# Patient Record
Sex: Female | Born: 1950 | Race: Black or African American | Hispanic: No | Marital: Married | Smoking: Former smoker
Health system: Southern US, Community
[De-identification: ages and names within clinical notes are randomized; demographics above are authoritative.]

## PROBLEM LIST (undated history)

## (undated) DIAGNOSIS — I10 Essential (primary) hypertension: Secondary | ICD-10-CM

## (undated) DIAGNOSIS — N189 Chronic kidney disease, unspecified: Secondary | ICD-10-CM

## (undated) DIAGNOSIS — E039 Hypothyroidism, unspecified: Secondary | ICD-10-CM

## (undated) DIAGNOSIS — E119 Type 2 diabetes mellitus without complications: Secondary | ICD-10-CM

## (undated) DIAGNOSIS — G43009 Migraine without aura, not intractable, without status migrainosus: Secondary | ICD-10-CM

## (undated) DIAGNOSIS — D759 Disease of blood and blood-forming organs, unspecified: Secondary | ICD-10-CM

## (undated) DIAGNOSIS — F519 Sleep disorder not due to a substance or known physiological condition, unspecified: Secondary | ICD-10-CM

## (undated) DIAGNOSIS — Z973 Presence of spectacles and contact lenses: Secondary | ICD-10-CM

## (undated) DIAGNOSIS — N3090 Cystitis, unspecified without hematuria: Secondary | ICD-10-CM

## (undated) DIAGNOSIS — F3289 Other specified depressive episodes: Secondary | ICD-10-CM

## (undated) DIAGNOSIS — F329 Major depressive disorder, single episode, unspecified: Secondary | ICD-10-CM

## (undated) DIAGNOSIS — R059 Cough, unspecified: Secondary | ICD-10-CM

## (undated) DIAGNOSIS — R05 Cough: Secondary | ICD-10-CM

## (undated) DIAGNOSIS — M549 Dorsalgia, unspecified: Secondary | ICD-10-CM

## (undated) DIAGNOSIS — F419 Anxiety disorder, unspecified: Secondary | ICD-10-CM

## (undated) DIAGNOSIS — I499 Cardiac arrhythmia, unspecified: Secondary | ICD-10-CM

## (undated) DIAGNOSIS — K219 Gastro-esophageal reflux disease without esophagitis: Secondary | ICD-10-CM

## (undated) DIAGNOSIS — G8929 Other chronic pain: Secondary | ICD-10-CM

## (undated) DIAGNOSIS — K3184 Gastroparesis: Secondary | ICD-10-CM

## (undated) DIAGNOSIS — K5730 Diverticulosis of large intestine without perforation or abscess without bleeding: Secondary | ICD-10-CM

## (undated) DIAGNOSIS — D509 Iron deficiency anemia, unspecified: Secondary | ICD-10-CM

## (undated) DIAGNOSIS — E785 Hyperlipidemia, unspecified: Secondary | ICD-10-CM

## (undated) DIAGNOSIS — K449 Diaphragmatic hernia without obstruction or gangrene: Secondary | ICD-10-CM

## (undated) DIAGNOSIS — I2510 Atherosclerotic heart disease of native coronary artery without angina pectoris: Secondary | ICD-10-CM

## (undated) DIAGNOSIS — M1990 Unspecified osteoarthritis, unspecified site: Secondary | ICD-10-CM

## (undated) DIAGNOSIS — M5412 Radiculopathy, cervical region: Secondary | ICD-10-CM

## (undated) HISTORY — DX: Diaphragmatic hernia without obstruction or gangrene: K44.9

## (undated) HISTORY — DX: Radiculopathy, cervical region: M54.12

## (undated) HISTORY — DX: Unspecified osteoarthritis, unspecified site: M19.90

## (undated) HISTORY — DX: Anxiety disorder, unspecified: F41.9

## (undated) HISTORY — PX: BREAST EXCISIONAL BIOPSY: SUR124

## (undated) HISTORY — DX: Major depressive disorder, single episode, unspecified: F32.9

## (undated) HISTORY — PX: ABDOMINAL HYSTERECTOMY: SHX81

## (undated) HISTORY — DX: Sleep disorder not due to a substance or known physiological condition, unspecified: F51.9

## (undated) HISTORY — DX: Hyperlipidemia, unspecified: E78.5

## (undated) HISTORY — DX: Iron deficiency anemia, unspecified: D50.9

## (undated) HISTORY — DX: Type 2 diabetes mellitus without complications: E11.9

## (undated) HISTORY — DX: Gastro-esophageal reflux disease without esophagitis: K21.9

## (undated) HISTORY — DX: Hypothyroidism, unspecified: E03.9

## (undated) HISTORY — DX: Gastroparesis: K31.84

## (undated) HISTORY — DX: Other specified depressive episodes: F32.89

## (undated) HISTORY — DX: Migraine without aura, not intractable, without status migrainosus: G43.009

## (undated) HISTORY — PX: ESOPHAGOGASTRODUODENOSCOPY: SHX1529

## (undated) HISTORY — PX: COLONOSCOPY: SHX174

## (undated) HISTORY — PX: EYE SURGERY: SHX253

## (undated) HISTORY — PX: JOINT REPLACEMENT: SHX530

## (undated) HISTORY — DX: Atherosclerotic heart disease of native coronary artery without angina pectoris: I25.10

## (undated) HISTORY — DX: Diverticulosis of large intestine without perforation or abscess without bleeding: K57.30

## (undated) HISTORY — DX: Cystitis, unspecified without hematuria: N30.90

## (undated) HISTORY — PX: OTHER SURGICAL HISTORY: SHX169

## (undated) HISTORY — DX: Essential (primary) hypertension: I10

---

## 1998-02-14 ENCOUNTER — Observation Stay (HOSPITAL_COMMUNITY): Admission: EM | Admit: 1998-02-14 | Discharge: 1998-02-15 | Payer: Self-pay | Admitting: *Deleted

## 1999-02-12 ENCOUNTER — Ambulatory Visit: Admission: RE | Admit: 1999-02-12 | Discharge: 1999-02-12 | Payer: Self-pay | Admitting: Endocrinology

## 1999-10-03 ENCOUNTER — Ambulatory Visit (HOSPITAL_COMMUNITY): Admission: RE | Admit: 1999-10-03 | Discharge: 1999-10-03 | Payer: Self-pay | Admitting: Endocrinology

## 1999-10-03 ENCOUNTER — Encounter: Payer: Self-pay | Admitting: Endocrinology

## 1999-12-31 ENCOUNTER — Encounter: Payer: Self-pay | Admitting: Family Medicine

## 1999-12-31 ENCOUNTER — Inpatient Hospital Stay (HOSPITAL_COMMUNITY): Admission: AD | Admit: 1999-12-31 | Discharge: 2000-01-02 | Payer: Self-pay | Admitting: Family Medicine

## 2000-01-01 ENCOUNTER — Encounter: Payer: Self-pay | Admitting: Family Medicine

## 2000-07-23 ENCOUNTER — Encounter: Payer: Self-pay | Admitting: Orthopedic Surgery

## 2000-07-23 ENCOUNTER — Encounter: Admission: RE | Admit: 2000-07-23 | Discharge: 2000-07-23 | Payer: Self-pay | Admitting: Orthopedic Surgery

## 2001-02-20 ENCOUNTER — Observation Stay (HOSPITAL_COMMUNITY): Admission: EM | Admit: 2001-02-20 | Discharge: 2001-02-21 | Payer: Self-pay | Admitting: Internal Medicine

## 2001-02-20 ENCOUNTER — Encounter: Payer: Self-pay | Admitting: Internal Medicine

## 2002-01-21 ENCOUNTER — Ambulatory Visit: Admission: RE | Admit: 2002-01-21 | Discharge: 2002-01-21 | Payer: Self-pay | Admitting: Orthopedic Surgery

## 2002-02-18 ENCOUNTER — Encounter: Payer: Self-pay | Admitting: Orthopedic Surgery

## 2002-02-23 ENCOUNTER — Inpatient Hospital Stay (HOSPITAL_COMMUNITY): Admission: RE | Admit: 2002-02-23 | Discharge: 2002-03-03 | Payer: Self-pay | Admitting: Orthopedic Surgery

## 2002-02-27 ENCOUNTER — Encounter: Payer: Self-pay | Admitting: Orthopedic Surgery

## 2004-04-13 ENCOUNTER — Encounter: Admission: RE | Admit: 2004-04-13 | Discharge: 2004-04-13 | Payer: Self-pay | Admitting: Orthopedic Surgery

## 2004-05-01 ENCOUNTER — Encounter: Admission: RE | Admit: 2004-05-01 | Discharge: 2004-05-01 | Payer: Self-pay | Admitting: Orthopedic Surgery

## 2004-06-20 ENCOUNTER — Encounter: Admission: RE | Admit: 2004-06-20 | Discharge: 2004-06-20 | Payer: Self-pay | Admitting: Orthopedic Surgery

## 2004-12-04 ENCOUNTER — Ambulatory Visit: Payer: Self-pay | Admitting: *Deleted

## 2004-12-18 ENCOUNTER — Ambulatory Visit: Payer: Self-pay

## 2004-12-25 ENCOUNTER — Ambulatory Visit: Payer: Self-pay

## 2005-01-16 ENCOUNTER — Encounter: Admission: RE | Admit: 2005-01-16 | Discharge: 2005-01-16 | Payer: Self-pay | Admitting: Orthopedic Surgery

## 2005-01-31 ENCOUNTER — Encounter: Admission: RE | Admit: 2005-01-31 | Discharge: 2005-01-31 | Payer: Self-pay | Admitting: Orthopedic Surgery

## 2005-02-27 ENCOUNTER — Encounter: Admission: RE | Admit: 2005-02-27 | Discharge: 2005-02-27 | Payer: Self-pay | Admitting: Orthopedic Surgery

## 2005-10-11 ENCOUNTER — Ambulatory Visit: Payer: Self-pay | Admitting: Internal Medicine

## 2005-11-18 ENCOUNTER — Ambulatory Visit: Payer: Self-pay | Admitting: Internal Medicine

## 2005-12-04 ENCOUNTER — Ambulatory Visit: Payer: Self-pay | Admitting: Endocrinology

## 2005-12-25 ENCOUNTER — Ambulatory Visit: Payer: Self-pay | Admitting: Endocrinology

## 2006-01-02 ENCOUNTER — Ambulatory Visit (HOSPITAL_BASED_OUTPATIENT_CLINIC_OR_DEPARTMENT_OTHER): Admission: RE | Admit: 2006-01-02 | Discharge: 2006-01-02 | Payer: Self-pay | Admitting: Orthopedic Surgery

## 2006-01-16 ENCOUNTER — Ambulatory Visit: Payer: Self-pay | Admitting: Internal Medicine

## 2006-01-24 ENCOUNTER — Ambulatory Visit (HOSPITAL_COMMUNITY): Admission: RE | Admit: 2006-01-24 | Discharge: 2006-01-24 | Payer: Self-pay | Admitting: Internal Medicine

## 2006-04-23 ENCOUNTER — Ambulatory Visit: Payer: Self-pay | Admitting: Internal Medicine

## 2006-07-01 ENCOUNTER — Ambulatory Visit: Payer: Self-pay | Admitting: Internal Medicine

## 2006-07-21 ENCOUNTER — Ambulatory Visit: Payer: Self-pay | Admitting: Internal Medicine

## 2006-10-30 ENCOUNTER — Ambulatory Visit: Payer: Self-pay | Admitting: Internal Medicine

## 2006-11-04 ENCOUNTER — Ambulatory Visit: Payer: Self-pay | Admitting: Family Medicine

## 2006-11-21 ENCOUNTER — Ambulatory Visit: Payer: Self-pay | Admitting: Vascular Surgery

## 2006-12-15 ENCOUNTER — Ambulatory Visit: Payer: Self-pay | Admitting: Internal Medicine

## 2006-12-17 ENCOUNTER — Encounter: Payer: Self-pay | Admitting: Internal Medicine

## 2006-12-17 LAB — CONVERTED CEMR LAB
Metaneph Total, Ur: 223 ug/24hr (ref 95–475)
Metanephrines, Ur: 52 (ref 19–140)
Normetanephrine, 24H Ur: 171 (ref 52–310)
Protein, Ur: 75 mg/24hr (ref 50–100)

## 2007-02-02 ENCOUNTER — Ambulatory Visit (HOSPITAL_COMMUNITY): Admission: RE | Admit: 2007-02-02 | Discharge: 2007-02-02 | Payer: Self-pay | Admitting: Obstetrics

## 2007-02-04 ENCOUNTER — Encounter: Admission: RE | Admit: 2007-02-04 | Discharge: 2007-02-04 | Payer: Self-pay | Admitting: Obstetrics

## 2007-02-24 ENCOUNTER — Inpatient Hospital Stay (HOSPITAL_BASED_OUTPATIENT_CLINIC_OR_DEPARTMENT_OTHER): Admission: RE | Admit: 2007-02-24 | Discharge: 2007-02-24 | Payer: Self-pay | Admitting: Cardiology

## 2007-03-25 ENCOUNTER — Ambulatory Visit: Payer: Self-pay | Admitting: Internal Medicine

## 2007-04-15 ENCOUNTER — Inpatient Hospital Stay (HOSPITAL_COMMUNITY): Admission: RE | Admit: 2007-04-15 | Discharge: 2007-04-20 | Payer: Self-pay | Admitting: Orthopedic Surgery

## 2007-05-06 DIAGNOSIS — I10 Essential (primary) hypertension: Secondary | ICD-10-CM

## 2007-05-06 DIAGNOSIS — E119 Type 2 diabetes mellitus without complications: Secondary | ICD-10-CM | POA: Insufficient documentation

## 2007-05-06 DIAGNOSIS — F411 Generalized anxiety disorder: Secondary | ICD-10-CM | POA: Insufficient documentation

## 2007-05-06 DIAGNOSIS — I152 Hypertension secondary to endocrine disorders: Secondary | ICD-10-CM | POA: Insufficient documentation

## 2007-05-06 DIAGNOSIS — E785 Hyperlipidemia, unspecified: Secondary | ICD-10-CM | POA: Insufficient documentation

## 2007-05-06 DIAGNOSIS — E1159 Type 2 diabetes mellitus with other circulatory complications: Secondary | ICD-10-CM | POA: Insufficient documentation

## 2007-05-06 DIAGNOSIS — I2510 Atherosclerotic heart disease of native coronary artery without angina pectoris: Secondary | ICD-10-CM | POA: Insufficient documentation

## 2007-08-13 ENCOUNTER — Encounter: Payer: Self-pay | Admitting: Internal Medicine

## 2007-08-13 DIAGNOSIS — G43009 Migraine without aura, not intractable, without status migrainosus: Secondary | ICD-10-CM | POA: Insufficient documentation

## 2007-08-13 DIAGNOSIS — D649 Anemia, unspecified: Secondary | ICD-10-CM | POA: Insufficient documentation

## 2007-08-13 DIAGNOSIS — Z96659 Presence of unspecified artificial knee joint: Secondary | ICD-10-CM | POA: Insufficient documentation

## 2007-08-13 DIAGNOSIS — D539 Nutritional anemia, unspecified: Secondary | ICD-10-CM | POA: Insufficient documentation

## 2007-08-13 DIAGNOSIS — N318 Other neuromuscular dysfunction of bladder: Secondary | ICD-10-CM | POA: Insufficient documentation

## 2007-09-01 ENCOUNTER — Ambulatory Visit: Payer: Self-pay | Admitting: Internal Medicine

## 2007-09-01 DIAGNOSIS — R1032 Left lower quadrant pain: Secondary | ICD-10-CM | POA: Insufficient documentation

## 2007-09-01 DIAGNOSIS — F519 Sleep disorder not due to a substance or known physiological condition, unspecified: Secondary | ICD-10-CM | POA: Insufficient documentation

## 2007-09-01 DIAGNOSIS — F329 Major depressive disorder, single episode, unspecified: Secondary | ICD-10-CM | POA: Insufficient documentation

## 2007-09-01 DIAGNOSIS — F32A Depression, unspecified: Secondary | ICD-10-CM | POA: Insufficient documentation

## 2007-09-04 LAB — CONVERTED CEMR LAB
ALT: 31 units/L (ref 0–35)
AST: 19 units/L (ref 0–37)
Albumin: 3.3 g/dL — ABNORMAL LOW (ref 3.5–5.2)
Alkaline Phosphatase: 121 units/L — ABNORMAL HIGH (ref 39–117)
BUN: 23 mg/dL (ref 6–23)
Basophils Absolute: 0 10*3/uL (ref 0.0–0.1)
Basophils Relative: 0 % (ref 0.0–1.0)
Bilirubin Urine: NEGATIVE
Bilirubin, Direct: 0.1 mg/dL (ref 0.0–0.3)
CO2: 25 meq/L (ref 19–32)
Calcium: 9.4 mg/dL (ref 8.4–10.5)
Chloride: 105 meq/L (ref 96–112)
Cholesterol: 223 mg/dL (ref 0–200)
Creatinine, Ser: 1.1 mg/dL (ref 0.4–1.2)
Crystals: NEGATIVE
Direct LDL: 153 mg/dL
Eosinophils Absolute: 0.4 10*3/uL (ref 0.0–0.6)
Eosinophils Relative: 3.1 % (ref 0.0–5.0)
GFR calc Af Amer: 66 mL/min
GFR calc non Af Amer: 54 mL/min
Glucose, Bld: 147 mg/dL — ABNORMAL HIGH (ref 70–99)
HCT: 26.8 % — ABNORMAL LOW (ref 36.0–46.0)
HDL: 49.1 mg/dL (ref >39.0)
Hemoglobin, Urine: NEGATIVE
Hemoglobin: 9.1 g/dL — ABNORMAL LOW (ref 12.0–15.0)
Hgb A1c MFr Bld: 6.3 % — ABNORMAL HIGH (ref 4.6–6.0)
Ketones, ur: NEGATIVE mg/dL
Leukocytes, UA: NEGATIVE
Lipase: 33 units/L (ref 11.0–59.0)
Lymphocytes Relative: 22.4 % (ref 12.0–46.0)
MCHC: 34.1 g/dL (ref 30.0–36.0)
MCV: 91.6 fL (ref 78.0–100.0)
Monocytes Absolute: 0.3 10*3/uL (ref 0.2–0.7)
Monocytes Relative: 2.5 % — ABNORMAL LOW (ref 3.0–11.0)
Mucus, UA: NEGATIVE
Neutro Abs: 9.1 10*3/uL — ABNORMAL HIGH (ref 1.4–7.7)
Neutrophils Relative %: 72 % (ref 43.0–77.0)
Nitrite: NEGATIVE
Platelets: 367 10*3/uL (ref 150–400)
Potassium: 4.4 meq/L (ref 3.5–5.1)
RBC / HPF: NONE SEEN
RBC: 2.93 M/uL — ABNORMAL LOW (ref 3.87–5.11)
RDW: 12.6 % (ref 11.5–14.6)
Sodium: 139 meq/L (ref 135–145)
Specific Gravity, Urine: 1.02 (ref 1.000–1.03)
Total Bilirubin: 0.7 mg/dL (ref 0.3–1.2)
Total CHOL/HDL Ratio: 4.5
Total Protein, Urine: NEGATIVE mg/dL
Total Protein: 6.7 g/dL (ref 6.0–8.3)
Triglycerides: 146 mg/dL (ref 0–149)
Urine Glucose: NEGATIVE mg/dL
Urobilinogen, UA: 0.2 (ref 0.0–1.0)
VLDL: 29 mg/dL (ref 0–40)
WBC: 12.6 10*3/uL — ABNORMAL HIGH (ref 4.5–10.5)
pH: 6 (ref 5.0–8.0)

## 2007-09-07 ENCOUNTER — Ambulatory Visit: Payer: Self-pay | Admitting: Internal Medicine

## 2007-09-08 ENCOUNTER — Encounter (HOSPITAL_COMMUNITY): Admission: RE | Admit: 2007-09-08 | Discharge: 2007-10-16 | Payer: Self-pay | Admitting: Internal Medicine

## 2007-09-08 LAB — CONVERTED CEMR LAB
Basophils Absolute: 0.1 10*3/uL (ref 0.0–0.1)
Basophils Relative: 0.7 % (ref 0.0–1.0)
Eosinophils Absolute: 0.5 10*3/uL (ref 0.0–0.6)
Eosinophils Relative: 4.4 % (ref 0.0–5.0)
HCT: 23.2 % — CL (ref 36.0–46.0)
Hemoglobin: 7.8 g/dL — CL (ref 12.0–15.0)
Iron: 33 ug/dL — ABNORMAL LOW (ref 42–145)
Lymphocytes Relative: 23.3 % (ref 12.0–46.0)
MCHC: 33.5 g/dL (ref 30.0–36.0)
MCV: 92.6 fL (ref 78.0–100.0)
Monocytes Absolute: 0.3 10*3/uL (ref 0.2–0.7)
Monocytes Relative: 2.7 % — ABNORMAL LOW (ref 3.0–11.0)
Neutro Abs: 8 10*3/uL — ABNORMAL HIGH (ref 1.4–7.7)
Neutrophils Relative %: 68.9 % (ref 43.0–77.0)
Platelets: 417 10*3/uL — ABNORMAL HIGH (ref 150–400)
RBC: 2.51 M/uL — ABNORMAL LOW (ref 3.87–5.11)
RDW: 13.1 % (ref 11.5–14.6)
Transferrin: 286.3 mg/dL (ref >=212.0)
Vitamin B-12: 521 pg/mL (ref 211–911)
WBC: 11.6 10*3/uL — ABNORMAL HIGH (ref 4.5–10.5)

## 2007-09-14 ENCOUNTER — Telehealth (INDEPENDENT_AMBULATORY_CARE_PROVIDER_SITE_OTHER): Payer: Self-pay | Admitting: *Deleted

## 2007-09-14 ENCOUNTER — Encounter: Payer: Self-pay | Admitting: Internal Medicine

## 2007-09-14 ENCOUNTER — Ambulatory Visit: Payer: Self-pay | Admitting: Gastroenterology

## 2007-09-15 ENCOUNTER — Ambulatory Visit: Payer: Self-pay | Admitting: Cardiology

## 2007-09-17 ENCOUNTER — Encounter: Payer: Self-pay | Admitting: Internal Medicine

## 2007-09-17 ENCOUNTER — Ambulatory Visit: Payer: Self-pay | Admitting: Gastroenterology

## 2007-09-23 ENCOUNTER — Ambulatory Visit (HOSPITAL_COMMUNITY): Admission: RE | Admit: 2007-09-23 | Discharge: 2007-09-23 | Payer: Self-pay | Admitting: Gastroenterology

## 2007-09-23 ENCOUNTER — Encounter: Payer: Self-pay | Admitting: Gastroenterology

## 2007-09-23 ENCOUNTER — Encounter: Payer: Self-pay | Admitting: Internal Medicine

## 2007-09-29 ENCOUNTER — Ambulatory Visit: Payer: Self-pay | Admitting: Gastroenterology

## 2007-10-02 ENCOUNTER — Encounter: Admission: RE | Admit: 2007-10-02 | Discharge: 2007-10-02 | Payer: Self-pay | Admitting: Surgery

## 2007-10-07 ENCOUNTER — Encounter: Payer: Self-pay | Admitting: Internal Medicine

## 2007-10-09 ENCOUNTER — Emergency Department (HOSPITAL_COMMUNITY): Admission: EM | Admit: 2007-10-09 | Discharge: 2007-10-10 | Payer: Self-pay | Admitting: Emergency Medicine

## 2007-10-12 ENCOUNTER — Ambulatory Visit: Payer: Self-pay | Admitting: Internal Medicine

## 2007-10-12 ENCOUNTER — Encounter (HOSPITAL_COMMUNITY): Admission: RE | Admit: 2007-10-12 | Discharge: 2007-10-16 | Payer: Self-pay | Admitting: Cardiology

## 2007-10-12 DIAGNOSIS — T148XXA Other injury of unspecified body region, initial encounter: Secondary | ICD-10-CM | POA: Insufficient documentation

## 2007-10-12 DIAGNOSIS — T07XXXA Unspecified multiple injuries, initial encounter: Secondary | ICD-10-CM | POA: Insufficient documentation

## 2007-10-12 DIAGNOSIS — M25519 Pain in unspecified shoulder: Secondary | ICD-10-CM | POA: Insufficient documentation

## 2007-10-12 DIAGNOSIS — M25469 Effusion, unspecified knee: Secondary | ICD-10-CM | POA: Insufficient documentation

## 2007-10-28 ENCOUNTER — Encounter: Payer: Self-pay | Admitting: Internal Medicine

## 2007-11-08 HISTORY — PX: OTHER SURGICAL HISTORY: SHX169

## 2007-11-27 ENCOUNTER — Ambulatory Visit: Payer: Self-pay | Admitting: Internal Medicine

## 2007-11-27 ENCOUNTER — Encounter (INDEPENDENT_AMBULATORY_CARE_PROVIDER_SITE_OTHER): Payer: Self-pay | Admitting: Surgery

## 2007-11-27 ENCOUNTER — Inpatient Hospital Stay (HOSPITAL_COMMUNITY): Admission: RE | Admit: 2007-11-27 | Discharge: 2007-12-11 | Payer: Self-pay | Admitting: Surgery

## 2007-12-07 ENCOUNTER — Ambulatory Visit: Payer: Self-pay | Admitting: Gastroenterology

## 2007-12-21 ENCOUNTER — Ambulatory Visit: Payer: Self-pay | Admitting: Internal Medicine

## 2007-12-21 DIAGNOSIS — R1084 Generalized abdominal pain: Secondary | ICD-10-CM | POA: Insufficient documentation

## 2007-12-21 LAB — CONVERTED CEMR LAB
ALT: 18 units/L (ref 0–35)
AST: 18 units/L (ref 0–37)
Albumin: 3.6 g/dL (ref 3.5–5.2)
Alkaline Phosphatase: 115 units/L (ref 39–117)
BUN: 7 mg/dL (ref 6–23)
Basophils Absolute: 0.1 10*3/uL (ref 0.0–0.1)
Basophils Relative: 0.8 % (ref 0.0–1.0)
Bilirubin Urine: NEGATIVE
Bilirubin, Direct: 0.2 mg/dL (ref 0.0–0.3)
CO2: 29 meq/L (ref 19–32)
Calcium: 9.7 mg/dL (ref 8.4–10.5)
Chloride: 105 meq/L (ref 96–112)
Creatinine, Ser: 1 mg/dL (ref 0.4–1.2)
Eosinophils Absolute: 0.3 10*3/uL (ref 0.0–0.6)
Eosinophils Relative: 3.8 % (ref 0.0–5.0)
GFR calc Af Amer: 73 mL/min
GFR calc non Af Amer: 61 mL/min
Glucose, Bld: 180 mg/dL — ABNORMAL HIGH (ref 70–99)
HCT: 30.9 % — ABNORMAL LOW (ref 36.0–46.0)
Hemoglobin, Urine: NEGATIVE
Hemoglobin: 10 g/dL — ABNORMAL LOW (ref 12.0–15.0)
Ketones, ur: NEGATIVE mg/dL
Leukocytes, UA: NEGATIVE
Lymphocytes Relative: 27.8 % (ref 12.0–46.0)
MCHC: 32.5 g/dL (ref 30.0–36.0)
MCV: 93.2 fL (ref 78.0–100.0)
Monocytes Absolute: 0.3 10*3/uL (ref 0.2–0.7)
Monocytes Relative: 4.1 % (ref 3.0–11.0)
Neutro Abs: 5.1 10*3/uL (ref 1.4–7.7)
Neutrophils Relative %: 63.5 % (ref 43.0–77.0)
Nitrite: NEGATIVE
Platelets: 424 10*3/uL — ABNORMAL HIGH (ref 150–400)
Potassium: 4 meq/L (ref 3.5–5.1)
RBC: 3.31 M/uL — ABNORMAL LOW (ref 3.87–5.11)
RDW: 15.5 % — ABNORMAL HIGH (ref 11.5–14.6)
Sodium: 141 meq/L (ref 135–145)
Specific Gravity, Urine: 1.015 (ref 1.000–1.03)
Total Bilirubin: 1 mg/dL (ref 0.3–1.2)
Total Protein, Urine: NEGATIVE mg/dL
Total Protein: 7.7 g/dL (ref 6.0–8.3)
Urine Glucose: NEGATIVE mg/dL
Urobilinogen, UA: 0.2 (ref 0.0–1.0)
WBC: 8 10*3/uL (ref 4.5–10.5)
pH: 7 (ref 5.0–8.0)

## 2007-12-29 ENCOUNTER — Encounter: Payer: Self-pay | Admitting: Internal Medicine

## 2007-12-30 ENCOUNTER — Encounter: Payer: Self-pay | Admitting: Internal Medicine

## 2008-01-05 ENCOUNTER — Ambulatory Visit: Payer: Self-pay | Admitting: Gastroenterology

## 2008-01-28 ENCOUNTER — Encounter: Admission: RE | Admit: 2008-01-28 | Discharge: 2008-01-28 | Payer: Self-pay | Admitting: Orthopedic Surgery

## 2008-02-01 ENCOUNTER — Encounter: Admission: RE | Admit: 2008-02-01 | Discharge: 2008-02-01 | Payer: Self-pay | Admitting: Surgery

## 2008-03-07 ENCOUNTER — Encounter: Payer: Self-pay | Admitting: Internal Medicine

## 2008-03-10 ENCOUNTER — Encounter: Payer: Self-pay | Admitting: Gastroenterology

## 2008-04-06 HISTORY — PX: OTHER SURGICAL HISTORY: SHX169

## 2008-04-13 ENCOUNTER — Telehealth: Payer: Self-pay | Admitting: Gastroenterology

## 2008-04-15 ENCOUNTER — Ambulatory Visit (HOSPITAL_COMMUNITY): Admission: RE | Admit: 2008-04-15 | Discharge: 2008-04-15 | Payer: Self-pay | Admitting: Gastroenterology

## 2008-04-18 ENCOUNTER — Encounter: Payer: Self-pay | Admitting: Gastroenterology

## 2008-04-21 ENCOUNTER — Encounter: Payer: Self-pay | Admitting: Internal Medicine

## 2008-04-21 ENCOUNTER — Encounter: Payer: Self-pay | Admitting: Gastroenterology

## 2008-04-25 ENCOUNTER — Inpatient Hospital Stay (HOSPITAL_COMMUNITY): Admission: RE | Admit: 2008-04-25 | Discharge: 2008-04-29 | Payer: Self-pay | Admitting: Orthopedic Surgery

## 2008-05-10 ENCOUNTER — Telehealth (INDEPENDENT_AMBULATORY_CARE_PROVIDER_SITE_OTHER): Payer: Self-pay | Admitting: *Deleted

## 2008-06-17 ENCOUNTER — Telehealth (INDEPENDENT_AMBULATORY_CARE_PROVIDER_SITE_OTHER): Payer: Self-pay | Admitting: *Deleted

## 2008-06-29 ENCOUNTER — Ambulatory Visit: Payer: Self-pay | Admitting: Internal Medicine

## 2008-06-29 DIAGNOSIS — N959 Unspecified menopausal and perimenopausal disorder: Secondary | ICD-10-CM | POA: Insufficient documentation

## 2008-07-11 ENCOUNTER — Encounter: Payer: Self-pay | Admitting: Internal Medicine

## 2008-07-14 ENCOUNTER — Ambulatory Visit: Payer: Self-pay | Admitting: Internal Medicine

## 2008-07-15 ENCOUNTER — Encounter: Payer: Self-pay | Admitting: Internal Medicine

## 2008-08-17 ENCOUNTER — Encounter: Admission: RE | Admit: 2008-08-17 | Discharge: 2008-08-17 | Payer: Self-pay | Admitting: Surgery

## 2008-09-23 ENCOUNTER — Encounter: Payer: Self-pay | Admitting: Internal Medicine

## 2008-10-28 ENCOUNTER — Ambulatory Visit: Payer: Self-pay | Admitting: Internal Medicine

## 2008-10-28 DIAGNOSIS — M5412 Radiculopathy, cervical region: Secondary | ICD-10-CM | POA: Insufficient documentation

## 2008-11-21 ENCOUNTER — Encounter: Payer: Self-pay | Admitting: Internal Medicine

## 2008-12-05 ENCOUNTER — Encounter (INDEPENDENT_AMBULATORY_CARE_PROVIDER_SITE_OTHER): Payer: Self-pay | Admitting: *Deleted

## 2008-12-12 ENCOUNTER — Encounter: Admission: RE | Admit: 2008-12-12 | Discharge: 2008-12-12 | Payer: Self-pay | Admitting: Orthopedic Surgery

## 2008-12-12 ENCOUNTER — Encounter: Admission: RE | Admit: 2008-12-12 | Discharge: 2008-12-12 | Payer: Self-pay | Admitting: Internal Medicine

## 2008-12-12 ENCOUNTER — Encounter: Payer: Self-pay | Admitting: Internal Medicine

## 2008-12-15 ENCOUNTER — Encounter: Payer: Self-pay | Admitting: Internal Medicine

## 2009-01-02 ENCOUNTER — Encounter: Payer: Self-pay | Admitting: Internal Medicine

## 2009-01-05 HISTORY — PX: ROTATOR CUFF REPAIR: SHX139

## 2009-02-13 ENCOUNTER — Ambulatory Visit: Payer: Self-pay | Admitting: Internal Medicine

## 2009-02-14 LAB — CONVERTED CEMR LAB
ALT: 29 units/L (ref 0–35)
AST: 33 units/L (ref 0–37)
Albumin: 3.4 g/dL — ABNORMAL LOW (ref 3.5–5.2)
Alkaline Phosphatase: 90 units/L (ref 39–117)
BUN: 33 mg/dL — ABNORMAL HIGH (ref 6–23)
Basophils Absolute: 0 10*3/uL (ref 0.0–0.1)
Basophils Relative: 0.2 % (ref 0.0–3.0)
Bilirubin Urine: NEGATIVE
Bilirubin, Direct: 0.7 mg/dL — ABNORMAL HIGH (ref 0.0–0.3)
CO2: 24 meq/L (ref 19–32)
Calcium: 9.2 mg/dL (ref 8.4–10.5)
Chloride: 111 meq/L (ref 96–112)
Cholesterol: 204 mg/dL — ABNORMAL HIGH (ref 0–200)
Creatinine, Ser: 1.2 mg/dL (ref 0.4–1.2)
Creatinine,U: 149.1 mg/dL
Direct LDL: 139.2 mg/dL
Eosinophils Absolute: 0.3 10*3/uL (ref 0.0–0.7)
Eosinophils Relative: 3 % (ref 0.0–5.0)
GFR calc non Af Amer: 59.14 mL/min (ref >60)
Glucose, Bld: 177 mg/dL — ABNORMAL HIGH (ref 70–99)
HCT: 31.1 % — ABNORMAL LOW (ref 36.0–46.0)
HDL: 48.3 mg/dL (ref >39.00)
Hemoglobin, Urine: NEGATIVE
Hemoglobin: 10.3 g/dL — ABNORMAL LOW (ref 12.0–15.0)
Hgb A1c MFr Bld: 6.8 % — ABNORMAL HIGH (ref 4.6–6.5)
Ketones, ur: NEGATIVE mg/dL
Leukocytes, UA: NEGATIVE
Lymphocytes Relative: 26 % (ref 12.0–46.0)
Lymphs Abs: 2.2 10*3/uL (ref 0.7–4.0)
MCHC: 33.1 g/dL (ref 30.0–36.0)
MCV: 93.8 fL (ref 78.0–100.0)
Microalb Creat Ratio: 2 mg/g (ref 0.0–30.0)
Microalb, Ur: 0.3 mg/dL (ref 0.0–1.9)
Monocytes Absolute: 0.3 10*3/uL (ref 0.1–1.0)
Monocytes Relative: 3.1 % (ref 3.0–12.0)
Neutro Abs: 5.8 10*3/uL (ref 1.4–7.7)
Neutrophils Relative %: 67.7 % (ref 43.0–77.0)
Nitrite: NEGATIVE
Platelets: 219 10*3/uL (ref 150.0–400.0)
Potassium: 4.9 meq/L (ref 3.5–5.1)
RBC: 3.31 M/uL — ABNORMAL LOW (ref 3.87–5.11)
RDW: 12.5 % (ref 11.5–14.6)
Sodium: 146 meq/L — ABNORMAL HIGH (ref 135–145)
Specific Gravity, Urine: 1.025 (ref 1.000–1.030)
TSH: 0.77 microintl units/mL (ref 0.35–5.50)
Total Bilirubin: 1.4 mg/dL — ABNORMAL HIGH (ref 0.3–1.2)
Total CHOL/HDL Ratio: 4
Total Protein, Urine: NEGATIVE mg/dL
Total Protein: 6.9 g/dL (ref 6.0–8.3)
Triglycerides: 94 mg/dL (ref 0.0–149.0)
Urine Glucose: NEGATIVE mg/dL
Urobilinogen, UA: 0.2 (ref 0.0–1.0)
VLDL: 18.8 mg/dL (ref 0.0–40.0)
WBC: 8.6 10*3/uL (ref 4.5–10.5)
pH: 6 (ref 5.0–8.0)

## 2009-02-23 ENCOUNTER — Encounter: Payer: Self-pay | Admitting: Internal Medicine

## 2009-03-10 ENCOUNTER — Telehealth: Payer: Self-pay | Admitting: Gastroenterology

## 2009-03-15 ENCOUNTER — Ambulatory Visit: Payer: Self-pay | Admitting: Internal Medicine

## 2009-03-15 DIAGNOSIS — R12 Heartburn: Secondary | ICD-10-CM | POA: Insufficient documentation

## 2009-03-15 DIAGNOSIS — R110 Nausea: Secondary | ICD-10-CM | POA: Insufficient documentation

## 2009-03-15 DIAGNOSIS — R1013 Epigastric pain: Secondary | ICD-10-CM | POA: Insufficient documentation

## 2009-03-15 DIAGNOSIS — K259 Gastric ulcer, unspecified as acute or chronic, without hemorrhage or perforation: Secondary | ICD-10-CM | POA: Insufficient documentation

## 2009-03-15 DIAGNOSIS — R112 Nausea with vomiting, unspecified: Secondary | ICD-10-CM | POA: Insufficient documentation

## 2009-03-15 DIAGNOSIS — K5730 Diverticulosis of large intestine without perforation or abscess without bleeding: Secondary | ICD-10-CM | POA: Insufficient documentation

## 2009-03-15 LAB — CONVERTED CEMR LAB
BUN: 18 mg/dL (ref 6–23)
Basophils Absolute: 0 10*3/uL (ref 0.0–0.1)
Basophils Relative: 0.2 % (ref 0.0–3.0)
CO2: 24 meq/L (ref 19–32)
Calcium: 8.8 mg/dL (ref 8.4–10.5)
Chloride: 114 meq/L — ABNORMAL HIGH (ref 96–112)
Creatinine, Ser: 0.9 mg/dL (ref 0.4–1.2)
Eosinophils Absolute: 0.2 10*3/uL (ref 0.0–0.7)
Eosinophils Relative: 3.4 % (ref 0.0–5.0)
GFR calc non Af Amer: 82.4 mL/min (ref >60)
Glucose, Bld: 191 mg/dL — ABNORMAL HIGH (ref 70–99)
HCT: 31.5 % — ABNORMAL LOW (ref 36.0–46.0)
Hemoglobin: 10.7 g/dL — ABNORMAL LOW (ref 12.0–15.0)
Lymphocytes Relative: 26.4 % (ref 12.0–46.0)
Lymphs Abs: 1.8 10*3/uL (ref 0.7–4.0)
MCHC: 34.1 g/dL (ref 30.0–36.0)
MCV: 95.4 fL (ref 78.0–100.0)
Monocytes Absolute: 0.3 10*3/uL (ref 0.1–1.0)
Monocytes Relative: 4.1 % (ref 3.0–12.0)
Neutro Abs: 4.7 10*3/uL (ref 1.4–7.7)
Neutrophils Relative %: 65.9 % (ref 43.0–77.0)
Platelets: 280 10*3/uL (ref 150.0–400.0)
Potassium: 4 meq/L (ref 3.5–5.1)
RBC: 3.31 M/uL — ABNORMAL LOW (ref 3.87–5.11)
RDW: 12.9 % (ref 11.5–14.6)
Sodium: 147 meq/L — ABNORMAL HIGH (ref 135–145)
WBC: 7 10*3/uL (ref 4.5–10.5)

## 2009-03-16 ENCOUNTER — Ambulatory Visit: Payer: Self-pay | Admitting: Internal Medicine

## 2009-03-17 LAB — CONVERTED CEMR LAB
Ferritin: 21.5 ng/mL (ref 10.0–291.0)
Folate: 9.7 ng/mL
Iron: 65 ug/dL (ref 42–145)
Saturation Ratios: 18.8 % — ABNORMAL LOW (ref 20.0–50.0)
Transferrin: 246.8 mg/dL (ref 212.0–360.0)
Vitamin B-12: 556 pg/mL (ref 211–911)

## 2009-04-26 ENCOUNTER — Ambulatory Visit: Payer: Self-pay | Admitting: Gastroenterology

## 2009-04-26 DIAGNOSIS — K3184 Gastroparesis: Secondary | ICD-10-CM | POA: Insufficient documentation

## 2009-04-26 DIAGNOSIS — K219 Gastro-esophageal reflux disease without esophagitis: Secondary | ICD-10-CM | POA: Insufficient documentation

## 2009-05-02 ENCOUNTER — Telehealth: Payer: Self-pay | Admitting: Gastroenterology

## 2009-05-02 ENCOUNTER — Ambulatory Visit: Payer: Self-pay | Admitting: Gastroenterology

## 2009-05-03 LAB — CONVERTED CEMR LAB
BUN: 25 mg/dL — ABNORMAL HIGH (ref 6–23)
Creatinine, Ser: 1 mg/dL (ref 0.4–1.2)
Ferritin: 25.3 ng/mL (ref 10.0–291.0)
Folate: 11.5 ng/mL
Iron: 79 ug/dL (ref 42–145)
Saturation Ratios: 24.5 % (ref 20.0–50.0)
TSH: 0.74 microintl units/mL (ref 0.35–5.50)
Transferrin: 230.1 mg/dL (ref 212.0–360.0)
Vitamin B-12: 598 pg/mL (ref 211–911)

## 2009-05-04 ENCOUNTER — Ambulatory Visit: Payer: Self-pay | Admitting: Cardiovascular Disease

## 2009-05-04 DIAGNOSIS — K8020 Calculus of gallbladder without cholecystitis without obstruction: Secondary | ICD-10-CM | POA: Insufficient documentation

## 2009-05-10 ENCOUNTER — Ambulatory Visit (HOSPITAL_COMMUNITY): Admission: RE | Admit: 2009-05-10 | Discharge: 2009-05-10 | Payer: Self-pay | Admitting: Gastroenterology

## 2009-05-11 ENCOUNTER — Telehealth: Payer: Self-pay | Admitting: Gastroenterology

## 2009-06-07 ENCOUNTER — Encounter: Payer: Self-pay | Admitting: Internal Medicine

## 2009-06-07 HISTORY — PX: CHOLECYSTECTOMY: SHX55

## 2009-06-14 ENCOUNTER — Encounter (INDEPENDENT_AMBULATORY_CARE_PROVIDER_SITE_OTHER): Payer: Self-pay | Admitting: Surgery

## 2009-06-14 ENCOUNTER — Ambulatory Visit (HOSPITAL_COMMUNITY): Admission: RE | Admit: 2009-06-14 | Discharge: 2009-06-15 | Payer: Self-pay | Admitting: Surgery

## 2009-06-14 ENCOUNTER — Encounter (INDEPENDENT_AMBULATORY_CARE_PROVIDER_SITE_OTHER): Payer: Self-pay | Admitting: *Deleted

## 2009-08-03 ENCOUNTER — Encounter: Payer: Self-pay | Admitting: Gastroenterology

## 2009-08-04 ENCOUNTER — Telehealth: Payer: Self-pay | Admitting: Gastroenterology

## 2009-09-06 HISTORY — PX: OTHER SURGICAL HISTORY: SHX169

## 2009-09-08 ENCOUNTER — Encounter (INDEPENDENT_AMBULATORY_CARE_PROVIDER_SITE_OTHER): Payer: Self-pay | Admitting: *Deleted

## 2009-09-08 ENCOUNTER — Ambulatory Visit (HOSPITAL_COMMUNITY): Admission: RE | Admit: 2009-09-08 | Discharge: 2009-09-08 | Payer: Self-pay | Admitting: Surgery

## 2009-09-11 ENCOUNTER — Telehealth: Payer: Self-pay | Admitting: Internal Medicine

## 2009-09-11 ENCOUNTER — Emergency Department (HOSPITAL_COMMUNITY): Admission: EM | Admit: 2009-09-11 | Discharge: 2009-09-12 | Payer: Self-pay | Admitting: Emergency Medicine

## 2009-09-11 ENCOUNTER — Telehealth: Payer: Self-pay | Admitting: Gastroenterology

## 2009-09-13 ENCOUNTER — Ambulatory Visit: Payer: Self-pay | Admitting: Gastroenterology

## 2009-09-13 DIAGNOSIS — R109 Unspecified abdominal pain: Secondary | ICD-10-CM | POA: Insufficient documentation

## 2009-09-13 DIAGNOSIS — R198 Other specified symptoms and signs involving the digestive system and abdomen: Secondary | ICD-10-CM | POA: Insufficient documentation

## 2009-09-18 ENCOUNTER — Ambulatory Visit (HOSPITAL_COMMUNITY): Admission: RE | Admit: 2009-09-18 | Discharge: 2009-09-18 | Payer: Self-pay | Admitting: Gastroenterology

## 2009-10-07 LAB — HM MAMMOGRAPHY: HM Mammogram: NORMAL

## 2009-10-18 ENCOUNTER — Ambulatory Visit: Payer: Self-pay | Admitting: Gastroenterology

## 2009-10-19 DIAGNOSIS — R300 Dysuria: Secondary | ICD-10-CM | POA: Insufficient documentation

## 2009-10-20 ENCOUNTER — Encounter: Payer: Self-pay | Admitting: Gastroenterology

## 2009-10-24 ENCOUNTER — Ambulatory Visit (HOSPITAL_COMMUNITY): Admission: RE | Admit: 2009-10-24 | Discharge: 2009-10-24 | Payer: Self-pay | Admitting: Cardiology

## 2009-11-03 ENCOUNTER — Ambulatory Visit (HOSPITAL_BASED_OUTPATIENT_CLINIC_OR_DEPARTMENT_OTHER): Admission: RE | Admit: 2009-11-03 | Discharge: 2009-11-03 | Payer: Self-pay | Admitting: Urology

## 2009-11-06 ENCOUNTER — Telehealth: Payer: Self-pay | Admitting: Internal Medicine

## 2010-02-13 ENCOUNTER — Encounter: Payer: Self-pay | Admitting: Gastroenterology

## 2010-02-28 ENCOUNTER — Telehealth: Payer: Self-pay | Admitting: Internal Medicine

## 2010-03-23 ENCOUNTER — Encounter: Payer: Self-pay | Admitting: Gastroenterology

## 2010-05-01 ENCOUNTER — Ambulatory Visit: Payer: Self-pay | Admitting: Internal Medicine

## 2010-05-01 ENCOUNTER — Telehealth: Payer: Self-pay | Admitting: Internal Medicine

## 2010-05-04 ENCOUNTER — Encounter: Payer: Self-pay | Admitting: Gastroenterology

## 2010-05-04 ENCOUNTER — Telehealth: Payer: Self-pay | Admitting: Internal Medicine

## 2010-05-09 ENCOUNTER — Encounter: Payer: Self-pay | Admitting: Internal Medicine

## 2010-06-07 ENCOUNTER — Encounter: Payer: Self-pay | Admitting: Gastroenterology

## 2010-06-28 ENCOUNTER — Ambulatory Visit: Payer: Self-pay | Admitting: Gastroenterology

## 2010-07-12 ENCOUNTER — Encounter: Payer: Self-pay | Admitting: Gastroenterology

## 2010-08-25 ENCOUNTER — Emergency Department (HOSPITAL_COMMUNITY)
Admission: EM | Admit: 2010-08-25 | Discharge: 2010-08-25 | Payer: Self-pay | Source: Home / Self Care | Admitting: Family Medicine

## 2010-10-28 ENCOUNTER — Encounter: Payer: Self-pay | Admitting: Gastroenterology

## 2010-10-28 ENCOUNTER — Encounter: Payer: Self-pay | Admitting: Internal Medicine

## 2010-10-29 ENCOUNTER — Encounter: Payer: Self-pay | Admitting: Orthopedic Surgery

## 2010-11-06 NOTE — Assessment & Plan Note (Signed)
Summary: follow up per [facility]hospital   Vital Signs:  Patient Profile:   60 Years Old Female Weight:      261 pounds Temp:     98.8 degrees F Pulse rate:   86 / minute BP sitting:   183 / 89  (right arm) Cuff size:   regular  Pt. in pain?   no  Vitals Entered By: Elveria Royals (December 21, 2007 2:05 PM)                  Chief Complaint:  HFU.  History of Present Illness: with g-tube while hospd; to f/u dr Lois Huxley in 2 wks; discharged mar 6 and overall still on liquids and current meds; had GI bleed while there ; still with dark stools and weakness; still with abd pains more in the mid and upper abd; did vomit several times with sour stomach, and does not seem to digest well; is taking the protonix; overall has lost some wt; sugars seem high - out of insulin for sliding scale; has to crush all pills; d/c hgb 7.2    Updated Prior Medication List: FLUOXETINE HCL 40 MG  CAPS (FLUOXETINE HCL) 1 by mouth qd PREMARIN 1.25 MG TABS (ESTROGENS CONJUGATED) 1 by mouth qd METFORMIN HCL 500 MG TB24 (METFORMIN HCL) 1 by mouth bid LIPITOR 80 MG  TABS (ATORVASTATIN CALCIUM) 1 by mouth qd CLIMARA 0.05 MG/24HR  PTWK (ESTRADIOL) 1 patch q wk VESICARE 5 MG  TABS (SOLIFENACIN SUCCINATE) 1 by mouth qd ISOSORBIDE DINITRATE 10 MG  TABS (ISOSORBIDE DINITRATE) 1 by mouth bid METOPROLOL TARTRATE 100 MG  TABS (METOPROLOL TARTRATE) 1 by mouth bid CLONIDINE HCL 0.1 MG  TABS (CLONIDINE HCL) 1 by mouth bid AZOR 10-40 MG  TABS (AMLODIPINE-OLMESARTAN) 1 by mouth qd JANUVIA 100 MG  TABS (SITAGLIPTIN PHOSPHATE) 1 by mouth qd VICODIN HP 10-660 MG  TABS (HYDROCODONE-ACETAMINOPHEN) 1 by mouth qid prn ECOTRIN LOW STRENGTH 81 MG  TBEC (ASPIRIN) 1po qd FIORICET 50-325-40 MG  TABS (BUTALBITAL-APAP-CAFFEINE) 1 by mouth qd ZOLPIDEM TARTRATE 10 MG  TABS (ZOLPIDEM TARTRATE) 1 by mouth at bedtime prn FLEXERIL 5 MG  TABS (CYCLOBENZAPRINE HCL) 1po three times a day prn CELEBREX 200 MG  CAPS (CELECOXIB) 1 by  mouth qd ZOLOFT 50 MG  TABS (SERTRALINE HCL) 1 by mouth qd NOVOLOG FLEXPEN 100 UNIT/ML  SOLN (INSULIN ASPART) use asd qid sliding scale * NOVOLOG FLEXPEN NEEDLE use asd qid with sliding scale PROMETHAZINE HCL 25 MG  TABS (PROMETHAZINE HCL) 1 by mouth q 6 hrs prn  Current Allergies (reviewed today): ! PCN  Past Medical History:    Reviewed history from 09/01/2007 and no changes required:       Anxiety       Coronary artery disease       Diabetes mellitus, type II       Hyperlipidemia       Hypertension       morbid obesity       migraine       OAB       Anemia-NOS       hx of pos PPD       Depression  Past Surgical History:    Reviewed history from 09/01/2007 and no changes required:       s/p left knee replacement       s/p right knee replacement   Social History:    Reviewed history from 08/13/2007 and no changes required:  Former Smoker       Alcohol use-no       Married     Physical Exam  General:     appears fatigued - ? how much psych related Head:     Normocephalic and atraumatic without obvious abnormalities. No apparent alopecia or balding. Eyes:     No corneal or conjunctival inflammation noted. EOMI. Perrla. Ears:     External ear exam shows no significant lesions or deformities.  Otoscopic examination reveals clear canals, tympanic membranes are intact bilaterally without bulging, retraction, inflammation or discharge. Hearing is grossly normal bilaterally. Nose:     External nasal examination shows no deformity or inflammation. Nasal mucosa are pink and moist without lesions or exudates. Mouth:     Oral mucosa and oropharynx without lesions or exudates.  Teeth in good repair. Neck:     No deformities, masses, or tenderness noted. Lungs:     Normal respiratory effort, chest expands symmetrically. Lungs are clear to auscultation, no crackles or wheezes. Heart:     Normal rate and regular rhythm. S1 and S2 normal without gallop, murmur, click,  rub or other extra sounds. Abdomen:     mild epigastric tender Extremities:     no edema, no ulcers     Impression & Recommendations:  Problem # 1:  ANEMIA-NOS (ICD-285.9) check f/u hgb - may need outpt transfusion if less than 7.2 as on d/c - no obvious bleeding since d/c  Problem # 2:  DIABETES MELLITUS, TYPE II (ICD-250.00)  Her updated medication list for this problem includes:    Metformin Hcl 500 Mg Tb24 (Metformin hcl) .Marland Kitchen... 1 by mouth bid    Januvia 100 Mg Tabs (Sitagliptin phosphate) .Marland Kitchen... 1 by mouth qd    Ecotrin Low Strength 81 Mg Tbec (Aspirin) .Marland Kitchen... 1po qd    Novolog Flexpen 100 Unit/ml Soln (Insulin aspart) ..... Use asd qid sliding scale gave new novolog flexpen to use SSI once daily and at bedtime at home, sugars much higher it seems at this time due to feeds  Labs Reviewed: HgBA1c: 6.3 (09/01/2007)   Creat: 1.1 (09/01/2007)      Problem # 3:  ABDOMINAL PAIN, GENERALIZED (ICD-789.07) refer back to GI - dr stark, gave rx for phenergan as needed  Orders: Gastroenterology Referral (GI) TLB-BMP (Basic Metabolic Panel-BMET) (99991111) TLB-CBC Platelet - w/Differential (85025-CBCD) TLB-Hepatic/Liver Function Pnl (80076-HEPATIC) TLB-Udip ONLY (81003-UDIP)   Complete Medication List: 1)  Fluoxetine Hcl 40 Mg Caps (Fluoxetine hcl) .Marland Kitchen.. 1 by mouth qd 2)  Premarin 1.25 Mg Tabs (Estrogens conjugated) .Marland Kitchen.. 1 by mouth qd 3)  Metformin Hcl 500 Mg Tb24 (Metformin hcl) .Marland Kitchen.. 1 by mouth bid 4)  Lipitor 80 Mg Tabs (Atorvastatin calcium) .Marland Kitchen.. 1 by mouth qd 5)  Climara 0.05 Mg/24hr Ptwk (Estradiol) .Marland Kitchen.. 1 patch q wk 6)  Vesicare 5 Mg Tabs (Solifenacin succinate) .Marland Kitchen.. 1 by mouth qd 7)  Isosorbide Dinitrate 10 Mg Tabs (Isosorbide dinitrate) .Marland Kitchen.. 1 by mouth bid 8)  Metoprolol Tartrate 100 Mg Tabs (Metoprolol tartrate) .Marland Kitchen.. 1 by mouth bid 9)  Clonidine Hcl 0.1 Mg Tabs (Clonidine hcl) .Marland Kitchen.. 1 by mouth bid 10)  Azor 10-40 Mg Tabs (Amlodipine-olmesartan) .Marland Kitchen.. 1 by mouth qd 11)   Januvia 100 Mg Tabs (Sitagliptin phosphate) .Marland Kitchen.. 1 by mouth qd 12)  Vicodin Hp 10-660 Mg Tabs (Hydrocodone-acetaminophen) .Marland Kitchen.. 1 by mouth qid prn 13)  Ecotrin Low Strength 81 Mg Tbec (Aspirin) .Marland Kitchen.. 1po qd 14)  Fioricet 50-325-40 Mg Tabs (Butalbital-apap-caffeine) .Marland Kitchen.. 1 by mouth  qd 15)  Zolpidem Tartrate 10 Mg Tabs (Zolpidem tartrate) .Marland Kitchen.. 1 by mouth at bedtime prn 16)  Flexeril 5 Mg Tabs (Cyclobenzaprine hcl) .Marland Kitchen.. 1po three times a day prn 17)  Celebrex 200 Mg Caps (Celecoxib) .Marland Kitchen.. 1 by mouth qd 18)  Zoloft 50 Mg Tabs (Sertraline hcl) .Marland Kitchen.. 1 by mouth qd 19)  Novolog Flexpen 100 Unit/ml Soln (Insulin aspart) .... Use asd qid sliding scale 20)  Novolog Flexpen Needle  .... Use asd qid with sliding scale 21)  Promethazine Hcl 25 Mg Tabs (Promethazine hcl) .Marland Kitchen.. 1 by mouth q 6 hrs prn   Patient Instructions: 1)  you will have labs tests today 2)  you may need an outpatient blood transfusion 3)  you will be contacted about the referral to dr stark 4)  you are given the prescription for the nausea medication 5)  please use the novolog for the sliding scale as needed 6)  Please schedule a follow-up appointment in 1 month.    Prescriptions: PROMETHAZINE HCL 25 MG  TABS (PROMETHAZINE HCL) 1 by mouth q 6 hrs prn  #60 x 1   Entered and Authorized by:   Biagio Borg MD   Signed by:   Biagio Borg MD on 12/21/2007   Method used:   Print then Give to Patient   RxID:   [id number] [place] NEEDLE use asd qid with sliding scale  #5 pens x 11   Entered and Authorized by:   Biagio Borg MD   Signed by:   Biagio Borg MD on 12/21/2007   Method used:   Print then Give to Patient   RxID:   [id number] [place] NEEDLE use asd qid with sliding scale  #120 x 11   Entered and Authorized by:   Biagio Borg MD   Signed by:   Biagio Borg MD on 12/21/2007   Method used:   Print then Give to Patient   RxID:   [id number] NOVOLOG FLEXPEN 100 UNIT/ML  SOLN (INSULIN ASPART) use  asd qid sliding scale  #5 pens x 11   Entered and Authorized by:   Biagio Borg MD   Signed by:   Biagio Borg MD on 12/21/2007   Method used:   Electronically sent to ...       [place], [place]  [address]       Ph: [phone]       Fax: [phone]   RxID:   [id number]  ]

## 2010-11-06 NOTE — Assessment & Plan Note (Signed)
Summary: HAVING A PROBLEM WITH HARMON MEDS/NWS  $50   Vital Signs:  Patient Profile:   60 Years Old Female Weight:      261.8 pounds Temp:     97.3 degrees F oral Pulse rate:   101 / minute BP sitting:   126 / 80  (left arm) Cuff size:   regular  Vitals Entered By: Sherlean Foot CMA (June 29, 2008 11:16 AM)                 Chief Complaint:  dicuss meds.  History of Present Illness: having more hot flashes lately - hormone patch not working as well as it did previously, no new complaints o/w except the azor is too expensive although works very well; sees dr gegick/endo for DM- last a1c quite good last month at less than 6.0; sleeping well on the zolpidem for now, denies significant depressive symptoms or suicidal ideation    Updated Prior Medication List: FLUOXETINE HCL 40 MG  CAPS (FLUOXETINE HCL) 1 by mouth qd METFORMIN HCL 500 MG TB24 (METFORMIN HCL) 1 by mouth bid LIPITOR 80 MG  TABS (ATORVASTATIN CALCIUM) 1 by mouth qd VESICARE 5 MG  TABS (SOLIFENACIN SUCCINATE) 1 by mouth qd ISOSORBIDE DINITRATE 10 MG  TABS (ISOSORBIDE DINITRATE) 1 by mouth bid METOPROLOL TARTRATE 100 MG  TABS (METOPROLOL TARTRATE) 1 by mouth bid CLONIDINE HCL 0.1 MG  TABS (CLONIDINE HCL) 1 by mouth bid EXFORGE 10-320 MG TABS (AMLODIPINE BESYLATE-VALSARTAN) 1 by mouth once daily JANUVIA 100 MG  TABS (SITAGLIPTIN PHOSPHATE) 1 by mouth qd VICODIN HP 10-660 MG  TABS (HYDROCODONE-ACETAMINOPHEN) 1 by mouth qid prn ECOTRIN LOW STRENGTH 81 MG  TBEC (ASPIRIN) 1po qd FIORICET 50-325-40 MG  TABS (BUTALBITAL-APAP-CAFFEINE) 1 by mouth qd ZOLPIDEM TARTRATE 10 MG  TABS (ZOLPIDEM TARTRATE) 1 by mouth at bedtime prn FLEXERIL 5 MG  TABS (CYCLOBENZAPRINE HCL) 1po three times a day prn CELEBREX 200 MG  CAPS (CELECOXIB) 1 by mouth qd ZOLOFT 50 MG  TABS (SERTRALINE HCL) 1 by mouth qd NOVOLOG FLEXPEN 100 UNIT/ML  SOLN (INSULIN ASPART) use asd qid sliding scale * NOVOLOG FLEXPEN NEEDLE use asd qid with sliding  scale PROMETHAZINE HCL 25 MG  TABS (PROMETHAZINE HCL) 1 by mouth q 6 hrs prn ESTRADIOL 0.1 MG/24HR PTWK (ESTRADIOL) use asd 1 patch q wk  Current Allergies (reviewed today): ! PCN  Past Medical History:    Reviewed history from 05/06/2008 and no changes required:       Anxiety       Coronary artery disease       Diabetes mellitus, type II - per dr Electa Sniff       Hyperlipidemia       Hypertension       morbid obesity       migraine       OAB       Anemia-NOS       hx of pos PPD       Depression       Hiatal Hernia       Gastric ulcer       Esophagitis       Diverticulitis       Hemorrhoids       Diverticulosis  Past Surgical History:    Reviewed history from 05/06/2008 and no changes required:       s/p left knee replacement       s/p right knee replacement with revision 7/09 - dr graves  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   Head:     Normocephalic and atraumatic without obvious abnormalities. No apparent alopecia or balding. Eyes:     No corneal or conjunctival inflammation noted. EOMI. Perrla. Ears:     External ear exam shows no significant lesions or deformities.  Otoscopic examination reveals clear canals, tympanic membranes are intact bilaterally without bulging, retraction, inflammation or discharge. Hearing is grossly normal bilaterally. Nose:     External nasal examination shows no deformity or inflammation. Nasal mucosa are pink and moist without lesions or exudates. Mouth:     Oral mucosa and oropharynx without lesions or exudates.  Teeth in good repair. Neck:     No deformities, masses, or tenderness noted. Lungs:     Normal respiratory effort,  chest expands symmetrically. Lungs are clear to auscultation, no crackles or wheezes. Heart:     Normal rate and regular rhythm. S1 and S2 normal without gallop, murmur, click, rub or other extra sounds. Msk:     normal ROM, no joint tenderness, and no joint swelling.   Extremities:     no edema, no ulcers  Neurologic:     alert & oriented X3 and cranial nerves II-XII intact.      Impression & Recommendations:  Problem # 1:  MENOPAUSAL DISORDER (ICD-627.9)  The following medications were removed from the medication list:    Premarin 1.25 Mg Tabs (Estrogens conjugated) .Marland Kitchen... 1 by mouth qd    Climara 0.05 Mg/24hr Ptwk (Estradiol) .Marland Kitchen... 1 patch q wk  Her updated medication list for this problem includes:    Estradiol 0.1 Mg/24hr Ptwk (Estradiol) ..... Use asd 1 patch q wk meds adjsuted as above  Problem # 2:  HYPERTENSION (ICD-401.9)  Her updated medication list for this problem includes:    Metoprolol Tartrate 100 Mg Tabs (Metoprolol tartrate) .Marland Kitchen... 1 by mouth bid    Clonidine Hcl 0.1 Mg Tabs (Clonidine hcl) .Marland Kitchen... 1 by mouth bid    Exforge 10-320 Mg Tabs (Amlodipine besylate-valsartan) .Marland Kitchen... 1 by mouth once daily  BP today: 126/80 Prior BP: 183/89 (12/21/2007)  Labs Reviewed: Creat: 1.0 (12/21/2007) Chol: 223 (09/01/2007)   HDL: 49.1 (09/01/2007)   LDL: DEL (09/01/2007)   TG: 146 (09/01/2007) stable overall by hx and exam, ok to continue meds/tx as is   Problem # 3:  HYPERLIPIDEMIA (ICD-272.4)  Her updated medication list for this problem includes:    Lipitor 80 Mg Tabs (Atorvastatin calcium) .Marland Kitchen... 1 by mouth qd  Labs Reviewed: Chol: [id number] (09/01/2007)   HDL: 49.1 (09/01/2007)   LDL: DEL (09/01/2007)   TG: 146 (09/01/2007) SGOT: 18 (12/21/2007)   SGPT: 18 (12/21/2007) stable overall by hx and exam, ok to continue meds/tx as is , to check lipids next visit per pt  Problem # 4:  DEPRESSION (ICD-311)  The following medications were removed from the medication list:     Fluoxetine Hcl 40 Mg Caps (Fluoxetine hcl) .Marland Kitchen... 1 by mouth qd  Her updated medication list for this problem includes:    Zoloft 50 Mg Tabs (Sertraline hcl) .Marland KitchenMarland KitchenMarland KitchenMarland Kitchen  1 by mouth qd stable overall by hx and exam, ok to continue meds/tx as is   Complete Medication List: 1)  Metformin Hcl 500 Mg Tb24 (Metformin hcl) .Marland Kitchen.. 1 by mouth bid 2)  Lipitor 80 Mg Tabs (Atorvastatin calcium) .Marland Kitchen.. 1 by mouth qd 3)  Vesicare 5 Mg Tabs (Solifenacin succinate) .Marland Kitchen.. 1 by mouth qd 4)  Isosorbide Dinitrate 10 Mg Tabs (Isosorbide dinitrate) .Marland Kitchen.. 1 by mouth bid 5)  Metoprolol Tartrate 100 Mg Tabs (Metoprolol tartrate) .Marland Kitchen.. 1 by mouth bid 6)  Clonidine Hcl 0.1 Mg Tabs (Clonidine hcl) .Marland Kitchen.. 1 by mouth bid 7)  Exforge 10-320 Mg Tabs (Amlodipine besylate-valsartan) .Marland Kitchen.. 1 by mouth once daily 8)  Januvia 100 Mg Tabs (Sitagliptin phosphate) .Marland Kitchen.. 1 by mouth qd 9)  Vicodin Hp 10-660 Mg Tabs (Hydrocodone-acetaminophen) .Marland Kitchen.. 1 by mouth qid prn 10)  Ecotrin Low Strength 81 Mg Tbec (Aspirin) .Marland Kitchen.. 1po qd 11)  Fioricet 50-325-40 Mg Tabs (Butalbital-apap-caffeine) .Marland Kitchen.. 1 by mouth qd 12)  Zolpidem Tartrate 10 Mg Tabs (Zolpidem tartrate) .Marland Kitchen.. 1 by mouth at bedtime prn 13)  Flexeril 5 Mg Tabs (Cyclobenzaprine hcl) .Marland Kitchen.. 1po three times a day prn 14)  Celebrex 200 Mg Caps (Celecoxib) .Marland Kitchen.. 1 by mouth qd 15)  Zoloft 50 Mg Tabs (Sertraline hcl) .Marland Kitchen.. 1 by mouth qd 16)  Novolog Flexpen 100 Unit/ml Soln (Insulin aspart) .... Use asd qid sliding scale 17)  Novolog Flexpen Needle  .... Use asd qid with sliding scale 18)  Promethazine Hcl 25 Mg Tabs (Promethazine hcl) .Marland Kitchen.. 1 by mouth q 6 hrs prn 19)  Estradiol 0.1 Mg/24hr Ptwk (Estradiol) .... Use asd 1 patch q wk   Patient Instructions: 1)  stop the azor 2)  start the samples on the exforge 10/320 - 1 per day - instead of the azor 3)  please check with the local pharmacy and the mail-in pharmacy to see if this is better paid for 4)  Continue all medications that you may have been taking  previously, except the hormone patch was increased today (and sent to your pharmacy on the computer) 5)  Please schedule a follow-up appointment in 6 months.   Prescriptions: EXFORGE 10-320 MG TABS (AMLODIPINE BESYLATE-VALSARTAN) 1 by mouth once daily  #90 x 3   Entered and Authorized by:   Biagio Borg MD   Signed by:   Biagio Borg MD on 06/29/2008   Method used:   Print then Give to Patient   RxID:   [id number] EXFORGE 10-320 MG TABS (AMLODIPINE BESYLATE-VALSARTAN) 1 by mouth once daily  #30 x 11   Entered and Authorized by:   Biagio Borg MD   Signed by:   Biagio Borg MD on 06/29/2008   Method used:   [facility] [address] [phone]* (retail)       [address]       [place], [place]  [address]       Ph: [phone] or [phone]       Fax: [phone]   RxID:   [id number] ESTRADIOL 0.1 MG/24HR PTWK (ESTRADIOL) use asd 1 patch q wk  #4 x 11   Entered and Authorized by:   Biagio Borg MD   Signed by:   Biagio Borg MD on 06/29/2008   Method used:   [facility] to        [facility] [address] [phone]* (retail)  [address]       [place], [place]  [address]       Ph: [phone] or [phone]       Fax: [phone]   RxID:   [id number]  ]

## 2010-11-06 NOTE — Progress Notes (Signed)
Summary: Abd pain  Phone Note Call from Patient Call back at Home Phone [phone]   Caller: Patient Summary of Call: pt called complaining of abd pain and tarry stools. please advise Initial call taken by: Crissie Sickles, CMA,  September 11, 2009 2:51 PM  Follow-up for Phone Call        go to ER now, as the tarry stools can mean GI bleeding Follow-up by: Biagio Borg MD,  September 11, 2009 2:53 PM  Additional Follow-up for Phone Call Additional follow up Details #1::        pt advised. will go to the ER Additional Follow-up by: Crissie Sickles, CMA,  September 11, 2009 3:34 PM

## 2010-11-06 NOTE — Procedures (Signed)
Summary: EGD   EGD  Procedure date:  03/16/2009  Findings:      Location: [place]    ENDOSCOPY PROCEDURE REPORT  PATIENT:  [name], [name]  MR#:  [id number] BIRTHDATE:   November 30, 1950, 89 yrs. old   GENDER:   female  ENDOSCOPIST:   Gatha Mayer, MD, [facility]    PROCEDURE DATE:  03/16/2009 PROCEDURE:  EGD, diagnostic ASA CLASS:   Class III INDICATIONS: epigastric pain, nausea and vomiting, heartburn   MEDICATIONS:    Fentanyl 50 mcg IV, Versed 7 mg IV TOPICAL ANESTHETIC:   Exactacain Spray  DESCRIPTION OF PROCEDURE:   After the risks benefits and alternatives of the procedure were thoroughly explained, informed consent was obtained.  The  endoscope was introduced through the mouth and advanced to the second portion of the duodenum, limited by Retained food in the stomach.   The instrument was slowly withdrawn as the mucosa was fully examined. <<PROCEDUREIMAGES>>            <<OLD IMAGES>>  Retained food was present in the body of the stomach.  Post-operative change was noted in the antrum. There was a blind pouch in anterior antral area, with a retained staple.  An erosion was found at the gastroesophageal junction.  Otherwise the examination was normal.    Retroflexed views revealed Retroflexion exam demonstrated findings as previously described.    The scope was then withdrawn from the patient and the procedure completed.  COMPLICATIONS:   None  ENDOSCOPIC IMPRESSION:  1) Food, retained in the body of the stomach  2) Post-operative change in the antrum  3) Erosion at the gastroesophageal junction  4) Otherwise normal examination   Gastroparesis and/or post-operative gastric dysfunction seem likely.  RECOMMENDATIONS:  1) Go to office lab for anemia panel today (has a chronic anemia).  2) Stop Carafate  3) See Dr. Fuller Plan in 4-6 weeks  4) Do not take metaclopramide (Reglan) longer than 2 months without ok from doctor  5) Keep blood sugars  controlled  6) Stay on a step 1 or step 2 gastroparesis diet at this time (literature provided)  REPEAT EXAM:   Not now    Gatha Mayer, MD, [facility]    CC: Lucio Edward, MD The Patient

## 2010-11-06 NOTE — Consult Note (Signed)
Summary: Consultation Report/rt knee pa/[facility]  Consultation Report/rt knee pa/[facility]   Imported By: Bubba Hales 05/05/2008 10:19:18  _____________________________________________________________________  External Attachment:    Type:   Image     Comment:   External Document

## 2010-11-06 NOTE — Progress Notes (Signed)
Summary: TRIAGE OBSTRUCTION/PAIN   Phone Note From Other Clinic Call back at [phone]   Caller: [place] Call For: [name] Summary of Call: PT HAS A GASTRIC OUTLET OBSTRUCTION AND SEVERE ABD PAIN  .Marland KitchenMarland KitchenDR Para Skeans LIKE HER SEEN SOONER THAN 1ST AVAIL 8-3 Initial call taken by: Ronalee Red,  April 13, 2008 2:22 PM  Follow-up for Phone Call        Returned call to Susie she says Dr Hassell Done and Dr Fuller Plan have spoke will await instructions from Dr Fuller Plan. Follow-up by: Barb Merino RN,  April 13, 2008 2:52 PM  Additional Follow-up for Phone Call Additional follow up Details #1::        per Dr Fuller Plan pt needs direct endo for dark tarry stools, and abdominal pain.  Spoke with [name] she is scheduled for endo at [facility] 04-15-08 8:30 , pt instructed to 1/2 insulin dose on 04-14-08 in the pm and no oral diabetes meds or insulin the morning of the procedure, NPO after midnight, and to bring a driver to drive her home and remain while at the hospital.. Additional Follow-up by: Barb Merino RN,  April 13, 2008 3:13 PM

## 2010-11-06 NOTE — Letter (Signed)
Summary: [facility]   Imported By: Bubba Hales 01/12/2009 08:48:33  _____________________________________________________________________  External Attachment:    Type:   Image     Comment:   External Document

## 2010-11-06 NOTE — Progress Notes (Signed)
Summary: Med. change  Phone Note From Pharmacy   Caller: [facility] Summary of Call: Pharmacy requesting to change medication as patient is on Simvastatin 80mg  and Exforge 10/320 please advise. Initial call taken by: Robin Ewing CMA Deborra Medina),  May 04, 2010 9:45 AM  Follow-up for Phone Call        ok to change to simvastatin 20 mg   - done per emr Follow-up by: Biagio Borg MD,  May 04, 2010 12:54 PM    New/Updated Medications: SIMVASTATIN 20 MG TABS (SIMVASTATIN) 1po once daily Prescriptions: SIMVASTATIN 20 MG TABS (SIMVASTATIN) 1po once daily  #90 x 3   Entered by:   Sharon Seller CMA ([place])   Authorized by:   Biagio Borg MD   Signed by:   Sharon Seller CMA ([place]) on 05/04/2010   Method used:   Faxed to ...       [facility] (mail-order)             , [place]         Ph: [phone]       Fax: [phone]   [id number] SIMVASTATIN 20 MG TABS (SIMVASTATIN) 1po once daily  #90 x 3   Entered and Authorized by:   Biagio Borg MD   Signed by:   Biagio Borg MD on 05/04/2010   Method used:   [facility]        [facility] [address] [phone]* (retail)       [place] [address]       [place], [place]  [address]       Ph: [phone] or [phone]       Fax: [phone]   RxID:   [id number]

## 2010-11-06 NOTE — Assessment & Plan Note (Signed)
Summary: CPX/ BCBS AND MEDICARE/[place] $50   Vital Signs:  Patient profile:   60 year old female Height:      66 inches Weight:      263.50 pounds BMI:     42.68 O2 Sat:      98 % Temp:     97.0 degrees F oral Pulse rate:   75 / minute BP sitting:   132 / 64  (left arm) Cuff size:   large  Vitals Entered By: Hector Brunswick (Feb 13, 2009 10:47 AM) CC: cpx   CC:  cpx.  History of Present Illness: not taking the clonidine or metoprolol ? why; undergoing PT for the right shoulder for planned 3 mo PT, denies CP, sob, doe, wheezing, orthopnea, pnd or LE edema  Problems Prior to Update: 1)  Preventive Health Care  (ICD-V70.0) 2)  Shoulder Pain, Left  (ICD-719.41) 3)  Cervical Radiculopathy, Left  (ICD-723.4) 4)  Menopausal Disorder  (ICD-627.9) 5)  Abdominal Pain, Generalized  (ICD-789.07) 6)  Laceration  (ICD-879.8) 7)  Contusions, Multiple  (ICD-924.8) 8)  Joint Effusion, Left Knee  (ICD-719.06) 9)  Shoulder Pain, Right  (ICD-719.41) 10)  Insomnia-sleep Disorder-unspec  (ICD-307.40) 11)  Depression  (ICD-311) 12)  Abdominal Pain, Left Lower Quadrant  (ICD-789.04) 13)  Family History Diabetes 1st Degree Relative  (ICD-V18.0) 14)  Family History of Cad Female 1st Degree Relative <50  (ICD-V17.3) 15)  Positive Ppd  (ICD-795.5) 16)  Anemia-nos  (ICD-285.9) 17)  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 1 By Mouth Bid 2)  Lipitor 80 Mg  Tabs (Atorvastatin Calcium) .Marland Kitchen.. 1 By Mouth Qd 3)  Vesicare 5 Mg  Tabs (Solifenacin Succinate) .Marland Kitchen.. 1 By Mouth Qd 4)  Isosorbide Dinitrate 10 Mg  Tabs (Isosorbide Dinitrate) .Marland Kitchen.. 1 By Mouth Bid 5)  Metoprolol  Tartrate 100 Mg  Tabs (Metoprolol Tartrate) .Marland Kitchen.. 1 By Mouth Bid 6)  Clonidine Hcl 0.1 Mg  Tabs (Clonidine Hcl) .Marland Kitchen.. 1 By Mouth Bid 7)  Exforge 10-320 Mg Tabs (Amlodipine Besylate-Valsartan) .Marland Kitchen.. 1 By Mouth Once Daily 8)  Januvia 100 Mg  Tabs (Sitagliptin Phosphate) .Marland Kitchen.. 1 By Mouth Qd 9)  Ecotrin Low Strength 81 Mg  Tbec (Aspirin) .Marland Kitchen.. 1po Qd 10)  Fioricet 50-325-40 Mg  Tabs (Butalbital-Apap-Caffeine) .Marland Kitchen.. 1 By Mouth Once Daily As Needed 11)  Zolpidem Tartrate 10 Mg  Tabs (Zolpidem Tartrate) .Marland Kitchen.. 1 By Mouth At Bedtime Prn 12)  Celebrex 200 Mg  Caps (Celecoxib) .Marland Kitchen.. 1 By Mouth Qd 13)  Zoloft 50 Mg  Tabs (Sertraline Hcl) .Marland Kitchen.. 1 By Mouth Qd 14)  Novolog Flexpen 100 Unit/ml  Soln (Insulin Aspart) .... Use Asd Qid Sliding Scale 15)  Novolog Flexpen Needle .... Use Asd Qid With Sliding Scale 16)  Estradiol 0.1 Mg/24hr Ptwk (Estradiol) .... Use Asd 1 Patch Q Wk 17)  Hydrocodone-Acetaminophen 5-325 Mg Tabs (Hydrocodone-Acetaminophen) .Marland Kitchen.. 1po Q 6 Hrs As Needed Pain 60  Current Medications (verified): 1)  Metformin Hcl 500 Mg Tb24 (Metformin Hcl) .Marland Kitchen.. 1 By Mouth Two Times A Day 2)  Lipitor 80 Mg  Tabs (Atorvastatin Calcium) .Marland Kitchen.. 1 By Mouth Once Daily 3)  Vesicare 5 Mg  Tabs (Solifenacin  Succinate) .Marland Kitchen.. 1 By Mouth Qd 4)  Isosorbide Dinitrate 10 Mg  Tabs (Isosorbide Dinitrate) .Marland Kitchen.. 1 By Mouth Two Times A Day 5)  Metoprolol Tartrate 100 Mg  Tabs (Metoprolol Tartrate) .... 1/2  By Mouth Two Times A Day 6)  Exforge 10-320 Mg Tabs (Amlodipine Besylate-Valsartan) .Marland Kitchen.. 1 By Mouth Once Daily 7)  Januvia 100 Mg  Tabs (Sitagliptin Phosphate) .Marland Kitchen.. 1 By Mouth Once Daily 8)  Ecotrin Low Strength 81 Mg  Tbec (Aspirin) .Marland Kitchen.. 1po Qd 9)  Fioricet 50-325-40 Mg  Tabs (Butalbital-Apap-Caffeine) .Marland Kitchen.. 1 By Mouth Once Daily As Needed 10)  Zolpidem Tartrate 10 Mg  Tabs (Zolpidem Tartrate) .Marland Kitchen.. 1 By Mouth At Bedtime As Needed 11)  Zoloft 50 Mg  Tabs (Sertraline Hcl) .Marland Kitchen.. 1 By Mouth Once Daily - Generic Ok 12)  Estradiol 1 Mg  Tabs (Estradiol) .Marland Kitchen.. 1 By Mouth Once Daily 13)  Hydrocodone-Acetaminophen 5-325 Mg Tabs (Hydrocodone-Acetaminophen) .Marland Kitchen.. 1po Q 6 Hrs As Needed Pain 60  Allergies (verified): 1)  ! Pcn  Past History:  Family History:    Family History of CAD Female 1st degree relative    Family History Diabetes 1st degree relative    Family History Hypertension     (08/13/2007)  Social History:    Former Smoker    Alcohol use-no    Married     (12/21/2007)  Risk Factors:    Alcohol Use: N/A    >5 drinks/d w/in last 3 months: N/A    Caffeine Use: N/A    Diet: N/A    Exercise: N/A  Risk Factors:    Smoking Status: quit (08/13/2007)    Packs/Day: N/A    Cigars/wk: N/A    Pipe Use/wk: N/A    Cans of tobacco/wk: N/A    Passive Smoke Exposure: N/A  Past Medical History:    Anxiety    Coronary artery disease    Diabetes mellitus, type II - per dr Electa Sniff, currently off novolog    Hyperlipidemia    Hypertension    morbid obesity    migraine    OAB    Anemia-NOS    hx of pos PPD    Depression    Hiatal Hernia    Gastric ulcer 04/2008    Esophagitis    Diverticulitis    Hemorrhoids    Diverticulosis  Past Surgical History:    s/p left knee replacement    s/p right knee replacement with revision 7/09 - dr graves    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 with laparotomy and gastrogastrostomy    s/p irght rotater cuff repair - 01/2009  Family History:    Reviewed history from 08/13/2007 and no changes required:       Family History of CAD Female 1st degree relative       Family History Diabetes 1st degree relative       Family History Hypertension  Social History:    Reviewed history from 12/21/2007 and no changes required:       Former Smoker       Alcohol use-no       Married  Review of Systems  The patient denies anorexia, fever, weight loss, weight gain, vision loss, decreased hearing, hoarseness, chest pain, syncope, dyspnea on exertion, peripheral edema, prolonged  cough, headaches, hemoptysis, abdominal pain, melena, hematochezia, severe indigestion/heartburn, hematuria, incontinence, muscle weakness, suspicious skin lesions, transient blindness, difficulty walking, depression, unusual weight change, abnormal bleeding, enlarged lymph nodes, angioedema, and breast masses.  all otherwise negative except for increased depressive symptoms without suicidal ideation  Physical Exam  General:  alert and overweight-appearing.   Head:  normocephalic and atraumatic.   Eyes:  vision grossly intact, pupils equal, and pupils round.   Ears:  R ear normal and L ear normal.   Nose:  no external deformity and no nasal discharge.   Mouth:  good dentition and pharynx pink and moist.   Neck:  supple and no masses.   Lungs:  normal respiratory effort and normal breath sounds.   Heart:  normal rate and regular rhythm.   Abdomen:  soft, non-tender, and normal bowel sounds.   Msk:  no joint tenderness and no joint swelling.   Extremities:  no edema, no ulcers  Neurologic:  cranial nerves II-XII intact and strength normal in all extremities.     Impression & Recommendations:  Problem # 1:  Preventive Health Care (ICD-V70.0)  Overall doing well, up to date, counseled on routine health concerns for screening and prevention, immunizations up to date or declined, labs ordered  Problem # 2:  HYPERTENSION (ICD-401.9)  The following medications were removed from the medication list:    Clonidine Hcl 0.1 Mg Tabs (Clonidine hcl) .Marland Kitchen... 1 by mouth bid Her updated medication list for this problem includes:    Metoprolol Tartrate 100 Mg Tabs (Metoprolol tartrate) .Marland Kitchen... 1/2  by mouth two times a day    Exforge 10-320 Mg Tabs (Amlodipine besylate-valsartan) .Marland Kitchen... 1 by mouth once daily meds adjusted today  Problem # 3:  DEPRESSION (ICD-311)  Her updated medication list for this problem includes:    Zoloft 50 Mg Tabs (Sertraline hcl) .Marland Kitchen... 1 by mouth once daily - generic  ok to re-start today, denies suicidal ideation  Complete Medication List: 1)  Metformin Hcl 500 Mg Tb24 (Metformin hcl) .Marland Kitchen.. 1 by mouth two times a day 2)  Lipitor 80 Mg Tabs (Atorvastatin calcium) .Marland Kitchen.. 1 by mouth once daily 3)  Vesicare 5 Mg Tabs (Solifenacin succinate) .Marland Kitchen.. 1 by mouth qd 4)  Isosorbide Dinitrate 10 Mg Tabs (Isosorbide dinitrate) .Marland Kitchen.. 1 by mouth two times a day 5)  Metoprolol Tartrate 100 Mg Tabs (Metoprolol tartrate) .... 1/2  by mouth two times a day 6)  Exforge 10-320 Mg Tabs (Amlodipine besylate-valsartan) .Marland Kitchen.. 1 by mouth once daily 7)  Januvia 100 Mg Tabs (Sitagliptin phosphate) .Marland Kitchen.. 1 by mouth once daily 8)  Ecotrin Low Strength 81 Mg Tbec (Aspirin) .Marland Kitchen.. 1po qd 9)  Fioricet 50-325-40 Mg Tabs (Butalbital-apap-caffeine) .Marland Kitchen.. 1 by mouth once daily as needed 10)  Zolpidem Tartrate 10 Mg Tabs (Zolpidem tartrate) .Marland Kitchen.. 1 by mouth at bedtime as needed 11)  Zoloft 50 Mg Tabs (Sertraline hcl) .Marland Kitchen.. 1 by mouth once daily - generic ok 12)  Estradiol 1 Mg Tabs (Estradiol) .Marland Kitchen.. 1 by mouth once daily 13)  Hydrocodone-acetaminophen 5-325 Mg Tabs (Hydrocodone-acetaminophen) .Marland Kitchen.. 1po q 6 hrs as needed pain 60  Patient Instructions: 1)  re-start the metoprolol at 50 mg twice per day, and the zoloft 50 mg per day 2)  we will leave off the clonidine for now  3)  you are given the lipitor samples today 4)  please re-start the zoloft generic 5)  Please go to the Lab in the basement for your blood tests today 6)  the form was filled out today 7)  Continue all medications that you may have been taking previously  8)  Please schedule a follow-up appointment in 1 year or sooner if needed Prescriptions:  ESTRADIOL 1 MG TABS (ESTRADIOL) 1 by mouth once daily  #90 x 3   Entered and Authorized by:   Biagio Borg MD   Signed by:   Biagio Borg MD on 02/13/2009   Method used:   Electronically to        [facility] [phone]* (retail)       [address]       [place], [place]   [address]       Ph: [phone]       Fax: [phone]   [id number] ESTRADIOL 1 MG TABS (ESTRADIOL) 1 by mouth once daily  #90 x 3   Entered and Authorized by:   Biagio Borg MD   Signed by:   Biagio Borg MD on 02/13/2009   Method used:   Print then Give to Patient   [id number] ESTRADIOL 0.1 MG/24HR PTWK (ESTRADIOL) use asd 1 patch q wk  #12 x 3   Entered and Authorized by:   Biagio Borg MD   Signed by:   Biagio Borg MD on 02/13/2009   Method used:   Print then Give to Patient   [id number] ZOLOFT 50 MG  TABS (SERTRALINE HCL) 1 by mouth once daily - generic ok  #90 x 3   Entered and Authorized by:   Biagio Borg MD   Signed by:   Biagio Borg MD on 02/13/2009   Method used:   Electronically to        [facility] [phone]* (retail)       [place], [place]  [address]       Ph: [phone]       Fax: [phone]   [id number] ZOLPIDEM TARTRATE 10 MG  TABS (ZOLPIDEM TARTRATE) 1 by mouth at bedtime as needed  #30 x 5   Entered and Authorized by:   Biagio Borg MD   Signed by:   Biagio Borg MD on 02/13/2009   Method used:   Print then Give to Patient   [id number] JANUVIA 100 MG  TABS (SITAGLIPTIN PHOSPHATE) 1 by mouth once daily  #90 x 3   Entered and Authorized by:   Biagio Borg MD   Signed by:   Biagio Borg MD on 02/13/2009   Method used:   Print then Give to Patient   RxID:   [id number] EXFORGE 10-320 MG TABS (AMLODIPINE BESYLATE-VALSARTAN) 1 by mouth once daily  #90 x 3   Entered and Authorized by:   Biagio Borg MD   Signed by:   Biagio Borg MD on 02/13/2009   Method used:   Print then Give to Patient   RxID:   [id number] ISOSORBIDE DINITRATE 10 MG  TABS (ISOSORBIDE DINITRATE) 1 by mouth two times a day  #60 x 11   Entered and Authorized by:   Biagio Borg MD   Signed by:   Biagio Borg MD on 02/13/2009   Method used:   Electronically to        [facility]  [phone]* (retail)       [address]       [place], [place]  [address]       Ph: [phone]       Fax: [phone]   RxID:   [id number] LIPITOR 80 MG  TABS (ATORVASTATIN CALCIUM) 1 by mouth once daily  #90 x 3   Entered and Authorized by:   Biagio Borg MD   Signed by:   Biagio Borg MD on 02/13/2009   Method used:   Print then Give to Patient   RxID:   [id number] LIPITOR 80 MG  TABS (ATORVASTATIN CALCIUM) 1 by mouth once daily  #30 x 11   Entered and Authorized by:   Biagio Borg MD   Signed by:   Biagio Borg MD on 02/13/2009   Method used:   Electronically to        [facility] [phone]* (retail)       [place], [place]  [address]       Ph: [phone]       Fax: [phone]   RxID:   [id number] METFORMIN HCL 500 MG TB24 (METFORMIN HCL) 1 by mouth two times a day  #180 x 3   Entered and Authorized by:   Biagio Borg MD   Signed by:   Biagio Borg MD on 02/13/2009   Method used:   Electronically to        [facility] [phone]* (retail)       [place], [place]  [address]       Ph: [phone]       Fax: [phone]   [id number] METOPROLOL TARTRATE 100 MG  TABS (METOPROLOL TARTRATE) 1/2  by mouth two times a day  #30 x 11   Entered and Authorized by:   Biagio Borg MD   Signed by:   Biagio Borg MD on 02/13/2009   Method used:   Electronically to        [facility] [phone]* (retail)       [address]       [place],   [address]       Ph: [phone]       Fax: [phone]   RxID:   [id number]

## 2010-11-06 NOTE — Letter (Signed)
Summary: [facility]   Imported By: Phillis Knack 11/06/2009 09:42:45  _____________________________________________________________________  External Attachment:    Type:   Image     Comment:   External Document

## 2010-11-06 NOTE — Letter (Signed)
Summary: [facility]  [facility]   Imported By: Phillis Knack 02/21/2010 14:51:02  _____________________________________________________________________  External Attachment:    Type:   Image     Comment:   External Document

## 2010-11-06 NOTE — Letter (Signed)
Summary: [facility]  [facility]   Imported By: Rise Patience 06/14/2010 10:48:19  _____________________________________________________________________  External Attachment:    Type:   Image     Comment:   External Document

## 2010-11-06 NOTE — Progress Notes (Signed)
Summary: ultrasound   Phone Note Call from Patient Call back at Home Phone [phone]   Caller: Patient Call For: Dr. Fuller Plan Reason for Call: Lab or Test Results Summary of Call: pt would like results from ultrasound Initial call taken by: Lucien Mons,  May 11, 2009 2:27 PM  Follow-up for Phone Call        Patient notified that Dr Fuller Plan will have to review [place] report but there are no acute findings. Follow-up by: Barb Merino RN, [place],  May 11, 2009 2:41 PM

## 2010-11-06 NOTE — Assessment & Plan Note (Signed)
Summary: follow up EGD/sheri   History of Present Illness Visit Type: Follow-up Visit Primary GI MD: Joylene Igo MD [facility] Primary Provider: Cathlean Cower, MD Chief Complaint: Patient here for f/u after EGD. Patient states that she is not much better. She states that she still has a "sour stomach" and her food will not digest. She also c/o Left sided abdominal pain. History of Present Illness:   [name] returns for followup today she underwent upper endoscopy by Dr. Carlean Purl showing retained food in the stomach and an esophageal erosion. She was also noted to have a normocytic anemia with a hemoglobin of 10.7. Iron was started. She has had some improvement in her upper abdominal pain, belching, heartburn and nausea since taking Reglan and Carafate. Her symptoms are not under control.   GI Review of Systems    Reports abdominal pain, belching, heartburn, and  nausea.     Location of  Abdominal pain: left side.    Denies acid reflux, bloating, chest pain, dysphagia with liquids, dysphagia with solids, loss of appetite, vomiting, vomiting blood, weight loss, and  weight gain.      Reports hemorrhoids.     Denies anal fissure, black tarry stools, change in bowel habit, constipation, diarrhea, diverticulosis, fecal incontinence, heme positive stool, irritable bowel syndrome, jaundice, light color stool, liver problems, rectal bleeding, and  rectal pain.   Current Medications (verified): 1)  Metformin Hcl 500 Mg Tb24 (Metformin Hcl) .Marland Kitchen.. 1 By Mouth Two Times A Day 2)  Lipitor 80 Mg  Tabs (Atorvastatin Calcium) .Marland Kitchen.. 1 By Mouth Once Daily 3)  Vesicare 5 Mg  Tabs (Solifenacin Succinate) .Marland Kitchen.. 1 By Mouth Qd 4)  Isosorbide Dinitrate 10 Mg  Tabs (Isosorbide Dinitrate) .Marland Kitchen.. 1 By Mouth Two Times A Day 5)  Metoprolol Tartrate 100 Mg  Tabs (Metoprolol Tartrate) .... 1/2  By Mouth Two Times A Day 6)  Exforge 10-320 Mg Tabs (Amlodipine Besylate-Valsartan) .Marland Kitchen.. 1 By Mouth Once Daily 7)  Januvia 100 Mg   Tabs (Sitagliptin Phosphate) .Marland Kitchen.. 1 By Mouth Once Daily 8)  Ecotrin Low Strength 81 Mg  Tbec (Aspirin) .Marland Kitchen.. 1po Qd 9)  Fioricet 50-325-40 Mg  Tabs (Butalbital-Apap-Caffeine) .Marland Kitchen.. 1 By Mouth Once Daily As Needed 10)  Zolpidem Tartrate 10 Mg  Tabs (Zolpidem Tartrate) .Marland Kitchen.. 1 By Mouth At Bedtime As Needed 11)  Zoloft 50 Mg  Tabs (Sertraline Hcl) .Marland Kitchen.. 1 By Mouth Once Daily - Generic Ok 12)  Estradiol 1 Mg Tabs (Estradiol) .Marland Kitchen.. 1 By Mouth Once Daily 13)  Reglan 10 Mg Tabs (Metoclopramide Hcl) .... Take 1 Tab 30 Min Prior To Meals 14)  Promethazine Hcl 25 Mg Tabs (Promethazine Hcl) .... Take 1 Tab Ebery 6 Hours As Needed For Nausea 15)  Darvocet-N 100 100-650 Mg Tabs (Propoxyphene N-Apap) .... Take 1 Tab Every 4 Hours As Needed For Pain 16)  Carafate 1 Gm/56ml Susp (Sucralfate) .... Take 1 Gram Between Meals 17)  Iron 325 (65 Fe) Mg Tabs (Ferrous Sulfate) .Marland Kitchen.. 1 By Mouth Qd  Allergies (verified): 1)  ! Pcn 2)  ! 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 Family History of CAD Female 1st degree relative Family History Diabetes 1st degree relative: Brother x3, Sister x 3 Family History Hypertension No FH of Colon Cancer: Family History of Heart Disease: Brother x 2, Sister x 2  Social History: Reviewed history from 03/15/2009 and no changes required. Former Smoker-stopped 30  years ago Alcohol use-no Married Occupation: Disabled Illicit Drug Use - no Patient gets regular exercise.  Review of Systems       The pertinent positives and negatives are noted as above and in the HPI. All other ROS were reviewed and were negative.   Vital Signs:  Patient profile:   60 year old female Height:      66 inches Weight:      271.25 pounds BMI:     43.94 BSA:     2.28 Pulse rate:   76 / minute Pulse rhythm:   regular BP sitting:   126 / 76  (left arm) Cuff size:   large  Vitals Entered By: Awilda Bill CMA [name]) (April 26, 2009 10:26 AM)  Physical Exam  General:  Well developed, well nourished, no acute distress. obese.   Head:  Normocephalic and atraumatic. Eyes:  PERRLA, no icterus. Mouth:  No deformity or lesions, dentition normal. Lungs:  Clear throughout to auscultation. Heart:  Regular rate and rhythm; no murmurs, rubs,  or bruits. Abdomen:  Soft, nontender and nondistended. No masses, hepatosplenomegaly or hernias noted. Normal bowel sounds. Psych:  Alert and cooperative. Normal mood and affect.   Impression & Recommendations:  Problem # 1:  GERD (ICD-530.81) Begin omeprazole 40 mg daily, all standard antireflux measures and with four-inch bed blocks. She might be having side effects of iron therapy as well. Considered discontinuing Carafate if omeprazole improves her symptoms.  Problem # 2:  GASTROPARESIS (ICD-536.3) Delayed gastric emptying noted on upper endoscopy. Begin standard dietary and lifestyle measures for gastroparesis. Continue Reglan for now. The long-term risks and benefits to Reglan were discussed with the patient including the risk of tardive dyskinesia and our goal is to discontinue this medication within a few months or use it on an infrequent p.r.n. basis.  Problem # 3:  ANEMIA-NOS (ICD-285.9) Further laboratory evaluation of anemia. Orders: TLB-IBC Pnl (Iron/FE;Transferrin) (83550-IBC) TLB-Iron, (Fe) Total  ([id number]) TLB-Folic Acid (Folate) (XX123456) TLB-Ferritin (82728-FER) TLB-B12, Serum-Total ONLY [id number]) TLB-TSH (Thyroid Stimulating Hormone) [id number])  Patient Instructions: 1)  Get your labs drawn today in the basement.  2)  Pick up your prescription at your pharmacy.  3)  Avoid foods high in acid content ( tomatoes, citrus juices, spicy foods) . Avoid eating within 3 to 4 hours of lying down or before exercising. Do not over eat; try smaller more frequent meals. Elevate head of bed four inches when sleeping.  4)  Liquids and foods should be eaten in small, frequent meals. Refer to brochure for further instruction.  5)  Copy sent to : Cathlean Cower, MD 6)  The medication list was reviewed and reconciled.  All changed / newly prescribed medications were explained.  A complete medication list was provided to the patient / caregiver. 7)  Please schedule a follow-up appointment in 2 months.   Prescriptions: PRILOSEC 40 MG CPDR (OMEPRAZOLE) one tablet by mouth once daily  #30 x 11   Entered by:   Marlon Pel CMA ([place])   Authorized by:   Pricilla Riffle  Fuller Plan MD [facility]   Signed by:   Ladene Artist MD [facility] on 04/26/2009   Method used:   Electronically to        [facility] [phone]* (retail)       [address]       [place], [place]  [address]       Ph: [phone] or [phone]       Fax: [phone]   RxID:   [id number]

## 2010-11-06 NOTE — Consult Note (Signed)
Summary: [facility]   Imported By: Edmonia  01/11/2008 13:29:50  _____________________________________________________________________  External Attachment:    Type:   Image     Comment:   External Document

## 2010-11-06 NOTE — Letter (Signed)
[facility]   Imported By: Phillis Knack 03/30/2010 11:47:35  _____________________________________________________________________  External Attachment:    Type:   Image     Comment:   External Document

## 2010-11-06 NOTE — Consult Note (Signed)
Summary: Imported By: Jerrye Noble D'jimraou 10/20/2007 11:17:42   Imported By: Jerrye Noble D'jimraou 10/20/2007 11:17:42  _____________________________________________________________________  External Attachment:    Type:   Image     Comment:   External Document

## 2010-11-06 NOTE — Assessment & Plan Note (Signed)
Summary: FU-STC   Vital Signs:  Patient Profile:   60 Years Old Female Weight:      277 pounds Temp:     98 degrees F oral Pulse rate:   99 / minute BP sitting:   156 / 86  (left arm) Cuff size:   large  Pt. in pain?   no  Vitals Entered By: Elveria Royals (September 01, 2007 3:43 PM)                  Chief Complaint:  f/u.  History of Present Illness: c/o  left abd pain as well as black dark stool - no fever, chills.  Has ongoing headaches to bilat occipital areas- pounding, severe  when it comes on  - no blurred vision, + photophobia and sleep makes it better, but overall having sleep problems too.  Current Allergies (reviewed today): ! PCN Updated/Current Medications (including changes made in today's visit):  FLUOXETINE HCL 40 MG  CAPS (FLUOXETINE HCL) 1 by mouth qd FUROSEMIDE 80 MG  TABS (FUROSEMIDE) 1 by mouth qd PREMARIN 1.25 MG TABS (ESTROGENS CONJUGATED) 1 by mouth qd METFORMIN HCL 500 MG TB24 (METFORMIN HCL) 1 by mouth bid LIPITOR 80 MG  TABS (ATORVASTATIN CALCIUM) 1 by mouth qd CLIMARA 0.05 MG/24HR  PTWK (ESTRADIOL) 1 patch q wk VESICARE 5 MG  TABS (SOLIFENACIN SUCCINATE) 1 by mouth qd IMDUR 30 MG  TB24 (ISOSORBIDE MONONITRATE) 1 by mouth qd METOPROLOL TARTRATE 100 MG  TABS (METOPROLOL TARTRATE) 1 by mouth bid CLONIDINE HCL 0.1 MG  TABS (CLONIDINE HCL) 1 by mouth bid AZOR 10-40 MG  TABS (AMLODIPINE-OLMESARTAN) 1 by mouth qd JANUVIA 100 MG  TABS (SITAGLIPTIN PHOSPHATE) 1 by mouth qd VICODIN HP 10-660 MG  TABS (HYDROCODONE-ACETAMINOPHEN) 1 by mouth qid prn ECOTRIN LOW STRENGTH 81 MG  TBEC (ASPIRIN) 1po qd FIORICET 50-325-40 MG  TABS (BUTALBITAL-APAP-CAFFEINE) 1 by mouth qd ZOLPIDEM TARTRATE 10 MG  TABS (ZOLPIDEM TARTRATE)  CIPROFLOXACIN HCL 500 MG  TABS (CIPROFLOXACIN HCL) 1 by mouth bid   Past Medical History:    Reviewed history from 08/13/2007 and no changes required:       Anxiety       Coronary artery disease       Diabetes mellitus, type II       Hyperlipidemia       Hypertension       morbid obesity       migraine       OAB       Anemia-NOS       hx of pos PPD       Depression  Past Surgical History:    Reviewed history from 08/13/2007 and no changes required:       s/p left knee replacement       s/p right knee replacement   Family History:    Reviewed history from 08/13/2007 and no changes required:       Family History of CAD Female 1st degree relative       Family History Diabetes 1st degree relative       Family History Hypertension  Social History:    Reviewed history from 08/13/2007 and no changes required:       Former Smoker       Alcohol use-no    Review of Systems  The patient denies anorexia, fever, weight loss, vision loss, decreased hearing, hoarseness, chest pain, syncope, dyspnea on exhertion, peripheral edema, prolonged cough, hemoptysis, abdominal pain, melena, hematochezia, severe indigestion/heartburn,  hematuria, incontinence, genital sores, muscle weakness, suspicious skin lesions, transient blindness, difficulty walking, depression, unusual weight change, abnormal bleeding, enlarged lymph nodes, angioedema, and breast masses.     Physical Exam  General:     Well-developed,well-nourished,in no acute distress; alert,appropriate and cooperative throughout examination Head:     Normocephalic and atraumatic without obvious abnormalities. No apparent alopecia or balding. Eyes:     No corneal or conjunctival inflammation noted. EOMI. Perrla. Ears:     External ear exam shows no significant lesions or deformities.  Otoscopic examination reveals clear canals, tympanic membranes are intact bilaterally without bulging, retraction, inflammation or discharge. Hearing is grossly normal bilaterally. Nose:     External nasal examination shows no deformity or inflammation. Nasal mucosa are pink and moist without lesions or exudates. Mouth:     Oral mucosa and oropharynx without lesions or exudates.   Teeth in good repair. Neck:     No deformities, masses, or tenderness noted. Lungs:     Normal respiratory effort, chest expands symmetrically. Lungs are clear to auscultation, no crackles or wheezes. Heart:     Normal rate and regular rhythm. S1 and S2 normal without gallop, murmur, click, rub or other extra sounds. Abdomen:     mild to mod LLQ tender, o/w benign Extremities:     No clubbing, cyanosis, edema, or deformity noted with normal full range of motion of all joints.   Psych:     depressed affect, flat affect, and subdued.      Impression & Recommendations:  Problem # 1:  ABDOMINAL PAIN, LEFT LOWER QUADRANT (ICD-789.04) ? diverticulitis vs other - decline ct - tx empiric with cipro course, check cbc and ua and cx, refer GI - needs screening colonscopy anyway Orders: T-Culture, Urine [id number]) TLB-Udip w/ Micro (81001-URINE) Gastroenterology Referral (GI) TLB-CBC Platelet - w/Differential (85025-CBCD) TLB-Hepatic/Liver Function Pnl (80076-HEPATIC) TLB-BMP (Basic Metabolic Panel-BMET) (99991111) TLB-Lipase (83690-LIPASE)   Problem # 2:  DIABETES MELLITUS, TYPE II (ICD-250.00)  The following medications were removed from the medication list:    Lantus 100 Unit/ml Soln (Insulin glargine)  Her updated medication list for this problem includes:    Metformin Hcl 500 Mg Tb24 (Metformin hcl) .Marland Kitchen... 1 by mouth bid    Januvia 100 Mg Tabs (Sitagliptin phosphate) .Marland Kitchen... 1 by mouth qd    Ecotrin Low Strength 81 Mg Tbec (Aspirin) .Marland Kitchen... 1po once daily  check labs  The following medications were removed from the medication list:    Lantus 100 Unit/ml Soln (Insulin glargine)  Her updated medication list for this problem includes:    Metformin Hcl 500 Mg Tb24 (Metformin hcl) .Marland Kitchen... 1 by mouth bid    Januvia 100 Mg Tabs (Sitagliptin phosphate) .Marland Kitchen... 1 by mouth qd    Ecotrin Low Strength 81 Mg Tbec (Aspirin) .Marland Kitchen... 1po qd  Orders: TLB-A1C / Hgb A1C (Glycohemoglobin)  (83036-A1C) TLB-Lipid Panel (80061-LIPID)   Problem # 3:  DEPRESSION (ICD-311)  Her updated medication list for this problem includes:    Fluoxetine Hcl 40 Mg Caps (Fluoxetine hcl) .Marland Kitchen... 1 by mouth qd re-start tx as above, seems to have limited insight, and is hindering her acceptance of medical recomendations  Problem # 4:  COMMON MIGRAINE (ICD-346.10)  Her updated medication list for this problem includes:    Metoprolol Tartrate 100 Mg Tabs (Metoprolol tartrate) .Marland Kitchen... 1 by mouth bid    Vicodin Hp 10-660 Mg Tabs (Hydrocodone-acetaminophen) .Marland Kitchen... 1 by mouth qid prn    Ecotrin Low Strength 81 Mg Tbec (Aspirin) .Marland KitchenMarland KitchenMarland KitchenMarland Kitchen  1po qd    Fioricet 50-325-40 Mg Tabs (Butalbital-apap-caffeine) .Marland Kitchen... 1 by mouth qd  axert stopped - to use fioricet sparingly - up to 2 times per week only  Problem # 5:  INSOMNIA-SLEEP DISORDER-UNSPEC (ICD-307.40) tx with zolpidem at bedtime prn  Complete Medication List: 1)  Fluoxetine Hcl 40 Mg Caps (Fluoxetine hcl) .Marland Kitchen.. 1 by mouth qd 2)  Furosemide 80 Mg Tabs (Furosemide) .Marland Kitchen.. 1 by mouth qd 3)  Premarin 1.25 Mg Tabs (Estrogens conjugated) .Marland Kitchen.. 1 by mouth qd 4)  Metformin Hcl 500 Mg Tb24 (Metformin hcl) .Marland Kitchen.. 1 by mouth bid 5)  Lipitor 80 Mg Tabs (Atorvastatin calcium) .Marland Kitchen.. 1 by mouth qd 6)  Climara 0.05 Mg/24hr Ptwk (Estradiol) .Marland Kitchen.. 1 patch q wk 7)  Vesicare 5 Mg Tabs (Solifenacin succinate) .Marland Kitchen.. 1 by mouth qd 8)  Imdur 30 Mg Tb24 (Isosorbide mononitrate) .Marland Kitchen.. 1 by mouth qd 9)  Metoprolol Tartrate 100 Mg Tabs (Metoprolol tartrate) .Marland Kitchen.. 1 by mouth bid 10)  Clonidine Hcl 0.1 Mg Tabs (Clonidine hcl) .Marland Kitchen.. 1 by mouth bid 11)  Azor 10-40 Mg Tabs (Amlodipine-olmesartan) .Marland Kitchen.. 1 by mouth qd 12)  Januvia 100 Mg Tabs (Sitagliptin phosphate) .Marland Kitchen.. 1 by mouth qd 13)  Vicodin Hp 10-660 Mg Tabs (Hydrocodone-acetaminophen) .Marland Kitchen.. 1 by mouth qid prn 14)  Ecotrin Low Strength 81 Mg Tbec (Aspirin) .Marland Kitchen.. 1po qd 15)  Fioricet 50-325-40 Mg Tabs (Butalbital-apap-caffeine) .Marland Kitchen.. 1 by mouth  qd 16)  Zolpidem Tartrate 10 Mg Tabs (Zolpidem tartrate) 17)  Ciprofloxacin Hcl 500 Mg Tabs (Ciprofloxacin hcl) .Marland Kitchen.. 1 by mouth bid  Other Orders: Flu Vaccine 30yrs + [id number]) Admin 1st Vaccine [id number])   Patient Instructions: 1)  Take prozac for mood 2)  Tak zolpidem for sleep as needed 3)  take fioricet as needed for migraine 4)  take cipro for the abd pain 5)  To get blood and urine tests today 6)  You will be contacted regarding the GI referral 7)  To get flu shot today 8)  Please schedule a follow-up appointment in 2 months for CPX with labs prior (and HGBA1c 250.02)    Prescriptions: CIPROFLOXACIN HCL 500 MG  TABS (CIPROFLOXACIN HCL) 1 by mouth bid  #20 x 0   Entered and Authorized by:   Biagio Borg MD   Signed by:   Biagio Borg MD on 09/01/2007   Method used:   Electronically sent to ...       [facility], [place]  [address]       Ph: [phone]       Fax: [phone]   RxID:   [id number] FLUOXETINE HCL 40 MG  CAPS (FLUOXETINE HCL) 1 by mouth qd  #90 x 3   Entered and Authorized by:   Biagio Borg MD   Signed by:   Biagio Borg MD on 09/01/2007   Method used:   Electronically sent to ...       [facility], [place]  [address]       Ph: [phone]       Fax: [phone]   RxID:   [id number] ZOLPIDEM TARTRATE 10 MG  TABS (ZOLPIDEM TARTRATE)   #30 x 5   Entered and Authorized by:   Biagio Borg MD   Signed by:   Biagio Borg MD on 09/01/2007  Method used:   Print then Give to Patient   RxID:   [id number] FIORICET 50-325-40 MG  TABS [facility]) 1 by mouth qd  #10 x 0   Entered and Authorized by:   Biagio Borg MD   Signed by:   Biagio Borg MD on 09/01/2007   Method used:   Print then Give to Patient   [id number]  ]  Influenza Vaccine    Vaccine Type: Fluvax 3+    Site: left deltoid    Mfr: [place]    Dose: 0.5 ml    Route:  IM    Given by: Elveria Royals    Exp. Date: 04/05/2008    Lot #: [id number]  Flu Vaccine Consent Questions    Do you have a history of severe allergic reactions to this vaccine? no    Any prior history of allergic reactions to egg and/or gelatin? no    Do you have a sensitivity to the preservative Thimersol? no    Do you have a past history of Guillan-Barre Syndrome? no    Do you currently have an acute febrile illness? no    Have you ever had a severe reaction to latex? no    Vaccine information given and explained to patient? yes    Are you currently pregnant? no

## 2010-11-06 NOTE — Miscellaneous (Signed)
  Clinical Lists Changes  Medications: Added new medication of CELEBREX 200 MG  CAPS (CELECOXIB) 1 by mouth qd - Signed Added new medication of ZOLOFT 50 MG  TABS (SERTRALINE HCL) 1 by mouth qd - Signed Rx of CELEBREX 200 MG  CAPS (CELECOXIB) 1 by mouth qd;  #90 x 4;  Signed;  Entered by: Elveria Royals;  Authorized by: Biagio Borg MD;  Method used: Print then Give to Patient Rx of ZOLOFT 50 MG  TABS (SERTRALINE HCL) 1 by mouth qd;  #90 x 4;  Signed;  Entered by: Elveria Royals;  Authorized by: Biagio Borg MD;  Method used: Print then Give to Patient Rx of CLONIDINE HCL 0.1 MG  TABS (CLONIDINE HCL) 1 by mouth bid;  #120 x 4;  Signed;  Entered by: Elveria Royals;  Authorized by: Biagio Borg MD;  Method used: Print then Give to Patient    Prescriptions: CLONIDINE HCL 0.1 MG  TABS (CLONIDINE HCL) 1 by mouth bid  #120 x 4   Entered by:   Elveria Royals   Authorized by:   Biagio Borg MD   Signed by:   Elveria Royals on 10/28/2007   Method used:   Print then Give to Patient   RxID:   [id number] ZOLOFT 50 MG  TABS (SERTRALINE HCL) 1 by mouth qd  #90 x 4   Entered by:   Elveria Royals   Authorized by:   Biagio Borg MD   Signed by:   Elveria Royals on 10/28/2007   Method used:   Print then Give to Patient   RxID:   [id number] CELEBREX 200 MG  CAPS (CELECOXIB) 1 by mouth qd  #90 x 4   Entered by:   Elveria Royals   Authorized by:   Biagio Borg MD   Signed by:   Elveria Royals on 10/28/2007   Method used:   Print then Give to Patient   RxID:   [id number]   Appended Document:  [facility] called to verfiy the strenght  of the celebrex gave the directionsdd

## 2010-11-06 NOTE — Progress Notes (Signed)
Summary: Med refill   Phone Note From Pharmacy   Caller: [facility] [phone] 9-5PM [id number] Call For: DR Fuller Plan  Summary of Call: Pt requesting a renewal on isosorbide 10mg . Initial call taken by: Irwin Brakeman [facility],  August 04, 2009 9:13 AM  Follow-up for Phone Call        Called [facility] to inform them we are not the prescriber for this medication. I told them to contact Dr. Cathlean Cower office to get this medication filled.  Follow-up by: Marlon Pel CMA Deborra Medina),  August 04, 2009 10:33 AM

## 2010-11-06 NOTE — Progress Notes (Signed)
Summary: No Labs and  Req for Darvocet   Phone Note Outgoing Call   Call placed by: Bernita Buffy CMA Deborra Medina),  May 02, 2009 10:03 AM Summary of Call: called the patient to ask if she planned on getting her labs done, she states she still has the order but she was tired the day she came and she plans on coming back sometime to get them drawn. She states that she is still having alot of LLQ pain which is no better than when she saw Nicoletta Ba, PA. Amy gave her darvocet #40 on 03-15-2009, patient states that they helped and she was wanting to get a refill of Darvocet.  I advised her I will ask Dr. Fuller Plan and call her back. Initial call taken by: Bernita Buffy CMA ([place]),  May 02, 2009 10:08 AM  Follow-up for Phone Call        Schedule Abd/Pelvic CT for LLQ pain Blood work needs to be done Refill Darvocet X 1 Follow-up by: Ladene Artist MD Marval Regal,  May 02, 2009 11:12 AM  Additional Follow-up for Phone Call Additional follow up Details #1::        patient aware of ct and she must have labs, rx faxed to pharm.  Additional Follow-up by: Bernita Buffy CMA ([facility]),  May 02, 2009 11:25 AM    Prescriptions: DARVOCET-N 100 100-650 MG TABS (PROPOXYPHENE N-APAP) Take 1 tab every 4 hours as needed for pain  #40 x 1   Entered by:   Bernita Buffy CMA ([place])   Authorized by:   Ladene Artist MD [facility]   Signed by:   Bernita Buffy CMA ([place]) on 05/02/2009   Method used:   Printed then faxed to ...       [facility] [address] [phone]* (retail)       [address]       [place], [place]  [address]       Ph: [phone] or [phone]       Fax: [phone]   RxID:   [id number]
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
## 2010-11-06 NOTE — Letter (Signed)
Summary: [facility]   Imported By: Phillis Knack 05/14/2010 11:36:46  _____________________________________________________________________  External Attachment:    Type:   Image     Comment:   External Document

## 2010-11-06 NOTE — Procedures (Signed)
Summary: EGD Report  EGD Report   Imported By: Jerrye Noble D'jimraou 09/28/2007 16:02:54  _____________________________________________________________________  External Attachment:    Type:   Image     Comment:   External Document

## 2010-11-06 NOTE — Assessment & Plan Note (Signed)
Summary: FU---$50---STC   Vital Signs:  Patient Profile:   60 Years Old Female Weight:      255 pounds O2 Sat:      98 % O2 treatment:    Room Air Temp:     98.0 degrees F oral Pulse rate:   99 / minute BP sitting:   130 / 76  (left arm) Cuff size:   large  Vitals Entered By: Sherlean Foot CMA (October 28, 2008 10:58 AM)                 Chief Complaint:  f/u.  History of Present Illness: here with pain to the left neck with radiation towards the distal arm without signifc swelling or erythema, no fall or trauma; may have some weakness per pt,overall mod to severe for 4days; has seen dr graves in the past for the right shoulder; also has some pain to abduction of the left arm as well ongoing even before the neck and radicular pain; denies polys or low sugars, normally sees dr Electa Sniff for DM; denies CP, sob, doe, orthopnea, pnd  or LE edema    Prior Medications Reviewed Using: Patient Recall  Updated Prior Medication List: METFORMIN HCL 500 MG TB24 (METFORMIN HCL) 1 by mouth bid LIPITOR 80 MG  TABS (ATORVASTATIN CALCIUM) 1 by mouth qd VESICARE 5 MG  TABS (SOLIFENACIN SUCCINATE) 1 by mouth qd ISOSORBIDE DINITRATE 10 MG  TABS (ISOSORBIDE DINITRATE) 1 by mouth bid METOPROLOL TARTRATE 100 MG  TABS (METOPROLOL TARTRATE) 1 by mouth bid CLONIDINE HCL 0.1 MG  TABS (CLONIDINE HCL) 1 by mouth bid EXFORGE 10-320 MG TABS (AMLODIPINE BESYLATE-VALSARTAN) 1 by mouth once daily JANUVIA 100 MG  TABS (SITAGLIPTIN PHOSPHATE) 1 by mouth qd ECOTRIN LOW STRENGTH 81 MG  TBEC (ASPIRIN) 1po qd FIORICET 50-325-40 MG  TABS (BUTALBITAL-APAP-CAFFEINE) 1 by mouth once daily as needed ZOLPIDEM TARTRATE 10 MG  TABS (ZOLPIDEM TARTRATE) 1 by mouth at bedtime prn CELEBREX 200 MG  CAPS (CELECOXIB) 1 by mouth qd ZOLOFT 50 MG  TABS (SERTRALINE HCL) 1 by mouth qd NOVOLOG FLEXPEN 100 UNIT/ML  SOLN (INSULIN ASPART) use asd qid sliding scale * NOVOLOG FLEXPEN NEEDLE use asd qid with sliding scale ESTRADIOL 0.1  MG/24HR PTWK (ESTRADIOL) use asd 1 patch q wk HYDROCODONE-ACETAMINOPHEN 5-325 MG TABS (HYDROCODONE-ACETAMINOPHEN) 1po q 6 hrs as needed pain 60  Current Allergies (reviewed today): ! PCN  Past Medical History:    Reviewed history from 06/29/2008 and no changes required:       Anxiety       Coronary artery disease       Diabetes mellitus, type II - per dr Electa Sniff       Hyperlipidemia       Hypertension       morbid obesity       migraine       OAB       Anemia-NOS       hx of pos PPD       Depression       Hiatal Hernia       Gastric ulcer       Esophagitis       Diverticulitis       Hemorrhoids       Diverticulosis  Past Surgical History:    Reviewed history from 06/29/2008 and no changes required:       s/p left knee replacement       s/p right knee replacement with revision 7/09 - dr graves  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   Head:     Normocephalic and atraumatic without obvious abnormalities. No apparent alopecia or balding. Eyes:     No corneal or conjunctival inflammation noted. EOMI. Perrla.  Ears:     External ear exam shows no significant lesions or deformities.  Otoscopic examination reveals clear canals, tympanic membranes are intact bilaterally without bulging, retraction, inflammation or discharge. Hearing is grossly normal bilaterally. Nose:     External nasal examination shows no deformity or inflammation. Nasal mucosa are pink and moist without lesions or exudates. Mouth:     Oral mucosa and oropharynx without lesions or exudates.  Teeth in good repair. Neck:     No deformities, masses, or tenderness noted. Lungs:     Normal respiratory  effort, chest expands symmetrically. Lungs are clear to auscultation, no crackles or wheezes. Heart:     regular rhythm and no murmur.   Abdomen:     Bowel sounds positive,abdomen soft and non-tender without masses, organomegaly or hernias noted. Msk:     tender left subacromial bursa as well as the rotater cuff and left trapezius;  Extremities:     no edema, no ulcers  Neurologic:     cranial nerves II-XII intact and strength normal in all extremities except trace weakness to distal LUEcranial nerves II-XII intact and strength normal in all extremities.      Impression & Recommendations:  Problem # 1:  CERVICAL RADICULOPATHY, LEFT (ICD-723.4) for pain med, c-spine MRI, refer dr Delfin Gant ortho,  Orders: Radiology Referral (Radiology) Orthopedic Surgeon Referral (Ortho Surgeon)   Problem # 2:  SHOULDER PAIN, LEFT (ICD-719.41)  The following medications were removed from the medication list:    Vicodin Hp 10-660 Mg Tabs (Hydrocodone-acetaminophen) .Marland Kitchen... 1 by mouth qid prn    Flexeril 5 Mg Tabs (Cyclobenzaprine hcl) .Marland Kitchen... 1po three times a day prn  Her updated medication list for this problem includes:    Ecotrin Low Strength 81 Mg Tbec (Aspirin) .Marland Kitchen... 1po qd    Fioricet 50-325-40 Mg Tabs (Butalbital-apap-caffeine) .Marland Kitchen... 1 by mouth once daily as needed    Celebrex 200 Mg Caps (Celecoxib) .Marland Kitchen... 1 by mouth qd    Hydrocodone-acetaminophen 5-325 Mg Tabs (Hydrocodone-acetaminophen) .Marland Kitchen... 1po q 6 hrs as needed pain 60 also suspect some bursitis/rotater cuff - mild to mod, tx as above, fuu ortho Orders: EKG w/ Interpretation (93000)   Problem # 3:  HYPERTENSION (ICD-401.9)  Her updated medication list for this problem includes:    Metoprolol Tartrate 100 Mg Tabs (Metoprolol tartrate) .Marland Kitchen... 1 by mouth bid    Clonidine Hcl 0.1 Mg Tabs (Clonidine hcl) .Marland Kitchen... 1 by mouth bid    Exforge 10-320 Mg Tabs (Amlodipine besylate-valsartan) .Marland Kitchen... 1 by mouth once daily  BP today: 130/76 Prior  BP: 126/80 (06/29/2008)  Labs Reviewed: Creat: 1.0 (12/21/2007) Chol: 223 (09/01/2007)   HDL: 49.1 (09/01/2007)   LDL: 153.0 (09/01/2007)   TG: 146 (09/01/2007) stable overall by hx and exam, ok to continue meds/tx as is   Problem # 4:  DIABETES MELLITUS, TYPE II (  ICD-250.00)  Her updated medication list for this problem includes:    Metformin Hcl 500 Mg Tb24 (Metformin hcl) .Marland Kitchen... 1 by mouth bid    Januvia 100 Mg Tabs (Sitagliptin phosphate) .Marland Kitchen... 1 by mouth qd    Ecotrin Low Strength 81 Mg Tbec (Aspirin) .Marland Kitchen... 1po qd    Novolog Flexpen 100 Unit/ml Soln (Insulin aspart) ..... Use asd qid sliding scale has regular f/u with dr gegick/endo soon per pt  Complete Medication List: 1)  Metformin Hcl 500 Mg Tb24 (Metformin hcl) .Marland Kitchen.. 1 by mouth bid 2)  Lipitor 80 Mg Tabs (Atorvastatin calcium) .Marland Kitchen.. 1 by mouth qd 3)  Vesicare 5 Mg Tabs (Solifenacin succinate) .Marland Kitchen.. 1 by mouth qd 4)  Isosorbide Dinitrate 10 Mg Tabs (Isosorbide dinitrate) .Marland Kitchen.. 1 by mouth bid 5)  Metoprolol Tartrate 100 Mg Tabs (Metoprolol tartrate) .Marland Kitchen.. 1 by mouth bid 6)  Clonidine Hcl 0.1 Mg Tabs (Clonidine hcl) .Marland Kitchen.. 1 by mouth bid 7)  Exforge 10-320 Mg Tabs (Amlodipine besylate-valsartan) .Marland Kitchen.. 1 by mouth once daily 8)  Januvia 100 Mg Tabs (Sitagliptin phosphate) .Marland Kitchen.. 1 by mouth qd 9)  Ecotrin Low Strength 81 Mg Tbec (Aspirin) .Marland Kitchen.. 1po qd 10)  Fioricet 50-325-40 Mg Tabs (Butalbital-apap-caffeine) .Marland Kitchen.. 1 by mouth once daily as needed 11)  Zolpidem Tartrate 10 Mg Tabs (Zolpidem tartrate) .Marland Kitchen.. 1 by mouth at bedtime prn 12)  Celebrex 200 Mg Caps (Celecoxib) .Marland Kitchen.. 1 by mouth qd 13)  Zoloft 50 Mg Tabs (Sertraline hcl) .Marland Kitchen.. 1 by mouth qd 14)  Novolog Flexpen 100 Unit/ml Soln (Insulin aspart) .... Use asd qid sliding scale 15)  Novolog Flexpen Needle  .... Use asd qid with sliding scale 16)  Estradiol 0.1 Mg/24hr Ptwk (Estradiol) .... Use asd 1 patch q wk 17)  Hydrocodone-acetaminophen 5-325 Mg Tabs (Hydrocodone-acetaminophen) .Marland Kitchen.. 1po q  6 hrs as needed pain 60   Patient Instructions: 1)  Please take all new medications as prescribed 2)  Continue all medications that you may have been taking previously 3)  You will be contacted about the referral(s) to: MRI for the neck, and the referral to Dr Berenice Primas 4)  Your EKG was good today 5)  Continue all medications that you may have been taking previously 6)  Please Keep your regular Appointments with Dr Electa Sniff as you do 7)  Please schedule a follow-up appointment as needed.   Prescriptions: FIORICET 50-325-40 MG  TABS (BUTALBITAL-APAP-CAFFEINE) 1 by mouth once daily as needed  #30 x 0   Entered and Authorized by:   Biagio Borg MD   Signed by:   Biagio Borg MD on 10/28/2008   Method used:   Print then Give to Patient   RxID:   [id number] HYDROCODONE-ACETAMINOPHEN 5-325 MG TABS (HYDROCODONE-ACETAMINOPHEN) 1po q 6 hrs as needed pain 60  #60 x 0   Entered and Authorized by:   Biagio Borg MD   Signed by:   Biagio Borg MD on 10/28/2008   Method used:   Print then Give to Patient   RxID:   [id number]

## 2010-11-06 NOTE — Letter (Signed)
Summary: [facility]   Imported By: Phillis Knack 03/03/2009 09:31:14  _____________________________________________________________________  External Attachment:    Type:   Image     Comment:   External Document

## 2010-11-06 NOTE — Letter (Signed)
Summary: [facility]   Imported By: Marilynne Drivers 05/31/2010 10:52:27  _____________________________________________________________________  External Attachment:    Type:   Image     Comment:   External Document

## 2010-11-06 NOTE — Letter (Signed)
Summary: Diabetic Eye Exam/[facility]  Diabetic Eye Exam/[facility]   Imported By: Jerrye Noble D'jimraou 10/06/2008 15:36:21  _____________________________________________________________________  External Attachment:    Type:   Image     Comment:   External Document

## 2010-11-06 NOTE — Letter (Signed)
Summary: [facility]   Imported By: Bubba Hales 12/23/2008 09:05:11  _____________________________________________________________________  External Attachment:    Type:   Image     Comment:   External Document

## 2010-11-06 NOTE — Progress Notes (Signed)
  Phone Note Refill Request  on November 06, 2009 11:50 AM  Refills Requested: Medication #1:  VESICARE 5 MG  TABS 1 by mouth qd   Dosage confirmed as above?Dosage Confirmed   Notes: [facility] Initial call taken by: Sharon Seller,  November 06, 2009 11:50 AM    Prescriptions: VESICARE 5 MG  TABS (SOLIFENACIN SUCCINATE) 1 by mouth qd  #90 x 0   Entered by:   Sharon Seller   Authorized by:   Biagio Borg MD   Signed by:   Sharon Seller on 11/06/2009   Method used:   Faxed to ...       [facility] (mail-order)             ,          Ph: [phone]       Fax: [phone]   RxID:   [id number]

## 2010-11-06 NOTE — Letter (Signed)
Summary: [name]   Imported By: Edmonia  12/07/2008 09:36:54  _____________________________________________________________________  External Attachment:    Type:   Image     Comment:   External Document

## 2010-11-06 NOTE — Progress Notes (Signed)
Summary: triage   Phone Note Call from Patient Call back at Home Phone [phone]   Caller: Patient Call For: Lio Wehrly Reason for Call: Talk to Nurse Summary of Call: Patient would like to be seen sooner than first available 7-19 due to constant stomach pain and worst after meals. Initial call taken by: Ronalee Red,  March 10, 2009 3:32 PM  Follow-up for Phone Call        Spoke with pt. She states she is having LLQ abd pain for several weeks which is eased  some with Darvocet-N. No diarrhea, no constipation, no fever.She  has been nauseated which is worst past eating and comes and goes.She has  a constant burning pain in sternum area, with belching and a sour taste in her mouth ,no vomiting, no blood She is taking Reglan 30 minutes before each meal and Nexium bid  and Peptol Bismal  as needed. Will work pt in on Wednesday, 03-15-09 at 8:30am with Amy-PA . Pt was advised to continue taking her meds, stay on liquida  and call back if symptoms get worse  Cornelia Copa RN  March 10, 2009 3:51 PM

## 2010-11-06 NOTE — Letter (Signed)
Summary: [facility]   Imported By: Phillis Knack 08/17/2009 10:54:52  _____________________________________________________________________  External Attachment:    Type:   Image     Comment:   External Document

## 2010-11-06 NOTE — Consult Note (Signed)
Summary: [facility] By: Jerrye Noble D'jimraou 07/25/2008 13:34:06  _____________________________________________________________________  External Attachment:    Type:   Image     Comment:   External Document

## 2010-11-06 NOTE — Letter (Signed)
Summary: [facility]   Imported By: Phillis Knack 07/25/2009 14:01:27  _____________________________________________________________________  External Attachment:    Type:   Image     Comment:   External Document

## 2010-11-06 NOTE — Progress Notes (Signed)
Summary: right pt /bp problems  Phone Note Call from Patient Call back at Home Phone [phone]   Caller: Patient/[phone] Call For: dr Jenny Reichmann Summary of Call: per [facility] nurse pt bp is  170/96  pain level 10 took all meds at 11:00  pulse is 100 req getting higher, does dr Jenny Reichmann want to see pt Initial call taken by: Nonah Mattes,  May 10, 2008 11:21 AM   Initial call taken by: Nonah Mattes,  May 10, 2008 1:33 PM  Follow-up for Phone Call        Follow-up for Phone Call         very sorry, but I have never seen this patient according to my EMR and the hospital visit record - I do not believe i am the primary MD; please see primary MD Follow-up by: Biagio Borg MD,  May 10, 2008 1:16 PM        Additional Follow-up for Phone Call Additional follow up Details #1::        May 10, 2008 1:32 PM this is the correct pt , had the wrong pt before Additional Follow-up by: Nonah Mattes,  May 10, 2008 1:32 PM    Additional Follow-up for Phone Call Additional follow up Details #2::    looks like will need ov Follow-up by: Biagio Borg MD,  May 10, 2008 2:54 PM  Additional Follow-up for Phone Call Additional follow up Details #3:: Details for Additional Follow-up Action Taken: May 10, 2008 3:24 PM  called pt  pt states she would call back to make ov Additional Follow-up by: Nonah Mattes,  May 10, 2008 3:24 PM

## 2010-11-06 NOTE — Medication Information (Signed)
Summary: [facility]  [facility]   Imported By: Bubba Hales 07/26/2008 11:09:40  _____________________________________________________________________  External Attachment:    Type:   Image     Comment:   External Document

## 2010-11-06 NOTE — Miscellaneous (Signed)
Summary: Orders Update   Clinical Lists Changes  Orders: Added new Referral order of Radiology Referral (Radiology) - Signed 

## 2010-11-06 NOTE — Letter (Signed)
Summary: [facility] Consult Scheduled Letter  [facility]   [place], [place] [address]   Phone: [phone]  Fax: [phone]      12/05/2008 MRN: [id number]  [facility] [address] [place], [place]  [address]    Dear Ms. Schaafsma,      We have scheduled an appointment for you.  At the recommendation of Dr.John, we have scheduled you a consult with [facility] on 12/12/08 at 3:15pm.  Their phone number is [phone].  If this appointment day and time is not convenient for you, please feel free to call the office of the doctor you are being referred to at the number listed above and reschedule the appointment.     [facility] [place],  [address]    Thank you,  Patient Care Coordinator [facility]

## 2010-11-06 NOTE — Assessment & Plan Note (Signed)
Summary: 6 WEEK F/U..AM.   History of Present Illness Visit Type: follow up  Primary GI MD: Joylene Igo MD [facility] Primary Provider: Cathlean Cower, MD Requesting Provider: n/a Chief Complaint: Six week f/u for black tarry stools and abd pain. Pt states that her stool is better but she is still having lower abd pain  History of Present Illness:   Mrs. Hillen returns with persistent problems with lower abdominal pain that waxes and wanes. It has not been associated with any digestive function. A small bowel series was unremarkable. Last upper endoscopy in June 2010 with retained gastric solids. Last colonoscopy December 2008 with diverticulosis and hemorrhoids. She states her lower abdominal pain will sometimes improve with voiding. She was seen in the emergency room on 12/7 and gave a history of taking Pepto-Bismol. Stool was Hemoccult negative. CBC was unremarkable except for a hemoglobin of 11.4. Basic metabolic panel and urinalysis were unremarkable. A CT scan of the abdomen and pelvis did not show any acute abnormalities in the abdomen or pelvis.    GI Review of Systems    Reports abdominal pain.     Location of  Abdominal pain: lower abdomen.    Denies acid reflux, belching, bloating, chest pain, dysphagia with liquids, dysphagia with solids, heartburn, loss of appetite, nausea, vomiting, vomiting blood, weight loss, and  weight gain.        Denies anal fissure, black tarry stools, change in bowel habit, constipation, diarrhea, diverticulosis, fecal incontinence, heme positive stool, hemorrhoids, irritable bowel syndrome, jaundice, light color stool, liver problems, rectal bleeding, and  rectal pain.   Current Medications (verified): 1)  Metformin Hcl 500 Mg Tb24 (Metformin Hcl) .Marland Kitchen.. 1 By Mouth Two Times A Day 2)  Lipitor 80 Mg  Tabs (Atorvastatin Calcium) .Marland Kitchen.. 1 By Mouth Once Daily 3)  Vesicare 5 Mg  Tabs (Solifenacin Succinate) .Marland Kitchen.. 1 By Mouth Qd 4)  Isosorbide Dinitrate 10 Mg  Tabs  (Isosorbide Dinitrate) .Marland Kitchen.. 1 By Mouth Two Times A Day 5)  Metoprolol Tartrate 100 Mg  Tabs (Metoprolol Tartrate) .... 1/2  By Mouth Two Times A Day 6)  Exforge 10-320 Mg Tabs (Amlodipine Besylate-Valsartan) .Marland Kitchen.. 1 By Mouth Once Daily 7)  Januvia 100 Mg  Tabs (Sitagliptin Phosphate) .Marland Kitchen.. 1 By Mouth Once Daily 8)  Ecotrin Low Strength 81 Mg  Tbec (Aspirin) .Marland Kitchen.. 1po Qd 9)  Zolpidem Tartrate 10 Mg  Tabs (Zolpidem Tartrate) .Marland Kitchen.. 1 By Mouth At Bedtime As Needed 10)  Zoloft 50 Mg  Tabs (Sertraline Hcl) .Marland Kitchen.. 1 By Mouth Once Daily - Generic Ok 11)  Estradiol 1 Mg Tabs (Estradiol) .Marland Kitchen.. 1 By Mouth Once Daily 12)  Reglan 10 Mg Tabs (Metoclopramide Hcl) .... Take 1 Tab 30 Min Prior To Meals 13)  Promethazine Hcl 25 Mg Tabs (Promethazine Hcl) .... Take 1 Tab Ebery 6 Hours As Needed For Nausea 14)  Iron 325 (65 Fe) Mg Tabs (Ferrous Sulfate) .Marland Kitchen.. 1 By Mouth Qd 15)  Prilosec 40 Mg Cpdr (Omeprazole) .... One Tablet By Mouth Once Daily 16)  Oxycodone-Acetaminophen 5-325 Mg Tabs (Oxycodone-Acetaminophen) .... Take 1-2 Tablets Every 6 Hours As Needede 17)  Carafate 1 Gm/39ml Susp (Sucralfate) .... One Gram Three Times A Day Before Meals 18)  Hyoscyamine Sulfate 0.125 Mg Subl (Hyoscyamine Sulfate) .Marland Kitchen.. 1-2 Tablets By Mouth Under Tongue Every 4 Hours As Needed  Allergies (verified): 1)  ! Pcn 2)  ! Hydrocodone  Past History:  Past Medical History: Reviewed history from 09/13/2009 and no changes required. 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 Left knee replacement Right knee replacement with revision 04/2008 - dr graves Gastric wedge resection of a  gastric lipoma x 2 with laparotomy and gastrostomy 11/2007 right rotater cuff repair - 01/2009 Cholecystectomy 06/2009  Family History: Reviewed history from 03/15/2009 and no changes required. Family History of CAD Female 1st degree relative Family History Diabetes 1st degree relative: Brother x3, Sister x 3 Family History Hypertension No FH of Colon Cancer: Family History of Heart Disease: Brother x 2, Sister x 2  Social History: Reviewed history from 03/15/2009 and no changes required. Former Smoker-stopped 30 years ago Alcohol use-no Married Occupation: Disabled Illicit Drug Use - no Patient gets regular exercise.  Review of Systems       The pertinent positives and negatives are noted as above and in the HPI. All other ROS were reviewed and were negative.   Vital Signs:  Patient profile:   60 year old female Height:      66 inches Weight:      269 pounds BMI:     43.57 BSA:     2.27 Pulse rate:   88 / minute Pulse rhythm:   regular BP sitting:   144 / 82  (left arm) Cuff size:   large  Vitals Entered By: Hope Pigeon CMA (October 18, 2009 4:06 PM)  Physical Exam  General:  Well developed, well nourished, no acute distress. obese.   Head:  Normocephalic and atraumatic. Eyes:  PERRLA, no icterus. Mouth:  No deformity or lesions, dentition normal. Lungs:  Clear throughout to auscultation. Heart:  Regular rate and rhythm; no murmurs, rubs,  or bruits. Abdomen:  Soft and nondistended. No masses, hepatosplenomegaly or hernias noted. Normal bowel sounds. Mild to moderate lower abdominal tenderness without rebound or guarding. Psych:  Alert and cooperative. Normal mood and affect.  Impression & Recommendations:  Problem # 1:  ABDOMINAL PAIN OTHER SPECIFIED SITE (ICD-789.09) Lower abdominal pain, without any clear gastrointestinal cause. Rule out gynecologic or genitourinary sources of symptoms.  Patient Instructions: 1)  We will contact you with an appt date and time  to see [facility].  2)  Please contact Dr. Marcheta Grammes office for an appt for ongoing lower abdominal pain.  3)  Please continue current medications.  4)  Copy sent to : Cathlean Cower, MD 5)                          Gracy Racer, MD 6)                          [facility] 7)  The medication list was reviewed and reconciled.  All changed / newly prescribed medications were explained.  A complete medication list was provided to the patient / caregiver.  Appended Document: Orders Update    Clinical Lists Changes  Problems: Added new problem of DYSURIA (ICD-788.1) Orders: Added new Test order of [facility] - Signed

## 2010-11-06 NOTE — Assessment & Plan Note (Signed)
Summary: FU ON MEDS/NWS  #   Vital Signs:  Patient profile:   60 year old female Height:      66 inches Weight:      272.75 pounds BMI:     44.18 O2 Sat:      98 % on Room air Temp:     98.5 degrees F oral Pulse rate:   89 / minute BP sitting:   120 / 72  (left arm) Cuff size:   large  Vitals Entered By: Shirlean Mylar Ewing CMA Deborra Medina) (May 01, 2010 10:21 AM)  O2 Flow:  Room air  Preventive Care Screening  Mammogram:    Date:  10/07/2009    Results:  normal   Last Pneumovax:    Date:  10/07/2008    Results:  given      declines colonoscopy  CC: followup on medications/RE   Primary Care Provider:  Cathlean Cower, MD  CC:  followup on medications/RE.  History of Present Illness: here to f/u; lipitor changed to simvasttin due to cost per Dr Kelly Splinter;  also not on vesicare since bladder surgury dec 2010;  back on levemir for hte past month due to a1c over 8 per pt about one month ago;  Pt denies CP, sob, doe, wheezing, orthopnea, pnd, worsening LE edema, palps, dizziness or syncope  Pt denies new neuro symptoms such as headache, facial or extremity weakness  No OSA symtpoms.  No fever, wt loss, night sweats, loss of appetite or other constitutional symptoms  Ha some ongoing fatigue.  Pt does not want labs todya - to have labs tomorrow per endo.  Pt denies polydipsia, polyuria, or low sugar symptoms such as shakiness improved with eating.  Overall good compliance with meds, trying to follow low chol, DM diet, wt stable, little excercise however   Problems Prior to Update: 1)  Dysuria  (ICD-788.1) 2)  Change in Bowels  (ICD-787.99) 3)  Abdominal Pain Other Specified Site  (ICD-789.09) 4)  Cholelithiasis  (ICD-574.20) 5)  Gastroparesis  (ICD-536.3) 6)  Gerd  (ICD-530.81) 7)  Ulcer-gastric  (ICD-531.9) 8)  Diverticulosis-colon  (ICD-562.10) 9)  Nausea and Vomiting  (ICD-787.01) 10)  Abdominal Pain-epigastric  (ICD-789.06) 11)  Heartburn  (ICD-787.1) 12)  Nausea  (ICD-787.02) 13)   Epigastric Pain  (ICD-789.06) 14)  Preventive Health Care  (ICD-V70.0) 15)  Shoulder Pain, Left  (ICD-719.41) 16)  Cervical Radiculopathy, Left  (ICD-723.4) 17)  Menopausal Disorder  (ICD-627.9) 18)  Abdominal Pain, Generalized  (ICD-789.07) 19)  Laceration  (ICD-879.8) 20)  Contusions, Multiple  (ICD-924.8) 21)  Joint Effusion, Left Knee  (ICD-719.06) 22)  Shoulder Pain, Right  (ICD-719.41) 23)  Insomnia-sleep Disorder-unspec  (ICD-307.40) 24)  Depression  (ICD-311) 25)  Abdominal Pain, Left Lower Quadrant  (ICD-789.04) 26)  Family History Diabetes 1st Degree Relative  (ICD-V18.0) 33)  Family History of Cad Female 1st Degree Relative <50  (ICD-V17.3) 28)  Positive Ppd  (ICD-795.5) 29)  Anemia-nos  (ICD-285.9) 30)  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 1 By Mouth Two Times A Day 2)  Lipitor 80 Mg  Tabs (Atorvastatin Calcium) .Marland Kitchen.. 1 By Mouth Once Daily 3)  Vesicare 5 Mg  Tabs (Solifenacin Succinate) .Marland Kitchen.. 1 By Mouth Qd 4)  Isosorbide Dinitrate 10 Mg  Tabs (Isosorbide Dinitrate) .Marland Kitchen.. 1 By Mouth Two Times A Day 5)  Metoprolol Tartrate 100 Mg  Tabs (Metoprolol Tartrate) .... 1/2  By Mouth Two Times A Day 6)  Exforge 10-320 Mg Tabs (Amlodipine Besylate-Valsartan) .Marland Kitchen.. 1 By Mouth Once Daily 7)  Januvia 100 Mg  Tabs (Sitagliptin Phosphate) .Marland Kitchen.. 1 By Mouth Once Daily 8)  Ecotrin Low Strength 81 Mg  Tbec (Aspirin) .Marland Kitchen.. 1po Qd 9)  Zolpidem Tartrate 10 Mg  Tabs (Zolpidem Tartrate) .Marland Kitchen.. 1 By Mouth At Bedtime As Needed 10)  Zoloft 50 Mg  Tabs (Sertraline Hcl) .Marland Kitchen.. 1 By Mouth Once Daily - Generic Ok 11)  Estradiol 1 Mg Tabs (Estradiol) .Marland Kitchen.. 1 By Mouth Once  Daily 12)  Reglan 10 Mg Tabs (Metoclopramide Hcl) .... Take 1 Tab 30 Min Prior To Meals 13)  Promethazine Hcl 25 Mg Tabs (Promethazine Hcl) .... Take 1 Tab Ebery 6 Hours As Needed For Nausea 14)  Iron 325 (65 Fe) Mg Tabs (Ferrous Sulfate) .Marland Kitchen.. 1 By Mouth Qd 15)  Prilosec 40 Mg Cpdr (Omeprazole) .... One Tablet By Mouth Once Daily 16)  Oxycodone-Acetaminophen 5-325 Mg Tabs (Oxycodone-Acetaminophen) .... Take 1-2 Tablets Every 6 Hours As Needede 17)  Carafate 1 Gm/59ml Susp (Sucralfate) .... One Gram Three Times A Day Before Meals 18)  Hyoscyamine Sulfate 0.125 Mg Subl (Hyoscyamine Sulfate) .Marland Kitchen.. 1-2 Tablets By Mouth Under Tongue Every 4 Hours As Needed  Current Medications (verified): 1)  Metformin Hcl 500 Mg Tb24 (Metformin Hcl) .Marland Kitchen.. 1 By Mouth Two Times A Day 2)  Simvastatin 80 Mg Tabs (Simvastatin) .Marland Kitchen.. 1 By Mouth Once Daily 3)  Isosorbide Dinitrate 10 Mg  Tabs (Isosorbide Dinitrate) .Marland Kitchen.. 1 By Mouth Two Times A Day 4)  Metoprolol Tartrate 50 Mg Tabs (Metoprolol Tartrate) .Marland Kitchen.. 1po Two Times A Day 5)  Exforge 10-320 Mg Tabs (Amlodipine Besylate-Valsartan) .Marland Kitchen.. 1 By Mouth Once Daily 6)  Januvia 100 Mg  Tabs (Sitagliptin Phosphate) .Marland Kitchen.. 1 By Mouth Once Daily 7)  Ecotrin Low Strength 81 Mg  Tbec (Aspirin) .Marland Kitchen.. 1po Qd 8)  Zolpidem Tartrate 10 Mg  Tabs (Zolpidem Tartrate) .Marland Kitchen.. 1 By Mouth At Bedtime As Needed 9)  Zoloft 50 Mg  Tabs (Sertraline Hcl) .Marland Kitchen.. 1 By Mouth Once Daily - Generic Ok 10)  Estradiol 1 Mg Tabs (Estradiol) .Marland Kitchen.. 1 By Mouth Once Daily 11)  Reglan 10 Mg Tabs (Metoclopramide Hcl) .... Take 1 Tab 30 Min Prior To Meals 12)  Promethazine Hcl 25 Mg Tabs (Promethazine Hcl) .... Take 1 Tab Ebery 6 Hours As Needed For Nausea 13)  Iron 325 (65 Fe) Mg Tabs (Ferrous Sulfate) .Marland Kitchen.. 1 By Mouth Qd 14)  Prilosec 40 Mg Cpdr (Omeprazole) .... One Tablet By Mouth Once Daily 15)  Oxycodone-Acetaminophen 5-325 Mg Tabs (Oxycodone-Acetaminophen) .... Take 1-2 Tablets Every 6 Hours As Needede 16)  Carafate  1 Gm/34ml Susp (Sucralfate) .... One Gram Three Times A Day Before Meals 17)  Hyoscyamine Sulfate 0.125 Mg Subl (Hyoscyamine Sulfate) .Marland Kitchen.. 1-2 Tablets By Mouth Under Tongue Every 4 Hours As Needed 18)  Levemir Flexpen 100 Unit/ml Soln (Insulin Detemir) .Marland Kitchen.. 10 Units Subcutaneously Once Daily  Allergies (verified): 1)  ! Pcn 2)  ! 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  Risk Factors: Exercise: yes (03/15/2009)  Risk Factors: Smoking Status: quit (08/13/2007)  Past Surgical History: Left knee replacement Right knee replacement with revision 04/2008 - dr graves Gastric wedge resection of a gastric lipoma x 2 with laparotomy and gastrostomy 11/2007 right rotater cuff repair - 01/2009 Cholecystectomy 06/2009 s/p  bladder surgury dec 2010 - Dr Terance Hart  Review of Systems  The patient denies anorexia, fever, weight loss, vision loss, decreased hearing, hoarseness, chest pain, syncope, dyspnea on exertion, peripheral edema, prolonged cough, headaches, hemoptysis, abdominal pain, melena, hematochezia, severe indigestion/heartburn, hematuria, muscle weakness, suspicious skin lesions, transient  blindness, difficulty walking, depression, abnormal bleeding, enlarged lymph nodes, and angioedema.         all otherwise negative per pt -    Physical Exam  General:  alert and overweight-appearing.   Head:  normocephalic and atraumatic.   Eyes:  vision grossly intact, pupils equal, and pupils round.   Ears:  R ear normal and L ear normal.   Nose:  no external deformity and no nasal discharge.   Mouth:  no gingival abnormalities and pharynx pink and moist.   Neck:  supple and no masses.   Lungs:  normal respiratory effort and normal breath sounds.   Heart:  normal rate and regular rhythm.   Abdomen:  soft, non-tender, and normal bowel sounds.   Msk:  no joint tenderness and no joint swelling.   Extremities:  trace LE edema bilat Neurologic:  cranial nerves II-XII intact and strength normal in all extremities.   Skin:  color normal and no rashes.   Psych:  not anxious appearing and not depressed appearing.     Impression & Recommendations:  Problem # 1:  Preventive Health Care (ICD-V70.0) Overall doing well, age appropriate education and counseling updated and referral for appropriate preventive services done unless declined, immunizations up to date or declined, diet counseling done if overweight, urged to quit smoking if smokes , most recent labs reviewed and current ordered if appropriate, ecg reviewed or declined (interpretation per ECG scanned in the EMR if done); information regarding Medicare Prevention requirements given if appropriate; speciality referrals updated as appropriate   Problem # 2:  GASTROPARESIS (ICD-536.3)  ok for one refill, pt requests referral back to dr stark for f/u  Orders: Gastroenterology Referral (GI)  Complete Medication List: 1)  Metformin Hcl 500 Mg Tb24 (Metformin hcl) .Marland Kitchen.. 1 by mouth two times a day 2)  Simvastatin 80 Mg Tabs (Simvastatin) .Marland Kitchen.. 1 by mouth once daily 3)  Isosorbide Dinitrate 10 Mg Tabs (Isosorbide dinitrate) .Marland Kitchen.. 1 by mouth  two times a day 4)  Metoprolol Tartrate 50 Mg Tabs (Metoprolol tartrate) .Marland Kitchen.. 1po two times a day 5)  Exforge 10-320 Mg Tabs (Amlodipine besylate-valsartan) .Marland Kitchen.. 1 by mouth once daily 6)  Januvia 100 Mg Tabs (Sitagliptin phosphate) .Marland Kitchen.. 1 by mouth once daily 7)  Ecotrin Low Strength 81 Mg Tbec (Aspirin) .Marland Kitchen.. 1po qd 8)  Zolpidem Tartrate 10 Mg Tabs (Zolpidem tartrate) .Marland Kitchen.. 1 by mouth at bedtime as needed 9)  Zoloft 50 Mg Tabs (Sertraline hcl) .Marland Kitchen.. 1 by mouth once daily - generic ok 10)  Estradiol 1 Mg Tabs (Estradiol) .Marland Kitchen.. 1 by mouth once daily 11)  Reglan 10 Mg Tabs (Metoclopramide hcl) .... Take 1 tab 30 min prior to meals 12)  Promethazine Hcl 25 Mg Tabs (Promethazine hcl) .... Take 1 tab ebery 6 hours as needed for nausea 13)  Iron 325 (65 Fe) Mg Tabs (Ferrous sulfate) .Marland Kitchen.. 1 by mouth qd 14)  Prilosec 40 Mg Cpdr (Omeprazole) .... One tablet by mouth once daily 15)  Oxycodone-acetaminophen 5-325 Mg Tabs (Oxycodone-acetaminophen) .... Take 1-2 tablets every 6 hours as needede 16)  Carafate 1 Gm/100ml Susp (Sucralfate) .... One gram three times a day before meals 17)  Hyoscyamine Sulfate 0.125 Mg Subl (Hyoscyamine sulfate) .Marland Kitchen.. 1-2 tablets by mouth under tongue every 4 hours as needed 18)  Levemir Flexpen 100 Unit/ml Soln (Insulin detemir) .Marland Kitchen.. 10 units subcutaneously once daily  Patient Instructions: 1)  You will be contacted about the referral(s) to: Dr Fuller Plan 2)  Please ask Dr Electa Sniff about the 80 mg simvastatin  - ? continue at that strength 3)  Continue all previous medications as before this visit  4)  Please schedule a follow-up appointment in 1 year or sooner if needed Prescriptions: PRILOSEC 40 MG CPDR (OMEPRAZOLE) one tablet by mouth once daily  #90 x 3   Entered and Authorized by:   Biagio Borg MD   Signed by:   Biagio Borg MD on 05/01/2010   Method used:   [facility]        [facility] [address] [phone]* (retail)       [address]       [place], [place]  [address]       Ph: [phone] or [phone]       Fax: [phone]   RxID:   [id number] ESTRADIOL 1 MG TABS (ESTRADIOL) 1 by mouth once daily  #90 x 3   Entered and Authorized by:   Biagio Borg MD   Signed by:   Biagio Borg MD on 05/01/2010   Method used:   [facility] to        [facility] [address] [phone]* (retail)       [place] [address]       [place], [place]  [address]       Ph: [phone] or [phone]       Fax: [phone]   RxID:   [id number] ZOLOFT 50 MG  TABS (SERTRALINE HCL) 1 by mouth once daily - generic ok  #90 x 3   Entered and Authorized by:   Biagio Borg MD   Signed by:   Biagio Borg MD on 05/01/2010   Method used:   [facility]ronically to        [facility] [address] [phone]* (retail)       [place] [address]       [place], [place]  [address]       Ph: [phone] or [phone]       Fax: [phone]   RxID:   [id number] ZOLPIDEM TARTRATE 10 MG  TABS (ZOLPIDEM TARTRATE) 1 by mouth at bedtime as needed  #30 x 5   Entered and Authorized by:   Biagio Borg MD   Signed by:   Hunt Oris  Sharona Rovner MD on 05/01/2010   Method used:   Print then Give to Patient   RxID:   [id number] JANUVIA 100 MG  TABS (SITAGLIPTIN PHOSPHATE) 1 by mouth once daily  #90 x 3   Entered and Authorized by:   Biagio Borg MD   Signed by:   Biagio Borg MD on 05/01/2010   Method used:   Electronically to        [facility] [address] [phone]* (retail)       [place] [address]       [place], [place]  [address]       Ph: [phone] or [phone]       Fax: [phone]   RxID:   [id number] EXFORGE 10-320 MG TABS (AMLODIPINE BESYLATE-VALSARTAN) 1 by mouth once daily  #90 x 3   Entered and Authorized by:   Biagio Borg MD   Signed by:   Biagio Borg MD on 05/01/2010   Method used:   Electronically to        [facility] [address] [phone]* (retail)       [place] [address]       [place], [place]  [address]       Ph: [phone] or [phone]       Fax: [phone]   RxID:   [id number] METOPROLOL TARTRATE 50 MG TABS (METOPROLOL TARTRATE) 1po two times a day  #180 x 3   Entered and Authorized by:   Biagio Borg MD   Signed by:   Biagio Borg MD on 05/01/2010   Method used:   Electronically to        [facility] [address] [phone]* (retail)       [place] [address]       [place], [place]  [address]       Ph: [phone] or [phone]       Fax: [phone]   RxID:   [id number] METOPROLOL TARTRATE 100 MG  TABS (METOPROLOL TARTRATE) 1/2  by mouth two times a day  #90 x 3   Entered and Authorized by:   Biagio Borg MD   Signed by:   Biagio Borg MD on 05/01/2010   Method used:   Electronically to        [facility] [address] [phone]* (retail)       [place] [address]       [place], [place]  [address]       Ph: [phone] or [phone]       Fax: [phone]   RxID:   [id number] ISOSORBIDE DINITRATE 10 MG  TABS (ISOSORBIDE DINITRATE) 1 by mouth two times a day  #180 x 3   Entered and Authorized by:   Biagio Borg MD   Signed by:   Biagio Borg MD on 05/01/2010   Method used:   Electronically to        [facility] [address] [phone]* (retail)       [place] [address]       [place], [place]  [address]       Ph: [phone] or [phone]       Fax: [phone]   RxID:   [id number] METFORMIN HCL 500 MG TB24 (METFORMIN HCL)  1 by mouth two times a day  #180 x 3   Entered and Authorized by:   Biagio Borg MD   Signed by:   Biagio Borg MD on 05/01/2010   Method used:   Electronically to        [facility] [address] [phone]* (retail)       [address]       [place], [place]  [address]       Ph: [phone] or [phone]       Fax: [phone]   RxID:   [id number] REGLAN 10 MG TABS (METOCLOPRAMIDE HCL) Take 1 tab 30 min prior to meals  #90 x 0   Entered and  Authorized by:   Biagio Borg MD   Signed by:   Biagio Borg MD on 05/01/2010   Method used:   Electronically to        [facility] [address] [phone]* (retail)       [place] [address]       [place], [place]  [address]       Ph: [phone] or [phone]       Fax: [phone]   RxID:   [id number]

## 2010-11-06 NOTE — Assessment & Plan Note (Signed)
Summary: flu shot/Wahid Holley/8:45 appt/denae/cd   Nurse Visit   Vital Signs:  Patient Profile:   60 Years Old Female Temp:     97.3 degrees F oral  Vitals Entered By: Hector Brunswick (July 14, 2008 8:52 AM)                 Prior Medications: METFORMIN HCL 500 MG TB24 (METFORMIN HCL) 1 by mouth bid LIPITOR 80 MG  TABS (ATORVASTATIN CALCIUM) 1 by mouth qd VESICARE 5 MG  TABS (SOLIFENACIN SUCCINATE) 1 by mouth qd ISOSORBIDE DINITRATE 10 MG  TABS (ISOSORBIDE DINITRATE) 1 by mouth bid METOPROLOL TARTRATE 100 MG  TABS (METOPROLOL TARTRATE) 1 by mouth bid CLONIDINE HCL 0.1 MG  TABS (CLONIDINE HCL) 1 by mouth bid EXFORGE 10-320 MG TABS (AMLODIPINE BESYLATE-VALSARTAN) 1 by mouth once daily JANUVIA 100 MG  TABS (SITAGLIPTIN PHOSPHATE) 1 by mouth qd VICODIN HP 10-660 MG  TABS (HYDROCODONE-ACETAMINOPHEN) 1 by mouth qid prn ECOTRIN LOW STRENGTH 81 MG  TBEC (ASPIRIN) 1po qd FIORICET 50-325-40 MG  TABS (BUTALBITAL-APAP-CAFFEINE) 1 by mouth qd ZOLPIDEM TARTRATE 10 MG  TABS (ZOLPIDEM TARTRATE) 1 by mouth at bedtime prn FLEXERIL 5 MG  TABS (CYCLOBENZAPRINE HCL) 1po three times a day prn CELEBREX 200 MG  CAPS (CELECOXIB) 1 by mouth qd ZOLOFT 50 MG  TABS (SERTRALINE HCL) 1 by mouth qd NOVOLOG FLEXPEN 100 UNIT/ML  SOLN (INSULIN ASPART) use asd qid sliding scale NOVOLOG FLEXPEN NEEDLE () use asd qid with sliding scale PROMETHAZINE HCL 25 MG  TABS (PROMETHAZINE HCL) 1 by mouth q 6 hrs prn ESTRADIOL 0.1 MG/24HR PTWK (ESTRADIOL) use asd 1 patch q wk Current Allergies: ! PCN    Orders Added: 1)  Admin 1st Vaccine [id number] 2)  Flu Vaccine 93yrs + [id number]   Flu Vaccine Consent Questions     Do you have a history of severe allergic reactions to this vaccine? no    Any prior history of allergic reactions to egg and/or gelatin? no    Do you have a sensitivity to the preservative Thimersol? no    Do you have a past history of Guillan-Barre Syndrome? no    Do you currently have an acute febrile illness?  no    Have you ever had a severe reaction to latex? no    Vaccine information given and explained to patient? yes    Are you currently pregnant? no    Lot Number:[id number]   Site Given  Left Deltoid IM] .opcflu

## 2010-11-06 NOTE — Consult Note (Signed)
Summary: Consultation Note/[facility]  Consultation Note/[facility]   Imported By: Adonis Huguenin 04/26/2008 15:16:22  _____________________________________________________________________  External Attachment:    Type:   Image     Comment:   External Document

## 2010-11-06 NOTE — Assessment & Plan Note (Signed)
Summary: f/u on labs from last wk./eas   Vital Signs:  Patient Profile:   60 Years Old Female Weight:      276 pounds Temp:     97.6 degrees F oral BP sitting:   152 / 86  (left arm)                 Chief Complaint:  f/u.  History of Present Illness: no further dark stools, but has ongoing pain to left and mid abdomen despite tx for prob UTI with cipro; recent labs with Hgb aprox 9, not taking the lipitor lately, never got med for sleep last visit  Current Allergies (reviewed today): ! PCN Updated/Current Medications (including changes made in today's visit):  FLUOXETINE HCL 40 MG  CAPS (FLUOXETINE HCL) 1 by mouth qd FUROSEMIDE 80 MG  TABS (FUROSEMIDE) 1 by mouth qd PREMARIN 1.25 MG TABS (ESTROGENS CONJUGATED) 1 by mouth qd METFORMIN HCL 500 MG TB24 (METFORMIN HCL) 1 by mouth bid LIPITOR 80 MG  TABS (ATORVASTATIN CALCIUM) 1 by mouth qd CLIMARA 0.05 MG/24HR  PTWK (ESTRADIOL) 1 patch q wk VESICARE 5 MG  TABS (SOLIFENACIN SUCCINATE) 1 by mouth qd IMDUR 30 MG  TB24 (ISOSORBIDE MONONITRATE) 1 by mouth qd METOPROLOL TARTRATE 100 MG  TABS (METOPROLOL TARTRATE) 1 by mouth bid CLONIDINE HCL 0.1 MG  TABS (CLONIDINE HCL) 1 by mouth bid AZOR 10-40 MG  TABS (AMLODIPINE-OLMESARTAN) 1 by mouth qd JANUVIA 100 MG  TABS (SITAGLIPTIN PHOSPHATE) 1 by mouth qd VICODIN HP 10-660 MG  TABS (HYDROCODONE-ACETAMINOPHEN) 1 by mouth qid prn ECOTRIN LOW STRENGTH 81 MG  TBEC (ASPIRIN) 1po qd FIORICET 50-325-40 MG  TABS (BUTALBITAL-APAP-CAFFEINE) 1 by mouth qd ZOLPIDEM TARTRATE 10 MG  TABS (ZOLPIDEM TARTRATE) 1 by mouth at bedtime prn CIPROFLOXACIN HCL 500 MG  TABS (CIPROFLOXACIN HCL) 1 by mouth bid   Past Medical History:    Reviewed history from 09/01/2007 and no changes required:       Anxiety       Coronary artery disease       Diabetes mellitus, type II       Hyperlipidemia       Hypertension       morbid obesity       migraine       OAB       Anemia-NOS       hx of pos PPD  Depression  Past Surgical History:    Reviewed history from 09/01/2007 and no changes required:       s/p left knee replacement       s/p right knee replacement   Family History:    Reviewed history from 08/13/2007 and no changes required:       Family History of CAD Female 1st degree relative       Family History Diabetes 1st degree relative       Family History Hypertension  Social History:    Reviewed history from 08/13/2007 and no changes required:       Former Smoker       Alcohol use-no     Physical Exam  General:     mild ill appearing Head:     Normocephalic and atraumatic without obvious abnormalities. No apparent alopecia or balding. Eyes:     No corneal or conjunctival inflammation noted. EOMI. Perrla. Ears:     External ear exam shows no significant lesions or deformities.  Otoscopic examination reveals clear canals, tympanic membranes are intact bilaterally without bulging, retraction, inflammation  or discharge. Hearing is grossly normal bilaterally. Nose:     External nasal examination shows no deformity or inflammation. Nasal mucosa are pink and moist without lesions or exudates. Mouth:     Oral mucosa and oropharynx without lesions or exudates.  Teeth in good repair. Neck:     No deformities, masses, or tenderness noted. Lungs:     Normal respiratory effort, chest expands symmetrically. Lungs are clear to auscultation, no crackles or wheezes. Heart:     Normal rate and regular rhythm. S1 and S2 normal without gallop, murmur, click, rub or other extra sounds. Abdomen:     mild mid to left abd tender Extremities:     no edema    Impression & Recommendations:  Problem # 1:  ANEMIA-NOS (ICD-285.9) check f/u labs  Orders: TLB-CBC Platelet - w/Differential (85025-CBCD) TLB-B12, Serum-Total ONLY (82607-B12) TLB-Iron, (Fe) Total (83540-FE) TLB-Transferrin (84466-TRNSF)   Problem # 2:  ABDOMINAL PAIN, LEFT LOWER QUADRANT (ICD-789.04) no real  improvement on cipro - tx w/ pain meds, check ct to r/o renal stone, etc  Orders: Radiology Referral (Radiology)   Problem # 3:  INSOMNIA-SLEEP DISORDER-UNSPEC (ICD-307.40) did not get her zolpidem rx last visit - will do today  Complete Medication List: 1)  Fluoxetine Hcl 40 Mg Caps (Fluoxetine hcl) .Marland Kitchen.. 1 by mouth qd 2)  Furosemide 80 Mg Tabs (Furosemide) .Marland Kitchen.. 1 by mouth qd 3)  Premarin 1.25 Mg Tabs (Estrogens conjugated) .Marland Kitchen.. 1 by mouth qd 4)  Metformin Hcl 500 Mg Tb24 (Metformin hcl) .Marland Kitchen.. 1 by mouth bid 5)  Lipitor 80 Mg Tabs (Atorvastatin calcium) .Marland Kitchen.. 1 by mouth qd 6)  Climara 0.05 Mg/24hr Ptwk (Estradiol) .Marland Kitchen.. 1 patch q wk 7)  Vesicare 5 Mg Tabs (Solifenacin succinate) .Marland Kitchen.. 1 by mouth qd 8)  Imdur 30 Mg Tb24 (Isosorbide mononitrate) .Marland Kitchen.. 1 by mouth qd 9)  Metoprolol Tartrate 100 Mg Tabs (Metoprolol tartrate) .Marland Kitchen.. 1 by mouth bid 10)  Clonidine Hcl 0.1 Mg Tabs (Clonidine hcl) .Marland Kitchen.. 1 by mouth bid 11)  Azor 10-40 Mg Tabs (Amlodipine-olmesartan) .Marland Kitchen.. 1 by mouth qd 12)  Januvia 100 Mg Tabs (Sitagliptin phosphate) .Marland Kitchen.. 1 by mouth qd 13)  Vicodin Hp 10-660 Mg Tabs (Hydrocodone-acetaminophen) .Marland Kitchen.. 1 by mouth qid prn 14)  Ecotrin Low Strength 81 Mg Tbec (Aspirin) .Marland Kitchen.. 1po qd 15)  Fioricet 50-325-40 Mg Tabs (Butalbital-apap-caffeine) .Marland Kitchen.. 1 by mouth qd 16)  Zolpidem Tartrate 10 Mg Tabs (Zolpidem tartrate) .Marland Kitchen.. 1 by mouth at bedtime prn 17)  Ciprofloxacin Hcl 500 Mg Tabs (Ciprofloxacin hcl) .Marland Kitchen.. 1 by mouth bid   Patient Instructions: 1)  you will have blood work today 2)  You will be scheduled for CT scan today 3)  you are given pain medication and sleeping medication today 4)  Please schedule a follow-up appointment as needed.    Prescriptions: LIPITOR 80 MG  TABS (ATORVASTATIN CALCIUM) 1 by mouth qd  #90 x 3   Entered and Authorized by:   Biagio Borg MD   Signed by:   Biagio Borg MD on 09/07/2007   Method used:   Print then Give to Patient   RxID:   [id number] LIPITOR  80 MG  TABS (ATORVASTATIN CALCIUM) 1 by mouth qd  #30 x 11   Entered and Authorized by:   Biagio Borg MD   Signed by:   Biagio Borg MD on 09/07/2007   Method used:   Electronically sent to ...       [facility]  [place], [place]  [address]       Ph: [phone]       Fax: [phone]   RxID:   [id number] VICODIN HP 10-660 MG  TABS (HYDROCODONE-ACETAMINOPHEN) 1 by mouth qid prn  #120 x 2   Entered and Authorized by:   Biagio Borg MD   Signed by:   Biagio Borg MD on 09/07/2007   Method used:   Print then Give to Patient   RxID:   [id number] ZOLPIDEM TARTRATE 10 MG  TABS (ZOLPIDEM TARTRATE) 1 by mouth at bedtime prn  #90 x 1   Entered and Authorized by:   Biagio Borg MD   Signed by:   Biagio Borg MD on 09/07/2007   Method used:   Print then Give to Patient   [id number]  ]

## 2010-11-06 NOTE — Procedures (Signed)
Summary: Endoscopy   EGD  Procedure date:  04/15/2008  Findings:      Location: [facility]    Patient Name: Sherry, Dyer MRN:  Procedure Procedures: Panendoscopy (EGD) CPT: A5739879.  Personnel: Endoscopist: Pricilla Riffle. Fuller Plan, MD, Marval Regal.  Exam Location: Exam performed in Endoscopy Suite. Outpatient  Patient Consent: Procedure, Alternatives, Risks and Benefits discussed, consent obtained, from patient. Consent was obtained by the RN.  Indications Symptoms: Early satiety. Abdominal pain, location: LUQ. Reflux symptoms Melena. Last bleeding episode >48 hours ago, documentation: patient report.  History  Current Medications: Patient is not currently taking Coumadin.  Pre-Exam Physical: Performed Apr 15, 2008  Cardio-pulmonary exam, HEENT exam, Abdominal exam, Mental status exam WNL.  Comments: Pt. history reviewed/updated, physical exam performed prior to initiation of sedation?Yes Exam Exam Info: Maximum depth of insertion Duodenum, intended Duodenum. Vocal cords not visualized. Gastric retroflexion performed. Images taken. ASA Classification: II. Tolerance: excellent.  Sedation Meds: Patient assessed and found to be appropriate for moderate (conscious) sedation. Fentanyl 75 mcg. given IV. Versed 6 mg. given IV. Cetacaine Spray 2 sprays given aerosolized.  Monitoring: BP and pulse monitoring done. Oximetry used. Supplemental O2 given  Findings Normal: Proximal Esophagus to Mid Esophagus.  - FOREIGN BODY / RETAINED FOOD: Retained food, found in Body. Not Removed. Comments: Large volume of retained solids.  ESOPHAGEAL INFLAMMATION: Severity is severe, ulcerations present.  [place] Classification: Grade B. ICD9: Esophagitis, Reflux: 530. 11.  HIATAL HERNIA: Regular, 3 cms. in length. ICD9: Hernia, Hiatal: 553.3. ULCER: in Pyloric Sphincter Maximum size: 5 mm. Not bleeding, clear ulcer base. The ulcer was washed with water. An image was taken. ICD9:  Ulcer, Gastric, Acute without Hemorrhage: 531.30.  OTHER FINDING: deformity in Antrum. Comments: post op deformity and narrowing but not obstructing.  ULCER: in Antrum Maximum size: 10 mm. Not bleeding, clear ulcer base. The ulcer was washed with water. ICD9: Ulcer, Gastric, Acute without Hemorrhage: 531.30. Comment: superficial ulceration at surgical site.  Normal: Duodenal Bulb to Duodenal 2nd Portion.   Assessment  Diagnoses: 530.11: Esophagitis, Reflux.  553.3: Hernia, Hiatal.  531.30: Ulcer, Gastric, Acute without Hemorrhage.   Comments: Retained gastric solids Antral deformity at surgical site-nonobstructing Events  Unplanned Intervention: No unplanned interventions were required.  Unplanned Events: There were no complications. Plans Medication(s): PPI: BID, for indefinitely.  Promotility: Metaclopramide 10 mg ac, for 6 wks.   Patient Education: Patient given standard instructions for: Ulcer. Hiatal Hernia. Reflux. LOW RESIDUE, LOW FAT DIET.  Comments: AVOID ALL ASA/NSAIDS.  Disposition: After procedure patient sent to recovery. After recovery patient sent home.  Scheduling: Office Visit, to [employer]. Fuller Plan, MD, [facility], around May 23, 2008.  Blood Tests, H. pylori Ab Apr 15, 2008.      cc: Isabel Caprice. Hassell Done, MD   This report was created from the original endoscopy report, which was reviewed and signed by the above listed endoscopist.

## 2010-11-06 NOTE — Progress Notes (Signed)
  Phone Note Refill Request  on Feb 28, 2010 9:07 AM  Refills Requested: Medication #1:  ISOSORBIDE DINITRATE 10 MG  TABS 1 by mouth two times a day   Dosage confirmed as above?Dosage Confirmed   Last Refilled: 02/2009   Notes: [facility] Initial call taken by: Sharon Seller,  Feb 28, 2010 9:07 AM    Prescriptions: ISOSORBIDE DINITRATE 10 MG  TABS (ISOSORBIDE DINITRATE) 1 by mouth two times a day  #60 x 0   Entered by:   Sharon Seller   Authorized by:   Biagio Borg MD   Signed by:   Sharon Seller on 02/28/2010   Method used:   [facility]ed to ...       [facility] [address] [phone]* (retail)       [address]       [place], [place]  [address]       Ph: [phone] or [phone]       Fax: [phone]   RxID:   [id number]

## 2010-11-06 NOTE — Consult Note (Signed)
Summary: [facility]   Imported By: Adonis Huguenin 03/23/2008 10:29:43  _____________________________________________________________________  External Attachment:    Type:   Image     Comment:   External Document

## 2010-11-06 NOTE — Procedures (Signed)
Summary: Colonoscopy Report/LEB  Colonoscopy Report/LEB   Imported By: Jerrye Noble D'jimraou 09/28/2007 12:11:01  _____________________________________________________________________  External Attachment:    Type:   Image     Comment:   External Document

## 2010-11-06 NOTE — Assessment & Plan Note (Signed)
Summary: black tarry stools/sheri   History of Present Illness Visit Type: Follow-up Visit Primary GI MD: Joylene Igo MD [facility] Primary Provider: Cathlean Cower, MD Chief Complaint: Patient c/o of dark black stools since last Thursday along with lower abdominal pain. She states that she is somewhat nauseated but has not had any vomiting.  History of Present Illness:   [name] returns today complaining of lower abdominal and periumbilical abdominal pain for the past week. She underwent cholecystectomy by Dr. Johnathan Hausen in September. She seemed to have a very good recovery until approximately one week ago when she also noted dark black stools, nausea, anorexia, GERD symptoms and lower abdominal pain. She was seen in the emergency room on 12/7 and gave a history of taking Pepto-Bismol. Stool was Hemoccult negative. CBC was unremarkable except for a hemoglobin of 11.4. Basic metabolic panel and urinalysis were unremarkable. A CT scan of the abdomen and pelvis did not show any acute abnormalities to the abdomen or pelvis. She was treated with Cipro for possible urinary tract infection. She continues to have lower abdominal pain, which he is exacerbated by meals and associated with nausea. She states she has a bowel movement about every other day, which is her normal bowel habit. Last upper endoscopy in June 2010 with retained gastric solids. Last colonoscopy December 2008 with diverticulosis and hemorrhoids.    GI Review of Systems    Reports abdominal pain, acid reflux, belching, loss of appetite, and  nausea.     Location of  Abdominal pain: lower abdomen.    Denies bloating, chest pain, dysphagia with liquids, dysphagia with solids, heartburn, vomiting, vomiting blood, weight loss, and  weight gain.      Reports black tarry stools.     Denies anal fissure, change in bowel habit, constipation, diarrhea, diverticulosis, fecal incontinence, heme positive stool, hemorrhoids, irritable bowel  syndrome, jaundice, light color stool, liver problems, rectal bleeding, and  rectal pain.   Current Medications (verified): 1)  Metformin Hcl 500 Mg Tb24 (Metformin Hcl) .Marland Kitchen.. 1 By Mouth Two Times A Day 2)  Lipitor 80 Mg  Tabs (Atorvastatin Calcium) .Marland Kitchen.. 1 By Mouth Once Daily 3)  Vesicare 5 Mg  Tabs (Solifenacin Succinate) .Marland Kitchen.. 1 By Mouth Qd 4)  Isosorbide Dinitrate 10 Mg  Tabs (Isosorbide Dinitrate) .Marland Kitchen.. 1 By Mouth Two Times A Day 5)  Metoprolol Tartrate 100 Mg  Tabs (Metoprolol Tartrate) .... 1/2  By Mouth Two Times A Day 6)  Exforge 10-320 Mg Tabs (Amlodipine Besylate-Valsartan) .Marland Kitchen.. 1 By Mouth Once Daily 7)  Januvia 100 Mg  Tabs (Sitagliptin Phosphate) .Marland Kitchen.. 1 By Mouth Once Daily 8)  Ecotrin Low Strength 81 Mg  Tbec (Aspirin) .Marland Kitchen.. 1po Qd 9)  Fioricet 50-325-40 Mg  Tabs (Butalbital-Apap-Caffeine) .Marland Kitchen.. 1 By Mouth Once Daily As Needed 10)  Zolpidem Tartrate 10 Mg  Tabs (Zolpidem Tartrate) .Marland Kitchen.. 1 By Mouth At Bedtime As Needed 11)  Zoloft 50 Mg  Tabs (Sertraline Hcl) .Marland Kitchen.. 1 By Mouth Once Daily - Generic Ok 12)  Estradiol 1 Mg Tabs (Estradiol) .Marland Kitchen.. 1 By Mouth Once Daily 13)  Reglan 10 Mg Tabs (Metoclopramide Hcl) .... Take 1 Tab 30 Min Prior To Meals 14)  Promethazine Hcl 25 Mg Tabs (Promethazine Hcl) .... Take 1 Tab Ebery 6 Hours As Needed For Nausea 15)  Darvocet-N 100 100-650 Mg Tabs (Propoxyphene N-Apap) .... Take 1 Tab Every 4 Hours As Needed For Pain 16)  Iron 325 (65 Fe) Mg Tabs (Ferrous Sulfate) .Marland Kitchen.. 1 By Mouth  Qd 17)  Prilosec 40 Mg Cpdr (Omeprazole) .... One Tablet By Mouth Once Daily 18)  Oxycodone-Acetaminophen 5-325 Mg Tabs (Oxycodone-Acetaminophen) .... Take 1-2 Tablets Every 6 Hours As Needede 19)  Cipro 500 Mg Tabs (Ciprofloxacin Hcl) .... Take 1 Tablet By Mouth Two Times A Day  Allergies (verified): 1)  ! Pcn 2)  ! 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 Family History of CAD Female 1st degree relative Family History Diabetes 1st degree relative: Brother x3, Sister x 3 Family History Hypertension No FH of Colon Cancer: Family History of Heart Disease: Brother x 2, Sister x 2  Social History: Reviewed history from 03/15/2009 and no changes required. Former Smoker-stopped 30 years ago Alcohol use-no Married Occupation: Disabled Illicit Drug Use - no Patient gets regular exercise.  Review of Systems       The patient complains of arthritis/joint pain, back pain, and fatigue.         The pertinent positives and negatives are noted as above and in the HPI. All other ROS were reviewed and were negative.   Vital Signs:  Patient profile:   60 year old female Height:      66 inches Weight:      266.38 pounds BMI:     43.15 BSA:     2.26 Pulse rate:   88 / minute Pulse rhythm:   regular BP sitting:   154 / 90  (left arm) Cuff size:   large  Vitals Entered By: Awilda Bill CMA [name]) (September 13, 2009 3:21 PM)  Physical Exam  General:  Well developed, well nourished, no acute distress. obese.   Head:  Normocephalic and atraumatic. Eyes:  PERRLA, no icterus. Ears:  Normal auditory acuity. Mouth:  No deformity or lesions, dentition normal. Neck:  Supple; no masses or thyromegaly. Lungs:  Clear throughout to auscultation. Heart:  Regular rate and rhythm; no murmurs, rubs,  or  bruits. Abdomen:  Soft and nondistended. No masses, hepatosplenomegaly or hernias noted. Normal bowel sounds. Mild lower abdominal tenderness without rebound or guarding. Rectal:  Normal exam with Hemoccult-negative stool in the emergency room yesterday. Not repeated. Pulses:  Normal pulses noted. Extremities:  No clubbing, cyanosis, edema or deformities noted. Neurologic:  Alert and  oriented x4;  grossly normal neurologically. Cervical Nodes:  No significant cervical adenopathy. Inguinal Nodes:  No significant inguinal adenopathy. Psych:  Alert and cooperative. Depressed affect.     Impression & Recommendations:  Problem # 1:  ABDOMINAL PAIN OTHER SPECIFIED SITE (ICD-789.09) Lower abdominal pain without clear etiology on CT scan. Rule out small bowel disease or early partial small bowel obstruction. Trial of Carafate t.i.d., p.r.n., and trial of Levsin sublingually q.4 hours p.r.n. If no etiology of her abdominal complaints as found in her symptoms do not respond, consider referral to a tertiary center for further evaluation. Avoid Pepto-Bismol. Orders: Small Bowel Follow Through (Sm Bowel FT)  Problem # 2:  GERD (ICD-530.81) Continue Prilosec 40 mg  daily.  Problem # 3:  GASTROPARESIS (ICD-536.3) Continue Reglan 10 mg a.c. for now.  Patient Instructions: 1)  You have been scheduled for a Small bowel follow through on 09/18/09 at 8:00 arriving at 7:45am at [facility]. Nothing by mouth after midnight.  If you need to reschedule please call [phone]. 2)  Pick up your prescriptions at your pharmacy.  3)  Please schedule a follow-up appointment in 4 to 6 weeks.  4)  Copy sent to : Cathlean Cower, MD 5)                       Johnathan Hausen, MD S. 6)  The medication list was reviewed and reconciled.  All changed / newly prescribed medications were explained.  A complete medication list was provided to the patient / caregiver.  Prescriptions: HYOSCYAMINE SULFATE 0.125 MG SUBL  (HYOSCYAMINE SULFATE) 1-2 tablets by mouth under tongue every 4 hours as needed  #100 x 11   Entered by:   Marlon Pel CMA ([place])   Authorized by:   Ladene Artist MD [facility]   Signed by:   Marlon Pel CMA ([place]) on 09/13/2009   Method used:   Electronically to        [facility] [address] [phone]* (retail)       [address]       [place], [place]  [address]       Ph: [phone] or [phone]       Fax: [phone]   RxID:   [id number] CARAFATE 1 GM/10ML SUSP (SUCRALFATE) one gram three times a day before meals  #90 x 5   Entered by:   Marlon Pel CMA ([place])   Authorized by:   Ladene Artist MD [facility]   Signed by:   Marlon Pel CMA ([place]) on 09/13/2009   Method used:   Electronically to        [facility] [address] [phone]* (retail)       [address]       [place],   [address]       Ph: [phone] or [phone]       Fax: [phone]   RxID:   [id number]

## 2010-11-06 NOTE — Miscellaneous (Signed)
Summary: Orders Update  Clinical Lists Changes  Orders: Added new Test order of TLB-B12, Serum-Total ONLY [id number]) - Signed Added new Test order of TLB-Folic Acid (Folate) ([id number]) - Signed Added new Test order of TLB-Ferritin (82728-FER) - Signed Added new Test order of TLB-IBC Pnl (Iron/FE;Transferrin) (83550-IBC) - Signed

## 2010-11-06 NOTE — Op Note (Signed)
Summary: Laparoscopic Cholecystectomy  NAME:  [name], [name]               ACCOUNT NO.:  [id number]      MEDICAL RECORD NO.:  [id number]          PATIENT TYPE:  OIB      LOCATION:  5123                         FACILITY:  [facility]      PHYSICIAN:  Isabel Caprice. Hassell Done, MD  DATE OF BIRTH:  March 23, 1951      DATE OF PROCEDURE:  06/14/2009   DATE OF DISCHARGE:                                  OPERATIVE REPORT      PREOPERATIVE DIAGNOSES:  Gallstones and diabetes mellitus.      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      PROCEDURE:  Laparoscopic cholecystectomy with intraoperative   cholangiogram.      SURGEON:  Isabel Caprice. Hassell Done, MD      ASSISTANT:  Ascencion Dike, PA-C      ANESTHESIA:  General endotracheal.      DESCRIPTION OF PROCEDURE:  The patient was taken to room 17 on   Wednesday, June 14, 2009, given general anesthesia.  The abdomen was   prepped with Techni-Care equivalent and draped sterilely.  Access to the   abdomen was achieved through the right upper quadrant using a 0-degree 5   mm Optiview technique without difficulty.  The abdomen was insufflated   and a 5-mm placed just below the umbilicus.  A harmonic scalpel was used   to take down some of the adhesions to her midline incision and cleared   this away up to the level of her falciform in the liver.  A 2 trials   were placed, one on the right and second one down below, and then a   10:11 was placed in the upper midline and very obliquely across the scar   and through the falciform to the right upper quadrant.  Gallbladder was   adherent to surrounding omentum, which I took down with a harmonic   scalpel freeing up the fundus allowing me to elevate it.  The Calot's   triangle was exposed by downward outward traction on the infundibulum to   give me the exposure of Calot's triangle.  I incised the peritoneal   reflection overlying this, and was  then able to dissect out the cystic   duct and cystic artery, which were room on top of each other.   Ultimately, I double clipped the cystic artery, which was small and used   the Harmonic on that.  I incised the cystic duct after placing a clip   upon the gallbladder, I inserted a Reddick catheter.  A dynamic   cholangiogram revealed a modest amount of cystic duct remnant and free   flow into the common bile duct with retrograde filling of the   intrahepatic radicals and distally there was some tapering in the   pancreas where the common duct ran through the pancreas.  Free flow in   the duodenum was noted.  No stones were seen.  The cystic duct was then   triple clipped and then divided.  The gallbladder was then removed  and   the gallbladder bed was hooked with electrocautery, staying on the   gallbladder and without entering it.  The gallbladder bed was bovied at   the end of the case to prevent bleeding, and no oozing or bile leaks   were noted.  The gallbladder was then placed in a bag and brought out   through the upper port.  It contained many black anthracotic pigment   stones, which were small caviar looking like stones.  Reinspecting the   gallbladder bed, everything appeared to be in order.  I looked around at   the stomach side.  To the left side of the falciform, she did   have a moderate amount of adhesions, which might that more of a   challenge, although the left upper quadrant seemed to the open.  Wounds   were injected with Marcaine and closed 4-0 Vicryl and Benzoin Steri-   Strips.  The patient seemed to tolerate the procedure well and was taken   to recovery room in satisfactory condition.               Isabel Caprice Hassell Done, MD   Electronically Signed              SP-Surgical Pathology - STATUS: Final  .                                         Perform Date: 8 Sep10 00:01  Ordered By: Alejandro Mulling        Ordered Date: 8 Sep10 15:46  Facility:  [facility]                              Department: [facility]  Service Report Text  [address]   [place], [place] [address]   [phone]    REPORT OF SURGICAL PATHOLOGY    Case #: [id number]   Patient Name: [name], [name].   Office Chart Number: N/A    MRN: [id number]   Pathologist: Chrystie Nose. Saralyn Pilar, MD   DOB/Age [birth year] (Age: 60) Gender: F   Date Taken: 06/14/2009   Date Received: 06/14/2009    FINAL DIAGNOSIS    ***MICROSCOPIC EXAMINATION AND DIAGNOSIS***    GALLBLADDER: CHOLELITHIASIS    kv   Date Reported: 06/15/2009 Chrystie Nose. Saralyn Pilar, MD   *** Electronically Signed Out By JDP ***    Clinical information   Gallstones (las)    specimen(s) obtained   Gallbladder    Gross Description   Size/?Intact: Partially opened, 6 x 5 cm in length x 2.5 cm in   width, received in formalin   Serosal surface: Wrinkled and green   Mucosa/Wall: Mucosa is slightly granular and tan green. Wall: up   to 0.1 cm thick   Contents: Green, sludge-like material as well as several black   to green irregular choleliths, up to 0.3 cm in greatest   dimension.   Cystic duct: Patent, 0.2 cm in diameter lumen   Block Summary: Two representative sections are submitted in one   block. (SP;jes 06/14/09)    jes/    GB-Gallbladder   Size/?Intact: Partially opened, 6 x 5 cm in length x 2.5 cm in   width, received in formalin   Serosal surface: Wrinkled and green   Mucosa/Wall: Mucosa is slightly granular and tan green.  Wall: up   to 0.1 cm thick   Contents: Green, sludge-like material as well as several black   to green irregular choleliths, up to 0.3 cm in greatest   dimension.   Cystic duct: Patent, 0.2 cm in diameter lumen   Block Summary: Two representative sections are submitted in one   block. (SP;jes 06/14/09)   GALLBLADDER: CHOLELITHIASIS  Additional Information  HL7 RESULT STATUS : F  External IF Update Timestamp : 2009-06-14:15:46:00.000000            MBM/MEDQ  D:  06/14/2009   T:  06/15/2009  Job:  [id number]

## 2010-11-06 NOTE — Medication Information (Signed)
Summary: RX Folder  RX Folder   Imported By: Laural Benes 08/13/2007 14:12:56  _____________________________________________________________________  External Attachment:    Type:   Image     Comment:   External Document

## 2010-11-06 NOTE — Letter (Signed)
Summary: [facility]   Imported By: Phillis Knack 07/18/2010 10:50:59  _____________________________________________________________________  External Attachment:    Type:   Image     Comment:   External Document

## 2010-11-06 NOTE — Progress Notes (Signed)
Summary: triage   Phone Note Call from Patient Call back at Home Phone [phone]   Caller: Patient Call For: Dr. Fuller Plan Reason for Call: Talk to Nurse Summary of Call: pt reporting black tarry stools and would like to be seen asap Initial call taken by: Lucien Mons,  September 11, 2009 3:47 PM  Follow-up for Phone Call        Left message for patient to call back [place], [facility]  September 11, 2009 3:53 PM  Patient is scheduled for Dr Fuller Plan 09-13-09 3:00.  She did go to the ER after speaking to Dr Gwynn Burly office.  She was seen for black tarry stools.   Follow-up by: Barb Merino RN, [place],  September 12, 2009 9:55 AM

## 2010-11-06 NOTE — Letter (Signed)
Summary: [facility]   Imported By: Edmonia  12/26/2008 13:13:43  _____________________________________________________________________  External Attachment:    Type:   Image     Comment:   External Document

## 2010-11-06 NOTE — Assessment & Plan Note (Signed)
Summary: Work-in for epigastric pain,LLQ abd pain,belching    History of Present Illness Visit Type: Follow-up Visit Primary GI MD: Joylene Igo MD [facility] Primary Provider: Cathlean Cower, MD Chief Complaint: Patient c/o 2 weeks intermittent epigastric and LLQ pain which is sharp and occurs after eating.  Patient also complains of excessive belching, heartburn and "sour taste" in mouth.  She states that she has also been having some nausea, vomiting and loss of appetite. Patient did have several episodes where she noted dark black stools, but this has since subsided. History of Present Illness:   60 YO FEMALE KNOWN TO DR Fuller Plan WITH HX OF A GASTRIC LIPMA S/P WEDGE RESECTION 2/09.SHE DEVELOPED ABDOMINAL PAIN ABD MELENA 7/09 AND UNDERWENT EGD  WHICH SHOWED SEVER ULCERATIV E ESOPHAGITIS,RETAINED FOOD IN [facility] AND AN ANTRAL ULCER AS WELL AS A PYLORIC ULCER,ALSO NOTED SOM E DEFORMITY OF STOMACH BUT NO OBSTRUCTION..SHE WAS TREATED WITH REGLAN,BID NEXIUM AND DID IMPROVE NOW WITH RECURRENT SXS OVER THE PAST MONTH OR SO,WORSE PAST 2 WEEKS WITH CONSTANT STOMACH PAIN,NAUSEA,OCCASIONAL VOMITING. PAIN EXACERBATED BY EATING AND HAS NOT BEEN ABLE TO EAT MUCH. WT DOWN FEW POUNDS. SHE HAS SEEN SOME INTERMITTENT DARK STOOLS.SHE WENT BACK ON NEXIUM TWICE DAILY AND HAS RESTARTED THE REGLAN  AS WELL . ON ONE BABY ASA once daily,NO NSAIDS.   GI Review of Systems    Reports abdominal pain, acid reflux, belching, heartburn, loss of appetite, nausea, vomiting, and  weight loss.      Denies bloating, chest pain, dysphagia with liquids, dysphagia with solids, and  vomiting blood.      Reports black tarry stools.     Denies anal fissure, constipation, diarrhea, diverticulosis, fecal incontinence, heme positive stool, hemorrhoids, irritable bowel syndrome, jaundice, light color stool, liver problems, rectal bleeding, and  rectal pain. Preventive Screening-Counseling & Management  Caffeine-Diet-Exercise     Does Patient  Exercise: yes      Drug Use:  no.      Current Medications (verified): 1)  Metformin Hcl 500 Mg Tb24 (Metformin Hcl) .Marland Kitchen.. 1 By Mouth Two Times A Day 2)  Lipitor 80 Mg  Tabs (Atorvastatin Calcium) .Marland Kitchen.. 1 By Mouth Once Daily 3)  Vesicare 5 Mg  Tabs (Solifenacin Succinate) .Marland Kitchen.. 1 By Mouth Qd 4)  Isosorbide Dinitrate 10 Mg  Tabs (Isosorbide Dinitrate) .Marland Kitchen.. 1 By Mouth Two Times A Day 5)  Metoprolol Tartrate 100 Mg  Tabs (Metoprolol Tartrate) .... 1/2  By Mouth Two Times A Day 6)  Exforge 10-320 Mg Tabs (Amlodipine Besylate-Valsartan) .Marland Kitchen.. 1 By Mouth Once Daily 7)  Januvia 100 Mg  Tabs (Sitagliptin Phosphate) .Marland Kitchen.. 1 By Mouth Once Daily 8)  Ecotrin Low Strength 81 Mg  Tbec (Aspirin) .Marland Kitchen.. 1po Qd 9)  Fioricet 50-325-40 Mg  Tabs (Butalbital-Apap-Caffeine) .Marland Kitchen.. 1 By Mouth Once Daily As Needed 10)  Zolpidem Tartrate 10 Mg  Tabs (Zolpidem Tartrate) .Marland Kitchen.. 1 By Mouth At Bedtime As Needed 11)  Zoloft 50 Mg  Tabs (Sertraline Hcl) .Marland Kitchen.. 1 By Mouth Once Daily - Generic Ok 12)  Estradiol 1 Mg Tabs (Estradiol) .Marland Kitchen.. 1 By Mouth Once Daily  Allergies: 1)  ! Pcn 2)  ! 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 Esophagitis  Hemorrhoids Diverticulosis  Past Surgical History: s/p left knee  replacement s/p right knee replacement with revision 7/09 - dr graves 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 with laparotomy and gastrostomy s/p right rotater cuff repair - 01/2009  Family History: Family History of CAD Female 1st degree relative Family History Diabetes 1st degree relative: Brother x3, Sister x 3 Family History Hypertension No FH of Colon Cancer: Family History of Heart Disease: Brother x 2, Sister x  2  Social History: Former Smoker-stopped 30 years ago Alcohol use-no Married Occupation: Disabled Illicit Drug Use - no Patient gets regular exercise. Drug Use:  no Does Patient Exercise:  yes  Review of Systems       The patient complains of fatigue and urination - excessive.  The patient denies allergy/sinus, anemia, anxiety-new, arthritis/joint pain, back pain, blood in urine, breast changes/lumps, change in vision, confusion, cough, coughing up blood, depression-new, fainting, fever, headaches-new, hearing problems, heart murmur, muscle pains/cramps, night sweats, nosebleeds, pregnancy symptoms, shortness of breath, skin rash, sore throat, swelling of feet/legs, swollen lymph glands, and thirst - excessive.    Vital Signs:  Patient profile:   60 year old female Height:      66 inches Weight:      264 pounds BMI:     42.76 BSA:     2.25 Pulse rate:   76 / minute Pulse rhythm:   regular BP sitting:   132 / 84  (left arm) Cuff size:   large  Vitals Entered By: Awilda Bill CMA (March 15, 2009 8:40 AM)  Physical Exam  General:  Well developed, well nourished, no acute distress.,OBESE Head:  Normocephalic and atraumatic. Eyes:  PERRLA, no icterus. Neck:  Supple; no masses or thyromegaly. Lungs:  Clear throughout to auscultation. Heart:  Regular rate and rhythm; no murmurs, rubs,  or bruits. Abdomen:  LARGE,SOFT,TENDER EPIGASTRIUM, NO PALP MASS OR HSM,BS+,NO SPLASH Rectal:  BROWN STOOL ,HEME POSITIVE Msk:  Symmetrical with no gross deformities. Normal posture. Neurologic:  Alert and  oriented x4;  grossly normal neurologically. Psych:  Alert and cooperative. Normal mood and affect.   Impression & Recommendations:  Problem # 1:  ABDOMINAL PAIN-EPIGASTRIC (ICD-789.06) Assessment Deteriorated 60 YO WITH HX GASTRIC WEDGE RESECTION FOR LIPOME 2/09 WITH  ANASTAMOTIC ULCER 7/09 AND SMALL PYLORIC ULCER, ULCERATIVE ESOPHAGITIS WITH RECURRENT ABDOMINAL PAIN,NAUSEA,INTERMITTENT  VOMITING,HEME POSITIVE  STOOL.   SUSPECT RECURRENT ULCER,R/O PARTIAL OUTLET OBSTRUCTION  SCHEDULE EGD WITH DR GESSNER 6/10 CBC,BMET CONTINUE two times a day NEXIUM  REFILL REGLAN 10 MG 3 TIMES DAILY BEFORE MEALS PHENERGAN 25 MG Q 6 HOURS AS NEEDED DARVOCET N100 Q 4-6 HOURS AS NEEDED CARAFATE 1 GM AS SLURRY 3 TIMES DAILY BETWEEN MEALS FULL LIQUID DIET,ADD ENSURE OR BOOST TWICE DAILY HOLD ASA .  Problem # 2:  DIVERTICULOSIS-COLON (ICD-562.10) Assessment: Comment Only  Problem # 3:  HYPERTENSION (ICD-401.9) Assessment: Comment Only  Problem # 4:  DIABETES MELLITUS, TYPE II (ICD-250.00) Assessment: Comment Only  Other Orders: EGD (EGD) TLB-CBC Platelet - w/Differential (85025-CBCD) TLB-BMP (Basic Metabolic Panel-BMET) (99991111)  Patient Instructions: 1)  Please go to the lab. 2)  We scheduled the Endoscopy with Dr. Carlean Purl for tomorrow. Directions provided. 3)  Continue the nexium, take twice daily. 4)  We have given you a written prescription for Darvocet to take to the pharmacy. The other 3 prescriptions we sent electrronically. 5)  Full liquid diet brochure provided. 6)  Copy sent to : Cathlean Cower, MD 7)  The medication list was reviewed and reconciled.  All changed / newly prescribed  medications were explained.  A complete medication list was provided to the patient / caregiver. Prescriptions: CARAFATE 1 GM/10ML SUSP (SUCRALFATE) Take 1 gram between meals  #90 gram x 1   Entered by:   Marisue Humble CMA   Authorized by:   Alfredia Ferguson PA-c   Signed by:   Marisue Humble CMA on 03/15/2009   Method used:   [facility]        [facility] [address] [phone]* (retail)       [address]       [place], [place]  [address]       Ph: [phone] or [phone]       Fax: [phone]   RxID:   [id number] DARVOCET-N 100 100-650 MG TABS (PROPOXYPHENE N-APAP) Take 1 tab every 4 hours as needed for pain  #40 x 0   Entered by:   Marisue Humble  CMA   Authorized by:   Alfredia Ferguson PA-c   Signed by:   Marisue Humble CMA on 03/15/2009   Method used:   Printed then faxed to ...       [facility] [address] [phone]* (retail)       [address]       [place], [place]  [address]       Ph: [phone] or [phone]       Fax: [phone]   RxID:   [id number] PROMETHAZINE HCL 25 MG TABS (PROMETHAZINE HCL) Take 1 tab ebery 6 hours as needed for nausea  #60 x 1   Entered by:   Marisue Humble CMA   Authorized by:   Alfredia Ferguson PA-c   Signed by:   Marisue Humble CMA on 03/15/2009   Method used:   Electronically to        [facility] [address] [phone]* (retail)       [address]       [place], [place]  [address]       Ph: [phone] or [phone]       Fax: [phone]   RxID:   [id number] REGLAN 10 MG TABS (METOCLOPRAMIDE HCL) Take 1 tab 30 min prior to meals  #90 x 1   Entered by:   Marisue Humble CMA   Authorized by:   Alfredia Ferguson PA-c   Signed by:   Marisue Humble CMA on 03/15/2009   Method used:   Electronically to        [facility] [address] [phone]* (retail)       [address]       [place], [place]  [address]       Ph: [phone] or [phone]       Fax: [phone]   RxID:   [id number]

## 2010-11-06 NOTE — Consult Note (Signed)
Summary: [facility] By: Jerrye Noble D'jimraou 03/16/2008 15:24:25  _____________________________________________________________________  External Attachment:    Type:   Image     Comment:   External Document

## 2010-11-06 NOTE — Progress Notes (Signed)
Phone Note Refill Request Message from:  Patient on May 01, 2010 11:03 AM  Refills Requested: Medication #1:  METFORMIN HCL 500 MG TB24 1 by mouth two times a day   Dosage confirmed as above?Dosage Confirmed   Notes: [facility]  Medication #2:  ISOSORBIDE DINITRATE 10 MG  TABS 1 by mouth two times a day   Dosage confirmed as above?Dosage Confirmed   Notes: [facility]  Medication #3:  METOPROLOL TARTRATE 50 MG TABS 1po two times a day   Dosage confirmed as above?Dosage Confirmed   Notes: [facility]  Medication #4:  EXFORGE 10-320 MG TABS 1 by mouth once daily   Dosage confirmed as above?Dosage Confirmed   Notes: [facility] Initial call taken by: Robin Ewing CMA ([place]),  May 01, 2010 11:04 AM    Prescriptions: PRILOSEC 40 MG CPDR (OMEPRAZOLE) one tablet by mouth once daily  #90 x 3   Entered by:   Shirlean Mylar Ewing CMA ([place])   Authorized by:   Biagio Borg MD   Signed by:   Sharon Seller CMA ([place]) on 05/01/2010   Method used:   Faxed to ...       [facility] (mail-order)             , [place]         Ph: [phone]       Fax: [phone]   RxID:   [id number] OXYCODONE-ACETAMINOPHEN 5-325 MG TABS (OXYCODONE-ACETAMINOPHEN) Take 1-2 tablets every 6 hours as needede  #0 x 0   Entered by:   Shirlean Mylar Ewing CMA ([place])   Authorized by:   Biagio Borg MD   Signed by:   Sharon Seller CMA ([place]) on 05/01/2010   Method used:   Faxed to ...       [facility] (mail-order)             , [place]         Ph: [phone]       Fax: [phone]   RxID:   [id number] REGLAN 10 MG TABS (METOCLOPRAMIDE HCL) Take 1 tab 30 min prior to meals  #90 x 0   Entered by:   Shirlean Mylar Ewing CMA ([place])   Authorized by:   Biagio Borg MD   Signed by:   Sharon Seller CMA ([place]) on 05/01/2010   Method used:   Faxed to ...       [facility] (mail-order)             , [place]         Ph: [phone]       Fax: [phone]   RxID:   [id number] ESTRADIOL 1 MG TABS (ESTRADIOL) 1 by mouth once daily  #90 x 3   Entered by:   Shirlean Mylar Ewing CMA  ([place])   Authorized by:   Biagio Borg MD   Signed by:   Sharon Seller CMA ([place]) on 05/01/2010   Method used:   Faxed to ...       [place] (mail-order)             , [place]         Ph: [phone]       Fax: [phone]   RxID:   [id number] ZOLOFT 50 MG  TABS (SERTRALINE HCL) 1 by mouth once daily - generic ok  #90 x 3   Entered by:   Shirlean Mylar Ewing CMA ([place])   Authorized by:   Biagio Borg MD   Signed by:  Robin Ewing CMA ([place]) on 05/01/2010   Method used:   Faxed to ...       [place] (mail-order)             , [place]         Ph: [phone]       Fax: [phone]   RxID:   [id number] JANUVIA 100 MG  TABS (SITAGLIPTIN PHOSPHATE) 1 by mouth once daily  #90 x 3   Entered by:   Shirlean Mylar Ewing CMA ([place])   Authorized by:   Biagio Borg MD   Signed by:   Sharon Seller CMA ([place]) on 05/01/2010   Method used:   Faxed to ...       [place] (mail-order)             , [place]         Ph: [phone]       Fax: [phone]   RxID:   [id number] EXFORGE 10-320 MG TABS (AMLODIPINE BESYLATE-VALSARTAN) 1 by mouth once daily  #90 x 3   Entered by:   Shirlean Mylar Ewing CMA ([place])   Authorized by:   Biagio Borg MD   Signed by:   Sharon Seller CMA ([place]) on 05/01/2010   Method used:   Faxed to ...       [facility] (mail-order)             , [place]         Ph: [phone]       Fax: [phone]   RxIDXG:[id number] METOPROLOL TARTRATE 50 MG TABS (METOPROLOL TARTRATE) 1po two times a day  #180 x 3   Entered by:   Shirlean Mylar Ewing CMA ([place])   Authorized by:   Biagio Borg MD   Signed by:   Sharon Seller CMA ([place]) on 05/01/2010   Method used:   Faxed to ...       [place] (mail-order)             , [place]         Ph: [phone]       Fax: [phone]   RxIDIM:[id number] ISOSORBIDE DINITRATE 10 MG  TABS (ISOSORBIDE DINITRATE) 1 by mouth two times a day  #180 x 3   Entered by:   Shirlean Mylar Ewing CMA ([place])   Authorized by:   Biagio Borg MD   Signed by:   Sharon Seller CMA ([place]) on 05/01/2010   Method used:    Faxed to ...       [place] (mail-order)             , [place]         Ph: [phone]       Fax: [phone]   RxID:   [id number] METFORMIN HCL 500 MG TB24 (METFORMIN HCL) 1 by mouth two times a day  #180 x 3   Entered by:   Shirlean Mylar Ewing CMA ([place])   Authorized by:   Biagio Borg MD   Signed by:   Sharon Seller CMA ([place]) on 05/01/2010   Method used:   Faxed to ...       [place] (mail-order)             , [place]         Ph: [phone]       Fax: [phone]   RxID:   [id number]

## 2010-11-06 NOTE — Consult Note (Signed)
Summary: [facility]   Imported By: Edmonia  01/11/2008 13:51:37  _____________________________________________________________________  External Attachment:    Type:   Image     Comment:   External Document

## 2010-11-06 NOTE — Progress Notes (Signed)
Summary: need another appt for scan  Phone Note Call from Patient Call back at Home Phone [phone]   Caller: Patient Call For: dr Jenny Reichmann Summary of Call: need another app to have the ct scan done  missed the last appt she was scheduled for per pt need an appt still having pain Patient's chart has been requested.  Initial call taken by: Nonah Mattes,  September 14, 2007 1:27 PM  Follow-up for Phone Call        ok to schedule ct abd/pelvis - I will order Follow-up by: Biagio Borg MD,  September 14, 2007 2:26 PM  Additional Follow-up for Phone Call Additional follow up Details #1::        Phone Call Completed, Provider Notified  left msg on machine stated pcc will contact her in a couple of days to have this scheduled  Additional Follow-up by: Nonah Mattes,  September 14, 2007 3:24 PM

## 2010-11-06 NOTE — Progress Notes (Signed)
  Phone Note Refill Request Message from:  Fax from Pharmacy on June 17, 2008 9:09 AM  Refills Requested: Medication #1:  ZOLPIDEM TARTRATE 10 MG  TABS 1 by mouth at bedtime prn   Last Refilled: 02/27/2008  Method Requested: Electronic Initial call taken by: Sherlean Foot CMA,  June 17, 2008 9:10 AM  Follow-up for Phone Call        done hardcopy to debra Follow-up by: Biagio Borg MD,  June 17, 2008 10:29 AM  Additional Follow-up for Phone Call Additional follow up Details #1::        [facility] [address] [phone]* (retail) [address] [place], [place]  [address] Ph: [phone] or [phone] Fax: [phone]  informed pt that rx was faxed Additional Follow-up by: Nonah Mattes,  June 17, 2008 10:47 AM    New/Updated Medications: ZOLPIDEM TARTRATE 10 MG  TABS (ZOLPIDEM TARTRATE) 1 by mouth at bedtime prn   Prescriptions: ZOLPIDEM TARTRATE 10 MG  TABS (ZOLPIDEM TARTRATE) 1 by mouth at bedtime prn  #30 x 5   Entered and Authorized by:   Biagio Borg MD   Signed by:   Biagio Borg MD on 06/17/2008   Method used:   Print then Give to Patient   RxID:   [id number]

## 2010-11-09 ENCOUNTER — Encounter: Payer: Self-pay | Admitting: Gastroenterology

## 2010-11-09 ENCOUNTER — Ambulatory Visit (INDEPENDENT_AMBULATORY_CARE_PROVIDER_SITE_OTHER): Payer: BC Managed Care – PPO | Admitting: Gastroenterology

## 2010-11-09 DIAGNOSIS — K219 Gastro-esophageal reflux disease without esophagitis: Secondary | ICD-10-CM

## 2010-11-09 DIAGNOSIS — K5900 Constipation, unspecified: Secondary | ICD-10-CM

## 2010-11-14 NOTE — Assessment & Plan Note (Signed)
Summary: refill of meds   History of Present Illness Visit Type: Follow-up Visit Primary GI MD: Joylene Igo MD [facility] Primary Provider: Cathlean Cower, MD Requesting Provider: n/a Chief Complaint: refill of Carafate c/o belching and problems moving bowels unless pt. massages stomach  History of Present Illness:   [name] returns for followup today, complaining of mild constipation. She has frequent belching, but generally feels GI symptoms have been under good control for the past year.   GI Review of Systems    Reports belching and  bloating.      Denies abdominal pain, acid reflux, chest pain, dysphagia with liquids, dysphagia with solids, heartburn, loss of appetite, nausea, vomiting, vomiting blood, weight loss, and  weight gain.      Reports constipation.     Denies anal fissure, black tarry stools, change in bowel habit, diarrhea, diverticulosis, fecal incontinence, heme positive stool, hemorrhoids, irritable bowel syndrome, jaundice, light color stool, liver problems, rectal bleeding, and  rectal pain.   Current Medications (verified): 1)  Metformin Hcl 500 Mg Tb24 (Metformin Hcl) .Marland Kitchen.. 1 By Mouth Two Times A Day 2)  Simvastatin 20 Mg Tabs (Simvastatin) .Marland Kitchen.. 1po Once Daily 3)  Isosorbide Dinitrate 10 Mg  Tabs (Isosorbide Dinitrate) .Marland Kitchen.. 1 By Mouth Two Times A Day 4)  Metoprolol Tartrate 50 Mg Tabs (Metoprolol Tartrate) .Marland Kitchen.. 1po Two Times A Day 5)  Exforge 10-320 Mg Tabs (Amlodipine Besylate-Valsartan) .Marland Kitchen.. 1 By Mouth Once Daily 6)  Januvia 100 Mg  Tabs (Sitagliptin Phosphate) .Marland Kitchen.. 1 By Mouth Once Daily 7)  Ecotrin Low Strength 81 Mg  Tbec (Aspirin) .Marland Kitchen.. 1po Qd 8)  Zoloft 50 Mg  Tabs (Sertraline Hcl) .Marland Kitchen.. 1 By Mouth Once Daily - Generic Ok 9)  Estradiol 1 Mg Tabs (Estradiol) .Marland Kitchen.. 1 By Mouth Once Daily 10)  Promethazine Hcl 25 Mg Tabs (Promethazine Hcl) .... Take 1 Tab Ebery 6 Hours As Needed For Nausea 11)  Prilosec 40 Mg Cpdr (Omeprazole) .... One Tablet By Mouth Once  Daily 12)  Oxycodone-Acetaminophen 5-325 Mg Tabs (Oxycodone-Acetaminophen) .... Take 1-2 Tablets Every 6 Hours As Needede 13)  Carafate 1 Gm/63ml Susp (Sucralfate) .... One Gram Three Times A Day Before Meals 14)  Levemir Flexpen 100 Unit/ml Soln (Insulin Detemir) .Marland Kitchen.. 10 Units Subcutaneously Once Daily  Allergies (verified): 1)  ! Pcn 2)  ! Hydrocodone  Past History:  Past Medical History: Reviewed history from 09/13/2009 and no changes required. 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 Left knee replacement Right knee replacement with revision 04/2008 - dr graves Gastric wedge resection of a gastric lipoma x 2 with laparotomy and gastrostomy 11/2007 right rotater cuff repair - 01/2009 Cholecystectomy 06/2009 s/p  bladder surgury dec 2010 - Dr Terance Hart  Family History: Reviewed history from 03/15/2009 and no changes required. Family History of CAD Female 1st degree relative Family History Diabetes 1st degree relative: Brother x3, Sister x 3 Family History Hypertension No FH of Colon Cancer: Family History of Heart Disease: Brother x 2, Sister x 2  Social History: Reviewed history from 03/15/2009 and no changes required. Former Smoker-stopped 30 years ago Alcohol use-no Married Occupation: Disabled Illicit Drug Use - no Patient gets regular exercise.  Review of Systems  The patient complains of urination - excessive and urination changes/pain.         The pertinent positives and negatives are noted as above and in the HPI. All other ROS were reviewed and were negative.   Vital Signs:  Patient profile:   60 year  old female Height:      66 inches Weight:      285 pounds BMI:     46.17 Pulse rate:   88 / minute Pulse rhythm:   regular BP sitting:   122 / 74  (right arm)  Vitals Entered By: Randye Lobo NCMA (November 09, 2010 3:49 PM)  Physical Exam  General:  Well developed, well nourished, no acute distress. obese.   Head:  Normocephalic and atraumatic. Mouth:  No deformity or lesions, dentition normal. Lungs:  Clear throughout to auscultation. Heart:  Regular rate and rhythm; no murmurs, rubs,  or bruits. Abdomen:  Soft, nontender and nondistended. No masses, hepatosplenomegaly or hernias noted. Normal bowel sounds. Psych:  Alert and cooperative. Normal mood and affect.  Impression & Recommendations:  Problem # 1:  GERD (ICD-530.81) Continue omeprazole 40 mg daily and Carafate 1 g t.i.d.  Problem # 2:  ULCER-GASTRIC (ICD-531.9) Plans as above.  Patient Instructions: 1)  Refill of Carafate has been sent to your pharmacy.  2)  Please continue current medications.  3)  Constipation and Hemorrhoids brochure given.  4)  Please schedule a follow-up appointment as needed.  5)  Copy sent to : Cathlean Cower, MD 6)  The medication list was reviewed and reconciled.  All changed / newly prescribed medications were explained.  A complete medication list was provided to the patient / caregiver.  Prescriptions: CARAFATE 1 GM/10ML SUSP (SUCRALFATE) one gram three times a day before meals  #90 x 11   Entered by:   Marlon Pel CMA ([place])   Authorized by:   Ladene Artist MD [facility]   Signed by:   Marlon Pel CMA ([place]) on 11/09/2010   Method used:   Electronically to        [facility] [address] [phone]* (retail)       [address]       [place],   [address]       Ph: [phone] or [phone]       Fax: [phone]   RxID:   [id number]

## 2010-12-07 ENCOUNTER — Telehealth: Payer: Self-pay | Admitting: Gastroenterology

## 2010-12-08 ENCOUNTER — Inpatient Hospital Stay (HOSPITAL_COMMUNITY)
Admission: EM | Admit: 2010-12-08 | Discharge: 2010-12-11 | DRG: 813 | Disposition: A | Discharge status: Home / Self Care | Payer: BC Managed Care – PPO | Attending: Internal Medicine | Admitting: Internal Medicine

## 2010-12-08 ENCOUNTER — Emergency Department (HOSPITAL_COMMUNITY): Payer: BC Managed Care – PPO

## 2010-12-08 ENCOUNTER — Encounter: Payer: Self-pay | Admitting: Family Medicine

## 2010-12-08 ENCOUNTER — Ambulatory Visit (INDEPENDENT_AMBULATORY_CARE_PROVIDER_SITE_OTHER): Payer: Medicare Other | Admitting: Family Medicine

## 2010-12-08 ENCOUNTER — Encounter: Payer: Self-pay | Admitting: Physician Assistant

## 2010-12-08 DIAGNOSIS — R1032 Left lower quadrant pain: Secondary | ICD-10-CM

## 2010-12-08 DIAGNOSIS — E669 Obesity, unspecified: Secondary | ICD-10-CM | POA: Diagnosis present

## 2010-12-08 DIAGNOSIS — K219 Gastro-esophageal reflux disease without esophagitis: Secondary | ICD-10-CM | POA: Diagnosis present

## 2010-12-08 DIAGNOSIS — I10 Essential (primary) hypertension: Secondary | ICD-10-CM | POA: Diagnosis present

## 2010-12-08 DIAGNOSIS — E785 Hyperlipidemia, unspecified: Secondary | ICD-10-CM | POA: Diagnosis present

## 2010-12-08 DIAGNOSIS — D62 Acute posthemorrhagic anemia: Secondary | ICD-10-CM | POA: Diagnosis present

## 2010-12-08 DIAGNOSIS — E119 Type 2 diabetes mellitus without complications: Secondary | ICD-10-CM | POA: Diagnosis present

## 2010-12-08 DIAGNOSIS — A09 Infectious gastroenteritis and colitis, unspecified: Principal | ICD-10-CM | POA: Diagnosis present

## 2010-12-08 LAB — URINALYSIS, ROUTINE W REFLEX MICROSCOPIC
Bilirubin Urine: NEGATIVE
Glucose, UA: NEGATIVE mg/dL
Hgb urine dipstick: NEGATIVE
Ketones, ur: NEGATIVE mg/dL
Nitrite: NEGATIVE
Protein, ur: NEGATIVE mg/dL
Specific Gravity, Urine: 1.024 (ref 1.005–1.030)
Urobilinogen, UA: 0.2 mg/dL (ref 0.0–1.0)
pH: 6 (ref 5.0–8.0)

## 2010-12-08 LAB — DIFFERENTIAL
Basophils Absolute: 0 10*3/uL (ref 0.0–0.1)
Basophils Relative: 0 % (ref 0–1)
Eosinophils Absolute: 0.3 10*3/uL (ref 0.0–0.7)
Eosinophils Relative: 3 % (ref 0–5)
Lymphocytes Relative: 25 % (ref 12–46)
Lymphs Abs: 2.2 10*3/uL (ref 0.7–4.0)
Monocytes Absolute: 0.4 10*3/uL (ref 0.1–1.0)
Monocytes Relative: 4 % (ref 3–12)
Neutro Abs: 5.8 10*3/uL (ref 1.7–7.7)
Neutrophils Relative %: 67 % (ref 43–77)

## 2010-12-08 LAB — CBC
HCT: 33.8 % — ABNORMAL LOW (ref 36.0–46.0)
Hemoglobin: 10.6 g/dL — ABNORMAL LOW (ref 12.0–15.0)
MCH: 29.9 pg (ref 26.0–34.0)
MCHC: 31.4 g/dL (ref 30.0–36.0)
MCV: 95.5 fL (ref 78.0–100.0)
Platelets: 321 10*3/uL (ref 150–400)
RBC: 3.54 MIL/uL — ABNORMAL LOW (ref 3.87–5.11)
RDW: 13.4 % (ref 11.5–15.5)
WBC: 8.7 10*3/uL (ref 4.0–10.5)

## 2010-12-08 LAB — APTT: aPTT: 37 seconds (ref 24–37)

## 2010-12-08 LAB — PROTIME-INR
INR: 1.25 (ref 0.00–1.49)
Prothrombin Time: 15.9 seconds — ABNORMAL HIGH (ref 11.6–15.2)

## 2010-12-08 LAB — COMPREHENSIVE METABOLIC PANEL
ALT: 23 U/L (ref 0–35)
AST: 26 U/L (ref 0–37)
Albumin: 3.4 g/dL — ABNORMAL LOW (ref 3.5–5.2)
Alkaline Phosphatase: 109 U/L (ref 39–117)
BUN: 10 mg/dL (ref 6–23)
CO2: 25 mEq/L (ref 19–32)
Calcium: 9.2 mg/dL (ref 8.4–10.5)
Chloride: 103 mEq/L (ref 96–112)
Creatinine, Ser: 1.03 mg/dL (ref 0.4–1.2)
GFR calc Af Amer: >60 mL/min (ref >60)
GFR calc non Af Amer: 55 mL/min — ABNORMAL LOW (ref >60)
Glucose, Bld: 153 mg/dL — ABNORMAL HIGH (ref 70–99)
Potassium: 3.6 mEq/L (ref 3.5–5.1)
Sodium: 138 mEq/L (ref 135–145)
Total Bilirubin: 0.7 mg/dL (ref 0.3–1.2)
Total Protein: 7.8 g/dL (ref 6.0–8.3)

## 2010-12-08 LAB — LIPASE, BLOOD: Lipase: 31 U/L (ref 11–59)

## 2010-12-08 LAB — HEMOGLOBIN AND HEMATOCRIT, BLOOD
HCT: 28.6 % — ABNORMAL LOW (ref 36.0–46.0)
Hemoglobin: 9.1 g/dL — ABNORMAL LOW (ref 12.0–15.0)

## 2010-12-08 LAB — OCCULT BLOOD, POC DEVICE: Fecal Occult Bld: POSITIVE

## 2010-12-08 MED ORDER — IOHEXOL 300 MG/ML  SOLN
125.0000 mL | Freq: Once | INTRAMUSCULAR | Status: AC | PRN
  Administered 2010-12-08: 125 mL via INTRAVENOUS

## 2010-12-09 LAB — HEMOGLOBIN AND HEMATOCRIT, BLOOD
HCT: 29.1 % — ABNORMAL LOW (ref 36.0–46.0)
HCT: 27.1 % — ABNORMAL LOW (ref 36.0–46.0)
HCT: 30.5 % — ABNORMAL LOW (ref 36.0–46.0)
Hemoglobin: 9.1 g/dL — ABNORMAL LOW (ref 12.0–15.0)
Hemoglobin: 8.5 g/dL — ABNORMAL LOW (ref 12.0–15.0)
Hemoglobin: 9.4 g/dL — ABNORMAL LOW (ref 12.0–15.0)

## 2010-12-09 LAB — BASIC METABOLIC PANEL
BUN: 6 mg/dL (ref 6–23)
CO2: 24 mEq/L (ref 19–32)
Calcium: 8.5 mg/dL (ref 8.4–10.5)
Chloride: 110 mEq/L (ref 96–112)
Creatinine, Ser: 0.86 mg/dL (ref 0.4–1.2)
GFR calc Af Amer: >60 mL/min (ref >60)
GFR calc non Af Amer: >60 mL/min (ref >60)
Glucose, Bld: 119 mg/dL — ABNORMAL HIGH (ref 70–99)
Potassium: 3.6 mEq/L (ref 3.5–5.1)
Sodium: 141 mEq/L (ref 135–145)

## 2010-12-09 LAB — GLUCOSE, CAPILLARY
Glucose-Capillary: 126 mg/dL — ABNORMAL HIGH (ref 70–99)
Glucose-Capillary: 137 mg/dL — ABNORMAL HIGH (ref 70–99)
Glucose-Capillary: 158 mg/dL — ABNORMAL HIGH (ref 70–99)
Glucose-Capillary: 106 mg/dL — ABNORMAL HIGH (ref 70–99)
Glucose-Capillary: 117 mg/dL — ABNORMAL HIGH (ref 70–99)
Glucose-Capillary: 165 mg/dL — ABNORMAL HIGH (ref 70–99)

## 2010-12-09 NOTE — H&P (Signed)
[name], [name]               ACCOUNT NO.:  [id number]  MEDICAL RECORD NO.:  [id number]           PATIENT TYPE:  I  LOCATION:  1430                         FACILITY:  [facility]  PHYSICIAN:  Derrill Kay, MD       DATE OF BIRTH:  Sep 01, 1951  DATE OF ADMISSION:  12/08/2010 DATE OF DISCHARGE:                             HISTORY & PHYSICAL   CHIEF COMPLAINT:  Abdominal pain, left lower quadrant.  HISTORY OF PRESENT ILLNESS:  [name] is a 60 year old African American female who started having left lower quadrant abdominal pain yesterday suddenly with some bloody diarrhea.  She has had about 4 episodes of bloody stools, the last one being this morning.  She denies any vomiting.  She has been having persistent left lower quadrant pain. Denies any fevers.  Denies any rashes on her body.  Denies any dysuria. CT of her abdomen and pelvis is revealing for colitis.  REVIEW OF SYSTEMS:  Otherwise negative.  PAST MEDICAL HISTORY: 1. Non-insulin-dependent diabetes. 2. Hypertension. 3. Hyperlipidemia. 4. Status post complete hysterectomy. 5. Bilateral knee replacements. 6. Chronic back pain. 7. Obesity. 8. GERD.  ALLERGIES:  PENICILLIN CAUSES ITCH AND RASH.  MEDICATIONS: 1. Zoloft 50 mg a day. 2. Metoprolol 50 mg twice a day. 3. Metformin 500 mg twice a day. 4. Amlodipine/valsartan 10/320 mg daily. 5. Januvia 100 mg daily. 6. Estrace 1 mg daily. 7. Simvastatin 20 mg daily. 8. Levemir 10 units daily. 9. Imdur 10 mg twice a day. 10.Oxycodone/acetaminophen 5/325 one to two every 6 hours as needed     for pain. 11.Aspirin 81 mg a day. 12.Promethazine 25 mg every 6 hours. 13.Carafate suspension t.i.d. a.c. 14.Prilosec 40 mg daily.  PHYSICAL EXAMINATION:  VITALS:  She is afebrile.  Blood pressure 145/60, pulse in the 70s, respiratory rate 18. GENERAL:  Alert and oriented x4, no apparent distress, cooperative and friendly. HEART:  Regular rate and rhythm without murmurs, rubs,  or gallops. CHEST:  Clear to auscultation bilaterally.  No wheezes, rales, or rhonchi. ABDOMEN:  Soft.  She is mildly tender to palpation in the left lower quadrant, nondistended.  Positive bowel sounds.  No rebound, no guarding.  Nonacute abdomen. EXTREMITIES:  No clubbing, cyanosis, or edema. PSYCH:  Normal affect. NEURO:  No focal neurologic deficit. SKIN:  No rashes.  LABORATORY DATA:  Again, CT of the abdomen and pelvis is consistent with colitis.  CBC, white count is 8.7, hemoglobin is 10.6.  BUN and creatinine are normal.  Electrolytes normal.  Glucose 153.  Coags normal.  Lipase normal.  Urinalysis normal.  ASSESSMENT/PLAN:  This is a 60 year old female with acute colitis. 1. Acute colitis, placed on ciprofloxacin and Flagyl.  Clear liquid     diet.  Abdominal exam is benign at this time. 2. Report of bright red blood per rectum, likely secondary to the     colitis.  We will serial her hemoglobin every 8 hours and have     staff call if her hemoglobin drops below 9.  She has not had any     bloody stools since earlier this morning.  We will monitor  this     closely.  We will Hemoccult her stools. 3. Hypertension.  Continue her home medications. 4. Diabetes, hold her metformin and continue her other oral     medications. 5. The patient is full code.  Further recommendations depending on her     hospital course.          ______________________________ Derrill Kay, MD     RD/MEDQ  D:  12/08/2010  T:  12/09/2010  Job:  [id number]  Electronically Signed by Steward Ros MD on 12/09/2010 02:48:54 PM

## 2010-12-10 LAB — CBC
HCT: 30.8 % — ABNORMAL LOW (ref 36.0–46.0)
Hemoglobin: 9.4 g/dL — ABNORMAL LOW (ref 12.0–15.0)
MCH: 29.6 pg (ref 26.0–34.0)
MCHC: 30.5 g/dL (ref 30.0–36.0)
MCV: 96.9 fL (ref 78.0–100.0)
Platelets: 267 10*3/uL (ref 150–400)
RBC: 3.18 MIL/uL — ABNORMAL LOW (ref 3.87–5.11)
RDW: 13.6 % (ref 11.5–15.5)
WBC: 7.8 10*3/uL (ref 4.0–10.5)

## 2010-12-10 LAB — GLUCOSE, CAPILLARY
Glucose-Capillary: 164 mg/dL — ABNORMAL HIGH (ref 70–99)
Glucose-Capillary: 163 mg/dL — ABNORMAL HIGH (ref 70–99)
Glucose-Capillary: 175 mg/dL — ABNORMAL HIGH (ref 70–99)
Glucose-Capillary: 200 mg/dL — ABNORMAL HIGH (ref 70–99)
Glucose-Capillary: 139 mg/dL — ABNORMAL HIGH (ref 70–99)

## 2010-12-10 LAB — COMPREHENSIVE METABOLIC PANEL
ALT: 22 U/L (ref 0–35)
AST: 24 U/L (ref 0–37)
Albumin: 2.8 g/dL — ABNORMAL LOW (ref 3.5–5.2)
Alkaline Phosphatase: 85 U/L (ref 39–117)
BUN: 6 mg/dL (ref 6–23)
CO2: 26 mEq/L (ref 19–32)
Calcium: 8.6 mg/dL (ref 8.4–10.5)
Chloride: 111 mEq/L (ref 96–112)
Creatinine, Ser: 0.85 mg/dL (ref 0.4–1.2)
GFR calc Af Amer: >60 mL/min (ref >60)
GFR calc non Af Amer: >60 mL/min (ref >60)
Glucose, Bld: 149 mg/dL — ABNORMAL HIGH (ref 70–99)
Potassium: 4.2 mEq/L (ref 3.5–5.1)
Sodium: 143 mEq/L (ref 135–145)
Total Bilirubin: 0.4 mg/dL (ref 0.3–1.2)
Total Protein: 6.3 g/dL (ref 6.0–8.3)

## 2010-12-10 LAB — URINE CULTURE: Culture  Setup Time: 201203031904

## 2010-12-10 LAB — HEMOGLOBIN AND HEMATOCRIT, BLOOD
HCT: 29.2 % — ABNORMAL LOW (ref 36.0–46.0)
HCT: 29.5 % — ABNORMAL LOW (ref 36.0–46.0)
Hemoglobin: 8.9 g/dL — ABNORMAL LOW (ref 12.0–15.0)
Hemoglobin: 9.1 g/dL — ABNORMAL LOW (ref 12.0–15.0)

## 2010-12-11 DIAGNOSIS — R933 Abnormal findings on diagnostic imaging of other parts of digestive tract: Secondary | ICD-10-CM

## 2010-12-11 DIAGNOSIS — K921 Melena: Secondary | ICD-10-CM

## 2010-12-11 LAB — DIFFERENTIAL
Basophils Absolute: 0 10*3/uL (ref 0.0–0.1)
Basophils Relative: 0 % (ref 0–1)
Eosinophils Absolute: 0.4 10*3/uL (ref 0.0–0.7)
Eosinophils Relative: 5 % (ref 0–5)
Lymphocytes Relative: 34 % (ref 12–46)
Lymphs Abs: 2.6 10*3/uL (ref 0.7–4.0)
Monocytes Absolute: 0.3 10*3/uL (ref 0.1–1.0)
Monocytes Relative: 4 % (ref 3–12)
Neutro Abs: 4.3 10*3/uL (ref 1.7–7.7)
Neutrophils Relative %: 56 % (ref 43–77)

## 2010-12-11 LAB — GLUCOSE, CAPILLARY
Glucose-Capillary: 115 mg/dL — ABNORMAL HIGH (ref 70–99)
Glucose-Capillary: 131 mg/dL — ABNORMAL HIGH (ref 70–99)
Glucose-Capillary: 144 mg/dL — ABNORMAL HIGH (ref 70–99)
Glucose-Capillary: 166 mg/dL — ABNORMAL HIGH (ref 70–99)

## 2010-12-11 LAB — CBC
HCT: 28.4 % — ABNORMAL LOW (ref 36.0–46.0)
Hemoglobin: 8.6 g/dL — ABNORMAL LOW (ref 12.0–15.0)
MCH: 29.4 pg (ref 26.0–34.0)
MCHC: 30.3 g/dL (ref 30.0–36.0)
MCV: 96.9 fL (ref 78.0–100.0)
Platelets: 271 10*3/uL (ref 150–400)
RBC: 2.93 MIL/uL — ABNORMAL LOW (ref 3.87–5.11)
RDW: 13.5 % (ref 11.5–15.5)
WBC: 7.7 10*3/uL (ref 4.0–10.5)

## 2010-12-11 LAB — HEMOGLOBIN AND HEMATOCRIT, BLOOD
HCT: 28.3 % — ABNORMAL LOW (ref 36.0–46.0)
Hemoglobin: 8.8 g/dL — ABNORMAL LOW (ref 12.0–15.0)

## 2010-12-13 NOTE — Progress Notes (Signed)
Summary: triage   Phone Note Call from Patient Call back at Home Phone [phone]   Caller: Patient Call For: Dr Fuller Plan Summary of Call: Severe nausea and vomitting for about an hour now wants to know what to do. Initial call taken by: Ronalee Red,  December 07, 2010 3:49 PM  Follow-up for Phone Call        Patient c/o 1 hour of uncontrolled vomiting and had a BM wiht a larg amount of bright red blood.  These are new symptoms for her.  I is c/o abdominal discomfort.  Patient is advised to go to the ER for eval and to not drive herself.  Patient verbalized understanding.   Dr Carlean Purl is on call for the weekend. I have paged him to notify him of the above. Follow-up by: Barb Merino RN, CGRN,  December 07, 2010 4:19 PM  Additional Follow-up for Phone Call Additional follow up Details #1::        Dr Carlean Purl notified of the above.   Additional Follow-up by: Barb Merino RN, CGRN,  December 07, 2010 4:25 PM

## 2010-12-13 NOTE — Assessment & Plan Note (Signed)
Summary: blood in stool,LLQ pain   Vital Signs:  Patient profile:   60 year old female Height:      66 inches Weight:      282 pounds BMI:     45.68 O2 Sat:      98 % on Room air Temp:     98.1 degrees F oral Pulse rate:   111 / minute BP sitting:   130 / 80  (left arm) Cuff size:   large  Vitals Entered By: Sherlean Foot CMA (December 08, 2010 10:50 AM)  O2 Flow:  Room air CC: LLQ pain radiates to L back. Also noticed blood in stool last night   Primary Care Provider:  Cathlean Cower, MD  CC:  LLQ pain radiates to L back. Also noticed blood in stool last night.  History of Present Illness: LLQ pain radiates to L back. Also noticed blood in stool last night.  Never had happened before.  threw up several times yesterday.  Then last night thought had to have a bowel movement. had a bloody BM, bright red.  has seen some more blood wiht BMs since then.  No hx of colitis.  no other sick contacts.  Feels a little lightheaded. no fever.  Pain is intermitaant and is sharp.  No hx of crohns, etc. No prior colonosocpy on file.   Allergies: 1)  ! Pcn 2)  ! 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 GERD Hemorrhoids Diverticulosis Gastroparesis  Past Surgical History: Last updated: 05/01/2010 Left knee replacement Right knee replacement with revision 04/2008 - dr graves Gastric wedge resection of a gastric lipoma x 2 with laparotomy and gastrostomy 11/2007 right rotater cuff repair - 01/2009 Cholecystectomy 06/2009 s/p  bladder surgury dec 2010 - Dr Terance Hart  Family History: Last updated: 03/15/2009 Family History of CAD Female 1st degree relative Family History Diabetes 1st degree  relative: Brother x3, Sister x 3 Family History Hypertension No FH of Colon Cancer: Family History of Heart Disease: Brother x 2, Sister x 2  Social History: Last updated: 03/15/2009 Former Smoker-stopped 53 years ago Alcohol use-no Married Occupation: Disabled Illicit Drug Use - no Patient gets regular exercise.  Physical Exam  General:  Well-developed,well-nourished,in no acute distress; alert,appropriate and cooperative throughout examination Head:  Normocephalic and atraumatic without obvious abnormalities. No apparent alopecia or balding. Eyes:  No corneal or conjunctival inflammation noted. EOMI. Perrla. Ears:  External ear exam shows no significant lesions or deformities.  Otoscopic examination reveals clear canals, tympanic membranes are intact bilaterally without bulging, retraction, inflammation or discharge. Hearing is grossly normal bilaterally. Nose:  External nasal examination shows no deformity or inflammation.  Mouth:  Oral mucosa and oropharynx without lesions or exudates.  Teeth in good repair. Neck:  No deformities, masses, or tenderness noted. Lungs:  Normal respiratory effort, chest expands symmetrically. Lungs are clear to auscultation, no crackles or wheezes. Heart:  Normal rate and regular rhythm. S1 and S2 normal without gallop, murmur, click, rub or other extra sounds. Abdomen:  Dec BS.  Very tender in the LLQ and mildy tender in the LUQ.  soft, no hepatomegaly, and no splenomegaly.   Skin:  no rashes.   Cervical Nodes:  No lymphadenopathy noted Psych:  Cognition and judgment appear intact.  Alert and cooperative with normal attention span and concentration. No apparent delusions, illusions, hallucinations   Impression & Recommendations:  Problem # 1:  ABDOMINAL PAIN, LEFT LOWER QUADRANT (ICD-789.04) Dicussed could be colitis vs diverticulitis, etc.  She needs emergent care.  Likley needs CBC and abd/pelvic CT.   told ot go [facility]. Husband is with her and  he will drive. She agrees to go.  Complete Medication List: 1)  Metformin Hcl 500 Mg Tb24 (Metformin hcl) .Marland Kitchen.. 1 by mouth two times a day 2)  Simvastatin 20 Mg Tabs (Simvastatin) .Marland Kitchen.. 1po once daily 3)  Isosorbide Dinitrate 10 Mg Tabs (Isosorbide dinitrate) .Marland Kitchen.. 1 by mouth two times a day 4)  Metoprolol Tartrate 50 Mg Tabs (Metoprolol tartrate) .Marland Kitchen.. 1po two times a day 5)  Exforge 10-320 Mg Tabs (Amlodipine besylate-valsartan) .Marland Kitchen.. 1 by mouth once daily 6)  Januvia 100 Mg Tabs (Sitagliptin phosphate) .Marland Kitchen.. 1 by mouth once daily 7)  Ecotrin Low Strength 81 Mg Tbec (Aspirin) .Marland Kitchen.. 1po qd 8)  Zoloft 50 Mg Tabs (Sertraline hcl) .Marland Kitchen.. 1 by mouth once daily - generic ok 9)  Estradiol 1 Mg Tabs (Estradiol) .Marland Kitchen.. 1 by mouth once daily 10)  Promethazine Hcl 25 Mg Tabs (Promethazine hcl) .... Take 1 tab ebery 6 hours as needed for nausea 11)  Prilosec 40 Mg Cpdr (Omeprazole) .... One tablet by mouth once daily 12)  Oxycodone-acetaminophen 5-325 Mg Tabs (Oxycodone-acetaminophen) .... Take 1-2 tablets every 6 hours as needede 13)  Carafate 1 Gm/68ml Susp (Sucralfate) .... One gram three times a day before meals 14)  Levemir Flexpen 100 Unit/ml Soln (Insulin detemir) .Marland Kitchen.. 10 units subcutaneously once daily  Patient Instructions: 1)  She is having sharp intermittant LLQ pain wiht bloody stools and vomiting.  No fever.  please evaluate in the ED. Will need CBC and possibly Abd/Pelvic CT.    Orders Added: 1)  Est. Patient Level IV [id number]

## 2010-12-19 NOTE — Discharge Summary (Signed)
[name], [name]               ACCOUNT NO.:  [id number]  MEDICAL RECORD NO.:  [id number]           Dyer TYPE:  I  LOCATION:  1430                         FACILITY:  [facility]  PHYSICIAN:  Jacquelynn Cree, M.D.   DATE OF BIRTH:  Feb 14, 1951  DATE OF ADMISSION:  12/08/2010 DATE OF DISCHARGE:  12/11/2010                              DISCHARGE SUMMARY   PRIMARY CARE PHYSICIAN:  Biagio Borg, MD  DISCHARGE DIAGNOSES: 1. Infectious colitis. 2. Acute on chronic anemia with recent gastrointestinal bleeding     contributory. 3. Hypertension. 4. Type 2 diabetes. 5. Obesity. 6. History of dyslipidemia. 7. Chronic back pain. 8. Gastroesophageal reflux disease.  DISCHARGE MEDICATIONS: 1. Cipro 500 mg p.o. b.i.d. x4 days. 2. Bentyl 10 mg p.o. t.i.d. p.r.n. abdominal cramps. 3. Flagyl 500 mg p.o. q.8 h p.o. x4 days. 4. Ondansetron 4 mg ODT tablets 1 sublingual q.6 hours p.r.n. nausea. 5. Aspirin  81 mg p.o. daily. 6. Carafate 1 g p.o. t.i.d. q.a.c. 7. Estrace 1 mg p.o. daily. 8. Exforge 10/320 one tablet p.o. daily. 9. Isosorbide dinitrate 10 mg p.o. b.i.d. 10.Januvia 100 mg p.o. daily. 11.Levemir 10 units subcutaneously daily. 12.Metformin XR 500 mg p.o. b.i.d. 13.Metoprolol tartrate 50 mg p.o. b.i.d. 14.Percocet 5/325 one to two tablets p.o. q.6 h p.r.n. pain. 15.Prilosec 40 mg p.o. daily. 16.Promethazine 25 mg p.o. q.6 h p.r.n. nausea. 17.Simvastatin 20 mg p.o. daily. 18.Zoloft 50 mg p.o. daily.  CONSULTATIONS:  Dr. Carlean Purl of Gastroenterology.  BRIEF ADMISSION HPI:  [name] is a 60 year old female who presented to [facility] hospital with left lower quadrant abdominal pain accompanied by bloody diarrhea, approximately 4 episodes prior to presentation.  Upon initial evaluation in [facility] emergency department, a CT scan of Sherry abdomen and pelvis was obtained which confirmed colitis.  [name] subsequently was referred to [facility] hospitalist service for further inpatient evaluation  and treatment.  For Sherry full details, please see Sherry dictated report done by Dr. Shanon Brow.  PROCEDURES AND DIAGNOSTIC STUDIES: 1. CT scan of Sherry abdomen on December 08, 2010, showed mucosal edema     throughout Sherry colon, especially in Sherry right colon suggestive of     colitis, likely infectious in nature.  No free fluid or abscess.  DISCHARGE LABORATORY VALUES:  White blood cell count was 7.7, hemoglobin 8.6, hematocrit 28.4, platelets 271.  Urine cultures were negative. Sodium was 143, potassium 4.2, chloride 111, bicarb 26, BUN 6, creatinine 0.85, glucose 149.  Liver function studies were within normal limits with Sherry exception of a low albumin at 2.8.  Lipase was 31. Fecal occult blood testing was positive.  HOSPITAL COURSE BY PROBLEM: 1. Infectious colitis:  [name] was admitted and put on IV Cipro     and Flagyl empirically.  She has now completed 3 days of treatment     out of a planned course of 7 days of therapy as recommended by Dr.     Carlean Purl of Gastroenterology.  She will be discharged on oral Cipro     and Flagyl for an additional 4 days of therapy. 2. Acute on chronic anemia with recent GI  bleeding contributory:  Sherry     Dyer has active colitis and her rectal bleeding was felt to be     due to this.  Her hemoglobin and hematocrit have remained stable.     She is encouraged to take an iron supplement at discharge. 3. Hypertension:  [name]'s blood pressure is currently well     controlled on her Exforge. 4. Type 2 diabetes:  [name] had good glycemic control while in     [facility] hospital. 5. Obesity:  [name] was maintained on a carbohydrate modified     diet. 6. History of dyslipidemia:  [name] was maintained on statin     therapy.  DISPOSITION:  [name] is medically stable and requesting discharge.  CONDITION ON DISCHARGE:  Improved.     Jacquelynn Cree, M.D.     CR/MEDQ  D:  12/11/2010  T:  12/11/2010  Job:  [id number]  cc:   Biagio Borg,  MD [address] [address]  Electronically Signed by Jacquelynn Cree M.D. on 12/19/2010 04:56:16 PM

## 2010-12-24 LAB — GLUCOSE, CAPILLARY: Glucose-Capillary: 167 mg/dL — ABNORMAL HIGH (ref 70–99)

## 2010-12-24 LAB — POCT I-STAT 4, (NA,K, GLUC, HGB,HCT)
Glucose, Bld: 153 mg/dL — ABNORMAL HIGH (ref 70–99)
HCT: 35 % — ABNORMAL LOW (ref 36.0–46.0)
Hemoglobin: 11.9 g/dL — ABNORMAL LOW (ref 12.0–15.0)
Potassium: 3.8 mEq/L (ref 3.5–5.1)
Sodium: 140 mEq/L (ref 135–145)

## 2011-01-07 ENCOUNTER — Ambulatory Visit (INDEPENDENT_AMBULATORY_CARE_PROVIDER_SITE_OTHER): Payer: Medicare Other | Admitting: Internal Medicine

## 2011-01-07 ENCOUNTER — Encounter: Payer: Self-pay | Admitting: Internal Medicine

## 2011-01-07 ENCOUNTER — Other Ambulatory Visit (INDEPENDENT_AMBULATORY_CARE_PROVIDER_SITE_OTHER): Payer: BC Managed Care – PPO

## 2011-01-07 ENCOUNTER — Other Ambulatory Visit (INDEPENDENT_AMBULATORY_CARE_PROVIDER_SITE_OTHER): Payer: BC Managed Care – PPO | Admitting: Internal Medicine

## 2011-01-07 DIAGNOSIS — D649 Anemia, unspecified: Secondary | ICD-10-CM

## 2011-01-07 DIAGNOSIS — K921 Melena: Secondary | ICD-10-CM

## 2011-01-07 DIAGNOSIS — E785 Hyperlipidemia, unspecified: Secondary | ICD-10-CM

## 2011-01-07 DIAGNOSIS — E119 Type 2 diabetes mellitus without complications: Secondary | ICD-10-CM

## 2011-01-07 DIAGNOSIS — M549 Dorsalgia, unspecified: Secondary | ICD-10-CM

## 2011-01-07 DIAGNOSIS — Z0000 Encounter for general adult medical examination without abnormal findings: Secondary | ICD-10-CM

## 2011-01-07 DIAGNOSIS — R109 Unspecified abdominal pain: Secondary | ICD-10-CM

## 2011-01-07 LAB — HEPATIC FUNCTION PANEL
ALT: 35 U/L (ref 0–35)
AST: 33 U/L (ref 0–37)
Albumin: 3.3 g/dL — ABNORMAL LOW (ref 3.5–5.2)
Alkaline Phosphatase: 118 U/L — ABNORMAL HIGH (ref 39–117)
Bilirubin, Direct: 0.1 mg/dL (ref 0.0–0.3)
Total Bilirubin: 0.5 mg/dL (ref 0.3–1.2)
Total Protein: 6.7 g/dL (ref 6.0–8.3)

## 2011-01-07 LAB — URINALYSIS, ROUTINE W REFLEX MICROSCOPIC
Bilirubin Urine: NEGATIVE
Hgb urine dipstick: NEGATIVE
Ketones, ur: NEGATIVE
Leukocytes, UA: NEGATIVE
Nitrite: NEGATIVE
Specific Gravity, Urine: 1.025 (ref 1.000–1.030)
Total Protein, Urine: NEGATIVE
Urine Glucose: NEGATIVE
Urobilinogen, UA: 0.2 (ref 0.0–1.0)
pH: 5.5 (ref 5.0–8.0)

## 2011-01-07 LAB — LIPID PANEL
Cholesterol: 262 mg/dL — ABNORMAL HIGH (ref 0–200)
HDL: 79.3 mg/dL (ref >39.00)
Total CHOL/HDL Ratio: 3
Triglycerides: 173 mg/dL — ABNORMAL HIGH (ref 0.0–149.0)
VLDL: 34.6 mg/dL (ref 0.0–40.0)

## 2011-01-07 LAB — BASIC METABOLIC PANEL
BUN: 19 mg/dL (ref 6–23)
CO2: 24 mEq/L (ref 19–32)
Calcium: 9.4 mg/dL (ref 8.4–10.5)
Chloride: 104 mEq/L (ref 96–112)
Creatinine, Ser: 1 mg/dL (ref 0.4–1.2)
GFR: 76.94 mL/min (ref >60.00)
Glucose, Bld: 116 mg/dL — ABNORMAL HIGH (ref 70–99)
Potassium: 4.5 mEq/L (ref 3.5–5.1)
Sodium: 135 mEq/L (ref 135–145)

## 2011-01-07 LAB — CBC WITH DIFFERENTIAL/PLATELET
Basophils Absolute: 0 10*3/uL (ref 0.0–0.1)
Basophils Relative: 0.2 % (ref 0.0–3.0)
Eosinophils Absolute: 0.4 10*3/uL (ref 0.0–0.7)
Eosinophils Relative: 5 % (ref 0.0–5.0)
HCT: 30.3 % — ABNORMAL LOW (ref 36.0–46.0)
Hemoglobin: 10.3 g/dL — ABNORMAL LOW (ref 12.0–15.0)
Lymphocytes Relative: 25.6 % (ref 12.0–46.0)
Lymphs Abs: 2.2 10*3/uL (ref 0.7–4.0)
MCHC: 33.9 g/dL (ref 30.0–36.0)
MCV: 92.6 fl (ref 78.0–100.0)
Monocytes Absolute: 0.4 10*3/uL (ref 0.1–1.0)
Monocytes Relative: 4.7 % (ref 3.0–12.0)
Neutro Abs: 5.4 10*3/uL (ref 1.4–7.7)
Neutrophils Relative %: 64.5 % (ref 43.0–77.0)
Platelets: 299 10*3/uL (ref 150.0–400.0)
RBC: 3.28 Mil/uL — ABNORMAL LOW (ref 3.87–5.11)
RDW: 14.5 % (ref 11.5–14.6)
WBC: 8.4 10*3/uL (ref 4.5–10.5)

## 2011-01-07 LAB — LDL CHOLESTEROL, DIRECT: Direct LDL: 156.6 mg/dL

## 2011-01-07 LAB — HEMOGLOBIN A1C: Hgb A1c MFr Bld: 6.4 % (ref 4.6–6.5)

## 2011-01-07 MED ORDER — OXYCODONE-ACETAMINOPHEN 5-325 MG PO TABS: 1.0000 | ORAL_TABLET | Freq: Four times a day (QID) | ORAL | Status: DC | PRN

## 2011-01-07 NOTE — Assessment & Plan Note (Signed)
Most recent hgb 8.8 per echart;  For f/u today  Lab Results  Component Value Date   HGB 8.8* 12/11/2010

## 2011-01-07 NOTE — Progress Notes (Signed)
Subjective:    Patient ID: [name], female    DOB: 21-Jan-1951, 60 y.o.   MRN: [id number]  HPI   Here to f/u;  Was just hospd apr 3-6 with infectious colitis, and GI bleed, with d/c hgb 8.8, s/p CT abd and seen per GI - Dr Deatra Ina while hospd. Last colonoscopy dec 2008 with diverticulosis, hemorrhoids.  Still with intermittent small volume BRBPR but no fever, rectal pain, n/v, other abd pain except for what she describes as Left lower back pain that radiaties to the left side and lower quad.  .  Also sees urology, getting PTNS every 3 wks, helps some but very little for difficult to treat  Urinary incontinence.  Otherwise doing ok,  Except for also left lower quad pain which sems to radaiate to the left flank area , off and on for over a yr for which she has also seem urology.  Pt denies chest pain, increased sob or doe, wheezing, orthopnea, PND, increased LE swelling, palpitations, dizziness or syncope.  Pt denies new neurological symptoms such as new headache, or facial or extremity weakness or numbness    Pt denies polydipsia, polyuria, or low sugar symptoms such as weakness or confusion improved with po intake.  Pt states overall good compliance with meds, trying to follow lower cholesterol, diabetic diet, wt overall stable but little exercise however.  CBG somewhat variable, sometimes up to high 20, usually in the 100 range.  Pt continues to have recurring LBP without change in severity, bowel or bladder change, fever, wt loss,  worsening LE pain/numbness/weakness, gait change or falls, with some radiation to her it seems to the left lower quadrant.  No other overt bleeding or bruising.  Past Medical History  Diagnosis Date  . DIABETES MELLITUS, TYPE II 05/06/2007  . HYPERLIPIDEMIA 05/06/2007  . ANEMIA-NOS 08/13/2007  . ANXIETY 05/06/2007  . INSOMNIA-SLEEP DISORDER-UNSPEC 09/01/2007  . DEPRESSION 09/01/2007  . COMMON MIGRAINE 08/13/2007  . HYPERTENSION 05/06/2007  . CORONARY ARTERY DISEASE  05/06/2007  . GERD 04/26/2009  . ULCER-GASTRIC 03/15/2009  . Gastroparesis 04/26/2009  . DIVERTICULOSIS-COLON 03/15/2009  . CHOLELITHIASIS 05/04/2009  . 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 MENOPAUSAL DISORDER 06/29/2008  . JOINT EFFUSION, LEFT KNEE 10/12/2007  . Pain in joint, shoulder region 10/12/2007  . CERVICAL RADICULOPATHY, LEFT 10/28/2008  . NAUSEA AND VOMITING 03/15/2009  . NAUSEA 03/15/2009  . Heartburn 03/15/2009  . CHANGE IN BOWELS 09/13/2009  . Dysuria 10/19/2009  . Abdominal pain, left lower quadrant 09/01/2007  . Abdominal pain, epigastric 03/15/2009  . Abdominal pain, generalized 12/21/2007  . ABDOMINAL PAIN OTHER SPECIFIED SITE 09/13/2009  . POSITIVE PPD 08/13/2007  . LACERATION 10/12/2007  . CONTUSIONS, MULTIPLE 10/12/2007  . KNEE REPLACEMENT, LEFT, HX OF 08/13/2007   Past Surgical History  Procedure Date  . Left knee replacement   . 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 Berenice Primas  . 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 Rotator cuff repair 01/2009  . Cholecystectomy 06/2009  . S/p bladder surgury 09/2009    Dr. Terance Hart    reports that she has quit smoking. She does not have any smokeless tobacco history on file. She reports that she does not drink alcohol or use illicit drugs. family history includes Coronary artery disease in her other; Diabetes in her brother and sister; Heart disease in her brother and sister; and Hypertension in her other. Allergies  Allergen Reactions  . Hydrocodone  REACTION: tachycardia  . Penicillins     Current Outpatient Prescriptions on File Prior to Visit  Medication Sig Dispense Refill  . amLODipine-valsartan (EXFORGE) 10-320 MG per tablet Take 1 tablet by mouth daily.        Marland Kitchen aspirin 81 MG EC tablet Take 81 mg by mouth daily.        Marland Kitchen estradiol (ESTRACE) 1 MG tablet Take 1 mg by mouth daily.        . insulin detemir (LEVEMIR FLEXPEN) 100 UNIT/ML injection Inject 10 Units into the skin daily.         . isosorbide dinitrate (ISORDIL) 10 MG tablet Take 10 mg by mouth 2 (two) times daily.        . metFORMIN (GLUMETZA) 500 MG (MOD) 24 hr tablet Take 500 mg by mouth 2 (two) times daily.        . metoprolol (LOPRESSOR) 50 MG tablet Take 50 mg by mouth 2 (two) times daily.        Marland Kitchen omeprazole (PRILOSEC) 40 MG capsule Take 40 mg by mouth daily.        Marland Kitchen oxyCODONE-acetaminophen (PERCOCET) 5-325 MG per tablet Take 1 tablet by mouth. Take 1-2 tablets every 6 hours as needed       . promethazine (PHENERGAN) 25 MG tablet Take 25 mg by mouth every 6 (six) hours as needed. For nausea       . sertraline (ZOLOFT) 50 MG tablet Take 50 mg by mouth daily.        . simvastatin (ZOCOR) 20 MG tablet Take 20 mg by mouth daily.        . sitaGLIPtan (JANUVIA) 100 MG tablet Take 100 mg by mouth daily.        . sucralfate (CARAFATE) 1 GM/10ML suspension Take 1 g by mouth. 1 gram three times a day before meals          Review of Systems Review of Systems  Constitutional: Negative for diaphoresis, activity change, appetite change and unexpected weight change.  HENT: Negative for hearing loss, ear pain, facial swelling, mouth sores and neck stiffness.   Eyes: Negative for pain, redness and visual disturbance.  Respiratory: Negative for shortness of breath and wheezing.   Cardiovascular: Negative for chest pain and palpitations.  Gastrointestinal: Negative for diarrhea, blood in stool, abdominal distention and rectal pain.  Genitourinary: Negative for hematuria, flank pain and decreased urine volume.  Musculoskeletal: Negative for myalgias and joint swelling.  Skin: Negative for color change and wound.  Neurological: Negative for syncope and numbness.  Hematological: Negative for adenopathy.  Psychiatric/Behavioral: Negative for hallucinations, self-injury, decreased concentration and agitation.      Objective:   Physical Exam Physical Exam  VS noted Constitutional: Pt appears well-developed and  well-nourished.  HENT: Head: Normocephalic.  Right Ear: External ear normal.  Left Ear: External ear normal.  Eyes: Conjunctivae and EOM are normal. Pupils are equal, round, and reactive to light.  Neck: Normal range of motion. Neck supple.  Cardiovascular: Normal rate and regular rhythm.   Pulmonary/Chest: Effort normal and breath sounds normal.  Abd:  Soft, NT, non-distended, + BS except for mild LLQ tender without guarding or rebound.   Neurological: Pt is alert. No cranial nerve deficit.  Motor - 5/5 throughout Skin: Skin is warm. No erythema.  Psychiatric: Pt behavior is normal. Thought content normal.  Spine nontender, but has left lumbar paravertebral tender without swelling or rash,         Assessment &  Plan:

## 2011-01-07 NOTE — Assessment & Plan Note (Signed)
LLQ, mild, with recent presumed infectious colitiss, currently afeb, to check WBC as above, refer GI , also bmp, UA, lft's

## 2011-01-07 NOTE — Assessment & Plan Note (Signed)
stable overall by hx and exam, most recent lab reviewed with pt, and pt to continue medical treatment as before . For labs today  Lab Results  Component Value Date   HGBA1C 6.8* 02/13/2009

## 2011-01-07 NOTE — Patient Instructions (Signed)
Continue all other medications as before Please go to LAB in the Basement for the blood and/or urine tests to be done today Please call the number on the [facility] (the [place]) for results of testing in 2-3 days You will be contacted regarding the referral for: GI - Dr Fuller Plan Please return in July 2012 with Lab testing done 3-5 days before

## 2011-01-07 NOTE — Assessment & Plan Note (Signed)
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

## 2011-01-07 NOTE — Assessment & Plan Note (Signed)
Persistent small volume, to check cbc today, refer to Dr Fuller Plan (her primary GI) - ? Need f/u colonoscopy

## 2011-01-08 ENCOUNTER — Other Ambulatory Visit: Payer: Self-pay

## 2011-01-08 LAB — DIFFERENTIAL
Basophils Absolute: 0.1 10*3/uL (ref 0.0–0.1)
Basophils Relative: 1 % (ref 0–1)
Eosinophils Absolute: 0.3 10*3/uL (ref 0.0–0.7)
Eosinophils Relative: 4 % (ref 0–5)
Lymphocytes Relative: 36 % (ref 12–46)
Lymphs Abs: 2.7 10*3/uL (ref 0.7–4.0)
Monocytes Absolute: 0.5 10*3/uL (ref 0.1–1.0)
Monocytes Relative: 7 % (ref 3–12)
Neutro Abs: 3.9 10*3/uL (ref 1.7–7.7)
Neutrophils Relative %: 53 % (ref 43–77)

## 2011-01-08 LAB — BASIC METABOLIC PANEL
BUN: 15 mg/dL (ref 6–23)
CO2: 23 mEq/L (ref 19–32)
Calcium: 10.1 mg/dL (ref 8.4–10.5)
Chloride: 107 mEq/L (ref 96–112)
Creatinine, Ser: 0.88 mg/dL (ref 0.4–1.2)
GFR calc Af Amer: >60 mL/min (ref >60)
GFR calc non Af Amer: >60 mL/min (ref >60)
Glucose, Bld: 150 mg/dL — ABNORMAL HIGH (ref 70–99)
Potassium: 3.8 mEq/L (ref 3.5–5.1)
Sodium: 139 mEq/L (ref 135–145)

## 2011-01-08 LAB — URINALYSIS, ROUTINE W REFLEX MICROSCOPIC
Glucose, UA: NEGATIVE mg/dL
Hgb urine dipstick: NEGATIVE
Ketones, ur: NEGATIVE mg/dL
Nitrite: NEGATIVE
Protein, ur: NEGATIVE mg/dL
Specific Gravity, Urine: 1.029 (ref 1.005–1.030)
Urobilinogen, UA: 0.2 mg/dL (ref 0.0–1.0)
pH: 6 (ref 5.0–8.0)

## 2011-01-08 LAB — CBC
HCT: 33.7 % — ABNORMAL LOW (ref 36.0–46.0)
Hemoglobin: 11.4 g/dL — ABNORMAL LOW (ref 12.0–15.0)
MCHC: 33.9 g/dL (ref 30.0–36.0)
MCV: 95.2 fL (ref 78.0–100.0)
Platelets: 248 10*3/uL (ref 150–400)
RBC: 3.53 MIL/uL — ABNORMAL LOW (ref 3.87–5.11)
RDW: 13.4 % (ref 11.5–15.5)
WBC: 7.4 10*3/uL (ref 4.0–10.5)

## 2011-01-08 MED ORDER — ATORVASTATIN CALCIUM 20 MG PO TABS: 20.0000 mg | ORAL_TABLET | Freq: Every day | ORAL | Status: DC

## 2011-01-09 LAB — URINE CULTURE: Colony Count: 50000

## 2011-01-11 LAB — GLUCOSE, CAPILLARY
Glucose-Capillary: 127 mg/dL — ABNORMAL HIGH (ref 70–99)
Glucose-Capillary: 139 mg/dL — ABNORMAL HIGH (ref 70–99)
Glucose-Capillary: 153 mg/dL — ABNORMAL HIGH (ref 70–99)
Glucose-Capillary: 164 mg/dL — ABNORMAL HIGH (ref 70–99)
Glucose-Capillary: 224 mg/dL — ABNORMAL HIGH (ref 70–99)

## 2011-01-11 LAB — BASIC METABOLIC PANEL
BUN: 17 mg/dL (ref 6–23)
CO2: 22 mEq/L (ref 19–32)
Calcium: 9.4 mg/dL (ref 8.4–10.5)
Chloride: 105 mEq/L (ref 96–112)
Creatinine, Ser: 1.26 mg/dL — ABNORMAL HIGH (ref 0.4–1.2)
GFR calc Af Amer: 53 mL/min — ABNORMAL LOW (ref >60)
GFR calc non Af Amer: 43 mL/min — ABNORMAL LOW (ref >60)
Glucose, Bld: 162 mg/dL — ABNORMAL HIGH (ref 70–99)
Potassium: 4.8 mEq/L (ref 3.5–5.1)
Sodium: 137 mEq/L (ref 135–145)

## 2011-01-11 LAB — URINALYSIS, ROUTINE W REFLEX MICROSCOPIC
Bilirubin Urine: NEGATIVE
Glucose, UA: NEGATIVE mg/dL
Hgb urine dipstick: NEGATIVE
Ketones, ur: NEGATIVE mg/dL
Nitrite: NEGATIVE
Protein, ur: NEGATIVE mg/dL
Specific Gravity, Urine: 1.006 (ref 1.005–1.030)
Urobilinogen, UA: 0.2 mg/dL (ref 0.0–1.0)
pH: 7 (ref 5.0–8.0)

## 2011-01-11 LAB — CBC
HCT: 34.5 % — ABNORMAL LOW (ref 36.0–46.0)
Hemoglobin: 11.6 g/dL — ABNORMAL LOW (ref 12.0–15.0)
MCHC: 33.7 g/dL (ref 30.0–36.0)
MCV: 95.3 fL (ref 78.0–100.0)
Platelets: 258 10*3/uL (ref 150–400)
RBC: 3.62 MIL/uL — ABNORMAL LOW (ref 3.87–5.11)
RDW: 12.8 % (ref 11.5–15.5)
WBC: 8.9 10*3/uL (ref 4.0–10.5)

## 2011-01-14 LAB — GLUCOSE, CAPILLARY
Glucose-Capillary: 174 mg/dL — ABNORMAL HIGH (ref 70–99)
Glucose-Capillary: 184 mg/dL — ABNORMAL HIGH (ref 70–99)

## 2011-01-16 ENCOUNTER — Other Ambulatory Visit: Payer: Self-pay | Admitting: Internal Medicine

## 2011-01-23 ENCOUNTER — Encounter: Payer: Self-pay | Admitting: Gastroenterology

## 2011-01-23 ENCOUNTER — Ambulatory Visit (INDEPENDENT_AMBULATORY_CARE_PROVIDER_SITE_OTHER): Payer: BC Managed Care – PPO | Admitting: Gastroenterology

## 2011-01-23 VITALS — BP 134/78 | HR 62 | Ht 65.0 in | Wt 283.0 lb

## 2011-01-23 DIAGNOSIS — R197 Diarrhea, unspecified: Secondary | ICD-10-CM

## 2011-01-23 DIAGNOSIS — R933 Abnormal findings on diagnostic imaging of other parts of digestive tract: Secondary | ICD-10-CM

## 2011-01-23 MED ORDER — ALIGN PO CAPS: 1.0000 | ORAL_CAPSULE | Freq: Every day | ORAL | Status: DC

## 2011-01-23 MED ORDER — OMEPRAZOLE 40 MG PO CPDR: 40.0000 mg | DELAYED_RELEASE_CAPSULE | Freq: Every day | ORAL | Status: DC

## 2011-01-23 NOTE — Patient Instructions (Signed)
Please go to the basement for your labs.  Start Align one tablet by mouth daily x 1 month.  Follow instructions on Hemoccult cards and mail back to [place] when finished.  Your prescription has been sent to your pharmacy.  Cc: Cathlean Cower, MD

## 2011-01-23 NOTE — Progress Notes (Signed)
History of Present Illness: This is a 60 year old female here today with her husband. She was hospitalized from March 3 to March 6 for lower abdominal pain and bloody diarrhea and felt to have an infectious colitis. She was treated with a seven-day course of Cipro and Flagyl. CT scan of the abdomen and pelvis on March 3 showed mucosal edema throughout the colon but particularly in the right colon suggestive of inflammatory process. She also had acute on chronic anemia anemia with recent GI bleeding contributing. She states that her diarrhea and abdominal pain have improved substantially but not abated. She still has intermittent episodes of loose dark stools without bright red blood. She notes mild left lower bowel cramping. The symptoms occur intermittently and on other days she has no gastrointestinal complaints. She underwent colonoscopy in December 2008 showing diverticulosis and hemorrhoids.  Current Medications, Allergies, Past Medical History, Past Surgical History, Family History and Social History were reviewed in [employer] record.  Physical Exam: General: Well developed , well nourished, no acute distress. Obese. Head: Normocephalic and atraumatic Eyes:  sclerae anicteric, EOMI Ears: Normal auditory acuity Mouth: No deformity or lesions Lungs: Clear throughout to auscultation Heart: Regular rate and rhythm; no murmurs, rubs or bruits Abdomen: Soft, minimal left lower quadrant  tenderness, without rebound or guarding, and non distended. No masses, hepatosplenomegaly or hernias noted. Normal Bowel sounds Musculoskeletal: Symmetrical with no gross deformities  Pulses:  Normal pulses noted Extremities: No clubbing, cyanosis, edema or deformities noted Neurological: Alert oriented x 4, grossly nonfocal Psychological:  Alert and cooperative. Normal mood and affect  Assessment and Recommendations:  1. Presumed recent episode of infectious colitis. Cannot exclude  ischemic colitis. Inflammatory bowel disease is less likely. She may have a post infectious diarrhea. Obtain stool studies and stool Hemoccults. Begin a daily probiotic for one month. Minimize lactose and caffeine products. Return office visit in one month.

## 2011-01-28 ENCOUNTER — Other Ambulatory Visit: Payer: Self-pay | Admitting: Gastroenterology

## 2011-01-28 ENCOUNTER — Other Ambulatory Visit: Payer: BC Managed Care – PPO

## 2011-01-28 DIAGNOSIS — R197 Diarrhea, unspecified: Secondary | ICD-10-CM

## 2011-01-29 LAB — GIARDIA/CRYPTOSPORIDIUM (EIA)
Cryptosporidium Screen (EIA): NEGATIVE
Giardia Screen (EIA): NEGATIVE

## 2011-01-29 LAB — CLOSTRIDIUM DIFFICILE BY PCR: Toxigenic C. Difficile by PCR: NOT DETECTED

## 2011-01-29 LAB — FECAL FAT QUALITATIVE
Free Fatty Acids: INCREASED
NEUTRAL FAT: INCREASED

## 2011-01-29 LAB — FECAL LACTOFERRIN, QUANT: Lactoferrin: POSITIVE

## 2011-01-30 ENCOUNTER — Telehealth: Payer: Self-pay | Admitting: Gastroenterology

## 2011-01-30 NOTE — Telephone Encounter (Signed)
She should see her PCP regarding her back pain

## 2011-01-30 NOTE — Telephone Encounter (Signed)
Patient is c/o very low back pain and this is radiating into her abdomen. Her pain is a intermittent very sharp pain that requires her to have to sit to relieve it.  She states it is not a cramping pain.  She can't correlate any activity to the pain.   She reports her stools are normal no further diarrhea.  She denies any rectal bleeding, fever, diarrhea or constipation, and no vomiting, she does report some nausea.

## 2011-01-30 NOTE — Telephone Encounter (Signed)
Patient advised of Dr. Stark's recommendations. 

## 2011-02-01 ENCOUNTER — Encounter: Payer: Self-pay | Admitting: Internal Medicine

## 2011-02-01 ENCOUNTER — Ambulatory Visit (INDEPENDENT_AMBULATORY_CARE_PROVIDER_SITE_OTHER): Payer: BC Managed Care – PPO | Admitting: Internal Medicine

## 2011-02-01 VITALS — BP 134/74 | HR 80 | Temp 98.7°F | Ht 66.0 in | Wt 285.5 lb

## 2011-02-01 DIAGNOSIS — M549 Dorsalgia, unspecified: Secondary | ICD-10-CM

## 2011-02-01 DIAGNOSIS — R1032 Left lower quadrant pain: Secondary | ICD-10-CM

## 2011-02-01 DIAGNOSIS — I10 Essential (primary) hypertension: Secondary | ICD-10-CM

## 2011-02-01 DIAGNOSIS — F329 Major depressive disorder, single episode, unspecified: Secondary | ICD-10-CM

## 2011-02-01 LAB — STOOL CULTURE

## 2011-02-01 MED ORDER — KETOROLAC TROMETHAMINE 30 MG/ML IJ SOLN
30.0000 mg | Freq: Once | INTRAMUSCULAR | Status: AC
  Administered 2011-02-01: 30 mg via INTRAMUSCULAR

## 2011-02-01 MED ORDER — METRONIDAZOLE 500 MG PO TABS: 500.0000 mg | ORAL_TABLET | Freq: Three times a day (TID) | ORAL | Status: AC

## 2011-02-01 MED ORDER — CYCLOBENZAPRINE HCL 5 MG PO TABS: 5.0000 mg | ORAL_TABLET | Freq: Three times a day (TID) | ORAL | Status: DC | PRN

## 2011-02-01 MED ORDER — OXYCODONE-ACETAMINOPHEN 5-325 MG PO TABS: 1.0000 | ORAL_TABLET | Freq: Four times a day (QID) | ORAL | Status: DC | PRN

## 2011-02-01 NOTE — Patient Instructions (Signed)
You had the toradol shot today for pain Take all new medications as prescribed - the antibiotic, and muscle relaxer (sent to the pharmacy), and pain medication (given hardcopy) Please keep your appointments with your specialists as you have planned

## 2011-02-01 NOTE — Assessment & Plan Note (Addendum)
?   Recurrent mild colitis - for repeat flagyl course, consider refer back to Dr Fuller Plan  - ? Flex sig with biopsy,  to f/u any worsening symptoms or concerns

## 2011-02-01 NOTE — Progress Notes (Signed)
Subjective:    Patient ID: [name], female    DOB: Feb 07, 1951, 60 y.o.   MRN: [id number]  HPI  Here to f/u; has complicated hx of lower abd pain for over a yr, being now tx with PTINS bladder therapy per urology, and also recent episode of colitis (c diff neg) with hospn early march, seemed to respond to cipro and flagyl for some time, now with 1-2 wks gradually increased LLQ pain without change in urinary freq,urgency (in fact saw Dr McDermott/renal earlier this wk, neg for UTI per pt), but has had some increase in loose stool, though not watery, no high fever/chills, other abd pain or n/v, wt loss, dysphagia.  No recent antibx after cipro/flagyl course.  Has seen Dr Fuller Plan for the previous colitis. Also this time c/o left lower back pain, unclear if related to the LLQ pain or bowel change but seems to be worse again in the past 1-2 wks;  But without other bowel or bladder change, fever, wt loss,  worsening LE pain/numbness/weakness, gait change or falls.  Worse to get up from chair, better to lie down, OTC med and hydrocodone not working.  Pt denies chest pain, increased sob or doe, wheezing, orthopnea, PND, increased LE swelling, palpitations, dizziness or syncope. Pt denies new neurological symptoms such as new headache, or facial or extremity weakness or numbness   Pt denies polydipsia, polyuria.  Denies worsening depressive symptoms, suicidal ideation, or panic, though has ongoing anxiety   Past Medical History  Diagnosis Date  . DIABETES MELLITUS, TYPE II 05/06/2007  . HYPERLIPIDEMIA 05/06/2007  . ANEMIA-NOS 08/13/2007  . ANXIETY 05/06/2007  . INSOMNIA-SLEEP DISORDER-UNSPEC 09/01/2007  . DEPRESSION 09/01/2007  . COMMON MIGRAINE 08/13/2007  . HYPERTENSION 05/06/2007  . CORONARY ARTERY DISEASE 05/06/2007  . GERD 04/26/2009  . ULCER-GASTRIC 03/15/2009  . Gastroparesis 04/26/2009  . DIVERTICULOSIS-COLON 03/15/2009  . CHOLELITHIASIS 05/04/2009  . 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 MENOPAUSAL  DISORDER 06/29/2008  . JOINT EFFUSION, LEFT KNEE 10/12/2007  . Pain in joint, shoulder region 10/12/2007  . CERVICAL RADICULOPATHY, LEFT 10/28/2008  . NAUSEA AND VOMITING 03/15/2009  . NAUSEA 03/15/2009  . Heartburn 03/15/2009  . CHANGE IN BOWELS 09/13/2009  . Dysuria 10/19/2009  . Abdominal pain, left lower quadrant 09/01/2007  . Abdominal pain, epigastric 03/15/2009  . Abdominal pain, generalized 12/21/2007  . ABDOMINAL PAIN OTHER SPECIFIED SITE 09/13/2009  . POSITIVE PPD 08/13/2007  . LACERATION 10/12/2007  . CONTUSIONS, MULTIPLE 10/12/2007  . KNEE REPLACEMENT, LEFT, HX OF 08/13/2007  . Infectious colitis   . Gastroparesis   . Gastric ulcer 04/2008  . Hemorrhoids   . Diverticulosis   . Hypertension   . Hyperlipidemia   . Diabetes mellitus, type II   . GERD (gastroesophageal reflux disease)   . Iron deficiency anemia   . Hiatal hernia   . CAD (coronary artery disease)   . Anxiety   . GI (gastrointestinal bleed)     Hx of    Past Surgical History  Procedure Date  . Left knee replacement   . 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 Berenice Primas  . 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 Rotator cuff repair 01/2009  . Cholecystectomy 06/2009  . S/p bladder surgury 09/2009    Dr. Terance Hart  . Abdominal hysterectomy     reports that she has quit smoking. She has never used smokeless tobacco. She reports that she does not drink alcohol  or use illicit drugs. family history includes Coronary artery disease in her other; Diabetes in her brother and sister; Heart disease in her brother and sister; and Hypertension in her other.  There is no history of Colon cancer. Allergies  Allergen Reactions  . Hydrocodone     REACTION: tachycardia  . Penicillins    Current Outpatient Prescriptions on File Prior to Visit  Medication Sig Dispense Refill  . amLODipine-valsartan (EXFORGE) 10-320 MG per tablet Take 1 tablet by mouth daily.        Marland Kitchen aspirin 81 MG EC  tablet Take 81 mg by mouth daily.        Marland Kitchen atorvastatin (LIPITOR) 20 MG tablet Take 1 tablet (20 mg total) by mouth daily.  90 tablet  3  . bifidobacterium infantis (ALIGN) capsule Take 1 capsule by mouth daily.  30 capsule  11  . estradiol (ESTRACE) 1 MG tablet Take 1 mg by mouth daily.        . furosemide (LASIX) 40 MG tablet Take 40 mg by mouth daily.        . insulin detemir (LEVEMIR FLEXPEN) 100 UNIT/ML injection Inject 10 Units into the skin daily.        . isosorbide dinitrate (ISORDIL) 10 MG tablet Take 10 mg by mouth 2 (two) times daily.        . metFORMIN (GLUMETZA) 500 MG (MOD) 24 hr tablet Take 500 mg by mouth 2 (two) times daily.        . metoprolol (LOPRESSOR) 50 MG tablet Take 50 mg by mouth 2 (two) times daily.        Marland Kitchen omeprazole (PRILOSEC) 40 MG capsule Take 1 capsule (40 mg total) by mouth daily.  30 capsule  11  . promethazine (PHENERGAN) 25 MG tablet Take 25 mg by mouth every 6 (six) hours as needed. For nausea       . sertraline (ZOLOFT) 50 MG tablet Take 50 mg by mouth daily.        . simvastatin (ZOCOR) 20 MG tablet Take 20 mg by mouth daily.        . sitaGLIPtan (JANUVIA) 100 MG tablet Take 100 mg by mouth daily.        . sucralfate (CARAFATE) 1 GM/10ML suspension Take 1 g by mouth. 1 gram three times a day before meals       . metFORMIN (GLUCOPHAGE-XR) 500 MG 24 hr tablet TAKE ONE TABLET BY MOUTH TWICE DAILY WITH MEALS  180 tablet  0   No current facility-administered medications on file prior to visit.   Review of Systems Review of Systems  Constitutional: Negative for diaphoresis and unexpected weight change.  HENT: Negative for drooling and tinnitus.   Eyes: Negative for photophobia and visual disturbance.  Respiratory: Negative for choking and stridor.   Gastrointestinal: Negative for vomiting and blood in stool.  Genitourinary: Negative for hematuria and decreased urine volume.  Musculoskeletal: Negative for gait problem.  Skin: Negative for color change and  wound.  Neurological: Negative for tremors and numbness.  Psychiatric/Behavioral: Negative for decreased concentration. The patient is not hyperactive.       Objective:   Physical Exam BP 134/74  Pulse 80  Temp(Src) 98.7 F (37.1 C) (Oral)  Ht 5\' 6"  (1.676 m)  Wt 285 lb 8 oz (129.502 kg)  BMI 46.08 kg/m2  SpO2 92% Physical Exam  VS noted Constitutional: Pt appears well-developed and well-nourished.  HENT: Head: Normocephalic.  Right Ear: External ear normal.  Left  Ear: External ear normal.  Eyes: Conjunctivae and EOM are normal. Pupils are equal, round, and reactive to light.  Neck: Normal range of motion. Neck supple.  Cardiovascular: Normal rate and regular rhythm.   Pulmonary/Chest: Effort normal and breath sounds normal.  Abd:  Soft, NT, non-distended, + BS except for mild to mod LLQ tender with deeper palpation, no guarding or rebound Neurological: Pt is alert. No cranial nerve deficit.  Motor/sens/dtr neg, slr neg bilat Skin: Skin is warm. No erythema.  Psychiatric: Pt behavior is normal. Thought content normal.  Dysphoric affect, 1+ nervous Left paravertebral and flank area with marked spasm and tender, none on the right       Assessment & Plan:

## 2011-02-01 NOTE — Assessment & Plan Note (Addendum)
Today with marked spasm and tender left lumbar paravertebral area  - c/w msk strain, for toradol IM now, then pain control prn, and flexeril prn,  to f/u any worsening symptoms or concerns

## 2011-02-02 ENCOUNTER — Encounter: Payer: Self-pay | Admitting: Internal Medicine

## 2011-02-02 MED ORDER — PROMETHAZINE HCL 25 MG PO TABS: 25.0000 mg | ORAL_TABLET | Freq: Four times a day (QID) | ORAL | Status: DC | PRN

## 2011-02-02 NOTE — Assessment & Plan Note (Signed)
stable overall by hx and exam, most recent lab reviewed with pt, and pt to continue medical treatment as before

## 2011-02-02 NOTE — Assessment & Plan Note (Signed)
stable overall by hx and exam, most recent lab reviewed with pt, and pt to continue medical treatment as before  BP Readings from Last 3 Encounters:  02/01/11 134/74  01/23/11 134/78  01/07/11 130/68

## 2011-02-04 ENCOUNTER — Encounter: Payer: Self-pay | Admitting: Internal Medicine

## 2011-02-04 ENCOUNTER — Ambulatory Visit (INDEPENDENT_AMBULATORY_CARE_PROVIDER_SITE_OTHER): Payer: BC Managed Care – PPO | Admitting: Internal Medicine

## 2011-02-04 VITALS — BP 120/80 | HR 71 | Temp 98.5°F | Ht 66.0 in | Wt 290.5 lb

## 2011-02-04 DIAGNOSIS — R42 Dizziness and giddiness: Secondary | ICD-10-CM | POA: Insufficient documentation

## 2011-02-04 DIAGNOSIS — H6690 Otitis media, unspecified, unspecified ear: Secondary | ICD-10-CM

## 2011-02-04 DIAGNOSIS — M549 Dorsalgia, unspecified: Secondary | ICD-10-CM

## 2011-02-04 DIAGNOSIS — R1032 Left lower quadrant pain: Secondary | ICD-10-CM

## 2011-02-04 DIAGNOSIS — H6692 Otitis media, unspecified, left ear: Secondary | ICD-10-CM

## 2011-02-04 MED ORDER — METHOCARBAMOL 500 MG PO TABS: 500.0000 mg | ORAL_TABLET | Freq: Four times a day (QID) | ORAL | Status: AC

## 2011-02-04 MED ORDER — OXYCODONE-ACETAMINOPHEN 5-325 MG PO TABS: 1.0000 | ORAL_TABLET | Freq: Four times a day (QID) | ORAL | Status: DC | PRN

## 2011-02-04 MED ORDER — MECLIZINE HCL 12.5 MG PO TABS: 12.5000 mg | ORAL_TABLET | Freq: Three times a day (TID) | ORAL | Status: DC | PRN

## 2011-02-04 MED ORDER — AZITHROMYCIN 250 MG PO TABS: ORAL_TABLET | ORAL | Status: AC

## 2011-02-04 NOTE — Assessment & Plan Note (Signed)
Left lumbar improved with pain and spasm, but now left trapezoid with marked spasm , not improved with the flexeril - to change to robaxin bid prn, refill the percocet prn, f/u any worsening symptoms

## 2011-02-04 NOTE — Assessment & Plan Note (Addendum)
Improved, to cont the flagyl as prescribed,  to f/u any worsening symptoms or concerns

## 2011-02-04 NOTE — Progress Notes (Signed)
Subjective:    Patient ID: [name], female    DOB: 15-Apr-1951, 60 y.o.   MRN: [id number]  HPI  Here to f/u, with LLQ pain/tender and left lumbar area pain improved without further loose stools, fever, abd pain, n/v.  Unfortunately over the weekend has developed new onset 2-3 days left trapezoid tender and spasm, such that current meds not effective, including the new flexeril.  Has some ongoing left shoudler stiffness and decreased ROM as well, has already had right shoulder surgury and is not anxious for left shoulder evaluation.  Also with new onset worsening left ear pain, feverish, and vertigo with head turning.  No HA, ST, cough and Pt denies chest pain, increased sob or doe, wheezing, orthopnea, PND, increased LE swelling, palpitations, dizziness or syncope.  Pt denies new neurological symptoms such as new headache, or facial or extremity weakness or numbness   Pt denies polydipsia, polyuria.  Past Medical History  Diagnosis Date  . DIABETES MELLITUS, TYPE II 05/06/2007  . HYPERLIPIDEMIA 05/06/2007  . ANEMIA-NOS 08/13/2007  . ANXIETY 05/06/2007  . INSOMNIA-SLEEP DISORDER-UNSPEC 09/01/2007  . DEPRESSION 09/01/2007  . COMMON MIGRAINE 08/13/2007  . HYPERTENSION 05/06/2007  . CORONARY ARTERY DISEASE 05/06/2007  . GERD 04/26/2009  . ULCER-GASTRIC 03/15/2009  . Gastroparesis 04/26/2009  . DIVERTICULOSIS-COLON 03/15/2009  . CHOLELITHIASIS 05/04/2009  . 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 MENOPAUSAL DISORDER 06/29/2008  . JOINT EFFUSION, LEFT KNEE 10/12/2007  . Pain in joint, shoulder region 10/12/2007  . CERVICAL RADICULOPATHY, LEFT 10/28/2008  . NAUSEA AND VOMITING 03/15/2009  . NAUSEA 03/15/2009  . Heartburn 03/15/2009  . CHANGE IN BOWELS 09/13/2009  . Dysuria 10/19/2009  . Abdominal pain, left lower quadrant 09/01/2007  . Abdominal pain, epigastric 03/15/2009  . Abdominal pain, generalized 12/21/2007  . ABDOMINAL PAIN OTHER SPECIFIED SITE 09/13/2009  . POSITIVE PPD 08/13/2007  . LACERATION 10/12/2007    . CONTUSIONS, MULTIPLE 10/12/2007  . KNEE REPLACEMENT, LEFT, HX OF 08/13/2007  . Infectious colitis   . Gastroparesis   . Gastric ulcer 04/2008  . Hemorrhoids   . Diverticulosis   . Hypertension   . Hyperlipidemia   . Diabetes mellitus, type II   . GERD (gastroesophageal reflux disease)   . Iron deficiency anemia   . Hiatal hernia   . CAD (coronary artery disease)   . Anxiety   . GI (gastrointestinal bleed)     Hx of    Past Surgical History  Procedure Date  . Left knee replacement   . 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 Berenice Primas  . 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 Rotator cuff repair 01/2009  . Cholecystectomy 06/2009  . S/p bladder surgury 09/2009    Dr. Terance Hart  . Abdominal hysterectomy     reports that she has quit smoking. She has never used smokeless tobacco. She reports that she does not drink alcohol or use illicit drugs. family history includes Coronary artery disease in her other; Diabetes in her brother and sister; Heart disease in her brother and sister; and Hypertension in her other.  There is no history of Colon cancer. Allergies  Allergen Reactions  . Hydrocodone     REACTION: tachycardia  . Penicillins    Current Outpatient Prescriptions on File Prior to Visit  Medication Sig Dispense Refill  . amLODipine-valsartan (EXFORGE) 10-320 MG per tablet Take 1 tablet by mouth daily.        Marland Kitchen aspirin 81  MG EC tablet Take 81 mg by mouth daily.        Marland Kitchen atorvastatin (LIPITOR) 20 MG tablet Take 1 tablet (20 mg total) by mouth daily.  90 tablet  3  . bifidobacterium infantis (ALIGN) capsule Take 1 capsule by mouth daily.  30 capsule  11  . estradiol (ESTRACE) 1 MG tablet Take 1 mg by mouth daily.        . furosemide (LASIX) 40 MG tablet Take 40 mg by mouth daily.        . insulin detemir (LEVEMIR FLEXPEN) 100 UNIT/ML injection Inject 10 Units into the skin daily.        . isosorbide dinitrate (ISORDIL) 10 MG  tablet Take 10 mg by mouth 2 (two) times daily.        . metFORMIN (GLUCOPHAGE-XR) 500 MG 24 hr tablet TAKE ONE TABLET BY MOUTH TWICE DAILY WITH MEALS  180 tablet  0  . metoprolol (LOPRESSOR) 50 MG tablet Take 50 mg by mouth 2 (two) times daily.        . metroNIDAZOLE (FLAGYL) 500 MG tablet Take 1 tablet (500 mg total) by mouth 3 (three) times daily.  30 tablet  0  . omeprazole (PRILOSEC) 40 MG capsule Take 1 capsule (40 mg total) by mouth daily.  30 capsule  11  . promethazine (PHENERGAN) 25 MG tablet Take 1 tablet (25 mg total) by mouth every 6 (six) hours as needed. For nausea  30 tablet  2  . sertraline (ZOLOFT) 50 MG tablet Take 50 mg by mouth daily.        . simvastatin (ZOCOR) 20 MG tablet Take 20 mg by mouth daily.        . sitaGLIPtan (JANUVIA) 100 MG tablet Take 100 mg by mouth daily.        . sucralfate (CARAFATE) 1 GM/10ML suspension Take 1 g by mouth. 1 gram three times a day before meals       . DISCONTD: cyclobenzaprine (FLEXERIL) 5 MG tablet Take 1 tablet (5 mg total) by mouth 3 (three) times daily as needed for muscle spasms.  60 tablet  1  . DISCONTD: oxyCODONE-acetaminophen (PERCOCET) 5-325 MG per tablet Take 1 tablet by mouth every 6 (six) hours as needed for pain.  60 tablet  0  . metFORMIN (GLUMETZA) 500 MG (MOD) 24 hr tablet Take 500 mg by mouth 2 (two) times daily.         Review of Systems All otherwise neg per pt     Objective:   Physical Exam BP 120/80  Pulse 71  Temp(Src) 98.5 F (36.9 C) (Oral)  Ht 5\' 6"  (1.676 m)  Wt 290 lb 8 oz (131.77 kg)  BMI 46.89 kg/m2  SpO2 99% Physical Exam  VS noted Constitutional: Pt appears well-developed and well-nourished.  HENT: Head: Normocephalic.  Right Ear: External ear normal. Left TM severe erythema, mild bulging Left Ear: External ear normal.  RIght TM normal appearance, nonbulging Eyes: Conjunctivae and EOM are normal. Pupils are equal, round, and reactive to light.  Neck: Normal range of motion. Neck supple.    Cardiovascular: Normal rate and regular rhythm.   Pulmonary/Chest: Effort normal and breath sounds normal.  Abd:  Soft, NT, non-distended, + BS Neurological: Pt is alert. No cranial nerve deficit. motor intact throughout Skin: Skin is warm. No erythema.  Psychiatric: Pt behavior is normal. Thought content normal. 1+ nervous Left trapezoid moderate tender, spasm Left shoulder mild diffuse tender and decreased ROM by 20  degrees to abduction       Assessment & Plan:

## 2011-02-04 NOTE — Patient Instructions (Addendum)
Stop the flexeril (cyclobenzaprine) Start the robaxin generic twice per day as needed for spasms Take all new medications as prescribed - the percocet refill, and the antibiotic for the left ear, and the meclizine as needed for the dizziness Continue all other medications as before

## 2011-02-04 NOTE — Assessment & Plan Note (Signed)
Mild, likely due to left inner ear today, for meclizine prn

## 2011-02-04 NOTE — Assessment & Plan Note (Signed)
Mild, for antibx course, and  to f/u any worsening symptoms or concerns

## 2011-02-19 NOTE — Discharge Summary (Signed)
[name], [name]               ACCOUNT NO.:  [id number]   MEDICAL RECORD NO.:  [id number]          PATIENT TYPE:  INP   LOCATION:  5029                         FACILITY:  [facility]   PHYSICIAN:  Alta Corning, M.D.   DATE OF BIRTH:  02-21-1951   DATE OF ADMISSION:  04/25/2008  DATE OF DISCHARGE:  04/29/2008                               DISCHARGE SUMMARY   ADMITTING DIAGNOSES:  1. Painful right knee, status post right total knee arthroplasty done      9 years ago, rule out prosthetic loosening.  2. Morbid obesity.  3. Chronic pain.  4. Type 2 diabetes.  5. Hypertension.  6. Gastroesophageal reflux disease.  7. Coronary artery disease.   DISCHARGE DIAGNOSES:  1. Painful right knee, status post right total knee arthroplasty done      9 years ago, rule out prosthetic loosening.  2. Morbid obesity.  3. Chronic pain.  4. Type 2 diabetes.  5. Hypertension.  6. Gastroesophageal reflux disease.  7. Coronary artery disease.  8. Acute blood loss anemia requiring transfusion.   PROCEDURES IN THE HOSPITAL:  Revision arthroplasty, right knee with  extensive synovectomy and replacement of polyethylene tibial component,  Sherry Leitz, MD, April 25, 2008.   BRIEF HISTORY:  [name] is a 60 year old patient who is well known  to [place].  She has a history of right total knee arthroplasty done in the  year 2000 by another orthopedist in town.  She has had pain in her right  knee for well over a year.  X-rays have looked good.  Bone scan was  negative and aspirate of the right knee was negative.  She had  persistent pain in her right knee despite modification in activity and  use of medication and attempts at weight loss.  She complained of pain  in the right knee with weightbearing and at rest.  We have done a left  total knee replacement on her a couple of years ago.  She was very well  and she was extremely concerned about the persistent pain in her right  knee and she was admitted to  the hospital on this visit for right total  knee revision and exploration of the right knee to rule out prosthetic  loosening or other reasons for her pain.  Exhaustive course of over 1  year treatment was done nonoperatively prior to this admission.  Anyway,  because of persistent knee pain, she was admitted for right knee  surgery.   PERTINENT LABORATORY STUDIES:  EKG on April 25, 2008, showed normal sinus  rhythm, cannot rule out anterior infarct, age undetermined.  Hemoglobin  on admission was 11.1.  On postop day #1, her hemoglobin was 7.6; postop  day #2, 9.6; postop #3, 9.5.  Protime on admission was 14.9 seconds with  an INR of 1.1, and PTT of 33.  On the day of discharge, her INR was 3.0  on Coumadin per pharmacy protocol.  Her CMET on admission showed no  abnormalities other than slightly elevated glucose of 132.  BMET was  followed 3 subsequent days and it  showed a potassium to be in good range  ranging from 4.0 to 4.5 and only finding was a decreased sodium of 133  on postop day #1.  Urinalysis on admission showed no abnormalities.  Intraoperative Gram stain of specimen from the right knee and cultures  were done which showed no growth x3 days and no anaerobes isolated.  On  the Gram stain, rare wbc's present with no organism seen.  Two units of  packed RBCs were transfused during this hospitalization.   HOSPITAL COURSE:  The patient underwent right knee revision with  revision of polyethylene tibial component extensive synovectomy on April 25, 2008, as well as described on Dr. Berenice Dyer' operative note.  She was  not given IV antibiotics preoperatively, but once the cultures were  taken, these were given.  Postoperatively, she was put on Coumadin for  DVT prophylaxis and was put on the PCA morphine pump for pain control  with IV fluids.  She got out of bed with physical therapy ambulating and  weightbearing as tolerated on the right with a knee immobilizer.  She  was also using  a walker.  On postop day #1, she had no complaints other  than feeling lightheaded.  Her hemoglobin was 7.6, her INR was 1.3, and  BMET was within normal limits other than slightly decreased sodium of  133.  Her abdomen was nontender and she had positive bowel sounds.  Her  right knee dressing was clean and dry and he was intact distally.  Two  units of packed RBCs were transfused.  On postop day #2, she stated her  knee was sore.  She was taking fluids without difficulty.  Her Foley  catheter was in place at the time of surgery and was intact.  Vital  signs were stable.  She is afebrile.  Hemoglobin was 9.6.  Her BMET was  within normal limits.  She had a sliding scale insulin.  Her INR was  1.5.  Her Hemovac drain was pulled and her right knee dressing was  changed.  Her Foley catheter discontinued.  She was continued with  physical therapy.  On postop day #3, she stated she was better.  She was  taking fluids and voiding without difficulty.  Work with physical  therapy.  Hemoglobin was 9.5.  BMET within normal limits.  Dressing was  benign.  On postop day #4, the patient is doing well.  She had good pain  management orally.  Her INR was 3.0.  Her right knee dressing was clean  and dry.  The wound was benign.  Calf is soft and intact distally.  The  patient was discharged home in improved condition.   Medication at discharge included Percocet 5 mg one to two q.6 h. p.r.n.  pain, Coumadin per pharmacy protocol x1 month postop DVT prophylaxis,  metoprolol 50 mg one daily, isosorbide 5 mg b.i.d., Azor 10/40 mg one  daily, Lipitor 80 mg nightly, estradiol patch one weekly, changed on  Tuesdays, Actos 45 mg q.a.m., Januvia 100 mg nightly, metformin 1000 mg  b.i.d., alprazolam 0.5 mg nightly, Axert 12.5 mg as needed, NitroQuick  0.4 mg as needed, metoclopramide 10 mg with meals and at bedtime  dressing, VESIcare 5 mg at bedtime, and Nexium 40 mg b.i.d.  Activity,  she has to be weightbearing  as tolerated on the right with a walker.  She will need home health physical therapy and home health Coumadin  management x1 month postop.  She will follow  up in our office in 10-14  days.      Gary Fleet, P.A.      Alta Corning, M.D.  Electronically Signed    JB/MEDQ  D:  07/06/2008  T:  07/07/2008  Job:  [id number]   cc:   Biagio Borg, MD  Pricilla Riffle. Fuller Plan, MD, [facility]

## 2011-02-19 NOTE — Cardiovascular Report (Signed)
NAMEIDALENE, ZIMA               ACCOUNT NO.:  [id number]   MEDICAL RECORD NO.:  [id number]          PATIENT TYPE:  OIB   LOCATION:  NA                           FACILITY:  [facility]   PHYSICIAN:  Mohan N. Terrence Dupont, M.D. DATE OF BIRTH:  1951/04/22   DATE OF PROCEDURE:  02/24/2007  DATE OF DISCHARGE:                            CARDIAC CATHETERIZATION   __________   PROCEDURE DONE:  Cardiac cath and selective left and right coronary  angiogram __________  in the right groin using Judkins technique.   INDICATIONS FOR PROCEDURE:  Ms. Ronchetti is a 60 year old black female  with a past medical history significant for coronary artery disease,  status post PCI in the past; hypertension; insulin-requiring diabetes  mellitus, morbid obesity, positive family history of coronary artery  disease.  Complains of retrosternal chest pain described as __________  associated with shortness of breath, lasting 30 minutes, relieved with  rest.  Also complains of dyspnea on minimal exertion associated with  feeling weak and tired.  She  denies any nausea, vomiting, diaphoresis,  but complains of occasional palpitations.  No lightheadedness or  syncope.  Patient had EKG done at Dr. __________ office which showed  normal sinus rhythm with poor R wave progression in V1 through V3  __________   PAST MEDICAL HISTORY:  As above.   PAST SURGICAL HISTORY:  She had right total __________  replacement in  the past and __________ in the past, had tonsillectomy __________   ALLERGIES:  SHE IS ALLERGIC TO HYDROCODONE.   MEDICATIONS AT HOME:  She is on Januvia 100 mg p.o. daily, Actos 45 mg  p.o. daily, metformin 1000 mg twice daily, Humulin insulin 20 units  twice daily, Lantus insulin 40 units every morning, Azor 10/40 p.o.  daily, clonidine 0.2 mg p.o. daily, metoprolol 50 mg twice daily, Imdur  30 mg p.o. daily, Nitrostat 0.4 mg sublingual p.r.n., enteric-coated  aspirin 81 mg p.o. daily.   SOCIAL HISTORY:   She is married and has 5 children, on disability.  Worked as a Psychologist, clinical.   FAMILY HISTORY:  Positive for coronary artery disease, hypertension and  diabetes.   EXAMINATION:  GENERAL:  She was alert, awake, oriented x3, in no acute  distress.  VITAL SIGNS:  Blood pressure 150/90, pulse was 82 regular.  HEENT:  Conjunctivae were pink.  NECK:  Supple.  No JVD, no bruit.  LUNGS:  Clear to auscultation without rhonchi or rales.  CARDIAC:  Heart sounds S1, S2 were normal.  There was a soft systolic  murmur.  ABDOMEN:  Soft.  Bowel sounds were present, nontender.  EXTREMITIES:  There was no clubbing, cyanosis or edema.   IMPRESSION:  New-onset angina, coronary artery disease, history of  percutaneous coronary intervention in the past.  Hypertension, insulin-  requiring diabetes mellitus, morbid obesity, coronary artery disease,  and positive family history of coronary artery disease.   Discussed with the patient regarding various options of treatment, i.e.  noninvasive stress testing versus __________ Risks and benefits, i.e.  death, MI, stroke, need for emergency CABG, risk of restenosis, local  vascular complications, etcetera,  and consented for the procedure.   PROCEDURE:  After obtaining the informed consent, patient was brought to  the cath lab and was placed on fluoroscopy table.  Right groin was  prepped and draped in usual fashion.  2% Xylocaine was used for local  anesthesia in the right groin.  With the help of thin-wall needle, a 4-  French arterial sheath was placed.  The sheath was aspirated and  flushed.  A 4-French left Judkins catheter was advanced over the wire  under fluoroscopic guidance up to the ascending aorta.  Wire was pulled  out, the catheter was aspirated and connected to the manifold.  Catheter  was further advanced and engaged into left coronary ostium.  Multiple  views of the left system were taken.  Catheter was disengaged and was  pulled out  over the wire and was replaced with a 4-French right Judkins  catheter which was advanced over the wire under fluoroscopic guidance up  to the ascending aorta.  Wire was pulled out and the catheter was  aspirated and connected to the manifold.  Catheter was further advanced  and engaged into right coronary ostium.  Multiple views of the right  system were taken.  The catheter was disengaged and was pulled out over  the wire and was replaced with a 4-French pigtail catheter which was  advanced over the wire under fluoroscopic guidance into the ascending  aorta.  Wire was pulled out, the catheter was aspirated and connected to  the manifold.  Catheter was further advanced across aortic valve into  the LV.  LV pressures were recorded.  LV gram was done in 30 degrees RAO  position.  Postangiographic pressures were recorded from LV and then  pullback pressures were recorded from the aorta.  There was no gradient  across the aortic valve.  The pigtail catheter was pulled out over the  wire.  Sheaths were aspirated and flushed.   FINDINGS:  LV showed good LV systolic function, LVH EF of 60-65%.  Left  main was long which was patent.  LAD has 10-15% mid stenosis.  Diagonal  one is less than 1.5 mm, has 60-70% sequential mid stenosis.  Diagonal 2  is very very small which is patent.  Ramus is patent proximally and then  bifurcates into superior and inferior branch.  Superior branch is small  which has 70-80% focal stenosis.  Inferior branch is a larger branch  which is patent.  Left circumflex is patent.  OM1 is very small which is  patent.  OM2 is small which is patent.  RCA is patent.  The patient  tolerated the procedure well.  There were no complications.  Patient was  transferred to recovery room in stable condition.   Plan is to maximize antianginal medications.  If she continues to have  recurrent chest pain, then we will consider PCI to ramus branch.           ______________________________  Allegra Lai. Terrence Dupont, M.D.     MNH/MEDQ  D:  02/24/2007  T:  02/24/2007  Job:  [id number]   cc:   Cath Lab  Allegra Lai. Terrence Dupont, M.D.

## 2011-02-19 NOTE — Assessment & Plan Note (Signed)
[facility]                         GASTROENTEROLOGY OFFICE NOTE   NAME:Hartlage, LUCEY CONSER                      MRN:          [id number]  DATE:01/05/2008                            DOB:          Mar 05, 1951    Ms. [name] has had abdominal pain with postprandial nausea, vomiting,  early satiety, and frequent belching since discharged from the hospital.  I have reviewed the hospital records included on the chart with a  discharge summary from dates November 27, 2007 through December 11, 2007,  operative report dated November 27, 2007, and pathology report showing a  submucosal lipoma, and mild chronic gastritis.  She had a significant  postoperative stenosis at the site of her gastric wedge resection.  She  underwent upper endoscopy by Dr. Erskine Emery with an attempt to place  a stent, which was unsuccessful due to technical difficulties.  A  duodenal feeding tube was placed in the 3rd portion of the duodenum.  She has seen Dr. Hassell Done in followup since discharge.   CURRENT MEDICATIONS:  Listed on the chart, updated and reviewed.   MEDICATION ALLERGIES:  PENICILLIN.   PHYSICAL EXAMINATION:  An obese African American female who appears  fatigued, but in no acute distress.  Weight 260.4 pounds.  Blood pressure was 112/68.  Pulse 76 and regular.  CHEST:  Clear to auscultation bilaterally.  CARDIAC:  Regular rate and rhythm without murmurs appreciated.  ABDOMEN:  Soft and non-distended with upper abdominal tenderness to deep  palpation.  No rebound or guarding.  No palpable organomegaly, masses,  or hernias.  Normoactive bowel sounds.  She has a nicely-healed midline  incision.   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 She is to maintain a full liquid diet with  occasional soft foods as tolerated.  She may use Glucerna nutritional  supplements between meals.  Begin omeprazole 40 mg p.o. q.a.m. taken 30  minutes before breakfast.  Schedule an upper GI series to reassess  postoperative stenosis.  Consider endoscopic balloon dilation of the  stricture if it fails to resolve on its own, and this is agreeable to  Dr. Hassell Done and the patient.  Return office visit with me in 4 to 6  weeks.  In the interim, she will have return office visit with Dr.  Johnathan Hausen.     Pricilla Riffle. Fuller Plan, MD, [facility]  Electronically Signed    MTS/MedQ  DD: 01/07/2008  DT: 01/07/2008  Job #: [id number]   cc:   Isabel Caprice Hassell Done, MD

## 2011-02-19 NOTE — Discharge Summary (Signed)
NAMEFLORENTINE, [name]               ACCOUNT NO.:  [id number]   MEDICAL RECORD NO.:  [id number]          PATIENT TYPE:  INP   LOCATION:  [place]                         FACILITY:  [facility]   PHYSICIAN:  Isabel Caprice. Hassell Done, MD  DATE OF BIRTH:  06-Feb-1951   DATE OF ADMISSION:  11/27/2007  DATE OF DISCHARGE:  12/11/2007                               DISCHARGE SUMMARY   ADMITTING DIAGNOSIS:  Gastric mass suspicious for carcinoma.   DISCHARGE DIAGNOSIS:  Submucosal lipoma mass, benign.   PROCEDURE:  November 27, 2007 laparoscopy with inconcomitant endoscopy  and wedge excision x2 with laparotomy and then gastrogastrostomy.   COMPLICATIONS:  Apparent hemorrhage into gastrogastrostomy with external  compression producing acute blood loss anemia in the hospital and with  obstruction.  The patient required procedure per Dr. Deatra Ina which was  endoscopy and passage of a nasal feeding tube.   HOSPITAL COURSE:  [name] underwent the above-mentioned operation on  Friday, November 27, 2007.  On the Monday following she had an upper GI  that did not empty well and this was repeated in 2 days.  For that  reason, Dr. Deatra Ina was consulted and on the Friday, one week out from  surgery, she underwent endoscopy with passage through the  gastrogastrostomy with placement of a nasal feeding tube into the  duodenum.  This was used for a few days for feedings, but unfortunately  it became occluded.  The patient in the meantime was started on clear  liquids and tolerated that without any nausea or vomiting and seemed to  be going through fine.  She tolerated this well and was eager to move  forward with discharge.  Dr. Gwendolyn Grant saw the patient regarding  trying to get all of her medications into a crushed or liquid form.  This was done and the patient was made ready for discharge on Friday,  December 11, 2007. Arrangements were made for followup in Dr. Earlie Server  office next week.  Condition stable.   DISCHARGE MEDICATIONS:  1. Isosorbide dinitrate 10 mg b.i.d. which was given in place of the      Imdur.  2. Metformin 500 mg 2 tablets q.a.m., 2 tablets p.m. crush meds and      take with liquid or applesauce.  3. Lipitor 80 mg daily.  4. Vesicare 5 mg daily.  5. Lasix 80 mg as needed for swelling.  6. Zoloft 50 mg daily.  7. Prozac 40 mg daily.  8. Metoprolol 100 mg twice a day.  9. Clonidine 0.1 mg b.i.d.  10.Aspirin 81 mg to be held until follow-up.  11  Azor 10 mg one daily.  1. Actos 45 mg daily.  2. Climara 0.05 mg patch to take weekly.   The patient was instructed to stop her Imdur. In addition, I gave her a  prescription for Protonix suspension to take 40 mg a day.  The patient  was not having any pain. The patient can take Tylenol for pain if  needed.  The incision was healing nicely, staples out. The patient did  have a hemoglobin of 7.2 at the  time of discharge which has been stable  over several days.  Her white count has been hovering between 10 and 14  and there is no obvious source for this white count elevation.  She has  been afebrile with stable vital signs and is not complaining of any pain  or evidence of wound infection.  Follow-up in 1 week.  Condition stable.      Isabel Caprice Hassell Done, MD  Electronically Signed     MBM/MEDQ  D:  12/11/2007  T:  12/12/2007  Job:  [id number]

## 2011-02-19 NOTE — Op Note (Signed)
[name], [name]               ACCOUNT NO.:  [id number]   MEDICAL RECORD NO.:  [id number]          PATIENT TYPE:  INP   LOCATION:  5029                         FACILITY:  [facility]   PHYSICIAN:  Alta Corning, M.D.   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   POSTOPERATIVE DIAGNOSIS:  Painful scar tissue with  tight lateral bands  and anterior band.   PRINCIPAL PROCEDURE:  Right total knee revision by way of polyethelene  swap and thorough multiple compartment synovectomy.   SURGEON:  Alta Corning, MD   ASSISTANT:  Gary Fleet, PA   ANESTHESIA:  General.   BRIEF HISTORY:  Ms. Bower is a 60 year old female with a long history  of having had significant pain in the right knee.  She has underwent a  knee replacement about 5 years ago and had unfortunately a postoperative  fall where the knee got a burst back open.  She was taken to the  operating room for a closure and ultimately has had pain sort of  off  and on throughout that whole experience.  We did her left knee  replacement, she did well with that.  She wanted [place] to take a look and  see if we could revise her right knee.  Preoperative bone scanning was  normal.  Imaging was normal.  Fluid analysis was normal.  We ultimately  felt that she had some sort of either scar or synovectomy  problem, and  she was ultimately taken to the operating room for revision of the right  knee.   PROCEDURE:  The patient was taken to the operating room.  After adequate  anesthesia obtained with general anesthetic the patient was placed  supine on the operating table.  The right leg prepped and draped in  usual sterile fashion.  Following this, the leg was exsanguinated, blood  pressure tourniquet was inflated to 400 mmHg.  During this, she had some  blood pressure  elevations and essentially we just let the tourniquet  down and did the case without tourniquet control.  Ultimately once we  exposed this, we did a thorough synovectomy and probably took out a cup  and half of synovial proliferative tissue.  She had a sort of a strange  band phenomenon that was sort of laterally, which was kind of keeping  the lateral side from manipulating or moving as well as we would like  and so we took this band down laterally.  She had a sort of like an  anterior plica  almost where a band of tissue was going from the notch  to  the lateral side, which we took out.  She had a big medial synovial  proliferative process.  So, we took all of that out.  Ultimately,  exposed the tibia, and took out the polyethelene, cleared out all of the  back and ultimately trialed her with the 12, which seemed not bad  although she was a little loose in flexion and extension seems  reasonable.  Trialed her with the 15, certainly felt better in flexion  and she did not really lose much in the way of extension.  So,  ultimately we went from the 12 to a 15 in terms of poly.  At this point,  the wound was thoroughly lavaged with a pulsatile lavage irrigation  before the patellar poly was put in and the patient had a medium Hemovac  drain placed and the wound was closed with the combination of  interrupted and running nylon sutures.  Sterile  compression dressing was applied after the skin was closed with 0 and 2-  0 Vicryl and the skin staples, the patient was taken to the recovery  room, where she was noted to be in a satisfactory condition.  Estimated  blood loss for this procedure was __________.      Alta Corning, M.D.  Electronically Signed     JLG/MEDQ  D:  04/25/2008  T:  04/26/2008  Job:  [id number]

## 2011-02-19 NOTE — Assessment & Plan Note (Signed)
[facility] OFFICE NOTE   NAME:[name], [name]                      MRN:          [id number]  DATE:09/14/2007                            DOB:          [birth year]    REFERRING PHYSICIAN:  Biagio Borg, MD   REASON FOR CONSULTATION:  Left lower quadrant pain, hematochezia, and  anemia.   HISTORY OF PRESENT ILLNESS:  This is a 60 year old African-American  female who relates a 3-week history of moderate to severe left lower  quadrant pain.  Her symptoms started suddenly, and the pain does not  radiate.  Her symptoms are worsened by solid food, and she has been  eating a light diet and liquid diet for the past week or two.  She notes  nausea with occasional episodes of vomiting, no abdominal distention,  constipation, or diarrhea.  She did note bright red blood per rectum  last evening and has noted intermittent black stools for two weeks.  She  was evaluated by Dr. Jenny Reichmann on September 11, 2007.  I have reviewed the  [facility] record of that evaluation.  Blood work showed a white blood  cell count of 12.6, hemoglobin of 9.1, a low albumin at 3.3 and modestly  elevated alkaline phosphatase at 121.  Lipase was normal at 33.  Blood  work on December 1 had revealed a white blood cell count of 11.6 with a  hemoglobin of 7.8.  Iron was low at 33, transferrin normal at 286.3, and  vitamin B normal at 512.  She notes she has lost about 5 pounds over the  past week.  There is no family history of colon cancer,  colon polyps,  or inflammatory bowel disease.  She has not previously had colonoscopy  or sigmoidoscopy. Cipro was started several days ago for possible  diveritculitis.   PAST MEDICAL HISTORY:  1. Hypertension.  2. Coronary artery disease.  3. Diabetes mellitus, type 2.  4. Hyperlipidemia.  5. Morbid obesity.  6. Migraine headaches.  7. Anxiety.  8. History of a positive PPD.  9. Depression.   CURRENT  MEDICATIONS:  Listed on the chart: updated and reviewed.   MEDICATION ALLERGIES:  PENICILLIN.   SOCIAL HISTORY AND REVIEW OF SYSTEMS:  Per handwritten report.   PHYSICAL EXAMINATION:  GENERAL:  Obese, in mild to moderate discomfort.  VITAL SIGNS:  Height 5 feet 6-1/2 inches.  Weight 272.4 pounds.  Blood  pressure 150/90, pulse 72 with occasional ectopic beat.  HEENT:  Anicteric sclerae.  Oropharynx clear.  CHEST:  Clear to auscultation bilaterally.  CARDIAC:  Regular rate and rhythm without murmurs.  ABDOMEN:  Reveals mild left lower quadrant tenderness to deep palpation.  No rebound or guarding, no palpable organomegaly, masses, or hernias.  Normoactive bowel sounds.  RECTAL:  Exam deferred to time of colonoscopy.  EXTREMITIES:  Without clubbing, cyanosis, or edema.  NEUROLOGIC:  Alert and oriented x3, grossly nonfocal.   ASSESSMENT AND PLAN:  Persistent left lower quadrant pain. Hematochezia.  Iron-deficiency anemia. She has been treated with Cipro for several days  and does not appear to be  responding.  Rule out diverticulitis and/or a  diverticular abscess. Rule out a colorectal neoplasm.  She is to  maintain a liquid diet and continue Cipro 500 mg b.i.d.  Schedule a CT  scan of the abdomen and pelvis within the next 2 days.  Risks, benefits,  and alternatives discussed with the patient, and she consents to  proceed.  Schedule colonoscopy.  Risks, benefits, and alternatives of  colonoscopy with possible biopsy, and possible polypectomy discussed  with the patient, and she consents to proceed.  Timing of the  colonoscopy will depend on her clinical response to antibiotics and  findings on CT scan.  She will need iron replacement therapy in the near  future.     Pricilla Riffle. Fuller Plan, MD, [facility]  Electronically Signed    MTS/MedQ  DD: 09/15/2007  DT: 09/15/2007  Job #: [id number]   cc:   Biagio Borg, MD

## 2011-02-19 NOTE — Op Note (Signed)
NAMEGENIVA, LOOTENS               ACCOUNT NO.:  [id number]   MEDICAL RECORD NO.:  [id number]          PATIENT TYPE:  INP   LOCATION:  [place]                         FACILITY:  [facility]   PHYSICIAN:  Isabel Caprice. Hassell Done, MD  DATE OF BIRTH:  23-May-1951   DATE OF PROCEDURE:  11/27/2007  DATE OF DISCHARGE:                               OPERATIVE REPORT   PREOPERATIVE INDICATIONS:  This is a 60 year old African American female  who was evaluated for anemia.  She was found on endoscopy to have an  area along the antrum that appeared to have been a source of bleeding.  On biopsy, this was a hyperplasia.  On upper GI, there was some worry  that it might be a small gastric carcinoma.  Informed consent was  obtained regarding all levels the resection.  The patient underwent  preoperative cardiac clearance by Dr. Terrence Dupont and is brought to the OR  at this time.   PREOPERATIVE DIAGNOSIS:  Gastric mass in the antrum.   POSTOPERATIVE DIAGNOSIS:  Submucosal benign lipoma.   PROCEDURE:  Laparoscopy with endoscopy and wedge excision x2, with  laparotomy and gastrogastrostomy.   SURGEON:  Isabel Caprice. Hassell Done, M.D.   ASSISTANT:  Haywood Lasso, M.D.  Odis Hollingshead, M.D.   ANESTHESIA:  General.   FINDINGS:  Non-cancerous mass.   COMPLICATIONS:  The initial anastomosis I felt was a little bit on the  snug side so I created an adjacent gastrogastrostomy using the GIA.  It  did some submucosal flap raising during that which I tacked to the  staple line to complete the anastomosis.   DESCRIPTION OF PROCEDURE:  Tucker Bartlett was taken to OR 1 on Friday,  November 27, 2007, and given general anesthesia.  The abdomen was  prepped with TechniCare and draped sterilely.  I entered the abdomen  through the left upper quadrant using the OptiVu 0 degrees without  difficulty, the 5 mm variety.  The abdomen was insufflated and then I  could see the stomach, I placed some lower 5 mm and was able to  palpate  the stomach but could not obviously see the lesion in the antrum.  I  then passed an endoscope and Dr. Margot Chimes and I then identified the area  and that looked like flaming lips.  We localized that and I put a  stitch in it and then I went up and endoscoped it again to validate that  finding and then came back and attempted wedge excision.  This lesion  happened to be more posterior so we missed it with the first wedge  excision and I saw it back on endoscopy as I kept going back and forth  between the endoscope and the laparoscope.  So, I then made an upper  midline incision and then palpated the area and then excised it.  I then  closed this common defect in two layers of 4-0 PDS and 3-0 silk.  I then  put the endoscope down.  I could get the scope into the distal stomach  but I felt like it was a fairly narrow anastomosis.  Dr. Zella Richer came  in to help me at that point, Dr. Margot Chimes had to leave and do another  case.  I then created more on the posterior wall openings and introduced  the GIA and fired it.  I did elevate a submucosal flap on the inferior  side which was subsequently opened and then tacked it to the staple line  and this created a dual anastomosis.  The common defect was closed in  two layers of 4-0 PDS and 3-0 silk.  We changed our gloves and  irrigated.  The wound was closed with #1 Novofil interrupted and  irrigated and the skin was closed with staples.  The patient tolerated  the procedure well and was taken to the recovery room in satisfactory  condition.   FINAL DIAGNOSIS:  Non-cancerous mass of interest status post partial  gastrectomy.      Isabel Caprice Hassell Done, MD  Electronically Signed     MBM/MEDQ  D:  11/27/2007  T:  11/28/2007  Job:  [id number]   cc:   Biagio Borg, MD  [address] [address]   Malcolm T. Fuller Plan, MD, FACG  [address] [address]   Charles G. Electa Sniff, M.D.  Fax: [phone]

## 2011-02-22 NOTE — H&P (Signed)
[facility]  Patient:    [name], [name]                     MRN: [id number] Adm. Date:  02/20/01 Attending:  Jairo Ben, M.D.                         History and Physical  HISTORY OF PRESENT ILLNESS:  This is a 60 year old female patient who is a known diabetic and has a history of hypertension.  She is a patient of Dr. Renato Shin of the [facility].  She is being seen in the clinic with complaints of fast heart beat, shortness of breath, and chest pain x 1 week. The patient comes in initially saying that she is having panic attacks, but on further questioning, she has had some heart rates that have been going up fast and pounding, some shortness of breath associated with this, and also some chest pain that lasts anywhere from three to four minutes.  Actually, these episodes are happening both at rest and when she is moving around.  She sometimes may break out in a sweat.  There has been no nausea or vomiting with these symptoms.  The shortness of breath has also been going on over the past week.  She feels as if she cannot catch her breath when she lays down.  There has been no change in her orthopnea of two pillows.  She denies worsening of her lower extremity edema, although she has had episodes of swelling in the right leg after her knee surgery.  In addition, she describes the chest pain as sharp in nature and it may last a minute, up to two minutes, both at exertion and at rest.  Her last episode was this morning.  She denies this chest pain as being heaviness or a pressure sensation.  Her sugars have been consistently running high and that is not new.  CARDIAC RISK FACTORS:  She is a diabetic and has a history of hypertension, hyperlipidemia, and there is a strong family history of myocardial infarction with two brothers with MI at the age of 70 and 44.  They both died at those ages.  She had a sister who died at age 17 from  myocardial infarction also. She is a nonsmoker.  She is obese.  ALLERGIES:  No known drug allergies.  CURRENT MEDICATIONS:  1. Insulin 70/30.  She ran out of her insulin over the past three days.  Her     dosing is supposed to be 40 units b.i.d.  She also uses a sliding scale at     bed time, depending on what her sugars are.  2. Actos 25 mg p.o. q.d.  3. Glucophage 1 gram p.o. b.i.d.  4. Prozac 20 mg p.o. q.d.  She says this was started to curb her appetite.  5. Xenical 120 mg p.o. t.i.d.  6. Diovan 360 mg p.o. q.d.  7. Lipitor 10 mg p.o. q.d.  8. Amaryl 2 mg p.o. q. h.s.  9. Ambien 10 mg p.o. q. h.s. p.r.n. 10. Vioxx.  SOCIAL HISTORY:  She is married and has three kids.  FAMILY HISTORY:  Positive for the myocardial infarctions in two of her brothers and one of her sisters, diabetes mellitus in eight of her siblings and her grandson.  REVIEW OF SYSTEMS:  As per HPI.  She otherwise denies heart burn, nausea, vomiting, abdominal pain, melena, or hematochezia.  She denies any urinary symptoms also.  PHYSICAL EXAMINATION:  VITAL SIGNS:  Blood pressure 155/86, pulse 88, weight 270 pounds, temperature 99.8.  GENERAL:  She appears slightly anxious.  HEENT:  Fairly unremarkable.  Her right TM is slightly bulging out, but it is not red and light reflex is present bilaterally.  There is no sinus tenderness.  NECK:  No thyromegaly.  No lymphadenopathy.  Unable to detect JVD second to her size.  LUNGS:  Clear to auscultation bilaterally without any crackles or wheezes.  HEART:  Regular rate and rhythm, without any murmurs.  She does have about 1+ pretibial edema bilaterally.  No ankle edema.  ABDOMEN:  Protuberant.  Bowel sounds are normal.  Soft and nontender.  No organomegaly.  EKG:  An EKG was done which showed normal sinus rhythm.  There are no ST-T wave changes currently.  ASSESSMENT AND PLAN: #1 - UNSTABLE ANGINA:  She has enough risk factors that we will go ahead  and admit her to rule out myocardial infarction.  We will go ahead and start her on beta-blockers.  We will obtain CPK with troponin-I q.6h. x 2.  She will be started on standard heparin protocol with pharmacy to follow the dosing. Also, coated-aspirin p.o. q.d.  We will also obtain some other baseline laboratories.  #2 - DIABETES MELLITUS:  We will hold on the Glucophage until myocardial infarction is ruled out.  We will continue her other medications, with some sliding scale coverage.  #3 - HYPERTENSION:  She is to continue on her current medications.  #4 - HISTORY OF HYPERLIPIDEMIA:  She will continue on the Lipitor.  We will get a fasting cholesterol panel on her tomorrow.  #5 - POSSIBLE ANXIETY:  This may be what all of this is, but because she had enough risk factors, we needed to insure that cardiac wise she was stable.  We will go up on her Prozac to 40 mg p.o. q.d.  Dr. Renato Shin will be taking care of the patient at the hospital. DD:  02/20/01 TD:  02/21/01 Job: [id number]

## 2011-02-22 NOTE — Op Note (Signed)
[place]  Patient:    [name], [name] Visit Number: [id number] MRN: [id number]          Service Type: SUR Location: [id number] Attending Physician:  Clydie Braun Dictated by:   Audree Camel Noemi Chapel, M.D. Proc. Date: 02/27/02 Admit Date:  02/23/2002                             Operative Report  PREOPERATIVE DIAGNOSIS:  Right total knee wound dehiscence secondary to fall.  POSTOPERATIVE DIAGNOSIS:  Right total knee wound dehiscence secondary to fall.  PROCEDURES:  Right total knee irrigation, debridement, and wound closure.  SURGEON:  Audree Camel. Noemi Chapel, M.D.  ASSISTANT:  Matthew Saras, P.A.  ANESTHESIA:  General.  OPERATIVE TIME:  40 minutes.  COMPLICATIONS:  None.  INDICATIONS FOR PROCEDURE:  [name] is a 60 year old woman who had previously undergone a right total knee replacement on Feb 23, 2002.  She fell while in her hospital room earlier today, landing on her right total knee, dehiscing the surgical wound.  X-rays have revealed no evidence of any bony injury or injury to her total joint, but she is now to undergo irrigation, debridement, and wound closure.  DESCRIPTION OF PROCEDURE:  [name] was brought to the operating room on Feb 27, 2002, placed under general anesthesia in her own bed, and then carefully transferred to the operating table.  The area of the wound dehiscence was approximately five inches on the inferior half of her total knee incision.  The right leg was prepped using sterile Betadine and draped using sterile technique.  She received Ancef 2 g IV preoperatively for prophylaxis.  Initially the other staples were removed from the wound and then after being prepped and draped sterile, the sterile knee incision was opened. She was found to have partial rupture of the deep sutures of her median arthrotomy.  There was found to be no evidence of bony injury.  The superficial aspect of the wound was  first thoroughly irrigated with 3 L of saline solution with a pulse lavage and then the Ethibond arthrotomy sutures were removed an the joint was entered.  She was found to have a very well fixed prosthesis, both the femoral component, the tibial component, and the patellar component.  The knee could be taken through a range of motion from 0-120 degrees with no instability of the prosthesis.  The joint itself was then further irrigated with 3 L of saline solution with the jet lavage system followed by 500 cc of antibiotic solution and hematoma from the initial total knee surgery was thoroughly evaluated from the knee joint.  After this was done, it was felt that there had been further irrigation and satisfactory irrigation and debridement of the surgical wound and of the arthrotomy.  Then the arthrotomy was closed with multiple #1 Ethibond mattress sutures.  After this was done and a very tight closure was obtained, the knee was taken through a range of motion, 0-120 degrees, with excellent stability and normal patellar tracking.  At this point then the superficial aspect of the wound was further irrigated with saline and then the rest of the incision and wound was closed with 0 and 2-0 Vicryl and skin staples.  Sterile dressings were applied.  The patient was then awaken and taken to the recovery room in stable condition.  The needle and sponge counts were correct x 2 at the end  of the case. Dictated by:   Audree Camel Noemi Chapel, M.D. Attending Physician:  Clydie Braun DD:  02/27/02 TD:  02/28/02 Job: [id number]

## 2011-02-22 NOTE — H&P (Signed)
[name], Dyer               ACCOUNT NO.:  [id number]   MEDICAL RECORD NO.:  [id number]          PATIENT TYPE:  INP   LOCATION:  [place]                         FACILITY:  [facility]   PHYSICIAN:  Isabel Caprice. Hassell Done, MD  DATE OF BIRTH:  1951/07/03   DATE OF ADMISSION:  11/27/2007  DATE OF DISCHARGE:  12/11/2007                              HISTORY & PHYSICAL   ADMITTING DIAGNOSES/CHIEF COMPLAINT:  Polypoid ulcerated lesion in the  stomach, on endo.   HISTORY:  [name] was having some anemia issues and underwent an  endoscopy which found this polypoid ulcerated lesion in the stomach on  endo.  Upper GI showed this to be about 3-cm sessile polyp.  She was  seen by me in the office on September 29, 2007 for this, and we discussed  removal.   She was aware of the risk of having to possibly have a total  gastrectomy.  Was scheduled to come in for laparoscopy with upper  endoscopy to find this place and try to resect it.   PAST MEDICAL HISTORY:  ALLERGIES:  Penicillin.   MEDICATIONS:  Metformin 500 b.i.d., Actos 45 mg, Januvia 100 mg, Axert,  clonidine, Prozac, sertraline 50 mg.   PAST SURGERIES:  Includes carpal tunnel syndrome, complete hysterectomy,  right knee replacement, left knee replacement.   PHYSICAL EXAM:  GENERAL:  Well-developed, well-nourished, African-  American female.  HEENT:  Unremarkable.  NECK:  Supple without adenopathy.  No carotid bruits.  CHEST:  Clear.  HEART:  Sinus rhythm without murmurs or gallops.  ABDOMEN:  Obese and no masses are noted.  EXTREMITIES:  Large legs, large hips.  No clubbing or edema.  NEURO:  Alert and oriented x3.  Motor and sensory function are grossly  intact.   IMPRESSION:  Mass seen on endoscopy and upper gastrointestinal.   PLAN:  Laparoscopically-assisted partial gastrectomy or possible total  gastrectomy.      Isabel Caprice Hassell Done, MD  Electronically Signed     MBM/MEDQ  D:  01/06/2008  T:  01/06/2008  Job:  [id number]

## 2011-02-22 NOTE — Cardiovascular Report (Signed)
[place]. [facility]  Patient:    [name], [name]                      MRN: [id number] Proc. Date: 01/01/00 Adm. Date:  [id number] Attending:  Wellington Hampshire CC:         Gloris Ham, M.D. [facility]             Minus Breeding, M.D. [facility]             Catheterization Laboratory                        Cardiac Catheterization  PROCEDURE PERFORMED:  Left heart catheterization with coronary angiography and eft ventriculography.  INDICATION:  [name] is a 60 year old woman with diabetes and hyperlipidemia. She has been having progressive chest pain with exertion and at rest.  DESCRIPTION OF PROCEDURE:  A 6 French sheath was placed in the right femoral artery.  Standard Judkins 6 French catheters were utilized.  Contrast was Omnipaque.  There were no complications.  RESULTS:  Hemodynamics:  Left ventricular pressure 140/10, aortic pressure 146/90.  There is no aortic valve gradient.  Left ventriculogram:  There is mild hypokinesis of the inferior apical wall. Otherwise, wall motion is normal and ejection fraction is calculated at 68%. No mitral regurgitation.  CORONARY ARTERIOGRAPHY (RIGHT DOMINANT):  Left main:  Normal.  LAD:  Has a diffuse 20% stenosis in the mid vessel followed by a 30% stenosis in the distal portion of the mid vessel.  The LAD gives rise to two small diagonal  branches.  Left circumflex:  Gives rise to a normal bifurcating OM-1, small OM-2, small OM-3, and a small OM-4.  The left circumflex is free of angiographic disease.  Right coronary artery:  Has a 20% stenosis in the proximal vessel with a component of catheter-induced spasm which was relieved with nitroglycerin.  It gives rise to a normal-sized posterior descending artery and two small posterolateral branches.  IMPRESSIONS: 1. Left ventricular systolic function characterized by mild inferior apical    hypokinesis but overall normal ejection fraction. 2.  Nonobstructive coronary artery disease. DD:  01/01/00 TD:  01/02/00 Job: [id number]

## 2011-02-22 NOTE — Discharge Summary (Signed)
[facility]  Patient:    [name], [name]                      MRN: [id number] Adm. Date:  [id number] Disc. Date: [id number] Attending:  Tera Helper                           Discharge Summary  DISCHARGE DIAGNOSES: 1. Chest pain, unclear etiology, probable musculoskeletal. 2. Diabetes mellitus. 3. Hypertension. 4. Hypercholesterolemia. 5. Anxiety. 6. Morbid obesity.  PROCEDURES:  None.  CONSULTATIONS:  None.  HISTORY OF PRESENT ILLNESS:  Please see that dictated per Dr. Boyce Medici on day of admission.  HOSPITAL COURSE:  [name] is a 60 year old, African-American female with multiple cardiac risk factors who presented to Dr. Boyce Medici on the date of admission with complaints prior to that of vague chest discomfort, shortness of breath, pain mostly sternal and minor, lasting three to four minutes with one episode only.  This was questionably pleuritic and sometimes with shortness of breath and cool sweats, but no associated with the chest discomfort.  This was clearly nonexertional.  On the date of discharge, she had had no further discomfort and in fact, none for over 48 hours prior to discharge including 24 hours prior to admission.  She remained afebrile without new complaints.  Serial enzymes were negative.  It was felt that she had gained maximum benefit from this hospitalization and is to be discharged to home.  DISPOSITION:  Discharged home in good condition.  DISCHARGE MEDICATIONS:  As per admission.  FOLLOWUP:  Follow up with Dr. Lissa Merlin in one to two weeks.  Consider outpatient stress Cardiolite with her multiple risk factors. DD:  02/21/01 TD:  02/23/01 Job: [id number]

## 2011-02-22 NOTE — Discharge Summary (Signed)
NAMESHARLI, DILLAHUNT               ACCOUNT NO.:  [id number]   MEDICAL RECORD NO.:  [id number]          PATIENT TYPE:  INP   LOCATION:  5008                         FACILITY:  [place]   PHYSICIAN:  Alta Corning, M.D.   DATE OF BIRTH:  07-09-1951   DATE OF ADMISSION:  04/15/2007  DATE OF DISCHARGE:  04/20/2007                               DISCHARGE SUMMARY   ADMITTING DIAGNOSES:  1. End-stage degenerative joint disease, left knee.  2. Obesity.  3. Insulin-dependent diabetes mellitus.  4. Hypertension.  5. Hyperlipidemia.  6. Generalized osteoarthritis.   DISCHARGE DIAGNOSES:  1. End-stage degenerative joint disease, left knee.  2. Obesity.  3. Insulin-dependent diabetes mellitus.  4. Hypertension.  5. Hyperlipidemia.  6. Generalized osteoarthritis.  7. Blood loss anemia.   CONSULTATIONS:  Consultations in the hospital were none.   LABORATORY DATA:  Pertinent laboratory studies:  Her EKG on admission  showed normal sinus rhythm and possible anterior infarct with no  significant change compared to her previous EKG on January 01, 2006.  Hemoglobin on admission was 11.6, hematocrit 35.6 and indices were  within normal limits.  On postop day number one her hemoglobin was 9.7  and on day 2 it was 8.2.  After 2 units of packed RBCs were transfused  her hemoglobin was up to 10.4.  Pro time on admission was 15.1 seconds,  INR 1.2 and PTT was 30.  On the day of discharge her INR was 2.2 with a  pro time of 25.3.  BMET on admission showed an elevated  glucose of 158.  The BMET was followed during this hospitalization and it continued to be  in  acceptable levels, but the blood sugar went to 278 and ranged down  to 146.  Urinalysis showed no abnormalities on admission.  Two units of  packed RBCs were transfused on April 17, 2007.   HOSPITAL COURSE:  The patient was admitted to the hospital and was given  Ancef 1 gram IV preoperatively along with  gentamicin 80 mg IV.  Postoperatively she  was given Ancef 1 gram IV every 8 hours times 24  hours.  A  PCA morphine pump was ordered for pain control.  Physical  therapy was ordered for walker ambulation and weightbearing as tolerated  on the left, and a CPM machine was used for range of motion.  Intravenous fluids were discontinued and she was put on sliding scale  insulin.  Her usual home medications were continued.   On postoperative day number one she had moderate knee pain and she had  had some spasms in her leg.  Her laboratory data was stable with a  hemoglobin of 9.7.  The patient was alert and oriented.  She was  gotten  out of bed by physical therapy.   On postoperative day number two she had some sensation of dizziness.  She had complaints of left knee pain and was taking fluids without  difficulty.  She had good urine output with her Foley catheter, which  was in place from the time of surgery.  She had no chest pain or  shortness of breath.  Her vital signs were stable.  Hemoglobin was 8.2.  Her INR was 1.7.  She did have a little tachycardia, which was felt to  be secondary to her low hemoglobin.  We held her blood pressure  medication to maintain her blood pressure since she was anemic.  Her  dressing was changed, her Hemovac drain was pulled and her Foley  catheter was continued for in and out monitoring.   On postoperative day number three she had moderate knee pain, but she  felt better.  Her hemoglobin was 10.4 and her INR was 2.3.  Incentive  spirometry was encouraged since she had spiked a fever up to 101.  We  continued to mobilize her with physical therapy, but she did seem to be  a fall risk.   On postoperative day number four she was making good progress.  She had  an INR of 2.4.   On postoperative day number five she had an INR 2.2, she was doing well,  she was still complaining of moderate pain, she was afebrile, and her  vitals signs were stable.  Her left knee wound was clean and dry.    DISCHARGE CONDITION:  The patient was discharged home in an improved  condition.   DIET:  The patient is on a diabetic diet.   The patient's dressing was changed.   DISCHARGE MEDICATIONS:  The patient was given Rx with:  1. Tylox as needed for pain.  2. Robaxin 700 mg as needed for spasms.  3. Coumadin per pharmacy protocol for DVT prophylaxis times one month      postoperatively.   DISCHARGE INSTRUCTIONS:  The patient will have Home Health physical  therapy and a Home Health R.N. for pro times.  She is to  ambulate  weightbearing as tolerated with total knee precautions.   FOLLOW UP:  The patient will follow up in the office in 7-10 days.  She  will follow up with her medical doctor for her previously scheduled  appointment or if any problems occur.      Gary Fleet, P.A.      Alta Corning, M.D.  Electronically Signed    JB/MEDQ  D:  06/10/2007  T:  06/11/2007  Job:  [id number]   cc:   Biagio Borg, MD  Parke Poisson Electa Sniff, M.D.

## 2011-02-22 NOTE — Op Note (Signed)
NAMESANTASHA, KARPMAN               ACCOUNT NO.:  [id number]   MEDICAL RECORD NO.:  [id number]          PATIENT TYPE:  INP   LOCATION:  5008                         FACILITY:  [place]   PHYSICIAN:  Alta Corning, M.D.   DATE OF BIRTH:  [birth year]   DATE OF PROCEDURE:  DATE OF DISCHARGE:  04/20/2007                               OPERATIVE REPORT   She is a patient on the orthopedic surgery service.  She is 60.   PREOPERATIVE DIAGNOSIS:  End-stage degenerative joint disease of the  left knee.   POSTOPERATIVE DIAGNOSIS:  End-stage degenerative joint disease of the  left knee.   PRINCIPAL PROCEDURE:  1. Left total knee replacement with sigma system size 3 femur, size      2.5 tibia, 12.5 mm bridging bearing, 35 mm all polyethelene      patella.  2. Computer-assisted total knee replacement, left.   SURGEON:  Dorna Leitz, M.D.   ASSISTANT:  Gary Fleet, P.A.   ANESTHESIA:  General.   BRIEF HISTORY:  Ms. Essington is a 60 year old female with a long history  of having had significant bilateral knee pain.  We treated  conservatively for a long period of time with activity modification,  anti-inflammatory medication and use of a cane.  In the setting of all  conservative care, she was taken to the operating room for left total  knee replacement.  Because of her large size and young age, it was felt  that computer-assisted knee replacement be the most appropriate course  of action.  This was decided to be used preoperatively.   PROCEDURE:  The patient was brought to the operating room and after  adequate anesthesia was obtained with general anesthetic, the patient  was placed supine on the operating table.  The left leg was then prepped  and draped in the usual sterile fashion.  Following this, a midline  incision was made through the subcutaneous tissues down to the level of  the extensor mechanism.  A medial parapatellar arthrotomy was undertaken  and following this, the anterior  and posterior cruciate ligaments were  excised as well as the medial and lateral meniscus and the retropatellar  fat pad.  Once this was completed, attention was turned towards computer-  assistance module.  Two pins were placed outside the wound in the tibia.  Two pins were placed inside the wound on the femoral side and the rays  were attached to these pins. Once this was done, the registration was  undertaken.  Registration and use of the computer throughout the case  adds about 20 minutes to the surgical procedure.  Once the registration  was undertaken along the line that was assessed, greater medial release  was undertaken.  Following this, attention was turned towards the tibia,  where under computer assistance, the tibia was cut perpendicular to its  long axis.  We then gauged the gaps in extension and flexion and then  cut the distal femur perpendicular to the anatomic long axis.  Once this  was completed, the spacer blocks were used and 12.5 seemed to be the  appropriate size.  The femur was then sized to a 3 and three cutting  block was used.  Anterior and posterior cuts were made as well as the  __________  cuts.  Once this was completed, the spacer  blocks were  again used and it felt that 12.5 was going to be the appropriate course  of action, both flexion and extension.  At this point, the attention was  turned to the patella which was sized to a 35 and cut down to a level of  14 and the lugs 35, patella were drilled.  Prior to doing the patella,  the tibia was exposed and it was sized to a 2.5.  Pins were placed and  it was punched and the derotational peel was used as well.  Once the  patella had been cut as outlined above, all trial components were placed  in, put through a range of motion.  Excellent stability and flexion and  extension and no tendency towards instability.  Long alignment was  neutral, 0 degrees according to the computer assistance.  At this point,  all  trial components were removed and the final components were cemented  into place and the outward cement was removed with cement tools.  Once  this was completed, the knee was put through a range of motion.  Perfect  neutral along alignment and the extension and gap balances were within a  millimeter in flexion and extension.  At this point, the knee was  copiously and thoroughly irrigated.  A tourniquet was let down.  All  bleeding was controlled with electrocautery.  At this point, the medial  parapatellar arthrotomy was closed with 1-Vicryl running suture.  The  skin with 0 and 2-0 Vicryl and skin staples.  A gentle compression  dressing was applied as well as a knee immobilizer.  The patient was  taken to the recovery room and noted to be in a satisfactory condition.  Estimated blood loss for the procedure was none.  Of note, Gaspar Skeeters  assisted throughout the case.      Alta Corning, M.D.  Electronically Signed     JLG/MEDQ  D:  06/24/2007  T:  06/25/2007  Job:  [id number]

## 2011-02-22 NOTE — Op Note (Signed)
[place]. [facility]  Patient:    [name], [name] Visit Number: [id number] MRN: [id number]          Service Type: SUR Location: [id number] Attending Physician:  Clydie Braun Dictated by:   Audree Camel Noemi Chapel, M.D. Proc. Date: 02/23/02 Admit Date:  02/23/2002                             Operative Report  PREOPERATIVE DIAGNOSIS: Right knee degenerative joint disease.  POSTOPERATIVE DIAGNOSIS: Right knee degenerative joint disease.  OPERATION/PROCEDURE:  1. Right total knee replacement using [employer] Scorpio total knee system     with #7 cemented femoral component, #7 cemented tibial component, with 12     mm polyethylene tibial spacer and 26 mm polyethylene cemented patella.  2. Right knee lateral retinacular release.  SURGEON: Audree Camel. Noemi Chapel, M.D.  ASSISTANT: Matthew Saras, P.A.  ANESTHESIA: General.  OPERATIVE TIME: One hour and 45 minutes.  COMPLICATIONS: None.  DESCRIPTION OF PROCEDURE: Ms. Holderbaum was brought to the operating room on Feb 23, 2002 and placed on the operative table in the supine position.  After an adequate level of general anesthesia was obtained she had a Foley catheter placed under sterile conditions and received Ancef 1 g IV preoperatively for prophylaxis.  Her right knee was examined under anesthesia.  Range of motion from 0-120 degrees, moderate varus deformity, knee stable ligamentous examination with normal patella tracking.  The right leg was prepped using sterile DuraPrep and draped using sterile technique.  The leg was exsanguinated and a thigh tourniquet elevated to 400 mm Hg.  Initially through a 20 cm longitudinal incision based over the patella initial exposure was made.  The underlying subcutaneous tissues were incised along the skin incision.  A median arthrotomy was performed revealing an excessive amount of normal-appearing joint fluid.  The articular surfaces were examined.  She had grade IV  changes medially, grade II changes laterally, and in the patellofemoral joint grade III and IV chondromalacia changes noted.  The medial and lateral meniscal remnants were resected as well as the anterior cruciate ligament.  Osteophytes were removed off the femoral condyles and tibial plateau.  An intramedullary drill was then drilled up the femoral canal for placement of the distal femoral cutting jig, which was placed in the appropriate amount of rotation and then a distal 10 mm cut was made.  The distal femur was then sized.  A #7 was found to be the appropriate size and the #7 cutting jig was placed and then these cuts were made.  After this was done then the proximal tibia was exposed.  The tibial spines were removed with an oscillating saw.  A tibial drill was placed down the proximal tibial canal for placement of the proximal tibial cutting jig, which was then placed in the appropriate amount of rotation and then a proximal 8 mm cut was made.  After this was done then the Scorpio PCL cutter was placed back on the distal femur and these cuts were made.  After this was done then the #7 femoral trial was hammered onto the distal femur with an excellent fit.  The #7 tibial base plate with the 12 mm polyethylene spacer was placed.  This was found to give excellent position, excellent restoration of normal alignment with range of motion from 0-115 degrees, with no lift-off on the tray and excellent stability.  The tibial base plate was then  marked for rotation and then the Keel cut was made.  After this was done the patella was sized.  A 26 mm was found to be the appropriate size and a recessed 10 mm x 26 mm cut was made and three locking holes placed.  After this was done it was felt that all the trial components were of excellent size, fit, and stability.  The knee was then irrigated with 3 liters of saline solution.  The proximal tibia was then exposed.  The #7 tibial component with  cement backing was hammered into position with an excellent fit, with excess cement being removed from around the edges.  The #7 femoral component with cement backing was hammered into position also with an excellent fit, with excess cement being removed from around the edges.  The 12 mm polyethylene spacer was locked on the tibial base plate.  The knee was taken through range of motion and found to be stable with restoration of normal alignment.  The patella, 26 mm size, with cement backing was placed into its recessed hole and held there with a clamp.  After the cement hardened patellofemoral tracking was evaluated.  There was still found to be excessive tightness noted on the patellofemoral tracking and thus a lateral retinacular release was carried out, which helped decompress the patellofemoral joint and helped patella tracking to be normal.  At this point it was felt that all components were of excellent size, fit, and stability. The knee was further irrigated with antibiotic solution.  The median arthrotomy was then closed with #1 Ethibond suture over two medium Hemovac drains.  The subcutaneous tissues were closed with 0 and 2-0 Vicryl and the skin closed with skin staples.  Sterile dressings were applied.  The Hemovac was injected with 0.25% Marcaine with epinephrine and clamped.  The tourniquet was released.  She then had a femoral nerve block placed by anesthesia for postoperative pain control.  She was then awakened and taken to the recovery room in stable condition.  Neurovascular status and pulses were 2+ and symmetric postoperatively.  Needle and sponge counts were correct x2 at the end of the case. Dictated by:   Audree Camel Noemi Chapel, M.D. Attending Physician:  Clydie Braun DD:  02/23/02 TD:  02/24/02 Job: [id number]

## 2011-02-22 NOTE — Discharge Summary (Signed)
[name]. [facility]  Patient:    Sherry, Dyer Visit Number: [id number] MRN: [id number]          Service Type: SUR Location: [place] Attending Physician:  Clydie Braun Dictated by:   Matthew Saras, P.A. Admit Date:  02/23/2002 Discharge Date: 03/03/2002                             Discharge Summary  ADMISSION DIAGNOSES: 1. 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 2. Insulin dependent diabetes. 3. Hypertension. 4. High cholesterol. 5. Obesity.  DISCHARGE DIAGNOSES: 1. End stage degenerative joint disease right knee, status post right total    knee replacement. 2. Insulin dependent diabetes. 3. Hypertension. 4. High cholesterol. 5. Obesity. 6. Disruption of external wound secondary to fall.  HISTORY OF PRESENT ILLNESS: The patient is a 60 year old black female who had bilateral knee end stage degenerative joint disease, right more painful than left. She tried anti-inflammatories, Cortisone injections, and PT without success. At this point in time, she understands the risks, benefits, and possible complications of a total knee replacement.  PROCEDURES: On Feb 23, 2002 the patient underwent a right total knee replacement by Dr. Noemi Chapel. Second surgical procedure was done on Feb 27, 2002. The patient was getting up from the toilet. Stood and fell forward on her knee. Opened her knee wound approximately four inches.  HOSPITAL COURSE: The patient was taken to the OR for irrigation, debridement, and closure of her right knee wound. The patient was admitted on Feb 23, 2002 for postoperative pain control, deep vein thrombosis prophylaxis, and PT. Internal Medicine was consulted to see her for diabetes. Her CBG in PACU was 299. She was placed on a sliding scale. The patient had episode of hypotension postoperatively. Blood pressure was 90/50. She had a 500 cc bolus. Urine output was only  25 cc after bolus. Blood pressure was 85/50. An hemoglobin and hematocrit was ordered. Hemoglobin came back 10.3. Hydration continued. The patients hemoglobin on postoperative day one at 5:00 a.m. was 8.9. Renal function was decreased with a creatinine of 1.8. She received two units of packed red blood cells and her hemoglobin that night at 10:00 p.m. was 10.9. On postoperative day two patient was much improved. Renal function improved with creatinine of 1.4. Hemoglobin was 11 post transfusion. On postoperative day three the patient was doing very well except for pain. She was encouraged to take pain medicine regularly. Range of motion was 0 to 45. Surgical wound was well approximated. She was neurovascularly intact. Hemoglobin was 10.7. INR was 2.0. She was metabolically stable except for a high glucose of 181. Rehab consult was ordered on postoperative day four in the morning. The patient was doing well. She had a small amount of bloody drainage from her distal wound. Hemoglobin was 10. Renal function continued to improve with creatinine of 1.2. INR was 2.4. She was metabolically stable. Blood pressure was 188/72. Her Diovan was restarted. At 5:15 in the afternoon, I was called to the floor. The patient had taken a fall. She had opened up the bottom four inches of her wound. Sterile 4x4s and ABD were applied. X-rays were taken and the patient was taken to the OR immediately. She underwent irrigation with debridement of her wound and secondary closure. She tolerated the procedure well. On postoperative day five/postop day one, the patients hemoglobin was 9.1. She was  metabolically stable with good renal function. Postoperative day six/postop day two, blood pressure was 164/72. The patients hemoglobin was 8.8. INR was 2.3 and she was metabolically stable. Surgical wound was well approximated and healing. On postoperative day three/postop day seven, the patient has had some difficulty with  hypoglycemia. No complaints of shortness of breath or chest pain. Hemoglobin was 8.2. INR was 2.3. Oxycontin 10 mg b.i.d. was added for pain. Postoperative day eight/postop day four, surgical wound was well approximated. The patient was doing well except for hypoglycemia. [place]ondon saw her on postoperative day seven and felt that it was best managed on an outpatient basis. Surgical wound was well approximated and healing.  DISPOSITION: The patient was discharged to home in stable condition.  DISCHARGE MEDICATIONS: 1. Percocet 5/325 one to two q.4-6h. p.r.n. pain. 2. Oxycontin 10 mg one p.o. b.i.d. 3. Coumadin 3 mg one tab q.h.s. 4. Senokot S two tabs q.h.s. 5. Colace one tab two times a day. 6. Insulin 75/25 80 units in the morning and 20 units in the evening. 7. Glucophage XR 500 units two tabs b.i.d.  DIET: She was encouraged to maintain a diabetic diet.  WOUND CARE: She was encouraged to keep her wound clean and dry.  FOLLOW-UP: She will follow-up in the office on March 08, 2002. Dictated by:   Matthew Saras, P.A. Attending Physician:  Clydie Braun DD:  04/19/02 TD:  04/20/02 Job: [id number]

## 2011-02-22 NOTE — Discharge Summary (Signed)
[place]. [facility]  Patient:    [name], [name]                      MRN: [id number] Adm. Date:  [id number] Disc. Date: 01/02/00 Attending:  Wellington Hampshire. Dictator:   Helayne Seminole, P.A. CC:         Gloris Ham, M.D. [facility]             Minus Breeding, M.D. [facility]                           Discharge Summary  DISCHARGE DIAGNOSES:  1. Chest pain.  2. Gastroesophageal reflux disease.  3. Hypercholesterolemia.  4. Hyperglycemia.  5. Fatigue.  HISTORY OF PRESENT ILLNESS: [name] is a 60 year old African-American female with multiple cardiac risk factors, who had left-sided sharp chest pain intermittently for five days.  She also experienced diaphoresis and shortness of breath.  This pain awakens her from sleep, and seems to resolve with rest. She denies any nausea or vomiting.  She complains of feeling weak and tired. Blood sugars had been in the 400s.  PAST MEDICAL HISTORY:  1. Insulin-dependent diabetes mellitus.  2. Hyperlipidemia.  3. Hypertension.  4. Obesity.  5. Status post TAH/BSO.  HOSPITAL COURSE: #1 - CHEST PAIN: The patient was admitted to rule out myocardial infarction. Cardiology was asked to see the patient.  Given the patients multiple cardiac risk factors they were worried that this was true ischemia and recommended proceeding with cardiac catheterization.  The patient was started on low molecular weight heparin as well as a beta-blocker and an ARB.  The patients Glucophage was held pending catheterization.  The patient underwent cardiac catheterization on January 01, 2000 and that revealed nonobstructive coronary artery disease.  The patients chest pain was not felt to be discharged in nature.  She did also rule out for myocardial infarction.  The patient still has multiple cardiac risk factors that could be modified.  Specifically, the patient does have hypercholesterolemia.  Her fasting lipid profile was obtained and her total  cholesterol was 293, triglycerides 167, and LDL was 188.  The patients Lipitor was increased.  Also, the patient has poorly controlled diabetes mellitus.  Her hemoglobin A1C was 10.6%.  Her CBGs were persistently greater than 200 during this hospitalization.  #2 - GASTROESOPHAGEAL REFLUX DISEASE:  The patient did rule out for MI and her chest pain was not felt to be cardiac in nature and we did look at other possible etiology of her pain.  The patient does admit that she has been taking more BC Powders lately.  She states that she takes these [facility] Powders to alleviate the chest pain she is having.  The patient denies any melena or hematemesis.  She also denies any heartburn or indigestion.  She denies history of gastroesophageal reflux disease or peptic ulcer disease, and has never had an endoscopy.  In reviewing the patients medications she is also on Celebrex as-needed.  The patient is not on an H2 blocker or a Proton pump inhibitor.  Therefore, will start the patient on Protonix.  If the patients symptoms do not improve we recommend an outpatient GI evaluation.  The patient will also be instructed not to take [facility] or Goody Powders in addition to aspirin, nonsteroidal anti-inflammatories, and her Celebrex.  She will be instructed to use Tylenol or Darvocet as-needed for this pain.  #3 - FATIGUE: The  patient describes increased fatigue.  She perceives this to be secondary to her lower than normal blood glucose.  She states that she is used to blood glucose level greater than 300.  She states that she notes a decrease in her energy and increase in fatigue when her blood sugars are closer to normal - that is to say between 140 and 200.  Additionally, the patient has been receiving some nortriptyline at night during this hospitalization and this may also be contributing to her day time fatigue. The patient was advised of the need for stricter blood sugar control and her risk for cardiac disease  in the future.  We will defer further management to her primary physician.  LABORATORY DATA PRIOR TO DISCHARGE: Sodium 134.  Hemoglobin A1C 10.9%.  Total cholesterol 293, triglycerides 167, HDL 72, LDL 188.  TSH 0.852.  DISCHARGE MEDICATIONS:  1. Lotensin 40 mg b.i.d.  2. Amaryl 4 mg 2 tablets at bedtime.  3. Lasix 80 mg q.d.  4. Premarin 0.3 mg q.d.  5. Glucophage 1000 mg b.i.d.  6. Actos 45 mg q.d.  7. Humalog 25 units subQ in the morning.  8. Zinacal 120 mg t.i.d.  9. Lipitor 20 mg q.d. 10. Prozac 20 mg q.d. 11. Protonix 40 mg q.d. for a one month trial. 12. Darvocet or Tylenol as-needed for pain.  The patient has been instructed not to take nortriptyline.  She has been instructed not to take Celebrex, BC or Goody Powders, aspirin, or nonsteroidal anti-inflammatories for at least one month.  FOLLOW-UP: She is to follow up with Dr. Loanne Drilling in seven to ten days.  She has been instructed to check her blood sugars four times daily until she sees Dr. Loanne Drilling and to take these recorded blood sugars with her to her appointment. D:  01/02/00 TD:  01/02/00 Job: [id number]

## 2011-02-22 NOTE — Op Note (Signed)
[name], [name]               ACCOUNT NO.:  [id number]   MEDICAL RECORD NO.:  [id number]          PATIENT TYPE:  AMB   LOCATION:  [place]                          FACILITY:  [place]   PHYSICIAN:  Daryll Brod, M.D.       DATE OF BIRTH:  12/20/1950   DATE OF PROCEDURE:  01/02/2006  DATE OF DISCHARGE:                                 OPERATIVE REPORT   PREOPERATIVE DIAGNOSIS:  Carpal tunnel syndrome, right hand.   POSTOPERATIVE DIAGNOSIS:  Carpal tunnel syndrome, right hand.   OPERATION:  Decompression, right median nerve.   SURGEON:  Daryll Brod, M.D.   ANESTHESIA:  General.   HISTORY:  The patient is a 60 year old female with a history of carpal  tunnel syndrome.  EMG nerve conduction was positive; this has not responded  to conservative treatment.  The risks and complications were discussed with  the patient prior to the surgery.  She has agreed to undergo carpal tunnel  release.  The area was marked by both the patient and surgeon in the  preoperative area.   PROCEDURE:  The patient was brought to the operating room, where a general  anesthetic was carried out without difficulty.  She was prepped using  DuraPrep in the supine position with the right arm free.  A longitudinal  incision was made in the palm and carried down through subcutaneous tissue.  Bleeders were electrocauterized.  The palmar fascia was split.  The  superficial palmar arch identified.  The flexor tendon to the ring and  little finger identified to the ulnar side of the median nerve.  The carpal  retinaculum was incised with sharp dissection.  A right angle and Sewall  retractor were placed between skin and forearm fascia.  The fascia was  released for approximately 1.5 cm proximal to the wrist crease under direct  vision.  The canal was explored.  No further lesions were identified.  An  area of compression to the nerve was apparent.  Tenosynovial tissue was  moderately thickened.  The wound was irrigated.   Skin was closed with  interrupted 5-0 nylon sutures.  A sterile compressive dressing and splint  was applied.  The patient tolerated the procedure well and was taken to the  recovery room for observation in satisfactory condition.  She was discharged  home, to return to the [facility] in 1 week on Vicodin.           ______________________________  Daryll Brod, M.D.     GK/MEDQ  D:  01/02/2006  T:  01/03/2006  Job:  [id number]

## 2011-04-04 ENCOUNTER — Other Ambulatory Visit: Payer: Self-pay | Admitting: Internal Medicine

## 2011-04-04 ENCOUNTER — Ambulatory Visit (INDEPENDENT_AMBULATORY_CARE_PROVIDER_SITE_OTHER)
Admission: RE | Admit: 2011-04-04 | Discharge: 2011-04-04 | Disposition: A | Discharge status: Home / Self Care | Payer: Medicare Other | Source: Ambulatory Visit | Attending: Internal Medicine | Admitting: Internal Medicine

## 2011-04-04 ENCOUNTER — Ambulatory Visit (INDEPENDENT_AMBULATORY_CARE_PROVIDER_SITE_OTHER): Payer: Medicare Other | Admitting: Internal Medicine

## 2011-04-04 ENCOUNTER — Encounter: Payer: Self-pay | Admitting: Internal Medicine

## 2011-04-04 ENCOUNTER — Other Ambulatory Visit (INDEPENDENT_AMBULATORY_CARE_PROVIDER_SITE_OTHER): Payer: Medicare Other

## 2011-04-04 VITALS — BP 108/80 | HR 99 | Temp 98.6°F | Ht 66.0 in | Wt 274.1 lb

## 2011-04-04 DIAGNOSIS — R29898 Other symptoms and signs involving the musculoskeletal system: Secondary | ICD-10-CM

## 2011-04-04 DIAGNOSIS — E119 Type 2 diabetes mellitus without complications: Secondary | ICD-10-CM

## 2011-04-04 DIAGNOSIS — E0590 Thyrotoxicosis, unspecified without thyrotoxic crisis or storm: Secondary | ICD-10-CM | POA: Insufficient documentation

## 2011-04-04 DIAGNOSIS — I6789 Other cerebrovascular disease: Secondary | ICD-10-CM

## 2011-04-04 DIAGNOSIS — M549 Dorsalgia, unspecified: Secondary | ICD-10-CM

## 2011-04-04 DIAGNOSIS — M6281 Muscle weakness (generalized): Secondary | ICD-10-CM

## 2011-04-04 DIAGNOSIS — R109 Unspecified abdominal pain: Secondary | ICD-10-CM

## 2011-04-04 DIAGNOSIS — Z0000 Encounter for general adult medical examination without abnormal findings: Secondary | ICD-10-CM

## 2011-04-04 DIAGNOSIS — Z79899 Other long term (current) drug therapy: Secondary | ICD-10-CM

## 2011-04-04 LAB — HEPATIC FUNCTION PANEL
ALT: 20 U/L (ref 0–35)
AST: 20 U/L (ref 0–37)
Albumin: 3.3 g/dL — ABNORMAL LOW (ref 3.5–5.2)
Alkaline Phosphatase: 76 U/L (ref 39–117)
Bilirubin, Direct: 0.1 mg/dL (ref 0.0–0.3)
Total Bilirubin: 0.6 mg/dL (ref 0.3–1.2)
Total Protein: 6.9 g/dL (ref 6.0–8.3)

## 2011-04-04 LAB — LIPID PANEL
Cholesterol: 157 mg/dL (ref 0–200)
HDL: 60.9 mg/dL (ref >39.00)
LDL Cholesterol: 74 mg/dL (ref 0–99)
Total CHOL/HDL Ratio: 3
Triglycerides: 113 mg/dL (ref 0.0–149.0)
VLDL: 22.6 mg/dL (ref 0.0–40.0)

## 2011-04-04 LAB — CBC WITH DIFFERENTIAL/PLATELET
Basophils Absolute: 0 K/uL (ref 0.0–0.1)
Basophils Relative: 0.3 % (ref 0.0–3.0)
Eosinophils Absolute: 0.4 K/uL (ref 0.0–0.7)
Eosinophils Relative: 5.1 % — ABNORMAL HIGH (ref 0.0–5.0)
HCT: 29 % — ABNORMAL LOW (ref 36.0–46.0)
Hemoglobin: 9.7 g/dL — ABNORMAL LOW (ref 12.0–15.0)
Lymphocytes Relative: 24.3 % (ref 12.0–46.0)
Lymphs Abs: 2.1 K/uL (ref 0.7–4.0)
MCHC: 33.4 g/dL (ref 30.0–36.0)
MCV: 94.7 fl (ref 78.0–100.0)
Monocytes Absolute: 0.5 K/uL (ref 0.1–1.0)
Monocytes Relative: 5.8 % (ref 3.0–12.0)
Neutro Abs: 5.5 K/uL (ref 1.4–7.7)
Neutrophils Relative %: 64.5 % (ref 43.0–77.0)
Platelets: 283 K/uL (ref 150.0–400.0)
RBC: 3.07 Mil/uL — ABNORMAL LOW (ref 3.87–5.11)
RDW: 14.3 % (ref 11.5–14.6)
WBC: 8.6 K/uL (ref 4.5–10.5)

## 2011-04-04 LAB — URINALYSIS, ROUTINE W REFLEX MICROSCOPIC
Hgb urine dipstick: NEGATIVE
Leukocytes, UA: NEGATIVE
Nitrite: NEGATIVE
Specific Gravity, Urine: 1.025 (ref 1.000–1.030)
Urine Glucose: NEGATIVE
Urobilinogen, UA: 1 (ref 0.0–1.0)
pH: 5.5 (ref 5.0–8.0)

## 2011-04-04 LAB — BASIC METABOLIC PANEL
BUN: 19 mg/dL (ref 6–23)
CO2: 27 mEq/L (ref 19–32)
Calcium: 9.2 mg/dL (ref 8.4–10.5)
Chloride: 104 mEq/L (ref 96–112)
Creatinine, Ser: 1 mg/dL (ref 0.4–1.2)
GFR: 70.83 mL/min (ref >60.00)
Glucose, Bld: 119 mg/dL — ABNORMAL HIGH (ref 70–99)
Potassium: 4 mEq/L (ref 3.5–5.1)
Sodium: 139 mEq/L (ref 135–145)

## 2011-04-04 LAB — MICROALBUMIN / CREATININE URINE RATIO
Creatinine,U: 389.5 mg/dL
Microalb Creat Ratio: 0.2 mg/g (ref 0.0–30.0)
Microalb, Ur: 0.6 mg/dL (ref 0.0–1.9)

## 2011-04-04 LAB — TSH: TSH: 0.06 u[IU]/mL — ABNORMAL LOW (ref 0.35–5.50)

## 2011-04-04 LAB — HEMOGLOBIN A1C: Hgb A1c MFr Bld: 6 % (ref 4.6–6.5)

## 2011-04-04 MED ORDER — OXYCODONE-ACETAMINOPHEN 5-325 MG PO TABS: 1.0000 | ORAL_TABLET | Freq: Four times a day (QID) | ORAL | Status: DC | PRN

## 2011-04-04 MED ORDER — LIRAGLUTIDE 18 MG/3ML ~~LOC~~ SOLN: 1.0000 mg | Freq: Every day | SUBCUTANEOUS | Status: DC

## 2011-04-04 MED ORDER — PREDNISONE 10 MG PO TABS: 10.0000 mg | ORAL_TABLET | Freq: Every day | ORAL | Status: AC

## 2011-04-04 MED ORDER — SIMVASTATIN 20 MG PO TABS: 20.0000 mg | ORAL_TABLET | Freq: Every day | ORAL | Status: DC

## 2011-04-04 NOTE — Assessment & Plan Note (Signed)
Ongoing, not clearly radicular pain involved, but with new RLE weakness/numbness and ongoing LBP will need lumbar MRI and referral back to ortho, Dr Berenice Primas has seen for her knees in the past

## 2011-04-04 NOTE — Progress Notes (Signed)
Subjective:    Patient ID: [name], female    DOB: 11/25/1950, 60 y.o.   MRN: [id number]  HPI  Here for wellness and f/u;  Overall doing ok;  Pt denies CP, worsening SOB, DOE, wheezing, orthopnea, PND, worsening LE edema, palpitations, dizziness or syncope.  Pt denies neurological change such as new Headache, facial or extremity weakness.  Pt denies polydipsia, polyuria, or low sugar symptoms. Pt states overall good compliance with treatment and medications, good tolerability, and trying to follow lower cholesterol diet.  Pt denies worsening depressive symptoms, suicidal ideation or panic. No fever, wt loss, night sweats, loss of appetite, or other constitutional symptoms.  Pt states good ability with ADL's, low fall risk, home safety reviewed and adequate, no significant changes in hearing or vision, and occasionally active with exercise.  Also with new onset RLE weakness and numbness for 4 days, with gait problem but no falls.  Pt denies new neurological symptoms such as new headache, or facial or extremity weakness or numbness except for the new issue.  Pt continues to have recurring LBP without change in severity, bowel or bladder change, fever, wt loss;  Pain not clearly radicular per pt.    Also despite good compliacne with mult meds, still have mid and upper abd burning without clear reflux  And Denies worsening reflux, dysphagia, vomiting, bowel change or blood.   Past Medical History  Diagnosis Date  . DIABETES MELLITUS, TYPE II 05/06/2007  . HYPERLIPIDEMIA 05/06/2007  . ANEMIA-NOS 08/13/2007  . ANXIETY 05/06/2007  . INSOMNIA-SLEEP DISORDER-UNSPEC 09/01/2007  . DEPRESSION 09/01/2007  . COMMON MIGRAINE 08/13/2007  . HYPERTENSION 05/06/2007  . CORONARY ARTERY DISEASE 05/06/2007  . GERD 04/26/2009  . ULCER-GASTRIC 03/15/2009  . Gastroparesis 04/26/2009  . DIVERTICULOSIS-COLON 03/15/2009  . CHOLELITHIASIS 05/04/2009  . 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 MENOPAUSAL DISORDER 06/29/2008  .  JOINT EFFUSION, LEFT KNEE 10/12/2007  . Pain in joint, shoulder region 10/12/2007  . CERVICAL RADICULOPATHY, LEFT 10/28/2008  . NAUSEA AND VOMITING 03/15/2009  . NAUSEA 03/15/2009  . Heartburn 03/15/2009  . CHANGE IN BOWELS 09/13/2009  . Dysuria 10/19/2009  . Abdominal pain, left lower quadrant 09/01/2007  . Abdominal pain, epigastric 03/15/2009  . Abdominal pain, generalized 12/21/2007  . ABDOMINAL PAIN OTHER SPECIFIED SITE 09/13/2009  . POSITIVE PPD 08/13/2007  . LACERATION 10/12/2007  . CONTUSIONS, MULTIPLE 10/12/2007  . KNEE REPLACEMENT, LEFT, HX OF 08/13/2007  . Infectious colitis   . Gastroparesis   . Gastric ulcer 04/2008  . Hemorrhoids   . Diverticulosis   . Hypertension   . Hyperlipidemia   . Diabetes mellitus, type II   . GERD (gastroesophageal reflux disease)   . Iron deficiency anemia   . Hiatal hernia   . CAD (coronary artery disease)   . Anxiety   . GI (gastrointestinal bleed)     Hx of    Past Surgical History  Procedure Date  . Left knee replacement   . 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 Berenice Primas  . 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 Rotator cuff repair 01/2009  . Cholecystectomy 06/2009  . S/p bladder surgury 09/2009    Dr. Terance Hart  . Abdominal hysterectomy     reports that she has quit smoking. She has never used smokeless tobacco. She reports that she does not drink alcohol or use illicit drugs. family history includes Coronary artery disease in her other; Diabetes in her brother  and sister; Heart disease in her brother and sister; and Hypertension in her other.  There is no history of Colon cancer. Allergies  Allergen Reactions  . Hydrocodone     REACTION: tachycardia  . Penicillins    Current Outpatient Prescriptions on File Prior to Visit  Medication Sig Dispense Refill  . amLODipine-valsartan (EXFORGE) 10-320 MG per tablet Take 1 tablet by mouth daily.        Marland Kitchen aspirin 81 MG EC tablet Take 81 mg by mouth  daily.        . bifidobacterium infantis (ALIGN) capsule Take 1 capsule by mouth daily.  30 capsule  11  . estradiol (ESTRACE) 1 MG tablet Take 1 mg by mouth daily.        . furosemide (LASIX) 40 MG tablet Take 40 mg by mouth daily.        . isosorbide dinitrate (ISORDIL) 10 MG tablet Take 10 mg by mouth 2 (two) times daily.        . meclizine (ANTIVERT) 12.5 MG tablet Take 1 tablet (12.5 mg total) by mouth 3 (three) times daily as needed.  30 tablet  1  . metFORMIN (GLUCOPHAGE-XR) 500 MG 24 hr tablet TAKE ONE TABLET BY MOUTH TWICE DAILY WITH MEALS  180 tablet  0  . metoprolol (LOPRESSOR) 50 MG tablet Take 50 mg by mouth 2 (two) times daily.        Marland Kitchen omeprazole (PRILOSEC) 40 MG capsule Take 1 capsule (40 mg total) by mouth daily.  30 capsule  11  . promethazine (PHENERGAN) 25 MG tablet Take 1 tablet (25 mg total) by mouth every 6 (six) hours as needed. For nausea  30 tablet  2  . sertraline (ZOLOFT) 50 MG tablet Take 50 mg by mouth daily.        . sitaGLIPtan (JANUVIA) 100 MG tablet Take 100 mg by mouth daily.        . sucralfate (CARAFATE) 1 GM/10ML suspension Take 1 g by mouth. 1 gram three times a day before meals       . DISCONTD: atorvastatin (LIPITOR) 20 MG tablet Take 1 tablet (20 mg total) by mouth daily.  90 tablet  3  . DISCONTD: metFORMIN (GLUMETZA) 500 MG (MOD) 24 hr tablet Take 500 mg by mouth 2 (two) times daily.        Marland Kitchen DISCONTD: oxyCODONE-acetaminophen (PERCOCET) 5-325 MG per tablet Take 1 tablet by mouth every 6 (six) hours as needed for pain.  60 tablet  0  . DISCONTD: simvastatin (ZOCOR) 20 MG tablet Take 20 mg by mouth daily.        Marland Kitchen DISCONTD: insulin detemir (LEVEMIR FLEXPEN) 100 UNIT/ML injection Inject 10 Units into the skin daily.         Review of Systems Review of Systems  Constitutional: Negative for diaphoresis, activity change, appetite change and unexpected weight change.  HENT: Negative for hearing loss, ear pain, facial swelling, mouth sores and neck stiffness.    Eyes: Negative for pain, redness and visual disturbance.  Respiratory: Negative for shortness of breath and wheezing.   Cardiovascular: Negative for chest pain and palpitations.  Gastrointestinal: Negative for diarrhea, blood in stool, abdominal distention and rectal pain.  Genitourinary: Negative for hematuria, flank pain and decreased urine volume.  Musculoskeletal: Negative for myalgias and joint swelling.  Skin: Negative for color change and wound.  Neurological: Negative for syncope. Hematological: Negative for adenopathy.  Psychiatric/Behavioral: Negative for hallucinations, self-injury, decreased concentration and agitation.  Objective:   Physical Exam BP 108/80  Pulse 99  Temp(Src) 98.6 F (37 C) (Oral)  Ht 5\' 6"  (1.676 m)  Wt 274 lb 2 oz (124.342 kg)  BMI 44.24 kg/m2  SpO2 97% Physical Exam  VS noted Constitutional: Pt is oriented to person, place, and time. Appears well-developed and well-nourished.  HENT:  Head: Normocephalic and atraumatic.  Right Ear: External ear normal.  Left Ear: External ear normal.  Nose: Nose normal.  Mouth/Throat: Oropharynx is clear and moist.  Eyes: Conjunctivae and EOM are normal. Pupils are equal, round, and reactive to light.  Neck: Normal range of motion. Neck supple. No JVD present. No tracheal deviation present.  Cardiovascular: Normal rate, regular rhythm, normal heart sounds and intact distal pulses.   Pulmonary/Chest: Effort normal and breath sounds normal.  Abdominal: Soft. Bowel sounds are normal. There is no tenderness.  Musculoskeletal: Normal range of motion. Exhibits no edema. No spine tender except for mid lumbar, same as chronic per pt, no local swelling, erythema, rash Lymphadenopathy:  Has no cervical adenopathy.  Neurological: Pt is alert and oriented to person, place, and time. Pt has normal reflexes. No cranial nerve deficit. Motor 4/5 prox and distal RLE, sens decreased to LT prox and distal RLE, neg SLR bilat,   No reflex right patellar s/p TKA but S1 seems mild diminished compared to left Skin: Skin is warm and dry. No rash noted.  Psychiatric:  Has  normal mood and affect. Behavior is normal.         Assessment & Plan:

## 2011-04-04 NOTE — Assessment & Plan Note (Signed)
With numbness, and not clearly radicular pain from the back;  Will check head CT - r/o CVA as symptoms are approx 57-73 days old

## 2011-04-04 NOTE — Assessment & Plan Note (Signed)
Overall doing well, age appropriate education and counseling updated, referrals for preventative services and immunizations addressed, dietary and smoking counseling addressed, most recent labs and ECG reviewed.  I have personally reviewed and have noted: 1) the patient's medical and social history 2) The pt's use of alcohol, tobacco, and illicit drugs 3) The patient's current medications and supplements 4) Functional ability including ADL's, fall risk, home safety risk, hearing and visual impairment 5) Diet and physical activities 6) Evidence for depression or mood disorder 7) The patient's height, weight, and BMI have been recorded in the chart I have made referrals, and provided counseling and education based on review of the above Declines immunizations today

## 2011-04-04 NOTE — Patient Instructions (Addendum)
Your pain medication was refilled Continue all other medications as before You will be contacted regarding the referral for: CT head - to see PCC's now before leaving to have scheduled You will be contacted regarding the referral for: MRI Lower back, and [facility] (Dr Berenice Primas if possible), and Dr Justin Mend Please go to LAB in the Basement for the blood and/or urine tests to be done today Please call the phone number [phone] (the [place]) for results of testing in 2-3 days;  When calling, simply dial the number, and when prompted enter the MRN number above (the Medical Record Number) and the # key, then the message should start. Please keep your appointments with your specialists as you have planned - Dr Cammy Copa Please return in 6 months, or sooner if needed, with labs done 3-5 days before

## 2011-04-04 NOTE — Assessment & Plan Note (Addendum)
Despite mult meds -for referral to f/u GI as she states has persistent burning to the abdomen

## 2011-04-04 NOTE — Assessment & Plan Note (Signed)
Now sees Dr Dwyane Dee (Dr Electa Sniff retired) with med changes as per EMR;  Check labs and f/u Dr Dwyane Dee as planned

## 2011-04-08 ENCOUNTER — Telehealth: Payer: Self-pay

## 2011-04-08 DIAGNOSIS — M25569 Pain in unspecified knee: Secondary | ICD-10-CM

## 2011-04-08 DIAGNOSIS — M549 Dorsalgia, unspecified: Secondary | ICD-10-CM

## 2011-04-08 NOTE — Telephone Encounter (Signed)
Done per emr 

## 2011-04-08 NOTE — Telephone Encounter (Signed)
Pt called requesting referral to Rheumatology Dr Elpidio Galea at [place] for  Leg and knee pain. Pt cannot see Ortho due to outstanding balance.

## 2011-04-09 ENCOUNTER — Ambulatory Visit: Payer: BC Managed Care – PPO | Admitting: Internal Medicine

## 2011-04-18 ENCOUNTER — Telehealth: Payer: Self-pay

## 2011-04-18 NOTE — Telephone Encounter (Signed)
Please ask pharmacy for closest substitute recommendation, if any

## 2011-04-18 NOTE — Telephone Encounter (Signed)
[facility] called to inform MD that Isosorbide is on manufacture back order for all generic dosages and brand-name. Pt requested a refill of medication but pharmacy requires substitute, please advise.

## 2011-04-18 NOTE — Telephone Encounter (Addendum)
[facility] pharmacist stated that there is no pharmacy recommended substitute. Substitute can be any medication MD advises

## 2011-04-22 MED ORDER — ISOSORBIDE MONONITRATE ER 30 MG PO TB24: 30.0000 mg | ORAL_TABLET | Freq: Every day | ORAL | Status: DC

## 2011-04-22 NOTE — Telephone Encounter (Signed)
Ok to change to isosorbide mononitrate 30 mg - sent to local pharmacy

## 2011-04-29 ENCOUNTER — Encounter: Payer: Self-pay | Admitting: Internal Medicine

## 2011-04-29 ENCOUNTER — Ambulatory Visit (INDEPENDENT_AMBULATORY_CARE_PROVIDER_SITE_OTHER): Payer: Medicare Other | Admitting: Internal Medicine

## 2011-04-29 VITALS — BP 150/82 | HR 104 | Temp 98.6°F | Ht 66.0 in

## 2011-04-29 DIAGNOSIS — H9209 Otalgia, unspecified ear: Secondary | ICD-10-CM

## 2011-04-29 DIAGNOSIS — H9202 Otalgia, left ear: Secondary | ICD-10-CM

## 2011-04-29 MED ORDER — GABAPENTIN 300 MG PO CAPS: 300.0000 mg | ORAL_CAPSULE | Freq: Every day | ORAL | Status: DC

## 2011-04-29 MED ORDER — CIPROFLOXACIN-HYDROCORTISONE 0.2-1 % OT SUSP: 4.0000 [drp] | Freq: Two times a day (BID) | OTIC | Status: AC

## 2011-04-29 NOTE — Patient Instructions (Signed)
It was good to see you today. Start ear drops and gabapentin (in addition to Percocet) for left ear pain -  Your prescription(s) have been submitted to your pharmacy. Please take as directed and contact our office if you believe you are having problem(s) with the medication(s). If continued pain despite these medications, call for referral to ENT specialist as needed

## 2011-04-29 NOTE — Progress Notes (Signed)
  Subjective:    Patient ID: [name], female    DOB: Apr 13, 1951, 60 y.o.   MRN: [id number]  HPI  complains of left ear pain Onset 4-6 weeks ago - Describes pain as severe 9-10/10, constant - "i'm ready to cut it off" Denies precipitating trauma - no rash, fever, drainage or change in hearing ?mild swelling - tender to touch ear lobe or touch behind left ear No hx same - Recent head ct 04/04/11 unremarkable (report reviewed)  Past Medical History  Diagnosis Date  . COMMON MIGRAINE   . DIVERTICULOSIS-COLON   . CORONARY ARTERY DISEASE   . Gastroparesis   . INSOMNIA-SLEEP DISORDER-UNSPEC   . Iron deficiency anemia   . Anxiety   . DEPRESSION   . Diabetes mellitus, type II   . GERD (gastroesophageal reflux disease)   . Hyperlipidemia   . Hypertension   . Gastric ulcer 04/2008  . CERVICAL RADICULOPATHY, LEFT   . Hiatal hernia     Review of Systems  Constitutional: Negative for unexpected weight change.  HENT: Negative for congestion, sore throat, mouth sores, trouble swallowing, dental problem, sinus pressure and tinnitus.   Eyes: Negative for pain and visual disturbance.       Objective:   Physical Exam BP 150/82  Pulse 104  Temp(Src) 98.6 F (37 C) (Oral)  Ht 5\' 6"  (1.676 m)  SpO2 96%  Constitutional: She is oriented to person, place, and time. She appears well-developed and well-nourished. Uncomfortable holding L ear - spouse at side HENT: Head: Normocephalic and atraumatic. Ears; B TMs ok, no erythema or effusion; L canal with mild erythema - no exudate or fungal growth Mouth/Throat: Upper dentures in place, intact - no erythema or ulceration; Oropharynx is clear and moist. No oropharyngeal exudate.  Eyes: Conjunctivae and EOM are normal. Pupils are equal, round, and reactive to light. No scleral icterus.  Neck: Normal range of motion, nontender to palp along left neck. Neck supple. No JVD present. No thyromegaly present. no carotid bruit Cardiovascular: Normal  rate, regular rhythm and normal heart sounds.  No murmur heard. chronic 1+ BLE edema. Pulmonary/Chest: Effort normal and breath sounds normal. No respiratory distress. She has no wheezes.  Neurological: She is alert and oriented to person, place, and time. No cranial nerve deficit. Coordination normal.  Psychiatric: She has a normal mood and affect. Her behavior is normal. Judgment and thought content normal.   Lab Results  Component Value Date   WBC 8.6 04/04/2011   HGB 9.7* 04/04/2011   HCT 29.0* 04/04/2011   PLT 283.0 04/04/2011   CHOL 157 04/04/2011   TRIG 113.0 04/04/2011   HDL 60.90 04/04/2011   LDLDIRECT 156.6 01/07/2011   ALT 20 04/04/2011   AST 20 04/04/2011   NA 139 04/04/2011   K 4.0 04/04/2011   CL 104 04/04/2011   CREATININE 1.0 04/04/2011   BUN 19 04/04/2011   CO2 27 04/04/2011   TSH 0.06* 04/04/2011   INR 1.25 12/08/2010   HGBA1C 6.0 04/04/2011   MICROALBUR 0.6 04/04/2011        Assessment & Plan:  L ear pain - no appreciable otitis externa or media on exam, no evidence for dental problems - ?trigeminal neuropathy - recent head ct 76/28/12 reviewed - empiric antibiotics drops as pt reports improvement on same (remotely) and start gabapentin for neuropathic pain - erx done - continue percocet as needed and consider refer to ENT if unimproved - pt agrees to let us know

## 2011-05-07 ENCOUNTER — Telehealth: Payer: Self-pay

## 2011-05-07 DIAGNOSIS — H9202 Otalgia, left ear: Secondary | ICD-10-CM

## 2011-05-07 NOTE — Telephone Encounter (Signed)
Pt called requesting referral to ENT per last OV with VAL

## 2011-05-07 NOTE — Telephone Encounter (Signed)
Referral done per emr 

## 2011-05-08 NOTE — Telephone Encounter (Signed)
Pt advised.

## 2011-05-09 ENCOUNTER — Encounter: Payer: Self-pay | Admitting: Gastroenterology

## 2011-05-09 ENCOUNTER — Ambulatory Visit (INDEPENDENT_AMBULATORY_CARE_PROVIDER_SITE_OTHER): Payer: Medicare Other | Admitting: Gastroenterology

## 2011-05-09 VITALS — BP 122/74 | HR 104 | Ht 66.0 in | Wt 275.0 lb

## 2011-05-09 DIAGNOSIS — K3184 Gastroparesis: Secondary | ICD-10-CM

## 2011-05-09 DIAGNOSIS — K5900 Constipation, unspecified: Secondary | ICD-10-CM

## 2011-05-09 NOTE — Progress Notes (Signed)
History of Present Illness: This is a 60 year old female who gastroparesis and relates problems with frequent nausea, intermittent vomiting, bloating, mid abdominal discomfort and constipation. She states she 4-5 days between bowel movements. She takes MiraLax and Ex-Lax infrequently. Colonoscopy in December 2008 showed mild diverticulosis and hemorrhoids.  Current Medications, Allergies, Past Medical History, Past Surgical History, Family History and Social History were reviewed in [facility] record.  Physical Exam: General: Well developed , well nourished, no acute distress Head: Normocephalic and atraumatic Eyes:  sclerae anicteric, EOMI Ears: Normal auditory acuity Mouth: No deformity or lesions Lungs: Clear throughout to auscultation Heart: Regular rate and rhythm; no murmurs, rubs or bruits Abdomen: Soft, non tender and non distended. No masses, hepatosplenomegaly or hernias noted. Normal Bowel sounds Musculoskeletal: Symmetrical with no gross deformities  Pulses:  Normal pulses noted Extremities: No clubbing, cyanosis, edema or deformities noted Neurological: Alert oriented x 4, grossly nonfocal Psychological:  Alert and cooperative. Normal mood and affect  Assessment and Recommendations:  1. Gastroparesis. 5 small meals daily avoiding high fat and high fiber foods. Treat constipation more effectively with regular laxative usage. Consider a promotility agent if these measures are not effective.  2. Chronic constipation. Begin MiraLax 1-3 times daily on a regular basis titrated for a bowel movements every day or every other day.  3. Colorectal cancer screening. Screening colonoscopy recommended December 2018.

## 2011-05-09 NOTE — Patient Instructions (Signed)
Gastroparesis diet given. Please have 5 small meals a day. Miralax 1-3 times daily you may adjust this dose if your bowels are not regular. Follow up as needed.

## 2011-05-17 ENCOUNTER — Other Ambulatory Visit: Payer: Self-pay | Admitting: Internal Medicine

## 2011-05-22 ENCOUNTER — Other Ambulatory Visit: Payer: Self-pay

## 2011-05-22 MED ORDER — ESTRADIOL 1 MG PO TABS: 1.0000 mg | ORAL_TABLET | Freq: Every day | ORAL | Status: DC

## 2011-05-28 ENCOUNTER — Other Ambulatory Visit: Payer: Self-pay | Admitting: Otolaryngology

## 2011-05-28 DIAGNOSIS — M279 Disease of jaws, unspecified: Secondary | ICD-10-CM

## 2011-05-28 DIAGNOSIS — H9209 Otalgia, unspecified ear: Secondary | ICD-10-CM

## 2011-06-05 ENCOUNTER — Ambulatory Visit
Admission: RE | Admit: 2011-06-05 | Discharge: 2011-06-05 | Disposition: A | Discharge status: Home / Self Care | Payer: Medicare Other | Source: Ambulatory Visit | Attending: Otolaryngology | Admitting: Otolaryngology

## 2011-06-05 DIAGNOSIS — M279 Disease of jaws, unspecified: Secondary | ICD-10-CM

## 2011-06-05 DIAGNOSIS — H9209 Otalgia, unspecified ear: Secondary | ICD-10-CM

## 2011-06-07 ENCOUNTER — Telehealth: Payer: Self-pay | Admitting: Gastroenterology

## 2011-06-07 MED ORDER — METOCLOPRAMIDE HCL 10 MG PO TABS: ORAL_TABLET | ORAL | Status: DC

## 2011-06-07 MED ORDER — PROMETHAZINE HCL 25 MG PO TABS: ORAL_TABLET | ORAL | Status: DC

## 2011-06-07 NOTE — Telephone Encounter (Signed)
Pt states she started having diarrhea and nausea/vomiting yesterday. She has taken some Pepto bismol for the diarrhea and phenergan for the nausea but it has not helped. She is also c/o abdominal pain all across her lower abdomen below her belly button. Pt states she cannot really keep any food down. Pt is requesting something for her nausea and abdominal pain. Please advise.

## 2011-06-07 NOTE — Telephone Encounter (Signed)
Phenergan 25 mg tablets, 1/2 to 1 po q6h prn N/V, #24, 1 refill Reglan 10 mg ac and hs, #120, no refills Clear liquids for 24 hours then advance as tolerated

## 2011-06-07 NOTE — Telephone Encounter (Signed)
Spoke with patient and gave her Dr. Lynne Leader recommendations. Rx sent to [facility] on [address].

## 2011-06-17 ENCOUNTER — Encounter: Payer: Self-pay | Admitting: Internal Medicine

## 2011-06-17 ENCOUNTER — Other Ambulatory Visit (INDEPENDENT_AMBULATORY_CARE_PROVIDER_SITE_OTHER): Payer: BC Managed Care – PPO

## 2011-06-17 ENCOUNTER — Ambulatory Visit (INDEPENDENT_AMBULATORY_CARE_PROVIDER_SITE_OTHER): Payer: BC Managed Care – PPO | Admitting: Internal Medicine

## 2011-06-17 DIAGNOSIS — E0590 Thyrotoxicosis, unspecified without thyrotoxic crisis or storm: Secondary | ICD-10-CM

## 2011-06-17 DIAGNOSIS — I10 Essential (primary) hypertension: Secondary | ICD-10-CM

## 2011-06-17 DIAGNOSIS — D649 Anemia, unspecified: Secondary | ICD-10-CM

## 2011-06-17 LAB — CBC WITH DIFFERENTIAL/PLATELET
Basophils Absolute: 0.1 10*3/uL (ref 0.0–0.1)
Basophils Relative: 0.8 % (ref 0.0–3.0)
Eosinophils Absolute: 0.1 10*3/uL (ref 0.0–0.7)
Eosinophils Relative: 0.9 % (ref 0.0–5.0)
HCT: 30.7 % — ABNORMAL LOW (ref 36.0–46.0)
Hemoglobin: 10.1 g/dL — ABNORMAL LOW (ref 12.0–15.0)
Lymphocytes Relative: 15.4 % (ref 12.0–46.0)
Lymphs Abs: 1.7 10*3/uL (ref 0.7–4.0)
MCHC: 32.8 g/dL (ref 30.0–36.0)
MCV: 91.4 fl (ref 78.0–100.0)
Monocytes Absolute: 0.3 10*3/uL (ref 0.1–1.0)
Monocytes Relative: 3.1 % (ref 3.0–12.0)
Neutro Abs: 8.8 10*3/uL — ABNORMAL HIGH (ref 1.4–7.7)
Neutrophils Relative %: 79.8 % — ABNORMAL HIGH (ref 43.0–77.0)
Platelets: 299 10*3/uL (ref 150.0–400.0)
RBC: 3.36 Mil/uL — ABNORMAL LOW (ref 3.87–5.11)
RDW: 13.8 % (ref 11.5–14.6)
WBC: 11 10*3/uL — ABNORMAL HIGH (ref 4.5–10.5)

## 2011-06-17 MED ORDER — METOPROLOL TARTRATE 50 MG PO TABS: 75.0000 mg | ORAL_TABLET | Freq: Two times a day (BID) | ORAL | Status: DC

## 2011-06-17 NOTE — Assessment & Plan Note (Signed)
Elevated blood pressure with tachycardia - ?thyroid - see next Increase beta-blocker and recheck labs follow up 2 weeks  BP Readings from Last 3 Encounters:  06/17/11 152/82  05/09/11 122/74  04/29/11 150/82

## 2011-06-17 NOTE — Patient Instructions (Signed)
It was good to see you today. Test(s) ordered today. Your results will be called to you after review (48-72hours after test completion). If any changes need to be made, you will be notified at that time. Increase metoprolol to 1 extra pill daily (take 1.5 pills 2x/day for 75mg  2x/day dose) Your prescription(s) have been submitted to your pharmacy. Please take as directed and contact our office if you believe you are having problem(s) with the medication(s). Please schedule followup in 1-2 weeks, call sooner if problems.

## 2011-06-17 NOTE — Progress Notes (Signed)
  Subjective:    Patient ID: [name], female    DOB: Nov 29, 1950, 60 y.o.   MRN: [id number]  HPI  complains of elevation of blood pressure  Onset 1 week ago Denies change in meds Not associated with chest pain, edema  Past Medical History  Diagnosis Date  . COMMON MIGRAINE   . DIVERTICULOSIS-COLON   . CORONARY ARTERY DISEASE   . Gastroparesis   . INSOMNIA-SLEEP DISORDER-UNSPEC   . Iron deficiency anemia   . Anxiety   . DEPRESSION   . Diabetes mellitus, type II   . GERD (gastroesophageal reflux disease)   . Hyperlipidemia   . Hypertension   . Gastric ulcer 04/2008  . CERVICAL RADICULOPATHY, LEFT   . Hiatal hernia     Review of Systems  Constitutional: Negative for fever.  Respiratory: Negative for shortness of breath.   Cardiovascular: Positive for palpitations. Negative for leg swelling.  Neurological: Negative for headaches.       Objective:   Physical Exam BP 152/82  Pulse 106  Temp(Src) 98.7 F (37.1 C) (Oral)  Ht 5\' 6"  (1.676 m)  SpO2 98% Constitutional:  She appears well-developed and well-nourished. No distress. spouse at side Eyes: Conjunctivae and EOM are normal. Pupils are equal, round, and reactive to light. No scleral icterus.  Neck: Normal range of motion. Neck supple. No JVD present. No thyromegaly present.  Cardiovascular: slightly tachy rate, regular rhythm and normal heart sounds.  No murmur heard. No BLE edema. Pulmonary/Chest: Effort normal and breath sounds normal. No respiratory distress. She has no wheezes.  Neurological: She is alert and oriented to person, place, and time. No cranial nerve deficit. Coordination normal.  Psychiatric: She has a normal mood and affect. Her behavior is normal. Judgment and thought content normal.  Lab Results  Component Value Date   WBC 8.6 04/04/2011   HGB 9.7* 04/04/2011   HCT 29.0* 04/04/2011   PLT 283.0 04/04/2011   CHOL 157 04/04/2011   TRIG 113.0 04/04/2011   HDL 60.90 04/04/2011   LDLDIRECT 156.6  01/07/2011   ALT 20 04/04/2011   AST 20 04/04/2011   NA 139 04/04/2011   K 4.0 04/04/2011   CL 104 04/04/2011   CREATININE 1.0 04/04/2011   BUN 19 04/04/2011   CO2 27 04/04/2011   TSH 0.06* 04/04/2011   INR 1.25 12/08/2010   HGBA1C 6.0 04/04/2011   MICROALBUR 0.6 04/04/2011       Assessment & Plan:  See problem list. Medications and labs reviewed today.

## 2011-06-17 NOTE — Assessment & Plan Note (Signed)
Lab Results  Component Value Date   TSH 0.06* 04/04/2011  manifest with palpitations Recheck now with FT4 - refer for eval tx if dx confirmed

## 2011-06-17 NOTE — Assessment & Plan Note (Signed)
Working with GI - no hx to suggest active blood loss but recheck today  Lab Results  Component Value Date   HGB 9.7* 04/04/2011

## 2011-06-18 ENCOUNTER — Other Ambulatory Visit: Payer: Self-pay | Admitting: Internal Medicine

## 2011-06-18 ENCOUNTER — Telehealth: Payer: Self-pay | Admitting: *Deleted

## 2011-06-18 DIAGNOSIS — E0590 Thyrotoxicosis, unspecified without thyrotoxic crisis or storm: Secondary | ICD-10-CM

## 2011-06-18 LAB — T4, FREE: Free T4: 2.19 ng/dL — ABNORMAL HIGH (ref 0.60–1.60)

## 2011-06-18 LAB — TSH: TSH: 0.04 u[IU]/mL — ABNORMAL LOW (ref 0.35–5.50)

## 2011-06-18 NOTE — Telephone Encounter (Signed)
i cannot recall how to do this but i know sara or jilda can show [place], please ask one of them to help with this - thanks- (refer order was entered under "order only" today 9/11)

## 2011-06-18 NOTE — Telephone Encounter (Signed)
Called pt concerning lab results. Pt states she see Dr. Dwyane Dee will call him for f/u appt. Need refeeral removed.Marland KitchenMarland KitchenMarland Kitchen9/11/12@4 :14pm/LMB

## 2011-06-19 NOTE — Telephone Encounter (Signed)
Cancel endo referral..Marland Kitchen9/12/12@8 :27am/LMB

## 2011-06-24 ENCOUNTER — Other Ambulatory Visit (HOSPITAL_COMMUNITY): Payer: Self-pay | Admitting: Endocrinology

## 2011-06-24 DIAGNOSIS — E0590 Thyrotoxicosis, unspecified without thyrotoxic crisis or storm: Secondary | ICD-10-CM

## 2011-06-27 LAB — HEPATIC FUNCTION PANEL
ALT: 14
AST: 16
Albumin: 3.4 — ABNORMAL LOW
Alkaline Phosphatase: 105
Bilirubin, Direct: 0.2
Indirect Bilirubin: 0.4
Total Bilirubin: 0.6
Total Protein: 7.1

## 2011-06-27 LAB — BASIC METABOLIC PANEL
BUN: 19
CO2: 25
Calcium: 9.2
Chloride: 102
Creatinine, Ser: 1.08
GFR calc Af Amer: >60
GFR calc non Af Amer: 52 — ABNORMAL LOW
Glucose, Bld: 118 — ABNORMAL HIGH
Potassium: 4.3
Sodium: 136

## 2011-06-27 LAB — TSH: TSH: 1.377

## 2011-06-27 LAB — LIPID PANEL
Cholesterol: 168
HDL: 44
LDL Cholesterol: 104 — ABNORMAL HIGH
Total CHOL/HDL Ratio: 3.8
Triglycerides: 101
VLDL: 20

## 2011-06-27 LAB — HEMOGLOBIN A1C
Hgb A1c MFr Bld: 6.7 — ABNORMAL HIGH
Mean Plasma Glucose: 161

## 2011-07-01 ENCOUNTER — Ambulatory Visit: Payer: BC Managed Care – PPO | Admitting: Internal Medicine

## 2011-07-01 ENCOUNTER — Encounter: Payer: Self-pay | Admitting: Internal Medicine

## 2011-07-01 ENCOUNTER — Ambulatory Visit (INDEPENDENT_AMBULATORY_CARE_PROVIDER_SITE_OTHER): Payer: BC Managed Care – PPO | Admitting: Internal Medicine

## 2011-07-01 VITALS — BP 130/62 | HR 100 | Temp 98.2°F | Ht 66.0 in | Wt 263.1 lb

## 2011-07-01 DIAGNOSIS — Z23 Encounter for immunization: Secondary | ICD-10-CM

## 2011-07-01 DIAGNOSIS — E119 Type 2 diabetes mellitus without complications: Secondary | ICD-10-CM

## 2011-07-01 DIAGNOSIS — F3289 Other specified depressive episodes: Secondary | ICD-10-CM

## 2011-07-01 DIAGNOSIS — F329 Major depressive disorder, single episode, unspecified: Secondary | ICD-10-CM

## 2011-07-01 DIAGNOSIS — I10 Essential (primary) hypertension: Secondary | ICD-10-CM

## 2011-07-01 DIAGNOSIS — E785 Hyperlipidemia, unspecified: Secondary | ICD-10-CM

## 2011-07-01 LAB — URINE MICROSCOPIC-ADD ON

## 2011-07-01 LAB — DIFFERENTIAL
Basophils Absolute: 0
Basophils Absolute: 0
Basophils Absolute: 0.1
Basophils Relative: 0
Basophils Relative: 0
Basophils Relative: 1
Eosinophils Absolute: 0.4
Eosinophils Absolute: 0.5
Eosinophils Absolute: 0.4
Eosinophils Relative: 3
Eosinophils Relative: 5
Eosinophils Relative: 3
Lymphocytes Relative: 11 — ABNORMAL LOW
Lymphocytes Relative: 27
Lymphocytes Relative: 18
Lymphs Abs: 1.4
Lymphs Abs: 2.8
Lymphs Abs: 2.3
Monocytes Absolute: 0.5
Monocytes Absolute: 0.6
Monocytes Absolute: 0.7
Monocytes Relative: 4
Monocytes Relative: 6
Monocytes Relative: 6
Neutro Abs: 6.4
Neutro Abs: 9.7 — ABNORMAL HIGH
Neutro Abs: 8.9 — ABNORMAL HIGH
Neutrophils Relative %: 62
Neutrophils Relative %: 81 — ABNORMAL HIGH
Neutrophils Relative %: 72

## 2011-07-01 LAB — COMPREHENSIVE METABOLIC PANEL
ALT: 22
ALT: 29
ALT: 41 — ABNORMAL HIGH
ALT: 46 — ABNORMAL HIGH
AST: 17
AST: 26
AST: 32
AST: 44 — ABNORMAL HIGH
Albumin: 2.6 — ABNORMAL LOW
Albumin: 2.9 — ABNORMAL LOW
Albumin: 2.5 — ABNORMAL LOW
Albumin: 2.6 — ABNORMAL LOW
Alkaline Phosphatase: 70
Alkaline Phosphatase: 86
Alkaline Phosphatase: 111
Alkaline Phosphatase: 113
BUN: 6
BUN: 7
BUN: 14
BUN: 9
CO2: 21
CO2: 23
CO2: 24
CO2: 24
Calcium: 9.1
Calcium: 9.4
Calcium: 8.6
Calcium: 9.4
Chloride: 105
Chloride: 105
Chloride: 106
Chloride: 110
Creatinine, Ser: 0.92
Creatinine, Ser: 1.02
Creatinine, Ser: 0.79
Creatinine, Ser: 0.85
GFR calc Af Amer: >60
GFR calc Af Amer: >60
GFR calc Af Amer: >60
GFR calc Af Amer: >60
GFR calc non Af Amer: 56 — ABNORMAL LOW
GFR calc non Af Amer: >60
GFR calc non Af Amer: >60
GFR calc non Af Amer: >60
Glucose, Bld: 208 — ABNORMAL HIGH
Glucose, Bld: 88
Glucose, Bld: 107 — ABNORMAL HIGH
Glucose, Bld: 88
Potassium: 4.1
Potassium: 4.5
Potassium: 3.9
Potassium: 4.3
Sodium: 135
Sodium: 136
Sodium: 140
Sodium: 141
Total Bilirubin: 1.2
Total Bilirubin: 1.4 — ABNORMAL HIGH
Total Bilirubin: 1.1
Total Bilirubin: 1.4 — ABNORMAL HIGH
Total Protein: 6.2
Total Protein: 7.2
Total Protein: 6.2
Total Protein: 6.5

## 2011-07-01 LAB — URINALYSIS, ROUTINE W REFLEX MICROSCOPIC
Bilirubin Urine: NEGATIVE
Glucose, UA: NEGATIVE
Hgb urine dipstick: NEGATIVE
Ketones, ur: NEGATIVE
Leukocytes, UA: NEGATIVE
Nitrite: NEGATIVE
Protein, ur: 30 — AB
Specific Gravity, Urine: 1.025
Urobilinogen, UA: 0.2
pH: 8.5 — ABNORMAL HIGH

## 2011-07-01 LAB — BASIC METABOLIC PANEL
BUN: 13
BUN: 22
BUN: 23
BUN: 23
BUN: 10
BUN: 22
CO2: 23
CO2: 24
CO2: 25
CO2: 26
CO2: 25
CO2: 26
Calcium: 8.7
Calcium: 9.1
Calcium: 9.4
Calcium: 9.5
Calcium: 9
Calcium: 9.1
Chloride: 105
Chloride: 106
Chloride: 106
Chloride: 109
Chloride: 108
Chloride: 109
Creatinine, Ser: 0.86
Creatinine, Ser: 0.9
Creatinine, Ser: 0.93
Creatinine, Ser: 1.22 — ABNORMAL HIGH
Creatinine, Ser: 0.83
Creatinine, Ser: 0.86
GFR calc Af Amer: 55 — ABNORMAL LOW
GFR calc Af Amer: >60
GFR calc Af Amer: >60
GFR calc Af Amer: >60
GFR calc Af Amer: >60
GFR calc Af Amer: >60
GFR calc non Af Amer: 45 — ABNORMAL LOW
GFR calc non Af Amer: >60
GFR calc non Af Amer: >60
GFR calc non Af Amer: >60
GFR calc non Af Amer: >60
GFR calc non Af Amer: >60
Glucose, Bld: 142 — ABNORMAL HIGH
Glucose, Bld: 183 — ABNORMAL HIGH
Glucose, Bld: 205 — ABNORMAL HIGH
Glucose, Bld: 90
Glucose, Bld: 159 — ABNORMAL HIGH
Glucose, Bld: 55 — ABNORMAL LOW
Potassium: 4.1
Potassium: 4.1
Potassium: 4.1
Potassium: 4.1
Potassium: 4.1
Potassium: 4.4
Sodium: 134 — ABNORMAL LOW
Sodium: 138
Sodium: 138
Sodium: 141
Sodium: 139
Sodium: 141

## 2011-07-01 LAB — CBC
HCT: 21.8 — ABNORMAL LOW
HCT: 26.3 — ABNORMAL LOW
HCT: 27.7 — ABNORMAL LOW
HCT: 21.1 — ABNORMAL LOW
HCT: 21.5 — ABNORMAL LOW
HCT: 22 — ABNORMAL LOW
HCT: 22.3 — ABNORMAL LOW
Hemoglobin: 7.4 — CL
Hemoglobin: 8.9 — ABNORMAL LOW
Hemoglobin: 9.4 — ABNORMAL LOW
Hemoglobin: 7.2 — CL
Hemoglobin: 7.2 — CL
Hemoglobin: 7.4 — CL
Hemoglobin: 7.6 — CL
MCHC: 33.9
MCHC: 33.9
MCHC: 34.1
MCHC: 33.4
MCHC: 33.5
MCHC: 34
MCHC: 34.1
MCV: 88.6
MCV: 88.7
MCV: 89.8
MCV: 88.9
MCV: 89.5
MCV: 91
MCV: 91.4
Platelets: 248
Platelets: 303
Platelets: 305
Platelets: 345
Platelets: 380
Platelets: 413 — ABNORMAL HIGH
Platelets: 414 — ABNORMAL HIGH
RBC: 2.42 — ABNORMAL LOW
RBC: 2.97 — ABNORMAL LOW
RBC: 3.13 — ABNORMAL LOW
RBC: 2.35 — ABNORMAL LOW
RBC: 2.35 — ABNORMAL LOW
RBC: 2.45 — ABNORMAL LOW
RBC: 2.48 — ABNORMAL LOW
RDW: 14.4
RDW: 14.5
RDW: 14.8
RDW: 14.7
RDW: 14.8
RDW: 15.3
RDW: 15.4
WBC: 10.3
WBC: 11.8 — ABNORMAL HIGH
WBC: 12 — ABNORMAL HIGH
WBC: 10.4
WBC: 10.8 — ABNORMAL HIGH
WBC: 12.5 — ABNORMAL HIGH
WBC: 14.9 — ABNORMAL HIGH

## 2011-07-01 LAB — MAGNESIUM
Magnesium: 1.6
Magnesium: 1.8
Magnesium: 2
Magnesium: 2.1

## 2011-07-01 LAB — CHOLESTEROL, TOTAL
Cholesterol: 154
Cholesterol: 136

## 2011-07-01 LAB — TRIGLYCERIDES
Triglycerides: 86
Triglycerides: 112

## 2011-07-01 LAB — HEMOGLOBIN A1C
Hgb A1c MFr Bld: 6.3 — ABNORMAL HIGH
Mean Plasma Glucose: 147

## 2011-07-01 LAB — HEMOGLOBIN AND HEMATOCRIT, BLOOD
HCT: 22.3 — ABNORMAL LOW
HCT: 34.2 — ABNORMAL LOW
Hemoglobin: 11.6 — ABNORMAL LOW
Hemoglobin: 7.6 — CL

## 2011-07-01 LAB — TYPE AND SCREEN
ABO/RH(D): B NEG
Antibody Screen: NEGATIVE

## 2011-07-01 LAB — PHOSPHORUS
Phosphorus: 3.5
Phosphorus: 3.8
Phosphorus: 3.7
Phosphorus: 3.8

## 2011-07-01 LAB — PREALBUMIN
Prealbumin: 8.6 — ABNORMAL LOW
Prealbumin: 14 — ABNORMAL LOW

## 2011-07-01 LAB — ABO/RH: ABO/RH(D): B NEG

## 2011-07-01 NOTE — Patient Instructions (Signed)
We will fax most recent labs to DR [place] today Continue all other medications as before

## 2011-07-04 ENCOUNTER — Ambulatory Visit (HOSPITAL_COMMUNITY): Payer: BC Managed Care – PPO

## 2011-07-05 ENCOUNTER — Ambulatory Visit (INDEPENDENT_AMBULATORY_CARE_PROVIDER_SITE_OTHER)
Admission: RE | Admit: 2011-07-05 | Discharge: 2011-07-05 | Disposition: A | Discharge status: Home / Self Care | Payer: BC Managed Care – PPO | Source: Ambulatory Visit | Attending: Internal Medicine | Admitting: Internal Medicine

## 2011-07-05 ENCOUNTER — Other Ambulatory Visit: Payer: Self-pay | Admitting: Internal Medicine

## 2011-07-05 ENCOUNTER — Other Ambulatory Visit (HOSPITAL_COMMUNITY): Payer: BC Managed Care – PPO

## 2011-07-05 DIAGNOSIS — M79609 Pain in unspecified limb: Secondary | ICD-10-CM

## 2011-07-05 DIAGNOSIS — M79674 Pain in right toe(s): Secondary | ICD-10-CM

## 2011-07-05 DIAGNOSIS — S92911A Unspecified fracture of right toe(s), initial encounter for closed fracture: Secondary | ICD-10-CM

## 2011-07-05 LAB — BASIC METABOLIC PANEL
BUN: 11
BUN: 12
BUN: 18
CO2: 23
CO2: 23
CO2: 26
Calcium: 8.5
Calcium: 8.9
Calcium: 9.1
Chloride: 102
Chloride: 102
Chloride: 105
Creatinine, Ser: 1.03
Creatinine, Ser: 1.04
Creatinine, Ser: 1.07
GFR calc Af Amer: >60
GFR calc Af Amer: >60
GFR calc Af Amer: >60
GFR calc non Af Amer: 53 — ABNORMAL LOW
GFR calc non Af Amer: 54 — ABNORMAL LOW
GFR calc non Af Amer: 55 — ABNORMAL LOW
Glucose, Bld: 163 — ABNORMAL HIGH
Glucose, Bld: 181 — ABNORMAL HIGH
Glucose, Bld: 210 — ABNORMAL HIGH
Potassium: 4
Potassium: 4.3
Potassium: 4.5
Sodium: 133 — ABNORMAL LOW
Sodium: 137
Sodium: 141

## 2011-07-05 LAB — DIFFERENTIAL
Basophils Absolute: 0.1
Basophils Relative: 1
Eosinophils Absolute: 0.2
Eosinophils Relative: 2
Lymphocytes Relative: 30
Lymphs Abs: 3.1
Monocytes Absolute: 0.4
Monocytes Relative: 4
Neutro Abs: 6.6
Neutrophils Relative %: 64

## 2011-07-05 LAB — URINALYSIS, ROUTINE W REFLEX MICROSCOPIC
Bilirubin Urine: NEGATIVE
Glucose, UA: NEGATIVE
Hgb urine dipstick: NEGATIVE
Ketones, ur: NEGATIVE
Nitrite: NEGATIVE
Protein, ur: NEGATIVE
Specific Gravity, Urine: 1.026
Urobilinogen, UA: 1
pH: 6

## 2011-07-05 LAB — COMPREHENSIVE METABOLIC PANEL
ALT: 20
AST: 20
Albumin: 3.9
Alkaline Phosphatase: 106
BUN: 17
CO2: 23
Calcium: 10
Chloride: 106
Creatinine, Ser: 0.97
GFR calc Af Amer: >60
GFR calc non Af Amer: 59 — ABNORMAL LOW
Glucose, Bld: 132 — ABNORMAL HIGH
Potassium: 4.3
Sodium: 140
Total Bilirubin: 0.9
Total Protein: 7.5

## 2011-07-05 LAB — CBC
HCT: 22.5 — ABNORMAL LOW
HCT: 28.4 — ABNORMAL LOW
HCT: 28.9 — ABNORMAL LOW
HCT: 33.8 — ABNORMAL LOW
Hemoglobin: 11.1 — ABNORMAL LOW
Hemoglobin: 7.6 — CL
Hemoglobin: 9.5 — ABNORMAL LOW
Hemoglobin: 9.6 — ABNORMAL LOW
MCHC: 32.9
MCHC: 33.2
MCHC: 33.5
MCHC: 33.6
MCV: 93
MCV: 93
MCV: 93.4
MCV: 93.9
Platelets: 193
Platelets: 205
Platelets: 233
Platelets: 302
RBC: 2.41 — ABNORMAL LOW
RBC: 3.05 — ABNORMAL LOW
RBC: 3.11 — ABNORMAL LOW
RBC: 3.6 — ABNORMAL LOW
RDW: 13.7
RDW: 14.2
RDW: 14.2
RDW: 14.3
WBC: 10.4
WBC: 11.6 — ABNORMAL HIGH
WBC: 11.9 — ABNORMAL HIGH
WBC: 9.6

## 2011-07-05 LAB — TYPE AND SCREEN
ABO/RH(D): B NEG
Antibody Screen: NEGATIVE

## 2011-07-05 LAB — PROTIME-INR
INR: 1.1
INR: 1.3
INR: 1.5
INR: 2.1 — ABNORMAL HIGH
INR: 3 — ABNORMAL HIGH
Prothrombin Time: 14.9
Prothrombin Time: 16.6 — ABNORMAL HIGH
Prothrombin Time: 18.8 — ABNORMAL HIGH
Prothrombin Time: 24.9 — ABNORMAL HIGH
Prothrombin Time: 33 — ABNORMAL HIGH

## 2011-07-05 LAB — ANAEROBIC CULTURE

## 2011-07-05 LAB — PREPARE RBC (CROSSMATCH)

## 2011-07-05 LAB — BODY FLUID CULTURE: Culture: NO GROWTH

## 2011-07-05 LAB — GRAM STAIN

## 2011-07-05 LAB — APTT: aPTT: 33

## 2011-07-05 NOTE — Progress Notes (Signed)
  Subjective:    Patient ID: [name], female    DOB: 17-Oct-1950, 60 y.o.   MRN: [id number]  HPI    Review of Systems     Objective:   Physical Exam        Assessment & Plan:

## 2011-07-07 ENCOUNTER — Encounter: Payer: Self-pay | Admitting: Internal Medicine

## 2011-07-07 NOTE — Assessment & Plan Note (Signed)
stable overall by hx and exam, most recent data reviewed with pt, and pt to continue medical treatment as before  Lab Results  Component Value Date   WBC 11.0* 06/17/2011   HGB 10.1* 06/17/2011   HCT 30.7* 06/17/2011   PLT 299.0 06/17/2011   CHOL 157 04/04/2011   TRIG 113.0 04/04/2011   HDL 60.90 04/04/2011   LDLDIRECT 156.6 01/07/2011   ALT 20 04/04/2011   AST 20 04/04/2011   NA 139 04/04/2011   K 4.0 04/04/2011   CL 104 04/04/2011   CREATININE 1.0 04/04/2011   BUN 19 04/04/2011   CO2 27 04/04/2011   TSH 0.04* 06/17/2011   INR 1.25 12/08/2010   HGBA1C 6.0 04/04/2011   MICROALBUR 0.6 04/04/2011

## 2011-07-07 NOTE — Assessment & Plan Note (Signed)
stable overall by hx and exam, most recent data reviewed with pt, and pt to continue medical treatment as before  BP Readings from Last 3 Encounters:  07/01/11 130/62  06/17/11 152/82  05/09/11 122/74

## 2011-07-07 NOTE — Assessment & Plan Note (Signed)
stable overall by hx and exam, most recent data reviewed with pt, and pt to continue medical treatment as before  Lab Results  Component Value Date   HGBA1C 6.0 04/04/2011

## 2011-07-07 NOTE — Assessment & Plan Note (Signed)
stable overall by hx and exam, most recent data reviewed with pt, and pt to continue medical treatment as before  Lab Results  Component Value Date   LDLCALC 74 04/04/2011

## 2011-07-07 NOTE — Progress Notes (Signed)
Subjective:    Patient ID: [name], female    DOB: 11/21/1950, 60 y.o.   MRN: [id number]  HPI  Here to f/u; overall doing ok,  Pt denies chest pain, increased sob or doe, wheezing, orthopnea, PND, increased LE swelling, palpitations, dizziness or syncope.  Pt denies new neurological symptoms such as new headache, or facial or extremity weakness or numbness   Pt denies polydipsia, polyuria, or low sugar symptoms such as weakness or confusion improved with po intake.  Pt states overall good compliance with meds, trying to follow lower cholesterol, diabetic diet, wt overall stable but little exercise however.  Still with chronic dizziness, BP has been somewhat more labile recently. Overall good compliance with treatment, and good medicine tolerability. Pt denies fever, wt loss, night sweats, loss of appetite, or other constitutional symptoms  Denies worsening depressive symptoms, suicidal ideation, or panic, though has ongoing anxiety, not increased recently.  Past Medical History  Diagnosis Date  . COMMON MIGRAINE   . DIVERTICULOSIS-COLON   . CORONARY ARTERY DISEASE   . Gastroparesis   . INSOMNIA-SLEEP DISORDER-UNSPEC   . Iron deficiency anemia   . Anxiety   . DEPRESSION   . Diabetes mellitus, type II   . GERD (gastroesophageal reflux disease)   . Hyperlipidemia   . Hypertension   . Gastric ulcer 04/2008  . CERVICAL RADICULOPATHY, LEFT   . Hiatal hernia    Past Surgical History  Procedure Date  . Left knee replacement   . 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 Berenice Primas  . 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 Rotator cuff repair 01/2009  . Cholecystectomy 06/2009  . S/p bladder surgury 09/2009    Dr. Terance Hart  . Abdominal hysterectomy     reports that she has quit smoking. She has never used smokeless tobacco. She reports that she does not drink alcohol or use illicit drugs. family history includes Coronary artery disease in  her other; Diabetes in her brother and sister; Heart disease in her brother and sister; and Hypertension in her other.  There is no history of Colon cancer. Allergies  Allergen Reactions  . Hydrocodone     REACTION: tachycardia  . Penicillins    Current Outpatient Prescriptions on File Prior to Visit  Medication Sig Dispense Refill  . amLODipine-valsartan (EXFORGE) 10-320 MG per tablet Take 1 tablet by mouth daily.        Marland Kitchen aspirin 81 MG EC tablet Take 81 mg by mouth daily.        . bifidobacterium infantis (ALIGN) capsule Take 1 capsule by mouth daily.  30 capsule  11  . estradiol (ESTRACE) 1 MG tablet Take 1 tablet (1 mg total) by mouth daily.  90 tablet  3  . furosemide (LASIX) 40 MG tablet Take 40 mg by mouth daily.        Marland Kitchen gabapentin (NEURONTIN) 300 MG capsule Take 1 capsule (300 mg total) by mouth at bedtime.  30 capsule  2  . isosorbide dinitrate (ISORDIL) 10 MG tablet Take 10 mg by mouth daily.       . isosorbide mononitrate (IMDUR) 30 MG 24 hr tablet Take 1 tablet (30 mg total) by mouth daily.  30 tablet  11  . Liraglutide (VICTOZA) 18 MG/3ML SOLN Inject 0.17 mLs (1.02 mg total) into the skin daily.  6 mL  1  . meclizine (ANTIVERT) 12.5 MG tablet Take 1 tablet (12.5 mg total)  by mouth 3 (three) times daily as needed.  30 tablet  1  . metFORMIN (GLUCOPHAGE-XR) 500 MG 24 hr tablet TAKE ONE TABLET BY MOUTH TWICE DAILY WITH MEALS  180 tablet  0  . metoCLOPramide (REGLAN) 10 MG tablet Take 10 mg ac and hs  120 tablet  0  . metoprolol (LOPRESSOR) 50 MG tablet Take 1.5 tablets (75 mg total) by mouth 2 (two) times daily.  90 tablet  1  . omeprazole (PRILOSEC) 40 MG capsule Take 1 capsule (40 mg total) by mouth daily.  30 capsule  11  . oxyCODONE-acetaminophen (PERCOCET) 5-325 MG per tablet Take 1 tablet by mouth every 6 (six) hours as needed for pain.  120 tablet  0  . polyethylene glycol powder (MIRALAX) powder Take 17 g by mouth daily. 1-3 times daily as needed to keep bowels regular  255  g  0  . promethazine (PHENERGAN) 25 MG tablet Take 1/2 to 1 tablet po every 6 hours prn nausea or vomiting.  24 tablet  1  . sertraline (ZOLOFT) 50 MG tablet TAKE 1 TABLET DAILY  90 tablet  1  . simvastatin (ZOCOR) 20 MG tablet Take 1 tablet (20 mg total) by mouth daily.  90 tablet  3  . sitaGLIPtan (JANUVIA) 100 MG tablet Take 100 mg by mouth daily.        . sucralfate (CARAFATE) 1 GM/10ML suspension Take 1 g by mouth. 1 gram three times a day before meals        Review of Systems Review of Systems  Constitutional: Negative for diaphoresis and unexpected weight change.  HENT: Negative for drooling and tinnitus.   Eyes: Negative for photophobia and visual disturbance.  Respiratory: Negative for choking and stridor.   Gastrointestinal: Negative for vomiting and blood in stool.  Genitourinary: Negative for hematuria and decreased urine volume.     Objective:   Physical Exam BP 130/62  Pulse 100  Temp(Src) 98.2 F (36.8 C) (Oral)  Ht 5\' 6"  (1.676 m)  Wt 263 lb 2 oz (119.353 kg)  BMI 42.47 kg/m2  SpO2 97% Physical Exam  VS noted Constitutional: Pt appears well-developed and well-nourished.  HENT: Head: Normocephalic.  Right Ear: External ear normal.  Left Ear: External ear normal.  Eyes: Conjunctivae and EOM are normal. Pupils are equal, round, and reactive to light.  Neck: Normal range of motion. Neck supple.  Cardiovascular: Normal rate and regular rhythm.   Pulmonary/Chest: Effort normal and breath sounds normal.  Abd:  Soft, NT, non-distended, + BS Neurological: Pt is alert. No cranial nerve deficit.  Skin: Skin is warm. No erythema.  Psychiatric: Pt behavior is normal. Thought content normal.     Assessment & Plan:

## 2011-07-12 LAB — CLOTEST (H. PYLORI), BIOPSY: Helicobacter screen: NEGATIVE

## 2011-07-15 LAB — CROSSMATCH
ABO/RH(D): B NEG
Antibody Screen: NEGATIVE

## 2011-07-23 LAB — COMPREHENSIVE METABOLIC PANEL
ALT: 24
AST: 20
Albumin: 3.8
Alkaline Phosphatase: 90
BUN: 14
CO2: 25
Calcium: 9.4
Chloride: 105
Creatinine, Ser: 0.86
GFR calc Af Amer: >60
GFR calc non Af Amer: >60
Glucose, Bld: 158 — ABNORMAL HIGH
Potassium: 4
Sodium: 137
Total Bilirubin: 0.8
Total Protein: 7.4

## 2011-07-23 LAB — PROTIME-INR
INR: 1.2
INR: 1.3
INR: 1.4
INR: 1.7 — ABNORMAL HIGH
INR: 2.2 — ABNORMAL HIGH
INR: 2.3 — ABNORMAL HIGH
INR: 2.4 — ABNORMAL HIGH
Prothrombin Time: 15.1
Prothrombin Time: 16.4 — ABNORMAL HIGH
Prothrombin Time: 17.1 — ABNORMAL HIGH
Prothrombin Time: 20.3 — ABNORMAL HIGH
Prothrombin Time: 25.3 — ABNORMAL HIGH
Prothrombin Time: 26.2 — ABNORMAL HIGH
Prothrombin Time: 27.6 — ABNORMAL HIGH

## 2011-07-23 LAB — CBC
HCT: 24 — ABNORMAL LOW
HCT: 29.4 — ABNORMAL LOW
HCT: 30.5 — ABNORMAL LOW
HCT: 35.6 — ABNORMAL LOW
Hemoglobin: 10.4 — ABNORMAL LOW
Hemoglobin: 11.6 — ABNORMAL LOW
Hemoglobin: 8.2 — ABNORMAL LOW
Hemoglobin: 9.7 — ABNORMAL LOW
MCHC: 32.6
MCHC: 33
MCHC: 34
MCHC: 34.1
MCV: 90.9
MCV: 93.9
MCV: 94.9
MCV: 94.9
Platelets: 220
Platelets: 230
Platelets: 272
Platelets: 302
RBC: 2.56 — ABNORMAL LOW
RBC: 3.1 — ABNORMAL LOW
RBC: 3.36 — ABNORMAL LOW
RBC: 3.75 — ABNORMAL LOW
RDW: 13.7
RDW: 14
RDW: 14.2 — ABNORMAL HIGH
RDW: 15.3 — ABNORMAL HIGH
WBC: 10.1
WBC: 10.4
WBC: 10.5
WBC: 9.4

## 2011-07-23 LAB — DIFFERENTIAL
Basophils Absolute: 0.1
Basophils Relative: 1
Eosinophils Absolute: 0.3
Eosinophils Relative: 3
Lymphocytes Relative: 35
Lymphs Abs: 3.3
Monocytes Absolute: 0.3
Monocytes Relative: 3
Neutro Abs: 5.4
Neutrophils Relative %: 57

## 2011-07-23 LAB — CROSSMATCH
ABO/RH(D): B NEG
Antibody Screen: NEGATIVE

## 2011-07-23 LAB — URINALYSIS, ROUTINE W REFLEX MICROSCOPIC
Bilirubin Urine: NEGATIVE
Glucose, UA: NEGATIVE
Hgb urine dipstick: NEGATIVE
Ketones, ur: NEGATIVE
Nitrite: NEGATIVE
Protein, ur: NEGATIVE
Specific Gravity, Urine: 1.028
Urobilinogen, UA: 1
pH: 6

## 2011-07-23 LAB — BASIC METABOLIC PANEL
BUN: 16
BUN: 17
BUN: 8
CO2: 23
CO2: 24
CO2: 26
Calcium: 8.1 — ABNORMAL LOW
Calcium: 8.6
Calcium: 8.7
Chloride: 102
Chloride: 103
Chloride: 107
Creatinine, Ser: 0.96
Creatinine, Ser: 1.11
Creatinine, Ser: 1.23 — ABNORMAL HIGH
GFR calc Af Amer: 54 — ABNORMAL LOW
GFR calc Af Amer: >60
GFR calc Af Amer: >60
GFR calc non Af Amer: 45 — ABNORMAL LOW
GFR calc non Af Amer: 51 — ABNORMAL LOW
GFR calc non Af Amer: 60 — ABNORMAL LOW
Glucose, Bld: 146 — ABNORMAL HIGH
Glucose, Bld: 212 — ABNORMAL HIGH
Glucose, Bld: 278 — ABNORMAL HIGH
Potassium: 3.8
Potassium: 4.2
Potassium: 4.8
Sodium: 134 — ABNORMAL LOW
Sodium: 136
Sodium: 137

## 2011-07-23 LAB — TYPE AND SCREEN
ABO/RH(D): B NEG
Antibody Screen: NEGATIVE

## 2011-07-23 LAB — APTT: aPTT: 30

## 2011-07-23 LAB — ABO/RH: ABO/RH(D): B NEG

## 2011-07-29 ENCOUNTER — Encounter (HOSPITAL_COMMUNITY)
Admission: RE | Admit: 2011-07-29 | Discharge: 2011-07-29 | Disposition: A | Discharge status: Home / Self Care | Payer: Medicare Other | Source: Ambulatory Visit | Attending: Endocrinology | Admitting: Endocrinology

## 2011-07-29 DIAGNOSIS — E0590 Thyrotoxicosis, unspecified without thyrotoxic crisis or storm: Secondary | ICD-10-CM | POA: Insufficient documentation

## 2011-07-30 ENCOUNTER — Ambulatory Visit (HOSPITAL_COMMUNITY)
Admission: RE | Admit: 2011-07-30 | Discharge: 2011-07-30 | Disposition: A | Discharge status: Home / Self Care | Payer: BC Managed Care – PPO | Source: Ambulatory Visit | Attending: Endocrinology | Admitting: Endocrinology

## 2011-07-30 DIAGNOSIS — E0590 Thyrotoxicosis, unspecified without thyrotoxic crisis or storm: Secondary | ICD-10-CM | POA: Insufficient documentation

## 2011-07-30 DIAGNOSIS — R946 Abnormal results of thyroid function studies: Secondary | ICD-10-CM | POA: Insufficient documentation

## 2011-07-30 MED ORDER — SODIUM IODIDE I 131 CAPSULE
8.6000 | Freq: Once | INTRAVENOUS | Status: AC | PRN
  Administered 2011-07-29: 8.6 via ORAL

## 2011-07-30 MED ORDER — SODIUM PERTECHNETATE TC 99M INJECTION
10.0000 | Freq: Once | INTRAVENOUS | Status: AC | PRN
  Administered 2011-07-30: 10 via INTRAVENOUS

## 2011-08-07 ENCOUNTER — Other Ambulatory Visit: Payer: Self-pay | Admitting: Gastroenterology

## 2011-08-19 ENCOUNTER — Other Ambulatory Visit: Payer: Self-pay | Admitting: Endocrinology

## 2011-08-19 DIAGNOSIS — E890 Postprocedural hypothyroidism: Secondary | ICD-10-CM | POA: Insufficient documentation

## 2011-08-19 DIAGNOSIS — E0590 Thyrotoxicosis, unspecified without thyrotoxic crisis or storm: Secondary | ICD-10-CM

## 2011-08-19 DIAGNOSIS — E039 Hypothyroidism, unspecified: Secondary | ICD-10-CM | POA: Insufficient documentation

## 2011-08-20 ENCOUNTER — Other Ambulatory Visit (HOSPITAL_COMMUNITY): Payer: Self-pay | Admitting: Endocrinology

## 2011-08-20 DIAGNOSIS — E0590 Thyrotoxicosis, unspecified without thyrotoxic crisis or storm: Secondary | ICD-10-CM

## 2011-08-21 ENCOUNTER — Encounter (HOSPITAL_COMMUNITY)
Admission: RE | Admit: 2011-08-21 | Discharge: 2011-08-21 | Disposition: A | Discharge status: Home / Self Care | Payer: Medicare Other | Source: Ambulatory Visit | Attending: Endocrinology | Admitting: Endocrinology

## 2011-08-21 DIAGNOSIS — E0590 Thyrotoxicosis, unspecified without thyrotoxic crisis or storm: Secondary | ICD-10-CM

## 2011-08-21 MED ORDER — SODIUM IODIDE I 131 CAPSULE
21.2000 | Freq: Once | INTRAVENOUS | Status: AC | PRN
  Administered 2011-08-21: 21.2 via ORAL

## 2011-08-26 ENCOUNTER — Other Ambulatory Visit: Payer: Self-pay | Admitting: Gastroenterology

## 2011-08-26 ENCOUNTER — Other Ambulatory Visit: Payer: Self-pay | Admitting: Internal Medicine

## 2011-09-17 ENCOUNTER — Encounter: Payer: Self-pay | Admitting: Gastroenterology

## 2011-09-17 ENCOUNTER — Ambulatory Visit (INDEPENDENT_AMBULATORY_CARE_PROVIDER_SITE_OTHER): Payer: BC Managed Care – PPO | Admitting: Gastroenterology

## 2011-09-17 VITALS — BP 112/68 | HR 80 | Ht 66.0 in | Wt 259.6 lb

## 2011-09-17 DIAGNOSIS — K3184 Gastroparesis: Secondary | ICD-10-CM

## 2011-09-17 DIAGNOSIS — K219 Gastro-esophageal reflux disease without esophagitis: Secondary | ICD-10-CM

## 2011-09-17 MED ORDER — OMEPRAZOLE 40 MG PO CPDR: 40.0000 mg | DELAYED_RELEASE_CAPSULE | Freq: Two times a day (BID) | ORAL | Status: DC

## 2011-09-17 MED ORDER — METOCLOPRAMIDE HCL 10 MG PO TABS: ORAL_TABLET | ORAL | Status: DC

## 2011-09-17 NOTE — Patient Instructions (Signed)
Increase your omeprazole 40 mg one tablet by mouth twice daily. A prescription has been sent to your pharmacy. Patient advised to avoid spicy, acidic, citrus, chocolate, mints, fruit and fruit juices.  Limit the intake of caffeine, alcohol and Soda.  Don't exercise too soon after eating.  Don't lie down within 3-4 hours of eating.  Elevate the head of your bed. A prescription for Reglan has been sent to your pharmacy for you to take before meals and at bedtime. Gastroparesis diet given. Cc: Cathlean Cower, MD

## 2011-09-17 NOTE — Progress Notes (Signed)
History of Present Illness: This is a 60 year old female here today with her husband with complaints of recurrent nausea and occasional vomiting. She has frequent reflux symptoms. Her nausea and reflux symptoms tend to be much worse in the morning and frequently are exacerbated by meals. She has been taking metoclopramide once or twice a day at the most. Her last upper endoscopy performed in 2010 revealed retained solids and no obstruction. She takes Percocet twice daily to control pain. Denies weight loss, abdominal pain, constipation, diarrhea, change in stool caliber, melena, hematochezia, dysphagia, chest pain.  Current Medications, Allergies, Past Medical History, Past Surgical History, Family History and Social History were reviewed in [employer] [facility] record.  Physical Exam: General: Well developed , well nourished, obese, no acute distress Head: Normocephalic and atraumatic Eyes:  sclerae anicteric, EOMI Ears: Normal auditory acuity Mouth: No deformity or lesions Lungs: Clear throughout to auscultation Heart: Regular rate and rhythm; no murmurs, rubs or bruits Abdomen: Soft, non tender and non distended. No masses, hepatosplenomegaly or hernias noted. Normal Bowel sounds Musculoskeletal: Symmetrical with no gross deformities  Pulses:  Normal pulses noted Extremities: No clubbing, cyanosis, edema or deformities noted Neurological: Alert oriented x 4, grossly nonfocal Psychological:  Alert and cooperative. Normal mood and affect  Assessment and Recommendations:  1. Gastroparesis and GERD. Intensify all standard dietary measures for gastroparesis and GERD. Advised her that Percocet and other narcotic pain medications can decrease gastrointestinal motility and exacerbate her GI symptoms. Increase omeprazole to 40 mg twice daily. Increase metoclopramide to 10 mg a.c. and hs. She may decrease her metoclopramide once her symptoms are under better control. Return office visit  in 3 months

## 2011-09-19 ENCOUNTER — Ambulatory Visit: Payer: BC Managed Care – PPO | Admitting: Internal Medicine

## 2011-09-19 ENCOUNTER — Ambulatory Visit: Payer: Medicare Other | Admitting: Internal Medicine

## 2011-09-21 ENCOUNTER — Encounter: Payer: Self-pay | Admitting: Family Medicine

## 2011-09-21 ENCOUNTER — Ambulatory Visit (INDEPENDENT_AMBULATORY_CARE_PROVIDER_SITE_OTHER): Payer: BC Managed Care – PPO | Admitting: Family Medicine

## 2011-09-21 VITALS — BP 138/78 | HR 90 | Temp 98.6°F | Ht 66.0 in | Wt 257.0 lb

## 2011-09-21 DIAGNOSIS — J069 Acute upper respiratory infection, unspecified: Secondary | ICD-10-CM

## 2011-09-21 DIAGNOSIS — J029 Acute pharyngitis, unspecified: Secondary | ICD-10-CM

## 2011-09-21 LAB — POCT RAPID STREP A (OFFICE): Rapid Strep A Screen: NEGATIVE

## 2011-09-21 MED ORDER — OSELTAMIVIR PHOSPHATE 75 MG PO CAPS: 75.0000 mg | ORAL_CAPSULE | Freq: Two times a day (BID) | ORAL | Status: AC

## 2011-09-21 NOTE — Assessment & Plan Note (Signed)
No obvious infxn on PE.  Due to likely recent contact w/ the flu will start Tamiflu.  Reviewed supportive care and red flags that should prompt return.  Pt expressed understanding and is in agreement w/ plan.

## 2011-09-21 NOTE — Progress Notes (Signed)
  Subjective:    Patient ID: [name], female    DOB: 1950/10/20, 60 y.o.   MRN: [id number]  HPI Sore throat- 'i think i'm getting a nasty cold'.  + sick contacts.  sxs started yesterday w/ nasal congestion, sore throat, ear pain bilaterally.  No fevers.  Minimal cough.  + HA.  Denies facial pain/pressure.   Review of Systems For ROS see HPI     Objective:   Physical Exam  Vitals reviewed. Constitutional: She appears well-developed and well-nourished. No distress.  HENT:  Head: Normocephalic and atraumatic.  Nose: Nose normal.  Mouth/Throat: Oropharynx is clear and moist. No oropharyngeal exudate.       TMs normal bilaterally No TTP over sinuses  Neck: Normal range of motion. Neck supple.  Pulmonary/Chest: Effort normal and breath sounds normal. No respiratory distress. She has no wheezes. She has no rales.       No cough  Lymphadenopathy:    She has cervical adenopathy.  Neurological: She is alert.  Skin: Skin is warm and dry.          Assessment & Plan:

## 2011-09-21 NOTE — Patient Instructions (Signed)
This is a viral illness In case this is early flu, start the Tamiflu twice a day Drink plenty of fluids Rest! Call if your symptoms change or worsen Call with any questions or concerns Hang in there! Happy Holidays!

## 2011-10-11 ENCOUNTER — Other Ambulatory Visit: Payer: Self-pay | Admitting: Internal Medicine

## 2011-10-14 ENCOUNTER — Other Ambulatory Visit: Payer: Self-pay | Admitting: Gastroenterology

## 2011-10-14 ENCOUNTER — Other Ambulatory Visit: Payer: Self-pay | Admitting: *Deleted

## 2011-10-14 DIAGNOSIS — I10 Essential (primary) hypertension: Secondary | ICD-10-CM

## 2011-10-14 MED ORDER — METOPROLOL TARTRATE 50 MG PO TABS: 75.0000 mg | ORAL_TABLET | Freq: Two times a day (BID) | ORAL | Status: DC

## 2011-10-22 ENCOUNTER — Telehealth: Payer: Self-pay | Admitting: Internal Medicine

## 2011-10-22 NOTE — Telephone Encounter (Signed)
Ok with me 

## 2011-10-22 NOTE — Telephone Encounter (Signed)
The pt called and is requesting a apt today or tomorrow for back pain.  Can she be worked in? Please advise, thanks!

## 2011-10-24 ENCOUNTER — Telehealth: Payer: Self-pay

## 2011-10-24 ENCOUNTER — Other Ambulatory Visit (INDEPENDENT_AMBULATORY_CARE_PROVIDER_SITE_OTHER): Payer: BC Managed Care – PPO

## 2011-10-24 ENCOUNTER — Ambulatory Visit (INDEPENDENT_AMBULATORY_CARE_PROVIDER_SITE_OTHER): Payer: BC Managed Care – PPO | Admitting: Internal Medicine

## 2011-10-24 ENCOUNTER — Encounter: Payer: Self-pay | Admitting: Internal Medicine

## 2011-10-24 VITALS — BP 130/72 | HR 85 | Temp 98.7°F | Ht 66.0 in | Wt 259.2 lb

## 2011-10-24 DIAGNOSIS — I10 Essential (primary) hypertension: Secondary | ICD-10-CM

## 2011-10-24 DIAGNOSIS — R1032 Left lower quadrant pain: Secondary | ICD-10-CM

## 2011-10-24 DIAGNOSIS — M5416 Radiculopathy, lumbar region: Secondary | ICD-10-CM

## 2011-10-24 DIAGNOSIS — M549 Dorsalgia, unspecified: Secondary | ICD-10-CM

## 2011-10-24 DIAGNOSIS — IMO0002 Reserved for concepts with insufficient information to code with codable children: Secondary | ICD-10-CM

## 2011-10-24 LAB — URINALYSIS, ROUTINE W REFLEX MICROSCOPIC
Bilirubin Urine: NEGATIVE
Hgb urine dipstick: NEGATIVE
Ketones, ur: NEGATIVE
Leukocytes, UA: NEGATIVE
Nitrite: NEGATIVE
Specific Gravity, Urine: 1.02 (ref 1.000–1.030)
Total Protein, Urine: NEGATIVE
Urine Glucose: NEGATIVE
Urobilinogen, UA: 0.2 (ref 0.0–1.0)
pH: 6 (ref 5.0–8.0)

## 2011-10-24 MED ORDER — LEVOFLOXACIN 250 MG PO TABS: 250.0000 mg | ORAL_TABLET | Freq: Every day | ORAL | Status: DC

## 2011-10-24 MED ORDER — LEVOFLOXACIN 250 MG PO TABS: 250.0000 mg | ORAL_TABLET | Freq: Every day | ORAL | Status: AC

## 2011-10-24 MED ORDER — METRONIDAZOLE 250 MG PO TABS: 250.0000 mg | ORAL_TABLET | Freq: Three times a day (TID) | ORAL | Status: AC

## 2011-10-24 MED ORDER — OXYCODONE-ACETAMINOPHEN 5-325 MG PO TABS: 1.0000 | ORAL_TABLET | Freq: Four times a day (QID) | ORAL | Status: DC | PRN

## 2011-10-24 NOTE — Patient Instructions (Addendum)
Take all new medications as prescribed Continue all other medications as before Please go to LAB in the Basement for the blood and/or urine tests to be done today Please call the phone number [phone] (the [facility]) for results of testing in 2-3 days;  When calling, simply dial the number, and when prompted enter the MRN number above (the Medical Record Number) and the # key, then the message should start. If the urine test is negative, you may need a second antibiotic Please return if the pain or other symptoms become worse Please consider MRI for the lower back

## 2011-10-24 NOTE — Assessment & Plan Note (Signed)
With significant tenderness, without guarding or rebound, and no obvious GI or GU symtpom change or fever per pt, but diff should include early UTI or diverticulitis - for now to check urine studies, empiric levaquin; but for empiric flagyl addition if UA neg

## 2011-10-24 NOTE — Progress Notes (Signed)
Addended by: Biagio Borg on: 10/24/2011 02:31 PM   Modules accepted: Orders

## 2011-10-24 NOTE — Progress Notes (Signed)
Subjective:    Patient ID: [name], female    DOB: 07-Aug-1951, 61 y.o.   MRN: [id number]  HPI  Here with 3-4 days acute onset LLQ abd pain with radiation straight to the back, mod to severe, worst was yesterday, somewhat less today but still persists; completley denies any change in GU or GI symptoms -  Denies urinary symptoms such as dysuria, frequency, urgency,or hematuria., or constipation, diarrhea, n/v, blood in stool.  Does have chronic bladder pain so admits she might not know if having UTI.  Does have hx of diverticulitis as well.  Last colonscopy dec 2008 per pt report.  No fever, chills, and Pt denies chest pain, increased sob or doe, wheezing, orthopnea, PND, increased LE swelling, palpitations, dizziness or syncope.  Does also have ongoing back pain -  Pt denies recent awareness of any change in severity, bowel or bladder change, fever, wt loss,  worsening LE pain/numbness/weakness, gait change or falls, but getting up from sitting makes worse, better to sit or lie down. Past Medical History  Diagnosis Date  . COMMON MIGRAINE   . DIVERTICULOSIS-COLON   . CORONARY ARTERY DISEASE   . Gastroparesis   . INSOMNIA-SLEEP DISORDER-UNSPEC   . Iron deficiency anemia   . Anxiety   . DEPRESSION   . Diabetes mellitus, type II   . GERD (gastroesophageal reflux disease)   . Hyperlipidemia   . Hypertension   . Gastric ulcer 04/2008  . CERVICAL RADICULOPATHY, LEFT   . Hiatal hernia    Past Surgical History  Procedure Date  . Left knee replacement   . 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 Berenice Primas  . 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 Rotator cuff repair 01/2009  . Cholecystectomy 06/2009  . S/p bladder surgury 09/2009    Dr. Terance Hart  . Abdominal hysterectomy     reports that she has quit smoking. She has never used smokeless tobacco. She reports that she does not drink alcohol or use illicit drugs. family history includes  Coronary artery disease in her other; Diabetes in her brother and sister; Heart disease in her brother and sister; and Hypertension in her other.  There is no history of Colon cancer. Allergies  Allergen Reactions  . Hydrocodone     REACTION: tachycardia  . Penicillins    Current Outpatient Prescriptions on File Prior to Visit  Medication Sig Dispense Refill  . aspirin 81 MG EC tablet Take 81 mg by mouth daily.        . bifidobacterium infantis (ALIGN) capsule Take 1 capsule by mouth daily.  30 capsule  11  . CARAFATE 1 GM/10ML suspension TAKE 2 TEASPOONFULS BY MOUTH THREE TIMES A DAY BEFORE MEALS  900 mL  0  . estradiol (ESTRACE) 1 MG tablet Take 1 tablet (1 mg total) by mouth daily.  90 tablet  3  . EXFORGE 10-320 MG per tablet TAKE 1 TABLET DAILY  90 tablet  2  . furosemide (LASIX) 40 MG tablet Take 40 mg by mouth daily.        Marland Kitchen gabapentin (NEURONTIN) 300 MG capsule Take 1 capsule (300 mg total) by mouth at bedtime.  30 capsule  2  . isosorbide dinitrate (ISORDIL) 10 MG tablet Take 10 mg by mouth daily.       . isosorbide mononitrate (IMDUR) 30 MG 24 hr tablet Take 1 tablet (30 mg total) by mouth daily.  30 tablet  11  . JANUVIA 100 MG tablet TAKE 1 TABLET DAILY  90 tablet  3  . Levothyroxine Sodium (SYNTHROID PO) Take 1 tablet by mouth daily.        . Liraglutide (VICTOZA) 18 MG/3ML SOLN Inject 0.17 mLs (1.02 mg total) into the skin daily.  6 mL  1  . meclizine (ANTIVERT) 12.5 MG tablet Take 1 tablet (12.5 mg total) by mouth 3 (three) times daily as needed.  30 tablet  1  . metFORMIN (GLUCOPHAGE-XR) 500 MG 24 hr tablet TAKE 1 TABLET TWICE A DAY  180 tablet  2  . metoCLOPramide (REGLAN) 10 MG tablet TAKE 1 TABLET 30 MINUTES PRIOR TO MEALS  90 tablet  0  . metoprolol (LOPRESSOR) 50 MG tablet Take 1.5 tablets (75 mg total) by mouth 2 (two) times daily.  90 tablet  1  . omeprazole (PRILOSEC) 40 MG capsule Take 1 capsule (40 mg total) by mouth 2 (two) times daily.  60 capsule  11  .  polyethylene glycol powder (MIRALAX) powder Take 17 g by mouth daily. 1-3 times daily as needed to keep bowels regular  255 g  0  . promethazine (PHENERGAN) 25 MG tablet TAKE ONE-HALF TO ONE TABLET BY MOUTH EVERY 6 HOURS AS NEEDED FOR NAUSEA AND VOMITING  30 tablet  0  . sertraline (ZOLOFT) 50 MG tablet TAKE 1 TABLET DAILY  90 tablet  1  . simvastatin (ZOCOR) 20 MG tablet TAKE 1 TABLET DAILY  90 tablet  2  . DISCONTD: oxyCODONE-acetaminophen (PERCOCET) 5-325 MG per tablet Take 1 tablet by mouth every 6 (six) hours as needed for pain.  120 tablet  0   Review of Systems Review of Systems  Constitutional: Negative for diaphoresis and unexpected weight change.  HENT: Negative for drooling and tinnitus.   Eyes: Negative for photophobia and visual disturbance.  Respiratory: Negative for choking and stridor.   Gastrointestinal: Negative for vomiting and blood in stool.  Genitourinary: Negative for hematuria and decreased urine volume.     Objective:   Physical Exam BP 130/72  Pulse 85  Temp(Src) 98.7 F (37.1 C) (Oral)  Ht 5\' 6"  (1.676 m)  Wt 259 lb 4 oz (117.595 kg)  BMI 41.84 kg/m2  SpO2 98% Physical Exam  VS noted, fatigued, c/o but not "ill" appearing Constitutional: Pt appears well-developed and well-nourished.  HENT: Head: Normocephalic.  Right Ear: External ear normal.  Left Ear: External ear normal.  Eyes: Conjunctivae and EOM are normal. Pupils are equal, round, and reactive to light.  Neck: Normal range of motion. Neck supple.  Cardiovascular: Normal rate and regular rhythm.   Pulmonary/Chest: Effort normal and breath sounds normal.  Abd:  Soft, non-distended, + BS but with moderate tender to somewhat deeper palpation low mid abd/LLQ, without guarding/rebound Spine: diffuse mid low midline lumbar tender and somewhat left paravertebral tender, without rash, swelling, erythema, approx L3-L5 area Neurological: Pt is alert. No cranial nerve deficit.  but has significant LLE  weakness  - motor4-4+/5 diffuse, with o/w LE sens/dtr/gait intact, no overt foot drop Skin: Skin is warm. No erythema.  Psychiatric: Pt behavior is normal. Thought content normal. somewhat dysphoric?    Assessment & Plan:

## 2011-10-24 NOTE — Assessment & Plan Note (Addendum)
"  surprise" finding on exam today in the setting of ongoing recurrent LBP, Pt adamant she does not want MRI or surgical eval, has known persons with complicated postop courses and will not consider at this time

## 2011-10-24 NOTE — Assessment & Plan Note (Signed)
stable overall by hx and exam, most recent data reviewed with pt, and pt to continue medical treatment as before  BP Readings from Last 3 Encounters:  10/24/11 130/72  09/21/11 138/78  09/17/11 112/68

## 2011-10-24 NOTE — Telephone Encounter (Signed)
Antibiotic sent in today was to be sent to [facility]

## 2011-10-26 LAB — URINE CULTURE: Colony Count: 3000

## 2011-11-27 ENCOUNTER — Other Ambulatory Visit: Payer: Self-pay | Admitting: Gastroenterology

## 2011-12-31 ENCOUNTER — Other Ambulatory Visit: Payer: Self-pay | Admitting: Endocrinology

## 2012-01-17 ENCOUNTER — Telehealth: Payer: Self-pay | Admitting: Gastroenterology

## 2012-01-17 MED ORDER — PROMETHAZINE HCL 25 MG PO TABS: ORAL_TABLET | ORAL | Status: DC

## 2012-01-17 NOTE — Telephone Encounter (Signed)
Patient was seen by Dr Dwyane Dee her endocrinologist yesterday and she was instructed to schedule an office visit with Dr Fuller Plan.  She reports constant nausea and vomiting about 1 hour after meals or liquids.  I have called her in a refill of phenergan asked her to try taking this along with her reglan ac meals and HS for a day or two.  Patient reports she is unable to tolerate a diet or liquids at all today.  She has been holding her diabetes meds because she can't keep liquids down.  I have advised her that if she is not able to tolerate liquids by tomorrow with the reglan or phenergan she will need to go to the ER for eval.  She is scheduled to see Nicoletta Ba PA on 01/22/12 at 1:30.  She verbalized understanding to go the ER for continued vomiting.

## 2012-01-22 ENCOUNTER — Ambulatory Visit: Payer: Medicare Other | Admitting: Physician Assistant

## 2012-01-23 ENCOUNTER — Ambulatory Visit (INDEPENDENT_AMBULATORY_CARE_PROVIDER_SITE_OTHER): Payer: Medicare Other | Admitting: Physician Assistant

## 2012-01-23 ENCOUNTER — Encounter: Payer: Self-pay | Admitting: Physician Assistant

## 2012-01-23 VITALS — BP 122/72 | HR 88 | Ht 66.0 in | Wt 258.2 lb

## 2012-01-23 DIAGNOSIS — R1013 Epigastric pain: Secondary | ICD-10-CM

## 2012-01-23 DIAGNOSIS — K279 Peptic ulcer, site unspecified, unspecified as acute or chronic, without hemorrhage or perforation: Secondary | ICD-10-CM

## 2012-01-23 DIAGNOSIS — R112 Nausea with vomiting, unspecified: Secondary | ICD-10-CM

## 2012-01-23 DIAGNOSIS — K311 Adult hypertrophic pyloric stenosis: Secondary | ICD-10-CM

## 2012-01-23 MED ORDER — ONDANSETRON 4 MG PO TBDP: ORAL_TABLET | ORAL | Status: DC

## 2012-01-23 NOTE — Progress Notes (Addendum)
Subjective:    Patient ID: [name], female    DOB: [birth year], 61 y.o.   MRN: [id number]  HPI [name] is a pleasant 61 year old female known to Dr. Fuller Plan.. 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 She underwent upper endoscopy in 2009 and was found to have a gastric ulcer at the pylorus and had endoscopy in 2010 for  complaints of upper abdominal pain, nausea and vomiting and at that time was found to have retained food in the stomach and erosions at the GE junction and postoperative changes but no stricture or ulcer found at that time.  She does have history of coronary artery disease and diabetes and may have a component of gastroparesis. She is already maintained on Prilosec 20 mg by mouth twice daily Carafate 1 g 4 times daily Reglan 10 mg before meals and has been taking Phenergan as needed for nausea.  She comes in today with complaints of one-week history of nausea and vomiting associated with upper abdominal fullness bloating and early satiety. She has been having some heartburn though not constant. She says she has a terrible older with her belching and soreness across her upper abdomen. He has been having bowel movements takes chronic laxatives and has not noted any melena or hematochezia. She has not changed any of her medications but does take a baby aspirin daily but is not on any NSAIDs. She says she feels miserable and his if everything she eats is sitting in her stomach    Review of Systems  Constitutional: Negative.   HENT: Negative.   Eyes: Negative.   Respiratory: Negative.   Cardiovascular: Negative.   Gastrointestinal: Positive for nausea, vomiting, abdominal pain and abdominal distention.  Genitourinary: Negative.   Musculoskeletal: Negative.   Neurological: Negative.   Hematological: Negative.   Psychiatric/Behavioral: Negative.    Outpatient Prescriptions Prior to Visit  Medication Sig Dispense Refill   . aspirin 81 MG EC tablet Take 81 mg by mouth daily.        . bifidobacterium infantis (ALIGN) capsule Take 1 capsule by mouth daily.  30 capsule  11  . CARAFATE 1 GM/10ML suspension TAKE 2 TEASPOONFULS BY MOUTH THREE TIMES A DAY BEFORE MEALS  900 mL  1  . estradiol (ESTRACE) 1 MG tablet Take 1 tablet (1 mg total) by mouth daily.  90 tablet  3  . EXFORGE 10-320 MG per tablet TAKE 1 TABLET DAILY  90 tablet  2  . furosemide (LASIX) 40 MG tablet Take 40 mg by mouth daily.        Marland Kitchen gabapentin (NEURONTIN) 300 MG capsule Take 1 capsule (300 mg total) by mouth at bedtime.  30 capsule  2  . isosorbide dinitrate (ISORDIL) 10 MG tablet Take 10 mg by mouth daily.       . isosorbide mononitrate (IMDUR) 30 MG 24 hr tablet Take 1 tablet (30 mg total) by mouth daily.  30 tablet  11  . JANUVIA 100 MG tablet TAKE 1 TABLET DAILY  90 tablet  3  . Levothyroxine Sodium (SYNTHROID PO) Take 1 tablet by mouth daily.        . Liraglutide (VICTOZA) 18 MG/3ML SOLN Inject 0.17 mLs (1.02 mg total) into the skin daily.  6 mL  1  . meclizine (ANTIVERT) 12.5 MG tablet Take 1 tablet (12.5 mg total) by mouth 3 (three) times daily as needed.  30 tablet  1  .  metFORMIN (GLUCOPHAGE-XR) 500 MG 24 hr tablet TAKE 1 TABLET TWICE A DAY  180 tablet  2  . metoCLOPramide (REGLAN) 10 MG tablet TAKE 1 TABLET 30 MINUTES PRIOR TO MEALS  90 tablet  0  . metoprolol (LOPRESSOR) 50 MG tablet Take 1.5 tablets (75 mg total) by mouth 2 (two) times daily.  90 tablet  1  . omeprazole (PRILOSEC) 40 MG capsule Take 1 capsule (40 mg total) by mouth 2 (two) times daily.  60 capsule  11  . oxyCODONE-acetaminophen (PERCOCET) 5-325 MG per tablet Take 1 tablet by mouth every 6 (six) hours as needed for pain.  60 tablet  0  . polyethylene glycol powder (MIRALAX) powder Take 17 g by mouth daily. 1-3 times daily as needed to keep bowels regular  255 g  0  . promethazine (PHENERGAN) 25 MG tablet Take 1/2 to one tablet by mouth every 6 hours as needed for nausea  and vomiting  30 tablet  0  . sertraline (ZOLOFT) 50 MG tablet TAKE 1 TABLET DAILY  90 tablet  1  . simvastatin (ZOCOR) 20 MG tablet TAKE 1 TABLET DAILY  90 tablet  2   Allergies  Allergen Reactions  . Hydrocodone     REACTION: tachycardia  . Penicillins    Patient Active Problem List  Diagnoses  . DIABETES MELLITUS, TYPE II  . HYPERLIPIDEMIA  . ANEMIA-NOS  . ANXIETY  . INSOMNIA-SLEEP DISORDER-UNSPEC  . DEPRESSION  . COMMON MIGRAINE  . HYPERTENSION  . CORONARY ARTERY DISEASE  . GERD  . ULCER-GASTRIC  . Gastroparesis  . DIVERTICULOSIS-COLON  . CHOLELITHIASIS  . Hypertonicity of bladder  . MENOPAUSAL DISORDER  . Pain in joint, shoulder region  . CERVICAL RADICULOPATHY, LEFT  . Hematochezia  . Preventative health care  . Back pain  . Right leg weakness  . Hyperthyroidism  . Primary hyperthyroidism  . Abdominal pain, LLQ  . Lumbar radiculopathy       Objective:   Physical Exam well-developed African American female in no acute distress, accompanied by her husband. Blood pressure 122/72 pulse 88, height 5 foot 6 weight 258. 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 Will need to rule out recurrent peptic ulcer disease rule out partial gastric outlet obstruction due to anastomotic stricture, or exacerbation of gastroparesis.  Plan; we'll schedule for upper endoscopy with Dr. Fuller Plan as soon as possible, procedure was discussed in detail with the patient and her husband and they are agreeable to proceed. In the interim will ask her to back off to a full liquid diet  Trial of Zofran 4 mg ODT every 6 hours as needed for nausea  Continue Prilosec 20  mg by mouth twice daily, and Carafate 1 g 4 times daily as well as Reglan 10 mg 3 times daily a.c. as she has been doing. It appears that she does have gastroparesis she may benefit from a trial of erythromycin or a change to domperidone.  Addendum: Reviewed and agree with management. Lajuan Lines. Pyrtle, M.D.  01/28/2012

## 2012-01-23 NOTE — Patient Instructions (Signed)
We scheduled the Endoscopy with Dr. Fuller Plan for Mon 01-27-2012. We sent a prescription for Zofran for nausea to [facility].   We have given you a full liquid diet brochure to follow.

## 2012-01-27 ENCOUNTER — Telehealth: Payer: Self-pay

## 2012-01-27 ENCOUNTER — Ambulatory Visit (AMBULATORY_SURGERY_CENTER): Payer: Medicare Other | Admitting: Gastroenterology

## 2012-01-27 ENCOUNTER — Encounter: Payer: Self-pay | Admitting: Gastroenterology

## 2012-01-27 ENCOUNTER — Telehealth: Payer: Self-pay | Admitting: *Deleted

## 2012-01-27 VITALS — BP 156/84 | HR 88 | Temp 98.9°F | Resp 20 | Ht 66.0 in | Wt 258.0 lb

## 2012-01-27 DIAGNOSIS — K279 Peptic ulcer, site unspecified, unspecified as acute or chronic, without hemorrhage or perforation: Secondary | ICD-10-CM

## 2012-01-27 DIAGNOSIS — T182XXA Foreign body in stomach, initial encounter: Secondary | ICD-10-CM

## 2012-01-27 DIAGNOSIS — K311 Adult hypertrophic pyloric stenosis: Secondary | ICD-10-CM

## 2012-01-27 DIAGNOSIS — R112 Nausea with vomiting, unspecified: Secondary | ICD-10-CM

## 2012-01-27 DIAGNOSIS — R6881 Early satiety: Secondary | ICD-10-CM

## 2012-01-27 DIAGNOSIS — K219 Gastro-esophageal reflux disease without esophagitis: Secondary | ICD-10-CM

## 2012-01-27 LAB — GLUCOSE, CAPILLARY
Glucose-Capillary: 85 mg/dL (ref 70–99)
Glucose-Capillary: 94 mg/dL (ref 70–99)
Glucose-Capillary: 136 mg/dL — ABNORMAL HIGH (ref 70–99)

## 2012-01-27 MED ORDER — DEXTROSE 5 % IV SOLN: INTRAVENOUS | Status: DC

## 2012-01-27 NOTE — Telephone Encounter (Signed)
Called patients home and mobile phone to find out if she is on her way.  She was supposed to arrive at [facility] at 12:30 for a 13:30 appointment.  No answer at either number.

## 2012-01-27 NOTE — Progress Notes (Signed)
Patient did not experience any of the following events: a burn prior to discharge; a fall within the facility; wrong site/side/patient/procedure/implant event; or a hospital transfer or hospital admission upon discharge from the facility. (G8907) Patient did not have preoperative order for IV antibiotic SSI prophylaxis. (G8918)  

## 2012-01-27 NOTE — Patient Instructions (Signed)
YOU HAD AN ENDOSCOPIC PROCEDURE TODAY AT THE [facility]: Refer to the procedure report that was given to you for any specific questions about what was found during the examination.  If the procedure report does not answer your questions, please call your gastroenterologist to clarify.  If you requested that your care partner not be given the details of your procedure findings, then the procedure report has been included in a sealed envelope for you to review at your convenience later.  YOU SHOULD EXPECT: Some feelings of bloating in the abdomen. Passage of more gas than usual.  Walking can help get rid of the air that was put into your GI tract during the procedure and reduce the bloating. If you had a lower endoscopy (such as a colonoscopy or flexible sigmoidoscopy) you may notice spotting of blood in your stool or on the toilet paper. If you underwent a bowel prep for your procedure, then you may not have a normal bowel movement for a few days.  DIET: Your first meal following the procedure should be a light meal and then it is ok to progress to your normal diet.  A half-sandwich or bowl of soup is an example of a good first meal.  Heavy or fried foods are harder to digest and may make you feel nauseous or bloated.  Likewise meals heavy in dairy and vegetables can cause extra gas to form and this can also increase the bloating.  Drink plenty of fluids but you should avoid alcoholic beverages for 24 hours.  ACTIVITY: Your care partner should take you home directly after the procedure.  You should plan to take it easy, moving slowly for the rest of the day.  You can resume normal activity the day after the procedure however you should NOT DRIVE or use heavy machinery for 24 hours (because of the sedation medicines used during the test).    SYMPTOMS TO REPORT IMMEDIATELY: A gastroenterologist can be reached at any hour.  During normal business hours, 8:30 AM to 5:00 PM Monday through Friday,  call [phone].  After hours and on weekends, please call the GI answering service at [phone] who will take a message and have the physician on call contact you.    Following upper endoscopy (EGD)  Vomiting of blood or coffee ground material  New chest pain or pain under the shoulder blades  Painful or persistently difficult swallowing  New shortness of breath  Fever of 100F or higher  Black, tarry-looking stools  FOLLOW UP: If any biopsies were taken you will be contacted by phone or by letter within the next 1-3 weeks.  Call your gastroenterologist if you have not heard about the biopsies in 3 weeks.  Our staff will call the home number listed on your records the next business day following your procedure to check on you and address any questions or concerns that you may have at that time regarding the information given to you following your procedure. This is a courtesy call and so if there is no answer at the home number and we have not heard from you through the emergency physician on call, we will assume that you have returned to your regular daily activities without incident.  SIGNATURES/CONFIDENTIALITY: You and/or your care partner have signed paperwork which will be entered into your electronic medical record.  These signatures attest to the fact that that the information above on your After Visit Summary has been reviewed and is understood.  Full   responsibility of the confidentiality of this discharge information lies with you and/or your care-partner.   Resume medications. Information on gastroparesis and full liquid diet given with D/C Instructions.

## 2012-01-27 NOTE — Telephone Encounter (Signed)
Patient is scheduled for an UGI series at [facility] for 03/01/12 9:30.  I will continue to try and reach the patient

## 2012-01-27 NOTE — Op Note (Signed)
[facility] [address] [place], [place]  [id number]  ENDOSCOPY PROCEDURE REPORT PATIENT:  [name], [name]  MR#:  [id number] BIRTHDATE:  06-Feb-1951, 60 yrs. old  GENDER:  female ENDOSCOPIST:  Norberto Sorenson T. Fuller Plan, MD, [facility]  PROCEDURE DATE:  01/27/2012 PROCEDURE:  EGD with foreign body removal ASA CLASS:  Class III INDICATIONS:  nausea and vomiting, early satiety MEDICATIONS:   These medications were titrated to patient response per physician's verbal order, Fentanyl 62.5 mcg IV, Versed 6 mg IV TOPICAL ANESTHETIC:  Cetacaine Spray DESCRIPTION OF PROCEDURE:   After the risks benefits and alternatives of the procedure were thoroughly explained, informed consent was obtained.  The LB [id number] endoscope was introduced through the mouth and advanced to the second portion of the duodenum, limited by Retained food in the stomach.   The instrument was slowly withdrawn as the mucosa was fully examined. <<PROCEDUREIMAGES>> Retained food was present in the body and the fundus of the stomach. Post-operative change were noted in the antrum. Otherwise normal stomach.  The esophagus and gastroesophageal junction were completely normal in appearance. The duodenal bulb was normal in appearance, as was the postbulbar duodenum. 2 sutures associated with the post operative changes were noted. With standard forceps, the 2 sutures were removed and sent to pathology.  Retroflexed views revealed no abnormalities.  The scope was then withdrawn from the patient and the procedure completed.  COMPLICATIONS:  None  ENDOSCOPIC IMPRESSION: 1) Food, retained in the stomach 2) Post-operative change in the antrum 3) Sutures removed  RECOMMENDATIONS: 1) Anti-reflux regimen and gastroparesis diet-first with only full liquids until symptoms controlled 2) Await pathology results 3) Continue current medications 4) UGI series to define post op anatomy  Canton Yearby T. Fuller Plan, MD, [facility]  CC:  Johnathan Hausen, MD  n. Lorrin MaisPricilla Riffle. Dave Mannes at 01/27/2012 02:17 PM  [name], [id number]

## 2012-01-28 ENCOUNTER — Telehealth: Payer: Self-pay

## 2012-01-28 NOTE — Telephone Encounter (Signed)
  Follow up Call-  Call back number 01/27/2012  Post procedure Call Back phone  # [phone]  Permission to leave phone message Yes     Patient questions:  Do you have a fever, pain , or abdominal swelling? no Pain Score  0 *  Have you tolerated food without any problems? yes  Have you been able to return to your normal activities? yes  Do you have any questions about your discharge instructions: Diet   no Medications  no Follow up visit  no  Do you have questions or concerns about your Care? no  Actions: * If pain score is 4 or above: No action needed, pain <4.

## 2012-01-29 NOTE — Telephone Encounter (Signed)
I have left a detailed message for the patient to call back to discuss UGI scheduled for 03/01/12

## 2012-01-29 NOTE — Telephone Encounter (Signed)
Patient advised of appt date and time

## 2012-01-30 ENCOUNTER — Telehealth: Payer: Self-pay

## 2012-01-30 NOTE — Telephone Encounter (Signed)
I last saw pt in jan 2013, on januvia only  Please stay with [name] only

## 2012-01-30 NOTE — Telephone Encounter (Signed)
Pharmacy requesting clarification on Januvia and Victoza.  Stating patient is either on both medications or going back and forth please advise

## 2012-01-30 NOTE — Telephone Encounter (Signed)
Pharmacy informed.

## 2012-01-31 ENCOUNTER — Ambulatory Visit (HOSPITAL_COMMUNITY)
Admission: RE | Admit: 2012-01-31 | Discharge: 2012-01-31 | Disposition: A | Discharge status: Home / Self Care | Payer: Medicare Other | Source: Ambulatory Visit | Attending: Gastroenterology | Admitting: Gastroenterology

## 2012-01-31 ENCOUNTER — Telehealth: Payer: Self-pay | Admitting: Gastroenterology

## 2012-01-31 DIAGNOSIS — IMO0002 Reserved for concepts with insufficient information to code with codable children: Secondary | ICD-10-CM | POA: Insufficient documentation

## 2012-01-31 DIAGNOSIS — R6881 Early satiety: Secondary | ICD-10-CM

## 2012-01-31 DIAGNOSIS — T182XXA Foreign body in stomach, initial encounter: Secondary | ICD-10-CM | POA: Insufficient documentation

## 2012-01-31 DIAGNOSIS — Z8711 Personal history of peptic ulcer disease: Secondary | ICD-10-CM | POA: Insufficient documentation

## 2012-01-31 DIAGNOSIS — E119 Type 2 diabetes mellitus without complications: Secondary | ICD-10-CM | POA: Insufficient documentation

## 2012-01-31 NOTE — Telephone Encounter (Signed)
Patient advised that I will call with the results once Dr Fuller Plan can review

## 2012-02-03 ENCOUNTER — Other Ambulatory Visit: Payer: Self-pay | Admitting: Gastroenterology

## 2012-02-03 ENCOUNTER — Other Ambulatory Visit: Payer: Self-pay

## 2012-02-03 ENCOUNTER — Encounter: Payer: Self-pay | Admitting: Gastroenterology

## 2012-02-03 NOTE — Telephone Encounter (Signed)
A user error has taken place: encounter opened in error, closed for administrative reasons.

## 2012-02-27 ENCOUNTER — Ambulatory Visit (INDEPENDENT_AMBULATORY_CARE_PROVIDER_SITE_OTHER): Payer: Medicare Other | Admitting: Endocrinology

## 2012-02-27 ENCOUNTER — Encounter: Payer: Self-pay | Admitting: Endocrinology

## 2012-02-27 VITALS — BP 122/72 | HR 75 | Temp 98.1°F | Wt 251.0 lb

## 2012-02-27 DIAGNOSIS — S8000XA Contusion of unspecified knee, initial encounter: Secondary | ICD-10-CM

## 2012-02-27 DIAGNOSIS — M549 Dorsalgia, unspecified: Secondary | ICD-10-CM

## 2012-02-27 MED ORDER — OXYCODONE-ACETAMINOPHEN 5-325 MG PO TABS: 1.0000 | ORAL_TABLET | Freq: Four times a day (QID) | ORAL | Status: DC | PRN
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
## 2012-02-27 NOTE — Patient Instructions (Signed)
Here is a refill of your pain medication. Rest I hope you feel better soon.  If you don't feel better by next week, please call back.

## 2012-03-03 ENCOUNTER — Other Ambulatory Visit: Payer: Self-pay | Admitting: Internal Medicine

## 2012-03-07 ENCOUNTER — Encounter: Payer: Self-pay | Admitting: Family Medicine

## 2012-03-07 ENCOUNTER — Ambulatory Visit (INDEPENDENT_AMBULATORY_CARE_PROVIDER_SITE_OTHER): Payer: Medicare Other | Admitting: Family Medicine

## 2012-03-07 VITALS — BP 140/80 | HR 96 | Temp 99.3°F | Ht 66.0 in | Wt 249.0 lb

## 2012-03-07 DIAGNOSIS — I10 Essential (primary) hypertension: Secondary | ICD-10-CM

## 2012-03-07 DIAGNOSIS — R51 Headache: Secondary | ICD-10-CM

## 2012-03-07 NOTE — Patient Instructions (Signed)
Increase lopressor to 1 tablet twice daily ...  50 mg twice daily. Push fluids. If headache or BP not improved in next few days.. Follow up with primary MD.

## 2012-03-07 NOTE — Progress Notes (Signed)
  Subjective:    Patient ID: [name], female    DOB: 13-Feb-1951, 60 y.o.   MRN: [id number]  HPI   61 year old female with history of migraine, DM, HTN, depression, CAD presents with  3 days of body aches, headache, yesterday shortness of breath.  SOB resolved today. No cough, no congestion. No fever. No ST. No wheeze.  Headache feels similar to migraine, she has tried [facility], percocet for pain, tylenol. No nausea, some phonophobia, no photophobia.  BP high today, high at home 150/90 DM is well controlled..post prandial last night 127.  Only taking 1 tab of lopressor a day.. Not 1 and 1./2 BID as supposed.Marland Kitchen She was confused about dose isse.  HR elevated today and at home.     Review of Systems  Constitutional: Negative for fever and fatigue.  HENT: Negative for ear pain.   Eyes: Negative for pain.  Respiratory: Positive for shortness of breath. Negative for chest tightness.   Cardiovascular: Negative for chest pain, palpitations and leg swelling.  Gastrointestinal: Negative for abdominal pain.  Genitourinary: Negative for dysuria.  Neurological: Positive for weakness and headaches. Negative for syncope, light-headedness and numbness.       Objective:   Physical Exam  Constitutional: Vital signs are normal. She appears well-developed and well-nourished. She is cooperative.  Non-toxic appearance. She does not appear ill. No distress.  HENT:  Head: Normocephalic.  Right Ear: Hearing, tympanic membrane, external ear and ear canal normal. Tympanic membrane is not erythematous, not retracted and not bulging.  Left Ear: Hearing, tympanic membrane, external ear and ear canal normal. Tympanic membrane is not erythematous, not retracted and not bulging.  Nose: No mucosal edema or rhinorrhea. Right sinus exhibits no maxillary sinus tenderness and no frontal sinus tenderness. Left sinus exhibits no maxillary sinus tenderness and no frontal sinus tenderness.  Mouth/Throat: Uvula is  midline, oropharynx is clear and moist and mucous membranes are normal.  Eyes: Conjunctivae, EOM and lids are normal. Pupils are equal, round, and reactive to light. No foreign bodies found.  Neck: Trachea normal and normal range of motion. Neck supple. Carotid bruit is not present. No mass and no thyromegaly present.  Cardiovascular: Normal rate, regular rhythm, S1 normal, S2 normal, normal heart sounds, intact distal pulses and normal pulses.  Exam reveals no gallop and no friction rub.   No murmur heard. Pulmonary/Chest: Effort normal and breath sounds normal. Not tachypneic. No respiratory distress. She has no decreased breath sounds. She has no wheezes. She has no rhonchi. She has no rales.  Abdominal: Soft. Normal appearance and bowel sounds are normal. There is no tenderness.  Neurological: She is alert. She has normal strength. No cranial nerve deficit or sensory deficit. She displays a negative Romberg sign. Gait normal.  Skin: Skin is warm, dry and intact. No rash noted.  Psychiatric: Her speech is normal and behavior is normal. Judgment and thought content normal. Her mood appears not anxious. Cognition and memory are normal. She does not exhibit a depressed mood.          Assessment & Plan:

## 2012-03-10 ENCOUNTER — Encounter: Payer: Self-pay | Admitting: Internal Medicine

## 2012-03-10 ENCOUNTER — Ambulatory Visit (INDEPENDENT_AMBULATORY_CARE_PROVIDER_SITE_OTHER): Payer: Medicare Other | Admitting: Internal Medicine

## 2012-03-10 VITALS — BP 122/72 | HR 80 | Temp 97.2°F | Ht 66.0 in | Wt 251.4 lb

## 2012-03-10 DIAGNOSIS — M542 Cervicalgia: Secondary | ICD-10-CM

## 2012-03-10 DIAGNOSIS — J069 Acute upper respiratory infection, unspecified: Secondary | ICD-10-CM

## 2012-03-10 DIAGNOSIS — R519 Headache, unspecified: Secondary | ICD-10-CM | POA: Insufficient documentation

## 2012-03-10 DIAGNOSIS — R51 Headache: Secondary | ICD-10-CM | POA: Insufficient documentation

## 2012-03-10 DIAGNOSIS — R42 Dizziness and giddiness: Secondary | ICD-10-CM | POA: Insufficient documentation

## 2012-03-10 MED ORDER — MECLIZINE HCL 12.5 MG PO TABS: 12.5000 mg | ORAL_TABLET | Freq: Three times a day (TID) | ORAL | Status: DC | PRN

## 2012-03-10 MED ORDER — LEVOFLOXACIN 250 MG PO TABS: 250.0000 mg | ORAL_TABLET | Freq: Every day | ORAL | Status: DC

## 2012-03-10 NOTE — Assessment & Plan Note (Signed)
May be due to viral illness, but BP elevated also. Will increase BP medication. If headache and symptoms not improving in 3-4 days. Follow up with primary MD.

## 2012-03-10 NOTE — Patient Instructions (Addendum)
Take all new medications as prescribed - the antibiotic Continue all other medications as before, including the meclizine refilled today, and the pain medication  You can also take Mucinex (or it's generic off brand) for congestion Please continue to monitor your neck and right shoulder/arm pain/weakness/numbness - if this becomes worse or does not go away, we might need to consider MRI of the neck

## 2012-03-10 NOTE — Assessment & Plan Note (Signed)
Poor control

## 2012-03-11 ENCOUNTER — Other Ambulatory Visit: Payer: Self-pay

## 2012-03-11 MED ORDER — MECLIZINE HCL 12.5 MG PO TABS: 12.5000 mg | ORAL_TABLET | Freq: Three times a day (TID) | ORAL | Status: DC | PRN

## 2012-03-11 MED ORDER — LEVOFLOXACIN 250 MG PO TABS: 250.0000 mg | ORAL_TABLET | Freq: Every day | ORAL | Status: AC

## 2012-03-11 NOTE — Telephone Encounter (Signed)
Pt called stating Rx from [place] 03/10/2012 where sent to the wrong pharmacy. Rxs resent to correct pharmacy per pt.

## 2012-03-13 ENCOUNTER — Other Ambulatory Visit: Payer: Self-pay | Admitting: *Deleted

## 2012-03-13 MED ORDER — SUCRALFATE 1 GM/10ML PO SUSP: ORAL | Status: DC

## 2012-03-15 ENCOUNTER — Encounter: Payer: Self-pay | Admitting: Internal Medicine
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
## 2012-03-15 NOTE — Assessment & Plan Note (Signed)
Mild to mod, for meclizine prn - most likely related to right middle ear,  to f/u any worsening symptoms or concerns

## 2012-03-15 NOTE — Assessment & Plan Note (Signed)
C/w right cervical radiculitis/radiculopathy, pt declines MRI , for pain med and f/u for persistence or worsening

## 2012-03-15 NOTE — Assessment & Plan Note (Signed)
Mild to mod, for antibx course,  to f/u any worsening symptoms or concerns, also for mucinex otc prn

## 2012-03-25 ENCOUNTER — Ambulatory Visit (INDEPENDENT_AMBULATORY_CARE_PROVIDER_SITE_OTHER): Payer: Medicare Other | Admitting: Internal Medicine

## 2012-03-25 ENCOUNTER — Encounter: Payer: Self-pay | Admitting: Internal Medicine

## 2012-03-25 VITALS — BP 130/70 | HR 80 | Temp 97.5°F | Ht 65.0 in | Wt 252.0 lb

## 2012-03-25 DIAGNOSIS — M545 Low back pain, unspecified: Secondary | ICD-10-CM

## 2012-03-25 DIAGNOSIS — R42 Dizziness and giddiness: Secondary | ICD-10-CM

## 2012-03-25 DIAGNOSIS — I10 Essential (primary) hypertension: Secondary | ICD-10-CM

## 2012-03-25 DIAGNOSIS — M542 Cervicalgia: Secondary | ICD-10-CM

## 2012-03-25 MED ORDER — OXYCODONE HCL 5 MG PO TABS: 5.0000 mg | ORAL_TABLET | Freq: Four times a day (QID) | ORAL | Status: AC | PRN

## 2012-03-25 MED ORDER — CYCLOBENZAPRINE HCL 5 MG PO TABS: 5.0000 mg | ORAL_TABLET | Freq: Three times a day (TID) | ORAL | Status: AC | PRN

## 2012-03-25 MED ORDER — PREDNISONE 10 MG PO TABS: ORAL_TABLET | ORAL | Status: DC

## 2012-03-25 MED ORDER — CYCLOBENZAPRINE HCL 5 MG PO TABS: 5.0000 mg | ORAL_TABLET | Freq: Three times a day (TID) | ORAL | Status: DC | PRN

## 2012-03-25 NOTE — Patient Instructions (Addendum)
Take all new medications as prescribed Continue all other medications as before  

## 2012-03-25 NOTE — Assessment & Plan Note (Signed)
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

## 2012-03-29 DIAGNOSIS — R42 Dizziness and giddiness: Secondary | ICD-10-CM | POA: Insufficient documentation
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
## 2012-03-29 NOTE — Assessment & Plan Note (Signed)
stable overall by hx and exam, most recent data reviewed with pt, and pt to continue medical treatment as before BP Readings from Last 3 Encounters:  03/25/12 130/70  03/10/12 122/72  03/07/12 140/80
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
## 2012-03-29 NOTE — Assessment & Plan Note (Signed)
Exam benign, ?BPV - for meclizine prn,  to f/u any worsening symptoms or concerns

## 2012-05-11 ENCOUNTER — Ambulatory Visit (INDEPENDENT_AMBULATORY_CARE_PROVIDER_SITE_OTHER): Payer: BC Managed Care – PPO | Admitting: Internal Medicine

## 2012-05-11 ENCOUNTER — Encounter: Payer: Self-pay | Admitting: Internal Medicine

## 2012-05-11 VITALS — BP 114/82 | HR 78 | Temp 97.9°F | Ht 66.0 in | Wt 256.5 lb

## 2012-05-11 DIAGNOSIS — G609 Hereditary and idiopathic neuropathy, unspecified: Secondary | ICD-10-CM

## 2012-05-11 DIAGNOSIS — M549 Dorsalgia, unspecified: Secondary | ICD-10-CM

## 2012-05-11 DIAGNOSIS — G629 Polyneuropathy, unspecified: Secondary | ICD-10-CM | POA: Insufficient documentation

## 2012-05-11 DIAGNOSIS — R109 Unspecified abdominal pain: Secondary | ICD-10-CM | POA: Insufficient documentation

## 2012-05-11 DIAGNOSIS — Z0000 Encounter for general adult medical examination without abnormal findings: Secondary | ICD-10-CM

## 2012-05-11 DIAGNOSIS — E785 Hyperlipidemia, unspecified: Secondary | ICD-10-CM

## 2012-05-11 DIAGNOSIS — F329 Major depressive disorder, single episode, unspecified: Secondary | ICD-10-CM

## 2012-05-11 DIAGNOSIS — E119 Type 2 diabetes mellitus without complications: Secondary | ICD-10-CM

## 2012-05-11 DIAGNOSIS — IMO0002 Reserved for concepts with insufficient information to code with codable children: Secondary | ICD-10-CM

## 2012-05-11 DIAGNOSIS — F3289 Other specified depressive episodes: Secondary | ICD-10-CM

## 2012-05-11 DIAGNOSIS — M5416 Radiculopathy, lumbar region: Secondary | ICD-10-CM

## 2012-05-11 DIAGNOSIS — I10 Essential (primary) hypertension: Secondary | ICD-10-CM

## 2012-05-11 MED ORDER — PROMETHAZINE HCL 25 MG PO TABS: ORAL_TABLET | ORAL | Status: DC

## 2012-05-11 MED ORDER — OXYCODONE HCL 5 MG PO TABS: ORAL_TABLET | ORAL | Status: DC

## 2012-05-11 MED ORDER — KETOROLAC TROMETHAMINE 30 MG/ML IJ SOLN: 30.0000 mg | Freq: Once | INTRAMUSCULAR | Status: AC

## 2012-05-11 NOTE — Progress Notes (Signed)
Subjective:    Patient ID: [name], female    DOB: [birth year], 61 y.o.   MRN: [id number]  HPI  Here to f/u;  Was seen early June here with lower back pain (she calls it "tailbone pain") and relatively benign neuro exam, with pain felt likely due more to lumbar DDD/DJD without plan for further eval felt needed but tx symptomatically;  Is right handed, and here with increased now more constant, mod to severe pain, worse to lie down on back or side, sitting, better to stand but readily gets tired with more prolonged standing after 10 min or so, and marked worsening of the pain with walking less than 50 ft, has to stop the rest to allow pain to decrease, and now pain assoc with RLE radiation to the knee, no numbness and not sure about weakness.  No other gait change or fall.  Pt denies chest pain, increased sob or doe, wheezing, orthopnea, PND, increased LE swelling, palpitations, dizziness or syncope.  Pt denies new neurological symptoms such as new headache, or facial or extremity weakness or numbness other than the above.   Pt denies polydipsia, polyuria.  Some nausea as well but Denies worsening reflux, dysphagia, abd pain, n/v, bowel change or blood.   Pt denies bowel or bladder change, fever, wt loss, or falls. Denies worsening depressive symptoms, suicidal ideation, or panic, though has ongoing anxiety  Past Medical History  Diagnosis Date  . COMMON MIGRAINE   . DIVERTICULOSIS-COLON   . CORONARY ARTERY DISEASE   . Gastroparesis   . INSOMNIA-SLEEP DISORDER-UNSPEC   . Iron deficiency anemia   . Anxiety   . DEPRESSION   . Diabetes mellitus, type II   . GERD (gastroesophageal reflux disease)   . Hyperlipidemia   . Hypertension   . Gastric ulcer 04/2008  . CERVICAL RADICULOPATHY, LEFT   . Hiatal hernia   . Hypothyroidism    Past Surgical History  Procedure Date  . Left knee replacement   . 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 Berenice Primas  . Gastric wedge resection lipoma  11/2007    x2 with laparotomy and gastrostomy  . Rotator cuff repair 01/2009  . Cholecystectomy 06/2009  . S/p bladder surgury 09/2009    Dr. Terance Hart  . Abdominal hysterectomy     reports that she has quit smoking. She has never used smokeless tobacco. She reports that she does not drink alcohol or use illicit drugs. family history includes Coronary artery disease in her other; Diabetes in her brother and sister; Heart disease in her brother and sister; and Hypertension in her other.  There is no history of Colon cancer. Allergies  Allergen Reactions  . Hydrocodone     REACTION: tachycardia  . Penicillins     Hives   Current Outpatient Prescriptions on File Prior to Visit  Medication Sig Dispense Refill  . aspirin 81 MG EC tablet Take 81 mg by mouth daily.        . bifidobacterium infantis (ALIGN) capsule Take 1 capsule by mouth daily.      Marland Kitchen estradiol (ESTRACE) 1 MG tablet Take 1 tablet (1 mg total) by mouth daily.  90 tablet  3  . EXFORGE 10-320 MG per tablet TAKE 1 TABLET DAILY  90 tablet  2  . furosemide (LASIX) 40 MG tablet Take 40 mg by mouth daily.        . isosorbide dinitrate (ISORDIL) 10 MG tablet Take 10 mg by mouth daily.       Marland Kitchen  JANUVIA 100 MG tablet TAKE 1 TABLET DAILY  90 tablet  3  . Levothyroxine Sodium (SYNTHROID PO) Take 1 tablet by mouth daily.        . Liraglutide (VICTOZA) 18 MG/3ML SOLN Inject 0.17 mLs (1.02 mg total) into the skin daily.  6 mL  1  . meclizine (ANTIVERT) 12.5 MG tablet Take 1 tablet (12.5 mg total) by mouth 3 (three) times daily as needed.  90 tablet  1  . metFORMIN (GLUCOPHAGE-XR) 500 MG 24 hr tablet TAKE 1 TABLET TWICE A DAY  180 tablet  2  . metoCLOPramide (REGLAN) 10 MG tablet TAKE 1 TABLET 30 MINUTES PRIOR TO MEALS  90 tablet  0  . metoprolol (LOPRESSOR) 50 MG tablet TAKE ONE AND ONE-HALF TABLETS (75 MG) TWICE A DAY  90 tablet  3  . omeprazole (PRILOSEC) 40 MG capsule Take 1 capsule (40 mg total) by mouth 2 (two) times daily.  60 capsule  11    . ondansetron (ZOFRAN ODT) 4 MG disintegrating tablet Take 1 tab every 6 hours as needed for nausea. Place tab on the tongue.  30 tablet  1  . polyethylene glycol powder (MIRALAX) powder Take 17 g by mouth daily. 1-3 times daily as needed to keep bowels regular  255 g  0  . predniSONE (DELTASONE) 10 MG tablet 3 tabs by mouth per day for 3 days,2tabs per day for 3 days,1tab per day for 3 days  18 tablet  0  . sertraline (ZOLOFT) 50 MG tablet TAKE 1 TABLET DAILY  90 tablet  1  . simvastatin (ZOCOR) 20 MG tablet TAKE 1 TABLET DAILY  90 tablet  2  . sucralfate (CARAFATE) 1 GM/10ML suspension Take 2 teaspoons three times daily before meals.  2700 mL  0  . DISCONTD: promethazine (PHENERGAN) 25 MG tablet Take 1/2 to one tablet by mouth every 6 hours as needed for nausea and vomiting  30 tablet  0  . gabapentin (NEURONTIN) 300 MG capsule Take 1 capsule (300 mg total) by mouth at bedtime.  30 capsule  2  . isosorbide mononitrate (IMDUR) 30 MG 24 hr tablet Take 1 tablet (30 mg total) by mouth daily.  30 tablet  11   No current facility-administered medications on file prior to visit.   Review of Systems Review of Systems  Constitutional: Negative for diaphoresis and unexpected weight change.  HENT: Negative for drooling and tinnitus.   Eyes: Negative for photophobia and visual disturbance.  Respiratory: Negative for choking and stridor.   Gastrointestinal: Negative for vomiting and blood in stool.  Genitourinary: Negative for hematuria and decreased urine volume.  Musculoskeletal: Negative for gait problem.  Skin: Negative for color change and wound.  Neurological: Negative for tremors and numbness.  Psychiatric/Behavioral: Negative for decreased concentration. The patient is not hyperactive.      Objective:   Physical Exam BP 114/82  Pulse 78  Temp 97.9 F (36.6 C) (Oral)  Ht 5\' 6"  (1.676 m)  Wt 256 lb 8 oz (116.348 kg)  BMI 41.40 kg/m2  SpO2 96% Physical Exam  VS noted, not ill  appearing Constitutional: Pt appears well-developed and well-nourished.  HENT: Head: Normocephalic.  Right Ear: External ear normal.  Left Ear: External ear normal.  Eyes: Conjunctivae and EOM are normal. Pupils are equal, round, and reactive to light.  Neck: Normal range of motion. Neck supple.  Cardiovascular: Normal rate and regular rhythm.   Pulmonary/Chest: Effort normal and breath sounds normal.  Abd:  Soft,  NT, non-distended, + BS Neurological: Pt is alert. No cranial nerve deficit. Motor 4+/5 distal RLE weakness, with diminshed right patellar reflex (also s/p right knee replacement however), trace bilat achilles reflexes, sens intact, gait not tested Spine;  No sacral tender, but has low lumbar tender and right paravertebral tender without swelling, redness, rash  Skin: Skin is warm. No erythema. No rash Psychiatric: Pt behavior is normal. Thought content normal. but remains dysphoric appearing    Assessment & Plan:

## 2012-05-11 NOTE — Assessment & Plan Note (Signed)
stable overall by hx and exam, most recent data reviewed with pt, and pt to continue medical treatment as before Lab Results  Component Value Date   LDLCALC 74 04/04/2011

## 2012-05-11 NOTE — Assessment & Plan Note (Signed)
afeb but has rather marked pain to ruq and is s/p ccx, for labs and u/s asap, vicodin prn

## 2012-05-11 NOTE — Assessment & Plan Note (Signed)
stable overall by hx and exam, most recent data reviewed with pt, and pt to continue medical treatment as before Lab Results  Component Value Date   HGBA1C 6.0 04/04/2011   For lab today

## 2012-05-11 NOTE — Patient Instructions (Addendum)
Take all new medications as prescribed Continue all other medications as before Your phenergan was also refilled You will be contacted regarding the referral for: MRI for the lower back Depending on the results, you may need to see some type of surgeon for the back, possibly orthopedic or neurosurgury Please return in 3 mo with Lab testing done 3-5 days before

## 2012-05-11 NOTE — Assessment & Plan Note (Addendum)
I think should increase the zoloft, but declines at this time

## 2012-05-11 NOTE — Assessment & Plan Note (Signed)
Worsening neuro exam, d/w pt who seems unaware of the RLE weakness, for MRI LS Spine, pain control, but declines surgical referral at this time - wants one step at a time, though I think she should be referred to ortho or neurosurgury

## 2012-05-11 NOTE — Assessment & Plan Note (Signed)
stable overall by hx and exam, most recent data reviewed with pt, and pt to continue medical treatment as before BP Readings from Last 3 Encounters:  05/11/12 114/82  03/25/12 130/70  03/10/12 122/72

## 2012-05-11 NOTE — Assessment & Plan Note (Signed)
?   msk vs urinary tract related vs other - for urine studies, empiric cipro, gave toradol 30 mg/phenergan IM today per pt request

## 2012-05-11 NOTE — Assessment & Plan Note (Signed)
With pain, per podiatry, for cymbalta 30 for 1 wk, then 60 after, gave 3 wks samples and free month coupon as well, should also hopefully help for DJD and FMS and anxiety/depression for her as well

## 2012-05-16 ENCOUNTER — Ambulatory Visit
Admission: RE | Admit: 2012-05-16 | Discharge: 2012-05-16 | Disposition: A | Discharge status: Home / Self Care | Payer: BC Managed Care – PPO | Source: Ambulatory Visit | Attending: Internal Medicine | Admitting: Internal Medicine

## 2012-05-16 DIAGNOSIS — M5416 Radiculopathy, lumbar region: Secondary | ICD-10-CM

## 2012-05-17 ENCOUNTER — Encounter: Payer: Self-pay | Admitting: Internal Medicine

## 2012-05-19 ENCOUNTER — Telehealth: Payer: Self-pay | Admitting: Internal Medicine

## 2012-05-19 DIAGNOSIS — M5416 Radiculopathy, lumbar region: Secondary | ICD-10-CM

## 2012-05-19 NOTE — Telephone Encounter (Signed)
Message copied by Biagio Borg on Tue May 19, 2012 10:24 AM ------      Message from: Jamesetta Orleans      Created: Tue May 19, 2012  9:51 AM       Called the patient informed of results.  The patient would like a referral to Dr. Berenice Primas

## 2012-05-19 NOTE — Telephone Encounter (Signed)
Done per emr 

## 2012-06-11 ENCOUNTER — Other Ambulatory Visit: Payer: Self-pay | Admitting: Internal Medicine

## 2012-06-29 ENCOUNTER — Telehealth: Payer: Self-pay | Admitting: Gastroenterology

## 2012-06-29 NOTE — Telephone Encounter (Signed)
Patient has continued abdominal pain.  She wants to come in and see Dr. Fuller Plan.  I have scheduled an appt for 07/20/12.  I have also placed her on the cancellation list

## 2012-07-20 ENCOUNTER — Ambulatory Visit (INDEPENDENT_AMBULATORY_CARE_PROVIDER_SITE_OTHER): Payer: Medicare Other | Admitting: Gastroenterology

## 2012-07-20 ENCOUNTER — Encounter: Payer: Self-pay | Admitting: Gastroenterology

## 2012-07-20 VITALS — BP 132/78 | HR 76 | Ht 65.0 in | Wt 252.8 lb

## 2012-07-20 DIAGNOSIS — K3184 Gastroparesis: Secondary | ICD-10-CM

## 2012-07-20 DIAGNOSIS — R112 Nausea with vomiting, unspecified: Secondary | ICD-10-CM

## 2012-07-20 MED ORDER — AMBULATORY NON FORMULARY MEDICATION: Status: DC

## 2012-07-20 NOTE — Progress Notes (Signed)
History of Present Illness: This is a 61 year old female accompanied by her husband. Gastroparesis. She underwent upper endoscopy and upper GI series in April 2013 Upper GI series as outlined below. She tolerates liquid foods fairly well with solid foods lead to nausea and frequent vomiting despite remaining on metoclopramide, promethazine and ondansetron.  1. Large amount of retained food contents in the stomach. The  patient reports her last meal consisting of chicken noodle soup  yesterday. Findings may be related to prior vagotomy and/or  diabetes mellitus.  2. No fixed stricture identified status post partial gastrectomy.  The duodenum appears normal without evidence of recurrent peptic  ulcer disease.  Current Medications, Allergies, Past Medical History, Past Surgical History, Family History and Social History were reviewed in [employer] record.  Physical Exam: General: Well developed , well nourished, no acute distress Head: Normocephalic and atraumatic Eyes:  sclerae anicteric, EOMI Ears: Normal auditory acuity Mouth: No deformity or lesions Lungs: Clear throughout to auscultation Heart: Regular rate and rhythm; no murmurs, rubs or bruits Abdomen: Soft, non tender and non distended. No masses, hepatosplenomegaly or hernias noted. Normal Bowel sounds Musculoskeletal: Symmetrical with no gross deformities  Pulses:  Normal pulses noted Extremities: No clubbing, cyanosis, edema or deformities noted Neurological: Alert oriented x 4, grossly nonfocal Psychological:  Alert and cooperative. Normal mood and affect  Assessment and Recommendations:  1. Gastroparesis with refractory symptoms. Frequent nausea and occasional vomiting. I encouraged her to have a liquids and soft foods and to avoid solid foods. Trial of domperidone 10 mg a.c. and at bedtime. Discontinue metoclopramide. Continue promethazine when necessary and ondansetron when necessary. Symptoms do not  improve with the above regimen consider referral to Dr. Derrill Kay at [facility].

## 2012-07-20 NOTE — Patient Instructions (Addendum)
[facility] in [place] will be contacting you regarding your prescription for Domperidone. They will send you your prescription in the mail after they have contacted you.  When you start taking Domperidone, please stop taking your Reglan/metoclopramide.

## 2012-07-28 ENCOUNTER — Other Ambulatory Visit: Payer: Self-pay | Admitting: Internal Medicine

## 2012-08-07 ENCOUNTER — Other Ambulatory Visit: Payer: Self-pay | Admitting: Internal Medicine

## 2012-08-14 ENCOUNTER — Other Ambulatory Visit: Payer: Self-pay | Admitting: Physician Assistant

## 2012-08-17 NOTE — Telephone Encounter (Signed)
Refill x one is fine

## 2012-08-19 ENCOUNTER — Other Ambulatory Visit: Payer: Self-pay | Admitting: *Deleted

## 2012-08-19 NOTE — Telephone Encounter (Signed)
I called the patient to advise I spoke to the pharmacist.  [facility]  The pharmacist said that her ins wouldn't cover the Zofran SL ODT.  I asked about the generic reg tablet and he said with the [facility] card it would be 9.98.  I told him to fill that prescription and I called the pt to advise. She was good with that.

## 2012-08-20 ENCOUNTER — Ambulatory Visit: Payer: Medicare Other | Admitting: Internal Medicine

## 2012-08-20 DIAGNOSIS — Z0289 Encounter for other administrative examinations: Secondary | ICD-10-CM

## 2012-08-31 ENCOUNTER — Ambulatory Visit (INDEPENDENT_AMBULATORY_CARE_PROVIDER_SITE_OTHER): Payer: Medicare Other | Admitting: Gastroenterology

## 2012-08-31 ENCOUNTER — Encounter: Payer: Self-pay | Admitting: Gastroenterology

## 2012-08-31 VITALS — BP 116/78 | HR 80 | Ht 66.0 in | Wt 251.2 lb

## 2012-08-31 DIAGNOSIS — K3184 Gastroparesis: Secondary | ICD-10-CM

## 2012-08-31 MED ORDER — AMBULATORY NON FORMULARY MEDICATION: Status: DC

## 2012-08-31 NOTE — Progress Notes (Signed)
History of Present Illness: This is a 61 year old female accompanied by her husband. She reports urgent, loose stools since beginning domperidone. She decreased her dosing from 4 times a day to 3 times a day and her symptoms improved however they persist. She states her nausea and vomiting has not improved.  Current Medications, Allergies, Past Medical History, Past Surgical History, Family History and Social History were reviewed in [employer] record.  Physical Exam: General: Well developed , well nourished, obese, no acute distress Head: Normocephalic and atraumatic Eyes:  sclerae anicteric, EOMI Ears: Normal auditory acuity Mouth: No deformity or lesions Lungs: Clear throughout to auscultation Heart: Regular rate and rhythm; no murmurs, rubs or bruits Abdomen: Soft, non tender and non distended. No masses, hepatosplenomegaly or hernias noted. Normal Bowel sounds Musculoskeletal: Symmetrical with no gross deformities  Pulses:  Normal pulses noted Extremities: No clubbing, cyanosis, edema or deformities noted Neurological: Alert oriented x 4, grossly nonfocal Psychological:  Alert and cooperative. Depressed affect  Assessment and Recommendations:  1. Gastroparesis. Domperidone for the past several weeks has lead to urgent loose stools which improved and decreasing the dosage. Her nausea and vomiting has not changed. Decreased domperidone to twice a day taken before her 2 largest meals during the day. Attempt to avoid narcotic pain medications and anticholinergics. Referral to [place].

## 2012-08-31 NOTE — Patient Instructions (Addendum)
Decrease your Domperidone to 10 mg one tablet by mouth twice a day before meals.  We will contact you with your referral appointment to [facility] with Dr. Derrill Kay.

## 2012-09-01 ENCOUNTER — Telehealth: Payer: Self-pay

## 2012-09-01 NOTE — Telephone Encounter (Signed)
Spoke with patient and informed her of her appointment with Dr. Derrill Kay on 12/30/12 at 10:00am. Pt verbalized understanding. All records faxed to [facility].

## 2012-09-02 ENCOUNTER — Telehealth: Payer: Self-pay | Admitting: Gastroenterology

## 2012-09-02 NOTE — Telephone Encounter (Signed)
Per office note of Dr. Fuller Plan on 08/31/12 avoid all narcotics and anticholinergics.  Patient reports she is having a "gripping " abdominal pain.  She has been referred to [facility] for her nausea and vomiting but appt is not until March.  She is advised that Dr. Fuller Plan recommended avoiding all narcotics and anticholinergics.   She is advised that the office is closed for the holiday and that I will see what non- narcotic pain medication he would recommend..  She understands she will not get a return call until Monday 09/07/22, and if the pain is worse or symptoms worsen to see her primary care or ER.

## 2012-09-04 NOTE — Telephone Encounter (Signed)
She needs to check with her PCP or prescribing MD for pain management. She can use her current pain meds until then. Any way she can get on a cancellation list or any other way to get a sooner appt with Dr. Derrill Kay?

## 2012-09-07 NOTE — Telephone Encounter (Signed)
Left message for patient to call back I have contacted [facility] and they have added her to the wait list for an earlier appt.

## 2012-09-09 ENCOUNTER — Other Ambulatory Visit: Payer: Self-pay

## 2012-09-09 MED ORDER — AMLODIPINE BESYLATE-VALSARTAN 10-320 MG PO TABS: 1.0000 | ORAL_TABLET | Freq: Every day | ORAL | Status: DC

## 2012-09-09 MED ORDER — ISOSORBIDE DINITRATE 10 MG PO TABS: 10.0000 mg | ORAL_TABLET | Freq: Every day | ORAL | Status: DC

## 2012-09-09 NOTE — Telephone Encounter (Signed)
Patient advised.

## 2012-09-21 ENCOUNTER — Telehealth: Payer: Self-pay | Admitting: Internal Medicine

## 2012-09-21 NOTE — Telephone Encounter (Signed)
[name] is calling because she says her pharmacy has tried to contact office regarding an insurance change that is preventing her to get refills of her Exforge but they are telling her that they are not getting a call back.  Pharmacy is [facility] [employer].

## 2012-09-22 NOTE — Telephone Encounter (Signed)
Called the pharmacy this am and they are going to fax PA form to our office today 09/22/12.

## 2012-09-23 MED ORDER — IRBESARTAN 300 MG PO TABS: 300.0000 mg | ORAL_TABLET | Freq: Every day | ORAL | Status: DC

## 2012-09-23 MED ORDER — AMLODIPINE BESYLATE 10 MG PO TABS: 10.0000 mg | ORAL_TABLET | Freq: Every day | ORAL | Status: DC

## 2012-09-23 MED ORDER — VALSARTAN 320 MG PO TABS: 320.0000 mg | ORAL_TABLET | Freq: Every day | ORAL | Status: DC

## 2012-09-23 NOTE — Telephone Encounter (Signed)
Please advise on PA.  Tried to get approval but unable.  Called the pharmacy and pharmacist suggested changing medication to Diovan since is now generic.  Please advise

## 2012-09-23 NOTE — Telephone Encounter (Signed)
Instead of brand name diovan, we can change this to generic avapro 300 qd

## 2012-09-23 NOTE — Telephone Encounter (Signed)
Pharmacist called to inform the patients insurance will not cover Diovan.  BUT he informed the Diovan with HCTZ if the only one that is generic and her insurance will pay for that, plain Diovan is not generic.

## 2012-09-23 NOTE — Telephone Encounter (Signed)
Ok to change the exforge to:  amlod 10 qd, plus diovan 320 qd - done erx

## 2012-09-29 ENCOUNTER — Telehealth: Payer: Self-pay | Admitting: Gastroenterology

## 2012-09-29 NOTE — Telephone Encounter (Signed)
Please call to let them know I request an earlier appt. Thx.

## 2012-09-29 NOTE — Telephone Encounter (Signed)
Patient's husband calling because he is concerned that patient is getting weaker and not any better. He states he called Dr. Alyson Ingles office to see if she could get an earlier appointment and was told that if Dr. Fuller Plan called over and requested an earlier appointment, she could be moved up. Please, advise.

## 2012-10-01 NOTE — Telephone Encounter (Signed)
Patient advised that the appt has been moved up with Dr. Roney Mans at 2:00 on 12/14/12.  They have placed her on all cancellation lists and will contact her with an earlier appt if possible.

## 2012-10-04 ENCOUNTER — Other Ambulatory Visit: Payer: Self-pay | Admitting: Internal Medicine

## 2012-10-12 ENCOUNTER — Ambulatory Visit (INDEPENDENT_AMBULATORY_CARE_PROVIDER_SITE_OTHER): Payer: Medicare Other | Admitting: Internal Medicine

## 2012-10-12 ENCOUNTER — Other Ambulatory Visit: Payer: Self-pay | Admitting: Internal Medicine

## 2012-10-12 ENCOUNTER — Encounter: Payer: Self-pay | Admitting: Internal Medicine

## 2012-10-12 ENCOUNTER — Other Ambulatory Visit (INDEPENDENT_AMBULATORY_CARE_PROVIDER_SITE_OTHER): Payer: Medicare Other

## 2012-10-12 VITALS — BP 120/72 | HR 62 | Temp 97.6°F | Ht 65.0 in | Wt 253.0 lb

## 2012-10-12 DIAGNOSIS — F329 Major depressive disorder, single episode, unspecified: Secondary | ICD-10-CM

## 2012-10-12 DIAGNOSIS — Z0000 Encounter for general adult medical examination without abnormal findings: Secondary | ICD-10-CM

## 2012-10-12 DIAGNOSIS — E119 Type 2 diabetes mellitus without complications: Secondary | ICD-10-CM

## 2012-10-12 DIAGNOSIS — R109 Unspecified abdominal pain: Secondary | ICD-10-CM

## 2012-10-12 DIAGNOSIS — F3289 Other specified depressive episodes: Secondary | ICD-10-CM

## 2012-10-12 DIAGNOSIS — I10 Essential (primary) hypertension: Secondary | ICD-10-CM

## 2012-10-12 LAB — BASIC METABOLIC PANEL
BUN: 10 mg/dL (ref 6–23)
CO2: 26 mEq/L (ref 19–32)
Calcium: 9.3 mg/dL (ref 8.4–10.5)
Chloride: 109 mEq/L (ref 96–112)
Creatinine, Ser: 1.1 mg/dL (ref 0.4–1.2)
GFR: 62.62 mL/min (ref >60.00)
Glucose, Bld: 160 mg/dL — ABNORMAL HIGH (ref 70–99)
Potassium: 4.8 mEq/L (ref 3.5–5.1)
Sodium: 142 mEq/L (ref 135–145)

## 2012-10-12 LAB — LIPID PANEL
Cholesterol: 160 mg/dL (ref 0–200)
HDL: 68.8 mg/dL (ref >39.00)
LDL Cholesterol: 72 mg/dL (ref 0–99)
Total CHOL/HDL Ratio: 2
Triglycerides: 94 mg/dL (ref 0.0–149.0)
VLDL: 18.8 mg/dL (ref 0.0–40.0)

## 2012-10-12 LAB — HEPATIC FUNCTION PANEL
ALT: 8 U/L (ref 0–35)
AST: 14 U/L (ref 0–37)
Albumin: 3.3 g/dL — ABNORMAL LOW (ref 3.5–5.2)
Alkaline Phosphatase: 55 U/L (ref 39–117)
Bilirubin, Direct: 0.2 mg/dL (ref 0.0–0.3)
Total Bilirubin: 1.1 mg/dL (ref 0.3–1.2)
Total Protein: 7.2 g/dL (ref 6.0–8.3)

## 2012-10-12 LAB — URINALYSIS, ROUTINE W REFLEX MICROSCOPIC
Hgb urine dipstick: NEGATIVE
Ketones, ur: NEGATIVE
Nitrite: POSITIVE
Specific Gravity, Urine: >=1.03 (ref 1.000–1.030)
Total Protein, Urine: NEGATIVE
Urine Glucose: NEGATIVE
Urobilinogen, UA: 0.2 (ref 0.0–1.0)
pH: 6 (ref 5.0–8.0)

## 2012-10-12 LAB — MICROALBUMIN / CREATININE URINE RATIO
Creatinine,U: 451.9 mg/dL
Microalb Creat Ratio: 0.4 mg/g (ref 0.0–30.0)
Microalb, Ur: 1.8 mg/dL (ref 0.0–1.9)

## 2012-10-12 LAB — HEMOGLOBIN A1C: Hgb A1c MFr Bld: 6.3 % (ref 4.6–6.5)

## 2012-10-12 LAB — TSH: TSH: 1.18 u[IU]/mL (ref 0.35–5.50)

## 2012-10-12 LAB — CBC WITH DIFFERENTIAL/PLATELET
Basophils Absolute: 0 10*3/uL (ref 0.0–0.1)
Basophils Relative: 0.5 % (ref 0.0–3.0)
Eosinophils Absolute: 0.3 10*3/uL (ref 0.0–0.7)
Eosinophils Relative: 3.8 % (ref 0.0–5.0)
HCT: 31.7 % — ABNORMAL LOW (ref 36.0–46.0)
Hemoglobin: 10.4 g/dL — ABNORMAL LOW (ref 12.0–15.0)
Lymphocytes Relative: 28.8 % (ref 12.0–46.0)
Lymphs Abs: 2 10*3/uL (ref 0.7–4.0)
MCHC: 33 g/dL (ref 30.0–36.0)
MCV: 94.3 fl (ref 78.0–100.0)
Monocytes Absolute: 0.3 10*3/uL (ref 0.1–1.0)
Monocytes Relative: 4.2 % (ref 3.0–12.0)
Neutro Abs: 4.3 10*3/uL (ref 1.4–7.7)
Neutrophils Relative %: 62.7 % (ref 43.0–77.0)
Platelets: 210 10*3/uL (ref 150.0–400.0)
RBC: 3.36 Mil/uL — ABNORMAL LOW (ref 3.87–5.11)
RDW: 13.5 % (ref 11.5–14.6)
WBC: 6.9 10*3/uL (ref 4.5–10.5)

## 2012-10-12 LAB — LIPASE: Lipase: 34 U/L (ref 11.0–59.0)

## 2012-10-12 MED ORDER — FLUOXETINE HCL 20 MG PO CAPS: 20.0000 mg | ORAL_CAPSULE | Freq: Every day | ORAL | Status: DC

## 2012-10-12 MED ORDER — CIPROFLOXACIN HCL 500 MG PO TABS: 500.0000 mg | ORAL_TABLET | Freq: Two times a day (BID) | ORAL | Status: DC

## 2012-10-12 NOTE — Assessment & Plan Note (Signed)
stable overall by hx and exam, most recent data reviewed with pt, and pt to continue medical treatment as before Lab Results  Component Value Date   HGBA1C 6.3 10/12/2012

## 2012-10-12 NOTE — Assessment & Plan Note (Signed)
Ok for change zoloft to prozac as may help with energy, also more tearful lately, denies suicidal ideation, delcines cousneling

## 2012-10-12 NOTE — Progress Notes (Signed)
Subjective:    Patient ID: [name], female    DOB: 1951/03/31, 62 y.o.   MRN: [id number]  HPI  Here for wellness and f/u;  Overall doing ok;  Pt denies CP, worsening SOB, DOE, wheezing, orthopnea, PND, worsening LE edema, palpitations, dizziness or syncope.  Pt denies neurological change such as new Headache, facial or extremity weakness.  Pt denies polydipsia, polyuria, or low sugar symptoms. Pt states overall good compliance with treatment and medications, good tolerability, and trying to follow lower cholesterol diet.  Pt denies worsening depressive symptoms, suicidal ideation or panic. No fever, wt loss, night sweats, loss of appetite, or other constitutional symptoms.  Pt states good ability with ADL's, low fall risk, home safety reviewed and adequate, no significant changes in hearing or vision, and occasionally active with exercise.  Has had ongoing abd pain with occas vomiting and diarrhea o/w unexplained, has fu appt with GI at [facility] in March. Does have sense of ongoing fatigue, but denies signficant hypersomnolence.  Would like to try change of zoloft to prozac as it is more activating  Past Medical History  Diagnosis Date  . COMMON MIGRAINE   . DIVERTICULOSIS-COLON   . CORONARY ARTERY DISEASE   . Gastroparesis   . INSOMNIA-SLEEP DISORDER-UNSPEC   . Iron deficiency anemia   . Anxiety   . DEPRESSION   . Diabetes mellitus, type II   . GERD (gastroesophageal reflux disease)   . Hyperlipidemia   . Hypertension   . Gastric ulcer 04/2008  . CERVICAL RADICULOPATHY, LEFT   . Hiatal hernia   . Hypothyroidism    Past Surgical History  Procedure Date  . Left knee replacement   . 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 Berenice Primas  . 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 Rotator cuff repair 01/2009  . Cholecystectomy 06/2009  . S/p bladder surgury 09/2009    Dr. Terance Hart  . Abdominal hysterectomy     reports that she has quit  smoking. She has never used smokeless tobacco. She reports that she does not drink alcohol or use illicit drugs. family history includes Coronary artery disease in her other; Diabetes in her brother and sister; Heart disease in her brother and sister; and Hypertension in her other.  There is no history of Colon cancer. Allergies  Allergen Reactions  . Hydrocodone     REACTION: tachycardia  . Penicillins     Hives    Current Outpatient Prescriptions on File Prior to Visit  Medication Sig Dispense Refill  . AMBULATORY NON FORMULARY MEDICATION Domperidone 10 mg    Take one tablet by mouth twice a day before meals  120 tablet  3  . amLODipine (NORVASC) 10 MG tablet Take 1 tablet (10 mg total) by mouth daily.  90 tablet  2  . aspirin 81 MG EC tablet Take 81 mg by mouth daily.        Marland Kitchen estradiol (ESTRACE) 1 MG tablet TAKE 1 TABLET DAILY  90 tablet  1  . furosemide (LASIX) 40 MG tablet Take 40 mg by mouth daily.        . irbesartan (AVAPRO) 300 MG tablet Take 1 tablet (300 mg total) by mouth daily.  90 tablet  2  . isosorbide dinitrate (ISORDIL) 10 MG tablet Take 1 tablet (10 mg total) by mouth daily.  90 tablet  3  . JANUVIA 100 MG tablet TAKE 1 TABLET DAILY  90 tablet  1  . Levothyroxine Sodium (SYNTHROID PO) Take 1 tablet by mouth daily.        . Liraglutide (VICTOZA) 18 MG/3ML SOLN Inject 0.17 mLs (1.02 mg total) into the skin daily.  6 mL  1  . metoprolol (LOPRESSOR) 50 MG tablet TAKE ONE AND ONE-HALF TABLETS (75 MG) TWICE A DAY  90 tablet  3  . ondansetron (ZOFRAN-ODT) 4 MG disintegrating tablet Take 1 tab every 6 hours as needed for nausea.  30 tablet  0  . oxyCODONE (OXY IR/ROXICODONE) 5 MG immediate release tablet 1-2 tabs by mouth every 4-6 hrs for pain, with maximum 6 tabs per day  100 tablet  0  . predniSONE (DELTASONE) 10 MG tablet 3 tabs by mouth per day for 3 days,2tabs per day for 3 days,1tab per day for 3 days  18 tablet  0  . promethazine (PHENERGAN) 25 MG tablet Take 1/2 to one  tablet by mouth every 6 hours as needed for nausea and vomiting  30 tablet  2  . simvastatin (ZOCOR) 20 MG tablet TAKE 1 TABLET BY MOUTH DAILY  90 tablet  1  . sucralfate (CARAFATE) 1 GM/10ML suspension Take 2 teaspoons three times daily before meals.  2700 mL  0  . FLUoxetine (PROZAC) 20 MG capsule Take 1 capsule (20 mg total) by mouth daily.  90 capsule  3  . isosorbide mononitrate (IMDUR) 30 MG 24 hr tablet Take 1 tablet (30 mg total) by mouth daily.  30 tablet  11  . meclizine (ANTIVERT) 12.5 MG tablet Take 1 tablet (12.5 mg total) by mouth 3 (three) times daily as needed.  90 tablet  1  . metFORMIN (GLUCOPHAGE-XR) 500 MG 24 hr tablet TAKE 1 TABLET TWICE A DAY  180 tablet  2   Review of Systems  Review of Systems  Constitutional: Negative for diaphoresis, activity change, appetite change and unexpected weight change.  HENT: Negative for hearing loss, ear pain, facial swelling, mouth sores and neck stiffness.   Eyes: Negative for pain, redness and visual disturbance.  Respiratory: Negative for shortness of breath and wheezing.   Cardiovascular: Negative for chest pain and palpitations.  Gastrointestinal: Negative for diarrhea, blood in stool, abdominal distention and rectal pain.  Genitourinary: Negative for hematuria, flank pain and decreased urine volume.  Musculoskeletal: Negative for myalgias and joint swelling.  Skin: Negative for color change and wound.  Neurological: Negative for syncope and numbness.  Hematological: Negative for adenopathy.  Psychiatric/Behavioral: Negative for hallucinations, self-injury, decreased concentration and agitation.      Objective:   Physical Exam BP 120/72  Pulse 62  Temp 97.6 F (36.4 C) (Oral)  Ht 5\' 5"  (1.651 m)  Wt 253 lb (114.76 kg)  BMI 42.10 kg/m2  SpO2 97% Physical Exam  VS noted Constitutional: Pt is oriented to person, place, and time. Appears well-developed and well-nourished.  HENT:  Head: Normocephalic and atraumatic.  Right  Ear: External ear normal.  Left Ear: External ear normal.  Nose: Nose normal.  Mouth/Throat: Oropharynx is clear and moist.  Eyes: Conjunctivae and EOM are normal. Pupils are equal, round, and reactive to light.  Neck: Normal range of motion. Neck supple. No JVD present. No tracheal deviation present.  Cardiovascular: Normal rate, regular rhythm, normal heart sounds and intact distal pulses.   Pulmonary/Chest: Effort normal and breath sounds normal.  Abdominal: Soft. Bowel sounds are normal. There is no tenderness.  Musculoskeletal: Normal range of motion. Exhibits no edema.  Lymphadenopathy:  Has no cervical  adenopathy.  Neurological: Pt is alert and oriented to person, place, and time. Pt has normal reflexes. No cranial nerve deficit.  Skin: Skin is warm and dry. No rash noted.  Psychiatric:  Has  normal mood and affect. Behavior is normal.     Assessment & Plan:

## 2012-10-12 NOTE — Assessment & Plan Note (Signed)
Unclear etiology, also for lipase and UA

## 2012-10-12 NOTE — Assessment & Plan Note (Signed)

## 2012-10-12 NOTE — Assessment & Plan Note (Signed)
stable overall by hx and exam, most recent data reviewed with pt, and pt to continue medical treatment as before BP Readings from Last 3 Encounters:  10/12/12 120/72  08/31/12 116/78  07/20/12 132/78

## 2012-10-12 NOTE — Patient Instructions (Addendum)
OK to stop the zoloft Take all new medications as prescribed - the generic prozac 20 mg per day Continue all other medications as before Please go to LAB in the Basement for the blood and/or urine tests to be done today, including the lipase You will be contacted by phone if any changes need to be made immediately.  Otherwise, you will receive a letter about your results with an explanation, but please check with MyChart first. Please remember to sign up for My Chart at your earliest convenience, as this will be important to you in the future with finding out test results. Please keep your appointments with your specialists as you have planned - Dr Derrill Kay at [facility] in March 2014 Please return in 6 mo with Lab testing done 3-5 days before

## 2012-10-13 ENCOUNTER — Telehealth: Payer: Self-pay

## 2012-10-13 NOTE — Telephone Encounter (Signed)
Patient informed. 

## 2012-10-13 NOTE — Telephone Encounter (Signed)
Patient informed of MD's response.  She stated she can't see Dr. Fuller Plan until March and what should she do until then as pain has increased

## 2012-10-13 NOTE — Telephone Encounter (Signed)
Called left message to call back 

## 2012-10-13 NOTE — Telephone Encounter (Signed)
I dont have any answer except to take the antibx as this could help for that type of pain

## 2012-10-13 NOTE — Telephone Encounter (Signed)
Message copied by Jamesetta Orleans on Tue Oct 13, 2012  8:49 AM ------      Message from: Biagio Borg      Created: Mon Oct 12, 2012  4:57 PM       unfortuanately I have nothing else to offer at this time, needs to see the GI as planned, or ask Dr Fuller Plan      ----- Message -----         From: Donell Sievert Zurri Rudden         Sent: 10/12/2012   3:21 PM           To: Biagio Borg, MD            The patient would like something sent in to [facility] for her stomach pain

## 2012-10-21 ENCOUNTER — Other Ambulatory Visit: Payer: Self-pay | Admitting: Physician Assistant

## 2012-11-18 ENCOUNTER — Telehealth: Payer: Self-pay | Admitting: Gastroenterology

## 2012-11-18 ENCOUNTER — Telehealth: Payer: Self-pay | Admitting: Internal Medicine

## 2012-11-18 NOTE — Telephone Encounter (Signed)
Patient Information:  Caller Name: [name]  Phone: [phone]  Patient: [name], [name]  Gender: Female  DOB: November 21, 1950  Age: 62 Years  PCP: Cathlean Cower (Adults only)  Office Follow Up:  Does the office need to follow up with this patient?: Yes  Instructions For The Office: Please review, call patient with Lab results, directions for procedure.  Can be reached at [phone]  RN Note:  Office closed for weather, urged to go to ER. Says is inside and comfortable, will consider this if Sx worse.  Symptoms  Reason For Call & Symptoms: Feeling cold and weak for the last 2 weeks.  Saw endocrinologist last week and had labs done - that office called today 2/12 and informed that she is anemic and needing iron transfusion.  Told to follow up with PMD and make arrangements for this.  Lab reports are being sent to office.  Has continued to feel cold and fired, at times can get winded if moving around.  Does not drink much fluids at any time.  Reviewed Health History In EMR: Yes  Reviewed Medications In EMR: Yes  Reviewed Allergies In EMR: Yes  Reviewed Surgeries / Procedures: Yes  Date of Onset of Symptoms: 11/04/2012  Treatments Tried: Self started Multivitamin with Iron 18mg  QD on Mon 2/10  Treatments Tried Worked: No  Guideline(s) Used:  Weakness (Generalized) and Fatigue  Disposition Per Guideline:   Go to ED Now (or to Office with PCP Approval)  Reason For Disposition Reached:   Moderate weakness from poor fluid intake with no improvement after 2 hours of rest and fluids  Advice Given:  N/A  Patient Refused Recommendation:  Patient Will Follow Up With Office Later  Declines to go to ER now

## 2012-11-23 MED ORDER — SUCRALFATE 1 GM/10ML PO SUSP: ORAL | Status: DC

## 2012-11-23 NOTE — Telephone Encounter (Signed)
Prescription printed and faxed to new mail order pharmacy.

## 2012-11-23 NOTE — Telephone Encounter (Signed)
Called left message to call back 

## 2012-11-23 NOTE — Telephone Encounter (Signed)
Please obtain records, as we dont have any to act on at this time.  OK to let pt know iron transfusion is often not necessary, as oral iron such as iron sulfate 325 mg twice per day is ok to start now since she can take pills and should be able to absorb it  (also may need to take colace 100 mg twice per day prn as well to avoid constipation though)

## 2012-11-23 NOTE — Telephone Encounter (Signed)
Patient informed of MD instructions. 

## 2012-11-23 NOTE — Telephone Encounter (Signed)
Called the patient left message to call back 

## 2012-12-01 ENCOUNTER — Telehealth: Payer: Self-pay | Admitting: Internal Medicine

## 2012-12-01 NOTE — Telephone Encounter (Signed)
Dr. Jenny Reichmann told the patient to take 2 iron pills.  The instructions on the bottle told her to take 1 pill.  Please clarify.

## 2012-12-01 NOTE — Telephone Encounter (Signed)
2 per day is ok, (3 is actually the maximum)

## 2012-12-01 NOTE — Telephone Encounter (Signed)
Called left message to call back 

## 2012-12-02 NOTE — Telephone Encounter (Signed)
Called the patient informed of MD instructions. 

## 2012-12-03 ENCOUNTER — Telehealth: Payer: Self-pay

## 2012-12-03 MED ORDER — BENZONATATE 100 MG PO CAPS: ORAL_CAPSULE | ORAL | Status: DC

## 2012-12-03 NOTE — Telephone Encounter (Signed)
Since she does not take hydrocodone, a rx for tessalon perle is sent

## 2012-12-03 NOTE — Telephone Encounter (Signed)
Patient informed. 

## 2012-12-03 NOTE — Telephone Encounter (Signed)
The patient has a cough and would like something sent in.  She has no fever, sob or wheezing.  Please send to [facility]  If ok.

## 2012-12-24 ENCOUNTER — Other Ambulatory Visit: Payer: Self-pay | Admitting: Physician Assistant

## 2012-12-29 ENCOUNTER — Telehealth: Payer: Self-pay

## 2012-12-29 MED ORDER — ESTRADIOL 1 MG PO TABS: 1.0000 mg | ORAL_TABLET | Freq: Every day | ORAL | Status: DC

## 2012-12-29 NOTE — Telephone Encounter (Signed)
Refilled estrace 

## 2013-02-07 ENCOUNTER — Other Ambulatory Visit: Payer: Self-pay | Admitting: Internal Medicine

## 2013-02-09 ENCOUNTER — Other Ambulatory Visit: Payer: Self-pay | Admitting: Physician Assistant

## 2013-03-05 ENCOUNTER — Other Ambulatory Visit: Payer: Self-pay | Admitting: Physician Assistant

## 2013-04-19 ENCOUNTER — Ambulatory Visit: Payer: Medicare Other | Admitting: Internal Medicine

## 2013-04-19 DIAGNOSIS — Z0289 Encounter for other administrative examinations: Secondary | ICD-10-CM

## 2013-04-30 ENCOUNTER — Other Ambulatory Visit: Payer: Self-pay | Admitting: Physician Assistant

## 2013-04-30 NOTE — Telephone Encounter (Signed)
OK x 2

## 2013-05-10 ENCOUNTER — Other Ambulatory Visit: Payer: Self-pay

## 2013-05-10 MED ORDER — AMLODIPINE BESYLATE 10 MG PO TABS: 10.0000 mg | ORAL_TABLET | Freq: Every day | ORAL | Status: DC

## 2013-05-16 ENCOUNTER — Other Ambulatory Visit: Payer: Self-pay | Admitting: Endocrinology

## 2013-05-31 ENCOUNTER — Telehealth: Payer: Self-pay | Admitting: Internal Medicine

## 2013-05-31 MED ORDER — METOPROLOL TARTRATE 50 MG PO TABS: ORAL_TABLET | ORAL | Status: DC

## 2013-05-31 NOTE — Telephone Encounter (Signed)
Pt request refill for metoprolol 50 mg. Please advise.

## 2013-06-15 ENCOUNTER — Other Ambulatory Visit: Payer: Self-pay | Admitting: Internal Medicine

## 2013-07-05 ENCOUNTER — Other Ambulatory Visit: Payer: Self-pay | Admitting: *Deleted

## 2013-07-05 ENCOUNTER — Other Ambulatory Visit: Payer: Self-pay | Admitting: Endocrinology

## 2013-07-05 DIAGNOSIS — E785 Hyperlipidemia, unspecified: Secondary | ICD-10-CM

## 2013-07-05 DIAGNOSIS — E890 Postprocedural hypothyroidism: Secondary | ICD-10-CM

## 2013-07-05 DIAGNOSIS — E119 Type 2 diabetes mellitus without complications: Secondary | ICD-10-CM

## 2013-07-06 ENCOUNTER — Other Ambulatory Visit (INDEPENDENT_AMBULATORY_CARE_PROVIDER_SITE_OTHER): Payer: Self-pay

## 2013-07-06 ENCOUNTER — Other Ambulatory Visit: Payer: Medicare Other

## 2013-07-06 DIAGNOSIS — E785 Hyperlipidemia, unspecified: Secondary | ICD-10-CM

## 2013-07-06 DIAGNOSIS — E119 Type 2 diabetes mellitus without complications: Secondary | ICD-10-CM

## 2013-07-06 DIAGNOSIS — E890 Postprocedural hypothyroidism: Secondary | ICD-10-CM

## 2013-07-06 LAB — LIPID PANEL
Cholesterol: 161 mg/dL (ref 0–200)
HDL: 68.8 mg/dL
LDL Cholesterol: 79 mg/dL (ref 0–99)
Total CHOL/HDL Ratio: 2
Triglycerides: 67 mg/dL (ref 0.0–149.0)
VLDL: 13.4 mg/dL (ref 0.0–40.0)

## 2013-07-06 LAB — COMPREHENSIVE METABOLIC PANEL WITH GFR
ALT: 12 U/L (ref 0–35)
AST: 16 U/L (ref 0–37)
Albumin: 3.5 g/dL (ref 3.5–5.2)
Alkaline Phosphatase: 53 U/L (ref 39–117)
BUN: 21 mg/dL (ref 6–23)
CO2: 23 meq/L (ref 19–32)
Calcium: 9.5 mg/dL (ref 8.4–10.5)
Chloride: 107 meq/L (ref 96–112)
Creatinine, Ser: 1.2 mg/dL (ref 0.4–1.2)
GFR: 59.43 mL/min — ABNORMAL LOW
Glucose, Bld: 72 mg/dL (ref 70–99)
Potassium: 4.4 meq/L (ref 3.5–5.1)
Sodium: 137 meq/L (ref 135–145)
Total Bilirubin: 0.8 mg/dL (ref 0.3–1.2)
Total Protein: 7.5 g/dL (ref 6.0–8.3)

## 2013-07-06 LAB — MICROALBUMIN / CREATININE URINE RATIO
Creatinine,U: 162.2 mg/dL
Microalb Creat Ratio: 0.9 mg/g (ref 0.0–30.0)
Microalb, Ur: 1.5 mg/dL (ref 0.0–1.9)

## 2013-07-06 LAB — URINALYSIS, ROUTINE W REFLEX MICROSCOPIC
Bilirubin Urine: NEGATIVE
Hgb urine dipstick: NEGATIVE
Ketones, ur: NEGATIVE
Leukocytes, UA: NEGATIVE
Nitrite: POSITIVE
Specific Gravity, Urine: 1.02 (ref 1.000–1.030)
Total Protein, Urine: NEGATIVE
Urine Glucose: NEGATIVE
Urobilinogen, UA: 0.2 (ref 0.0–1.0)
pH: 6 (ref 5.0–8.0)

## 2013-07-06 LAB — T4, FREE: Free T4: 0.78 ng/dL (ref 0.60–1.60)

## 2013-07-06 LAB — HEMOGLOBIN A1C: Hgb A1c MFr Bld: 5.5 % (ref 4.6–6.5)

## 2013-07-06 LAB — TSH: TSH: 0.59 u[IU]/mL (ref 0.35–5.50)

## 2013-07-09 ENCOUNTER — Ambulatory Visit (INDEPENDENT_AMBULATORY_CARE_PROVIDER_SITE_OTHER): Payer: Medicare Other | Admitting: Endocrinology

## 2013-07-09 ENCOUNTER — Encounter: Payer: Self-pay | Admitting: Endocrinology

## 2013-07-09 VITALS — BP 130/62 | HR 96 | Temp 98.5°F | Resp 14 | Ht 66.0 in | Wt 248.9 lb

## 2013-07-09 DIAGNOSIS — E890 Postprocedural hypothyroidism: Secondary | ICD-10-CM

## 2013-07-09 DIAGNOSIS — E119 Type 2 diabetes mellitus without complications: Secondary | ICD-10-CM

## 2013-07-09 DIAGNOSIS — I10 Essential (primary) hypertension: Secondary | ICD-10-CM

## 2013-07-09 NOTE — Patient Instructions (Signed)
Reduce Metformin to 1 in am  Please check blood sugars at least half the time about 2 hours after any meal and as directed on waking up. Please bring blood sugar monitor to each visit  Start VICTOZA injection with the sample pen once daily at the same time of the day.  Dial the dose to 0.6 mg for the first week.  You may  experience nausea in the first few days which usually gets better the After 1 week increase the dose to 1.2mg  daily if no nausea.  You may inject in the stomach, thigh or arm.   You will feel fullness of the stomach with starting the medication and should try to keep portions of food small.

## 2013-07-09 NOTE — Progress Notes (Signed)
[name] is an 63 y.o. female.   Reason for Appointment: Diabetes follow-up   History of Present Illness   Diagnosis: Type 2 DIABETES MELITUS, date of diagnosis: 2000  Previous history: She had initially been treated with metformin and Amaryl and subsequently given Victoza which helped overall control and weight loss. In 9/13 because of significant hyperglycemia she was started on basal bolus insulin also. Had required relatively small doses of insulin. Her blood sugar control has been excellent with A1c between 5.3-6.0 although this may be relatively higher than expected. Tends to have relatively higher readings after supper  Recent history:  She was last seen in 6/14 and was continued on her same regimen with Lantus in the morning, 12 units and NovoLog at meals 4 units a.c. Twice a day. She was also taking metformin, Amaryl in the evening and Victoza.  Because of cost she has not taken any Victoza, Lantus and NovoLog for about a month when she ran out. Surprisingly her blood sugars are not much higher even though she is starting to gain weight and eating more snacks     Oral hypoglycemic drugs: Amaryl and metformin Side effects from medications: None Insulin regimen: None currently           Proper timing of medications in relation to meals: Yes.          Monitors blood glucose: Once a day.    Glucometer:  Accu-Chek compact.          Blood Glucose readings from meter download: readings before breakfast: 105-143, 7 PM 57 and 11 PM 160. Has readings only since 9/26  Hypoglycemia frequency:  only once before supper, not sure why        Meals: 3 meals per day.          Physical activity: exercise: Limited, has had fatigue, back and leg pain.           Dietician visit: Most recent: [id number]           Complications: are: None The last HbgA1c was reported as 5.3 in 6/14  Wt Readings from Last 3 Encounters:  07/09/13 248 lb 14.4 oz (112.9 kg)  10/12/12 253 lb (114.76 kg)  08/31/12 251  lb 3.2 oz (113.944 kg)    LABS:  Appointment on 07/06/2013  Component Date Value Range Status  . Hemoglobin A1C 07/06/2013 5.5  4.6 - 6.5 % Final   Glycemic Control Guidelines for People with Diabetes:Non Diabetic:  <6%Goal of Therapy: <7%Additional Action Suggested:  >8%   . Sodium 07/06/2013 137  135 - 145 mEq/L Final  . Potassium 07/06/2013 4.4  3.5 - 5.1 mEq/L Final  . Chloride 07/06/2013 107  96 - 112 mEq/L Final  . CO2 07/06/2013 23  19 - 32 mEq/L Final  . Glucose, Bld 07/06/2013 72  70 - 99 mg/dL Final  . BUN 07/06/2013 21  6 - 23 mg/dL Final  . Creatinine, Ser 07/06/2013 1.2  0.4 - 1.2 mg/dL Final  . Total Bilirubin 07/06/2013 0.8  0.3 - 1.2 mg/dL Final  . Alkaline Phosphatase 07/06/2013 53  39 - 117 U/L Final  . AST 07/06/2013 16  0 - 37 U/L Final  . ALT 07/06/2013 12  0 - 35 U/L Final  . Total Protein 07/06/2013 7.5  6.0 - 8.3 g/dL Final  . Albumin 07/06/2013 3.5  3.5 - 5.2 g/dL Final  . Calcium 07/06/2013 9.5  8.4 - 10.5 mg/dL Final  . GFR 07/06/2013 59.43* >  60.00 mL/min Final  . Microalb, Ur 07/06/2013 1.5  0.0 - 1.9 mg/dL Final  . Creatinine,U 07/06/2013 162.2   Final  . Microalb Creat Ratio 07/06/2013 0.9  0.0 - 30.0 mg/g Final  . Cholesterol 07/06/2013 161  0 - 200 mg/dL Final   ATP III Classification       Desirable:  < 200 mg/dL               Borderline High:  200 - 239 mg/dL          High:  > = 240 mg/dL  . Triglycerides 07/06/2013 67.0  0.0 - 149.0 mg/dL Final   Normal:  <150 mg/dLBorderline High:  150 - 199 mg/dL  . HDL 07/06/2013 68.80  >39.00 mg/dL Final  . VLDL 07/06/2013 13.4  0.0 - 40.0 mg/dL Final  . LDL Cholesterol 07/06/2013 79  0 - 99 mg/dL Final  . Total CHOL/HDL Ratio 07/06/2013 2   Final                  Men          Women1/2 Average Risk     3.4          3.3Average Risk          5.0          4.42X Average Risk          9.6          7.13X Average Risk          15.0          11.0                      . Free T4 07/06/2013 0.78  0.60 - 1.60 ng/dL Final   . TSH 07/06/2013 0.59  0.35 - 5.50 uIU/mL Final  . Color, Urine 07/06/2013 LT. YELLOW  Yellow;Lt. Yellow Final  . APPearance 07/06/2013 CLEAR  Clear Final  . Specific Gravity, Urine 07/06/2013 1.020  1.000-1.030 Final  . pH 07/06/2013 6.0  5.0 - 8.0 Final  . Total Protein, Urine 07/06/2013 NEGATIVE  Negative Final  . Urine Glucose 07/06/2013 NEGATIVE  Negative Final  . Ketones, ur 07/06/2013 NEGATIVE  Negative Final  . Bilirubin Urine 07/06/2013 NEGATIVE  Negative Final  . Hgb urine dipstick 07/06/2013 NEGATIVE  Negative Final  . Urobilinogen, UA 07/06/2013 0.2  0.0 - 1.0 Final  . Leukocytes, UA 07/06/2013 NEGATIVE  Negative Final  . Nitrite 07/06/2013 POSITIVE  Negative Final  . WBC, UA 07/06/2013 3-6/hpf  0-2/hpf Final  . RBC / HPF 07/06/2013 3-6/hpf  0-2/hpf Final  . Squamous Epithelial / LPF 07/06/2013 Few(5-10/hpf)  Rare(0-4/hpf) Final  . Bacteria, UA 07/06/2013 Many(>50/hpf)  None Final      Medication List       This list is accurate as of: 07/09/13 11:47 AM.  Always use your most recent med list.               AMBULATORY NON FORMULARY MEDICATION  Domperidone 10 mg    Take one tablet by mouth twice a day before meals     amLODipine 10 MG tablet  Commonly known as:  NORVASC  Take 1 tablet (10 mg total) by mouth daily.     aspirin 81 MG EC tablet  Take 81 mg by mouth daily.     benzonatate 100 MG capsule  Commonly known as:  TESSALON PERLES  1-2 tabs by mouth every 8 hrs for  cough as needed     ciprofloxacin 500 MG tablet  Commonly known as:  CIPRO  Take 1 tablet (500 mg total) by mouth 2 (two) times daily.     estradiol 1 MG tablet  Commonly known as:  ESTRACE  Take 1 tablet (1 mg total) by mouth daily.     FLUoxetine 20 MG capsule  Commonly known as:  PROZAC  Take 1 capsule (20 mg total) by mouth daily.     furosemide 40 MG tablet  Commonly known as:  LASIX  Take 40 mg by mouth daily.     glimepiride 1 MG tablet  Commonly known as:  AMARYL  TAKE 1  TABLET WITH SUPPER ONCE A DAY BY MOUTH PER DR. Dwyane Dee     irbesartan 300 MG tablet  Commonly known as:  AVAPRO  TAKE 1 TABLET (300 MG TOTAL) BY MOUTH DAILY.     isosorbide dinitrate 10 MG tablet  Commonly known as:  ISORDIL  Take 1 tablet (10 mg total) by mouth daily.     isosorbide mononitrate 30 MG 24 hr tablet  Commonly known as:  IMDUR  Take 1 tablet (30 mg total) by mouth daily.     JANUVIA 100 MG tablet  Generic drug:  sitaGLIPtin  TAKE 1 TABLET DAILY     Liraglutide 18 MG/3ML Soln injection  Commonly known as:  VICTOZA  Inject 0.17 mLs (1.02 mg total) into the skin daily.     meclizine 12.5 MG tablet  Commonly known as:  ANTIVERT  Take 1 tablet (12.5 mg total) by mouth 3 (three) times daily as needed.     metFORMIN 500 MG 24 hr tablet  Commonly known as:  GLUCOPHAGE-XR  TAKE 1 TABLET TWICE A DAY     metoprolol 50 MG tablet  Commonly known as:  LOPRESSOR  Take one and one-half tablets twice a day     ondansetron 4 MG tablet  Commonly known as:  ZOFRAN  Take 1 tab every 6 hours as needed for nausea.     oxyCODONE 5 MG immediate release tablet  Commonly known as:  Oxy IR/ROXICODONE  1-2 tabs by mouth every 4-6 hrs for pain, with maximum 6 tabs per day     predniSONE 10 MG tablet  Commonly known as:  DELTASONE  3 tabs by mouth per day for 3 days,2tabs per day for 3 days,1tab per day for 3 days     promethazine 25 MG tablet  Commonly known as:  PHENERGAN  Take 1/2 to one tablet by mouth every 6 hours as needed for nausea and vomiting     simvastatin 20 MG tablet  Commonly known as:  ZOCOR  TAKE 1 TABLET BY MOUTH DAILY     sucralfate 1 GM/10ML suspension  Commonly known as:  CARAFATE  Take 2 teaspoons three times daily before meals.     SYNTHROID PO  Take 1 tablet by mouth daily.        Allergies:  Allergies  Allergen Reactions  . Hydrocodone     REACTION: tachycardia  . Penicillins     Hives    Past Medical History  Diagnosis Date  . COMMON  MIGRAINE   . DIVERTICULOSIS-COLON   . CORONARY ARTERY DISEASE   . Gastroparesis   . INSOMNIA-SLEEP DISORDER-UNSPEC   . Iron deficiency anemia   . Anxiety   . DEPRESSION   . Diabetes mellitus, type II   . GERD (gastroesophageal reflux disease)   . Hyperlipidemia   .  Hypertension   . Gastric ulcer 04/2008  . CERVICAL RADICULOPATHY, LEFT   . Hiatal hernia   . Hypothyroidism     Past Surgical History  Procedure Laterality Date  . Left knee replacement    . Rigth knee replacement with revision  04/2008    Dr. Berenice Primas  . Gastric wedge resection lipoma  11/2007    x2 with laparotomy and gastrostomy  . Rotator cuff repair  01/2009  . Cholecystectomy  06/2009  . S/p bladder surgury  09/2009    Dr. Terance Hart  . Abdominal hysterectomy      Family History  Problem Relation Age of Onset  . Diabetes Sister     x 3  . Heart disease Sister     x2  . Diabetes Brother     x3  . Heart disease Brother     x2  . Coronary artery disease Other     female 1st degree  . Hypertension Other   . Colon cancer Neg Hx     Social History:  reports that she has quit smoking. She has never used smokeless tobacco. She reports that she does not drink alcohol or use illicit drugs.  Review of Systems:  Hypertension: Her blood pressure is treated with metoprolol, Avapro and amlodipine. She tends to have relatively fast heart rate of unclear etiology  Lipids: Usually well controlled with simvastatin, LDL is now 3  Post ablative hypothyroidism: She has been on thyroid supplements since she had I-131 treatment for Graves' disease and TSH has been relatively stable.      Examination:   BP 130/62  Pulse 96  Temp(Src) 98.5 F (36.9 C)  Resp 14  Ht 5\' 6"  (1.676 m)  Wt 248 lb 14.4 oz (112.9 kg)  BMI 40.19 kg/m2  SpO2 97%  Body mass index is 40.19 kg/(m^2).    ASSESSMENT/ PLAN::   Diabetes type 2   Blood glucose control appears fairly good despite her not using insulin or Victoza recently She  has applied for an assistance program and should be able to start back on at least Victoza. Discussed dosage titration with  restarting Victoza  Hypothyroidism: TSH normal at 0.59  She will continue her medications for hypertension and hypothyroidism unchanged  Hanan Moen 07/09/2013, 11:47 AM

## 2013-07-31 ENCOUNTER — Other Ambulatory Visit: Payer: Self-pay | Admitting: Endocrinology

## 2013-08-03 ENCOUNTER — Ambulatory Visit (INDEPENDENT_AMBULATORY_CARE_PROVIDER_SITE_OTHER): Payer: Medicare Other

## 2013-08-03 DIAGNOSIS — Z23 Encounter for immunization: Secondary | ICD-10-CM

## 2013-08-14 ENCOUNTER — Other Ambulatory Visit: Payer: Self-pay | Admitting: Internal Medicine

## 2013-10-12 ENCOUNTER — Other Ambulatory Visit (INDEPENDENT_AMBULATORY_CARE_PROVIDER_SITE_OTHER): Payer: Medicare Other

## 2013-10-12 DIAGNOSIS — E119 Type 2 diabetes mellitus without complications: Secondary | ICD-10-CM

## 2013-10-12 DIAGNOSIS — E890 Postprocedural hypothyroidism: Secondary | ICD-10-CM

## 2013-10-12 LAB — BASIC METABOLIC PANEL
BUN: 17 mg/dL (ref 6–23)
CO2: 23 mEq/L (ref 19–32)
Calcium: 9 mg/dL (ref 8.4–10.5)
Chloride: 106 mEq/L (ref 96–112)
Creatinine, Ser: 1.3 mg/dL — ABNORMAL HIGH (ref 0.4–1.2)
GFR: 54.55 mL/min — ABNORMAL LOW (ref >60.00)
Glucose, Bld: 133 mg/dL — ABNORMAL HIGH (ref 70–99)
Potassium: 3.6 mEq/L (ref 3.5–5.1)
Sodium: 137 mEq/L (ref 135–145)

## 2013-10-12 LAB — TSH: TSH: 0.44 u[IU]/mL (ref 0.35–5.50)

## 2013-10-12 LAB — HEMOGLOBIN A1C: Hgb A1c MFr Bld: 5.4 % (ref 4.6–6.5)

## 2013-10-12 LAB — T4, FREE: Free T4: 0.94 ng/dL (ref 0.60–1.60)

## 2013-10-15 ENCOUNTER — Encounter: Payer: Self-pay | Admitting: Endocrinology

## 2013-10-15 ENCOUNTER — Ambulatory Visit (INDEPENDENT_AMBULATORY_CARE_PROVIDER_SITE_OTHER): Payer: Medicare Other | Admitting: Endocrinology

## 2013-10-15 VITALS — BP 118/62 | HR 100 | Temp 98.2°F | Resp 14 | Ht 66.0 in | Wt 249.7 lb

## 2013-10-15 DIAGNOSIS — E1165 Type 2 diabetes mellitus with hyperglycemia: Principal | ICD-10-CM

## 2013-10-15 DIAGNOSIS — E890 Postprocedural hypothyroidism: Secondary | ICD-10-CM

## 2013-10-15 DIAGNOSIS — N289 Disorder of kidney and ureter, unspecified: Secondary | ICD-10-CM

## 2013-10-15 DIAGNOSIS — I10 Essential (primary) hypertension: Secondary | ICD-10-CM

## 2013-10-15 DIAGNOSIS — IMO0001 Reserved for inherently not codable concepts without codable children: Secondary | ICD-10-CM

## 2013-10-15 DIAGNOSIS — R110 Nausea: Secondary | ICD-10-CM

## 2013-10-15 NOTE — Progress Notes (Signed)
[name] is an 63 y.o. female.   Reason for Appointment: Diabetes follow-up   History of Present Illness   Diagnosis: Type 2 DIABETES MELITUS, date of diagnosis: 2000  Previous history: She had initially been treated with metformin and Amaryl and subsequently given Victoza which helped overall control and weight loss. In 9/13 because of significant hyperglycemia she was started on basal bolus insulin also. Had required relatively small doses of insulin. Her blood sugar control has been excellent with A1c between 5.3-6.0 although this may be relatively higher than expected. Tends to have relatively higher readings after supper  Recent history:  She was last seen in 10/14 and at that time she had run out of her Lantus and Victoza because of cost. Also had been on NovoLog before meals Since her blood sugars were  Relatively these agents she was told to start back only on Victoza because of her obesity and relatively high postprandial readings She has been able to get Victoza without difficulties from the patient assistance program However she now says that she tends to get nausea with 1.2 mg which she had not experienced before She was told to stop this by gastroenterologist for possible gastroparesis However her blood sugars are significantly high when she does not take it She does well with Victoza 1.2 mg for 3-4 days and then she will get nausea. She is taking this somewhat intermittently causing variable blood sugars She also thinks that she feels more energetic when she takes the Victoza Her A1c is again low normal but historically has been falsely low     Oral hypoglycemic drugs: Amaryl  1 mg and metformin 1 g   Side effects from medications: Nausea from 1.2 Victoza       Monitors blood glucose: Once a day.    Glucometer:  Accu-Chek compact.          Blood Glucose readings from meter download:   PREMEAL Breakfast Lunch Dinner Bedtime Overall  Glucose range:  85-202   99   131    86-202    Mean/median:  123     165   132     Hypoglycemia: None, lowest reading 85      Meals: 3 meals per day.          Physical activity: exercise: Limited, has had fatigue, back and leg pain.           Dietician visit: Most recent: [id number]           Complications: are: None The last HbgA1c was reported as 5.3 in 6/14  Wt Readings from Last 3 Encounters:  10/15/13 249 lb 11.2 oz (113.263 kg)  07/09/13 248 lb 14.4 oz (112.9 kg)  10/12/12 253 lb (114.76 kg)    LABS:  Lab Results  Component Value Date   HGBA1C 5.4 10/12/2013   HGBA1C 5.5 07/06/2013   HGBA1C 6.3 10/12/2012   Lab Results  Component Value Date   MICROALBUR 1.5 07/06/2013   LDLCALC 79 07/06/2013   CREATININE 1.3* 10/12/2013    Appointment on 10/12/2013  Component Date Value Range Status  . Free T4 10/12/2013 0.94  0.60 - 1.60 ng/dL Final  . TSH 10/12/2013 0.44  0.35 - 5.50 uIU/mL Final  . Hemoglobin A1C 10/12/2013 5.4  4.6 - 6.5 % Final   Glycemic Control Guidelines for People with Diabetes:Non Diabetic:  <6%Goal of Therapy: <7%Additional Action Suggested:  >8%   . Sodium 10/12/2013 137  135 - 145 mEq/L Final  .  Potassium 10/12/2013 3.6  3.5 - 5.1 mEq/L Final  . Chloride 10/12/2013 106  96 - 112 mEq/L Final  . CO2 10/12/2013 23  19 - 32 mEq/L Final  . Glucose, Bld 10/12/2013 133* 70 - 99 mg/dL Final  . BUN 10/12/2013 17  6 - 23 mg/dL Final  . Creatinine, Ser 10/12/2013 1.3* 0.4 - 1.2 mg/dL Final  . Calcium 10/12/2013 9.0  8.4 - 10.5 mg/dL Final  . GFR 10/12/2013 54.55* >60.00 mL/min Final      Medication List       This list is accurate as of: 10/15/13 11:58 AM.  Always use your most recent med list.               AMBULATORY NON FORMULARY MEDICATION  Domperidone 10 mg    Take one tablet by mouth twice a day before meals     amitriptyline 25 MG tablet  Commonly known as:  ELAVIL     amLODipine 10 MG tablet  Commonly known as:  NORVASC  Take 1 tablet (10 mg total) by mouth daily.     aspirin 81 MG  EC tablet  Take 81 mg by mouth daily.     ELMIRON 100 MG capsule  Generic drug:  pentosan polysulfate     estradiol 1 MG tablet  Commonly known as:  ESTRACE  Take 1 tablet (1 mg total) by mouth daily.     FLUoxetine 20 MG capsule  Commonly known as:  PROZAC  Take 1 capsule (20 mg total) by mouth daily.     gabapentin 300 MG capsule  Commonly known as:  NEURONTIN     glimepiride 1 MG tablet  Commonly known as:  AMARYL  TAKE 1 TABLET WITH SUPPER ONCE A DAY BY MOUTH PER DR. Dwyane Dee     irbesartan 300 MG tablet  Commonly known as:  AVAPRO  TAKE 1 TABLET (300 MG TOTAL) BY MOUTH DAILY.     isosorbide dinitrate 10 MG tablet  Commonly known as:  ISORDIL  Take 1 tablet (10 mg total) by mouth daily.     isosorbide mononitrate 30 MG 24 hr tablet  Commonly known as:  IMDUR  Take 1 tablet (30 mg total) by mouth daily.     levothyroxine 100 MCG tablet  Commonly known as:  SYNTHROID, LEVOTHROID  TAKE 1 TABLET BY MOUTH EVERY MORNING ON AN EMPTY STOMACH ONCE A DAY     LINZESS 290 MCG Caps capsule  Generic drug:  Linaclotide     Liraglutide 18 MG/3ML Soln injection  Commonly known as:  VICTOZA  Inject 0.17 mLs (1.02 mg total) into the skin daily.     meloxicam 7.5 MG tablet  Commonly known as:  MOBIC     metFORMIN 500 MG 24 hr tablet  Commonly known as:  GLUCOPHAGE-XR  TAKE 1 TABLET TWICE A DAY     metoprolol 50 MG tablet  Commonly known as:  LOPRESSOR  Take one and one-half tablets twice a day     ondansetron 4 MG tablet  Commonly known as:  ZOFRAN  Take 1 tab every 6 hours as needed for nausea.     oxyCODONE 5 MG immediate release tablet  Commonly known as:  Oxy IR/ROXICODONE  1-2 tabs by mouth every 4-6 hrs for pain, with maximum 6 tabs per day     simvastatin 20 MG tablet  Commonly known as:  ZOCOR  TAKE 1 TABLET BY MOUTH DAILY     sucralfate 1 GM/10ML suspension  Commonly known as:  CARAFATE  Take 2 teaspoons three times daily before meals.        Allergies:   Allergies  Allergen Reactions  . Hydrocodone     REACTION: tachycardia  . Penicillins     Hives    Past Medical History  Diagnosis Date  . COMMON MIGRAINE   . DIVERTICULOSIS-COLON   . CORONARY ARTERY DISEASE   . Gastroparesis   . INSOMNIA-SLEEP DISORDER-UNSPEC   . Iron deficiency anemia   . Anxiety   . DEPRESSION   . Diabetes mellitus, type II   . GERD (gastroesophageal reflux disease)   . Hyperlipidemia   . Hypertension   . Gastric ulcer 04/2008  . CERVICAL RADICULOPATHY, LEFT   . Hiatal hernia   . Hypothyroidism     Past Surgical History  Procedure Laterality Date  . Left knee replacement    . Rigth knee replacement with revision  04/2008    Dr. Berenice Primas  . Gastric wedge resection lipoma  11/2007    x2 with laparotomy and gastrostomy  . Rotator cuff repair  01/2009  . Cholecystectomy  06/2009  . S/p bladder surgury  09/2009    Dr. Terance Hart  . Abdominal hysterectomy      Family History  Problem Relation Age of Onset  . Diabetes Sister     x 3  . Heart disease Sister     x2  . Diabetes Brother     x3  . Heart disease Brother     x2  . Coronary artery disease Other     female 1st degree  . Hypertension Other   . Colon cancer Neg Hx     Social History:  reports that she has quit smoking. She has never used smokeless tobacco. She reports that she does not drink alcohol or use illicit drugs.  Review of Systems:  Hypertension: Her blood pressure is treated with metoprolol, Avapro 300 mg and amlodipine. 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 Gastritis   Examination:   BP 118/62  Pulse 100  Temp(Src) 98.2 F (36.8 C)  Resp 14  Ht 5\' 6"  (1.676 m)  Wt 249 lb 11.2 oz (113.263 kg)  BMI 40.32 kg/m2  SpO2 99%  Body mass index is 40.32 kg/(m^2).   No pedal edema present Biceps reflexes normal  ASSESSMENT/ PLAN::   Diabetes type 2:   Blood glucose control is somewhat variable because of her using Victoza inconsistently Her A1c is falsely low since overall readings are higher than expected  Most of her high readings are after meals but is not taking these consistently Although she has been told to have gastroparesis this was not confirmed on the gastric emptying scan of 4/14. She would like to continue Victoza since it has helped her glucose control, weight management and she feels  more energetic with this.   Also her nausea is intermittent only with taking it for 3-4 days in a row  Have shown the patient how to use the equivalent of 0.9 mg on the Victoza pen and she can try this. If she has consistent nausea may need to go back to 0.6 mg daily Discussed needing to check blood sugars more frequently especially after meals  Hypothyroidism: TSH normal with current regimen. Consider reducing the dose of low on the next visit  HYPERTENSION: Blood pressure is low normal today. Also her creatinine is gradually increasing, now 1.3 Will reduce her Avapro to half a tablet for now and have her followup with PCP  Renal insufficiency: Avapro will be reduced as above, has not had microalbuminuria   Counseling time over 50% of today's 25 minute visit  Siara Gorder 10/15/2013, 11:58 AM

## 2013-10-15 NOTE — Patient Instructions (Addendum)
Victoza 5 clicks beyond 0.6 mg daily Please check blood sugars at least half the time about 2 hours after any meal and 3x weekly on waking up. Please bring blood sugar monitor to each visit  Take 1/2 of Avapro daily, extra fluids daily

## 2013-11-01 ENCOUNTER — Other Ambulatory Visit: Payer: Self-pay | Admitting: Internal Medicine

## 2013-11-09 ENCOUNTER — Telehealth: Payer: Self-pay | Admitting: *Deleted

## 2013-11-09 NOTE — Telephone Encounter (Signed)
Patients husband called about the referral to a kidney specialist that you were going to make for her, I contacted [facility] and she said no referral has been made yet?

## 2013-11-10 ENCOUNTER — Telehealth: Payer: Self-pay | Admitting: *Deleted

## 2013-11-10 NOTE — Telephone Encounter (Signed)
Labs faxed to Dr. Winn Jock office

## 2013-11-10 NOTE — Telephone Encounter (Signed)
Pt called requesting lab results be sent to Dr Awane's office (unsure of spelling). Be advised.

## 2013-11-19 ENCOUNTER — Ambulatory Visit (INDEPENDENT_AMBULATORY_CARE_PROVIDER_SITE_OTHER): Payer: Medicare Other | Admitting: Internal Medicine

## 2013-11-19 ENCOUNTER — Encounter: Payer: Self-pay | Admitting: Internal Medicine

## 2013-11-19 VITALS — BP 108/62 | HR 109 | Temp 98.3°F | Wt 251.5 lb

## 2013-11-19 DIAGNOSIS — M542 Cervicalgia: Secondary | ICD-10-CM | POA: Insufficient documentation

## 2013-11-19 DIAGNOSIS — I10 Essential (primary) hypertension: Secondary | ICD-10-CM

## 2013-11-19 DIAGNOSIS — F329 Major depressive disorder, single episode, unspecified: Secondary | ICD-10-CM

## 2013-11-19 DIAGNOSIS — F3289 Other specified depressive episodes: Secondary | ICD-10-CM

## 2013-11-19 MED ORDER — TRAMADOL HCL 50 MG PO TABS: 50.0000 mg | ORAL_TABLET | Freq: Four times a day (QID) | ORAL | Status: DC | PRN

## 2013-11-19 MED ORDER — TRAMADOL HCL 50 MG PO TABS: 50.0000 mg | ORAL_TABLET | Freq: Three times a day (TID) | ORAL | Status: DC | PRN

## 2013-11-19 MED ORDER — CYCLOBENZAPRINE HCL 5 MG PO TABS: 5.0000 mg | ORAL_TABLET | Freq: Three times a day (TID) | ORAL | Status: DC | PRN

## 2013-11-19 NOTE — Assessment & Plan Note (Signed)
Declines further tx for now, verified no SI,  to f/u any worsening symptoms or concerns
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
## 2013-11-19 NOTE — Progress Notes (Signed)
Pre-visit discussion using our clinic review tool. No additional management support is needed unless otherwise documented below in the visit note.  

## 2013-11-19 NOTE — Patient Instructions (Signed)
Please take all new medication as prescribed Please continue all other medications as before, and refills have been done if requested.  You have an appt with Dr Tamala Julian at [facility] on Monday Feb 16.  Please keep your appointments with your specialists as you have planned - endocrinology  Please return in 6 months, or sooner if needed

## 2013-11-19 NOTE — Assessment & Plan Note (Signed)
C/w chronic strain, no neuro changes, for tramadol prn, muscle relaxer, refer sports med for further eval

## 2013-11-19 NOTE — Assessment & Plan Note (Signed)
stable overall by history and exam, recent data reviewed with pt, and pt to continue medical treatment as before,  to f/u any worsening symptoms or concerns BP Readings from Last 3 Encounters:  11/19/13 108/62  10/15/13 118/62  07/09/13 130/62

## 2013-11-22 ENCOUNTER — Ambulatory Visit (INDEPENDENT_AMBULATORY_CARE_PROVIDER_SITE_OTHER): Payer: Medicare Other | Admitting: Family Medicine

## 2013-11-22 ENCOUNTER — Encounter: Payer: Self-pay | Admitting: Family Medicine

## 2013-11-22 ENCOUNTER — Ambulatory Visit (INDEPENDENT_AMBULATORY_CARE_PROVIDER_SITE_OTHER)
Admission: RE | Admit: 2013-11-22 | Discharge: 2013-11-22 | Disposition: A | Discharge status: Home / Self Care | Payer: Medicare Other | Source: Ambulatory Visit | Attending: Family Medicine | Admitting: Family Medicine

## 2013-11-22 ENCOUNTER — Ambulatory Visit: Payer: Medicare Other | Admitting: Family Medicine

## 2013-11-22 VITALS — BP 136/70 | HR 97 | Temp 97.5°F | Resp 18 | Wt 254.1 lb

## 2013-11-22 DIAGNOSIS — M542 Cervicalgia: Secondary | ICD-10-CM

## 2013-11-22 DIAGNOSIS — M5412 Radiculopathy, cervical region: Secondary | ICD-10-CM

## 2013-11-22 MED ORDER — PREDNISONE 50 MG PO TABS: 50.0000 mg | ORAL_TABLET | Freq: Every day | ORAL | Status: DC
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
## 2013-11-22 NOTE — Progress Notes (Signed)
Pre-visit discussion using our clinic review tool. No additional management support is needed unless otherwise documented below in the visit note.  

## 2013-11-22 NOTE — Patient Instructions (Addendum)
Very nice to meet you Try exercises 3 times a week.  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  Prednisone daily for 5 days.  Take tylenol 650 mg three times a day is the best evidence based medicine we have for arthritis.  Glucosamine sulfate 750mg  twice a day is a supplement that has been shown to help moderate to severe arthritis. Vitamin D 2000 IU daily Fish oil 2 grams daily.  Tumeric 500mg  twice daily.  Capsaicin topically up to four times a day may also help with pain. Cortisone injections are an option if these interventions do not seem to make a difference or need more relief.  Physical therapy will be calling you.  Come back and see me in 10 days to make sure you are responding to the treatment.

## 2013-11-22 NOTE — Assessment & Plan Note (Signed)
Patient has not responded well to any of the medications that we've given her so far. We are going to try a dose of prednisone to see if patient gets any possible improvement with significant anti-inflammatories. Patient given home exercise program that she will do on a near daily basis. We discussed icing protocol as well they can be beneficial. Patient will also be sent to formal physical therapy and we'll get x-rays. Patient will come back in 3-4 weeks for further evaluation. His lungs patient is tunnel that we'll consider further imaging if she does not make any improvement.

## 2013-11-29 ENCOUNTER — Other Ambulatory Visit: Payer: Self-pay | Admitting: Internal Medicine

## 2013-12-02 ENCOUNTER — Ambulatory Visit: Payer: Medicare Other | Admitting: Family Medicine

## 2013-12-07 ENCOUNTER — Ambulatory Visit: Payer: Medicare Other | Admitting: Family Medicine

## 2013-12-10 ENCOUNTER — Other Ambulatory Visit: Payer: Self-pay | Admitting: *Deleted

## 2013-12-10 MED ORDER — GLUCOSE BLOOD VI STRP: ORAL_STRIP | Status: DC

## 2013-12-20 ENCOUNTER — Encounter: Payer: Self-pay | Admitting: Family Medicine

## 2013-12-20 ENCOUNTER — Ambulatory Visit (INDEPENDENT_AMBULATORY_CARE_PROVIDER_SITE_OTHER): Payer: Medicare Other | Admitting: Family Medicine

## 2013-12-20 VITALS — BP 128/80 | HR 74

## 2013-12-20 DIAGNOSIS — M5412 Radiculopathy, cervical region: Secondary | ICD-10-CM

## 2013-12-20 MED ORDER — CYCLOBENZAPRINE HCL 5 MG PO TABS: 5.0000 mg | ORAL_TABLET | Freq: Three times a day (TID) | ORAL | Status: DC | PRN
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
## 2013-12-20 NOTE — Patient Instructions (Signed)
Good to see you I am glad you are a little better Continue the exercises 3 times a week.  Heat before exercise and ice afterward.  If you have trouble doing exercises on your own I will send you to physical therapy  Vitamin D 2000 IU daily.  Turmeric 500mg  twice daily.  Continue flexeril as needed Continue the gabapentin.  Come back again in 6 weeks.

## 2013-12-20 NOTE — Assessment & Plan Note (Signed)
Spent greater than 25 minutes with patient face-to-face and had greater than 50% of counseling including as described above in assessment and plan.  Discussed with patient at great length. Patient is having radiculopathy going on the left side and I think she likely has some mild nerve root impingement that does correspond well with x-ray imaging of C7 nerve root compression. Patient does admission would not want surgical intervention and has had epidural injections in her back which she would like to avoid. I would like patient to try some over-the-counter medications into the homework that I have given her more religiously for her rehabilitation. Recommended formal physical therapy can which patient declined. We discussed heat before exercise and ice afterwards. Patient will try these interventions and come back again in 6 weeks.

## 2013-12-24 ENCOUNTER — Other Ambulatory Visit: Payer: Self-pay | Admitting: *Deleted

## 2013-12-24 MED ORDER — GLUCOSE BLOOD VI STRP: ORAL_STRIP | Status: DC

## 2014-01-03 ENCOUNTER — Encounter: Payer: Self-pay | Admitting: *Deleted

## 2014-01-05 ENCOUNTER — Other Ambulatory Visit: Payer: Self-pay | Admitting: *Deleted

## 2014-01-05 MED ORDER — ACCU-CHEK COMPACT PLUS CARE KIT: PACK | Status: DC

## 2014-01-07 ENCOUNTER — Other Ambulatory Visit: Payer: Medicare Other

## 2014-01-09 ENCOUNTER — Other Ambulatory Visit: Payer: Self-pay | Admitting: Internal Medicine

## 2014-01-09 ENCOUNTER — Other Ambulatory Visit: Payer: Self-pay | Admitting: Endocrinology

## 2014-01-10 ENCOUNTER — Other Ambulatory Visit: Payer: Medicare Other

## 2014-01-13 ENCOUNTER — Ambulatory Visit: Payer: Medicare Other | Admitting: Endocrinology

## 2014-01-14 ENCOUNTER — Other Ambulatory Visit: Payer: Self-pay | Admitting: Internal Medicine

## 2014-01-19 ENCOUNTER — Other Ambulatory Visit: Payer: Self-pay | Admitting: *Deleted

## 2014-01-19 MED ORDER — METFORMIN HCL ER 500 MG PO TB24: ORAL_TABLET | ORAL | Status: DC

## 2014-01-31 ENCOUNTER — Encounter: Payer: Self-pay | Admitting: Family Medicine

## 2014-01-31 ENCOUNTER — Ambulatory Visit (INDEPENDENT_AMBULATORY_CARE_PROVIDER_SITE_OTHER): Payer: Medicare Other | Admitting: Family Medicine

## 2014-01-31 VITALS — BP 142/80 | HR 76 | Wt 263.0 lb

## 2014-01-31 DIAGNOSIS — M7710 Lateral epicondylitis, unspecified elbow: Secondary | ICD-10-CM

## 2014-01-31 DIAGNOSIS — M7711 Lateral epicondylitis, right elbow: Secondary | ICD-10-CM

## 2014-01-31 DIAGNOSIS — M5412 Radiculopathy, cervical region: Secondary | ICD-10-CM

## 2014-01-31 MED ORDER — DICLOFENAC SODIUM 2 % TD SOLN: 2.0000 "application " | Freq: Two times a day (BID) | TRANSDERMAL | Status: DC

## 2014-01-31 NOTE — Patient Instructions (Signed)
Good to see you Continue the neck exercises.  exercises for your elbow 3 times a week.  Wear brace day and night for 2 weeks then nightly for another 2 weeks.  Ice 20 minutes after activity and after exercises Pennsaid twice daily.  See me again in 4 weeks.
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
## 2014-01-31 NOTE — Assessment & Plan Note (Addendum)
Lateral Epicondylitis: Elbow anatomy was reviewed, and tendinopathy was explained.  Pt. given a formal rehab program. Series of concentric and eccentric exercises should be done starting with no weight, work up to 1 lb, hammer, etc.  Use of wrist brace was fitted by me today.  Formal PT would be beneficial. Emphasized stretching an cross-friction massage Emphasized proper palms up lifting biomechanics to unload ECRB Spent greater than 25 minutes with patient face-to-face and had greater than 50% of counseling including as described above in assessment and plan.

## 2014-01-31 NOTE — Assessment & Plan Note (Signed)
Patient does have cervical radiculopathy and is on significant amount medications already. We discussed over-the-counter medicines that could be helpful and encourage her to continue the exercises area patient once again was encouraged to go to formal physical therapy which she declined. Patient has had epidural steroid injections in her back and does not want to do any for her neck. Patient is making some improvement based on physical exam today so to continue with the conservative management. The patient continues to improve we will continue to act conservative. Patient will follow up again in 3-4 weeks to

## 2014-02-02 ENCOUNTER — Other Ambulatory Visit: Payer: Self-pay | Admitting: Endocrinology

## 2014-02-21 ENCOUNTER — Ambulatory Visit: Payer: Medicare Other | Admitting: Family Medicine

## 2014-02-22 ENCOUNTER — Encounter: Payer: Self-pay | Admitting: Family Medicine

## 2014-02-22 ENCOUNTER — Ambulatory Visit (INDEPENDENT_AMBULATORY_CARE_PROVIDER_SITE_OTHER): Payer: Medicare Other | Admitting: Family Medicine

## 2014-02-22 VITALS — BP 122/72 | HR 68 | Ht 66.0 in | Wt 261.0 lb

## 2014-02-22 DIAGNOSIS — M7710 Lateral epicondylitis, unspecified elbow: Secondary | ICD-10-CM

## 2014-02-22 DIAGNOSIS — M7711 Lateral epicondylitis, right elbow: Secondary | ICD-10-CM

## 2014-02-22 NOTE — Patient Instructions (Signed)
Good to see you as always.  Physical therapy 1 time Continue the pennsaid twice daily Icing 2 times daily will help a lot.  Try to start the exercises as well.  Try the new brace as well.  Come back in 3 weeks. If still in pain we will do injection.

## 2014-02-22 NOTE — Progress Notes (Signed)
  [name] [address] [place], [place] [address] Phone: [phone] Subjective:    CC:  Right elbow pain follow up.   [id number] [name] is a 63 y.o. female coming in for follow up of right elbow pain. Patient does have a past medical history significant for cervical radiculopathy. Patient was diagnosed with lateral epicondylitis of the right shoulder. Patient was given home exercises, and topical anti-inflammatories, icing protocol as well as a wrist brace. Patient is not do any thing that was recommended she states. Patient is now wearing the wrist brace to that is uncomfortable, does not like icing, states that the topical medication does seem to help but does not when using a regular basis and has lost the home exercises. Patient states that she continues to have the same problem and states that it is gotten somewhat worse. More of a dull aching sensation at all time. Patient states it can be bad enough that it stops her from doing regular activities. Denies any nighttime awakening.    Past medical history, social, surgical and family history all reviewed in electronic medical record.   Review of Systems: No headache, visual changes, nausea, vomiting, diarrhea, constipation, dizziness, abdominal pain, skin rash, fevers, chills, night sweats, weight loss, swollen lymph nodes, body aches, joint swelling, muscle aches, chest pain, shortness of breath, mood changes.   Objective Blood pressure 122/72, pulse 68, height 5\' 6"  (1.676 m), weight 261 lb (118.389 kg), SpO2 98.00%.  General: No apparent distress alert and oriented x3 mood and affect normal, dressed appropriately.  HEENT: Pupils equal, extraocular movements intact  Respiratory: Patient's speak in full sentences and does not appear short of breath  Cardiovascular: No lower extremity edema, non tender, no erythema  Skin: Warm dry intact with no signs of infection or rash on extremities or on  axial skeleton.  Abdomen: Soft nontender  Neuro: Cranial nerves II through XII are intact, neurovascularly intact in all extremities with 2+ DTRs and 2+ pulses.  Lymph: No lymphadenopathy of posterior or anterior cervical chain or axillae bilaterally.  Gait normal with good balance and coordination.  MSK:  Non tender with full range of motion and good stability and symmetric strength and tone of   wrist, hip, knee and ankles bilaterally.    Elbow: Right Unremarkable to inspection. Range of motion full pronation, supination, flexion, extension. Strength is full to all of the above directions but does cause pain with testing mostly with wrist extension. Stable to varus, valgus stress. Negative moving valgus stress test. Tender to palpation over the lateral epicondylar region as well as pain with resisted extension of the middle finger. Ulnar nerve does not sublux. Negative cubital tunnel Tinel's. Contralateral elbow unremarkable    Impression and Recommendations:     This case required medical decision making of moderate complexity.

## 2014-02-22 NOTE — Assessment & Plan Note (Signed)
Patient had difficulty doing home exercises on her own and I think formal physical therapy will be helpful. order was put in. We discussed icing protocol I will be beneficial as well. Patient was given a counterforce bracing which showed proper adjustments and wearing properly. Patient was given home exercises again. Patient will try these interventions and come back in 3 weeks. Patient is not doing much better she is still conservative therapy we may need to consider injection. If patient does not get improvement with injection and we need to consider radicular symptoms.  Spent greater than 25 minutes with patient face-to-face and had greater than 50% of counseling including as described above in assessment and plan.

## 2014-02-23 ENCOUNTER — Encounter: Payer: Self-pay | Admitting: Internal Medicine

## 2014-02-23 ENCOUNTER — Other Ambulatory Visit (INDEPENDENT_AMBULATORY_CARE_PROVIDER_SITE_OTHER): Payer: Medicare Other

## 2014-02-23 ENCOUNTER — Ambulatory Visit (INDEPENDENT_AMBULATORY_CARE_PROVIDER_SITE_OTHER): Payer: Medicare Other | Admitting: Internal Medicine

## 2014-02-23 VITALS — BP 110/64 | HR 77 | Temp 97.2°F | Wt 261.2 lb

## 2014-02-23 DIAGNOSIS — E1165 Type 2 diabetes mellitus with hyperglycemia: Secondary | ICD-10-CM

## 2014-02-23 DIAGNOSIS — Z0000 Encounter for general adult medical examination without abnormal findings: Secondary | ICD-10-CM

## 2014-02-23 DIAGNOSIS — R609 Edema, unspecified: Secondary | ICD-10-CM

## 2014-02-23 DIAGNOSIS — IMO0001 Reserved for inherently not codable concepts without codable children: Secondary | ICD-10-CM

## 2014-02-23 DIAGNOSIS — E119 Type 2 diabetes mellitus without complications: Secondary | ICD-10-CM

## 2014-02-23 LAB — URINALYSIS, ROUTINE W REFLEX MICROSCOPIC
Bilirubin Urine: NEGATIVE
Hgb urine dipstick: NEGATIVE
Ketones, ur: NEGATIVE
Nitrite: POSITIVE — AB
Specific Gravity, Urine: 1.025 (ref 1.000–1.030)
Urine Glucose: NEGATIVE
Urobilinogen, UA: 0.2 (ref 0.0–1.0)
pH: 6 (ref 5.0–8.0)

## 2014-02-23 LAB — CBC WITH DIFFERENTIAL/PLATELET
Basophils Absolute: 0 10*3/uL (ref 0.0–0.1)
Basophils Relative: 0.7 % (ref 0.0–3.0)
Eosinophils Absolute: 0.3 10*3/uL (ref 0.0–0.7)
Eosinophils Relative: 5.3 % — ABNORMAL HIGH (ref 0.0–5.0)
HCT: 31.1 % — ABNORMAL LOW (ref 36.0–46.0)
Hemoglobin: 10.2 g/dL — ABNORMAL LOW (ref 12.0–15.0)
Lymphocytes Relative: 28.6 % (ref 12.0–46.0)
Lymphs Abs: 1.8 10*3/uL (ref 0.7–4.0)
MCHC: 32.7 g/dL (ref 30.0–36.0)
MCV: 96.8 fl (ref 78.0–100.0)
Monocytes Absolute: 0.3 10*3/uL (ref 0.1–1.0)
Monocytes Relative: 4.7 % (ref 3.0–12.0)
Neutro Abs: 3.8 10*3/uL (ref 1.4–7.7)
Neutrophils Relative %: 60.7 % (ref 43.0–77.0)
Platelets: 237 10*3/uL (ref 150.0–400.0)
RBC: 3.21 Mil/uL — ABNORMAL LOW (ref 3.87–5.11)
RDW: 13.7 % (ref 11.5–15.5)
WBC: 6.3 10*3/uL (ref 4.0–10.5)

## 2014-02-23 LAB — HEPATIC FUNCTION PANEL
ALT: 15 U/L (ref 0–35)
AST: 13 U/L (ref 0–37)
Albumin: 3.4 g/dL — ABNORMAL LOW (ref 3.5–5.2)
Alkaline Phosphatase: 51 U/L (ref 39–117)
Bilirubin, Direct: 0.1 mg/dL (ref 0.0–0.3)
Total Bilirubin: 0.5 mg/dL (ref 0.2–1.2)
Total Protein: 6.7 g/dL (ref 6.0–8.3)

## 2014-02-23 LAB — LIPID PANEL
Cholesterol: 152 mg/dL (ref 0–200)
HDL: 56.5 mg/dL (ref >39.00)
LDL Cholesterol: 75 mg/dL (ref 0–99)
Total CHOL/HDL Ratio: 3
Triglycerides: 105 mg/dL (ref 0.0–149.0)
VLDL: 21 mg/dL (ref 0.0–40.0)

## 2014-02-23 LAB — BASIC METABOLIC PANEL
BUN: 16 mg/dL (ref 6–23)
CO2: 25 mEq/L (ref 19–32)
Calcium: 9.3 mg/dL (ref 8.4–10.5)
Chloride: 110 mEq/L (ref 96–112)
Creatinine, Ser: 1.3 mg/dL — ABNORMAL HIGH (ref 0.4–1.2)
GFR: 52.11 mL/min — ABNORMAL LOW (ref >60.00)
Glucose, Bld: 78 mg/dL (ref 70–99)
Potassium: 4.6 mEq/L (ref 3.5–5.1)
Sodium: 141 mEq/L (ref 135–145)

## 2014-02-23 LAB — MICROALBUMIN / CREATININE URINE RATIO
Creatinine,U: 263.1 mg/dL
Microalb Creat Ratio: 0.2 mg/g (ref 0.0–30.0)
Microalb, Ur: 0.4 mg/dL (ref 0.0–1.9)

## 2014-02-23 LAB — HEMOGLOBIN A1C: Hgb A1c MFr Bld: 5.9 % (ref 4.6–6.5)

## 2014-02-23 LAB — TSH: TSH: 0.09 u[IU]/mL — ABNORMAL LOW (ref 0.35–4.50)

## 2014-02-23 MED ORDER — FUROSEMIDE 40 MG PO TABS: 40.0000 mg | ORAL_TABLET | Freq: Two times a day (BID) | ORAL | Status: DC

## 2014-02-23 MED ORDER — AMLODIPINE BESYLATE 5 MG PO TABS: 5.0000 mg | ORAL_TABLET | Freq: Every day | ORAL | Status: DC

## 2014-02-23 NOTE — Progress Notes (Signed)
Pre visit review using our clinic review tool, if applicable. No additional management support is needed unless otherwise documented below in the visit note. 

## 2014-02-23 NOTE — Patient Instructions (Signed)
Please take all new medication as prescribed - the fluid pill twice per day  OK to decrease the amlodipine to 5 mg per day  Please continue all other medications as before, and refills have been done if requested. Please have the pharmacy call with any other refills you may need.  Please continue your efforts at being more active, low cholesterol diet, and weight control.  You are otherwise up to date with prevention measures today.  Please keep your appointments with your specialists as you have planned  Please go to the LAB in the Basement (turn left off the elevator) for the tests to be done today  You will be contacted by phone if any changes need to be made immediately.  Otherwise, you will receive a letter about your results with an explanation, but please check with MyChart first.  Please return in 1 wk, or sooner if needed

## 2014-02-24 ENCOUNTER — Ambulatory Visit: Payer: Medicare Other

## 2014-02-24 ENCOUNTER — Encounter: Payer: Self-pay | Admitting: Internal Medicine

## 2014-02-24 DIAGNOSIS — E038 Other specified hypothyroidism: Secondary | ICD-10-CM

## 2014-02-24 LAB — T4, FREE: Free T4: 0.96 ng/dL (ref 0.60–1.60)

## 2014-02-28 NOTE — Assessment & Plan Note (Signed)
stable overall by history and exam, recent data reviewed with pt, and pt to continue medical treatment as before,  to f/u any worsening symptoms or concerns Lab Results  Component Value Date   HGBA1C 5.9 02/23/2014

## 2014-02-28 NOTE — Progress Notes (Signed)
Subjective:    Patient ID: [name], female    DOB: 11-03-1950, 63 y.o.   MRN: [id number]  HPI  Here for wellness and f/u;  Overall doing ok;  Pt denies CP, worsening SOB, DOE, wheezing, orthopnea, PND, worsening LE edema, palpitations, dizziness or syncope.  Pt denies neurological change such as new headache, facial or extremity weakness.  Pt denies polydipsia, polyuria, or low sugar symptoms. Pt states overall good compliance with treatment and medications, good tolerability, and has been trying to follow lower cholesterol diet.  Pt denies worsening depressive symptoms, suicidal ideation or panic. No fever, night sweats, wt loss, loss of appetite, or other constitutional symptoms.  Pt states good ability with ADL's, has low fall risk, home safety reviewed and adequate, no other significant changes in hearing or vision, and only occasionally active with exercise.  Also with worsening LE edema that seems to persist more at night than before.  Past Medical History  Diagnosis Date  . COMMON MIGRAINE   . DIVERTICULOSIS-COLON   . CORONARY ARTERY DISEASE   . Gastroparesis   . INSOMNIA-SLEEP DISORDER-UNSPEC   . Iron deficiency anemia   . Anxiety   . DEPRESSION   . Diabetes mellitus, type II   . GERD (gastroesophageal reflux disease)   . Hyperlipidemia   . Hypertension   . Gastric ulcer 04/2008  . CERVICAL RADICULOPATHY, LEFT   . Hiatal hernia   . Hypothyroidism    Past Surgical History  Procedure Laterality Date  . Left knee replacement    . Rigth knee replacement with revision  04/2008    Dr. Berenice Primas  . Gastric wedge resection lipoma  11/2007    x2 with laparotomy and gastrostomy  . Rotator cuff repair  01/2009  . Cholecystectomy  06/2009  . S/p bladder surgury  09/2009    Dr. Terance Hart  . Abdominal hysterectomy      reports that she has quit smoking. She has never used smokeless tobacco. She reports that she does not drink alcohol or use illicit drugs. family history includes  Coronary artery disease in her other; Diabetes in her brother and sister; Heart disease in her brother and sister; Hypertension in her other. There is no history of Colon cancer. Allergies  Allergen Reactions  . Hydrocodone     REACTION: tachycardia  . Penicillins     Hives   Current Outpatient Prescriptions on File Prior to Visit  Medication Sig Dispense Refill  . AMBULATORY NON FORMULARY MEDICATION Domperidone 10 mg    Take one tablet by mouth twice a day before meals  120 tablet  3  . amitriptyline (ELAVIL) 25 MG tablet       . aspirin 81 MG EC tablet Take 81 mg by mouth daily.        . Blood Glucose Monitoring Suppl (ACCU-CHEK COMPACT CARE KIT) KIT Use as directed to check blood sugar 2 times per day dx code 250.00  1 each  0  . cyclobenzaprine (FLEXERIL) 5 MG tablet Take 1 tablet (5 mg total) by mouth 3 (three) times daily as needed for muscle spasms.  180 tablet  1  . Diclofenac Sodium (PENNSAID) 2 % SOLN Place 2 application onto the skin 2 (two) times daily.  112 g  3  . ELMIRON 100 MG capsule       . estradiol (ESTRACE) 1 MG tablet TAKE 1 TABLET (1 MG TOTAL) BY MOUTH DAILY.  90 tablet  2  . FLUoxetine (PROZAC) 20 MG capsule  TAKE 1 CAPSULE (20 MG TOTAL) BY MOUTH DAILY.  90 capsule  1  . gabapentin (NEURONTIN) 300 MG capsule       . glimepiride (AMARYL) 1 MG tablet TAKE 1 TABLET WITH SUPPER ONCE A DAY BY MOUTH PER DR. Dwyane Dee  30 tablet  4  . glucose blood (ACCU-CHEK COMPACT PLUS) test strip Use as instructed to check blood sugars once a day dx code 250.00  100 each  5  . irbesartan (AVAPRO) 300 MG tablet TAKE 1 TABLET (300 MG TOTAL) BY MOUTH DAILY.  30 tablet  6  . isosorbide dinitrate (ISORDIL) 10 MG tablet TAKE 1 TABLET (10 MG TOTAL) BY MOUTH DAILY.  90 tablet  3  . levothyroxine (SYNTHROID, LEVOTHROID) 100 MCG tablet TAKE 1 TABLET BY MOUTH EVERY MORNING ON AN EMPTY STOMACH ONCE A DAY  30 tablet  5  . LINZESS 290 MCG CAPS capsule       . Liraglutide (VICTOZA) 18 MG/3ML SOLN Inject  0.17 mLs (1.02 mg total) into the skin daily.  6 mL  1  . meloxicam (MOBIC) 7.5 MG tablet       . metFORMIN (GLUCOPHAGE-XR) 500 MG 24 hr tablet TAKE 1 TABLET TWICE A DAY  180 tablet  0  . metoprolol (LOPRESSOR) 50 MG tablet Take one and one-half tablets twice a day  90 tablet  11  . ondansetron (ZOFRAN) 4 MG tablet Take 1 tab every 6 hours as needed for nausea.  30 tablet  2  . oxyCODONE (OXY IR/ROXICODONE) 5 MG immediate release tablet 1-2 tabs by mouth every 4-6 hrs for pain, with maximum 6 tabs per day  100 tablet  0  . predniSONE (DELTASONE) 50 MG tablet Take 1 tablet (50 mg total) by mouth daily.  5 tablet  0  . simvastatin (ZOCOR) 20 MG tablet TAKE 1 TABLET BY MOUTH DAILY  90 tablet  1  . sucralfate (CARAFATE) 1 GM/10ML suspension Take 2 teaspoons three times daily before meals.  5400 mL  1  . traMADol (ULTRAM) 50 MG tablet Take 1 tablet (50 mg total) by mouth every 6 (six) hours as needed.  50 tablet  0  . isosorbide mononitrate (IMDUR) 30 MG 24 hr tablet Take 1 tablet (30 mg total) by mouth daily.  30 tablet  11   No current facility-administered medications on file prior to visit.   Review of Systems Constitutional: Negative for increased diaphoresis, other activity, appetite or other siginficant weight change  HENT: Negative for worsening hearing loss, ear pain, facial swelling, mouth sores and neck stiffness.   Eyes: Negative for other worsening pain, redness or visual disturbance.  Respiratory: Negative for shortness of breath and wheezing.   Cardiovascular: Negative for chest pain and palpitations.  Gastrointestinal: Negative for diarrhea, blood in stool, abdominal distention or other pain Genitourinary: Negative for hematuria, flank pain or change in urine volume.  Musculoskeletal: Negative for myalgias or other joint complaints.  Skin: Negative for color change and wound.  Neurological: Negative for syncope and numbness. other than noted Hematological: Negative for adenopathy.  or other swelling Psychiatric/Behavioral: Negative for hallucinations, self-injury, decreased concentration or other worsening agitation.      Objective:   Physical Exam BP 110/64  Pulse 77  Temp(Src) 97.2 F (36.2 C) (Oral)  Wt 261 lb 4 oz (118.502 kg)  SpO2 99% VS noted,  Constitutional: Pt is oriented to person, place, and time. Appears well-developed and well-nourished.  Head: Normocephalic and atraumatic.  Right Ear: External ear  normal.  Left Ear: External ear normal.  Nose: Nose normal.  Mouth/Throat: Oropharynx is clear and moist.  Eyes: Conjunctivae and EOM are normal. Pupils are equal, round, and reactive to light.  Neck: Normal range of motion. Neck supple. No JVD present. No tracheal deviation present.  Cardiovascular: Normal rate, regular rhythm, normal heart sounds and intact distal pulses.   Pulmonary/Chest: Effort normal and breath sounds without rales or wheezing  Abdominal: Soft. Bowel sounds are normal. NT. No HSM  Musculoskeletal: Normal range of motion. Exhibits 1-2+ edema to above knees bilat.  Lymphadenopathy:  Has no cervical adenopathy.  Neurological: Pt is alert and oriented to person, place, and time. Pt has normal reflexes. No cranial nerve deficit. Motor grossly intact Skin: Skin is warm and dry. No rash noted.  Psychiatric:  Has normal mood and affect. Behavior is normal.      Assessment & Plan:

## 2014-02-28 NOTE — Assessment & Plan Note (Signed)

## 2014-02-28 NOTE — Assessment & Plan Note (Signed)
[place] for increased diuretic to bid, reduce amlod from 10 to 5 qd, f/u 1 wk

## 2014-03-03 ENCOUNTER — Ambulatory Visit (INDEPENDENT_AMBULATORY_CARE_PROVIDER_SITE_OTHER): Payer: Medicare Other | Admitting: Internal Medicine

## 2014-03-03 ENCOUNTER — Encounter: Payer: Self-pay | Admitting: Internal Medicine

## 2014-03-03 VITALS — BP 142/82 | HR 85 | Temp 98.2°F | Wt 262.1 lb

## 2014-03-03 DIAGNOSIS — L600 Ingrowing nail: Secondary | ICD-10-CM

## 2014-03-03 DIAGNOSIS — M545 Low back pain, unspecified: Secondary | ICD-10-CM

## 2014-03-03 DIAGNOSIS — N183 Chronic kidney disease, stage 3 unspecified: Secondary | ICD-10-CM | POA: Insufficient documentation

## 2014-03-03 DIAGNOSIS — N184 Chronic kidney disease, stage 4 (severe): Secondary | ICD-10-CM | POA: Insufficient documentation

## 2014-03-03 DIAGNOSIS — I10 Essential (primary) hypertension: Secondary | ICD-10-CM

## 2014-03-03 DIAGNOSIS — N189 Chronic kidney disease, unspecified: Secondary | ICD-10-CM

## 2014-03-03 MED ORDER — TRAMADOL HCL 50 MG PO TABS: 50.0000 mg | ORAL_TABLET | Freq: Four times a day (QID) | ORAL | Status: DC | PRN

## 2014-03-03 MED ORDER — FUROSEMIDE 40 MG PO TABS: ORAL_TABLET | ORAL | Status: DC

## 2014-03-03 NOTE — Assessment & Plan Note (Addendum)
With gradual worsening last 3 yrs, to d/c nsaid, incr lasix 60 bid, compression stockings for edema, refer renal, f/u bmet 1 wk  Note:  Total time for pt hx, exam, review of record with pt in the room, determination of diagnoses and plan for further eval and tx is > 40 min, with over 50% spent in coordination and counseling of patient

## 2014-03-03 NOTE — Assessment & Plan Note (Signed)
Increased now s/p lower amldo 10 to 5 mg; follow for now on increased diuretic BP Readings from Last 3 Encounters:  03/03/14 142/82  02/23/14 110/64  02/22/14 122/72

## 2014-03-03 NOTE — Progress Notes (Signed)
Subjective:    Patient ID: [name], female    DOB: May 14, 1951, 63 y.o.   MRN: [id number]  HPI  Here to f/u, with good compliance with meds including the lasix 40 bid, and reduced amlod to 5 mg;  Has somewhat less edema to legs, but no wt change overall, despite low salt and leg elevation as well.  mreloxicam incr to 15 mg yestserday per ortho for chronic lbp- Pt continues to have recurring LBP without change in severity, bowel or bladder change, fever, wt loss,  worsening LE pain/numbness/weakness, gait change or falls. Pt denies chest pain, increased sob or doe, wheezing, orthopnea, PND, increased LE swelling, palpitations, dizziness or syncope. Past Medical History  Diagnosis Date  . COMMON MIGRAINE   . DIVERTICULOSIS-COLON   . CORONARY ARTERY DISEASE   . Gastroparesis   . INSOMNIA-SLEEP DISORDER-UNSPEC   . Iron deficiency anemia   . Anxiety   . DEPRESSION   . Diabetes mellitus, type II   . GERD (gastroesophageal reflux disease)   . Hyperlipidemia   . Hypertension   . Gastric ulcer 04/2008  . CERVICAL RADICULOPATHY, LEFT   . Hiatal hernia   . Hypothyroidism    Past Surgical History  Procedure Laterality Date  . Left knee replacement    . Rigth knee replacement with revision  04/2008    Dr. Berenice Primas  . Gastric wedge resection lipoma  11/2007    x2 with laparotomy and gastrostomy  . Rotator cuff repair  01/2009  . Cholecystectomy  06/2009  . S/p bladder surgury  09/2009    Dr. Terance Hart  . Abdominal hysterectomy      reports that she has quit smoking. She has never used smokeless tobacco. She reports that she does not drink alcohol or use illicit drugs. family history includes Coronary artery disease in her other; Diabetes in her brother and sister; Heart disease in her brother and sister; Hypertension in her other. There is no history of Colon cancer. Allergies  Allergen Reactions  . Hydrocodone     REACTION: tachycardia  . Penicillins     Hives   Current Outpatient  Prescriptions on File Prior to Visit  Medication Sig Dispense Refill  . AMBULATORY NON FORMULARY MEDICATION Domperidone 10 mg    Take one tablet by mouth twice a day before meals  120 tablet  3  . amitriptyline (ELAVIL) 25 MG tablet       . amLODipine (NORVASC) 5 MG tablet Take 1 tablet (5 mg total) by mouth daily.  90 tablet  3  . aspirin 81 MG EC tablet Take 81 mg by mouth daily.        . Blood Glucose Monitoring Suppl (ACCU-CHEK COMPACT CARE KIT) KIT Use as directed to check blood sugar 2 times per day dx code 250.00  1 each  0  . cyclobenzaprine (FLEXERIL) 5 MG tablet Take 1 tablet (5 mg total) by mouth 3 (three) times daily as needed for muscle spasms.  180 tablet  1  . Diclofenac Sodium (PENNSAID) 2 % SOLN Place 2 application onto the skin 2 (two) times daily.  112 g  3  . ELMIRON 100 MG capsule       . estradiol (ESTRACE) 1 MG tablet TAKE 1 TABLET (1 MG TOTAL) BY MOUTH DAILY.  90 tablet  2  . FLUoxetine (PROZAC) 20 MG capsule TAKE 1 CAPSULE (20 MG TOTAL) BY MOUTH DAILY.  90 capsule  1  . furosemide (LASIX) 40 MG tablet Take  1 tablet (40 mg total) by mouth 2 (two) times daily.  60 tablet  3  . gabapentin (NEURONTIN) 300 MG capsule       . glimepiride (AMARYL) 1 MG tablet TAKE 1 TABLET WITH SUPPER ONCE A DAY BY MOUTH PER DR. Dwyane Dee  30 tablet  4  . glucose blood (ACCU-CHEK COMPACT PLUS) test strip Use as instructed to check blood sugars once a day dx code 250.00  100 each  5  . irbesartan (AVAPRO) 300 MG tablet TAKE 1 TABLET (300 MG TOTAL) BY MOUTH DAILY.  30 tablet  6  . isosorbide dinitrate (ISORDIL) 10 MG tablet TAKE 1 TABLET (10 MG TOTAL) BY MOUTH DAILY.  90 tablet  3  . levothyroxine (SYNTHROID, LEVOTHROID) 100 MCG tablet TAKE 1 TABLET BY MOUTH EVERY MORNING ON AN EMPTY STOMACH ONCE A DAY  30 tablet  5  . LINZESS 290 MCG CAPS capsule       . Liraglutide (VICTOZA) 18 MG/3ML SOLN Inject 0.17 mLs (1.02 mg total) into the skin daily.  6 mL  1  . meloxicam (MOBIC) 7.5 MG tablet       .  metFORMIN (GLUCOPHAGE-XR) 500 MG 24 hr tablet TAKE 1 TABLET TWICE A DAY  180 tablet  0  . metoprolol (LOPRESSOR) 50 MG tablet Take one and one-half tablets twice a day  90 tablet  11  . ondansetron (ZOFRAN) 4 MG tablet Take 1 tab every 6 hours as needed for nausea.  30 tablet  2  . oxyCODONE (OXY IR/ROXICODONE) 5 MG immediate release tablet 1-2 tabs by mouth every 4-6 hrs for pain, with maximum 6 tabs per day  100 tablet  0  . predniSONE (DELTASONE) 50 MG tablet Take 1 tablet (50 mg total) by mouth daily.  5 tablet  0  . simvastatin (ZOCOR) 20 MG tablet TAKE 1 TABLET BY MOUTH DAILY  90 tablet  1  . sucralfate (CARAFATE) 1 GM/10ML suspension Take 2 teaspoons three times daily before meals.  5400 mL  1  . traMADol (ULTRAM) 50 MG tablet Take 1 tablet (50 mg total) by mouth every 6 (six) hours as needed.  50 tablet  0  . isosorbide mononitrate (IMDUR) 30 MG 24 hr tablet Take 1 tablet (30 mg total) by mouth daily.  30 tablet  11   No current facility-administered medications on file prior to visit.   Review of Systems  Constitutional: Negative for unusual diaphoresis or other sweats  HENT: Negative for ringing in ear Eyes: Negative for double vision or worsening visual disturbance.  Respiratory: Negative for choking and stridor.   Gastrointestinal: Negative for vomiting or other signifcant bowel change Genitourinary: Negative for hematuria or decreased urine volume.  Musculoskeletal: Negative for other MSK pain or swelling Skin: Negative for color change and worsening wound.  Neurological: Negative for tremors and numbness other than noted  Psychiatric/Behavioral: Negative for decreased concentration or agitation other than above       Objective:   Physical Exam BP 142/82  Pulse 85  Temp(Src) 98.2 F (36.8 C) (Oral)  Wt 262 lb 2 oz (118.899 kg)  SpO2 98% VS noted,  Constitutional: Pt appears well-developed, well-nourished.  HENT: Head: NCAT.  Right Ear: External ear normal.  Left Ear:  External ear normal.  Eyes: . Pupils are equal, round, and reactive to light. Conjunctivae and EOM are normal Neck: Normal range of motion. Neck supple.  Cardiovascular: Normal rate and regular rhythm.   Pulmonary/Chest: Effort normal and breath sounds  normal.  Abd:  Soft, NT, ND, + BS Neurological: Pt is alert. Not confused , motor grossly intact Skin: 1-2+ edema bilat le to knees Psychiatric: Pt behavior is normal. No agitation.     Assessment & Plan:

## 2014-03-03 NOTE — Patient Instructions (Addendum)
OK to stop the meloxicam  Please take all new medication as prescribed  - the tramadol as needed for pain (or acetaminophen OTC)  You are given the prescription for the compression stocking and the information on how to obtain them  OK to increase the lasix to 60 mg twice per day (one and 1/2 tabs twice per day)  Please continue all other medications as before, and refills have been done if requested. Please have the pharmacy call with any other refills you may need.  You will be contacted regarding the referral for: renal (Dr Joelyn Oms), and podiatry  Please keep your appointments with your specialists as you have planned  Please return to LAB ONLY in 1 wk to check the kidney function with the new medication changes  Please return in 6 months, or sooner if needed, with Lab testing done 3-5 days before

## 2014-03-03 NOTE — Assessment & Plan Note (Signed)
Also for podiatry referral 

## 2014-03-03 NOTE — Assessment & Plan Note (Addendum)
To d/c nsaid, for tramadol prn,  to f/u any worsening symptoms or concerns, for ortho f.u as planned

## 2014-03-03 NOTE — Progress Notes (Signed)
Pre visit review using our clinic review tool, if applicable. No additional management support is needed unless otherwise documented below in the visit note. 

## 2014-03-10 ENCOUNTER — Ambulatory Visit (INDEPENDENT_AMBULATORY_CARE_PROVIDER_SITE_OTHER): Payer: Medicare Other | Admitting: Endocrinology

## 2014-03-10 ENCOUNTER — Encounter: Payer: Self-pay | Admitting: Endocrinology

## 2014-03-10 VITALS — BP 132/80 | HR 82 | Temp 98.3°F | Resp 16 | Ht 66.0 in | Wt 263.4 lb

## 2014-03-10 DIAGNOSIS — E890 Postprocedural hypothyroidism: Secondary | ICD-10-CM

## 2014-03-10 DIAGNOSIS — IMO0001 Reserved for inherently not codable concepts without codable children: Secondary | ICD-10-CM

## 2014-03-10 DIAGNOSIS — N189 Chronic kidney disease, unspecified: Secondary | ICD-10-CM

## 2014-03-10 DIAGNOSIS — E1165 Type 2 diabetes mellitus with hyperglycemia: Principal | ICD-10-CM

## 2014-03-10 DIAGNOSIS — I10 Essential (primary) hypertension: Secondary | ICD-10-CM

## 2014-03-10 MED ORDER — LEVOTHYROXINE SODIUM 75 MCG PO TABS: 75.0000 ug | ORAL_TABLET | Freq: Every day | ORAL | Status: DC

## 2014-03-10 MED ORDER — VICTOZA 18 MG/3ML ~~LOC~~ SOPN: 1.2000 mg | PEN_INJECTOR | Freq: Every day | SUBCUTANEOUS | Status: DC

## 2014-03-10 NOTE — Patient Instructions (Addendum)
Start VICTOZA injection  once daily at the same time of the day.  Dial the dose to 0.6 mg for the first week.    After 1 week increase the dose by 5 clicks if no nausea.  You may inject in the stomach, thigh or arm.   You will feel fullness of the stomach with starting the medication and should try to keep portions of food small.    Stop all insulin except Novolog at supper  Please check blood sugars at least half the time about 2 hours after any meal and as directed on waking up. Please bring blood sugar monitor to each visit

## 2014-03-10 NOTE — Progress Notes (Signed)
[name] is an 63 y.o. female.   Reason for Appointment: Diabetes follow-up   History of Present Illness   Diagnosis: Type 2 DIABETES MELITUS, date of diagnosis: 2000  Previous history: She had initially been treated with metformin and Amaryl and subsequently given Victoza which helped overall control and weight loss. In 9/13 because of significant hyperglycemia she was started on basal bolus insulin also. Had required relatively small doses of insulin. Her blood sugar control has been excellent with A1c between 5.3-6.0 although this may be relatively higher than expected. Tends to have relatively higher readings after supper  Recent history:  She was last seen in 1/15 and at that time she was told to resume her Victoza and not her Lantus since it had helped her with overall control and also with weight loss. Also had subjectively felt better and was able to leave off insulin For quite some time she has stopped taking the Victoza and she was having some episodes of left abdominal pain. However had taken Victoza previously for a couple of years without side effects. She does have some nausea with the 1.2 mg but was able to tolerate the 0.9 mg dosage as indicated to her previously She was also given information on the assistance program for Victoza initially She has been using Lantus and NovoLog empirically on her own Not clear why she is taking NovoLog at bedtime also She does not eat any proper meals and is snacking throughout the day Is concerned about her weight gain Her A1c is again  normal but historically has been falsely low     Oral hypoglycemic drugs: Amaryl  1 mg and metformin 1 g   Side effects from medications: Nausea from 1.2 Victoza      Lantus 10 Novolog 5 units tid  Monitors blood glucose: Once a day.    Glucometer:  Accu-Chek compact.          Blood Glucose readings from recall: Am 90-112 hs upto 280?  Hypoglycemia: None    Meals: 1-3 meals per day. Supper 5 pm           Physical activity: exercise: Limited, has had fatigue, back and leg pain.           Dietician visit: Most recent: 10/8839           Complications: are: None  Wt Readings from Last 3 Encounters:  03/10/14 263 lb 6.4 oz (119.477 kg)  03/03/14 262 lb 2 oz (118.899 kg)  02/23/14 261 lb 4 oz (118.502 kg)    LABS:  Lab Results  Component Value Date   HGBA1C 5.9 02/23/2014   HGBA1C 5.4 10/12/2013   HGBA1C 5.5 07/06/2013   Lab Results  Component Value Date   MICROALBUR 0.4 02/23/2014   LDLCALC 75 02/23/2014   CREATININE 1.3* 02/23/2014    No visits with results within 1 Week(s) from this visit. Latest known visit with results is:  Clinical Support on 02/24/2014  Component Date Value Ref Range Status  . Free T4 02/24/2014 0.96  0.60 - 1.60 ng/dL Final      Medication List       This list is accurate as of: 03/10/14 11:55 AM.  Always use your most recent med list.               ACCU-CHEK COMPACT CARE KIT Kit  Use as directed to check blood sugar 2 times per day dx code 250.00     AMBULATORY NON FORMULARY MEDICATION  Domperidone 10 mg    Take one tablet by mouth twice a day before meals     amitriptyline 25 MG tablet  Commonly known as:  ELAVIL     amLODipine 5 MG tablet  Commonly known as:  NORVASC  Take 1 tablet (5 mg total) by mouth daily.     aspirin 81 MG EC tablet  Take 81 mg by mouth daily.     cyclobenzaprine 5 MG tablet  Commonly known as:  FLEXERIL  Take 1 tablet (5 mg total) by mouth 3 (three) times daily as needed for muscle spasms.     Diclofenac Sodium 2 % Soln  Commonly known as:  PENNSAID  Place 2 application onto the skin 2 (two) times daily.     ELMIRON 100 MG capsule  Generic drug:  pentosan polysulfate     estradiol 1 MG tablet  Commonly known as:  ESTRACE  TAKE 1 TABLET (1 MG TOTAL) BY MOUTH DAILY.     FLUoxetine 20 MG capsule  Commonly known as:  PROZAC  TAKE 1 CAPSULE (20 MG TOTAL) BY MOUTH DAILY.     furosemide 40 MG tablet   Commonly known as:  LASIX  1 and 1/2 tab by mouth twice per day     gabapentin 300 MG capsule  Commonly known as:  NEURONTIN     glimepiride 1 MG tablet  Commonly known as:  AMARYL  TAKE 1 TABLET WITH SUPPER ONCE A DAY BY MOUTH PER DR. Dwyane Dee     glucose blood test strip  Commonly known as:  ACCU-CHEK COMPACT PLUS  Use as instructed to check blood sugars once a day dx code 250.00     irbesartan 300 MG tablet  Commonly known as:  AVAPRO  TAKE 1 TABLET (300 MG TOTAL) BY MOUTH DAILY.     isosorbide dinitrate 10 MG tablet  Commonly known as:  ISORDIL  TAKE 1 TABLET (10 MG TOTAL) BY MOUTH DAILY.     levothyroxine 100 MCG tablet  Commonly known as:  SYNTHROID, LEVOTHROID  TAKE 1 TABLET BY MOUTH EVERY MORNING ON AN EMPTY STOMACH ONCE A DAY     LINZESS 290 MCG Caps capsule  Generic drug:  Linaclotide     Liraglutide 18 MG/3ML Soln injection  Commonly known as:  VICTOZA  Inject 0.17 mLs (1.02 mg total) into the skin daily.     meloxicam 15 MG tablet  Commonly known as:  MOBIC     metFORMIN 500 MG 24 hr tablet  Commonly known as:  GLUCOPHAGE-XR  TAKE 1 TABLET TWICE A DAY     metoprolol 50 MG tablet  Commonly known as:  LOPRESSOR  Take one and one-half tablets twice a day     ondansetron 4 MG tablet  Commonly known as:  ZOFRAN  Take 1 tab every 6 hours as needed for nausea.     oxyCODONE 5 MG immediate release tablet  Commonly known as:  Oxy IR/ROXICODONE  1-2 tabs by mouth every 4-6 hrs for pain, with maximum 6 tabs per day     predniSONE 50 MG tablet  Commonly known as:  DELTASONE  Take 1 tablet (50 mg total) by mouth daily.     simvastatin 20 MG tablet  Commonly known as:  ZOCOR  TAKE 1 TABLET BY MOUTH DAILY     sucralfate 1 GM/10ML suspension  Commonly known as:  CARAFATE  Take 2 teaspoons three times daily before meals.     traMADol 50 MG tablet  Commonly known as:  ULTRAM  Take 1 tablet (50 mg total) by mouth every 6 (six) hours as needed.         Allergies:  Allergies  Allergen Reactions  . Hydrocodone     REACTION: tachycardia  . Penicillins     Hives    Past Medical History  Diagnosis Date  . COMMON MIGRAINE   . DIVERTICULOSIS-COLON   . CORONARY ARTERY DISEASE   . Gastroparesis   . INSOMNIA-SLEEP DISORDER-UNSPEC   . Iron deficiency anemia   . Anxiety   . DEPRESSION   . Diabetes mellitus, type II   . GERD (gastroesophageal reflux disease)   . Hyperlipidemia   . Hypertension   . Gastric ulcer 04/2008  . CERVICAL RADICULOPATHY, LEFT   . Hiatal hernia   . Hypothyroidism     Past Surgical History  Procedure Laterality Date  . Left knee replacement    . Rigth knee replacement with revision  04/2008    Dr. Berenice Primas  . Gastric wedge resection lipoma  11/2007    x2 with laparotomy and gastrostomy  . Rotator cuff repair  01/2009  . Cholecystectomy  06/2009  . S/p bladder surgury  09/2009    Dr. Terance Hart  . Abdominal hysterectomy      Family History  Problem Relation Age of Onset  . Diabetes Sister     x 3  . Heart disease Sister     x2  . Diabetes Brother     x3  . Heart disease Brother     x2  . Coronary artery disease Other     female 1st degree  . Hypertension Other   . Colon cancer Neg Hx     Social History:  reports that she has quit smoking. She has never used smokeless tobacco. She reports that she does not drink alcohol or use illicit drugs.  Review of Systems:  She has several questions today: 1. She is asking about periodic palpitations. She feels her heart racing at times and then she will take a nitroglycerin. However does not get any chest pain. 2. She is concerned about her ankles swelling. She is already taking 60 mg of Lasix. No history of CHF for shortness of breath 3. She is concerned about her kidney function since she has low urine volume. However her urine protein and serum creatinine are normal  Hypertension: Her blood pressure is treated with metoprolol, Avapro 300 mg and  amlodipine. Not monitoring blood pressure at home recently She tends to have relatively fast heart rate of unclear etiology, possibly autonomic neuropathy  Lipids: Usually well controlled with simvastatin  Post ablative hypothyroidism: She has been on thyroid supplements since she had I-131 treatment on 08/20/11 for Graves' disease  Has been compliant with her thyroid supplement in the morning  Her TSH has been low normal but now it is unusually low No shakiness or sweating but has palpitations  Lab Results  Component Value Date   TSH 0.09* 02/23/2014     Nausea: Less now.  She has been told at [facility] that she has gastroparesis but gastric emptying scan in 4/14 did not confirm this, she does have relatively emptying of 25% at 2 hours    Examination:   BP 132/80  Pulse 82  Temp(Src) 98.3 F (36.8 C)  Resp 16  Ht $R'5\' 6"'yE$  (1.676 m)  Wt 263 lb 6.4 oz (119.477 kg)  BMI 42.53 kg/m2  SpO2 99%  Body mass index is 42.53 kg/(m^2).  No mass in the thyroid area No lymphadenopathy in the neck No pedal edema present, minimal right ankle edema seen Biceps reflexes normal  ASSESSMENT/ PLAN:   Diabetes type 2:   Blood glucose control is difficult to assess as she did not bring her monitor She has gained significant amount of weight by going back on Lantus and NovoLog on her own Previously had done very well with Victoza Do not think that she had any pancreatitis when she had some vague left-sided pains without any nausea She is also taking NovoLog somewhat  appropriately at bedtime Her only high blood sugars are him times after supper based on what she is eating Most likely will benefit from Victoza with overall control and weight loss Have shown the patient how to use the equivalent of 0.9 mg on the Victoza pen and she can try this. If she has consistent nausea may need to go back to 0.6 mg daily Discussed needing to check blood sugars more frequently especially after meals and  bring her monitor for download  Hypothyroidism: TSH low with current regimen. She will change the dose to 75 and followup in one month  HYPERTENSION: Blood pressure is good  Palpitations: Likely to be from high dose of thyroid  Renal insufficiency: Creatinine down to 1.3, consider reducing Avapro to 150 mg, has not had microalbuminuria  She needs to discuss with PCP about her urine volume, not stiff she has a bladder emptying issue  Counseling time over 50% of today's 25 minute visit  Elayne Snare 03/10/2014, 11:55 AM

## 2014-03-11 ENCOUNTER — Other Ambulatory Visit: Payer: Self-pay | Admitting: Internal Medicine

## 2014-03-14 ENCOUNTER — Encounter: Payer: Self-pay | Admitting: Family Medicine

## 2014-03-14 ENCOUNTER — Encounter: Payer: Self-pay | Admitting: Podiatry

## 2014-03-14 ENCOUNTER — Ambulatory Visit (INDEPENDENT_AMBULATORY_CARE_PROVIDER_SITE_OTHER): Payer: Medicare Other | Admitting: Podiatry

## 2014-03-14 ENCOUNTER — Ambulatory Visit (INDEPENDENT_AMBULATORY_CARE_PROVIDER_SITE_OTHER): Payer: Medicare Other | Admitting: Family Medicine

## 2014-03-14 ENCOUNTER — Ambulatory Visit (INDEPENDENT_AMBULATORY_CARE_PROVIDER_SITE_OTHER): Payer: Medicare Other

## 2014-03-14 VITALS — BP 140/80 | HR 83 | Ht 66.0 in | Wt 262.0 lb

## 2014-03-14 VITALS — BP 159/86 | HR 69 | Resp 12

## 2014-03-14 DIAGNOSIS — M7710 Lateral epicondylitis, unspecified elbow: Secondary | ICD-10-CM

## 2014-03-14 DIAGNOSIS — M79601 Pain in right arm: Secondary | ICD-10-CM

## 2014-03-14 DIAGNOSIS — M79609 Pain in unspecified limb: Secondary | ICD-10-CM

## 2014-03-14 DIAGNOSIS — L600 Ingrowing nail: Secondary | ICD-10-CM

## 2014-03-14 DIAGNOSIS — M654 Radial styloid tenosynovitis [de Quervain]: Secondary | ICD-10-CM

## 2014-03-14 DIAGNOSIS — M7711 Lateral epicondylitis, right elbow: Secondary | ICD-10-CM

## 2014-03-14 NOTE — Patient Instructions (Signed)
Good to see you Ice 20 minutes in 6 hours to elbow.  Ice to hand 20 minutes 2 times a day Exercises for the thumb Continue the gabapentin.  Wear brace for thumb day and night for 2 weeks then only at night for 2 weeks Come back in 4 weeks or sooner if you want an injection in your thumb.

## 2014-03-14 NOTE — Assessment & Plan Note (Addendum)
Patient's pain is likely multifactorial. Patient was given an injection today patient tolerated the procedure very well. Patient will also go to formal physical therapy we did check on the patient's progress on having this +. Patient will continue the other activities as well as home exercises, icing, and topical anti-inflammatories. Patient return in 4 weeks for further evaluation.  Spent greater than 25 minutes with patient face-to-face and had greater than 50% of counseling including as described above in assessment and plan.

## 2014-03-14 NOTE — Patient Instructions (Signed)

## 2014-03-14 NOTE — Progress Notes (Signed)
[name] [place], [place] [address] Phone: [phone] Subjective:    CC:  Right elbow pain follow up.   [id number] [name] is a 63 y.o. female coming in for follow up of right elbow pain. Patient does have a past medical history significant for cervical radiculopathy. Patient was diagnosed with lateral epicondylitis of the right elbow. Patient continues to have pain over the right elbow. Patient is never received a call to physical therapy. Patient has continued to try to do the bracing as well as  icing without any significant improvement. Patient denies any worsening pain.      Patient also complains of a new problem of the left thumb. Patient states her to do more activity. Patient has been using his arm more frequently. Patient denies any radiation but has a severe pain with certain movements. Denies any fevers or chills or any abnormal weight loss. Patient has been trying a taping of her wrist which is somewhat beneficial. Patient's tried a topical anti-inflammatory which has been helpful as well.  Past medical history, social, surgical and family history all reviewed in electronic medical record.   Review of Systems: No headache, visual changes, nausea, vomiting, diarrhea, constipation, dizziness, abdominal pain, skin rash, fevers, chills, night sweats, weight loss, swollen lymph nodes, body aches, joint swelling, muscle aches, chest pain, shortness of breath, mood changes.   Objective Blood pressure 140/80, pulse 83, height 5\' 6"  (1.676 m), weight 262 lb (118.842 kg), SpO2 97.00%.  General: No apparent distress alert and oriented x3 mood and affect normal, dressed appropriately.  HEENT: Pupils equal, extraocular movements intact  Respiratory: Patient's speak in full sentences and does not appear short of breath  Cardiovascular: No lower extremity edema, non tender, no erythema  Skin: Warm dry intact with no signs of infection  or rash on extremities or on axial skeleton.  Abdomen: Soft nontender  Neuro: Cranial nerves II through XII are intact, neurovascularly intact in all extremities with 2+ DTRs and 2+ pulses.  Lymph: No lymphadenopathy of posterior or anterior cervical chain or axillae bilaterally.  Gait normal with good balance and coordination.  MSK:  Non tender with full range of motion and good stability and symmetric strength and tone of   hip, knee and ankles bilaterally.  Wrist: Left Inspection normal with no visible erythema or swelling. ROM smooth and normal with good flexion and extension and ulnar/radial deviation that is symmetrical with opposite wrist. Palpation is normal over metacarpals, navicular, lunate, and TFCC; tendons without tenderness/ swelling No snuffbox tenderness. No tenderness over Canal of Guyon. Strength 5/5 in all directions without pain. Positive Finkelstein, negative tinel's and phalens. Negative Watson's test. Tried as stated below  Elbow: Right Unremarkable to inspection. Range of motion full pronation, supination, flexion, extension. Strength is full to all of the above directions but does cause pain with testing mostly with wrist extension. Stable to varus, valgus stress. Negative moving valgus stress test. Tender to palpation over the lateral epicondylar region as well as pain with resisted extension of the middle finger. Ulnar nerve does not sublux. Negative cubital tunnel Tinel's. Contralateral elbow unremarkable  Musculoskeletal ultrasound was performed and interpreted by Charlann Boxer D.O.   Elbow: Right Lateral epicondyle and common extensor tendon origin visualized.  Patient does have some tendinopathy with increasing Doppler flow within the tendon even though no true tear appreciated.  Radial head unremarkable and located in annular ligament Medial epicondyle and common flexor tendon origin  visualized.  No edema, effusions, or avulsions seen. Ulnar nerve in  cubital tunnel unremarkable. Olecranon and triceps insertion visualized and unremarkable without edema, effusion, or avulsion.  No signs olecranon bursitis. Power doppler signal normal.  IMPRESSION:  Chronic lateral epicondylitis  After verbal consent patient was prepped with alcohol swabs and under ultrasound guidance patient was injected with 27-gauge 1-1/2 inch needle into the lateral epicondylar region at the area of increasing Doppler flow. 2 cc of 0.5% Marcaine and 1 cc of Kenalog 40 mg/dL was injected. Patient tolerated the procedure well and post injection instructions given.     Impression and Recommendations:     This case required medical decision making of moderate complexity.

## 2014-03-14 NOTE — Assessment & Plan Note (Signed)
Patient does have more with the cortisone as tendinitis. Patient was given a brace was fitted by me today. We discussed icing protocol that will be helpful as well as topical anti-inflammatories and exercises. Patient was given a handout and showed proper formation. Patient will follow up again with me in 4 weeks for further evaluation. If she continues to have pain we'll consider a injection.

## 2014-03-14 NOTE — Progress Notes (Signed)
   Subjective:    Patient ID: [name], female    DOB: May 23, 1951, 63 y.o.   MRN: [id number]  HPI PT STATED LT FOOT GREAT TOENAIL IS BEEN HURTING FOR 3 MONTHS. THE TOENAIL IS GETTING WORSE. THE TOE GET AGGRAVATED BY PRESSURE AND TRIED NO TREATMENT.    Review of Systems  All other systems reviewed and are negative.      Objective:   Physical Exam        Assessment & Plan:

## 2014-03-15 NOTE — Progress Notes (Signed)
Subjective:     Patient ID: [name], female   DOB: 07-02-1951, 63 y.o.   MRN: [id number]  HPI patient presents stating my big toenail my left foot has been bothering me and making it difficult to wear shoe gear comfortably. I tried to trim it and I tried to soak   Review of Systems  All other systems reviewed and are negative.      Objective:   Physical Exam  Nursing note and vitals reviewed. Cardiovascular: Intact distal pulses.   Musculoskeletal: Normal range of motion.  Neurological: She is alert.  Skin: Skin is warm.   neurovascular status intact with muscle strength adequate and in subtalar midtarsal joint within normal limits. Patient's left hallux medial border is incurvated and sore when pressed and it is thickened along this border with slight redness noted.    Assessment:     Ingrown toenail deformity left hallux medial border    Plan:     H&P reviewed with patient and at this time recommended correction of the deformity. I explained risk and today I infiltrated 60 mg Xylocaine Marcaine mixture remove the medial border exposed matrix and applied 3 applications of phenol 30 seconds followed by alcohol lavaged and sterile dressing. Instructed on soaks and reappoint

## 2014-03-18 ENCOUNTER — Other Ambulatory Visit: Payer: Self-pay | Admitting: Endocrinology

## 2014-04-01 ENCOUNTER — Encounter: Payer: Self-pay | Admitting: Internal Medicine

## 2014-04-01 ENCOUNTER — Ambulatory Visit (INDEPENDENT_AMBULATORY_CARE_PROVIDER_SITE_OTHER): Payer: Medicare Other | Admitting: Internal Medicine

## 2014-04-01 VITALS — BP 114/78 | HR 98 | Temp 98.4°F | Wt 251.6 lb

## 2014-04-01 DIAGNOSIS — R51 Headache: Secondary | ICD-10-CM

## 2014-04-01 NOTE — Progress Notes (Signed)
Pre visit review using our clinic review tool, if applicable. No additional management support is needed unless otherwise documented below in the visit note. 

## 2014-04-01 NOTE — Patient Instructions (Signed)
Chronic daily headaches represent  a conversion of migraines into a headache which recurs repeatedly every day .These are almost always  due to the use of excess nonsteroidals such as ibuprofen or naproxen and or caffeine. The nonsteroidals and caffeine should be weaned as quickly as possible; but they should not be "cold [place]" going from a high dose to none.  Please keep a diary of your headaches . Document  each occurrence on the calendar with notation of : #1 any prodrome ( any non headache symptom such as marked fatigue,visual changes, ,etc ) which precedes actual headache ; #2) severity on 1-10 scale; #3) any triggers ( food/ drink,enviromenntal or weather changes ,physical or emotional stress) in 8-12 hour period prior to the headache; & #4) response to any medications or other intervention. Please review "Headache" @ [facility] for additional information.
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
## 2014-04-06 ENCOUNTER — Other Ambulatory Visit: Payer: Self-pay | Admitting: *Deleted

## 2014-04-06 ENCOUNTER — Other Ambulatory Visit (INDEPENDENT_AMBULATORY_CARE_PROVIDER_SITE_OTHER): Payer: Medicare Other

## 2014-04-06 ENCOUNTER — Telehealth: Payer: Self-pay | Admitting: *Deleted

## 2014-04-06 ENCOUNTER — Other Ambulatory Visit: Payer: Medicare Other

## 2014-04-06 DIAGNOSIS — IMO0001 Reserved for inherently not codable concepts without codable children: Secondary | ICD-10-CM

## 2014-04-06 DIAGNOSIS — E890 Postprocedural hypothyroidism: Secondary | ICD-10-CM

## 2014-04-06 DIAGNOSIS — E1165 Type 2 diabetes mellitus with hyperglycemia: Principal | ICD-10-CM

## 2014-04-06 LAB — COMPREHENSIVE METABOLIC PANEL
ALT: 14 U/L (ref 0–35)
AST: 15 U/L (ref 0–37)
Albumin: 3.4 g/dL — ABNORMAL LOW (ref 3.5–5.2)
Alkaline Phosphatase: 54 U/L (ref 39–117)
BUN: 20 mg/dL (ref 6–23)
CO2: 25 mEq/L (ref 19–32)
Calcium: 9.5 mg/dL (ref 8.4–10.5)
Chloride: 110 mEq/L (ref 96–112)
Creatinine, Ser: 1.4 mg/dL — ABNORMAL HIGH (ref 0.4–1.2)
GFR: 49.48 mL/min — ABNORMAL LOW (ref >60.00)
Glucose, Bld: 103 mg/dL — ABNORMAL HIGH (ref 70–99)
Potassium: 4.7 mEq/L (ref 3.5–5.1)
Sodium: 140 mEq/L (ref 135–145)
Total Bilirubin: 0.8 mg/dL (ref 0.2–1.2)
Total Protein: 7 g/dL (ref 6.0–8.3)

## 2014-04-06 LAB — TSH: TSH: 2.09 u[IU]/mL (ref 0.35–4.50)

## 2014-04-06 LAB — T4, FREE: Free T4: 0.64 ng/dL (ref 0.60–1.60)

## 2014-04-06 LAB — HEMOGLOBIN A1C: Hgb A1c MFr Bld: 5.7 % (ref 4.6–6.5)

## 2014-04-06 MED ORDER — AMLODIPINE BESYLATE-VALSARTAN 10-320 MG PO TABS: 1.0000 | ORAL_TABLET | Freq: Every day | ORAL | Status: DC

## 2014-04-06 NOTE — Telephone Encounter (Signed)
This is not normally necessary, as the insurance will know when the medication is obtained at the pharmacy

## 2014-04-06 NOTE — Telephone Encounter (Signed)
Called pt no answer LMOM md response. So we will send rx for the exforge to her pharmacy once they run the insurance pharmacy will fax over a for to start the prior authorization...Johny Chess

## 2014-04-06 NOTE — Telephone Encounter (Signed)
A user error has taken place: open by mistake.../lmb  

## 2014-04-06 NOTE — Telephone Encounter (Signed)
Left msg on triage stating since pt been off of the exforge her blood pressure in contantly increasing. Been having headaches had to see another md yesterday because her BP was elevated & headaches. Wanting md to contact insurance she is needing to go back on the exforge...[name]

## 2014-04-08 LAB — FRUCTOSAMINE: Fructosamine: 250 umol/L (ref 190–270)

## 2014-04-11 ENCOUNTER — Ambulatory Visit (INDEPENDENT_AMBULATORY_CARE_PROVIDER_SITE_OTHER): Payer: Medicare Other | Admitting: Family Medicine

## 2014-04-11 ENCOUNTER — Ambulatory Visit (INDEPENDENT_AMBULATORY_CARE_PROVIDER_SITE_OTHER): Payer: Medicare Other | Admitting: Endocrinology

## 2014-04-11 ENCOUNTER — Encounter: Payer: Self-pay | Admitting: Family Medicine

## 2014-04-11 ENCOUNTER — Encounter: Payer: Self-pay | Admitting: Endocrinology

## 2014-04-11 ENCOUNTER — Ambulatory Visit (INDEPENDENT_AMBULATORY_CARE_PROVIDER_SITE_OTHER): Payer: Medicare Other

## 2014-04-11 VITALS — BP 108/64 | HR 89 | Ht 66.0 in | Wt 252.0 lb

## 2014-04-11 VITALS — BP 116/66 | HR 84 | Temp 98.5°F | Ht 66.0 in | Wt 256.0 lb

## 2014-04-11 DIAGNOSIS — E1165 Type 2 diabetes mellitus with hyperglycemia: Principal | ICD-10-CM

## 2014-04-11 DIAGNOSIS — M7711 Lateral epicondylitis, right elbow: Secondary | ICD-10-CM

## 2014-04-11 DIAGNOSIS — M79609 Pain in unspecified limb: Secondary | ICD-10-CM

## 2014-04-11 DIAGNOSIS — N289 Disorder of kidney and ureter, unspecified: Secondary | ICD-10-CM

## 2014-04-11 DIAGNOSIS — E890 Postprocedural hypothyroidism: Secondary | ICD-10-CM

## 2014-04-11 DIAGNOSIS — M79645 Pain in left finger(s): Secondary | ICD-10-CM

## 2014-04-11 DIAGNOSIS — M1812 Unilateral primary osteoarthritis of first carpometacarpal joint, left hand: Secondary | ICD-10-CM

## 2014-04-11 DIAGNOSIS — M7710 Lateral epicondylitis, unspecified elbow: Secondary | ICD-10-CM

## 2014-04-11 DIAGNOSIS — M189 Osteoarthritis of first carpometacarpal joint, unspecified: Secondary | ICD-10-CM | POA: Insufficient documentation

## 2014-04-11 DIAGNOSIS — R031 Nonspecific low blood-pressure reading: Secondary | ICD-10-CM

## 2014-04-11 DIAGNOSIS — IMO0001 Reserved for inherently not codable concepts without codable children: Secondary | ICD-10-CM

## 2014-04-11 DIAGNOSIS — M19049 Primary osteoarthritis, unspecified hand: Secondary | ICD-10-CM

## 2014-04-11 NOTE — Progress Notes (Signed)
[name] is an 63 y.o. female.    Reason for Appointment: Diabetes follow-up   History of Present Illness   Diagnosis: Type 2 DIABETES MELITUS, date of diagnosis: 2000  Previous history: She had initially been treated with metformin and Amaryl and subsequently given Victoza which helped overall control and weight loss. In 9/13 because of significant hyperglycemia she was started on basal bolus insulin also. Had required relatively small doses of insulin. Her blood sugar control has been excellent with A1c between 5.3-6.0 although this may be relatively higher than expected. Tends to have relatively higher readings after supper  Recent history:  In 6/15 she was told to resume her Victoza and stop her Lantus since she had been gaining weight with Lantus and NovoLog regimen. Had previously been off insulin completely also She has  tolerated 1.2 mg Victoza. She thinks she has occasional nausea related to other causes However she is concerned about the cost of Victoza again She has done well with good fasting readings off the Lantus insulin However has not done any readings after meals and is taking NovoLog 10 units before supper even though she was advised 5 units She has also lost weight and her A1c is excellent although historically has been falsely low    Oral hypoglycemic drugs: Amaryl  1 mg and metformin 1 g   Side effects from medications:  none INSULIN:  10 Novolog  acs Monitors blood glucose: Once a day.    Glucometer:  Accu-Chek compact.          Blood Glucose readings from recall: Am 80-109, not doing any readings after    Hypoglycemia: None    Meals: 1-3 meals per day. Supper 5 pm          Physical activity: exercise: Limited, has had fatigue, back and leg pain.           Dietician visit: Most recent: 03/3845           Complications: are: None  Wt Readings from Last 3 Encounters:  04/11/14 256 lb (116.121 kg)  04/11/14 252 lb (114.306 kg)  04/01/14 251 lb 9.6 oz  (114.125 kg)    LABS:  Lab Results  Component Value Date   HGBA1C 5.7 04/06/2014   HGBA1C 5.9 02/23/2014   HGBA1C 5.4 10/12/2013   Lab Results  Component Value Date   MICROALBUR 0.4 02/23/2014   LDLCALC 75 02/23/2014   CREATININE 1.4* 04/06/2014    Appointment on 04/06/2014  Component Date Value Ref Range Status  . Hemoglobin A1C 04/06/2014 5.7  4.6 - 6.5 % Final   Glycemic Control Guidelines for People with Diabetes:Non Diabetic:  <6%Goal of Therapy: <7%Additional Action Suggested:  >8%   . Sodium 04/06/2014 140  135 - 145 mEq/L Final  . Potassium 04/06/2014 4.7  3.5 - 5.1 mEq/L Final  . Chloride 04/06/2014 110  96 - 112 mEq/L Final  . CO2 04/06/2014 25  19 - 32 mEq/L Final  . Glucose, Bld 04/06/2014 103* 70 - 99 mg/dL Final  . BUN 04/06/2014 20  6 - 23 mg/dL Final  . Creatinine, Ser 04/06/2014 1.4* 0.4 - 1.2 mg/dL Final  . Total Bilirubin 04/06/2014 0.8  0.2 - 1.2 mg/dL Final  . Alkaline Phosphatase 04/06/2014 54  39 - 117 U/L Final  . AST 04/06/2014 15  0 - 37 U/L Final  . ALT 04/06/2014 14  0 - 35 U/L Final  . Total Protein 04/06/2014 7.0  6.0 - 8.3 g/dL Final  .  Albumin 04/06/2014 3.4* 3.5 - 5.2 g/dL Final  . Calcium 04/06/2014 9.5  8.4 - 10.5 mg/dL Final  . GFR 04/06/2014 49.48* >60.00 mL/min Final  . TSH 04/06/2014 2.09  0.35 - 4.50 uIU/mL Final  . Free T4 04/06/2014 0.64  0.60 - 1.60 ng/dL Final  . Fructosamine 04/06/2014 250  190 - 270 umol/L Final      Medication List       This list is accurate as of: 04/11/14  1:36 PM.  Always use your most recent med list.               ACCU-CHEK COMPACT CARE KIT Kit  Use as directed to check blood sugar 2 times per day dx code 250.00     AMBULATORY NON FORMULARY MEDICATION  Domperidone 10 mg    Take one tablet by mouth twice a day before meals     amitriptyline 25 MG tablet  Commonly known as:  ELAVIL     amLODipine 5 MG tablet  Commonly known as:  NORVASC  Take 1 tablet (5 mg total) by mouth daily.      amLODipine-valsartan 10-320 MG per tablet  Commonly known as:  EXFORGE  Take 1 tablet by mouth daily.     aspirin 81 MG EC tablet  Take 81 mg by mouth daily.     cyclobenzaprine 5 MG tablet  Commonly known as:  FLEXERIL  Take 1 tablet (5 mg total) by mouth 3 (three) times daily as needed for muscle spasms.     Diclofenac Sodium 2 % Soln  Commonly known as:  PENNSAID  Place 2 application onto the skin 2 (two) times daily.     ELMIRON 100 MG capsule  Generic drug:  pentosan polysulfate     estradiol 1 MG tablet  Commonly known as:  ESTRACE  TAKE 1 TABLET (1 MG TOTAL) BY MOUTH DAILY.     FLUoxetine 20 MG capsule  Commonly known as:  PROZAC  TAKE 1 CAPSULE (20 MG TOTAL) BY MOUTH DAILY.     furosemide 40 MG tablet  Commonly known as:  LASIX  1 and 1/2 tab by mouth twice per day     gabapentin 300 MG capsule  Commonly known as:  NEURONTIN     glimepiride 1 MG tablet  Commonly known as:  AMARYL  TAKE 1 TABLET WITH SUPPER ONCE A DAY BY MOUTH PER DR. Dwyane Dee     glucose blood test strip  Commonly known as:  ACCU-CHEK COMPACT PLUS  Use as instructed to check blood sugars once a day dx code 250.00     irbesartan 300 MG tablet  Commonly known as:  AVAPRO  TAKE 1 TABLET (300 MG TOTAL) BY MOUTH DAILY.     isosorbide dinitrate 10 MG tablet  Commonly known as:  ISORDIL  TAKE 1 TABLET (10 MG TOTAL) BY MOUTH DAILY.     levothyroxine 75 MCG tablet  Commonly known as:  SYNTHROID, LEVOTHROID  Take 1 tablet (75 mcg total) by mouth daily before breakfast.     LINZESS 290 MCG Caps capsule  Generic drug:  Linaclotide     meloxicam 15 MG tablet  Commonly known as:  MOBIC     metFORMIN 500 MG 24 hr tablet  Commonly known as:  GLUCOPHAGE-XR  TAKE 1 TABLET TWICE A DAY     metoprolol 50 MG tablet  Commonly known as:  LOPRESSOR  Take one and one-half tablets twice a day     ondansetron 4  MG tablet  Commonly known as:  ZOFRAN  Take 1 tab every 6 hours as needed for nausea.      oxyCODONE 5 MG immediate release tablet  Commonly known as:  Oxy IR/ROXICODONE  1-2 tabs by mouth every 4-6 hrs for pain, with maximum 6 tabs per day     simvastatin 20 MG tablet  Commonly known as:  ZOCOR  TAKE 1 TABLET BY MOUTH DAILY     sucralfate 1 GM/10ML suspension  Commonly known as:  CARAFATE  Take 2 teaspoons three times daily before meals.     traMADol 50 MG tablet  Commonly known as:  ULTRAM  Take 1 tablet (50 mg total) by mouth every 6 (six) hours as needed.     VICTOZA 18 MG/3ML Sopn  Generic drug:  Liraglutide  Inject 1.2 mg into the skin daily. Inject once daily at the same time        Allergies:  Allergies  Allergen Reactions  . Hydrocodone     REACTION: tachycardia  . Penicillins     Hives    Past Medical History  Diagnosis Date  . COMMON MIGRAINE   . DIVERTICULOSIS-COLON   . CORONARY ARTERY DISEASE   . Gastroparesis   . INSOMNIA-SLEEP DISORDER-UNSPEC   . Iron deficiency anemia   . Anxiety   . DEPRESSION   . Diabetes mellitus, type II   . GERD (gastroesophageal reflux disease)   . Hyperlipidemia   . Hypertension   . Gastric ulcer 04/2008  . CERVICAL RADICULOPATHY, LEFT   . Hiatal hernia   . Hypothyroidism     Past Surgical History  Procedure Laterality Date  . Left knee replacement    . Rigth knee replacement with revision  04/2008    Dr. Berenice Primas  . Gastric wedge resection lipoma  11/2007    x2 with laparotomy and gastrostomy  . Rotator cuff repair  01/2009  . Cholecystectomy  06/2009  . S/p bladder surgury  09/2009    Dr. Terance Hart  . Abdominal hysterectomy      Family History  Problem Relation Age of Onset  . Diabetes Sister     x 3  . Heart disease Sister     x2  . Diabetes Brother     x3  . Heart disease Brother     x2  . Coronary artery disease Other     female 1st degree  . Hypertension Other   . Colon cancer Neg Hx     Social History:  reports that she has quit smoking. She has never used smokeless tobacco. She reports  that she does not drink alcohol or use illicit drugs.  Review of Systems:  Hypertension: Her blood pressure is treated with metoprolol, Avapro 300 mg and also Exforge Not monitoring blood pressure at home Blood pressure is relatively low today She tends to have relatively fast heart rate of unclear etiology, possibly autonomic neuropathy  Mild CKD: Her creatinine is relatively high. Not clear why she is taking 2 ARB drugs. 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 thyroid supplements since she had I-131 treatment on 08/20/11 for Graves' disease  Has been compliant with her thyroid  supplement in the morning  Her TSH has been low normal but it was low in 5/15 and with reducing the dose to 75 mcg last month she has no more palpitations   Lab Results  Component Value Date   TSH 2.09 04/06/2014     Nausea: She has been told at [facility] that she has gastroparesis but gastric emptying scan in 4/14 did not confirm this, she does have relatively emptying of 25% at 2 hours    Examination:   BP 116/66  Pulse 84  Temp(Src) 98.5 F (36.9 C) (Oral)  Ht 5' 6" (1.676 m)  Wt 256 lb (116.121 kg)  BMI 41.34 kg/m2  SpO2 97%  Body mass index is 41.34 kg/(m^2).   ASSESSMENT/ PLAN:   Diabetes type 2:   Blood glucose control is difficult to assess as she did not bring her monitor again As discussed in history of present illness she has done fairly well Victoza and has lost weight Not clear what her dose of NovoLog should be at suppertime since she is not monitoring after meals Although she is not reporting hypoglycemia since she is taking 1.2 mg of Victoza she can try reducing her NovoLog dose at suppertime Also  discussed timing of Victoza and she can take it at the same time when her husband is taking are better compliance She has tried a little more walking and encouraged her to continue to lose weight Also discussed if she has nausea certainly she can reduce her Victoza dose by 5 clicks Discussed needing to check blood sugars more frequently especially after meals and bring her monitor for download  Hypothyroidism: TSH normal again with current regimen of 75ug  HYPERTENSION: Blood pressure is low normal. She has not understood her instructions from her PCP and is taking Avapro and valsartan and currently  Renal insufficiency: Creatinine is slightly higher at 1.4 and she was told to stop her Avapro and reduce her meloxicam as much as tolerated She needs to discuss with PCP about her urine volume which is again concerned about, discussed that she has a bladder emptying issue and it is not related to renal issues especially since her last urinalysis showed normal specific gravity  Counseling time over 50% of today's 25 minute visit  [name] 04/11/2014, 1:36 PM

## 2014-04-11 NOTE — Assessment & Plan Note (Signed)
Patient has improved and is one month out from injection. We are hoping that she'll continue to do well. Encourage her to continue the exercises. Once again we discussed formal physical therapy which patient declined. Patient to followup for this as needed.

## 2014-04-11 NOTE — Patient Instructions (Addendum)
Please check blood sugars at least half the time about 2 hours after any meal and 2 times per week on waking up. Please bring blood sugar monitor to each visit  Novolog 6-8 units to keep 2 hour reading 100-150  Victoza daily at the same time  Avapro: stop Meloxicam 1/2 tab

## 2014-04-11 NOTE — Progress Notes (Signed)
[name] [address] [place], [place] [address] Phone: [phone] Subjective:    CC:  Right elbow pain follow up.   [id number] Roda D Melby is a 63 y.o. female coming in for follow up of right elbow pain. Patient does have a past medical history significant for cervical radiculopathy. Patient was diagnosed with lateral epicondylitis of the right elbow. Patient did have an injection as well as was sent to formal physical therapy. Patient was to continue home exercise and, bracing, as well as icing protocol. Patient states she did have to decrease her anti-inflammatories and switched over to tramadol secondary to headache she is having. Patient overall is feeling better with the elbow. Patient states she's been able to do more activity and is fairly happy with the results. Patient still does have chronic pain but the patient said and other medication she is taking seems to be beneficial.  Patient does state that the left thumb area and wrist still is very painful. Patient was also complaining of left thumb pain at last visit. Patient did have what appeared to be more of a de Quervain's tenosynovitis. Patient was given home exercise, we discussed bracing, as well as icing. Patient states that she has been wearing a brace fairly regularly and has not noticed significant improvement. Patient states she's about 20% better denies any new symptoms. Seems to be fairly localized at the base of the thumb she states. No swelling noted no numbness noted.  Past medical history, social, surgical and family history all reviewed in electronic medical record.   Review of Systems: No headache, visual changes, nausea, vomiting, diarrhea, constipation, dizziness, abdominal pain, skin rash, fevers, chills, night sweats, weight loss, swollen lymph nodes, body aches, joint swelling, muscle aches, chest pain, shortness of breath, mood changes.   Objective Blood pressure 108/64,  pulse 89, height 5\' 6"  (1.676 m), weight 252 lb (114.306 kg), SpO2 97.00%.  General: No apparent distress alert and oriented x3 mood and affect normal, dressed appropriately.  HEENT: Pupils equal, extraocular movements intact  Respiratory: Patient's speak in full sentences and does not appear short of breath  Cardiovascular: No lower extremity edema, non tender, no erythema  Skin: Warm dry intact with no signs of infection or rash on extremities or on axial skeleton.  Abdomen: Soft nontender  Neuro: Cranial nerves II through XII are intact, neurovascularly intact in all extremities with 2+ DTRs and 2+ pulses.  Lymph: No lymphadenopathy of posterior or anterior cervical chain or axillae bilaterally.  Gait normal with good balance and coordination.  MSK:  Non tender with full range of motion and good stability and symmetric strength and tone of   hip, knee and ankles bilaterally.  Wrist: Left Inspection normal with no visible erythema or swelling. ROM smooth and normal with good flexion and extension and ulnar/radial deviation that is symmetrical with opposite wrist. Palpation is normal over metacarpals, navicular, lunate, and TFCC; tendons without tenderness/ swelling No snuffbox tenderness. No tenderness over Canal of [place]. Strength 5/5 in all directions without pain. Positive Finkelstein, negative tinel's and phalens. Negative Watson's test. Patient actually is tender to palpation over the MP joint.  Elbow: Right Unremarkable to inspection. Range of motion full pronation, supination, flexion, extension. Strength is full to all of the above directions with significant decreased pain from previous exam Stable to varus, valgus stress. Negative moving valgus stress test. Tender to palpation over the lateral epicondylar region as well as pain with resisted extension of  the middle finger. Ulnar nerve does not sublux. Negative cubital tunnel Tinel's. Contralateral elbow  unremarkable  Musculoskeletal ultrasound was performed and interpreted by Charlann Boxer D.O.   Left wrist Limited ultrasound shows that patient's first [facility] joint on the left hand does have significant arthritis. There is a significant bone spur within the joint itself. Hypoechoic changes are noted. Patient actually has no swelling or hypoechoic changes of the tendon sheath around the abductor pollicis longus.  IMPRESSION:  Osteoarthritis of the left thumb  Procedure After verbal consent patient was molded with a splints in a thumb spica that would be short on the left hand withE XOS material.  Total time 15 minutes for cast application..    Impression and Recommendations:     This case required medical decision making of moderate complexity.

## 2014-04-11 NOTE — Patient Instructions (Signed)
Good to see you.  Ice is still your best friend for your elbow.  For your thumb.  Try the new brace, I think you will like it more.  Wearing a glove at night ca help.  Tylenol 325mg  3 times daily is safe and effective.  Ice the thumb though right after a lot of activity.   You no longer need to do the other exercises for the thumb.  We can always do an injection and try to break up the bone spur if needed.  Come back again in 4-6 weeks.

## 2014-04-11 NOTE — Assessment & Plan Note (Signed)
Patient's ultrasound did not show any hypoechoic changes around the tenosynovitis that she was diagnosed with previously. Patient though does have moderate to severe osteophytic changes of the [facility] joint. Patient was fitted with a brace by me today. This was customized to patient. Continue icing, we'll try a topical anti-inflammatories and come back again in 3 weeks. Patient continues to have pain and will need to consider intra-articular injection under ultrasound guidance.  Spent greater than 25 minutes with patient face-to-face and had greater than 50% of counseling including as described above in assessment and plan.

## 2014-05-03 ENCOUNTER — Other Ambulatory Visit: Payer: Self-pay | Admitting: Endocrinology

## 2014-05-15 ENCOUNTER — Emergency Department (HOSPITAL_COMMUNITY): Payer: Medicare Other

## 2014-05-15 ENCOUNTER — Encounter (HOSPITAL_COMMUNITY): Payer: Self-pay | Admitting: Emergency Medicine

## 2014-05-15 ENCOUNTER — Emergency Department (HOSPITAL_COMMUNITY)
Admission: EM | Admit: 2014-05-15 | Discharge: 2014-05-15 | Disposition: A | Discharge status: Home / Self Care | Payer: Medicare Other | Attending: Emergency Medicine | Admitting: Emergency Medicine

## 2014-05-15 DIAGNOSIS — K219 Gastro-esophageal reflux disease without esophagitis: Secondary | ICD-10-CM | POA: Diagnosis not present

## 2014-05-15 DIAGNOSIS — Z880 Allergy status to penicillin: Secondary | ICD-10-CM | POA: Diagnosis not present

## 2014-05-15 DIAGNOSIS — R1110 Vomiting, unspecified: Secondary | ICD-10-CM | POA: Diagnosis not present

## 2014-05-15 DIAGNOSIS — Z862 Personal history of diseases of the blood and blood-forming organs and certain disorders involving the immune mechanism: Secondary | ICD-10-CM | POA: Insufficient documentation

## 2014-05-15 DIAGNOSIS — Z87891 Personal history of nicotine dependence: Secondary | ICD-10-CM | POA: Diagnosis not present

## 2014-05-15 DIAGNOSIS — Z7982 Long term (current) use of aspirin: Secondary | ICD-10-CM | POA: Diagnosis not present

## 2014-05-15 DIAGNOSIS — F3289 Other specified depressive episodes: Secondary | ICD-10-CM | POA: Diagnosis not present

## 2014-05-15 DIAGNOSIS — E039 Hypothyroidism, unspecified: Secondary | ICD-10-CM | POA: Insufficient documentation

## 2014-05-15 DIAGNOSIS — E785 Hyperlipidemia, unspecified: Secondary | ICD-10-CM | POA: Insufficient documentation

## 2014-05-15 DIAGNOSIS — G43009 Migraine without aura, not intractable, without status migrainosus: Secondary | ICD-10-CM | POA: Insufficient documentation

## 2014-05-15 DIAGNOSIS — Z90710 Acquired absence of both cervix and uterus: Secondary | ICD-10-CM | POA: Diagnosis not present

## 2014-05-15 DIAGNOSIS — Z791 Long term (current) use of non-steroidal anti-inflammatories (NSAID): Secondary | ICD-10-CM | POA: Insufficient documentation

## 2014-05-15 DIAGNOSIS — R1032 Left lower quadrant pain: Secondary | ICD-10-CM | POA: Diagnosis not present

## 2014-05-15 DIAGNOSIS — Z79899 Other long term (current) drug therapy: Secondary | ICD-10-CM | POA: Diagnosis not present

## 2014-05-15 DIAGNOSIS — F329 Major depressive disorder, single episode, unspecified: Secondary | ICD-10-CM | POA: Insufficient documentation

## 2014-05-15 DIAGNOSIS — Z9889 Other specified postprocedural states: Secondary | ICD-10-CM | POA: Diagnosis not present

## 2014-05-15 DIAGNOSIS — Z794 Long term (current) use of insulin: Secondary | ICD-10-CM | POA: Diagnosis not present

## 2014-05-15 DIAGNOSIS — I10 Essential (primary) hypertension: Secondary | ICD-10-CM | POA: Diagnosis not present

## 2014-05-15 DIAGNOSIS — R1030 Lower abdominal pain, unspecified: Secondary | ICD-10-CM

## 2014-05-15 DIAGNOSIS — F411 Generalized anxiety disorder: Secondary | ICD-10-CM | POA: Diagnosis not present

## 2014-05-15 DIAGNOSIS — I2510 Atherosclerotic heart disease of native coronary artery without angina pectoris: Secondary | ICD-10-CM | POA: Insufficient documentation

## 2014-05-15 DIAGNOSIS — E119 Type 2 diabetes mellitus without complications: Secondary | ICD-10-CM | POA: Insufficient documentation

## 2014-05-15 DIAGNOSIS — Z9089 Acquired absence of other organs: Secondary | ICD-10-CM | POA: Diagnosis not present

## 2014-05-15 LAB — COMPREHENSIVE METABOLIC PANEL
ALT: 12 U/L (ref 0–35)
AST: 32 U/L (ref 0–37)
Albumin: 3.7 g/dL (ref 3.5–5.2)
Alkaline Phosphatase: 51 U/L (ref 39–117)
Anion gap: 13 (ref 5–15)
BUN: 26 mg/dL — ABNORMAL HIGH (ref 6–23)
CO2: 19 mEq/L (ref 19–32)
Calcium: 9.4 mg/dL (ref 8.4–10.5)
Chloride: 105 mEq/L (ref 96–112)
Creatinine, Ser: 1.3 mg/dL — ABNORMAL HIGH (ref 0.50–1.10)
GFR calc Af Amer: 49 mL/min — ABNORMAL LOW (ref >90)
GFR calc non Af Amer: 42 mL/min — ABNORMAL LOW (ref >90)
Glucose, Bld: 73 mg/dL (ref 70–99)
Potassium: 6.6 mEq/L (ref 3.7–5.3)
Sodium: 137 mEq/L (ref 137–147)
Total Bilirubin: 0.9 mg/dL (ref 0.3–1.2)
Total Protein: 7.8 g/dL (ref 6.0–8.3)

## 2014-05-15 LAB — CBC WITH DIFFERENTIAL/PLATELET
Basophils Absolute: 0 10*3/uL (ref 0.0–0.1)
Basophils Relative: 0 % (ref 0–1)
Eosinophils Absolute: 0.1 10*3/uL (ref 0.0–0.7)
Eosinophils Relative: 2 % (ref 0–5)
HCT: 34.5 % — ABNORMAL LOW (ref 36.0–46.0)
Hemoglobin: 11.2 g/dL — ABNORMAL LOW (ref 12.0–15.0)
Lymphocytes Relative: 35 % (ref 12–46)
Lymphs Abs: 2.3 10*3/uL (ref 0.7–4.0)
MCH: 32.2 pg (ref 26.0–34.0)
MCHC: 32.5 g/dL (ref 30.0–36.0)
MCV: 99.1 fL (ref 78.0–100.0)
Monocytes Absolute: 0.3 10*3/uL (ref 0.1–1.0)
Monocytes Relative: 5 % (ref 3–12)
Neutro Abs: 3.9 10*3/uL (ref 1.7–7.7)
Neutrophils Relative %: 58 % (ref 43–77)
Platelets: 254 10*3/uL (ref 150–400)
RBC: 3.48 MIL/uL — ABNORMAL LOW (ref 3.87–5.11)
RDW: 13.3 % (ref 11.5–15.5)
WBC: 6.7 10*3/uL (ref 4.0–10.5)

## 2014-05-15 LAB — URINALYSIS, ROUTINE W REFLEX MICROSCOPIC
Bilirubin Urine: NEGATIVE
Glucose, UA: NEGATIVE mg/dL
Hgb urine dipstick: NEGATIVE
Ketones, ur: NEGATIVE mg/dL
Leukocytes, UA: NEGATIVE
Nitrite: NEGATIVE
Protein, ur: NEGATIVE mg/dL
Specific Gravity, Urine: 1.012 (ref 1.005–1.030)
Urobilinogen, UA: 1 mg/dL (ref 0.0–1.0)
pH: 5.5 (ref 5.0–8.0)

## 2014-05-15 LAB — LIPASE, BLOOD: Lipase: 68 U/L — ABNORMAL HIGH (ref 11–59)

## 2014-05-15 LAB — POTASSIUM: Potassium: 5.1 mEq/L (ref 3.7–5.3)

## 2014-05-15 MED ORDER — IOHEXOL 300 MG/ML  SOLN
25.0000 mL | Freq: Once | INTRAMUSCULAR | Status: AC | PRN
  Administered 2014-05-15: 25 mL via ORAL

## 2014-05-15 MED ORDER — HYDROMORPHONE HCL PF 1 MG/ML IJ SOLN
1.0000 mg | Freq: Once | INTRAMUSCULAR | Status: AC
  Administered 2014-05-15: 1 mg via INTRAVENOUS
  Filled 2014-05-15: qty 1

## 2014-05-15 MED ORDER — IOHEXOL 300 MG/ML  SOLN
100.0000 mL | Freq: Once | INTRAMUSCULAR | Status: AC | PRN
  Administered 2014-05-15: 100 mL via INTRAVENOUS

## 2014-05-15 MED ORDER — OXYCODONE-ACETAMINOPHEN 5-325 MG PO TABS
2.0000 | ORAL_TABLET | ORAL | Status: DC | PRN
Start: 2014-05-15 — End: 2014-11-05

## 2014-05-15 MED ORDER — FENTANYL CITRATE 0.05 MG/ML IJ SOLN
50.0000 ug | Freq: Once | INTRAMUSCULAR | Status: AC
  Administered 2014-05-15: 50 ug via NASAL
  Filled 2014-05-15: qty 2

## 2014-05-15 MED ORDER — ONDANSETRON HCL 4 MG/2ML IJ SOLN
4.0000 mg | Freq: Once | INTRAMUSCULAR | Status: AC
  Administered 2014-05-15: 4 mg via INTRAVENOUS
  Filled 2014-05-15: qty 2

## 2014-05-15 NOTE — ED Notes (Signed)
Pt back from CT

## 2014-05-15 NOTE — ED Notes (Signed)
CT tech called nurse to CT to start another IV.  Attempted IV by nurse and unsuccessful.  CT tech and nurse unable to find another IV site.  IV team paged.  Nurse took patient back to room.  Awaiting IV team.

## 2014-05-15 NOTE — ED Notes (Signed)
Pt aware to hold Metformin x 48 hours post contrast.

## 2014-05-15 NOTE — Discharge Instructions (Signed)

## 2014-05-15 NOTE — ED Provider Notes (Signed)
CSN: [id number]     Arrival date & time 05/15/14  1333 History   First MD Initiated Contact with Patient 05/15/14 1542     Chief Complaint  Patient presents with  . Abdominal Pain     (Consider location/radiation/quality/duration/timing/severity/associated sxs/prior Treatment) Patient is a 63 y.o. female presenting with abdominal pain. The history is provided by the patient. No language interpreter was used.  Abdominal Pain Pain location:  LLQ Pain quality: aching, sharp and shooting   Pain radiates to:  Back Pain severity:  Moderate Onset quality:  Unable to specify Duration:  5 days Timing:  Constant Chronicity:  New Context: not alcohol use, not medication withdrawal and not recent illness   Relieved by:  Nothing Worsened by:  Nothing tried Ineffective treatments:  None tried Associated symptoms: vomiting   Associated symptoms: no dysuria and no hematuria   Risk factors: being elderly and multiple surgeries     Past Medical History  Diagnosis Date  . COMMON MIGRAINE   . DIVERTICULOSIS-COLON   . CORONARY ARTERY DISEASE   . Gastroparesis   . INSOMNIA-SLEEP DISORDER-UNSPEC   . Iron deficiency anemia   . Anxiety   . DEPRESSION   . Diabetes mellitus, type II   . GERD (gastroesophageal reflux disease)   . Hyperlipidemia   . Hypertension   . Gastric ulcer 04/2008  . CERVICAL RADICULOPATHY, LEFT   . Hiatal hernia   . Hypothyroidism    Past Surgical History  Procedure Laterality Date  . Left knee replacement    . Rigth knee replacement with revision  04/2008    Dr. Berenice Primas  . Gastric wedge resection lipoma  11/2007    x2 with laparotomy and gastrostomy  . Rotator cuff repair  01/2009  . Cholecystectomy  06/2009  . S/p bladder surgury  09/2009    Dr. Terance Hart  . Abdominal hysterectomy     Family History  Problem Relation Age of Onset  . Diabetes Sister     x 3  . Heart disease Sister     x2  . Diabetes Brother     x3  . Heart disease Brother     x2  . Coronary  artery disease Other     female 1st degree  . Hypertension Other   . Colon cancer Neg Hx    History  Substance Use Topics  . Smoking status: Former Research scientist (life sciences)  . Smokeless tobacco: Never Used     Comment: quit 30 years ago  . Alcohol Use: No   OB History   Grav Para Term Preterm Abortions TAB SAB Ect Mult Living                 Review of Systems  Gastrointestinal: Positive for vomiting and abdominal pain.  Genitourinary: Negative for dysuria and hematuria.  All other systems reviewed and are negative.     Allergies  Hydrocodone and Penicillins  Home Medications   Prior to Admission medications   Medication Sig Start Date End Date Taking? Authorizing Provider  AMBULATORY NON FORMULARY MEDICATION Domperidone 10 mg    Take one tablet by mouth twice a day before meals 08/31/12  Yes Ladene Artist, MD  amLODipine-valsartan (EXFORGE) 10-320 MG per tablet Take 1 tablet by mouth daily. 04/06/14  Yes Biagio Borg, MD  aspirin 81 MG EC tablet Take 81 mg by mouth daily.     Yes Historical Provider, MD  cyclobenzaprine (FLEXERIL) 5 MG tablet Take 1 tablet (5 mg total) by mouth 3 (  three) times daily as needed for muscle spasms. 12/20/13  Yes Lyndal Pulley, DO  Diclofenac Sodium (PENNSAID) 2 % SOLN Place 2 application onto the skin 2 (two) times daily. 01/31/14  Yes Lyndal Pulley, DO  Diclofenac Sodium (PENNSAID) 2 % SOLN Place 2 application onto the skin 2 (two) times daily as needed (pain).   Yes Historical Provider, MD  ELMIRON 100 MG capsule Take 100 mg by mouth 2 (two) times daily.  07/20/13  Yes Historical Provider, MD  estradiol (ESTRACE) 1 MG tablet Take 1 mg by mouth daily.   Yes Historical Provider, MD  FLUoxetine (PROZAC) 20 MG capsule Take 20 mg by mouth daily.   Yes Historical Provider, MD  furosemide (LASIX) 40 MG tablet Take 60 mg by mouth daily as needed for fluid.   Yes Historical Provider, MD  gabapentin (NEURONTIN) 300 MG capsule Take 600 mg by mouth at bedtime.  09/16/13   Yes Historical Provider, MD  glimepiride (AMARYL) 1 MG tablet Take 1 mg by mouth daily with breakfast.   Yes Historical Provider, MD  insulin aspart (NOVOLOG FLEXPEN) 100 UNIT/ML FlexPen Inject 10 Units into the skin 3 (three) times daily with meals.   Yes Historical Provider, MD  isosorbide dinitrate (ISORDIL) 10 MG tablet Take 10 mg by mouth daily.   Yes Historical Provider, MD  levothyroxine (SYNTHROID, LEVOTHROID) 75 MCG tablet Take 1 tablet (75 mcg total) by mouth daily before breakfast. 03/10/14  Yes Elayne Snare, MD  LINZESS 290 MCG CAPS capsule Take 290 mcg by mouth every other day.  10/06/13  Yes Historical Provider, MD  metFORMIN (GLUCOPHAGE-XR) 500 MG 24 hr tablet Take 500 mg by mouth 2 (two) times daily.   Yes Historical Provider, MD  metoprolol (LOPRESSOR) 50 MG tablet Take one and one-half tablets twice a day 05/31/13  Yes Biagio Borg, MD  ondansetron (ZOFRAN) 4 MG tablet Take 1 tab every 6 hours as needed for nausea. 05/03/13  Yes Amy S Esterwood, PA-C  oxyCODONE (OXY IR/ROXICODONE) 5 MG immediate release tablet 1-2 tabs by mouth every 4-6 hrs for pain, with maximum 6 tabs per day 05/11/12  Yes Biagio Borg, MD  simvastatin (ZOCOR) 20 MG tablet Take 20 mg by mouth daily.   Yes Historical Provider, MD  sucralfate (CARAFATE) 1 GM/10ML suspension Take 2 teaspoons three times daily before meals. 11/23/12  Yes Ladene Artist, MD  traMADol (ULTRAM) 50 MG tablet Take 50 mg by mouth every 6 (six) hours as needed for moderate pain.   Yes Historical Provider, MD  VICTOZA 18 MG/3ML SOPN Inject 1.2 mg into the skin daily. Inject once daily at the same time 03/10/14  Yes Elayne Snare, MD   BP 157/83  Pulse 60  Temp(Src) 98 F (36.7 C) (Oral)  Resp 18  SpO2 100% Physical Exam  Nursing note and vitals reviewed. Constitutional: She appears well-developed and well-nourished.  HENT:  Head: Normocephalic.  Right Ear: External ear normal.  Left Ear: External ear normal.  Eyes: EOM are normal. Pupils are  equal, round, and reactive to light.  Neck: Normal range of motion. Neck supple.  Cardiovascular: Normal rate, regular rhythm and normal heart sounds.   Pulmonary/Chest: Effort normal and breath sounds normal.  Abdominal: Soft. There is tenderness.  Musculoskeletal: Normal range of motion.  Neurological: She is alert.  Skin: Skin is warm.  Psychiatric: She has a normal mood and affect.    ED Course  Procedures (including critical care time) Labs Review Labs  Reviewed  CBC WITH DIFFERENTIAL - Abnormal; Notable for the following:    RBC 3.48 (*)    Hemoglobin 11.2 (*)    HCT 34.5 (*)    All other components within normal limits  COMPREHENSIVE METABOLIC PANEL - Abnormal; Notable for the following:    Potassium 6.6 (*)    BUN 26 (*)    Creatinine, Ser 1.30 (*)    GFR calc non Af Amer 42 (*)    GFR calc Af Amer 49 (*)    All other components within normal limits  LIPASE, BLOOD - Abnormal; Notable for the following:    Lipase 68 (*)    All other components within normal limits  URINALYSIS, ROUTINE W REFLEX MICROSCOPIC  POTASSIUM    Imaging Review No results found.   EKG Interpretation None      MDM   Final diagnoses:  None    Pt reports feeling better with medication.   Labs returned  Slight elevation of bun and creatinine.   Lipase slightly elevated.   Ct scan no acute abnormality.  Dr. Venora Maples in to see and examine pt.   Pt advised to follow up with her Md for recheck in 24 hours if pain returns.Rx for percocet.   Pt has nausea medication at home    Fransico Meadow, PA-C 05/15/14 [place], [place] 05/15/14 1940

## 2014-05-15 NOTE — ED Notes (Signed)
Pt c/o LLQ pain x 9 days.  Pt has not urinated since 5 am.  Hx of abdominal surgery.

## 2014-05-15 NOTE — ED Notes (Signed)
IV team at room.

## 2014-05-15 NOTE — ED Provider Notes (Signed)
Medical screening examination/treatment/procedure(s) were conducted as a shared visit with non-physician practitioner(s) and myself.  I personally evaluated the patient during the encounter.   EKG Interpretation None      Suspect recently passed left ureteral stone given colicky nature of pain from left flank radiating towards left groin.  Improvement in symptoms in the emergency department.  CT scan without pathology.  Patient feels better.  Close PCP followup.  Home with pain meds.  She states she has nausea medicine at home.  She understands to return to the ER for new or worsening symptoms  Hoy Morn, MD 05/15/14 [phone]

## 2014-05-15 NOTE — ED Notes (Signed)
Lab called with critical Potassium 6.6, slightly hemolyzed.  PA aware and orders to redraw.

## 2014-05-15 NOTE — ED Notes (Signed)
Called CT to advise IV in patient now.

## 2014-05-15 NOTE — ED Notes (Signed)
Called CT to advise pt done drinking contrast.

## 2014-05-17 ENCOUNTER — Ambulatory Visit (INDEPENDENT_AMBULATORY_CARE_PROVIDER_SITE_OTHER): Payer: Medicare Other | Admitting: Internal Medicine

## 2014-05-17 ENCOUNTER — Encounter: Payer: Self-pay | Admitting: Internal Medicine

## 2014-05-17 VITALS — BP 134/72 | HR 88 | Temp 98.7°F | Wt 247.0 lb

## 2014-05-17 DIAGNOSIS — R3989 Other symptoms and signs involving the genitourinary system: Secondary | ICD-10-CM

## 2014-05-17 DIAGNOSIS — R109 Unspecified abdominal pain: Secondary | ICD-10-CM

## 2014-05-17 DIAGNOSIS — M545 Low back pain, unspecified: Secondary | ICD-10-CM

## 2014-05-17 DIAGNOSIS — E119 Type 2 diabetes mellitus without complications: Secondary | ICD-10-CM

## 2014-05-17 MED ORDER — TRAMADOL HCL 50 MG PO TABS: 50.0000 mg | ORAL_TABLET | Freq: Four times a day (QID) | ORAL | Status: DC | PRN

## 2014-05-17 MED ORDER — CYCLOBENZAPRINE HCL 5 MG PO TABS: 5.0000 mg | ORAL_TABLET | Freq: Three times a day (TID) | ORAL | Status: DC | PRN

## 2014-05-17 MED ORDER — PHENAZOPYRIDINE HCL 95 MG PO TABS: 95.0000 mg | ORAL_TABLET | Freq: Three times a day (TID) | ORAL | Status: DC | PRN

## 2014-05-17 NOTE — Progress Notes (Signed)
Subjective:    Patient ID: [name], female    DOB: [birth year], 63 y.o.   MRN: [id number]  HPI  Here to f/u, c/o abd pain/lower back pain worse x 2-3 days, no fever, and Denies urinary symptoms such as dysuria, frequency, urgency, flank pain, hematuria or n/v, fever, chills.  Has some urinary odor, but no other overt symptoms.  Has taken pain pill but Oxycodone makes her loopy. No longer taking, lower mid abd pain approx 8/10, crampy,Denies worsening reflux, dysphagia, n/v, bowel change or blood.  Pt denies chest pain, increased sob or doe, wheezing, orthopnea, PND, increased LE swelling, palpitations, dizziness or syncope.   Pt denies polydipsia, polyuria, Past Medical History  Diagnosis Date  . COMMON MIGRAINE   . DIVERTICULOSIS-COLON   . CORONARY ARTERY DISEASE   . Gastroparesis   . INSOMNIA-SLEEP DISORDER-UNSPEC   . Iron deficiency anemia   . Anxiety   . DEPRESSION   . Diabetes mellitus, type II   . GERD (gastroesophageal reflux disease)   . Hyperlipidemia   . Hypertension   . Gastric ulcer 04/2008  . CERVICAL RADICULOPATHY, LEFT   . Hiatal hernia   . Hypothyroidism    Past Surgical History  Procedure Laterality Date  . Left knee replacement    . Rigth knee replacement with revision  04/2008    Dr. Berenice Primas  . Gastric wedge resection lipoma  11/2007    x2 with laparotomy and gastrostomy  . Rotator cuff repair  01/2009  . Cholecystectomy  06/2009  . S/p bladder surgury  09/2009    Dr. Terance Hart  . Abdominal hysterectomy      reports that she has quit smoking. She has never used smokeless tobacco. She reports that she does not drink alcohol or use illicit drugs. family history includes Coronary artery disease in her other; Diabetes in her brother and sister; Heart disease in her brother and sister; Hypertension in her other. There is no history of Colon cancer. Allergies  Allergen Reactions  . Ciprofloxacin     dizzy  . Hydrocodone     REACTION: tachycardia  .  Penicillins     Hives    Review of Systems  Constitutional: Negative for unusual diaphoresis or other sweats  HENT: Negative for ringing in ear Eyes: Negative for double vision or worsening visual disturbance.  Respiratory: Negative for choking and stridor.   Gastrointestinal: Negative for vomiting or other signifcant bowel change Genitourinary: Negative for hematuria or decreased urine volume.  Musculoskeletal: Negative for other MSK pain or swelling Skin: Negative for color change and worsening wound.  Neurological: Negative for tremors and numbness other than noted  Psychiatric/Behavioral: Negative for decreased concentration or agitation other than above       Objective:   Physical Exam BP 134/72  Pulse 88  Temp(Src) 98.7 F (37.1 C) (Oral)  Wt 247 lb (112.038 kg)  SpO2 97% VS noted, in pain, non toxic Constitutional: Pt appears well-developed, well-nourished.  HENT: Head: NCAT.  Right Ear: External ear normal.  Left Ear: External ear normal.  Eyes: . Pupils are equal, round, and reactive to light. Conjunctivae and EOM are normal Neck: Normal range of motion. Neck supple.  Cardiovascular: Normal rate and regular rhythm.   Pulmonary/Chest: Effort normal and breath sounds normal.  Abd:  Soft, ND, + BS, tender low mid abd, no guarding or rebound Neurological: Pt is alert. Not confused , motor grossly intact Skin: Skin is warm. No rash Spine nontender, no significant paravertebral tender,  swelling, rash Psychiatric: Pt behavior is normal. No agitation.     Assessment & Plan:

## 2014-05-17 NOTE — Progress Notes (Signed)
Pre visit review using our clinic review tool, if applicable. No additional management support is needed unless otherwise documented below in the visit note. 

## 2014-05-17 NOTE — Patient Instructions (Signed)
Please take all new medication as prescribed - the pyridium for bladder pain, and the flexeril for back spasms  Please continue all other medications as before, and refills have been done if requested. - the tramadol  Please have the pharmacy call with any other refills you may need.  You will be contacted regarding the referral for: urology - Dr McDermott/urology  Please keep your appointments with your specialists as you may have planned  Please go to the LAB in the Basement (turn left off the elevator) for the tests to be done tomorrow - just the urine testing

## 2014-05-18 ENCOUNTER — Other Ambulatory Visit (INDEPENDENT_AMBULATORY_CARE_PROVIDER_SITE_OTHER): Payer: Medicare Other

## 2014-05-18 ENCOUNTER — Telehealth: Payer: Self-pay | Admitting: Internal Medicine

## 2014-05-18 ENCOUNTER — Other Ambulatory Visit: Payer: Self-pay | Admitting: Internal Medicine

## 2014-05-18 DIAGNOSIS — R3989 Other symptoms and signs involving the genitourinary system: Secondary | ICD-10-CM

## 2014-05-18 LAB — URINALYSIS, ROUTINE W REFLEX MICROSCOPIC
Bilirubin Urine: NEGATIVE
Hgb urine dipstick: NEGATIVE
Ketones, ur: 15 — AB
Nitrite: POSITIVE — AB
Specific Gravity, Urine: 1.02 (ref 1.000–1.030)
Total Protein, Urine: >=300 — AB
Urine Glucose: 250 — AB
Urobilinogen, UA: >=8 — AB (ref 0.0–1.0)
pH: 5 (ref 5.0–8.0)

## 2014-05-18 MED ORDER — CIPROFLOXACIN HCL 500 MG PO TABS: 500.0000 mg | ORAL_TABLET | Freq: Two times a day (BID) | ORAL | Status: DC

## 2014-05-18 NOTE — Telephone Encounter (Signed)
Called the patient informed her husband [name] of results and medication at the pharmacy.

## 2014-05-18 NOTE — Telephone Encounter (Signed)
Ok for robin to let pt know; UA does seem c/w UTI -   For cipro course - done erx

## 2014-05-19 ENCOUNTER — Telehealth: Payer: Self-pay | Admitting: *Deleted

## 2014-05-19 ENCOUNTER — Telehealth: Payer: Self-pay | Admitting: Internal Medicine

## 2014-05-19 MED ORDER — NITROFURANTOIN MONOHYD MACRO 100 MG PO CAPS: 100.0000 mg | ORAL_CAPSULE | Freq: Two times a day (BID) | ORAL | Status: DC

## 2014-05-19 NOTE — Telephone Encounter (Signed)
Ok to change to macrobid bid - done erx

## 2014-05-19 NOTE — Telephone Encounter (Signed)
[facility] Triage Call Report Triage Record Num: [id number] Operator: Evorn Gong Patient Name: [name] Call Date & Time: 05/15/2014 11:39:32AM Patient Phone: [phone] PCP: Cathlean Cower Patient Gender: Female PCP Fax : [phone] Patient DOB: August 15, 1951 Practice Name: [facility] Reason for Call: Caller: [name]/Patient; PCP: Cathlean Cower (Adults only); CB#: [phone]; Call regarding Urinary Pain/Bleeding; Pt states that she is ubale to urinate. Has urinated once at 0530 05/15/2014 and that is last time since yesterday afternoon. Having pain in left lower abdominal area. Feels like stabbing pain, states that it goes through to back. Afebrile. Nausea present. States that she has been taking BC powders. Urine is dark and concentrated. Triaged per Flank Pain guideline. Dispositioned to see ED immediately per "Unbearable abdominal/pelvic pain". Advised pt to procced to ED now and she agrees with plan Protocol(s) Used: Flank Pain Protocol(s) Used: Urinary Symptoms - Female Recommended Outcome per Protocol: See ED Immediately Reason for Outcome: Flank pain Unbearable abdominal/pelvic pain Care Advice: ~ Do not push on abdomen. ~ IMMEDIATE ACTION 08/

## 2014-05-19 NOTE — Telephone Encounter (Signed)
Patient's husband is calling in regards to the patient's side effects from taking Cipro. He states that she complains that the medication is making her feel dizzy. They want to know if something else can be prescribed instead. Please advise.

## 2014-05-20 ENCOUNTER — Encounter: Payer: Self-pay | Admitting: Internal Medicine

## 2014-05-21 LAB — URINE CULTURE: Colony Count: >=100000

## 2014-05-23 ENCOUNTER — Ambulatory Visit (INDEPENDENT_AMBULATORY_CARE_PROVIDER_SITE_OTHER): Payer: Medicare Other | Admitting: Family Medicine

## 2014-05-23 ENCOUNTER — Encounter: Payer: Self-pay | Admitting: Family Medicine

## 2014-05-23 ENCOUNTER — Other Ambulatory Visit (INDEPENDENT_AMBULATORY_CARE_PROVIDER_SITE_OTHER): Payer: Medicare Other

## 2014-05-23 VITALS — BP 94/60 | HR 106 | Ht 66.0 in | Wt 255.0 lb

## 2014-05-23 DIAGNOSIS — M79645 Pain in left finger(s): Secondary | ICD-10-CM

## 2014-05-23 DIAGNOSIS — M79609 Pain in unspecified limb: Secondary | ICD-10-CM

## 2014-05-23 DIAGNOSIS — M1812 Unilateral primary osteoarthritis of first carpometacarpal joint, left hand: Secondary | ICD-10-CM

## 2014-05-23 DIAGNOSIS — M19049 Primary osteoarthritis, unspecified hand: Secondary | ICD-10-CM

## 2014-05-23 DIAGNOSIS — N3010 Interstitial cystitis (chronic) without hematuria: Secondary | ICD-10-CM | POA: Insufficient documentation

## 2014-05-23 NOTE — Assessment & Plan Note (Signed)
Exam benign, suspect referred pain,  to f/u any worsening symptoms or concerns

## 2014-05-23 NOTE — Assessment & Plan Note (Signed)
Patient was given a corticosteroid injection today. We discussed continuing the bracing as well as the icing. Patient was given another prescription for the topical anti-inflammatories. We discussed the patient does have moderate to severe amount of arthritis in this area and would respond well hopefully to this injection. She will followup in 3-4 weeks. We also discussed the possibility of formal physical therapy which patient declined.  Spent greater than 25 minutes with patient face-to-face and had greater than 50% of counseling including as described above in assessment and plan.

## 2014-05-23 NOTE — Assessment & Plan Note (Signed)
Lab Results  Component Value Date   HGBA1C 5.7 04/06/2014   Pt to f/u onset polys or cbg > 200 with current illness

## 2014-05-23 NOTE — Patient Instructions (Signed)
It is good to see you.  Ice is your friend We did an injection today that I think will be really helpful Conitnue the pennsaid and would brace at least at night Come back in 3-4 weeks.

## 2014-05-23 NOTE — Progress Notes (Signed)
  [name] [address] [place], [place] [address] Phone: [phone] Subjective:    CC:  Left thumb pain  [id number] [name] is a 63 y.o. female coming in for follow up of left thumb pain. Patient states that the brace is now making any significant improvement but she also notices that she has not been wearing it religiously. Patient has not been doing the exercises and continues to use a topical anti-inflammatory as needed. Patient states that tramadol seems to help somewhat. Patient states that any activity where she uses her thumb a significant amount of time give her some pain. Patient is still able to do daily activities but states that she is sore all the time. Patient is becoming frustrated. Denies any nighttime awakening. Denies any numbness or any significant tingling.    No swelling noted no numbness noted.  Past medical history, social, surgical and family history all reviewed in electronic medical record.   Review of Systems: No headache, visual changes, nausea, vomiting, diarrhea, constipation, dizziness, abdominal pain, skin rash, fevers, chills, night sweats, weight loss, swollen lymph nodes, body aches, joint swelling, muscle aches, chest pain, shortness of breath, mood changes.   Objective Blood pressure 94/60, pulse 106, height 5\' 6"  (1.676 m), weight 255 lb (115.667 kg), SpO2 98.00%.  General: No apparent distress alert and oriented x3 mood and affect normal, dressed appropriately.  HEENT: Pupils equal, extraocular movements intact  Respiratory: Patient's speak in full sentences and does not appear short of breath  Cardiovascular: No lower extremity edema, non tender, no erythema  Skin: Warm dry intact with no signs of infection or rash on extremities or on axial skeleton.  Abdomen: Soft nontender  Neuro: Cranial nerves II through XII are intact, neurovascularly intact in all extremities with 2+ DTRs and 2+ pulses.  Lymph: No  lymphadenopathy of posterior or anterior cervical chain or axillae bilaterally.  Gait normal with good balance and coordination.  MSK:  Non tender with full range of motion and good stability and symmetric strength and tone of   hip, knee and ankles bilaterally.  Wrist: Left Inspection normal with no visible erythema or swelling. ROM smooth and normal with good flexion and extension and ulnar/radial deviation that is symmetrical with opposite wrist. Palpation is normal over metacarpals, navicular, lunate, and TFCC; tendons without tenderness/ swelling patient who is severely tender over the [facility] joint No snuffbox tenderness. No tenderness over Canal of Gu[place]. Strength 5/5 in all directions without pain. Positive Finkelstein, negative tinel's and phalens. Positive grind test Negative Watson's test. Patient actually is tender to palpation over the MP joint.   Musculoskeletal ultrasound was performed and interpreted by Charlann Boxer D.O.   Left wrist Limited ultrasound shows that patient's first [facility] joint on the left hand does have significant arthritis. There is a significant bone spur within the joint itself. Hypoechoic changes are noted. Patient actually has no swelling or hypoechoic changes of the tendon sheath around the abductor pollicis longus.  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   Procedure After verbal consent patient was prepped with alcohol swabs and under ultrasound guidance was injected with a 25-gauge 1 inch needle into the [facility] joint with 0.5 cc of 0.5% Marcaine and 0.5 cc of Kenalog 40 mg/dL. Patient tolerated the procedure well with good resolution of pain.     Impression and Recommendations:     This case required medical decision making of moderate complexity.

## 2014-05-23 NOTE — Assessment & Plan Note (Signed)
Atypical for UTI, but still suspect UTI vs Intersticial cystitis, for tramadol prnm, pyridium prn, UA, and referral to Dr McDermott/urology

## 2014-05-24 ENCOUNTER — Ambulatory Visit: Payer: Medicare Other | Admitting: Internal Medicine

## 2014-05-30 ENCOUNTER — Other Ambulatory Visit: Payer: Self-pay | Admitting: Internal Medicine

## 2014-05-30 ENCOUNTER — Other Ambulatory Visit: Payer: Self-pay | Admitting: Endocrinology

## 2014-06-11 ENCOUNTER — Other Ambulatory Visit: Payer: Self-pay | Admitting: Internal Medicine

## 2014-06-14 NOTE — Telephone Encounter (Signed)
Faxed hardcopy for Tramadol to [facility]. [place]

## 2014-06-14 NOTE — Telephone Encounter (Signed)
Done hardcopy to robin  

## 2014-06-26 ENCOUNTER — Other Ambulatory Visit: Payer: Self-pay | Admitting: Endocrinology

## 2014-06-27 ENCOUNTER — Ambulatory Visit (INDEPENDENT_AMBULATORY_CARE_PROVIDER_SITE_OTHER): Payer: Medicare Other | Admitting: Family Medicine

## 2014-06-27 ENCOUNTER — Encounter: Payer: Self-pay | Admitting: Family Medicine

## 2014-06-27 VITALS — BP 168/72 | HR 86 | Ht 66.0 in | Wt 252.0 lb

## 2014-06-27 DIAGNOSIS — M5412 Radiculopathy, cervical region: Secondary | ICD-10-CM

## 2014-06-27 MED ORDER — VENLAFAXINE HCL 37.5 MG PO TABS: 75.0000 mg | ORAL_TABLET | Freq: Two times a day (BID) | ORAL | Status: DC

## 2014-06-27 NOTE — Progress Notes (Signed)
[name] [address] [place], [place] [address] Phone: [phone] Subjective:    CC:  Left thumb pain, right arm pain  [id number] [name] is a 63 y.o. female coming in for follow up of left thumb pain. Patient was seen previously for [facility] arthritis and states that this is better to a certain extent after injection. Patient has not worn the brace regularly and does not do any exercises. Patient states that it does hurt at the end of a long day.    Patient states that her worst pain seems to be her right arm. Patient states that she is having a dull throbbing aching pain that feels like a toothache that is going from her shoulder all the way down to her elbow and sometimes to her hand. Patient states that this is worsening. 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 Patient continues on her pain medication and has increased her gabapentin 900 mg at night. Patient denies any weakness but states that the pain can stop her from activities. States that she has tried changing pillows with no significant improvement.    Past medical history, social, surgical and family history all reviewed in electronic medical record.   Review of Systems: No headache, visual changes, nausea, vomiting, diarrhea, constipation, dizziness, abdominal pain, skin rash, fevers, chills, night sweats, weight loss, swollen lymph nodes, body aches, joint swelling, muscle aches, chest pain, shortness of breath, mood changes.   Objective Blood pressure 168/72, pulse 86, height 5\' 6"  (1.676 m), weight 252 lb (114.306 kg), SpO2 96.00%.  General: No apparent distress alert and oriented x3 mood and affect normal, dressed appropriately.  HEENT: Pupils equal, extraocular movements intact  Respiratory: Patient's speak in full sentences and does not appear  short of breath  Cardiovascular: No lower extremity edema, non tender, no erythema  Skin: Warm dry intact with no signs of infection or rash on extremities or on axial skeleton.  Abdomen: Soft nontender  Neuro: Cranial nerves II through XII are intact, neurovascularly intact in all extremities with 2+ DTRs and 2+ pulses.  Lymph: No lymphadenopathy of posterior or anterior cervical chain or axillae bilaterally.  Gait normal with good balance and coordination.  MSK:  Non tender with full range of motion and good stability and symmetric strength and tone of   hip, knee and ankles bilaterally.  Wrist: Left Inspection normal with no visible erythema or swelling. ROM smooth and normal with good flexion and extension and ulnar/radial deviation that is symmetrical with opposite wrist. Palpation is normal over metacarpals, navicular, lunate, and TFCC; tendons without tenderness/ swelling  No snuffbox tenderness. No tenderness over Canal of [place]. Strength 5/5 in all directions without pain. Positive Finkelstein, negative tinel's and phalens. Positive grind test Negative Watson's test. Patient actually is tender to palpation over the [facility] joint but improved from previous exam Neck: Inspection mild decrease in lordosis with increasing kyphosis at T1 vertebrae. No palpable stepoffs. Positive Spurling's maneuver with radicular symptoms going down the right arm. Mild limitation with side bending and rotation bilaterally. Grip strength and sensation normal in bilateral hands Strength good C4 to T1 distribution No sensory change to C4 to T1 Negative Hoffman sign bilaterally Reflexes normal Shoulder exam shows some mild impingement-type syndromes on right side but no weakness.       Impression and Recommendations:  This case required medical decision making of moderate complexity.

## 2014-06-27 NOTE — Assessment & Plan Note (Signed)
The patient's pain and think is secondary to radiculopathy. Patient has had an different axial skeletal problem previously. Patient's x-rays that were previously done in February would be consistent with the findings she is having. I think that this is likely an exacerbation. I do not feel that 900 mg gabapentin at night is beneficial and could actually cause harm so instead we'll titrate down and spread out over the course of 3 times a day dosing. Please see patient instructions. I do feel that patient may also have some underlying chronic pain type syndrome. Patient is going to be started on Effexor and we'll titrate up slowly and discontinue patient's Prozac over the course of the next several weeks. Patient will try to do these changes and come back and see me in 3-4 weeks. Continuing to have difficulty we need to consider further imaging including MRI. In the interim patient will do conservative therapy including home exercises, icing, and the topical medications as well as the over-the-counter medications we've discussed with patient previously.  Spent greater than 25 minutes with patient face-to-face and had greater than 50% of counseling including as described above in assessment and plan.

## 2014-06-27 NOTE — Patient Instructions (Signed)
Good to see you as always.  We are going to try effexor.  Take 1 pill 2 times daily for first week with the prozac Second week increase the effexor to 2 pills 2 times a day and stop the effexor.  Change gabapentin to 300mg  in AM, 300mg  in PM and 600mg  at night.  Exercises for your neck.  Call me in 1-2 weeks and tell me how you are doing if better I would like to see you in 1 month. If not better we will get MRI of your neck and I would like to see you 1-2 days after MRI.

## 2014-07-06 ENCOUNTER — Telehealth: Payer: Self-pay | Admitting: Family Medicine

## 2014-07-06 DIAGNOSIS — M5412 Radiculopathy, cervical region: Secondary | ICD-10-CM

## 2014-07-06 NOTE — Telephone Encounter (Signed)
Patient is requesting to be worked in with Dr. Tamala Julian today b.c of joint swelling

## 2014-07-07 ENCOUNTER — Other Ambulatory Visit: Payer: Medicare Other

## 2014-07-08 ENCOUNTER — Encounter: Payer: Self-pay | Admitting: Family Medicine

## 2014-07-08 ENCOUNTER — Ambulatory Visit (INDEPENDENT_AMBULATORY_CARE_PROVIDER_SITE_OTHER): Payer: Medicare Other | Admitting: Family Medicine

## 2014-07-08 ENCOUNTER — Ambulatory Visit (INDEPENDENT_AMBULATORY_CARE_PROVIDER_SITE_OTHER)
Admission: RE | Admit: 2014-07-08 | Discharge: 2014-07-08 | Disposition: A | Discharge status: Home / Self Care | Payer: Medicare Other | Source: Ambulatory Visit | Attending: Family Medicine | Admitting: Family Medicine

## 2014-07-08 VITALS — BP 120/78 | HR 90

## 2014-07-08 DIAGNOSIS — M79641 Pain in right hand: Secondary | ICD-10-CM

## 2014-07-08 DIAGNOSIS — S62306A Unspecified fracture of fifth metacarpal bone, right hand, initial encounter for closed fracture: Secondary | ICD-10-CM | POA: Insufficient documentation

## 2014-07-08 DIAGNOSIS — M5412 Radiculopathy, cervical region: Secondary | ICD-10-CM

## 2014-07-08 NOTE — Patient Instructions (Signed)
Continue the effexor  We will get an MRi of your neck.  I will call you with the results and see if there is an area where we can do an injection.  Xray of hand downstairs today to look at that fracture.  Tramadol for pain relief Continue the other medicines.  Come back for the hand in 3 weeks.

## 2014-07-08 NOTE — Assessment & Plan Note (Signed)
Patient does have severe radicular symptoms I think is becoming significantly worse. Patient's MRI in 2000 tendon showed some moderate stenosis at that time. I do think that further imaging is necessary in an MRI was ordered. We discussed an icing regimen as well as continuing to gabapentin any other medications a regular basis. Patient will try ibuprofen 800 mg with Tylenol as well as tramadol for breakthrough pain. Patient will avoid any excessive activities at this time. Patient will try these interventions as well as once patient has the MRI we'll discuss further intervention. I will call patient and will discuss either epidural steroid injections which referral for neurosurgery due to the chronicity of this problem.  Spent greater than 25 minutes with patient face-to-face and had greater than 50% of counseling including as described above in assessment and plan.

## 2014-07-08 NOTE — Progress Notes (Signed)
  [name] [address] [place], [place] [address] Phone: [phone] Subjective:    CC:   right arm pain  [id number] [name] is a 63 y.o. female coming in for follow up of lr worst pain seems to be her right arm. Patient states that she is having a dull throbbing aching pain that feels like a toothache that is going from her shoulder all the way down to her elbow and sometimes to her hand. Patient states that this is worsening. 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 Patient started on Effexor and states that she has not noticed any significant improvement. Patient also states that she has some swelling in her hand. Patient states that is severely tender in for the last 4 days has had difficulty moving the hand. Patient denies any recent changes in medication or food. Patient does not have her any true injury. Patient states that the pain is getting so severe that she cannot do regular tolerable activities. Pain is waking her up at night. Continues to take tramadol on a regular basis.   Past medical history, social, surgical and family history all reviewed in electronic medical record.   Review of Systems: No headache, visual changes, nausea, vomiting, diarrhea, constipation, dizziness, abdominal pain, skin rash, fevers, chills, night sweats, weight loss, swollen lymph nodes, body aches, joint swelling, muscle aches, chest pain, shortness of breath, mood changes.   Objective Blood pressure 120/78, pulse 90, SpO2 97.00%.  General: No apparent distress alert and oriented x3 mood and affect normal, dressed appropriately.  HEENT: Pupils equal, extraocular movements intact  Respiratory: Patient's speak in full sentences and does not appear short of breath  Cardiovascular: No lower extremity edema, non tender, no erythema   Skin: Warm dry intact with no signs of infection or rash on extremities or on axial skeleton.  Abdomen: Soft nontender  Neuro: Cranial nerves II through XII are intact, neurovascularly intact in all extremities with 2+ DTRs and 2+ pulses.  Lymph: No lymphadenopathy of posterior or anterior cervical chain or axillae bilaterally.  Gait normal with good balance and coordination.  MSK:  Non tender with full range of motion and good stability and symmetric strength and tone of   hip, knee and ankles bilaterally.  HEENT exam on the right side so some significant dorsal swelling of the hand over the fourth and fifth metacarpals. Patient is severely tender over the fifth metacarpal. Neck: Inspection mild decrease in lordosis with increasing kyphosis at T1 vertebrae. No palpable stepoffs. Positive Spurling's maneuver with radicular symptoms going down the right arm. Mild limitation with side bending and rotation bilaterally. Grip strength and sensation normal in bilateral hands Patient does have some weakness in the C7-T1 distribution. No sensory change to C4 to T1 Negative Hoffman sign bilaterally Reflexes normal Shoulder exam shows some mild impingement-type syndromes on right side but no weakness.       Impression and Recommendations:     This case required medical decision making of moderate complexity.

## 2014-07-08 NOTE — Assessment & Plan Note (Signed)
Patient does have a fracture of the fifth metacarpal today. We're getting x-rays to confirm this prolonged patient's presentation it seems to be diagnostic. Patient also does not have any significant angulation. Patient is put in a ulnar gutter splint today. This was done by myself. We discussed an icing regimen. We discussed medications to treat have done in the past. Patient does not remember any injuries to remaining to consider further workup we do not see any healing. Patient will come back in 3 weeks for further evaluation and treatment.

## 2014-07-12 ENCOUNTER — Other Ambulatory Visit: Payer: Self-pay | Admitting: Internal Medicine

## 2014-07-12 ENCOUNTER — Ambulatory Visit: Payer: Medicare Other | Admitting: Endocrinology

## 2014-07-13 ENCOUNTER — Encounter: Payer: Self-pay | Admitting: *Deleted

## 2014-07-13 ENCOUNTER — Ambulatory Visit: Payer: Medicare Other | Admitting: Endocrinology

## 2014-07-13 DIAGNOSIS — Z0289 Encounter for other administrative examinations: Secondary | ICD-10-CM

## 2014-07-15 ENCOUNTER — Ambulatory Visit
Admission: RE | Admit: 2014-07-15 | Discharge: 2014-07-15 | Disposition: A | Discharge status: Home / Self Care | Payer: Medicare Other | Source: Ambulatory Visit | Attending: Family Medicine | Admitting: Family Medicine

## 2014-07-15 DIAGNOSIS — M5412 Radiculopathy, cervical region: Secondary | ICD-10-CM

## 2014-07-18 NOTE — Telephone Encounter (Signed)
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 She will maybe need surgery to help it.       Please call and tell patient plan.       ----- Message -----         From: Rad Results In Interface         Sent: 07/15/2014   2:43 PM           To: Lyndal Pulley, DO                   ------

## 2014-07-18 NOTE — Telephone Encounter (Signed)
Referral entered  

## 2014-07-25 ENCOUNTER — Other Ambulatory Visit: Payer: Self-pay | Admitting: Endocrinology

## 2014-07-25 ENCOUNTER — Other Ambulatory Visit: Payer: Self-pay | Admitting: Family Medicine

## 2014-07-25 NOTE — Telephone Encounter (Signed)
Refill done.  

## 2014-07-26 DIAGNOSIS — M75100 Unspecified rotator cuff tear or rupture of unspecified shoulder, not specified as traumatic: Secondary | ICD-10-CM | POA: Insufficient documentation

## 2014-07-29 ENCOUNTER — Other Ambulatory Visit: Payer: Self-pay | Admitting: Internal Medicine

## 2014-08-01 ENCOUNTER — Encounter: Payer: Self-pay | Admitting: Family Medicine

## 2014-08-01 ENCOUNTER — Ambulatory Visit (INDEPENDENT_AMBULATORY_CARE_PROVIDER_SITE_OTHER): Payer: Medicare Other | Admitting: Family Medicine

## 2014-08-01 ENCOUNTER — Ambulatory Visit: Payer: Medicare Other | Admitting: Family Medicine

## 2014-08-01 ENCOUNTER — Ambulatory Visit (INDEPENDENT_AMBULATORY_CARE_PROVIDER_SITE_OTHER)
Admission: RE | Admit: 2014-08-01 | Discharge: 2014-08-01 | Disposition: A | Discharge status: Home / Self Care | Payer: Medicare Other | Source: Ambulatory Visit | Attending: Family Medicine | Admitting: Family Medicine

## 2014-08-01 VITALS — BP 118/82 | HR 87 | Wt 252.0 lb

## 2014-08-01 DIAGNOSIS — S62306D Unspecified fracture of fifth metacarpal bone, right hand, subsequent encounter for fracture with routine healing: Secondary | ICD-10-CM

## 2014-08-01 DIAGNOSIS — M5412 Radiculopathy, cervical region: Secondary | ICD-10-CM

## 2014-08-01 NOTE — Progress Notes (Signed)
[name] [address] [place], [place] [address] Phone: [phone] Subjective:    CC:   right arm pain follow up hand fracture.   [id number] [name] is a 63 y.o. female coming in for follow up of lr worst pain seems to be her right arm. Patient states that she is having a dull throbbing aching pain that feels like a toothache that is going from her shoulder all the way down to her elbow and sometimes to her hand. Patient had this pain chronically and was sent for an MRI that showed multiple areas of nerve root impingement secondary to degenerative disc disease. Patient has been referred to neurosurgery. Reviewing neurosurgery notes they feel there has not been any extensive changes from her previous MRI in 2010 in only comment on the degenerative disc protrusions. When I review this MRI I do see significant progression. Patient was told that he thinks it is tendinitis secondary to her shoulder. We have done ultrasound on her shoulder previously with no significant findings in only postsurgical changes. Patient states that the pain continues to be so severe that it is stopping her from activities of daily living and is affecting her sleep. Patient states that she is also noticing weakness in the arm. Patient is on gabapentin as well as Effexor with minimal improvement she states.   Patient is also returning for a hand fracture. Patient did have a displaced angulated fracture of the fifth metatarsal. Patient was put in a cast and is here for follow-up. Patient states she has not been wearing the brace religiously. Patient states that she still has a dull aching pain. Patient has noticed that she is starting to get a bump on her hand. States that this bump is painful to palpation. Denies any numbness. Has some mild weakness when using her pinky finger she states.   Past medical history, social, surgical and family history all reviewed in electronic medical  record.   Review of Systems: No headache, visual changes, nausea, vomiting, diarrhea, constipation, dizziness, abdominal pain, skin rash, fevers, chills, night sweats, weight loss, swollen lymph nodes, body aches, joint swelling, muscle aches, chest pain, shortness of breath, mood changes.   Objective Blood pressure 118/82, pulse 87, weight 252 lb (114.306 kg), SpO2 98.00%.  General: No apparent distress alert and oriented x3 mood and affect normal, dressed appropriately.  HEENT: Pupils equal, extraocular movements intact  Respiratory: Patient's speak in full sentences and does not appear short of breath  Cardiovascular: No lower extremity edema, non tender, no erythema  Skin: Warm dry intact with no signs of infection or rash on extremities or on axial skeleton.  Abdomen: Soft nontender  Neuro: Cranial nerves II through XII are intact, neurovascularly intact in all extremities with 2+ DTRs and 2+ pulses.  Lymph: No lymphadenopathy of posterior or anterior cervical chain or axillae bilaterally.  Gait normal with good balance and coordination.  MSK:  Non tender with full range of motion and good stability and symmetric strength and tone of   hip, knee and ankles bilaterally.  HEENT exam on the right side so some significant dorsal swelling of the hand over  fifth metacarpals. Patient continues to be tender over the fifth metatarsal. Patient does have about 15 of angulation still of the fifth metacarpal. Neurovascularly intact distally with trace weakness compared to the contralateral side. Neck: Inspection mild decrease in lordosis with increasing kyphosis at T1 vertebrae. No palpable stepoffs. Positive Spurling's maneuver with radicular  symptoms going down the right arm. Mild limitation with side bending and rotation bilaterally. Grip strength and sensation normal in bilateral hands Patient does have some weakness in the C7-T1 distribution. No sensory change to C4 to T1 Negative Hoffman  sign bilaterally Reflexes normal Shoulder exam shows some mild impingement-type syndromes on right side but no weakness.         Impression and Recommendations:     This case required medical decision making of moderate complexity.

## 2014-08-01 NOTE — Patient Instructions (Signed)
Good to see you Get xray of your hand today Wear the brace on the hand as much as possible!!!! Continue the vitamin D  We will get an epidural of your neck and see if we get you feeling better.  Come back in 3 weeks to make sure you are doing better.

## 2014-08-01 NOTE — Assessment & Plan Note (Signed)
Patient encouraged to wear the brace on a more regular basis. We discussed the icing protocol and patient can use topical anti-inflammatories for pain relief. Discussed with patient the importance of vitamin D for healing. Patient will come back again in 3-4 weeks and we will reevaluate. Patient likely will need x-rays again at that time to further evaluate for healing

## 2014-08-01 NOTE — Assessment & Plan Note (Signed)
Still on exam today I am more concerned than patient is having radicular symptoms more than her shoulder itself. Discussed with patient about different treatment options and she has elected to try an epidural steroid injection in the cervical spine for diagnostic as well as hopefully therapeutic purposes. Discussed continuing the same medication regimen at this time. In the future we may consider increasing patient's Effexor. In addition of this we discussed the possibility of patient coming back in 2-3 weeks. Continuing to have pain we will try another intra-articular injection into her shoulder. Otherwise patient does get response to an epidural we will either consider referring back to neurosurgery for further intervention or for potential second opinion.  Spent greater than 25 minutes with patient face-to-face and had greater than 50% of counseling including as described above in assessment and plan.

## 2014-08-24 ENCOUNTER — Ambulatory Visit: Payer: Medicare Other | Admitting: Family Medicine

## 2014-08-29 ENCOUNTER — Encounter: Payer: Self-pay | Admitting: Family Medicine

## 2014-08-29 ENCOUNTER — Ambulatory Visit (INDEPENDENT_AMBULATORY_CARE_PROVIDER_SITE_OTHER): Payer: Medicare Other | Admitting: Family Medicine

## 2014-08-29 ENCOUNTER — Ambulatory Visit (INDEPENDENT_AMBULATORY_CARE_PROVIDER_SITE_OTHER)
Admission: RE | Admit: 2014-08-29 | Discharge: 2014-08-29 | Disposition: A | Discharge status: Home / Self Care | Payer: Medicare Other | Source: Ambulatory Visit | Attending: Family Medicine | Admitting: Family Medicine

## 2014-08-29 VITALS — BP 116/64 | HR 77 | Ht 66.0 in | Wt 260.0 lb

## 2014-08-29 DIAGNOSIS — S62306D Unspecified fracture of fifth metacarpal bone, right hand, subsequent encounter for fracture with routine healing: Secondary | ICD-10-CM

## 2014-08-29 DIAGNOSIS — M5412 Radiculopathy, cervical region: Secondary | ICD-10-CM

## 2014-08-29 MED ORDER — HYDROCODONE-ACETAMINOPHEN 7.5-325 MG PO TABS: 1.0000 | ORAL_TABLET | Freq: Three times a day (TID) | ORAL | Status: DC | PRN

## 2014-08-29 NOTE — Assessment & Plan Note (Signed)
We'll get repeat x-rays today. I am hoping for some healing. Patient does have good grip strength today so I think she will do relatively well. Patient does have significant different degenerative changes of multiple joints and has had difficulty with healing previously. Patient did have remolding of her cast done today. Discussed continuing to wear this for at least another 2 weeks. Patient and will come back and see me again in 3 weeks for further evaluation.

## 2014-08-29 NOTE — Assessment & Plan Note (Addendum)
I still believe with patient having radicular symptoms from her cervical neck. There is multiple levels of nerve root compression and patient is having mild weakness of the upper extremity. I do feel that further treatment is necessary at this time. I do want an epidural for further evaluation and will hopefully be diagnostic as well as therapeutic. Patient does get some improvement and it is only temporary and I'm going to send her to another surgeon for second opinion. Patient has failed all other conservative therapy at this time. Patient was given a prescription for hydrocodone to take at night. Discussed icing regimen and to continue the home exercises. Patient once again declined another round of formal physical therapy.

## 2014-08-29 NOTE — Progress Notes (Signed)
[name] [address] [place], [place] [address] Phone: [phone] Subjective:    CC:   right arm pain follow up hand fracture.   [id number] [name] is a 63 y.o. female coming in for follow up of lr worst pain seems to be her left arm. Patient states that she is having a dull throbbing aching pain that feels like a toothache that is going from her shoulder all the way down to her elbow and sometimes to her hand. Patient had this pain chronically and was sent for an MRI that showed multiple areas of nerve root impingement secondary to degenerative disc disease. Patient has been referred to neurosurgery. Reviewing neurosurgery notes they feel there has not been any extensive changes from her previous MRI in 2010 in only comment on the degenerative disc protrusions. When I review this MRI I do see significant progression. Patient was sent to neurosurgeon and was told that surgical intervention may not be possible.   Patient is also returning for a hand fracture. Patient did have a displaced angulated fracture of the fifth metatarsal.  Patient has had difficulty with the brace and has not been wearing it on a regular basis. Denies any numbness but notices some mild weakness. Patient has noticed increasing range of motion. Still has significant discomfort when she hits it against certain things. Denies any significant new symptoms. Patient has been fairly noncompliant on the home exercises and patient did not want to go to formal physical therapy.   Past medical history, social, surgical and family history all reviewed in electronic medical record.   Review of Systems: No headache, visual changes, nausea, vomiting, diarrhea, constipation, dizziness, abdominal pain, skin rash, fevers, chills, night sweats, weight loss, swollen lymph nodes, body aches, joint swelling, muscle aches, chest pain, shortness of breath, mood changes.   Objective Blood pressure  116/64, pulse 77, height 5\' 6"  (1.676 m), weight 260 lb (117.935 kg), SpO2 97 %.  General: No apparent distress alert and oriented x3 mood and affect normal, dressed appropriately.  HEENT: Pupils equal, extraocular movements intact  Respiratory: Patient's speak in full sentences and does not appear short of breath  Cardiovascular: No lower extremity edema, non tender, no erythema  Skin: Warm dry intact with no signs of infection or rash on extremities or on axial skeleton.  Abdomen: Soft nontender  Neuro: Cranial nerves II through XII are intact, neurovascularly intact in all extremities with 2+ DTRs and 2+ pulses.  Lymph: No lymphadenopathy of posterior or anterior cervical chain or axillae bilaterally.  Gait normal with good balance and coordination.  MSK:  Non tender with full range of motion and good stability and symmetric strength and tone of   hip, knee and ankles bilaterally.  HEENT exam on the right side so some significant dorsal swelling of the hand over  fifth metacarpals. Patient continues to be tender over the fifth metatarsal but slowly improving. Patient does have about 15 of angulation still of the fifth metacarpal. Callus formation noted Neurovascularly intact distally with trace weakness compared to the contralateral side. Neck: Inspection mild decrease in lordosis with increasing kyphosis at T1 vertebrae. No palpable stepoffs. Positive Spurling's maneuver with radicular symptoms going down the right arm. No change from previous exam Mild limitation with side bending and rotation bilaterally. Grip strength and sensation normal in bilateral hands Patient does have some weakness in the C7-T1 distribution. No sensory change to C4 to T1 Negative Hoffman sign bilaterally Reflexes normal  Shoulder exam shows some mild impingement-type syndromes on right side but no weakness.         Impression and Recommendations:     This case required medical decision making of  moderate complexity.

## 2014-08-29 NOTE — Patient Instructions (Addendum)
Good to see you We need to get another xray of the hand to see if there is any healing.  Call [facility] and we will get injection.  [phone] Continue the exercises we have done.  Try to wear the brace whenever you can for another 2 weeks.  See me again for the hand in 3 weeks. For the neck you can call me after epidural and if better we will get you in the hands of someone who will do something.

## 2014-09-05 ENCOUNTER — Other Ambulatory Visit: Payer: Self-pay | Admitting: Endocrinology

## 2014-09-06 ENCOUNTER — Ambulatory Visit: Payer: Medicare Other | Admitting: Internal Medicine

## 2014-09-06 ENCOUNTER — Ambulatory Visit
Admission: RE | Admit: 2014-09-06 | Discharge: 2014-09-06 | Disposition: A | Discharge status: Home / Self Care | Payer: Medicare Other | Source: Ambulatory Visit | Attending: Family Medicine | Admitting: Family Medicine

## 2014-09-06 DIAGNOSIS — M5412 Radiculopathy, cervical region: Secondary | ICD-10-CM

## 2014-09-06 MED ORDER — IOHEXOL 300 MG/ML  SOLN
1.0000 mL | Freq: Once | INTRAMUSCULAR | Status: AC | PRN
  Administered 2014-09-06: 1 mL via EPIDURAL

## 2014-09-06 MED ORDER — TRIAMCINOLONE ACETONIDE 40 MG/ML IJ SUSP (RADIOLOGY)
60.0000 mg | Freq: Once | INTRAMUSCULAR | Status: AC
  Administered 2014-09-06: 60 mg via EPIDURAL

## 2014-09-06 NOTE — Discharge Instructions (Signed)

## 2014-09-13 ENCOUNTER — Other Ambulatory Visit: Payer: Self-pay | Admitting: Gastroenterology

## 2014-09-15 ENCOUNTER — Other Ambulatory Visit: Payer: Self-pay | Admitting: Gastroenterology

## 2014-09-15 MED ORDER — SUCRALFATE 1 GM/10ML PO SUSP: ORAL | Status: DC

## 2014-09-15 NOTE — Telephone Encounter (Signed)
Left message to call back to confirm pharmacy to send Carafate too.  Spoke to patient on her mobile # , confirmed to send carafate to [facility] at [facility].

## 2014-09-16 ENCOUNTER — Telehealth: Payer: Self-pay | Admitting: Gastroenterology

## 2014-09-16 NOTE — Telephone Encounter (Signed)
I have okayed tablet form as of earlier this morning. Left message advising patient.

## 2014-09-26 ENCOUNTER — Encounter: Payer: Self-pay | Admitting: Family Medicine

## 2014-09-26 ENCOUNTER — Other Ambulatory Visit (INDEPENDENT_AMBULATORY_CARE_PROVIDER_SITE_OTHER): Payer: Medicare Other

## 2014-09-26 ENCOUNTER — Ambulatory Visit (INDEPENDENT_AMBULATORY_CARE_PROVIDER_SITE_OTHER): Payer: Medicare Other | Admitting: Family Medicine

## 2014-09-26 VITALS — BP 122/72 | HR 84 | Ht 66.0 in | Wt 250.0 lb

## 2014-09-26 DIAGNOSIS — M5412 Radiculopathy, cervical region: Secondary | ICD-10-CM

## 2014-09-26 DIAGNOSIS — E890 Postprocedural hypothyroidism: Secondary | ICD-10-CM

## 2014-09-26 DIAGNOSIS — E1165 Type 2 diabetes mellitus with hyperglycemia: Secondary | ICD-10-CM

## 2014-09-26 DIAGNOSIS — S62306D Unspecified fracture of fifth metacarpal bone, right hand, subsequent encounter for fracture with routine healing: Secondary | ICD-10-CM

## 2014-09-26 DIAGNOSIS — IMO0002 Reserved for concepts with insufficient information to code with codable children: Secondary | ICD-10-CM

## 2014-09-26 LAB — BASIC METABOLIC PANEL
BUN: 22 mg/dL (ref 6–23)
CO2: 24 mEq/L (ref 19–32)
Calcium: 9.4 mg/dL (ref 8.4–10.5)
Chloride: 106 mEq/L (ref 96–112)
Creatinine, Ser: 1.2 mg/dL (ref 0.4–1.2)
GFR: 56.96 mL/min — ABNORMAL LOW (ref >60.00)
Glucose, Bld: 95 mg/dL (ref 70–99)
Potassium: 4.3 mEq/L (ref 3.5–5.1)
Sodium: 137 mEq/L (ref 135–145)

## 2014-09-26 LAB — HEMOGLOBIN A1C: Hgb A1c MFr Bld: 6.4 % (ref 4.6–6.5)

## 2014-09-26 NOTE — Progress Notes (Signed)
[name] [address] [place], [place] [address] Phone: [phone] Subjective:    CC:   right arm pain follow up hand fracture.   [id number] [name] is a 63 y.o. female coming in for follow up of lr worst pain seems to be her left arm. Patient states that she is having a dull throbbing aching pain that feels like a toothache that is going from her shoulder all the way down to her elbow and sometimes to her hand. Patient had this pain chronically and was sent for an MRI that showed multiple areas of nerve root impingement secondary to degenerative disc disease. Patient has been referred to neurosurgery. Reviewing neurosurgery notes they feel there has not been any extensive changes from her previous MRI in 2010 and would not make changes. . They decided no surgical intervention is possible. Patient was sent for an epidural steroid injection and states the patient is approximately 65-75% better. Patient states that the dull throbbing ache that is constant has decreased. Patient states though that continues to have some pain with certain range of motion. Not going down the arm as much.    Patient is also returning for a hand fracture. Patient did have a displaced angulated fracture of the fifth metatarsal.  Patient has not been wearing the brace. Patient elected to try conservative therapy and we did not see significant healing. Patient states though that it is improving. Patient has noticed some mild increase in strength and denies any new findings. Patient states that it is starting to look better visually as well. No new symptoms.   Past medical history, social, surgical and family history all reviewed in electronic medical record.   Review of Systems: No headache, visual changes, nausea, vomiting, diarrhea, constipation, dizziness, abdominal pain, skin rash, fevers, chills, night sweats, weight loss, swollen lymph nodes, body aches, joint swelling,  muscle aches, chest pain, shortness of breath, mood changes.   Objective Blood pressure 122/72, pulse 84, height 5\' 6"  (1.676 m), weight 250 lb (113.399 kg), SpO2 99 %.  General: No apparent distress alert and oriented x3 mood and affect normal, dressed appropriately.  HEENT: Pupils equal, extraocular movements intact  Respiratory: Patient's speak in full sentences and does not appear short of breath  Cardiovascular: No lower extremity edema, non tender, no erythema  Skin: Warm dry intact with no signs of infection or rash on extremities or on axial skeleton.  Abdomen: Soft nontender  Neuro: Cranial nerves II through XII are intact, neurovascularly intact in all extremities with 2+ DTRs and 2+ pulses.  Lymph: No lymphadenopathy of posterior or anterior cervical chain or axillae bilaterally.  Gait normal with good balance and coordination.  MSK:  Non tender with full range of motion and good stability and symmetric strength and tone of   hip, knee and ankles bilaterally.  Hand exam on the right side so some significant dorsal swelling of the hand over  fifth metacarpals. Patient continues to be tender over the fifth metatarsal but slowly improving. Patient does have about 12 of angulation still of the fifth metacarpal. Good grip strength. All improved from previous exam.  Neck: Inspection mild decrease in lordosis with increasing kyphosis at T1 vertebrae. No palpable stepoffs. Still painful with Spurling's but significant decrease in radicular symptoms.  Mild limitation with side bending and rotation bilaterally. Grip strength and sensation normal in bilateral hands Patient does have some weakness in the C7-T1 distribution. No sensory change to C4 to  T1 Negative Hoffman sign bilaterally Reflexes normal         Impression and Recommendations:     This case required medical decision making of moderate complexity.

## 2014-09-26 NOTE — Assessment & Plan Note (Signed)
Patient is improving slowly. Patient did respond well to an epidural steroid injection and we discussed that she can have this again if needed. Patient though would like to avoid because it was painful. Patient will continue with all her other medications and clearing some of the pain medications. Patient denies any radiation into the arm today. This is making me very optimistic. Patient will follow-up again in 6 weeks for further evaluation.

## 2014-09-26 NOTE — Assessment & Plan Note (Signed)
Patient is healing. Patient will likely have some angulation at baseline. Callus formation is noted. I am hoping the patient will continue to heal. We will hold on repeat x-rays for now. Back again in 4 weeks for further evaluation. Patient has been fairly noncompliant with the bracing.

## 2014-09-26 NOTE — Patient Instructions (Signed)
Good to see you! Ice is your best friend I am glad your neck is doing better The hand is looking good!  Will take another month or so.  If the neck acts up we can try another epidural but hopefully that will not be needed.  See me again in 4-6 weeks.

## 2014-09-27 LAB — TSH: TSH: 6.94 u[IU]/mL — ABNORMAL HIGH (ref 0.35–4.50)

## 2014-09-28 ENCOUNTER — Other Ambulatory Visit: Payer: Self-pay | Admitting: *Deleted

## 2014-09-28 ENCOUNTER — Ambulatory Visit (INDEPENDENT_AMBULATORY_CARE_PROVIDER_SITE_OTHER): Payer: Medicare Other | Admitting: Endocrinology

## 2014-09-28 ENCOUNTER — Encounter: Payer: Self-pay | Admitting: Endocrinology

## 2014-09-28 VITALS — BP 125/79 | HR 97 | Temp 98.2°F | Resp 16 | Ht 66.0 in | Wt 248.4 lb

## 2014-09-28 DIAGNOSIS — N289 Disorder of kidney and ureter, unspecified: Secondary | ICD-10-CM

## 2014-09-28 DIAGNOSIS — E890 Postprocedural hypothyroidism: Secondary | ICD-10-CM

## 2014-09-28 DIAGNOSIS — IMO0002 Reserved for concepts with insufficient information to code with codable children: Secondary | ICD-10-CM

## 2014-09-28 DIAGNOSIS — E1165 Type 2 diabetes mellitus with hyperglycemia: Secondary | ICD-10-CM

## 2014-09-28 DIAGNOSIS — E038 Other specified hypothyroidism: Secondary | ICD-10-CM

## 2014-09-28 LAB — FRUCTOSAMINE: Fructosamine: 308 umol/L — ABNORMAL HIGH (ref 190–270)

## 2014-09-28 MED ORDER — GLUCOSE BLOOD VI STRP: ORAL_STRIP | Status: DC

## 2014-09-28 MED ORDER — LEVOTHYROXINE SODIUM 75 MCG PO TABS: 75.0000 ug | ORAL_TABLET | Freq: Every day | ORAL | Status: DC

## 2014-09-28 NOTE — Progress Notes (Signed)
[name] is an 63 y.o. female.    Reason for Appointment: Diabetes follow-up   History of Present Illness   Diagnosis: Type 2 DIABETES MELITUS, date of diagnosis: 2000  Previous history: She had initially been treated with metformin and Amaryl and subsequently given Victoza which helped overall control and weight loss. In 9/13 because of significant hyperglycemia she was started on basal bolus insulin also. Had required relatively small doses of insulin. Her blood sugar control has been excellent with A1c between 5.3-6.0 although this may be relatively higher than expected. Tends to have relatively higher readings after supper In 6/15 she was told to resume her Victoza and stop her Lantus since she had been gaining weight with Lantus and NovoLog regimen.   Recent history:  She has not been seen in follow-up for several months now. She continues to take Victoza that was started along with NovoLog insulin However her blood sugars have been quite erratic and she thinks they had been higher 2-3 weeks ago because of getting epidural steroid in her neck She is checking her blood sugar somewhat erratically and her mealtimes are also quite erratic Her blood sugars have been relatively high until today when it was 144 fasting She thinks she is still eating 1 meal a day in the early evening and no other meals or large snacks even though her husband eats 2 meals a day She previously had tolerated 1.2 mg Victoza but not she said that she is having persistent nausea of unclear etiology and occasional vomiting However has not done any readings after meals and is taking NovoLog 8 units before supper even though she was advised at least 10 unitsand probably does not change the dose much urine with high readings A1c is slightly higher than usual but fructosamine high, A1c is usually not accurate in her case    Oral hypoglycemic drugs: Amaryl  1 mg and metformin 1 g   Side effects from  medications:  none INSULIN:  8 Novolog acs Monitors blood glucose: Once a day.    Glucometer:  Accu-Chek compact.          Blood Glucose readings from download  PRE-MEAL 11 AM-2 PM 10-11 PM overnight  Overall  Glucose range: 123-255 163-286 128-337    Mean/median: 166    176    Hypoglycemia: None    Meals: 1-3 meals per day. Supper 5 pm          Physical activity: exercise: Limited, has had fatigue, back and leg pain.           Dietician visit: Most recent: [id number]           Complications: are: None  Wt Readings from Last 3 Encounters:  09/28/14 248 lb 6.4 oz (112.674 kg)  09/26/14 250 lb (113.399 kg)  08/29/14 260 lb (117.935 kg)    LABS:  Lab Results  Component Value Date   HGBA1C 6.4 09/26/2014   HGBA1C 5.7 04/06/2014   HGBA1C 5.9 02/23/2014   Lab Results  Component Value Date   MICROALBUR 0.4 02/23/2014   LDLCALC 75 02/23/2014   CREATININE 1.2 09/26/2014    Lab on 09/26/2014  Component Date Value Ref Range Status  . TSH 09/26/2014 6.94* 0.35 - 4.50 uIU/mL Final  . Hgb A1c MFr Bld 09/26/2014 6.4  4.6 - 6.5 % Final   Glycemic Control Guidelines for People with Diabetes:Non Diabetic:  <6%Goal of Therapy: <7%Additional Action Suggested:  >8%   . Fructosamine 09/26/2014  308* 190 - 270 umol/L Final  . Sodium 09/26/2014 137  135 - 145 mEq/L Final  . Potassium 09/26/2014 4.3  3.5 - 5.1 mEq/L Final  . Chloride 09/26/2014 106  96 - 112 mEq/L Final  . CO2 09/26/2014 24  19 - 32 mEq/L Final  . Glucose, Bld 09/26/2014 95  70 - 99 mg/dL Final  . BUN 09/26/2014 22  6 - 23 mg/dL Final  . Creatinine, Ser 09/26/2014 1.2  0.4 - 1.2 mg/dL Final  . Calcium 09/26/2014 9.4  8.4 - 10.5 mg/dL Final  . GFR 09/26/2014 56.96* >60.00 mL/min Final      Medication List       This list is accurate as of: 09/28/14  1:29 PM.  Always use your most recent med list.               AMBULATORY NON FORMULARY MEDICATION  Domperidone 10 mg    Take one tablet by mouth twice a day before meals      amLODipine-valsartan 10-320 MG per tablet  Commonly known as:  EXFORGE  Take 1 tablet by mouth daily.     aspirin 81 MG EC tablet  Take 81 mg by mouth daily.     ciprofloxacin 500 MG tablet  Commonly known as:  CIPRO  Take 1 tablet (500 mg total) by mouth 2 (two) times daily.     cyclobenzaprine 5 MG tablet  Commonly known as:  FLEXERIL  Take 1 tablet (5 mg total) by mouth 3 (three) times daily as needed for muscle spasms.     Diclofenac Sodium 2 % Soln  Commonly known as:  PENNSAID  Place 2 application onto the skin 2 (two) times daily.     ELMIRON 100 MG capsule  Generic drug:  pentosan polysulfate  Take 100 mg by mouth 2 (two) times daily.     estradiol 1 MG tablet  Commonly known as:  ESTRACE  TAKE 1 TABLET (1 MG TOTAL) BY MOUTH DAILY.     furosemide 40 MG tablet  Commonly known as:  LASIX  TAKE 1 TABLET (40 MG TOTAL) BY MOUTH 2 (TWO) TIMES DAILY.     gabapentin 300 MG capsule  Commonly known as:  NEURONTIN  Take 300 mg by mouth at bedtime. Takes 3 tablets at night     gabapentin 600 MG tablet  Commonly known as:  NEURONTIN     glimepiride 1 MG tablet  Commonly known as:  AMARYL  TAKE 1 TABLET WITH SUPPER ONCE A DAY BY MOUTH PER DR. Dwyane Dee     HYDROcodone-acetaminophen 7.5-325 MG per tablet  Commonly known as:  NORCO  Take 1 tablet by mouth every 8 (eight) hours as needed for moderate pain (cough).     isosorbide dinitrate 10 MG tablet  Commonly known as:  ISORDIL  TAKE ONE TABLET BY MOUTH DAILY     levothyroxine 75 MCG tablet  Commonly known as:  SYNTHROID, LEVOTHROID  Take 1 tablet (75 mcg total) by mouth daily before breakfast.     LINZESS 290 MCG Caps capsule  Generic drug:  Linaclotide  Take 290 mcg by mouth every other day.     metFORMIN 500 MG 24 hr tablet  Commonly known as:  GLUCOPHAGE-XR  TAKE 1 TABLET BY MOUTH TWICE A DAY     metoprolol 50 MG tablet  Commonly known as:  LOPRESSOR  Take one and one-half tablets twice a day      NOVOLOG FLEXPEN 100 UNIT/ML FlexPen  Generic drug:  insulin aspart  Inject 10 Units into the skin 3 (three) times daily with meals.     ondansetron 4 MG tablet  Commonly known as:  ZOFRAN  Take 1 tab every 6 hours as needed for nausea.     oxyCODONE 5 MG immediate release tablet  Commonly known as:  Oxy IR/ROXICODONE  1-2 tabs by mouth every 4-6 hrs for pain, with maximum 6 tabs per day     oxyCODONE-acetaminophen 5-325 MG per tablet  Commonly known as:  PERCOCET/ROXICET  Take 2 tablets by mouth every 4 (four) hours as needed for severe pain.     phenazopyridine 95 MG tablet  Commonly known as:  PYRIDIUM  Take 1 tablet (95 mg total) by mouth 3 (three) times daily as needed for pain.     simvastatin 20 MG tablet  Commonly known as:  ZOCOR  TAKE 1 TABLET BY MOUTH DAILY     simvastatin 20 MG tablet  Commonly known as:  ZOCOR  TAKE 1 TABLET BY MOUTH DAILY     sucralfate 1 GM/10ML suspension  Commonly known as:  CARAFATE  Take 2 teaspoons three times daily before meals.     traMADol 50 MG tablet  Commonly known as:  ULTRAM  TAKE 1 TABLET BY MOUTH EVERY 6 HOURS AS NEEDED FOR MODERATE PAIN     trimethoprim 100 MG tablet  Commonly known as:  TRIMPEX     venlafaxine 37.5 MG tablet  Commonly known as:  EFFEXOR  TAKE 2 TABLETS (75 MG TOTAL) BY MOUTH 2 (TWO) TIMES DAILY WITH A MEAL.     VICTOZA 18 MG/3ML Sopn  Generic drug:  Liraglutide  Inject 1.2 mg into the skin daily. Inject once daily at the same time        Allergies:  Allergies  Allergen Reactions  . Hydrocodone Other (See Comments)    tachycardia  . Ciprofloxacin Other (See Comments)    dizzy  . Penicillins Hives    Past Medical History  Diagnosis Date  . COMMON MIGRAINE   . DIVERTICULOSIS-COLON   . CORONARY ARTERY DISEASE   . Gastroparesis   . INSOMNIA-SLEEP DISORDER-UNSPEC   . Iron deficiency anemia   . Anxiety   . DEPRESSION   . Diabetes mellitus, type II   . GERD (gastroesophageal reflux disease)    . Hyperlipidemia   . Hypertension   . Gastric ulcer 04/2008  . CERVICAL RADICULOPATHY, LEFT   . Hiatal hernia   . Hypothyroidism     Past Surgical History  Procedure Laterality Date  . Left knee replacement    . Rigth knee replacement with revision  04/2008    Dr. Berenice Primas  . Gastric wedge resection lipoma  11/2007    x2 with laparotomy and gastrostomy  . Rotator cuff repair  01/2009  . Cholecystectomy  06/2009  . S/p bladder surgury  09/2009    Dr. Terance Hart  . Abdominal hysterectomy      Family History  Problem Relation Age of Onset  . Diabetes Sister     x 3  . Heart disease Sister     x2  . Diabetes Brother     x3  . Heart disease Brother     x2  . Coronary artery disease Other     female 1st degree  . Hypertension Other   . Colon cancer Neg Hx     Social History:  reports that she has quit smoking. She has never used smokeless tobacco. She  reports that she does not drink alcohol or use illicit drugs.  Review of Systems:  Hypertension: Her blood pressure is treated with metoprolol and Exforge Not monitoring blood pressure at home She tends to have relatively fast heart rate of unclear etiology, possibly autonomic neuropathy  Mild CKD: Her creatinine is improved, not taking meloxicam now   Lab Results  Component Value Date   CREATININE 1.2 09/26/2014   BUN 22 09/26/2014   NA 137 09/26/2014   K 4.3 09/26/2014   CL 106 09/26/2014   CO2 24 09/26/2014     Lipids: Usually well controlled with simvastatin  Lab Results  Component Value Date   CHOL 152 02/23/2014   HDL 56.50 02/23/2014   LDLCALC 75 02/23/2014   LDLDIRECT 156.6 01/07/2011   TRIG 105.0 02/23/2014   CHOLHDL 3 02/23/2014     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   No pedal edema present  ASSESSMENT/ PLAN:   Diabetes type 2:  She is back in follow-up after several months Blood glucose control is overall worse recently and this is reflected in her high fructosamine level of 308, previously 250 She has very erratic blood sugar monitoring and mealtimes Blood sugars are higher because of getting steroid injections Also she is having difficulty with persistent nausea and not clear of the relationship with Victoza which she has taken previously without side effects She is still not very active and has difficulty losing weight  For now will try to get her off Victoza temporarily to see if nausea improves and start back on basal insulin with Lantus Discussed how Lantus works and titration based on fasting blood sugar trend over 3 days She will need at least 10 units of NovoLog at suppertime and more if she continues to have postprandial hyperglycemia.  Discussed blood sugar targets at various times She will try to have balanced meals Will need short-term follow-up  Hypothyroidism: TSH higher because of recently being out of her Synthroid, since the level is only slightly higher will continue the same dose for now and recheck in 2 months  HYPERTENSION: Blood pressure is controlled  Renal insufficiency: Creatinine is better at 1.2, previously 1.4.  This may be from stopping meloxicam  Counseling time over 50% of today's 25 minute visit  Patient Instructions  Stop Victoza for a week to see if nausea better  Please check blood sugars at least half the time about 2 hours after any meal and  3-4 times per week on waking up. Please bring blood sugar monitor to each visit  If am sugar > 150 then start 6 lantus then go up to 8-10 if am sugar still over 130  Novolog 10-12 before meal      [name] 09/28/2014, 1:29 PM

## 2014-09-28 NOTE — Patient Instructions (Addendum)
Stop Victoza for a week to see if nausea better  Please check blood sugars at least half the time about 2 hours after any meal and 3-4 times per week on waking up. Please bring blood sugar monitor to each visit  If am sugar > 150 then start 6 lantus then go up to 8-10 if am sugar still over 130  Novolog 10-12 before meal

## 2014-10-14 ENCOUNTER — Other Ambulatory Visit: Payer: Self-pay | Admitting: Endocrinology

## 2014-10-19 ENCOUNTER — Encounter: Payer: Self-pay | Admitting: Gastroenterology

## 2014-10-19 ENCOUNTER — Ambulatory Visit (INDEPENDENT_AMBULATORY_CARE_PROVIDER_SITE_OTHER): Payer: Medicare Other | Admitting: Gastroenterology

## 2014-10-19 VITALS — BP 110/62 | HR 100 | Ht 65.25 in | Wt 259.0 lb

## 2014-10-19 DIAGNOSIS — K5909 Other constipation: Secondary | ICD-10-CM

## 2014-10-19 DIAGNOSIS — K219 Gastro-esophageal reflux disease without esophagitis: Secondary | ICD-10-CM

## 2014-10-19 MED ORDER — SUCRALFATE 1 GM/10ML PO SUSP: ORAL | Status: DC

## 2014-10-19 MED ORDER — OMEPRAZOLE 20 MG PO CPDR: 20.0000 mg | DELAYED_RELEASE_CAPSULE | Freq: Every day | ORAL | Status: DC

## 2014-10-19 MED ORDER — LINACLOTIDE 145 MCG PO CAPS: 145.0000 ug | ORAL_CAPSULE | ORAL | Status: DC

## 2014-10-19 NOTE — Patient Instructions (Addendum)
We have sent the following medications to your pharmacy for you to pick up at your convenience: omeprazole, Linzess, and Carafate suspension. You can take your Linzess one second to third day if needed.  Thank you for choosing me and [facility].  Pricilla Riffle. Dagoberto Ligas., MD., Marval Regal

## 2014-10-19 NOTE — Progress Notes (Signed)
    History of Present Illness: This is a 64 year old female accompanied by her husband. She has been cared for at [facility] by Dr. Virgia Land since I last saw her in 2013. Repeat GES at [facility] was apparently normal. She has been maintained on Linzess 290 mcg every 3 days. Her bowels move with this regimen but they are loose and uncontrolled. She has intermittent GERD symptoms.   Current Medications, Allergies, Past Medical History, Past Surgical History, Family History and Social History were reviewed in [employer] record.  Physical Exam: General: Well developed , well nourished, obese, no acute distress Head: Normocephalic and atraumatic Eyes:  sclerae anicteric, EOMI Ears: Normal auditory acuity Mouth: No deformity or lesions Lungs: Clear throughout to auscultation Heart: Regular rate and rhythm; no murmurs, rubs or bruits Abdomen: Soft, non tender and non distended. No masses, hepatosplenomegaly or hernias noted. Normal Bowel sounds Musculoskeletal: Symmetrical with no gross deformities  Pulses:  Normal pulses noted Extremities: No clubbing, cyanosis, edema or deformities noted Neurological: Alert oriented x 4, grossly nonfocal Psychological:  Alert and cooperative. Normal mood and affect  Assessment and Recommendations:  1. Chronic constipation. Change Linzess to 145 mcg qod or every 3rd day. Call office if this regimen is not effective. REV in 1 year.  2. GERD. Omeprazole 20 mg daily and Carafate suspension tid as needed.

## 2014-11-01 ENCOUNTER — Other Ambulatory Visit: Payer: Self-pay | Admitting: *Deleted

## 2014-11-01 MED ORDER — METFORMIN HCL ER 500 MG PO TB24: 500.0000 mg | ORAL_TABLET | Freq: Two times a day (BID) | ORAL | Status: DC

## 2014-11-05 ENCOUNTER — Encounter: Payer: Self-pay | Admitting: Family Medicine

## 2014-11-05 ENCOUNTER — Ambulatory Visit (INDEPENDENT_AMBULATORY_CARE_PROVIDER_SITE_OTHER): Payer: Medicare Other | Admitting: Family Medicine

## 2014-11-05 VITALS — BP 140/80 | HR 80 | Temp 98.3°F | Resp 15 | Wt 243.0 lb

## 2014-11-05 DIAGNOSIS — R5383 Other fatigue: Secondary | ICD-10-CM

## 2014-11-05 DIAGNOSIS — E860 Dehydration: Secondary | ICD-10-CM

## 2014-11-05 DIAGNOSIS — R5381 Other malaise: Secondary | ICD-10-CM | POA: Insufficient documentation

## 2014-11-05 DIAGNOSIS — N179 Acute kidney failure, unspecified: Secondary | ICD-10-CM

## 2014-11-05 LAB — CBC WITH DIFFERENTIAL/PLATELET
Basophils Absolute: 0 10*3/uL (ref 0.0–0.1)
Basophils Relative: 0 % (ref 0–1)
Eosinophils Absolute: 0.1 10*3/uL (ref 0.0–0.7)
Eosinophils Relative: 1 % (ref 0–5)
HCT: 37 % (ref 36.0–46.0)
Hemoglobin: 11.6 g/dL — ABNORMAL LOW (ref 12.0–15.0)
Lymphocytes Relative: 24 % (ref 12–46)
Lymphs Abs: 2.3 10*3/uL (ref 0.7–4.0)
MCH: 31.4 pg (ref 26.0–34.0)
MCHC: 31.4 g/dL (ref 30.0–36.0)
MCV: 100.3 fL — ABNORMAL HIGH (ref 78.0–100.0)
Monocytes Absolute: 0.3 10*3/uL (ref 0.1–1.0)
Monocytes Relative: 3 % (ref 3–12)
Neutro Abs: 7 10*3/uL (ref 1.7–7.7)
Neutrophils Relative %: 72 % (ref 43–77)
Platelets: 260 10*3/uL (ref 150–400)
RBC: 3.69 MIL/uL — ABNORMAL LOW (ref 3.87–5.11)
RDW: 12.5 % (ref 11.5–15.5)
WBC: 9.7 10*3/uL (ref 4.0–10.5)

## 2014-11-05 LAB — POCT URINALYSIS DIPSTICK
Bilirubin, UA: NEGATIVE
Blood, UA: NEGATIVE
Glucose, UA: NEGATIVE
Ketones, UA: POSITIVE
Leukocytes, UA: NEGATIVE
Nitrite, UA: NEGATIVE
Spec Grav, UA: >=1.03
Urobilinogen, UA: NEGATIVE
pH, UA: 5

## 2014-11-05 LAB — COMPREHENSIVE METABOLIC PANEL
ALT: 12 U/L (ref 0–35)
AST: 15 U/L (ref 0–37)
Albumin: 4.1 g/dL (ref 3.5–5.2)
Alkaline Phosphatase: 60 U/L (ref 39–117)
BUN: 25 mg/dL — ABNORMAL HIGH (ref 6–23)
CO2: 23 mEq/L (ref 19–32)
Calcium: 9.9 mg/dL (ref 8.4–10.5)
Chloride: 107 mEq/L (ref 96–112)
Creat: 1.67 mg/dL — ABNORMAL HIGH (ref 0.50–1.10)
Glucose, Bld: 163 mg/dL — ABNORMAL HIGH (ref 70–99)
Potassium: 4.4 mEq/L (ref 3.5–5.3)
Sodium: 138 mEq/L (ref 135–145)
Total Bilirubin: 0.8 mg/dL (ref 0.3–1.2)
Total Protein: 7.5 g/dL (ref 6.0–8.3)

## 2014-11-05 LAB — LIPASE: Lipase: 47 U/L (ref 11–59)

## 2014-11-05 LAB — POCT INFLUENZA A/B
Influenza A, POC: NEGATIVE
Influenza B, POC: NEGATIVE

## 2014-11-05 NOTE — Progress Notes (Signed)
BP 140/80 mmHg  Pulse 80  Temp(Src) 98.3 F (36.8 C)  Resp 15  Wt 243 lb (110.224 kg)   CC: malaise  Subjective:    Patient ID: [name], female    DOB: 1951/04/09, 64 y.o.   MRN: [id number]  HPI: [name] is a 64 y.o. female presenting on 11/05/2014 for weak and body aches  1 wk h/o generalized weakness and malaise after IC flare this week evaluated by Dr Matilde Sprang - no urinary infection by urinary culture done this week. Endorses body aches, chills initially feeling feverish but did not document fever. + ST and mild rhinorrhea, abd pain with urinary sxs (attributed to IC). Persistent myalgias/body aches. Currently with throbbing dull frontal headache. Does not feel like typical migraines.   No cough, congestion, PNRdainage, ear or tooth pain, diarrhea or constipation. No weight changes. Decreased appetite over last week. Last BM was 6d ago. Decreased amt voiding noted as well.   This week started receiving instillations into bladder wth a numbing medicine. Since treatment not voiding very well.   Did receive flu shot this year.  No sick contacts at home.  No smokers at home.   Tried tylenol which may have helped some with aches.   Lab Results  Component Value Date   HGBA1C 6.4 09/26/2014   Lab Results  Component Value Date   CREATININE 1.2 09/26/2014   Relevant past medical, surgical, family and social history reviewed and updated as indicated. Interim medical history since our last visit reviewed. Allergies and medications reviewed and updated. Current Outpatient Prescriptions on File Prior to Visit  Medication Sig  . AMBULATORY NON FORMULARY MEDICATION Domperidone 10 mg    Take one tablet by mouth twice a day before meals  . aspirin 81 MG EC tablet Take 81 mg by mouth daily.    . Diclofenac Sodium (PENNSAID) 2 % SOLN Place 2 application onto the skin 2 (two) times daily.  Marland Kitchen ELMIRON 100 MG capsule Take 100 mg by mouth 2 (two) times daily.   Marland Kitchen estradiol  (ESTRACE) 1 MG tablet TAKE 1 TABLET (1 MG TOTAL) BY MOUTH DAILY.  Marland Kitchen gabapentin (NEURONTIN) 300 MG capsule Take 300 mg by mouth at bedtime. Take one with a 600 mg at night  . gabapentin (NEURONTIN) 600 MG tablet Take 600 mg by mouth 2 (two) times daily.   Marland Kitchen glimepiride (AMARYL) 1 MG tablet TAKE 1 TABLET BY MOUTH WITH SUPPER ONCE A DAY PER DR. Dwyane Dee  . glucose blood (ACCU-CHEK COMPACT PLUS) test strip Use as instructed to check blood sugar 2 times per day dx code E11.9  . HYDROcodone-acetaminophen (NORCO) 7.5-325 MG per tablet Take 1 tablet by mouth every 8 (eight) hours as needed for moderate pain. (cough)  . insulin aspart (NOVOLOG FLEXPEN) 100 UNIT/ML FlexPen Inject 10 Units into the skin 3 (three) times daily with meals.  . isosorbide dinitrate (ISORDIL) 10 MG tablet TAKE ONE TABLET BY MOUTH DAILY  . levothyroxine (SYNTHROID, LEVOTHROID) 75 MCG tablet Take 1 tablet (75 mcg total) by mouth daily before breakfast.  . Linaclotide (LINZESS) 145 MCG CAPS capsule Take 1 capsule (145 mcg total) by mouth every other day.  . metFORMIN (GLUCOPHAGE-XR) 500 MG 24 hr tablet Take 1 tablet (500 mg total) by mouth 2 (two) times daily.  . metoprolol (LOPRESSOR) 50 MG tablet Take one and one-half tablets twice a day  . omeprazole (PRILOSEC) 20 MG capsule Take 1 capsule (20 mg total) by mouth daily.  Marland Kitchen  ondansetron (ZOFRAN) 4 MG tablet Take 1 tab every 6 hours as needed for nausea.  . phenazopyridine (PYRIDIUM) 95 MG tablet Take 1 tablet (95 mg total) by mouth 3 (three) times daily as needed for pain.  . simvastatin (ZOCOR) 20 MG tablet TAKE 1 TABLET BY MOUTH DAILY  . sucralfate (CARAFATE) 1 GM/10ML suspension Take 2 teaspoons three times daily before meals.  . traMADol (ULTRAM) 50 MG tablet TAKE 1 TABLET BY MOUTH EVERY 6 HOURS AS NEEDED FOR MODERATE PAIN  . trimethoprim (TRIMPEX) 100 MG tablet Take 100 mg by mouth daily.   Marland Kitchen venlafaxine (EFFEXOR) 37.5 MG tablet TAKE 2 TABLETS (75 MG TOTAL) BY MOUTH 2 (TWO) TIMES  DAILY WITH A MEAL.  Marland Kitchen VICTOZA 18 MG/3ML SOPN Inject 1.2 mg into the skin daily. Inject once daily at the same time   No current facility-administered medications on file prior to visit.    Review of Systems Per HPI unless specifically indicated above     Objective:    BP 140/80 mmHg  Pulse 80  Temp(Src) 98.3 F (36.8 C)  Resp 15  Wt 243 lb (110.224 kg)  Wt Readings from Last 3 Encounters:  11/05/14 243 lb (110.224 kg)  10/19/14 259 lb (117.482 kg)  09/28/14 248 lb 6.4 oz (112.674 kg)    Physical Exam  Constitutional: She appears well-developed and well-nourished. No distress.  Obese Tired but nontoxic  HENT:  Head: Normocephalic and atraumatic.  Right Ear: Hearing, tympanic membrane, external ear and ear canal normal.  Left Ear: Hearing, tympanic membrane, external ear and ear canal normal.  Nose: Mucosal edema (with pallor) present. No rhinorrhea. Right sinus exhibits frontal sinus tenderness. Right sinus exhibits no maxillary sinus tenderness. Left sinus exhibits frontal sinus tenderness. Left sinus exhibits no maxillary sinus tenderness.  Mouth/Throat: Uvula is midline, oropharynx is clear and moist and mucous membranes are normal. No oropharyngeal exudate, posterior oropharyngeal edema, posterior oropharyngeal erythema or tonsillar abscesses.  Eyes: Conjunctivae and EOM are normal. Pupils are equal, round, and reactive to light. No scleral icterus.  Neck: Normal range of motion. Neck supple. No thyromegaly present.  Cardiovascular: Normal rate, regular rhythm, normal heart sounds and intact distal pulses.   No murmur heard. Pulmonary/Chest: Effort normal and breath sounds normal. No respiratory distress. She has no wheezes. She has no rales.  Lymphadenopathy:    She has no cervical adenopathy.  Skin: Skin is warm and dry. No rash noted.  Nursing note and vitals reviewed.     Assessment & Plan:   Problem List Items Addressed This Visit    Malaise and fatigue -  Primary    Unclear etiology but anticipate dehydration contributing - UA >1.030 with ketones but no glucosuria. ?related to recent bladder procedures - advised may need f/u with urology. Advised push fluids and rest over next several days. Endorsed some constipation - rec start miralax as well. Not consistent with obstruction or diverticulitis. Able to provide urine sample today without concerns. Check stat labs today in diabetic with dehydration (CMP, CBC, lipase) to r/o other causes - labs returned with ARK and Cr 1.6. Called patient and advised in addition to pushing fluids over weekend to cut exforge in half and decrease lasix to 40mg  once daily. rec /u with PCP this week for recheck, and if worsening malaise to seek ER care for IV fluids. Pt/husband agree with plan      Relevant Orders   POC Influenza A/B (Completed)   POC Urinalysis Dipstick (Completed)  Comprehensive metabolic panel   CBC with Differential/Platelet   Lipase   Acute renal failure    Based on labwork , anticipate pre renal from poor PO intake in the last week See above for plan - including decreased exforge and lasix dose and recheck this week with PCP.       Other Visit Diagnoses    Dehydration            Follow up plan: Return if symptoms worsen or fail to improve.

## 2014-11-05 NOTE — Assessment & Plan Note (Addendum)
Unclear etiology but anticipate dehydration contributing - UA >1.030 with ketones but no glucosuria. ?related to recent bladder procedures - advised may need f/u with urology. Advised push fluids and rest over next several days. Endorsed some constipation - rec start miralax as well. Not consistent with obstruction or diverticulitis. Able to provide urine sample today without concerns. Check stat labs today in diabetic with dehydration (CMP, CBC, lipase) to r/o other causes - labs returned with ARK and Cr 1.6. Called patient and advised in addition to pushing fluids over weekend to cut exforge in half and decrease lasix to 40mg  once daily. rec /u with PCP this week for recheck, and if worsening malaise to seek ER care for IV fluids. Pt/husband agree with plan

## 2014-11-05 NOTE — Assessment & Plan Note (Deleted)
sxs consistent with influenza like illness - check flu swab today. I don't see obvious bacterial source of infection today. Supportive care recommended as per patient instructions. Update if not improving over next several days. Pt/husband agree with plan.

## 2014-11-05 NOTE — Patient Instructions (Addendum)
You need to increase water - small sips throughout the day.  For possible constipation - start miralax 1/2 capful in [place] water daily. If any abdominal pain or fever or not improving, seek urgent care over the weekend. Otherwise follow up with Dr Jenny Reichmann this week. Pass by lab in hospital across the street for [place].

## 2014-11-05 NOTE — Assessment & Plan Note (Signed)
Based on labwork , anticipate pre renal from poor PO intake in the last week See above for plan - including decreased exforge and lasix dose and recheck this week with PCP.

## 2014-11-07 ENCOUNTER — Other Ambulatory Visit (INDEPENDENT_AMBULATORY_CARE_PROVIDER_SITE_OTHER): Payer: Medicare Other

## 2014-11-07 ENCOUNTER — Encounter: Payer: Self-pay | Admitting: Family Medicine

## 2014-11-07 ENCOUNTER — Ambulatory Visit (INDEPENDENT_AMBULATORY_CARE_PROVIDER_SITE_OTHER): Payer: Medicare Other | Admitting: Family Medicine

## 2014-11-07 ENCOUNTER — Telehealth: Payer: Self-pay | Admitting: *Deleted

## 2014-11-07 ENCOUNTER — Other Ambulatory Visit: Payer: Medicare Other

## 2014-11-07 VITALS — BP 110/64 | HR 90 | Ht 65.25 in | Wt 248.0 lb

## 2014-11-07 DIAGNOSIS — E038 Other specified hypothyroidism: Secondary | ICD-10-CM

## 2014-11-07 DIAGNOSIS — IMO0002 Reserved for concepts with insufficient information to code with codable children: Secondary | ICD-10-CM

## 2014-11-07 DIAGNOSIS — E890 Postprocedural hypothyroidism: Secondary | ICD-10-CM

## 2014-11-07 DIAGNOSIS — M542 Cervicalgia: Secondary | ICD-10-CM

## 2014-11-07 DIAGNOSIS — E1165 Type 2 diabetes mellitus with hyperglycemia: Secondary | ICD-10-CM

## 2014-11-07 LAB — BASIC METABOLIC PANEL
BUN: 21 mg/dL (ref 6–23)
CO2: 22 mEq/L (ref 19–32)
Calcium: 9.1 mg/dL (ref 8.4–10.5)
Chloride: 108 mEq/L (ref 96–112)
Creatinine, Ser: 1.84 mg/dL — ABNORMAL HIGH (ref 0.40–1.20)
GFR: 35.44 mL/min — ABNORMAL LOW (ref >60.00)
Glucose, Bld: 148 mg/dL — ABNORMAL HIGH (ref 70–99)
Potassium: 4 mEq/L (ref 3.5–5.1)
Sodium: 137 mEq/L (ref 135–145)

## 2014-11-07 LAB — TSH: TSH: 9.13 u[IU]/mL — ABNORMAL HIGH (ref 0.35–4.50)

## 2014-11-07 NOTE — Patient Instructions (Addendum)
Good to see you Your hand is doing well. You are good to go Your neck seems to be doing well. Continue the exercises 3 times a week.  Gabapentin every other day for 2 weeks.  If pain increases go back to daily If pain is still good then can discontinue See me when you need me.

## 2014-11-07 NOTE — Progress Notes (Signed)
Pre visit review using our clinic review tool, if applicable. No additional management support is needed unless otherwise documented below in the visit note. 

## 2014-11-07 NOTE — Assessment & Plan Note (Signed)
Patient is doing significantly better at this time. Patient is having very minimal radicular symptoms. I think patient is coming to well and encourage her to continue to do the exercises. Patient has any worsening difficulty she'll come back for further evaluation. Otherwise patient in follow-up on an as-needed basis.

## 2014-11-07 NOTE — Progress Notes (Signed)
  [name] [place], [place] [address] Phone: [phone] Subjective:    CC:   right arm pain follow up hand fracture.   [id number] [name] is a 64 y.o. female coming in for follow up of lr worst pain seems to be her left arm. Patient states that she is having a dull throbbing aching pain that feels like a toothache that is going from her shoulder all the way down to her elbow and sometimes to her hand. Patient had this pain chronically and was sent for an MRI that showed multiple areas of nerve root impingement secondary to degenerative disc disease. Patient has been referred to neurosurgery. Reviewing neurosurgery notes they feel there has not been any extensive changes from her previous MRI in 2010 and would not make changes. .patient continues to improve at this time. Patient states that the radicular symptoms in the arm are almost nonexistent at this time. Patient states that she is very happy with the results. Would like to avoid another epidural.    Patient is also returning for a hand fracture. Patient did have a displaced angulated fracture of the fifth metatarsal.  Patient states she is no longer wearing the brace. Patient is happy with the results. Some mild discomfort if she lays the hand in certain positions or tries to hold something too heavy. Denies any swelling. States that the bump that she has seen on the hand previously has lessened as well.  Past medical history, social, surgical and family history all reviewed in electronic medical record.   Review of Systems: No headache, visual changes, nausea, vomiting, diarrhea, constipation, dizziness, abdominal pain, skin rash, fevers, chills, night sweats, weight loss, swollen lymph nodes, body aches, joint swelling, muscle aches, chest pain, shortness of breath, mood changes.   Objective Blood pressure 110/64, pulse 90, height 5' 5.25" (1.657 m), weight 248 lb (112.492 kg), SpO2  97 %.  General: No apparent distress alert and oriented x3 mood and affect normal, dressed appropriately.  HEENT: Pupils equal, extraocular movements intact  Respiratory: Patient's speak in full sentences and does not appear short of breath  Cardiovascular: No lower extremity edema, non tender, no erythema  Skin: Warm dry intact with no signs of infection or rash on extremities or on axial skeleton.  Abdomen: Soft nontender  Neuro: Cranial nerves II through XII are intact, neurovascularly intact in all extremities with 2+ DTRs and 2+ pulses.  Lymph: No lymphadenopathy of posterior or anterior cervical chain or axillae bilaterally.  Gait normal with good balance and coordination.  MSK:  Non tender with full range of motion and good stability and symmetric strength and tone of   hip, knee and ankles bilaterally.  Hand exam on the right side no swelling. Patient continues to be tender over the fifth metatarsal but very minimal. Patient does have about 7 of angulation still of the fifth metacarpal. Good grip strength. All improved from previous exam.  Neck: Inspection mild decrease in lordosis with increasing kyphosis at T1 vertebrae. No palpable stepoffs. Decreased radicular symptoms with Spurling's Mild limitation with side bending and rotation bilaterally. Grip strength and sensation normal in bilateral hands Patient does have some weakness in the C7-T1 distribution. No sensory change to C4 to T1 Negative Hoffman sign bilaterally Reflexes normal         Impression and Recommendations:     This case required medical decision making of moderate complexity.

## 2014-11-07 NOTE — Telephone Encounter (Signed)
[facility] - Client [facility] Patient Name: [name] Gender: Female DOB: 08/26/1951 Age: 64 Y 107 M 9 D Return Phone Number: [phone] (Primary) Address: [address] City/State/Zip: [place] [address] Client [facility] Night - Client Client Site [facility] - Night Physician [place], [place] Type Call Call Type Triage / Clinical Relationship To Patient Self Return Phone Number [phone] (Primary) Chief Complaint BREATHING - shortness of breath or sounds breathless Initial Comment Caller states, she is achy, feels weak, shortness of breath -- wants an appt PreDisposition Did not know what to do Nurse Assessment Nurse: Leilani Merl, RN, Heather Date/Time (Eastern Time): 11/05/2014 9:27:34 AM Confirm and document reason for call. If symptomatic, describe symptoms. ---Caller states, she is achy, feels weak, shortness of breath, it started about a week ago and is not getting better. Has the patient traveled out of the country within the last 30 days? ---Not Applicable Does the patient require triage? ---Yes Related visit to physician within the last 2 weeks? ---No Does the PT have any chronic conditions? (i.e. diabetes, asthma, etc.) ---Yes List chronic conditions. ---DM, thyroid problems, HTN Guidelines Guideline Title Affirmed Question Affirmed Notes Nurse Date/Time (Eastern Time) Breathing Difficulty [1] MILD difficulty breathing (e.g., minimal/ no SOB at rest, SOB with walking, pulse <100) AND [2] NEW-onset or WORSE than normal Standifer, RN, Heather 11/05/2014 9:28:31 AM Disp. Time Eilene Ghazi Time) Disposition Final User 11/05/2014 9:25:37 AM Send to Urgent Queue Eather Colas 11/05/2014 9:32:01 AM Call Completed Standifer, RN, Nira Conn 11/05/2014 9:29:50 AM See Physician within 4 Hours (or PCP triage) Yes Standifer, RN, Nira Conn PLEASE NOTE: All timestamps contained  within this report are represented as [place] Standard Time. CONFIDENTIALTY NOTICE: This fax transmission is intended only for the addressee. It contains information that is legally privileged, confidential or otherwise protected from use or disclosure. If you are not the intended recipient, you are strictly prohibited from reviewing, disclosing, copying using or disseminating any of this information or taking any action in reliance on or regarding this information. If you have received this fax in error, please notify us immediately by telephone so that we can arrange for its return to [place]. Phone: [phone], Toll-Free: [phone], Fax: [phone] Page: 2 of 2 Call Id: [id number] [place] Understands: Yes Disagree/Comply: Comply Care Advice Given Per Guideline SEE PHYSICIAN WITHIN 4 HOURS (or PCP triage): CALL BACK IF: * You become worse. CARE ADVICE given per Breathing Difficulty (Adult) guideline. After Care Instructions Given Call Event Type User Date / Time Description Referrals REFERRED TO PCP OFFICE

## 2014-11-09 LAB — FRUCTOSAMINE: Fructosamine: 278 umol/L — ABNORMAL HIGH (ref 190–270)

## 2014-11-10 ENCOUNTER — Encounter: Payer: Self-pay | Admitting: Endocrinology

## 2014-11-10 ENCOUNTER — Ambulatory Visit (INDEPENDENT_AMBULATORY_CARE_PROVIDER_SITE_OTHER): Payer: Medicare Other | Admitting: Endocrinology

## 2014-11-10 VITALS — BP 146/95 | HR 96 | Temp 98.2°F | Resp 16 | Ht 65.25 in | Wt 246.0 lb

## 2014-11-10 DIAGNOSIS — E890 Postprocedural hypothyroidism: Secondary | ICD-10-CM

## 2014-11-10 DIAGNOSIS — IMO0002 Reserved for concepts with insufficient information to code with codable children: Secondary | ICD-10-CM

## 2014-11-10 DIAGNOSIS — E038 Other specified hypothyroidism: Secondary | ICD-10-CM

## 2014-11-10 DIAGNOSIS — E1165 Type 2 diabetes mellitus with hyperglycemia: Secondary | ICD-10-CM

## 2014-11-10 DIAGNOSIS — N289 Disorder of kidney and ureter, unspecified: Secondary | ICD-10-CM

## 2014-11-10 DIAGNOSIS — I10 Essential (primary) hypertension: Secondary | ICD-10-CM

## 2014-11-10 MED ORDER — LEVOTHYROXINE SODIUM 100 MCG PO TABS: 100.0000 ug | ORAL_TABLET | Freq: Every day | ORAL | Status: DC

## 2014-11-10 NOTE — Patient Instructions (Addendum)
Novolog 12 units at supper and may adjust +/- 2 units for meals size  75 dose 1 daily plus 1 extra 2x per week  Stop metformin

## 2014-11-10 NOTE — Progress Notes (Signed)
[name] is an 64 y.o. female.    Reason for Appointment: Diabetes follow-up   History of Present Illness   Diagnosis: Type 2 DIABETES MELITUS, date of diagnosis: 2000  Previous history: She had initially been treated with metformin and Amaryl and subsequently given Victoza which helped overall control and weight loss. In 9/13 because of significant hyperglycemia she was started on basal bolus insulin also. Had required relatively small doses of insulin. Her blood sugar control has been excellent with A1c between 5.3-6.0 although this may be relatively higher than expected. Tends to have relatively higher readings after supper In 6/15 she was told to resume her Victoza and stop her Lantus since she had been gaining weight with Lantus and NovoLog regimen.   Recent history:  She continues to take Victoza 1.2  even though on her last visit she was told to switch to Lantus because of persistent nausea. However she says that her nausea had improved and she is not having this problem now Also her NovoLog was increased to twice a day to help with postprandial hyperglycemia Current blood sugar patterns and management:  Fasting blood sugars are mostly near normal although has sporadic high readings, usually checking these between 10 AM and 12 noon  Usually not checking her blood sugar after lunch, has only one reading which was good.  Blood sugars after evening meal.recently much higher although previously was checking blood sugars several hours after evening meal  She is usually taking her NovoLog right before eating but does not adjusted based on meal size  She does not think she is going off her diet  He still has difficulty losing weight which is fluctuating  Fructosamine indicates improved control  A1c is usualower than expectedase, was 6.4 previously    Oral hypoglycemic drugs: Amaryl  1 mg and metformin 1 g   Side effects from medications:  none INSULIN:  10  Novolog ac bid Monitors blood glucose: Once a day.    Glucometer:  Accu-Chek compact.          Blood Glucose readings from download  PRE-MEAL Breakfast Lunch Dinner Bedtime Overall  Glucose range:  96-192     89-326    Mean/median:  130     190   155    Hypoglycemia: None    Meals: 1-3 meals per day. Supper at 5-7 PM pm          Physical activity: exercise: Limited, has had fatigue, back and leg pain.           Dietician visit: Most recent: 12/8754           Complications: are: None  Wt Readings from Last 3 Encounters:  11/10/14 246 lb (111.585 kg)  11/07/14 248 lb (112.492 kg)  11/05/14 243 lb (110.224 kg)    LABS:  Lab Results  Component Value Date   HGBA1C 6.4 09/26/2014   HGBA1C 5.7 04/06/2014   HGBA1C 5.9 02/23/2014   Lab Results  Component Value Date   MICROALBUR 0.4 02/23/2014   LDLCALC 75 02/23/2014   CREATININE 1.84* 11/07/2014    Appointment on 11/07/2014  Component Date Value Ref Range Status  . Sodium 11/07/2014 137  135 - 145 mEq/L Final  . Potassium 11/07/2014 4.0  3.5 - 5.1 mEq/L Final  . Chloride 11/07/2014 108  96 - 112 mEq/L Final  . CO2 11/07/2014 22  19 - 32 mEq/L Final  . Glucose, Bld 11/07/2014 148* 70 - 99 mg/dL Final  .  BUN 11/07/2014 21  6 - 23 mg/dL Final  . Creatinine, Ser 11/07/2014 1.84* 0.40 - 1.20 mg/dL Final  . Calcium 11/07/2014 9.1  8.4 - 10.5 mg/dL Final  . GFR 11/07/2014 35.44* >60.00 mL/min Final  . Fructosamine 11/07/2014 278* 190 - 270 umol/L Final  . TSH 11/07/2014 9.13* 0.35 - 4.50 uIU/mL Final  Office Visit on 11/05/2014  Component Date Value Ref Range Status  . Influenza A, POC 11/05/2014 Negative   Final  . Influenza B, POC 11/05/2014 Negative   Final  . Color, UA 11/05/2014 yellow   Final  . Clarity, UA 11/05/2014 clear   Final  . Glucose, UA 11/05/2014 neg   Final  . Bilirubin, UA 11/05/2014 neg   Final  . Ketones, UA 11/05/2014 positive   Final  . Spec Grav, UA 11/05/2014 >=1.030   Final  . Blood, UA 11/05/2014  neg   Final  . pH, UA 11/05/2014 5.0   Final  . Protein, UA 11/05/2014 trace   Final  . Urobilinogen, UA 11/05/2014 negative   Final  . Nitrite, UA 11/05/2014 neg   Final  . Leukocytes, UA 11/05/2014 Negative   Final  . Sodium 11/05/2014 138  135 - 145 mEq/L Final  . Potassium 11/05/2014 4.4  3.5 - 5.3 mEq/L Final  . Chloride 11/05/2014 107  96 - 112 mEq/L Final  . CO2 11/05/2014 23  19 - 32 mEq/L Final  . Glucose, Bld 11/05/2014 163* 70 - 99 mg/dL Final  . BUN 11/05/2014 25* 6 - 23 mg/dL Final  . Creat 11/05/2014 1.67* 0.50 - 1.10 mg/dL Final  . Total Bilirubin 11/05/2014 0.8  0.3 - 1.2 mg/dL Final  . Alkaline Phosphatase 11/05/2014 60  39 - 117 U/L Final  . AST 11/05/2014 15  0 - 37 U/L Final  . ALT 11/05/2014 12  0 - 35 U/L Final  . Total Protein 11/05/2014 7.5  6.0 - 8.3 g/dL Final  . Albumin 11/05/2014 4.1  3.5 - 5.2 g/dL Final  . Calcium 11/05/2014 9.9  8.4 - 10.5 mg/dL Final  . WBC 11/05/2014 9.7  4.0 - 10.5 K/uL Final  . RBC 11/05/2014 3.69* 3.87 - 5.11 MIL/uL Final  . Hemoglobin 11/05/2014 11.6* 12.0 - 15.0 g/dL Final  . HCT 11/05/2014 37.0  36.0 - 46.0 % Final  . MCV 11/05/2014 100.3* 78.0 - 100.0 fL Final  . MCH 11/05/2014 31.4  26.0 - 34.0 pg Final  . MCHC 11/05/2014 31.4  30.0 - 36.0 g/dL Final  . RDW 11/05/2014 12.5  11.5 - 15.5 % Final  . Platelets 11/05/2014 260  150 - 400 K/uL Final  . MPV 11/05/2014 Not Performed  8.6 - 12.4 fL Final   ** Please note change in reference range(s). **  . Neutrophils Relative % 11/05/2014 72  43 - 77 % Final  . Neutro Abs 11/05/2014 7.0  1.7 - 7.7 K/uL Final  . Lymphocytes Relative 11/05/2014 24  12 - 46 % Final  . Lymphs Abs 11/05/2014 2.3  0.7 - 4.0 K/uL Final  . Monocytes Relative 11/05/2014 3  3 - 12 % Final  . Monocytes Absolute 11/05/2014 0.3  0.1 - 1.0 K/uL Final  . Eosinophils Relative 11/05/2014 1  0 - 5 % Final  . Eosinophils Absolute 11/05/2014 0.1  0.0 - 0.7 K/uL Final  . Basophils Relative 11/05/2014 0  0 - 1 % Final   . Basophils Absolute 11/05/2014 0.0  0.0 - 0.1 K/uL Final  . Smear Review  11/05/2014 Criteria for review not met   Final  . Lipase 11/05/2014 47  11 - 59 U/L Final      Medication List       This list is accurate as of: 11/10/14 11:59 PM.  Always use your most recent med list.               AMBULATORY NON FORMULARY MEDICATION  Domperidone 10 mg    Take one tablet by mouth twice a day before meals     amLODipine-valsartan 10-320 MG per tablet  Commonly known as:  EXFORGE  Take 0.5 tablets by mouth daily.     aspirin 81 MG EC tablet  Take 81 mg by mouth daily.     Diclofenac Sodium 2 % Soln  Commonly known as:  PENNSAID  Place 2 application onto the skin 2 (two) times daily.     ELMIRON 100 MG capsule  Generic drug:  pentosan polysulfate  Take 100 mg by mouth 2 (two) times daily.     estradiol 1 MG tablet  Commonly known as:  ESTRACE  TAKE 1 TABLET (1 MG TOTAL) BY MOUTH DAILY.     FLUoxetine 20 MG capsule  Commonly known as:  PROZAC     furosemide 40 MG tablet  Commonly known as:  LASIX  Take 1 tablet (40 mg total) by mouth daily.     gabapentin 300 MG capsule  Commonly known as:  NEURONTIN  Take 300 mg by mouth at bedtime. Take one with a 600 mg at night     gabapentin 600 MG tablet  Commonly known as:  NEURONTIN  Take 600 mg by mouth 2 (two) times daily.     glimepiride 1 MG tablet  Commonly known as:  AMARYL  TAKE 1 TABLET BY MOUTH WITH SUPPER ONCE A DAY PER DR. Dwyane Dee     glucose blood test strip  Commonly known as:  ACCU-CHEK COMPACT PLUS  Use as instructed to check blood sugar 2 times per day dx code E11.9     HYDROcodone-acetaminophen 7.5-325 MG per tablet  Commonly known as:  NORCO  Take 1 tablet by mouth every 8 (eight) hours as needed for moderate pain. (cough)     isosorbide dinitrate 10 MG tablet  Commonly known as:  ISORDIL  TAKE ONE TABLET BY MOUTH DAILY     levothyroxine 100 MCG tablet  Commonly known as:  SYNTHROID, LEVOTHROID  Take 1  tablet (100 mcg total) by mouth daily.     Linaclotide 145 MCG Caps capsule  Commonly known as:  LINZESS  Take 1 capsule (145 mcg total) by mouth every other day.     metFORMIN 500 MG 24 hr tablet  Commonly known as:  GLUCOPHAGE-XR  Take 1 tablet (500 mg total) by mouth 2 (two) times daily.     metoprolol 50 MG tablet  Commonly known as:  LOPRESSOR  Take one and one-half tablets twice a day     NOVOLOG FLEXPEN 100 UNIT/ML FlexPen  Generic drug:  insulin aspart  Inject 10 Units into the skin 3 (three) times daily with meals.     omeprazole 20 MG capsule  Commonly known as:  PRILOSEC  Take 1 capsule (20 mg total) by mouth daily.     ondansetron 4 MG tablet  Commonly known as:  ZOFRAN  Take 1 tab every 6 hours as needed for nausea.     phenazopyridine 95 MG tablet  Commonly known as:  PYRIDIUM  Take 1  tablet (95 mg total) by mouth 3 (three) times daily as needed for pain.     simvastatin 20 MG tablet  Commonly known as:  ZOCOR  TAKE 1 TABLET BY MOUTH DAILY     sucralfate 1 GM/10ML suspension  Commonly known as:  CARAFATE  Take 2 teaspoons three times daily before meals.     traMADol 50 MG tablet  Commonly known as:  ULTRAM  TAKE 1 TABLET BY MOUTH EVERY 6 HOURS AS NEEDED FOR MODERATE PAIN     trimethoprim 100 MG tablet  Commonly known as:  TRIMPEX  Take 100 mg by mouth daily.     venlafaxine 37.5 MG tablet  Commonly known as:  EFFEXOR  TAKE 2 TABLETS (75 MG TOTAL) BY MOUTH 2 (TWO) TIMES DAILY WITH A MEAL.     VICTOZA 18 MG/3ML Sopn  Generic drug:  Liraglutide  Inject 1.2 mg into the skin daily. Inject once daily at the same time        Allergies:  Allergies  Allergen Reactions  . Hydrocodone Other (See Comments)    tachycardia  . Ciprofloxacin Other (See Comments)    dizzy  . Penicillins Hives    Past Medical History  Diagnosis Date  . COMMON MIGRAINE   . DIVERTICULOSIS-COLON   . CORONARY ARTERY DISEASE   . Gastroparesis   . INSOMNIA-SLEEP  DISORDER-UNSPEC   . Iron deficiency anemia   . Anxiety   . DEPRESSION   . Diabetes mellitus, type II   . GERD (gastroesophageal reflux disease)   . Hyperlipidemia   . Hypertension   . Gastric ulcer 04/2008  . CERVICAL RADICULOPATHY, LEFT   . Hiatal hernia   . Hypothyroidism   . Arthritis     Past Surgical History  Procedure Laterality Date  . Left knee replacement    . Rigth knee replacement with revision  04/2008    Dr. Berenice Primas  . Gastric wedge resection lipoma  11/2007    x2 with laparotomy and gastrostomy  . Rotator cuff repair  01/2009  . Cholecystectomy  06/2009  . S/p bladder surgury  09/2009    Dr. Terance Hart  . Abdominal hysterectomy      Family History  Problem Relation Age of Onset  . Diabetes Sister     x 3  . Heart disease Sister     x2  . Diabetes Brother     x3  . Heart disease Brother     x2  . Coronary artery disease Other     female 1st degree  . Hypertension Other   . Colon cancer Neg Hx     Social History:  reports that she has quit smoking. She has never used smokeless tobacco. She reports that she does not drink alcohol or use illicit drugs.  Review of Systems:  Hypertension: Her blood pressure is treated with metoprolol and Exforge Not monitoring blood pressure at home She was told by her PCP to reduce her Exforge to half tablet because of higher creatinine and possible dehydration, blood pressure was low normal also She tends to have relatively fast heart rate of unclear etiology, possibly autonomic neuropathy  Mild CKD: Her creatinine is recently higher and she thinks this is from dehydration from acute illness.  Not on any nonsteroidal anti-inflammatory drugs.   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 She does feel tired although this is not necessarily new   Lab Results  Component Value Date   TSH 9.13* 11/07/2014   TSH 6.94* 09/26/2014   TSH 2.09 04/06/2014   FREET4 0.64 04/06/2014   FREET4 0.96 02/24/2014   FREET4 0.94 10/12/2013      Nausea: Her gastric emptying scan in 4/14 showed relatively good emptying of 25% at 2 hours, on Zofran as needed with good control of symptoms.  No diarrhea     Examination:   BP 146/95 mmHg  Pulse 96  Temp(Src) 98.2 F (36.8 C)  Resp 16  Ht 5' 5.25" (1.657 m)  Wt 246 lb (111.585 kg)  BMI 40.64 kg/m2  SpO2 97%  Body mass index is 40.64 kg/(m^2).   No pedal edema present  ASSESSMENT/ PLAN:   Diabetes type 2, uncontrolled :   Blood glucose control is overall  slightly better with her being consistent with her Victoza and pre-meal NovoLog. Fructosamine indicates overall improved readings and her level is just above normal However her blood sugars after evening meal are significantly high at times, once 300+ She is not adjusting her NovoLog based on meal size as discussed in history of present illness and this will cause variability in blood sugars.  Occasionally may have high readings overnight also Generally fasting blood sugars are controlled with her Victoza and metformin  Recommendations made:  Since her renal function is worse will hold off on metformin for now  Continue Victoza unchanged  Increase suppertime NovoLog by at least 2 units, discussed dosage adjustment  More blood sugars after lunch  Start walking for exercise as  tolerated  Hypothyroidism: TSH higher on the 75 g dose even with her taking the medication consistently Will increase her dose to 100 g but meanwhile she can finish of the 75 g with 9 tablets a week  HYPERTENSION: Blood pressure is not controlled and higher with reducing her Exforge recently.  He can go back to taking the full tablet for now and follow-up with PCP   Renal insufficiency: Creatinine is  significantly high at 1.8 but she was apparently having relatively low blood pressure readings and possible dehydration. This will need to be followed up.  Does not have history of nephropathy   Counseling time over 50% of today's 25 minute visit  Patient Instructions  Novolog 12 units at supper and may adjust +/- 2 units for meals size  75 dose 1 daily plus 1 extra 2x per week  Stop metformin     Zolton Dowson 11/11/2014, 9:54 AM

## 2014-11-14 ENCOUNTER — Ambulatory Visit (INDEPENDENT_AMBULATORY_CARE_PROVIDER_SITE_OTHER): Payer: Medicare Other | Admitting: Internal Medicine

## 2014-11-14 ENCOUNTER — Encounter: Payer: Self-pay | Admitting: Internal Medicine

## 2014-11-14 VITALS — BP 134/80 | HR 76 | Temp 98.5°F | Resp 16 | Wt 252.0 lb

## 2014-11-14 DIAGNOSIS — M545 Low back pain: Secondary | ICD-10-CM

## 2014-11-14 MED ORDER — HYDROCODONE-ACETAMINOPHEN 7.5-325 MG PO TABS: 1.0000 | ORAL_TABLET | Freq: Four times a day (QID) | ORAL | Status: DC | PRN

## 2014-11-14 NOTE — Progress Notes (Signed)
Pre visit review using our clinic review tool, if applicable. No additional management support is needed unless otherwise documented below in the visit note. 

## 2014-11-14 NOTE — Patient Instructions (Signed)
Back Pain, Adult Low back pain is very common. About 1 in 5 people have back pain.The cause of low back pain is rarely dangerous. The pain often gets better over time.About half of people with a sudden onset of back pain feel better in just 2 weeks. About 8 in 10 people feel better by 6 weeks.  CAUSES Some common causes of back pain include:  Strain of the muscles or ligaments supporting the spine.  Wear and tear (degeneration) of the spinal discs.  Arthritis.  Direct injury to the back. DIAGNOSIS Most of the time, the direct cause of low back pain is not known.However, back pain can be treated effectively even when the exact cause of the pain is unknown.Answering your caregiver's questions about your overall health and symptoms is one of the most accurate ways to make sure the cause of your pain is not dangerous. If your caregiver needs more information, he or she may order lab work or imaging tests (X-rays or MRIs).However, even if imaging tests show changes in your back, this usually does not require surgery. HOME CARE INSTRUCTIONS For many people, back pain returns.Since low back pain is rarely dangerous, it is often a condition that people can learn to manageon their own.   Remain active. It is stressful on the back to sit or stand in one place. Do not sit, drive, or stand in one place for more than 30 minutes at a time. Take short walks on level surfaces as soon as pain allows.Try to increase the length of time you walk each day.  Do not stay in bed.Resting more than 1 or 2 days can delay your recovery.  Do not avoid exercise or work.Your body is made to move.It is not dangerous to be active, even though your back may hurt.Your back will likely heal faster if you return to being active before your pain is gone.  Pay attention to your body when you bend and lift. Many people have less discomfortwhen lifting if they bend their knees, keep the load close to their bodies,and  avoid twisting. Often, the most comfortable positions are those that put less stress on your recovering back.  Find a comfortable position to sleep. Use a firm mattress and lie on your side with your knees slightly bent. If you lie on your back, put a pillow under your knees.  Only take over-the-counter or prescription medicines as directed by your caregiver. Over-the-counter medicines to reduce pain and inflammation are often the most helpful.Your caregiver may prescribe muscle relaxant drugs.These medicines help dull your pain so you can more quickly return to your normal activities and healthy exercise.  Put ice on the injured area.  Put ice in a plastic bag.  Place a towel between your skin and the bag.  Leave the ice on for 15-20 minutes, 03-04 times a day for the first 2 to 3 days. After that, ice and heat may be alternated to reduce pain and spasms.  Ask your caregiver about trying back exercises and gentle massage. This may be of some benefit.  Avoid feeling anxious or stressed.Stress increases muscle tension and can worsen back pain.It is important to recognize when you are anxious or stressed and learn ways to manage it.Exercise is a great option. SEEK MEDICAL CARE IF:  You have pain that is not relieved with rest or medicine.  You have pain that does not improve in 1 week.  You have new symptoms.  You are generally not feeling well. SEEK   IMMEDIATE MEDICAL CARE IF:   You have pain that radiates from your back into your legs.  You develop new bowel or bladder control problems.  You have unusual weakness or numbness in your arms or legs.  You develop nausea or vomiting.  You develop abdominal pain.  You feel faint. Document Released: 09/23/2005 Document Revised: 03/24/2012 Document Reviewed: 01/25/2014 Exit[employer] Patient Information 2015 [employer]. This information is not intended to replace advice given to you by your health care provider. Make sure you  discuss any questions you have with your health care provider.  

## 2014-11-14 NOTE — Progress Notes (Signed)
Subjective:    Patient ID: [name], female    DOB: 29-Dec-1950, 64 y.o.   MRN: [id number]  Back Pain This is a recurrent problem. The current episode started more than 1 month ago. The problem occurs intermittently. The problem is unchanged. The pain is present in the lumbar spine. The quality of the pain is described as stabbing. The pain does not radiate. The pain is at a severity of 4/10. The pain is moderate. The pain is worse during the day. The symptoms are aggravated by bending, position and standing. Pertinent negatives include no abdominal pain, bladder incontinence, bowel incontinence, chest pain, dysuria, fever, headaches, leg pain, numbness, paresis, paresthesias, pelvic pain, perianal numbness, tingling, weakness or weight loss. Risk factors include obesity and lack of exercise. She has tried NSAIDs for the symptoms. The treatment provided moderate relief.      Review of Systems  Constitutional: Negative.  Negative for fever, chills, weight loss, diaphoresis, appetite change and fatigue.  HENT: Negative.   Eyes: Negative.   Respiratory: Negative.  Negative for cough, choking, chest tightness, shortness of breath and stridor.   Cardiovascular: Negative.  Negative for chest pain, palpitations and leg swelling.  Gastrointestinal: Negative.  Negative for nausea, vomiting, abdominal pain, diarrhea, constipation, blood in stool and bowel incontinence.  Endocrine: Negative.   Genitourinary: Negative.  Negative for bladder incontinence, dysuria and pelvic pain.  Musculoskeletal: Positive for back pain. Negative for myalgias, joint swelling, arthralgias and gait problem.  Skin: Negative.   Allergic/Immunologic: Negative.   Neurological: Negative.  Negative for tingling, weakness, numbness, headaches and paresthesias.  Hematological: Negative.  Negative for adenopathy. Does not bruise/bleed easily.  Psychiatric/Behavioral: Negative.        Objective:   Physical Exam    Constitutional: She is oriented to person, place, and time. She appears well-developed and well-nourished. No distress.  HENT:  Head: Normocephalic and atraumatic.  Mouth/Throat: Oropharynx is clear and moist. No oropharyngeal exudate.  Eyes: Conjunctivae are normal. Right eye exhibits no discharge. Left eye exhibits no discharge. No scleral icterus.  Neck: Normal range of motion. Neck supple. No JVD present. No tracheal deviation present. No thyromegaly present.  Cardiovascular: Normal rate, regular rhythm, normal heart sounds and intact distal pulses.  Exam reveals no gallop and no friction rub.   No murmur heard. Pulmonary/Chest: Effort normal and breath sounds normal. No stridor. No respiratory distress. She has no wheezes. She has no rales. She exhibits no tenderness.  Abdominal: Soft. Bowel sounds are normal. She exhibits no distension and no mass. There is no tenderness. There is no rebound and no guarding.  Musculoskeletal: Normal range of motion. She exhibits no edema or tenderness.       Lumbar back: Normal. She exhibits normal range of motion, no tenderness, no bony tenderness, no swelling, no edema, no deformity, no laceration, no pain, no spasm and normal pulse.  Lymphadenopathy:    She has no cervical adenopathy.  Neurological: She is oriented to person, place, and time. She displays normal reflexes.  Reflex Scores:      Tricep reflexes are 1+ on the right side and 1+ on the left side.      Bicep reflexes are 1+ on the right side and 1+ on the left side.      Brachioradialis reflexes are 1+ on the right side and 1+ on the left side.      Patellar reflexes are 1+ on the right side and 1+ on the left side.  Achilles reflexes are 1+ on the right side and 1+ on the left side. Neg SLR in BLE  Skin: Skin is warm and dry. No rash noted. She is not diaphoretic. No erythema. No pallor.  Vitals reviewed.    Lab Results  Component Value Date   WBC 9.7 11/05/2014   HGB 11.6*  11/05/2014   HCT 37.0 11/05/2014   PLT 260 11/05/2014   GLUCOSE 148* 11/07/2014   CHOL 152 02/23/2014   TRIG 105.0 02/23/2014   HDL 56.50 02/23/2014   LDLDIRECT 156.6 01/07/2011   LDLCALC 75 02/23/2014   ALT 12 11/05/2014   AST 15 11/05/2014   NA 137 11/07/2014   K 4.0 11/07/2014   CL 108 11/07/2014   CREATININE 1.84* 11/07/2014   BUN 21 11/07/2014   CO2 22 11/07/2014   TSH 9.13* 11/07/2014   INR 1.25 12/08/2010   HGBA1C 6.4 09/26/2014   MICROALBUR 0.4 02/23/2014       Assessment & Plan:

## 2014-11-16 ENCOUNTER — Encounter: Payer: Self-pay | Admitting: Internal Medicine

## 2014-11-16 NOTE — Assessment & Plan Note (Addendum)
Tramadol has not controlled her pain very well, will try norco She has been seeing ?pain specilaist and I have asked her to cont f/up with then Will recheck her plain xrays today to see if anything new or different is present There are no alarm features on her ROS or exam today

## 2014-11-24 ENCOUNTER — Other Ambulatory Visit: Payer: Medicare Other

## 2014-11-24 ENCOUNTER — Other Ambulatory Visit (INDEPENDENT_AMBULATORY_CARE_PROVIDER_SITE_OTHER): Payer: Medicare Other

## 2014-11-24 DIAGNOSIS — N289 Disorder of kidney and ureter, unspecified: Secondary | ICD-10-CM

## 2014-11-24 LAB — BASIC METABOLIC PANEL
BUN: 23 mg/dL (ref 6–23)
CO2: 22 mEq/L (ref 19–32)
Calcium: 9.5 mg/dL (ref 8.4–10.5)
Chloride: 109 mEq/L (ref 96–112)
Creatinine, Ser: 1.55 mg/dL — ABNORMAL HIGH (ref 0.40–1.20)
GFR: 43.19 mL/min — ABNORMAL LOW (ref >60.00)
Glucose, Bld: 128 mg/dL — ABNORMAL HIGH (ref 70–99)
Potassium: 4.3 mEq/L (ref 3.5–5.1)
Sodium: 137 mEq/L (ref 135–145)

## 2014-11-28 NOTE — Progress Notes (Signed)
Quick Note:  Please let patient know that the kidney function result is better and no further action needed ______

## 2015-01-05 ENCOUNTER — Other Ambulatory Visit: Payer: Medicare Other

## 2015-01-05 ENCOUNTER — Other Ambulatory Visit: Payer: Self-pay | Admitting: *Deleted

## 2015-01-05 DIAGNOSIS — E119 Type 2 diabetes mellitus without complications: Secondary | ICD-10-CM

## 2015-01-09 ENCOUNTER — Ambulatory Visit: Payer: Medicare Other | Admitting: Endocrinology

## 2015-01-13 ENCOUNTER — Other Ambulatory Visit: Payer: Self-pay | Admitting: *Deleted

## 2015-01-13 ENCOUNTER — Other Ambulatory Visit: Payer: Self-pay

## 2015-01-13 MED ORDER — AMLODIPINE BESYLATE-VALSARTAN 10-320 MG PO TABS: 1.0000 | ORAL_TABLET | Freq: Every day | ORAL | Status: DC

## 2015-01-13 MED ORDER — AMLODIPINE BESYLATE-VALSARTAN 10-320 MG PO TABS: 0.5000 | ORAL_TABLET | Freq: Every day | ORAL | Status: DC

## 2015-01-13 NOTE — Telephone Encounter (Signed)
Pt is needing refill on her amlodipine-valsartan...Johny Chess

## 2015-01-25 ENCOUNTER — Other Ambulatory Visit: Payer: Self-pay | Admitting: Endocrinology

## 2015-01-30 ENCOUNTER — Other Ambulatory Visit: Payer: Self-pay | Admitting: Endocrinology

## 2015-02-02 ENCOUNTER — Other Ambulatory Visit: Payer: Self-pay | Admitting: Internal Medicine

## 2015-02-06 DIAGNOSIS — D631 Anemia in chronic kidney disease: Secondary | ICD-10-CM | POA: Diagnosis not present

## 2015-02-10 DIAGNOSIS — M25551 Pain in right hip: Secondary | ICD-10-CM | POA: Diagnosis not present

## 2015-02-21 DIAGNOSIS — M25551 Pain in right hip: Secondary | ICD-10-CM | POA: Diagnosis not present

## 2015-02-27 ENCOUNTER — Other Ambulatory Visit: Payer: Self-pay | Admitting: Endocrinology

## 2015-02-27 DIAGNOSIS — N3010 Interstitial cystitis (chronic) without hematuria: Secondary | ICD-10-CM | POA: Diagnosis not present

## 2015-02-27 DIAGNOSIS — R350 Frequency of micturition: Secondary | ICD-10-CM | POA: Diagnosis not present

## 2015-02-28 DIAGNOSIS — N183 Chronic kidney disease, stage 3 (moderate): Secondary | ICD-10-CM | POA: Diagnosis not present

## 2015-02-28 DIAGNOSIS — E785 Hyperlipidemia, unspecified: Secondary | ICD-10-CM | POA: Diagnosis not present

## 2015-02-28 DIAGNOSIS — R002 Palpitations: Secondary | ICD-10-CM | POA: Diagnosis not present

## 2015-02-28 DIAGNOSIS — E039 Hypothyroidism, unspecified: Secondary | ICD-10-CM | POA: Diagnosis not present

## 2015-02-28 DIAGNOSIS — E119 Type 2 diabetes mellitus without complications: Secondary | ICD-10-CM | POA: Diagnosis not present

## 2015-02-28 DIAGNOSIS — E669 Obesity, unspecified: Secondary | ICD-10-CM | POA: Diagnosis not present

## 2015-02-28 DIAGNOSIS — I2510 Atherosclerotic heart disease of native coronary artery without angina pectoris: Secondary | ICD-10-CM | POA: Diagnosis not present

## 2015-02-28 DIAGNOSIS — R300 Dysuria: Secondary | ICD-10-CM | POA: Diagnosis not present

## 2015-02-28 DIAGNOSIS — I10 Essential (primary) hypertension: Secondary | ICD-10-CM | POA: Diagnosis not present

## 2015-03-01 ENCOUNTER — Other Ambulatory Visit: Payer: Self-pay | Admitting: *Deleted

## 2015-03-01 ENCOUNTER — Other Ambulatory Visit: Payer: Self-pay | Admitting: Internal Medicine

## 2015-03-01 ENCOUNTER — Ambulatory Visit (INDEPENDENT_AMBULATORY_CARE_PROVIDER_SITE_OTHER): Payer: Medicare Other | Admitting: Endocrinology

## 2015-03-01 ENCOUNTER — Other Ambulatory Visit (INDEPENDENT_AMBULATORY_CARE_PROVIDER_SITE_OTHER): Payer: Medicare Other

## 2015-03-01 ENCOUNTER — Encounter: Payer: Self-pay | Admitting: Endocrinology

## 2015-03-01 VITALS — BP 130/78 | HR 87 | Temp 98.2°F | Resp 16 | Ht 65.5 in | Wt 245.8 lb

## 2015-03-01 DIAGNOSIS — E890 Postprocedural hypothyroidism: Secondary | ICD-10-CM

## 2015-03-01 DIAGNOSIS — N3010 Interstitial cystitis (chronic) without hematuria: Secondary | ICD-10-CM | POA: Diagnosis not present

## 2015-03-01 DIAGNOSIS — E119 Type 2 diabetes mellitus without complications: Secondary | ICD-10-CM

## 2015-03-01 DIAGNOSIS — E1165 Type 2 diabetes mellitus with hyperglycemia: Secondary | ICD-10-CM

## 2015-03-01 DIAGNOSIS — E038 Other specified hypothyroidism: Secondary | ICD-10-CM | POA: Diagnosis not present

## 2015-03-01 DIAGNOSIS — IMO0002 Reserved for concepts with insufficient information to code with codable children: Secondary | ICD-10-CM

## 2015-03-01 DIAGNOSIS — N182 Chronic kidney disease, stage 2 (mild): Secondary | ICD-10-CM | POA: Diagnosis not present

## 2015-03-01 DIAGNOSIS — R109 Unspecified abdominal pain: Secondary | ICD-10-CM | POA: Diagnosis not present

## 2015-03-01 LAB — TSH: TSH: 0.19 u[IU]/mL — ABNORMAL LOW (ref 0.35–4.50)

## 2015-03-01 LAB — COMPREHENSIVE METABOLIC PANEL
ALT: 9 U/L (ref 0–35)
AST: 10 U/L (ref 0–37)
Albumin: 3.9 g/dL (ref 3.5–5.2)
Alkaline Phosphatase: 69 U/L (ref 39–117)
BUN: 17 mg/dL (ref 6–23)
CO2: 21 mEq/L (ref 19–32)
Calcium: 9.5 mg/dL (ref 8.4–10.5)
Chloride: 104 mEq/L (ref 96–112)
Creatinine, Ser: 1.3 mg/dL — ABNORMAL HIGH (ref 0.40–1.20)
GFR: 52.87 mL/min — ABNORMAL LOW (ref >60.00)
Glucose, Bld: 175 mg/dL — ABNORMAL HIGH (ref 70–99)
Potassium: 4.3 mEq/L (ref 3.5–5.1)
Sodium: 134 mEq/L — ABNORMAL LOW (ref 135–145)
Total Bilirubin: 1 mg/dL (ref 0.2–1.2)
Total Protein: 7.3 g/dL (ref 6.0–8.3)

## 2015-03-01 LAB — LIPID PANEL
Cholesterol: 171 mg/dL (ref 0–200)
HDL: 61.1 mg/dL (ref >39.00)
LDL Cholesterol: 93 mg/dL (ref 0–99)
NonHDL: 109.9
Total CHOL/HDL Ratio: 3
Triglycerides: 86 mg/dL (ref 0.0–149.0)
VLDL: 17.2 mg/dL (ref 0.0–40.0)

## 2015-03-01 LAB — HEMOGLOBIN A1C: Hgb A1c MFr Bld: 5.7 % (ref 4.6–6.5)

## 2015-03-01 LAB — T4, FREE: Free T4: 1.06 ng/dL (ref 0.60–1.60)

## 2015-03-01 NOTE — Progress Notes (Signed)
Quick Note:  Please let patient know that the A1c is good but thyroid level is slightly high. She needs to take the thyroid medication 6 days a week and a half tablet on Sundays  ______

## 2015-03-01 NOTE — Patient Instructions (Signed)
Novolog 10 units at lunch and 12 at supper  Take 2 Glimeperide until am sugar < 120  Check blood sugars on waking up ..  .. times a week Also check blood sugars about 2 hours after a meal and do this after different meals by rotation  Recommended blood sugar levels on waking up is 90-130 and about 2 hours after meal is 140-180 Please bring blood sugar monitor to each visit.

## 2015-03-01 NOTE — Progress Notes (Signed)
[name] is a 64 y.o. female.    Reason for Appointment: Diabetes follow-up   History of Present Illness   Diagnosis: Type 2 DIABETES MELITUS, date of diagnosis: 2000  Previous history: She had initially been treated with metformin and Amaryl and subsequently given Victoza which helped overall control and weight loss. In 9/13 because of significant hyperglycemia she was started on basal bolus insulin also. Had required relatively small doses of insulin. Her blood sugar control has been excellent with A1c between 5.3-6.0 although this may be relatively higher than expected. Tends to have relatively higher readings after supper In 6/15 she was told to resume her Victoza and stop her Lantus since she had been gaining weight with Lantus and NovoLog regimen.   Recent history:  upto 350 She appears to have had much higher blood sugars over the last week because of getting a steroid injection However she did not bring her blood sugar meter for download She continues to take Victoza 1.2 along with insulin and Amaryl However she says that because of not feeling well she stopped taking her Novolog in the last week.  This is despite her blood sugars being high Current blood sugar patterns and management:  Fasting blood sugars are mostly near normal although she thinks that since she had a steroid injection they have been in the 190 range  She is rarely checking blood sugars after meals despite instructions  She was also told to increase her suppertime Novolog if postprandial readings were higher; however since she is not doing any readings after supper she has not adjusted her dose  She has not had an A1c this year  Recently having difficulty with sciatica and not active at all.  Glucose in the office today still about 170 fasting    Oral hypoglycemic drugs: Amaryl  1 mg    Side effects from medications:  none  INSULIN:  0 Novolog ac bid recently Monitors blood glucose:  Once a day.    Glucometer:  Accu-Chek compact.          Blood Glucose readings from recall:  PRE-MEAL Breakfast Lunch Dinner Bedtime Overall  Glucose range: 90- 190   191   Mean/median:        Hypoglycemia: None    Meals: 1-3 meals per day. Supper at 5-7 PM pm          Physical activity: exercise: Limited, has had fatigue, back and leg pain.           Dietician visit: Most recent: [id number]           Complications: are: None  Wt Readings from Last 3 Encounters:  03/01/15 245 lb 12.8 oz (111.494 kg)  11/14/14 252 lb (114.306 kg)  11/10/14 246 lb (111.585 kg)    LABS:  Lab Results  Component Value Date   HGBA1C 5.7 03/01/2015   HGBA1C 6.4 09/26/2014   HGBA1C 5.7 04/06/2014   Lab Results  Component Value Date   MICROALBUR 0.4 02/23/2014   LDLCALC 93 03/01/2015   CREATININE 1.30* 03/01/2015    Appointment on 03/01/2015  Component Date Value Ref Range Status  . Hgb A1c MFr Bld 03/01/2015 5.7  4.6 - 6.5 % Final   Glycemic Control Guidelines for People with Diabetes:Non Diabetic:  <6%Goal of Therapy: <7%Additional Action Suggested:  >8%   . Sodium 03/01/2015 134* 135 - 145 mEq/L Final  . Potassium 03/01/2015 4.3  3.5 - 5.1 mEq/L Final  . Chloride 03/01/2015 104  96 - 112 mEq/L Final  . CO2 03/01/2015 21  19 - 32 mEq/L Final  . Glucose, Bld 03/01/2015 175* 70 - 99 mg/dL Final  . BUN 03/01/2015 17  6 - 23 mg/dL Final  . Creatinine, Ser 03/01/2015 1.30* 0.40 - 1.20 mg/dL Final  . Total Bilirubin 03/01/2015 1.0  0.2 - 1.2 mg/dL Final  . Alkaline Phosphatase 03/01/2015 69  39 - 117 U/L Final  . AST 03/01/2015 10  0 - 37 U/L Final  . ALT 03/01/2015 9  0 - 35 U/L Final  . Total Protein 03/01/2015 7.3  6.0 - 8.3 g/dL Final  . Albumin 03/01/2015 3.9  3.5 - 5.2 g/dL Final  . Calcium 03/01/2015 9.5  8.4 - 10.5 mg/dL Final  . GFR 03/01/2015 52.87* >60.00 mL/min Final  . Cholesterol 03/01/2015 171  0 - 200 mg/dL Final   ATP III Classification       Desirable:  < 200 mg/dL                Borderline High:  200 - 239 mg/dL          High:  > = 240 mg/dL  . Triglycerides 03/01/2015 86.0  0.0 - 149.0 mg/dL Final   Normal:  <150 mg/dLBorderline High:  150 - 199 mg/dL  . HDL 03/01/2015 61.10  >39.00 mg/dL Final  . VLDL 03/01/2015 17.2  0.0 - 40.0 mg/dL Final  . LDL Cholesterol 03/01/2015 93  0 - 99 mg/dL Final  . Total CHOL/HDL Ratio 03/01/2015 3   Final                  Men          Women1/2 Average Risk     3.4          3.3Average Risk          5.0          4.42X Average Risk          9.6          7.13X Average Risk          15.0          11.0                      . NonHDL 03/01/2015 109.90   Final   NOTE:  Non-HDL goal should be 30 mg/dL higher than patient's LDL goal (i.e. LDL goal of < 70 mg/dL, would have non-HDL goal of < 100 mg/dL)      Medication List       This list is accurate as of: 03/01/15  5:14 PM.  Always use your most recent med list.               AMBULATORY NON FORMULARY MEDICATION  Domperidone 10 mg    Take one tablet by mouth twice a day before meals     amLODipine-valsartan 10-320 MG per tablet  Commonly known as:  EXFORGE  Take 1 tablet by mouth daily.     aspirin 81 MG EC tablet  Take 81 mg by mouth daily.     Diclofenac Sodium 2 % Soln  Commonly known as:  PENNSAID  Place 2 application onto the skin 2 (two) times daily.     ELMIRON 100 MG capsule  Generic drug:  pentosan polysulfate  Take 100 mg by mouth 2 (two) times daily.     estradiol 1 MG tablet  Commonly known as:  ESTRACE  TAKE 1 TABLET (1 MG TOTAL) BY MOUTH DAILY.     FLUoxetine 20 MG capsule  Commonly known as:  PROZAC     FLUoxetine 20 MG capsule  Commonly known as:  PROZAC  TAKE 1 CAPSULE (20 MG TOTAL) BY MOUTH DAILY.     furosemide 40 MG tablet  Commonly known as:  LASIX  Take 1 tablet (40 mg total) by mouth daily.     gabapentin 600 MG tablet  Commonly known as:  NEURONTIN  Take 600 mg by mouth 2 (two) times daily.     glimepiride 1 MG tablet  Commonly known  as:  AMARYL  TAKE 1 TABLET BY MOUTH WITH SUPPER ONCE A DAY PER DR. Dwyane Dee     glucose blood test strip  Commonly known as:  ACCU-CHEK COMPACT PLUS  Use as instructed to check blood sugar 2 times per day dx code E11.9     HYDROcodone-acetaminophen 7.5-325 MG per tablet  Commonly known as:  NORCO  Take 1 tablet by mouth every 6 (six) hours as needed for moderate pain. (cough)     isosorbide dinitrate 10 MG tablet  Commonly known as:  ISORDIL  TAKE ONE TABLET BY MOUTH DAILY     levothyroxine 100 MCG tablet  Commonly known as:  SYNTHROID, LEVOTHROID  TAKE 1 TABLET (100 MCG TOTAL) BY MOUTH DAILY.     Linaclotide 145 MCG Caps capsule  Commonly known as:  LINZESS  Take 1 capsule (145 mcg total) by mouth every other day.     metoprolol 50 MG tablet  Commonly known as:  LOPRESSOR  Take one and one-half tablets twice a day     nitrofurantoin 100 MG capsule  Commonly known as:  MACRODANTIN     NOVOLOG FLEXPEN 100 UNIT/ML FlexPen  Generic drug:  insulin aspart  Inject 10 Units into the skin 3 (three) times daily with meals.     omeprazole 20 MG capsule  Commonly known as:  PRILOSEC  Take 1 capsule (20 mg total) by mouth daily.     ondansetron 4 MG tablet  Commonly known as:  ZOFRAN  Take 1 tab every 6 hours as needed for nausea.     phenazopyridine 95 MG tablet  Commonly known as:  PYRIDIUM  Take 1 tablet (95 mg total) by mouth 3 (three) times daily as needed for pain.     simvastatin 20 MG tablet  Commonly known as:  ZOCOR  TAKE 1 TABLET BY MOUTH DAILY     simvastatin 20 MG tablet  Commonly known as:  ZOCOR  TAKE 1 TABLET BY MOUTH DAILY     sucralfate 1 GM/10ML suspension  Commonly known as:  CARAFATE  Take 2 teaspoons three times daily before meals.     trimethoprim 100 MG tablet  Commonly known as:  TRIMPEX  Take 100 mg by mouth daily.     venlafaxine 37.5 MG tablet  Commonly known as:  EFFEXOR  TAKE 2 TABLETS (75 MG TOTAL) BY MOUTH 2 (TWO) TIMES DAILY WITH A  MEAL.     VICTOZA 18 MG/3ML Sopn  Generic drug:  Liraglutide  Inject 1.2 mg into the skin daily. Inject once daily at the same time        Allergies:  Allergies  Allergen Reactions  . Hydrocodone Other (See Comments)    tachycardia  . Ciprofloxacin Other (See Comments)    dizzy  . Penicillins Hives    Past Medical History  Diagnosis Date  .  COMMON MIGRAINE   . DIVERTICULOSIS-COLON   . CORONARY ARTERY DISEASE   . Gastroparesis   . INSOMNIA-SLEEP DISORDER-UNSPEC   . Iron deficiency anemia   . Anxiety   . DEPRESSION   . Diabetes mellitus, type II   . GERD (gastroesophageal reflux disease)   . Hyperlipidemia   . Hypertension   . Gastric ulcer 04/2008  . CERVICAL RADICULOPATHY, LEFT   . Hiatal hernia   . Hypothyroidism   . Arthritis     Past Surgical History  Procedure Laterality Date  . Left knee replacement    . Rigth knee replacement with revision  04/2008    Dr. Berenice Primas  . Gastric wedge resection lipoma  11/2007    x2 with laparotomy and gastrostomy  . Rotator cuff repair  01/2009  . Cholecystectomy  06/2009  . S/p bladder surgury  09/2009    Dr. Terance Hart  . Abdominal hysterectomy      Family History  Problem Relation Age of Onset  . Diabetes Sister     x 3  . Heart disease Sister     x2  . Diabetes Brother     x3  . Heart disease Brother     x2  . Coronary artery disease Other     female 1st degree  . Hypertension Other   . Colon cancer Neg Hx     Social History:  reports that she has quit smoking. She has never used smokeless tobacco. She reports that she does not drink alcohol or use illicit drugs.  Review of Systems:  Hypertension: Her blood pressure is treated with metoprolol and Exforge Not monitoring blood pressure at home Blood pressure is better today  She tends to have relatively fast heart rate of unclear etiology, possibly autonomic neuropathy  Mild CKD: Her creatinine is recently persistently higher and she apparently has seen a  nephrologist Not clear if no specific diagnosis has been made   Lab Results  Component Value Date   CREATININE 1.30* 03/01/2015   BUN 17 03/01/2015   NA 134* 03/01/2015   K 4.3 03/01/2015   CL 104 03/01/2015   CO2 21 03/01/2015    Lipids: Usually well controlled with simvastatin but needs follow-up levels  Lab Results  Component Value Date   CHOL 171 03/01/2015   HDL 61.10 03/01/2015   LDLCALC 93 03/01/2015   LDLDIRECT 156.6 01/07/2011   TRIG 86.0 03/01/2015   CHOLHDL 3 03/01/2015     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      Examination:   BP 130/78 mmHg  Pulse 87  Temp(Src) 98.2 F (36.8 C)  Resp 16  Ht 5' 5.5" (1.664 m)  Wt 245 lb 12.8 oz (111.494 kg)  BMI 40.27 kg/m2  SpO2 95%  Body mass index is 40.27 kg/(m^2).   Repeat blood pressure with large cuff standing 130/78  No leg edema present  Biceps reflexes difficult to elicit  ASSESSMENT/ PLAN:   Diabetes type 2, uncontrolled :   Blood glucose control is difficult to assess as she has not had an A1c recently Also does not check readings after meals and mostly in the morning Despite repeated instructions on how to manage her diabetes from day to day she is usually not focusing on this Generally fasting blood sugars are controlled with her Victoza and low dose Amaryl but tends to have higher postprandial readings Again discussed with  patient need for monitoring at various times, taking Novolog consistently with meals and adjusting the dose based on what she is eating and how high the postprandial readings are; discussed sick day rules for increasing Novolog when her blood sugars are high including recently when she had a steroid injection  Recommendations made:  Monitor blood sugars as discussed above  Temporarily increase Amaryl to twice the dose at bedtime  Restart Novolog and adjust the dose as above  Hypothyroidism: TSH needs to be rechecked with her new dose of 100 g that she has been taking for 3 months Difficult to assess subjectively as she has fatigue from other causes  HYPERTENSION: Blood pressure is  controlled with current regimen  Renal insufficiency: Creatinine to be rechecked today.  Will review reports from nephrologist when available  Counseling time on subjects discussed above is over 50% of today's 25 minute visit  Patient Instructions  Novolog 10 units at lunch and 12 at supper  Take 2 Glimeperide until am sugar < 120  Check blood sugars on waking up ..  .. times a week Also check blood sugars about 2 hours after a meal and do this after different meals by rotation  Recommended blood sugar levels on waking up is 90-130 and about 2 hours after meal is 140-180 Please bring blood sugar monitor to each visit.      Cortlin Marano 03/01/2015, 5:14 PM

## 2015-03-14 DIAGNOSIS — M7061 Trochanteric bursitis, right hip: Secondary | ICD-10-CM | POA: Diagnosis not present

## 2015-03-14 DIAGNOSIS — M545 Low back pain: Secondary | ICD-10-CM | POA: Diagnosis not present

## 2015-03-24 ENCOUNTER — Ambulatory Visit (INDEPENDENT_AMBULATORY_CARE_PROVIDER_SITE_OTHER): Payer: Medicare Other | Admitting: Internal Medicine

## 2015-03-24 ENCOUNTER — Other Ambulatory Visit (INDEPENDENT_AMBULATORY_CARE_PROVIDER_SITE_OTHER): Payer: Medicare Other

## 2015-03-24 ENCOUNTER — Encounter: Payer: Self-pay | Admitting: Internal Medicine

## 2015-03-24 VITALS — BP 136/70 | HR 91 | Temp 98.1°F | Resp 20 | Wt 254.0 lb

## 2015-03-24 DIAGNOSIS — R252 Cramp and spasm: Secondary | ICD-10-CM

## 2015-03-24 LAB — CK: Total CK: 64 U/L (ref 7–177)

## 2015-03-24 LAB — SEDIMENTATION RATE: Sed Rate: 35 mm/hr — ABNORMAL HIGH (ref 0–22)

## 2015-03-24 LAB — BASIC METABOLIC PANEL
BUN: 27 mg/dL — ABNORMAL HIGH (ref 6–23)
CO2: 22 mEq/L (ref 19–32)
Calcium: 9 mg/dL (ref 8.4–10.5)
Chloride: 109 mEq/L (ref 96–112)
Creatinine, Ser: 1.63 mg/dL — ABNORMAL HIGH (ref 0.40–1.20)
GFR: 40.71 mL/min — ABNORMAL LOW (ref >60.00)
Glucose, Bld: 60 mg/dL — ABNORMAL LOW (ref 70–99)
Potassium: 4 mEq/L (ref 3.5–5.1)
Sodium: 138 mEq/L (ref 135–145)

## 2015-03-24 LAB — MAGNESIUM: Magnesium: 2 mg/dL (ref 1.5–2.5)

## 2015-03-24 NOTE — Progress Notes (Signed)
Pre visit review using our clinic review tool, if applicable. No additional management support is needed unless otherwise documented below in the visit note. 

## 2015-03-24 NOTE — Patient Instructions (Signed)
  Your next office appointment will be determined based upon review of your pending labs.  Those written interpretation of the lab results and instructions will be transmitted to you by mail for your records.  Critical results will be called.   Followup as needed for any active or acute issue. Please report any significant change in your symptoms.  Please perform isometric exercises before going to bed. Sit on side of the bed and raise up on toes to a count of 5. Then put pressure on the heels to a count of 5. Repeat this process 10 times. This will improve blood flow to the calves & help prevent cramps. Magcal  (or CalMag)may help the muscle symptoms as we discussed.

## 2015-03-24 NOTE — Progress Notes (Signed)
   Subjective:    Patient ID: [name], female    DOB: Mar 01, 1951, 64 y.o.   MRN: [id number]  HPI For 2 weeks she describes cramping in her hands, calves, and toes without specific pedisposition such as repetitive motion. The symptoms can last up to 20 minutes. Massage by her husband has helped. She's also used ice and heat.  Significant past history includes tenosynovitis of the left thumb. She's also had knee replacement and rotator cuff repair.  She is not having joint redness or swelling.  She is on a statin as well as diarrhetic therapy with furosemide.   Review of Systems Fever, chills, sweats, or unexplained weight loss not present. There is chronic unrelated RLE numbness.No tingling or weakness in extremities.   No loss of control of bladder or bowels. Radicular type pain absent.       Objective:   Physical Exam  Pertinent or positive findings include: BMI: 41.61 She has a grade 1 systolic murmur. 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 Degenerative reflexes are 0+ in the right knee and 1/2+ on the left.  Posterior tibial pulses are decreased. Pes planus present. Decreased sensation to light touch over the heels of the soles.  General appearance :adequately nourished; in no distress.  Eyes: No conjunctival inflammation or scleral icterus is present.  Oral exam:  Lips and gums are healthy appearing.There is no oropharyngeal erythema or exudate noted. Dental hygiene is good.  Heart:  Normal rate and regular rhythm. S1 and S2 normal without gallop,click, rub or other extra sounds    Lungs:Chest clear to auscultation; no wheezes, rhonchi,rales ,or rubs present.No increased work of breathing.   Abdomen: bowel sounds normal, soft and non-tender without masses, organomegaly or hernias noted.  No guarding or rebound. No flank tenderness to percussion.  Vascular : all pulses equal ; no bruits present.  Skin:Warm & dry.  Intact without suspicious  lesions or rashes ; no tenting   Lymphatic: No lymphadenopathy is noted about the head, neck, axilla   Neuro: Strength, tone normal.        Assessment & Plan:  #1 cramping pains in her feet in the context of statin and diuretic therapy  Plan: See orders and recommendations

## 2015-03-27 ENCOUNTER — Other Ambulatory Visit: Payer: Self-pay | Admitting: Internal Medicine

## 2015-03-27 DIAGNOSIS — N182 Chronic kidney disease, stage 2 (mild): Secondary | ICD-10-CM

## 2015-03-30 DIAGNOSIS — H2513 Age-related nuclear cataract, bilateral: Secondary | ICD-10-CM | POA: Diagnosis not present

## 2015-03-30 DIAGNOSIS — E11331 Type 2 diabetes mellitus with moderate nonproliferative diabetic retinopathy with macular edema: Secondary | ICD-10-CM | POA: Diagnosis not present

## 2015-03-30 DIAGNOSIS — H1045 Other chronic allergic conjunctivitis: Secondary | ICD-10-CM | POA: Diagnosis not present

## 2015-03-30 DIAGNOSIS — H5203 Hypermetropia, bilateral: Secondary | ICD-10-CM | POA: Diagnosis not present

## 2015-03-30 DIAGNOSIS — H524 Presbyopia: Secondary | ICD-10-CM | POA: Diagnosis not present

## 2015-03-30 LAB — HM DIABETES EYE EXAM

## 2015-04-03 DIAGNOSIS — N183 Chronic kidney disease, stage 3 (moderate): Secondary | ICD-10-CM | POA: Diagnosis not present

## 2015-04-03 DIAGNOSIS — I10 Essential (primary) hypertension: Secondary | ICD-10-CM | POA: Diagnosis not present

## 2015-04-06 ENCOUNTER — Encounter: Payer: Self-pay | Admitting: Internal Medicine

## 2015-04-21 DIAGNOSIS — M25551 Pain in right hip: Secondary | ICD-10-CM | POA: Diagnosis not present

## 2015-04-27 DIAGNOSIS — M25551 Pain in right hip: Secondary | ICD-10-CM | POA: Diagnosis not present

## 2015-05-03 ENCOUNTER — Other Ambulatory Visit: Payer: Self-pay | Admitting: Endocrinology

## 2015-05-03 DIAGNOSIS — M25551 Pain in right hip: Secondary | ICD-10-CM | POA: Diagnosis not present

## 2015-05-09 ENCOUNTER — Other Ambulatory Visit: Payer: Self-pay | Admitting: Endocrinology

## 2015-05-25 ENCOUNTER — Ambulatory Visit (INDEPENDENT_AMBULATORY_CARE_PROVIDER_SITE_OTHER): Payer: Medicare Other | Admitting: Internal Medicine

## 2015-05-25 ENCOUNTER — Telehealth: Payer: Self-pay | Admitting: Endocrinology

## 2015-05-25 ENCOUNTER — Other Ambulatory Visit: Payer: Self-pay | Admitting: *Deleted

## 2015-05-25 ENCOUNTER — Encounter: Payer: Self-pay | Admitting: Internal Medicine

## 2015-05-25 VITALS — BP 118/70 | HR 83 | Temp 98.2°F | Ht 65.0 in | Wt 253.0 lb

## 2015-05-25 DIAGNOSIS — I10 Essential (primary) hypertension: Secondary | ICD-10-CM

## 2015-05-25 DIAGNOSIS — E119 Type 2 diabetes mellitus without complications: Secondary | ICD-10-CM | POA: Diagnosis not present

## 2015-05-25 DIAGNOSIS — M545 Low back pain: Secondary | ICD-10-CM | POA: Diagnosis not present

## 2015-05-25 DIAGNOSIS — M5416 Radiculopathy, lumbar region: Secondary | ICD-10-CM

## 2015-05-25 MED ORDER — HYDROCODONE-ACETAMINOPHEN 7.5-325 MG PO TABS: 1.0000 | ORAL_TABLET | Freq: Four times a day (QID) | ORAL | Status: DC | PRN

## 2015-05-25 MED ORDER — PREDNISONE 10 MG PO TABS: ORAL_TABLET | ORAL | Status: DC

## 2015-05-25 MED ORDER — GLUCOSE BLOOD VI STRP: ORAL_STRIP | Status: DC

## 2015-05-25 NOTE — Progress Notes (Addendum)
Subjective:    Patient ID: [name], female    DOB: 28-Feb-1951, 64 y.o.   MRN: [id number]  HPI  Here to f/um has seen Dr Berenice Primas and Dr Jacelyn Grip recently with MRI and what sounds like persistent pain and RLE weakness due to right lumbar radiculopathy; is s/p ESI, patient here for further discussion to see if any other medical options and she has put off disc surgury to date; pain often 7-9 10, worse in the AM to first get out of bed, has stumbled several times, no recent falls.Pt denies chest pain, increased sob or doe, wheezing, orthopnea, PND, increased LE swelling, palpitations, dizziness or syncope.  Pt denies polydipsia, polyuria  Past Medical History  Diagnosis Date  . COMMON MIGRAINE   . DIVERTICULOSIS-COLON   . CORONARY ARTERY DISEASE   . Gastroparesis   . INSOMNIA-SLEEP DISORDER-UNSPEC   . Iron deficiency anemia   . Anxiety   . DEPRESSION   . Diabetes mellitus, type II   . GERD (gastroesophageal reflux disease)   . Hyperlipidemia   . Hypertension   . Gastric ulcer 04/2008  . CERVICAL RADICULOPATHY, LEFT   . Hiatal hernia   . Hypothyroidism   . Arthritis    Past Surgical History  Procedure Laterality Date  . Left knee replacement    . Rigth knee replacement with revision  04/2008    Dr. Berenice Primas  . Gastric wedge resection lipoma  11/2007    x2 with laparotomy and gastrostomy  . Rotator cuff repair  01/2009  . Cholecystectomy  06/2009  . S/p bladder surgury  09/2009    Dr. Terance Hart  . Abdominal hysterectomy      reports that she has quit smoking. She has never used smokeless tobacco. She reports that she does not drink alcohol or use illicit drugs. family history includes Coronary artery disease in her other; Diabetes in her brother and sister; Heart disease in her brother and sister; Hypertension in her other. There is no history of Colon cancer. Allergies  Allergen Reactions  . Hydrocodone Other (See Comments)    tachycardia  . Ciprofloxacin Other (See Comments)   dizzy  . Penicillins Hives   Current Outpatient Prescriptions on File Prior to Visit  Medication Sig Dispense Refill  . ACCU-CHEK COMPACT PLUS test strip USE AS INSTRUCTED TO CHECK BLOOD SUGARS ONCE A DAY 100 each 1  . AMBULATORY NON FORMULARY MEDICATION Domperidone 10 mg    Take one tablet by mouth twice a day before meals 120 tablet 3  . amLODipine-valsartan (EXFORGE) 10-320 MG per tablet Take 1 tablet by mouth daily. 30 tablet 5  . aspirin 81 MG EC tablet Take 81 mg by mouth daily.      Marland Kitchen ELMIRON 100 MG capsule Take 100 mg by mouth 2 (two) times daily.     Marland Kitchen estradiol (ESTRACE) 1 MG tablet TAKE 1 TABLET (1 MG TOTAL) BY MOUTH DAILY. 90 tablet 3  . FLUoxetine (PROZAC) 20 MG capsule TAKE 1 CAPSULE (20 MG TOTAL) BY MOUTH DAILY. 90 capsule 1  . furosemide (LASIX) 40 MG tablet Take 1 tablet (40 mg total) by mouth daily.    Marland Kitchen gabapentin (NEURONTIN) 600 MG tablet Take 600 mg by mouth 2 (two) times daily.   1  . glimepiride (AMARYL) 1 MG tablet TAKE 1 TABLET BY MOUTH WITH SUPPER ONCE A DAY PER DR. Dwyane Dee 30 tablet 3  . HYDROcodone-acetaminophen (NORCO) 7.5-325 MG per tablet Take 1 tablet by mouth every 6 (six) hours  as needed for moderate pain. (cough) 60 tablet 0  . insulin aspart (NOVOLOG FLEXPEN) 100 UNIT/ML FlexPen Inject 10 Units into the skin 3 (three) times daily with meals.    . isosorbide dinitrate (ISORDIL) 10 MG tablet TAKE ONE TABLET BY MOUTH DAILY 90 tablet 1  . levothyroxine (SYNTHROID, LEVOTHROID) 100 MCG tablet TAKE 1 TABLET (100 MCG TOTAL) BY MOUTH DAILY. 30 tablet 3  . Linaclotide (LINZESS) 145 MCG CAPS capsule Take 1 capsule (145 mcg total) by mouth every other day. 15 capsule 5  . metoprolol (LOPRESSOR) 50 MG tablet Take one and one-half tablets twice a day 90 tablet 11  . nitrofurantoin (MACRODANTIN) 100 MG capsule     . omeprazole (PRILOSEC) 20 MG capsule Take 1 capsule (20 mg total) by mouth daily. 30 capsule 11  . ondansetron (ZOFRAN) 4 MG tablet Take 1 tab every 6 hours as  needed for nausea. 30 tablet 2  . simvastatin (ZOCOR) 20 MG tablet TAKE 1 TABLET BY MOUTH DAILY 90 tablet 1  . sucralfate (CARAFATE) 1 GM/10ML suspension Take 2 teaspoons three times daily before meals. 5400 mL 5  . trimethoprim (TRIMPEX) 100 MG tablet Take 100 mg by mouth daily.   1  . VICTOZA 18 MG/3ML SOPN Inject 1.2 mg into the skin daily. Inject once daily at the same time 2 pen 3   No current facility-administered medications on file prior to visit.   Review of Systems  Constitutional: Negative for unusual diaphoresis or night sweats HENT: Negative for ringing in ear or discharge Eyes: Negative for double vision or worsening visual disturbance.  Respiratory: Negative for choking and stridor.   Gastrointestinal: Negative for vomiting or other signifcant bowel change Genitourinary: Negative for hematuria or change in urine volume.  Musculoskeletal: Negative for other MSK pain or swelling Skin: Negative for color change and worsening wound.  Neurological: Negative for tremors and numbness other than noted  Psychiatric/Behavioral: Negative for decreased concentration or agitation other than above       Objective:   Physical Exam .BP 118/70 mmHg  Pulse 83  Temp(Src) 98.2 F (36.8 C) (Oral)  Ht 5\' 5"  (1.651 m)  Wt 253 lb (114.76 kg)  BMI 42.10 kg/m2  SpO2 97% VS noted,  Constitutional: Pt appears in no significant distress HENT: Head: NCAT.  Right Ear: External ear normal.  Left Ear: External ear normal.  Eyes: . Pupils are equal, round, and reactive to light. Conjunctivae and EOM are normal Neck: Normal range of motion. Neck supple.  Cardiovascular: Normal rate and regular rhythm.   Pulmonary/Chest: Effort normal and breath sounds without rales or wheezing.  Abd:  Soft, NT, ND, + BS Neurological: Pt is alert. Not confused , motor grossly intact except 4/5 RLE Spine tender midline and right paravertebralk Skin: Skin is warm. No rash, no LE edema Psychiatric: Pt behavior is  normal. No agitation.       Assessment & Plan:

## 2015-05-25 NOTE — Progress Notes (Signed)
Pre visit review using our clinic review tool, if applicable. No additional management support is needed unless otherwise documented below in the visit note. 

## 2015-05-25 NOTE — Telephone Encounter (Signed)
Patient called stating that her pharmacy needs a ICD-10 code for her RX  Rx: Accu Check compact strips   Pharmacy: [facility] [address]   Thank you

## 2015-05-25 NOTE — Patient Instructions (Signed)
.  Please continue all other medications as before, and refills have been done if requested. - the pain medication, and prednisone  Please have the pharmacy call with any other refills you may need.  Please continue your efforts at being more active, low cholesterol diet, and weight control.  Please keep your appointments with your specialists as you may have planned

## 2015-05-25 NOTE — Assessment & Plan Note (Signed)
stable overall by history and exam, recent data reviewed with pt, and pt to continue medical treatment as before,  to f/u any worsening symptoms or concern BP Readings from Last 3 Encounters:  05/25/15 118/70  03/24/15 136/70  03/01/15 130/78

## 2015-05-25 NOTE — Assessment & Plan Note (Signed)
stable overall by history and exam, recent data reviewed with pt, and pt to continue medical treatment as before,  to f/u any worsening symptoms or concerns BP Readings from Last 3 Encounters:  05/25/15 118/70  03/24/15 136/70  03/01/15 130/78

## 2015-05-25 NOTE — Assessment & Plan Note (Signed)
[place] for pain med refill, and predpac asd, but d/w pt that this would be only temp help, and surgury is likely her best option for more long term tx

## 2015-05-29 DIAGNOSIS — I2510 Atherosclerotic heart disease of native coronary artery without angina pectoris: Secondary | ICD-10-CM | POA: Diagnosis not present

## 2015-05-29 DIAGNOSIS — E785 Hyperlipidemia, unspecified: Secondary | ICD-10-CM | POA: Diagnosis not present

## 2015-05-29 DIAGNOSIS — E669 Obesity, unspecified: Secondary | ICD-10-CM | POA: Diagnosis not present

## 2015-05-29 DIAGNOSIS — I10 Essential (primary) hypertension: Secondary | ICD-10-CM | POA: Diagnosis not present

## 2015-05-29 DIAGNOSIS — E119 Type 2 diabetes mellitus without complications: Secondary | ICD-10-CM | POA: Diagnosis not present

## 2015-05-29 DIAGNOSIS — R002 Palpitations: Secondary | ICD-10-CM | POA: Diagnosis not present

## 2015-05-29 DIAGNOSIS — E039 Hypothyroidism, unspecified: Secondary | ICD-10-CM | POA: Diagnosis not present

## 2015-06-01 ENCOUNTER — Ambulatory Visit: Payer: Medicare Other | Admitting: Endocrinology

## 2015-06-06 DIAGNOSIS — M5441 Lumbago with sciatica, right side: Secondary | ICD-10-CM | POA: Diagnosis not present

## 2015-06-06 DIAGNOSIS — M545 Low back pain: Secondary | ICD-10-CM | POA: Diagnosis not present

## 2015-06-14 DIAGNOSIS — M545 Low back pain: Secondary | ICD-10-CM | POA: Diagnosis not present

## 2015-06-20 DIAGNOSIS — M545 Low back pain: Secondary | ICD-10-CM | POA: Diagnosis not present

## 2015-06-20 DIAGNOSIS — M4806 Spinal stenosis, lumbar region: Secondary | ICD-10-CM | POA: Diagnosis not present

## 2015-06-24 ENCOUNTER — Other Ambulatory Visit: Payer: Self-pay | Admitting: Internal Medicine

## 2015-06-26 ENCOUNTER — Telehealth: Payer: Self-pay | Admitting: Internal Medicine

## 2015-06-26 DIAGNOSIS — M4807 Spinal stenosis, lumbosacral region: Secondary | ICD-10-CM | POA: Diagnosis not present

## 2015-06-26 DIAGNOSIS — N959 Unspecified menopausal and perimenopausal disorder: Secondary | ICD-10-CM

## 2015-06-26 NOTE — Telephone Encounter (Signed)
Pt husband called in and said that pt is burning up and wants her estrogen increased    Best number [place] finally

## 2015-06-27 ENCOUNTER — Other Ambulatory Visit: Payer: Self-pay

## 2015-06-27 ENCOUNTER — Encounter: Payer: Self-pay | Admitting: Endocrinology

## 2015-06-27 ENCOUNTER — Ambulatory Visit (INDEPENDENT_AMBULATORY_CARE_PROVIDER_SITE_OTHER): Payer: Medicare Other | Admitting: Endocrinology

## 2015-06-27 VITALS — BP 132/74 | HR 75 | Temp 98.7°F | Ht 65.0 in | Wt 255.0 lb

## 2015-06-27 DIAGNOSIS — N183 Chronic kidney disease, stage 3 unspecified: Secondary | ICD-10-CM

## 2015-06-27 DIAGNOSIS — M5416 Radiculopathy, lumbar region: Secondary | ICD-10-CM | POA: Diagnosis not present

## 2015-06-27 DIAGNOSIS — E038 Other specified hypothyroidism: Secondary | ICD-10-CM | POA: Diagnosis not present

## 2015-06-27 DIAGNOSIS — E1165 Type 2 diabetes mellitus with hyperglycemia: Secondary | ICD-10-CM | POA: Diagnosis not present

## 2015-06-27 DIAGNOSIS — IMO0002 Reserved for concepts with insufficient information to code with codable children: Secondary | ICD-10-CM

## 2015-06-27 DIAGNOSIS — E890 Postprocedural hypothyroidism: Secondary | ICD-10-CM

## 2015-06-27 LAB — COMPREHENSIVE METABOLIC PANEL
ALT: 9 U/L (ref 0–35)
AST: 14 U/L (ref 0–37)
Albumin: 3.7 g/dL (ref 3.5–5.2)
Alkaline Phosphatase: 58 U/L (ref 39–117)
BUN: 30 mg/dL — ABNORMAL HIGH (ref 6–23)
CO2: 24 mEq/L (ref 19–32)
Calcium: 9.3 mg/dL (ref 8.4–10.5)
Chloride: 107 mEq/L (ref 96–112)
Creatinine, Ser: 1.93 mg/dL — ABNORMAL HIGH (ref 0.40–1.20)
GFR: 33.47 mL/min — ABNORMAL LOW (ref >60.00)
Glucose, Bld: 104 mg/dL — ABNORMAL HIGH (ref 70–99)
Potassium: 4.9 mEq/L (ref 3.5–5.1)
Sodium: 137 mEq/L (ref 135–145)
Total Bilirubin: 0.9 mg/dL (ref 0.2–1.2)
Total Protein: 7.1 g/dL (ref 6.0–8.3)

## 2015-06-27 LAB — MICROALBUMIN / CREATININE URINE RATIO
Creatinine,U: 234.1 mg/dL
Microalb Creat Ratio: 0.3 mg/g (ref 0.0–30.0)
Microalb, Ur: <0.7 mg/dL (ref 0.0–1.9)

## 2015-06-27 LAB — T4, FREE: Free T4: 0.8 ng/dL (ref 0.60–1.60)

## 2015-06-27 LAB — HEMOGLOBIN A1C: Hgb A1c MFr Bld: 6.7 % — ABNORMAL HIGH (ref 4.6–6.5)

## 2015-06-27 LAB — TSH: TSH: 0.19 u[IU]/mL — ABNORMAL LOW (ref 0.35–4.50)

## 2015-06-27 MED ORDER — CLONIDINE HCL 0.1 MG/24HR TD PTWK: 0.1000 mg | MEDICATED_PATCH | TRANSDERMAL | Status: DC

## 2015-06-27 NOTE — Telephone Encounter (Signed)
Pt called stating that hot flashes are getting worse. Pt is requesting an increase in Estrogen dosage, please advise

## 2015-06-27 NOTE — Progress Notes (Signed)
[name] is a 64 y.o. female.    Reason for Appointment: Diabetes follow-up   History of Present Illness   Diagnosis: Type 2 DIABETES MELITUS, date of diagnosis: 2000  Previous history: She had initially been treated with metformin and Amaryl and subsequently given Victoza which helped overall control and weight loss. In 9/13 because of significant hyperglycemia she was started on basal bolus insulin also. Had required relatively small doses of insulin. Her blood sugar control has been excellent with A1c between 5.3-6.0 although this may be relatively higher than expected. Tends to have relatively higher readings after supper In 6/15 she was told to resume her Victoza and stop her Lantus since she had been gaining weight with Lantus and NovoLog regimen.   Recent history:     She has not been seen in follow-up since 5/16 She has stopped taking her Victoza sometime ago because of lack of insurance coverage and cost Although she was told to stop metformin when her renal function was worse she is still taking 500 mg twice a day Also has not had a recent A1c Her blood sugars are significantly out of control but in the last few days has taken some Victoza from her husband and blood sugars are coming down Also previously had been given Novolog for mealtime coverage  Current blood sugar patterns and management:  Fasting blood sugars are significantly high at times and probably higher last month from getting a 6 day course of prednisone.  Blood sugars are again higher in the evenings fairly consistently but the last few days has had a couple of good readings  She is usually not checking her sugars after breakfast or lunch and these are slightly better  She is not able to do any exercise    Oral hypoglycemic drugs: Amaryl  1 mg , metformin 500 mg twice a day   Side effects from medications:  none  Monitors blood glucose: Once a day or less on average .    Glucometer:   Accu-Chek compact.          Blood Glucose readings from monitor download:  PRE-MEAL Breakfast Lunch Dinner Bedtime Overall  Glucose range:  112-328    229   139-291    Mean/median:  187     234   186    Hypoglycemia: None    Meals: 1-3 meals per day. Supper at 5-7 PM pm          Physical activity: exercise: Limited, has had fatigue, back and leg pain.           Dietician visit: Most recent: 04/2012           Complications: are: None  Wt Readings from Last 3 Encounters:  06/27/15 255 lb (115.667 kg)  05/25/15 253 lb (114.76 kg)  03/24/15 254 lb (115.214 kg)     PROBLEM 2: She is complaining about hot flashes and sweating episodes during the day and night.  This is despite taking estradiol tablets   LABS:  Lab Results  Component Value Date   HGBA1C 5.7 03/01/2015   HGBA1C 6.4 09/26/2014   HGBA1C 5.7 04/06/2014   Lab Results  Component Value Date   MICROALBUR 0.4 02/23/2014   LDLCALC 93 03/01/2015   CREATININE 1.63* 03/24/2015    No visits with results within 1 Week(s) from this visit. Latest known visit with results is:  Documentation on 04/06/2015  Component Date Value Ref Range Status  . HM Diabetic Eye  Exam 03/30/2015 Retinopathy* No Retinopathy Final   Moderate non-proliferative Dbtc Ret.w/Dbtic Macular Edema OU      Medication List       This list is accurate as of: 06/27/15  4:32 PM.  Always use your most recent med list.               AMBULATORY NON FORMULARY MEDICATION  Domperidone 10 mg    Take one tablet by mouth twice a day before meals     amLODipine-valsartan 10-320 MG per tablet  Commonly known as:  EXFORGE  Take 1 tablet by mouth daily.     amLODipine-valsartan 10-320 MG per tablet  Commonly known as:  EXFORGE  TAKE 1 TABLET BY MOUTH DAILY.     aspirin 81 MG EC tablet  Take 81 mg by mouth daily.     cloNIDine 0.1 mg/24hr patch  Commonly known as:  CATAPRES - Dosed in mg/24 hr  Place 1 patch (0.1 mg total) onto the skin once a week.       ELMIRON 100 MG capsule  Generic drug:  pentosan polysulfate  Take 100 mg by mouth 2 (two) times daily.     estradiol 1 MG tablet  Commonly known as:  ESTRACE  TAKE 1 TABLET (1 MG TOTAL) BY MOUTH DAILY.     FLUoxetine 20 MG capsule  Commonly known as:  PROZAC  TAKE 1 CAPSULE (20 MG TOTAL) BY MOUTH DAILY.     furosemide 40 MG tablet  Commonly known as:  LASIX  Take 1 tablet (40 mg total) by mouth daily.     gabapentin 600 MG tablet  Commonly known as:  NEURONTIN  Take 600 mg by mouth 2 (two) times daily.     glimepiride 1 MG tablet  Commonly known as:  AMARYL  TAKE 1 TABLET BY MOUTH WITH SUPPER ONCE A DAY PER DR. Dwyane Dee     glucose blood test strip  Commonly known as:  ACCU-CHEK COMPACT PLUS  USE AS INSTRUCTED TO CHECK BLOOD SUGARS ONCE A DAY DX CODE E11.9     HYDROcodone-acetaminophen 7.5-325 MG per tablet  Commonly known as:  NORCO  Take 1 tablet by mouth every 6 (six) hours as needed for moderate pain. (cough)     isosorbide dinitrate 10 MG tablet  Commonly known as:  ISORDIL  TAKE ONE TABLET BY MOUTH DAILY     levothyroxine 100 MCG tablet  Commonly known as:  SYNTHROID, LEVOTHROID  TAKE 1 TABLET (100 MCG TOTAL) BY MOUTH DAILY.     Linaclotide 145 MCG Caps capsule  Commonly known as:  LINZESS  Take 1 capsule (145 mcg total) by mouth every other day.     metoprolol 50 MG tablet  Commonly known as:  LOPRESSOR  Take one and one-half tablets twice a day     nitrofurantoin 100 MG capsule  Commonly known as:  MACRODANTIN     NOVOLOG FLEXPEN 100 UNIT/ML FlexPen  Generic drug:  insulin aspart  Inject 10 Units into the skin 3 (three) times daily with meals.     omeprazole 20 MG capsule  Commonly known as:  PRILOSEC  Take 1 capsule (20 mg total) by mouth daily.     ondansetron 4 MG tablet  Commonly known as:  ZOFRAN  Take 1 tab every 6 hours as needed for nausea.     simvastatin 20 MG tablet  Commonly known as:  ZOCOR  TAKE 1 TABLET BY MOUTH DAILY      sucralfate 1  GM/10ML suspension  Commonly known as:  CARAFATE  Take 2 teaspoons three times daily before meals.     trimethoprim 100 MG tablet  Commonly known as:  TRIMPEX  Take 100 mg by mouth daily.     VICTOZA 18 MG/3ML Sopn  Generic drug:  Liraglutide  Inject 1.2 mg into the skin daily. Inject once daily at the same time        Allergies:  Allergies  Allergen Reactions  . Hydrocodone Other (See Comments)    tachycardia  . Ciprofloxacin Other (See Comments)    dizzy  . Penicillins Hives    Past Medical History  Diagnosis Date  . COMMON MIGRAINE   . DIVERTICULOSIS-COLON   . CORONARY ARTERY DISEASE   . Gastroparesis   . INSOMNIA-SLEEP DISORDER-UNSPEC   . Iron deficiency anemia   . Anxiety   . DEPRESSION   . Diabetes mellitus, type II   . GERD (gastroesophageal reflux disease)   . Hyperlipidemia   . Hypertension   . Gastric ulcer 04/2008  . CERVICAL RADICULOPATHY, LEFT   . Hiatal hernia   . Hypothyroidism   . Arthritis     Past Surgical History  Procedure Laterality Date  . Left knee replacement    . Rigth knee replacement with revision  04/2008    Dr. Luiz Blare  . Gastric wedge resection lipoma  11/2007    x2 with laparotomy and gastrostomy  . Rotator cuff repair  01/2009  . Cholecystectomy  06/2009  . S/p bladder surgury  09/2009    Dr. Vonita Moss  . Abdominal hysterectomy      Family History  Problem Relation Age of Onset  . Diabetes Sister     x 3  . Heart disease Sister     x2  . Diabetes Brother     x3  . Heart disease Brother     x2  . Coronary artery disease Other     female 1st degree  . Hypertension Other   . Colon cancer Neg Hx     Social History:  reports that she has quit smoking. She has never used smokeless tobacco. She reports that she does not drink alcohol or use illicit drugs.  Review of Systems:  Hypertension: Her blood pressure is treated with metoprolol and Exforge Not monitoring blood pressure at home Blood pressure is being  controlled recently  She tends to have relatively fast heart rate of unclear etiology, possibly autonomic neuropathy  Mild CKD: Her creatinine is  persistently higher and she apparently has had no specific diagnosis made by the nephrologist No recent labs available   Lab Results  Component Value Date   CREATININE 1.63* 03/24/2015   BUN 27* 03/24/2015   NA 138 03/24/2015   K 4.0 03/24/2015   CL 109 03/24/2015   CO2 22 03/24/2015    Lipids: Usually well controlled with simvastatin but needs follow-up levels  Lab Results  Component Value Date   CHOL 171 03/01/2015   HDL 61.10 03/01/2015   LDLCALC 93 03/01/2015   LDLDIRECT 156.6 01/07/2011   TRIG 86.0 03/01/2015   CHOLHDL 3 03/01/2015     Post ablative hypothyroidism: She has been on 100 ug  thyroid supplement since she had I-131 treatment on 08/20/11 for Graves' disease  She does feel tired and this appears to be chronic also, at times has difficulty sleeping Her TSH was high in 2/16 when the dose was increased Repeat level pending  Lab Results  Component Value Date  TSH 0.19* 03/01/2015   TSH 9.13* 11/07/2014   TSH 6.94* 09/26/2014   FREET4 1.06 03/01/2015   FREET4 0.64 04/06/2014   FREET4 0.96 02/24/2014    She has had periodic epidural injections for her back but none since about June    Examination:   BP 132/74 mmHg  Pulse 75  Temp(Src) 98.7 F (37.1 C) (Oral)  Ht $R'5\' 5"'Uo$  (1.651 m)  Wt 255 lb (115.667 kg)  BMI 42.43 kg/m2  SpO2 97%  Body mass index is 42.43 kg/(m^2).   No leg edema present  Biceps reflexes difficult to elicit  ASSESSMENT/ PLAN: 9/16  Diabetes type 2, uncontrolled :   Blood glucose control is appearing to be worse with her blood sugars going up with stopping Victoza Also not clear if she can take metformin currently with her history of renal dysfunction She is not able to do much exercise or lose weight Blood sugar control is usually worse when she is getting some form of steroid  also Most recently her sugars appear to be improving with going back on Victoza  She does need to get back on her Victoza and she will need to call the company to Get assistance for this  Recommendations made:  Monitor blood sugars more consistently including 2 hours after meals  Temporarily increase her Amaryl to at least twice a day if she gets steroids  Consider using insulin if her blood sugars are consistently high with steroids  Go back to 1.2 mg Victoza as above  Consistent diet  Hypothyroidism: TSH needs to be rechecked  Difficult to assess subjectively as she has fatigue from other causes  HOT flashes: These are likely to be menopausal and she will be empirically tried on clonidine patch, meanwhile will reduce her Exforge in half.  Discussed how this works and benefits and possible side effects May need reduced dose of valsartan anyway because of her tendency to higher creatinine level  HYPERTENSION: Blood pressure is appearing controlled with current regimen and she has no recent edema  Renal insufficiency: Creatinine to be rechecked today.  Etiology unclear   Counseling time on subjects discussed above is over 50% of today's 25 minute visit  Patient Instructions  Use 1/2 exforge with clonidine patch      [name] 06/27/2015, 4:32 PM

## 2015-06-27 NOTE — Patient Instructions (Addendum)
Use 1/2 exforge with clonidine patch

## 2015-06-28 NOTE — Telephone Encounter (Signed)
Very sorry, but she is already on the highest dose estrogen I would normally prescribe at 1 mg per day; please cont same dose

## 2015-06-29 ENCOUNTER — Telehealth: Payer: Self-pay | Admitting: Endocrinology

## 2015-06-29 ENCOUNTER — Other Ambulatory Visit: Payer: Self-pay | Admitting: *Deleted

## 2015-06-29 MED ORDER — LEVOTHYROXINE SODIUM 88 MCG PO TABS: 88.0000 ug | ORAL_TABLET | Freq: Every day | ORAL | Status: DC

## 2015-06-29 NOTE — Telephone Encounter (Signed)
Done hardcopy to Dahlia  

## 2015-06-29 NOTE — Progress Notes (Signed)
Quick Note:  Please let patient know that the kidney test is worse, needs to stop metformin and follow-up with kidney Dr. Burna Forts Thyroid level is high, change Synthroid to 88 g instead of 100  ______

## 2015-06-29 NOTE — Telephone Encounter (Signed)
Pt inform of MD message below.   Pt is requesting a referral for gynecology.

## 2015-06-29 NOTE — Telephone Encounter (Signed)
Patient is returning your call.  

## 2015-06-30 ENCOUNTER — Telehealth: Payer: Self-pay | Admitting: Internal Medicine

## 2015-07-03 ENCOUNTER — Telehealth: Payer: Self-pay | Admitting: Internal Medicine

## 2015-07-03 NOTE — Telephone Encounter (Signed)
Rec'd from [place] forward 3 pages to Dr. Jenny Reichmann

## 2015-07-04 ENCOUNTER — Other Ambulatory Visit: Payer: Self-pay | Admitting: Internal Medicine

## 2015-07-07 DIAGNOSIS — N3010 Interstitial cystitis (chronic) without hematuria: Secondary | ICD-10-CM | POA: Diagnosis not present

## 2015-07-10 ENCOUNTER — Other Ambulatory Visit: Payer: Self-pay | Admitting: *Deleted

## 2015-07-10 MED ORDER — VICTOZA 18 MG/3ML ~~LOC~~ SOPN: 1.2000 mg | PEN_INJECTOR | Freq: Every day | SUBCUTANEOUS | Status: DC

## 2015-07-17 ENCOUNTER — Ambulatory Visit (INDEPENDENT_AMBULATORY_CARE_PROVIDER_SITE_OTHER): Payer: Medicare Other | Admitting: Gynecology

## 2015-07-17 ENCOUNTER — Encounter: Payer: Self-pay | Admitting: Gynecology

## 2015-07-17 VITALS — BP 132/88 | Ht 65.0 in | Wt 251.0 lb

## 2015-07-17 DIAGNOSIS — M6289 Other specified disorders of muscle: Secondary | ICD-10-CM

## 2015-07-17 DIAGNOSIS — Z23 Encounter for immunization: Secondary | ICD-10-CM

## 2015-07-17 DIAGNOSIS — N951 Menopausal and female climacteric states: Secondary | ICD-10-CM | POA: Diagnosis not present

## 2015-07-17 MED ORDER — ESTRADIOL 2 MG PO TABS: 2.0000 mg | ORAL_TABLET | Freq: Every day | ORAL | Status: DC

## 2015-07-17 NOTE — Progress Notes (Signed)
   Patient is a 64 year old who was referred to our practice by her PCP Dr. Cathlean Cower is a result of patient's vasomotor symptoms consisting of severe hot flashes and night sweats and insomnia. Patient also complaining of tiredness and muscle fatigue. Patient with past history of total abdominal hysterectomy for benign diagnosis. Patient is currently on Estrace 1 mg daily. Patient reports last colonoscopy less than 10 years ago. Her last mammogram was in 2011. His been greater than 5 years since her last bone density study.  Her medical records and past medical history and current medication were reviewed. We had a lengthy discussion on the women's health initiative study. We discussed the risks benefits and pros and cons of hormone replacement therapy to include deep venous thrombosis, pulmonary embolism and breast cancer. Patient currently on Estrace 1 mg which has not worked well since her symptoms are the same or worse we are going to increase it to 2 mg daily. She can also use peppermint oil to apply behind her ear when necessary for breakthrough vasomotor symptoms. Because of a muscle tiredness and fatigue were going to check a vitamin D level as well. We discussed importance of calcium vitamin D and weightbearing exercises for osteoporosis prevention. She was provided with a requisition to schedule her overdue mammogram as well as her bone density study.  I have recommended she stay on hormone replacement therapy not to exceed 5 more years as per recommendation.  Greater than 50% of the time was spent counseling correlating care for this patient with menopausal symptoms affecting her quality of life.

## 2015-07-17 NOTE — Patient Instructions (Signed)
Bone Densitometry Bone densitometry is an imaging test that uses a special X-ray to measure the amount of calcium and other minerals in your bones (bone density). This test is also known as a bone mineral density test or dual-energy X-ray absorptiometry (DXA). The test can measure bone density at your hip and your spine. It is similar to having a regular X-ray. You may have this test to:  Diagnose a condition that causes weak or thin bones (osteoporosis).  Predict your risk of a broken bone (fracture).  Determine how well osteoporosis treatment is working. LET [facility] CARE PROVIDER KNOW ABOUT:  Any allergies you have.  All medicines you are taking, including vitamins, herbs, eye drops, creams, and over-the-counter medicines.  Previous problems you or members of your family have had with the use of anesthetics.  Any blood disorders you have.  Previous surgeries you have had.  Medical conditions you have.  Possibility of pregnancy.  Any other medical test you had within the previous 14 days that used contrast material. RISKS AND COMPLICATIONS Generally, this is a safe procedure. However, problems can occur and may include the following:  This test exposes you to a very small amount of radiation.  The risks of radiation exposure may be greater to unborn children. BEFORE THE PROCEDURE  Do not take any calcium supplements for 24 hours before having the test. You can otherwise eat and drink what you usually do.  Take off all metal jewelry, eyeglasses, dental appliances, and any other metal objects. PROCEDURE  You may lie on an exam table. There will be an X-ray generator below you and an imaging device above you.  Other devices, such as boxes or braces, may be used to position your body properly for the scan.  You will need to lie still while the machine slowly scans your body.  The images will show up on a computer monitor. AFTER THE PROCEDURE You may need more testing  at a later time.   This information is not intended to replace advice given to you by your health care provider. Make sure you discuss any questions you have with your health care provider.   Document Released: 10/15/2004 Document Revised: 10/14/2014 Document Reviewed: 03/03/2014 Elsevier Interactive Patient Education 2016 [employer]. Estradiol tablets What is this medicine? ESTRADIOL (es tra DYE ole) is an estrogen. It is mostly used as hormone replacement in menopausal women. It helps to treat hot flashes and prevent osteoporosis. It is also used to treat women with low estrogen levels or those who have had their ovaries removed. This medicine may be used for other purposes; ask your health care provider or pharmacist if you have questions. What should I tell my health care provider before I take this medicine? They need to know if you have or ever had any of these conditions: -abnormal vaginal bleeding -blood vessel disease or blood clots -breast, cervical, endometrial, ovarian, liver, or uterine cancer -dementia -diabetes -gallbladder disease -heart disease or recent heart attack -high blood pressure -high cholesterol -high level of calcium in the blood -hysterectomy -kidney disease -liver disease -migraine headaches -protein C deficiency -protein S deficiency -stroke -systemic lupus erythematosus (SLE) -tobacco smoker -an unusual or allergic reaction to estrogens, other hormones, medicines, foods, dyes, or preservatives -pregnant or trying to get pregnant -breast-feeding How should I use this medicine? Take this medicine by mouth. To reduce nausea, this medicine may be taken with food. Follow the directions on the prescription label. Take this medicine at the same  time each day and in the order directed on the package. Do not take your medicine more often than directed. Contact your pediatrician regarding the use of this medicine in children. Special care may be needed. A  patient package insert for the product will be given with each prescription and refill. Read this sheet carefully each time. The sheet may change frequently. Overdosage: If you think you have taken too much of this medicine contact a poison control center or emergency room at once. NOTE: This medicine is only for you. Do not share this medicine with others. What if I miss a dose? If you miss a dose, take it as soon as you can. If it is almost time for your next dose, take only that dose. Do not take double or extra doses. What may interact with this medicine? Do not take this medicine with any of the following medications: -aromatase inhibitors like aminoglutethimide, anastrozole, exemestane, letrozole, testolactone This medicine may also interact with the following medications: -carbamazepine -certain antibiotics used to treat infections -certain barbiturates or benzodiazepines used for inducing sleep or treating seizures -grapefruit juice -medicines for fungus infections like itraconazole and ketoconazole -raloxifene or tamoxifen -rifabutin, rifampin, or rifapentine -ritonavir -St. John's Wort -warfarin This list may not describe all possible interactions. Give your health care provider a list of all the medicines, herbs, non-prescription drugs, or dietary supplements you use. Also tell them if you smoke, drink alcohol, or use illegal drugs. Some items may interact with your medicine. What should I watch for while using this medicine? Visit your doctor or health care professional for regular checks on your progress. You will need a regular breast and pelvic exam and Pap smear while on this medicine. You should also discuss the need for regular mammograms with your health care professional, and follow his or her guidelines for these tests. This medicine can make your body retain fluid, making your fingers, hands, or ankles swell. Your blood pressure can go up. Contact your doctor or health  care professional if you feel you are retaining fluid. If you have any reason to think you are pregnant, stop taking this medicine right away and contact your doctor or health care professional. Smoking increases the risk of getting a blood clot or having a stroke while you are taking this medicine, especially if you are more than 65 years old. You are strongly advised not to smoke. If you wear contact lenses and notice visual changes, or if the lenses begin to feel uncomfortable, consult your eye doctor or health care professional. This medicine can increase the risk of developing a condition (endometrial hyperplasia) that may lead to cancer of the lining of the uterus. Taking progestins, another hormone drug, with this medicine lowers the risk of developing this condition. Therefore, if your uterus has not been removed (by a hysterectomy), your doctor may prescribe a progestin for you to take together with your estrogen. You should know, however, that taking estrogens with progestins may have additional health risks. You should discuss the use of estrogens and progestins with your health care professional to determine the benefits and risks for you. If you are going to have surgery, you may need to stop taking this medicine. Consult your health care professional for advice before you schedule the surgery. What side effects may I notice from receiving this medicine? Side effects that you should report to your doctor or health care professional as soon as possible: -allergic reactions like skin rash, itching or hives, swelling  of the face, lips, or tongue -breast tissue changes or discharge -changes in vision -chest pain -confusion, trouble speaking or understanding -dark urine -general ill feeling or flu-like symptoms -light-colored stools -nausea, vomiting -pain, swelling, warmth in the leg -right upper belly pain -severe headaches -shortness of breath -sudden numbness or weakness of the face,  arm or leg -trouble walking, dizziness, loss of balance or coordination -unusual vaginal bleeding -yellowing of the eyes or skin Side effects that usually do not require medical attention (report to your doctor or health care professional if they continue or are bothersome): -hair loss -increased hunger or thirst -increased urination -symptoms of vaginal infection like itching, irritation or unusual discharge -unusually weak or tired This list may not describe all possible side effects. Call your doctor for medical advice about side effects. You may report side effects to FDA at 1-800-FDA-1088. Where should I keep my medicine? Keep out of the reach of children. Store at room temperature between 20 and 25 degrees C (68 and 77 degrees F). Keep container tightly closed. Protect from light. Throw away any unused medicine after the expiration date. NOTE: This sheet is a summary. It may not cover all possible information. If you have questions about this medicine, talk to your doctor, pharmacist, or health care provider.    2016, Elsevier/Gold Standard. (2010-12-26 [id number]) Hormone Therapy At menopause, your body begins making less estrogen and progesterone hormones. This causes the body to stop having menstrual periods. This is because estrogen and progesterone hormones control your periods and menstrual cycle. A lack of estrogen may cause symptoms such as:  Hot flushes (or hot flashes).  Vaginal dryness.  Dry skin.  Loss of sex drive.  Risk of bone loss (osteoporosis). When this happens, you may choose to take hormone therapy to get back the estrogen lost during menopause. When the hormone estrogen is given alone, it is usually referred to as ET (Estrogen Therapy). When the hormone progestin is combined with estrogen, it is generally called HT (Hormone Therapy). This was formerly known as hormone replacement therapy (HRT). Your caregiver can help you make a decision on what will be best  for you. The decision to use HT seems to change often as new studies are done. Many studies do not agree on the benefits of hormone replacement therapy. LIKELY BENEFITS OF HT INCLUDE PROTECTION FROM:  Hot Flushes (also called hot flashes) - A hot flush is a sudden feeling of heat that spreads over the face and body. The skin may redden like a blush. It is connected with sweats and sleep disturbance. Women going through menopause may have hot flushes a few times a month or several times per day depending on the woman.  Osteoporosis (bone loss) - Estrogen helps guard against bone loss. After menopause, a woman's bones slowly lose calcium and become weak and brittle. As a result, bones are more likely to break. The hip, wrist, and spine are affected most often. Hormone therapy can help slow bone loss after menopause. Weight bearing exercise and taking calcium with vitamin D also can help prevent bone loss. There are also medications that your caregiver can prescribe that can help prevent osteoporosis.  Vaginal dryness - Loss of estrogen causes changes in the vagina. Its lining may become thin and dry. These changes can cause pain and bleeding during sexual intercourse. Dryness can also lead to infections. This can cause burning and itching. (Vaginal estrogen treatment can help relieve pain, itching, and dryness.)  Urinary tract infections are  more common after menopause because of lack of estrogen. Some women also develop urinary incontinence because of low estrogen levels in the vagina and bladder.  Possible other benefits of estrogen include a positive effect on mood and short-term memory in women. RISKS AND COMPLICATIONS  Using estrogen alone without progesterone causes the lining of the uterus to grow. This increases the risk of lining of the uterus (endometrial) cancer. Your caregiver should give another hormone called progestin if you have a uterus.  Women who take combined (estrogen and  progestin) HT appear to have an increased risk of breast cancer. The risk appears to be small, but increases throughout the time that HT is taken.  Combined therapy also makes the breast tissue slightly denser which makes it harder to read mammograms (breast X-rays).  Combined, estrogen and progesterone therapy can be taken together every day, in which case there may be spotting of blood. HT therapy can be taken cyclically in which case you will have menstrual periods. Cyclically means HT is taken for a set amount of days, then not taken, then this process is repeated.  HT may increase the risk of stroke, heart attack, breast cancer and forming blood clots in your leg.  Transdermal estrogen (estrogen that is absorbed through the skin with a patch or a cream) may have better results with:  Cholesterol.  Blood pressure.  Blood clots. Having the following conditions may indicate you should not have HT:  Endometrial cancer.  Liver disease.  Breast cancer.  Heart disease.  History of blood clots.  Stroke. TREATMENT   If you choose to take HT and have a uterus, usually estrogen and progestin are prescribed.  Your caregiver will help you decide the best way to take the medications.  Possible ways to take estrogen include:  Pills.  Patches.  Gels.  Sprays.  Vaginal estrogen cream, rings and tablets.  It is best to take the lowest dose possible that will help your symptoms and take them for the shortest period of time that you can.  Hormone therapy can help relieve some of the problems (symptoms) that affect women at menopause. Before making a decision about HT, talk to your caregiver about what is best for you. Be well informed and comfortable with your decisions. HOME CARE INSTRUCTIONS   Follow your caregivers advice when taking the medications.  A Pap test is done to screen for cervical cancer.  The first Pap test should be done at age 52.  Between ages 28 and 30,  Pap tests are repeated every 2 years.  Beginning at age 74, you are advised to have a Pap test every 3 years as long as the past 3 Pap tests have been normal.  Some women have medical problems that increase the chance of getting cervical cancer. Talk to your caregiver about these problems. It is especially important to talk to your caregiver if a new problem develops soon after your last Pap test. In these cases, your caregiver may recommend more frequent screening and Pap tests.  The above recommendations are the same for women who have or have not gotten the vaccine for HPV (human papillomavirus).  If you had a hysterectomy for a problem that was not a cancer or a condition that could lead to cancer, then you no longer need Pap tests. However, even if you no longer need a Pap test, a regular exam is a good idea to make sure no other problems are starting.  If you are between  ages 53 and 70, and you have had normal Pap tests going back 10 years, you no longer need Pap tests. However, even if you no longer need a Pap test, a regular exam is a good idea to make sure no other problems are starting.  If you have had past treatment for cervical cancer or a condition that could lead to cancer, you need Pap tests and screening for cancer for at least 20 years after your treatment.  If Pap tests have been discontinued, risk factors (such as a new sexual partner)need to be re-assessed to determine if screening should be resumed.  Some women may need screenings more often if they are at high risk for cervical cancer.  Get mammograms done as per the advice of your caregiver. SEEK IMMEDIATE MEDICAL CARE IF:  You develop abnormal vaginal bleeding.  You have pain or swelling in your legs, shortness of breath, or chest pain.  You develop dizziness or headaches.  You have lumps or changes in your breasts or armpits.  You have slurred speech.  You develop weakness or numbness of your arms or  legs.  You have pain, burning, or bleeding when urinating.  You develop abdominal pain.   This information is not intended to replace advice given to you by your health care provider. Make sure you discuss any questions you have with your health care provider.   Document Released: 06/22/2003 Document Revised: 02/07/2015 Document Reviewed: 03/27/2015 Elsevier Interactive Patient Education [employer].

## 2015-07-18 NOTE — Addendum Note (Signed)
Addended by: Joaquin Music on: 07/18/2015 04:09 PM   Modules accepted: Orders

## 2015-07-25 ENCOUNTER — Telehealth: Payer: Self-pay | Admitting: *Deleted

## 2015-07-25 ENCOUNTER — Encounter: Payer: Self-pay | Admitting: Internal Medicine

## 2015-07-25 ENCOUNTER — Other Ambulatory Visit (INDEPENDENT_AMBULATORY_CARE_PROVIDER_SITE_OTHER): Payer: Medicare Other

## 2015-07-25 ENCOUNTER — Ambulatory Visit (INDEPENDENT_AMBULATORY_CARE_PROVIDER_SITE_OTHER): Payer: Medicare Other | Admitting: Internal Medicine

## 2015-07-25 ENCOUNTER — Other Ambulatory Visit: Payer: Self-pay | Admitting: Internal Medicine

## 2015-07-25 ENCOUNTER — Ambulatory Visit (INDEPENDENT_AMBULATORY_CARE_PROVIDER_SITE_OTHER)
Admission: RE | Admit: 2015-07-25 | Discharge: 2015-07-25 | Disposition: A | Discharge status: Home / Self Care | Payer: Medicare Other | Source: Ambulatory Visit | Attending: Internal Medicine | Admitting: Internal Medicine

## 2015-07-25 VITALS — BP 136/84 | HR 81 | Temp 98.6°F | Ht 65.0 in | Wt 248.0 lb

## 2015-07-25 DIAGNOSIS — R109 Unspecified abdominal pain: Secondary | ICD-10-CM | POA: Diagnosis not present

## 2015-07-25 DIAGNOSIS — I10 Essential (primary) hypertension: Secondary | ICD-10-CM

## 2015-07-25 DIAGNOSIS — R1031 Right lower quadrant pain: Secondary | ICD-10-CM | POA: Diagnosis not present

## 2015-07-25 DIAGNOSIS — E119 Type 2 diabetes mellitus without complications: Secondary | ICD-10-CM | POA: Diagnosis not present

## 2015-07-25 DIAGNOSIS — R1084 Generalized abdominal pain: Secondary | ICD-10-CM

## 2015-07-25 LAB — BASIC METABOLIC PANEL
BUN: 13 mg/dL (ref 6–23)
CO2: 25 mEq/L (ref 19–32)
Calcium: 9.7 mg/dL (ref 8.4–10.5)
Chloride: 109 mEq/L (ref 96–112)
Creatinine, Ser: 1.94 mg/dL — ABNORMAL HIGH (ref 0.40–1.20)
GFR: 33.27 mL/min — ABNORMAL LOW (ref >60.00)
Glucose, Bld: 149 mg/dL — ABNORMAL HIGH (ref 70–99)
Potassium: 4.6 mEq/L (ref 3.5–5.1)
Sodium: 137 mEq/L (ref 135–145)

## 2015-07-25 LAB — URINALYSIS, ROUTINE W REFLEX MICROSCOPIC
Bilirubin Urine: NEGATIVE
Hgb urine dipstick: NEGATIVE
Ketones, ur: NEGATIVE
Leukocytes, UA: NEGATIVE
Nitrite: NEGATIVE
RBC / HPF: NONE SEEN (ref 0–?)
Specific Gravity, Urine: 1.025 (ref 1.000–1.030)
Total Protein, Urine: NEGATIVE
Urine Glucose: NEGATIVE
Urobilinogen, UA: 0.2 (ref 0.0–1.0)
pH: 5.5 (ref 5.0–8.0)

## 2015-07-25 LAB — CBC WITH DIFFERENTIAL/PLATELET
Basophils Absolute: 0 10*3/uL (ref 0.0–0.1)
Basophils Relative: 0.5 % (ref 0.0–3.0)
Eosinophils Absolute: 0.2 10*3/uL (ref 0.0–0.7)
Eosinophils Relative: 2.8 % (ref 0.0–5.0)
HCT: 35 % — ABNORMAL LOW (ref 36.0–46.0)
Hemoglobin: 11.5 g/dL — ABNORMAL LOW (ref 12.0–15.0)
Lymphocytes Relative: 27.7 % (ref 12.0–46.0)
Lymphs Abs: 1.9 10*3/uL (ref 0.7–4.0)
MCHC: 33 g/dL (ref 30.0–36.0)
MCV: 93.8 fl (ref 78.0–100.0)
Monocytes Absolute: 0.4 10*3/uL (ref 0.1–1.0)
Monocytes Relative: 5.5 % (ref 3.0–12.0)
Neutro Abs: 4.3 10*3/uL (ref 1.4–7.7)
Neutrophils Relative %: 63.5 % (ref 43.0–77.0)
Platelets: 276 10*3/uL (ref 150.0–400.0)
RBC: 3.73 Mil/uL — ABNORMAL LOW (ref 3.87–5.11)
RDW: 13.6 % (ref 11.5–15.5)
WBC: 6.7 10*3/uL (ref 4.0–10.5)

## 2015-07-25 LAB — HEPATIC FUNCTION PANEL
ALT: 10 U/L (ref 0–35)
AST: 11 U/L (ref 0–37)
Albumin: 3.9 g/dL (ref 3.5–5.2)
Alkaline Phosphatase: 64 U/L (ref 39–117)
Bilirubin, Direct: 0.1 mg/dL (ref 0.0–0.3)
Total Bilirubin: 0.7 mg/dL (ref 0.2–1.2)
Total Protein: 7.6 g/dL (ref 6.0–8.3)

## 2015-07-25 LAB — LIPASE: Lipase: 46 U/L (ref 11.0–59.0)

## 2015-07-25 MED ORDER — METRONIDAZOLE 500 MG PO TABS
500.0000 mg | ORAL_TABLET | Freq: Three times a day (TID) | ORAL | Status: DC
Start: 2015-07-25 — End: 2016-03-27

## 2015-07-25 MED ORDER — LEVOFLOXACIN 250 MG PO TABS: 250.0000 mg | ORAL_TABLET | Freq: Every day | ORAL | Status: DC

## 2015-07-25 MED ORDER — ONDANSETRON HCL 4 MG PO TABS: ORAL_TABLET | ORAL | Status: DC

## 2015-07-25 MED ORDER — OXYCODONE HCL 5 MG PO TABS
5.0000 mg | ORAL_TABLET | ORAL | Status: DC | PRN
Start: 2015-07-25 — End: 2015-11-23

## 2015-07-25 NOTE — Progress Notes (Signed)
Pre visit review using our clinic review tool, if applicable. No additional management support is needed unless otherwise documented below in the visit note. 

## 2015-07-25 NOTE — Assessment & Plan Note (Signed)
Ok to hold glimeparide until acute illness resolved and taking betterpo,  to f/u any worsening symptoms or concerns

## 2015-07-25 NOTE — Assessment & Plan Note (Addendum)
Acute onset, mod to severe, afeb but marked tender on exam, assoc with nausa and reduced po intake with some tendency possibly to volume depletion on top to CKD; s/p ccx , diff includes UTI, diverticultiis, panreatitis vs other,doubt obstruction however.  Today for stat labs and CT for further evaluation, pain control, antiemetic, and empiric flagyl/levaquin, to ER for any worsening pain, n/v , fever or blood.  Note:  Total time for pt hx, exam, review of record with pt in the room, determination of diagnoses and plan for further eval and tx is > 40 min, with over 50% spent in coordination and counseling of patient

## 2015-07-25 NOTE — Progress Notes (Signed)
Subjective:    Patient ID: [name], female    DOB: 1950/12/08, 64 y.o.   MRN: [id number]  HPI  Here with acute c/o abd pain x 3 days to right lower abd, constant, sometimes mild, more often mod to severe, wax and wanes, sharp with more severe, some nauseaa, no vomiting, raidaties somewhat to right lateral lower side, no fever but had some chills last night; nothing seems to make better or worse with anything; no dysuria but has nocuturia several times per night, no change to her; has had reduced po intake last few days, last BM at start of pain - no difference after BM in terms of pain, no other stool change or blood. Denies dysphagia. Sees urology with IC, sees Dr Vikki Ports, usually has ,more left lower abd side pain with that. S/p ccx, also s/p partial gastrectomy per Dr Martin/gen surg. Also sees nephroogy with CKD  Husband helps with hx today as pt in much pain, holding her abd, bent over at waist somewhat sitting in chair and exam table. Pain worse to lie down and sit up today on exam. Last colonscopy with diverticulosis, Note allergy to PCN, cipro, hydrocodone Past Medical History  Diagnosis Date  . COMMON MIGRAINE   . DIVERTICULOSIS-COLON   . CORONARY ARTERY DISEASE   . Gastroparesis   . INSOMNIA-SLEEP DISORDER-UNSPEC   . Iron deficiency anemia   . Anxiety   . DEPRESSION   . Diabetes mellitus, type II ([place])   . GERD (gastroesophageal reflux disease)   . Hyperlipidemia   . Hypertension   . Gastric ulcer 04/2008  . CERVICAL RADICULOPATHY, LEFT   . Hiatal hernia   . Hypothyroidism   . Arthritis    Past Surgical History  Procedure Laterality Date  . Left knee replacement    . Rigth knee replacement with revision  04/2008    Dr. Berenice Primas  . Gastric wedge resection lipoma  11/2007    x2 with laparotomy and gastrostomy  . Rotator cuff repair  01/2009  . Cholecystectomy  06/2009  . S/p bladder surgury  09/2009    Dr. Terance Hart  . Abdominal hysterectomy      reports that she has  quit smoking. She has never used smokeless tobacco. She reports that she does not drink alcohol or use illicit drugs. family history includes Coronary artery disease in her other; Diabetes in her brother and sister; Heart disease in her brother and sister; Hypertension in her other. There is no history of Colon cancer. Allergies  Allergen Reactions  . Hydrocodone Other (See Comments)    tachycardia  . Ciprofloxacin Other (See Comments)    dizzy  . Penicillins Hives   Current Outpatient Prescriptions on File Prior to Visit  Medication Sig Dispense Refill  . AMBULATORY NON FORMULARY MEDICATION Domperidone 10 mg    Take one tablet by mouth twice a day before meals 120 tablet 3  . amLODipine-valsartan (EXFORGE) 10-320 MG per tablet Take 1 tablet by mouth daily. 30 tablet 5  . aspirin 81 MG EC tablet Take 81 mg by mouth daily.      . cloNIDine (CATAPRES - DOSED IN MG/24 HR) 0.1 mg/24hr patch Place 1 patch (0.1 mg total) onto the skin once a week. 4 patch 1  . ELMIRON 100 MG capsule Take 100 mg by mouth 2 (two) times daily.     Marland Kitchen estradiol (ESTRACE) 2 MG tablet Take 1 tablet (2 mg total) by mouth daily. 30 tablet 11  . FLUoxetine (  PROZAC) 20 MG capsule TAKE 1 CAPSULE (20 MG TOTAL) BY MOUTH DAILY. 90 capsule 1  . furosemide (LASIX) 40 MG tablet Take 1 tablet (40 mg total) by mouth daily.    Marland Kitchen gabapentin (NEURONTIN) 600 MG tablet Take 600 mg by mouth 2 (two) times daily.   1  . glimepiride (AMARYL) 1 MG tablet TAKE 1 TABLET BY MOUTH WITH SUPPER ONCE A DAY PER DR. Dwyane Dee 30 tablet 3  . glucose blood (ACCU-CHEK COMPACT PLUS) test strip USE AS INSTRUCTED TO CHECK BLOOD SUGARS ONCE A DAY DX CODE E11.9 100 each 1  . HYDROcodone-acetaminophen (NORCO) 7.5-325 MG per tablet Take 1 tablet by mouth every 6 (six) hours as needed for moderate pain. (cough) 60 tablet 0  . insulin aspart (NOVOLOG FLEXPEN) 100 UNIT/ML FlexPen Inject 10 Units into the skin 3 (three) times daily with meals.    . isosorbide dinitrate  (ISORDIL) 10 MG tablet TAKE ONE TABLET BY MOUTH DAILY 90 tablet 1  . levothyroxine (SYNTHROID, LEVOTHROID) 88 MCG tablet Take 1 tablet (88 mcg total) by mouth daily. 30 tablet 3  . Linaclotide (LINZESS) 145 MCG CAPS capsule Take 1 capsule (145 mcg total) by mouth every other day. 15 capsule 5  . metoprolol (LOPRESSOR) 50 MG tablet Take one and one-half tablets twice a day 90 tablet 11  . nitrofurantoin (MACRODANTIN) 100 MG capsule     . omeprazole (PRILOSEC) 20 MG capsule Take 1 capsule (20 mg total) by mouth daily. 30 capsule 11  . ondansetron (ZOFRAN) 4 MG tablet Take 1 tab every 6 hours as needed for nausea. 30 tablet 2  . simvastatin (ZOCOR) 20 MG tablet TAKE 1 TABLET BY MOUTH DAILY 90 tablet 1  . sucralfate (CARAFATE) 1 GM/10ML suspension Take 2 teaspoons three times daily before meals. 5400 mL 5  . trimethoprim (TRIMPEX) 100 MG tablet Take 100 mg by mouth daily.   1  . VICTOZA 18 MG/3ML SOPN Inject 0.2 mLs (1.2 mg total) into the skin daily. Inject once daily at the same time 2 pen 3   No current facility-administered medications on file prior to visit.   Review of Systems  Constitutional: Negative for unusual diaphoresis or night sweats HENT: Negative for ringing in ear or discharge Eyes: Negative for double vision or worsening visual disturbance.  Respiratory: Negative for choking and stridor.   Gastrointestinal: Negative for vomiting or other signifcant bowel change Genitourinary: Negative for hematuria or change in urine volume.  Musculoskeletal: Negative for other MSK pain or swelling Skin: Negative for color change and worsening wound.  Neurological: Negative for tremors and numbness other than noted  Psychiatric/Behavioral: Negative for decreased concentration or agitation other than above  '    Objective:   Physical Exam BP 136/84 mmHg  Pulse 81  Temp(Src) 98.6 F (37 C) (Oral)  Ht 5\' 5"  (1.651 m)  Wt 248 lb (112.492 kg)  BMI 41.27 kg/m2  SpO2 98% VS noted, mod ill  appearing Constitutional: Pt appears in no significant distress HENT: Head: NCAT.  Right Ear: External ear normal.  Left Ear: External ear normal.  Eyes: . Pupils are equal, round, and reactive to light. Conjunctivae and EOM are normal Neck: Normal range of motion. Neck supple.  Cardiovascular: Normal rate and regular rhythm.   Pulmonary/Chest: Effort normal and breath sounds without rales or wheezing.  Abd:  Soft,  ND, + BS, marked tender to right upper and lower abd and side, without distension, no guarding or rebound Neurological: Pt is alert.  Not confused , motor grossly intact Skin: Skin is warm. No rash, no LE edema Psychiatric: Pt behavior is normal. No agitation.      Assessment & Plan:

## 2015-07-25 NOTE — Patient Instructions (Signed)
Please take all new medication as prescribed - the antibiotic x 2, nausea medicaiton, and pain medication  OK to HOLD on taking the Lasix AND the glimeparide until you are able to eat and drink more normally  Please continue all other medications as before, and refills have been done if requested.  Please have the pharmacy call with any other refills you may need.  Please keep your appointments with your specialists as you may have planned  You will be contacted regarding the referral for: CT scan - to see the PCC's now  Please go to the LAB in the Basement (turn left off the elevator) for the tests to be done today  You will be contacted by phone if any changes need to be made immediately.  Otherwise, you will receive a letter about your results with an explanation, but please check with MyChart first.

## 2015-07-25 NOTE — Telephone Encounter (Signed)
Left msg on triage stating pharmacy didn't get script for zofran. Resent to [facility]...[name]

## 2015-07-25 NOTE — Assessment & Plan Note (Signed)
stable overall by history and exam, recent data reviewed with pt, and pt to continue medical treatment as before,  to f/u any worsening symptoms or concerns BP Readings from Last 3 Encounters:  07/25/15 136/84  07/17/15 132/88  06/27/15 132/74   For hold lasix while not taking po well

## 2015-07-26 DIAGNOSIS — R300 Dysuria: Secondary | ICD-10-CM | POA: Diagnosis not present

## 2015-07-26 DIAGNOSIS — R3989 Other symptoms and signs involving the genitourinary system: Secondary | ICD-10-CM | POA: Diagnosis not present

## 2015-07-27 DIAGNOSIS — M5416 Radiculopathy, lumbar region: Secondary | ICD-10-CM | POA: Diagnosis not present

## 2015-07-28 DIAGNOSIS — R3989 Other symptoms and signs involving the genitourinary system: Secondary | ICD-10-CM | POA: Diagnosis not present

## 2015-08-03 ENCOUNTER — Other Ambulatory Visit (INDEPENDENT_AMBULATORY_CARE_PROVIDER_SITE_OTHER): Payer: Medicare Other

## 2015-08-03 DIAGNOSIS — E1165 Type 2 diabetes mellitus with hyperglycemia: Secondary | ICD-10-CM

## 2015-08-03 DIAGNOSIS — E890 Postprocedural hypothyroidism: Secondary | ICD-10-CM

## 2015-08-03 DIAGNOSIS — IMO0002 Reserved for concepts with insufficient information to code with codable children: Secondary | ICD-10-CM

## 2015-08-03 DIAGNOSIS — E038 Other specified hypothyroidism: Secondary | ICD-10-CM | POA: Diagnosis not present

## 2015-08-03 LAB — COMPREHENSIVE METABOLIC PANEL
ALT: 6 U/L (ref 0–35)
AST: 9 U/L (ref 0–37)
Albumin: 3.3 g/dL — ABNORMAL LOW (ref 3.5–5.2)
Alkaline Phosphatase: 50 U/L (ref 39–117)
BUN: 38 mg/dL — ABNORMAL HIGH (ref 6–23)
CO2: 22 mEq/L (ref 19–32)
Calcium: 9 mg/dL (ref 8.4–10.5)
Chloride: 109 mEq/L (ref 96–112)
Creatinine, Ser: 1.85 mg/dL — ABNORMAL HIGH (ref 0.40–1.20)
GFR: 35.14 mL/min — ABNORMAL LOW (ref >60.00)
Glucose, Bld: 124 mg/dL — ABNORMAL HIGH (ref 70–99)
Potassium: 4.2 mEq/L (ref 3.5–5.1)
Sodium: 138 mEq/L (ref 135–145)
Total Bilirubin: 0.4 mg/dL (ref 0.2–1.2)
Total Protein: 6.4 g/dL (ref 6.0–8.3)

## 2015-08-03 LAB — T4, FREE: Free T4: 0.74 ng/dL (ref 0.60–1.60)

## 2015-08-03 LAB — TSH: TSH: 0.26 u[IU]/mL — ABNORMAL LOW (ref 0.35–4.50)

## 2015-08-04 ENCOUNTER — Other Ambulatory Visit: Payer: Medicare Other

## 2015-08-08 ENCOUNTER — Ambulatory Visit: Payer: Medicare Other | Admitting: Gastroenterology

## 2015-08-09 ENCOUNTER — Encounter: Payer: Self-pay | Admitting: Endocrinology

## 2015-08-09 ENCOUNTER — Ambulatory Visit (INDEPENDENT_AMBULATORY_CARE_PROVIDER_SITE_OTHER): Payer: Medicare Other | Admitting: Endocrinology

## 2015-08-09 VITALS — BP 144/90 | HR 91 | Temp 98.2°F | Resp 16 | Ht 65.0 in | Wt 256.6 lb

## 2015-08-09 DIAGNOSIS — E890 Postprocedural hypothyroidism: Secondary | ICD-10-CM

## 2015-08-09 DIAGNOSIS — Z794 Long term (current) use of insulin: Secondary | ICD-10-CM

## 2015-08-09 DIAGNOSIS — N183 Chronic kidney disease, stage 3 unspecified: Secondary | ICD-10-CM

## 2015-08-09 DIAGNOSIS — E1165 Type 2 diabetes mellitus with hyperglycemia: Secondary | ICD-10-CM

## 2015-08-09 DIAGNOSIS — I10 Essential (primary) hypertension: Secondary | ICD-10-CM | POA: Diagnosis not present

## 2015-08-09 MED ORDER — CLONIDINE HCL 0.1 MG PO TABS: 0.1000 mg | ORAL_TABLET | Freq: Two times a day (BID) | ORAL | Status: DC

## 2015-08-09 NOTE — Patient Instructions (Addendum)
Check blood sugars on waking up 2-3  times a week Also check blood sugars about 2 hours after a meal and do this after different meals by rotation  Recommended blood sugar levels on waking up is 90-130 and about 2 hours after meal is 130-160  Please bring your blood sugar monitor to each visit, thank you  HUMALOG 8 AT bFST and 10-12 before PM meal  LASIX 1/2 DAILY and if needed take 40mg   Thyroid 1/2 on Sundays, 1 on other days

## 2015-08-09 NOTE — Progress Notes (Signed)
[name] is a 64 y.o. female.    Reason for Appointment: Diabetes follow-up   History of Present Illness   Diagnosis: Type 2 DIABETES MELITUS, date of diagnosis: 2000  Previous history: She had initially been treated with metformin and Amaryl and subsequently given Victoza which helped overall control and weight loss. In 9/13 because of significant hyperglycemia she was started on basal bolus insulin also. Had required relatively small doses of insulin. Her blood sugar control has been excellent with A1c between 5.3-6.0 although this may be relatively higher than expected. Tends to have relatively higher readings after supper In 6/15 she was told to resume her Victoza and stop her Lantus since she had been gaining weight with Lantus and NovoLog regimen.   Recent history:     INSULIN: Humalog 10 units twice a day at 26 NOON, 7 PM  She has gone back to taking Victoza but is still taking Humalog at meals; she is getting this from her daughter as she cannot afford her Novolog prescription Also she was told to stop metformin when her renal function was worse Since her sugars were higher on her last visit her A1c was also increased by 1%; this usually is lower than expected for her readings  Current blood sugar patterns and management:  Fasting blood sugars are improved significantly with starting Victoza and she continues to take 1 mg Amaryl in the evening  She has had a couple of high readings with taking a steroid injection; however she checks her blood sugars only sporadically and not more frequently when that sugars are high from steroids  She does not take extra Humalog when blood sugars are high with steroids  She is usually not checking blood sugars after lunch when she takes Humalog  Has mildly increased blood sugars at bedtime but not checked recently  Not able to exercise because of various issues     Non-insulin hypoglycemic drugs: Amaryl  1 mg in PM ,  Victoza 1.2 mg daily   Side effects from medications:  none  Monitors blood glucose: Once a day or less on average .    Glucometer:  Accu-Chek compact.          Blood Glucose readings from monitor download:  Mean values apply above for all meters except median for One Touch  PRE-MEAL Fasting Lunch  3 PM  Bedtime Overall  Glucose range:  82-240   85-177   167, 268   122-171    Mean/median:      147    Hypoglycemia: None    Meals: 1-3 meals per day. Supper at 5-7 PM pm          Physical activity: exercise: Limited, has had fatigue, back and leg pain.           Dietician visit: Most recent: [id number]           Complications: are: None  Wt Readings from Last 3 Encounters:  08/09/15 256 lb 9.6 oz (116.393 kg)  07/25/15 248 lb (112.492 kg)  07/17/15 251 lb (113.853 kg)      LABS:  Lab Results  Component Value Date   HGBA1C 6.7* 06/27/2015   HGBA1C 5.7 03/01/2015   HGBA1C 6.4 09/26/2014   Lab Results  Component Value Date   MICROALBUR <0.7 06/27/2015   [place] 93 03/01/2015   CREATININE 1.85* 08/03/2015    Appointment on 08/03/2015  Component Date Value Ref Range Status  . Sodium 08/03/2015 138  135 -  145 mEq/L Final  . Potassium 08/03/2015 4.2  3.5 - 5.1 mEq/L Final  . Chloride 08/03/2015 109  96 - 112 mEq/L Final  . CO2 08/03/2015 22  19 - 32 mEq/L Final  . Glucose, Bld 08/03/2015 124* 70 - 99 mg/dL Final  . BUN 08/03/2015 38* 6 - 23 mg/dL Final  . Creatinine, Ser 08/03/2015 1.85* 0.40 - 1.20 mg/dL Final  . Total Bilirubin 08/03/2015 0.4  0.2 - 1.2 mg/dL Final  . Alkaline Phosphatase 08/03/2015 50  39 - 117 U/L Final  . AST 08/03/2015 9  0 - 37 U/L Final  . ALT 08/03/2015 6  0 - 35 U/L Final  . Total Protein 08/03/2015 6.4  6.0 - 8.3 g/dL Final  . Albumin 08/03/2015 3.3* 3.5 - 5.2 g/dL Final  . Calcium 08/03/2015 9.0  8.4 - 10.5 mg/dL Final  . GFR 08/03/2015 35.14* >60.00 mL/min Final  . TSH 08/03/2015 0.26* 0.35 - 4.50 uIU/mL Final  . Free T4 08/03/2015 0.74  0.60  - 1.60 ng/dL Final      Medication List       This list is accurate as of: 08/09/15  1:59 PM.  Always use your most recent med list.               AMBULATORY NON FORMULARY MEDICATION  Domperidone 10 mg    Take one tablet by mouth twice a day before meals     amitriptyline 10 MG tablet  Commonly known as:  ELAVIL     amLODipine-valsartan 10-320 MG tablet  Commonly known as:  EXFORGE  Take 1 tablet by mouth daily.     aspirin 81 MG EC tablet  Take 81 mg by mouth daily.     ELMIRON 100 MG capsule  Generic drug:  pentosan polysulfate  Take 100 mg by mouth 2 (two) times daily.     estradiol 2 MG tablet  Commonly known as:  ESTRACE  Take 1 tablet (2 mg total) by mouth daily.     FLUoxetine 20 MG capsule  Commonly known as:  PROZAC  TAKE 1 CAPSULE (20 MG TOTAL) BY MOUTH DAILY.     furosemide 40 MG tablet  Commonly known as:  LASIX  Take 1 tablet (40 mg total) by mouth daily.     gabapentin 600 MG tablet  Commonly known as:  NEURONTIN  Take 600 mg by mouth 2 (two) times daily.     glimepiride 1 MG tablet  Commonly known as:  AMARYL  TAKE 1 TABLET BY MOUTH WITH SUPPER ONCE A DAY PER DR. Dwyane Dee     glucose blood test strip  Commonly known as:  ACCU-CHEK COMPACT PLUS  USE AS INSTRUCTED TO CHECK BLOOD SUGARS ONCE A DAY DX CODE E11.9     isosorbide dinitrate 10 MG tablet  Commonly known as:  ISORDIL  TAKE ONE TABLET BY MOUTH DAILY     levofloxacin 250 MG tablet  Commonly known as:  LEVAQUIN  Take 1 tablet (250 mg total) by mouth daily.     levothyroxine 100 MCG tablet  Commonly known as:  SYNTHROID, LEVOTHROID     levothyroxine 88 MCG tablet  Commonly known as:  SYNTHROID, LEVOTHROID  Take 1 tablet (88 mcg total) by mouth daily.     lidocaine 5 %  Commonly known as:  LIDODERM     Linaclotide 145 MCG Caps capsule  Commonly known as:  LINZESS  Take 1 capsule (145 mcg total) by mouth every other day.  metoprolol 50 MG tablet  Commonly known as:  LOPRESSOR   Take one and one-half tablets twice a day     metoprolol succinate 50 MG 24 hr tablet  Commonly known as:  TOPROL-XL     metroNIDAZOLE 500 MG tablet  Commonly known as:  FLAGYL  Take 1 tablet (500 mg total) by mouth 3 (three) times daily.     nitrofurantoin 100 MG capsule  Commonly known as:  MACRODANTIN     NOVOLOG FLEXPEN 100 UNIT/ML FlexPen  Generic drug:  insulin aspart  Inject 10 Units into the skin 3 (three) times daily with meals.     omeprazole 20 MG capsule  Commonly known as:  PRILOSEC  Take 1 capsule (20 mg total) by mouth daily.     ondansetron 4 MG tablet  Commonly known as:  ZOFRAN  Take 1 tab every 6 hours as needed for nausea.     oxyCODONE 5 MG immediate release tablet  Commonly known as:  Oxy IR/ROXICODONE  Take 1 tablet (5 mg total) by mouth every 4 (four) hours as needed for severe pain.     simvastatin 20 MG tablet  Commonly known as:  ZOCOR  TAKE 1 TABLET BY MOUTH DAILY     sucralfate 1 GM/10ML suspension  Commonly known as:  CARAFATE  Take 2 teaspoons three times daily before meals.     trimethoprim 100 MG tablet  Commonly known as:  TRIMPEX  Take 100 mg by mouth daily.     VICTOZA 18 MG/3ML Sopn  Generic drug:  Liraglutide  Inject 0.2 mLs (1.2 mg total) into the skin daily. Inject once daily at the same time        Allergies:  Allergies  Allergen Reactions  . Hydrocodone Other (See Comments)    tachycardia  . Ciprofloxacin Other (See Comments)    dizzy  . Penicillins Hives    Past Medical History  Diagnosis Date  . COMMON MIGRAINE   . DIVERTICULOSIS-COLON   . CORONARY ARTERY DISEASE   . Gastroparesis   . INSOMNIA-SLEEP DISORDER-UNSPEC   . Iron deficiency anemia   . Anxiety   . DEPRESSION   . Diabetes mellitus, type II ([place])   . GERD (gastroesophageal reflux disease)   . Hyperlipidemia   . Hypertension   . Gastric ulcer 04/2008  . CERVICAL RADICULOPATHY, LEFT   . Hiatal hernia   . Hypothyroidism   . Arthritis      Past Surgical History  Procedure Laterality Date  . Left knee replacement    . Rigth knee replacement with revision  04/2008    Dr. Berenice Primas  . Gastric wedge resection lipoma  11/2007    x2 with laparotomy and gastrostomy  . Rotator cuff repair  01/2009  . Cholecystectomy  06/2009  . S/p bladder surgury  09/2009    Dr. Terance Hart  . Abdominal hysterectomy      Family History  Problem Relation Age of Onset  . Diabetes Sister     x 3  . Heart disease Sister     x2  . Diabetes Brother     x3  . Heart disease Brother     x2  . Coronary artery disease Other     female 1st degree  . Hypertension Other   . Colon cancer Neg Hx     Social History:  reports that she has quit smoking. She has never used smokeless tobacco. She reports that she does not drink alcohol or use illicit drugs.  Review of Systems:  Hypertension: Her blood pressure is treated with metoprolol and Exforge Not monitoring blood pressure at home Blood pressure is not being controlled recently. She was told to start clonidine patch but she thinks it was too expensive Also despite her having some swelling in her legs she is not taking any Lasix  Mild CKD: Her creatinine is  persistently higher and she apparently has had no specific diagnosis made by the nephrologist Creatinine level has been variably higher this year, she is going to see nephrologist next month  Lab Results  Component Value Date   CREATININE 1.85* 08/03/2015   BUN 38* 08/03/2015   NA 138 08/03/2015   K 4.2 08/03/2015   CL 109 08/03/2015   CO2 22 08/03/2015    Lipids: Usually well controlled with simvastatin   Lab Results  Component Value Date   CHOL 171 03/01/2015   HDL 61.10 03/01/2015   LDLCALC 93 03/01/2015   LDLDIRECT 156.6 01/07/2011   TRIG 86.0 03/01/2015   CHOLHDL 3 03/01/2015     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 Previously she had I-131 treatment on 08/20/11 for Graves' disease    She does feel tired and this appears to be chronic  Even though her dose was reduced her TSH is still relatively low   Lab Results  Component Value Date   TSH 0.26* 08/03/2015   TSH 0.19* 06/27/2015   TSH 0.19* 03/01/2015   FREET4 0.74 08/03/2015   FREET4 0.80 06/27/2015   FREET4 1.06 03/01/2015     She was having hot flashes and her gynecologist increased her estradiol with relief    Examination:   BP 144/90 mmHg  Pulse 91  Temp(Src) 98.2 F (36.8 C)  Resp 16  Ht 5\' 5"  (1.651 m)  Wt 256 lb 9.6 oz (116.393 kg)  BMI 42.70 kg/m2  SpO2 97%  Body mass index is 42.7 kg/(m^2).   2+ ankle edema present Otherwise foot exam normal   ASSESSMENT/ PLAN:   Diabetes type 2, uncontrolled :   See history of present illness for detailed discussion of his current management, blood sugar patterns and problems identified Her blood sugars are improving with starting Victoza especially fasting readings Although she was not supposed to continue Humalog she is still taking this but is not getting hypoglycemia. However she is not checking enough readings after meals especially after her lunch and has only some readings at bedtime a few days ago  She tends to have high readings the steroid injections but she does not take extra Humalog for this Still not able to exercise because of musculoskeletal problems She will continue her Amaryl in the evening which probably is helping overnight blood sugars  Recommendations made:  Monitor blood sugars more consistently including 2 hours after meals  Continue 1.2 mg Victoza  Will try to reduce her Humalog dose at least at lunchtime  Discussed postprandial targets and need to check more readings after eating  To take Humalog right before eating if possible  Hypothyroidism: TSH appears to be persistently low even with reducing the dose to 88 by Santiago Glad For now she will continue the same prescription but take 6-1/2 tablets per week TSH needs to  be rechecked on her follow-up visit   HYPERTENSION: Blood pressure is poorly controlled and this is likely because of her not taking the clonidine and not taking any Lasix,: Also has resultant edema Since she cannot afford the Catapres patch she will  start clonidine 0.1 mg twice a day and follow-up with PCP  Renal insufficiency: Etiology unclear and she will follow-up with nephrologist again.  Does not have diabetic nephropathy   Preventive care: She is due for Prevnar, will defer to PCP  Counseling time on subjects discussed above is over 50% of today's 25 minute visit  Patient Instructions  Check blood sugars on waking up 2-3  times a week Also check blood sugars about 2 hours after a meal and do this after different meals by rotation  Recommended blood sugar levels on waking up is 90-130 and about 2 hours after meal is 130-160  Please bring your blood sugar monitor to each visit, thank you  HUMALOG 8 AT bFST and 10-12 before PM meal  LASIX 1/2 DAILY and if needed take 40mg   Thyroid 1/2 on Sundays, 1 on other days     [facility] 08/09/2015, 1:59 PM

## 2015-08-10 ENCOUNTER — Other Ambulatory Visit: Payer: Self-pay | Admitting: Gynecology

## 2015-08-10 DIAGNOSIS — Z780 Asymptomatic menopausal state: Secondary | ICD-10-CM

## 2015-08-21 DIAGNOSIS — K921 Melena: Secondary | ICD-10-CM | POA: Diagnosis not present

## 2015-08-21 DIAGNOSIS — K5909 Other constipation: Secondary | ICD-10-CM | POA: Diagnosis not present

## 2015-08-28 ENCOUNTER — Ambulatory Visit (INDEPENDENT_AMBULATORY_CARE_PROVIDER_SITE_OTHER): Payer: Medicare Other

## 2015-08-28 DIAGNOSIS — Z780 Asymptomatic menopausal state: Secondary | ICD-10-CM

## 2015-09-03 ENCOUNTER — Other Ambulatory Visit: Payer: Self-pay | Admitting: Internal Medicine

## 2015-09-06 ENCOUNTER — Other Ambulatory Visit: Payer: Self-pay | Admitting: Endocrinology

## 2015-09-18 DIAGNOSIS — N183 Chronic kidney disease, stage 3 (moderate): Secondary | ICD-10-CM | POA: Diagnosis not present

## 2015-09-18 DIAGNOSIS — I10 Essential (primary) hypertension: Secondary | ICD-10-CM | POA: Diagnosis not present

## 2015-09-18 DIAGNOSIS — E119 Type 2 diabetes mellitus without complications: Secondary | ICD-10-CM | POA: Diagnosis not present

## 2015-10-04 ENCOUNTER — Other Ambulatory Visit: Payer: Self-pay | Admitting: Endocrinology

## 2015-10-05 DIAGNOSIS — N183 Chronic kidney disease, stage 3 (moderate): Secondary | ICD-10-CM | POA: Diagnosis not present

## 2015-10-31 ENCOUNTER — Telehealth: Payer: Self-pay | Admitting: Endocrinology

## 2015-10-31 NOTE — Telephone Encounter (Signed)
Patient called stating that she would like to speak with Suanne Marker its very important    Thank you

## 2015-10-31 NOTE — Telephone Encounter (Signed)
Patient would like Suanne Marker to please call her as soon as she gets in the morning    Thank you

## 2015-10-31 NOTE — Telephone Encounter (Signed)
Message left for patient to return call.

## 2015-11-01 ENCOUNTER — Other Ambulatory Visit (INDEPENDENT_AMBULATORY_CARE_PROVIDER_SITE_OTHER): Payer: Medicare Other

## 2015-11-01 ENCOUNTER — Other Ambulatory Visit: Payer: Medicare Other

## 2015-11-01 DIAGNOSIS — Z794 Long term (current) use of insulin: Secondary | ICD-10-CM | POA: Diagnosis not present

## 2015-11-01 DIAGNOSIS — E1165 Type 2 diabetes mellitus with hyperglycemia: Secondary | ICD-10-CM

## 2015-11-01 LAB — BASIC METABOLIC PANEL
BUN: 28 mg/dL — ABNORMAL HIGH (ref 6–23)
CO2: 22 mEq/L (ref 19–32)
Calcium: 8.8 mg/dL (ref 8.4–10.5)
Chloride: 106 mEq/L (ref 96–112)
Creatinine, Ser: 1.95 mg/dL — ABNORMAL HIGH (ref 0.40–1.20)
GFR: 33.04 mL/min — ABNORMAL LOW (ref >60.00)
Glucose, Bld: 204 mg/dL — ABNORMAL HIGH (ref 70–99)
Potassium: 4.5 mEq/L (ref 3.5–5.1)
Sodium: 135 mEq/L (ref 135–145)

## 2015-11-01 LAB — TSH: TSH: 0.83 u[IU]/mL (ref 0.35–4.50)

## 2015-11-01 LAB — HEMOGLOBIN A1C: Hgb A1c MFr Bld: 5.6 % (ref 4.6–6.5)

## 2015-11-01 LAB — T4, FREE: Free T4: 0.76 ng/dL (ref 0.60–1.60)

## 2015-11-01 NOTE — Telephone Encounter (Signed)
I spoke with Mrs. Scheuring, she said after she eats she gets goes to sleep, she's wondering if it could be her thyroid?  She is coming in today for her labs.

## 2015-11-01 NOTE — Progress Notes (Signed)
Quick Note:  Please let patient know that the thyroid level is normal, will review other problems on her office visit ______

## 2015-11-01 NOTE — Telephone Encounter (Signed)
Being checked

## 2015-11-06 ENCOUNTER — Other Ambulatory Visit: Payer: Self-pay | Admitting: Gastroenterology

## 2015-11-09 ENCOUNTER — Ambulatory Visit: Payer: Medicare Other | Admitting: Endocrinology

## 2015-11-14 DIAGNOSIS — E669 Obesity, unspecified: Secondary | ICD-10-CM | POA: Diagnosis not present

## 2015-11-14 DIAGNOSIS — N390 Urinary tract infection, site not specified: Secondary | ICD-10-CM | POA: Diagnosis not present

## 2015-11-14 DIAGNOSIS — E119 Type 2 diabetes mellitus without complications: Secondary | ICD-10-CM | POA: Diagnosis not present

## 2015-11-14 DIAGNOSIS — I2510 Atherosclerotic heart disease of native coronary artery without angina pectoris: Secondary | ICD-10-CM | POA: Diagnosis not present

## 2015-11-14 DIAGNOSIS — I129 Hypertensive chronic kidney disease with stage 1 through stage 4 chronic kidney disease, or unspecified chronic kidney disease: Secondary | ICD-10-CM | POA: Diagnosis not present

## 2015-11-14 DIAGNOSIS — E785 Hyperlipidemia, unspecified: Secondary | ICD-10-CM | POA: Diagnosis not present

## 2015-11-14 DIAGNOSIS — E039 Hypothyroidism, unspecified: Secondary | ICD-10-CM | POA: Diagnosis not present

## 2015-11-15 ENCOUNTER — Ambulatory Visit (INDEPENDENT_AMBULATORY_CARE_PROVIDER_SITE_OTHER): Payer: Medicare Other | Admitting: Endocrinology

## 2015-11-15 ENCOUNTER — Encounter: Payer: Self-pay | Admitting: Endocrinology

## 2015-11-15 VITALS — BP 122/76 | HR 78 | Temp 98.3°F | Resp 16 | Ht 65.0 in | Wt 257.2 lb

## 2015-11-15 DIAGNOSIS — Z794 Long term (current) use of insulin: Secondary | ICD-10-CM

## 2015-11-15 DIAGNOSIS — E1165 Type 2 diabetes mellitus with hyperglycemia: Secondary | ICD-10-CM | POA: Diagnosis not present

## 2015-11-15 DIAGNOSIS — R3989 Other symptoms and signs involving the genitourinary system: Secondary | ICD-10-CM | POA: Diagnosis not present

## 2015-11-15 DIAGNOSIS — E890 Postprocedural hypothyroidism: Secondary | ICD-10-CM

## 2015-11-15 DIAGNOSIS — Z0000 Encounter for general adult medical examination without abnormal findings: Secondary | ICD-10-CM | POA: Diagnosis not present

## 2015-11-15 NOTE — Patient Instructions (Addendum)
Check blood sugars on waking up  2 times a week Also check blood sugars about 2 hours after a meal and do this after different meals by rotation  Recommended blood sugar levels on waking up is 90-130 and about 2 hours after meal is 130-160  Please bring your blood sugar monitor to each visit, thank you  Take 2 Glimeperide at supper

## 2015-11-15 NOTE — Progress Notes (Signed)
[name] is a 65 y.o. female.    Reason for Appointment: Diabetes follow-up   History of Present Illness   Diagnosis: Type 2 DIABETES MELITUS, date of diagnosis: 2000  Previous history: She had initially been treated with metformin and Amaryl and subsequently given Victoza which helped overall control and weight loss. In 9/13 because of significant hyperglycemia she was started on basal bolus insulin also. Had required relatively small doses of insulin. Her blood sugar control has been excellent with A1c between 5.3-6.0 although this may be relatively higher than expected. Tends to have relatively higher readings after supper In 6/15 she was told to resume her Victoza and stop her Lantus since she had been gaining weight with Lantus and NovoLog regimen.   Recent history:     INSULIN: Humalog 10 units twice a day at 12pm-2pm, 7 PM   Non-insulin hypoglycemic drugs: Amaryl 1 mg acs , Victoza 1.2 mg daily    Her A1c is lower than expected for her blood sugar levels and now down to 5.6 Despite her though A1c her blood sugars at home are averaging 156  Current blood sugar patterns and management:  Fasting blood sugars are difficult to assess as she usually does not check these, had a few readings in 1/15 around 1-2 PM which were near normal  Appears that recently if she is not eating until the afternoon her blood sugars go up including in the lab and glucose was 204  She is compliant with her Victoza daily  She takes Amaryl low-dose at suppertime  Not taking metformin because of renal dysfunction  She thinks her appetite is fairly good  She takes the same dose of Humalog at both her meals even though she may not eat as much at breakfast  Not able to exercise because of various issues    Side effects from medications:  none  Monitors blood glucose: Once a day or less on average .    Glucometer:  Accu-Chek compact.          Blood Glucose readings from monitor  download:  Mean values apply above for all meters except median for One Touch  PRE-MEAL Fasting Lunch Dinner Bedtime Overall  Glucose range:  102-199     154-200    Mean/median:      156    POST-MEAL PC Breakfast PC Lunch PC Dinner  Glucose range:    144, 173   Mean/median:       Hypoglycemia: None    Meals: 1-2 meals per day. Supper at 7 PM pm          Physical activity: exercise: Limited, has had fatigue, back and leg pain.           Dietician visit: Most recent: [id number]           Complications: are: None  Wt Readings from Last 3 Encounters:  11/15/15 257 lb 3.2 oz (116.665 kg)  08/09/15 256 lb 9.6 oz (116.393 kg)  07/25/15 248 lb (112.492 kg)    LABS:  Lab Results  Component Value Date   HGBA1C 5.6 11/01/2015   HGBA1C 6.7* 06/27/2015   HGBA1C 5.7 03/01/2015   Lab Results  Component Value Date   MICROALBUR <0.7 06/27/2015   LDLCALC 93 03/01/2015   CREATININE 1.95* 11/01/2015    No visits with results within 1 Week(s) from this visit. Latest known visit with results is:  Appointment on 11/01/2015  Component Date Value Ref Range Status  .  Hgb A1c MFr Bld 11/01/2015 5.6  4.6 - 6.5 % Final   Glycemic Control Guidelines for People with Diabetes:Non Diabetic:  <6%Goal of Therapy: <7%Additional Action Suggested:  >8%   . Sodium 11/01/2015 135  135 - 145 mEq/L Final  . Potassium 11/01/2015 4.5  3.5 - 5.1 mEq/L Final  . Chloride 11/01/2015 106  96 - 112 mEq/L Final  . CO2 11/01/2015 22  19 - 32 mEq/L Final  . Glucose, Bld 11/01/2015 204* 70 - 99 mg/dL Final  . BUN 11/01/2015 28* 6 - 23 mg/dL Final  . Creatinine, Ser 11/01/2015 1.95* 0.40 - 1.20 mg/dL Final  . Calcium 11/01/2015 8.8  8.4 - 10.5 mg/dL Final  . GFR 11/01/2015 33.04* >60.00 mL/min Final  . TSH 11/01/2015 0.83  0.35 - 4.50 uIU/mL Final  . Free T4 11/01/2015 0.76  0.60 - 1.60 ng/dL Final      Medication List       This list is accurate as of: 11/15/15  2:32 PM.  Always use your most recent med list.                 AMBULATORY NON FORMULARY MEDICATION  Domperidone 10 mg    Take one tablet by mouth twice a day before meals     amitriptyline 10 MG tablet  Commonly known as:  ELAVIL     amLODipine-valsartan 10-320 MG tablet  Commonly known as:  EXFORGE  Take 1 tablet by mouth daily.     aspirin 81 MG EC tablet  Take 81 mg by mouth daily.     cloNIDine 0.1 MG tablet  Commonly known as:  CATAPRES  Take 1 tablet (0.1 mg total) by mouth 2 (two) times daily.     ELMIRON 100 MG capsule  Generic drug:  pentosan polysulfate  Take 100 mg by mouth 2 (two) times daily.     estradiol 2 MG tablet  Commonly known as:  ESTRACE  Take 1 tablet (2 mg total) by mouth daily.     FLUoxetine 20 MG capsule  Commonly known as:  PROZAC  Take 1 capsule (20 mg total) by mouth daily.     gabapentin 600 MG tablet  Commonly known as:  NEURONTIN  Take 600 mg by mouth 2 (two) times daily.     glimepiride 1 MG tablet  Commonly known as:  AMARYL  TAKE 1 TABLET BY MOUTH WITH SUPPER ONCE A DAY PER DR. Dwyane Dee     glucose blood test strip  Commonly known as:  ACCU-CHEK COMPACT PLUS  USE AS INSTRUCTED TO CHECK BLOOD SUGARS ONCE A DAY DX CODE E11.9     isosorbide dinitrate 10 MG tablet  Commonly known as:  ISORDIL  TAKE ONE TABLET BY MOUTH DAILY     levothyroxine 88 MCG tablet  Commonly known as:  SYNTHROID, LEVOTHROID  TAKE 1 TABLET (88 MCG TOTAL) BY MOUTH DAILY.     lidocaine 5 %  Commonly known as:  LIDODERM  Reported on 11/15/2015     Linaclotide 145 MCG Caps capsule  Commonly known as:  LINZESS  Take 1 capsule (145 mcg total) by mouth every other day.     metoprolol 50 MG tablet  Commonly known as:  LOPRESSOR  Take one and one-half tablets twice a day     metoprolol succinate 50 MG 24 hr tablet  Commonly known as:  TOPROL-XL     metroNIDAZOLE 500 MG tablet  Commonly known as:  FLAGYL  Take 1 tablet (500  mg total) by mouth 3 (three) times daily.     nitrofurantoin 100 MG capsule   Commonly known as:  MACRODANTIN  Reported on 11/15/2015     NOVOLOG FLEXPEN 100 UNIT/ML FlexPen  Generic drug:  insulin aspart  Inject 10 Units into the skin 3 (three) times daily with meals.     omeprazole 20 MG capsule  Commonly known as:  PRILOSEC  Take 1 capsule (20 mg total) by mouth daily.     ondansetron 4 MG tablet  Commonly known as:  ZOFRAN  Take 1 tab every 6 hours as needed for nausea.     oxyCODONE 5 MG immediate release tablet  Commonly known as:  Oxy IR/ROXICODONE  Take 1 tablet (5 mg total) by mouth every 4 (four) hours as needed for severe pain.     simvastatin 20 MG tablet  Commonly known as:  ZOCOR  Take 1 tablet (20 mg total) by mouth daily.     sucralfate 1 GM/10ML suspension  Commonly known as:  CARAFATE  Take 2 teaspoons three times daily before meals.     TRIAMTERENE PO  Take 20 mg by mouth.     trimethoprim 100 MG tablet  Commonly known as:  TRIMPEX  Take 100 mg by mouth daily.     VICTOZA 18 MG/3ML Sopn  Generic drug:  Liraglutide  Inject 0.2 mLs (1.2 mg total) into the skin daily. Inject once daily at the same time        Allergies:  Allergies  Allergen Reactions  . Hydrocodone Other (See Comments)    tachycardia  . Ciprofloxacin Other (See Comments)    dizzy  . Penicillins Hives    Past Medical History  Diagnosis Date  . COMMON MIGRAINE   . DIVERTICULOSIS-COLON   . CORONARY ARTERY DISEASE   . Gastroparesis   . INSOMNIA-SLEEP DISORDER-UNSPEC   . Iron deficiency anemia   . Anxiety   . DEPRESSION   . Diabetes mellitus, type II ([place])   . GERD (gastroesophageal reflux disease)   . Hyperlipidemia   . Hypertension   . Gastric ulcer 04/2008  . CERVICAL RADICULOPATHY, LEFT   . Hiatal hernia   . Hypothyroidism   . Arthritis     Past Surgical History  Procedure Laterality Date  . Left knee replacement    . Rigth knee replacement with revision  04/2008    Dr. Berenice Primas  . Gastric wedge resection lipoma  11/2007    x2 with  laparotomy and gastrostomy  . Rotator cuff repair  01/2009  . Cholecystectomy  06/2009  . S/p bladder surgury  09/2009    Dr. Terance Hart  . Abdominal hysterectomy      Family History  Problem Relation Age of Onset  . Diabetes Sister     x 3  . Heart disease Sister     x2  . Diabetes Brother     x3  . Heart disease Brother     x2  . Coronary artery disease Other     female 1st degree  . Hypertension Other   . Colon cancer Neg Hx     Social History:  reports that she has quit smoking. She has never used smokeless tobacco. She reports that she does not drink alcohol or use illicit drugs.  Review of Systems:  Hypertension: Her blood pressure is treated with metoprolol, clonidine and Exforge Not monitoring blood pressure at home Blood pressure is fairly good now  She has been given diuretics by her  nephrologist  Mild CKD: Her creatinine is  persistently higher and she apparently has had no specific diagnosis made by the nephrologist Creatinine level has been recently about the same   Lab Results  Component Value Date   CREATININE 1.95* 11/01/2015   CREATININE 1.85* 08/03/2015   CREATININE 1.94* 07/25/2015   CREATININE 1.93* 06/27/2015     Lab Results  Component Value Date   CREATININE 1.95* 11/01/2015   BUN 28* 11/01/2015   NA 135 11/01/2015   K 4.5 11/01/2015   CL 106 11/01/2015   CO2 22 11/01/2015    Lipids: Usually well controlled with simvastatin   Lab Results  Component Value Date   CHOL 171 03/01/2015   HDL 61.10 03/01/2015   LDLCALC 93 03/01/2015   LDLDIRECT 156.6 01/07/2011   TRIG 86.0 03/01/2015   CHOLHDL 3 03/01/2015     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     ASSESSMENT/ PLAN:   Diabetes type 2, uncontrolled :   See history of present illness for detailed discussion of his current management, blood sugar patterns and problems identified Her blood sugars are overall appearing controlled with A1c better than before However difficult to assess her blood sugars as she is monitoring and frequently and mostly at bedtime Surprisingly her blood sugars are high in the afternoon even without any breakfast or lunch  Does have some high readings at bedtime without any excessive snacks  Recommendations made:  Monitor blood sugars more consistently at various times including 2 hours after meals, at least more frequently 2 weeks before the visit  Continue 1.2 mg Victoza  Will try to take 2 mg Amaryl at suppertime  Call if blood sugars consistently high  To take Humalog right before eating if possible  Hypothyroidism: TSH is back to normal with 88 g  Advised her to follow-up with PCP for various other questions and issues   Patient Instructions  Check blood sugars on waking up  2 times a week Also check blood sugars about 2 hours after a meal and do this after different meals by rotation  Recommended blood sugar levels on waking up is 90-130 and about 2 hours after meal is 130-160  Please bring your blood sugar monitor to each visit, thank you  Take 2 Glimeperide at supper       [facility] 11/15/2015, 2:32 PM

## 2015-11-23 ENCOUNTER — Encounter: Payer: Self-pay | Admitting: Internal Medicine

## 2015-11-23 ENCOUNTER — Ambulatory Visit (INDEPENDENT_AMBULATORY_CARE_PROVIDER_SITE_OTHER): Payer: Medicare Other | Admitting: Internal Medicine

## 2015-11-23 VITALS — BP 130/76 | HR 84 | Temp 98.7°F | Resp 20 | Wt 273.0 lb

## 2015-11-23 DIAGNOSIS — I10 Essential (primary) hypertension: Secondary | ICD-10-CM | POA: Diagnosis not present

## 2015-11-23 DIAGNOSIS — N182 Chronic kidney disease, stage 2 (mild): Secondary | ICD-10-CM | POA: Diagnosis not present

## 2015-11-23 DIAGNOSIS — M545 Low back pain: Secondary | ICD-10-CM

## 2015-11-23 DIAGNOSIS — G4710 Hypersomnia, unspecified: Secondary | ICD-10-CM

## 2015-11-23 DIAGNOSIS — E119 Type 2 diabetes mellitus without complications: Secondary | ICD-10-CM

## 2015-11-23 MED ORDER — OXYCODONE HCL 5 MG PO TABS: 5.0000 mg | ORAL_TABLET | ORAL | Status: DC | PRN

## 2015-11-23 NOTE — Progress Notes (Signed)
Subjective:    Patient ID: [name], female    DOB: 06-25-1951, 65 y.o.   MRN: [id number]  HPI  Here to f/u, c/o week of persistent daytime fatigue and somnolence, snores at night, husband with her today not sure if she stops breathing, but just cant seem to stay awake. Pt denies chest pain, increased sob or doe, wheezing, orthopnea, PND, increased LE swelling, palpitations, dizziness or syncope.  Pt denies new neurological symptoms such as new headache, or facial or extremity weakness or numbness    Has gained significant weight recently Wt Readings from Last 3 Encounters:  11/23/15 273 lb (123.832 kg)  11/15/15 257 lb 3.2 oz (116.665 kg)  08/09/15 256 lb 9.6 oz (116.393 kg)  .Pt continues to have recurring LBP without change in severity, bowel or bladder change, fever, wt loss,  Worsening right back andRLE pain/numbness/weakness, gait change or falls. Has seen ortho, had ESI and PT, and rec'd for surgury but has declines so far, asks for pain med refill Past Medical History  Diagnosis Date  . COMMON MIGRAINE   . DIVERTICULOSIS-COLON   . CORONARY ARTERY DISEASE   . Gastroparesis   . INSOMNIA-SLEEP DISORDER-UNSPEC   . Iron deficiency anemia   . Anxiety   . DEPRESSION   . Diabetes mellitus, type II ([place])   . GERD (gastroesophageal reflux disease)   . Hyperlipidemia   . Hypertension   . Gastric ulcer 04/2008  . CERVICAL RADICULOPATHY, LEFT   . Hiatal hernia   . Hypothyroidism   . Arthritis    Past Surgical History  Procedure Laterality Date  . Left knee replacement    . Rigth knee replacement with revision  04/2008    Dr. Berenice Primas  . Gastric wedge resection lipoma  11/2007    x2 with laparotomy and gastrostomy  . Rotator cuff repair  01/2009  . Cholecystectomy  06/2009  . S/p bladder surgury  09/2009    Dr. Terance Hart  . Abdominal hysterectomy      reports that she has quit smoking. She has never used smokeless tobacco. She reports that she does not drink alcohol or use  illicit drugs. family history includes Coronary artery disease in her other; Diabetes in her brother and sister; Heart disease in her brother and sister; Hypertension in her other. There is no history of Colon cancer. Allergies  Allergen Reactions  . Hydrocodone Other (See Comments)    tachycardia  . Ciprofloxacin Other (See Comments)    dizzy  . Penicillins Hives   Current Outpatient Prescriptions on File Prior to Visit  Medication Sig Dispense Refill  . AMBULATORY NON FORMULARY MEDICATION Domperidone 10 mg    Take one tablet by mouth twice a day before meals 120 tablet 3  . amitriptyline (ELAVIL) 10 MG tablet     . amLODipine-valsartan (EXFORGE) 10-320 MG per tablet Take 1 tablet by mouth daily. 30 tablet 5  . aspirin 81 MG EC tablet Take 81 mg by mouth daily.      . cloNIDine (CATAPRES) 0.1 MG tablet Take 1 tablet (0.1 mg total) by mouth 2 (two) times daily. 60 tablet 3  . ELMIRON 100 MG capsule Take 100 mg by mouth 2 (two) times daily.     Marland Kitchen estradiol (ESTRACE) 2 MG tablet Take 1 tablet (2 mg total) by mouth daily. 30 tablet 11  . FLUoxetine (PROZAC) 20 MG capsule Take 1 capsule (20 mg total) by mouth daily. 90 capsule 1  . gabapentin (NEURONTIN) 600 MG  tablet Take 600 mg by mouth 2 (two) times daily.   1  . glimepiride (AMARYL) 1 MG tablet TAKE 1 TABLET BY MOUTH WITH SUPPER ONCE A DAY PER DR. Dwyane Dee 30 tablet 2  . glucose blood (ACCU-CHEK COMPACT PLUS) test strip USE AS INSTRUCTED TO CHECK BLOOD SUGARS ONCE A DAY DX CODE E11.9 100 each 1  . insulin aspart (NOVOLOG FLEXPEN) 100 UNIT/ML FlexPen Inject 10 Units into the skin 3 (three) times daily with meals.    . isosorbide dinitrate (ISORDIL) 10 MG tablet TAKE ONE TABLET BY MOUTH DAILY 90 tablet 1  . levothyroxine (SYNTHROID, LEVOTHROID) 88 MCG tablet TAKE 1 TABLET (88 MCG TOTAL) BY MOUTH DAILY. 30 tablet 2  . lidocaine (LIDODERM) 5 % Reported on 11/15/2015    . Linaclotide (LINZESS) 145 MCG CAPS capsule Take 1 capsule (145 mcg total) by  mouth every other day. 15 capsule 5  . metoprolol (LOPRESSOR) 50 MG tablet Take one and one-half tablets twice a day 90 tablet 11  . metoprolol succinate (TOPROL-XL) 50 MG 24 hr tablet     . metroNIDAZOLE (FLAGYL) 500 MG tablet Take 1 tablet (500 mg total) by mouth 3 (three) times daily. 30 tablet 0  . nitrofurantoin (MACRODANTIN) 100 MG capsule Reported on 11/15/2015    . omeprazole (PRILOSEC) 20 MG capsule Take 1 capsule (20 mg total) by mouth daily. 30 capsule 11  . ondansetron (ZOFRAN) 4 MG tablet Take 1 tab every 6 hours as needed for nausea. 30 tablet 2  . simvastatin (ZOCOR) 20 MG tablet Take 1 tablet (20 mg total) by mouth daily. 90 tablet 1  . sucralfate (CARAFATE) 1 GM/10ML suspension Take 2 teaspoons three times daily before meals. 5400 mL 5  . TRIAMTERENE PO Take 20 mg by mouth.    . trimethoprim (TRIMPEX) 100 MG tablet Take 100 mg by mouth daily.   1  . VICTOZA 18 MG/3ML SOPN Inject 0.2 mLs (1.2 mg total) into the skin daily. Inject once daily at the same time 2 pen 3   No current facility-administered medications on file prior to visit.    Review of Systems  Constitutional: Negative for unusual diaphoresis or night sweats HENT: Negative for ringing in ear or discharge Eyes: Negative for double vision or worsening visual disturbance.  Respiratory: Negative for choking and stridor.   Gastrointestinal: Negative for vomiting or other signifcant bowel change Genitourinary: Negative for hematuria or change in urine volume.  Musculoskeletal: Negative for other MSK pain or swelling Skin: Negative for color change and worsening wound.  Neurological: Negative for tremors and numbness other than noted  Psychiatric/Behavioral: Negative for decreased concentration or agitation other than above       Objective:   Physical Exam BP 130/76 mmHg  Pulse 84  Temp(Src) 98.7 F (37.1 C) (Oral)  Resp 20  Wt 273 lb (123.832 kg)  SpO2 99% VS noted, obese Constitutional: Pt appears in no  significant distress HENT: Head: NCAT.  Right Ear: External ear normal.  Left Ear: External ear normal.  Eyes: . Pupils are equal, round, and reactive to light. Conjunctivae and EOM are normal Neck: Normal range of motion. Neck supple.  Cardiovascular: Normal rate and regular rhythm.   Pulmonary/Chest: Effort normal and breath sounds without rales or wheezing.  Abd:  Soft, NT, ND, + BS Neurological: Pt is alert. Not confused , motor grossly intact Skin: Skin is warm. No rash, no LE edema Psychiatric: Pt behavior is normal. No agitation.     Assessment &  Plan:

## 2015-11-23 NOTE — Progress Notes (Signed)
Pre visit review using our clinic review tool, if applicable. No additional management support is needed unless otherwise documented below in the visit note. 

## 2015-11-23 NOTE — Patient Instructions (Signed)
Please continue all other medications as before, and refills have been done if requested - the pain medication  Please have the pharmacy call with any other refills you may need.  Please continue your efforts at being more active, low cholesterol diet, and weight control.  You are otherwise up to date with prevention measures today.  Please keep your appointments with your specialists as you may have planned  You will be contacted regarding the referral for: Pulmonary for consideration for a sleep test

## 2015-11-26 NOTE — Assessment & Plan Note (Signed)
stable overall by history and exam, and pt to continue medical treatment as before,  to f/u any worsening symptoms or concerns, for pain med refill

## 2015-11-26 NOTE — Assessment & Plan Note (Signed)
stable overall by history and exam, recent data reviewed with pt, and pt to continue medical treatment as before,  to f/u any worsening symptoms or concerns BP Readings from Last 3 Encounters:  11/23/15 130/76  11/15/15 122/76  08/09/15 144/90

## 2015-11-26 NOTE — Assessment & Plan Note (Signed)
With recent wt gain, high suspicion for osa. For pulm referral, to f/u any worsening symptoms or concerns, urged wt loss

## 2015-11-26 NOTE — Assessment & Plan Note (Signed)
stable overall by history and exam, recent data reviewed with pt, and pt to continue medical treatment as before,  to f/u any worsening symptoms or concerns Lab Results  Component Value Date   CREATININE 1.95* 11/01/2015

## 2015-11-26 NOTE — Assessment & Plan Note (Signed)
stable overall by history and exam, recent data reviewed with pt, and pt to continue medical treatment as before,  to f/u any worsening symptoms or concerns Lab Results  Component Value Date   HGBA1C 5.6 11/01/2015

## 2015-11-28 ENCOUNTER — Other Ambulatory Visit: Payer: Self-pay | Admitting: *Deleted

## 2015-11-28 MED ORDER — CLONIDINE HCL 0.1 MG PO TABS: 0.1000 mg | ORAL_TABLET | Freq: Two times a day (BID) | ORAL | Status: DC

## 2015-11-29 ENCOUNTER — Other Ambulatory Visit: Payer: Self-pay | Admitting: Endocrinology

## 2015-12-13 ENCOUNTER — Other Ambulatory Visit: Payer: Self-pay | Admitting: Gastroenterology

## 2015-12-25 ENCOUNTER — Encounter: Payer: Self-pay | Admitting: Pulmonary Disease

## 2015-12-26 DIAGNOSIS — M5416 Radiculopathy, lumbar region: Secondary | ICD-10-CM | POA: Diagnosis not present

## 2016-01-02 ENCOUNTER — Encounter: Payer: Self-pay | Admitting: Internal Medicine

## 2016-01-02 ENCOUNTER — Ambulatory Visit (INDEPENDENT_AMBULATORY_CARE_PROVIDER_SITE_OTHER): Payer: Medicare Other | Admitting: Internal Medicine

## 2016-01-02 VITALS — BP 140/84 | HR 87 | Temp 98.0°F | Resp 20 | Wt 261.0 lb

## 2016-01-02 DIAGNOSIS — E119 Type 2 diabetes mellitus without complications: Secondary | ICD-10-CM

## 2016-01-02 DIAGNOSIS — I10 Essential (primary) hypertension: Secondary | ICD-10-CM

## 2016-01-02 DIAGNOSIS — N183 Chronic kidney disease, stage 3 (moderate): Secondary | ICD-10-CM | POA: Diagnosis not present

## 2016-01-02 DIAGNOSIS — M545 Low back pain: Secondary | ICD-10-CM | POA: Diagnosis not present

## 2016-01-02 NOTE — Progress Notes (Signed)
Subjective:    Patient ID: [name], female    DOB: 1951/09/12, 65 y.o.   MRN: [id number]  HPI  Here to f/u; overall doing ok,  Pt denies chest pain, increasing sob or doe, wheezing, orthopnea, PND, increased LE swelling, palpitations, dizziness or syncope.  Pt denies new neurological symptoms such as new headache, or facial or extremity weakness or numbness.  Pt denies polydipsia, polyuria, or low sugar episode.   Pt denies new neurological symptoms such as new headache, or facial or extremity weakness or numbness.   Pt states overall good compliance with meds, mostly trying to follow appropriate diet, with wt overall stable,  but little exercise however. Pt continues to have recurring LBP without change in severity, bowel or bladder change, fever, wt loss,  worsening LE pain/numbness/weakness, gait change or falls. Asks for ortho Dr [facility]  Denies urinary symptoms such as dysuria, frequency, urgency, flank pain, hematuria or n/v, fever, chills.  Pt denies polydipsia, polyuria    Past Medical History  Diagnosis Date  . COMMON MIGRAINE   . DIVERTICULOSIS-COLON   . CORONARY ARTERY DISEASE   . Gastroparesis   . INSOMNIA-SLEEP DISORDER-UNSPEC   . Iron deficiency anemia   . Anxiety   . DEPRESSION   . Diabetes mellitus, type II ([place])   . GERD (gastroesophageal reflux disease)   . Hyperlipidemia   . Hypertension   . Gastric ulcer 04/2008  . CERVICAL RADICULOPATHY, LEFT   . Hiatal hernia   . Hypothyroidism   . Arthritis    Past Surgical History  Procedure Laterality Date  . Left knee replacement    . Rigth knee replacement with revision  04/2008    Dr. Berenice Primas  . Gastric wedge resection lipoma  11/2007    x2 with laparotomy and gastrostomy  . Rotator cuff repair  01/2009  . Cholecystectomy  06/2009  . S/p bladder surgury  09/2009    Dr. Terance Hart  . Abdominal hysterectomy      reports that she has quit smoking. She has never used  smokeless tobacco. She reports that she does not drink alcohol or use illicit drugs. family history includes Coronary artery disease in her other; Diabetes in her brother and sister; Heart disease in her brother and sister; Hypertension in her other. There is no history of Colon cancer. Allergies  Allergen Reactions  . Hydrocodone Other (See Comments)    tachycardia  . Ciprofloxacin Other (See Comments)    dizzy  . Penicillins Hives   Current Outpatient Prescriptions on File Prior to Visit  Medication Sig Dispense Refill  . AMBULATORY NON FORMULARY MEDICATION Domperidone 10 mg    Take one tablet by mouth twice a day before meals 120 tablet 3  . amitriptyline (ELAVIL) 10 MG tablet     . amLODipine-valsartan (EXFORGE) 10-320 MG per tablet Take 1 tablet by mouth daily. 30 tablet 5  . aspirin 81 MG EC tablet Take 81 mg by mouth daily.      . cloNIDine (CATAPRES) 0.1 MG tablet Take 1 tablet (0.1 mg total) by mouth 2 (two) times daily. 60 tablet 3  . ELMIRON 100 MG capsule Take 100 mg by mouth 2 (two) times daily.     Marland Kitchen estradiol (ESTRACE) 2 MG tablet Take 1 tablet (2 mg total) by mouth daily. 30 tablet 11  . FLUoxetine (PROZAC) 20 MG capsule Take 1 capsule (20 mg total) by mouth daily. 90 capsule 1  . gabapentin (NEURONTIN)  600 MG tablet Take 600 mg by mouth 2 (two) times daily.   1  . glimepiride (AMARYL) 1 MG tablet TAKE 1 TABLET BY MOUTH WITH SUPPER ONCE A DAY PER DR. Dwyane Dee 30 tablet 2  . glimepiride (AMARYL) 1 MG tablet TAKE 1 TABLET BY MOUTH WITH SUPPER ONCE A DAY PER DR. Dwyane Dee 30 tablet 2  . glucose blood (ACCU-CHEK COMPACT PLUS) test strip USE AS INSTRUCTED TO CHECK BLOOD SUGARS ONCE A DAY DX CODE E11.9 100 each 1  . insulin aspart (NOVOLOG FLEXPEN) 100 UNIT/ML FlexPen Inject 10 Units into the skin 3 (three) times daily with meals.    . isosorbide dinitrate (ISORDIL) 10 MG tablet TAKE ONE TABLET BY MOUTH DAILY 90 tablet 1  . levothyroxine (SYNTHROID, LEVOTHROID) 88 MCG tablet TAKE 1  TABLET (88 MCG TOTAL) BY MOUTH DAILY. 30 tablet 2  . lidocaine (LIDODERM) 5 % Reported on 11/15/2015    . Linaclotide (LINZESS) 145 MCG CAPS capsule Take 1 capsule (145 mcg total) by mouth every other day. 15 capsule 5  . metoprolol (LOPRESSOR) 50 MG tablet Take one and one-half tablets twice a day 90 tablet 11  . metoprolol succinate (TOPROL-XL) 50 MG 24 hr tablet     . metroNIDAZOLE (FLAGYL) 500 MG tablet Take 1 tablet (500 mg total) by mouth 3 (three) times daily. 30 tablet 0  . nitrofurantoin (MACRODANTIN) 100 MG capsule Reported on 11/15/2015    . omeprazole (PRILOSEC) 20 MG capsule TAKE 1 CAPSULE (20 MG TOTAL) BY MOUTH DAILY. 30 capsule 0  . ondansetron (ZOFRAN) 4 MG tablet Take 1 tab every 6 hours as needed for nausea. 30 tablet 2  . oxyCODONE (OXY IR/ROXICODONE) 5 MG immediate release tablet Take 1 tablet (5 mg total) by mouth every 4 (four) hours as needed for severe pain. 60 tablet 0  . simvastatin (ZOCOR) 20 MG tablet Take 1 tablet (20 mg total) by mouth daily. 90 tablet 1  . sucralfate (CARAFATE) 1 GM/10ML suspension Take 2 teaspoons three times daily before meals. 5400 mL 5  . TRIAMTERENE PO Take 20 mg by mouth.    . trimethoprim (TRIMPEX) 100 MG tablet Take 100 mg by mouth daily.   1  . VICTOZA 18 MG/3ML SOPN Inject 0.2 mLs (1.2 mg total) into the skin daily. Inject once daily at the same time 2 pen 3   No current facility-administered medications on file prior to visit.     Review of Systems  Constitutional: Negative for unusual diaphoresis or night sweats HENT: Negative for ear swelling or discharge Eyes: Negative for worsening visual haziness  Respiratory: Negative for choking and stridor.   Gastrointestinal: Negative for distension or worsening eructation Genitourinary: Negative for retention or change in urine volume.  Musculoskeletal: Negative for other MSK pain or swelling Skin: Negative for color change and worsening wound Neurological: Negative for tremors and numbness  other than noted  Psychiatric/Behavioral: Negative for decreased concentration or agitation other than above       Objective:   Physical Exam BP 140/84 mmHg  Pulse 87  Temp(Src) 98 F (36.7 C) (Oral)  Resp 20  Wt 261 lb (118.389 kg)  SpO2 96% VS noted,  Constitutional: Pt appears in no apparent distress HENT: Head: NCAT.  Right Ear: External ear normal.  Left Ear: External ear normal.  Eyes: . Pupils are equal, round, and reactive to light. Conjunctivae and EOM are normal Neck: Normal range of motion. Neck supple.  Cardiovascular: Normal rate and regular rhythm.  Pulmonary/Chest: Effort normal and breath sounds without rales or wheezing.  Abd:  Soft, NT, ND, + BS Neurological: Pt is alert. Not confused , motor grossly intact Skin: Skin is warm. No rash, no LE edema Psychiatric: Pt behavior is normal. No agitation.  Spine diffuse lower lumbar chronic tender in midline and bilat paravertebral    Assessment & Plan:

## 2016-01-02 NOTE — Patient Instructions (Addendum)
Please continue all other medications as before, and refills have been done if requested.  Please have the pharmacy call with any other refills you may need.  Please keep your appointments with your specialists as you may have planned  You will be contacted regarding the referral for: Dr Vira Blanco

## 2016-01-02 NOTE — Progress Notes (Signed)
Pre visit review using our clinic review tool, if applicable. No additional management support is needed unless otherwise documented below in the visit note. 

## 2016-01-05 NOTE — Assessment & Plan Note (Signed)
stable overall by history and exam, recent data reviewed with pt, and pt to continue medical treatment as before,  to f/u any worsening symptoms or concerns BP Readings from Last 3 Encounters:  01/02/16 140/84  11/23/15 130/76  11/15/15 122/76

## 2016-01-05 NOTE — Assessment & Plan Note (Signed)
stable overall by history and exam, recent data reviewed with pt, and pt to continue medical treatment as before,  to f/u any worsening symptoms or concerns Lab Results  Component Value Date   HGBA1C 5.6 11/01/2015

## 2016-01-05 NOTE — Assessment & Plan Note (Signed)
Chronic persistent, mod to occas severe, for pain management referral as requested,  to f/u any worsening symptoms or concerns, cont same tx for now

## 2016-01-15 ENCOUNTER — Other Ambulatory Visit: Payer: Self-pay | Admitting: *Deleted

## 2016-01-15 DIAGNOSIS — M4316 Spondylolisthesis, lumbar region: Secondary | ICD-10-CM | POA: Diagnosis not present

## 2016-01-15 DIAGNOSIS — M5106 Intervertebral disc disorders with myelopathy, lumbar region: Secondary | ICD-10-CM | POA: Diagnosis not present

## 2016-01-15 DIAGNOSIS — M4726 Other spondylosis with radiculopathy, lumbar region: Secondary | ICD-10-CM | POA: Diagnosis not present

## 2016-01-15 MED ORDER — LEVOTHYROXINE SODIUM 88 MCG PO TABS: ORAL_TABLET | ORAL | Status: DC

## 2016-01-16 ENCOUNTER — Telehealth: Payer: Self-pay | Admitting: Internal Medicine

## 2016-01-16 DIAGNOSIS — M5416 Radiculopathy, lumbar region: Secondary | ICD-10-CM | POA: Diagnosis not present

## 2016-01-16 MED ORDER — AMLODIPINE BESYLATE-VALSARTAN 10-320 MG PO TABS: 1.0000 | ORAL_TABLET | Freq: Every day | ORAL | Status: DC

## 2016-01-16 NOTE — Telephone Encounter (Signed)
erx sent to pharmacy

## 2016-01-16 NOTE — Telephone Encounter (Signed)
Pt's spouse Gwyndolyn Saxon called in requesting a refill on pt's amLODipine Rx. He says that the pharmacy has provided temporary pills for pt until. Pt is requesting a call back to discuss further.     Pharmacy: [id number] - [place], [place]: [phone]

## 2016-01-16 NOTE — Telephone Encounter (Signed)
Pt informed rx sent.

## 2016-01-18 DIAGNOSIS — N183 Chronic kidney disease, stage 3 (moderate): Secondary | ICD-10-CM | POA: Diagnosis not present

## 2016-01-22 DIAGNOSIS — N183 Chronic kidney disease, stage 3 (moderate): Secondary | ICD-10-CM | POA: Diagnosis not present

## 2016-01-22 DIAGNOSIS — I10 Essential (primary) hypertension: Secondary | ICD-10-CM | POA: Diagnosis not present

## 2016-01-24 DIAGNOSIS — M5106 Intervertebral disc disorders with myelopathy, lumbar region: Secondary | ICD-10-CM | POA: Diagnosis not present

## 2016-01-24 DIAGNOSIS — M4316 Spondylolisthesis, lumbar region: Secondary | ICD-10-CM | POA: Diagnosis not present

## 2016-01-24 DIAGNOSIS — M4726 Other spondylosis with radiculopathy, lumbar region: Secondary | ICD-10-CM | POA: Diagnosis not present

## 2016-01-25 ENCOUNTER — Institutional Professional Consult (permissible substitution): Payer: Medicare Other | Admitting: Pulmonary Disease

## 2016-02-03 DIAGNOSIS — M25561 Pain in right knee: Secondary | ICD-10-CM | POA: Diagnosis not present

## 2016-02-07 DIAGNOSIS — M5416 Radiculopathy, lumbar region: Secondary | ICD-10-CM | POA: Diagnosis not present

## 2016-02-10 ENCOUNTER — Other Ambulatory Visit: Payer: Self-pay | Admitting: Endocrinology

## 2016-02-12 ENCOUNTER — Ambulatory Visit (INDEPENDENT_AMBULATORY_CARE_PROVIDER_SITE_OTHER): Payer: Medicare Other | Admitting: Endocrinology

## 2016-02-12 ENCOUNTER — Encounter: Payer: Self-pay | Admitting: Endocrinology

## 2016-02-12 VITALS — BP 146/90 | HR 100 | Temp 98.7°F | Ht 65.0 in | Wt 266.0 lb

## 2016-02-12 DIAGNOSIS — E785 Hyperlipidemia, unspecified: Secondary | ICD-10-CM | POA: Diagnosis not present

## 2016-02-12 DIAGNOSIS — E039 Hypothyroidism, unspecified: Secondary | ICD-10-CM | POA: Diagnosis not present

## 2016-02-12 DIAGNOSIS — N390 Urinary tract infection, site not specified: Secondary | ICD-10-CM | POA: Diagnosis not present

## 2016-02-12 DIAGNOSIS — E1165 Type 2 diabetes mellitus with hyperglycemia: Secondary | ICD-10-CM

## 2016-02-12 DIAGNOSIS — I2510 Atherosclerotic heart disease of native coronary artery without angina pectoris: Secondary | ICD-10-CM | POA: Diagnosis not present

## 2016-02-12 DIAGNOSIS — E890 Postprocedural hypothyroidism: Secondary | ICD-10-CM | POA: Diagnosis not present

## 2016-02-12 DIAGNOSIS — E669 Obesity, unspecified: Secondary | ICD-10-CM | POA: Diagnosis not present

## 2016-02-12 DIAGNOSIS — Z794 Long term (current) use of insulin: Secondary | ICD-10-CM

## 2016-02-12 DIAGNOSIS — I129 Hypertensive chronic kidney disease with stage 1 through stage 4 chronic kidney disease, or unspecified chronic kidney disease: Secondary | ICD-10-CM | POA: Diagnosis not present

## 2016-02-12 DIAGNOSIS — E119 Type 2 diabetes mellitus without complications: Secondary | ICD-10-CM | POA: Diagnosis not present

## 2016-02-12 DIAGNOSIS — N189 Chronic kidney disease, unspecified: Secondary | ICD-10-CM | POA: Diagnosis not present

## 2016-02-12 LAB — LIPID PANEL
Cholesterol: 212 mg/dL — ABNORMAL HIGH (ref 0–200)
HDL: 86.2 mg/dL (ref >39.00)
LDL Cholesterol: 103 mg/dL — ABNORMAL HIGH (ref 0–99)
NonHDL: 125.57
Total CHOL/HDL Ratio: 2
Triglycerides: 113 mg/dL (ref 0.0–149.0)
VLDL: 22.6 mg/dL (ref 0.0–40.0)

## 2016-02-12 LAB — COMPREHENSIVE METABOLIC PANEL
ALT: 9 U/L (ref 0–35)
AST: 13 U/L (ref 0–37)
Albumin: 4.1 g/dL (ref 3.5–5.2)
Alkaline Phosphatase: 68 U/L (ref 39–117)
BUN: 12 mg/dL (ref 6–23)
CO2: 24 mEq/L (ref 19–32)
Calcium: 10 mg/dL (ref 8.4–10.5)
Chloride: 102 mEq/L (ref 96–112)
Creatinine, Ser: 1.3 mg/dL — ABNORMAL HIGH (ref 0.40–1.20)
GFR: 52.71 mL/min — ABNORMAL LOW (ref >60.00)
Glucose, Bld: 235 mg/dL — ABNORMAL HIGH (ref 70–99)
Potassium: 4.4 mEq/L (ref 3.5–5.1)
Sodium: 135 mEq/L (ref 135–145)
Total Bilirubin: 0.8 mg/dL (ref 0.2–1.2)
Total Protein: 7.7 g/dL (ref 6.0–8.3)

## 2016-02-12 LAB — TSH: TSH: 5.06 u[IU]/mL — ABNORMAL HIGH (ref 0.35–4.50)

## 2016-02-12 LAB — POCT GLYCOSYLATED HEMOGLOBIN (HGB A1C): Hemoglobin A1C: 6.4

## 2016-02-12 LAB — T4, FREE: Free T4: 0.67 ng/dL (ref 0.60–1.60)

## 2016-02-12 NOTE — Addendum Note (Signed)
Addended by: Verlin Grills T on: 02/12/2016 03:08 PM   Modules accepted: Orders

## 2016-02-12 NOTE — Patient Instructions (Addendum)
Check blood sugars on waking up 2-3  times a week Also check blood sugars about 2 hours after a meal and do this after different meals by rotation  Recommended blood sugar levels on waking up is 90-130 and about 2 hours after meal is 130-160  Please bring your blood sugar monitor to each visit, thank you  Clonidine twice daily

## 2016-02-12 NOTE — Progress Notes (Signed)
[name] is a 65 y.o. female.    Reason for Appointment: Diabetes follow-up   History of Present Illness   Diagnosis: Type 2 DIABETES MELITUS, date of diagnosis: 2000  Previous history: She had initially been treated with metformin and Amaryl and subsequently given Victoza which helped overall control and weight loss. In 9/13 because of significant hyperglycemia she was started on basal bolus insulin also. Had required relatively small doses of insulin. Her blood sugar control has been excellent with A1c between 5.3-6.0 although this may be relatively higher than expected. Tends to have relatively higher readings after supper In 6/15 she was told to resume her Victoza and stop her Lantus since she had been gaining weight with Lantus and NovoLog regimen.   Recent history:     INSULIN: Humalog 10 units twice a day at 12pm-2pm, 7 PM   Non-insulin hypoglycemic drugs: Amaryl 1 mg acs , Victoza 1.2 mg daily    Her A1c is lower than expected for her blood sugar levels but is relatively higher at 6.4, previously 5.6 She is back for her 3 month follow-up  Current blood sugar patterns and management:  She thinks her sugars may have been higher last month because she got steroid injection from her orthopedic doctor  Fasting blood sugars are difficult to assess as she has not done any readings since about 3 weeks ago when her sugars were higher with the steroids  She started readings over 200 in the middle of April and some after meals also  Her only glucose reading was late evening last week which was 132  She has gained weight  She thinks her appetite is fairly good  She takes the same dose of Humalog at her meals even though her diet may be variable and she also did not increase her dose when she was having high sugars with steroids  Not able to exercise because of various issues    Side effects from medications:  none  Monitors blood glucose: Once a day or less on  average .    Glucometer:  Accu-Chek compact.          Blood Glucose readings from monitor download as above: Blood sugars averaging 206, checking infrequently  Hypoglycemia: None    Meals: 1-2 meals per day. Supper at 4 PM pm          Physical activity: exercise: Limited, has had fatigue, back and leg pain.           Dietician visit: Most recent: [id number]           Complications: are: None  Wt Readings from Last 3 Encounters:  02/12/16 266 lb (120.657 kg)  01/02/16 261 lb (118.389 kg)  11/23/15 273 lb (123.832 kg)    LABS:  Lab Results  Component Value Date   HGBA1C 5.6 11/01/2015   HGBA1C 6.7* 06/27/2015   HGBA1C 5.7 03/01/2015   Lab Results  Component Value Date   MICROALBUR <0.7 06/27/2015   LDLCALC 93 03/01/2015   CREATININE 1.95* 11/01/2015    No visits with results within 1 Week(s) from this visit. Latest known visit with results is:  Appointment on 11/01/2015  Component Date Value Ref Range Status  . Hgb A1c MFr Bld 11/01/2015 5.6  4.6 - 6.5 % Final   Glycemic Control Guidelines for People with Diabetes:Non Diabetic:  <6%Goal of Therapy: <7%Additional Action Suggested:  >8%   . Sodium 11/01/2015 135  135 - 145 mEq/L Final  .  Potassium 11/01/2015 4.5  3.5 - 5.1 mEq/L Final  . Chloride 11/01/2015 106  96 - 112 mEq/L Final  . CO2 11/01/2015 22  19 - 32 mEq/L Final  . Glucose, Bld 11/01/2015 204* 70 - 99 mg/dL Final  . BUN 11/01/2015 28* 6 - 23 mg/dL Final  . Creatinine, Ser 11/01/2015 1.95* 0.40 - 1.20 mg/dL Final  . Calcium 11/01/2015 8.8  8.4 - 10.5 mg/dL Final  . GFR 11/01/2015 33.04* >60.00 mL/min Final  . TSH 11/01/2015 0.83  0.35 - 4.50 uIU/mL Final  . Free T4 11/01/2015 0.76  0.60 - 1.60 ng/dL Final      Medication List       This list is accurate as of: 02/12/16  2:41 PM.  Always use your most recent med list.               AMBULATORY NON FORMULARY MEDICATION  Domperidone 10 mg    Take one tablet by mouth twice a day before meals      amitriptyline 10 MG tablet  Commonly known as:  ELAVIL     amLODipine-valsartan 10-320 MG tablet  Commonly known as:  EXFORGE  Take 1 tablet by mouth daily.     aspirin 81 MG EC tablet  Take 81 mg by mouth daily.     cloNIDine 0.1 MG tablet  Commonly known as:  CATAPRES  Take 1 tablet (0.1 mg total) by mouth 2 (two) times daily.     ELMIRON 100 MG capsule  Generic drug:  pentosan polysulfate  Take 100 mg by mouth 2 (two) times daily.     estradiol 2 MG tablet  Commonly known as:  ESTRACE  Take 1 tablet (2 mg total) by mouth daily.     FLUoxetine 20 MG capsule  Commonly known as:  PROZAC  Take 1 capsule (20 mg total) by mouth daily.     gabapentin 600 MG tablet  Commonly known as:  NEURONTIN  Take 600 mg by mouth 2 (two) times daily.     glimepiride 1 MG tablet  Commonly known as:  AMARYL  TAKE 1 TABLET BY MOUTH WITH SUPPER ONCE A DAY PER DR. Dwyane Dee     glucose blood test strip  Commonly known as:  ACCU-CHEK COMPACT PLUS  USE AS INSTRUCTED TO CHECK BLOOD SUGARS ONCE A DAY DX CODE E11.9     isosorbide dinitrate 10 MG tablet  Commonly known as:  ISORDIL  TAKE ONE TABLET BY MOUTH DAILY     levothyroxine 88 MCG tablet  Commonly known as:  SYNTHROID, LEVOTHROID  TAKE 1 TABLET (88 MCG TOTAL) BY MOUTH DAILY.     lidocaine 5 %  Commonly known as:  LIDODERM  Reported on 11/15/2015     linaclotide 145 MCG Caps capsule  Commonly known as:  LINZESS  Take 1 capsule (145 mcg total) by mouth every other day.     metoprolol 50 MG tablet  Commonly known as:  LOPRESSOR  Take one and one-half tablets twice a day     metoprolol succinate 50 MG 24 hr tablet  Commonly known as:  TOPROL-XL     metroNIDAZOLE 500 MG tablet  Commonly known as:  FLAGYL  Take 1 tablet (500 mg total) by mouth 3 (three) times daily.     nitrofurantoin 100 MG capsule  Commonly known as:  MACRODANTIN  Reported on 11/15/2015     NOVOLOG FLEXPEN 100 UNIT/ML FlexPen  Generic drug:  insulin aspart    Inject 10  Units into the skin 3 (three) times daily with meals.     omeprazole 20 MG capsule  Commonly known as:  PRILOSEC  TAKE 1 CAPSULE (20 MG TOTAL) BY MOUTH DAILY.     ondansetron 4 MG tablet  Commonly known as:  ZOFRAN  Take 1 tab every 6 hours as needed for nausea.     oxyCODONE 5 MG immediate release tablet  Commonly known as:  Oxy IR/ROXICODONE  Take 1 tablet (5 mg total) by mouth every 4 (four) hours as needed for severe pain.     simvastatin 20 MG tablet  Commonly known as:  ZOCOR  Take 1 tablet (20 mg total) by mouth daily.     sucralfate 1 GM/10ML suspension  Commonly known as:  CARAFATE  Take 2 teaspoons three times daily before meals.     TRIAMTERENE PO  Take 20 mg by mouth.     trimethoprim 100 MG tablet  Commonly known as:  TRIMPEX  Take 100 mg by mouth daily.     VICTOZA 18 MG/3ML Sopn  Generic drug:  Liraglutide  Inject 0.2 mLs (1.2 mg total) into the skin daily. Inject once daily at the same time        Allergies:  Allergies  Allergen Reactions  . Hydrocodone Other (See Comments)    tachycardia  . Ciprofloxacin Other (See Comments)    dizzy  . Penicillins Hives    Past Medical History  Diagnosis Date  . COMMON MIGRAINE   . DIVERTICULOSIS-COLON   . CORONARY ARTERY DISEASE   . Gastroparesis   . INSOMNIA-SLEEP DISORDER-UNSPEC   . Iron deficiency anemia   . Anxiety   . DEPRESSION   . Diabetes mellitus, type II ([place])   . GERD (gastroesophageal reflux disease)   . Hyperlipidemia   . Hypertension   . Gastric ulcer 04/2008  . CERVICAL RADICULOPATHY, LEFT   . Hiatal hernia   . Hypothyroidism   . Arthritis     Past Surgical History  Procedure Laterality Date  . Left knee replacement    . Rigth knee replacement with revision  04/2008    Dr. Berenice Primas  . Gastric wedge resection lipoma  11/2007    x2 with laparotomy and gastrostomy  . Rotator cuff repair  01/2009  . Cholecystectomy  06/2009  . S/p bladder surgury  09/2009    Dr. Terance Hart   . Abdominal hysterectomy      Family History  Problem Relation Age of Onset  . Diabetes Sister     x 3  . Heart disease Sister     x2  . Diabetes Brother     x3  . Heart disease Brother     x2  . Coronary artery disease Other     female 1st degree  . Hypertension Other   . Colon cancer Neg Hx     Social History:  reports that she has quit smoking. She has never used smokeless tobacco. She reports that she does not drink alcohol or use illicit drugs.  Review of Systems:  Hypertension: Her blood pressure is treated with metoprolol, clonidine and Exforge Not monitoring blood pressure at home but she does have a monitor Blood pressure is high today, she thinks this is from pain She is taking clonidine only once a day instead of twice a day as directed, did take it this morning  She has been given diuretics by her nephrologist  Mild CKD: Her creatinine is  persistently higher and she apparently has  had no specific diagnosis made by the nephrologist Creatinine level from nephrologist was 11.58   Lab Results  Component Value Date   CREATININE 1.95* 11/01/2015   CREATININE 1.85* 08/03/2015   CREATININE 1.94* 07/25/2015   CREATININE 1.93* 06/27/2015     Lab Results  Component Value Date   CREATININE 1.95* 11/01/2015   BUN 28* 11/01/2015   NA 135 11/01/2015   K 4.5 11/01/2015   CL 106 11/01/2015   CO2 22 11/01/2015    Lipids: Usually well controlled with simvastatin   Lab Results  Component Value Date   CHOL 171 03/01/2015   HDL 61.10 03/01/2015   LDLCALC 93 03/01/2015   LDLDIRECT 156.6 01/07/2011   TRIG 86.0 03/01/2015   CHOLHDL 3 03/01/2015     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 Previously she had I-131 treatment on 08/20/11 for Graves' disease   She does feel tired and this Is chronic    Lab Results  Component Value Date   TSH 0.83 11/01/2015   TSH 0.26* 08/03/2015   TSH 0.19* 06/27/2015   FREET4 0.76  11/01/2015   FREET4 0.74 08/03/2015   FREET4 0.80 06/27/2015    She is on ESTRADIOL from gynecologist for heart flashes, now taking 2 mg   Examination:   BP 146/90 mmHg  Pulse 100  Temp(Src) 98.7 F (37.1 C) (Oral)  Ht 5\' 5"  (1.651 m)  Wt 266 lb (120.657 kg)  BMI 44.26 kg/m2  SpO2 96%  Body mass index is 44.26 kg/(m^2).     ASSESSMENT/ PLAN:   Diabetes type 2, uncontrolled :   See history of present illness for detailed discussion of his current management, blood sugar patterns and problems identified Currently on a regimen of Victoza, mealtime insulin and Amaryl Her blood sugars were higher last month but checking very infrequently and not clear if her blood sugars are still high High blood sugars were related most likely to getting a steroid injection A1c is higher than before although again lower than expected for her blood sugar levels She has had weight gain despite claiming to be eating only one meal a day, not clear if this is from periodic use of steroids   Recommendations made:  Monitor blood sugars regularly at various times as discussed and also discussed blood sugar targets  Continue 1.2 mg Victoza  Call if blood sugars are not consistently controlled  Will need to consider increasing Amaryl if fasting readings are high  Will look at her blood sugar patterns and decide on insulin doses on her next visit  Discussed adjusting her medications and insulin if she has a steroid injection again  Balanced meals and low carbohydrate snacks  Hypothyroidism: TSH will be rechecked today  HYPERTENSION: She needs to take clonidine twice a day as prescribed instead of once a day  Patient Instructions  Check blood sugars on waking up 2-3  times a week Also check blood sugars about 2 hours after a meal and do this after different meals by rotation  Recommended blood sugar levels on waking up is 90-130 and about 2 hours after meal is 130-160  Please bring your  blood sugar monitor to each visit, thank you  Clonidine twice daily    Counseling time on subjects discussed above is over 50% of today's 25 minute visit   [name] 02/12/2016, 2:41 PM   Note: This office note was prepared with Dragon voice recognition system technology. Any transcriptional errors that result from  this process are unintentional.

## 2016-02-13 DIAGNOSIS — Z0000 Encounter for general adult medical examination without abnormal findings: Secondary | ICD-10-CM | POA: Diagnosis not present

## 2016-02-13 DIAGNOSIS — N3010 Interstitial cystitis (chronic) without hematuria: Secondary | ICD-10-CM | POA: Diagnosis not present

## 2016-02-14 NOTE — Progress Notes (Signed)
Quick Note:  Please let patient know thyroid level was slightly low, needs to take extra half pill weekly of the Synthroid 88 g Also glucose was 235, need to check sugars more regularly and let us know if blood sugars stay consistently high ______

## 2016-02-16 ENCOUNTER — Encounter: Payer: Self-pay | Admitting: *Deleted

## 2016-02-16 DIAGNOSIS — M5416 Radiculopathy, lumbar region: Secondary | ICD-10-CM | POA: Diagnosis not present

## 2016-02-22 ENCOUNTER — Telehealth: Payer: Self-pay | Admitting: Internal Medicine

## 2016-02-22 NOTE — Telephone Encounter (Signed)
Patient states a surgery clearance form was sent over.  She would like to know the status on this form.  Please follow up with patient in regards.

## 2016-02-23 NOTE — Telephone Encounter (Signed)
Called and spoke to patient, she is aware that we have not received the papers and she is supposed to be calling the office to get it sent.

## 2016-02-27 ENCOUNTER — Other Ambulatory Visit: Payer: Self-pay | Admitting: *Deleted

## 2016-02-27 MED ORDER — AMLODIPINE BESYLATE-VALSARTAN 10-320 MG PO TABS: 1.0000 | ORAL_TABLET | Freq: Every day | ORAL | Status: DC

## 2016-03-07 ENCOUNTER — Encounter: Payer: Self-pay | Admitting: Internal Medicine

## 2016-03-07 HISTORY — PX: BACK SURGERY: SHX140

## 2016-03-18 DIAGNOSIS — M5416 Radiculopathy, lumbar region: Secondary | ICD-10-CM | POA: Diagnosis not present

## 2016-03-20 ENCOUNTER — Ambulatory Visit: Payer: Medicare Other | Admitting: Internal Medicine

## 2016-03-20 DIAGNOSIS — Z0289 Encounter for other administrative examinations: Secondary | ICD-10-CM

## 2016-03-21 ENCOUNTER — Other Ambulatory Visit: Payer: Self-pay | Admitting: Orthopedic Surgery

## 2016-03-28 ENCOUNTER — Encounter (HOSPITAL_COMMUNITY)
Admission: RE | Admit: 2016-03-28 | Discharge: 2016-03-28 | Disposition: A | Discharge status: Home / Self Care | Payer: Medicare Other | Source: Ambulatory Visit | Attending: Orthopedic Surgery | Admitting: Orthopedic Surgery

## 2016-03-28 ENCOUNTER — Encounter (HOSPITAL_COMMUNITY): Payer: Self-pay

## 2016-03-28 DIAGNOSIS — M79604 Pain in right leg: Secondary | ICD-10-CM | POA: Diagnosis not present

## 2016-03-28 DIAGNOSIS — F419 Anxiety disorder, unspecified: Secondary | ICD-10-CM | POA: Insufficient documentation

## 2016-03-28 DIAGNOSIS — Z01818 Encounter for other preprocedural examination: Secondary | ICD-10-CM | POA: Diagnosis not present

## 2016-03-28 DIAGNOSIS — K3184 Gastroparesis: Secondary | ICD-10-CM | POA: Diagnosis not present

## 2016-03-28 DIAGNOSIS — Z79899 Other long term (current) drug therapy: Secondary | ICD-10-CM | POA: Insufficient documentation

## 2016-03-28 DIAGNOSIS — Z96653 Presence of artificial knee joint, bilateral: Secondary | ICD-10-CM | POA: Diagnosis not present

## 2016-03-28 DIAGNOSIS — E1143 Type 2 diabetes mellitus with diabetic autonomic (poly)neuropathy: Secondary | ICD-10-CM | POA: Diagnosis not present

## 2016-03-28 DIAGNOSIS — E039 Hypothyroidism, unspecified: Secondary | ICD-10-CM | POA: Diagnosis not present

## 2016-03-28 DIAGNOSIS — Z794 Long term (current) use of insulin: Secondary | ICD-10-CM | POA: Insufficient documentation

## 2016-03-28 DIAGNOSIS — I129 Hypertensive chronic kidney disease with stage 1 through stage 4 chronic kidney disease, or unspecified chronic kidney disease: Secondary | ICD-10-CM | POA: Insufficient documentation

## 2016-03-28 DIAGNOSIS — Z0183 Encounter for blood typing: Secondary | ICD-10-CM | POA: Diagnosis not present

## 2016-03-28 DIAGNOSIS — N183 Chronic kidney disease, stage 3 (moderate): Secondary | ICD-10-CM | POA: Diagnosis not present

## 2016-03-28 DIAGNOSIS — E1122 Type 2 diabetes mellitus with diabetic chronic kidney disease: Secondary | ICD-10-CM | POA: Insufficient documentation

## 2016-03-28 DIAGNOSIS — I2510 Atherosclerotic heart disease of native coronary artery without angina pectoris: Secondary | ICD-10-CM | POA: Insufficient documentation

## 2016-03-28 DIAGNOSIS — Z87891 Personal history of nicotine dependence: Secondary | ICD-10-CM | POA: Insufficient documentation

## 2016-03-28 DIAGNOSIS — F329 Major depressive disorder, single episode, unspecified: Secondary | ICD-10-CM | POA: Diagnosis not present

## 2016-03-28 DIAGNOSIS — Z7982 Long term (current) use of aspirin: Secondary | ICD-10-CM | POA: Diagnosis not present

## 2016-03-28 DIAGNOSIS — Z01812 Encounter for preprocedural laboratory examination: Secondary | ICD-10-CM | POA: Insufficient documentation

## 2016-03-28 HISTORY — DX: Chronic kidney disease, unspecified: N18.9

## 2016-03-28 HISTORY — DX: Presence of spectacles and contact lenses: Z97.3

## 2016-03-28 LAB — SURGICAL PCR SCREEN
MRSA, PCR: NEGATIVE
Staphylococcus aureus: POSITIVE — AB

## 2016-03-28 LAB — COMPREHENSIVE METABOLIC PANEL
ALT: 11 U/L — ABNORMAL LOW (ref 14–54)
AST: 14 U/L — ABNORMAL LOW (ref 15–41)
Albumin: 3.4 g/dL — ABNORMAL LOW (ref 3.5–5.0)
Alkaline Phosphatase: 65 U/L (ref 38–126)
Anion gap: 6 (ref 5–15)
BUN: 26 mg/dL — ABNORMAL HIGH (ref 6–20)
CO2: 22 mmol/L (ref 22–32)
Calcium: 9.2 mg/dL (ref 8.9–10.3)
Chloride: 107 mmol/L (ref 101–111)
Creatinine, Ser: 1.93 mg/dL — ABNORMAL HIGH (ref 0.44–1.00)
GFR calc Af Amer: 30 mL/min — ABNORMAL LOW (ref >60)
GFR calc non Af Amer: 26 mL/min — ABNORMAL LOW (ref >60)
Glucose, Bld: 116 mg/dL — ABNORMAL HIGH (ref 65–99)
Potassium: 4.8 mmol/L (ref 3.5–5.1)
Sodium: 135 mmol/L (ref 135–145)
Total Bilirubin: 0.4 mg/dL (ref 0.3–1.2)
Total Protein: 7 g/dL (ref 6.5–8.1)

## 2016-03-28 LAB — CBC WITH DIFFERENTIAL/PLATELET
Basophils Absolute: 0 10*3/uL (ref 0.0–0.1)
Basophils Relative: 0 %
Eosinophils Absolute: 0.2 10*3/uL (ref 0.0–0.7)
Eosinophils Relative: 3 %
HCT: 32.1 % — ABNORMAL LOW (ref 36.0–46.0)
Hemoglobin: 9.9 g/dL — ABNORMAL LOW (ref 12.0–15.0)
Lymphocytes Relative: 28 %
Lymphs Abs: 2.2 10*3/uL (ref 0.7–4.0)
MCH: 30.1 pg (ref 26.0–34.0)
MCHC: 30.8 g/dL (ref 30.0–36.0)
MCV: 97.6 fL (ref 78.0–100.0)
Monocytes Absolute: 0.3 10*3/uL (ref 0.1–1.0)
Monocytes Relative: 4 %
Neutro Abs: 5.2 10*3/uL (ref 1.7–7.7)
Neutrophils Relative %: 65 %
Platelets: 240 10*3/uL (ref 150–400)
RBC: 3.29 MIL/uL — ABNORMAL LOW (ref 3.87–5.11)
RDW: 12.6 % (ref 11.5–15.5)
WBC: 7.9 10*3/uL (ref 4.0–10.5)

## 2016-03-28 LAB — PROTIME-INR
INR: 1.24 (ref 0.00–1.49)
Prothrombin Time: 15.7 seconds — ABNORMAL HIGH (ref 11.6–15.2)

## 2016-03-28 LAB — URINALYSIS, ROUTINE W REFLEX MICROSCOPIC
Bilirubin Urine: NEGATIVE
Glucose, UA: NEGATIVE mg/dL
Hgb urine dipstick: NEGATIVE
Ketones, ur: NEGATIVE mg/dL
Leukocytes, UA: NEGATIVE
Nitrite: NEGATIVE
Protein, ur: NEGATIVE mg/dL
Specific Gravity, Urine: 1.017 (ref 1.005–1.030)
pH: 6 (ref 5.0–8.0)

## 2016-03-28 LAB — APTT: aPTT: 31 seconds (ref 24–37)

## 2016-03-28 LAB — GLUCOSE, CAPILLARY: Glucose-Capillary: 140 mg/dL — ABNORMAL HIGH (ref 65–99)

## 2016-03-28 NOTE — Progress Notes (Signed)
PCP - Dr. Cathlean Cower Cardiologist - Dr. Terrence Dupont  EKG- requested from Dr. Terrence Dupont CXR - 03/28/16  Echo/stress test/cardiac cath - denies  Patient denies chest pain and shortness of breath at PAT appointment.  Patient states that she checks her blood sugar in the mornings and at night and that her fasting glucose is in the 140's.  Her last A1C was 02/12/16 and was 6.4.

## 2016-03-28 NOTE — Pre-Procedure Instructions (Signed)
[name]  03/28/2016      [facility] [id number] [place], [place] - [place] [address] Phone: [phone] Fax: [phone]  [facility] [address] [place], [place] - [address] [place] [address] Phone: [phone] Fax: [phone]  [facility] [place], [place] [address] Phone: [phone] Fax: [phone]  [facility] - [place], [place] [address] Phone: [phone] Fax: [phone]    Your procedure is scheduled on Wednesday, June 28th, 2017.  Report to [facility] Admitting at 9:30 A.M.   Call this number if you have problems the morning of surgery:  [phone]   Remember:  Do not eat food or drink liquids after midnight.   Take these medicines the morning of surgery with A SIP OF WATER: Acetaminophen-Codeine (Tylenol #3), Clonidine (Catapres), Fluoxetine (Prozac), Gabapentin (Neurontin), Isosorbide Dinitrate (Isordil), Levothyroxine (Synthroid), Metoprolol (Lopressor), Omeprazole (Prilosec), Ondansetron (Zofran) if needed, Oxycodone (OxyIR) if needed, Trimethoprim (Trimpex).    Stop taking Elmiron on Sunday, June 25th.  7 days prior to surgery, stop taking: Aspirin, NSAIDS, Aleve, Naproxen, Ibuprofen, Advil, Motrin, BC's, Goody's, Fish oil, all herbal medications, and all vitamins.    WHAT DO I DO ABOUT MY DIABETES MEDICATION?  Marland Kitchen The night before surgery, do NOT take Glimepiride (Amaryl).   . THE MORNING OF SURGERY, take 0 units of Novolog insulin. THE MORNING OF SURGERY, do NOT take Victoza.  . The day of surgery, do not take other diabetes injectables, including Byetta (exenatide), Bydureon (exenatide ER), Victoza (liraglutide), or Trulicity  (dulaglutide).  . If your CBG is greater than 220 mg/dL on the day of surgery, you may take  of your sliding scale (correction) dose of insulin.    Do not wear jewelry, make-up or nail polish.  Do not wear lotions, powders, or perfumes.  You may NOT wear deoderant.  Do not shave 48 hours prior to surgery.   Do not bring valuables to the hospital.  [facility] is not responsible for any belongings or valuables.  Contacts, dentures or bridgework may not be worn into surgery.  Leave your suitcase in the car.  After surgery it may be brought to your room.  For patients admitted to the hospital, discharge time will be determined by your treatment team.  Patients discharged the day of surgery will not be allowed to drive home.   Special instructions:  Preparing for Surgery.   Please read over the following fact sheets that you were given. MRSA Information    How to Manage Your Diabetes Before and After Surgery  Why is it important to control my blood sugar before and after surgery? . Improving blood sugar levels before and after surgery helps healing and can limit problems. . A way of improving blood sugar control is eating a healthy diet by: o  Eating less sugar and carbohydrates o  Increasing activity/exercise o  Talking with your doctor about reaching your blood sugar goals . High blood sugars (greater than 180 mg/dL) can raise your risk of infections and slow your recovery, so you will need to focus on controlling your diabetes during the weeks before surgery. . Make sure that the doctor who takes care of your diabetes knows about your planned surgery including the date  and location.  How do I manage my blood sugar before surgery? . Check your blood sugar at least 4 times a day, starting 2 days before surgery, to make sure that the level is not too high or low. o Check your blood sugar the morning of your surgery when you wake up and every 2 hours until you get to the Short Stay  unit. . If your blood sugar is less than 70 mg/dL, you will need to treat for low blood sugar: o Do not take insulin. o Treat a low blood sugar (less than 70 mg/dL) with  cup of clear juice (cranberry or apple), 4 glucose tablets, OR glucose gel. o Recheck blood sugar in 15 minutes after treatment (to make sure it is greater than 70 mg/dL). If your blood sugar is not greater than 70 mg/dL on recheck, call [phone] for further instructions. . Report your blood sugar to the short stay nurse when you get to Short Stay.  . If you are admitted to the hospital after surgery: o Your blood sugar will be checked by the staff and you will probably be given insulin after surgery (instead of oral diabetes medicines) to make sure you have good blood sugar levels. o The goal for blood sugar control after surgery is 80-180 mg/dL.     [place]- Preparing For Surgery  Before surgery, you can play an important role. Because skin is not sterile, your skin needs to be as free of germs as possible. You can reduce the number of germs on your skin by washing with CHG (chlorahexidine gluconate) Soap before surgery.  CHG is an antiseptic cleaner which kills germs and bonds with the skin to continue killing germs even after washing.  Please do not use if you have an allergy to CHG or antibacterial soaps. If your skin becomes reddened/irritated stop using the CHG.  Do not shave (including legs and underarms) for at least 48 hours prior to first CHG shower. It is OK to shave your face.  Please follow these instructions carefully.   1. Shower the NIGHT BEFORE SURGERY and the MORNING OF SURGERY with CHG.   2. If you chose to wash your hair, wash your hair first as usual with your normal shampoo.  3. After you shampoo, rinse your hair and body thoroughly to remove the shampoo.  4. Use CHG as you would any other liquid soap. You can apply CHG directly to the skin and wash gently with a scrungie or a clean  washcloth.   5. Apply the CHG Soap to your body ONLY FROM THE NECK DOWN.  Do not use on open wounds or open sores. Avoid contact with your eyes, ears, mouth and genitals (private parts). Wash genitals (private parts) with your normal soap.  6. Wash thoroughly, paying special attention to the area where your surgery will be performed.  7. Thoroughly rinse your body with warm water from the neck down.  8. DO NOT shower/wash with your normal soap after using and rinsing off the CHG Soap.  9. Pat yourself dry with a CLEAN TOWEL.   10. Wear CLEAN PAJAMAS   11. Place CLEAN SHEETS on your bed the night of your first shower and DO NOT SLEEP WITH PETS.  Day of Surgery: Do not apply any deodorants/lotions. Please wear clean clothes to the hospital/surgery center.

## 2016-03-28 NOTE — Progress Notes (Signed)
Mupirocin Ointment Rx called into [facility] on [address] for positive PCR of Staph. Left message on pt's voicemail with results and instructions to pick up Rx.

## 2016-03-29 ENCOUNTER — Encounter (HOSPITAL_COMMUNITY): Payer: Self-pay

## 2016-03-29 NOTE — Progress Notes (Addendum)
Anesthesia Chart Review: Patient is a 65 year old female scheduled for right sided L4-5 transforaminal lumbar interbody fusion on 04/03/16 by Dr. Lynann Bologna.  History includes former smoker, migraines, diverticulosis, CAD, HTN, HLD, DM2, gastroparesis, CKD stage III, hiatal hernia, hypothyroidism, insomnia, iron deficiency anemia, anxiety, depression, hysterectomy, cholecystectomy, bilateral TKA. BMI is consistent with morbid obesity.    PCP is Dr. Cathlean Cower, last visit 01/02/16. He signed a note of medical clearance on 03/07/16. Nephrologist is Dr. Izell [place], last visit 01/22/16. Last Cr there have been 1.43-1.58 since 01/03/16. Endocrinologist is Dr. Elayne Snare, last visit 02/12/16. Cardiologist is Dr. Terrence Dupont, last visit 02/12/16.  Meds include Domperidone, Elavil, Exforge, ASA 81mg , clonidine, Elmiron, Estrace, Prozac, Neurontin, Amaryl, Novolog, Isordil, levothyroxine, Linzess, Lopressor, Prilosec, Zofran, Oxy IR, Zocor, Carafate, Trimpex, Victoza.  An EKG was requested from Dr. Terrence Dupont, but not received. I called and was told that she has not had one there in the past year. Her last EKG here was in 2014.  10/24/09 Nuclear stress test: IMPRESSION: 1. Negative for pharmacologic-stress induced ischemia. 2. Left ventricular ejection fraction 61%.  02/24/07 Cardiac cath (Dr. Terrence Dupont): FINDINGS: LV showed good LV systolic function, LVH EF of 60-65%. Left main was long which was patent. LAD has 10-15% mid stenosis. Diagonal one is less than 1.5 mm, has 60-70% sequential mid stenosis. Diagonal 2 is very very small which is patent. Ramus is patent proximally and then bifurcates into superior and inferior branch. Superior branch is small which has 70-80% focal stenosis. Inferior branch is a larger branch which is patent. Left circumflex is patent. OM1 is very small which is patent. OM2 is small which is patent. RCA is patent.  Plan is to maximize antianginal medications. If she  continues to have recurrent chest pain, then we will consider PCI to ramus branch.  03/28/16 CXR: IMPRESSION: No active lung disease. Heart upper normal.  Preoperative labs noted. Cr 1.93 (up from 1.30 on 02/12/16; range of 1.30-1.95 since 03/24/15), BUN 26. H/H 9.9/32.1 (down from 11.5/35.0 on 07/25/15). PT 15.7, INR 1.24. PTT 31. Glucose 116. A1c 6.4 on 02/12/16. T&S done. UA WNL.   I reviewed above with anesthesiologist Dr. Deatra Canter. Patient's Cr has been variable (1.3-1.9 range, and more often in the 1.8-1.9 range since 06/2015; she is followed by nephrology). Will plan to get a STAT BMET on arrival to ensure no significant changes. I forwarded CBC and Cr results to Dr. Jenny Reichmann who wants to see her after surgery to follow-up anemia. She does have known iron deficiency anemia, but H/H lower from six months ago. I don't see that she is on iron currently. I'll change T&S to T&C for 2 Units since this is a lumbar fusion. Also with known CAD and last nuclear stress test > 5 years ago, would recommend cardiology pre-operative input. She was recently seen by Dr. Terrence Dupont, so we would defer decision for additional testing to him. If EKG not done prior to surgery, then one will need to be done on arrival.  I have upated Carla at Dr. Lynann Bologna and notified her of of need for cardiology preoperative input and also that Dr. Jenny Reichmann wants patient instructed to schedule follow-up with him after surgery.   George Hugh [facility] Short Stay Center/Anesthesiology Phone [phone] 03/29/2016 1:03 PM  Addendum: Patient was seen by Dr. Terrence Dupont on 04/01/16. He wrote, "Patient is acceptable risk for above surgery from cardiac point of view."  By notes, 04/01/16 EKG: NSR, no ST elevations or depressions, no  Q-waves. (I called and requested office fax tracing and was told it would be faxed later today.)  If BMET results stable and no acute changes then I would anticipate that she could proceed as planned.   George Hugh [facility] Short Stay Center/Anesthesiology Phone [phone] 04/02/2016 11:13 AM

## 2016-04-01 DIAGNOSIS — M1990 Unspecified osteoarthritis, unspecified site: Secondary | ICD-10-CM | POA: Diagnosis not present

## 2016-04-01 DIAGNOSIS — E119 Type 2 diabetes mellitus without complications: Secondary | ICD-10-CM | POA: Diagnosis not present

## 2016-04-01 DIAGNOSIS — E669 Obesity, unspecified: Secondary | ICD-10-CM | POA: Diagnosis not present

## 2016-04-01 DIAGNOSIS — I129 Hypertensive chronic kidney disease with stage 1 through stage 4 chronic kidney disease, or unspecified chronic kidney disease: Secondary | ICD-10-CM | POA: Diagnosis not present

## 2016-04-01 DIAGNOSIS — E785 Hyperlipidemia, unspecified: Secondary | ICD-10-CM | POA: Diagnosis not present

## 2016-04-01 DIAGNOSIS — I2510 Atherosclerotic heart disease of native coronary artery without angina pectoris: Secondary | ICD-10-CM | POA: Diagnosis not present

## 2016-04-01 DIAGNOSIS — N189 Chronic kidney disease, unspecified: Secondary | ICD-10-CM | POA: Diagnosis not present

## 2016-04-01 DIAGNOSIS — E039 Hypothyroidism, unspecified: Secondary | ICD-10-CM | POA: Diagnosis not present

## 2016-04-01 DIAGNOSIS — Z01810 Encounter for preprocedural cardiovascular examination: Secondary | ICD-10-CM | POA: Diagnosis not present

## 2016-04-01 NOTE — H&P (Signed)
PREOPERATIVE H&P  Chief Complaint: R leg pain  HPI: [name] is a 65 y.o. 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 COMMON MIGRAINE   . DIVERTICULOSIS-COLON   . CORONARY ARTERY DISEASE   . Gastroparesis   . INSOMNIA-SLEEP DISORDER-UNSPEC   . Iron deficiency anemia   . Anxiety   . DEPRESSION   . Diabetes mellitus, type II ([place])   . GERD (gastroesophageal reflux disease)   . Hyperlipidemia   . Hypertension   . Gastric ulcer 04/2008  . CERVICAL RADICULOPATHY, LEFT   . Hiatal hernia   . Hypothyroidism   . Arthritis   . Wears glasses   . CKD (chronic kidney disease)    Past Surgical History  Procedure Laterality Date  . Left knee replacement    . Rigth knee replacement with revision  04/2008    Dr. Berenice Primas  . Gastric wedge resection lipoma  11/2007    x2 with laparotomy and gastrostomy  . Rotator cuff repair Left 01/2009  . Cholecystectomy  06/2009  . S/p bladder surgury  09/2009    Dr. Terance Hart  . Abdominal hysterectomy    . Eye surgery Bilateral     lasik  . Colonoscopy    . Esophagogastroduodenoscopy     Social History   Social History  . Marital Status: Married    Spouse Name: N/A  . Number of Children: N/A  . Years of Education: N/A   Occupational History  . disabled    Social History Main Topics  . Smoking status: Former Research scientist (life sciences)  . Smokeless tobacco: Never Used     Comment: quit 30 years ago  . Alcohol Use: No  . Drug Use: No  . Sexual Activity: Not on file   Other Topics Concern  . Not on file   Social History Narrative   Patient gets regular exercise   Family History  Problem Relation Age of Onset  . Diabetes Sister     x 3  . Heart disease Sister    x2  . Diabetes Brother     x3  . Heart disease Brother     x2  . Coronary artery disease Other     female 1st degree  . Hypertension Other   . Colon cancer Neg Hx    Allergies  Allergen Reactions  . Hydrocodone Other (See Comments)    tachycardia  . Ciprofloxacin Other (See Comments)    dizzy  . Penicillins Hives    Has patient had a PCN reaction causing immediate rash, facial/tongue/throat swelling, SOB or lightheadedness with hypotension: Yes Has patient had a PCN reaction causing severe rash involving mucus membranes or skin necrosis: No Has patient had a PCN reaction that required hospitalization No Has patient had a PCN reaction occurring within the last 10 years: Yes If all of the above answers are "NO", then may proceed with Cephalosporin use.    Prior to Admission medications   Medication Sig Start Date End Date Taking? Authorizing Provider  acetaminophen-codeine (TYLENOL #3) 300-30 MG tablet Take 1 tablet by mouth every 4 (four) hours as needed for moderate pain.  02/07/16  Yes Historical Provider, MD  AMBULATORY NON FORMULARY MEDICATION Domperidone 10  mg    Take one tablet by mouth twice a day before meals 08/31/12  Yes Ladene Artist, MD  amitriptyline (ELAVIL) 10 MG tablet Take 10 mg by mouth at bedtime.  07/26/15  Yes Historical Provider, MD  amLODipine-valsartan (EXFORGE) 10-320 MG tablet Take 1 tablet by mouth daily. 02/27/16  Yes Biagio Borg, MD  aspirin 81 MG EC tablet Take 81 mg by mouth daily.     Yes Historical Provider, MD  cloNIDine (CATAPRES) 0.1 MG tablet Take 1 tablet (0.1 mg total) by mouth 2 (two) times daily. 11/28/15  Yes Elayne Snare, MD  ELMIRON 100 MG capsule Take 100 mg by mouth 2 (two) times daily.  07/20/13  Yes Historical Provider, MD  estradiol (ESTRACE) 2 MG tablet Take 1 tablet (2 mg total) by mouth daily. 07/17/15  Yes Terrance Mass, MD  FLUoxetine (PROZAC) 20 MG capsule Take 1 capsule (20 mg total) by mouth daily. 09/04/15  Yes Biagio Borg, MD    gabapentin (NEURONTIN) 600 MG tablet Take 600-1,200 mg by mouth 2 (two) times daily.  08/09/14  Yes Historical Provider, MD  glimepiride (AMARYL) 1 MG tablet TAKE 1 TABLET BY MOUTH WITH SUPPER ONCE A DAY PER DR. Dwyane Dee 09/06/15  Yes Elayne Snare, MD  glucose blood (ACCU-CHEK COMPACT PLUS) test strip USE AS INSTRUCTED TO CHECK BLOOD SUGARS ONCE A DAY DX CODE E11.9 05/25/15  Yes Elayne Snare, MD  insulin aspart (NOVOLOG FLEXPEN) 100 UNIT/ML FlexPen Inject 10 Units into the skin 3 (three) times daily with meals.   Yes Historical Provider, MD  isosorbide dinitrate (ISORDIL) 10 MG tablet TAKE ONE TABLET BY MOUTH DAILY 08/01/14  Yes Biagio Borg, MD  levothyroxine (SYNTHROID, LEVOTHROID) 88 MCG tablet TAKE 1 TABLET (88 MCG TOTAL) BY MOUTH DAILY. 02/13/16  Yes Elayne Snare, MD  Linaclotide (LINZESS) 145 MCG CAPS capsule Take 1 capsule (145 mcg total) by mouth every other day. 10/19/14  Yes Ladene Artist, MD  metoprolol (LOPRESSOR) 50 MG tablet Take one and one-half tablets twice a day Patient taking differently: Take 75 mg by mouth 2 (two) times daily.  05/31/13  Yes Biagio Borg, MD  omeprazole (PRILOSEC) 20 MG capsule TAKE 1 CAPSULE (20 MG TOTAL) BY MOUTH DAILY. 12/13/15  Yes Ladene Artist, MD  ondansetron (ZOFRAN) 4 MG tablet Take 1 tab every 6 hours as needed for nausea. Patient taking differently: Take 4 mg by mouth every 6 (six) hours as needed for nausea or vomiting.  07/25/15  Yes Biagio Borg, MD  oxyCODONE (OXY IR/ROXICODONE) 5 MG immediate release tablet Take 1 tablet (5 mg total) by mouth every 4 (four) hours as needed for severe pain. 11/23/15  Yes Biagio Borg, MD  simvastatin (ZOCOR) 20 MG tablet Take 1 tablet (20 mg total) by mouth daily. 09/04/15  Yes Biagio Borg, MD  sucralfate (CARAFATE) 1 GM/10ML suspension Take 2 teaspoons three times daily before meals. Patient taking differently: Take 1 g by mouth. Take 2 teaspoons three times daily before meals. 10/19/14  Yes Ladene Artist, MD  trimethoprim  (TRIMPEX) 100 MG tablet Take 100 mg by mouth daily.  08/08/14  Yes Historical Provider, MD  VICTOZA 18 MG/3ML SOPN Inject 0.2 mLs (1.2 mg total) into the skin daily. Inject once daily at the same time 07/10/15  Yes Elayne Snare, MD     All other systems have been reviewed and were otherwise negative with the exception of those mentioned in the HPI and  as above.  Physical Exam: There were no vitals filed for this visit.  General: Alert, no acute distress Cardiovascular: No pedal edema Respiratory: No cyanosis, no use of accessory musculature Skin: No lesions in the area of chief complaint Neurologic: Sensation intact distally Psychiatric: Patient is competent for consent with normal mood and affect Lymphatic: No axillary or cervical lymphadenopathy  MUSCULOSKELETAL: + SLR on the right  Assessment/Plan:  Right leg pain  Plan for Procedure(s):  RIGHT SIDED LUMBAR 4-5 TRANSFORAMINAL INTERBODY FUSION WITH INSTRUMENTATION AND ALLOGRAFT   Sinclair Ship, MD 04/01/2016 8:32 AM

## 2016-04-02 MED ORDER — CLINDAMYCIN PHOSPHATE 900 MG/50ML IV SOLN: 900.0000 mg | INTRAVENOUS | Status: DC

## 2016-04-03 ENCOUNTER — Inpatient Hospital Stay (HOSPITAL_COMMUNITY): Payer: Medicare Other

## 2016-04-03 ENCOUNTER — Encounter (HOSPITAL_COMMUNITY)
Admission: RE | Disposition: A | Discharge status: Home Health | Payer: Self-pay | Source: Ambulatory Visit | Attending: Orthopedic Surgery

## 2016-04-03 ENCOUNTER — Inpatient Hospital Stay (HOSPITAL_COMMUNITY): Payer: Medicare Other | Admitting: Vascular Surgery

## 2016-04-03 ENCOUNTER — Encounter (HOSPITAL_COMMUNITY): Payer: Self-pay | Admitting: *Deleted

## 2016-04-03 ENCOUNTER — Inpatient Hospital Stay (HOSPITAL_COMMUNITY)
Admission: RE | Admit: 2016-04-03 | Discharge: 2016-04-05 | DRG: 460 | Disposition: A | Discharge status: Home Health | Payer: Medicare Other | Source: Ambulatory Visit | Attending: Orthopedic Surgery | Admitting: Orthopedic Surgery

## 2016-04-03 DIAGNOSIS — Z794 Long term (current) use of insulin: Secondary | ICD-10-CM

## 2016-04-03 DIAGNOSIS — E1122 Type 2 diabetes mellitus with diabetic chronic kidney disease: Secondary | ICD-10-CM | POA: Diagnosis present

## 2016-04-03 DIAGNOSIS — M4310 Spondylolisthesis, site unspecified: Secondary | ICD-10-CM | POA: Diagnosis present

## 2016-04-03 DIAGNOSIS — M4316 Spondylolisthesis, lumbar region: Secondary | ICD-10-CM | POA: Diagnosis not present

## 2016-04-03 DIAGNOSIS — Z87891 Personal history of nicotine dependence: Secondary | ICD-10-CM | POA: Diagnosis not present

## 2016-04-03 DIAGNOSIS — E785 Hyperlipidemia, unspecified: Secondary | ICD-10-CM | POA: Diagnosis present

## 2016-04-03 DIAGNOSIS — N189 Chronic kidney disease, unspecified: Secondary | ICD-10-CM | POA: Diagnosis present

## 2016-04-03 DIAGNOSIS — Z96653 Presence of artificial knee joint, bilateral: Secondary | ICD-10-CM | POA: Diagnosis present

## 2016-04-03 DIAGNOSIS — Z6841 Body Mass Index (BMI) 40.0 and over, adult: Secondary | ICD-10-CM

## 2016-04-03 DIAGNOSIS — E1143 Type 2 diabetes mellitus with diabetic autonomic (poly)neuropathy: Secondary | ICD-10-CM | POA: Diagnosis present

## 2016-04-03 DIAGNOSIS — I2510 Atherosclerotic heart disease of native coronary artery without angina pectoris: Secondary | ICD-10-CM | POA: Diagnosis present

## 2016-04-03 DIAGNOSIS — M4806 Spinal stenosis, lumbar region: Secondary | ICD-10-CM | POA: Diagnosis present

## 2016-04-03 DIAGNOSIS — Z419 Encounter for procedure for purposes other than remedying health state, unspecified: Secondary | ICD-10-CM

## 2016-04-03 DIAGNOSIS — E039 Hypothyroidism, unspecified: Secondary | ICD-10-CM | POA: Diagnosis present

## 2016-04-03 DIAGNOSIS — M1990 Unspecified osteoarthritis, unspecified site: Secondary | ICD-10-CM | POA: Diagnosis not present

## 2016-04-03 DIAGNOSIS — I129 Hypertensive chronic kidney disease with stage 1 through stage 4 chronic kidney disease, or unspecified chronic kidney disease: Secondary | ICD-10-CM | POA: Diagnosis present

## 2016-04-03 DIAGNOSIS — Z7982 Long term (current) use of aspirin: Secondary | ICD-10-CM | POA: Diagnosis not present

## 2016-04-03 DIAGNOSIS — Z79899 Other long term (current) drug therapy: Secondary | ICD-10-CM | POA: Diagnosis not present

## 2016-04-03 DIAGNOSIS — M5416 Radiculopathy, lumbar region: Secondary | ICD-10-CM | POA: Diagnosis present

## 2016-04-03 DIAGNOSIS — Z90710 Acquired absence of both cervix and uterus: Secondary | ICD-10-CM

## 2016-04-03 DIAGNOSIS — M5410 Radiculopathy, site unspecified: Secondary | ICD-10-CM | POA: Diagnosis present

## 2016-04-03 DIAGNOSIS — K219 Gastro-esophageal reflux disease without esophagitis: Secondary | ICD-10-CM | POA: Diagnosis present

## 2016-04-03 DIAGNOSIS — M79604 Pain in right leg: Secondary | ICD-10-CM | POA: Diagnosis not present

## 2016-04-03 DIAGNOSIS — Z981 Arthrodesis status: Secondary | ICD-10-CM | POA: Diagnosis not present

## 2016-04-03 LAB — GLUCOSE, CAPILLARY
Glucose-Capillary: 193 mg/dL — ABNORMAL HIGH (ref 65–99)
Glucose-Capillary: 150 mg/dL — ABNORMAL HIGH (ref 65–99)
Glucose-Capillary: 176 mg/dL — ABNORMAL HIGH (ref 65–99)
Glucose-Capillary: 221 mg/dL — ABNORMAL HIGH (ref 65–99)

## 2016-04-03 LAB — BASIC METABOLIC PANEL
Anion gap: 9 (ref 5–15)
BUN: 20 mg/dL (ref 6–20)
CO2: 19 mmol/L — ABNORMAL LOW (ref 22–32)
Calcium: 9.6 mg/dL (ref 8.9–10.3)
Chloride: 109 mmol/L (ref 101–111)
Creatinine, Ser: 1.49 mg/dL — ABNORMAL HIGH (ref 0.44–1.00)
GFR calc Af Amer: 41 mL/min — ABNORMAL LOW (ref >60)
GFR calc non Af Amer: 35 mL/min — ABNORMAL LOW (ref >60)
Glucose, Bld: 175 mg/dL — ABNORMAL HIGH (ref 65–99)
Potassium: 4.6 mmol/L (ref 3.5–5.1)
Sodium: 137 mmol/L (ref 135–145)

## 2016-04-03 LAB — PREPARE RBC (CROSSMATCH)

## 2016-04-03 SURGERY — POSTERIOR LUMBAR FUSION 1 LEVEL
Anesthesia: General | Laterality: Right

## 2016-04-03 MED ORDER — SENNOSIDES-DOCUSATE SODIUM 8.6-50 MG PO TABS: 1.0000 | ORAL_TABLET | Freq: Every evening | ORAL | Status: DC | PRN

## 2016-04-03 MED ORDER — PROPOFOL 10 MG/ML IV BOLUS
INTRAVENOUS | Status: AC
  Filled 2016-04-03: qty 20

## 2016-04-03 MED ORDER — SIMVASTATIN 20 MG PO TABS
20.0000 mg | ORAL_TABLET | Freq: Every day | ORAL | Status: DC
  Administered 2016-04-04: 20 mg via ORAL
  Filled 2016-04-03: qty 1

## 2016-04-03 MED ORDER — PROPOFOL 500 MG/50ML IV EMUL
INTRAVENOUS | Status: DC | PRN
Start: 2016-04-03 — End: 2016-04-03
  Administered 2016-04-03: 50 ug/kg/min via INTRAVENOUS

## 2016-04-03 MED ORDER — METHYLENE BLUE 0.5 % INJ SOLN
INTRAVENOUS | Status: AC
  Filled 2016-04-03: qty 10

## 2016-04-03 MED ORDER — HYDROMORPHONE HCL 1 MG/ML IJ SOLN
0.2500 mg | INTRAMUSCULAR | Status: DC | PRN
  Administered 2016-04-03 (×4): 0.5 mg via INTRAVENOUS

## 2016-04-03 MED ORDER — PENTOSAN POLYSULFATE SODIUM 100 MG PO CAPS
100.0000 mg | ORAL_CAPSULE | Freq: Two times a day (BID) | ORAL | Status: DC
  Administered 2016-04-04 (×2): 100 mg via ORAL
  Filled 2016-04-03 (×4): qty 1

## 2016-04-03 MED ORDER — LINACLOTIDE 145 MCG PO CAPS
145.0000 ug | ORAL_CAPSULE | ORAL | Status: DC
  Filled 2016-04-03: qty 1

## 2016-04-03 MED ORDER — DOCUSATE SODIUM 100 MG PO CAPS
100.0000 mg | ORAL_CAPSULE | Freq: Two times a day (BID) | ORAL | Status: DC
  Administered 2016-04-03 – 2016-04-04 (×3): 100 mg via ORAL
  Filled 2016-04-03 (×2): qty 1

## 2016-04-03 MED ORDER — ACETAMINOPHEN 325 MG PO TABS: 650.0000 mg | ORAL_TABLET | ORAL | Status: DC | PRN

## 2016-04-03 MED ORDER — ZOLPIDEM TARTRATE 5 MG PO TABS: 5.0000 mg | ORAL_TABLET | Freq: Every evening | ORAL | Status: DC | PRN

## 2016-04-03 MED ORDER — ONDANSETRON HCL 4 MG/2ML IJ SOLN: 4.0000 mg | INTRAMUSCULAR | Status: DC | PRN

## 2016-04-03 MED ORDER — LIRAGLUTIDE 18 MG/3ML ~~LOC~~ SOPN
1.2000 mg | PEN_INJECTOR | Freq: Every day | SUBCUTANEOUS | Status: DC
  Administered 2016-04-03: 1.2 mg via SUBCUTANEOUS

## 2016-04-03 MED ORDER — OXYCODONE-ACETAMINOPHEN 5-325 MG PO TABS
1.0000 | ORAL_TABLET | ORAL | Status: DC | PRN
  Administered 2016-04-03 – 2016-04-05 (×9): 2 via ORAL
  Filled 2016-04-03 (×9): qty 2

## 2016-04-03 MED ORDER — MIDAZOLAM HCL 5 MG/5ML IJ SOLN
INTRAMUSCULAR | Status: DC | PRN
  Administered 2016-04-03: 2 mg via INTRAVENOUS

## 2016-04-03 MED ORDER — SUCRALFATE 1 GM/10ML PO SUSP
1.0000 g | Freq: Two times a day (BID) | ORAL | Status: DC
  Administered 2016-04-04 (×2): 1 g via ORAL
  Filled 2016-04-03 (×4): qty 10

## 2016-04-03 MED ORDER — AMITRIPTYLINE HCL 10 MG PO TABS
10.0000 mg | ORAL_TABLET | Freq: Every day | ORAL | Status: DC
  Administered 2016-04-04 (×2): 10 mg via ORAL
  Filled 2016-04-03 (×2): qty 1

## 2016-04-03 MED ORDER — MORPHINE SULFATE (PF) 2 MG/ML IV SOLN: 1.0000 mg | INTRAVENOUS | Status: DC | PRN

## 2016-04-03 MED ORDER — LACTATED RINGERS IV SOLN
INTRAVENOUS | Status: DC | PRN
  Administered 2016-04-03 (×2): via INTRAVENOUS

## 2016-04-03 MED ORDER — ROCURONIUM BROMIDE 50 MG/5ML IV SOLN
INTRAVENOUS | Status: AC
  Filled 2016-04-03: qty 1

## 2016-04-03 MED ORDER — BISACODYL 5 MG PO TBEC: 5.0000 mg | DELAYED_RELEASE_TABLET | Freq: Every day | ORAL | Status: DC | PRN

## 2016-04-03 MED ORDER — SODIUM CHLORIDE 0.9% FLUSH: 3.0000 mL | INTRAVENOUS | Status: DC | PRN

## 2016-04-03 MED ORDER — HYDROMORPHONE HCL 1 MG/ML IJ SOLN
INTRAMUSCULAR | Status: AC
  Filled 2016-04-03: qty 1

## 2016-04-03 MED ORDER — ONDANSETRON HCL 4 MG/2ML IJ SOLN
INTRAMUSCULAR | Status: AC
  Filled 2016-04-03: qty 2

## 2016-04-03 MED ORDER — INSULIN ASPART 100 UNIT/ML ~~LOC~~ SOLN
10.0000 [IU] | Freq: Three times a day (TID) | SUBCUTANEOUS | Status: DC
  Administered 2016-04-04 – 2016-04-05 (×3): 10 [IU] via SUBCUTANEOUS

## 2016-04-03 MED ORDER — GLIMEPIRIDE 1 MG PO TABS
1.0000 mg | ORAL_TABLET | Freq: Every day | ORAL | Status: DC
  Administered 2016-04-04 – 2016-04-05 (×2): 1 mg via ORAL
  Filled 2016-04-03 (×2): qty 1

## 2016-04-03 MED ORDER — DIAZEPAM 5 MG PO TABS
5.0000 mg | ORAL_TABLET | Freq: Four times a day (QID) | ORAL | Status: DC | PRN
  Administered 2016-04-03 – 2016-04-05 (×4): 5 mg via ORAL
  Filled 2016-04-03 (×4): qty 1

## 2016-04-03 MED ORDER — BUPIVACAINE-EPINEPHRINE 0.25% -1:200000 IJ SOLN
INTRAMUSCULAR | Status: DC | PRN
  Administered 2016-04-03: 6 mg

## 2016-04-03 MED ORDER — FENTANYL CITRATE (PF) 250 MCG/5ML IJ SOLN
INTRAMUSCULAR | Status: AC
  Filled 2016-04-03: qty 5

## 2016-04-03 MED ORDER — ACETAMINOPHEN 650 MG RE SUPP: 650.0000 mg | RECTAL | Status: DC | PRN

## 2016-04-03 MED ORDER — THROMBIN 20000 UNITS EX SOLR
CUTANEOUS | Status: DC | PRN
  Administered 2016-04-03: 20 mL via TOPICAL

## 2016-04-03 MED ORDER — PROMETHAZINE HCL 25 MG/ML IJ SOLN: 6.2500 mg | INTRAMUSCULAR | Status: DC | PRN

## 2016-04-03 MED ORDER — PROPOFOL 10 MG/ML IV BOLUS
INTRAVENOUS | Status: DC | PRN
  Administered 2016-04-03: 150 mg via INTRAVENOUS

## 2016-04-03 MED ORDER — LEVOTHYROXINE SODIUM 88 MCG PO TABS
88.0000 ug | ORAL_TABLET | Freq: Every day | ORAL | Status: DC
Start: 2016-04-04 — End: 2016-04-05
  Administered 2016-04-04 – 2016-04-05 (×2): 88 ug via ORAL
  Filled 2016-04-03 (×2): qty 1

## 2016-04-03 MED ORDER — SODIUM CHLORIDE 0.9 % IV SOLN
INTRAVENOUS | Status: DC
  Administered 2016-04-03: 22:00:00 via INTRAVENOUS

## 2016-04-03 MED ORDER — SUCCINYLCHOLINE CHLORIDE 200 MG/10ML IV SOSY
PREFILLED_SYRINGE | INTRAVENOUS | Status: AC
  Filled 2016-04-03: qty 10

## 2016-04-03 MED ORDER — MIDAZOLAM HCL 2 MG/2ML IJ SOLN
INTRAMUSCULAR | Status: AC
  Filled 2016-04-03: qty 2

## 2016-04-03 MED ORDER — SODIUM CHLORIDE 0.9% FLUSH
3.0000 mL | Freq: Two times a day (BID) | INTRAVENOUS | Status: DC
  Administered 2016-04-04: 3 mL via INTRAVENOUS

## 2016-04-03 MED ORDER — TRIMETHOPRIM 100 MG PO TABS
100.0000 mg | ORAL_TABLET | Freq: Every day | ORAL | Status: DC
  Administered 2016-04-04: 100 mg via ORAL
  Filled 2016-04-03 (×3): qty 1

## 2016-04-03 MED ORDER — FLEET ENEMA 7-19 GM/118ML RE ENEM: 1.0000 | ENEMA | Freq: Once | RECTAL | Status: DC | PRN

## 2016-04-03 MED ORDER — FENTANYL CITRATE (PF) 100 MCG/2ML IJ SOLN
INTRAMUSCULAR | Status: DC | PRN
  Administered 2016-04-03: 100 ug via INTRAVENOUS
  Administered 2016-04-03: 50 ug via INTRAVENOUS
  Administered 2016-04-03: 100 ug via INTRAVENOUS
  Administered 2016-04-03 (×6): 50 ug via INTRAVENOUS

## 2016-04-03 MED ORDER — 0.9 % SODIUM CHLORIDE (POUR BTL) OPTIME
TOPICAL | Status: DC | PRN
  Administered 2016-04-03: 1000 mL

## 2016-04-03 MED ORDER — AMLODIPINE BESYLATE-VALSARTAN 10-320 MG PO TABS: 1.0000 | ORAL_TABLET | Freq: Every day | ORAL | Status: DC

## 2016-04-03 MED ORDER — THROMBIN 20000 UNITS EX SOLR
CUTANEOUS | Status: AC
  Filled 2016-04-03: qty 20000

## 2016-04-03 MED ORDER — MENTHOL 3 MG MT LOZG
1.0000 | LOZENGE | OROMUCOSAL | Status: DC | PRN
Start: 2016-04-03 — End: 2016-04-05

## 2016-04-03 MED ORDER — LACTATED RINGERS IV SOLN
INTRAVENOUS | Status: DC
  Administered 2016-04-03 (×2): via INTRAVENOUS

## 2016-04-03 MED ORDER — ONDANSETRON HCL 4 MG/2ML IJ SOLN
INTRAMUSCULAR | Status: DC | PRN
  Administered 2016-04-03: 4 mg via INTRAVENOUS

## 2016-04-03 MED ORDER — ALUM & MAG HYDROXIDE-SIMETH 200-200-20 MG/5ML PO SUSP: 30.0000 mL | Freq: Four times a day (QID) | ORAL | Status: DC | PRN

## 2016-04-03 MED ORDER — ESTRADIOL 2 MG PO TABS
2.0000 mg | ORAL_TABLET | Freq: Every day | ORAL | Status: DC
  Administered 2016-04-04: 2 mg via ORAL
  Filled 2016-04-03 (×3): qty 1

## 2016-04-03 MED ORDER — ROCURONIUM BROMIDE 100 MG/10ML IV SOLN
INTRAVENOUS | Status: DC | PRN
  Administered 2016-04-03: 30 mg via INTRAVENOUS
  Administered 2016-04-03: 20 mg via INTRAVENOUS
  Administered 2016-04-03: 50 mg via INTRAVENOUS

## 2016-04-03 MED ORDER — AMLODIPINE BESYLATE 10 MG PO TABS
10.0000 mg | ORAL_TABLET | Freq: Every day | ORAL | Status: DC
  Administered 2016-04-04: 10 mg via ORAL
  Filled 2016-04-03 (×2): qty 1

## 2016-04-03 MED ORDER — GABAPENTIN 600 MG PO TABS
600.0000 mg | ORAL_TABLET | Freq: Two times a day (BID) | ORAL | Status: DC
  Administered 2016-04-03 – 2016-04-04 (×3): 600 mg via ORAL
  Filled 2016-04-03 (×3): qty 1

## 2016-04-03 MED ORDER — PHENOL 1.4 % MT LIQD: 1.0000 | OROMUCOSAL | Status: DC | PRN

## 2016-04-03 MED ORDER — PHENYLEPHRINE HCL 10 MG/ML IJ SOLN
10.0000 mg | INTRAVENOUS | Status: DC | PRN
  Administered 2016-04-03: 40 ug/min via INTRAVENOUS

## 2016-04-03 MED ORDER — PANTOPRAZOLE SODIUM 40 MG PO TBEC
40.0000 mg | DELAYED_RELEASE_TABLET | Freq: Every day | ORAL | Status: DC
  Administered 2016-04-04: 40 mg via ORAL
  Filled 2016-04-03: qty 1

## 2016-04-03 MED ORDER — ISOSORBIDE DINITRATE 10 MG PO TABS
10.0000 mg | ORAL_TABLET | Freq: Every day | ORAL | Status: DC
  Administered 2016-04-04: 10 mg via ORAL
  Filled 2016-04-03 (×2): qty 1

## 2016-04-03 MED ORDER — EPHEDRINE 5 MG/ML INJ
INTRAVENOUS | Status: AC
  Filled 2016-04-03: qty 10

## 2016-04-03 MED ORDER — SUGAMMADEX SODIUM 500 MG/5ML IV SOLN
INTRAVENOUS | Status: AC
  Filled 2016-04-03: qty 5

## 2016-04-03 MED ORDER — OXYCODONE HCL 5 MG PO TABS: 5.0000 mg | ORAL_TABLET | Freq: Once | ORAL | Status: DC | PRN

## 2016-04-03 MED ORDER — LIDOCAINE HCL (CARDIAC) 20 MG/ML IV SOLN
INTRAVENOUS | Status: DC | PRN
  Administered 2016-04-03: 100 mg via INTRAVENOUS

## 2016-04-03 MED ORDER — FLUOXETINE HCL 20 MG PO CAPS
20.0000 mg | ORAL_CAPSULE | Freq: Every day | ORAL | Status: DC
  Administered 2016-04-04 (×2): 20 mg via ORAL
  Filled 2016-04-03 (×2): qty 1

## 2016-04-03 MED ORDER — CLONIDINE HCL 0.1 MG PO TABS
0.1000 mg | ORAL_TABLET | Freq: Two times a day (BID) | ORAL | Status: DC
  Administered 2016-04-03 – 2016-04-04 (×2): 0.1 mg via ORAL
  Filled 2016-04-03 (×2): qty 1

## 2016-04-03 MED ORDER — ONDANSETRON HCL 4 MG PO TABS: 4.0000 mg | ORAL_TABLET | Freq: Four times a day (QID) | ORAL | Status: DC | PRN

## 2016-04-03 MED ORDER — VANCOMYCIN HCL 10 G IV SOLR
1500.0000 mg | Freq: Once | INTRAVENOUS | Status: AC
  Administered 2016-04-04: 1500 mg via INTRAVENOUS
  Filled 2016-04-03: qty 1500

## 2016-04-03 MED ORDER — POVIDONE-IODINE 7.5 % EX SOLN: Freq: Once | CUTANEOUS | Status: DC

## 2016-04-03 MED ORDER — IRBESARTAN 300 MG PO TABS
300.0000 mg | ORAL_TABLET | Freq: Every day | ORAL | Status: DC
  Administered 2016-04-04: 300 mg via ORAL
  Filled 2016-04-03 (×2): qty 1

## 2016-04-03 MED ORDER — OXYCODONE HCL 5 MG/5ML PO SOLN: 5.0000 mg | Freq: Once | ORAL | Status: DC | PRN

## 2016-04-03 MED ORDER — BUPIVACAINE-EPINEPHRINE (PF) 0.25% -1:200000 IJ SOLN
INTRAMUSCULAR | Status: AC
  Filled 2016-04-03: qty 30

## 2016-04-03 MED ORDER — SODIUM CHLORIDE 0.9 % IV SOLN
0.0200 mg | INTRAVENOUS | Status: DC | PRN
  Administered 2016-04-03: .02 mg via INTRAVENOUS

## 2016-04-03 MED ORDER — METOPROLOL TARTRATE 25 MG PO TABS
50.0000 mg | ORAL_TABLET | Freq: Every day | ORAL | Status: DC
  Administered 2016-04-03: 50 mg via ORAL
  Filled 2016-04-03: qty 2

## 2016-04-03 MED ORDER — CLINDAMYCIN PHOSPHATE 900 MG/50ML IV SOLN
INTRAVENOUS | Status: AC
  Administered 2016-04-03: 900 mg via INTRAVENOUS
  Filled 2016-04-03: qty 50

## 2016-04-03 MED ORDER — SUGAMMADEX SODIUM 200 MG/2ML IV SOLN
INTRAVENOUS | Status: DC | PRN
  Administered 2016-04-03: 250 mg via INTRAVENOUS

## 2016-04-03 SURGICAL SUPPLY — 79 items
BENZOIN TINCTURE PRP APPL 2/3 (GAUZE/BANDAGES/DRESSINGS) ×2 IMPLANT
BLADE SURG ROTATE 9660 (MISCELLANEOUS) IMPLANT
BUR PRESCISION 1.7 ELITE (BURR) IMPLANT
BUR ROUND PRECISION 4.0 (BURR) IMPLANT
BUR SABER RD CUTTING 3.0 (BURR) IMPLANT
CAGE CONCORDE BULLET 9X10X27 (Cage) ×2 IMPLANT
CARTRIDGE OIL MAESTRO DRILL (MISCELLANEOUS) ×1 IMPLANT
CONT SPEC STER OR (MISCELLANEOUS) ×2 IMPLANT
COVER MAYO STAND STRL (DRAPES) ×4 IMPLANT
COVER SURGICAL LIGHT HANDLE (MISCELLANEOUS) ×2 IMPLANT
DIFFUSER DRILL AIR PNEUMATIC (MISCELLANEOUS) ×2 IMPLANT
DRAIN CHANNEL 15F RND FF W/TCR (WOUND CARE) IMPLANT
DRAPE C-ARM 42X72 X-RAY (DRAPES) ×2 IMPLANT
DRAPE C-ARMOR (DRAPES) IMPLANT
DRAPE POUCH INSTRU U-SHP 10X18 (DRAPES) ×2 IMPLANT
DRAPE SURG 17X23 STRL (DRAPES) ×8 IMPLANT
DURAPREP 26ML APPLICATOR (WOUND CARE) ×2 IMPLANT
ELECT BLADE 4.0 EZ CLEAN MEGAD (MISCELLANEOUS) ×4
ELECT CAUTERY BLADE 6.4 (BLADE) ×2 IMPLANT
ELECT REM PT RETURN 9FT ADLT (ELECTROSURGICAL) ×2
ELECTRODE BLDE 4.0 EZ CLN MEGD (MISCELLANEOUS) ×2 IMPLANT
ELECTRODE REM PT RTRN 9FT ADLT (ELECTROSURGICAL) ×1 IMPLANT
EVACUATOR SILICONE 100CC (DRAIN) ×2 IMPLANT
FEE INTRAOP MONITOR IMPULS NCS (MISCELLANEOUS) ×1 IMPLANT
FILTER STRAW FLUID ASPIR (MISCELLANEOUS) ×2 IMPLANT
GAUZE SPONGE 4X4 12PLY STRL (GAUZE/BANDAGES/DRESSINGS) ×2 IMPLANT
GAUZE SPONGE 4X4 16PLY XRAY LF (GAUZE/BANDAGES/DRESSINGS) ×8 IMPLANT
GLOVE BIO SURGEON STRL SZ7 (GLOVE) ×2 IMPLANT
GLOVE BIO SURGEON STRL SZ8 (GLOVE) ×2 IMPLANT
GLOVE BIOGEL PI IND STRL 7.0 (GLOVE) ×2 IMPLANT
GLOVE BIOGEL PI IND STRL 8 (GLOVE) ×1 IMPLANT
GLOVE BIOGEL PI INDICATOR 7.0 (GLOVE) ×2
GLOVE BIOGEL PI INDICATOR 8 (GLOVE) ×1
GOWN STRL REUS W/ TWL LRG LVL3 (GOWN DISPOSABLE) ×2 IMPLANT
GOWN STRL REUS W/ TWL XL LVL3 (GOWN DISPOSABLE) ×1 IMPLANT
GOWN STRL REUS W/TWL LRG LVL3 (GOWN DISPOSABLE) ×2
GOWN STRL REUS W/TWL XL LVL3 (GOWN DISPOSABLE) ×1
INTRAOP MONITOR FEE IMPULS NCS (MISCELLANEOUS) ×1
INTRAOP MONITOR FEE IMPULSE (MISCELLANEOUS) ×1
IV CATH 14GX2 1/4 (CATHETERS) ×2 IMPLANT
KARTUSH CONCENTRIC STIMULATOR (MISCELLANEOUS) ×2
KIT BASIN OR (CUSTOM PROCEDURE TRAY) ×2 IMPLANT
KIT POSITION SURG JACKSON T1 (MISCELLANEOUS) ×2 IMPLANT
KIT ROOM TURNOVER OR (KITS) ×2 IMPLANT
MARKER SKIN DUAL TIP RULER LAB (MISCELLANEOUS) ×2 IMPLANT
MIX DBX 10CC 35% BONE (Bone Implant) ×2 IMPLANT
NDL SAFETY ECLIPSE 18X1.5 (NEEDLE) ×1 IMPLANT
NEEDLE 22X1 1/2 (OR ONLY) (NEEDLE) ×2 IMPLANT
NEEDLE HYPO 18GX1.5 SHARP (NEEDLE) ×1
NEEDLE HYPO 25GX1X1/2 BEV (NEEDLE) ×2 IMPLANT
NEEDLE SPNL 18GX3.5 QUINCKE PK (NEEDLE) ×6 IMPLANT
NS IRRIG 1000ML POUR BTL (IV SOLUTION) ×2 IMPLANT
OIL CARTRIDGE MAESTRO DRILL (MISCELLANEOUS) ×2
PACK LAMINECTOMY ORTHO (CUSTOM PROCEDURE TRAY) ×2 IMPLANT
PACK UNIVERSAL I (CUSTOM PROCEDURE TRAY) ×2 IMPLANT
PAD ARMBOARD 7.5X6 YLW CONV (MISCELLANEOUS) ×8 IMPLANT
PATTIES SURGICAL .5 X1 (DISPOSABLE) ×2 IMPLANT
PATTIES SURGICAL .5X1.5 (GAUZE/BANDAGES/DRESSINGS) ×2 IMPLANT
ROD PRE BENT EXP 40MM (Rod) ×4 IMPLANT
SCREW SET SINGLE INNER (Screw) ×8 IMPLANT
SCREW VIPER CORT FIX 6.00X30 (Screw) ×2 IMPLANT
SCREW VIPER CORT FIX 6X35 (Screw) ×6 IMPLANT
SPONGE INTESTINAL PEANUT (DISPOSABLE) ×4 IMPLANT
SPONGE SURGIFOAM ABS GEL 100 (HEMOSTASIS) ×2 IMPLANT
STIMULATOR KARTUSH CONCENTRIC (MISCELLANEOUS) ×1 IMPLANT
STRIP CLOSURE SKIN 1/2X4 (GAUZE/BANDAGES/DRESSINGS) ×2 IMPLANT
SURGIFLO W/THROMBIN 8M KIT (HEMOSTASIS) IMPLANT
SUT VIC AB 0 CT1 18XCR BRD 8 (SUTURE) ×1 IMPLANT
SUT VIC AB 0 CT1 8-18 (SUTURE) ×1
SUT VIC AB 1 CT1 18XCR BRD 8 (SUTURE) ×1 IMPLANT
SUT VIC AB 1 CT1 8-18 (SUTURE) ×1
SUT VIC AB 2-0 CT2 18 VCP726D (SUTURE) ×4 IMPLANT
SYR 20CC LL (SYRINGE) ×2 IMPLANT
SYR BULB IRRIGATION 50ML (SYRINGE) ×2 IMPLANT
SYR TB 1ML LUER SLIP (SYRINGE) ×2 IMPLANT
TOWEL OR 17X24 6PK STRL BLUE (TOWEL DISPOSABLE) ×2 IMPLANT
TOWEL OR 17X26 10 PK STRL BLUE (TOWEL DISPOSABLE) ×2 IMPLANT
WATER STERILE IRR 1000ML POUR (IV SOLUTION) ×2 IMPLANT
YANKAUER SUCT BULB TIP NO VENT (SUCTIONS) ×2 IMPLANT

## 2016-04-03 NOTE — Anesthesia Procedure Notes (Addendum)
Procedure Name: Intubation Date/Time: 04/03/2016 12:49 PM Performed by: Carney Living Pre-anesthesia Checklist: Patient being monitored, Timeout performed, Suction available, Emergency Drugs available and Patient identified Patient Re-evaluated:Patient Re-evaluated prior to inductionOxygen Delivery Method: Circle system utilized Preoxygenation: Pre-oxygenation with 100% oxygen Intubation Type: IV induction Ventilation: Oral airway inserted - appropriate to patient size Laryngoscope Size: Mac and 4 Grade View: Grade II Tube type: Oral Tube size: 7.5 mm Number of attempts: 1 Airway Equipment and Method: Stylet Placement Confirmation: ETT inserted through vocal cords under direct vision,  positive ETCO2 and breath sounds checked- equal and bilateral Secured at: 21 cm Tube secured with: Tape Dental Injury: Teeth and Oropharynx as per pre-operative assessment  Comments: Intubation performed by Sanjuana Mae, SRNA

## 2016-04-03 NOTE — Progress Notes (Signed)
ANTIBIOTIC CONSULT NOTE - INITIAL  Pharmacy Consult for vancomycin Indication: surgical prophylaxis  Allergies  Allergen Reactions  . Hydrocodone Other (See Comments)    tachycardia  . Ciprofloxacin Other (See Comments)    dizzy  . Penicillins Hives    Has patient had a PCN reaction causing immediate rash, facial/tongue/throat swelling, SOB or lightheadedness with hypotension: Yes Has patient had a PCN reaction causing severe rash involving mucus membranes or skin necrosis: No Has patient had a PCN reaction that required hospitalization No Has patient had a PCN reaction occurring within the last 10 years: Yes If all of the above answers are "NO", then may proceed with Cephalosporin use.     Patient Measurements: Weight: 275 lb (124.739 kg)  Vital Signs: Temp: 97.5 F (36.4 C) (06/28 1801) Temp Source: Oral (06/28 0927) BP: 127/73 mmHg (06/28 1915) Pulse Rate: 59 (06/28 1915) Intake/Output from previous day:   Intake/Output from this shift:    Labs:  Recent Labs  04/03/16 0938  CREATININE 1.49*   Estimated Creatinine Clearance: 49.3 mL/min (by C-G formula based on Cr of 1.49). No results for input(s): VANCOTROUGH, VANCOPEAK, VANCORANDOM, GENTTROUGH, GENTPEAK, GENTRANDOM, TOBRATROUGH, TOBRAPEAK, TOBRARND, AMIKACINPEAK, AMIKACINTROU, AMIKACIN in the last 72 hours.   Microbiology: Recent Results (from the past 720 hour(s))  Surgical pcr screen     Status: Abnormal   Collection Time: 03/28/16  3:37 PM  Result Value Ref Range Status   MRSA, PCR NEGATIVE NEGATIVE Final   Staphylococcus aureus POSITIVE (A) NEGATIVE Final    Comment:        The Xpert SA Assay (FDA approved for NASAL specimens in patients over 69 years of age), is one component of a comprehensive surveillance program.  Test performance has been validated by [facility] for patients greater than or equal to 50 year old. It is not intended to diagnose infection nor to guide or monitor treatment.      Medical History: Past Medical History  Diagnosis Date  . COMMON MIGRAINE   . DIVERTICULOSIS-COLON   . CORONARY ARTERY DISEASE   . Gastroparesis   . INSOMNIA-SLEEP DISORDER-UNSPEC   . Iron deficiency anemia   . Anxiety   . DEPRESSION   . Diabetes mellitus, type II ([place])   . GERD (gastroesophageal reflux disease)   . Hyperlipidemia   . Hypertension   . Gastric ulcer 04/2008  . CERVICAL RADICULOPATHY, LEFT   . Hiatal hernia   . Hypothyroidism   . Arthritis   . Wears glasses   . CKD (chronic kidney disease)    Assessment: 65 y.o. female who presents with ongoing pain in the right leg x 2 years. 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 Clindamycin given pre-op. No drain mentioned in note or by receiving Rn. Will schedule 1x dose of vancomycin tonight.  Goal of Therapy:  Vancomycin trough level 10-15 mcg/ml  Plan:  Vancomycin 1500mg  IV x1 tonight  Erin Hearing PharmD., BCPS Clinical Pharmacist Pager [phone] 04/03/2016 8:12 PM
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
## 2016-04-03 NOTE — Transfer of Care (Signed)
Immediate Anesthesia Transfer of Care Note  Patient: [name]  Procedure(s) Performed: Procedure(s) with comments: RIGHT SIDED LUMBAR 4-5 TRANSFORAMINAL INTERBODY FUSION WITH INSTRUMENTATION AND ALLOGRAFT (Right) - RIGHT SIDED LUMBAR 4-5 TRANSFORAMINAL INTERBODY FUSION WITH INSTRUMENTATION AND ALLOGRAFT  Patient Location: PACU  Anesthesia Type:General  Level of Consciousness: awake and patient cooperative  Airway & Oxygen Therapy: Patient Spontanous Breathing and Patient connected to face mask oxygen  Post-op Assessment: Report given to RN and Post -op Vital signs reviewed and stable  Post vital signs: Reviewed and stable  Last Vitals:  Filed Vitals:   04/03/16 0927  BP: 161/65  Pulse: 66  Temp: 36.9 C  Resp: 20    Last Pain:  Filed Vitals:   04/03/16 0953  PainSc: 6       Patients Stated Pain Goal: 2 ([id number])  Complications: No apparent anesthesia complications

## 2016-04-03 NOTE — Anesthesia Preprocedure Evaluation (Addendum)
Anesthesia Evaluation  Patient identified by MRN, date of birth, ID band Patient awake    Reviewed: Allergy & Precautions, H&P , NPO status , Patient's Chart, lab work & pertinent test results  History of Anesthesia Complications Negative for: history of anesthetic complications  Airway Mallampati: II  TM Distance: >3 FB Neck ROM: full    Dental no notable dental hx. (+) Teeth Intact, Dental Advisory Given   Pulmonary former smoker,    Pulmonary exam normal breath sounds clear to auscultation       Cardiovascular hypertension, Pt. on medications and Pt. on home beta blockers + CAD  Normal cardiovascular exam Rhythm:regular Rate:Normal  Has cardiac clearance from Dr. Terrence Dupont for lumbar surgery 03/2016, has normal EF    Neuro/Psych  Headaches, Anxiety Depression    GI/Hepatic Neg liver ROS, hiatal hernia, PUD, GERD  Medicated and Controlled,  Endo/Other  negative endocrine ROSdiabetes, Type 2Hypothyroidism Morbid obesity  Renal/GU Renal disease     Musculoskeletal  (+) Arthritis , Osteoarthritis,    Abdominal   Peds  Hematology  (+) anemia ,   Anesthesia Other Findings   Reproductive/Obstetrics negative OB ROS                          Anesthesia Physical Anesthesia Plan  ASA: III  Anesthesia Plan: General   Post-op Pain Management:    Induction: Intravenous  Airway Management Planned: Oral ETT  Additional Equipment:   Intra-op Plan:   Post-operative Plan: Possible Post-op intubation/ventilation  Informed Consent: I have reviewed the patients History and Physical, chart, labs and discussed the procedure including the risks, benefits and alternatives for the proposed anesthesia with the patient or authorized representative who has indicated his/her understanding and acceptance.   Dental Advisory Given  Plan Discussed with: Anesthesiologist, CRNA and Surgeon  Anesthesia Plan  Comments:         Anesthesia Quick Evaluation

## 2016-04-04 LAB — GLUCOSE, CAPILLARY
Glucose-Capillary: 221 mg/dL — ABNORMAL HIGH (ref 65–99)
Glucose-Capillary: 168 mg/dL — ABNORMAL HIGH (ref 65–99)
Glucose-Capillary: 50 mg/dL — ABNORMAL LOW (ref 65–99)
Glucose-Capillary: 71 mg/dL (ref 65–99)
Glucose-Capillary: 140 mg/dL — ABNORMAL HIGH (ref 65–99)

## 2016-04-04 NOTE — Evaluation (Signed)
Occupational Therapy Evaluation Patient Details Name: [name] MRN: [id number] DOB: 1950/10/21 Today's Date: 04/04/2016    History of Present Illness Pt is a 65 y.o. female s/p TLIF L4-5, posterolateral fusion L4-5, decompression L4-5. PMHx: CAD, HTN, Anxiety, Depression, DM2, GERD, Cervical radiculopathy, CKD, L Rotator cuff repair, Bil TKA.       Clinical Impression   Pt reports she managed ADLs independently most of the time, sometimes required assist with LB dressing PTA. Currently pt requires physical +2 assist to perform bed mobility and sit to stand from EOB. Overall min guard +2 for safety with functional mobility. Pt max assist for LB ADLs and donning back brace. Pt reports significant pain and requires max verbal encouragement to participate in OOB activities. Due to current level of assist required for ADLs and functional mobility; recommending SNF for follow up. If pt does not d/c to SNF; will need HHOT for follow up to maximize independence and safety with ADLs and functional mobility. Pt would benefit from continued skilled OT to address established goals.    Follow Up Recommendations  SNF;Supervision/Assistance - 24 hour (if pt refuses SNF, will need HHOT)    Equipment Recommendations  3 in 1 bedside comode (wide 3 in 1 )    Recommendations for Other Services       Precautions / Restrictions Precautions Precautions: Back;Fall Precaution Booklet Issued: Yes (comment) Precaution Comments: Reviewed back precautions with pt. Required Braces or Orthoses: Spinal Brace Spinal Brace: Thoracolumbosacral orthotic;Applied in sitting position Restrictions Weight Bearing Restrictions: No      Mobility Bed Mobility Overal bed mobility: Needs Assistance;+2 for physical assistance Bed Mobility: Rolling;Sidelying to Sit Rolling: Max assist;+2 for physical assistance Sidelying to sit: Max assist;+2 for physical assistance       General bed mobility comments: HOB flat  with use of bed rails. Cues for log roll technique. Assist to bring bil LEs to EOB and off side of bed, assist for rolling, and assist to bring trunk from sidelying to sitting position. Pt required assist with use of bed pad to scoot hips to EOB.  Transfers Overall transfer level: Needs assistance Equipment used: Rolling walker (2 wheeled) Transfers: Sit to/from Stand Sit to Stand: Mod assist;+2 physical assistance;From elevated surface         General transfer comment: Mod assist +2 to boost up from elevated EOB. Use of bed pad to boost up.    Balance Overall balance assessment: Needs assistance Sitting-balance support: Bilateral upper extremity supported;Feet supported Sitting balance-Leahy Scale: Fair     Standing balance support: Bilateral upper extremity supported Standing balance-Leahy Scale: Poor Standing balance comment: RW for support                            ADL Overall ADL's : Needs assistance/impaired Eating/Feeding: Set up;Sitting   Grooming: Minimal assistance;Sitting   Upper Body Bathing: Minimal assitance;Sitting   Lower Body Bathing: Maximal assistance;Sit to/from stand;+2 for safety/equipment   Upper Body Dressing : Maximal assistance;Sitting Upper Body Dressing Details (indicate cue type and reason): to don brace Lower Body Dressing: Maximal assistance;Sit to/from stand;+2 for safety/equipment Lower Body Dressing Details (indicate cue type and reason): Educated pt on compensatory strategies for LB ADLs; pt unable to cross foot over opposite knee. Reports husband can assist as needed; no need for AE. Toilet Transfer: Moderate assistance;+2 for physical assistance;Ambulation;BSC;RW Toilet Transfer Details (indicate cue type and reason): Mod assist +2 for sit to stand and min  guard +2 for safety with mobility. Simulated by sit to stand from EOB. Toileting- Clothing Manipulation and Hygiene: Total assistance;Sit to/from stand       Functional  mobility during ADLs: Min guard;+2 for safety/equipment;Rolling walker General ADL Comments: Pt moves very slow and reports significant pain; requires a lot of verbal encouragement to participate in therapy.      Vision     Perception     Praxis      Pertinent Vitals/Pain Pain Assessment: 0-10 Pain Score: 10-Worst pain ever Pain Location: back, R leg Pain Descriptors / Indicators: Aching;Sharp;Shooting Pain Intervention(s): Monitored during session;Premedicated before session;Repositioned     Hand Dominance Right   Extremity/Trunk Assessment Upper Extremity Assessment Upper Extremity Assessment: Generalized weakness   Lower Extremity Assessment Lower Extremity Assessment: Defer to PT evaluation   Cervical / Trunk Assessment Cervical / Trunk Assessment: Other exceptions Cervical / Trunk Exceptions: s/p lumbar sx   Communication Communication Communication: No difficulties   Cognition Arousal/Alertness: Awake/alert Behavior During Therapy: Flat affect Overall Cognitive Status: Within Functional Limits for tasks assessed                     General Comments       Exercises       Shoulder Instructions      Home Living Family/patient expects to be discharged to:: Private residence Living Arrangements: Spouse/significant other;Children Available Help at Discharge: Family Type of Home: House Home Access: Stairs to enter Technical brewer of Steps: 4 Entrance Stairs-Rails: Right Home Layout: One level     Bathroom Shower/Tub: Occupational psychologist: Standard     Home Equipment: Cane - single point          Prior Functioning/Environment Level of Independence: Needs assistance  Gait / Transfers Assistance Needed: ambulated independently, ocassional use of cane ADL's / Homemaking Assistance Needed: sometimes would have help with lower body dressing        OT Diagnosis: Generalized weakness;Acute pain   OT Problem List:  Decreased strength;Decreased activity tolerance;Decreased range of motion;Impaired balance (sitting and/or standing);Decreased coordination;Decreased safety awareness;Decreased knowledge of use of DME or AE;Decreased knowledge of precautions;Obesity;Pain   OT Treatment/Interventions: Self-care/ADL training;DME and/or AE instruction;Energy conservation;Therapeutic activities;Patient/family education;Balance training    OT Goals(Current goals can be found in the care plan section) Acute Rehab OT Goals Patient Stated Goal: decrease pain OT Goal Formulation: With patient Time For Goal Achievement: 04/18/16 Potential to Achieve Goals: Good ADL Goals Pt Will Perform Grooming: with supervision;standing Pt Will Transfer to Toilet: with supervision;ambulating;bedside commode Pt Will Perform Toileting - Clothing Manipulation and hygiene: with supervision;sit to/from stand;with adaptive equipment Additional ADL Goal #1: Pt/caregiver will independently don/doff back brace as precursor for ADLs and mobility. Additional ADL Goal #2: Pt will independently verbally recall 3/3 back precautions and maintain throughout ADL.  OT Frequency: Min 2X/week   Barriers to D/C:            Co-evaluation PT/OT/SLP Co-Evaluation/Treatment: Yes Reason for Co-Treatment: For patient/therapist safety   OT goals addressed during session: ADL's and self-care;Other (comment) (mobility)      End of Session Equipment Utilized During Treatment: Gait belt;Rolling walker;Back brace Nurse Communication: Mobility status  Activity Tolerance: Patient limited by pain Patient left: in chair;with call bell/phone within reach;with family/visitor present   Time: [id number] OT Time Calculation (min): 41 min Charges:  OT General Charges $OT Visit: 1 Procedure OT Evaluation $OT Eval Moderate Complexity: 1 Procedure G-Codes:     Binnie Kand  M.S., OTR/L Pager: [phone]  04/04/2016, 10:29 AM

## 2016-04-04 NOTE — Progress Notes (Signed)
    Patient doing well + LBP Patient reports improved R leg pain, no pain below the knee, + discomfort from hip to knee, improved Has been ambulating   Physical Exam: Filed Vitals:   04/04/16 0010 04/04/16 0400  BP: 121/73 110/65  Pulse: 60 62  Temp: 97.5 F (36.4 C) 97.6 F (36.4 C)  Resp: 18 20    Patient laying supine Dressing in place NVI  Drain outout: 225cc/12 hours  POD #1 s/p L4/5 decompression and fusion  - up with PT/OT, encourage ambulation - brace when up and ambulating - Percocet for pain, Valium for muscle spasms - likely d/c home later today after PT/OT depending on progress with therapy - maintain drain for now

## 2016-04-04 NOTE — Evaluation (Addendum)
Physical Therapy Evaluation Patient Details Name: [name] MRN: [id number] DOB: [birth year] Today's Date: 04/04/2016   History of Present Illness  Pt is a 65 y.o. female s/p TLIF L4-5, posterolateral fusion L4-5, decompression L4-5. PMHx: CAD, HTN, Anxiety, Depression, DM2, GERD, Cervical radiculopathy, CKD, L Rotator cuff repair, Bil TKA.      Clinical Impression  Patient significantly limited in fucntional mobility and activity tolerance. Presents with deficits as indicated below. Will need continued skilled PT to address deficits and maximize function. At this time, patient unable to get OOB without +2 max assist, required significant encouragement for all aspects of mobility and unsafe to perform stair negotiation or extended mobility. Spoke with patient at length regarding need for improved mobility to return home. Encouraged continued mobility with staff.    Follow Up Recommendations SNF;Supervision/Assistance - 24 hour (if patient refuses SNF will need HHPT)    Equipment Recommendations  Rolling walker with 5" wheels;3in1 (PT)    Recommendations for Other Services       Precautions / Restrictions Precautions Precautions: Back;Fall Precaution Booklet Issued: Yes (comment) Precaution Comments: Reviewed back precautions with pt. Required Braces or Orthoses: Spinal Brace Spinal Brace: Thoracolumbosacral orthotic;Applied in sitting position Restrictions Weight Bearing Restrictions: No      Mobility  Bed Mobility Overal bed mobility: Needs Assistance;+2 for physical assistance Bed Mobility: Rolling;Sidelying to Sit Rolling: Max assist;+2 for physical assistance Sidelying to sit: Max assist;+2 for physical assistance       General bed mobility comments: HOB flat with use of bed rails. Cues for log roll technique. Assist to bring bil LEs to EOB and off side of bed, assist for rolling, and assist to bring trunk from sidelying to sitting position. Pt required assist with use  of bed pad to scoot hips to EOB.  Transfers Overall transfer level: Needs assistance Equipment used: Rolling walker (2 wheeled) Transfers: Sit to/from Stand Sit to Stand: Mod assist;+2 physical assistance;From elevated surface         General transfer comment: Mod assist +2 to boost up from elevated EOB. Use of bed pad to boost up.  Ambulation/Gait Ambulation/Gait assistance: Min assist Ambulation Distance (Feet): 40 Feet Assistive device: Rolling walker (2 wheeled) Gait Pattern/deviations: Step-to pattern;Decreased stride length;Shuffle;Drifts right/left Gait velocity: significantly decreased Gait velocity interpretation: Below normal speed for age/gender General Gait Details: patient extremely slow, guarded and tense during ambulation, requires maximal multi-modal cues for encouragement. limited activity tolerance.   Stairs            Wheelchair Mobility    Modified Rankin (Stroke Patients Only)       Balance Overall balance assessment: Needs assistance Sitting-balance support: Bilateral upper extremity supported;Feet supported Sitting balance-Leahy Scale: Fair     Standing balance support: Bilateral upper extremity supported Standing balance-Leahy Scale: Poor Standing balance comment: RW for support                             Pertinent Vitals/Pain Pain Assessment: 0-10 Pain Score: 10-Worst pain ever Pain Location: back, R leg Pain Descriptors / Indicators: Aching;Sharp;Shooting Pain Intervention(s): Monitored during session;Premedicated before session;Repositioned    Home Living Family/patient expects to be discharged to:: Private residence Living Arrangements: Spouse/significant other;Children Available Help at Discharge: Family Type of Home: House Home Access: Stairs to enter Entrance Stairs-Rails: Right Entrance Stairs-Number of Steps: 4 Home Layout: One level [place] - single point      Prior Function Level of  Independence: Needs assistance   Gait / Transfers Assistance Needed: ambulated independently, ocassional use of cane  ADL's / Homemaking Assistance Needed: sometimes would have help with lower body dressing        Hand Dominance   Dominant Hand: Right    Extremity/Trunk Assessment   Upper Extremity Assessment: Generalized weakness           Lower Extremity Assessment: unable to fully assess due to pain, RLE       Cervical / Trunk Assessment: Other exceptions  Communication   Communication: No difficulties  Cognition Arousal/Alertness: Awake/alert Behavior During Therapy: Flat affect Overall Cognitive Status: Within Functional Limits for tasks assessed                      General Comments      Exercises        Assessment/Plan    PT Assessment Patient needs continued PT services  PT Diagnosis Difficulty walking;Abnormality of gait;Generalized weakness;Acute pain   PT Problem List Decreased strength;Decreased range of motion;Decreased activity tolerance;Decreased balance;Decreased mobility;Decreased coordination;Decreased knowledge of use of DME;Decreased knowledge of precautions;Obesity;Pain  PT Treatment Interventions DME instruction;Gait training;Stair training;Functional mobility training;Therapeutic activities;Therapeutic exercise;Balance training;Patient/family education   PT Goals (Current goals can be found in the Care Plan section) Acute Rehab PT Goals Patient Stated Goal: decrease pain PT Goal Formulation: With patient Time For Goal Achievement: 04/18/16 Potential to Achieve Goals: Fair    Frequency Min 5X/week   Barriers to discharge Inaccessible home environment has to perform 5 steps to enter home    Co-evaluation PT/OT/SLP Co-Evaluation/Treatment: Yes Reason for Co-Treatment: For patient/therapist safety PT goals addressed during session: Mobility/safety with mobility OT goals addressed during session: ADL's and self-care;Other  (comment) (mobility)       End of Session Equipment Utilized During Treatment: Gait belt;Back brace Activity Tolerance: Patient limited by fatigue;Patient limited by pain Patient left: in chair;with call bell/phone within reach;with family/visitor present Nurse Communication: Mobility status         Time: [id number] PT Time Calculation (min) (ACUTE ONLY): 40 min   Charges:   PT Evaluation $PT Eval Moderate Complexity: 1 Procedure     PT G CodesDuncan Dull 04/09/2016, 11:30 AM Alben Deeds, [place] DPT  [phone]

## 2016-04-04 NOTE — Progress Notes (Signed)
Hypoglycemic Event  CBG:50  Treatment: 4oz orange juice  Symptoms: None  Follow-up CBG: Time:1715 CBG Result:71  Possible Reasons for Event: meds?  Comments/MD notified: None    Terreon Ekholm, Royston Bake

## 2016-04-05 ENCOUNTER — Telehealth: Payer: Self-pay | Admitting: *Deleted

## 2016-04-05 LAB — GLUCOSE, CAPILLARY: Glucose-Capillary: 175 mg/dL — ABNORMAL HIGH (ref 65–99)

## 2016-04-05 MED FILL — Sodium Chloride IV Soln 0.9%: INTRAVENOUS | Qty: 1000 | Status: AC

## 2016-04-05 MED FILL — Heparin Sodium (Porcine) Inj 1000 Unit/ML: INTRAMUSCULAR | Qty: 30 | Status: AC

## 2016-04-05 NOTE — Anesthesia Postprocedure Evaluation (Signed)
Anesthesia Post Note  Patient: [name]  Procedure(s) Performed: Procedure(s) (LRB): RIGHT SIDED LUMBAR 4-5 TRANSFORAMINAL INTERBODY FUSION WITH INSTRUMENTATION AND ALLOGRAFT (Right)  Patient location during evaluation: PACU Anesthesia Type: General Level of consciousness: awake and alert Pain management: pain level controlled Vital Signs Assessment: post-procedure vital signs reviewed and stable Respiratory status: spontaneous breathing, nonlabored ventilation, respiratory function stable and patient connected to nasal cannula oxygen Cardiovascular status: blood pressure returned to baseline and stable Postop Assessment: no signs of nausea or vomiting Anesthetic complications: no     Last Vitals:  Filed Vitals:   04/05/16 0416 04/05/16 0754  BP: 119/64 116/91  Pulse: 86 85  Temp: 36.9 C 37.2 C  Resp: 20 18    Last Pain:  Filed Vitals:   04/05/16 0755  PainSc: Asleep   Pain Goal: Patients Stated Pain Goal: 4 (04/05/16 0501)               Tiajuana Amass

## 2016-04-05 NOTE — Progress Notes (Signed)
Occupational Therapy Treatment Patient Details Name: JENNAVIE SALYARDS MRN: [id number] DOB: 10/16/1950 Today's Date: 04/05/2016    History of present illness Pt is a 65 y.o. female s/p TLIF L4-5, posterolateral fusion L4-5, decompression L4-5. PMHx: CAD, HTN, Anxiety, Depression, DM2, GERD, Cervical radiculopathy, CKD, L Rotator cuff repair, Bil TKA.       OT comments  Focus of session today on back, safety, and ADL education with pt and husband. Reviewed all back precautions in relation to functional activities. Pts husband able to demo donning/doffing back brace. Updated d/c plan to home with [facility] for follow up with 24/7 supervision. Will continue to follow acutely.   Follow Up Recommendations  Home health OT;Supervision/Assistance - 24 hour    Equipment Recommendations  3 in 1 bedside comode (wide 3 in 1)    Recommendations for Other Services      Precautions / Restrictions Precautions Precautions: Back;Fall Precaution Booklet Issued: No Precaution Comments: Pt able to recall 2/3 back precautions; reviewed all precautions with pt and husband. Required Braces or Orthoses: Spinal Brace Spinal Brace: Thoracolumbosacral orthotic;Applied in sitting position Restrictions Weight Bearing Restrictions: No       Mobility Bed Mobility               General bed mobility comments: Pt sitting EOB upon arrival.  Transfers                      Balance Overall balance assessment: Needs assistance Sitting-balance support: Feet supported;No upper extremity supported Sitting balance-Leahy Scale: Good                             ADL Overall ADL's : Needs assistance/impaired Eating/Feeding: Independent;Sitting     Grooming Details (indicate cue type and reason): Educated pt on use of 2 cups for oral care.         Upper Body Dressing : Maximal assistance;Sitting;With caregiver independent assisting Upper Body Dressing Details (indicate cue type and reason):  Educated pt and husband on donning/doffing back brace. Husband able to demo assist with donning/doffing brace.   Lower Body Dressing Details (indicate cue type and reason): Attempted crossing foot over opposite knee; pt unable. Husband reports he can assist as needed.               General ADL Comments: Educated pt on use of AE for LB ADLs if interested, keeping frequently used items at counter top height, no reaching or bending to get items out of cabinets in kitchen, brace wear schedule.      Vision                     Perception     Praxis      Cognition   Behavior During Therapy: Flat affect Overall Cognitive Status: Within Functional Limits for tasks assessed                       Extremity/Trunk Assessment               Exercises     Shoulder Instructions       General Comments      Pertinent Vitals/ Pain       Pain Assessment: 0-10 Pain Score: 5  Pain Location: back Pain Descriptors / Indicators: Aching;Sore Pain Intervention(s): Monitored during session  Home Living  Prior Functioning/Environment              Frequency Min 2X/week     Progress Toward Goals  OT Goals(current goals can now be found in the care plan section)  Progress towards OT goals: Progressing toward goals  Acute Rehab OT Goals Patient Stated Goal: decrease pain OT Goal Formulation: With patient  Plan Discharge plan needs to be updated    Co-evaluation                 End of Session Equipment Utilized During Treatment: Back brace   Activity Tolerance Patient tolerated treatment well   Patient Left in bed;with call bell/phone within reach;with family/visitor present (sitting EOB)   Nurse Communication Other (comment) (updating d/c plan)        [id number] OT Time Calculation (min): 23 min  Charges: OT General Charges $OT Visit: 1 Procedure OT Treatments $Self  Care/Home Management : 23-37 mins  Binnie Kand M.S., OTR/L Pager: [phone]  04/05/2016, 9:23 AM

## 2016-04-05 NOTE — Care Management Important Message (Signed)
Important Message  Patient Details  Name: [name] MRN: [id number] Date of Birth: 19-Jan-1951   Medicare Important Message Given:  Yes    Averee Harb Abena 04/05/2016, 11:21 AM

## 2016-04-05 NOTE — Care Management Note (Signed)
Case Management Note  Patient Details  Name: [name] MRN: [id number] Date of Birth: [birth year]  Subjective/Objective:            S/p L 4-5 TLIF        Action/Plan: Spoke with patient and husband about home health, gave choice, they selected [facility]. Contacted Tim[facility] Lucarelli at [place] at [address]. Patient's husband stated that patient will have someone with her 24/7. Rolling walker to be delivered to patient's room by [facility].      Expected Discharge Date:                  Expected Discharge Plan:  [place]  In-House Referral:  NA  Discharge planning Services  CM Consult  Post Acute Care Choice:  Durable Medical Equipment, Home Health Choice offered to:  Patient  DME Arranged:  Walker rolling DME Agency:  [place] Arranged:  PT, OT [place] Agency:  [facility] (now [facility])  Status of Service:  Completed, signed off  If discussed at [place] of Stay Meetings, dates discussed:    Additional Comments:  Nila Nephew, RN 04/05/2016, 11:14 AM

## 2016-04-05 NOTE — Progress Notes (Signed)
Physical Therapy Treatment Patient Details Name: [name] MRN: [id number] DOB: [birth year] Today's Date: 04/05/2016    History of Present Illness Pt is a 65 y.o. female s/p TLIF L4-5, posterolateral fusion L4-5, decompression L4-5. PMHx: CAD, HTN, Anxiety, Depression, DM2, GERD, Cervical radiculopathy, CKD, L Rotator cuff repair, Bil TKA.        PT Comments    Patient seen for mobility progression and training for d/c home. Patient remains significantly limited in activity tolerance and physical ability. Concerned regarding patient ability to mobilize safely. Will need continued physical assist. Patient insistent on d/c home. Highly recommend HHPT and use of standard rolling walker as patient demonstrates limited activity tolerance, strength, and safety.   Follow Up Recommendations  Home health PT;Supervision/Assistance - 24 hour (if patient refuses SNF will need HHPT)     Equipment Recommendations  Rolling walker with 5" wheels;3in1 (PT)    Recommendations for Other Services       Precautions / Restrictions Precautions Precautions: Back;Fall Precaution Booklet Issued: No Precaution Comments: Pt able to recall 2/3 back precautions; reviewed all precautions with pt and husband. Required Braces or Orthoses: Spinal Brace Spinal Brace: Thoracolumbosacral orthotic;Applied in sitting position Restrictions Weight Bearing Restrictions: No    Mobility  Bed Mobility               General bed mobility comments: Pt sitting EOB upon arrival.  Transfers Overall transfer level: Needs assistance Equipment used: Rolling walker (2 wheeled) Transfers: Sit to/from Stand Sit to Stand: Min assist;+2 physical assistance;From elevated surface         General transfer comment: min assist to power up to standing, patient with increased time and effort to perform  Ambulation/Gait Ambulation/Gait assistance: Min assist Ambulation Distance (Feet): 120 Feet Assistive device: Rolling  walker (2 wheeled) Gait Pattern/deviations: Step-to pattern;Decreased stride length;Shuffle;Drifts right/left Gait velocity: significantly decreased Gait velocity interpretation: Below normal speed for age/gender General Gait Details: remains significantly limited with activity tolerance and giat, noted weakness, 3 standing rest breaks. Patient very slow with gait speed (max cues for safety and postioning with RW)   Stairs Stairs: Yes Stairs assistance: Mod assist Stair Management: Forwards Number of Stairs: 3 General stair comments: moderate assist for stability, educated patient and spouse on assist and hand placement  Wheelchair Mobility    Modified Rankin (Stroke Patients Only)       Balance Overall balance assessment: Needs assistance Sitting-balance support: Feet supported;No upper extremity supported Sitting balance-Leahy Scale: Good     Standing balance support: Bilateral upper extremity supported Standing balance-Leahy Scale: Poor Standing balance comment: heavy reliance on RW                    Cognition Arousal/Alertness: Awake/alert Behavior During Therapy: Flat affect Overall Cognitive Status: Within Functional Limits for tasks assessed                      Exercises      General Comments        Pertinent Vitals/Pain Pain Assessment: 0-10 Pain Score: 5  Pain Location: back Pain Descriptors / Indicators: Sore Pain Intervention(s): Monitored during session    Home Living                      Prior Function            PT Goals (current goals can now be found in the care plan section) Acute Rehab PT Goals Patient Stated Goal:  decrease pain PT Goal Formulation: With patient Time For Goal Achievement: 04/18/16 Potential to Achieve Goals: Fair Progress towards PT goals: Progressing toward goals    Frequency  Min 5X/week    PT Plan Discharge plan needs to be updated    Co-evaluation             End of Session  Equipment Utilized During Treatment: Gait belt;Back brace Activity Tolerance: Patient limited by fatigue;Patient limited by pain Patient left:  (sitting EOB)     Time: 1000-1024 PT Time Calculation (min) (ACUTE ONLY): 24 min  Charges:  $Gait Training: 8-22 mins $Self Care/Home Management: 8-22                    G CodesDuncan Dull 2016-04-09, 10:32 AM  Alben Deeds, PT DPT  [phone]

## 2016-04-05 NOTE — Progress Notes (Signed)
    Patient doing well LBP improved R leg pain improved   Physical Exam: Filed Vitals:   04/04/16 2349 04/05/16 0416  BP: 83/48 119/64  Pulse: 75 86  Temp: 98.2 F (36.8 C) 98.4 F (36.9 C)  Resp: 20 20    Dressing in place NVI  POD #2 s/p L4/5 procedure doing well  - up with PT/OT, encourage ambulation - Percocet for pain, Valium for muscle spasms - likely d/c home later today

## 2016-04-05 NOTE — Progress Notes (Signed)
Pt and husband given D/C instructions with Rx's, verbal understanding was provided. Pt's incision is clean and dry with no sign of infection. Pt's JP drain and IV's were removed prior to D/C. Pt received RW and 3-n-1 from [place] prior to D/C. Pt's HH PT and OT were set-up by CM prior to D/C. Pt D/C'd home via wheelchair @ 1155 per MD order. Pt is stable @ D/C and has no other needs at this time. Holli Humbles, RN

## 2016-04-05 NOTE — Telephone Encounter (Signed)
Pt was on TCM list admitted for surgery had (R) side Lumbar 4-5 Transformants interbody fusion w/intrumentation & allograft. Will f/u w/specialistin 2 wks...[name]

## 2016-04-08 DIAGNOSIS — M5416 Radiculopathy, lumbar region: Secondary | ICD-10-CM | POA: Diagnosis not present

## 2016-04-08 LAB — TYPE AND SCREEN
ABO/RH(D): B NEG
Antibody Screen: NEGATIVE
Unit division: 0
Unit division: 0

## 2016-04-10 NOTE — Discharge Summary (Signed)
Patient ID: [name] MRN: [id number] DOB/AGE: 1951/07/17 65 y.o.  Admit date: 04/03/2016 Discharge date: 04/05/2016  Admission Diagnoses:  Active Problems:   Radiculopathy   Discharge Diagnoses:  Same  Past Medical History  Diagnosis Date  . COMMON MIGRAINE   . DIVERTICULOSIS-COLON   . CORONARY ARTERY DISEASE   . Gastroparesis   . INSOMNIA-SLEEP DISORDER-UNSPEC   . Iron deficiency anemia   . Anxiety   . DEPRESSION   . Diabetes mellitus, type II ([place])   . GERD (gastroesophageal reflux disease)   . Hyperlipidemia   . Hypertension   . Gastric ulcer 04/2008  . CERVICAL RADICULOPATHY, LEFT   . Hiatal hernia   . Hypothyroidism   . Arthritis   . Wears glasses   . CKD (chronic kidney disease)     Surgeries: Procedure(s): RIGHT SIDED LUMBAR 4-5 TRANSFORAMINAL INTERBODY FUSION WITH INSTRUMENTATION AND ALLOGRAFT on 04/03/2016   Consultants:  None  Discharged Condition: Improved  Hospital Course: [name] is an 65 y.o. female who was admitted 04/03/2016 for operative treatment of radiculopathy. Patient has severe unremitting pain that affects sleep, daily activities, and work/hobbies. After pre-op clearance the patient was taken to the operating room on 04/03/2016 and underwent  Procedure(s): RIGHT SIDED LUMBAR 4-5 TRANSFORAMINAL INTERBODY FUSION WITH INSTRUMENTATION AND ALLOGRAFT.    Patient was given perioperative antibiotics:  Anti-infectives    Start     Dose/Rate Route Frequency Ordered Stop   04/04/16 0000  vancomycin (VANCOCIN) 1,500 mg in sodium chloride 0.9 % 500 mL IVPB     1,500 mg 250 mL/hr over 120 Minutes Intravenous  Once 04/03/16 2010 04/04/16 0312   04/03/16 2200  trimethoprim (TRIMPEX) tablet 100 mg  Status:  Discontinued     100 mg Oral Daily 04/03/16 1958 04/05/16 1502   04/03/16 0925  clindamycin (CLEOCIN) 900 MG/50ML IVPB    Comments:  Sammuel Cooper   : cabinet override      04/03/16 0925 04/03/16 1310   04/02/16 0909  clindamycin  (CLEOCIN) IVPB 900 mg  Status:  Discontinued     900 mg 100 mL/hr over 30 Minutes Intravenous On call to O.R. 04/02/16 0909 04/03/16 1958       Patient was given sequential compression devices, early ambulation to prevent DVT.  Patient benefited maximally from hospital stay and there were no complications.    Recent vital signs: BP 116/91 mmHg  Pulse 85  Temp(Src) 98.9 F (37.2 C) (Oral)  Resp 18  Wt 124.739 kg (275 lb)  SpO2 97%  Discharge Medications:     Medication List    STOP taking these medications        oxyCODONE 5 MG immediate release tablet  Commonly known as:  Oxy IR/ROXICODONE      TAKE these medications        AMBULATORY NON FORMULARY MEDICATION  Domperidone 10 mg    Take one tablet by mouth twice a day before meals     amitriptyline 10 MG tablet  Commonly known as:  ELAVIL  Take 10 mg by mouth at bedtime.     amLODipine-valsartan 10-320 MG tablet  Commonly known as:  EXFORGE  Take 1 tablet by mouth daily.     aspirin 81 MG EC tablet  Take 81 mg by mouth daily.     cloNIDine 0.1 MG tablet  Commonly known as:  CATAPRES  Take 1 tablet (0.1 mg total) by mouth 2 (two) times daily.     ELMIRON 100  MG capsule  Generic drug:  pentosan polysulfate  Take 100 mg by mouth 2 (two) times daily.     estradiol 2 MG tablet  Commonly known as:  ESTRACE  Take 1 tablet (2 mg total) by mouth daily.     FLUoxetine 20 MG capsule  Commonly known as:  PROZAC  Take 1 capsule (20 mg total) by mouth daily.     gabapentin 600 MG tablet  Commonly known as:  NEURONTIN  Take 600-1,200 mg by mouth 2 (two) times daily.     glimepiride 1 MG tablet  Commonly known as:  AMARYL  TAKE 1 TABLET BY MOUTH WITH SUPPER ONCE A DAY PER DR. Dwyane Dee     glucose blood test strip  Commonly known as:  ACCU-CHEK COMPACT PLUS  USE AS INSTRUCTED TO CHECK BLOOD SUGARS ONCE A DAY DX CODE E11.9     isosorbide dinitrate 10 MG tablet  Commonly known as:  ISORDIL  TAKE ONE TABLET BY MOUTH  DAILY     levothyroxine 88 MCG tablet  Commonly known as:  SYNTHROID, LEVOTHROID  TAKE 1 TABLET (88 MCG TOTAL) BY MOUTH DAILY.     linaclotide 145 MCG Caps capsule  Commonly known as:  LINZESS  Take 1 capsule (145 mcg total) by mouth every other day.     metoprolol 50 MG tablet  Commonly known as:  LOPRESSOR  Take one and one-half tablets twice a day     NOVOLOG FLEXPEN 100 UNIT/ML FlexPen  Generic drug:  insulin aspart  Inject 10 Units into the skin 3 (three) times daily with meals.     omeprazole 20 MG capsule  Commonly known as:  PRILOSEC  TAKE 1 CAPSULE (20 MG TOTAL) BY MOUTH DAILY.     ondansetron 4 MG tablet  Commonly known as:  ZOFRAN  Take 1 tab every 6 hours as needed for nausea.     simvastatin 20 MG tablet  Commonly known as:  ZOCOR  Take 1 tablet (20 mg total) by mouth daily.     sucralfate 1 GM/10ML suspension  Commonly known as:  CARAFATE  Take 2 teaspoons three times daily before meals.     trimethoprim 100 MG tablet  Commonly known as:  TRIMPEX  Take 100 mg by mouth daily.     VICTOZA 18 MG/3ML Sopn  Generic drug:  Liraglutide  Inject 0.2 mLs (1.2 mg total) into the skin daily. Inject once daily at the same time        Diagnostic Studies: Dg Chest 2 View  03/28/2016  CLINICAL DATA:  Preop for lumbar spine surgery, history of diabetes EXAM: CHEST  2 VIEW COMPARISON:  Chest x-ray of 06/13/2009 FINDINGS: No active infiltrate or effusion is seen. Mediastinal and hilar contours are unremarkable. The heart is within upper limits of normal. No bony abnormality is seen. IMPRESSION: No active lung disease.  Heart upper normal. Electronically Signed   By: Ivar Drape M.D.   On: 03/28/2016 17:02   Dg Lumbar Spine 2-3 Views  04/03/2016  CLINICAL DATA:  L4-5 fusion FLUOROSCOPY TIME:  30 seconds. Images: 2. EXAM: LUMBAR SPINE - 2-3 VIEW COMPARISON:  None. FINDINGS: L4-5 pedicles are now seen. A surgical sponges identified on AP imaging. IMPRESSION: L4-5 pedicle  screws.  A surgical sponge is seen on AP imaging. Electronically Signed   By: Dorise Bullion III M.D   On: 04/03/2016 17:17   Dg Lumbar Spine 2-3 Views  04/03/2016  CLINICAL DATA:  Transverse  interbody fusion  L4-5 EXAM: LUMBAR SPINE - 2-3 VIEW COMPARISON:  Lumbar MRI 06/14/2015 FINDINGS: Two images were obtained in the operating room of the lumbar spine Image number 1 reveals needles directed at the spinous processes of T11, L2, and L4 Image number 2 reveals surgical instruments at the level the inferior lamina of L4 directed toward the L4-5 disc space. Grade 1 anterior slip L4-5 IMPRESSION: L4-5 localized. Electronically Signed   By: Franchot Gallo M.D.   On: 04/03/2016 15:25   Dg C-arm 1-60 Min  04/03/2016  CLINICAL DATA:  L4-5 fusion FLUOROSCOPY TIME:  30 seconds. Images: 2 EXAM: DG C-ARM 61-120 MIN COMPARISON:  None. FINDINGS: Pedicle rods and screws have been placed at L4 and L5. Hardware is in good position on limited images. A surgical sponge is identified on the AP view. IMPRESSION: Pedicle rods and screws at L4-5.  Surgical sponge. Electronically Signed   By: Dorise Bullion III M.D   On: 04/03/2016 17:16    Disposition: 01-Home or Self Care   POD #2 s/p L4/5 procedure doing well  - up with PT/OT, encourage ambulation - Percocet for pain, Valium for muscle spasms -Written scripts for pain signed and in chart -D/C instructions sheet printed and in chart -D/C today  -F/U in office 2 weeks   Signed: Justice Britain 04/10/2016, 1:51 PM

## 2016-04-11 DIAGNOSIS — E119 Type 2 diabetes mellitus without complications: Secondary | ICD-10-CM | POA: Diagnosis not present

## 2016-04-11 DIAGNOSIS — I129 Hypertensive chronic kidney disease with stage 1 through stage 4 chronic kidney disease, or unspecified chronic kidney disease: Secondary | ICD-10-CM | POA: Diagnosis not present

## 2016-04-11 DIAGNOSIS — M4806 Spinal stenosis, lumbar region: Secondary | ICD-10-CM | POA: Diagnosis not present

## 2016-04-11 DIAGNOSIS — M1990 Unspecified osteoarthritis, unspecified site: Secondary | ICD-10-CM | POA: Diagnosis not present

## 2016-04-11 DIAGNOSIS — M4726 Other spondylosis with radiculopathy, lumbar region: Secondary | ICD-10-CM | POA: Diagnosis not present

## 2016-04-11 DIAGNOSIS — Z4789 Encounter for other orthopedic aftercare: Secondary | ICD-10-CM | POA: Diagnosis not present

## 2016-04-12 DIAGNOSIS — E119 Type 2 diabetes mellitus without complications: Secondary | ICD-10-CM | POA: Diagnosis not present

## 2016-04-12 DIAGNOSIS — M1990 Unspecified osteoarthritis, unspecified site: Secondary | ICD-10-CM | POA: Diagnosis not present

## 2016-04-12 DIAGNOSIS — Z4789 Encounter for other orthopedic aftercare: Secondary | ICD-10-CM | POA: Diagnosis not present

## 2016-04-12 DIAGNOSIS — M4806 Spinal stenosis, lumbar region: Secondary | ICD-10-CM | POA: Diagnosis not present

## 2016-04-12 DIAGNOSIS — M4726 Other spondylosis with radiculopathy, lumbar region: Secondary | ICD-10-CM | POA: Diagnosis not present

## 2016-04-12 DIAGNOSIS — I129 Hypertensive chronic kidney disease with stage 1 through stage 4 chronic kidney disease, or unspecified chronic kidney disease: Secondary | ICD-10-CM | POA: Diagnosis not present

## 2016-04-15 ENCOUNTER — Other Ambulatory Visit: Payer: Self-pay | Admitting: Endocrinology

## 2016-04-16 DIAGNOSIS — M1990 Unspecified osteoarthritis, unspecified site: Secondary | ICD-10-CM | POA: Diagnosis not present

## 2016-04-16 DIAGNOSIS — I129 Hypertensive chronic kidney disease with stage 1 through stage 4 chronic kidney disease, or unspecified chronic kidney disease: Secondary | ICD-10-CM | POA: Diagnosis not present

## 2016-04-16 DIAGNOSIS — M4726 Other spondylosis with radiculopathy, lumbar region: Secondary | ICD-10-CM | POA: Diagnosis not present

## 2016-04-16 DIAGNOSIS — E119 Type 2 diabetes mellitus without complications: Secondary | ICD-10-CM | POA: Diagnosis not present

## 2016-04-16 DIAGNOSIS — M4806 Spinal stenosis, lumbar region: Secondary | ICD-10-CM | POA: Diagnosis not present

## 2016-04-16 DIAGNOSIS — Z4789 Encounter for other orthopedic aftercare: Secondary | ICD-10-CM | POA: Diagnosis not present

## 2016-04-17 DIAGNOSIS — Z4789 Encounter for other orthopedic aftercare: Secondary | ICD-10-CM | POA: Diagnosis not present

## 2016-04-17 DIAGNOSIS — M1990 Unspecified osteoarthritis, unspecified site: Secondary | ICD-10-CM | POA: Diagnosis not present

## 2016-04-17 DIAGNOSIS — M4806 Spinal stenosis, lumbar region: Secondary | ICD-10-CM | POA: Diagnosis not present

## 2016-04-17 DIAGNOSIS — E119 Type 2 diabetes mellitus without complications: Secondary | ICD-10-CM | POA: Diagnosis not present

## 2016-04-17 DIAGNOSIS — M4726 Other spondylosis with radiculopathy, lumbar region: Secondary | ICD-10-CM | POA: Diagnosis not present

## 2016-04-17 DIAGNOSIS — I129 Hypertensive chronic kidney disease with stage 1 through stage 4 chronic kidney disease, or unspecified chronic kidney disease: Secondary | ICD-10-CM | POA: Diagnosis not present

## 2016-04-18 DIAGNOSIS — M4806 Spinal stenosis, lumbar region: Secondary | ICD-10-CM | POA: Diagnosis not present

## 2016-04-18 DIAGNOSIS — Z4789 Encounter for other orthopedic aftercare: Secondary | ICD-10-CM | POA: Diagnosis not present

## 2016-04-18 DIAGNOSIS — M4726 Other spondylosis with radiculopathy, lumbar region: Secondary | ICD-10-CM | POA: Diagnosis not present

## 2016-04-18 DIAGNOSIS — E119 Type 2 diabetes mellitus without complications: Secondary | ICD-10-CM | POA: Diagnosis not present

## 2016-04-18 DIAGNOSIS — M1990 Unspecified osteoarthritis, unspecified site: Secondary | ICD-10-CM | POA: Diagnosis not present

## 2016-04-18 DIAGNOSIS — I129 Hypertensive chronic kidney disease with stage 1 through stage 4 chronic kidney disease, or unspecified chronic kidney disease: Secondary | ICD-10-CM | POA: Diagnosis not present

## 2016-04-22 DIAGNOSIS — Z9889 Other specified postprocedural states: Secondary | ICD-10-CM | POA: Diagnosis not present

## 2016-04-23 DIAGNOSIS — M4726 Other spondylosis with radiculopathy, lumbar region: Secondary | ICD-10-CM | POA: Diagnosis not present

## 2016-04-23 DIAGNOSIS — M4806 Spinal stenosis, lumbar region: Secondary | ICD-10-CM | POA: Diagnosis not present

## 2016-04-23 DIAGNOSIS — I129 Hypertensive chronic kidney disease with stage 1 through stage 4 chronic kidney disease, or unspecified chronic kidney disease: Secondary | ICD-10-CM | POA: Diagnosis not present

## 2016-04-23 DIAGNOSIS — E119 Type 2 diabetes mellitus without complications: Secondary | ICD-10-CM | POA: Diagnosis not present

## 2016-04-23 DIAGNOSIS — Z4789 Encounter for other orthopedic aftercare: Secondary | ICD-10-CM | POA: Diagnosis not present

## 2016-04-23 DIAGNOSIS — M1990 Unspecified osteoarthritis, unspecified site: Secondary | ICD-10-CM | POA: Diagnosis not present

## 2016-04-24 DIAGNOSIS — E119 Type 2 diabetes mellitus without complications: Secondary | ICD-10-CM | POA: Diagnosis not present

## 2016-04-24 DIAGNOSIS — M1990 Unspecified osteoarthritis, unspecified site: Secondary | ICD-10-CM | POA: Diagnosis not present

## 2016-04-24 DIAGNOSIS — Z4789 Encounter for other orthopedic aftercare: Secondary | ICD-10-CM | POA: Diagnosis not present

## 2016-04-24 DIAGNOSIS — M4806 Spinal stenosis, lumbar region: Secondary | ICD-10-CM | POA: Diagnosis not present

## 2016-04-24 DIAGNOSIS — I129 Hypertensive chronic kidney disease with stage 1 through stage 4 chronic kidney disease, or unspecified chronic kidney disease: Secondary | ICD-10-CM | POA: Diagnosis not present

## 2016-04-24 DIAGNOSIS — M4726 Other spondylosis with radiculopathy, lumbar region: Secondary | ICD-10-CM | POA: Diagnosis not present

## 2016-04-25 ENCOUNTER — Other Ambulatory Visit: Payer: Self-pay | Admitting: *Deleted

## 2016-04-25 DIAGNOSIS — Z4789 Encounter for other orthopedic aftercare: Secondary | ICD-10-CM | POA: Diagnosis not present

## 2016-04-25 DIAGNOSIS — M4726 Other spondylosis with radiculopathy, lumbar region: Secondary | ICD-10-CM | POA: Diagnosis not present

## 2016-04-25 DIAGNOSIS — E119 Type 2 diabetes mellitus without complications: Secondary | ICD-10-CM | POA: Diagnosis not present

## 2016-04-25 DIAGNOSIS — M4806 Spinal stenosis, lumbar region: Secondary | ICD-10-CM | POA: Diagnosis not present

## 2016-04-25 DIAGNOSIS — I129 Hypertensive chronic kidney disease with stage 1 through stage 4 chronic kidney disease, or unspecified chronic kidney disease: Secondary | ICD-10-CM | POA: Diagnosis not present

## 2016-04-25 DIAGNOSIS — M1990 Unspecified osteoarthritis, unspecified site: Secondary | ICD-10-CM | POA: Diagnosis not present

## 2016-04-25 MED ORDER — GLUCOSE BLOOD VI STRP: ORAL_STRIP | Status: DC

## 2016-04-29 DIAGNOSIS — M4726 Other spondylosis with radiculopathy, lumbar region: Secondary | ICD-10-CM | POA: Diagnosis not present

## 2016-04-29 DIAGNOSIS — M4806 Spinal stenosis, lumbar region: Secondary | ICD-10-CM | POA: Diagnosis not present

## 2016-04-29 DIAGNOSIS — M1990 Unspecified osteoarthritis, unspecified site: Secondary | ICD-10-CM | POA: Diagnosis not present

## 2016-04-29 DIAGNOSIS — Z4789 Encounter for other orthopedic aftercare: Secondary | ICD-10-CM | POA: Diagnosis not present

## 2016-04-29 DIAGNOSIS — I129 Hypertensive chronic kidney disease with stage 1 through stage 4 chronic kidney disease, or unspecified chronic kidney disease: Secondary | ICD-10-CM | POA: Diagnosis not present

## 2016-04-29 DIAGNOSIS — E119 Type 2 diabetes mellitus without complications: Secondary | ICD-10-CM | POA: Diagnosis not present

## 2016-04-30 ENCOUNTER — Other Ambulatory Visit: Payer: Self-pay | Admitting: Endocrinology

## 2016-05-01 DIAGNOSIS — M4726 Other spondylosis with radiculopathy, lumbar region: Secondary | ICD-10-CM | POA: Diagnosis not present

## 2016-05-01 DIAGNOSIS — M4806 Spinal stenosis, lumbar region: Secondary | ICD-10-CM | POA: Diagnosis not present

## 2016-05-01 DIAGNOSIS — M1990 Unspecified osteoarthritis, unspecified site: Secondary | ICD-10-CM | POA: Diagnosis not present

## 2016-05-01 DIAGNOSIS — I129 Hypertensive chronic kidney disease with stage 1 through stage 4 chronic kidney disease, or unspecified chronic kidney disease: Secondary | ICD-10-CM | POA: Diagnosis not present

## 2016-05-01 DIAGNOSIS — E119 Type 2 diabetes mellitus without complications: Secondary | ICD-10-CM | POA: Diagnosis not present

## 2016-05-01 DIAGNOSIS — Z4789 Encounter for other orthopedic aftercare: Secondary | ICD-10-CM | POA: Diagnosis not present

## 2016-05-14 ENCOUNTER — Telehealth: Payer: Self-pay | Admitting: Endocrinology

## 2016-05-14 ENCOUNTER — Other Ambulatory Visit: Payer: Medicare Other

## 2016-05-14 NOTE — Telephone Encounter (Signed)
Mardene Celeste, Could you please provide the documentation for this note? Thanks!

## 2016-05-14 NOTE — Telephone Encounter (Deleted)
Patient no showed today's appt. Please advise on how to follow up. °A. No follow up necessary. °B. Follow up urgent. Contact patient immediately. °C. Follow up necessary. Contact patient and schedule visit in ___ days. °D. Follow up advised. Contact patient and schedule visit in ____weeks. ° °

## 2016-05-17 ENCOUNTER — Ambulatory Visit: Payer: Medicare Other | Admitting: Endocrinology

## 2016-05-20 DIAGNOSIS — M5416 Radiculopathy, lumbar region: Secondary | ICD-10-CM | POA: Diagnosis not present

## 2016-05-22 ENCOUNTER — Ambulatory Visit: Payer: Medicare Other | Admitting: Endocrinology

## 2016-05-22 DIAGNOSIS — Z0289 Encounter for other administrative examinations: Secondary | ICD-10-CM

## 2016-05-26 ENCOUNTER — Other Ambulatory Visit: Payer: Self-pay | Admitting: Internal Medicine

## 2016-06-16 ENCOUNTER — Other Ambulatory Visit: Payer: Self-pay | Admitting: Endocrinology

## 2016-06-17 ENCOUNTER — Other Ambulatory Visit: Payer: Self-pay | Admitting: Endocrinology

## 2016-06-20 DIAGNOSIS — M1990 Unspecified osteoarthritis, unspecified site: Secondary | ICD-10-CM | POA: Diagnosis not present

## 2016-06-20 DIAGNOSIS — M5416 Radiculopathy, lumbar region: Secondary | ICD-10-CM | POA: Diagnosis not present

## 2016-06-20 DIAGNOSIS — I2510 Atherosclerotic heart disease of native coronary artery without angina pectoris: Secondary | ICD-10-CM | POA: Diagnosis not present

## 2016-06-20 DIAGNOSIS — E119 Type 2 diabetes mellitus without complications: Secondary | ICD-10-CM | POA: Diagnosis not present

## 2016-06-20 DIAGNOSIS — Z4789 Encounter for other orthopedic aftercare: Secondary | ICD-10-CM | POA: Diagnosis not present

## 2016-06-20 DIAGNOSIS — I129 Hypertensive chronic kidney disease with stage 1 through stage 4 chronic kidney disease, or unspecified chronic kidney disease: Secondary | ICD-10-CM | POA: Diagnosis not present

## 2016-06-25 DIAGNOSIS — M1990 Unspecified osteoarthritis, unspecified site: Secondary | ICD-10-CM | POA: Diagnosis not present

## 2016-06-25 DIAGNOSIS — I129 Hypertensive chronic kidney disease with stage 1 through stage 4 chronic kidney disease, or unspecified chronic kidney disease: Secondary | ICD-10-CM | POA: Diagnosis not present

## 2016-06-25 DIAGNOSIS — I2510 Atherosclerotic heart disease of native coronary artery without angina pectoris: Secondary | ICD-10-CM | POA: Diagnosis not present

## 2016-06-25 DIAGNOSIS — Z4789 Encounter for other orthopedic aftercare: Secondary | ICD-10-CM | POA: Diagnosis not present

## 2016-06-25 DIAGNOSIS — M5416 Radiculopathy, lumbar region: Secondary | ICD-10-CM | POA: Diagnosis not present

## 2016-06-25 DIAGNOSIS — E119 Type 2 diabetes mellitus without complications: Secondary | ICD-10-CM | POA: Diagnosis not present

## 2016-06-27 DIAGNOSIS — M1990 Unspecified osteoarthritis, unspecified site: Secondary | ICD-10-CM | POA: Diagnosis not present

## 2016-06-27 DIAGNOSIS — M5416 Radiculopathy, lumbar region: Secondary | ICD-10-CM | POA: Diagnosis not present

## 2016-06-27 DIAGNOSIS — I129 Hypertensive chronic kidney disease with stage 1 through stage 4 chronic kidney disease, or unspecified chronic kidney disease: Secondary | ICD-10-CM | POA: Diagnosis not present

## 2016-06-27 DIAGNOSIS — E119 Type 2 diabetes mellitus without complications: Secondary | ICD-10-CM | POA: Diagnosis not present

## 2016-06-27 DIAGNOSIS — I2510 Atherosclerotic heart disease of native coronary artery without angina pectoris: Secondary | ICD-10-CM | POA: Diagnosis not present

## 2016-06-27 DIAGNOSIS — Z4789 Encounter for other orthopedic aftercare: Secondary | ICD-10-CM | POA: Diagnosis not present

## 2016-07-02 DIAGNOSIS — M1611 Unilateral primary osteoarthritis, right hip: Secondary | ICD-10-CM | POA: Diagnosis not present

## 2016-07-02 DIAGNOSIS — M25551 Pain in right hip: Secondary | ICD-10-CM | POA: Diagnosis not present

## 2016-07-09 DIAGNOSIS — Z23 Encounter for immunization: Secondary | ICD-10-CM | POA: Diagnosis not present

## 2016-07-09 DIAGNOSIS — N183 Chronic kidney disease, stage 3 (moderate): Secondary | ICD-10-CM | POA: Diagnosis not present

## 2016-07-09 DIAGNOSIS — I10 Essential (primary) hypertension: Secondary | ICD-10-CM | POA: Diagnosis not present

## 2016-07-10 DIAGNOSIS — M1611 Unilateral primary osteoarthritis, right hip: Secondary | ICD-10-CM | POA: Diagnosis not present

## 2016-07-10 DIAGNOSIS — M5416 Radiculopathy, lumbar region: Secondary | ICD-10-CM | POA: Diagnosis not present

## 2016-07-10 DIAGNOSIS — I129 Hypertensive chronic kidney disease with stage 1 through stage 4 chronic kidney disease, or unspecified chronic kidney disease: Secondary | ICD-10-CM | POA: Diagnosis not present

## 2016-07-10 DIAGNOSIS — M1990 Unspecified osteoarthritis, unspecified site: Secondary | ICD-10-CM | POA: Diagnosis not present

## 2016-07-10 DIAGNOSIS — Z4789 Encounter for other orthopedic aftercare: Secondary | ICD-10-CM | POA: Diagnosis not present

## 2016-07-10 DIAGNOSIS — E119 Type 2 diabetes mellitus without complications: Secondary | ICD-10-CM | POA: Diagnosis not present

## 2016-07-10 DIAGNOSIS — I2510 Atherosclerotic heart disease of native coronary artery without angina pectoris: Secondary | ICD-10-CM | POA: Diagnosis not present

## 2016-07-11 DIAGNOSIS — M1990 Unspecified osteoarthritis, unspecified site: Secondary | ICD-10-CM | POA: Diagnosis not present

## 2016-07-11 DIAGNOSIS — Z4789 Encounter for other orthopedic aftercare: Secondary | ICD-10-CM | POA: Diagnosis not present

## 2016-07-11 DIAGNOSIS — M5416 Radiculopathy, lumbar region: Secondary | ICD-10-CM | POA: Diagnosis not present

## 2016-07-11 DIAGNOSIS — E119 Type 2 diabetes mellitus without complications: Secondary | ICD-10-CM | POA: Diagnosis not present

## 2016-07-11 DIAGNOSIS — I2510 Atherosclerotic heart disease of native coronary artery without angina pectoris: Secondary | ICD-10-CM | POA: Diagnosis not present

## 2016-07-11 DIAGNOSIS — I129 Hypertensive chronic kidney disease with stage 1 through stage 4 chronic kidney disease, or unspecified chronic kidney disease: Secondary | ICD-10-CM | POA: Diagnosis not present

## 2016-07-15 DIAGNOSIS — M5416 Radiculopathy, lumbar region: Secondary | ICD-10-CM | POA: Diagnosis not present

## 2016-07-15 DIAGNOSIS — E119 Type 2 diabetes mellitus without complications: Secondary | ICD-10-CM | POA: Diagnosis not present

## 2016-07-15 DIAGNOSIS — Z4789 Encounter for other orthopedic aftercare: Secondary | ICD-10-CM | POA: Diagnosis not present

## 2016-07-15 DIAGNOSIS — I129 Hypertensive chronic kidney disease with stage 1 through stage 4 chronic kidney disease, or unspecified chronic kidney disease: Secondary | ICD-10-CM | POA: Diagnosis not present

## 2016-07-15 DIAGNOSIS — M545 Low back pain: Secondary | ICD-10-CM | POA: Diagnosis not present

## 2016-07-15 DIAGNOSIS — M1990 Unspecified osteoarthritis, unspecified site: Secondary | ICD-10-CM | POA: Diagnosis not present

## 2016-07-15 DIAGNOSIS — I2510 Atherosclerotic heart disease of native coronary artery without angina pectoris: Secondary | ICD-10-CM | POA: Diagnosis not present

## 2016-07-16 DIAGNOSIS — M1611 Unilateral primary osteoarthritis, right hip: Secondary | ICD-10-CM | POA: Diagnosis not present

## 2016-07-18 DIAGNOSIS — M545 Low back pain: Secondary | ICD-10-CM | POA: Diagnosis not present

## 2016-07-18 DIAGNOSIS — M4326 Fusion of spine, lumbar region: Secondary | ICD-10-CM | POA: Diagnosis not present

## 2016-07-21 ENCOUNTER — Other Ambulatory Visit: Payer: Self-pay | Admitting: Internal Medicine

## 2016-07-22 DIAGNOSIS — M545 Low back pain: Secondary | ICD-10-CM | POA: Diagnosis not present

## 2016-07-22 DIAGNOSIS — M4326 Fusion of spine, lumbar region: Secondary | ICD-10-CM | POA: Diagnosis not present

## 2016-07-23 DIAGNOSIS — N183 Chronic kidney disease, stage 3 (moderate): Secondary | ICD-10-CM | POA: Diagnosis not present

## 2016-07-24 DIAGNOSIS — M4326 Fusion of spine, lumbar region: Secondary | ICD-10-CM | POA: Diagnosis not present

## 2016-07-24 DIAGNOSIS — M545 Low back pain: Secondary | ICD-10-CM | POA: Diagnosis not present

## 2016-07-29 DIAGNOSIS — M4326 Fusion of spine, lumbar region: Secondary | ICD-10-CM | POA: Diagnosis not present

## 2016-07-29 DIAGNOSIS — M545 Low back pain: Secondary | ICD-10-CM | POA: Diagnosis not present

## 2016-08-03 ENCOUNTER — Other Ambulatory Visit: Payer: Self-pay | Admitting: Endocrinology

## 2016-08-03 ENCOUNTER — Other Ambulatory Visit: Payer: Self-pay | Admitting: Gynecology

## 2016-08-05 DIAGNOSIS — M545 Low back pain: Secondary | ICD-10-CM | POA: Diagnosis not present

## 2016-08-05 DIAGNOSIS — M4326 Fusion of spine, lumbar region: Secondary | ICD-10-CM | POA: Diagnosis not present

## 2016-08-07 DIAGNOSIS — M545 Low back pain: Secondary | ICD-10-CM | POA: Diagnosis not present

## 2016-08-07 DIAGNOSIS — M4326 Fusion of spine, lumbar region: Secondary | ICD-10-CM | POA: Diagnosis not present

## 2016-08-08 ENCOUNTER — Telehealth: Payer: Self-pay | Admitting: Endocrinology

## 2016-08-08 MED ORDER — GLUCOSE BLOOD VI STRP
ORAL_STRIP | 2 refills | Status: DC
Start: 2016-08-08 — End: 2016-08-20

## 2016-08-08 NOTE — Telephone Encounter (Signed)
Refill submitted per patient's request.  

## 2016-08-08 NOTE — Telephone Encounter (Signed)
Accu test guide she need the test strip, send to  [place], [place] - [address] [phone] (Phone) [phone] (Fax)

## 2016-08-12 ENCOUNTER — Ambulatory Visit (INDEPENDENT_AMBULATORY_CARE_PROVIDER_SITE_OTHER): Payer: Medicare Other | Admitting: Podiatry

## 2016-08-12 ENCOUNTER — Ambulatory Visit (INDEPENDENT_AMBULATORY_CARE_PROVIDER_SITE_OTHER): Payer: Medicare Other

## 2016-08-12 ENCOUNTER — Encounter: Payer: Self-pay | Admitting: Podiatry

## 2016-08-12 VITALS — BP 118/64 | HR 79 | Resp 16

## 2016-08-12 DIAGNOSIS — M79675 Pain in left toe(s): Secondary | ICD-10-CM

## 2016-08-12 DIAGNOSIS — M205X2 Other deformities of toe(s) (acquired), left foot: Secondary | ICD-10-CM | POA: Diagnosis not present

## 2016-08-12 DIAGNOSIS — L600 Ingrowing nail: Secondary | ICD-10-CM

## 2016-08-12 DIAGNOSIS — M2022 Hallux rigidus, left foot: Secondary | ICD-10-CM

## 2016-08-12 NOTE — Patient Instructions (Signed)

## 2016-08-12 NOTE — Progress Notes (Signed)
Subjective:     Patient ID: [name], female   DOB: 05-Oct-1951, 65 y.o.   MRN: [id number]  HPI patient complains about the big toenail on her left foot and the second nail on the left foot stating that they both are sore and making it hard to wear shoe gear comfortably. Also concerned about her big toe joint left which she states get tender at different times   Review of Systems     Objective:   Physical Exam Neurovascular status was intact with good digital flow noted. She has had some elevation of her A1c recently due to steroid injections in her back but typically runs around 7 as far as A1c goes and she's not had trouble with healing. She has thick incurvated nailbeds left hallux and second left that are painful when pressed and makes shoe gear difficult and mild restriction of motion of the first MPJ left foot.    Assessment:     Damaged hallux and second nail left with pain along with hallux limitus deformity    Plan:     H&P condition reviewed and do not recommend treatment as the joint is functioning reasonably well as far as hallux limitus goes. I do think the nails are ingrown and incurvated and I've recommended removal and explained procedure and risk and risk of healing with diabetes. She is willing to accept risk wants surgery and today I infiltrated the left hallux and second toes with 120 mg I can Marcaine mixture remove the hallux nail secondary exposed matrix and applied phenol 5 applications 30 seconds followed by alcohol lavage and sterile dressing. Gave instructions on soaks and reappoint

## 2016-08-13 ENCOUNTER — Ambulatory Visit (INDEPENDENT_AMBULATORY_CARE_PROVIDER_SITE_OTHER): Payer: Medicare Other | Admitting: Endocrinology

## 2016-08-13 ENCOUNTER — Encounter: Payer: Self-pay | Admitting: Endocrinology

## 2016-08-13 VITALS — BP 138/80 | HR 92 | Ht 65.0 in | Wt 269.0 lb

## 2016-08-13 DIAGNOSIS — M4326 Fusion of spine, lumbar region: Secondary | ICD-10-CM | POA: Diagnosis not present

## 2016-08-13 DIAGNOSIS — N183 Chronic kidney disease, stage 3 unspecified: Secondary | ICD-10-CM

## 2016-08-13 DIAGNOSIS — Z794 Long term (current) use of insulin: Secondary | ICD-10-CM | POA: Diagnosis not present

## 2016-08-13 DIAGNOSIS — E1165 Type 2 diabetes mellitus with hyperglycemia: Secondary | ICD-10-CM

## 2016-08-13 DIAGNOSIS — I10 Essential (primary) hypertension: Secondary | ICD-10-CM

## 2016-08-13 DIAGNOSIS — M545 Low back pain: Secondary | ICD-10-CM | POA: Diagnosis not present

## 2016-08-13 NOTE — Patient Instructions (Addendum)
Check blood sugars on waking up  3x weekly  Also check blood sugars about 2 hours after a meal and do this after different meals by rotation  Recommended blood sugar levels on waking up is 90-130 and about 2 hours after meal is 130-160  Please bring your blood sugar monitor to each visit, thank you  Lantus 10 units daily

## 2016-08-13 NOTE — Progress Notes (Signed)
[name] is a 65 y.o. female.    Reason for Appointment: Diabetes follow-up   History of Present Illness   Diagnosis: Type 2 DIABETES MELITUS, date of diagnosis: 2000  Previous history: She had initially been treated with metformin and Amaryl and subsequently given Victoza which helped overall control and weight loss. In 9/13 because of significant hyperglycemia she was started on basal bolus insulin also. Had required relatively small doses of insulin. Her blood sugar control has been excellent with A1c between 5.3-6.0 although this may be relatively higher than expected. Tends to have relatively higher readings after supper In 6/15 she was told to resume her Victoza and stop her Lantus since she had been gaining weight with Lantus and NovoLog regimen.   Recent history:     INSULIN: Novolog 10 units twice a day at 2pm, 7 PM   Non-insulin hypoglycemic drugs: Amaryl 1 mg acs , Victoza 1.2 mg daily    She has not been followed for several months now A1c not done recently  Current blood sugar patterns and management:  Her metformin was stopped, probably by nephrologist when her renal function was worse, not clear what her recent renal function has been  Overall blood sugars are significantly high, averaging 227 at home  She has variable meal times and her first meal is not until 1 or 2 PM  However she is checking most of her blood sugars around bedtime  She has usually significant high blood sugars at bedtime although last night 2 hours after dinner hour sugar 133 with usual amount of carbohydrate  Fasting blood sugars are appearing mostly high with only one good reading about 3 weeks ago  Not able to exercise because of various issues and continued back problem  However has lost weight since her last visit    Side effects from medications:  none  Monitors blood glucose: Once a day or less on average .    Glucometer:  Accu-Chek compact.          Blood Glucose  readings from monitor download   Mean values apply above for all meters except median for One Touch  PRE-MEAL Fasting Lunch Dinner Bedtime Overall  Glucose range: 107-297    133-303    Mean/median:    250  227    Hypoglycemia: None    Meals: 1-2 meals per day. Supper at 7 PM pm           Physical activity: exercise: Limited, has had fatigue, back and leg pain.           Dietician visit: Most recent: 06/2425           Complications: are: None  Wt Readings from Last 3 Encounters:  08/13/16 269 lb (122 kg)  04/03/16 275 lb (124.7 kg)  03/28/16 275 lb 1.6 oz (124.8 kg)    LABS:  Lab Results  Component Value Date   HGBA1C 6.4 02/12/2016   HGBA1C 5.6 11/01/2015   HGBA1C 6.7 (H) 06/27/2015   Lab Results  Component Value Date   MICROALBUR <0.7 06/27/2015   LDLCALC 103 (H) 02/12/2016   CREATININE 1.49 (H) 04/03/2016    No visits with results within 1 Week(s) from this visit.  Latest known visit with results is:  Admission on 04/03/2016, Discharged on 04/05/2016  Component Date Value Ref Range Status  . Sodium 04/03/2016 137  135 - 145 mmol/L Final  . Potassium 04/03/2016 4.6  3.5 - 5.1 mmol/L Final  .  Chloride 04/03/2016 109  101 - 111 mmol/L Final  . CO2 04/03/2016 19* 22 - 32 mmol/L Final  . Glucose, Bld 04/03/2016 175* 65 - 99 mg/dL Final  . BUN 04/03/2016 20  6 - 20 mg/dL Final  . Creatinine, Ser 04/03/2016 1.49* 0.44 - 1.00 mg/dL Final  . Calcium 04/03/2016 9.6  8.9 - 10.3 mg/dL Final  . GFR calc non Af Amer 04/03/2016 35* >60 mL/min Final  . GFR calc Af Amer 04/03/2016 41* >60 mL/min Final   Comment: (NOTE) The eGFR has been calculated using the CKD EPI equation. This calculation has not been validated in all clinical situations. eGFR's persistently <60 mL/min signify possible Chronic Kidney Disease.   . Anion gap 04/03/2016 9  5 - 15 Final  . Order Confirmation 04/03/2016 ORDER PROCESSED BY BLOOD BANK   Final  . Glucose-Capillary 04/03/2016 193* 65 - 99 mg/dL  Final  . Glucose-Capillary 04/03/2016 150* 65 - 99 mg/dL Final  . Glucose-Capillary 04/03/2016 176* 65 - 99 mg/dL Final  . Glucose-Capillary 04/03/2016 221* 65 - 99 mg/dL Final  . Comment 1 04/03/2016 Notify RN   Final  . Comment 2 04/03/2016 Document in Chart   Final  . Glucose-Capillary 04/04/2016 221* 65 - 99 mg/dL Final  . Comment 1 04/04/2016 Notify RN   Final  . Comment 2 04/04/2016 Document in Chart   Final  . Glucose-Capillary 04/04/2016 168* 65 - 99 mg/dL Final  . Glucose-Capillary 04/04/2016 50* 65 - 99 mg/dL Final  . Glucose-Capillary 04/04/2016 71  65 - 99 mg/dL Final  . Glucose-Capillary 04/04/2016 140* 65 - 99 mg/dL Final  . Comment 1 04/04/2016 Notify RN   Final  . Comment 2 04/04/2016 Document in Chart   Final  . Glucose-Capillary 04/05/2016 175* 65 - 99 mg/dL Final      Medication List       Accurate as of 08/13/16 11:59 PM. Always use your most recent med list.          AMBULATORY NON FORMULARY MEDICATION Domperidone 10 mg    Take one tablet by mouth twice a day before meals   amitriptyline 10 MG tablet Commonly known as:  ELAVIL Take 10 mg by mouth at bedtime.   amLODipine-valsartan 10-320 MG tablet Commonly known as:  EXFORGE Take 1 tablet by mouth daily.   amLODipine-valsartan 10-320 MG tablet Commonly known as:  EXFORGE TAKE 1 TABLET BY MOUTH EVERY DAY   aspirin 81 MG EC tablet Take 81 mg by mouth daily.   cloNIDine 0.1 MG tablet Commonly known as:  CATAPRES Take 1 tablet (0.1 mg total) by mouth 2 (two) times daily.   ELMIRON 100 MG capsule Generic drug:  pentosan polysulfate Take 100 mg by mouth 2 (two) times daily.   estradiol 2 MG tablet Commonly known as:  ESTRACE TAKE 1 TABLET (2 MG TOTAL) BY MOUTH DAILY.   FLUoxetine 20 MG capsule Commonly known as:  PROZAC TAKE 1 CAPSULE (20 MG TOTAL) BY MOUTH DAILY.   gabapentin 600 MG tablet Commonly known as:  NEURONTIN Take 600-1,200 mg by mouth 2 (two) times daily.   glimepiride 1 MG  tablet Commonly known as:  AMARYL TAKE 1 TABLET BY MOUTH WITH SUPPER ONCE A DAY PER DR. Dwyane Dee   glucose blood test strip Commonly known as:  ACCU-CHEK COMPACT PLUS Use as instructed to check sugar once daily Dx: E11.9   isosorbide dinitrate 10 MG tablet Commonly known as:  ISORDIL TAKE ONE TABLET BY MOUTH DAILY  levothyroxine 88 MCG tablet Commonly known as:  SYNTHROID, LEVOTHROID TAKE 1 TABLET BY MOUTH EVERY DAY   linaclotide 145 MCG Caps capsule Commonly known as:  LINZESS Take 1 capsule (145 mcg total) by mouth every other day.   metFORMIN 500 MG 24 hr tablet Commonly known as:  GLUCOPHAGE-XR TAKE 1 TABLET (500 MG TOTAL) BY MOUTH 2 (TWO) TIMES DAILY.   metoprolol 50 MG tablet Commonly known as:  LOPRESSOR Take one and one-half tablets twice a day   NOVOLOG FLEXPEN 100 UNIT/ML FlexPen Generic drug:  insulin aspart Inject 10 Units into the skin 3 (three) times daily with meals.   omeprazole 20 MG capsule Commonly known as:  PRILOSEC TAKE 1 CAPSULE (20 MG TOTAL) BY MOUTH DAILY.   ondansetron 4 MG tablet Commonly known as:  ZOFRAN Take 1 tab every 6 hours as needed for nausea.   simvastatin 20 MG tablet Commonly known as:  ZOCOR TAKE 1 TABLET (20 MG TOTAL) BY MOUTH DAILY.   sucralfate 1 GM/10ML suspension Commonly known as:  CARAFATE Take 2 teaspoons three times daily before meals.   trimethoprim 100 MG tablet Commonly known as:  TRIMPEX Take 100 mg by mouth daily.   VICTOZA 18 MG/3ML Sopn Generic drug:  liraglutide Inject 0.2 mLs (1.2 mg total) into the skin daily. Inject once daily at the same time       Allergies:  Allergies  Allergen Reactions  . Hydrocodone Other (See Comments)    tachycardia  . Ciprofloxacin Other (See Comments)    dizzy  . Penicillins Hives    Has patient had a PCN reaction causing immediate rash, facial/tongue/throat swelling, SOB or lightheadedness with hypotension: Yes Has patient had a PCN reaction causing severe rash  involving mucus membranes or skin necrosis: No Has patient had a PCN reaction that required hospitalization No Has patient had a PCN reaction occurring within the last 10 years: Yes If all of the above answers are "NO", then may proceed with Cephalosporin use.     Past Medical History:  Diagnosis Date  . Anxiety   . Arthritis   . CERVICAL RADICULOPATHY, LEFT   . CKD (chronic kidney disease)   . COMMON MIGRAINE   . CORONARY ARTERY DISEASE   . DEPRESSION   . Diabetes mellitus, type II ([place])   . DIVERTICULOSIS-COLON   . Gastric ulcer 04/2008  . Gastroparesis   . GERD (gastroesophageal reflux disease)   . Hiatal hernia   . Hyperlipidemia   . Hypertension   . Hypothyroidism   . INSOMNIA-SLEEP DISORDER-UNSPEC   . Iron deficiency anemia   . Wears glasses     Past Surgical History:  Procedure Laterality Date  . ABDOMINAL HYSTERECTOMY    . CHOLECYSTECTOMY  06/2009  . COLONOSCOPY    . ESOPHAGOGASTRODUODENOSCOPY    . EYE SURGERY Bilateral    lasik  . Gastric Wedge resection lipoma  11/2007   x2 with laparotomy and gastrostomy  . Left knee replacement    . Rigth knee replacement with revision  04/2008   Dr. Berenice Primas  . ROTATOR CUFF REPAIR Left 01/2009  . s/p bladder surgury  09/2009   Dr. Terance Hart    Family History  Problem Relation Age of Onset  . Diabetes Sister     x 3  . Heart disease Sister     x2  . Diabetes Brother     x3  . Heart disease Brother     x2  . Coronary artery disease Other  female 1st degree  . Hypertension Other   . Colon cancer Neg Hx     Social History:  reports that she has quit smoking. She has never used smokeless tobacco. She reports that she does not drink alcohol or use drugs.  Review of Systems:  Hypertension: Her blood pressure is treated with metoprolol, clonidine Twice a day and Exforge Not monitoring blood pressure at home but she does have a monitor  She has been given diuretics by her nephrologist  Mild CKD: Her creatinine is   persistently High and she apparently has had no specific diagnosis made by the nephrologist Has been seen recently but no labs available  Lab Results  Component Value Date   CREATININE 1.49 (H) 04/03/2016   CREATININE 1.93 (H) 03/28/2016   CREATININE 1.30 (H) 02/12/2016   CREATININE 1.95 (H) 11/01/2015     Lipids: Followed by PCP, previously well controlled with simvastatin   Lab Results  Component Value Date   CHOL 212 (H) 02/12/2016   HDL 86.20 02/12/2016   LDLCALC 103 (H) 02/12/2016   LDLDIRECT 156.6 01/07/2011   TRIG 113.0 02/12/2016   CHOLHDL 2 02/12/2016     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     ASSESSMENT/ PLAN:   Diabetes type 2, uncontrolled :   See history of present illness for detailed discussion of his current management, blood sugar patterns and problems identified He continues to be on a regimen of Victoza, mealtime insulin and Amaryl  However her blood sugars are overall higher more recently as judged by her home readings which are averaging over 200 Most of her readings are done late at night Recently fasting readings are also relatively high although she does not check them when she wakes up Appears to be more insulin deficient  Recommendations made:  Monitor blood sugars on waking up and 2 hours after both her meals by  rotation not just at bedtime  Start Lantus insulin with 10 units and gradually increased the dose if fasting blood sugars continue to be over 150  She will call if she has difficulties with adjustment  If her blood sugars are continued to be high after meals she will increase her mealtime insulin also  Increase exercise as tolerated  Check A1c today  Continue 1.2 mg Victoza  Recheck renal function and if creatinine clearance is at least 40 may retry low-dose of metformin  Consider consultation with dietitian  Follow-up in 6 weeks for short-term evaluation and encouraged her to be regular with her follow-up  Hypothyroidism: TSH will be rechecked today  HYPERTENSION: Continue same regimen   Patient Instructions  Check blood sugars on waking up  3x weekly  Also check blood sugars about 2 hours after a meal and do this after different meals by rotation  Recommended blood sugar levels on waking up is 90-130 and about 2 hours after meal is 130-160  Please bring your blood sugar monitor to each visit, thank you  Lantus 10 units daily     Counseling time on subjects discussed above is over 50% of today's 25 minute visit   Irvine Glorioso 08/14/2016, 8:12 AM   Note: This office note was prepared with Dragon voice recognition system technology. Any transcriptional errors that result from this process are unintentional.

## 2016-08-14 ENCOUNTER — Telehealth: Payer: Self-pay | Admitting: *Deleted

## 2016-08-14 DIAGNOSIS — M4326 Fusion of spine, lumbar region: Secondary | ICD-10-CM | POA: Diagnosis not present

## 2016-08-14 DIAGNOSIS — M545 Low back pain: Secondary | ICD-10-CM | POA: Diagnosis not present

## 2016-08-14 NOTE — Telephone Encounter (Signed)
Pt states her toe is still painful. I spoke with pt, informed that she would have discomfort, drainage, redness and possibly even swelling to varying degrees for 4-6 weeks after the procedure. I asked pt if she could take OTC ibuprofen and she states she is not able to take Ibuprofen or Tylenol due to kidney problems. I gave her instructions for 1/2C epsom salt and 1Qt warm water soaks for 4-6 weeks twice daily for 20 minutes each session and cover with a light coated Neosporin bandaid, until the area started to have dry hard scabs, then can go to soaking once daily and cover with antibiotic ointment bandaid when up walking around or in shoes otherwise could allow to air dry, and once the area had a dry hard scab without redness, drainage, swelling or pain she could stop the soaks and dressings. Pt states understanding.

## 2016-08-15 ENCOUNTER — Other Ambulatory Visit: Payer: Self-pay

## 2016-08-15 DIAGNOSIS — E119 Type 2 diabetes mellitus without complications: Secondary | ICD-10-CM

## 2016-08-15 MED ORDER — GLUCOSE BLOOD VI STRP: ORAL_STRIP | 12 refills | Status: DC

## 2016-08-15 MED ORDER — VICTOZA 18 MG/3ML ~~LOC~~ SOPN: 1.2000 mg | PEN_INJECTOR | Freq: Every day | SUBCUTANEOUS | 3 refills | Status: DC

## 2016-08-15 NOTE — Telephone Encounter (Signed)
Patient stated the wrong test strips for meter was sent to pharmacy she has Accu check guide test strip.

## 2016-08-15 NOTE — Telephone Encounter (Signed)
SENT 

## 2016-08-19 DIAGNOSIS — M4326 Fusion of spine, lumbar region: Secondary | ICD-10-CM | POA: Diagnosis not present

## 2016-08-19 DIAGNOSIS — M545 Low back pain: Secondary | ICD-10-CM | POA: Diagnosis not present

## 2016-08-20 ENCOUNTER — Other Ambulatory Visit: Payer: Self-pay

## 2016-08-20 MED ORDER — GLUCOSE BLOOD VI STRP: ORAL_STRIP | 12 refills | Status: DC

## 2016-08-20 MED ORDER — ACCU-CHEK AVIVA PLUS W/DEVICE KIT: 1.0000 | PACK | Freq: Once | 0 refills | Status: AC

## 2016-08-20 MED ORDER — GLUCOSE BLOOD VI STRP
ORAL_STRIP | 12 refills | Status: DC
Start: 2016-08-20 — End: 2016-08-21

## 2016-08-20 NOTE — Telephone Encounter (Signed)
Patient husband called pharmacy stated they needed the dosage (how many strips) Accu chek guide before they can pick up the medication.   [place], [place] - [address] [phone] (Phone) [phone] (Fax)

## 2016-08-20 NOTE — Telephone Encounter (Signed)
Had to order new meter and strips I spoke to patient and explained what I did and why and she stated an understanding

## 2016-08-21 ENCOUNTER — Other Ambulatory Visit: Payer: Self-pay

## 2016-08-21 MED ORDER — GLUCOSE BLOOD VI STRP: ORAL_STRIP | 12 refills | Status: DC

## 2016-08-26 DIAGNOSIS — M4326 Fusion of spine, lumbar region: Secondary | ICD-10-CM | POA: Diagnosis not present

## 2016-08-26 DIAGNOSIS — M545 Low back pain: Secondary | ICD-10-CM | POA: Diagnosis not present

## 2016-09-04 ENCOUNTER — Other Ambulatory Visit: Payer: Self-pay | Admitting: Gynecology

## 2016-09-13 ENCOUNTER — Telehealth: Payer: Self-pay

## 2016-09-13 DIAGNOSIS — M545 Low back pain: Secondary | ICD-10-CM | POA: Diagnosis not present

## 2016-09-13 NOTE — Telephone Encounter (Signed)
Called patient and she has not had an eye exam this year. Thanks.

## 2016-09-13 NOTE — Telephone Encounter (Signed)
Pt has not this far had an eye exam.   Can you contact pt and inquire if an eye exam was done this year. If so, where?

## 2016-09-20 NOTE — Telephone Encounter (Signed)
Contacted [place] but the office was closed.   [phone]

## 2016-09-23 DIAGNOSIS — M545 Low back pain: Secondary | ICD-10-CM | POA: Diagnosis not present

## 2016-09-23 DIAGNOSIS — M4326 Fusion of spine, lumbar region: Secondary | ICD-10-CM | POA: Diagnosis not present

## 2016-09-24 DIAGNOSIS — M47816 Spondylosis without myelopathy or radiculopathy, lumbar region: Secondary | ICD-10-CM | POA: Diagnosis not present

## 2016-10-01 DIAGNOSIS — M4326 Fusion of spine, lumbar region: Secondary | ICD-10-CM | POA: Diagnosis not present

## 2016-10-01 DIAGNOSIS — M545 Low back pain: Secondary | ICD-10-CM | POA: Diagnosis not present

## 2016-10-02 DIAGNOSIS — R350 Frequency of micturition: Secondary | ICD-10-CM | POA: Diagnosis not present

## 2016-10-02 DIAGNOSIS — N3010 Interstitial cystitis (chronic) without hematuria: Secondary | ICD-10-CM | POA: Diagnosis not present

## 2016-10-07 ENCOUNTER — Other Ambulatory Visit: Payer: Self-pay | Admitting: Endocrinology

## 2016-10-10 ENCOUNTER — Other Ambulatory Visit: Payer: Self-pay | Admitting: Internal Medicine

## 2016-10-16 ENCOUNTER — Other Ambulatory Visit: Payer: Self-pay | Admitting: Endocrinology

## 2016-10-16 ENCOUNTER — Telehealth: Payer: Self-pay | Admitting: Gastroenterology

## 2016-10-16 NOTE — Telephone Encounter (Signed)
Left message for patient to call back  

## 2016-11-05 ENCOUNTER — Encounter: Payer: Self-pay | Admitting: Internal Medicine

## 2016-11-05 ENCOUNTER — Encounter (HOSPITAL_COMMUNITY): Payer: Self-pay

## 2016-11-05 ENCOUNTER — Ambulatory Visit (HOSPITAL_COMMUNITY)
Admission: RE | Admit: 2016-11-05 | Discharge: 2016-11-05 | Disposition: A | Discharge status: Home / Self Care | Payer: Medicare Other | Source: Ambulatory Visit | Attending: Cardiovascular Disease | Admitting: Cardiovascular Disease

## 2016-11-05 ENCOUNTER — Ambulatory Visit (INDEPENDENT_AMBULATORY_CARE_PROVIDER_SITE_OTHER): Payer: Medicare Other | Admitting: Internal Medicine

## 2016-11-05 ENCOUNTER — Other Ambulatory Visit: Payer: Self-pay | Admitting: Internal Medicine

## 2016-11-05 DIAGNOSIS — E785 Hyperlipidemia, unspecified: Secondary | ICD-10-CM | POA: Diagnosis not present

## 2016-11-05 DIAGNOSIS — R52 Pain, unspecified: Secondary | ICD-10-CM

## 2016-11-05 DIAGNOSIS — I10 Essential (primary) hypertension: Secondary | ICD-10-CM

## 2016-11-05 DIAGNOSIS — Z87891 Personal history of nicotine dependence: Secondary | ICD-10-CM | POA: Diagnosis not present

## 2016-11-05 DIAGNOSIS — M79604 Pain in right leg: Secondary | ICD-10-CM

## 2016-11-05 DIAGNOSIS — R609 Edema, unspecified: Secondary | ICD-10-CM

## 2016-11-05 DIAGNOSIS — I2510 Atherosclerotic heart disease of native coronary artery without angina pectoris: Secondary | ICD-10-CM | POA: Diagnosis not present

## 2016-11-05 DIAGNOSIS — E119 Type 2 diabetes mellitus without complications: Secondary | ICD-10-CM | POA: Diagnosis not present

## 2016-11-05 DIAGNOSIS — M7989 Other specified soft tissue disorders: Secondary | ICD-10-CM | POA: Insufficient documentation

## 2016-11-05 NOTE — Progress Notes (Signed)
Subjective:    Patient ID: [name], female    DOB: 1951-06-05, 66 y.o.   MRN: [id number]  HPI Here with 2 wks onset right leg pain and swelling worse to the upper leg, but involves the distal leg and foot as well so that she can barely get her shoe on. Leg feels heavy and changes her gait.  Worst pain is upper leg/thigh but not really worse with walkiing like knee pain or hip per pt.  No recent falls or trauam.  No skin erythema or other change.  Pt denies fever, wt loss, night sweats, loss of appetite, or other constitutional symptoms. Pt denies chest pain, increased sob or doe, wheezing, orthopnea, PND,  palpitations, dizziness or syncope.   Pt denies polydipsia, polyuria,   Past Medical History:  Diagnosis Date  . Anxiety   . Arthritis   . CERVICAL RADICULOPATHY, LEFT   . CKD (chronic kidney disease)   . COMMON MIGRAINE   . CORONARY ARTERY DISEASE   . DEPRESSION   . Diabetes mellitus, type II ([place])   . DIVERTICULOSIS-COLON   . Gastric ulcer 04/2008  . Gastroparesis   . GERD (gastroesophageal reflux disease)   . Hiatal hernia   . Hyperlipidemia   . Hypertension   . Hypothyroidism   . INSOMNIA-SLEEP DISORDER-UNSPEC   . Iron deficiency anemia   . Wears glasses    Past Surgical History:  Procedure Laterality Date  . ABDOMINAL HYSTERECTOMY    . CHOLECYSTECTOMY  06/2009  . COLONOSCOPY    . ESOPHAGOGASTRODUODENOSCOPY    . EYE SURGERY Bilateral    lasik  . Gastric Wedge resection lipoma  11/2007   x2 with laparotomy and gastrostomy  . Left knee replacement    . Rigth knee replacement with revision  04/2008   Dr. Berenice Primas  . ROTATOR CUFF REPAIR Left 01/2009  . s/p bladder surgury  09/2009   Dr. Terance Hart    reports that she has quit smoking. She has never used smokeless tobacco. She reports that she does not drink alcohol or use drugs. family history includes Coronary artery disease in her other; Diabetes in her brother and sister; Heart disease in her brother and sister;  Hypertension in her other. Allergies  Allergen Reactions  . Hydrocodone Other (See Comments)    tachycardia  . Ciprofloxacin Other (See Comments)    dizzy  . Penicillins Hives    Has patient had a PCN reaction causing immediate rash, facial/tongue/throat swelling, SOB or lightheadedness with hypotension: Yes Has patient had a PCN reaction causing severe rash involving mucus membranes or skin necrosis: No Has patient had a PCN reaction that required hospitalization No Has patient had a PCN reaction occurring within the last 10 years: Yes If all of the above answers are "NO", then may proceed with Cephalosporin use.    Current Outpatient Prescriptions on File Prior to Visit  Medication Sig Dispense Refill  . AMBULATORY NON FORMULARY MEDICATION Domperidone 10 mg    Take one tablet by mouth twice a day before meals 120 tablet 3  . amitriptyline (ELAVIL) 10 MG tablet Take 10 mg by mouth at bedtime.     Marland Kitchen amLODipine-valsartan (EXFORGE) 10-320 MG tablet Take 1 tablet by mouth daily. 90 tablet 1  . amLODipine-valsartan (EXFORGE) 10-320 MG tablet TAKE 1 TABLET BY MOUTH EVERY DAY 90 tablet 1  . aspirin 81 MG EC tablet Take 81 mg by mouth daily.      . cloNIDine (CATAPRES) 0.1 MG tablet TAKE  1 TABLET (0.1 MG TOTAL) BY MOUTH 2 (TWO) TIMES DAILY. 60 tablet 2  . ELMIRON 100 MG capsule Take 100 mg by mouth 2 (two) times daily.     Marland Kitchen estradiol (ESTRACE) 2 MG tablet TAKE 1 TABLET (2 MG TOTAL) BY MOUTH DAILY. 30 tablet 0  . FLUoxetine (PROZAC) 20 MG capsule TAKE 1 CAPSULE (20 MG TOTAL) BY MOUTH DAILY. 90 capsule 1  . gabapentin (NEURONTIN) 600 MG tablet Take 600-1,200 mg by mouth 2 (two) times daily.   1  . glimepiride (AMARYL) 1 MG tablet TAKE 1 TABLET BY MOUTH WITH SUPPER ONCE A DAY PER DR. Dwyane Dee 30 tablet 2  . glucose blood (ACCU-CHEK AVIVA PLUS) test strip Use to test blood sugar 3 times daily- Dx code E11.9 100 each 12  . insulin aspart (NOVOLOG FLEXPEN) 100 UNIT/ML FlexPen Inject 10 Units into the  skin 3 (three) times daily with meals.    . isosorbide dinitrate (ISORDIL) 10 MG tablet TAKE ONE TABLET BY MOUTH DAILY 90 tablet 1  . levothyroxine (SYNTHROID, LEVOTHROID) 88 MCG tablet TAKE 1 TABLET (88 MCG TOTAL) BY MOUTH DAILY. 30 tablet 2  . Linaclotide (LINZESS) 145 MCG CAPS capsule Take 1 capsule (145 mcg total) by mouth every other day. 15 capsule 5  . metFORMIN (GLUCOPHAGE-XR) 500 MG 24 hr tablet TAKE 1 TABLET (500 MG TOTAL) BY MOUTH 2 (TWO) TIMES DAILY. 60 tablet 2  . metoprolol (LOPRESSOR) 50 MG tablet Take one and one-half tablets twice a day (Patient taking differently: Take 50 mg by mouth at bedtime. ) 90 tablet 11  . omeprazole (PRILOSEC) 20 MG capsule TAKE 1 CAPSULE (20 MG TOTAL) BY MOUTH DAILY. 30 capsule 0  . ondansetron (ZOFRAN) 4 MG tablet TAKE 1 TABLET BY MOUTH EVERY 6 HOURS AS NEEDED FOR NAUSEA 30 tablet 0  . simvastatin (ZOCOR) 20 MG tablet TAKE 1 TABLET (20 MG TOTAL) BY MOUTH DAILY. 90 tablet 1  . sucralfate (CARAFATE) 1 GM/10ML suspension Take 2 teaspoons three times daily before meals. (Patient taking differently: Take 1 g by mouth. Take 2 teaspoons three times daily before meals.) 5400 mL 5  . trimethoprim (TRIMPEX) 100 MG tablet Take 100 mg by mouth daily.   1  . VICTOZA 18 MG/3ML SOPN Inject 0.2 mLs (1.2 mg total) into the skin daily. Inject once daily at the same time 2 pen 3   No current facility-administered medications on file prior to visit.     Review of Systems  Constitutional: Negative for unusual diaphoresis or night sweats HENT: Negative for ear swelling or discharge Eyes: Negative for worsening visual haziness  Respiratory: Negative for choking and stridor.   Gastrointestinal: Negative for distension or worsening eructation Genitourinary: Negative for retention or change in urine volume.  Musculoskeletal: Negative for other MSK pain or swelling Skin: Negative for color change and worsening wound Neurological: Negative for tremors and numbness other than  noted  Psychiatric/Behavioral: Negative for decreased concentration or agitation other than above   All other system neg per pt    Objective:   Physical Exam BP 140/80   Pulse (!) 102   Temp 98.2 F (36.8 C) (Oral)   Resp 18   Wt 284 lb (128.8 kg)   SpO2 98%   BMI 47.26 kg/m  VS noted,  Constitutional: Pt appears in no apparent distress HENT: Head: NCAT.  Right Ear: External ear normal.  Left Ear: External ear normal.  Eyes: . Pupils are equal, round, and reactive to light. Conjunctivae and  EOM are normal Neck: Normal range of motion. Neck supple.  Cardiovascular: Normal rate and regular rhythm.   Pulmonary/Chest: Effort normal and breath sounds without rales or wheezing.  RLE neurovasc intact but has rather dramatic whole leg swelling to hip without specific knee effusion, with tenderness noted to right anterior mid and lower thigh, Neurological: Pt is alert. Not confused , motor grossly intact Skin: Skin is warm. No rash, no LE edema Psychiatric: Pt behavior is normal. No agitation.  No other new exam findings    Assessment & Plan:

## 2016-11-05 NOTE — Assessment & Plan Note (Signed)
New onset x 2 wks, diff includes DVT, lymphedema or hip or knee involvement, for LE Venous doppler asap

## 2016-11-05 NOTE — Patient Instructions (Signed)
You will be contacted regarding the referral for: right leg venous doppler ultrasound  Please continue all other medications as before, and refills have been done if requested.  Please have the pharmacy call with any other refills you may need.  Please continue your efforts at being more active, low cholesterol diet, and weight control.  Please keep your appointments with your specialists as you may have planned

## 2016-11-05 NOTE — Progress Notes (Signed)
Today's right lower extremity venous duplex is negative for DVT. Preliminary results given to Corrine.

## 2016-11-05 NOTE — Assessment & Plan Note (Signed)
stable overall by history and exam, recent data reviewed with pt, and pt to continue medical treatment as before,  to f/u any worsening symptoms or concerns BP Readings from Last 3 Encounters:  11/05/16 140/80  08/13/16 138/80  08/12/16 118/64

## 2016-11-05 NOTE — Progress Notes (Signed)
Pre visit review using our clinic review tool, if applicable. No additional management support is needed unless otherwise documented below in the visit note. 

## 2016-11-05 NOTE — Addendum Note (Signed)
Addended by: Biagio Borg on: 11/05/2016 03:18 PM   Modules accepted: Orders

## 2016-11-05 NOTE — Assessment & Plan Note (Signed)
stable overall by history and exam, recent data reviewed with pt, and pt to continue medical treatment as before,  to f/u any worsening symptoms or concerns Lab Results  Component Value Date   HGBA1C 6.4 02/12/2016

## 2016-11-06 ENCOUNTER — Telehealth: Payer: Self-pay | Admitting: Internal Medicine

## 2016-11-06 NOTE — Telephone Encounter (Signed)
Error

## 2016-11-11 ENCOUNTER — Other Ambulatory Visit (INDEPENDENT_AMBULATORY_CARE_PROVIDER_SITE_OTHER): Payer: Medicare Other

## 2016-11-11 DIAGNOSIS — Z794 Long term (current) use of insulin: Secondary | ICD-10-CM

## 2016-11-11 DIAGNOSIS — E1165 Type 2 diabetes mellitus with hyperglycemia: Secondary | ICD-10-CM

## 2016-11-11 DIAGNOSIS — E890 Postprocedural hypothyroidism: Secondary | ICD-10-CM

## 2016-11-11 LAB — COMPREHENSIVE METABOLIC PANEL
ALT: 16 U/L (ref 0–35)
AST: 14 U/L (ref 0–37)
Albumin: 3.7 g/dL (ref 3.5–5.2)
Alkaline Phosphatase: 81 U/L (ref 39–117)
BUN: 28 mg/dL — ABNORMAL HIGH (ref 6–23)
CO2: 21 mEq/L (ref 19–32)
Calcium: 9 mg/dL (ref 8.4–10.5)
Chloride: 109 mEq/L (ref 96–112)
Creatinine, Ser: 1.67 mg/dL — ABNORMAL HIGH (ref 0.40–1.20)
GFR: 39.39 mL/min — ABNORMAL LOW (ref >60.00)
Glucose, Bld: 265 mg/dL — ABNORMAL HIGH (ref 70–99)
Potassium: 4.7 mEq/L (ref 3.5–5.1)
Sodium: 137 mEq/L (ref 135–145)
Total Bilirubin: 0.6 mg/dL (ref 0.2–1.2)
Total Protein: 6.8 g/dL (ref 6.0–8.3)

## 2016-11-11 LAB — MICROALBUMIN / CREATININE URINE RATIO
Creatinine,U: 223.2 mg/dL
Microalb Creat Ratio: 0.9 mg/g (ref 0.0–30.0)
Microalb, Ur: 2.1 mg/dL — ABNORMAL HIGH (ref 0.0–1.9)

## 2016-11-11 LAB — T4, FREE: Free T4: 0.82 ng/dL (ref 0.60–1.60)

## 2016-11-11 LAB — HEMOGLOBIN A1C: Hgb A1c MFr Bld: 6.9 % — ABNORMAL HIGH (ref 4.6–6.5)

## 2016-11-11 LAB — TSH: TSH: 1.33 u[IU]/mL (ref 0.35–4.50)

## 2016-11-13 ENCOUNTER — Encounter: Payer: Self-pay | Admitting: Endocrinology

## 2016-11-13 ENCOUNTER — Ambulatory Visit (INDEPENDENT_AMBULATORY_CARE_PROVIDER_SITE_OTHER): Payer: Medicare Other | Admitting: Endocrinology

## 2016-11-13 VITALS — BP 130/80 | HR 107 | Wt 272.0 lb

## 2016-11-13 DIAGNOSIS — E1165 Type 2 diabetes mellitus with hyperglycemia: Secondary | ICD-10-CM | POA: Diagnosis not present

## 2016-11-13 DIAGNOSIS — E782 Mixed hyperlipidemia: Secondary | ICD-10-CM

## 2016-11-13 DIAGNOSIS — E890 Postprocedural hypothyroidism: Secondary | ICD-10-CM

## 2016-11-13 DIAGNOSIS — Z794 Long term (current) use of insulin: Secondary | ICD-10-CM

## 2016-11-13 DIAGNOSIS — I10 Essential (primary) hypertension: Secondary | ICD-10-CM

## 2016-11-13 MED ORDER — GLUCOSE BLOOD VI STRP: ORAL_STRIP | 12 refills | Status: DC

## 2016-11-13 NOTE — Patient Instructions (Signed)
Lantus  insulin: This insulin provides blood sugar control for up to 24 hours.   Start with 14 units at bedtime daily and increase by 2 units every 3 days until the waking up sugars are under 130. Then continue the same dose.  If blood sugar is under 90 for 2 days in a row, reduce the dose by 2 units.  Note that this insulin does not control the rise of blood sugar with meals    May need more Novolog also after am sugar down

## 2016-11-13 NOTE — Progress Notes (Signed)
[name] is a 66 y.o. female.    Reason for Appointment: Diabetes follow-up   History of Present Illness   Diagnosis: Type 2 DIABETES MELITUS, date of diagnosis: 2000  Previous history: She had initially been treated with metformin and Amaryl and subsequently given Victoza which helped overall control and weight loss. In 9/13 because of significant hyperglycemia she was started on basal bolus insulin also. Had required relatively small doses of insulin. Her blood sugar control has been excellent with A1c between 5.3-6.0 although this may be relatively higher than expected. Tends to have relatively higher readings after supper In 6/15 she was told to resume her Victoza and stop her Lantus since she had been gaining weight with Lantus and NovoLog regimen.   Recent history:     INSULIN: Novolog 10 units twice a day at 2pm, 7 PM; Lantus 10 units   Non-insulin hypoglycemic drugs: Amaryl 1 mg acs , Victoza 1.2 mg daily    She has not been followed up as advised for her 6 weeks follow-up after starting her insulin A1c is lower than expected for her home blood sugar average of 230, now 6.9  Current blood sugar patterns and management:  Her blood sugars are not improving compared to her last visit and A1c is going up further  She has consistently high fasting readings and has not changed her Lantus insulin since starting this, does not know how to titrate this.  She thinks her blood sugars are higher because of having more sciatica pain  Overall blood sugars are significantly high and also late in the evening when she sometimes checks her readings, highest 312  She has variable meal times and her first meal is not until 1 or 2 PM.  Still not able to lose weight  Not able to exercise because of various issues and continued back problem  She has had some increased thirst with her high blood sugars but avoiding drinks and [place]    Side effects from medications:   none  Monitors blood glucose: Once a day or less on average .    Glucometer:  Accu-Chek compact.          Blood Glucose readings from monitor download   Mean values apply above for all meters except median for One Touch  PRE-MEAL Fasting Lunch Dinner Bedtime Overall  Glucose range: 176-375   190-278    Mean/median: 220    226  230    Hypoglycemia: None    Meals: 1-2 meals per day. Supper at 7 PM pm           Physical activity: exercise: Limited, has had fatigue, back and leg pain.           Dietician visit: Most recent: 11/4399           Complications: are: None  Wt Readings from Last 3 Encounters:  11/13/16 272 lb (123.4 kg)  11/05/16 284 lb (128.8 kg)  08/13/16 269 lb (122 kg)    LABS:  Lab Results  Component Value Date   HGBA1C 6.9 (H) 11/11/2016   HGBA1C 6.4 02/12/2016   HGBA1C 5.6 11/01/2015   Lab Results  Component Value Date   MICROALBUR 2.1 (H) 11/11/2016   LDLCALC 103 (H) 02/12/2016   CREATININE 1.67 (H) 11/11/2016   Other active problems: See review of systems   Appointment on 11/11/2016  Component Date Value Ref Range Status  . Hgb A1c MFr Bld 11/11/2016 6.9* 4.6 - 6.5 %  Final  . Sodium 11/11/2016 137  135 - 145 mEq/L Final  . Potassium 11/11/2016 4.7  3.5 - 5.1 mEq/L Final  . Chloride 11/11/2016 109  96 - 112 mEq/L Final  . CO2 11/11/2016 21  19 - 32 mEq/L Final  . Glucose, Bld 11/11/2016 265* 70 - 99 mg/dL Final  . BUN 11/11/2016 28* 6 - 23 mg/dL Final  . Creatinine, Ser 11/11/2016 1.67* 0.40 - 1.20 mg/dL Final  . Total Bilirubin 11/11/2016 0.6  0.2 - 1.2 mg/dL Final  . Alkaline Phosphatase 11/11/2016 81  39 - 117 U/L Final  . AST 11/11/2016 14  0 - 37 U/L Final  . ALT 11/11/2016 16  0 - 35 U/L Final  . Total Protein 11/11/2016 6.8  6.0 - 8.3 g/dL Final  . Albumin 11/11/2016 3.7  3.5 - 5.2 g/dL Final  . Calcium 11/11/2016 9.0  8.4 - 10.5 mg/dL Final  . GFR 11/11/2016 39.39* >60.00 mL/min Final  . TSH 11/11/2016 1.33  0.35 - 4.50 uIU/mL Final  .  Free T4 11/11/2016 0.82  0.60 - 1.60 ng/dL Final   Comment: Specimens from patients who are undergoing biotin therapy and /or ingesting biotin supplements may contain high levels of biotin.  The higher biotin concentration in these specimens interferes with this Free T4 assay.  Specimens that contain high levels  of biotin may cause false high results for this Free T4 assay.  Please interpret results in light of the total clinical presentation of the patient.    Jacquelyne Balint, Ur 11/11/2016 2.1* 0.0 - 1.9 mg/dL Final  . Creatinine,U 11/11/2016 223.2  mg/dL Final  . Microalb Creat Ratio 11/11/2016 0.9  0.0 - 30.0 mg/g Final    Allergies as of 11/13/2016      Reactions   Hydrocodone Other (See Comments)   tachycardia   Ciprofloxacin Other (See Comments)   dizzy   Penicillins Hives   Has patient had a PCN reaction causing immediate rash, facial/tongue/throat swelling, SOB or lightheadedness with hypotension: Yes Has patient had a PCN reaction causing severe rash involving mucus membranes or skin necrosis: No Has patient had a PCN reaction that required hospitalization No Has patient had a PCN reaction occurring within the last 10 years: Yes If all of the above answers are "NO", then may proceed with Cephalosporin use.      Medication List       Accurate as of 11/13/16 11:59 PM. Always use your most recent med list.          AMBULATORY NON FORMULARY MEDICATION Domperidone 10 mg    Take one tablet by mouth twice a day before meals   amitriptyline 10 MG tablet Commonly known as:  ELAVIL Take 10 mg by mouth at bedtime.   amLODipine-valsartan 10-320 MG tablet Commonly known as:  EXFORGE Take 1 tablet by mouth daily.   amLODipine-valsartan 10-320 MG tablet Commonly known as:  EXFORGE TAKE 1 TABLET BY MOUTH EVERY DAY   aspirin 81 MG EC tablet Take 81 mg by mouth daily.   cloNIDine 0.1 MG tablet Commonly known as:  CATAPRES TAKE 1 TABLET (0.1 MG TOTAL) BY MOUTH 2 (TWO) TIMES DAILY.    ELMIRON 100 MG capsule Generic drug:  pentosan polysulfate Take 100 mg by mouth 2 (two) times daily.   estradiol 2 MG tablet Commonly known as:  ESTRACE TAKE 1 TABLET (2 MG TOTAL) BY MOUTH DAILY.   FLUoxetine 20 MG capsule Commonly known as:  PROZAC TAKE 1 CAPSULE (20 MG  TOTAL) BY MOUTH DAILY.   gabapentin 600 MG tablet Commonly known as:  NEURONTIN Take 600-1,200 mg by mouth 2 (two) times daily.   glimepiride 1 MG tablet Commonly known as:  AMARYL TAKE 1 TABLET BY MOUTH WITH SUPPER ONCE A DAY PER DR. Dwyane Dee   glucose blood test strip Commonly known as:  ACCU-CHEK AVIVA PLUS Use to test blood sugar 3 times daily- Dx code E11.9   isosorbide dinitrate 10 MG tablet Commonly known as:  ISORDIL TAKE ONE TABLET BY MOUTH DAILY   levothyroxine 88 MCG tablet Commonly known as:  SYNTHROID, LEVOTHROID TAKE 1 TABLET (88 MCG TOTAL) BY MOUTH DAILY.   linaclotide 145 MCG Caps capsule Commonly known as:  LINZESS Take 1 capsule (145 mcg total) by mouth every other day.   metFORMIN 500 MG 24 hr tablet Commonly known as:  GLUCOPHAGE-XR TAKE 1 TABLET (500 MG TOTAL) BY MOUTH 2 (TWO) TIMES DAILY.   metoprolol 50 MG tablet Commonly known as:  LOPRESSOR Take one and one-half tablets twice a day   NOVOLOG FLEXPEN 100 UNIT/ML FlexPen Generic drug:  insulin aspart Inject 10 Units into the skin 3 (three) times daily with meals.   omeprazole 20 MG capsule Commonly known as:  PRILOSEC TAKE 1 CAPSULE (20 MG TOTAL) BY MOUTH DAILY.   ondansetron 4 MG tablet Commonly known as:  ZOFRAN TAKE 1 TABLET BY MOUTH EVERY 6 HOURS AS NEEDED FOR NAUSEA   simvastatin 20 MG tablet Commonly known as:  ZOCOR TAKE 1 TABLET (20 MG TOTAL) BY MOUTH DAILY.   sucralfate 1 GM/10ML suspension Commonly known as:  CARAFATE Take 2 teaspoons three times daily before meals.   trimethoprim 100 MG tablet Commonly known as:  TRIMPEX Take 100 mg by mouth daily.   VICTOZA 18 MG/3ML Sopn Generic drug:   liraglutide Inject 0.2 mLs (1.2 mg total) into the skin daily. Inject once daily at the same time       Allergies:  Allergies  Allergen Reactions  . Hydrocodone Other (See Comments)    tachycardia  . Ciprofloxacin Other (See Comments)    dizzy  . Penicillins Hives    Has patient had a PCN reaction causing immediate rash, facial/tongue/throat swelling, SOB or lightheadedness with hypotension: Yes Has patient had a PCN reaction causing severe rash involving mucus membranes or skin necrosis: No Has patient had a PCN reaction that required hospitalization No Has patient had a PCN reaction occurring within the last 10 years: Yes If all of the above answers are "NO", then may proceed with Cephalosporin use.     Past Medical History:  Diagnosis Date  . Anxiety   . Arthritis   . CERVICAL RADICULOPATHY, LEFT   . CKD (chronic kidney disease)   . COMMON MIGRAINE   . CORONARY ARTERY DISEASE   . DEPRESSION   . Diabetes mellitus, type II ([place])   . DIVERTICULOSIS-COLON   . Gastric ulcer 04/2008  . Gastroparesis   . GERD (gastroesophageal reflux disease)   . Hiatal hernia   . Hyperlipidemia   . Hypertension   . Hypothyroidism   . INSOMNIA-SLEEP DISORDER-UNSPEC   . Iron deficiency anemia   . Wears glasses     Past Surgical History:  Procedure Laterality Date  . ABDOMINAL HYSTERECTOMY    . CHOLECYSTECTOMY  06/2009  . COLONOSCOPY    . ESOPHAGOGASTRODUODENOSCOPY    . EYE SURGERY Bilateral    lasik  . Gastric Wedge resection lipoma  11/2007   x2 with laparotomy and gastrostomy  .  Left knee replacement    . Rigth knee replacement with revision  04/2008   Dr. Berenice Primas  . ROTATOR CUFF REPAIR Left 01/2009  . s/p bladder surgury  09/2009   Dr. Terance Hart    Family History  Problem Relation Age of Onset  . Diabetes Sister     x 3  . Heart disease Sister     x2  . Diabetes Brother     x3  . Heart disease Brother     x2  . Coronary artery disease Other     female 1st degree  .  Hypertension Other   . Colon cancer Neg Hx     Social History:  reports that she has quit smoking. She has never used smokeless tobacco. She reports that she does not drink alcohol or use drugs.  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 and she apparently has had no specific diagnosis made by the nephrologist   Lab Results  Component Value Date   CREATININE 1.67 (H) 11/11/2016   CREATININE 1.49 (H) 04/03/2016   CREATININE 1.93 (H) 03/28/2016   CREATININE 1.30 (H) 02/12/2016     Lipids: Followed by PCP, LDL has been inconsistently controlled, only on 20 mg Zocor   Lab Results  Component Value Date   CHOL 212 (H) 02/12/2016   HDL 86.20 02/12/2016   LDLCALC 103 (H) 02/12/2016   LDLDIRECT 156.6 01/07/2011   TRIG 113.0 02/12/2016   CHOLHDL 2 02/12/2016     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   No ankle edema present  ASSESSMENT/ PLAN:   Diabetes type 2, uncontrolled :   See history of present illness for detailed discussion of his current management, blood sugar patterns and problems identified  Blood sugars are consistently high at home, averaging well over 200 A1c is falsely low As discussed above she is not getting enough insulin and she does not know how to titrate the dose Also not clear if she is  getting enough mealtime coverage with higher readings after supper consistently She is on 1.2 mg Victoza, not using 1.8 because of cost   Recommendations made:  Monitor blood sugars on waking up and 2 hours after either one of her meals by rotation  Start titrating Lantus insulin with 14 units starting tonight and using the flow sheet that was explained to her with adjustment of dose every 3 days by 2 units to get morning sugars below 130  If readings after evening meal are higher she can increase her NovoLog also  She will call if she has difficulties with adjustment  Follow-up in 6 weeks for short-term evaluation and encouraged her to be regular with her follow-up  Hypothyroidism: TSH will be  rechecked every 6 months  HYPERTENSION: Controlled, Continue same regimen  Lipids: The LDL was previously high, recommend switching Zocor to another starting to avoid interaction with amlodipine   Patient Instructions  Lantus  insulin: This insulin provides blood sugar control for up to 24 hours.   Start with 14 units at bedtime daily and increase by 2 units every 3 days until the waking up sugars are under 130. Then continue the same dose.  If blood sugar is under 90 for 2 days in a row, reduce the dose by 2 units.  Note that this insulin does not control the rise of blood sugar with meals    May need more Novolog also after am sugar down   Counseling time on subjects discussed above is over 50% of today's 25 minute visit   Kenlie Seki 11/14/2016, 7:58 AM   Note: This office note was prepared with Dragon voice recognition system technology. Any transcriptional errors that result from this process are unintentional.

## 2016-11-18 DIAGNOSIS — M1990 Unspecified osteoarthritis, unspecified site: Secondary | ICD-10-CM | POA: Diagnosis not present

## 2016-11-18 DIAGNOSIS — G894 Chronic pain syndrome: Secondary | ICD-10-CM | POA: Diagnosis not present

## 2016-11-18 DIAGNOSIS — I129 Hypertensive chronic kidney disease with stage 1 through stage 4 chronic kidney disease, or unspecified chronic kidney disease: Secondary | ICD-10-CM | POA: Diagnosis not present

## 2016-11-18 DIAGNOSIS — I2510 Atherosclerotic heart disease of native coronary artery without angina pectoris: Secondary | ICD-10-CM | POA: Diagnosis not present

## 2016-11-18 DIAGNOSIS — E119 Type 2 diabetes mellitus without complications: Secondary | ICD-10-CM | POA: Diagnosis not present

## 2016-11-18 DIAGNOSIS — N189 Chronic kidney disease, unspecified: Secondary | ICD-10-CM | POA: Diagnosis not present

## 2016-11-18 DIAGNOSIS — E785 Hyperlipidemia, unspecified: Secondary | ICD-10-CM | POA: Diagnosis not present

## 2016-11-18 NOTE — Progress Notes (Signed)
[name] [address] [place], [place] [address] Phone: [phone] Subjective:    I'm seeing this patient by the request  of:  Cathlean Cower, MD   CC: Right leg pain  HEN:[id number]  [name] is a 66 y.o. female coming in with complaint of right leg pain. Past medical history shows that within the last 8 months patient did have a right-sided lumbar L4-L5 transforaminal interbody fusion Patient states in full and the last 4 weeks patient has had more of a right leg pain with swelling of the upper leg. States that it seems to radiate towards the distal leg and foot. Sometimes as significant amount swelling is unable to put her shoes on. Has also she has to changed how she's been walking. Worse with activity. Does not remember any true falls or any trauma to the area. Denies any fevers chills or any abnormal weight loss. Denies any recent shortness of breath or chest pain.  patient did see her primary care provider and was sent for a Doppler was negative for deep venous thrombosis. Rates the severity of pain as 9 out of 10.  Past Medical History:  Diagnosis Date  . Anxiety   . Arthritis   . CERVICAL RADICULOPATHY, LEFT   . CKD (chronic kidney disease)   . COMMON MIGRAINE   . CORONARY ARTERY DISEASE   . DEPRESSION   . Diabetes mellitus, type II ([place])   . DIVERTICULOSIS-COLON   . Gastric ulcer 04/2008  . Gastroparesis   . GERD (gastroesophageal reflux disease)   . Hiatal hernia   . Hyperlipidemia   . Hypertension   . Hypothyroidism   . INSOMNIA-SLEEP DISORDER-UNSPEC   . Iron deficiency anemia   . Wears glasses    Past Surgical History:  Procedure Laterality Date  . ABDOMINAL HYSTERECTOMY    . CHOLECYSTECTOMY  06/2009  . COLONOSCOPY    . ESOPHAGOGASTRODUODENOSCOPY    . EYE SURGERY Bilateral    lasik  . Gastric Wedge resection lipoma  11/2007   x2 with laparotomy and gastrostomy  . Left knee replacement    . Rigth knee replacement with  revision  04/2008   Dr. Berenice Primas  . ROTATOR CUFF REPAIR Left 01/2009  . s/p bladder surgury  09/2009   Dr. Terance Hart   Social History   Social History  . Marital status: Married    Spouse name: N/A  . Number of children: N/A  . Years of education: N/A   Occupational History  . disabled    Social History Main Topics  . Smoking status: Former Research scientist (life sciences)  . Smokeless tobacco: Never Used     Comment: quit 30 years ago  . Alcohol use No  . Drug use: No  . Sexual activity: Not on file   Other Topics Concern  . Not on file   Social History Narrative   Patient gets regular exercise   Allergies  Allergen Reactions  . Hydrocodone Other (See Comments)    tachycardia  . Ciprofloxacin Other (See Comments)    dizzy  . Penicillins Hives    Has patient had a PCN reaction causing immediate rash, facial/tongue/throat swelling, SOB or lightheadedness with hypotension: Yes Has patient had a PCN reaction causing severe rash involving mucus membranes or skin necrosis: No Has patient had a PCN reaction that required hospitalization No Has patient had a PCN reaction occurring within the last 10 years: Yes If all of the above answers are "NO", then may proceed  with Cephalosporin use.    Family History  Problem Relation Age of Onset  . Diabetes Sister     x 3  . Heart disease Sister     x2  . Diabetes Brother     x3  . Heart disease Brother     x2  . Coronary artery disease Other     female 1st degree  . Hypertension Other   . Colon cancer Neg Hx     Past medical history, social, surgical and family history all reviewed in electronic medical record.  No pertanent information unless stated regarding to the chief complaint.   Review of Systems:Review of systems updated and as accurate as of 11/18/16 Positive for headaches, muscle pains, joint aches, joint swelling, negative for chest pain, shortness of breath, dizziness, lightheadedness, hearing or visual disturbances. Positive for some mild  mood disorders.  Objective  There were no vitals taken for this visit. Systems examined below as of 11/18/16   General: No apparent distress alert and oriented x3 mood and affect normal, dressed appropriately. Obese.  HEENT: Pupils equal, extraocular movements intact  Respiratory: Patient's speak in full sentences and does not appear short of breath  Cardiovascular: No lower extremity edema, non tender, no erythema  Skin: Warm dry intact with no signs of infection or rash on extremities or on axial skeleton.  Abdomen: Soft nontender  Neuro: Cranial nerves II through XII are intact, neurovascularly intact in all extremities with 2+ DTRs and 2+ pulses.  Lymph: No lymphadenopathy of posterior or anterior cervical chain or axillae bilaterally.  Gait ambulating with the aid of a walker MSK:  tender with full range of motion and good stability and symmetric strength and tone of shoulders, elbows, wrist, hip, knee and ankles bilaterally. Arthritic changes of multiple joints Back Exam:  Inspection: loss of lordosis Motion: Flexion 30 deg worsening symptoms down the lateral aspect the leg. Extension 15 deg, Side Bending to 35 deg bilaterally,  Rotation to 25 deg bilaterally  SLR laying: positive right  XSLR laying: Negative  Palpable tenderness: Tender to palpation of the paraspinal musculature lumbar spine right greater than left especially over the L4-L5 area. FABER: positive right . Sensory change: Gross sensation intact to all lumbar and sacral dermatomes.  Reflexes: 2+ at both patellar tendons, 2+ at achilles tendons, Babinski's downgoing.  Strength at foot  4/5 strength noted on right with hip flexion and knee extension compared to left side.     Impression and Recommendations:     This case required medical decision making of moderate complexity.      Note: This dictation was prepared with Dragon dictation along with smaller phrase technology. Any transcriptional errors that  result from this process are unintentional.

## 2016-11-19 ENCOUNTER — Ambulatory Visit (INDEPENDENT_AMBULATORY_CARE_PROVIDER_SITE_OTHER): Payer: Medicare Other | Admitting: Family Medicine

## 2016-11-19 ENCOUNTER — Encounter: Payer: Self-pay | Admitting: Family Medicine

## 2016-11-19 ENCOUNTER — Telehealth: Payer: Self-pay | Admitting: Gastroenterology

## 2016-11-19 ENCOUNTER — Other Ambulatory Visit: Payer: Self-pay | Admitting: Family Medicine

## 2016-11-19 VITALS — BP 132/78 | HR 100 | Ht 66.0 in | Wt 272.0 lb

## 2016-11-19 DIAGNOSIS — M79604 Pain in right leg: Secondary | ICD-10-CM | POA: Diagnosis not present

## 2016-11-19 DIAGNOSIS — M5416 Radiculopathy, lumbar region: Secondary | ICD-10-CM | POA: Diagnosis not present

## 2016-11-19 MED ORDER — KETOROLAC TROMETHAMINE 30 MG/ML IJ SOLN
30.0000 mg | Freq: Once | INTRAMUSCULAR | Status: AC
  Administered 2016-11-19: 30 mg via INTRAMUSCULAR

## 2016-11-19 MED ORDER — METHYLPREDNISOLONE ACETATE 40 MG/ML IJ SUSP
40.0000 mg | Freq: Once | INTRAMUSCULAR | Status: AC
  Administered 2016-11-19: 40 mg via INTRAMUSCULAR

## 2016-11-19 MED ORDER — VENLAFAXINE HCL ER 37.5 MG PO CP24: 37.5000 mg | ORAL_CAPSULE | Freq: Every day | ORAL | 1 refills | Status: DC

## 2016-11-19 NOTE — Telephone Encounter (Signed)
Refill done.  

## 2016-11-19 NOTE — Patient Instructions (Addendum)
Good to see you  I am sorry you are still hurting.  We will get EMG and see how the nerve is hitting you  2 injection today to help with the pain  Effexor 37.5mg  daily  See me again in 2-3 weeks and if not better we will discuss imaging or get you in with Dr. Lynann Bologna again.

## 2016-11-19 NOTE — Assessment & Plan Note (Signed)
I believe the patient does have more of a lumbar radiculopathy. Discussed with patient at great length. We discussed icing regimen and home exercises. Patient is going to try to increase activity.  Patient was given 2 injections edema will avoid oral ant-nflammatories secondary to patient's kdney function recently. We'll avoid ral predisoneas well secondry to patient's blood glucose levels. We discussed icing regimen. Discussed that if any worsening symptoms we may need to consider new imaging of patient's back. Something such as a CT myelogram. Patient initial portion and do think an EMG could be valuable to see if any of this is coming from the back. I do not see any swelling and patient states that she's had in the leg today. Patient will monitor incision pictures if this does occur again.

## 2016-11-19 NOTE — Telephone Encounter (Signed)
Patient notified that she can try OTC products she has tried Pepcid with no relief.  She is not currently taking a PPI.  She is advised that she should purchase omeprazole OTC and keep the appt with Dr. Fuller Plan on 11/21/16

## 2016-11-21 ENCOUNTER — Other Ambulatory Visit: Payer: Self-pay | Admitting: *Deleted

## 2016-11-21 ENCOUNTER — Ambulatory Visit (INDEPENDENT_AMBULATORY_CARE_PROVIDER_SITE_OTHER): Payer: Medicare Other | Admitting: Gastroenterology

## 2016-11-21 ENCOUNTER — Encounter: Payer: Self-pay | Admitting: Gastroenterology

## 2016-11-21 ENCOUNTER — Ambulatory Visit (INDEPENDENT_AMBULATORY_CARE_PROVIDER_SITE_OTHER): Payer: Medicare Other | Admitting: Neurology

## 2016-11-21 VITALS — BP 112/68 | HR 88 | Ht 65.0 in | Wt 284.0 lb

## 2016-11-21 DIAGNOSIS — K921 Melena: Secondary | ICD-10-CM

## 2016-11-21 DIAGNOSIS — K219 Gastro-esophageal reflux disease without esophagitis: Secondary | ICD-10-CM | POA: Diagnosis not present

## 2016-11-21 DIAGNOSIS — R200 Anesthesia of skin: Secondary | ICD-10-CM

## 2016-11-21 DIAGNOSIS — K5900 Constipation, unspecified: Secondary | ICD-10-CM | POA: Diagnosis not present

## 2016-11-21 DIAGNOSIS — G629 Polyneuropathy, unspecified: Secondary | ICD-10-CM

## 2016-11-21 MED ORDER — NA SULFATE-K SULFATE-MG SULF 17.5-3.13-1.6 GM/177ML PO SOLN: 1.0000 | Freq: Once | ORAL | 0 refills | Status: AC

## 2016-11-21 MED ORDER — LINACLOTIDE 72 MCG PO CAPS: 72.0000 ug | ORAL_CAPSULE | Freq: Every day | ORAL | 11 refills | Status: DC

## 2016-11-21 NOTE — Progress Notes (Signed)
History of Present Illness: This is a 66 year old female with chronic constipation, hematochezia, GERD and gastroparesis who has been primarily followed at [facility] by Dr. Virgia Land since 2013. She is accompanied by her husband. She has ongoing problems with constipation and takes Linzess 145 mcg about every 3 days which leads to diarrhea. She has intermittent, small-volume hematochezia for a few years. At her last evaluation by Dr. Roney Mans in 2016 she was advised to undergo colonoscopy for evaluation of hematochezia however she did not proceed and did return for follow up with Dr. Roney Mans. She states she is reluctant to proceed with colonoscopy. She states her reflux symptoms are under good control on her current medication and diet.  GES 2014 at [facility]: Decreased 2-hour gastric retention.  Normal4-hour gastric retention.  EGD 2014 at [facility] Prior partial antrectomy Food residue in stomach   Allergies  Allergen Reactions  . Hydrocodone Other (See Comments)    tachycardia  . Ciprofloxacin Other (See Comments)    dizzy  . Penicillins Hives    Has patient had a PCN reaction causing immediate rash, facial/tongue/throat swelling, SOB or lightheadedness with hypotension: Yes Has patient had a PCN reaction causing severe rash involving mucus membranes or skin necrosis: No Has patient had a PCN reaction that required hospitalization No Has patient had a PCN reaction occurring within the last 10 years: Yes If all of the above answers are "NO", then may proceed with Cephalosporin use.    Outpatient Medications Prior to Visit  Medication Sig Dispense Refill  . AMBULATORY NON FORMULARY MEDICATION Domperidone 10 mg    Take one tablet by mouth twice a day before meals 120 tablet 3  . amitriptyline (ELAVIL) 10 MG tablet Take 10 mg by mouth at bedtime.     Marland Kitchen amLODipine-valsartan (EXFORGE) 10-320 MG tablet Take 1 tablet by mouth daily. 90 tablet 1  . aspirin 81 MG EC tablet Take 81 mg by mouth  daily.      . cloNIDine (CATAPRES) 0.1 MG tablet TAKE 1 TABLET (0.1 MG TOTAL) BY MOUTH 2 (TWO) TIMES DAILY. 60 tablet 2  . ELMIRON 100 MG capsule Take 100 mg by mouth 2 (two) times daily.     Marland Kitchen estradiol (ESTRACE) 2 MG tablet TAKE 1 TABLET (2 MG TOTAL) BY MOUTH DAILY. 30 tablet 0  . FLUoxetine (PROZAC) 20 MG capsule TAKE 1 CAPSULE (20 MG TOTAL) BY MOUTH DAILY. 90 capsule 1  . gabapentin (NEURONTIN) 600 MG tablet Take 600-1,200 mg by mouth 2 (two) times daily.   1  . glimepiride (AMARYL) 1 MG tablet TAKE 1 TABLET BY MOUTH WITH SUPPER ONCE A DAY PER DR. Dwyane Dee 30 tablet 2  . glucose blood (ACCU-CHEK AVIVA PLUS) test strip Use to test blood sugar 3 times daily- Dx code E11.9 100 each 12  . insulin aspart (NOVOLOG FLEXPEN) 100 UNIT/ML FlexPen Inject 10 Units into the skin 3 (three) times daily with meals.    . isosorbide dinitrate (ISORDIL) 10 MG tablet TAKE ONE TABLET BY MOUTH DAILY 90 tablet 1  . levothyroxine (SYNTHROID, LEVOTHROID) 88 MCG tablet TAKE 1 TABLET (88 MCG TOTAL) BY MOUTH DAILY. 30 tablet 2  . metFORMIN (GLUCOPHAGE-XR) 500 MG 24 hr tablet TAKE 1 TABLET (500 MG TOTAL) BY MOUTH 2 (TWO) TIMES DAILY. 60 tablet 2  . metoprolol (LOPRESSOR) 50 MG tablet Take one and one-half tablets twice a day (Patient taking differently: Take 50 mg by mouth at bedtime. ) 90 tablet 11  .  ondansetron (ZOFRAN) 4 MG tablet TAKE 1 TABLET BY MOUTH EVERY 6 HOURS AS NEEDED FOR NAUSEA 30 tablet 0  . simvastatin (ZOCOR) 20 MG tablet TAKE 1 TABLET (20 MG TOTAL) BY MOUTH DAILY. 90 tablet 1  . trimethoprim (TRIMPEX) 100 MG tablet Take 100 mg by mouth daily.   1  . venlafaxine XR (EFFEXOR-XR) 37.5 MG 24 hr capsule TAKE 1 CAPSULE(37.5 MG) BY MOUTH DAILY WITH BREAKFAST 90 capsule 1  . VICTOZA 18 MG/3ML SOPN Inject 0.2 mLs (1.2 mg total) into the skin daily. Inject once daily at the same time 2 pen 3  . Linaclotide (LINZESS) 145 MCG CAPS capsule Take 1 capsule (145 mcg total) by mouth every other day. 15 capsule 5  .  amLODipine-valsartan (EXFORGE) 10-320 MG tablet TAKE 1 TABLET BY MOUTH EVERY DAY 90 tablet 1  . omeprazole (PRILOSEC) 20 MG capsule TAKE 1 CAPSULE (20 MG TOTAL) BY MOUTH DAILY. 30 capsule 0  . sucralfate (CARAFATE) 1 GM/10ML suspension Take 2 teaspoons three times daily before meals. (Patient taking differently: Take 1 g by mouth. Take 2 teaspoons three times daily before meals.) 5400 mL 5   No facility-administered medications prior to visit.    Past Medical History:  Diagnosis Date  . Anxiety   . Arthritis   . CERVICAL RADICULOPATHY, LEFT   . CKD (chronic kidney disease)   . COMMON MIGRAINE   . CORONARY ARTERY DISEASE   . DEPRESSION   . Diabetes mellitus, type II ([place])   . DIVERTICULOSIS-COLON   . Gastric ulcer 04/2008  . Gastroparesis   . GERD (gastroesophageal reflux disease)   . Hiatal hernia   . Hyperlipidemia   . Hypertension   . Hypothyroidism   . INSOMNIA-SLEEP DISORDER-UNSPEC   . Iron deficiency anemia   . Wears glasses    Past Surgical History:  Procedure Laterality Date  . ABDOMINAL HYSTERECTOMY    . BACK SURGERY  03/2016  . CHOLECYSTECTOMY  06/2009  . COLONOSCOPY    . ESOPHAGOGASTRODUODENOSCOPY    . EYE SURGERY Bilateral    lasik  . Gastric Wedge resection lipoma  11/2007   x2 with laparotomy and gastrostomy  . Left knee replacement    . Rigth knee replacement with revision  04/2008   Dr. Berenice Primas  . ROTATOR CUFF REPAIR Left 01/2009  . s/p bladder surgury  09/2009   Dr. Terance Hart   Social History   Social History  . Marital status: Married    Spouse name: N/A  . Number of children: N/A  . Years of education: N/A   Occupational History  . disabled    Social History Main Topics  . Smoking status: Former Research scientist (life sciences)  . Smokeless tobacco: Never Used     Comment: quit 30 years ago  . Alcohol use No  . Drug use: No  . Sexual activity: Not Asked   Other Topics Concern  . None   Social History Narrative   Patient gets regular exercise   Family History    Problem Relation Age of Onset  . Diabetes Sister     x 3  . Heart disease Sister     x2  . Diabetes Brother     x3  . Heart disease Brother     x2  . Coronary artery disease Other     female 1st degree  . Hypertension Other   . Colon cancer Neg Hx        Physical Exam: General: Well developed, well nourished, obese, no  acute distress Head: Normocephalic and atraumatic Eyes:  sclerae anicteric, EOMI Ears: Normal auditory acuity Mouth: No deformity or lesions Lungs: Clear throughout to auscultation Heart: Regular rate and rhythm; no murmurs, rubs or bruits Abdomen: Soft, non tender and non distended. No masses, hepatosplenomegaly or hernias noted. Normal Bowel sounds Rectal: deferred to colonoscopy  Musculoskeletal: Symmetrical with no gross deformities  Pulses:  Normal pulses noted Extremities: No clubbing, cyanosis, edema or deformities noted Neurological: Alert oriented x 4, grossly nonfocal Psychological:  Alert and cooperative. Normal mood and affect  Assessment and Recommendations:  1. Hematochezia, small volume, intermittent. Rule out hemorrhoids, colorectal neoplasms and other disorders. Schedule colonoscopy. The risks (including bleeding, perforation, infection, missed lesions, medication reactions and possible hospitalization or surgery if complications occur), benefits, and alternatives to colonoscopy with possible biopsy and possible polypectomy were discussed with the patient and they consent to proceed.   2. Chronic constipation. Change to Linzess 72 mcg daily and take every day, not prn.   3. Gastroparesis, GERD. Continue current dietary modifications and continue lansoprazole 15 mg daily.

## 2016-11-21 NOTE — Procedures (Signed)
[facility]  [address]  [place], [place] [address] Tel: [phone] Fax:  [phone] Test Date:  11/21/2016  Patient: [name] DOB: 1951/05/09 Physician: Narda Amber, DO  Sex: Female Height: 5\' 6"  Ref Phys: Charlann Boxer, D.O.  ID#: [id number] Temp: 33.6C Technician: Jerilynn Mages. Dean   Patient Complaints: This is a 66 year old female with history of L4-L5 decompression and fusion and diabetes referred for evaluation of right leg pain and weakness.   NCV & EMG Findings: Electrodiagnostic testing of the right lower extremity and additional studies of the left was extremely technically challenging. Findings are as follows.  1. Bilateral sural and superficial peroneal sensory responses are absent. 2. Bilateral peroneal motor responses show reduced amplitude, this may be in part, be due to technical limitations due to body physiognomy. Right tibial motor responses within normal limits. 3. Bilateral tibial H reflex is prolonged. 4. Chronic motor axon loss changes are seen affecting the muscles below the knee. There is no evidence of accompanied active denervation. Please note needle electrode examination of the quadriceps muscles was technically challenging as the needle did not reach past soft tissue.   Impression: 1. The electrophysiologic findings are most consistent with a chronic sensorimotor polyneuropathy, axon loss in type, affecting the lower extremities.  2. There is no definite evidence of a lumbosacral radiculopathy, however due to technical limitations due to body physiognomy, proximal muscle groups were unable to be sampled. Clinical correlation is recommended.   ___________________________ Narda Amber, DO    Nerve Conduction Studies Anti Sensory Summary Table   Site NR Peak (ms) Norm Peak (ms) P-T Amp (V) Norm P-T Amp  Left Sup Peroneal Anti Sensory (Ant Lat Mall)  33.6C  12 cm NR  <4.6  >3  Right Sup Peroneal Anti Sensory (Ant Lat Mall)   33.6C  12 cm NR  <4.6  >3  Left Sural Anti Sensory (Lat Mall)  33.6C  Calf NR  <4.6  >3  Right Sural Anti Sensory (Lat Mall)  33.6C  Calf NR  <4.6  >3   Motor Summary Table   Site NR Onset (ms) Norm Onset (ms) O-P Amp (mV) Norm O-P Amp Site1 Site2 Delta-0 (ms) Dist (cm) Vel (m/s) Norm Vel (m/s)  Left Peroneal Motor (Ext Dig Brev)  33.6C  Ankle    4.8 <6.0 0.1 >2.5 B Fib Ankle 11.3 33.0 29 >40  B Fib    16.1  0.0  Poplt B Fib 4.2 10.0 24 >40  Poplt    20.3  0.0         Right Peroneal Motor (Ext Dig Brev)  33.6C  Ankle    3.0 <6.0 1.4 >2.5 B Fib Ankle 7.9 32.0 41 >40  B Fib    10.9  0.6  Poplt B Fib 1.5 8.0 53 >40  Poplt    12.4  1.1         Left Peroneal TA Motor (Tib Ant)  33.6C  Fib Head    3.2 <4.5 1.4 >3 Poplit Fib Head 1.4 10.0 71 >40  Poplit    4.6  1.0         Right Peroneal TA Motor (Tib Ant)  33.6C    Technically challenged  Fib Head    1.8 <4.5 2.0 >3 Poplit Fib Head 2.0 10.0 50 >40  Poplit    3.8  1.5         Right Tibial Motor (Abd Hall Brev)  33.6C    body habitus  behind knee  Ankle    4.1 <6.0 4.0 >4 Knee Ankle 7.8 38.0 49 >40  Knee    11.9  0.4          H Reflex Studies   NR H-Lat (ms) Lat Norm (ms) L-R H-Lat (ms) M-Lat (ms) HLat-MLat (ms)  Left Tibial (Gastroc)  33.6C     37.69 <35 1.23 4.22 33.47  Right Tibial (Gastroc)  33.6C     36.46 <35 1.23 3.13 33.33   EMG   Side Muscle Ins Act Fibs Psw Fasc Number Recrt Dur Dur. Amp Amp. Poly Poly. Comment  Right AntTibialis Nml Nml Nml Nml 1- Rapid Some 1+ Some 1+ Nml Nml N/A  Right Gastroc Nml Nml Nml Nml 1- Rapid Some 1+ Some 1+ Nml Nml N/A  Right Flex Dig Long Nml Nml Nml Nml [facility] Rapid Many 1+ Many 1+ Nml Nml N/A  Right RectFemoris Nml Nml Nml Nml NE - - - - - - - N/A  Right GluteusMed Nml Nml Nml Nml Nml Nml Nml Nml Nml Nml Nml Nml N/A  Right BicepsFemS Nml Nml Nml Nml Nml Nml Nml Nml Nml Nml Nml Nml N/A      Waveforms:

## 2016-11-21 NOTE — Patient Instructions (Signed)
We have sent the following medications to your pharmacy for you to pick up at your convenience: Linzess 72 mcg daily.   You have been scheduled for a colonoscopy. Please follow written instructions given to you at your visit today.  Please pick up your prep supplies at the pharmacy within the next 1-3 days. If you use inhalers (even only as needed), please bring them with you on the day of your procedure. Your physician has requested that you go to www.startemmi.com and enter the access code given to you at your visit today. This web site gives a general overview about your procedure. However, you should still follow specific instructions given to you by our office regarding your preparation for the procedure.  Normal BMI (Body Mass Index- based on height and weight) is between 23 and 30. Your BMI today is Body mass index is 47.26 kg/m. Marland Kitchen Please consider follow up  regarding your BMI with your Primary Care Provider.  Thank you for choosing me and [facility].  Pricilla Riffle. Dagoberto Ligas., MD., Marval Regal

## 2016-11-26 ENCOUNTER — Telehealth: Payer: Self-pay | Admitting: *Deleted

## 2016-11-26 ENCOUNTER — Telehealth: Payer: Self-pay | Admitting: Family Medicine

## 2016-11-26 NOTE — Telephone Encounter (Signed)
Will you please call patient with her EMG results?

## 2016-11-26 NOTE — Telephone Encounter (Signed)
Patient states she was referred to Dr. Posey Pronto by Dr. Tamala Julian.  Patient states she was to send all of her findings over to Dr. Tamala Julian for him to contact her.  Patient is requesting follow up in regard.

## 2016-11-26 NOTE — Telephone Encounter (Signed)
Duplicate note

## 2016-11-26 NOTE — Telephone Encounter (Signed)
Discussed results with pt.

## 2016-12-02 NOTE — Progress Notes (Signed)
[name] [address] [place], [place] [address] Phone: [phone] Subjective:    I'm seeing this patient by the request  of:  Cathlean Cower, MD   CC: Right leg pain f/u   XBL:[id number]  [name] is a 66 y.o. female coming in with complaint of right leg pain. Past medical history shows that within the last 8 months patient did have a right-sided lumbar L4-L5 transforaminal interbody fusion Patient was having pain and limited to make sure was not coming from patient's back. Patient was sent for an EMG. This was a difficult assessment secondary to patient's body habitus. Patient was found to have consistent chronic sensorimotor polyneuropathy. Inconclusive on how much was potentially coming from the back. Patient was to start increasing activity slowly. Patient states Continues to have the pain. Patient is a this is still affecting daily activities. Make it difficult to walk long distances second due to the pain. Feels like she has to pull her right leg. Distal state that the radicular symptoms in the foot and lower leg is completely resolved since the surgery but unfortunately the radicular symptoms in the thigh in back seems to be worsening.  Past Medical History:  Diagnosis Date  . Anxiety   . Arthritis   . CERVICAL RADICULOPATHY, LEFT   . CKD (chronic kidney disease)   . COMMON MIGRAINE   . CORONARY ARTERY DISEASE   . DEPRESSION   . Diabetes mellitus, type II ([place])   . DIVERTICULOSIS-COLON   . Gastric ulcer 04/2008  . Gastroparesis   . GERD (gastroesophageal reflux disease)   . Hiatal hernia   . Hyperlipidemia   . Hypertension   . Hypothyroidism   . INSOMNIA-SLEEP DISORDER-UNSPEC   . Iron deficiency anemia   . Wears glasses    Past Surgical History:  Procedure Laterality Date  . ABDOMINAL HYSTERECTOMY    . BACK SURGERY  03/2016  . CHOLECYSTECTOMY  06/2009  . COLONOSCOPY    . ESOPHAGOGASTRODUODENOSCOPY    . EYE SURGERY Bilateral    lasik  . Gastric Wedge resection lipoma  11/2007   x2 with laparotomy and gastrostomy  . Left knee replacement    . Rigth knee replacement with revision  04/2008   Dr. Berenice Primas  . ROTATOR CUFF REPAIR Left 01/2009  . s/p bladder surgury  09/2009   Dr. Terance Hart   Social History   Social History  . Marital status: Married    Spouse name: N/A  . Number of children: N/A  . Years of education: N/A   Occupational History  . disabled    Social History Main Topics  . Smoking status: Former Research scientist (life sciences)  . Smokeless tobacco: Never Used     Comment: quit 30 years ago  . Alcohol use No  . Drug use: No  . Sexual activity: Not Asked   Other Topics Concern  . None   Social History Narrative   Patient gets regular exercise   Allergies  Allergen Reactions  . Hydrocodone Other (See Comments)    tachycardia  . Ciprofloxacin Other (See Comments)    dizzy  . Penicillins Hives    Has patient had a PCN reaction causing immediate rash, facial/tongue/throat swelling, SOB or lightheadedness with hypotension: Yes Has patient had a PCN reaction causing severe rash involving mucus membranes or skin necrosis: No Has patient had a PCN reaction that required hospitalization No Has patient had a PCN reaction occurring within the last 10 years: Yes If all  of the above answers are "NO", then may proceed with Cephalosporin use.    Family History  Problem Relation Age of Onset  . Diabetes Sister     x 3  . Heart disease Sister     x2  . Diabetes Brother     x3  . Heart disease Brother     x2  . Coronary artery disease Other     female 1st degree  . Hypertension Other   . Colon cancer Neg Hx     Past medical history, social, surgical and family history all reviewed in electronic medical record.  No pertanent information unless stated regarding to the chief complaint.   Review of Systems: No headache, visual changes, nausea, vomiting, diarrhea, constipation, dizziness, abdominal pain, skin rash,  fevers, chills, night sweats, weight loss, swollen lymph nodes, body aches, joint swelling,  chest pain, shortness of breath, mood changes.    Objective  Blood pressure 124/70, pulse 90, height 5\' 6"  (1.676 m), weight 280 lb (127 kg), SpO2 100 %.   Systems examined below as of 12/03/16 General: NAD A&O x3 mood, affect normal  HEENT: Pupils equal, extraocular movements intact no nystagmus Respiratory: not short of breath at rest or with speaking Cardiovascular: No lower extremity edema, non tender Skin: Warm dry intact with no signs of infection or rash on extremities or on axial skeleton. Abdomen: Soft nontender, no masses Neuro: Cranial nerves  intact, neurovascularly intact in all extremities with 2+ DTRs and 2+ pulses. Lymph: No lymphadenopathy appreciated today  Gait antalgic gait but no longer walking with the walker MSK:  tender with full range of motion and good stability and symmetric strength and tone of shoulders, elbows, wrist, hip, knee and ankles bilaterally. Arthritic changes of multiple joints Back Exam:  Inspection: loss of lordosis Motion: Flexion 30 deg worsening symptoms down the lateral aspect the leg stable from previous exam. Extension 15 deg, Side Bending to 35 deg bilaterally,  Rotation to 25 deg bilaterally  SLR laying: positive right  XSLR laying: Negative  Palpable tenderness: Worsening tenderness to palpation in the paraspinal musculature over L4-L5 area on the right side FABER: Unable to do secondary to tightness Sensory change: Gross sensation intact to all lumbar and sacral dermatomes.  Reflexes are 1+ but symmetric Strength at foot  4/5 strength noted on right with hip flexion and knee extension compared to left side.     Impression and Recommendations:     This case required medical decision making of moderate complexity.      Note: This dictation was prepared with Dragon dictation along with smaller phrase technology. Any transcriptional  errors that result from this process are unintentional.

## 2016-12-03 ENCOUNTER — Ambulatory Visit (INDEPENDENT_AMBULATORY_CARE_PROVIDER_SITE_OTHER): Payer: Medicare Other | Admitting: Family Medicine

## 2016-12-03 ENCOUNTER — Encounter: Payer: Self-pay | Admitting: Family Medicine

## 2016-12-03 DIAGNOSIS — M5416 Radiculopathy, lumbar region: Secondary | ICD-10-CM

## 2016-12-03 MED ORDER — VENLAFAXINE HCL ER 37.5 MG PO CP24: 75.0000 mg | ORAL_CAPSULE | Freq: Every day | ORAL | 1 refills | Status: DC

## 2016-12-03 NOTE — Patient Instructions (Addendum)
Good to see you  Try to go up to 75mg  daily on the effexor and see how you do.  This may just be time until the nerve fires correctly.  It also could be from the diabetes.  I will reach out to [facility] and see what he thinks is the next step and will get back to you.

## 2016-12-03 NOTE — Assessment & Plan Note (Addendum)
Still having symptoms, mild positive straight leg test with weakness notes. Discussed with patient again at great length. We discussed with the EMG being inconclusive I do feel that further workup is potentially necessary. Patient still has a positive straight leg test, mild weakness of the lower extremity as well. We discussed that this could be still secondary in some healing meniscus and patient probably should discuss this with her that a Psychologist, sport and exercise. We did suggest the patient should do formal physical therapy again. Patient declined this option of this time. Increase Effexor to 75 mg for some radicular help. We discussed icing regimen. Discussed that there is not a lot of other potential treatment options that I have available to her at this time.  Spent  25 minutes with patient face-to-face and had greater than 50% of counseling including as described above in assessment and plan.

## 2016-12-06 ENCOUNTER — Other Ambulatory Visit: Payer: Self-pay | Admitting: Internal Medicine

## 2016-12-23 DIAGNOSIS — M545 Low back pain: Secondary | ICD-10-CM | POA: Diagnosis not present

## 2016-12-24 ENCOUNTER — Other Ambulatory Visit: Payer: Medicare Other

## 2016-12-24 DIAGNOSIS — Z794 Long term (current) use of insulin: Secondary | ICD-10-CM | POA: Diagnosis not present

## 2016-12-24 DIAGNOSIS — E1165 Type 2 diabetes mellitus with hyperglycemia: Secondary | ICD-10-CM | POA: Diagnosis not present

## 2016-12-25 ENCOUNTER — Encounter: Payer: Self-pay | Admitting: Endocrinology

## 2016-12-25 ENCOUNTER — Ambulatory Visit (INDEPENDENT_AMBULATORY_CARE_PROVIDER_SITE_OTHER): Payer: Medicare Other | Admitting: Endocrinology

## 2016-12-25 VITALS — BP 130/80 | HR 96 | Temp 98.5°F | Ht 66.0 in | Wt 287.2 lb

## 2016-12-25 DIAGNOSIS — E890 Postprocedural hypothyroidism: Secondary | ICD-10-CM

## 2016-12-25 DIAGNOSIS — M47816 Spondylosis without myelopathy or radiculopathy, lumbar region: Secondary | ICD-10-CM | POA: Diagnosis not present

## 2016-12-25 DIAGNOSIS — Z794 Long term (current) use of insulin: Secondary | ICD-10-CM

## 2016-12-25 DIAGNOSIS — E1165 Type 2 diabetes mellitus with hyperglycemia: Secondary | ICD-10-CM

## 2016-12-25 LAB — FRUCTOSAMINE: Fructosamine: 306 umol/L — ABNORMAL HIGH (ref 0–285)

## 2016-12-25 NOTE — Progress Notes (Signed)
[name] is a 66 y.o. female.    Reason for Appointment: Diabetes follow-up   History of Present Illness   Diagnosis: Type 2 DIABETES MELITUS, date of diagnosis: 2000  Previous history: She had initially been treated with metformin and Amaryl and subsequently given Victoza which helped overall control and weight loss. In 9/13 because of significant hyperglycemia she was started on basal bolus insulin also. Had required relatively small doses of insulin. Her blood sugar control has been excellent with A1c between 5.3-6.0 although this may be relatively higher than expected. Tends to have relatively higher readings after supper In 6/15 she was told to resume her Victoza and stop her Lantus since she had been gaining weight with Lantus and NovoLog regimen.   Recent history:     INSULIN: Novolog 10-15 units before meals; Lantus 20 units daily   Non-insulin hypoglycemic drugs: Amaryl 1 mg acs , Victoza 1.2 mg daily    Her last A1c was 6.9 but her blood sugars were much higher than expected Now her blood sugars are better and her fructosamine is slightly increased at 305  Current blood sugar patterns and management:  Her blood sugars are being checked mostly midday, she is generally eating late breakfast and also doing a few readings after supper 2 hours later  Her Lantus was increased on her last visit and she has gone up further  However her fasting readings are still mostly high with only one good reading of 120  Her blood sugars after supper are averaging about 200  She says that she is trying to adjust her Lantus based on what she is eating but most of her readings at night are high  Also has only a couple of readings after her first meal which appear to be fair.  She tries to go up to 15 units for larger meals in the evening but not really looking at carbohydrates  She tries to take her NovoLog insulin before eating but sometimes will take it while  eating  Last night she took 25 units of Novolog because of eating cake and ice cream but she felt shaky after this, did not check her sugar  Otherwise No hypoglycemia  Has gained 7 pounds since her last visit  This is despite continuing Victoza     Side effects from medications:  none  Monitors blood glucose: Once a day or less on average .    Glucometer:  Accu-Chek compact.          Blood Glucose readings from monitor download   Mean values apply above for all meters except median for One Touch  PRE-MEAL Fasting Lunch Dinner Bedtime Overall  Glucose range: 120-201   87  182-224    Mean/median: 155    206  168        Meals: 1-2 meals per day. Supper at 7 PM pm           Physical activity: exercise: Limited, has had fatigue, back and leg pain.           Dietician visit: Most recent: 06/3817           Complications: are: None  Wt Readings from Last 3 Encounters:  12/25/16 287 lb 3.2 oz (130.3 kg)  12/03/16 280 lb (127 kg)  11/21/16 284 lb (128.8 kg)    LABS:  Lab Results  Component Value Date   HGBA1C 6.9 (H) 11/11/2016   HGBA1C 6.4 02/12/2016   HGBA1C 5.6 11/01/2015  Lab Results  Component Value Date   MICROALBUR 2.1 (H) 11/11/2016   LDLCALC 103 (H) 02/12/2016   CREATININE 1.67 (H) 11/11/2016   Other active problems: See review of systems   Appointment on 12/24/2016  Component Date Value Ref Range Status  . Fructosamine 12/24/2016 306* 0 - 285 umol/L Final   Comment: Published reference interval for apparently healthy subjects between age 67 and 57 is 73 - 285 umol/L and in a poorly controlled diabetic population is 228 - 563 umol/L with a mean of 396 umol/L.     Allergies as of 12/25/2016      Reactions   Hydrocodone Other (See Comments)   tachycardia   Ciprofloxacin Other (See Comments)   dizzy   Penicillins Hives   Has patient had a PCN reaction causing immediate rash, facial/tongue/throat swelling, SOB or lightheadedness with hypotension:  Yes Has patient had a PCN reaction causing severe rash involving mucus membranes or skin necrosis: No Has patient had a PCN reaction that required hospitalization No Has patient had a PCN reaction occurring within the last 10 years: Yes If all of the above answers are "NO", then may proceed with Cephalosporin use.      Medication List       Accurate as of 12/25/16  3:30 PM. Always use your most recent med list.          AMBULATORY NON FORMULARY MEDICATION Domperidone 10 mg    Take one tablet by mouth twice a day before meals   amitriptyline 10 MG tablet Commonly known as:  ELAVIL Take 10 mg by mouth at bedtime.   amLODipine-valsartan 10-320 MG tablet Commonly known as:  EXFORGE Take 1 tablet by mouth daily.   aspirin 81 MG EC tablet Take 81 mg by mouth daily.   cloNIDine 0.1 MG tablet Commonly known as:  CATAPRES TAKE 1 TABLET (0.1 MG TOTAL) BY MOUTH 2 (TWO) TIMES DAILY.   ELMIRON 100 MG capsule Generic drug:  pentosan polysulfate Take 100 mg by mouth 2 (two) times daily.   estradiol 2 MG tablet Commonly known as:  ESTRACE TAKE 1 TABLET (2 MG TOTAL) BY MOUTH DAILY.   FLUoxetine 20 MG capsule Commonly known as:  PROZAC TAKE 1 CAPSULE (20 MG TOTAL) BY MOUTH DAILY.   gabapentin 600 MG tablet Commonly known as:  NEURONTIN Take 600-1,200 mg by mouth 2 (two) times daily.   glimepiride 1 MG tablet Commonly known as:  AMARYL TAKE 1 TABLET BY MOUTH WITH SUPPER ONCE A DAY PER DR. Dwyane Dee   glucose blood test strip Commonly known as:  ACCU-CHEK AVIVA PLUS Use to test blood sugar 3 times daily- Dx code E11.9   isosorbide dinitrate 10 MG tablet Commonly known as:  ISORDIL TAKE ONE TABLET BY MOUTH DAILY   lansoprazole 15 MG capsule Commonly known as:  PREVACID Take 15 mg by mouth daily at 12 noon.   levothyroxine 88 MCG tablet Commonly known as:  SYNTHROID, LEVOTHROID TAKE 1 TABLET (88 MCG TOTAL) BY MOUTH DAILY.   linaclotide 72 MCG capsule Commonly known as:   LINZESS Take 1 capsule (72 mcg total) by mouth daily before breakfast.   metFORMIN 500 MG 24 hr tablet Commonly known as:  GLUCOPHAGE-XR TAKE 1 TABLET (500 MG TOTAL) BY MOUTH 2 (TWO) TIMES DAILY.   metoprolol 50 MG tablet Commonly known as:  LOPRESSOR Take one and one-half tablets twice a day   NOVOLOG FLEXPEN 100 UNIT/ML FlexPen Generic drug:  insulin aspart Inject 10 Units into the  skin 3 (three) times daily with meals.   ondansetron 4 MG tablet Commonly known as:  ZOFRAN TAKE 1 TABLET BY MOUTH EVERY 6 HOURS AS NEEDED FOR NAUSEA   simvastatin 20 MG tablet Commonly known as:  ZOCOR Take 1 tablet (20 mg total) by mouth daily. Overdue for yearly physical w/Labs must see MD for refills   trimethoprim 100 MG tablet Commonly known as:  TRIMPEX Take 100 mg by mouth daily.   venlafaxine XR 37.5 MG 24 hr capsule Commonly known as:  EFFEXOR-XR Take 2 capsules (75 mg total) by mouth daily with breakfast.   VICTOZA 18 MG/3ML Sopn Generic drug:  liraglutide Inject 0.2 mLs (1.2 mg total) into the skin daily. Inject once daily at the same time       Allergies:  Allergies  Allergen Reactions  . Hydrocodone Other (See Comments)    tachycardia  . Ciprofloxacin Other (See Comments)    dizzy  . Penicillins Hives    Has patient had a PCN reaction causing immediate rash, facial/tongue/throat swelling, SOB or lightheadedness with hypotension: Yes Has patient had a PCN reaction causing severe rash involving mucus membranes or skin necrosis: No Has patient had a PCN reaction that required hospitalization No Has patient had a PCN reaction occurring within the last 10 years: Yes If all of the above answers are "NO", then may proceed with Cephalosporin use.     Past Medical History:  Diagnosis Date  . Anxiety   . Arthritis   . CERVICAL RADICULOPATHY, LEFT   . CKD (chronic kidney disease)   . COMMON MIGRAINE   . CORONARY ARTERY DISEASE   . DEPRESSION   . Diabetes mellitus, type II  ([place])   . DIVERTICULOSIS-COLON   . Gastric ulcer 04/2008  . Gastroparesis   . GERD (gastroesophageal reflux disease)   . Hiatal hernia   . Hyperlipidemia   . Hypertension   . Hypothyroidism   . INSOMNIA-SLEEP DISORDER-UNSPEC   . Iron deficiency anemia   . Wears glasses     Past Surgical History:  Procedure Laterality Date  . ABDOMINAL HYSTERECTOMY    . BACK SURGERY  03/2016  . CHOLECYSTECTOMY  06/2009  . COLONOSCOPY    . ESOPHAGOGASTRODUODENOSCOPY    . EYE SURGERY Bilateral    lasik  . Gastric Wedge resection lipoma  11/2007   x2 with laparotomy and gastrostomy  . Left knee replacement    . Rigth knee replacement with revision  04/2008   Dr. Berenice Primas  . ROTATOR CUFF REPAIR Left 01/2009  . s/p bladder surgury  09/2009   Dr. Terance Hart    Family History  Problem Relation Age of Onset  . Diabetes Sister     x 3  . Heart disease Sister     x2  . Diabetes Brother     x3  . Heart disease Brother     x2  . Coronary artery disease Other     female 1st degree  . Hypertension Other   . Colon cancer Neg Hx     Social History:  reports that she has quit smoking. She has never used smokeless tobacco. She reports that she does not drink alcohol or use drugs.  Review of Systems:  Hypertension: Her blood pressure is treated with metoprolol, clonidine Twice a day and Exforge Recently blood pressure is better  Mild CKD: Her creatinine is moderately increased but fluctuating Followed by nephrologist every 6 months   Lab Results  Component Value Date   CREATININE  1.67 (H) 11/11/2016   CREATININE 1.49 (H) 04/03/2016   CREATININE 1.93 (H) 03/28/2016   CREATININE 1.30 (H) 02/12/2016     Lipids: Followed by PCP, LDL has been inconsistently controlled, only on 20 mg Zocor She is also on amlodipine  Lab Results  Component Value Date   CHOL 212 (H) 02/12/2016   HDL 86.20 02/12/2016   LDLCALC 103 (H) 02/12/2016   LDLDIRECT 156.6 01/07/2011   TRIG 113.0 02/12/2016   CHOLHDL 2  02/12/2016     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 Previously she had I-131 treatment on 08/20/11 for Graves' disease Last TSH normal    Lab Results  Component Value Date   TSH 1.33 11/11/2016   TSH 5.06 (H) 02/12/2016   TSH 0.83 11/01/2015   FREET4 0.82 11/11/2016   FREET4 0.67 02/12/2016   FREET4 0.76 11/01/2015    On Gabapentin For leg pains, was told to have neuropathy by sports medicine specialist May have epidural steroid for her lumbar radiculopathy    Examination:   BP 130/80 (BP Location: Left Arm, Patient Position: Sitting, Cuff Size: Normal)   Pulse 96   Temp 98.5 F (36.9 C) (Oral)   Ht 5\' 6"  (1.676 m)   Wt 287 lb 3.2 oz (130.3 kg)   SpO2 98%   BMI 46.36 kg/m   Body mass index is 46.36 kg/m.     ASSESSMENT/ PLAN:   Diabetes type 2, uncontrolled :   See history of present illness for detailed discussion of his current management, blood sugar patterns and problems identified  Blood sugars are  somewhat better recently although she is still having mostly high readings waking up and at bedtime despite taking more insulin and basically doing a basal bolus insulin regimen She has however gained weight with improving her sugars Only occasionally will go off her diet and eat sweets  She is on 1.2 mg Victoza, not using 1.8 because of cost   Recommendations made:   increase Lantus by at least 2 units, Discussed fasting blood sugar targets with titration.  Consider consultation with dietitian  She will look at carbohydrate content of her meal and adjust NovoLog between 12-16 units at suppertime   Discussed postprandial blood sugar targets  Continue 10 units for her first meal  Hopefully can start exercise after treatment of her sciatica  Discussed doses to be taken when she gets an epidural steroid  Hypothyroidism:  adequately replaced as of 2/18 TSH will be rechecked  on the next visit  HYPERTENSION:  Controlled, Continue same regimen    Patient Instructions  Lantus 22  Novolog 12-16 at suuper  Stop Glimeperide     Counseling time on subjects discussed above is over 50% of today's 25 minute visit   [name] 12/25/2016, 3:30 PM   Note: This office note was prepared with Dragon voice recognition system technology. Any transcriptional errors that result from this process are unintentional.

## 2016-12-25 NOTE — Patient Instructions (Addendum)
Lantus 22; may need 24 if am still > 140;  add 4-6  more with cortisone  Novolog 12-16 at supper based on meal size  Stop Glimeperide   Check blood sugars on waking up  3x weekly  Also check blood sugars about 2 hours after a meal and do this after different meals by rotation  Recommended blood sugar levels on waking up is 90-130 and about 2 hours after meal is 130-160  Please bring your blood sugar monitor to each visit, thank you

## 2016-12-26 DIAGNOSIS — M47816 Spondylosis without myelopathy or radiculopathy, lumbar region: Secondary | ICD-10-CM | POA: Diagnosis not present

## 2016-12-27 ENCOUNTER — Encounter: Payer: Self-pay | Admitting: Internal Medicine

## 2016-12-27 ENCOUNTER — Ambulatory Visit (INDEPENDENT_AMBULATORY_CARE_PROVIDER_SITE_OTHER): Payer: Medicare Other | Admitting: Internal Medicine

## 2016-12-27 VITALS — BP 124/68 | HR 101 | Temp 98.4°F | Ht 66.0 in | Wt 285.0 lb

## 2016-12-27 DIAGNOSIS — M7989 Other specified soft tissue disorders: Secondary | ICD-10-CM | POA: Insufficient documentation

## 2016-12-27 DIAGNOSIS — G6289 Other specified polyneuropathies: Secondary | ICD-10-CM | POA: Diagnosis not present

## 2016-12-27 DIAGNOSIS — I10 Essential (primary) hypertension: Secondary | ICD-10-CM | POA: Diagnosis not present

## 2016-12-27 NOTE — Assessment & Plan Note (Signed)
stable overall by history and exam, recent data reviewed with pt, and pt to continue medical treatment as before,  to f/u any worsening symptoms or concerns BP Readings from Last 3 Encounters:  12/27/16 124/68  12/25/16 130/80  12/03/16 124/70

## 2016-12-27 NOTE — Assessment & Plan Note (Signed)
To cont gabapentin for pain control, cont to follow

## 2016-12-27 NOTE — Progress Notes (Signed)
Pre visit review using our clinic review tool, if applicable. No additional management support is needed unless otherwise documented below in the visit note. 

## 2016-12-27 NOTE — Progress Notes (Signed)
Subjective:    Patient ID: [name], female    DOB: [birth year], 66 y.o.   MRN: [id number]  HPI  Here with persistent RLE swelling to the hip more than left assoc with discomfort worse after standing longer periods.  Has hx of peripheral neuropathy but no specific lumbar related neurologic compromise  No recent trauma. Pt denies chest pain, increased sob or doe, wheezing, orthopnea, PND, palpitations, dizziness or syncope. Right thigh 34 inches per pt compared to 28 cm left thigh circumference.  Incidentally s/p facet injection to right lower back per ortho, and being considered for nerve ablation.   Past Medical History:  Diagnosis Date  . Anxiety   . Arthritis   . CERVICAL RADICULOPATHY, LEFT   . CKD (chronic kidney disease)   . COMMON MIGRAINE   . CORONARY ARTERY DISEASE   . DEPRESSION   . Diabetes mellitus, type II ([place])   . DIVERTICULOSIS-COLON   . Gastric ulcer 04/2008  . Gastroparesis   . GERD (gastroesophageal reflux disease)   . Hiatal hernia   . Hyperlipidemia   . Hypertension   . Hypothyroidism   . INSOMNIA-SLEEP DISORDER-UNSPEC   . Iron deficiency anemia   . Wears glasses    Past Surgical History:  Procedure Laterality Date  . ABDOMINAL HYSTERECTOMY    . BACK SURGERY  03/2016  . CHOLECYSTECTOMY  06/2009  . COLONOSCOPY    . ESOPHAGOGASTRODUODENOSCOPY    . EYE SURGERY Bilateral    lasik  . Gastric Wedge resection lipoma  11/2007   x2 with laparotomy and gastrostomy  . Left knee replacement    . Rigth knee replacement with revision  04/2008   Dr. Berenice Primas  . ROTATOR CUFF REPAIR Left 01/2009  . s/p bladder surgury  09/2009   Dr. Terance Hart    reports that she has quit smoking. She has never used smokeless tobacco. She reports that she does not drink alcohol or use drugs. family history includes Coronary artery disease in her other; Diabetes in her brother and sister; Heart disease in her brother and sister; Hypertension in her other. Allergies  Allergen  Reactions  . Hydrocodone Other (See Comments)    tachycardia  . Ciprofloxacin Other (See Comments)    dizzy  . Penicillins Hives    Has patient had a PCN reaction causing immediate rash, facial/tongue/throat swelling, SOB or lightheadedness with hypotension: Yes Has patient had a PCN reaction causing severe rash involving mucus membranes or skin necrosis: No Has patient had a PCN reaction that required hospitalization No Has patient had a PCN reaction occurring within the last 10 years: Yes If all of the above answers are "NO", then may proceed with Cephalosporin use.    Current Outpatient Prescriptions on File Prior to Visit  Medication Sig Dispense Refill  . AMBULATORY NON FORMULARY MEDICATION Domperidone 10 mg    Take one tablet by mouth twice a day before meals 120 tablet 3  . amitriptyline (ELAVIL) 10 MG tablet Take 10 mg by mouth at bedtime.     Marland Kitchen amLODipine-valsartan (EXFORGE) 10-320 MG tablet Take 1 tablet by mouth daily. 90 tablet 1  . aspirin 81 MG EC tablet Take 81 mg by mouth daily.      . cloNIDine (CATAPRES) 0.1 MG tablet TAKE 1 TABLET (0.1 MG TOTAL) BY MOUTH 2 (TWO) TIMES DAILY. 60 tablet 2  . ELMIRON 100 MG capsule Take 100 mg by mouth 2 (two) times daily.     Marland Kitchen estradiol (ESTRACE) 2 MG  tablet TAKE 1 TABLET (2 MG TOTAL) BY MOUTH DAILY. 30 tablet 0  . FLUoxetine (PROZAC) 20 MG capsule TAKE 1 CAPSULE (20 MG TOTAL) BY MOUTH DAILY. 90 capsule 1  . gabapentin (NEURONTIN) 600 MG tablet Take 600-1,200 mg by mouth 2 (two) times daily.   1  . glimepiride (AMARYL) 1 MG tablet TAKE 1 TABLET BY MOUTH WITH SUPPER ONCE A DAY PER DR. Dwyane Dee 30 tablet 2  . glucose blood (ACCU-CHEK AVIVA PLUS) test strip Use to test blood sugar 3 times daily- Dx code E11.9 100 each 12  . insulin aspart (NOVOLOG FLEXPEN) 100 UNIT/ML FlexPen Inject 10 Units into the skin 3 (three) times daily with meals.    . isosorbide dinitrate (ISORDIL) 10 MG tablet TAKE ONE TABLET BY MOUTH DAILY 90 tablet 1  .  lansoprazole (PREVACID) 15 MG capsule Take 15 mg by mouth daily at 12 noon.    Marland Kitchen levothyroxine (SYNTHROID, LEVOTHROID) 88 MCG tablet TAKE 1 TABLET (88 MCG TOTAL) BY MOUTH DAILY. 30 tablet 2  . linaclotide (LINZESS) 72 MCG capsule Take 1 capsule (72 mcg total) by mouth daily before breakfast. 30 capsule 11  . metFORMIN (GLUCOPHAGE-XR) 500 MG 24 hr tablet TAKE 1 TABLET (500 MG TOTAL) BY MOUTH 2 (TWO) TIMES DAILY. 60 tablet 2  . metoprolol (LOPRESSOR) 50 MG tablet Take one and one-half tablets twice a day (Patient taking differently: Take 50 mg by mouth at bedtime. ) 90 tablet 11  . ondansetron (ZOFRAN) 4 MG tablet TAKE 1 TABLET BY MOUTH EVERY 6 HOURS AS NEEDED FOR NAUSEA 30 tablet 0  . simvastatin (ZOCOR) 20 MG tablet Take 1 tablet (20 mg total) by mouth daily. Overdue for yearly physical w/Labs must see MD for refills 30 tablet 0  . trimethoprim (TRIMPEX) 100 MG tablet Take 100 mg by mouth daily.   1  . venlafaxine XR (EFFEXOR-XR) 37.5 MG 24 hr capsule Take 2 capsules (75 mg total) by mouth daily with breakfast. 60 capsule 1  . VICTOZA 18 MG/3ML SOPN Inject 0.2 mLs (1.2 mg total) into the skin daily. Inject once daily at the same time 2 pen 3   No current facility-administered medications on file prior to visit.    Review of Systems  Constitutional: Negative for unusual diaphoresis or night sweats HENT: Negative for ear swelling or discharge Eyes: Negative for worsening visual haziness  Respiratory: Negative for choking and stridor.   Gastrointestinal: Negative for distension or worsening eructation Genitourinary: Negative for retention or change in urine volume.  Musculoskeletal: Negative for other MSK pain or swelling Skin: Negative for color change and worsening wound Neurological: Negative for tremors and numbness other than noted  Psychiatric/Behavioral: Negative for decreased concentration or agitation other than above   All other system neg per pt    Objective:   Physical Exam BP  124/68   Pulse (!) 101   Temp 98.4 F (36.9 C) (Oral)   Ht 5\' 6"  (1.676 m)   Wt 285 lb (129.3 kg)   SpO2 99%   BMI 46.00 kg/m  VS noted,  Constitutional: Pt appears in no apparent distress HENT: Head: NCAT.  Right Ear: External ear normal.  Left Ear: External ear normal.  Eyes: . Pupils are equal, round, and reactive to light. Conjunctivae and EOM are normal Neck: Normal range of motion. Neck supple.  Cardiovascular: Normal rate and regular rhythm.   Pulmonary/Chest: Effort normal and breath sounds without rales or wheezing.  Abd:  Soft, NT, ND, + BS Neurological:  Pt is alert. Not confused , motor grossly intact Skin: Skin is warm. No rash, large 3+ RLE edema to hip, trace LLE Psychiatric: Pt behavior is normal. No agitation.  No other exam findings Lab Results  Component Value Date   WBC 7.9 03/28/2016   HGB 9.9 (L) 03/28/2016   HCT 32.1 (L) 03/28/2016   PLT 240 03/28/2016   GLUCOSE 265 (H) 11/11/2016   CHOL 212 (H) 02/12/2016   TRIG 113.0 02/12/2016   HDL 86.20 02/12/2016   LDLDIRECT 156.6 01/07/2011   LDLCALC 103 (H) 02/12/2016   ALT 16 11/11/2016   AST 14 11/11/2016   NA 137 11/11/2016   K 4.7 11/11/2016   CL 109 11/11/2016   CREATININE 1.67 (H) 11/11/2016   BUN 28 (H) 11/11/2016   CO2 21 11/11/2016   TSH 1.33 11/11/2016   INR 1.24 03/28/2016   HGBA1C 6.9 (H) 11/11/2016   MICROALBUR 2.1 (H) 11/11/2016       Assessment & Plan:

## 2016-12-27 NOTE — Assessment & Plan Note (Signed)
Etiology unclear, diff includes venous insufficiency, lymphedema or even RSD, for vascular surgury referral

## 2016-12-27 NOTE — Patient Instructions (Addendum)
Please continue all other medications as before, and refills have been done if requested.  Please have the pharmacy call with any other refills you may need.  Please continue your efforts at being more active, low cholesterol diet, and weight control.  Please keep your appointments with your specialists as you may have planned  You will be contacted regarding the referral for: Vascular surgury  Please return in 6 months, or sooner if needed

## 2016-12-31 DIAGNOSIS — R350 Frequency of micturition: Secondary | ICD-10-CM | POA: Diagnosis not present

## 2016-12-31 DIAGNOSIS — N3010 Interstitial cystitis (chronic) without hematuria: Secondary | ICD-10-CM | POA: Diagnosis not present

## 2017-01-01 DIAGNOSIS — B9620 Unspecified Escherichia coli [E. coli] as the cause of diseases classified elsewhere: Secondary | ICD-10-CM | POA: Diagnosis not present

## 2017-01-01 DIAGNOSIS — R109 Unspecified abdominal pain: Secondary | ICD-10-CM | POA: Diagnosis not present

## 2017-01-01 DIAGNOSIS — N390 Urinary tract infection, site not specified: Secondary | ICD-10-CM | POA: Diagnosis not present

## 2017-01-02 ENCOUNTER — Other Ambulatory Visit: Payer: Self-pay | Admitting: Endocrinology

## 2017-01-05 ENCOUNTER — Other Ambulatory Visit: Payer: Self-pay | Admitting: Internal Medicine

## 2017-01-06 ENCOUNTER — Other Ambulatory Visit: Payer: Self-pay

## 2017-01-08 ENCOUNTER — Other Ambulatory Visit: Payer: Self-pay | Admitting: Gynecology

## 2017-01-17 DIAGNOSIS — N183 Chronic kidney disease, stage 3 (moderate): Secondary | ICD-10-CM | POA: Diagnosis not present

## 2017-01-17 DIAGNOSIS — I10 Essential (primary) hypertension: Secondary | ICD-10-CM | POA: Diagnosis not present

## 2017-01-17 DIAGNOSIS — D631 Anemia in chronic kidney disease: Secondary | ICD-10-CM | POA: Diagnosis not present

## 2017-01-20 DIAGNOSIS — N3010 Interstitial cystitis (chronic) without hematuria: Secondary | ICD-10-CM | POA: Diagnosis not present

## 2017-01-20 DIAGNOSIS — R300 Dysuria: Secondary | ICD-10-CM | POA: Diagnosis not present

## 2017-01-21 ENCOUNTER — Encounter: Payer: Self-pay | Admitting: Endocrinology

## 2017-01-21 DIAGNOSIS — E113293 Type 2 diabetes mellitus with mild nonproliferative diabetic retinopathy without macular edema, bilateral: Secondary | ICD-10-CM | POA: Diagnosis not present

## 2017-01-21 DIAGNOSIS — H43813 Vitreous degeneration, bilateral: Secondary | ICD-10-CM | POA: Diagnosis not present

## 2017-01-21 DIAGNOSIS — H2513 Age-related nuclear cataract, bilateral: Secondary | ICD-10-CM | POA: Diagnosis not present

## 2017-01-21 LAB — HM DIABETES EYE EXAM

## 2017-01-23 DIAGNOSIS — M47816 Spondylosis without myelopathy or radiculopathy, lumbar region: Secondary | ICD-10-CM | POA: Diagnosis not present

## 2017-01-25 DIAGNOSIS — R935 Abnormal findings on diagnostic imaging of other abdominal regions, including retroperitoneum: Secondary | ICD-10-CM | POA: Diagnosis not present

## 2017-01-25 DIAGNOSIS — R1032 Left lower quadrant pain: Secondary | ICD-10-CM | POA: Insufficient documentation

## 2017-01-25 DIAGNOSIS — N183 Chronic kidney disease, stage 3 (moderate): Secondary | ICD-10-CM | POA: Insufficient documentation

## 2017-01-25 DIAGNOSIS — E039 Hypothyroidism, unspecified: Secondary | ICD-10-CM | POA: Diagnosis not present

## 2017-01-25 DIAGNOSIS — I129 Hypertensive chronic kidney disease with stage 1 through stage 4 chronic kidney disease, or unspecified chronic kidney disease: Secondary | ICD-10-CM | POA: Diagnosis not present

## 2017-01-25 DIAGNOSIS — E1122 Type 2 diabetes mellitus with diabetic chronic kidney disease: Secondary | ICD-10-CM | POA: Insufficient documentation

## 2017-01-25 DIAGNOSIS — I2510 Atherosclerotic heart disease of native coronary artery without angina pectoris: Secondary | ICD-10-CM | POA: Diagnosis not present

## 2017-01-25 DIAGNOSIS — Z79899 Other long term (current) drug therapy: Secondary | ICD-10-CM | POA: Insufficient documentation

## 2017-01-25 DIAGNOSIS — R339 Retention of urine, unspecified: Secondary | ICD-10-CM | POA: Diagnosis present

## 2017-01-25 DIAGNOSIS — Z7982 Long term (current) use of aspirin: Secondary | ICD-10-CM | POA: Insufficient documentation

## 2017-01-25 DIAGNOSIS — Z794 Long term (current) use of insulin: Secondary | ICD-10-CM | POA: Diagnosis not present

## 2017-01-25 DIAGNOSIS — Z87891 Personal history of nicotine dependence: Secondary | ICD-10-CM | POA: Insufficient documentation

## 2017-01-26 ENCOUNTER — Emergency Department (HOSPITAL_COMMUNITY): Payer: Medicare Other

## 2017-01-26 ENCOUNTER — Encounter (HOSPITAL_COMMUNITY): Payer: Self-pay | Admitting: Emergency Medicine

## 2017-01-26 ENCOUNTER — Emergency Department (HOSPITAL_COMMUNITY)
Admission: EM | Admit: 2017-01-26 | Discharge: 2017-01-26 | Disposition: A | Discharge status: Home / Self Care | Payer: Medicare Other | Attending: Emergency Medicine | Admitting: Emergency Medicine

## 2017-01-26 DIAGNOSIS — R1032 Left lower quadrant pain: Secondary | ICD-10-CM | POA: Diagnosis not present

## 2017-01-26 DIAGNOSIS — R935 Abnormal findings on diagnostic imaging of other abdominal regions, including retroperitoneum: Secondary | ICD-10-CM | POA: Diagnosis not present

## 2017-01-26 LAB — URINALYSIS, ROUTINE W REFLEX MICROSCOPIC
Glucose, UA: NEGATIVE mg/dL
Hgb urine dipstick: NEGATIVE
Ketones, ur: NEGATIVE mg/dL
Leukocytes, UA: NEGATIVE
Nitrite: NEGATIVE
Protein, ur: NEGATIVE mg/dL
Specific Gravity, Urine: 1.021 (ref 1.005–1.030)
pH: 5 (ref 5.0–8.0)

## 2017-01-26 NOTE — ED Triage Notes (Signed)
Pt reports not being able to urinate since 01/22/17 she has not been able to urinate.

## 2017-01-26 NOTE — ED Notes (Signed)
Patient is alert and oriented x3.  She was given DC instructions and follow up visit instructions.  Patient gave verbal understanding. She was DC ambulatory under her own power to home.  V/S stable.  He was not showing any signs of distress on DC 

## 2017-01-26 NOTE — ED Provider Notes (Signed)
[place] DEPT Provider Note   CSN: [id number] Arrival date & time: 01/25/17  2355  By signing my name below, I, Julien Nordmann, attest that this documentation has been prepared under the direction and in the presence of Everlene Balls, MD.  Electronically Signed: Julien Nordmann, ED Scribe. 01/26/17. 2:39 AM.    History   Chief Complaint Chief Complaint  Patient presents with  . Urinary Retention    The history is provided by the patient. No language interpreter was used.   HPI Comments: [name] is a 66 y.o. female who CKD, CAD, DM, HLD, HTN, gastroparesis, hypothyroidism presents to the Emergency Department presenting with urinary retention x 4 days. She reports associated LLQ abdominal pain that radiates into her left flank. Pt was seen by her Urologist at [facility] ~ 8 days ago. She had a urinalysis performed and was told she had bacteria in her urine, but no infection was present. Pt states she was told she possibly has intestinal cystitis but has not followed up since. She was started on a dose of an unknown antibiotic which she has taken one of. Pt was also given a numbing medication in her bladder and states she hasn't been able to urinate since. Pt has an abdominal surgical hx of total hysterectomy and cholecystectomy. She denies any other complaints.   When in the ED, pt had 400 cc of urine removed from her bladder s/p in and out catheter.  Past Medical History:  Diagnosis Date  . Anxiety   . Arthritis   . CERVICAL RADICULOPATHY, LEFT   . CKD (chronic kidney disease)   . COMMON MIGRAINE   . CORONARY ARTERY DISEASE   . DEPRESSION   . Diabetes mellitus, type II ([place])   . DIVERTICULOSIS-COLON   . Gastric ulcer 04/2008  . Gastroparesis   . GERD (gastroesophageal reflux disease)   . Hiatal hernia   . Hyperlipidemia   . Hypertension   . Hypothyroidism   . INSOMNIA-SLEEP DISORDER-UNSPEC   . Iron deficiency anemia   . Wears glasses     Patient Active  Problem List   Diagnosis Date Noted  . Right leg swelling 12/27/2016  . Leg pain, lateral, right 11/05/2016  . Radiculopathy 04/03/2016  . Hypersomnolence 11/23/2015  . Right sided abdominal pain 07/25/2015  . Malaise and fatigue 11/05/2014  . Fracture of fifth metacarpal bone of right hand 07/08/2014  . Right cervical radiculopathy 06/27/2014  . Chronic interstitial cystitis 05/23/2014  . Osteoarthritis of Rosenberg joint of thumb 04/11/2014  . De Quervain's tenosynovitis, left 03/14/2014  . CKD (chronic kidney disease) 03/03/2014  . Lower back pain 03/03/2014  . Ingrown nail 03/03/2014  . Peripheral edema 02/23/2014  . Lateral epicondylitis of right elbow 01/31/2014  . Neck pain on left side 11/19/2013  . Abdominal discomfort 10/12/2012  . Right lumbar radiculopathy 05/11/2012  . Peripheral neuropathy ([place]) 05/11/2012  . Vertigo 03/29/2012  . Headache(784.0) 03/10/2012  . Lumbar radiculopathy 10/24/2011  . 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 Right leg weakness 04/04/2011  . Hematochezia 01/07/2011  . Preventative health care 01/07/2011  . CHOLELITHIASIS 05/04/2009  . GERD 04/26/2009  . Gastroparesis 04/26/2009  . ULCER-GASTRIC 03/15/2009  . DIVERTICULOSIS-COLON 03/15/2009  . Cervical radiculopathy 10/28/2008  . MENOPAUSAL DISORDER 06/29/2008  . Pain in joint, shoulder region 10/12/2007  . INSOMNIA-SLEEP DISORDER-UNSPEC 09/01/2007  . DEPRESSION 09/01/2007  . ANEMIA-NOS 08/13/2007  . COMMON MIGRAINE 08/13/2007  . 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 Diabetes ([place]) 05/06/2007  . HYPERLIPIDEMIA  05/06/2007  . ANXIETY 05/06/2007  . Essential hypertension 05/06/2007  . CORONARY ARTERY DISEASE 05/06/2007    Past Surgical History:  Procedure Laterality Date  . ABDOMINAL HYSTERECTOMY    . BACK SURGERY  03/2016  . CHOLECYSTECTOMY  06/2009  . COLONOSCOPY    . ESOPHAGOGASTRODUODENOSCOPY    . EYE SURGERY Bilateral    lasik  . Gastric Wedge resection lipoma  11/2007    x2 with laparotomy and gastrostomy  . Left knee replacement    . Rigth knee replacement with revision  04/2008   Dr. Berenice Primas  . ROTATOR CUFF REPAIR Left 01/2009  . s/p bladder surgury  09/2009   Dr. Terance Hart    OB History    Gravida Para Term Preterm AB Living   3 3       3    SAB TAB Ectopic Multiple Live Births                   Home Medications    Prior to Admission medications   Medication Sig Start Date End Date Taking? Authorizing Provider  amitriptyline (ELAVIL) 10 MG tablet Take 10 mg by mouth at bedtime.  07/26/15  Yes Historical Provider, MD  amLODipine-valsartan (EXFORGE) 10-320 MG tablet Take 1 tablet by mouth daily. Patient taking differently: Take 1 tablet by mouth every evening.  02/27/16  Yes Biagio Borg, MD  aspirin 81 MG EC tablet Take 81 mg by mouth every evening.    Yes Historical Provider, MD  ELMIRON 100 MG capsule Take 100 mg by mouth 2 (two) times daily.  07/20/13  Yes Historical Provider, MD  estradiol (ESTRACE) 2 MG tablet TAKE 1 TABLET (2 MG TOTAL) BY MOUTH DAILY. 08/05/16  Yes Terrance Mass, MD  ferrous sulfate 325 (65 FE) MG tablet Take 1 tablet by mouth 2 (two) times daily. 01/21/17  Yes Historical Provider, MD  FLUoxetine (PROZAC) 20 MG capsule TAKE 1 CAPSULE (20 MG TOTAL) BY MOUTH DAILY. Patient taking differently: Take 20 mg by mouth every evening.  05/27/16  Yes Biagio Borg, MD  gabapentin (NEURONTIN) 600 MG tablet Take 600-1,200 mg by mouth 2 (two) times daily.  08/09/14  Yes Historical Provider, MD  glimepiride (AMARYL) 1 MG tablet TAKE 1 TABLET BY MOUTH WITH SUPPER ONCE A DAY PER DR. Dwyane Dee Patient taking differently: TAKE 1 MG BY MOUTH WITH SUPPER ONCE A DAY PER DR. Dwyane Dee 09/06/15  Yes Elayne Snare, MD  glucose blood (ACCU-CHEK AVIVA PLUS) test strip Use to test blood sugar 3 times daily- Dx code E11.9 11/13/16  Yes Elayne Snare, MD  HYDROcodone-acetaminophen (NORCO/VICODIN) 5-325 MG tablet Take 1 tablet by mouth every 6 (six) hours as needed for  moderate pain or severe pain.  12/23/16  Yes Historical Provider, MD  insulin aspart (NOVOLOG FLEXPEN) 100 UNIT/ML FlexPen Inject 10 Units into the skin 3 (three) times daily with meals.   Yes Historical Provider, MD  lansoprazole (PREVACID) 15 MG capsule Take 15 mg by mouth every evening.    Yes Historical Provider, MD  levothyroxine (SYNTHROID, LEVOTHROID) 88 MCG tablet TAKE 1 TABLET (88 MCG TOTAL) BY MOUTH DAILY. 01/02/17  Yes Elayne Snare, MD  linaclotide [facility]) 72 MCG capsule Take 1 capsule (72 mcg total) by mouth daily before breakfast. Patient taking differently: Take 72 mcg by mouth daily as needed (constipation).  11/21/16  Yes Ladene Artist, MD  meloxicam (MOBIC) 15 MG tablet Take 15 mg by mouth every evening.  12/23/16  Yes Historical Provider, MD  ondansetron (ZOFRAN) 4 MG tablet TAKE 1 TABLET BY MOUTH EVERY 6 HOURS AS NEEDED FOR NAUSEA 10/11/16  Yes Biagio Borg, MD  simvastatin (ZOCOR) 20 MG tablet TAKE ONE TABLET BY MOUTH DAILY. OVERDUE FOR YEARLY PHYSICAL WITH LABS MUST SEE M.D. FOR REFILLS Patient taking differently: Take 20 mg by mouth each evening 01/06/17  Yes Biagio Borg, MD  trimethoprim (TRIMPEX) 100 MG tablet Take 100 mg by mouth every evening.  08/08/14  Yes Historical Provider, MD  venlafaxine XR (EFFEXOR-XR) 37.5 MG 24 hr capsule Take 2 capsules (75 mg total) by mouth daily with breakfast. 12/03/16  Yes Lyndal Pulley, DO  VICTOZA 18 MG/3ML SOPN Inject 0.2 mLs (1.2 mg total) into the skin daily. Inject once daily at the same time 08/15/16  Yes Elayne Snare, MD  zolpidem (AMBIEN) 5 MG tablet Take 5 mg by mouth at bedtime as needed for sleep.  11/19/16  Yes Historical Provider, MD  AMBULATORY NON FORMULARY MEDICATION Domperidone 10 mg    Take one tablet by mouth twice a day before meals Patient not taking: Reported on 01/26/2017 08/31/12   Ladene Artist, MD  cloNIDine (CATAPRES) 0.1 MG tablet TAKE 1 TABLET (0.1 MG TOTAL) BY MOUTH 2 (TWO) TIMES DAILY. Patient not taking: Reported on  01/26/2017 10/16/16   Elayne Snare, MD  isosorbide dinitrate (ISORDIL) 10 MG tablet TAKE ONE TABLET BY MOUTH DAILY Patient not taking: Reported on 01/26/2017 08/01/14   Biagio Borg, MD  metFORMIN (GLUCOPHAGE-XR) 500 MG 24 hr tablet TAKE 1 TABLET (500 MG TOTAL) BY MOUTH 2 (TWO) TIMES DAILY. Patient not taking: Reported on 01/26/2017 04/30/16   Elayne Snare, MD  metoprolol (LOPRESSOR) 50 MG tablet Take one and one-half tablets twice a day Patient not taking: Reported on 01/26/2017 05/31/13   Biagio Borg, MD    Family History Family History  Problem Relation Age of Onset  . Diabetes Sister     x 3  . Heart disease Sister     x2  . Diabetes Brother     x3  . Heart disease Brother     x2  . Coronary artery disease Other     female 1st degree  . Hypertension Other   . Colon cancer Neg Hx     Social History Social History  Substance Use Topics  . Smoking status: Former Research scientist (life sciences)  . Smokeless tobacco: Never Used     Comment: quit 30 years ago  . Alcohol use No     Allergies   Hydrocodone; Ciprofloxacin; and Penicillins   Review of Systems Review of Systems  All other systems reviewed and are negative for acute changes except as noted in the HPI.  Physical Exam Updated Vital Signs BP (!) 113/57   Pulse 87   Temp 97.9 F (36.6 C) (Oral)   Resp 18   Ht 5\' 6"  (1.676 m)   Wt 245 lb 3.2 oz (111.2 kg)   SpO2 98%   BMI 39.58 kg/m   Physical Exam  Constitutional: She is oriented to person, place, and time. She appears well-developed and well-nourished. No distress.  HENT:  Head: Normocephalic and atraumatic.  Nose: Nose normal.  Mouth/Throat: Oropharynx is clear and moist. No oropharyngeal exudate.  Eyes: Conjunctivae and EOM are normal. Pupils are equal, round, and reactive to light. No scleral icterus.  Neck: Normal range of motion. Neck supple. No JVD present. No tracheal deviation present. No thyromegaly present.  Cardiovascular: Normal rate, regular rhythm and normal heart  sounds.  Exam reveals no gallop and no friction rub.   No murmur heard. Pulmonary/Chest: Effort normal and breath sounds normal. No respiratory distress. She has no wheezes. She exhibits no tenderness.  Abdominal: Soft. Bowel sounds are normal. She exhibits no distension and no mass. There is no tenderness. There is no rebound and no guarding.  Musculoskeletal: Normal range of motion. She exhibits no edema or tenderness.  Lymphadenopathy:    She has no cervical adenopathy.  Neurological: She is alert and oriented to person, place, and time. No cranial nerve deficit. She exhibits normal muscle tone.  Skin: Skin is warm and dry. No rash noted. No erythema. No pallor.  Nursing note and vitals reviewed.    ED Treatments / Results  DIAGNOSTIC STUDIES: Oxygen Saturation is 100% on RA, normal by my interpretation.  COORDINATION OF CARE:  1:13 AM Discussed treatment plan with pt at bedside and pt agreed to plan.  Labs (all labs ordered are listed, but only abnormal results are displayed) Labs Reviewed  URINALYSIS, ROUTINE W REFLEX MICROSCOPIC - Abnormal; Notable for the following:       Result Value   Bilirubin Urine SMALL (*)    All other components within normal limits  URINE CULTURE    EKG  EKG Interpretation None       Radiology Ct Renal Stone Study  Result Date: 01/26/2017 CLINICAL DATA:  Inability to void for 3 days EXAM: CT ABDOMEN AND PELVIS WITHOUT CONTRAST TECHNIQUE: Multidetector CT imaging of the abdomen and pelvis was performed following the standard protocol without IV contrast. COMPARISON:  07/25/2015 FINDINGS: Lower chest: Lung bases demonstrate no acute consolidation or effusion. The heart appears slightly enlarged. Hepatobiliary: No focal liver abnormality is seen. Status post cholecystectomy. No biliary dilatation. Pancreas: Unremarkable. No pancreatic ductal dilatation or surrounding inflammatory changes. Spleen: Normal in size without focal abnormality.  Adrenals/Urinary Tract: Adrenal glands are within normal limits. No calcified stones within the kidneys. No hydronephrosis. The bladder is nearly empty. Stomach/Bowel: Postsurgical changes of the stomach. No dilated small bowel. Moderate to large stool in the colon. Normal appendix. Vascular/Lymphatic: Aortic atherosclerosis. No enlarged abdominal or pelvic lymph nodes. Reproductive: Status post hysterectomy. No adnexal masses. Other: No free air or free fluid. Small fat in the umbilicus. Fat in the left inguinal region. Musculoskeletal: Postsurgical changes at L4-L5 with posterior stabilization rod and fixating screws, new compared with 2016. Increased endplate sclerosis at W6-O0. IMPRESSION: 1. Negative for nephrolithiasis, hydronephrosis or ureteral stone. The bladder is nearly empty. 2. Postsurgical changes at L4-L5, new compared with 2016 CT. Electronically Signed   By: Donavan Foil M.D.   On: 01/26/2017 03:07    Procedures Procedures (including critical care time)  Medications Ordered in ED Medications - No data to display   Initial Impression / Assessment and Plan / ED Course  I have reviewed the triage vital signs and the nursing notes.  Pertinent labs & imaging results that were available during my care of the patient were reviewed by me and considered in my medical decision making (see chart for details).      Patient presents to the ED for urinary retension.  Cath reveals only 400cc and UA is neg for infection. Culture sent.    Ua is neg.  CT neg for kidney stones or masses. She was advised to fu with PCP within 3 days for further diagnosis.  She demonstrates good understanding of the plan. She appears well and in NAD.  VS are normal. Patient safe  for DC.   Final Clinical Impressions(s) / ED Diagnoses   Final diagnoses:  Left lower quadrant pain   I personally performed the services described in this documentation, which was scribed in my presence. The recorded information has  been reviewed and is accurate.    New Prescriptions New Prescriptions   No medications on file     Everlene Balls, MD 01/26/17 [phone]

## 2017-01-26 NOTE — ED Notes (Signed)
Pt hasn't been able to void since Thursday with regular fluid intake. Pt was dx with a UTI on Tuesday and has two more days of antibiotics.

## 2017-01-26 NOTE — ED Notes (Signed)
In and Out completed by this RN. Helen NT assisting.

## 2017-01-26 NOTE — ED Notes (Signed)
Bladder scan showing 70ml at this time.

## 2017-01-27 ENCOUNTER — Encounter: Payer: Medicare Other | Admitting: Gastroenterology

## 2017-01-27 DIAGNOSIS — R300 Dysuria: Secondary | ICD-10-CM | POA: Diagnosis not present

## 2017-01-27 DIAGNOSIS — N3010 Interstitial cystitis (chronic) without hematuria: Secondary | ICD-10-CM | POA: Diagnosis not present

## 2017-01-27 LAB — URINE CULTURE: Culture: <10000 — AB

## 2017-01-30 DIAGNOSIS — M47816 Spondylosis without myelopathy or radiculopathy, lumbar region: Secondary | ICD-10-CM | POA: Diagnosis not present

## 2017-02-04 ENCOUNTER — Other Ambulatory Visit: Payer: Self-pay | Admitting: Internal Medicine

## 2017-02-04 ENCOUNTER — Other Ambulatory Visit: Payer: Self-pay | Admitting: Endocrinology

## 2017-02-05 DIAGNOSIS — M47816 Spondylosis without myelopathy or radiculopathy, lumbar region: Secondary | ICD-10-CM | POA: Diagnosis not present

## 2017-02-06 DIAGNOSIS — N3010 Interstitial cystitis (chronic) without hematuria: Secondary | ICD-10-CM | POA: Diagnosis not present

## 2017-02-07 ENCOUNTER — Encounter: Payer: Self-pay | Admitting: Gynecology

## 2017-02-07 ENCOUNTER — Ambulatory Visit (INDEPENDENT_AMBULATORY_CARE_PROVIDER_SITE_OTHER): Payer: Medicare Other | Admitting: Gynecology

## 2017-02-07 VITALS — BP 128/80 | Ht 66.0 in | Wt 285.0 lb

## 2017-02-07 DIAGNOSIS — Z79890 Hormone replacement therapy (postmenopausal): Secondary | ICD-10-CM

## 2017-02-07 DIAGNOSIS — Z780 Asymptomatic menopausal state: Secondary | ICD-10-CM | POA: Diagnosis not present

## 2017-02-07 DIAGNOSIS — Z01419 Encounter for gynecological examination (general) (routine) without abnormal findings: Secondary | ICD-10-CM | POA: Diagnosis not present

## 2017-02-07 DIAGNOSIS — R232 Flushing: Secondary | ICD-10-CM

## 2017-02-07 MED ORDER — ESTRADIOL ACETATE 0.1 MG/24HR VA RING: 1.0000 | VAGINAL_RING | VAGINAL | 4 refills | Status: DC

## 2017-02-07 MED ORDER — ESTRADIOL 2 MG VA RING: 2.0000 mg | VAGINAL_RING | VAGINAL | 12 refills | Status: DC

## 2017-02-07 NOTE — Progress Notes (Signed)
[name] [birth year] [id number]   History:    66 y.o.  for annual gyn exam who was seen the office for the first time as a new patient in 2017 referred by her PCP Dr. Cathlean Cower is a result of her severe vasomotor symptoms. She had been on Estrace 1 mg daily and was increased to 2 mg daily with the addition of Peppermint oil to apply behind her ear when necessary. She states she still is having hot flashes. He is treating her for hypothyroidism and diabetes. Patient many years ago at another facility had a hysterectomy with ovarian conservation for benign pathology. She reports no past history of any abnormal Pap smears in the past.  Past medical history,surgical history, family history and social history were all reviewed and documented in the EPIC chart.  Gynecologic History No LMP recorded. Patient has had a hysterectomy. Contraception: status post hysterectomy Last Pap: Many years ago. Results were: normal Last mammogram: 2011. Results were: normal  Obstetric History OB History  Gravida Para Term Preterm AB Living  3 3       3   SAB TAB Ectopic Multiple Live Births               # Outcome Date GA Lbr Len/2nd Weight Sex Delivery Anes PTL Lv  3 Para           2 Para           1 Para                ROS: A ROS was performed and pertinent positives and negatives are included in the history.  GENERAL: No fevers or chills. HEENT: No change in vision, no earache, sore throat or sinus congestion. NECK: No pain or stiffness. CARDIOVASCULAR: No chest pain or pressure. No palpitations. PULMONARY: No shortness of breath, cough or wheeze. GASTROINTESTINAL: No abdominal pain, nausea, vomiting or diarrhea, melena or bright red blood per rectum. GENITOURINARY: No urinary frequency, urgency, hesitancy or dysuria. MUSCULOSKELETAL: No joint or muscle pain, no back pain, no recent trauma. DERMATOLOGIC: No rash, no itching, no lesions. ENDOCRINE: No polyuria, polydipsia, no heat or cold  intolerance. No recent change in weight. HEMATOLOGICAL: No anemia or easy bruising or bleeding. NEUROLOGIC: No headache, seizures, numbness, tingling or weakness. PSYCHIATRIC: No depression, no loss of interest in normal activity or change in sleep pattern.     Exam: chaperone present  BP 128/80   Ht 5\' 6"  (1.676 m)   Wt 285 lb (129.3 kg)   BMI 46.00 kg/m   Body mass index is 46 kg/m.  General appearance : Well developed well nourished female. No acute distress HEENT: Eyes: no retinal hemorrhage or exudates,  Neck supple, trachea midline, no carotid bruits, no thyroidmegaly Lungs: Clear to auscultation, no rhonchi or wheezes, or rib retractions  Heart: Regular rate and rhythm, no murmurs or gallops Breast:Examined in sitting and supine position were symmetrical in appearance, no palpable masses or tenderness,  no skin retraction, no nipple inversion, no nipple discharge, no skin discoloration, no axillary or supraclavicular lymphadenopathy Abdomen: no palpable masses or tenderness, no rebound or guarding Extremities: no edema or skin discoloration or tenderness  Pelvic:  Bartholin, Urethra, Skene Glands: Within normal limits             Vagina: No gross lesions or discharge  Cervix: Absent  Uterus  absent  Adnexa  Without masses or tenderness  Anus and perineum  normal   Rectovaginal  normal sphincter tone without palpated masses or tenderness             Hemoccult PCP provides     Assessment/Plan:  66 y.o. female for annual exam with persistent vasomotor symptoms last year we increase her Estrace 1 mg up to 2 mg daily. We are going to discontinue this and start her on a vaginal Femring to change every 3 months and she can continue to use the Peppermint Oil  when necessary. I've also given her information that she can go online and Relizen (nutritional supplement for menopausal symptoms) and she can take one tablet twice a day as well. We cannot go any higher on her estrogen  requirement. She'll need to touch base with her PCP in 19 many to adjust her thyroid medication. Pap smear not indicated today. Patient was provided with requisition to schedule her overdue mammogram. Her colonoscopy was reportedly normal in 2013. We discussed importance of calcium vitamin D and weightbearing exercises for osteoporosis prevention. Patient will need a bone density study in December 2018.   An additional 15 mass was spent discussing different forms of hormonal replacement therapy potential risks benefits and pros and cons to include DVT, pulmonary embolism as well as breast cancer. Literature information was provided.  Terrance Mass MD, 3:03 PM 02/07/2017

## 2017-02-07 NOTE — Patient Instructions (Addendum)
Estradiol vaginal ring (Femring) What is this medicine? ESTRADIOL (es tra DYE ole) vaginal ring is an insert that contains a female hormone. This medicine helps relieve symptoms of vaginal irritation and dryness that occurs in some women during menopause. This medicine can also help relieve hot flashes. This medicine may be used for other purposes; ask your health care provider or pharmacist if you have questions. COMMON BRAND NAME(S): Femring What should I tell my health care provider before I take this medicine? They need to know if you have any of these conditions: -abnormal vaginal bleeding -blood vessel disease or blood clots -breast, cervical, endometrial, ovarian, liver, or uterine cancer -dementia -diabetes -gallbladder disease -heart disease or recent heart attack -high blood pressure -high cholesterol -high level of calcium in the blood -hysterectomy -kidney disease -liver disease -migraine headaches -protein C deficiency -protein S deficiency -stroke -systemic lupus erythematosus (SLE) -tobacco smoker -an unusual or allergic reaction to estrogens, other hormones, medicines, foods, dyes, or preservatives -pregnant or trying to get pregnant -breast-feeding How should I use this medicine? This medicine may be inserted by you or your physician. Follow the directions that are included with your prescription. If you are unsure how to insert the ring, contact your doctor or health care professional. The vaginal ring should remain in place for 90 days. After 90 days you should replace your old ring and insert a new one. Do not stop using except on the advice of your doctor or health care professional. Contact your pediatrician regarding the use of this medicine in children. Special care may be needed. A patient package insert for the product will be given with each prescription and refill. Read this sheet carefully each time. The sheet may change frequently. Overdosage: If you  think you have taken too much of this medicine contact a poison control center or emergency room at once. NOTE: This medicine is only for you. Do not share this medicine with others. What if I miss a dose? If you miss a dose, use it as soon as you can. If it is almost time for your next dose, use only that dose. Do not use double or extra doses. What may interact with this medicine? Do not take this medicine with any of the following medications: -aromatase inhibitors like aminoglutethimide, anastrozole, exemestane, letrozole, testolactone, vorozole This medicine may also interact with the following medications: -carbamazepine -certain antibiotics used to treat infections -certain barbiturates used for inducing sleep or treating seizures -grapefruit juice -medicines for fungus infections like itraconazole and ketoconazole -raloxifene or tamoxifen -rifabutin, rifampin, or rifapentine -ritonavir -St. John's Wort This list may not describe all possible interactions. Give your health care provider a list of all the medicines, herbs, non-prescription drugs, or dietary supplements you use. Also tell them if you smoke, drink alcohol, or use illegal drugs. Some items may interact with your medicine. What should I watch for while using this medicine? Visit your doctor or health care professional for regular checks on your progress. You will need a regular breast and pelvic exam and Pap smear while on this medicine. You should also discuss the need for regular mammograms with your health care professional, and follow his or her guidelines for these tests. This medicine can make your body retain fluid, making your fingers, hands, or ankles swell. Your blood pressure can go up. Contact your doctor or health care professional if you feel you are retaining fluid. If you have any reason to think you are pregnant, stop taking this medicine  right away and contact your doctor or health care professional. Smoking  increases the risk of getting a blood clot or having a stroke while you are taking this medicine, especially if you are more than 67 years old. You are strongly advised not to smoke. If you wear contact lenses and notice visual changes, or if the lenses begin to feel uncomfortable, consult your eye doctor or health care professional. This medicine can increase the risk of developing a condition (endometrial hyperplasia) that may lead to cancer of the lining of the uterus. Taking progestins, another hormone drug, with this medicine lowers the risk of developing this condition. Therefore, if your uterus has not been removed (by a hysterectomy), your doctor may prescribe a progestin for you to take together with your estrogen. You should know, however, that taking estrogens with progestins may have additional health risks. You should discuss the use of estrogens and progestins with your health care professional to determine the benefits and risks for you. If you are going to have surgery, you may need to stop taking this medicine. Consult your health care professional for advice before you schedule the surgery. You may bathe or participate in other activities while using this medicine. You do not need to remove the vaginal ring during sexual or other activities unless you are more comfortable doing so. Within the 90-day dosage period, you may remove the vaginal ring, rinse it with clean lukewarm (not hot or boiling) water, and re-insert the ring as needed. What side effects may I notice from receiving this medicine? Side effects that you should report to your doctor or health care professional as soon as possible: -allergic reactions like skin rash, itching or hives, swelling of the face, lips, or tongue -breast tissue changes or discharge -signs and symptoms of a blood clot such as breathing problems; changes in vision; chest pain; severe, sudden headache; pain, swelling, warmth in the leg; trouble speaking;  sudden numbness or weakness of the face, arm or leg -signs and symptoms of infection like fever or chills; vomiting; diarrhea; muscle pain; dizziness; or a red, sunburn-like rash on face and body -signs and symptoms of liver injury like dark yellow or brown urine; general ill feeling or flu-like symptoms; light-colored stools; loss of appetite; nausea; right upper belly pain; unusually weak or tired; yellowing of the eyes or skin -symptoms of bowel blockage like constipation, abdominal swelling, abdominal pain, inability to pass gas or have a bowel movement -symptoms of vaginal infection like itching, irritation or unusual discharge -unusual or increased vaginal bleeding -vaginal pain or soreness, redness, swelling Side effects that usually do not require medical attention (report to your doctor or health care professional if they continue or are bothersome): -breast tenderness -fluid retention -hair loss -headache -nausea -upset stomach -vaginal spotting This list may not describe all possible side effects. Call your doctor for medical advice about side effects. You may report side effects to FDA at 1-800-FDA-1088. Where should I keep my medicine? Keep out of the reach of children. Store at room temperature between 15 and 30 degrees C (59 and 86 degrees F). Throw away any unused medicine after the expiration date. NOTE: This sheet is a summary. It may not cover all possible information. If you have questions about this medicine, talk to your doctor, pharmacist, or health care provider.  2018 Elsevier/Gold Standard (2014-07-18 15:22:23)  Bone Densitometry Bone densitometry is an imaging test that uses a special X-ray to measure the amount of calcium and other minerals  in your bones (bone density). This test is also known as a bone mineral density test or dual-energy X-ray absorptiometry (DXA). The test can measure bone density at your hip and your spine. It is similar to having a regular  X-ray. You may have this test to:  Diagnose a condition that causes weak or thin bones (osteoporosis).  Predict your risk of a broken bone (fracture).  Determine how well osteoporosis treatment is working. Tell a health care provider about:  Any allergies you have.  All medicines you are taking, including vitamins, herbs, eye drops, creams, and over-the-counter medicines.  Any problems you or family members have had with anesthetic medicines.  Any blood disorders you have.  Any surgeries you have had.  Any medical conditions you have.  Possibility of pregnancy.  Any other medical test you had within the previous 14 days that used contrast material. What are the risks? Generally, this is a safe procedure. However, problems can occur and may include the following:  This test exposes you to a very small amount of radiation.  The risks of radiation exposure may be greater to unborn children. What happens before the procedure?  Do not take any calcium supplements for 24 hours before having the test. You can otherwise eat and drink what you usually do.  Take off all metal jewelry, eyeglasses, dental appliances, and any other metal objects. What happens during the procedure?  You may lie on an exam table. There will be an X-ray generator below you and an imaging device above you.  Other devices, such as boxes or braces, may be used to position your body properly for the scan.  You will need to lie still while the machine slowly scans your body.  The images will show up on a computer monitor. What happens after the procedure? You may need more testing at a later time. This information is not intended to replace advice given to you by your health care provider. Make sure you discuss any questions you have with your health care provider. Document Released: 10/15/2004 Document Revised: 02/29/2016 Document Reviewed: 03/03/2014 Elsevier Interactive Patient Education  2017  [employer].

## 2017-02-11 DIAGNOSIS — M47816 Spondylosis without myelopathy or radiculopathy, lumbar region: Secondary | ICD-10-CM | POA: Diagnosis not present

## 2017-02-18 ENCOUNTER — Encounter: Payer: Self-pay | Admitting: Vascular Surgery

## 2017-02-19 ENCOUNTER — Encounter: Payer: Self-pay | Admitting: Gynecology

## 2017-02-20 ENCOUNTER — Other Ambulatory Visit: Payer: Self-pay

## 2017-02-20 DIAGNOSIS — M7989 Other specified soft tissue disorders: Secondary | ICD-10-CM

## 2017-02-25 ENCOUNTER — Encounter (HOSPITAL_COMMUNITY): Payer: Medicare Other

## 2017-02-25 ENCOUNTER — Ambulatory Visit: Payer: Medicare Other | Admitting: Endocrinology

## 2017-02-25 ENCOUNTER — Encounter: Payer: Medicare Other | Admitting: Vascular Surgery

## 2017-02-28 DIAGNOSIS — N3010 Interstitial cystitis (chronic) without hematuria: Secondary | ICD-10-CM | POA: Diagnosis not present

## 2017-03-06 ENCOUNTER — Other Ambulatory Visit: Payer: Self-pay | Admitting: Internal Medicine

## 2017-03-06 NOTE — Telephone Encounter (Signed)
Done erx 

## 2017-03-18 ENCOUNTER — Other Ambulatory Visit: Payer: Self-pay | Admitting: Endocrinology

## 2017-03-28 DIAGNOSIS — E039 Hypothyroidism, unspecified: Secondary | ICD-10-CM | POA: Diagnosis not present

## 2017-03-28 DIAGNOSIS — N183 Chronic kidney disease, stage 3 (moderate): Secondary | ICD-10-CM | POA: Diagnosis not present

## 2017-04-01 ENCOUNTER — Other Ambulatory Visit: Payer: Self-pay | Admitting: Cardiology

## 2017-04-01 ENCOUNTER — Other Ambulatory Visit: Payer: Self-pay | Admitting: Endocrinology

## 2017-04-01 DIAGNOSIS — N189 Chronic kidney disease, unspecified: Secondary | ICD-10-CM | POA: Diagnosis not present

## 2017-04-01 DIAGNOSIS — E669 Obesity, unspecified: Secondary | ICD-10-CM | POA: Diagnosis not present

## 2017-04-01 DIAGNOSIS — I25119 Atherosclerotic heart disease of native coronary artery with unspecified angina pectoris: Secondary | ICD-10-CM | POA: Diagnosis not present

## 2017-04-01 DIAGNOSIS — I129 Hypertensive chronic kidney disease with stage 1 through stage 4 chronic kidney disease, or unspecified chronic kidney disease: Secondary | ICD-10-CM | POA: Diagnosis not present

## 2017-04-01 DIAGNOSIS — M1990 Unspecified osteoarthritis, unspecified site: Secondary | ICD-10-CM | POA: Diagnosis not present

## 2017-04-01 DIAGNOSIS — G894 Chronic pain syndrome: Secondary | ICD-10-CM | POA: Diagnosis not present

## 2017-04-01 DIAGNOSIS — E785 Hyperlipidemia, unspecified: Secondary | ICD-10-CM | POA: Diagnosis not present

## 2017-04-01 DIAGNOSIS — R079 Chest pain, unspecified: Secondary | ICD-10-CM

## 2017-04-01 DIAGNOSIS — E119 Type 2 diabetes mellitus without complications: Secondary | ICD-10-CM | POA: Diagnosis not present

## 2017-04-01 DIAGNOSIS — E1140 Type 2 diabetes mellitus with diabetic neuropathy, unspecified: Secondary | ICD-10-CM | POA: Diagnosis not present

## 2017-04-16 ENCOUNTER — Encounter (HOSPITAL_COMMUNITY)
Admission: RE | Admit: 2017-04-16 | Discharge: 2017-04-16 | Disposition: A | Discharge status: Home / Self Care | Payer: Medicare Other | Source: Ambulatory Visit | Attending: Cardiology | Admitting: Cardiology

## 2017-04-16 ENCOUNTER — Encounter (HOSPITAL_COMMUNITY): Payer: Self-pay

## 2017-04-16 DIAGNOSIS — M5127 Other intervertebral disc displacement, lumbosacral region: Secondary | ICD-10-CM | POA: Diagnosis not present

## 2017-04-16 DIAGNOSIS — M961 Postlaminectomy syndrome, not elsewhere classified: Secondary | ICD-10-CM | POA: Diagnosis not present

## 2017-04-17 ENCOUNTER — Encounter (HOSPITAL_COMMUNITY): Admission: RE | Admit: 2017-04-17 | Payer: Medicare Other | Source: Ambulatory Visit

## 2017-04-23 ENCOUNTER — Encounter: Payer: Self-pay | Admitting: Internal Medicine

## 2017-04-23 ENCOUNTER — Ambulatory Visit (INDEPENDENT_AMBULATORY_CARE_PROVIDER_SITE_OTHER): Payer: Medicare Other | Admitting: Internal Medicine

## 2017-04-23 DIAGNOSIS — I10 Essential (primary) hypertension: Secondary | ICD-10-CM | POA: Diagnosis not present

## 2017-04-23 DIAGNOSIS — R300 Dysuria: Secondary | ICD-10-CM

## 2017-04-23 DIAGNOSIS — E119 Type 2 diabetes mellitus without complications: Secondary | ICD-10-CM

## 2017-04-23 LAB — POCT URINALYSIS DIPSTICK
Bilirubin, UA: NEGATIVE
Blood, UA: NEGATIVE
Glucose, UA: NEGATIVE
Ketones, UA: NEGATIVE
Nitrite, UA: NEGATIVE
Protein, UA: NEGATIVE
Spec Grav, UA: >=1.03 — AB (ref 1.010–1.025)
Urobilinogen, UA: 0.2 E.U./dL
pH, UA: 6 (ref 5.0–8.0)

## 2017-04-23 MED ORDER — PHENAZOPYRIDINE HCL 100 MG PO TABS: 100.0000 mg | ORAL_TABLET | Freq: Three times a day (TID) | ORAL | 0 refills | Status: DC | PRN

## 2017-04-23 MED ORDER — LEVOFLOXACIN 500 MG PO TABS: 500.0000 mg | ORAL_TABLET | Freq: Every day | ORAL | 0 refills | Status: DC

## 2017-04-23 NOTE — Patient Instructions (Signed)
Please take all new medication as prescribed - the antibiotic, and the pyridium for pain  Please continue all other medications as before, and refills have been done if requested.  Please have the pharmacy call with any other refills you may need.  Please keep your appointments with your specialists as you may have planned

## 2017-04-23 NOTE — Progress Notes (Signed)
Subjective:    Patient ID: [name], female    DOB: 1950/11/30, 66 y.o.   MRN: [id number]  HPI  Here with 2 days onset dysuria and urinary requency with low abd pain, feels bad overall with weakness, but no urgency, flank pain, hematuria or n/v, fever, chills.  Pt denies new neurological symptoms such as new headache, or facial or extremity weakness or numbness   Pt denies polydipsia, polyuria.  Pt denies chest pain, increased sob or doe, wheezing, orthopnea, PND, increased LE swelling, palpitations, dizziness or syncope. Past Medical History:  Diagnosis Date  . Anxiety   . Arthritis   . CERVICAL RADICULOPATHY, LEFT   . CKD (chronic kidney disease)   . COMMON MIGRAINE   . CORONARY ARTERY DISEASE   . Cystitis   . DEPRESSION   . Diabetes mellitus, type II ([place])   . DIVERTICULOSIS-COLON   . Gastric ulcer 04/2008  . Gastroparesis   . GERD (gastroesophageal reflux disease)   . Hiatal hernia   . Hyperlipidemia   . Hypertension   . Hypothyroidism   . INSOMNIA-SLEEP DISORDER-UNSPEC   . Iron deficiency anemia   . Wears glasses    Past Surgical History:  Procedure Laterality Date  . ABDOMINAL HYSTERECTOMY    . BACK SURGERY  03/2016  . CHOLECYSTECTOMY  06/2009  . COLONOSCOPY    . ESOPHAGOGASTRODUODENOSCOPY    . EYE SURGERY Bilateral    lasik  . Gastric Wedge resection lipoma  11/2007   x2 with laparotomy and gastrostomy  . Left knee replacement    . Rigth knee replacement with revision  04/2008   Dr. Berenice Primas  . ROTATOR CUFF REPAIR Left 01/2009  . s/p bladder surgury  09/2009   Dr. Terance Hart    reports that she has quit smoking. She has never used smokeless tobacco. She reports that she does not drink alcohol or use drugs. family history includes Coronary artery disease in her other; Diabetes in her brother and sister; Heart disease in her brother and sister; Hypertension in her other. Allergies  Allergen Reactions  . Hydrocodone Other (See Comments)    tachycardia  .  Ciprofloxacin Other (See Comments)    dizzy  . Penicillins Hives    Has patient had a PCN reaction causing immediate rash, facial/tongue/throat swelling, SOB or lightheadedness with hypotension: Yes Has patient had a PCN reaction causing severe rash involving mucus membranes or skin necrosis: No Has patient had a PCN reaction that required hospitalization No Has patient had a PCN reaction occurring within the last 10 years: Yes If all of the above answers are "NO", then may proceed with Cephalosporin use.   \ Current Outpatient Prescriptions on File Prior to Visit  Medication Sig Dispense Refill  . amitriptyline (ELAVIL) 10 MG tablet Take 10 mg by mouth at bedtime.     Marland Kitchen amLODipine-valsartan (EXFORGE) 10-320 MG tablet Take 1 tablet by mouth daily. 90 tablet 1  . aspirin 81 MG EC tablet Take 81 mg by mouth every evening.     . cloNIDine (CATAPRES) 0.1 MG tablet TAKE 1 TABLET (0.1 MG TOTAL) BY MOUTH 2 (TWO) TIMES DAILY. 60 tablet 0  . ELMIRON 100 MG capsule Take 100 mg by mouth 2 (two) times daily.     Marland Kitchen estradiol (ESTRACE) 2 MG tablet TAKE 1 TABLET (2 MG TOTAL) BY MOUTH DAILY. 30 tablet 0  . Estradiol Acetate 0.1 MG/24HR RING Place 1 each vaginally every 3 (three) months. 1 each 4  . ferrous  sulfate 325 (65 FE) MG tablet Take 1 tablet by mouth 2 (two) times daily.    Marland Kitchen FLUoxetine (PROZAC) 20 MG capsule TAKE ONE CAPSULE BY MOUTH DAILY 90 capsule 3  . gabapentin (NEURONTIN) 600 MG tablet Take 600-1,200 mg by mouth 2 (two) times daily.   1  . glimepiride (AMARYL) 1 MG tablet TAKE 1 TABLET BY MOUTH WITH SUPPER ONCE A DAY PER DR. Dwyane Dee (Patient taking differently: TAKE 1 MG BY MOUTH WITH SUPPER ONCE A DAY PER DR. Dwyane Dee) 30 tablet 2  . glucose blood (ACCU-CHEK AVIVA PLUS) test strip Use to test blood sugar 3 times daily- Dx code E11.9 100 each 12  . HYDROcodone-acetaminophen (NORCO/VICODIN) 5-325 MG tablet Take 1 tablet by mouth every 6 (six) hours as needed for moderate pain or severe pain.     Marland Kitchen  insulin aspart (NOVOLOG FLEXPEN) 100 UNIT/ML FlexPen Inject 10 Units into the skin 3 (three) times daily with meals.    . insulin glargine (LANTUS) 100 UNIT/ML injection Inject 25 Units into the skin at bedtime.    . isosorbide dinitrate (ISORDIL) 10 MG tablet TAKE ONE TABLET BY MOUTH DAILY 90 tablet 1  . lansoprazole (PREVACID) 15 MG capsule Take 15 mg by mouth every evening.     Marland Kitchen levothyroxine (SYNTHROID, LEVOTHROID) 88 MCG tablet TAKE 1 TABLET (88 MCG TOTAL) BY MOUTH DAILY. 30 tablet 0  . linaclotide (LINZESS) 72 MCG capsule Take 1 capsule (72 mcg total) by mouth daily before breakfast. (Patient taking differently: Take 72 mcg by mouth daily as needed (constipation). ) 30 capsule 11  . meloxicam (MOBIC) 15 MG tablet Take 15 mg by mouth every evening.     . metFORMIN (GLUCOPHAGE-XR) 500 MG 24 hr tablet TAKE 1 TABLET (500 MG TOTAL) BY MOUTH 2 (TWO) TIMES DAILY. 60 tablet 2  . ondansetron (ZOFRAN) 4 MG tablet TAKE 1 TABLET BY MOUTH EVERY 6 HOURS AS NEEDED FOR NAUSEA 30 tablet 0  . simvastatin (ZOCOR) 20 MG tablet TAKE ONE TABLET BY MOUTH DAILY. OVERDUE FOR YEARLY PHYSICAL WITH LABS MUST SEE M.D. FOR REFILLS (Patient taking differently: Take 20 mg by mouth each evening) 30 tablet 5  . trimethoprim (TRIMPEX) 100 MG tablet Take 100 mg by mouth every evening.   1  . venlafaxine XR (EFFEXOR-XR) 37.5 MG 24 hr capsule Take 2 capsules (75 mg total) by mouth daily with breakfast. 60 capsule 1  . VICTOZA 18 MG/3ML SOPN Inject 0.2 mLs (1.2 mg total) into the skin daily. Inject once daily at the same time 2 pen 3   No current facility-administered medications on file prior to visit.    Review of Systems  Constitutional: Negative for other unusual diaphoresis or sweats HENT: Negative for ear discharge or swelling Eyes: Negative for other worsening visual disturbances Respiratory: Negative for stridor or other swelling  Gastrointestinal: Negative for worsening distension or other blood Genitourinary:  Negative for retention or other urinary change Musculoskeletal: Negative for other MSK pain or swelling Skin: Negative for color change or other new lesions Neurological: Negative for worsening tremors and other numbness  Psychiatric/Behavioral: Negative for worsening agitation or other fatigue All other system neg per pt    Objective:   Physical Exam BP 124/86   Pulse 70   Ht 5\' 6"  (1.676 m)   Wt 267 lb (121.1 kg)   SpO2 99%   BMI 43.09 kg/m  VS noted,  Constitutional: Pt appears in NAD HENT: Head: NCAT.  Right Ear: External ear normal.  Left  Ear: External ear normal.  Eyes: . Pupils are equal, round, and reactive to light. Conjunctivae and EOM are normal Nose: without d/c or deformity Neck: Neck supple. Gross normal ROM Cardiovascular: Normal rate and regular rhythm.   Pulmonary/Chest: Effort normal and breath sounds without rales or wheezing.  Abd:  Soft, ND, + BS, no organomegaly with low mid abd tender, no guarding or rebound Neurological: Pt is alert. At baseline orientation, motor grossly intact Skin: Skin is warm. No rashes, other new lesions, no LE edema Psychiatric: Pt behavior is normal without agitation  No other exam findings  POCT Urinalysis Dipstick  Order: [id number]  Status:  Final result Visible to patient:  No (Not Released) Dx:  Dysuria    Ref Range & Units 18:48 36mo ago 5yr ago   Color, UA       Clarity, UA       Glucose, UA  neg  NEGATIVE  NEGATIVE    Bilirubin, UA  neg      Ketones, UA  neg      Spec Grav, UA 1.010 - 1.025 >=1.030       Blood, UA  neg      pH, UA 5.0 - 8.0 6.0      Protein, UA  neg      Urobilinogen, UA 0.2 or 1.0 E.U./dL 0.2      Nitrite, UA  neg      Leukocytes, UA Negative Small (1+)   NEGATIVER  NEGATIVER, CM   Resulting Agency                Assessment & Plan:

## 2017-04-25 NOTE — Assessment & Plan Note (Signed)
stable overall by history and exam, recent data reviewed with pt, and pt to continue medical treatment as before,  to f/u any worsening symptoms or concerns Lab Results  Component Value Date   HGBA1C 6.9 (H) 11/11/2016

## 2017-04-25 NOTE — Assessment & Plan Note (Signed)
stable overall by history and exam, recent data reviewed with pt, and pt to continue medical treatment as before,  to f/u any worsening symptoms or concerns BP Readings from Last 3 Encounters:  04/23/17 124/86  02/07/17 128/80  01/26/17 (!) 113/57

## 2017-04-25 NOTE — Assessment & Plan Note (Signed)
Mild to mod, likely UTI, for urine studies, for antibx course,  to f/u any worsening symptoms or concerns

## 2017-04-27 ENCOUNTER — Other Ambulatory Visit: Payer: Self-pay | Admitting: Endocrinology

## 2017-04-28 DIAGNOSIS — N3010 Interstitial cystitis (chronic) without hematuria: Secondary | ICD-10-CM | POA: Diagnosis not present

## 2017-04-28 DIAGNOSIS — R3982 Chronic bladder pain: Secondary | ICD-10-CM | POA: Diagnosis not present

## 2017-05-01 ENCOUNTER — Encounter: Payer: Self-pay | Admitting: Family Medicine

## 2017-05-01 ENCOUNTER — Inpatient Hospital Stay (HOSPITAL_COMMUNITY)
Admission: EM | Admit: 2017-05-01 | Discharge: 2017-05-03 | DRG: 690 | Disposition: A | Discharge status: Home Health | Payer: Medicare Other | Attending: Internal Medicine | Admitting: Internal Medicine

## 2017-05-01 ENCOUNTER — Emergency Department (HOSPITAL_COMMUNITY): Payer: Medicare Other

## 2017-05-01 ENCOUNTER — Telehealth: Payer: Self-pay | Admitting: Internal Medicine

## 2017-05-01 ENCOUNTER — Ambulatory Visit (INDEPENDENT_AMBULATORY_CARE_PROVIDER_SITE_OTHER): Payer: Medicare Other | Admitting: Family Medicine

## 2017-05-01 ENCOUNTER — Encounter (HOSPITAL_COMMUNITY): Payer: Self-pay | Admitting: Emergency Medicine

## 2017-05-01 VITALS — BP 100/50 | HR 80 | Temp 98.0°F | Ht 66.0 in

## 2017-05-01 DIAGNOSIS — J449 Chronic obstructive pulmonary disease, unspecified: Secondary | ICD-10-CM | POA: Diagnosis present

## 2017-05-01 DIAGNOSIS — Z794 Long term (current) use of insulin: Secondary | ICD-10-CM

## 2017-05-01 DIAGNOSIS — E1143 Type 2 diabetes mellitus with diabetic autonomic (poly)neuropathy: Secondary | ICD-10-CM | POA: Diagnosis present

## 2017-05-01 DIAGNOSIS — E119 Type 2 diabetes mellitus without complications: Secondary | ICD-10-CM

## 2017-05-01 DIAGNOSIS — I152 Hypertension secondary to endocrine disorders: Secondary | ICD-10-CM | POA: Diagnosis present

## 2017-05-01 DIAGNOSIS — Z8711 Personal history of peptic ulcer disease: Secondary | ICD-10-CM

## 2017-05-01 DIAGNOSIS — K3184 Gastroparesis: Secondary | ICD-10-CM | POA: Diagnosis present

## 2017-05-01 DIAGNOSIS — K219 Gastro-esophageal reflux disease without esophagitis: Secondary | ICD-10-CM | POA: Diagnosis present

## 2017-05-01 DIAGNOSIS — E860 Dehydration: Secondary | ICD-10-CM | POA: Diagnosis present

## 2017-05-01 DIAGNOSIS — Z96652 Presence of left artificial knee joint: Secondary | ICD-10-CM | POA: Diagnosis present

## 2017-05-01 DIAGNOSIS — E039 Hypothyroidism, unspecified: Secondary | ICD-10-CM | POA: Diagnosis present

## 2017-05-01 DIAGNOSIS — R5381 Other malaise: Secondary | ICD-10-CM | POA: Diagnosis present

## 2017-05-01 DIAGNOSIS — E1122 Type 2 diabetes mellitus with diabetic chronic kidney disease: Secondary | ICD-10-CM | POA: Diagnosis present

## 2017-05-01 DIAGNOSIS — Z23 Encounter for immunization: Secondary | ICD-10-CM

## 2017-05-01 DIAGNOSIS — Z87891 Personal history of nicotine dependence: Secondary | ICD-10-CM

## 2017-05-01 DIAGNOSIS — E785 Hyperlipidemia, unspecified: Secondary | ICD-10-CM | POA: Diagnosis present

## 2017-05-01 DIAGNOSIS — Z880 Allergy status to penicillin: Secondary | ICD-10-CM

## 2017-05-01 DIAGNOSIS — D638 Anemia in other chronic diseases classified elsewhere: Secondary | ICD-10-CM | POA: Diagnosis present

## 2017-05-01 DIAGNOSIS — I959 Hypotension, unspecified: Secondary | ICD-10-CM | POA: Diagnosis present

## 2017-05-01 DIAGNOSIS — E872 Acidosis: Secondary | ICD-10-CM | POA: Diagnosis present

## 2017-05-01 DIAGNOSIS — E0822 Diabetes mellitus due to underlying condition with diabetic chronic kidney disease: Secondary | ICD-10-CM | POA: Diagnosis not present

## 2017-05-01 DIAGNOSIS — G8929 Other chronic pain: Secondary | ICD-10-CM | POA: Diagnosis present

## 2017-05-01 DIAGNOSIS — N390 Urinary tract infection, site not specified: Secondary | ICD-10-CM | POA: Diagnosis present

## 2017-05-01 DIAGNOSIS — R9431 Abnormal electrocardiogram [ECG] [EKG]: Secondary | ICD-10-CM | POA: Diagnosis not present

## 2017-05-01 DIAGNOSIS — E875 Hyperkalemia: Secondary | ICD-10-CM | POA: Diagnosis not present

## 2017-05-01 DIAGNOSIS — Z833 Family history of diabetes mellitus: Secondary | ICD-10-CM

## 2017-05-01 DIAGNOSIS — E1159 Type 2 diabetes mellitus with other circulatory complications: Secondary | ICD-10-CM | POA: Diagnosis present

## 2017-05-01 DIAGNOSIS — Z8249 Family history of ischemic heart disease and other diseases of the circulatory system: Secondary | ICD-10-CM | POA: Diagnosis not present

## 2017-05-01 DIAGNOSIS — N189 Chronic kidney disease, unspecified: Secondary | ICD-10-CM | POA: Diagnosis present

## 2017-05-01 DIAGNOSIS — R531 Weakness: Secondary | ICD-10-CM

## 2017-05-01 DIAGNOSIS — N184 Chronic kidney disease, stage 4 (severe): Secondary | ICD-10-CM | POA: Diagnosis present

## 2017-05-01 DIAGNOSIS — N179 Acute kidney failure, unspecified: Secondary | ICD-10-CM | POA: Diagnosis present

## 2017-05-01 DIAGNOSIS — R5383 Other fatigue: Secondary | ICD-10-CM | POA: Diagnosis present

## 2017-05-01 DIAGNOSIS — Z885 Allergy status to narcotic agent status: Secondary | ICD-10-CM

## 2017-05-01 DIAGNOSIS — F32A Depression, unspecified: Secondary | ICD-10-CM | POA: Diagnosis present

## 2017-05-01 DIAGNOSIS — A419 Sepsis, unspecified organism: Secondary | ICD-10-CM | POA: Diagnosis not present

## 2017-05-01 DIAGNOSIS — F329 Major depressive disorder, single episode, unspecified: Secondary | ICD-10-CM | POA: Diagnosis present

## 2017-05-01 DIAGNOSIS — I2510 Atherosclerotic heart disease of native coronary artery without angina pectoris: Secondary | ICD-10-CM | POA: Diagnosis present

## 2017-05-01 DIAGNOSIS — I129 Hypertensive chronic kidney disease with stage 1 through stage 4 chronic kidney disease, or unspecified chronic kidney disease: Secondary | ICD-10-CM | POA: Diagnosis present

## 2017-05-01 DIAGNOSIS — I10 Essential (primary) hypertension: Secondary | ICD-10-CM | POA: Diagnosis not present

## 2017-05-01 DIAGNOSIS — N19 Unspecified kidney failure: Secondary | ICD-10-CM | POA: Diagnosis not present

## 2017-05-01 DIAGNOSIS — D649 Anemia, unspecified: Secondary | ICD-10-CM | POA: Diagnosis not present

## 2017-05-01 DIAGNOSIS — Z881 Allergy status to other antibiotic agents status: Secondary | ICD-10-CM

## 2017-05-01 DIAGNOSIS — D539 Nutritional anemia, unspecified: Secondary | ICD-10-CM | POA: Diagnosis present

## 2017-05-01 LAB — CBC WITH DIFFERENTIAL/PLATELET
Basophils Absolute: 0 10*3/uL (ref 0.0–0.1)
Basophils Relative: 0 %
Eosinophils Absolute: 0.3 10*3/uL (ref 0.0–0.7)
Eosinophils Relative: 3 %
HCT: 28.8 % — ABNORMAL LOW (ref 36.0–46.0)
Hemoglobin: 9 g/dL — ABNORMAL LOW (ref 12.0–15.0)
Lymphocytes Relative: 25 %
Lymphs Abs: 2 10*3/uL (ref 0.7–4.0)
MCH: 29.7 pg (ref 26.0–34.0)
MCHC: 31.3 g/dL (ref 30.0–36.0)
MCV: 95 fL (ref 78.0–100.0)
Monocytes Absolute: 0.3 10*3/uL (ref 0.1–1.0)
Monocytes Relative: 4 %
Neutro Abs: 5.3 10*3/uL (ref 1.7–7.7)
Neutrophils Relative %: 68 %
Platelets: 228 10*3/uL (ref 150–400)
RBC: 3.03 MIL/uL — ABNORMAL LOW (ref 3.87–5.11)
RDW: 13.2 % (ref 11.5–15.5)
WBC: 7.9 10*3/uL (ref 4.0–10.5)

## 2017-05-01 LAB — COMPREHENSIVE METABOLIC PANEL
ALT: 9 U/L — ABNORMAL LOW (ref 14–54)
AST: 11 U/L — ABNORMAL LOW (ref 15–41)
Albumin: 3.5 g/dL (ref 3.5–5.0)
Alkaline Phosphatase: 77 U/L (ref 38–126)
Anion gap: 11 (ref 5–15)
BUN: 56 mg/dL — ABNORMAL HIGH (ref 6–20)
CO2: 16 mmol/L — ABNORMAL LOW (ref 22–32)
Calcium: 9.3 mg/dL (ref 8.9–10.3)
Chloride: 103 mmol/L (ref 101–111)
Creatinine, Ser: 5.29 mg/dL — ABNORMAL HIGH (ref 0.44–1.00)
GFR calc Af Amer: 9 mL/min — ABNORMAL LOW (ref >60)
GFR calc non Af Amer: 8 mL/min — ABNORMAL LOW (ref >60)
Glucose, Bld: 422 mg/dL — ABNORMAL HIGH (ref 65–99)
Potassium: 5.4 mmol/L — ABNORMAL HIGH (ref 3.5–5.1)
Sodium: 130 mmol/L — ABNORMAL LOW (ref 135–145)
Total Bilirubin: 1.2 mg/dL (ref 0.3–1.2)
Total Protein: 6.6 g/dL (ref 6.5–8.1)

## 2017-05-01 LAB — URINALYSIS, ROUTINE W REFLEX MICROSCOPIC
Bilirubin Urine: NEGATIVE
Glucose, UA: 50 mg/dL — AB
Hgb urine dipstick: NEGATIVE
Ketones, ur: NEGATIVE mg/dL
Leukocytes, UA: NEGATIVE
Nitrite: POSITIVE — AB
Protein, ur: NEGATIVE mg/dL
Specific Gravity, Urine: 1.017 (ref 1.005–1.030)
pH: 5 (ref 5.0–8.0)

## 2017-05-01 LAB — PROTIME-INR
INR: 1.43
Prothrombin Time: 17.6 seconds — ABNORMAL HIGH (ref 11.4–15.2)

## 2017-05-01 LAB — I-STAT CG4 LACTIC ACID, ED
Lactic Acid, Venous: 2.54 mmol/L (ref 0.5–1.9)
Lactic Acid, Venous: 1.39 mmol/L (ref 0.5–1.9)

## 2017-05-01 MED ORDER — FLUOXETINE HCL 20 MG PO CAPS
20.0000 mg | ORAL_CAPSULE | Freq: Every day | ORAL | Status: DC
  Administered 2017-05-02 – 2017-05-03 (×2): 20 mg via ORAL
  Filled 2017-05-01 (×2): qty 1

## 2017-05-01 MED ORDER — ONDANSETRON HCL 4 MG/2ML IJ SOLN
4.0000 mg | Freq: Three times a day (TID) | INTRAMUSCULAR | Status: DC | PRN
  Filled 2017-05-01: qty 2

## 2017-05-01 MED ORDER — ISOSORBIDE DINITRATE 10 MG PO TABS
10.0000 mg | ORAL_TABLET | Freq: Every day | ORAL | Status: DC
  Administered 2017-05-02 – 2017-05-03 (×2): 10 mg via ORAL
  Filled 2017-05-01 (×2): qty 1

## 2017-05-01 MED ORDER — SODIUM CHLORIDE 0.9 % IV BOLUS (SEPSIS)
500.0000 mL | Freq: Once | INTRAVENOUS | Status: AC
  Administered 2017-05-01: 500 mL via INTRAVENOUS

## 2017-05-01 MED ORDER — ACETAMINOPHEN 325 MG PO TABS: 650.0000 mg | ORAL_TABLET | Freq: Four times a day (QID) | ORAL | Status: DC | PRN

## 2017-05-01 MED ORDER — PENTOSAN POLYSULFATE SODIUM 100 MG PO CAPS
100.0000 mg | ORAL_CAPSULE | Freq: Two times a day (BID) | ORAL | Status: DC
  Administered 2017-05-02 – 2017-05-03 (×4): 100 mg via ORAL
  Filled 2017-05-01 (×4): qty 1

## 2017-05-01 MED ORDER — DEXTROSE 5 % IV SOLN
500.0000 mg | Freq: Three times a day (TID) | INTRAVENOUS | Status: DC
  Administered 2017-05-02 (×3): 500 mg via INTRAVENOUS
  Filled 2017-05-01 (×3): qty 0.5

## 2017-05-01 MED ORDER — AMITRIPTYLINE HCL 10 MG PO TABS
10.0000 mg | ORAL_TABLET | Freq: Every day | ORAL | Status: DC
  Administered 2017-05-02 (×2): 10 mg via ORAL
  Filled 2017-05-01 (×2): qty 1

## 2017-05-01 MED ORDER — ENOXAPARIN SODIUM 40 MG/0.4ML ~~LOC~~ SOLN: 40.0000 mg | SUBCUTANEOUS | Status: DC

## 2017-05-01 MED ORDER — SODIUM CHLORIDE 0.9 % IV SOLN
Freq: Once | INTRAVENOUS | Status: AC
  Administered 2017-05-01: 21:00:00 via INTRAVENOUS

## 2017-05-01 MED ORDER — SODIUM CHLORIDE 0.9 % IV BOLUS (SEPSIS)
1500.0000 mL | Freq: Once | INTRAVENOUS | Status: AC
  Administered 2017-05-02: 1500 mL via INTRAVENOUS

## 2017-05-01 MED ORDER — ZOLPIDEM TARTRATE 5 MG PO TABS: 5.0000 mg | ORAL_TABLET | Freq: Every evening | ORAL | Status: DC | PRN

## 2017-05-01 MED ORDER — VENLAFAXINE HCL ER 75 MG PO CP24
75.0000 mg | ORAL_CAPSULE | Freq: Every day | ORAL | Status: DC
  Administered 2017-05-02 – 2017-05-03 (×2): 75 mg via ORAL
  Filled 2017-05-01 (×2): qty 1

## 2017-05-01 MED ORDER — FERROUS SULFATE 325 (65 FE) MG PO TABS
325.0000 mg | ORAL_TABLET | Freq: Two times a day (BID) | ORAL | Status: DC
Start: 2017-05-02 — End: 2017-05-03
  Administered 2017-05-02 – 2017-05-03 (×4): 325 mg via ORAL
  Filled 2017-05-01 (×4): qty 1

## 2017-05-01 MED ORDER — SIMVASTATIN 20 MG PO TABS
20.0000 mg | ORAL_TABLET | Freq: Every day | ORAL | Status: DC
  Administered 2017-05-02: 20 mg via ORAL
  Filled 2017-05-01 (×2): qty 1

## 2017-05-01 MED ORDER — PANTOPRAZOLE SODIUM 20 MG PO TBEC
20.0000 mg | DELAYED_RELEASE_TABLET | Freq: Every day | ORAL | Status: DC
  Administered 2017-05-02 – 2017-05-03 (×2): 20 mg via ORAL
  Filled 2017-05-01 (×2): qty 1

## 2017-05-01 MED ORDER — ASPIRIN EC 81 MG PO TBEC
81.0000 mg | DELAYED_RELEASE_TABLET | Freq: Every evening | ORAL | Status: DC
  Administered 2017-05-02: 81 mg via ORAL
  Filled 2017-05-01: qty 1

## 2017-05-01 MED ORDER — SODIUM CHLORIDE 0.9 % IV SOLN: Freq: Once | INTRAVENOUS | Status: DC

## 2017-05-01 MED ORDER — HYDROCODONE-ACETAMINOPHEN 5-325 MG PO TABS: 1.0000 | ORAL_TABLET | Freq: Four times a day (QID) | ORAL | Status: DC | PRN

## 2017-05-01 MED ORDER — LEVOTHYROXINE SODIUM 88 MCG PO TABS
88.0000 ug | ORAL_TABLET | Freq: Every day | ORAL | Status: DC
  Administered 2017-05-02 – 2017-05-03 (×2): 88 ug via ORAL
  Filled 2017-05-01 (×2): qty 1

## 2017-05-01 MED ORDER — ENOXAPARIN SODIUM 30 MG/0.3ML ~~LOC~~ SOLN
30.0000 mg | SUBCUTANEOUS | Status: DC
  Administered 2017-05-02 – 2017-05-03 (×2): 30 mg via SUBCUTANEOUS
  Filled 2017-05-01 (×2): qty 0.3

## 2017-05-01 MED ORDER — HYDRALAZINE HCL 20 MG/ML IJ SOLN: 5.0000 mg | INTRAMUSCULAR | Status: DC | PRN

## 2017-05-01 MED ORDER — ACETAMINOPHEN 650 MG RE SUPP: 650.0000 mg | Freq: Four times a day (QID) | RECTAL | Status: DC | PRN

## 2017-05-01 MED ORDER — ESTRADIOL ACETATE 0.1 MG/24HR VA RING: 1.0000 | VAGINAL_RING | VAGINAL | Status: DC

## 2017-05-01 MED ORDER — LEVETIRACETAM 250 MG PO TABS
250.0000 mg | ORAL_TABLET | Freq: Two times a day (BID) | ORAL | Status: DC
  Administered 2017-05-02 – 2017-05-03 (×4): 250 mg via ORAL
  Filled 2017-05-01 (×5): qty 1

## 2017-05-01 MED ORDER — LINACLOTIDE 72 MCG PO CAPS: 72.0000 ug | ORAL_CAPSULE | Freq: Every day | ORAL | Status: DC | PRN

## 2017-05-01 MED ORDER — SODIUM POLYSTYRENE SULFONATE 15 GM/60ML PO SUSP
45.0000 g | Freq: Once | ORAL | Status: AC
  Administered 2017-05-02: 45 g via ORAL
  Filled 2017-05-01: qty 180

## 2017-05-01 MED ORDER — INSULIN GLARGINE 100 UNIT/ML ~~LOC~~ SOLN
20.0000 [IU] | Freq: Every day | SUBCUTANEOUS | Status: DC
  Administered 2017-05-02 (×2): 20 [IU] via SUBCUTANEOUS
  Filled 2017-05-01 (×3): qty 0.2

## 2017-05-01 MED ORDER — INSULIN ASPART 100 UNIT/ML ~~LOC~~ SOLN
0.0000 [IU] | Freq: Three times a day (TID) | SUBCUTANEOUS | Status: DC
  Administered 2017-05-02: 3 [IU] via SUBCUTANEOUS
  Administered 2017-05-02: 5 [IU] via SUBCUTANEOUS
  Administered 2017-05-03 (×2): 1 [IU] via SUBCUTANEOUS

## 2017-05-01 MED ORDER — INSULIN ASPART 100 UNIT/ML ~~LOC~~ SOLN
12.0000 [IU] | Freq: Once | SUBCUTANEOUS | Status: AC
  Administered 2017-05-01: 12 [IU] via SUBCUTANEOUS
  Filled 2017-05-01: qty 1

## 2017-05-01 NOTE — Telephone Encounter (Signed)
ROV.

## 2017-05-01 NOTE — Progress Notes (Signed)
Pharmacy Antibiotic Note  [name] is a 66 y.o. female admitted on 05/01/2017 with Pyleonephritis.  Pharmacy has been consulted for Aztreonam dosing.  Pt on chronic Trimethoprim since 08/2014 and has been on Levofloxacin for UTI since 7/18.  She is presenting with AKI; Cr 1.67 in Feb 2018.  Plan: Aztreonam 250mg  IV q8 Trend clinical status and f/u cultures Watch renal function, adjust dosing as appropriate Adjust Lovenox to 30mg  SQ q24  Weight: 278 lb (126.1 kg)  Temp (24hrs), Avg:98.1 F (36.7 C), Min:97.9 F (36.6 C), Max:98.4 F (36.9 C)   Recent Labs Lab 05/01/17 1718 05/01/17 1734 05/01/17 2243  WBC 7.9  --   --   CREATININE 5.29*  --   --   LATICACIDVEN  --  2.54* 1.39    Estimated Creatinine Clearance: 14 mL/min (A) (by C-G formula based on SCr of 5.29 mg/dL (H)).    Allergies  Allergen Reactions  . Hydrocodone Other (See Comments)    Tachycardia   . Ciprofloxacin Other (See Comments)    Dizziness   . Penicillins Hives    Has patient had a PCN reaction causing immediate rash, facial/tongue/throat swelling, SOB or lightheadedness with hypotension: Yes Has patient had a PCN reaction causing severe rash involving mucus membranes or skin necrosis: No Has patient had a PCN reaction that required hospitalization No Has patient had a PCN reaction occurring within the last 10 years: Yes If all of the above answers are "NO", then may proceed with Cephalosporin use.     Antimicrobials this admission: Aztreonam 7/26 >>  Dose adjustments this admission: n/a  Microbiology results: 7/26 BCx:  7/26 UCx:    Thank you for allowing pharmacy to be a part of this patient's care.   Lewie Chamber., PharmD Clinical Pharmacist [facility]

## 2017-05-01 NOTE — Telephone Encounter (Signed)
Pt called in and said that she was seen back on the 18th for  uti but she is not any better.  What should she do?    Best number [phone]

## 2017-05-01 NOTE — Patient Instructions (Signed)
Thank you for coming in,   Please go to the emergency room to be evaluated. I'm concerned that he may have an infection of her kidneys.   Please feel free to call with any questions or concerns at any time, at [phone]. --Dr. Raeford Razor

## 2017-05-01 NOTE — ED Notes (Signed)
Patient transported to CT 

## 2017-05-01 NOTE — Assessment & Plan Note (Signed)
Unclear if she has an underlying pyelonephritis or she is not has regained her strength from her UTI. She is hypotensive today compared to her normal hypertensive self. She has not had a void since last night and there is concern that she may have an AKI with underlying chronic kidney disease. - Advised that she have her husband take her to the emergency department to be evaluated. She may need a CT scan performed in addition to blood work. - The patient and her husband agree to this plan and he will take her with his vehicle.

## 2017-05-01 NOTE — Progress Notes (Signed)
[name] - 66 y.o. female MRN [id number]  Date of birth: 1951/08/15  SUBJECTIVE:  Including CC & ROS.  Chief Complaint  Patient presents with  . Extremity Weakness    weakness not getting better it is acutually getting worse hard to get up and walk. patient states she is always cold and doesnt have an apetite   [name] is a 66 year old female is presenting with generalized fatigue and weakness. She reports that this is been ongoing since she was seen on 7/18. She was treated with Levaquin for a urinary tract infection. She had some improvement with that for her urinary tract infection symptoms but still has his overall weakness. She usually has elevated blood pressure but it is lower today. She has not had a void since last night. She was seen by her urologist who did a procedure for her interstitial cystitis. She reports having some chills. She was febrile on Sunday. She has some pain in her back. She had some vomiting on Sunday.     Review of Systems  Constitutional: Positive for activity change, appetite change, chills and fever.  Respiratory: Negative for shortness of breath.   Cardiovascular: Negative for chest pain.  Gastrointestinal: Positive for vomiting. Negative for nausea.  Endocrine: Negative for polyuria.  Genitourinary: Positive for flank pain. Negative for dysuria.  Musculoskeletal: Positive for back pain.  Skin: Negative for rash.    HISTORY: Past Medical, Surgical, Social, and Family History Reviewed & Updated per EMR.   Pertinent Historical Findings include:  Past Medical History:  Diagnosis Date  . Anxiety   . Arthritis   . CERVICAL RADICULOPATHY, LEFT   . CKD (chronic kidney disease)   . COMMON MIGRAINE   . CORONARY ARTERY DISEASE   . Cystitis   . DEPRESSION   . Diabetes mellitus, type II ([place])   . DIVERTICULOSIS-COLON   . Gastric ulcer 04/2008  . Gastroparesis   . GERD (gastroesophageal reflux disease)   . Hiatal hernia   . Hyperlipidemia   .  Hypertension   . Hypothyroidism   . INSOMNIA-SLEEP DISORDER-UNSPEC   . Iron deficiency anemia   . Wears glasses     Past Surgical History:  Procedure Laterality Date  . ABDOMINAL HYSTERECTOMY    . BACK SURGERY  03/2016  . CHOLECYSTECTOMY  06/2009  . COLONOSCOPY    . ESOPHAGOGASTRODUODENOSCOPY    . EYE SURGERY Bilateral    lasik  . Gastric Wedge resection lipoma  11/2007   x2 with laparotomy and gastrostomy  . Left knee replacement    . Rigth knee replacement with revision  04/2008   Dr. Berenice Primas  . ROTATOR CUFF REPAIR Left 01/2009  . s/p bladder surgury  09/2009   Dr. Terance Hart    Allergies  Allergen Reactions  . Hydrocodone Other (See Comments)    tachycardia  . Ciprofloxacin Other (See Comments)    dizzy  . Penicillins Hives    Has patient had a PCN reaction causing immediate rash, facial/tongue/throat swelling, SOB or lightheadedness with hypotension: Yes Has patient had a PCN reaction causing severe rash involving mucus membranes or skin necrosis: No Has patient had a PCN reaction that required hospitalization No Has patient had a PCN reaction occurring within the last 10 years: Yes If all of the above answers are "NO", then may proceed with Cephalosporin use.     Family History  Problem Relation Age of Onset  . Diabetes Sister        x 3  .  Heart disease Sister        x2  . Diabetes Brother        x3  . Heart disease Brother        x2  . Coronary artery disease Other        female 1st degree  . Hypertension Other   . Colon cancer Neg Hx      Social History   Social History  . Marital status: Married    Spouse name: N/A  . Number of children: N/A  . Years of education: N/A   Occupational History  . disabled    Social History Main Topics  . Smoking status: Former Research scientist (life sciences)  . Smokeless tobacco: Never Used     Comment: quit 30 years ago  . Alcohol use No  . Drug use: No  . Sexual activity: Not Currently    Birth control/ protection: None   Other  Topics Concern  . Not on file   Social History Narrative   Patient gets regular exercise     PHYSICAL EXAM:  VS: BP (!) 100/50 (BP Location: Right Arm, Patient Position: Sitting, Cuff Size: Large)   Pulse 80   Temp 98 F (36.7 C) (Oral)   Ht 5\' 6"  (1.676 m)   SpO2 99%  Physical Exam Gen: NAD, alert, cooperative with exam,  ENT: normal lips, normal nasal mucosa,  Eye: PERRL, normal conjunctiva and lids CV:  Regular rate and rhythm, S1-S2, no edema, +2 pedal pulses   Resp: Clear to auscultation bilaterally, no wheezes or crackles, no accessory muscle use, non-labored,  GI: Soft, nontender, positive bowel sounds, no masses or tenderness, no hernia  Skin: no rashes, no areas of induration  Neuro:  normal sensation to touch Psych:  normal insight, alert and oriented MSK: Some CVA tenderness of the right flank, was pushed in a wheelchair.   ASSESSMENT & PLAN:   Weakness Unclear if she has an underlying pyelonephritis or she is not has regained her strength from her UTI. She is hypotensive today compared to her normal hypertensive self. She has not had a void since last night and there is concern that she may have an AKI with underlying chronic kidney disease. - Advised that she have her husband take her to the emergency department to be evaluated. She may need a CT scan performed in addition to blood work. - The patient and her husband agree to this plan and he will take her with his vehicle.

## 2017-05-01 NOTE — ED Notes (Signed)
Attempted x2 for IV access  

## 2017-05-01 NOTE — ED Notes (Signed)
IV Team at bedside 

## 2017-05-01 NOTE — H&P (Signed)
History and Physical    [name] YIF:[id number] DOB: 1951/02/20 DOA: 05/01/2017  Referring MD/NP/PA:   PCP: Biagio Borg, MD   Patient coming from:  The patient is coming from home.  At baseline, pt is independent for most of ADL.   Chief Complaint: Generalized weakness, dysuria and burning on urination  HPI: [name] is a 66 y.o. female with medical history significant of interstitial cystitis, hypertension, hyperlipidemia, diabetes mellitus, GERD, hypothyroidism, depression, iron deficiency anemia, gastric ulcer, gastroparesis, COPD, CKD-4, who presents with generalized weakness, dysuria and burning on urination.  Patient states that she was recently treated by her doctor with Levaquin for UTI. She completed 7 day course of treatment yesterday. She states that she her symptoms have improved, but still has dysuria and burning on urination.  She also followed up with urology due to interstitial cystitis. She states that she had a bladder "instillation" done on Tuesday by her urologist, "putting numb medication to her bladder".  Patient states that she has a generalized weakness, but no unilateral weakness, numbness or tingling to extremities. She has decreased oral intake and poor appetite. She also has decreased urine output. Patient had nausea and vomiting on Sunday, which has resolved. She also had mild lower abdominal pain earlier, which has resolved. Currently no nausea, vomiting, abdominal pain, diarrhea. She has chills, but no fever.  ED Course: pt was found to have WBC 7.9, lactate 2.54, 1.39, potassium of 5.4, worsening renal function with creatinine increased from baseline 1.3-1.6 to 5.29, BUN 56, temperature normal, no tachycardia, oxygen saturation and 80% on room air, chest x-ray is negative for acute abnormalities. CT-renal stone protocol showed no hydronephrosis, but showed possible right sided tiny stone in distal right ureter. Patient initially was hypotensive with  blood pressure 87/59, which improved to 124/80 after 1 L normal saline bolus in ED. Patient is admitted to telemetry bed as inpatient.  Review of Systems:   General: no fevers, has chills, no changes in body weight, has poor appetite, has fatigue HEENT: no blurry vision, hearing changes or sore throat Respiratory: no dyspnea, coughing, wheezing CV: no chest pain, no palpitations GI: had nausea, vomiting, abdominal pain, no diarrhea, constipation GU: has dysuria, burning on urination, no increased urinary frequency, hematuria  Ext: no leg edema Neuro: no unilateral weakness, numbness, or tingling, no vision change or hearing loss Skin: no rash, no skin tear. MSK: No muscle spasm, no deformity, no limitation of range of movement in spin Heme: No easy bruising.  Travel history: No recent long distant travel.  Allergy:  Allergies  Allergen Reactions  . Hydrocodone Other (See Comments)    Tachycardia   . Ciprofloxacin Other (See Comments)    Dizziness   . Penicillins Hives    Has patient had a PCN reaction causing immediate rash, facial/tongue/throat swelling, SOB or lightheadedness with hypotension: Yes Has patient had a PCN reaction causing severe rash involving mucus membranes or skin necrosis: No Has patient had a PCN reaction that required hospitalization No Has patient had a PCN reaction occurring within the last 10 years: Yes If all of the above answers are "NO", then may proceed with Cephalosporin use.     Past Medical History:  Diagnosis Date  . Anxiety   . Arthritis   . CERVICAL RADICULOPATHY, LEFT   . CKD (chronic kidney disease)   . COMMON MIGRAINE   . CORONARY ARTERY DISEASE   . Cystitis   . DEPRESSION   . Diabetes mellitus, type  II ([place])   . DIVERTICULOSIS-COLON   . Gastric ulcer 04/2008  . Gastroparesis   . GERD (gastroesophageal reflux disease)   . Hiatal hernia   . Hyperlipidemia   . Hypertension   . Hypothyroidism   . INSOMNIA-SLEEP DISORDER-UNSPEC     . Iron deficiency anemia   . Wears glasses     Past Surgical History:  Procedure Laterality Date  . ABDOMINAL HYSTERECTOMY    . BACK SURGERY  03/2016  . CHOLECYSTECTOMY  06/2009  . COLONOSCOPY    . ESOPHAGOGASTRODUODENOSCOPY    . EYE SURGERY Bilateral    lasik  . Gastric Wedge resection lipoma  11/2007   x2 with laparotomy and gastrostomy  . Left knee replacement    . Rigth knee replacement with revision  04/2008   Dr. Berenice Primas  . ROTATOR CUFF REPAIR Left 01/2009  . s/p bladder surgury  09/2009   Dr. Terance Hart    Social History:  reports that she has quit smoking. She has never used smokeless tobacco. She reports that she does not drink alcohol or use drugs.  Family History:  Family History  Problem Relation Age of Onset  . Diabetes Sister        x 3  . Heart disease Sister        x2  . Diabetes Brother        x3  . Heart disease Brother        x2  . Coronary artery disease Other        female 1st degree  . Hypertension Other   . Colon cancer Neg Hx      Prior to Admission medications   Medication Sig Start Date End Date Taking? Authorizing Provider  amitriptyline (ELAVIL) 10 MG tablet Take 10 mg by mouth at bedtime.  07/26/15  Yes [provider]  amLODipine-valsartan (EXFORGE) 10-320 MG tablet Take 1 tablet by mouth daily. Patient taking differently: Take 1 tablet by mouth at bedtime.  02/27/16  Yes Biagio Borg, MD  aspirin 81 MG EC tablet Take 81 mg by mouth every evening.    Yes [provider]  cloNIDine (CATAPRES) 0.1 MG tablet TAKE 1 TABLET (0.1 MG TOTAL) BY MOUTH 2 (TWO) TIMES DAILY. 04/28/17  Yes Elayne Snare, MD  ELMIRON 100 MG capsule Take 100 mg by mouth 2 (two) times daily.  07/20/13  Yes [provider]  Estradiol Acetate 0.1 MG/24HR RING Place 1 each vaginally every 3 (three) months. 02/07/17  Yes Terrance Mass, MD  ferrous sulfate 325 (65 FE) MG tablet Take 325 mg by mouth 2 (two) times daily.  01/21/17  Yes [provider]  FLUoxetine (PROZAC) 20 MG capsule TAKE ONE CAPSULE BY MOUTH DAILY Patient taking differently: Take 20 mg by mouth at bedtime 03/06/17  Yes Biagio Borg, MD  glimepiride (AMARYL) 1 MG tablet TAKE 1 TABLET BY MOUTH WITH SUPPER ONCE A DAY PER DR. Dwyane Dee Patient taking differently: Take 1 mg by mouth with supper if BGL is 250 or greater 09/06/15  Yes Elayne Snare, MD  HYDROcodone-acetaminophen (NORCO/VICODIN) 5-325 MG tablet Take 1 tablet by mouth every 6 (six) hours as needed for moderate pain or severe pain.  12/23/16  Yes [provider]  insulin aspart (NOVOLOG FLEXPEN) 100 UNIT/ML FlexPen Inject 10 Units into the skin 3 (three) times daily with meals.   Yes [provider]  insulin glargine (LANTUS) 100 UNIT/ML injection Inject 25 Units into the skin at bedtime.  Yes [provider]  isosorbide dinitrate (ISORDIL) 10 MG tablet TAKE ONE TABLET BY MOUTH DAILY Patient taking differently: Take 10 mg by mouth once a day 08/01/14  Yes Biagio Borg, MD  lansoprazole (PREVACID) 15 MG capsule Take 15 mg by mouth every evening.    Yes [provider]  levETIRAcetam (KEPPRA) 250 MG tablet Take 250 mg by mouth 2 (two) times daily. 04/24/17  Yes [provider]  levothyroxine (SYNTHROID, LEVOTHROID) 88 MCG tablet TAKE ONE TABLET BY MOUTH DAILY Patient taking differently: Take 88 mcg by mouth in the morning 04/28/17  Yes Elayne Snare, MD  linaclotide [facility]) 72 MCG capsule Take 1 capsule (72 mcg total) by mouth daily before breakfast. Patient taking differently: Take 72 mcg by mouth daily as needed (for constipation).  11/21/16  Yes Ladene Artist, MD  meloxicam (MOBIC) 15 MG tablet Take 15 mg by mouth every evening.  12/23/16  Yes [provider]  ondansetron (ZOFRAN) 4 MG tablet TAKE 1 TABLET BY MOUTH EVERY 6 HOURS AS NEEDED FOR NAUSEA Patient taking differently: Take 4 mg by mouth every six hours as needed for nausea 10/11/16  Yes Biagio Borg, MD    simvastatin (ZOCOR) 20 MG tablet TAKE ONE TABLET BY MOUTH DAILY. OVERDUE FOR YEARLY PHYSICAL WITH LABS MUST SEE M.D. FOR REFILLS Patient taking differently: Take 20 mg by mouth in the evening 01/06/17  Yes Biagio Borg, MD  trimethoprim (TRIMPEX) 100 MG tablet Take 100 mg by mouth every evening.  08/08/14  Yes [provider]  venlafaxine XR (EFFEXOR-XR) 37.5 MG 24 hr capsule Take 2 capsules (75 mg total) by mouth daily with breakfast. 12/03/16  Yes Lyndal Pulley, DO  VICTOZA 18 MG/3ML SOPN Inject 0.2 mLs (1.2 mg total) into the skin daily. Inject once daily at the same time Patient taking differently: Inject 1.2 mg into the skin at bedtime.  08/15/16  Yes Elayne Snare, MD  estradiol (ESTRACE) 2 MG tablet TAKE 1 TABLET (2 MG TOTAL) BY MOUTH DAILY. Patient not taking: Reported on 05/01/2017 08/05/16   Terrance Mass, MD  glucose blood (ACCU-CHEK AVIVA PLUS) test strip Use to test blood sugar 3 times daily- Dx code E11.9 11/13/16   Elayne Snare, MD  levofloxacin (LEVAQUIN) 500 MG tablet Take 1 tablet (500 mg total) by mouth daily. Patient not taking: Reported on 05/01/2017 04/23/17 05/03/17  Biagio Borg, MD  metFORMIN (GLUCOPHAGE-XR) 500 MG 24 hr tablet TAKE 1 TABLET (500 MG TOTAL) BY MOUTH 2 (TWO) TIMES DAILY. Patient not taking: Reported on 05/01/2017 04/30/16   Elayne Snare, MD  phenazopyridine (PYRIDIUM) 100 MG tablet Take 1 tablet (100 mg total) by mouth 3 (three) times daily as needed for pain. Patient not taking: Reported on 05/01/2017 04/23/17   Biagio Borg, MD    Physical Exam: Vitals:   05/01/17 2230 05/01/17 2245 05/01/17 2300 05/01/17 2315  BP: 118/89 123/63 134/64 130/63  Pulse: 72 75 73 73  Resp: 13 15 11 16   Temp:      TempSrc:      SpO2: 100% 100% 100% 100%  Weight:       General: Not in acute distress. Has dry mucus and membrane. HEENT:       Eyes: PERRL, EOMI, no scleral icterus.       ENT: No discharge from the ears and nose, no pharynx injection, no tonsillar  enlargement.        Neck: No JVD, no bruit, no mass  felt. Heme: No neck lymph node enlargement. Cardiac: S1/S2, RRR, No murmurs, No gallops or rubs. Respiratory: No rales, wheezing, rhonchi or rubs. GI: Soft, nondistended, nontender, no rebound pain, no organomegaly, BS present. GU: No hematuria Ext: No pitting leg edema bilaterally. 2+DP/PT pulse bilaterally. Musculoskeletal: No joint deformities, No joint redness or warmth, no limitation of ROM in spin. Skin: No rashes.  Neuro: Alert, oriented X3, cranial nerves II-XII grossly intact, moves all extremities normally.  Psych: Patient is not psychotic, no suicidal or hemocidal ideation.  Labs on Admission: I have personally reviewed following labs and imaging studies  CBC:  Recent Labs Lab 05/01/17 1718  WBC 7.9  NEUTROABS 5.3  HGB 9.0*  HCT 28.8*  MCV 95.0  PLT 371   Basic Metabolic Panel:  Recent Labs Lab 05/01/17 1718  NA 130*  K 5.4*  CL 103  CO2 16*  GLUCOSE 422*  BUN 56*  CREATININE 5.29*  CALCIUM 9.3   GFR: Estimated Creatinine Clearance: 14 mL/min (A) (by C-G formula based on SCr of 5.29 mg/dL (H)). Liver Function Tests:  Recent Labs Lab 05/01/17 1718  AST 11*  ALT 9*  ALKPHOS 77  BILITOT 1.2  PROT 6.6  ALBUMIN 3.5   No results for input(s): LIPASE, AMYLASE in the last 168 hours. No results for input(s): AMMONIA in the last 168 hours. Coagulation Profile:  Recent Labs Lab 05/01/17 1718  INR 1.43   Cardiac Enzymes: No results for input(s): CKTOTAL, CKMB, CKMBINDEX, TROPONINI in the last 168 hours. BNP (last 3 results) No results for input(s): PROBNP in the last 8760 hours. HbA1C: No results for input(s): HGBA1C in the last 72 hours. CBG: No results for input(s): GLUCAP in the last 168 hours. Lipid Profile: No results for input(s): CHOL, HDL, LDLCALC, TRIG, CHOLHDL, LDLDIRECT in the last 72 hours. Thyroid Function Tests: No results for input(s): TSH, T4TOTAL, FREET4, T3FREE, THYROIDAB in  the last 72 hours. Anemia Panel: No results for input(s): VITAMINB12, FOLATE, FERRITIN, TIBC, IRON, RETICCTPCT in the last 72 hours. Urine analysis:    Component Value Date/Time   COLORURINE AMBER (A) 05/01/2017 2300   APPEARANCEUR CLEAR 05/01/2017 2300   LABSPEC 1.017 05/01/2017 2300   PHURINE 5.0 05/01/2017 2300   GLUCOSEU 50 (A) 05/01/2017 2300   GLUCOSEU NEGATIVE 07/25/2015 1140   HGBUR NEGATIVE 05/01/2017 2300   BILIRUBINUR NEGATIVE 05/01/2017 2300   BILIRUBINUR neg 04/23/2017 1848   KETONESUR NEGATIVE 05/01/2017 2300   PROTEINUR NEGATIVE 05/01/2017 2300   UROBILINOGEN 0.2 04/23/2017 1848   UROBILINOGEN 0.2 07/25/2015 1140   NITRITE POSITIVE (A) 05/01/2017 2300   LEUKOCYTESUR NEGATIVE 05/01/2017 2300   Sepsis Labs: @LABRCNTIP (procalcitonin:4,lacticidven:4) )No results found for this or any previous visit (from the past 240 hour(s)).   Radiological Exams on Admission: Dg Chest 2 View  Result Date: 05/01/2017 CLINICAL DATA:  Sepsis.  Feeling sick for 2 weeks. EXAM: CHEST  2 VIEW COMPARISON:  March 28, 2016 FINDINGS: The heart size and mediastinal contours are within normal limits. There is no focal infiltrate, pulmonary edema, or pleural effusion. The visualized skeletal structures are unremarkable. IMPRESSION: No active cardiopulmonary disease. Electronically Signed   By: Abelardo Diesel M.D.   On: 05/01/2017 18:12   Ct Renal Stone Study  Result Date: 05/01/2017 CLINICAL DATA:  Right lower quadrant pain and renal failure, nausea vomiting and diarrhea EXAM: CT ABDOMEN AND PELVIS WITHOUT CONTRAST TECHNIQUE: Multidetector CT imaging of the abdomen and pelvis was performed following the standard protocol without IV contrast. COMPARISON:  02/26/2017,  01/26/2017 FINDINGS: Lower chest: Lung bases demonstrate no acute consolidation or pleural effusion. Heart size nonenlarged. Hepatobiliary: No focal liver abnormality is seen. Status post cholecystectomy. No biliary dilatation. Pancreas:  Unremarkable. No pancreatic ductal dilatation or surrounding inflammatory changes. Spleen: Normal in size without focal abnormality. Adrenals/Urinary Tract: Adrenal glands are within normal limits. No hydronephrosis or hydroureter. Punctate density in the right low pelvis is questionable for a tiny stone in the ureter. Bladder otherwise normal Stomach/Bowel: Postsurgical changes of the stomach. Appendix appears normal. No evidence of bowel wall thickening, distention, or inflammatory changes. Vascular/Lymphatic: Aortic atherosclerosis. No enlarged abdominal or pelvic lymph nodes. Reproductive: Circular ring like structure in the vagina. No adnexal masses. Status post hysterectomy. Other: No free air or significant free fluid Musculoskeletal: Postsurgical changes at L4-L5. IMPRESSION: 1. Punctate density in the low right pelvis is questionable for a tiny stone in the distal right ureter about a cm proximal to the UVJ. No hydronephrosis. No hydroureter. 2. Negative appendix Electronically Signed   By: Donavan Foil M.D.   On: 05/01/2017 21:59     EKG: Not done in ED, will get one.   Assessment/Plan Principal Problem:   Acute renal failure superimposed on stage 4 chronic kidney disease (HCC) Active Problems:   Diabetes ([place])   ANEMIA-NOS   Depression   Essential hypertension   Coronary atherosclerosis   GERD   Malaise and fatigue   Hypothyroidism   Hyperkalemia   Hypotension   Sepsis ([place])   Acute renal failure superimposed on stage 4 chronic kidney disease ([place]): Likely due to UTI and prerenal secondary to dehydration and continuation of ARB, NSAIDs and trimethoprim. HTN is also possible given the hypotension. No hydronephrosis or obstructive stone by CT scan.  - will admit to tele bed as inpt - Check FeNa - Follow up renal function by BMP - Hold losartan, Mobic and trimethoprim - IVF  Hypotension and possible sepsis: Patient is clinically dehydrated, which may have caused the  hypotension, but we cannot completely rule out sepsis and possible UTI given dysuria and burning on urination. Patient meets criteria for sepsis with hypotension and elevated lactic acid. Her blood pressure responded to IV fluid, improved from 87/59 to 124/80. Currently hemodynamically stable. -will start IV aztreonam -Follow up urinalysis, urine culture and blood culture. -will get Procalcitonin and trend lactic acid levels per sepsis protocol. -IVF: total of 2.5 L of NS, followed by 125 cc/h   DM-II: Last A1c 6.9 on 11/11/16, well controled. Patient is taking victoza, NovoLog, Amaryl, Lantus at home -will decrease Lantus dose from  25-20 units daily -SSI  ANEMIA-NOS: Hemoglobin 9.9 on 03/28/16-->9.2 -Continue iron supplement -Follow-up by CBC  Depression: Stable, no suicidal or homicidal ideations. -Continue home medications: Amitriptyline, Prozac   Essential hypertension: -hold all oral Bp med due to hypotension -IV hydralazine when necessary  CAD: no CP -Continue aspirin and Zocor  GERD: -Protonix  Hyperkalemia: K5.4 -Kayexalate 45 g 1 -Check EKG  Hypothyroidism: Last TSH was on 1.33 on 11/11/16 -Continue home Synthroid  Malaise and fatigue: Etiology is not clear. Likely due to multifactorial etiology including possible UTI, hypotension, worsening renal function, electrolytes disturbance. -IV fluid as above -PT/OT -Recheck TSH and cortisol level   DVT ppx: SQ Lovenox Code Status: Full code Family Communication:  Yes, patient's husband and daughter at bed side Disposition Plan:  Anticipate discharge back to previous home environment Consults called:  none Admission status:   Inpatient/tele     Date of Service 05/01/2017  Ivor Costa [facility] Pager [phone]  If 7PM-7AM, please contact night-coverage [email or web address] Password [facility] 05/01/2017, 11:58 PM

## 2017-05-01 NOTE — ED Provider Notes (Signed)
[place] DEPT Provider Note   CSN: [id number] Arrival date & time: 05/01/17  1652     History   Chief Complaint Chief Complaint  Patient presents with  . Weakness  . Hypotension    HPI [name] is a 67 y.o. female.  HPI Patient for she has had generalized weakness. She reports the symptoms were most pronounced 2023/01/16, 4 days ago. She reports at that time she had low blood pressure. She reports prior to that she was having symptoms of urinary tract infection for about a week. She reports her doctor started her on Levaquin which she was taking. She also followed up with urology and describes having had a bladder "instillation" Done on Tuesday, day before yesterday. She reports this is because she has interstitial cystitis. She reports that she was supposed to hold it in her bladder and some leaked out but she never really urinated after that. She reports she doesn't think she's had any urine output since yesterday. She reports she is having some right lower abdominal pain. She reports she's had some chills but no fever. Past Medical History:  Diagnosis Date  . Anxiety   . Arthritis   . CERVICAL RADICULOPATHY, LEFT   . CKD (chronic kidney disease)   . COMMON MIGRAINE   . CORONARY ARTERY DISEASE   . Cystitis   . DEPRESSION   . Diabetes mellitus, type II ([place])   . DIVERTICULOSIS-COLON   . Gastric ulcer 04/2008  . Gastroparesis   . GERD (gastroesophageal reflux disease)   . Hiatal hernia   . Hyperlipidemia   . Hypertension   . Hypothyroidism   . INSOMNIA-SLEEP DISORDER-UNSPEC   . Iron deficiency anemia   . Wears glasses     Patient Active Problem List   Diagnosis Date Noted  . Weakness 05/01/2017  . Hypothyroidism 05/01/2017  . Hypokalemia 05/01/2017  . Acute renal failure superimposed on stage 4 chronic kidney disease ([place]) 05/01/2017  . Hypotension 05/01/2017  . Sepsis ([place]) 05/01/2017  . Dysuria 04/23/2017  . Right leg swelling 12/27/2016  . Leg pain,  lateral, right 11/05/2016  . Radiculopathy 04/03/2016  . Hypersomnolence 11/23/2015  . Right sided abdominal pain 07/25/2015  . Malaise and fatigue 11/05/2014  . Fracture of fifth metacarpal bone of right hand 07/08/2014  . Right cervical radiculopathy 06/27/2014  . Chronic interstitial cystitis 05/23/2014  . Osteoarthritis of [place] joint of thumb 04/11/2014  . De Quervain's tenosynovitis, left 03/14/2014  . CKD (chronic kidney disease) 03/03/2014  . Lower back pain 03/03/2014  . Ingrown nail 03/03/2014  . Peripheral edema 02/23/2014  . Lateral epicondylitis of right elbow 01/31/2014  . Neck pain on left side 11/19/2013  . Abdominal discomfort 10/12/2012  . Right lumbar radiculopathy 05/11/2012  . Peripheral neuropathy ([place]) 05/11/2012  . Vertigo 03/29/2012  . Headache(784.0) 03/10/2012  . Lumbar radiculopathy 10/24/2011  . 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 Right leg weakness 04/04/2011  . Hematochezia 01/07/2011  . Preventative health care 01/07/2011  . CHOLELITHIASIS 05/04/2009  . GERD 04/26/2009  . Gastroparesis 04/26/2009  . ULCER-GASTRIC 03/15/2009  . DIVERTICULOSIS-COLON 03/15/2009  . Cervical radiculopathy 10/28/2008  . MENOPAUSAL DISORDER 06/29/2008  . Pain in joint, shoulder region 10/12/2007  . INSOMNIA-SLEEP DISORDER-UNSPEC 09/01/2007  . Depression 09/01/2007  . ANEMIA-NOS 08/13/2007  . COMMON MIGRAINE 08/13/2007  . 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 Diabetes ([place]) 05/06/2007  . HLD (hyperlipidemia) 05/06/2007  . ANXIETY 05/06/2007  . Essential hypertension 05/06/2007  . Coronary atherosclerosis 05/06/2007  Past Surgical History:  Procedure Laterality Date  . ABDOMINAL HYSTERECTOMY    . BACK SURGERY  03/2016  . CHOLECYSTECTOMY  06/2009  . COLONOSCOPY    . ESOPHAGOGASTRODUODENOSCOPY    . EYE SURGERY Bilateral    lasik  . Gastric Wedge resection lipoma  11/2007   x2 with laparotomy and gastrostomy  . Left knee replacement    . Rigth  knee replacement with revision  04/2008   Dr. Berenice Primas  . ROTATOR CUFF REPAIR Left 01/2009  . s/p bladder surgury  09/2009   Dr. Terance Hart    OB History    Gravida Para Term Preterm AB Living   3 3       3    SAB TAB Ectopic Multiple Live Births                   Home Medications    Prior to Admission medications   Medication Sig Start Date End Date Taking? Authorizing Provider  amitriptyline (ELAVIL) 10 MG tablet Take 10 mg by mouth at bedtime.  07/26/15  Yes [provider]  amLODipine-valsartan (EXFORGE) 10-320 MG tablet Take 1 tablet by mouth daily. Patient taking differently: Take 1 tablet by mouth at bedtime.  02/27/16  Yes Biagio Borg, MD  aspirin 81 MG EC tablet Take 81 mg by mouth every evening.    Yes [provider]  cloNIDine (CATAPRES) 0.1 MG tablet TAKE 1 TABLET (0.1 MG TOTAL) BY MOUTH 2 (TWO) TIMES DAILY. 04/28/17  Yes Elayne Snare, MD  ELMIRON 100 MG capsule Take 100 mg by mouth 2 (two) times daily.  07/20/13  Yes [provider]  Estradiol Acetate 0.1 MG/24HR RING Place 1 each vaginally every 3 (three) months. 02/07/17  Yes Terrance Mass, MD  ferrous sulfate 325 (65 FE) MG tablet Take 325 mg by mouth 2 (two) times daily.  01/21/17  Yes [provider]  FLUoxetine (PROZAC) 20 MG capsule TAKE ONE CAPSULE BY MOUTH DAILY Patient taking differently: Take 20 mg by mouth at bedtime 03/06/17  Yes Biagio Borg, MD  glimepiride (AMARYL) 1 MG tablet TAKE 1 TABLET BY MOUTH WITH SUPPER ONCE A DAY PER DR. Dwyane Dee Patient taking differently: Take 1 mg by mouth with supper if BGL is 250 or greater 09/06/15  Yes Elayne Snare, MD  HYDROcodone-acetaminophen (NORCO/VICODIN) 5-325 MG tablet Take 1 tablet by mouth every 6 (six) hours as needed for moderate pain or severe pain.  12/23/16  Yes [provider]  insulin aspart (NOVOLOG FLEXPEN) 100 UNIT/ML FlexPen Inject 10 Units into the skin 3 (three) times daily with meals.   Yes [provider]    insulin glargine (LANTUS) 100 UNIT/ML injection Inject 25 Units into the skin at bedtime.   Yes [provider]  isosorbide dinitrate (ISORDIL) 10 MG tablet TAKE ONE TABLET BY MOUTH DAILY Patient taking differently: Take 10 mg by mouth once a day 08/01/14  Yes Biagio Borg, MD  lansoprazole (PREVACID) 15 MG capsule Take 15 mg by mouth every evening.    Yes [provider]  levETIRAcetam (KEPPRA) 250 MG tablet Take 250 mg by mouth 2 (two) times daily. 04/24/17  Yes [provider]  levothyroxine (SYNTHROID, LEVOTHROID) 88 MCG tablet TAKE ONE TABLET BY MOUTH DAILY Patient taking differently: Take 88 mcg by mouth in the morning 04/28/17  Yes Elayne Snare, MD  linaclotide [facility]) 72 MCG capsule Take 1 capsule (72 mcg total) by mouth daily before breakfast. Patient taking differently: Take  72 mcg by mouth daily as needed (for constipation).  11/21/16  Yes Ladene Artist, MD  meloxicam (MOBIC) 15 MG tablet Take 15 mg by mouth every evening.  12/23/16  Yes [provider]  ondansetron (ZOFRAN) 4 MG tablet TAKE 1 TABLET BY MOUTH EVERY 6 HOURS AS NEEDED FOR NAUSEA Patient taking differently: Take 4 mg by mouth every six hours as needed for nausea 10/11/16  Yes Biagio Borg, MD  simvastatin (ZOCOR) 20 MG tablet TAKE ONE TABLET BY MOUTH DAILY. OVERDUE FOR YEARLY PHYSICAL WITH LABS MUST SEE M.D. FOR REFILLS Patient taking differently: Take 20 mg by mouth in the evening 01/06/17  Yes Biagio Borg, MD  trimethoprim (TRIMPEX) 100 MG tablet Take 100 mg by mouth every evening.  08/08/14  Yes [provider]  venlafaxine XR (EFFEXOR-XR) 37.5 MG 24 hr capsule Take 2 capsules (75 mg total) by mouth daily with breakfast. 12/03/16  Yes Lyndal Pulley, DO  VICTOZA 18 MG/3ML SOPN Inject 0.2 mLs (1.2 mg total) into the skin daily. Inject once daily at the same time Patient taking differently: Inject 1.2 mg into the skin at bedtime.  08/15/16  Yes Elayne Snare, MD  estradiol  (ESTRACE) 2 MG tablet TAKE 1 TABLET (2 MG TOTAL) BY MOUTH DAILY. Patient not taking: Reported on 05/01/2017 08/05/16   Terrance Mass, MD  glucose blood (ACCU-CHEK AVIVA PLUS) test strip Use to test blood sugar 3 times daily- Dx code E11.9 11/13/16   Elayne Snare, MD  levofloxacin (LEVAQUIN) 500 MG tablet Take 1 tablet (500 mg total) by mouth daily. Patient not taking: Reported on 05/01/2017 04/23/17 05/03/17  Biagio Borg, MD  metFORMIN (GLUCOPHAGE-XR) 500 MG 24 hr tablet TAKE 1 TABLET (500 MG TOTAL) BY MOUTH 2 (TWO) TIMES DAILY. Patient not taking: Reported on 05/01/2017 04/30/16   Elayne Snare, MD  phenazopyridine (PYRIDIUM) 100 MG tablet Take 1 tablet (100 mg total) by mouth 3 (three) times daily as needed for pain. Patient not taking: Reported on 05/01/2017 04/23/17   Biagio Borg, MD    Family History Family History  Problem Relation Age of Onset  . Diabetes Sister        x 3  . Heart disease Sister        x2  . Diabetes Brother        x3  . Heart disease Brother        x2  . Coronary artery disease Other        female 1st degree  . Hypertension Other   . Colon cancer Neg Hx     Social History Social History  Substance Use Topics  . Smoking status: Former Research scientist (life sciences)  . Smokeless tobacco: Never Used     Comment: quit 30 years ago  . Alcohol use No     Allergies   Hydrocodone; Ciprofloxacin; and Penicillins   Review of Systems Review of Systems 10 Systems reviewed and are negative for acute change except as noted in the HPI.  Physical Exam Updated Vital Signs BP 130/63   Pulse 73   Temp 97.9 F (36.6 C) (Oral)   Resp 16   Wt 126.1 kg (278 lb)   SpO2 100%   BMI 44.87 kg/m   Physical Exam  Constitutional: She is oriented to person, place, and time.  Patient is alert and nontoxic. No respiratory distress. Morbid obesity.  HENT:  Head: Normocephalic and atraumatic.  Mouth/Throat: Oropharynx is clear and moist.  Eyes: Pupils are  equal, round, and reactive to light.  EOM are normal.  Neck: Neck supple.  Cardiovascular: Normal rate, regular rhythm, normal heart sounds and intact distal pulses.   Pulmonary/Chest: Effort normal and breath sounds normal.  Abdominal: Soft. She exhibits no distension. There is tenderness.  Patient endorses tenderness in the right lower quadrant. No guarding.  Musculoskeletal: Normal range of motion. She exhibits no edema or tenderness.  Neurological: She is alert and oriented to person, place, and time. No cranial nerve deficit. She exhibits normal muscle tone. Coordination normal.  Patient has nonfocal neurologic exam. She does have range of motion issues relating to obesity and deconditioning.  Skin: Skin is warm and dry.  Psychiatric: She has a normal mood and affect.     ED Treatments / Results  Labs (all labs ordered are listed, but only abnormal results are displayed) Labs Reviewed  COMPREHENSIVE METABOLIC PANEL - Abnormal; Notable for the following:       Result Value   Sodium 130 (*)    Potassium 5.4 (*)    CO2 16 (*)    Glucose, Bld 422 (*)    BUN 56 (*)    Creatinine, Ser 5.29 (*)    AST 11 (*)    ALT 9 (*)    GFR calc non Af Amer 8 (*)    GFR calc Af Amer 9 (*)    All other components within normal limits  CBC WITH DIFFERENTIAL/PLATELET - Abnormal; Notable for the following:    RBC 3.03 (*)    Hemoglobin 9.0 (*)    HCT 28.8 (*)    All other components within normal limits  PROTIME-INR - Abnormal; Notable for the following:    Prothrombin Time 17.6 (*)    All other components within normal limits  URINALYSIS, ROUTINE W REFLEX MICROSCOPIC - Abnormal; Notable for the following:    Color, Urine AMBER (*)    Glucose, UA 50 (*)    Nitrite POSITIVE (*)    Bacteria, UA RARE (*)    Squamous Epithelial / LPF 0-5 (*)    All other components within normal limits  I-STAT CG4 LACTIC ACID, ED - Abnormal; Notable for the following:    Lactic Acid, Venous 2.54 (*)    All other components within normal limits    CULTURE, BLOOD (ROUTINE X 2)  CULTURE, BLOOD (ROUTINE X 2)  URINE CULTURE  CREATININE, URINE, RANDOM  SODIUM, URINE, RANDOM  TSH  LACTIC ACID, PLASMA  LACTIC ACID, PLASMA  PROCALCITONIN  CORTISOL-AM, BLOOD  BASIC METABOLIC PANEL  CBC  I-STAT CG4 LACTIC ACID, ED    EKG  EKG Interpretation None       Radiology Dg Chest 2 View  Result Date: 05/01/2017 CLINICAL DATA:  Sepsis.  Feeling sick for 2 weeks. EXAM: CHEST  2 VIEW COMPARISON:  March 28, 2016 FINDINGS: The heart size and mediastinal contours are within normal limits. There is no focal infiltrate, pulmonary edema, or pleural effusion. The visualized skeletal structures are unremarkable. IMPRESSION: No active cardiopulmonary disease. Electronically Signed   By: Abelardo Diesel M.D.   On: 05/01/2017 18:12   Ct Renal Stone Study  Result Date: 05/01/2017 CLINICAL DATA:  Right lower quadrant pain and renal failure, nausea vomiting and diarrhea EXAM: CT ABDOMEN AND PELVIS WITHOUT CONTRAST TECHNIQUE: Multidetector CT imaging of the abdomen and pelvis was performed following the standard protocol without IV contrast. COMPARISON:  02/26/2017, 01/26/2017 FINDINGS: Lower chest: Lung bases demonstrate no acute consolidation or pleural effusion. Heart size nonenlarged.  Hepatobiliary: No focal liver abnormality is seen. Status post cholecystectomy. No biliary dilatation. Pancreas: Unremarkable. No pancreatic ductal dilatation or surrounding inflammatory changes. Spleen: Normal in size without focal abnormality. Adrenals/Urinary Tract: Adrenal glands are within normal limits. No hydronephrosis or hydroureter. Punctate density in the right low pelvis is questionable for a tiny stone in the ureter. Bladder otherwise normal Stomach/Bowel: Postsurgical changes of the stomach. Appendix appears normal. No evidence of bowel wall thickening, distention, or inflammatory changes. Vascular/Lymphatic: Aortic atherosclerosis. No enlarged abdominal or pelvic lymph  nodes. Reproductive: Circular ring like structure in the vagina. No adnexal masses. Status post hysterectomy. Other: No free air or significant free fluid Musculoskeletal: Postsurgical changes at L4-L5. IMPRESSION: 1. Punctate density in the low right pelvis is questionable for a tiny stone in the distal right ureter about a cm proximal to the UVJ. No hydronephrosis. No hydroureter. 2. Negative appendix Electronically Signed   By: Donavan Foil M.D.   On: 05/01/2017 21:59    Procedures Procedures (including critical care time)  Medications Ordered in ED Medications  0.9 %  sodium chloride infusion (not administered)  sodium polystyrene (KAYEXALATE) 15 GM/60ML suspension 45 g (not administered)  levETIRAcetam (KEPPRA) tablet 250 mg (not administered)  levothyroxine (SYNTHROID, LEVOTHROID) tablet 88 mcg (not administered)  FLUoxetine (PROZAC) capsule 20 mg (not administered)  Estradiol Acetate RING 1 each (not administered)  insulin glargine (LANTUS) injection 20 Units (not administered)  ferrous sulfate tablet 325 mg (not administered)  HYDROcodone-acetaminophen (NORCO/VICODIN) 5-325 MG per tablet 1 tablet (not administered)  simvastatin (ZOCOR) tablet 20 mg (not administered)  venlafaxine XR (EFFEXOR-XR) 24 hr capsule 75 mg (not administered)  pantoprazole (PROTONIX) EC tablet 20 mg (not administered)  linaclotide (LINZESS) capsule 72 mcg (not administered)  amitriptyline (ELAVIL) tablet 10 mg (not administered)  isosorbide dinitrate (ISORDIL) tablet 10 mg (not administered)  pentosan polysulfate (ELMIRON) capsule 100 mg (not administered)  aspirin EC tablet 81 mg (not administered)  sodium chloride 0.9 % bolus 1,500 mL (not administered)  insulin aspart (novoLOG) injection 0-9 Units (not administered)  hydrALAZINE (APRESOLINE) injection 5 mg (not administered)  ondansetron (ZOFRAN) injection 4 mg (not administered)  enoxaparin (LOVENOX) injection 40 mg (not administered)  acetaminophen  (TYLENOL) tablet 650 mg (not administered)    Or  acetaminophen (TYLENOL) suppository 650 mg (not administered)  zolpidem (AMBIEN) tablet 5 mg (not administered)  aztreonam (AZACTAM) 500 mg in dextrose 5 % 50 mL IVPB (not administered)  sodium chloride 0.9 % bolus 500 mL (0 mLs Intravenous Stopped 05/01/17 2117)  0.9 %  sodium chloride infusion ( Intravenous New Bag/Given 05/01/17 2047)  sodium chloride 0.9 % bolus 500 mL (0 mLs Intravenous Stopped 05/01/17 2304)  insulin aspart (novoLOG) injection 12 Units (12 Units Subcutaneous Given 05/01/17 2232)     Initial Impression / Assessment and Plan / ED Course  I have reviewed the triage vital signs and the nursing notes.  Pertinent labs & imaging results that were available during my care of the patient were reviewed by me and considered in my medical decision making (see chart for details).    Consult: Dr. Donna Bernard for admission. Final Clinical Impressions(s) / ED Diagnoses   Final diagnoses:  Acute renal failure, unspecified acute renal failure type ([place])  At this time, it is unclear why patient has acute renal failure. She does not bladder obstruction. Both CT and bladder scan did not show large bladder distention. She has been taking Levaquin for suspected UTI. Patient's husband reports she's had significantly limited amount  of oral intake of fluids. Dehydration may be an element of renal insufficiency however numbers are more elevated than would be suspected. Possibly also secondary to NSAID use. Patient will be admitted for ongoing hydration and diagnostic evaluation. No signs of sepsis. She is alert and appropriate. She is afebrile. No leukocytosis.  New Prescriptions New Prescriptions   No medications on file     Charlesetta Shanks, MD 05/01/17 2355

## 2017-05-02 ENCOUNTER — Encounter (HOSPITAL_COMMUNITY): Payer: Self-pay | Admitting: *Deleted

## 2017-05-02 DIAGNOSIS — R5383 Other fatigue: Secondary | ICD-10-CM

## 2017-05-02 DIAGNOSIS — N184 Chronic kidney disease, stage 4 (severe): Secondary | ICD-10-CM

## 2017-05-02 DIAGNOSIS — F329 Major depressive disorder, single episode, unspecified: Secondary | ICD-10-CM

## 2017-05-02 DIAGNOSIS — N179 Acute kidney failure, unspecified: Secondary | ICD-10-CM

## 2017-05-02 DIAGNOSIS — E872 Acidosis: Secondary | ICD-10-CM

## 2017-05-02 DIAGNOSIS — R5381 Other malaise: Secondary | ICD-10-CM

## 2017-05-02 DIAGNOSIS — I10 Essential (primary) hypertension: Secondary | ICD-10-CM

## 2017-05-02 DIAGNOSIS — E875 Hyperkalemia: Secondary | ICD-10-CM

## 2017-05-02 DIAGNOSIS — K219 Gastro-esophageal reflux disease without esophagitis: Secondary | ICD-10-CM

## 2017-05-02 DIAGNOSIS — E0822 Diabetes mellitus due to underlying condition with diabetic chronic kidney disease: Secondary | ICD-10-CM

## 2017-05-02 DIAGNOSIS — I2510 Atherosclerotic heart disease of native coronary artery without angina pectoris: Secondary | ICD-10-CM

## 2017-05-02 DIAGNOSIS — I959 Hypotension, unspecified: Secondary | ICD-10-CM

## 2017-05-02 DIAGNOSIS — Z794 Long term (current) use of insulin: Secondary | ICD-10-CM

## 2017-05-02 DIAGNOSIS — E039 Hypothyroidism, unspecified: Secondary | ICD-10-CM

## 2017-05-02 DIAGNOSIS — D649 Anemia, unspecified: Secondary | ICD-10-CM

## 2017-05-02 LAB — CORTISOL-AM, BLOOD: Cortisol - AM: 8.9 ug/dL (ref 6.7–22.6)

## 2017-05-02 LAB — CBC
HCT: 27.6 % — ABNORMAL LOW (ref 36.0–46.0)
Hemoglobin: 8.9 g/dL — ABNORMAL LOW (ref 12.0–15.0)
MCH: 30.5 pg (ref 26.0–34.0)
MCHC: 32.2 g/dL (ref 30.0–36.0)
MCV: 94.5 fL (ref 78.0–100.0)
Platelets: 202 10*3/uL (ref 150–400)
RBC: 2.92 MIL/uL — ABNORMAL LOW (ref 3.87–5.11)
RDW: 13 % (ref 11.5–15.5)
WBC: 8.7 10*3/uL (ref 4.0–10.5)

## 2017-05-02 LAB — BASIC METABOLIC PANEL
Anion gap: 8 (ref 5–15)
BUN: 46 mg/dL — ABNORMAL HIGH (ref 6–20)
CO2: 19 mmol/L — ABNORMAL LOW (ref 22–32)
Calcium: 8.8 mg/dL — ABNORMAL LOW (ref 8.9–10.3)
Chloride: 111 mmol/L (ref 101–111)
Creatinine, Ser: 3.86 mg/dL — ABNORMAL HIGH (ref 0.44–1.00)
GFR calc Af Amer: 13 mL/min — ABNORMAL LOW (ref >60)
GFR calc non Af Amer: 11 mL/min — ABNORMAL LOW (ref >60)
Glucose, Bld: 139 mg/dL — ABNORMAL HIGH (ref 65–99)
Potassium: 4.3 mmol/L (ref 3.5–5.1)
Sodium: 138 mmol/L (ref 135–145)

## 2017-05-02 LAB — GLUCOSE, CAPILLARY
Glucose-Capillary: 199 mg/dL — ABNORMAL HIGH (ref 65–99)
Glucose-Capillary: 121 mg/dL — ABNORMAL HIGH (ref 65–99)
Glucose-Capillary: 210 mg/dL — ABNORMAL HIGH (ref 65–99)
Glucose-Capillary: 275 mg/dL — ABNORMAL HIGH (ref 65–99)
Glucose-Capillary: 289 mg/dL — ABNORMAL HIGH (ref 65–99)

## 2017-05-02 LAB — LACTIC ACID, PLASMA: Lactic Acid, Venous: 1.9 mmol/L (ref 0.5–1.9)

## 2017-05-02 LAB — PROCALCITONIN: Procalcitonin: <0.1 ng/mL

## 2017-05-02 LAB — TSH: TSH: 22.77 u[IU]/mL — ABNORMAL HIGH (ref 0.350–4.500)

## 2017-05-02 LAB — T4, FREE: Free T4: 0.64 ng/dL (ref 0.61–1.12)

## 2017-05-02 LAB — SODIUM, URINE, RANDOM: Sodium, Ur: 24 mmol/L

## 2017-05-02 LAB — CBG MONITORING, ED: Glucose-Capillary: 241 mg/dL — ABNORMAL HIGH (ref 65–99)

## 2017-05-02 LAB — CREATININE, URINE, RANDOM: Creatinine, Urine: 286.23 mg/dL

## 2017-05-02 MED ORDER — PNEUMOCOCCAL VAC POLYVALENT 25 MCG/0.5ML IJ INJ
0.5000 mL | INJECTION | INTRAMUSCULAR | Status: AC
  Administered 2017-05-03: 0.5 mL via INTRAMUSCULAR
  Filled 2017-05-02: qty 0.5

## 2017-05-02 MED ORDER — DEXTROSE 5 % IV SOLN
1.0000 g | INTRAVENOUS | Status: DC
  Administered 2017-05-02: 1 g via INTRAVENOUS
  Filled 2017-05-02 (×2): qty 10

## 2017-05-02 NOTE — Evaluation (Signed)
Physical Therapy Evaluation Patient Details Name: [name] MRN: [id number] DOB: 05-06-1951 Today's Date: 05/02/2017   History of Present Illness  66 y.o. female with medical history significant of interstitial cystitis, hypertension, hyperlipidemia, diabetes mellitus, GERD, hypothyroidism, depression, iron deficiency anemia, gastric ulcer, gastroparesis, COPD, CKD-4, who presents with generalized weakness, dysuria and burning on urination.  Clinical Impression  Pt admitted with above, presents with generalized weakness and debility as detailed below.  Recommend continued skilled PT while admitted for progression of strength and activity tolerance, and follow up with HHPT and supervision for mobility at home once d/c.      Follow Up Recommendations Home health PT;Supervision for mobility/OOB    Equipment Recommendations  None recommended by PT    Recommendations for Other Services       Precautions / Restrictions Precautions Precautions: Fall Restrictions Weight Bearing Restrictions: No      Mobility  Bed Mobility Overal bed mobility: Needs Assistance Bed Mobility: Supine to Sit     Supine to sit: Min guard     General bed mobility comments: in chair on arrival  Transfers Overall transfer level: Needs assistance Equipment used: Rolling walker (2 wheeled) Transfers: Sit to/from Stand Sit to Stand: Mod assist (from low recliner)         General transfer comment: Cues for hand placement and forward weight shift  Ambulation/Gait Ambulation/Gait assistance: Min assist;Min guard Ambulation Distance (Feet): 20 Feet Assistive device: Rolling walker (2 wheeled) Gait Pattern/deviations: Step-through pattern;Wide base of support   Gait velocity interpretation: Below normal speed for age/gender General Gait Details: very slow, several rest breaks while ambulating to/from doorway  Stairs            Wheelchair Mobility    Modified Rankin (Stroke Patients  Only)       Balance Overall balance assessment: Needs assistance Sitting-balance support: Feet supported Sitting balance-Leahy Scale: Fair     Standing balance support: Bilateral upper extremity supported Standing balance-Leahy Scale: Poor                               Pertinent Vitals/Pain Pain Assessment: No/denies pain Faces Pain Scale: Hurts little more Pain Location: abdomen Pain Descriptors / Indicators: Burning;Aching;Sore Pain Intervention(s): Monitored during session;Limited activity within patient's tolerance;Repositioned    Home Living Family/patient expects to be discharged to:: Private residence Living Arrangements: Spouse/significant other Available Help at Discharge: Family;Available 24 hours/day Type of Home: House Home Access: Stairs to enter Entrance Stairs-Rails: Psychiatric nurse of Steps: 4 Home Layout: One level Home Equipment: Walker - 2 wheels;Walker - 4 wheels;Bedside commode      Prior Function Level of Independence: Needs assistance   Gait / Transfers Assistance Needed: supervision/min guard     Comments: cane vs rollator for mobility     Hand Dominance        Extremity/Trunk Assessment   Upper Extremity Assessment Upper Extremity Assessment: Generalized weakness    Lower Extremity Assessment Lower Extremity Assessment: Generalized weakness       Communication   Communication: No difficulties  Cognition Arousal/Alertness: Awake/alert Behavior During Therapy: WFL for tasks assessed/performed Overall Cognitive Status: Within Functional Limits for tasks assessed                                        General Comments      Exercises  Assessment/Plan    PT Assessment Patient needs continued PT services  PT Problem List Decreased activity tolerance;Decreased mobility       PT Treatment Interventions DME instruction;Gait training;Stair training;Therapeutic  exercise;Patient/family education    PT Goals (Current goals can be found in the Care Plan section)  Acute Rehab PT Goals Patient Stated Goal: feel better PT Goal Formulation: With patient Time For Goal Achievement: 05/09/17 Potential to Achieve Goals: Good    Frequency Min 3X/week   Barriers to discharge        Co-evaluation               AM-PAC PT "6 Clicks" Daily Activity  Outcome Measure Difficulty turning over in bed (including adjusting bedclothes, sheets and blankets)?: A Little Difficulty moving from lying on back to sitting on the side of the bed? : A Little Difficulty sitting down on and standing up from a chair with arms (e.g., wheelchair, bedside commode, etc,.)?: A Little Help needed moving to and from a bed to chair (including a wheelchair)?: A Little Help needed walking in hospital room?: A Lot Help needed climbing 3-5 steps with a railing? : A Lot 6 Click Score: 16    End of Session   Activity Tolerance: Patient limited by fatigue Patient left: in chair;with call bell/phone within reach;with family/visitor present   PT Visit Diagnosis: Muscle weakness (generalized) (M62.81)    Time: 1165-7903 PT Time Calculation (min) (ACUTE ONLY): 20 min   Charges:   PT Evaluation $PT Eval Low Complexity: 1 Procedure     PT G Codes:          Azarion Hove E Penven-Crew 05/02/2017, 3:35 PM

## 2017-05-02 NOTE — Consult Note (Signed)
[facility] CM Primary Care Navigator  05/02/2017  [name] November 14, 1950 [id number]   Met with patient, husband [name]) and daughter [name]) at the bedside to identify possible discharge needs. Patient reports being weak, having nausea, loss of appetite, fever/ chills and pain to lower abdomen that had led to this admission. Patient endorsesDr. Cathlean Cower with [place] [facility] as the primary care provider.   Patient shared using [facility]at [address] and [address]  to obtain medications with issues of affordability and management.   Patient states managing her medications at home using "pill box" system filled monthly.  Patient reports that husband provides transportation to her doctors' appointments.  She mentioned that her husband is the primary caregiver at home. Their daughter [name]) lives with them and able to assist with care at home as well.  Anticipated plan for discharge is home per patient. Still awaiting for PT evaluation and recommendation.  Patient voiced understanding to call primary care provider's office once she returns home, for a post discharge follow-up appointment within a week or sooner if needed.Patient letter (with PCP's contact number) was provided as a reminder.  Explained to patient about [facility] CM services available for healthmanagement and patient verbalized need for medication assistance and management particularly insulins (Victoza, Lantus and Novolog); Elmiron, Clonidine and IUD. Patient verbally agreed to be referred to [place] and be  assisted with medication issues.  [facility] made for follow-up after discharge. [facility] care management contact information provided for future needs that may arise.    For questions, please contact:  Dannielle Huh, BSN, RN- [facility] Primary Care Navigator  Telephone: [phone] [place]

## 2017-05-02 NOTE — ED Notes (Signed)
Attempted to call report

## 2017-05-02 NOTE — Progress Notes (Signed)
PROGRESS NOTE    [name]  QMV:[id number] DOB: 1951-06-28 DOA: 05/01/2017 PCP: Biagio Borg, MD   Chief Complaint  Patient presents with  . Weakness  . Hypotension    Brief Narrative:  HPI on 05/01/17 by Dr. Ashley Akin is a 66 y.o. female with medical history significant of interstitial cystitis, hypertension, hyperlipidemia, diabetes mellitus, GERD, hypothyroidism, depression, iron deficiency anemia, gastric ulcer, gastroparesis, COPD, CKD-4, who presents with generalized weakness, dysuria and burning on urination. Patient states that she was recently treated by her doctor with Levaquin for UTI. She completed 7 day course of treatment yesterday. She states that she her symptoms have improved, but still has dysuria and burning on urination.  She also followed up with urology due to interstitial cystitis. She states that she had a bladder "instillation" done on Tuesday by her urologist, "putting numb medication to her bladder". Patient states that she has a generalized weakness, but no unilateral weakness, numbness or tingling to extremities. She has decreased oral intake and poor appetite. She also has decreased urine output. Patient had nausea and vomiting on "Sunday, which has resolved. She also had mild lower abdominal pain earlier, which has resolved. Currently no nausea, vomiting, abdominal pain, diarrhea. She has chills, but no fever.  Assessment & Plan   Urinary tract infection with lactic acidosis and hypotension -Unfortunately, patient did not meet sepsis criteria based on hypotension and elevated lactic acid. 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 -Continue iron supplementation -Currently hemoglobin 8.9, continue to monitor CBC  Depression  -Continue prozac, Effexor, amitriptyline  Essential hypertension -presented with hypotension secondary to sepsis -antihypretensive meds held -Continue IV hydralazine PRN  CAD -Continue statin and aspirin -Currently no chest pain  Hypothyroidism -Continue synthroid -TSH 2.7, will obtain free T4 -Likely will not make any alterations patient's thyroid medication as she is currently having acute illness -recommend patient have repeat outpatient studies in 3-4 weeks once her illness has resolved.  Malaise and fatigue -Likely secondary to infection  -OT recommended HH -Pending PT evaluation  DVT Prophylaxis  lovenox  Code Status: Full  Family Communication: none at bedside  Disposition Plan: Admitted. Pending improvement and PT eval.   Consultants None  Procedures  none  Antibiotics   Anti-infectives    Start     Dose/Rate Route Frequency Ordered Stop   05/02/17 2200  cefTRIAXone (ROCEPHIN) 1 g in dextrose 5 % 50 mL IVPB     1 g 100 mL/hr over 30 Minutes Intravenous Every 24 hours 05/02/17 1416     05/02/17 0030  aztreonam (AZACTAM) 500 mg in dextrose 5 % 50 mL IVPB  Status:  Discontinued     50" 0 mg 100 mL/hr over 30 Minutes Intravenous Every 8 hours 05/01/17 2352  05/02/17 1414      Subjective:   [name] seen and examined today.  Patient  states she's feeling better but continues to feel weak and not back to her baseline. Denies any current chest pain, shortness breath, abdominal pain, nausea or vomiting, diarrhea or constipation, dizziness or headache.   Objective:   Vitals:   05/02/17 0100 05/02/17 0148 05/02/17 0605 05/02/17 0932  BP: 134/67 140/67 123/61 139/61  Pulse: 71 78 97 78  Resp: 13 19 20 18   Temp:  98.4 F (36.9 C) 98.6 F (37 C) 98.5 F (36.9 C)  TempSrc:  Oral Oral Oral  SpO2: 100% 100% 100% 100%  Weight:  126.2 kg (278 lb 3.5 oz)    Height:  5\' 6"  (1.676 m)      Intake/Output Summary (Last 24 hours) at 05/02/17 1448 Last data filed at 05/02/17 1000  Gross per 24 hour  Intake             1670 ml  Output             1450 ml  Net              220 ml   Filed Weights   05/01/17 1823 05/02/17 0148  Weight: 126.1 kg (278 lb) 126.2 kg (278 lb 3.5 oz)    Exam  General: Well developed, well nourished, NAD, appears stated age  HEENT: NCAT, mucous membranes moist.   Cardiovascular: S1 S2 auscultated, no rubs, murmurs or gallops. Regular rate and rhythm.  Respiratory: Clear to auscultation bilaterally with equal chest rise  Abdomen: Soft, nontender, nondistended, + bowel sounds  Extremities: warm dry without cyanosis clubbing or edema  Neuro: AAOx3, nonfocal  Psych: Normal affect and demeanor with intact judgement and insight   Data Reviewed: I have personally reviewed following labs and imaging studies  CBC:  Recent Labs Lab 05/01/17 1718 05/02/17 0212  WBC 7.9 8.7  NEUTROABS 5.3  --   HGB 9.0* 8.9*  HCT 28.8* 27.6*  MCV 95.0 94.5  PLT 228 025   Basic Metabolic Panel:  Recent Labs Lab 05/01/17 1718 05/02/17 0212  NA 130* 138  K 5.4* 4.3  CL 103 111  CO2 16* 19*  GLUCOSE 422* 139*  BUN 56* 46*  CREATININE 5.29* 3.86*  CALCIUM 9.3 8.8*   GFR: Estimated Creatinine Clearance: 19.2 mL/min (A) (by C-G formula based on SCr of 3.86 mg/dL (H)). Liver Function  Tests:  Recent Labs Lab 05/01/17 1718  AST 11*  ALT 9*  ALKPHOS 77  BILITOT 1.2  PROT 6.6  ALBUMIN 3.5   No results for input(s): LIPASE, AMYLASE in the last 168 hours. No results for input(s): AMMONIA in the last 168 hours. Coagulation Profile:  Recent Labs Lab 05/01/17 1718  INR 1.43   Cardiac Enzymes: No results for input(s): CKTOTAL, CKMB, CKMBINDEX, TROPONINI in the last 168 hours. BNP (last 3 results) No results for input(s): PROBNP in the last 8760 hours. HbA1C: No results for input(s): HGBA1C in the last 72 hours. CBG:  Recent Labs Lab 05/02/17 0037 05/02/17 0136 05/02/17 0731 05/02/17 1211  GLUCAP 241* 199* 121* 210*   Lipid Profile: No results for input(s): CHOL, HDL, LDLCALC, TRIG, CHOLHDL, LDLDIRECT in the last 72 hours. Thyroid Function Tests:  Recent Labs  05/02/17 0212 05/02/17 1000  TSH 22.770*  --   FREET4  --  0.64   Anemia Panel: No results for input(s): VITAMINB12, FOLATE, FERRITIN, TIBC, IRON, RETICCTPCT in the last 72 hours.  Urine analysis:    Component Value Date/Time   COLORURINE AMBER (A) 05/01/2017 2300   APPEARANCEUR CLEAR 05/01/2017 2300   LABSPEC 1.017 05/01/2017 2300   PHURINE 5.0 05/01/2017 2300   GLUCOSEU 50 (A) 05/01/2017 2300   GLUCOSEU NEGATIVE 07/25/2015 1140   HGBUR NEGATIVE 05/01/2017 2300   BILIRUBINUR NEGATIVE 05/01/2017 2300   BILIRUBINUR neg 04/23/2017 1848   KETONESUR NEGATIVE 05/01/2017 2300   PROTEINUR NEGATIVE 05/01/2017 2300   UROBILINOGEN 0.2 04/23/2017 1848   UROBILINOGEN 0.2 07/25/2015 1140   NITRITE POSITIVE (A) 05/01/2017 2300   LEUKOCYTESUR NEGATIVE 05/01/2017 2300   Sepsis Labs: @LABRCNTIP (procalcitonin:4,lacticidven:4)  ) Recent Results (from the past 240 hour(s))  Culture, blood (Routine x 2)     Status: None (Preliminary result)   Collection Time: 05/01/17  5:02 PM  Result Value Ref Range Status   Specimen Description BLOOD LEFT ANTECUBITAL  Final   Special Requests   Final     BOTTLES DRAWN AEROBIC AND ANAEROBIC Blood Culture adequate volume   Culture NO GROWTH < 24 HOURS  Final   Report Status PENDING  Incomplete  Culture, blood (Routine x 2)     Status: None (Preliminary result)   Collection Time: 05/01/17  6:43 PM  Result Value Ref Range Status   Specimen Description BLOOD LEFT HAND  Final   Special Requests   Final    BOTTLES DRAWN AEROBIC AND ANAEROBIC Blood Culture results may not be optimal due to an inadequate volume of blood received in culture bottles   Culture NO GROWTH < 24 HOURS  Final   Report Status PENDING  Incomplete      Radiology Studies: Dg Chest 2 View  Result Date: 05/01/2017 CLINICAL DATA:  Sepsis.  Feeling sick for 2 weeks. EXAM: CHEST  2 VIEW COMPARISON:  March 28, 2016 FINDINGS: The heart size and mediastinal contours are within normal limits. There is no focal infiltrate, pulmonary edema, or pleural effusion. The visualized skeletal structures are unremarkable. IMPRESSION: No active cardiopulmonary disease. Electronically Signed   By: Abelardo Diesel M.D.   On: 05/01/2017 18:12   Ct Renal Stone Study  Result Date: 05/01/2017 CLINICAL DATA:  Right lower quadrant pain and renal failure, nausea vomiting and diarrhea EXAM: CT ABDOMEN AND PELVIS WITHOUT CONTRAST TECHNIQUE: Multidetector CT imaging of the abdomen and pelvis was performed following the standard protocol without IV contrast. COMPARISON:  02/26/2017, 01/26/2017 FINDINGS: Lower chest: Lung bases demonstrate no acute consolidation or pleural effusion. Heart size nonenlarged. Hepatobiliary: No focal liver abnormality is seen. Status post cholecystectomy. No biliary dilatation. Pancreas: Unremarkable. No pancreatic ductal dilatation or surrounding inflammatory changes. Spleen: Normal in size without focal abnormality. Adrenals/Urinary Tract: Adrenal glands are within normal limits. No hydronephrosis or hydroureter. Punctate density in the right low pelvis is questionable for a tiny stone in  the ureter. Bladder otherwise normal Stomach/Bowel: Postsurgical changes of the stomach. Appendix appears normal. No evidence of bowel wall thickening, distention, or inflammatory changes. Vascular/Lymphatic: Aortic atherosclerosis. No enlarged abdominal or pelvic lymph nodes. Reproductive: Circular ring like structure in the vagina. No adnexal masses. Status post hysterectomy. Other: No free air or significant free fluid Musculoskeletal: Postsurgical changes at L4-L5. IMPRESSION: 1. Punctate density in the low right pelvis is questionable for a tiny stone in the distal right ureter about a cm proximal to the UVJ. No hydronephrosis. No hydroureter. 2. Negative appendix Electronically Signed   By: Donavan Foil M.D.   On: 05/01/2017 21:59     Scheduled Meds: . amitriptyline  10 mg Oral QHS  . aspirin EC  81 mg Oral QPM  . enoxaparin (LOVENOX) injection  30 mg Subcutaneous Q24H  . ferrous sulfate  325 mg Oral BID  . FLUoxetine  20 mg Oral Daily  . insulin aspart  0-9 Units Subcutaneous TID WC  . insulin glargine  20 Units Subcutaneous QHS  . isosorbide dinitrate  10 mg Oral Daily  . levETIRAcetam  250 mg Oral BID  . levothyroxine  88 mcg Oral QAC breakfast  . pantoprazole  20 mg Oral Daily  . pentosan polysulfate  100 mg Oral BID  . [START ON 05/03/2017] pneumococcal 23 valent vaccine  0.5 mL Intramuscular Tomorrow-1000  . simvastatin  20 mg Oral q1800  . venlafaxine XR  75 mg Oral Q breakfast   Continuous Infusions: . cefTRIAXone (ROCEPHIN)  IV       LOS: 1 day   Time Spent in minutes   30 minutes  Malasia Torain D.O. on 05/02/2017 at 2:48 PM  Between 7am to 7pm - Pager - [phone]  After 7pm go to [email or web address] - password [email or web address]  And look for the night coverage person covering for me after hours  [facility] Office  [phone]

## 2017-05-02 NOTE — Evaluation (Signed)
Occupational Therapy Evaluation Patient Details Name: [name] MRN: [id number] DOB: 1951/08/31 Today's Date: 05/02/2017    History of Present Illness 66 y.o. female with medical history significant of interstitial cystitis, hypertension, hyperlipidemia, diabetes mellitus, GERD, hypothyroidism, depression, iron deficiency anemia, gastric ulcer, gastroparesis, COPD, CKD-4, who presents with generalized weakness, dysuria and burning on urination.   Clinical Impression   Pt reports she was managing ADL independently PTA. Currently pt overall min assist for ADL and functional mobility with the exception of mod assist for LB ADL. Pt planning to d/c home with supervision from family. Recommending HHOT for follow up to maximize independence and safety with ADL and functional mobility upon return home. Pt would benefit from continued skilled OT to address established goals.    Follow Up Recommendations  Home health OT;Supervision/Assistance - 24 hour    Equipment Recommendations  None recommended by OT    Recommendations for Other Services PT consult     Precautions / Restrictions Precautions Precautions: Fall Restrictions Weight Bearing Restrictions: No      Mobility Bed Mobility Overal bed mobility: Needs Assistance Bed Mobility: Supine to Sit     Supine to sit: Min guard     General bed mobility comments: Increased time, HOB slightly elevated.  Transfers Overall transfer level: Needs assistance Equipment used: Rolling walker (2 wheeled) Transfers: Sit to/from Stand Sit to Stand: Min assist         General transfer comment: Cues for hand placement, min assist for balance    Balance Overall balance assessment: Needs assistance Sitting-balance support: Feet supported Sitting balance-Leahy Scale: Fair     Standing balance support: No upper extremity supported;During functional activity Standing balance-Leahy Scale: Fair                              ADL either performed or assessed with clinical judgement   ADL Overall ADL's : Needs assistance/impaired Eating/Feeding: Set up;Sitting   Grooming: Minimal assistance;Standing;Wash/dry hands   Upper Body Bathing: Min guard;Sitting   Lower Body Bathing: Moderate assistance;Sit to/from stand   Upper Body Dressing : Min guard;Sitting   Lower Body Dressing: Moderate assistance;Sit to/from stand   Toilet Transfer: Minimal assistance;Ambulation;RW Toilet Transfer Details (indicate cue type and reason): Simulated by sit to stand from EOB with functional mobility in room         Functional mobility during ADLs: Minimal assistance;Rolling walker       Vision         Perception     Praxis      Pertinent Vitals/Pain Pain Assessment: Faces Faces Pain Scale: Hurts little more Pain Location: abdomen Pain Descriptors / Indicators: Burning;Aching;Sore Pain Intervention(s): Monitored during session;Limited activity within patient's tolerance;Repositioned     Hand Dominance     Extremity/Trunk Assessment Upper Extremity Assessment Upper Extremity Assessment: Generalized weakness   Lower Extremity Assessment Lower Extremity Assessment: Defer to PT evaluation       Communication Communication Communication: No difficulties   Cognition Arousal/Alertness: Awake/alert Behavior During Therapy: WFL for tasks assessed/performed Overall Cognitive Status: Within Functional Limits for tasks assessed                                     General Comments       Exercises     Shoulder Instructions      Home Living Family/patient expects to be discharged  to:: Private residence Living Arrangements: Spouse/significant other Available Help at Discharge: Family Type of Home: House Home Access: Stairs to enter Technical brewer of Steps: 4   [place]: One level     Bathroom Shower/Tub: Occupational psychologist: Handicapped height     [place]: [place] - built in;Walker - 2 wheels;Cane - single point          Prior Functioning/Environment Level of Independence: Independent with assistive device(s)        Comments: Cane for mobility, independent with ADL        OT Problem List: Decreased strength;Decreased activity tolerance;Impaired balance (sitting and/or standing);Decreased knowledge of use of DME or AE;Cardiopulmonary status limiting activity;Obesity;Pain;Increased edema      OT Treatment/Interventions: Self-care/ADL training;Therapeutic exercise;Energy conservation;DME and/or AE instruction;Therapeutic activities;Patient/family education;Balance training    OT Goals(Current goals can be found in the care plan section) Acute Rehab OT Goals Patient Stated Goal: feel better OT Goal Formulation: With patient Time For Goal Achievement: 05/16/17 Potential to Achieve Goals: Good ADL Goals Pt Will Perform Lower Body Bathing: with supervision;sit to/from stand Pt Will Perform Lower Body Dressing: with supervision;sit to/from stand Pt Will Transfer to Toilet: with supervision;ambulating;bedside commode Pt Will Perform Toileting - Clothing Manipulation and hygiene: with supervision;sit to/from stand Pt Will Perform Tub/Shower Transfer: Shower transfer;with supervision;ambulating;shower seat;rolling walker  OT Frequency: Min 2X/week   Barriers to D/C:            Co-evaluation              AM-PAC PT "6 Clicks" Daily Activity     Outcome Measure Help from another person eating meals?: None Help from another person taking care of personal grooming?: A Little Help from another person toileting, which includes using toliet, bedpan, or urinal?: A Little Help from another person bathing (including washing, rinsing, drying)?: A Lot Help from another person to put on and taking off regular upper body clothing?: A Little Help from another person to put on and taking off regular lower body clothing?: A Lot 6  Click Score: 17   End of Session Equipment Utilized During Treatment: Gait belt;Rolling walker  Activity Tolerance: Patient tolerated treatment well Patient left: in chair;with call bell/phone within reach;with family/visitor present  OT Visit Diagnosis: Unsteadiness on feet (R26.81);Muscle weakness (generalized) (M62.81);Pain Pain - part of body:  (abdomen)                Time: 9371-6967 OT Time Calculation (min): 14 min Charges:  OT General Charges $OT Visit: 1 Procedure OT Evaluation $OT Eval Moderate Complexity: 1 Procedure G-Codes:     Trasean Delima A. Ulice Brilliant, M.S., OTR/L Pager: [place] 05/02/2017, 12:19 PM

## 2017-05-03 LAB — CBC
HCT: 26.6 % — ABNORMAL LOW (ref 36.0–46.0)
Hemoglobin: 8.5 g/dL — ABNORMAL LOW (ref 12.0–15.0)
MCH: 30.6 pg (ref 26.0–34.0)
MCHC: 32 g/dL (ref 30.0–36.0)
MCV: 95.7 fL (ref 78.0–100.0)
Platelets: 183 10*3/uL (ref 150–400)
RBC: 2.78 MIL/uL — ABNORMAL LOW (ref 3.87–5.11)
RDW: 13.7 % (ref 11.5–15.5)
WBC: 7.3 10*3/uL (ref 4.0–10.5)

## 2017-05-03 LAB — BASIC METABOLIC PANEL
Anion gap: 9 (ref 5–15)
BUN: 32 mg/dL — ABNORMAL HIGH (ref 6–20)
CO2: 16 mmol/L — ABNORMAL LOW (ref 22–32)
Calcium: 8.5 mg/dL — ABNORMAL LOW (ref 8.9–10.3)
Chloride: 113 mmol/L — ABNORMAL HIGH (ref 101–111)
Creatinine, Ser: 1.96 mg/dL — ABNORMAL HIGH (ref 0.44–1.00)
GFR calc Af Amer: 29 mL/min — ABNORMAL LOW (ref >60)
GFR calc non Af Amer: 25 mL/min — ABNORMAL LOW (ref >60)
Glucose, Bld: 127 mg/dL — ABNORMAL HIGH (ref 65–99)
Potassium: 3.7 mmol/L (ref 3.5–5.1)
Sodium: 138 mmol/L (ref 135–145)

## 2017-05-03 LAB — URINE CULTURE: Culture: 40000 — AB

## 2017-05-03 LAB — GLUCOSE, CAPILLARY
Glucose-Capillary: 148 mg/dL — ABNORMAL HIGH (ref 65–99)
Glucose-Capillary: 144 mg/dL — ABNORMAL HIGH (ref 65–99)

## 2017-05-03 MED ORDER — AMOXICILLIN-POT CLAVULANATE 500-125 MG PO TABS: 1.0000 | ORAL_TABLET | Freq: Two times a day (BID) | ORAL | 0 refills | Status: DC

## 2017-05-03 MED ORDER — FLUCONAZOLE 100 MG PO TABS
150.0000 mg | ORAL_TABLET | Freq: Once | ORAL | Status: AC
  Administered 2017-05-03: 150 mg via ORAL
  Filled 2017-05-03: qty 2

## 2017-05-03 NOTE — Discharge Summary (Addendum)
Physician Discharge Summary  AYO GUARINO HRC:[id number] DOB: 15-May-1951 DOA: 05/01/2017  PCP: Biagio Borg, MD  Admit date: 05/01/2017 Discharge date: 05/03/2017  Time spent: 45 minutes  Recommendations for Outpatient Follow-up:  Patient will be discharged to home with home health physical and occupational therapy.  Patient will need to follow up with primary care provider within one week of discharge, repeat CBC and BMP.  Follow up with Dr. Matilde Sprang, urology. Hold Trimethoprim until you complete your antibiotic course. Patient should continue medications as prescribed.  Patient should follow a heart healthy/carb modified diet.   Discharge Diagnoses: [place] Urinary tract infection with lactic acidosis and hypotension Acute Renal failure on CKD, stage IV with hyperkalemia Diabetes mellitus, type II Anemia of chronic disease Depression  Essential hypertension CAD Hypothyroidism Malaise and fatigue  Discharge Condition: Stable  Diet recommendation: Heart healthy/carb modified  Filed Weights   05/01/17 1823 05/02/17 0148  Weight: 126.1 kg (278 lb) 126.2 kg (278 lb 3.5 oz)    History of present illness:  on 05/01/17 by Dr. Earnest Bailey D Milleris a 66 y.o.femalewith medical history significant of interstitial cystitis, hypertension, hyperlipidemia, diabetes mellitus, GERD, hypothyroidism, depression, iron deficiency anemia, gastric ulcer, gastroparesis, COPD, CKD-4, who presents with generalized weakness, dysuria and burning on urination. Patient states that she was recently treated by her doctor with Levaquin for UTI. She completed 7 day course of treatment yesterday. She states that she her symptoms have improved, but still has dysuria and burning on urination. She also followed up with urology due to interstitial cystitis. She states that she had a bladder "instillation" done on Tuesday by her urologist, "putting numb medication to her bladder". Patient states that she has  a generalized weakness, but no unilateral weakness,numbness or tingling to extremities. She has decreased oral intake and poor appetite. She also has decreased urine output. Patient had nausea and vomiting on Sunday, which has resolved. She also had mild lower abdominal pain earlier, which has resolved. Currently no nausea, vomiting, abdominal pain, diarrhea. She has chills, but no fever.  Hospital Course:  Urinary tract infection with lactic acidosis and hypotension -Unfortunately, patient did not meet sepsis criteria based on hypotension and elevated lactic acid. -She was given IV fluids, her blood pressure and lactic acid improved -Patient was not tachycardic, tachypneic, no leukocytosis, no fever on admission -UA showed rare bacteria, 0-5 WBC, positive nitrites -Blood cultures show no growth to date -Urine culture multiple species and 40K colonies of yeast -Patient was given dose of aztreonam as she has several medication allergies and was transitioned to Ceftriaxone -completed 7 days of levaquin prior to admission. In 2015, last urine culture was Ecoli with resistance to fluoroquinolones -Will discharge with 5 days of Augmentin 500/125mg  BID (renally adjusted)- patient was able to tolerate ceftriaxone during hospitalization. Discussed with pharmacy -Gave one dose fluconazole 150mg  prior to discharge for yeast -Follow up with urology, Dr. Matilde Sprang -Hold TMP until completion of Augmentin  -Had long conversation with husband and patient regarding personal hygiene, using panty liners or pads, and replacing them frequently. Also recommended that patient attempt keel exercises to strengthen her pelvic floor as she may have some component of stress incontinence which could be causing her frequent urinary tract infections. Discussed more timed and frequent urination, every 2-3 hours if possible recommended patient stay hydrated as her husband states she does not like to drink many fluids as to not  use the bathroom. Discussed urination after sexual intercourse.  Acute Renal failure on  CKD, stage IV with hyperkalemia -Likely secondary to the above -Hyperkalemia resolved, patient was given Kayexalate on admission -Creatinine was 5.29 upon admission, currently 1.96 -Baseline creatinine approximately 1.8-1.9 -Repeat BMP in one week -Follow up nephrology, Dr. Joelyn Oms  Diabetes mellitus, type II -Continue Lantus, insulin sliding scale CBG monitoring  Anemia of chronic disease -Hemoglobin in 2015 was approximately 11, however in 2017 was 9.9. -Continue iron supplementation -Currently hemoglobin 8.5 -Repeat CBC one week   Depression  -Continue prozac, Effexor, amitriptyline  Essential hypertension -presented with hypotension secondary to sepsis -antihypretensive meds held during hospitalization given hypotension   CAD -Continue statin and aspirin -Currently no chest pain  Hypothyroidism -Continue synthroid -TSH 22.77 (elevated), FT4 0.64 (WNL) -Given that T4 was normal, will not change synthroid dose and TSH likely not accurate given acute illness -recommend patient have repeat outpatient studies in 3-4 weeks once her illness has resolved.  Malaise and fatigue -Likely secondary to infection  -OT and PT recommended HH  Chronic pain -Use mobic sparingly given chronic kidney disease- discussed with patient -Continue home dose of hydrocodone  Procedures: None  Consultations: None  Discharge Exam: Vitals:   05/03/17 0414 05/03/17 0937  BP: 135/68 (!) 118/52  Pulse: 88 83  Resp: 16 18  Temp: 98.7 F (37.1 C) 98.6 F (37 C)   Patient states she is feeling better. She is frustrated that she continues to have urinary tract infections. Patient currently denies any chest pain, shortness of breath, abdominal pain, nausea or vomiting, diarrhea or constipation, dizziness or headache.   General: Well developed, well nourished, NAD, appears stated age  66:  NCAT, mucous membranes moist.  Cardiovascular: S1 S2 auscultated, RRR  Respiratory: Clear to auscultation bilaterally with equal chest rise, no wheezing   Abdomen: Soft, obese, nontender, nondistended, + bowel sounds  Extremities: warm dry without cyanosis clubbing or edema   Neuro: AAOx3, nonfocal  Psych: appropriate mood and affect, pleasant  Discharge Instructions Discharge Instructions    AMB Referral to [facility]    Complete by:  As directed    Patient is a 66 y.o. female with medical history significant of interstitial cystitis, hypertension, hyperlipidemia, diabetes mellitus, GERD, hypothyroidism, depression, iron deficiency anemia, gastric ulcer, gastroparesis, COPD, CKD-4, who was admitted for generalized weakness, dysuria and burning on urination.  Patient verbalized need for medication assistance and management particularly insulins (Victoza, Lantus and Novolog), Elmiron, Clonidine and IUD.   Reason for consult:  Referral to [facility] for follow-up after discharge related to issues of medication affordability and management   Diagnoses of:  Diabetes   Expected date of contact:  1-3 days (reserved for hospital discharges)   Discharge instructions    Complete by:  As directed    Patient will be discharged to home with home health physical and occupational therapy.  Patient will need to follow up with primary care provider within one week of discharge, repeat CBC and BMP.  Follow up with Dr. Matilde Sprang, urology. Hold Trimethoprim until you complete your antibiotic course. Patient should continue medications as prescribed.  Patient should follow a heart healthy/carb modified diet.     Current Discharge Medication List    START taking these medications   Details  amoxicillin-clavulanate (AUGMENTIN) 500-125 MG tablet Take 1 tablet (500 mg total) by mouth 2 (two) times daily. Qty: 10 tablet, Refills: 0      CONTINUE these medications which have NOT CHANGED    Details  amitriptyline (ELAVIL) 10 MG tablet Take 10 mg by  mouth at bedtime.     amLODipine-valsartan (EXFORGE) 10-320 MG tablet Take 1 tablet by mouth daily. Qty: 90 tablet, Refills: 1    aspirin 81 MG EC tablet Take 81 mg by mouth every evening.     cloNIDine (CATAPRES) 0.1 MG tablet TAKE 1 TABLET (0.1 MG TOTAL) BY MOUTH 2 (TWO) TIMES DAILY. Qty: 30 tablet, Refills: 0    ELMIRON 100 MG capsule Take 100 mg by mouth 2 (two) times daily.     Estradiol Acetate 0.1 MG/24HR RING Place 1 each vaginally every 3 (three) months. Qty: 1 each, Refills: 4    ferrous sulfate 325 (65 FE) MG tablet Take 325 mg by mouth 2 (two) times daily.     FLUoxetine (PROZAC) 20 MG capsule TAKE ONE CAPSULE BY MOUTH DAILY Qty: 90 capsule, Refills: 3    glimepiride (AMARYL) 1 MG tablet TAKE 1 TABLET BY MOUTH WITH SUPPER ONCE A DAY PER DR. Dwyane Dee Qty: 30 tablet, Refills: 2    HYDROcodone-acetaminophen (NORCO/VICODIN) 5-325 MG tablet Take 1 tablet by mouth every 6 (six) hours as needed for moderate pain or severe pain.     insulin aspart (NOVOLOG FLEXPEN) 100 UNIT/ML FlexPen Inject 10 Units into the skin 3 (three) times daily with meals.    insulin glargine (LANTUS) 100 UNIT/ML injection Inject 25 Units into the skin at bedtime.    isosorbide dinitrate (ISORDIL) 10 MG tablet TAKE ONE TABLET BY MOUTH DAILY Qty: 90 tablet, Refills: 1    lansoprazole (PREVACID) 15 MG capsule Take 15 mg by mouth every evening.     levETIRAcetam (KEPPRA) 250 MG tablet Take 250 mg by mouth 2 (two) times daily.    levothyroxine (SYNTHROID, LEVOTHROID) 88 MCG tablet TAKE ONE TABLET BY MOUTH DAILY Qty: 15 tablet, Refills: 0    linaclotide (LINZESS) 72 MCG capsule Take 1 capsule (72 mcg total) by mouth daily before breakfast. Qty: 30 capsule, Refills: 11    meloxicam (MOBIC) 15 MG tablet Take 15 mg by mouth every evening.     ondansetron (ZOFRAN) 4 MG tablet TAKE 1 TABLET BY MOUTH EVERY 6 HOURS AS NEEDED FOR NAUSEA Qty: 30  tablet, Refills: 0    simvastatin (ZOCOR) 20 MG tablet TAKE ONE TABLET BY MOUTH DAILY. OVERDUE FOR YEARLY PHYSICAL WITH LABS MUST SEE M.D. FOR REFILLS Qty: 30 tablet, Refills: 5    trimethoprim (TRIMPEX) 100 MG tablet Take 100 mg by mouth every evening.  Refills: 1    venlafaxine XR (EFFEXOR-XR) 37.5 MG 24 hr capsule Take 2 capsules (75 mg total) by mouth daily with breakfast. Qty: 60 capsule, Refills: 1    VICTOZA 18 MG/3ML SOPN Inject 0.2 mLs (1.2 mg total) into the skin daily. Inject once daily at the same time Qty: 2 pen, Refills: 3    glucose blood (ACCU-CHEK AVIVA PLUS) test strip Use to test blood sugar 3 times daily- Dx code E11.9 Qty: 100 each, Refills: 12      STOP taking these medications     estradiol (ESTRACE) 2 MG tablet      levofloxacin (LEVAQUIN) 500 MG tablet      metFORMIN (GLUCOPHAGE-XR) 500 MG 24 hr tablet      phenazopyridine (PYRIDIUM) 100 MG tablet        Allergies  Allergen Reactions  . Hydrocodone Other (See Comments)    Tachycardia   . Ciprofloxacin Other (See Comments)    Dizziness   . Penicillins Hives    Has patient had a PCN reaction causing immediate rash,  facial/tongue/throat swelling, SOB or lightheadedness with hypotension: Yes Has patient had a PCN reaction causing severe rash involving mucus membranes or skin necrosis: No Has patient had a PCN reaction that required hospitalization No Has patient had a PCN reaction occurring within the last 10 years: Yes If all of the above answers are "NO", then may proceed with Cephalosporin use.    Follow-up Information    Biagio Borg, MD. Schedule an appointment as soon as possible for a visit in 1 week(s).   Specialties:  Internal Medicine, Radiology Why:  Hospital follow up, repeat CBC  Contact information: [place] [address] [phone]        Rexene Agent, MD. Schedule an appointment as soon as possible for a visit in 2 week(s).   Specialty:   Nephrology Why:  Repeat BMP Contact information: [place] [address] [phone]        Bjorn Loser, MD. Schedule an appointment as soon as possible for a visit in 1 week(s).   Specialty:  Urology Why:  Frequent UTI Contact information: [address] [place] [address] [phone]            The results of significant diagnostics from this hospitalization (including imaging, microbiology, ancillary and laboratory) are listed below for reference.    Significant Diagnostic Studies: Dg Chest 2 View  Result Date: 05/01/2017 CLINICAL DATA:  Sepsis.  Feeling sick for 2 weeks. EXAM: CHEST  2 VIEW COMPARISON:  March 28, 2016 FINDINGS: The heart size and mediastinal contours are within normal limits. There is no focal infiltrate, pulmonary edema, or pleural effusion. The visualized skeletal structures are unremarkable. IMPRESSION: No active cardiopulmonary disease. Electronically Signed   By: Abelardo Diesel M.D.   On: 05/01/2017 18:12   Ct Renal Stone Study  Result Date: 05/01/2017 CLINICAL DATA:  Right lower quadrant pain and renal failure, nausea vomiting and diarrhea EXAM: CT ABDOMEN AND PELVIS WITHOUT CONTRAST TECHNIQUE: Multidetector CT imaging of the abdomen and pelvis was performed following the standard protocol without IV contrast. COMPARISON:  02/26/2017, 01/26/2017 FINDINGS: Lower chest: Lung bases demonstrate no acute consolidation or pleural effusion. Heart size nonenlarged. Hepatobiliary: No focal liver abnormality is seen. Status post cholecystectomy. No biliary dilatation. Pancreas: Unremarkable. No pancreatic ductal dilatation or surrounding inflammatory changes. Spleen: Normal in size without focal abnormality. Adrenals/Urinary Tract: Adrenal glands are within normal limits. No hydronephrosis or hydroureter. Punctate density in the right low pelvis is questionable for a tiny stone in the ureter. Bladder otherwise normal Stomach/Bowel:  Postsurgical changes of the stomach. Appendix appears normal. No evidence of bowel wall thickening, distention, or inflammatory changes. Vascular/Lymphatic: Aortic atherosclerosis. No enlarged abdominal or pelvic lymph nodes. Reproductive: Circular ring like structure in the vagina. No adnexal masses. Status post hysterectomy. Other: No free air or significant free fluid Musculoskeletal: Postsurgical changes at L4-L5. IMPRESSION: 1. Punctate density in the low right pelvis is questionable for a tiny stone in the distal right ureter about a cm proximal to the UVJ. No hydronephrosis. No hydroureter. 2. Negative appendix Electronically Signed   By: Donavan Foil M.D.   On: 05/01/2017 21:59    Microbiology: Recent Results (from the past 240 hour(s))  Culture, blood (Routine x 2)     Status: None (Preliminary result)   Collection Time: 05/01/17  5:02 PM  Result Value Ref Range Status   Specimen Description BLOOD LEFT ANTECUBITAL  Final   Special Requests   Final  BOTTLES DRAWN AEROBIC AND ANAEROBIC Blood Culture adequate volume   Culture NO GROWTH < 24 HOURS  Final   Report Status PENDING  Incomplete  Culture, blood (Routine x 2)     Status: None (Preliminary result)   Collection Time: 05/01/17  6:43 PM  Result Value Ref Range Status   Specimen Description BLOOD LEFT HAND  Final   Special Requests   Final    BOTTLES DRAWN AEROBIC AND ANAEROBIC Blood Culture results may not be optimal due to an inadequate volume of blood received in culture bottles   Culture NO GROWTH < 24 HOURS  Final   Report Status PENDING  Incomplete  Urine Culture     Status: Abnormal   Collection Time: 05/01/17 11:00 PM  Result Value Ref Range Status   Specimen Description URINE, CATHETERIZED  Final   Special Requests NONE  Final   Culture 40,000 COLONIES/mL YEAST (A)  Final   Report Status 05/03/2017 FINAL  Final  Culture, Urine     Status: Abnormal   Collection Time: 05/02/17  3:44 AM  Result Value Ref Range Status     Specimen Description URINE, CLEAN CATCH  Final   Special Requests Immunocompromised  Final   Culture MULTIPLE SPECIES PRESENT, SUGGEST RECOLLECTION (A)  Final   Report Status 05/03/2017 FINAL  Final     Labs: Basic Metabolic Panel:  Recent Labs Lab 05/01/17 1718 05/02/17 0212 05/03/17 0359  NA 130* 138 138  K 5.4* 4.3 3.7  CL 103 111 113*  CO2 16* 19* 16*  GLUCOSE 422* 139* 127*  BUN 56* 46* 32*  CREATININE 5.29* 3.86* 1.96*  CALCIUM 9.3 8.8* 8.5*   Liver Function Tests:  Recent Labs Lab 05/01/17 1718  AST 11*  ALT 9*  ALKPHOS 77  BILITOT 1.2  PROT 6.6  ALBUMIN 3.5   No results for input(s): LIPASE, AMYLASE in the last 168 hours. No results for input(s): AMMONIA in the last 168 hours. CBC:  Recent Labs Lab 05/01/17 1718 05/02/17 0212 05/03/17 0359  WBC 7.9 8.7 7.3  NEUTROABS 5.3  --   --   HGB 9.0* 8.9* 8.5*  HCT 28.8* 27.6* 26.6*  MCV 95.0 94.5 95.7  PLT 228 202 183   Cardiac Enzymes: No results for input(s): CKTOTAL, CKMB, CKMBINDEX, TROPONINI in the last 168 hours. BNP: BNP (last 3 results) No results for input(s): BNP in the last 8760 hours.  ProBNP (last 3 results) No results for input(s): PROBNP in the last 8760 hours.  CBG:  Recent Labs Lab 05/02/17 0731 05/02/17 1211 05/02/17 1718 05/02/17 2030 05/03/17 0732  GLUCAP 121* 210* 275* 289* 148*       Signed:  Azelyn Batie  [facility] 05/03/2017, 11:50 AM

## 2017-05-03 NOTE — Discharge Instructions (Signed)
Acute Kidney Injury, Adult Acute kidney injury is a sudden worsening of kidney function. The kidneys are organs that have several jobs. They filter the blood to remove waste products and extra fluid. They also maintain a healthy balance of minerals and hormones in the body, which helps control blood pressure and keep bones strong. With this condition, your kidneys do not do their jobs as well as they should. This condition ranges from mild to severe. Over time it may develop into long-lasting (chronic) kidney disease. Early detection and treatment may prevent acute kidney injury from developing into a chronic condition. What are the causes? Common causes of this condition include:  A problem with blood flow to the kidneys. This may be caused by: ? Low blood pressure (hypotension) or shock. ? Blood loss. ? Heart and blood vessel (cardiovascular) disease. ? Severe burns. ? Liver disease.  Direct damage to the kidneys. This may be caused by: ? Certain medicines. ? A kidney infection. ? Poisoning. ? Being around or in contact with toxic substances. ? A surgical wound. ? A hard, direct hit to the kidney area.  A sudden blockage of urine flow. This may be caused by: ? Cancer. ? Kidney stones. ? An enlarged prostate in males.  What are the signs or symptoms? Symptoms of this condition may not be obvious until the condition becomes severe. Symptoms of this condition can include:  Tiredness (lethargy), or difficulty staying awake.  Nausea or vomiting.  Swelling (edema) of the face, legs, ankles, or feet.  Problems with urination, such as: ? Abdominal pain, or pain along the side of your stomach (flank). ? Decreased urine production. ? Decrease in the force of urine flow.  Muscle twitches and cramps, especially in the legs.  Confusion or trouble concentrating.  Loss of appetite.  Fever.  How is this diagnosed? This condition may be diagnosed with tests, including:  Blood  tests.  Urine tests.  Imaging tests.  A test in which a sample of tissue is removed from the kidneys to be examined under a microscope (kidney biopsy).  How is this treated? Treatment for this condition depends on the cause and how severe the condition is. In mild cases, treatment may not be needed. The kidneys may heal on their own. In more severe cases, treatment will involve:  Treating the cause of the kidney injury. This may involve changing any medicines you are taking or adjusting your dosage.  Fluids. You may need specialized IV fluids to balance your body's needs.  Having a catheter placed to drain urine and prevent blockages.  Preventing problems from occurring. This may mean avoiding certain medicines or procedures that can cause further injury to the kidneys.  In some cases treatment may also require:  A procedure to remove toxic wastes from the body (dialysis or continuous renal replacement therapy - CRRT).  Surgery. This may be done to repair a torn kidney, or to remove the blockage from the urinary system.  Follow these instructions at home: Medicines  Take over-the-counter and prescription medicines only as told by your health care provider.  Do not take any new medicines without your health care provider's approval. Many medicines can worsen your kidney damage.  Do not take any vitamin and mineral supplements without your health care provider's approval. Many nutritional supplements can worsen your kidney damage. Lifestyle  If your health care provider prescribed changes to your diet, follow them. You may need to decrease the amount of protein you eat.  Achieve  and maintain a healthy weight. If you need help with this, ask your health care provider.  Start or continue an exercise plan. Try to exercise at least 30 minutes a day, 5 days a week.  Do not use any tobacco products, such as cigarettes, chewing tobacco, and e-cigarettes. If you need help quitting, ask  your health care provider. General instructions  Keep track of your blood pressure. Report changes in your blood pressure as told by your health care provider.  Stay up to date with immunizations. Ask your health care provider which immunizations you need.  Keep all follow-up visits as told by your health care provider. This is important. Where to find more information:  American Association of Kidney Patients: BombTimer.gl  National Kidney Foundation: www.kidney.[place]: https://mathis.com/  Life Options Rehabilitation [facility]: ? www.lifeoptions.org ? www.kidneyschool.org Contact a health care provider if:  Your symptoms get worse.  You develop new symptoms. Get help right away if:  You develop symptoms of worsening kidney disease, which include: ? Headaches. ? Abnormally dark or light skin. ? Easy bruising. ? Frequent hiccups. ? Chest pain. ? Shortness of breath. ? End of menstruation in women. ? Seizures. ? Confusion or altered mental status. ? Abdominal or back pain. ? Itchiness.  You have a fever.  Your body is producing less urine.  You have pain or bleeding when you urinate. Summary  Acute kidney injury is a sudden worsening of kidney function.  Acute kidney injury can be caused by problems with blood flow to the kidneys, direct damage to the kidneys, and sudden blockage of urine flow.  Symptoms of this condition may not be obvious until it becomes severe. Symptoms may include edema, lethargy, confusion, nausea or vomiting, and problems passing urine.  This condition can usually be diagnosed with blood tests, urine tests, and imaging tests. Sometimes a kidney biopsy is done to diagnose this condition.  Treatment for this condition often involves treating the underlying cause. It is treated with fluids, medicines, dialysis, diet changes, or surgery. This information is not intended to replace advice given to you by your health care provider.  Make sure you discuss any questions you have with your health care provider. Document Released: 04/08/2011 Document Revised: 09/13/2016 Document Reviewed: 09/13/2016 Elsevier Interactive Patient Education  2017 [place].  Urinary Tract Infection, Adult A urinary tract infection (UTI) is an infection of any part of the urinary tract, which includes the kidneys, ureters, bladder, and urethra. These organs make, store, and get rid of urine in the body. UTI can be a bladder infection (cystitis) or kidney infection (pyelonephritis). What are the causes? This infection may be caused by fungi, viruses, or bacteria. Bacteria are the most common cause of UTIs. This condition can also be caused by repeated incomplete emptying of the bladder during urination. What increases the risk? This condition is more likely to develop if: You ignore your need to urinate or hold urine for long periods of time. You do not empty your bladder completely during urination. You wipe back to front after urinating or having a bowel movement, if you are female. You are uncircumcised, if you are female. You are constipated. You have a urinary catheter that stays in place (indwelling). You have a weak defense (immune) system. You have a medical condition that affects your bowels, kidneys, or bladder. You have diabetes. You take antibiotic medicines frequently or for long periods of time, and the antibiotics no longer work well against certain types of  infections (antibiotic resistance). You take medicines that irritate your urinary tract. You are exposed to chemicals that irritate your urinary tract. You are female.  What are the signs or symptoms? Symptoms of this condition include: Fever. Frequent urination or passing small amounts of urine frequently. Needing to urinate urgently. Pain or burning with urination. Urine that smells bad or unusual. Cloudy urine. Pain in the lower abdomen or back. Trouble  urinating. Blood in the urine. Vomiting or being less hungry than normal. Diarrhea or abdominal pain. Vaginal discharge, if you are female.  How is this diagnosed? This condition is diagnosed with a medical history and physical exam. You will also need to provide a urine sample to test your urine. Other tests may be done, including: Blood tests. Sexually transmitted disease (STD) testing.  If you have had more than one UTI, a cystoscopy or imaging studies may be done to determine the cause of the infections. How is this treated? Treatment for this condition often includes a combination of two or more of the following: Antibiotic medicine. Other medicines to treat less common causes of UTI. Over-the-counter medicines to treat pain. Drinking enough water to stay hydrated.  Follow these instructions at home: Take over-the-counter and prescription medicines only as told by your health care provider. If you were prescribed an antibiotic, take it as told by your health care provider. Do not stop taking the antibiotic even if you start to feel better. Avoid alcohol, caffeine, tea, and carbonated beverages. They can irritate your bladder. Drink enough fluid to keep your urine clear or pale yellow. Keep all follow-up visits as told by your health care provider. This is important. Make sure to: Empty your bladder often and completely. Do not hold urine for long periods of time. Empty your bladder before and after sex. Wipe from front to back after a bowel movement if you are female. Use each tissue one time when you wipe. Contact a health care provider if: You have back pain. You have a fever. You feel nauseous or vomit. Your symptoms do not get better after 3 days. Your symptoms go away and then return. Get help right away if: You have severe back pain or lower abdominal pain. You are vomiting and cannot keep down any medicines or water. This information is not intended to replace advice  given to you by your health care provider. Make sure you discuss any questions you have with your health care provider. Document Released: 07/03/2005 Document Revised: 03/06/2016 Document Reviewed: 08/14/2015 Elsevier Interactive Patient Education  2017 [employer].

## 2017-05-03 NOTE — Care Management Note (Signed)
Case Management Note  Patient Details  Name: [name] MRN: [id number] Date of Birth: 1951/02/12  Subjective/Objective:  Received CM consult for HHPT. Pt has chosen [facility] and Brad, rep for that agency is aware.                   Action/Plan:CM will sign off for now but will be available should additional discharge needs arise or disposition change.    Expected Discharge Date:  05/03/17               Expected Discharge Plan:     In-House Referral:     Discharge planning Services  CM Consult  Post Acute Care Choice:  Home Health Choice offered to:  Patient  DME Arranged:  N/A DME Agency:     HH Arranged:  PT [place] Agency:  [place]  Status of Service:  Completed, signed off  If discussed at [place] of Stay Meetings, dates discussed:    Additional Comments:  Delrae Sawyers, RN 05/03/2017, 1:14 PM

## 2017-05-03 NOTE — Progress Notes (Signed)
[name] to be D/C'd Home per MD order.  Discussed prescriptions and follow up appointments with the patient. Prescriptions given to patient, medication list explained in detail. Pt verbalized understanding.  Allergies as of 05/03/2017      Reactions   Hydrocodone Other (See Comments)   Tachycardia   Ciprofloxacin Other (See Comments)   Dizziness   Penicillins Hives   Has patient had a PCN reaction causing immediate rash, facial/tongue/throat swelling, SOB or lightheadedness with hypotension: Yes Has patient had a PCN reaction causing severe rash involving mucus membranes or skin necrosis: No Has patient had a PCN reaction that required hospitalization No Has patient had a PCN reaction occurring within the last 10 years: Yes If all of the above answers are "NO", then may proceed with Cephalosporin use.      Medication List    STOP taking these medications   estradiol 2 MG tablet Commonly known as:  ESTRACE   levofloxacin 500 MG tablet Commonly known as:  LEVAQUIN   metFORMIN 500 MG 24 hr tablet Commonly known as:  GLUCOPHAGE-XR   phenazopyridine 100 MG tablet Commonly known as:  PYRIDIUM     TAKE these medications   amitriptyline 10 MG tablet Commonly known as:  ELAVIL Take 10 mg by mouth at bedtime.   amLODipine-valsartan 10-320 MG tablet Commonly known as:  EXFORGE Take 1 tablet by mouth daily. What changed:  when to take this   amoxicillin-clavulanate 500-125 MG tablet Commonly known as:  AUGMENTIN Take 1 tablet (500 mg total) by mouth 2 (two) times daily.   aspirin 81 MG EC tablet Take 81 mg by mouth every evening.   cloNIDine 0.1 MG tablet Commonly known as:  CATAPRES TAKE 1 TABLET (0.1 MG TOTAL) BY MOUTH 2 (TWO) TIMES DAILY.   ELMIRON 100 MG capsule Generic drug:  pentosan polysulfate Take 100 mg by mouth 2 (two) times daily.   Estradiol Acetate 0.1 MG/24HR Ring Place 1 each vaginally every 3 (three) months.   ferrous sulfate 325 (65 FE) MG  tablet Take 325 mg by mouth 2 (two) times daily.   FLUoxetine 20 MG capsule Commonly known as:  PROZAC TAKE ONE CAPSULE BY MOUTH DAILY What changed:  See the new instructions.   glimepiride 1 MG tablet Commonly known as:  AMARYL TAKE 1 TABLET BY MOUTH WITH SUPPER ONCE A DAY PER DR. Dwyane Dyer What changed:  See the new instructions.   glucose blood test strip Commonly known as:  ACCU-CHEK AVIVA PLUS Use to test blood sugar 3 times daily- Dx code E11.9   HYDROcodone-acetaminophen 5-325 MG tablet Commonly known as:  NORCO/VICODIN Take 1 tablet by mouth every 6 (six) hours as needed for moderate pain or severe pain.   insulin glargine 100 UNIT/ML injection Commonly known as:  LANTUS Inject 25 Units into the skin at bedtime.   isosorbide dinitrate 10 MG tablet Commonly known as:  ISORDIL TAKE ONE TABLET BY MOUTH DAILY What changed:  See the new instructions.   lansoprazole 15 MG capsule Commonly known as:  PREVACID Take 15 mg by mouth every evening.   levETIRAcetam 250 MG tablet Commonly known as:  KEPPRA Take 250 mg by mouth 2 (two) times daily.   levothyroxine 88 MCG tablet Commonly known as:  SYNTHROID, LEVOTHROID TAKE ONE TABLET BY MOUTH DAILY What changed:  See the new instructions.   linaclotide 72 MCG capsule Commonly known as:  LINZESS Take 1 capsule (72 mcg total) by mouth daily before breakfast. What changed:  when to take this  reasons to take this   meloxicam 15 MG tablet Commonly known as:  MOBIC Take 15 mg by mouth every evening.   NOVOLOG FLEXPEN 100 UNIT/ML FlexPen Generic drug:  insulin aspart Inject 10 Units into the skin 3 (three) times daily with meals.   ondansetron 4 MG tablet Commonly known as:  ZOFRAN TAKE 1 TABLET BY MOUTH EVERY 6 HOURS AS NEEDED FOR NAUSEA What changed:  See the new instructions.   simvastatin 20 MG tablet Commonly known as:  ZOCOR TAKE ONE TABLET BY MOUTH DAILY. OVERDUE FOR YEARLY PHYSICAL WITH LABS MUST SEE M.D.  FOR REFILLS What changed:  See the new instructions.   trimethoprim 100 MG tablet Commonly known as:  TRIMPEX Take 100 mg by mouth every evening.   venlafaxine XR 37.5 MG 24 hr capsule Commonly known as:  EFFEXOR-XR Take 2 capsules (75 mg total) by mouth daily with breakfast.   VICTOZA 18 MG/3ML Sopn Generic drug:  liraglutide Inject 0.2 mLs (1.2 mg total) into the skin daily. Inject once daily at the same time What changed:  when to take this  additional instructions       Vitals:   05/03/17 0414 05/03/17 0937  BP: 135/68 (!) 118/52  Pulse: 88 83  Resp: 16 18  Temp: 98.7 F (37.1 C) 98.6 F (37 C)    Skin clean, dry and intact without evidence of skin break down, no evidence of skin tears noted. IV catheter discontinued intact. Site without signs and symptoms of complications. Dressing and pressure applied. Pt denies pain at this time. No complaints noted.  An After Visit Summary was printed and given to the patient. Patient escorted via [place], and D/C home via private auto.  Haywood Lasso BSN, RN [facility] Phone [phone]

## 2017-05-05 ENCOUNTER — Telehealth: Payer: Self-pay | Admitting: *Deleted

## 2017-05-05 ENCOUNTER — Other Ambulatory Visit: Payer: Self-pay | Admitting: Internal Medicine

## 2017-05-05 ENCOUNTER — Other Ambulatory Visit: Payer: Self-pay | Admitting: *Deleted

## 2017-05-05 NOTE — Telephone Encounter (Signed)
Called pt to set-up TCM Hosp f/u appt no answer LMOM RTC...[name]

## 2017-05-05 NOTE — Patient Outreach (Signed)
[facility]) Care Management  05/05/2017  [name] [birth year] [id number]  Transition of Care Referral  Referral Date: 05/02/17 Referral Source: Inpatient Date of Discharge: 05/03/17 Facility: [facility] Discharge Diagnosis: UTI with lactic acidosis and hypotension Insurance: Medicare/BCBS  Outreach attempt #1 to patient. No answer. RN CM left HIPAA compliant message along with contact info.  Outreach attempt # 2 to patient's cellular number. Patient unavailable, requested a return phone call at a later time.  Plan: RN CM will contact patient within 1 business day.   Lake Bells, RN, BSN, MHA/MSL, [place] Telephonic Care Manager Coordinator [facility] Direct Phone: [phone] Toll Free: [phone] Fax: [phone]

## 2017-05-06 ENCOUNTER — Other Ambulatory Visit: Payer: Self-pay | Admitting: *Deleted

## 2017-05-06 ENCOUNTER — Ambulatory Visit: Payer: Self-pay | Admitting: *Deleted

## 2017-05-06 LAB — CULTURE, BLOOD (ROUTINE X 2)
Culture: NO GROWTH
Culture: NO GROWTH
Special Requests: ADEQUATE

## 2017-05-06 NOTE — Patient Outreach (Addendum)
[facility]) Care Management  05/06/2017  [name] April 21, 1951 [id number]    Transition of Care Referral  Referral Date: 05/02/17 Referral Source: Inpatient Date of Discharge: 05/03/17 Facility: [facility] Discharge Diagnosis: UTI with lactic acidosis and hypotension Insurance: Medicare/BCBS  Outreach attempt #2 to patient. No answer. RN CM left HIPAA compliant message along with contact info. Outreach attempt # 2 to patient's cellular number. Patient unavailable, requested a return phone call at a later time. Patient stated, her husband is about to go into surgery. "She can't talk at this time".  Plan: RN CM will contact patient within 6 business day.   Lake Bells, RN, BSN, MHA/MSL, [place] Telephonic Care Manager Coordinator [facility] Direct Phone: [phone] Toll Free: [phone] Fax: [phone]

## 2017-05-07 NOTE — Telephone Encounter (Signed)
Called pt again x's 2 still no answer LMOM (home & cell) to call [facility] to make hosp f/u appt w/dr. [name]...[name]

## 2017-05-07 NOTE — Telephone Encounter (Signed)
Called pt again still no answer closing phone encounter have left pt 3 msg still haven't call [facility] to make appt...[name]

## 2017-05-08 ENCOUNTER — Other Ambulatory Visit: Payer: Self-pay | Admitting: Pharmacist

## 2017-05-08 NOTE — Patient Outreach (Signed)
[facility]) Care Management  [place]   05/08/2017  [name] 1950-12-04 [id number]  Late entry for 05/08/17.    Subjective:  Patient was referred to [facility] Pharmacist by [facility] RN Edwena Felty for medication patient assistance concerns.    Successful phone outreach to patient, HIPAA details verified.    Patient reports she has [employer] Part D insurance and is concerned with cost of Elmiron, Victoza, insulin.    Her past medical history is significant for: hypertension, hypothyroidism, GERD, chronic kidney disease, diabetes mellitus.    Patient reports she would be able to review medications over the phone.    Objective:   Current Medications: Current Outpatient Prescriptions  Medication Sig Dispense Refill  . amitriptyline (ELAVIL) 10 MG tablet Take 10 mg by mouth at bedtime.     Marland Kitchen amLODipine-valsartan (EXFORGE) 10-320 MG tablet Take 1 tablet by mouth daily. (Patient taking differently: Take 1 tablet by mouth at bedtime. ) 90 tablet 1  . amLODipine-valsartan (EXFORGE) 10-320 MG tablet Take 1 tablet by mouth daily. Annual appt due in Sept must see provider for future refills 90 tablet 0  . amoxicillin-clavulanate (AUGMENTIN) 500-125 MG tablet Take 1 tablet (500 mg total) by mouth 2 (two) times daily. 10 tablet 0  . aspirin 81 MG EC tablet Take 81 mg by mouth every evening.     . cloNIDine (CATAPRES) 0.1 MG tablet TAKE 1 TABLET (0.1 MG TOTAL) BY MOUTH 2 (TWO) TIMES DAILY. 30 tablet 0  . ELMIRON 100 MG capsule Take 100 mg by mouth 2 (two) times daily.     . Estradiol Acetate 0.1 MG/24HR RING Place 1 each vaginally every 3 (three) months. 1 each 4  . ferrous sulfate 325 (65 FE) MG tablet Take 325 mg by mouth 2 (two) times daily.     Marland Kitchen FLUoxetine (PROZAC) 20 MG capsule TAKE ONE CAPSULE BY MOUTH DAILY (Patient taking differently: Take 20 mg by mouth at bedtime) 90 capsule 3  . glimepiride (AMARYL) 1 MG tablet TAKE 1 TABLET BY MOUTH WITH SUPPER ONCE A DAY PER DR.  Dwyane Dee (Patient taking differently: Take 1 mg by mouth with supper if BGL is 250 or greater) 30 tablet 2  . glucose blood (ACCU-CHEK AVIVA PLUS) test strip Use to test blood sugar 3 times daily- Dx code E11.9 100 each 12  . HYDROcodone-acetaminophen (NORCO/VICODIN) 5-325 MG tablet Take 1 tablet by mouth every 6 (six) hours as needed for moderate pain or severe pain.     Marland Kitchen insulin aspart (NOVOLOG FLEXPEN) 100 UNIT/ML FlexPen Inject 10 Units into the skin 3 (three) times daily with meals.    . insulin glargine (LANTUS) 100 UNIT/ML injection Inject 25 Units into the skin at bedtime.    . isosorbide dinitrate (ISORDIL) 10 MG tablet TAKE ONE TABLET BY MOUTH DAILY (Patient taking differently: Take 10 mg by mouth once a day) 90 tablet 1  . lansoprazole (PREVACID) 15 MG capsule Take 15 mg by mouth every evening.     . levETIRAcetam (KEPPRA) 250 MG tablet Take 250 mg by mouth 2 (two) times daily.    Marland Kitchen levothyroxine (SYNTHROID, LEVOTHROID) 88 MCG tablet TAKE ONE TABLET BY MOUTH DAILY (Patient taking differently: Take 88 mcg by mouth in the morning) 15 tablet 0  . linaclotide (LINZESS) 72 MCG capsule Take 1 capsule (72 mcg total) by mouth daily before breakfast. (Patient taking differently: Take 72 mcg by mouth daily as needed (for constipation). ) 30 capsule 11  . meloxicam (  MOBIC) 15 MG tablet Take 15 mg by mouth every evening.     . ondansetron (ZOFRAN) 4 MG tablet TAKE 1 TABLET BY MOUTH EVERY 6 HOURS AS NEEDED FOR NAUSEA (Patient taking differently: Take 4 mg by mouth every six hours as needed for nausea) 30 tablet 0  . simvastatin (ZOCOR) 20 MG tablet TAKE ONE TABLET BY MOUTH DAILY. OVERDUE FOR YEARLY PHYSICAL WITH LABS MUST SEE M.D. FOR REFILLS (Patient taking differently: Take 20 mg by mouth in the evening) 30 tablet 5  . trimethoprim (TRIMPEX) 100 MG tablet Take 100 mg by mouth every evening.   1  . venlafaxine XR (EFFEXOR-XR) 37.5 MG 24 hr capsule Take 2 capsules (75 mg total) by mouth daily with  breakfast. 60 capsule 1  . VICTOZA 18 MG/3ML SOPN Inject 0.2 mLs (1.2 mg total) into the skin daily. Inject once daily at the same time (Patient taking differently: Inject 1.2 mg into the skin at bedtime. ) 2 pen 3   No current facility-administered medications for this visit.     Functional Status: In your present state of health, do you have any difficulty performing the following activities: 05/02/2017  Hearing? N  Vision? N  Difficulty concentrating or making decisions? N  Walking or climbing stairs? Y  Dressing or bathing? Y  Doing errands, shopping? N  Some recent data might be hidden    Fall/Depression Screening: Fall Risk  12/27/2016 11/23/2015  Falls in the past year? Yes No   PHQ 2/9 Scores 12/27/2016 11/23/2015  PHQ - 2 Score 0 0    Assessment:  Medication review per patient report and review of medication list in chart.   Drugs sorted by system:  Neurologic/Psychologic: -amitriptyline -fluoxetine  -levetiracetam  -venlafaxine XR   Cardiovascular: -amlodipine/valsartan -aspirin 81 mg  -clonidine  -isosorbide dintrate  -simvastatin    Gastrointestinal: -lansoprazole  -linaclotide (Linzess)  -ondansetron prn   Endocrine: -glimepiride  -insulin aspart (Novolog)  -insulin glargine (Lantus)  -levothyroxine  -liraglutide (Victoza)   Pain: -hydrocodone/acetaminophen  -meloxicam  -gabapentin---per patient report   Vitamins/Minerals: -ferrous sulfate  Infectious Diseases: -Augmentin x 5 days on hospital discharge 05/02/17 -trimethoprim   Miscellaneous: -estradiol vaginal ring -Elmiron   Duplications in therapy: -Patient on SSRI (Fluoxetine) and SNRI (venlafaxine)     Medications to avoid in the elderly:  -amitriptyline---increased risk of falls/sedation in elderly  Drug interactions:  -Patient on multiple serotonergic agents including amitriptyline, venlafaxine, fluoxetine.    Other issues noted:   1) Recommend close monitoring of renal  function and consider avoiding meloxicam given renal dysfunction.    2) Will message Dr Binnie Rail patient she is taking glimepiride---appears Dr Dwyane Dee had discontinued per 12/2016 note.    Medication assistance:   -[employer] Extra Help---spouse reports their income exceeds requirements  -Discussed [employer] patient assistance requirements including income and out-of-pocket spend.  Patient is interested in applying.   -Linzess---discussed [employer] patient assistance program---she is aware manufacturer patient assistance program evaluates Medicare beneficiaries on a case-by-case basis  -Elmiron---patient aware of need to spend at least 4% of household income on prescriptions for Wynetta Emery and [facility] Patient Assistance Foundation---she doesn't feel she met this.    Plan:  Will route note to PCP.   Will mail patient applications for manufacturer patient assistance for [employer] and [employer].    Patient denies other pharmacy related needs besides medication patient assistance.   Karrie Meres, PharmD, [place] [phone]

## 2017-05-09 ENCOUNTER — Encounter: Payer: Self-pay | Admitting: Pharmacist

## 2017-05-11 ENCOUNTER — Other Ambulatory Visit: Payer: Self-pay | Admitting: Endocrinology

## 2017-05-12 ENCOUNTER — Encounter: Payer: Self-pay | Admitting: Pharmacist

## 2017-05-12 DIAGNOSIS — N3020 Other chronic cystitis without hematuria: Secondary | ICD-10-CM | POA: Diagnosis not present

## 2017-05-12 DIAGNOSIS — N3010 Interstitial cystitis (chronic) without hematuria: Secondary | ICD-10-CM | POA: Diagnosis not present

## 2017-05-12 DIAGNOSIS — N179 Acute kidney failure, unspecified: Secondary | ICD-10-CM | POA: Diagnosis not present

## 2017-05-13 ENCOUNTER — Other Ambulatory Visit: Payer: Medicare Other

## 2017-05-13 ENCOUNTER — Other Ambulatory Visit (INDEPENDENT_AMBULATORY_CARE_PROVIDER_SITE_OTHER): Payer: Medicare Other

## 2017-05-13 DIAGNOSIS — E890 Postprocedural hypothyroidism: Secondary | ICD-10-CM | POA: Diagnosis not present

## 2017-05-13 DIAGNOSIS — Z794 Long term (current) use of insulin: Secondary | ICD-10-CM

## 2017-05-13 DIAGNOSIS — E1165 Type 2 diabetes mellitus with hyperglycemia: Secondary | ICD-10-CM | POA: Diagnosis not present

## 2017-05-13 DIAGNOSIS — D631 Anemia in chronic kidney disease: Secondary | ICD-10-CM | POA: Diagnosis not present

## 2017-05-13 DIAGNOSIS — M961 Postlaminectomy syndrome, not elsewhere classified: Secondary | ICD-10-CM | POA: Diagnosis not present

## 2017-05-13 LAB — LIPID PANEL
Cholesterol: 249 mg/dL — ABNORMAL HIGH (ref 0–200)
HDL: 52.3 mg/dL (ref >39.00)
LDL Cholesterol: 173 mg/dL — ABNORMAL HIGH (ref 0–99)
NonHDL: 197.12
Total CHOL/HDL Ratio: 5
Triglycerides: 123 mg/dL (ref 0.0–149.0)
VLDL: 24.6 mg/dL (ref 0.0–40.0)

## 2017-05-13 LAB — T4, FREE: Free T4: 0.64 ng/dL (ref 0.60–1.60)

## 2017-05-13 LAB — BASIC METABOLIC PANEL
BUN: 12 mg/dL (ref 6–23)
CO2: 22 mEq/L (ref 19–32)
Calcium: 9.4 mg/dL (ref 8.4–10.5)
Chloride: 105 mEq/L (ref 96–112)
Creatinine, Ser: 1.99 mg/dL — ABNORMAL HIGH (ref 0.40–1.20)
GFR: 32.13 mL/min — ABNORMAL LOW (ref >60.00)
Glucose, Bld: 343 mg/dL — ABNORMAL HIGH (ref 70–99)
Potassium: 4.6 mEq/L (ref 3.5–5.1)
Sodium: 137 mEq/L (ref 135–145)

## 2017-05-13 LAB — MICROALBUMIN / CREATININE URINE RATIO
Creatinine,U: 598.7 mg/dL
Microalb Creat Ratio: 2.5 mg/g (ref 0.0–30.0)
Microalb, Ur: 14.7 mg/dL — ABNORMAL HIGH (ref 0.0–1.9)

## 2017-05-13 LAB — TSH: TSH: 12.63 u[IU]/mL — ABNORMAL HIGH (ref 0.35–4.50)

## 2017-05-13 LAB — HEMOGLOBIN A1C: Hgb A1c MFr Bld: 8.2 % — ABNORMAL HIGH (ref 4.6–6.5)

## 2017-05-14 ENCOUNTER — Ambulatory Visit (INDEPENDENT_AMBULATORY_CARE_PROVIDER_SITE_OTHER): Payer: Medicare Other | Admitting: Internal Medicine

## 2017-05-14 ENCOUNTER — Encounter: Payer: Self-pay | Admitting: Internal Medicine

## 2017-05-14 ENCOUNTER — Ambulatory Visit: Payer: Self-pay | Admitting: *Deleted

## 2017-05-14 ENCOUNTER — Other Ambulatory Visit: Payer: Self-pay | Admitting: *Deleted

## 2017-05-14 VITALS — BP 126/86 | HR 86 | Ht 66.0 in | Wt 270.0 lb

## 2017-05-14 DIAGNOSIS — E0822 Diabetes mellitus due to underlying condition with diabetic chronic kidney disease: Secondary | ICD-10-CM

## 2017-05-14 DIAGNOSIS — Z794 Long term (current) use of insulin: Secondary | ICD-10-CM | POA: Diagnosis not present

## 2017-05-14 DIAGNOSIS — I10 Essential (primary) hypertension: Secondary | ICD-10-CM | POA: Diagnosis not present

## 2017-05-14 DIAGNOSIS — N184 Chronic kidney disease, stage 4 (severe): Secondary | ICD-10-CM | POA: Diagnosis not present

## 2017-05-14 NOTE — Assessment & Plan Note (Signed)
Mild uncontrolled, o/w stable overall by history and exam, recent data reviewed with pt, and pt to continue medical treatment as before as declines tx change,  to f/u any worsening symptoms or concerns Lab Results  Component Value Date   HGBA1C 8.2 (H) 05/13/2017

## 2017-05-14 NOTE — Patient Instructions (Addendum)
Please continue all other medications as before, and refills have been done if requested.  Please have the pharmacy call with any other refills you may need.  Please continue your efforts at being more active, low cholesterol diet, and weight control.  You are otherwise up to date with prevention measures today.  Please keep your appointments with your specialists as you may have planned  Please return in 6 months, or sooner if needed 

## 2017-05-14 NOTE — Assessment & Plan Note (Signed)
stable overall by history and exam, recent data reviewed with pt, and pt to continue medical treatment as before,  to f/u any worsening symptoms or concerns BP Readings from Last 3 Encounters:  05/14/17 126/86  05/03/17 (!) 118/52  05/01/17 (!) 100/50

## 2017-05-14 NOTE — Assessment & Plan Note (Signed)
CKD-4 with improved Cr to 1.1 yesterday with recent hydration; cont to monitor, sees renal tomorrow as well

## 2017-05-14 NOTE — Patient Outreach (Signed)
[facility]) Care Management  05/14/2017  [name] 01/02/1951 [id number]  Transition of Care Referral  Referral Date: 05/02/17 Referral Source: Inpatient Date of Discharge: 05/03/17 Facility: [facility] Discharge Diagnosis: UTI with lactic acidosis and hypotension Insurance: Medicare/BCBS  Outreach attempt # 3, spoke with patient. HIPAA verified with patient.  Social: Patient lives with her husband and daughter. She is independent with her ADLs. She transports herself to all of her medical appointments. Patient uses a RW or cane to assist with mobility.   Conditions: Past Medical Hx: UTI, CKD 4, DM, Anemic of Chronic Dx, Depression, HTN, CAD, Hypothyroidism, Interstitial Cystitis Patient is having difficulty managing medical conditions. She acknowledged having renal issues, however denies having CKD 4. She stated, "She is going to see the kidney doctor due to having a few problems with her kidneys". Patient denied having COPD, which is listed in EMR. She is having difficulty affording her DM medications. Her last Hemoglobin A1c was 8.2 on 05/13/17. She continues to have symptoms of back pain, malaise, and fatigue. She stated, the Augmentin causes nausea. She attempts to take the Augmentin as prescribed by MD.     Medications: Patient takes greater than 10 medications, per EMR. She voiced having side effects from her medications. RN CM [place]) educated patient on taking medication with food.  Appointments: Patient has an upcoming appointment with PCP on 05/14/17. Patient has an appointment with the Nephrologist and Neurologist on 05/14/17.  Consent: [facility] services reviewed and discussed with patient. Verbal consent received.  Plan: RN CM will send referral to [facility] RN for further in home eval/assessment of care needs and management of chronic conditions. RN CM advised patient to contact RNCM for any needs or concerns.  Lake Bells, RN, BSN, MHA/MSL,  [place] Telephonic Care Manager Coordinator [facility] Direct Phone: [phone] Toll Free: [phone] Fax: [phone]

## 2017-05-14 NOTE — Progress Notes (Signed)
Subjective:    Patient ID: [name], female    DOB: Mar 11, 1951, 66 y.o.   MRN: [id number]  HPI  Here to f/u s/p recent hospn 7/26-28 with UTI, sepsis, AKI on CKD4, hyperkalemia and dehydration in the setting of chronic DM, anemia, HTN, CAD, hypothyroidism, Interstitial cystitis and depression, tx with IV antibx, kayexalate, and IVF,  TSH was elevated at > 22 but T4 normal, no med change initiated.  Urine cx equivocal and blood cx neg.  Was able to be d/c on 5 days augmentin.  Seen per urology yesterday with recommendation to finish the augmentin and restart the trimethoprim.  D/c home with [facility] PT and OT.  BMP done yesterday at urology with cr 1.1, and K 4.4.  Having ongoing LBP and plans to f/u with NS tomorrow.  No new complaints, and is still having some malaise and fatigue, though improved from 1 wk ago.  Declines further lab including CBC Past Medical History:  Diagnosis Date  . Anxiety   . Arthritis   . CERVICAL RADICULOPATHY, LEFT   . CKD (chronic kidney disease)   . COMMON MIGRAINE   . CORONARY ARTERY DISEASE   . Cystitis   . DEPRESSION   . Diabetes mellitus, type II ([place])   . DIVERTICULOSIS-COLON   . Gastric ulcer 04/2008  . Gastroparesis   . GERD (gastroesophageal reflux disease)   . Hiatal hernia   . Hyperlipidemia   . Hypertension   . Hypothyroidism   . INSOMNIA-SLEEP DISORDER-UNSPEC   . Iron deficiency anemia   . Wears glasses    Past Surgical History:  Procedure Laterality Date  . ABDOMINAL HYSTERECTOMY    . BACK SURGERY  03/2016  . CHOLECYSTECTOMY  06/2009  . COLONOSCOPY    . ESOPHAGOGASTRODUODENOSCOPY    . EYE SURGERY Bilateral    lasik  . Gastric Wedge resection lipoma  11/2007   x2 with laparotomy and gastrostomy  . Left knee replacement    . Rigth knee replacement with revision  04/2008   Dr. Berenice Primas  . ROTATOR CUFF REPAIR Left 01/2009  . s/p bladder surgury  09/2009   Dr. Terance Hart    reports that she has quit smoking. She has never used smokeless  tobacco. She reports that she does not drink alcohol or use drugs. family history includes Coronary artery disease in her other; Diabetes in her brother and sister; Heart disease in her brother and sister; Hypertension in her other. Allergies  Allergen Reactions  . Hydrocodone Other (See Comments)    Tachycardia   . Ciprofloxacin Other (See Comments)    Dizziness   . Penicillins Hives    Has patient had a PCN reaction causing immediate rash, facial/tongue/throat swelling, SOB or lightheadedness with hypotension: Yes Has patient had a PCN reaction causing severe rash involving mucus membranes or skin necrosis: No Has patient had a PCN reaction that required hospitalization No Has patient had a PCN reaction occurring within the last 10 years: Yes If all of the above answers are "NO", then may proceed with Cephalosporin use.    Current Outpatient Prescriptions on File Prior to Visit  Medication Sig Dispense Refill  . amitriptyline (ELAVIL) 10 MG tablet Take 10 mg by mouth at bedtime.     Marland Kitchen amLODipine-valsartan (EXFORGE) 10-320 MG tablet Take 1 tablet by mouth daily. Annual appt due in Sept must see provider for future refills 90 tablet 0  . amoxicillin-clavulanate (AUGMENTIN) 500-125 MG tablet Take 1 tablet (500 mg total) by mouth  2 (two) times daily. 10 tablet 0  . aspirin 81 MG EC tablet Take 81 mg by mouth every evening.     . cloNIDine (CATAPRES) 0.1 MG tablet TAKE ONE TABLET BY MOUTH TWICE A DAY 15 tablet 0  . ELMIRON 100 MG capsule Take 100 mg by mouth 2 (two) times daily.     . Estradiol Acetate 0.1 MG/24HR RING Place 1 each vaginally every 3 (three) months. 1 each 4  . ferrous sulfate 325 (65 FE) MG tablet Take 325 mg by mouth 2 (two) times daily.     Marland Kitchen FLUoxetine (PROZAC) 20 MG capsule TAKE ONE CAPSULE BY MOUTH DAILY (Patient taking differently: Take 20 mg by mouth at bedtime) 90 capsule 3  . gabapentin (NEURONTIN) 600 MG tablet Take 600 mg by mouth 2 (two) times daily.    Marland Kitchen  glimepiride (AMARYL) 1 MG tablet TAKE 1 TABLET BY MOUTH WITH SUPPER ONCE A DAY PER DR. Dwyane Dee (Patient taking differently: Take 1 mg by mouth with supper if BGL is 250 or greater) 30 tablet 2  . glucose blood (ACCU-CHEK AVIVA PLUS) test strip Use to test blood sugar 3 times daily- Dx code E11.9 100 each 12  . HYDROcodone-acetaminophen (NORCO/VICODIN) 5-325 MG tablet Take 1 tablet by mouth every 6 (six) hours as needed for moderate pain or severe pain.     Marland Kitchen insulin aspart (NOVOLOG FLEXPEN) 100 UNIT/ML FlexPen Inject 10 Units into the skin 3 (three) times daily with meals.    . insulin glargine (LANTUS) 100 UNIT/ML injection Inject 25 Units into the skin at bedtime.    . isosorbide dinitrate (ISORDIL) 10 MG tablet TAKE ONE TABLET BY MOUTH DAILY (Patient taking differently: Take 10 mg by mouth once a day) 90 tablet 1  . lansoprazole (PREVACID) 15 MG capsule Take 15 mg by mouth every evening.     . levETIRAcetam (KEPPRA) 250 MG tablet Take 250 mg by mouth 2 (two) times daily.    Marland Kitchen levothyroxine (SYNTHROID, LEVOTHROID) 88 MCG tablet TAKE ONE TABLET BY MOUTH DAILY (Patient taking differently: Take 88 mcg by mouth in the morning) 15 tablet 0  . linaclotide (LINZESS) 72 MCG capsule Take 1 capsule (72 mcg total) by mouth daily before breakfast. (Patient taking differently: Take 72 mcg by mouth daily as needed (for constipation). ) 30 capsule 11  . meloxicam (MOBIC) 15 MG tablet Take 15 mg by mouth every evening.     . ondansetron (ZOFRAN) 4 MG tablet TAKE 1 TABLET BY MOUTH EVERY 6 HOURS AS NEEDED FOR NAUSEA (Patient taking differently: Take 4 mg by mouth every six hours as needed for nausea) 30 tablet 0  . simvastatin (ZOCOR) 20 MG tablet TAKE ONE TABLET BY MOUTH DAILY. OVERDUE FOR YEARLY PHYSICAL WITH LABS MUST SEE M.D. FOR REFILLS (Patient taking differently: Take 20 mg by mouth in the evening) 30 tablet 5  . trimethoprim (TRIMPEX) 100 MG tablet Take 100 mg by mouth every evening.   1  . venlafaxine XR  (EFFEXOR-XR) 37.5 MG 24 hr capsule Take 2 capsules (75 mg total) by mouth daily with breakfast. 60 capsule 1  . VICTOZA 18 MG/3ML SOPN Inject 0.2 mLs (1.2 mg total) into the skin daily. Inject once daily at the same time (Patient taking differently: Inject 1.2 mg into the skin at bedtime. ) 2 pen 3   No current facility-administered medications on file prior to visit.    Review of Systems  Constitutional: Negative for other unusual diaphoresis or sweats HENT: Negative  for ear discharge or swelling Eyes: Negative for other worsening visual disturbances Respiratory: Negative for stridor or other swelling  Gastrointestinal: Negative for worsening distension or other blood Genitourinary: Negative for retention or other urinary change Musculoskeletal: Negative for other MSK pain or swelling Skin: Negative for color change or other new lesions Neurological: Negative for worsening tremors and other numbness  Psychiatric/Behavioral: Negative for worsening agitation or other fatigue All other system neg per pt    Objective:   Physical Exam BP 126/86   Pulse 86   Ht 5\' 6"  (1.676 m)   Wt 270 lb (122.5 kg)   SpO2 100%   BMI 43.58 kg/m  VS noted,  Constitutional: Pt appears in NAD HENT: Head: NCAT.  Right Ear: External ear normal.  Left Ear: External ear normal.  Eyes: . Pupils are equal, round, and reactive to light. Conjunctivae and EOM are normal Nose: without d/c or deformity Neck: Neck supple. Gross normal ROM Cardiovascular: Normal rate and regular rhythm.   Pulmonary/Chest: Effort normal and breath sounds without rales or wheezing.  Abd: soft, NT + BS Neurological: Pt is alert. At baseline orientation, motor grossly intact Skin: Skin is warm. No rashes, other new lesions, no LE edema Psychiatric: Pt behavior is normal without agitation  No other exam findings Lab Results  Component Value Date   WBC 7.3 05/03/2017   HGB 8.5 (L) 05/03/2017   HCT 26.6 (L) 05/03/2017   PLT 183  05/03/2017   GLUCOSE 343 (H) 05/13/2017   CHOL 249 (H) 05/13/2017   TRIG 123.0 05/13/2017   HDL 52.30 05/13/2017   LDLDIRECT 156.6 01/07/2011   LDLCALC 173 (H) 05/13/2017   ALT 9 (L) 05/01/2017   AST 11 (L) 05/01/2017   NA 137 05/13/2017   K 4.6 05/13/2017   CL 105 05/13/2017   CREATININE 1.99 (H) 05/13/2017   BUN 12 05/13/2017   CO2 22 05/13/2017   TSH 12.63 (H) 05/13/2017   INR 1.43 05/01/2017   HGBA1C 8.2 (H) 05/13/2017   MICROALBUR 14.7 (H) 05/13/2017      Assessment & Plan:

## 2017-05-15 DIAGNOSIS — M5127 Other intervertebral disc displacement, lumbosacral region: Secondary | ICD-10-CM | POA: Diagnosis not present

## 2017-05-15 DIAGNOSIS — M5126 Other intervertebral disc displacement, lumbar region: Secondary | ICD-10-CM | POA: Diagnosis not present

## 2017-05-15 DIAGNOSIS — M48061 Spinal stenosis, lumbar region without neurogenic claudication: Secondary | ICD-10-CM | POA: Diagnosis not present

## 2017-05-15 DIAGNOSIS — I10 Essential (primary) hypertension: Secondary | ICD-10-CM | POA: Diagnosis not present

## 2017-05-15 DIAGNOSIS — N183 Chronic kidney disease, stage 3 (moderate): Secondary | ICD-10-CM | POA: Diagnosis not present

## 2017-05-15 NOTE — Progress Notes (Signed)
[name] is a 66 y.o. female.    Reason for Appointment: Diabetes follow-up   History of Present Illness   Diagnosis: Type 2 DIABETES MELITUS, date of diagnosis: 2000  Previous history: She had initially been treated with metformin and Amaryl and subsequently given Victoza which helped overall control and weight loss. In 9/13 because of significant hyperglycemia she was started on basal bolus insulin also. Had required relatively small doses of insulin. Her blood sugar control has been excellent with A1c between 5.3-6.0 although this may be relatively higher than expected. Tends to have relatively higher readings after supper In 6/15 she was told to resume her Victoza and stop her Lantus since she had been gaining weight with Lantus and NovoLog regimen.   Recent history:     INSULIN: Novolog 10 units before meals; Lantus 25 units daily   Non-insulin hypoglycemic drugs:     Her last A1c was 6.9 but now it is up to 8.2 She has not been seen in follow-up for several months now   Current blood sugar patterns and management:  Her blood sugar in the lab was 343 in the afternoon although she thinks she will fasting the day  She did not bring her monitor and not clear how often she is checking; she thinks her blood sugars are sometimes down to 140 in the morning and other times over 200  When hospitalized last month her blood sugars were doing reasonably well  Blood sugar was checked in the office today and it was 250 fasting  She thinks she is taking her LANTUS daily with 25 units as before  Victoza was stopped when she was hospitalized for renal failure; now she is still having some problems with decreased appetite and nausea  She is not really consistently watching her diet and eating fried chicken last night  Also not clear if taking her readings after meals  She thinks she is taking Novolog with her meals but because of her decreased appetite not clear if she  is doing this consistently  Not able to do any physical activity lately     Side effects from medications:  none  Monitors blood glucose: Once a day or less on average .    Glucometer:  Accu-Chek compact.          Blood Glucose readings as above     Meals: 1-2 meals per day. Supper at 7 PM pm           Physical activity: exercise: Limited, has had fatigue, back and leg pain.           Dietician visit: Most recent: 04/6733           Complications: are: None  Wt Readings from Last 3 Encounters:  05/16/17 269 lb 12.8 oz (122.4 kg)  05/14/17 270 lb (122.5 kg)  05/02/17 278 lb 3.5 oz (126.2 kg)    LABS:  Lab Results  Component Value Date   HGBA1C 8.2 (H) 05/13/2017   HGBA1C 6.9 (H) 11/11/2016   HGBA1C 6.4 02/12/2016   Lab Results  Component Value Date   MICROALBUR 14.7 (H) 05/13/2017   LDLCALC 173 (H) 05/13/2017   CREATININE 1.99 (H) 05/13/2017    Other active problems: See review of systems   Appointment on 05/13/2017  Component Date Value Ref Range Status  . Sodium 05/13/2017 137  135 - 145 mEq/L Final  . Potassium 05/13/2017 4.6  3.5 - 5.1 mEq/L Final  . Chloride 05/13/2017  105  96 - 112 mEq/L Final  . CO2 05/13/2017 22  19 - 32 mEq/L Final  . Glucose, Bld 05/13/2017 343* 70 - 99 mg/dL Final  . BUN 05/13/2017 12  6 - 23 mg/dL Final  . Creatinine, Ser 05/13/2017 1.99* 0.40 - 1.20 mg/dL Final  . Calcium 05/13/2017 9.4  8.4 - 10.5 mg/dL Final  . GFR 05/13/2017 32.13* >60.00 mL/min Final  . Cholesterol 05/13/2017 249* 0 - 200 mg/dL Final   ATP III Classification       Desirable:  < 200 mg/dL               Borderline High:  200 - 239 mg/dL          High:  > = 240 mg/dL  . Triglycerides 05/13/2017 123.0  0.0 - 149.0 mg/dL Final   Normal:  <150 mg/dLBorderline High:  150 - 199 mg/dL  . HDL 05/13/2017 52.30  >39.00 mg/dL Final  . VLDL 05/13/2017 24.6  0.0 - 40.0 mg/dL Final  . LDL Cholesterol 05/13/2017 173* 0 - 99 mg/dL Final  . Total CHOL/HDL Ratio 05/13/2017 5    Final                  Men          Women1/2 Average Risk     3.4          3.3Average Risk          5.0          4.42X Average Risk          9.6          7.13X Average Risk          15.0          11.0                      . NonHDL 05/13/2017 197.12   Final   NOTE:  Non-HDL goal should be 30 mg/dL higher than patient's LDL goal (i.e. LDL goal of < 70 mg/dL, would have non-HDL goal of < 100 mg/dL)  . Microalb, Ur 05/13/2017 14.7* 0.0 - 1.9 mg/dL Final  . Creatinine,U 05/13/2017 598.7  mg/dL Final  . Microalb Creat Ratio 05/13/2017 2.5  0.0 - 30.0 mg/g Final  . Hgb A1c MFr Bld 05/13/2017 8.2* 4.6 - 6.5 % Final   Glycemic Control Guidelines for People with Diabetes:Non Diabetic:  <6%Goal of Therapy: <7%Additional Action Suggested:  >8%   . TSH 05/13/2017 12.63* 0.35 - 4.50 uIU/mL Final  . Free T4 05/13/2017 0.64  0.60 - 1.60 ng/dL Final   Comment: Specimens from patients who are undergoing biotin therapy and /or ingesting biotin supplements may contain high levels of biotin.  The higher biotin concentration in these specimens interferes with this Free T4 assay.  Specimens that contain high levels  of biotin may cause false high results for this Free T4 assay.  Please interpret results in light of the total clinical presentation of the patient.      Allergies as of 05/16/2017      Reactions   Hydrocodone Other (See Comments)   Tachycardia   Ciprofloxacin Other (See Comments)   Dizziness   Penicillins Hives   Has patient had a PCN reaction causing immediate rash, facial/tongue/throat swelling, SOB or lightheadedness with hypotension: Yes Has patient had a PCN reaction causing severe rash involving mucus membranes or skin necrosis: No Has patient had a PCN reaction that  required hospitalization No Has patient had a PCN reaction occurring within the last 10 years: Yes If all of the above answers are "NO", then may proceed with Cephalosporin use.      Medication List       Accurate as of  05/16/17  8:44 AM. Always use your most recent med list.          amitriptyline 10 MG tablet Commonly known as:  ELAVIL Take 10 mg by mouth at bedtime.   amLODipine-valsartan 10-320 MG tablet Commonly known as:  EXFORGE Take 1 tablet by mouth daily. Annual appt due in Sept must see provider for future refills   amoxicillin-clavulanate 500-125 MG tablet Commonly known as:  AUGMENTIN Take 1 tablet (500 mg total) by mouth 2 (two) times daily.   aspirin 81 MG EC tablet Take 81 mg by mouth every evening.   cloNIDine 0.1 MG tablet Commonly known as:  CATAPRES TAKE ONE TABLET BY MOUTH TWICE A DAY   ELMIRON 100 MG capsule Generic drug:  pentosan polysulfate Take 100 mg by mouth 2 (two) times daily.   Estradiol Acetate 0.1 MG/24HR Ring Place 1 each vaginally every 3 (three) months.   ferrous sulfate 325 (65 FE) MG tablet Take 325 mg by mouth 2 (two) times daily.   FLUoxetine 20 MG capsule Commonly known as:  PROZAC TAKE ONE CAPSULE BY MOUTH DAILY   gabapentin 600 MG tablet Commonly known as:  NEURONTIN Take 600 mg by mouth 2 (two) times daily.   glimepiride 1 MG tablet Commonly known as:  AMARYL TAKE 1 TABLET BY MOUTH WITH SUPPER ONCE A DAY PER DR. Dwyane Dee   glucose blood test strip Commonly known as:  ACCU-CHEK AVIVA PLUS Use to test blood sugar 3 times daily- Dx code E11.9   HYDROcodone-acetaminophen 5-325 MG tablet Commonly known as:  NORCO/VICODIN Take 1 tablet by mouth every 6 (six) hours as needed for moderate pain or severe pain.   insulin glargine 100 UNIT/ML injection Commonly known as:  LANTUS Inject 25 Units into the skin at bedtime.   isosorbide dinitrate 10 MG tablet Commonly known as:  ISORDIL TAKE ONE TABLET BY MOUTH DAILY   lansoprazole 15 MG capsule Commonly known as:  PREVACID Take 15 mg by mouth every evening.   levETIRAcetam 250 MG tablet Commonly known as:  KEPPRA Take 250 mg by mouth 2 (two) times daily.   levothyroxine 88 MCG  tablet Commonly known as:  SYNTHROID, LEVOTHROID TAKE ONE TABLET BY MOUTH DAILY   linaclotide 72 MCG capsule Commonly known as:  LINZESS Take 1 capsule (72 mcg total) by mouth daily before breakfast.   meloxicam 15 MG tablet Commonly known as:  MOBIC Take 15 mg by mouth every evening.   NOVOLOG FLEXPEN 100 UNIT/ML FlexPen Generic drug:  insulin aspart Inject 10 Units into the skin 3 (three) times daily with meals.   ondansetron 4 MG tablet Commonly known as:  ZOFRAN TAKE 1 TABLET BY MOUTH EVERY 6 HOURS AS NEEDED FOR NAUSEA   simvastatin 20 MG tablet Commonly known as:  ZOCOR TAKE ONE TABLET BY MOUTH DAILY. OVERDUE FOR YEARLY PHYSICAL WITH LABS MUST SEE M.D. FOR REFILLS   trimethoprim 100 MG tablet Commonly known as:  TRIMPEX Take 100 mg by mouth every evening.   venlafaxine XR 37.5 MG 24 hr capsule Commonly known as:  EFFEXOR-XR Take 2 capsules (75 mg total) by mouth daily with breakfast.   VICTOZA 18 MG/3ML Sopn Generic drug:  liraglutide Inject 0.2 mLs (1.2  mg total) into the skin daily. Inject once daily at the same time       Allergies:  Allergies  Allergen Reactions  . Hydrocodone Other (See Comments)    Tachycardia   . Ciprofloxacin Other (See Comments)    Dizziness   . Penicillins Hives    Has patient had a PCN reaction causing immediate rash, facial/tongue/throat swelling, SOB or lightheadedness with hypotension: Yes Has patient had a PCN reaction causing severe rash involving mucus membranes or skin necrosis: No Has patient had a PCN reaction that required hospitalization No Has patient had a PCN reaction occurring within the last 10 years: Yes If all of the above answers are "NO", then may proceed with Cephalosporin use.     Past Medical History:  Diagnosis Date  . Anxiety   . Arthritis   . CERVICAL RADICULOPATHY, LEFT   . CKD (chronic kidney disease)   . COMMON MIGRAINE   . CORONARY ARTERY DISEASE   . Cystitis   . DEPRESSION   . Diabetes  mellitus, type II ([place])   . DIVERTICULOSIS-COLON   . Gastric ulcer 04/2008  . Gastroparesis   . GERD (gastroesophageal reflux disease)   . Hiatal hernia   . Hyperlipidemia   . Hypertension   . Hypothyroidism   . INSOMNIA-SLEEP DISORDER-UNSPEC   . Iron deficiency anemia   . Wears glasses     Past Surgical History:  Procedure Laterality Date  . ABDOMINAL HYSTERECTOMY    . BACK SURGERY  03/2016  . CHOLECYSTECTOMY  06/2009  . COLONOSCOPY    . ESOPHAGOGASTRODUODENOSCOPY    . EYE SURGERY Bilateral    lasik  . Gastric Wedge resection lipoma  11/2007   x2 with laparotomy and gastrostomy  . Left knee replacement    . Rigth knee replacement with revision  04/2008   Dr. Berenice Primas  . ROTATOR CUFF REPAIR Left 01/2009  . s/p bladder surgury  09/2009   Dr. Terance Hart    Family History  Problem Relation Age of Onset  . Diabetes Sister        x 3  . Heart disease Sister        x2  . Diabetes Brother        x3  . Heart disease Brother        x2  . Coronary artery disease Other        female 1st degree  . Hypertension Other   . Colon cancer Neg Hx     Social History:  reports that she has quit smoking. She has never used smokeless tobacco. She reports that she does not drink alcohol or use drugs.  Review of Systems:  Hypertension: Her blood pressure is treated with metoprolol, clonidine Twice a day and Exforge Medications not changed by PCP yesterday  CKD: Her creatinine is moderately increased, has recovered since she had acute renal failure last month possibly from dehydration but not clear of the etiology   Lab Results  Component Value Date   CREATININE 1.99 (H) 05/13/2017   CREATININE 1.96 (H) 05/03/2017   CREATININE 3.86 (H) 05/02/2017   CREATININE 5.29 (H) 05/01/2017     Lipids: Followed by PCP, LDL has been inconsistently controlled,  on 20 mg Zocor She is also on amlodipine Despite her stating that she is taking her Zocor her LDL is significantly high Several years ago  she was taking Lipitor brand name and this was changed but she does not think she had side effects with this  Lab Results  Component Value Date   CHOL 249 (H) 05/13/2017   HDL 52.30 05/13/2017   LDLCALC 173 (H) 05/13/2017   LDLDIRECT 156.6 01/07/2011   TRIG 123.0 05/13/2017   CHOLHDL 5 05/13/2017     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 Previously she had I-131 treatment on 08/20/11 for Graves' disease She denies missing any doses of Synthroid or taking any iron or calcium pills Not clear why her TSH is markedly increased as of last month and improved within 2 weeks without any dose change She is complaining of feeling cold  Lab Results  Component Value Date   TSH 12.63 (H) 05/13/2017   TSH 22.770 (H) 05/02/2017   TSH 1.33 11/11/2016   FREET4 0.64 05/13/2017   FREET4 0.64 05/02/2017   FREET4 0.82 11/11/2016    On Gabapentin For leg pains, was told to have neuropathy by sports medicine specialist    Examination:   BP 136/76   Pulse 88   Ht 5\' 6"  (1.676 m)   Wt 269 lb 12.8 oz (122.4 kg)   SpO2 98%   BMI 43.55 kg/m   Body mass index is 43.55 kg/m.     ASSESSMENT/ PLAN:   Diabetes type 2, uncontrolled :   See history of present illness for detailed discussion of his current management, blood sugar patterns and problems identified  Recent A1c 8.2  Blood sugars are  difficult to assess since she does not check regularly and did not bring her monitor Although she claims her blood sugars are sometimes.down to 140 her lab glucose and her fasting glucose in the office today shows very high readings She appears to be more insulin resistant and that sugars are higher with stopping Victoza last month   Recommendations made:   increase Lantus by at least 5 units  She will increase it every 3-4 days again until morning sugars are below 140  She does need to check her sugars much more regularly including after meals and everyday in  the morning  Despite her not eating she does need mealtime insulin and she will take 10-15 units  Advised her to cut back on high-fat and fried foods  Increase fluid intake  Hypothyroidism:   She is significantly hypothyroid and suspect that she probably was not taking her Synthroid regularly or possibly losing it with previous vomiting Since TSH is coming down significantly since her hospital admission will increase the dose only slightly up to 100 g TSH will be rechecked  on the next visit  HYPERTENSION: Controlled, Continue same regimen    Patient Instructions  Lantus 30 units at nite  15 Novolog for meals unless eating light      Counseling time on subjects discussed above is over 50% of today's 25 minute visit   Mong Neal 05/16/2017, 8:44 AM   Note: This office note was prepared with Estate agent. Any transcriptional errors that result from this process are unintentional.

## 2017-05-16 ENCOUNTER — Other Ambulatory Visit: Payer: Self-pay

## 2017-05-16 ENCOUNTER — Other Ambulatory Visit: Payer: Self-pay | Admitting: *Deleted

## 2017-05-16 ENCOUNTER — Ambulatory Visit (INDEPENDENT_AMBULATORY_CARE_PROVIDER_SITE_OTHER): Payer: Medicare Other | Admitting: Endocrinology

## 2017-05-16 ENCOUNTER — Encounter: Payer: Self-pay | Admitting: Endocrinology

## 2017-05-16 VITALS — BP 136/76 | HR 88 | Ht 66.0 in | Wt 269.8 lb

## 2017-05-16 DIAGNOSIS — E1165 Type 2 diabetes mellitus with hyperglycemia: Secondary | ICD-10-CM

## 2017-05-16 DIAGNOSIS — Z794 Long term (current) use of insulin: Secondary | ICD-10-CM | POA: Diagnosis not present

## 2017-05-16 DIAGNOSIS — N184 Chronic kidney disease, stage 4 (severe): Secondary | ICD-10-CM

## 2017-05-16 DIAGNOSIS — E890 Postprocedural hypothyroidism: Secondary | ICD-10-CM | POA: Diagnosis not present

## 2017-05-16 LAB — GLUCOSE, POCT (MANUAL RESULT ENTRY): POC Glucose: 252 mg/dl — AB (ref 70–99)

## 2017-05-16 MED ORDER — LEVOTHYROXINE SODIUM 100 MCG PO TABS: 100.0000 ug | ORAL_TABLET | Freq: Every day | ORAL | 3 refills | Status: DC

## 2017-05-16 MED ORDER — ATORVASTATIN CALCIUM 20 MG PO TABS: 20.0000 mg | ORAL_TABLET | Freq: Every day | ORAL | 3 refills | Status: DC

## 2017-05-16 NOTE — Telephone Encounter (Signed)
Called patient to let her know that I received a refill request for Victoza from the [facility] from [place] and the only pharmacy we have listed for her is the [facility] at [address] and wanted to make sure if she needed this medication and which pharmacy it needs to go to. I left her a voice message in hopes she will call us back and let us know.

## 2017-05-16 NOTE — Patient Instructions (Addendum)
Lantus 30 units at nite  Adjust weekly to have am sugar <140  15 Novolog for meals unless eating light  New Rx for thyroid and Lipitor

## 2017-05-16 NOTE — Patient Outreach (Signed)
[place] [facility]) Care Management  05/16/2017  [name] 1951-02-08 [id number]   Referral received from telephone care manager for home assessment and transition of care as member was recently discharged from hospital.  She was admitted 7/26-7/28 for chronic kidney disease.  Per chart she also has history of hypertension, diabetes, and coronary artherosclerosis.  Call placed to member, no answer, unable to leave message as no voice mail was noted.  Will make second attempt to contact next week.  Valente David, [place], MSN [place] [phone]

## 2017-05-19 ENCOUNTER — Other Ambulatory Visit: Payer: Self-pay | Admitting: *Deleted

## 2017-05-19 ENCOUNTER — Telehealth: Payer: Self-pay

## 2017-05-19 MED ORDER — VICTOZA 18 MG/3ML ~~LOC~~ SOPN: 1.2000 mg | PEN_INJECTOR | Freq: Every day | SUBCUTANEOUS | 3 refills | Status: DC

## 2017-05-19 NOTE — Telephone Encounter (Signed)
Attempted to contact patient to verify pharmacy for her victoza prescription

## 2017-05-19 NOTE — Patient Outreach (Signed)
[place] [facility]) Care Management  05/19/2017  [name] 08-27-1951 [id number]   2nd attempt made to contact member, no answer, unable to leave a message.  Will make 3rd attempt to contact tomorrow.  Valente David, [place], MSN [place] [phone]

## 2017-05-20 ENCOUNTER — Other Ambulatory Visit: Payer: Self-pay | Admitting: *Deleted

## 2017-05-20 NOTE — Patient Outreach (Signed)
[place] [facility]) Care Management  05/20/2017  [name] 1950-11-07 [id number]   3rd attempt made to contact member for transition of care, no answer, unable to leave voice message.  Will send outreach letter, if no response in 10 business days, will close case.  Valente David, [place], MSN [place] [phone]

## 2017-05-21 ENCOUNTER — Ambulatory Visit: Payer: Self-pay | Admitting: *Deleted

## 2017-05-21 DIAGNOSIS — I10 Essential (primary) hypertension: Secondary | ICD-10-CM | POA: Diagnosis not present

## 2017-05-21 DIAGNOSIS — M5127 Other intervertebral disc displacement, lumbosacral region: Secondary | ICD-10-CM | POA: Diagnosis not present

## 2017-05-21 DIAGNOSIS — Z6841 Body Mass Index (BMI) 40.0 and over, adult: Secondary | ICD-10-CM | POA: Diagnosis not present

## 2017-05-21 DIAGNOSIS — M961 Postlaminectomy syndrome, not elsewhere classified: Secondary | ICD-10-CM | POA: Diagnosis not present

## 2017-05-22 ENCOUNTER — Other Ambulatory Visit: Payer: Self-pay | Admitting: Pharmacist

## 2017-05-22 NOTE — Patient Outreach (Signed)
[place] [facility]) Care Management  05/22/2017  SOMAYA GRASSI 1951-08-04 [id number]  Successful phone follow-up with patient.  Patient reports she received applications for patient assistance yesterday and has not looked at yet.    She reports she saw Dr Dwyane Dee and is no longer taking glimepiride.  She reports she is taking atorvastatin and simvastatin.  Atorvastatin was started by Dr Dwyane Dee 05/16/17.  She reports she has been taking both statins---reports no one told her to stop one.   Counseled patient to only take one statin.    Plan:  1) Will in-basket Dr Dwyane Dee re:  Duplicate statin therapy and call patient once MD confirms simvastatin can be removed from medication list and discontinued.    2) Patient to complete patient assistance applications.   3) provided patient with [facility] RN phone number as [place] has been trying to reach patient.   Karrie Meres, PharmD, [place] [phone]

## 2017-05-23 ENCOUNTER — Other Ambulatory Visit: Payer: Self-pay | Admitting: *Deleted

## 2017-05-23 ENCOUNTER — Other Ambulatory Visit: Payer: Self-pay | Admitting: Pharmacist

## 2017-05-23 NOTE — Patient Outreach (Signed)
[place] [facility]) Care Management  05/23/2017  KITTY CADAVID 1950/12/06 [id number]   Call received from K. Ruedinger, Pharmacist, stating he has been successful with contact (same number this care manager has been using to make outreach attempts) and pharmacy is working with member on medication assistance.  He report he provided member with contact information for this care manager, however this care manager has not received any calls from member.  Call placed to member today, 4th attempt, as last successful contact with Mr. Jenna Luo was today, but call was unsuccessful.  Outreach letter already sent, will continue to await response.  If no response, will close case.  Valente David, [place], MSN [place] [phone]

## 2017-05-23 NOTE — Patient Outreach (Signed)
[name]) Care Management  05/23/2017  [name] 03-Jan-1951 [id number]  Successful phone follow-up to patient.  HIPAA details verified.   Counseled patient received message back from Dr Dwyane Dee she is to discontinue simvastatin and take atorvastatin.  Patient verbalized understanding.   Patient reports at this time, her pharmacy related needs are related to patient assistance---will have [place] follow-up with patient regarding applications---patient confirms she has [place] patient assistance applications she needs to complete.   Patient had reported not reaching [place]---placed call to [place] and advised her [facility] Pharmacist reached patient today.    Plan:  Will send in-basket message to [place] to follow-up with patient regarding patient assistance.   Patient aware she can contact [facility] Pharmacist if needed.    Karrie Meres, PharmD, [place] [phone]  Elayne Snare, MD  Amori Colomb, [place], [facility]        Yes please discontinue simvastatin, she was told to do this   Previous Messages    ----- Message -----  From: [name], [facility]  Sent: 05/22/2017  4:58 PM  To: Elayne Snare, MD  Subject: med question                   Dr Dwyane Dee:   Followed up with patient today---noted you started atorvastatin 05/16/17 and she confirms you discontinued glimepiride.    Patient still taking simvastatin and atorvastatin (both on medication list) ---is she to discontinue simvastatin in favor of more potent statin? I counseled her to not take both as she has been doing.   Thanks   [name]

## 2017-06-03 ENCOUNTER — Other Ambulatory Visit: Payer: Self-pay | Admitting: *Deleted

## 2017-06-03 NOTE — Patient Outreach (Signed)
[place] [facility]) Care Management  06/03/2017  DAFINA SUK [birth year] [id number]   Unsuccessful outreach letter sent on 8/14, no response.  Will close case.  Will notify primary MD and care management assistant of case closure.  Valente David, [place], MSN [place] [phone]

## 2017-06-05 ENCOUNTER — Other Ambulatory Visit: Payer: Self-pay | Admitting: Pharmacy Technician

## 2017-06-05 NOTE — Patient Outreach (Signed)
[place] [facility]) Care Management  06/05/2017  MANESSA BULEY [birth year] [id number]  This patient was referred to me by Margie Ege to follow-up on patient assistance application's that were previously mailed to her. When [name] last spoke to her two weeks ago she had received the application's and was in the process of completing them. As of today I still have not received the application's and I will attempt to follow-up with her again on Tuesday if she doesn't return my call.   Doreene Burke, [place] [phone]

## 2017-06-10 ENCOUNTER — Encounter: Payer: Medicare Other | Attending: Psychology | Admitting: Psychology

## 2017-06-10 DIAGNOSIS — N189 Chronic kidney disease, unspecified: Secondary | ICD-10-CM | POA: Insufficient documentation

## 2017-06-10 DIAGNOSIS — K219 Gastro-esophageal reflux disease without esophagitis: Secondary | ICD-10-CM | POA: Insufficient documentation

## 2017-06-10 DIAGNOSIS — E785 Hyperlipidemia, unspecified: Secondary | ICD-10-CM | POA: Diagnosis not present

## 2017-06-10 DIAGNOSIS — F419 Anxiety disorder, unspecified: Secondary | ICD-10-CM | POA: Insufficient documentation

## 2017-06-10 DIAGNOSIS — K259 Gastric ulcer, unspecified as acute or chronic, without hemorrhage or perforation: Secondary | ICD-10-CM | POA: Insufficient documentation

## 2017-06-10 DIAGNOSIS — G894 Chronic pain syndrome: Secondary | ICD-10-CM | POA: Diagnosis not present

## 2017-06-10 DIAGNOSIS — E1122 Type 2 diabetes mellitus with diabetic chronic kidney disease: Secondary | ICD-10-CM | POA: Diagnosis not present

## 2017-06-10 DIAGNOSIS — E039 Hypothyroidism, unspecified: Secondary | ICD-10-CM | POA: Diagnosis not present

## 2017-06-10 DIAGNOSIS — K449 Diaphragmatic hernia without obstruction or gangrene: Secondary | ICD-10-CM | POA: Insufficient documentation

## 2017-06-10 DIAGNOSIS — G8929 Other chronic pain: Secondary | ICD-10-CM | POA: Diagnosis present

## 2017-06-10 DIAGNOSIS — I120 Hypertensive chronic kidney disease with stage 5 chronic kidney disease or end stage renal disease: Secondary | ICD-10-CM | POA: Insufficient documentation

## 2017-06-10 DIAGNOSIS — F329 Major depressive disorder, single episode, unspecified: Secondary | ICD-10-CM | POA: Diagnosis not present

## 2017-06-10 DIAGNOSIS — M545 Low back pain: Secondary | ICD-10-CM | POA: Diagnosis not present

## 2017-06-10 DIAGNOSIS — G479 Sleep disorder, unspecified: Secondary | ICD-10-CM | POA: Insufficient documentation

## 2017-06-12 ENCOUNTER — Encounter: Payer: Self-pay | Admitting: Psychology

## 2017-06-12 ENCOUNTER — Encounter (HOSPITAL_BASED_OUTPATIENT_CLINIC_OR_DEPARTMENT_OTHER): Payer: Medicare Other | Admitting: Psychology

## 2017-06-12 DIAGNOSIS — F329 Major depressive disorder, single episode, unspecified: Secondary | ICD-10-CM | POA: Diagnosis not present

## 2017-06-12 DIAGNOSIS — G479 Sleep disorder, unspecified: Secondary | ICD-10-CM | POA: Diagnosis not present

## 2017-06-12 DIAGNOSIS — I120 Hypertensive chronic kidney disease with stage 5 chronic kidney disease or end stage renal disease: Secondary | ICD-10-CM | POA: Diagnosis not present

## 2017-06-12 DIAGNOSIS — M545 Low back pain: Secondary | ICD-10-CM | POA: Diagnosis not present

## 2017-06-12 DIAGNOSIS — F419 Anxiety disorder, unspecified: Secondary | ICD-10-CM | POA: Diagnosis not present

## 2017-06-12 DIAGNOSIS — G894 Chronic pain syndrome: Secondary | ICD-10-CM
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
## 2017-06-16 DIAGNOSIS — N3010 Interstitial cystitis (chronic) without hematuria: Secondary | ICD-10-CM | POA: Diagnosis not present

## 2017-06-23 ENCOUNTER — Telehealth: Payer: Self-pay | Admitting: *Deleted

## 2017-06-23 NOTE — Telephone Encounter (Signed)
Pt was prescribed femring in 02/2017 states ring is not helping relieve any vasomotor symptoms and its too expensive at $200 per month, pt said estrace 2 mg was no longer working as well. She asked if you have any other thoughts of another HRT that could be prescribed? Or do you prefer OV to discuss? Please advise

## 2017-06-23 NOTE — Telephone Encounter (Signed)
Yes, needs to come in to discuss options.

## 2017-06-23 NOTE — Telephone Encounter (Signed)
Pt aware, transferred front desk to schedule.

## 2017-06-24 ENCOUNTER — Other Ambulatory Visit: Payer: Medicare Other

## 2017-06-24 DIAGNOSIS — N183 Chronic kidney disease, stage 3 (moderate): Secondary | ICD-10-CM | POA: Diagnosis not present

## 2017-06-24 DIAGNOSIS — D631 Anemia in chronic kidney disease: Secondary | ICD-10-CM | POA: Diagnosis not present

## 2017-06-24 DIAGNOSIS — E1165 Type 2 diabetes mellitus with hyperglycemia: Secondary | ICD-10-CM

## 2017-06-24 DIAGNOSIS — Z794 Long term (current) use of insulin: Secondary | ICD-10-CM | POA: Diagnosis not present

## 2017-06-25 ENCOUNTER — Ambulatory Visit (INDEPENDENT_AMBULATORY_CARE_PROVIDER_SITE_OTHER): Payer: Medicare Other | Admitting: Endocrinology

## 2017-06-25 ENCOUNTER — Other Ambulatory Visit: Payer: Medicare Other

## 2017-06-25 ENCOUNTER — Encounter: Payer: Self-pay | Admitting: Endocrinology

## 2017-06-25 VITALS — HR 81 | Temp 98.6°F | Wt 275.5 lb

## 2017-06-25 DIAGNOSIS — E1165 Type 2 diabetes mellitus with hyperglycemia: Secondary | ICD-10-CM

## 2017-06-25 DIAGNOSIS — Z794 Long term (current) use of insulin: Secondary | ICD-10-CM | POA: Diagnosis not present

## 2017-06-25 NOTE — Patient Instructions (Addendum)
LANTUS 15 IN AM and at supper  Novolog 25-30 units at dinner or RELION BRAND r INSULIN  Check blood sugars on waking up  DAILY  Also check blood sugars about 2 hours after a meal and do this after different meals by rotation  Recommended blood sugar levels on waking up is 90-130 and about 2 hours after meal is 130-160  Please bring your blood sugar monitor to each visit, thank you

## 2017-06-25 NOTE — Progress Notes (Signed)
[name] is a 66 y.o. female.    Reason for Appointment: Diabetes follow-up   History of Present Illness   Diagnosis: Type 2 DIABETES MELITUS, date of diagnosis: 2000  Previous history: She had initially been treated with metformin and Amaryl and subsequently given Victoza which helped overall control and weight loss. In 9/13 because of significant hyperglycemia she was started on basal bolus insulin also. Had required relatively small doses of insulin. Her blood sugar control has been excellent with A1c between 5.3-6.0 although this may be relatively higher than expected. Tends to have relatively higher readings after supper In 6/15 she was told to resume her Victoza and stop her Lantus since she had been gaining weight with Lantus and NovoLog regimen.   Recent history:     INSULIN: Novolog 15-20 units before meals; Lantus 30 units daily   Non-insulin hypoglycemic drugs:     Her last A1c was 6.9 but on the last visit was 9.2    Current blood sugar patterns and management:  She now said that she is getting insulin leftover from her daughter's insulin supply and she thinks that her NovoLog maybe out of date  He was taking up to 20 units of Novolog for relatively small meals in the evening her blood sugars are averaging nearly 300 after supper  She was told to take LANTUS at bedtime last time because of relatively high fasting readings  She is using a syringe for this and only has a 30 units syringe currently  With doing this her fasting readings are mostly fairly good but still somewhat high overall  She did have one episode of mild hypoglycemia late at night with a glucose of 66  Lab results not available today  She thinks she is eating small meals at the evening meal and usually not much during the day  Not able to do any exercise because of various issues and pain     Side effects from medications:  none  Monitors blood glucose: Once a day or less on  average .    Glucometer:  Accu-Chek compact.          Blood Glucose readings as   Mean values apply above for all meters except median for One Touch  PRE-MEAL Fasting Lunch Dinner Bedtime Overall  Glucose range:  94-1 88    95-451    Mean/median: 156    292  208    POST-MEAL PC Breakfast PC Lunch PC Dinner  Glucose range:     Mean/median:           Meals: 1-2 meals per day. Supper at 7 PM pm           Physical activity: exercise: Limited, has had fatigue, back and leg pain.           Dietician visit: Most recent: 11/5425           Complications: are: None  Wt Readings from Last 3 Encounters:  06/25/17 275 lb 8 oz (125 kg)  05/16/17 269 lb 12.8 oz (122.4 kg)  05/14/17 270 lb (122.5 kg)    LABS:  Lab Results  Component Value Date   HGBA1C 8.2 (H) 05/13/2017   HGBA1C 6.9 (H) 11/11/2016   HGBA1C 6.4 02/12/2016   Lab Results  Component Value Date   MICROALBUR 14.7 (H) 05/13/2017   LDLCALC 173 (H) 05/13/2017   CREATININE 1.99 (H) 05/13/2017    Other active problems: See review of systems  No visits with results within 1 Week(s) from this visit.  Latest known visit with results is:  Office Visit on 05/16/2017  Component Date Value Ref Range Status  . POC Glucose 05/16/2017 252* 70 - 99 mg/dl Final    Allergies as of 06/25/2017      Reactions   Hydrocodone Other (See Comments)   Tachycardia   Ciprofloxacin Other (See Comments)   Dizziness   Penicillins Hives   Has patient had a PCN reaction causing immediate rash, facial/tongue/throat swelling, SOB or lightheadedness with hypotension: Yes Has patient had a PCN reaction causing severe rash involving mucus membranes or skin necrosis: No Has patient had a PCN reaction that required hospitalization No Has patient had a PCN reaction occurring within the last 10 years: Yes If all of the above answers are "NO", then may proceed with Cephalosporin use.      Medication List       Accurate as of 06/25/17 11:47 AM.  Always use your most recent med list.          amitriptyline 10 MG tablet Commonly known as:  ELAVIL Take 10 mg by mouth at bedtime.   amLODipine-valsartan 10-320 MG tablet Commonly known as:  EXFORGE Take 1 tablet by mouth daily. Annual appt due in Sept must see provider for future refills   aspirin 81 MG EC tablet Take 81 mg by mouth every evening.   atorvastatin 20 MG tablet Commonly known as:  LIPITOR Take 1 tablet (20 mg total) by mouth daily.   cloNIDine 0.1 MG tablet Commonly known as:  CATAPRES TAKE ONE TABLET BY MOUTH TWICE A DAY   ELMIRON 100 MG capsule Generic drug:  pentosan polysulfate Take 100 mg by mouth 2 (two) times daily.   Estradiol Acetate 0.1 MG/24HR Ring Place 1 each vaginally every 3 (three) months.   ferrous sulfate 325 (65 FE) MG tablet Take 325 mg by mouth 2 (two) times daily.   FLUoxetine 20 MG capsule Commonly known as:  PROZAC TAKE ONE CAPSULE BY MOUTH DAILY   gabapentin 600 MG tablet Commonly known as:  NEURONTIN Take 600 mg by mouth 2 (two) times daily.   glucose blood test strip Commonly known as:  ACCU-CHEK AVIVA PLUS Use to test blood sugar 3 times daily- Dx code E11.9   HYDROcodone-acetaminophen 5-325 MG tablet Commonly known as:  NORCO/VICODIN Take 1 tablet by mouth every 6 (six) hours as needed for moderate pain or severe pain.   insulin glargine 100 UNIT/ML injection Commonly known as:  LANTUS Inject 25 Units into the skin at bedtime.   isosorbide dinitrate 10 MG tablet Commonly known as:  ISORDIL TAKE ONE TABLET BY MOUTH DAILY   lansoprazole 15 MG capsule Commonly known as:  PREVACID Take 15 mg by mouth every evening.   levETIRAcetam 250 MG tablet Commonly known as:  KEPPRA Take 250 mg by mouth 2 (two) times daily.   levothyroxine 100 MCG tablet Commonly known as:  SYNTHROID, LEVOTHROID Take 1 tablet (100 mcg total) by mouth daily.   linaclotide 72 MCG capsule Commonly known as:  LINZESS Take 1 capsule (72  mcg total) by mouth daily before breakfast.   meloxicam 15 MG tablet Commonly known as:  MOBIC Take 15 mg by mouth every evening.   NOVOLOG FLEXPEN 100 UNIT/ML FlexPen Generic drug:  insulin aspart Inject 10 Units into the skin 3 (three) times daily with meals.   ondansetron 4 MG tablet Commonly known as:  ZOFRAN TAKE 1 TABLET BY MOUTH  EVERY 6 HOURS AS NEEDED FOR NAUSEA   trimethoprim 100 MG tablet Commonly known as:  TRIMPEX Take 100 mg by mouth every evening.   venlafaxine XR 37.5 MG 24 hr capsule Commonly known as:  EFFEXOR-XR Take 2 capsules (75 mg total) by mouth daily with breakfast.   VICTOZA 18 MG/3ML Sopn Generic drug:  liraglutide Inject 0.2 mLs (1.2 mg total) into the skin daily. Inject once daily at the same time       Allergies:  Allergies  Allergen Reactions  . Hydrocodone Other (See Comments)    Tachycardia   . Ciprofloxacin Other (See Comments)    Dizziness   . Penicillins Hives    Has patient had a PCN reaction causing immediate rash, facial/tongue/throat swelling, SOB or lightheadedness with hypotension: Yes Has patient had a PCN reaction causing severe rash involving mucus membranes or skin necrosis: No Has patient had a PCN reaction that required hospitalization No Has patient had a PCN reaction occurring within the last 10 years: Yes If all of the above answers are "NO", then may proceed with Cephalosporin use.     Past Medical History:  Diagnosis Date  . Anxiety   . Arthritis   . CERVICAL RADICULOPATHY, LEFT   . CKD (chronic kidney disease)   . COMMON MIGRAINE   . CORONARY ARTERY DISEASE   . Cystitis   . DEPRESSION   . Diabetes mellitus, type II ([place])   . DIVERTICULOSIS-COLON   . Gastric ulcer 04/2008  . Gastroparesis   . GERD (gastroesophageal reflux disease)   . Hiatal hernia   . Hyperlipidemia   . Hypertension   . Hypothyroidism   . INSOMNIA-SLEEP DISORDER-UNSPEC   . Iron deficiency anemia   . Wears glasses     Past  Surgical History:  Procedure Laterality Date  . ABDOMINAL HYSTERECTOMY    . BACK SURGERY  03/2016  . CHOLECYSTECTOMY  06/2009  . COLONOSCOPY    . ESOPHAGOGASTRODUODENOSCOPY    . EYE SURGERY Bilateral    lasik  . Gastric Wedge resection lipoma  11/2007   x2 with laparotomy and gastrostomy  . Left knee replacement    . Rigth knee replacement with revision  04/2008   Dr. Berenice Primas  . ROTATOR CUFF REPAIR Left 01/2009  . s/p bladder surgury  09/2009   Dr. Terance Hart    Family History  Problem Relation Age of Onset  . Diabetes Sister        x 3  . Heart disease Sister        x2  . Diabetes Brother        x3  . Heart disease Brother        x2  . Coronary artery disease Other        female 1st degree  . Hypertension Other   . Colon cancer Neg Hx     Social History:  reports that she has quit smoking. She has never used smokeless tobacco. She reports that she does not drink alcohol or use drugs.  Review of Systems:  Hypertension: Her blood pressure is treated with metoprolol, clonidine Twice a day and Exforge Medications not changed by PCP yesterday  CKD: Her creatinine is moderately increased, has recovered since she had acute renal failure last month possibly from dehydration but not clear of the etiology   Lab Results  Component Value Date   CREATININE 1.99 (H) 05/13/2017   CREATININE 1.96 (H) 05/03/2017   CREATININE 3.86 (H) 05/02/2017   CREATININE 5.29 (H)  05/01/2017     Lipids: Followed by PCP, LDL has been inconsistently controlled,  on 20 mg Zocor She is also on amlodipine Despite her stating that she is taking her Zocor her LDL is significantly high  Several years ago she was taking Lipitor brand name   Lab Results  Component Value Date   CHOL 249 (H) 05/13/2017   HDL 52.30 05/13/2017   LDLCALC 173 (H) 05/13/2017   LDLDIRECT 156.6 01/07/2011   TRIG 123.0 05/13/2017   CHOLHDL 5 05/13/2017     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  thyroid  supplement Previously she had I-131 treatment on 08/20/11 for Graves' disease She denies missing any doses of Synthroid or taking any iron or calcium pills Not clear why her TSH is markedly increased as of last month and improved within 2 weeks without any dose change  She is complaining of feeling cold  Lab Results  Component Value Date   TSH 12.63 (H) 05/13/2017   TSH 22.770 (H) 05/02/2017   TSH 1.33 11/11/2016   FREET4 0.64 05/13/2017   FREET4 0.64 05/02/2017   FREET4 0.82 11/11/2016    On Gabapentin For leg pains, was told to have neuropathy by sports medicine specialist    Examination:   Pulse 81   Temp 98.6 F (37 C) (Oral)   Wt 275 lb 8 oz (125 kg)   SpO2 100%   BMI 44.47 kg/m   Body mass index is 44.47 kg/m.     ASSESSMENT/ PLAN:   Diabetes type 2, uncontrolled :   See history of present illness for detailed discussion of his current management, blood sugar patterns and problems identified  Her blood sugars are better fasting but significantly high at night as discussed above This may be related to her using expired insulin for her evening meal   Recommendations made:   Change Lantus to twice a day  Stop using expired Novolog and get [employer] brand Regular Insulin  She may need only 10-15 units to start with  She will call if she has any difficulty with blood sugars but continue Lantus for now  She may need to go to [facility] from [place] also if she cannot get an unexpired supply of Lantus  Hypothyroidism:    Will need to get reports of TSH  HYPERTENSION: Controlled    Patient Instructions  LANTUS 15 IN AM and at supper  Novolog 25-30 units at dinner or RELION BRAND r INSULIN  Check blood sugars on waking up  DAILY  Also check blood sugars about 2 hours after a meal and do this after different meals by rotation  Recommended blood sugar levels on waking up is 90-130 and about 2 hours after meal is 130-160  Please bring your blood sugar  monitor to each visit, thank you         [facility] 06/25/2017, 11:47 AM   Note: This office note was prepared with Dragon voice recognition system technology. Any transcriptional errors that result from this process are unintentional.

## 2017-06-26 ENCOUNTER — Telehealth: Payer: Self-pay | Admitting: Endocrinology

## 2017-06-26 LAB — FRUCTOSAMINE: Fructosamine: 360 umol/L — ABNORMAL HIGH (ref 0–285)

## 2017-06-26 NOTE — Telephone Encounter (Signed)
Patient called in reference to wanting to get lab results. Please call patient and advise.

## 2017-06-27 LAB — SPECIMEN STATUS REPORT

## 2017-06-27 LAB — TSH: TSH: 1.51 u[IU]/mL (ref 0.450–4.500)

## 2017-06-27 NOTE — Telephone Encounter (Signed)
Please advise 

## 2017-06-30 NOTE — Telephone Encounter (Signed)
She needs to see her PCP this week for evaluation

## 2017-06-30 NOTE — Telephone Encounter (Signed)
Called patient and let her know her labs were normal. She stated that she is having hot and cold flashes and when she stands up she gets weak and can hardly walk. She stated that she has an appointment with her OBGYN on Wednesday and she will be having back surgery in October. Not sure if there is anything you can do for her but I told her I would send you the message.

## 2017-06-30 NOTE — Telephone Encounter (Signed)
Called patient and let her know to see her PCP per Dr. Dwyane Dee.

## 2017-06-30 NOTE — Telephone Encounter (Signed)
Thyroid level was normal

## 2017-07-03 ENCOUNTER — Encounter: Payer: Self-pay | Admitting: Obstetrics & Gynecology

## 2017-07-03 ENCOUNTER — Ambulatory Visit (INDEPENDENT_AMBULATORY_CARE_PROVIDER_SITE_OTHER): Payer: Medicare Other | Admitting: Obstetrics & Gynecology

## 2017-07-03 DIAGNOSIS — N951 Menopausal and female climacteric states: Secondary | ICD-10-CM | POA: Diagnosis not present

## 2017-07-03 MED ORDER — PROGESTERONE MICRONIZED 100 MG PO CAPS: 100.0000 mg | ORAL_CAPSULE | Freq: Every day | ORAL | 4 refills | Status: DC

## 2017-07-03 NOTE — Patient Instructions (Signed)
1. Menopause syndrome Severe persistent hot flushes and night sweats off Estradiol PO therapy x 02/2017.  Very significant risk factors for Strokes and >10 yrs of HRT increasing her risk of Breast Ca.  Strongly recommend screening Mammo now.  On Estring/Prozac.  Thyroid meds recently adjusted 05/2017.  Will try Prometrium 100 mg HS.  Will go to [facility] for Phytoestrogens.  Recommend Soy products in nutrition.  [name], it was a pleasure to meet you today!  I will see you again at your Annual/Gyn visit in 02/2018.  Menopause and Herbal Products What is menopause? Menopause is the normal time of life when menstrual periods decrease in frequency and eventually stop completely. This process can take several years for some women. Menopause is complete when you have had an absence of menstruation for a full year since your last menstrual period. It usually occurs between the ages of 62 and 75. It is not common for menopause to begin before the age of 49. During menopause, your body stops producing the female hormones estrogen and progesterone. Common symptoms associated with this loss of hormones (vasomotor symptoms) are:  Hot flashes.  Hot flushes.  Night sweats.  Other common symptoms and complications of menopause include:  Decrease in sex drive.  Vaginal dryness and thinning of the walls of the vagina. This can make sex painful.  Dryness of the skin and development of wrinkles.  Headaches.  Tiredness.  Irritability.  Memory problems.  Weight gain.  Bladder infections.  Hair growth on the face and chest.  Inability to reproduce offspring (infertility).  Loss of density in the bones (osteoporosis) increasing your risk for breaks (fractures).  Depression.  Hardening and narrowing of the arteries (atherosclerosis). This increases your risk of heart attack and stroke.  What treatment options are available? There are many treatment choices for menopause symptoms. The  most common treatment is hormone replacement therapy. Many alternative therapies for menopause are emerging, including the use of herbal products. These supplements can be found in the form of herbs, teas, oils, tinctures, and pills. Common herbal supplements for menopause are made from plants that contain phytoestrogens. Phytoestrogens are compounds that occur naturally in plants and plant products. They act like estrogen in the body. Foods and herbs that contain phytoestrogens include:  Soy.  Flax seeds.  Red clover.  Ginseng.  What menopause symptoms may be helped if I use herbal products?  Vasomotor symptoms. These may be helped by: ? Soy. Some studies show that soy may have a moderate benefit for hot flashes. ? Black cohosh. There is limited evidence indicating this may be beneficial for hot flashes.  Symptoms that are related to heart and blood vessel disease. These may be helped by soy. Studies have shown that soy can help to lower cholesterol.  Depression. This may be helped by: ? St. John's wort. There is limited evidence that shows this may help mild to moderate depression. ? Black cohosh. There is evidence that this may help depression and mood swings.  Osteoporosis. Soy may help to decrease bone loss that is associated with menopause and may prevent osteoporosis. Limited evidence indicates that red clover may offer some bone loss protection as well. Other herbal products that are commonly used during menopause lack enough evidence to support their use as a replacement for conventional menopause therapies. These products include evening primrose, ginseng, and red clover. What are the cases when herbal products should not be used during menopause? Do not use herbal products during menopause without  your health care provider's approval if:  You are taking medicine.  You have a preexisting liver condition.  Are there any risks in my taking herbal products during menopause? If  you choose to use herbal products to help with symptoms of menopause, keep in mind that:  Different supplements have different and unmeasured amounts of herbal ingredients.  Herbal products are not regulated the same way that medicines are.  Concentrations of herbs may vary depending on the way they are prepared. For example, the concentration may be different in a pill, tea, oil, and tincture.  Little is known about the risks of using herbal products, particularly the risks of long-term use.  Some herbal supplements can be harmful when combined with certain medicines.  Most commonly reported side effects of herbal products are mild. However, if used improperly, many herbal supplements can cause serious problems. Talk to your health care provider before starting any herbal product. If problems develop, stop taking the supplement and let your health care provider know. This information is not intended to replace advice given to you by your health care provider. Make sure you discuss any questions you have with your health care provider. Document Released: 03/11/2008 Document Revised: 08/20/2016 Document Reviewed: 03/08/2014 Elsevier Interactive Patient Education  2017 [employer].

## 2017-07-03 NOTE — Progress Notes (Signed)
    [name] 21-May-1951 [id number]        66 y.o.  G3P3   RP:  Continued Vasomotor symptoms off HRT  HPI:  66 yo with Coronary Atherosclerosis and multiple medical risk factors including DM/Hyperlipidemia/cHTN/Obesity.  S/P Hysterectomy.  Was on Estradiol 1 mg daily for many years, about 10 yrs and had been increased to 2 mg daily x 1 year because of continued hot flashes and night sweats.  Stopped HRT in 02/2017 on the recommendation of Dr [name].  Was started on Estring/recommended PhytoEstrogens in diet and Peppermint oil behind the ears.  Still experiencing severe hot flashes interfering with all daily activities and night sweats every night preventing her to sleep.  Screening Mammo strongly recommended, but still not done (Last one in 2011).  On Prozac for Depression.  Hypothyroidism with recent increase 05/2017 in her Synthroid dosage.  Past medical history,surgical history, problem list, medications, allergies, family history and social history were all reviewed and documented in the EPIC chart.  Directed ROS with pertinent positives and negatives documented in the history of present illness/assessment and plan.  Exam:  There were no vitals filed for this visit. General appearance:  Normal  Annual/Gyn exam done with Dr [name] 02/2017  Assessment/Plan:  66 y.o. G3P3   1. Menopause syndrome Severe persistent hot flushes and night sweats off Estradiol PO therapy x 02/2017.  Very significant risk factors for Strokes and >10 yrs of HRT increasing her risk of Breast Ca.  Strongly recommend screening Mammo now.  On Estring/Prozac.  Thyroid meds recently adjusted 05/2017.  Will try Prometrium 100 mg HS.  Will go to [facility] for Phytoestrogens.  Recommend Soy products in nutrition.  Counseling on above issues >50% x 25 minutes.  Sherry Bruins MD, 12:44 PM 07/03/2017

## 2017-07-08 ENCOUNTER — Other Ambulatory Visit: Payer: Self-pay | Admitting: Cardiology

## 2017-07-08 DIAGNOSIS — I25118 Atherosclerotic heart disease of native coronary artery with other forms of angina pectoris: Secondary | ICD-10-CM | POA: Diagnosis not present

## 2017-07-08 DIAGNOSIS — M1990 Unspecified osteoarthritis, unspecified site: Secondary | ICD-10-CM | POA: Diagnosis not present

## 2017-07-08 DIAGNOSIS — M5416 Radiculopathy, lumbar region: Secondary | ICD-10-CM | POA: Diagnosis not present

## 2017-07-08 DIAGNOSIS — G894 Chronic pain syndrome: Secondary | ICD-10-CM | POA: Diagnosis not present

## 2017-07-08 DIAGNOSIS — I129 Hypertensive chronic kidney disease with stage 1 through stage 4 chronic kidney disease, or unspecified chronic kidney disease: Secondary | ICD-10-CM | POA: Diagnosis not present

## 2017-07-08 DIAGNOSIS — E1121 Type 2 diabetes mellitus with diabetic nephropathy: Secondary | ICD-10-CM | POA: Diagnosis not present

## 2017-07-08 DIAGNOSIS — E785 Hyperlipidemia, unspecified: Secondary | ICD-10-CM | POA: Diagnosis not present

## 2017-07-08 DIAGNOSIS — R079 Chest pain, unspecified: Secondary | ICD-10-CM

## 2017-07-08 DIAGNOSIS — Z01810 Encounter for preprocedural cardiovascular examination: Secondary | ICD-10-CM | POA: Diagnosis not present

## 2017-07-08 DIAGNOSIS — N189 Chronic kidney disease, unspecified: Secondary | ICD-10-CM | POA: Diagnosis not present

## 2017-07-15 DIAGNOSIS — M48062 Spinal stenosis, lumbar region with neurogenic claudication: Secondary | ICD-10-CM | POA: Diagnosis not present

## 2017-07-16 ENCOUNTER — Ambulatory Visit (INDEPENDENT_AMBULATORY_CARE_PROVIDER_SITE_OTHER): Payer: Medicare Other

## 2017-07-16 ENCOUNTER — Ambulatory Visit (INDEPENDENT_AMBULATORY_CARE_PROVIDER_SITE_OTHER): Payer: Medicare Other | Admitting: Podiatry

## 2017-07-16 ENCOUNTER — Encounter: Payer: Self-pay | Admitting: Podiatry

## 2017-07-16 ENCOUNTER — Other Ambulatory Visit: Payer: Self-pay | Admitting: Podiatry

## 2017-07-16 DIAGNOSIS — M779 Enthesopathy, unspecified: Secondary | ICD-10-CM

## 2017-07-16 DIAGNOSIS — S99922A Unspecified injury of left foot, initial encounter: Secondary | ICD-10-CM

## 2017-07-16 DIAGNOSIS — M79675 Pain in left toe(s): Secondary | ICD-10-CM

## 2017-07-16 MED ORDER — TRIAMCINOLONE ACETONIDE 10 MG/ML IJ SUSP
10.0000 mg | Freq: Once | INTRAMUSCULAR | Status: AC
  Administered 2017-07-16: 10 mg

## 2017-07-17 NOTE — Progress Notes (Signed)
Subjective:    Patient ID: [name], female   DOB: 66 y.o.   MRN: [id number]   HPI patient presents with caregiver with a lot of discomfort in the left big toe and states it doesn't appear to be the nail but the joint seems to be sore and I don't know what I may have done. Been present for about 4 months and last A1c was 8.2 and patient does not smoke currently    Review of Systems  All other systems reviewed and are negative.       Objective:  Physical Exam  Constitutional: She appears well-developed and well-nourished.  Cardiovascular: Intact distal pulses.   Musculoskeletal: Normal range of motion.  Neurological: She is alert.  Skin: Skin is warm.  Nursing note and vitals reviewed.  neurovascular status intact muscle strength adequate range of motion within normal limits with patient noted to have quite a bit of inflammation of the interphalangeal joint left big toe and no crepitus within the toe joint is moved and no pain in the first MPJ. Found have good digital perfusion well oriented 3     Assessment:    Inflammatory capsulitis of the interphalangeal joint left big toe with pain     Plan:    H&P condition reviewed and careful injection of the interphalangeal joint administered 3 mg Kenalog 5 mill grams Xylocaine and applied padding to the toe and patient be seen back if symptoms persist  X-rays indicate no signs of arthritic joint or ostial lysis of the interphalangeal joint left hallux

## 2017-07-22 DIAGNOSIS — M48062 Spinal stenosis, lumbar region with neurogenic claudication: Secondary | ICD-10-CM | POA: Diagnosis not present

## 2017-07-23 ENCOUNTER — Encounter: Payer: Self-pay | Admitting: Pharmacist

## 2017-07-23 ENCOUNTER — Other Ambulatory Visit: Payer: Self-pay | Admitting: Pharmacy Technician

## 2017-07-23 ENCOUNTER — Ambulatory Visit (HOSPITAL_COMMUNITY)
Admission: RE | Admit: 2017-07-23 | Discharge: 2017-07-23 | Disposition: A | Discharge status: Home / Self Care | Payer: Medicare Other | Source: Ambulatory Visit | Attending: Cardiology | Admitting: Cardiology

## 2017-07-23 DIAGNOSIS — E1122 Type 2 diabetes mellitus with diabetic chronic kidney disease: Secondary | ICD-10-CM | POA: Diagnosis not present

## 2017-07-23 DIAGNOSIS — M5416 Radiculopathy, lumbar region: Secondary | ICD-10-CM | POA: Diagnosis not present

## 2017-07-23 DIAGNOSIS — R079 Chest pain, unspecified: Secondary | ICD-10-CM | POA: Diagnosis not present

## 2017-07-23 DIAGNOSIS — E785 Hyperlipidemia, unspecified: Secondary | ICD-10-CM | POA: Diagnosis not present

## 2017-07-23 DIAGNOSIS — N189 Chronic kidney disease, unspecified: Secondary | ICD-10-CM | POA: Diagnosis not present

## 2017-07-23 DIAGNOSIS — G894 Chronic pain syndrome: Secondary | ICD-10-CM | POA: Diagnosis not present

## 2017-07-23 DIAGNOSIS — I25118 Atherosclerotic heart disease of native coronary artery with other forms of angina pectoris: Secondary | ICD-10-CM | POA: Diagnosis not present

## 2017-07-23 DIAGNOSIS — E1140 Type 2 diabetes mellitus with diabetic neuropathy, unspecified: Secondary | ICD-10-CM | POA: Diagnosis not present

## 2017-07-23 DIAGNOSIS — Z01810 Encounter for preprocedural cardiovascular examination: Secondary | ICD-10-CM | POA: Diagnosis not present

## 2017-07-23 DIAGNOSIS — I129 Hypertensive chronic kidney disease with stage 1 through stage 4 chronic kidney disease, or unspecified chronic kidney disease: Secondary | ICD-10-CM | POA: Diagnosis not present

## 2017-07-23 DIAGNOSIS — M1990 Unspecified osteoarthritis, unspecified site: Secondary | ICD-10-CM | POA: Diagnosis not present

## 2017-07-23 MED ORDER — TECHNETIUM TC 99M TETROFOSMIN IV KIT
10.0000 | PACK | Freq: Once | INTRAVENOUS | Status: AC | PRN
  Administered 2017-07-23: 10 via INTRAVENOUS

## 2017-07-23 MED ORDER — TECHNETIUM TC 99M TETROFOSMIN IV KIT
30.0000 | PACK | Freq: Once | INTRAVENOUS | Status: AC | PRN
  Administered 2017-07-23: 30 via INTRAVENOUS

## 2017-07-23 MED ORDER — REGADENOSON 0.4 MG/5ML IV SOLN
0.4000 mg | Freq: Once | INTRAVENOUS | Status: AC
  Administered 2017-07-23: 0.4 mg via INTRAVENOUS

## 2017-07-23 MED ORDER — REGADENOSON 0.4 MG/5ML IV SOLN
INTRAVENOUS | Status: AC
  Administered 2017-07-23: 0.4 mg via INTRAVENOUS
  Filled 2017-07-23: qty 5

## 2017-07-23 NOTE — Patient Outreach (Signed)
[facility]) Care Management  07/23/2017  [name] Jan 22, 1951 [id number]   [name] returned my call in reference to patient assistance programs that she is interested in applying to. HIPAA identifiers were verified and verbal consent received. In my previous note I stated the medication was Lyrica, that was incorrect. The patient is in need of Elmiron ([employer]), Lantus ([employer]), and Novolog [employer]). The patient had previously spoken with Karrie Meres, R[place] and stated she had the application's but had not completed them. When I spoke with the patient today she states she never received them. I have reprinted the application's for the patient and I will fill out as much of the application that I can and then mail them out tomorrow for her completion.  I have informed the patient of the necessary documentation that will be needed to apply to the program's. I have requested for her to contact me once she has received the application's and obtained the documents so that I can further assist her with the process.  Doreene Burke, [place] [phone]

## 2017-07-23 NOTE — Patient Outreach (Signed)
[facility]) Care Management  07/23/2017  [name] 1951-04-07 [id number]  Attempted to contact patient again in reference to patient assistance application for Lyrica. Unfortunately she did not answer or return my previous call. I left a HIPAA compliant voicemail on her phone requesting for her to return my call.  Doreene Burke, [place] [phone]

## 2017-07-23 NOTE — Progress Notes (Signed)
EKG was normal. Dr Terrence Dupont notified, OK to let pt go home. Stress test complete; pt states her arm feels a little better. Pt needs back surgery soon and had difficulty lying on Nuc Med tables. Advised pt to take her BP meds when arrives home

## 2017-07-23 NOTE — Progress Notes (Signed)
Pt having stress tests pictures. C/O L arm pain- pressure and tingling fingers L hand. BP elevated, HR 80's, sats 98%. Dr Doylene Canard in room- advised RN to get a EKG nad report results to Dr Terrence Dupont.

## 2017-07-28 ENCOUNTER — Other Ambulatory Visit: Payer: Self-pay | Admitting: Orthopedic Surgery

## 2017-07-31 ENCOUNTER — Encounter: Payer: Self-pay | Admitting: Obstetrics & Gynecology

## 2017-07-31 ENCOUNTER — Ambulatory Visit (INDEPENDENT_AMBULATORY_CARE_PROVIDER_SITE_OTHER): Payer: Medicare Other | Admitting: Obstetrics & Gynecology

## 2017-07-31 ENCOUNTER — Other Ambulatory Visit: Payer: Self-pay | Admitting: Obstetrics & Gynecology

## 2017-07-31 DIAGNOSIS — Z90710 Acquired absence of both cervix and uterus: Secondary | ICD-10-CM | POA: Diagnosis not present

## 2017-07-31 DIAGNOSIS — N951 Menopausal and female climacteric states: Secondary | ICD-10-CM

## 2017-07-31 DIAGNOSIS — Z1231 Encounter for screening mammogram for malignant neoplasm of breast: Secondary | ICD-10-CM

## 2017-07-31 MED ORDER — ESTRADIOL 0.1 MG/24HR TD PTWK: 0.1000 mg | MEDICATED_PATCH | TRANSDERMAL | 4 refills | Status: DC

## 2017-07-31 NOTE — Progress Notes (Signed)
    JASA DUNDON Sep 16, 1951 [id number]        66 y.o.  G3P3 S/P Hysterectomy  RP:  Severe hot flushes/night sweats  HPI:  Hot flushes/night sweats in spite of Prometrium 100 mg HS, Black Cohosh and Anti-Depressants.  No pelvic pain.  Breasts wnl.  No recent Mammo, last was 2011.  Past medical history,surgical history, problem list, medications, allergies, family history and social history were all reviewed and documented in the EPIC chart.  Directed ROS with pertinent positives and negatives documented in the history of present illness/assessment and plan.  Exam:  There were no vitals filed for this visit. General appearance:  Normal  Deferred   Assessment/Plan:  66 y.o. G3P3   1. Menopause syndrome Decision to restart on HRT because of severe Vasomotor Menopausal Sxs.  Patient is well aware of her risks of Stroke/Blood clots and Breast Ca.  Scheduling a screening Mammo now.  Start on Estradiol patch 0.1.  Will continue Prometrium 100 mg HS for now, but will try without when runs out of it (s/p Hysterectomy).  Continue Vit D supplement, Ca++ in food and weight bearing physical activity.  2. H/O total hysterectomy  Counseling on above issues >50% x 25 minutes  [name] MD, 12:16 PM 07/31/2017

## 2017-08-02 NOTE — Patient Instructions (Signed)
1. Menopause syndrome Decision to restart on HRT because of severe Vasomotor Menopausal Sxs.  Patient is well aware of her risks of Stroke/Blood clots and Breast Ca.  Scheduling a screening Mammo now.  Start on Estradiol patch 0.1.  Will continue Prometrium 100 mg HS for now, but will try without when runs out of it (s/p Hysterectomy).  Continue Vit D supplement, Ca++ in food and weight bearing physical activity.    2. H/O total hysterectomy   Kerilyn, it was a pleasure seeing you today!     Menopause and Hormone Replacement Therapy What is hormone replacement therapy? Hormone replacement therapy (HRT) is the use of artificial (synthetic) hormones to replace hormones that your body stops producing during menopause. Menopause is the normal time of life when menstrual periods stop completely and the ovaries stop producing the female hormones estrogen and progesterone. This lack of hormones can affect your health and cause undesirable symptoms. HRT can relieve some of those symptoms. What are my options for HRT? HRT may consist of the synthetic hormones estrogen and progestin, or it may consist of only estrogen (estrogen-only therapy). You and your health care provider will decide which form of HRT is best for you. If you choose to be on HRT and you have a uterus, estrogen and progestin are usually prescribed. Estrogen-only therapy is used for women who do not have a uterus. Possible options for taking HRT include:  Pills.  Patches.  Gels.  Sprays.  Vaginal cream.  Vaginal rings.  Vaginal inserts.  The amount of hormone(s) that you take and how long you take the hormone(s) varies depending on your individual health. It is important to:  Begin HRT with the lowest possible dosage.  Stop HRT as soon as your health care provider tells you to stop.  Work with your health care provider so that you feel informed and comfortable with your decisions.  What are the benefits of HRT? HRT can  reduce the frequency and severity of menopausal symptoms. Benefits of HRT vary depending on the menopausal symptoms that you have, the severity of your symptoms, and your overall health. HRT may help to improve the following menopausal symptoms:  Hot flashes and night sweats. These are sudden feelings of heat that spread over the face and body. The skin may turn red, like a blush. Night sweats are hot flashes that happen while you are sleeping or trying to sleep.  Bone loss (osteoporosis). The body loses calcium more quickly after menopause, causing the bones to become weaker. This can increase the risk for bone breaks (fractures).  Vaginal dryness. The lining of the vagina can become thin and dry, which can cause pain during sexual intercourse or cause infection, burning, or itching.  Urinary tract infections.  Urinary incontinence. This is a decreased ability to control when you urinate.  Irritability.  Short-term memory problems.  What are the risks of HRT? Risks of HRT vary depending on your individual health and medical history. Risks of HRT also depend on whether you receive both estrogen and progestin or you receive estrogen only.HRT may increase the risk of:  Spotting. This is when a small amount of bloodleaks from the vagina unexpectedly.  Endometrial cancer. This cancer is in the lining of the uterus (endometrium).  Breast cancer.  Increased density of breast tissue. This can make it harder to find breast cancer on a breast X-ray (mammogram).  Stroke.  Heart attack.  Blood clots.  Gallbladder disease.  Risks of HRT can increase  if you have any of the following conditions:  Endometrial cancer.  Liver disease.  Heart disease.  Breast cancer.  History of blood clots.  History of stroke.  How should I care for myself while I am on HRT?  Take over-the-counter and prescription medicines only as told by your health care provider.  Get mammograms, pelvic  exams, and medical checkups as often as told by your health care provider.  Have Pap tests done as often as told by your health care provider. A Pap test is sometimes called a Pap smear. It is a screening test that is used to check for signs of cancer of the cervix and vagina. A Pap test can also identify the presence of infection or precancerous changes. Pap tests may be done: ? Every 3 years, starting at age 61. ? Every 5 years, starting after age 41, in combination with testing for human papillomavirus (HPV). ? More often or less often depending on other medical conditions you have, your age, and other risk factors.  It is your responsibility to get your Pap test results. Ask your health care provider or the department performing the test when your results will be ready.  Keep all follow-up visits as told by your health care provider. This is important. When should I seek medical care? Talk with your health care provider if:  You have any of these: ? Pain or swelling in your legs. ? Shortness of breath. ? Chest pain. ? Lumps or changes in your breasts or armpits. ? Slurred speech. ? Pain, burning, or bleeding when you urine.  You develop any of these: ? Unusual vaginal bleeding. ? Dizziness or headaches. ? Weakness or numbness in any part of your arms or legs. ? Pain in your abdomen.  This information is not intended to replace advice given to you by your health care provider. Make sure you discuss any questions you have with your health care provider. Document Released: 06/22/2003 Document Revised: 08/20/2016 Document Reviewed: 03/27/2015 Elsevier Interactive Patient Education  2017 [employer].

## 2017-08-06 ENCOUNTER — Ambulatory Visit (INDEPENDENT_AMBULATORY_CARE_PROVIDER_SITE_OTHER): Payer: Medicare Other | Admitting: Endocrinology

## 2017-08-06 ENCOUNTER — Encounter: Payer: Self-pay | Admitting: Endocrinology

## 2017-08-06 VITALS — BP 136/82 | HR 97 | Ht 66.0 in | Wt 278.0 lb

## 2017-08-06 DIAGNOSIS — E782 Mixed hyperlipidemia: Secondary | ICD-10-CM

## 2017-08-06 DIAGNOSIS — E1165 Type 2 diabetes mellitus with hyperglycemia: Secondary | ICD-10-CM

## 2017-08-06 DIAGNOSIS — E063 Autoimmune thyroiditis: Secondary | ICD-10-CM

## 2017-08-06 DIAGNOSIS — I10 Essential (primary) hypertension: Secondary | ICD-10-CM

## 2017-08-06 DIAGNOSIS — Z794 Long term (current) use of insulin: Secondary | ICD-10-CM

## 2017-08-06 MED ORDER — INSULIN GLARGINE 300 UNIT/ML ~~LOC~~ SOPN: 30.0000 [IU] | PEN_INJECTOR | Freq: Every day | SUBCUTANEOUS | 3 refills | Status: DC

## 2017-08-06 NOTE — Pre-Procedure Instructions (Signed)
[name]  08/06/2017      [id number] - [address], [place] [address] Phone: [phone] Fax: [phone]    Your procedure is scheduled on Wednesday, August 13, 2017  Report to [facility] Admitting Entrance "A" at 6:30A.M.   Call this number if you have problems the morning of surgery:  [phone]   Remember:  Do not eat food or drink liquids after midnight.  Take these medicines the morning of surgery with A SIP OF WATER: CloNIDine (CATAPRES), FLUoxetine (PROZAC), Gabapentin (NEURONTIN), LevETIRAcetam (KEPPRA), Levothyroxine (SYNTHROID, LEVOTHROID), and Trimethoprim (TRIMPEX). If needed Ondansetron [facility]) for nausea and  HYDROcodone-acetaminophen ([place]) for pain.  Follow your doctor's instruction regarding Aspirin.  As of today, stop taking all Aspirin products, Vitamins, Fish oils, and Herbal medications. Also stop all NSAIDS i.e. Advil, Ibuprofen, Motrin, Aleve, Anaprox, Naproxen, BC and Goody Powders. That also includes: ELMIRON, Ferrous sulfate, and Meloxicam (MOBIC).  How to Manage Your Diabetes Before and After Surgery  Why is it important to control my blood sugar before and after surgery? . Improving blood sugar levels before and after surgery helps healing and can limit problems. . A way of improving blood sugar control is eating a healthy diet by: o  Eating less sugar and carbohydrates o  Increasing activity/exercise o  Talking with your doctor about reaching your blood sugar goals . High blood sugars (greater than 180 mg/dL) can raise your risk of infections and slow your recovery, so you will need to focus on controlling your diabetes during the weeks before surgery. . Make sure that the doctor who takes care of your diabetes knows about your planned surgery including the date and location.  How do I manage my blood sugar before  surgery? . Check your blood sugar at least 4 times a day, starting 2 days before surgery, to make sure that the level is not too high or low. o Check your blood sugar the morning of your surgery when you wake up and every 2 hours until you get to the Short Stay unit. . If your blood sugar is less than 70 mg/dL, you will need to treat for low blood sugar: o Do not take insulin. o Treat a low blood sugar (less than 70 mg/dL) with  cup of clear juice (cranberry or apple), 4 glucose tablets, OR glucose gel. Recheck blood sugar in 15 minutes after treatment (to make sure it is greater than 70 mg/dL). If your blood sugar is not greater than 70 mg/dL on recheck, call [phone] o  for further instructions. . Report your blood sugar to the short stay nurse when you get to Short Stay. . If you are admitted to the hospital after surgery: o Your blood sugar will be checked by the staff and you will probably be given insulin after surgery (instead of oral diabetes medicines) to make sure you have good blood sugar levels. o The goal for blood sugar control after surgery is 80-180 mg/dL.  WHAT DO I DO ABOUT MY DIABETES MEDICATION?  . THE NIGHT BEFORE SURGERY, take __15 units if BS is less than 300 or 17 units if the BS is greater than 300_________ units of ___(LANTUS)________insulin.  . THE DAY OF SURGERY, do not take Insulin aspart (NOVOLOG FLEXPEN) and Insulin Glargine (TOUJEO SOLOSTAR)  . If your CBG is greater than 220 mg/dL, call us at  [phone]   Do not wear jewelry, make-up or nail polish.  Do not wear lotions, powders, perfumes, or deodorant.  Do not shave 48 hours prior to surgery.    Do not bring valuables to the hospital.  [facility] is not responsible for any belongings or valuables.  Contacts, dentures or bridgework may not be worn into surgery.  Leave your suitcase in the car.  After surgery it may be brought to your room.  For patients admitted to the hospital, discharge time  will be determined by your treatment team.  Patients discharged the day of surgery will not be allowed to drive home.   Special instructions:   [facility]- Preparing For Surgery  Before surgery, you can play an important role. Because skin is not sterile, your skin needs to be as free of germs as possible. You can reduce the number of germs on your skin by washing with CHG (chlorahexidine gluconate) Soap before surgery.  CHG is an antiseptic cleaner which kills germs and bonds with the skin to continue killing germs even after washing.  Please do not use if you have an allergy to CHG or antibacterial soaps. If your skin becomes reddened/irritated stop using the CHG.  Do not shave (including legs and underarms) for at least 48 hours prior to first CHG shower. It is OK to shave your face.  Please follow these instructions carefully.   1. Shower the NIGHT BEFORE SURGERY and the MORNING OF SURGERY with CHG.   2. If you chose to wash your hair, wash your hair first as usual with your normal shampoo.  3. After you shampoo, rinse your hair and body thoroughly to remove the shampoo.  4. Use CHG as you would any other liquid soap. You can apply CHG directly to the skin and wash gently with a scrungie or a clean washcloth.   5. Apply the CHG Soap to your body ONLY FROM THE NECK DOWN.  Do not use on open wounds or open sores. Avoid contact with your eyes, ears, mouth and genitals (private parts). Wash Face and genitals (private parts)  with your normal soap.  6. Wash thoroughly, paying special attention to the area where your surgery will be performed.  7. Thoroughly rinse your body with warm water from the neck down.  8. DO NOT shower/wash with your normal soap after using and rinsing off the CHG Soap.  9. Pat yourself dry with a CLEAN TOWEL.  10. Wear CLEAN PAJAMAS to bed the night before surgery, wear comfortable clothes the morning of surgery  11. Place CLEAN SHEETS on your bed the night  of your first shower and DO NOT SLEEP WITH PETS.  Day of Surgery: Do not apply any deodorants/lotions. Please wear clean clothes to the hospital/surgery center.    Please read over the following fact sheets that you were given. Pain Booklet, Coughing and Deep Breathing, Blood Transfusion Information, MRSA Information and Surgical Site Infection Prevention

## 2017-08-06 NOTE — Progress Notes (Signed)
[name] is a 66 y.o. female.    Reason for Appointment: Diabetes follow-up   History of Present Illness   Diagnosis: Type 2 DIABETES MELITUS, date of diagnosis: 2000  Previous history: She had initially been treated with metformin and Amaryl and subsequently given Victoza which helped overall control and weight loss. In 9/13 because of significant hyperglycemia she was started on basal bolus insulin also. Had required relatively small doses of insulin. Her blood sugar control has been excellent with A1c between 5.3-6.0 although this may be relatively higher than expected. Tends to have relatively higher readings after supper In 6/15 she was told to resume her Victoza and stop her Lantus since she had been gaining weight with Lantus and NovoLog regimen.   Recent history:     INSULIN: Novolog 15  units before meals; Lantus 30/35 units daily hs   Non-insulin hypoglycemic drugs:     Her last A1c was 8.2, has been as low as 6.9  Fructosamine is 360 last month    Current blood sugar patterns and management:  She was told to stop taking expired insulin on the last visit when she was taking family members NovoLog  Although she thinks she is taking insulin both NovoLog and Lantus that is not expired her blood sugars are fluctuating markedly  She is adjusting her bedtime Lantus based on how much she is eating or the blood sugar level instead of titrating to fasting  Again appetite is quite variable and sometimes made much less than expected  Also occasionally will potentially forget her NovoLog completely in the evening  Not clear which readings in the early afternoon are after eating but they are periodically over 200  Not able to do any exercise because of various issues and pain  She is not able to judge how much insulin to take for various meals     Side effects from medications:  none  Monitors blood glucose: Once a day or less on average .    Glucometer:   Accu-Chek compact.          Blood Glucose readings as   Mean values apply above for all meters except median for One Touch  PRE-MEAL Fasting Lunch Dinner Bedtime Overall  Glucose range: 70-3 79 129   1 20-283    Mean/median: 185    208  199    POST-MEAL PC Breakfast PC Lunch PC Dinner  Glucose range:   1 34-255    Mean/median:       Meals: 1-2 meals per day. Supper at 7 PM pm           Physical activity: exercise: Limited, has had fatigue, back and leg pain.           Dietician visit: Most recent: 05/1190           Complications: are: None  Wt Readings from Last 3 Encounters:  08/06/17 278 lb (126.1 kg)  06/25/17 275 lb 8 oz (125 kg)  05/16/17 269 lb 12.8 oz (122.4 kg)    LABS:  Lab Results  Component Value Date   HGBA1C 8.2 (H) 05/13/2017   HGBA1C 6.9 (H) 11/11/2016   HGBA1C 6.4 02/12/2016   Lab Results  Component Value Date   MICROALBUR 14.7 (H) 05/13/2017   LDLCALC 173 (H) 05/13/2017   CREATININE 1.99 (H) 05/13/2017    Other active problems: See review of systems   No visits with results within 1 Week(s) from this visit.  Latest  known visit with results is:  Lab on 06/24/2017  Component Date Value Ref Range Status  . Fructosamine 06/24/2017 360* 0 - 285 umol/L Final   Comment: Published reference interval for apparently healthy subjects between age 3 and 71 is 11 - 285 umol/L and in a poorly controlled diabetic population is 228 - 563 umol/L with a mean of 396 umol/L.   Marland Kitchen TSH 06/24/2017 1.510  0.450 - 4.500 uIU/mL Final  . specimen status report 06/24/2017 Comment   Final   Comment: Written Authorization Written Authorization Written Authorization Received. Authorization received from [place] 06-27-2017 Logged by Vista Lawman     Allergies as of 08/06/2017      Reactions   Hydrocodone Other (See Comments)   Tachycardia   Ciprofloxacin Other (See Comments)   Dizziness   Penicillins Hives   Has patient had a PCN reaction causing  immediate rash, facial/tongue/throat swelling, SOB or lightheadedness with hypotension: No Has patient had a PCN reaction causing severe rash involving mucus membranes or skin necrosis: No Has patient had a PCN reaction that required hospitalization No Has patient had a PCN reaction occurring within the last 10 years: No If all of the above answers are "NO", then may proceed with Cephalosporin use.      Medication List       Accurate as of 08/06/17 11:59 PM. Always use your most recent med list.          amitriptyline 10 MG tablet Commonly known as:  ELAVIL Take 10 mg by mouth at bedtime.   amLODipine-valsartan 10-320 MG tablet Commonly known as:  EXFORGE Take 1 tablet by mouth daily. Annual appt due in Sept must see provider for future refills   aspirin 81 MG EC tablet Take 81 mg by mouth every evening.   atorvastatin 20 MG tablet Commonly known as:  LIPITOR Take 1 tablet (20 mg total) by mouth daily.   cloNIDine 0.1 MG tablet Commonly known as:  CATAPRES TAKE ONE TABLET BY MOUTH TWICE A DAY   ELMIRON 100 MG capsule Generic drug:  pentosan polysulfate Take 100 mg by mouth 2 (two) times daily.   estradiol 0.1 mg/24hr patch Commonly known as:  CLIMARA - Dosed in mg/24 hr Place 1 patch (0.1 mg total) onto the skin once a week.   ferrous sulfate 325 (65 FE) MG tablet Take 325 mg by mouth daily with breakfast.   FLUoxetine 20 MG capsule Commonly known as:  PROZAC TAKE ONE CAPSULE BY MOUTH DAILY   gabapentin 600 MG tablet Commonly known as:  NEURONTIN Take 600 mg by mouth 2 (two) times daily.   glucose blood test strip Commonly known as:  ACCU-CHEK AVIVA PLUS Use to test blood sugar 3 times daily- Dx code E11.9   HYDROcodone-acetaminophen 10-325 MG tablet Commonly known as:  NORCO Take 1 tablet by mouth every 6 (six) hours as needed for moderate pain.   insulin glargine 100 UNIT/ML injection Commonly known as:  LANTUS Inject 30-35 Units into the skin at  bedtime. Takes 30 units if glucose is <300 and 35 units if glucose is >300.   Insulin Glargine 300 UNIT/ML Sopn Commonly known as:  TOUJEO SOLOSTAR Inject 30 Units into the skin daily.   lansoprazole 15 MG capsule Commonly known as:  PREVACID Take 15 mg by mouth every evening.   levETIRAcetam 250 MG tablet Commonly known as:  KEPPRA Take 250-500 mg by mouth 2 (two) times daily. Dose depends on level of pain.  levothyroxine 100 MCG tablet Commonly known as:  SYNTHROID, LEVOTHROID Take 1 tablet (100 mcg total) by mouth daily.   meloxicam 15 MG tablet Commonly known as:  MOBIC Take 15 mg by mouth daily.   NOVOLOG FLEXPEN 100 UNIT/ML FlexPen Generic drug:  insulin aspart Inject 10 Units into the skin 3 (three) times daily with meals.   ondansetron 4 MG tablet Commonly known as:  ZOFRAN TAKE 1 TABLET BY MOUTH EVERY 6 HOURS AS NEEDED FOR NAUSEA   progesterone 100 MG capsule Commonly known as:  PROMETRIUM Take 1 capsule (100 mg total) by mouth at bedtime.   torsemide 20 MG tablet Commonly known as:  DEMADEX Take 20 mg by mouth daily.   trimethoprim 100 MG tablet Commonly known as:  TRIMPEX Take 100 mg by mouth every evening.   VICTOZA 18 MG/3ML Sopn Generic drug:  liraglutide Inject 0.2 mLs (1.2 mg total) into the skin daily. Inject once daily at the same time       Allergies:  Allergies  Allergen Reactions  . Hydrocodone Other (See Comments)    Tachycardia   . Ciprofloxacin Other (See Comments)    Dizziness   . Penicillins Hives    Has patient had a PCN reaction causing immediate rash, facial/tongue/throat swelling, SOB or lightheadedness with hypotension: No Has patient had a PCN reaction causing severe rash involving mucus membranes or skin necrosis: No Has patient had a PCN reaction that required hospitalization No Has patient had a PCN reaction occurring within the last 10 years: No If all of the above answers are "NO", then may proceed with Cephalosporin  use.     Past Medical History:  Diagnosis Date  . Anxiety   . Arthritis   . CERVICAL RADICULOPATHY, LEFT   . CKD (chronic kidney disease)   . COMMON MIGRAINE   . CORONARY ARTERY DISEASE   . Cystitis   . DEPRESSION   . Diabetes mellitus, type II ([place])   . DIVERTICULOSIS-COLON   . Gastric ulcer 04/2008  . Gastroparesis   . GERD (gastroesophageal reflux disease)   . Hiatal hernia   . Hyperlipidemia   . Hypertension   . Hypothyroidism   . INSOMNIA-SLEEP DISORDER-UNSPEC   . Iron deficiency anemia   . Wears glasses     Past Surgical History:  Procedure Laterality Date  . ABDOMINAL HYSTERECTOMY    . BACK SURGERY  03/2016  . CHOLECYSTECTOMY  06/2009  . COLONOSCOPY    . ESOPHAGOGASTRODUODENOSCOPY    . EYE SURGERY Bilateral    lasik  . Gastric Wedge resection lipoma  11/2007   x2 with laparotomy and gastrostomy  . Left knee replacement    . Rigth knee replacement with revision  04/2008   Dr. Berenice Primas  . ROTATOR CUFF REPAIR Left 01/2009  . s/p bladder surgury  09/2009   Dr. Terance Hart    Family History  Problem Relation Age of Onset  . Diabetes Sister        x 3  . Heart disease Sister        x2  . Diabetes Brother        x3  . Heart disease Brother        x2  . Coronary artery disease Other        female 1st degree  . Hypertension Other   . Colon cancer Neg Hx     Social History:  reports that she has quit smoking. She has never used smokeless tobacco. She reports that she  does not drink alcohol or use drugs.  Review of Systems:  Hypertension: Her blood pressure is treated with metoprolol, clonidine Twice a day and Exforge Followed by PCP  CKD: Her creatinine is moderately increased, needs regular follow-up   Lab Results  Component Value Date   CREATININE 1.99 (H) 05/13/2017   CREATININE 1.96 (H) 05/03/2017   CREATININE 3.86 (H) 05/02/2017   CREATININE 5.29 (H) 05/01/2017     Lipids: Followed by PCP, LDL has been inconsistently controlled,  on 20 mg  Zocor She is also on amlodipine Now taking Lipitor, not clear if she takes as regularly as her lipids are not consistently controlled  Lab Results  Component Value Date   CHOL 249 (H) 05/13/2017   HDL 52.30 05/13/2017   LDLCALC 173 (H) 05/13/2017   LDLDIRECT 156.6 01/07/2011   TRIG 123.0 05/13/2017   CHOLHDL 5 05/13/2017     Post ablative hypothyroidism:  Previously she had I-131 treatment on 08/20/11 for Graves' disease  Her TSH was finally normal in September Still compliant with her 100 g dose, this was increased this summer   Lab Results  Component Value Date   TSH 1.510 06/24/2017   TSH 12.63 (H) 05/13/2017   TSH 22.770 (H) 05/02/2017   FREET4 0.64 05/13/2017   FREET4 0.64 05/02/2017   FREET4 0.82 11/11/2016    On Gabapentin For leg pains, was told to have neuropathy by sports medicine specialist    Examination:   BP 136/82   Pulse 97   Ht 5\' 6"  (1.676 m)   Wt 278 lb (126.1 kg)   SpO2 98%   BMI 44.87 kg/m   Body mass index is 44.87 kg/m.     ASSESSMENT/ PLAN:   Diabetes type 2, uncontrolled :   See history of present illness for detailed discussion of his current management, blood sugar patterns and problems identified  Her blood sugars are Still fluctuating significantly and averaging about 200 at home with standard deviation 74 Blood sugars are variable at all different times This is likely to be from her variable fluid intake, erratic mealtimes and inconsistent dosing of insulin both basal and bolus Recommendations:  Discussed the actions of basal insulin and how to adjust it based on fasting blood sugar patterns  For better compliance he can take Lantus at the same time as her NovoLog in the evening  She can take her NovoLog right after eating if she can better estimate how much she has had  She probably needs to take at least and for most meals and 15-18 for meals with 2-3 servings of carbohydrate  Any high-fat intake at meals may need an  additional 5 units  She will check her blood sugars more consistently also after meals  She will also try Toujeo instead of Lantus, given co-pay card, discussed differences between this and Lantus and most likely will need at least 2-5 units more compared to Lantus  For now she can take 35 units of Lantus consistently at suppertime  Hypothyroidism:  Needs periodic monitoring, last TSH normal  LIPIDS: Needs follow-up levels on the next visit  HYPERTENSION: Controlled  Back pain: She is due to have surgery early next month   Patient Instructions  Lantus 35 at supper, adjust based on am sugar over 4-5 days  Novolog just after the meals      Labrittany Wechter 08/07/2017, 8:01 AM   Note: This office note was prepared with Estate agent. Any transcriptional errors that result from  this process are unintentional.

## 2017-08-06 NOTE — Patient Instructions (Signed)
Lantus 35 at supper, adjust based on am sugar over 4-5 days  Novolog just after the meals

## 2017-08-07 ENCOUNTER — Encounter (HOSPITAL_COMMUNITY)
Admission: RE | Admit: 2017-08-07 | Discharge: 2017-08-07 | Disposition: A | Discharge status: Home / Self Care | Payer: Medicare Other | Source: Ambulatory Visit | Attending: Orthopedic Surgery | Admitting: Orthopedic Surgery

## 2017-08-07 ENCOUNTER — Encounter (HOSPITAL_COMMUNITY): Payer: Self-pay

## 2017-08-07 DIAGNOSIS — F329 Major depressive disorder, single episode, unspecified: Secondary | ICD-10-CM | POA: Insufficient documentation

## 2017-08-07 DIAGNOSIS — N184 Chronic kidney disease, stage 4 (severe): Secondary | ICD-10-CM | POA: Diagnosis not present

## 2017-08-07 DIAGNOSIS — F419 Anxiety disorder, unspecified: Secondary | ICD-10-CM | POA: Diagnosis not present

## 2017-08-07 DIAGNOSIS — E1122 Type 2 diabetes mellitus with diabetic chronic kidney disease: Secondary | ICD-10-CM | POA: Insufficient documentation

## 2017-08-07 DIAGNOSIS — Z01818 Encounter for other preprocedural examination: Secondary | ICD-10-CM | POA: Diagnosis not present

## 2017-08-07 DIAGNOSIS — M79604 Pain in right leg: Secondary | ICD-10-CM | POA: Diagnosis not present

## 2017-08-07 DIAGNOSIS — Z79899 Other long term (current) drug therapy: Secondary | ICD-10-CM | POA: Diagnosis not present

## 2017-08-07 DIAGNOSIS — R918 Other nonspecific abnormal finding of lung field: Secondary | ICD-10-CM | POA: Diagnosis not present

## 2017-08-07 DIAGNOSIS — Z794 Long term (current) use of insulin: Secondary | ICD-10-CM | POA: Diagnosis not present

## 2017-08-07 DIAGNOSIS — K219 Gastro-esophageal reflux disease without esophagitis: Secondary | ICD-10-CM | POA: Insufficient documentation

## 2017-08-07 DIAGNOSIS — I129 Hypertensive chronic kidney disease with stage 1 through stage 4 chronic kidney disease, or unspecified chronic kidney disease: Secondary | ICD-10-CM | POA: Diagnosis not present

## 2017-08-07 DIAGNOSIS — M79605 Pain in left leg: Secondary | ICD-10-CM | POA: Insufficient documentation

## 2017-08-07 DIAGNOSIS — Z7982 Long term (current) use of aspirin: Secondary | ICD-10-CM | POA: Diagnosis not present

## 2017-08-07 DIAGNOSIS — I2510 Atherosclerotic heart disease of native coronary artery without angina pectoris: Secondary | ICD-10-CM | POA: Insufficient documentation

## 2017-08-07 DIAGNOSIS — E785 Hyperlipidemia, unspecified: Secondary | ICD-10-CM | POA: Diagnosis not present

## 2017-08-07 HISTORY — DX: Disease of blood and blood-forming organs, unspecified: D75.9

## 2017-08-07 HISTORY — DX: Cough: R05

## 2017-08-07 HISTORY — DX: Cough, unspecified: R05.9

## 2017-08-07 LAB — SURGICAL PCR SCREEN
MRSA, PCR: NEGATIVE
Staphylococcus aureus: NEGATIVE

## 2017-08-07 LAB — CBC WITH DIFFERENTIAL/PLATELET
Basophils Absolute: 0 10*3/uL (ref 0.0–0.1)
Basophils Relative: 0 %
Eosinophils Absolute: 0.3 10*3/uL (ref 0.0–0.7)
Eosinophils Relative: 5 %
HCT: 34 % — ABNORMAL LOW (ref 36.0–46.0)
Hemoglobin: 11 g/dL — ABNORMAL LOW (ref 12.0–15.0)
Lymphocytes Relative: 32 %
Lymphs Abs: 2.4 10*3/uL (ref 0.7–4.0)
MCH: 31.3 pg (ref 26.0–34.0)
MCHC: 32.4 g/dL (ref 30.0–36.0)
MCV: 96.6 fL (ref 78.0–100.0)
Monocytes Absolute: 0.3 10*3/uL (ref 0.1–1.0)
Monocytes Relative: 4 %
Neutro Abs: 4.5 10*3/uL (ref 1.7–7.7)
Neutrophils Relative %: 59 %
Platelets: 270 10*3/uL (ref 150–400)
RBC: 3.52 MIL/uL — ABNORMAL LOW (ref 3.87–5.11)
RDW: 13 % (ref 11.5–15.5)
WBC: 7.5 10*3/uL (ref 4.0–10.5)

## 2017-08-07 LAB — URINALYSIS, ROUTINE W REFLEX MICROSCOPIC
Glucose, UA: NEGATIVE mg/dL
Hgb urine dipstick: NEGATIVE
Ketones, ur: NEGATIVE mg/dL
Leukocytes, UA: NEGATIVE
Nitrite: NEGATIVE
Protein, ur: NEGATIVE mg/dL
Specific Gravity, Urine: 1.031 — ABNORMAL HIGH (ref 1.005–1.030)
pH: 5 (ref 5.0–8.0)

## 2017-08-07 LAB — COMPREHENSIVE METABOLIC PANEL
ALT: 18 U/L (ref 14–54)
AST: 16 U/L (ref 15–41)
Albumin: 3.6 g/dL (ref 3.5–5.0)
Alkaline Phosphatase: 108 U/L (ref 38–126)
Anion gap: 7 (ref 5–15)
BUN: 22 mg/dL — ABNORMAL HIGH (ref 6–20)
CO2: 24 mmol/L (ref 22–32)
Calcium: 9.5 mg/dL (ref 8.9–10.3)
Chloride: 108 mmol/L (ref 101–111)
Creatinine, Ser: 1.39 mg/dL — ABNORMAL HIGH (ref 0.44–1.00)
GFR calc Af Amer: 44 mL/min — ABNORMAL LOW (ref >60)
GFR calc non Af Amer: 38 mL/min — ABNORMAL LOW (ref >60)
Glucose, Bld: 211 mg/dL — ABNORMAL HIGH (ref 65–99)
Potassium: 4.4 mmol/L (ref 3.5–5.1)
Sodium: 139 mmol/L (ref 135–145)
Total Bilirubin: 0.9 mg/dL (ref 0.3–1.2)
Total Protein: 7.3 g/dL (ref 6.5–8.1)

## 2017-08-07 LAB — HEMOGLOBIN A1C
Hgb A1c MFr Bld: 7.9 % — ABNORMAL HIGH (ref 4.8–5.6)
Mean Plasma Glucose: 180.03 mg/dL

## 2017-08-07 LAB — GLUCOSE, CAPILLARY: Glucose-Capillary: 200 mg/dL — ABNORMAL HIGH (ref 65–99)

## 2017-08-07 LAB — APTT: aPTT: 35 seconds (ref 24–36)

## 2017-08-07 LAB — PROTIME-INR
INR: 1.21
Prothrombin Time: 15.2 seconds (ref 11.4–15.2)

## 2017-08-07 NOTE — Progress Notes (Signed)
   08/07/17 1335  OBSTRUCTIVE SLEEP APNEA  Have you ever been diagnosed with sleep apnea through a sleep study? No  Do you snore loudly (loud enough to be heard through closed doors)?  1  Do you often feel tired, fatigued, or sleepy during the daytime (such as falling asleep during driving or talking to someone)? 0  Has anyone observed you stop breathing during your sleep? 1  Do you have, or are you being treated for high blood pressure? 1  BMI more than 35 kg/m2? 1  Age > 50 (1-yes) 1  Neck circumference greater than:Female 16 inches or larger, Female 17inches or larger? 1  Female Gender (Yes=1) 0  Obstructive Sleep Apnea Score 6  Score 5 or greater  Results sent to PCP     08/07/17 1335  OBSTRUCTIVE SLEEP APNEA  Have you ever been diagnosed with sleep apnea through a sleep study? No  Do you snore loudly (loud enough to be heard through closed doors)?  1  Do you often feel tired, fatigued, or sleepy during the daytime (such as falling asleep during driving or talking to someone)? 0  Has anyone observed you stop breathing during your sleep? 1  Do you have, or are you being treated for high blood pressure? 1  BMI more than 35 kg/m2? 1  Age > 50 (1-yes) 1  Neck circumference greater than:Female 16 inches or larger, Female 17inches or larger? 1  Female Gender (Yes=1) 0  Obstructive Sleep Apnea Score 6  Score 5 or greater  Results sent to PCP

## 2017-08-07 NOTE — Progress Notes (Addendum)
SPOKE WITH ALLISON ZELENAK RE: [facility] AND SHE STATED PATIENT LAST DOSE SHOULD BE 08/11/17.  PATIENT STATED SHE ALREADY STOPPED ASPIRIN - LAST DOSE 08/06/17.  PATIENT STATES SHE SEES DR. HARWANI WHO DID HEART CATH YEARS AGO.  STRESS TEST WAS 07/23/17.

## 2017-08-08 ENCOUNTER — Telehealth: Payer: Self-pay | Admitting: Internal Medicine

## 2017-08-08 DIAGNOSIS — R069 Unspecified abnormalities of breathing: Secondary | ICD-10-CM

## 2017-08-08 NOTE — Telephone Encounter (Signed)
Ok, for pulm referral  - done

## 2017-08-08 NOTE — Progress Notes (Addendum)
Anesthesia Chart Review: Patient is a 66 year old female scheduled for left sided L3-4 lateral interbody fusion with instrumentation and allograft on 08/13/17 and L3-4, L4-5 posterior spinal fusion with instrumentation and allograft on 08/14/17 by Dr. Phylliss Bob.  History includes former smoker, migraines, diverticulosis, CAD, HTN, HLD, DM2, gastroparesis, CKD stage III, hiatal hernia, hypothyroidism, insomnia, iron deficiency anemia, anxiety, depression, hysterectomy, cholecystectomy, bilateral TKA, L4-5 transforaminal lumbar fusion 04/03/16. BMI is consistent with morbid obesity. OSA screening score.   PCP is Dr. Cathlean Cower, last visit 05/14/17. (On 08/08/17, Dr. Jenny Reichmann place a referral to pulmonology, I believe based off of elevated OSA screening score, but I will send him a staff message to clarify.) Nephrologist is Dr. Izell [place] with [facility].  Endocrinologist is Dr. Elayne Snare, last visit 08/06/17. Cardiologist is Dr. Terrence Dupont, last visit 02/12/16. Records pending, but with recent non-ischemic stress test, EF 69%.   Meds include amitriptyline, amlodipine-valsartan, aspirin 81 mg (on hold), Lipitor, clonidine, Elmiron (hold 24 hours before surgery), estradiol patch, ferrous sulfate, Prozac, Neurontin (on hold), Norco, Lantus, Prevacid, Keppra, Toujeo. Levothyroxine, progesterone, torsemide, trimethoprim.  BP 127/69   Pulse 81   Temp 36.7 C (Oral)   Resp 18   Ht 5\' 6"  (1.676 m)   Wt 274 lb (124.3 kg)   SpO2 99%   BMI 44.22 kg/m   EKG 07/23/17: NSR.  Nuclear stress test 07/23/17: IMPRESSION: 1. No reversible ischemia or infarction. 2. Normal left ventricular wall motion. 3. Left ventricular ejection fraction 69% 4. Non invasive risk stratification*: Low  Cardiac cath 02/24/07 (Dr. Terrence Dupont): FINDINGS:  LV showed good LV systolic function, LVH EF of 60-65%. Left main was long which was patent. LAD has 10-15% mid stenosis. Diagonalone is less than 1.5 mm, has  60-70% sequential mid stenosis. Diagonal 2is very very small which is patent. Ramus is patent proximally and thenbifurcates into superior and inferior branch. Superior branch is smallwhich has 70-80% focal stenosis. Inferior branch is a larger branchwhich is patent. Left circumflex is patent. OM1 is very small which ispatent. OM2 is small which is patent. RCA is patent. Plan is to maximize antianginal medications. If she continues to haverecurrent chest pain, then we will consider PCI to ramus branch.  CXR 08/07/17: IMPRESSION: IMPRESSION: No evidence of active disease.  Preoperative labs noted. Cr 1.39, H/H 11.0/34.0, PLT 270. PT/PTT WNL. A1c 7.9. UA negative leukocytes and nitrites. .   Awaiting most recent cardiology notes and response from Dr. Jenny Reichmann.  George Hugh [facility] Short Stay Center/Anesthesiology Phone [phone] 08/08/2017 3:36 PM  Addendum:  I received input from Dr. Jenny Reichmann who felt it was okay to proceed with surgery without pulmonology evaluation--as it was not an urgent referral. Also last cardiology records received. Patient as last seen by Dr. Terrence Dupont on 07/08/17. He is aware of surgery plans. She had recent non-ischemic stress test with normal LVEF.   If no acute changes then I anticipate that she can proceed as planned.   George Hugh [facility] Short Stay Center/Anesthesiology Phone [phone] 08/11/2017 12:27 PM

## 2017-08-11 ENCOUNTER — Other Ambulatory Visit: Payer: Self-pay | Admitting: Endocrinology

## 2017-08-12 ENCOUNTER — Other Ambulatory Visit: Payer: Self-pay | Admitting: Pharmacy Technician

## 2017-08-12 ENCOUNTER — Other Ambulatory Visit: Payer: Self-pay | Admitting: Endocrinology

## 2017-08-12 MED ORDER — VANCOMYCIN HCL 10 G IV SOLR
1500.0000 mg | INTRAVENOUS | Status: AC
  Filled 2017-08-12 (×2): qty 1500

## 2017-08-12 NOTE — Patient Outreach (Signed)
[place] [facility]) Care Management  08/12/2017  VRINDA HECKSTALL 1951/03/06 [id number]  Unsuccessful patient outreach call to Ms. Edinger. I left a HIPAA compliant message with her spouse.  Doreene Burke, [place] [phone]

## 2017-08-13 ENCOUNTER — Encounter (HOSPITAL_COMMUNITY)
Admission: RE | Disposition: A | Discharge status: Home / Self Care | Payer: Self-pay | Source: Ambulatory Visit | Attending: Orthopedic Surgery

## 2017-08-13 ENCOUNTER — Other Ambulatory Visit: Payer: Self-pay

## 2017-08-13 ENCOUNTER — Encounter (HOSPITAL_COMMUNITY): Payer: Self-pay | Admitting: Certified Registered Nurse Anesthetist

## 2017-08-13 ENCOUNTER — Inpatient Hospital Stay (HOSPITAL_COMMUNITY): Payer: Medicare Other | Admitting: Certified Registered Nurse Anesthetist

## 2017-08-13 ENCOUNTER — Inpatient Hospital Stay (HOSPITAL_COMMUNITY)
Admission: RE | Admit: 2017-08-13 | Discharge: 2017-08-19 | DRG: 455 | Disposition: A | Discharge status: Home / Self Care | Payer: Medicare Other | Source: Ambulatory Visit | Attending: Orthopedic Surgery | Admitting: Orthopedic Surgery

## 2017-08-13 ENCOUNTER — Inpatient Hospital Stay (HOSPITAL_COMMUNITY): Payer: Medicare Other | Admitting: Vascular Surgery

## 2017-08-13 ENCOUNTER — Inpatient Hospital Stay (HOSPITAL_COMMUNITY): Payer: Medicare Other

## 2017-08-13 DIAGNOSIS — E1122 Type 2 diabetes mellitus with diabetic chronic kidney disease: Secondary | ICD-10-CM | POA: Diagnosis present

## 2017-08-13 DIAGNOSIS — E785 Hyperlipidemia, unspecified: Secondary | ICD-10-CM | POA: Diagnosis present

## 2017-08-13 DIAGNOSIS — F419 Anxiety disorder, unspecified: Secondary | ICD-10-CM | POA: Diagnosis present

## 2017-08-13 DIAGNOSIS — Z885 Allergy status to narcotic agent status: Secondary | ICD-10-CM | POA: Diagnosis not present

## 2017-08-13 DIAGNOSIS — Z881 Allergy status to other antibiotic agents status: Secondary | ICD-10-CM

## 2017-08-13 DIAGNOSIS — M48061 Spinal stenosis, lumbar region without neurogenic claudication: Principal | ICD-10-CM | POA: Diagnosis present

## 2017-08-13 DIAGNOSIS — E1143 Type 2 diabetes mellitus with diabetic autonomic (poly)neuropathy: Secondary | ICD-10-CM | POA: Diagnosis present

## 2017-08-13 DIAGNOSIS — K219 Gastro-esophageal reflux disease without esophagitis: Secondary | ICD-10-CM | POA: Diagnosis present

## 2017-08-13 DIAGNOSIS — I2510 Atherosclerotic heart disease of native coronary artery without angina pectoris: Secondary | ICD-10-CM | POA: Diagnosis present

## 2017-08-13 DIAGNOSIS — D509 Iron deficiency anemia, unspecified: Secondary | ICD-10-CM | POA: Diagnosis present

## 2017-08-13 DIAGNOSIS — M79605 Pain in left leg: Secondary | ICD-10-CM | POA: Diagnosis not present

## 2017-08-13 DIAGNOSIS — M1990 Unspecified osteoarthritis, unspecified site: Secondary | ICD-10-CM | POA: Diagnosis present

## 2017-08-13 DIAGNOSIS — Z96653 Presence of artificial knee joint, bilateral: Secondary | ICD-10-CM | POA: Diagnosis present

## 2017-08-13 DIAGNOSIS — M5416 Radiculopathy, lumbar region: Secondary | ICD-10-CM | POA: Diagnosis not present

## 2017-08-13 DIAGNOSIS — Z794 Long term (current) use of insulin: Secondary | ICD-10-CM

## 2017-08-13 DIAGNOSIS — D631 Anemia in chronic kidney disease: Secondary | ICD-10-CM | POA: Diagnosis not present

## 2017-08-13 DIAGNOSIS — Z8711 Personal history of peptic ulcer disease: Secondary | ICD-10-CM | POA: Diagnosis not present

## 2017-08-13 DIAGNOSIS — F329 Major depressive disorder, single episode, unspecified: Secondary | ICD-10-CM | POA: Diagnosis present

## 2017-08-13 DIAGNOSIS — M5412 Radiculopathy, cervical region: Secondary | ICD-10-CM | POA: Diagnosis present

## 2017-08-13 DIAGNOSIS — E669 Obesity, unspecified: Secondary | ICD-10-CM | POA: Diagnosis present

## 2017-08-13 DIAGNOSIS — I129 Hypertensive chronic kidney disease with stage 1 through stage 4 chronic kidney disease, or unspecified chronic kidney disease: Secondary | ICD-10-CM | POA: Diagnosis present

## 2017-08-13 DIAGNOSIS — M79604 Pain in right leg: Secondary | ICD-10-CM | POA: Diagnosis not present

## 2017-08-13 DIAGNOSIS — Z96652 Presence of left artificial knee joint: Secondary | ICD-10-CM | POA: Diagnosis present

## 2017-08-13 DIAGNOSIS — Z87891 Personal history of nicotine dependence: Secondary | ICD-10-CM | POA: Diagnosis not present

## 2017-08-13 DIAGNOSIS — N184 Chronic kidney disease, stage 4 (severe): Secondary | ICD-10-CM | POA: Diagnosis not present

## 2017-08-13 DIAGNOSIS — M4326 Fusion of spine, lumbar region: Secondary | ICD-10-CM | POA: Diagnosis not present

## 2017-08-13 DIAGNOSIS — N189 Chronic kidney disease, unspecified: Secondary | ICD-10-CM | POA: Diagnosis present

## 2017-08-13 DIAGNOSIS — E039 Hypothyroidism, unspecified: Secondary | ICD-10-CM | POA: Diagnosis present

## 2017-08-13 DIAGNOSIS — M4320 Fusion of spine, site unspecified: Secondary | ICD-10-CM | POA: Diagnosis not present

## 2017-08-13 DIAGNOSIS — Z981 Arthrodesis status: Secondary | ICD-10-CM | POA: Diagnosis not present

## 2017-08-13 DIAGNOSIS — Z419 Encounter for procedure for purposes other than remedying health state, unspecified: Secondary | ICD-10-CM

## 2017-08-13 DIAGNOSIS — M48062 Spinal stenosis, lumbar region with neurogenic claudication: Secondary | ICD-10-CM | POA: Diagnosis not present

## 2017-08-13 DIAGNOSIS — M5410 Radiculopathy, site unspecified: Secondary | ICD-10-CM | POA: Diagnosis present

## 2017-08-13 DIAGNOSIS — M549 Dorsalgia, unspecified: Secondary | ICD-10-CM | POA: Diagnosis not present

## 2017-08-13 HISTORY — DX: Dorsalgia, unspecified: M54.9

## 2017-08-13 HISTORY — PX: ANTERIOR LAT LUMBAR FUSION: SHX1168

## 2017-08-13 HISTORY — DX: Other chronic pain: G89.29

## 2017-08-13 LAB — GLUCOSE, CAPILLARY
Glucose-Capillary: 190 mg/dL — ABNORMAL HIGH (ref 65–99)
Glucose-Capillary: 206 mg/dL — ABNORMAL HIGH (ref 65–99)
Glucose-Capillary: 242 mg/dL — ABNORMAL HIGH (ref 65–99)
Glucose-Capillary: 128 mg/dL — ABNORMAL HIGH (ref 65–99)

## 2017-08-13 SURGERY — ANTERIOR LATERAL LUMBAR FUSION 1 LEVEL
Anesthesia: General | Site: Spine Lumbar | Laterality: Left

## 2017-08-13 MED ORDER — ACETAMINOPHEN 10 MG/ML IV SOLN
INTRAVENOUS | Status: AC
  Administered 2017-08-13: 1000 mg via INTRAVENOUS
  Filled 2017-08-13: qty 100

## 2017-08-13 MED ORDER — BISACODYL 5 MG PO TBEC: 5.0000 mg | DELAYED_RELEASE_TABLET | Freq: Every day | ORAL | Status: DC | PRN

## 2017-08-13 MED ORDER — ROCURONIUM BROMIDE 50 MG/5ML IV SOSY
PREFILLED_SYRINGE | INTRAVENOUS | Status: DC | PRN
  Administered 2017-08-13: 50 mg via INTRAVENOUS

## 2017-08-13 MED ORDER — MIDAZOLAM HCL 2 MG/2ML IJ SOLN
INTRAMUSCULAR | Status: DC | PRN
  Administered 2017-08-13: 2 mg via INTRAVENOUS

## 2017-08-13 MED ORDER — BUPIVACAINE-EPINEPHRINE (PF) 0.25% -1:200000 IJ SOLN
INTRAMUSCULAR | Status: AC
  Filled 2017-08-13: qty 30

## 2017-08-13 MED ORDER — PROPOFOL 500 MG/50ML IV EMUL
INTRAVENOUS | Status: DC | PRN
  Administered 2017-08-13: 50 ug/kg/min via INTRAVENOUS

## 2017-08-13 MED ORDER — ALUM & MAG HYDROXIDE-SIMETH 200-200-20 MG/5ML PO SUSP: 30.0000 mL | Freq: Four times a day (QID) | ORAL | Status: DC | PRN

## 2017-08-13 MED ORDER — MENTHOL 3 MG MT LOZG
1.0000 | LOZENGE | OROMUCOSAL | Status: DC | PRN
  Administered 2017-08-14 – 2017-08-15 (×2): 3 mg via ORAL
  Filled 2017-08-13 (×4): qty 9

## 2017-08-13 MED ORDER — INSULIN GLARGINE 300 UNIT/ML ~~LOC~~ SOPN: 30.0000 [IU] | PEN_INJECTOR | Freq: Every day | SUBCUTANEOUS | Status: DC

## 2017-08-13 MED ORDER — PROGESTERONE MICRONIZED 100 MG PO CAPS
100.0000 mg | ORAL_CAPSULE | Freq: Every day | ORAL | Status: DC
Start: 2017-08-13 — End: 2017-08-19
  Administered 2017-08-13 – 2017-08-18 (×6): 100 mg via ORAL
  Filled 2017-08-13 (×6): qty 1

## 2017-08-13 MED ORDER — LEVETIRACETAM 250 MG PO TABS
250.0000 mg | ORAL_TABLET | Freq: Two times a day (BID) | ORAL | Status: DC
  Administered 2017-08-13 – 2017-08-19 (×11): 250 mg via ORAL
  Filled 2017-08-13 (×11): qty 1

## 2017-08-13 MED ORDER — HYDROMORPHONE HCL 1 MG/ML IJ SOLN
INTRAMUSCULAR | Status: AC
  Filled 2017-08-13: qty 1

## 2017-08-13 MED ORDER — ACETAMINOPHEN 10 MG/ML IV SOLN: 1000.0000 mg | Freq: Once | INTRAVENOUS | Status: AC

## 2017-08-13 MED ORDER — ACETAMINOPHEN 10 MG/ML IV SOLN
1000.0000 mg | Freq: Four times a day (QID) | INTRAVENOUS | Status: DC
  Administered 2017-08-13: 1000 mg via INTRAVENOUS

## 2017-08-13 MED ORDER — ESTRADIOL 0.1 MG/24HR TD PTWK: 0.1000 mg | MEDICATED_PATCH | TRANSDERMAL | Status: DC

## 2017-08-13 MED ORDER — FENTANYL CITRATE (PF) 100 MCG/2ML IJ SOLN
INTRAMUSCULAR | Status: DC | PRN
  Administered 2017-08-13 (×8): 50 ug via INTRAVENOUS

## 2017-08-13 MED ORDER — AMITRIPTYLINE HCL 10 MG PO TABS
10.0000 mg | ORAL_TABLET | Freq: Every day | ORAL | Status: DC
  Administered 2017-08-13 – 2017-08-18 (×6): 10 mg via ORAL
  Filled 2017-08-13 (×6): qty 1

## 2017-08-13 MED ORDER — VANCOMYCIN HCL IN DEXTROSE 1-5 GM/200ML-% IV SOLN
INTRAVENOUS | Status: AC
  Filled 2017-08-13: qty 200

## 2017-08-13 MED ORDER — FLUOXETINE HCL 20 MG PO CAPS
20.0000 mg | ORAL_CAPSULE | Freq: Every day | ORAL | Status: DC
  Administered 2017-08-15 – 2017-08-19 (×5): 20 mg via ORAL
  Filled 2017-08-13 (×5): qty 1

## 2017-08-13 MED ORDER — FLEET ENEMA 7-19 GM/118ML RE ENEM: 1.0000 | ENEMA | Freq: Once | RECTAL | Status: DC | PRN

## 2017-08-13 MED ORDER — LIRAGLUTIDE 18 MG/3ML ~~LOC~~ SOPN: 1.2000 mg | PEN_INJECTOR | Freq: Every day | SUBCUTANEOUS | Status: DC

## 2017-08-13 MED ORDER — SODIUM CHLORIDE 0.9% FLUSH: 3.0000 mL | INTRAVENOUS | Status: DC | PRN

## 2017-08-13 MED ORDER — PROPOFOL 1000 MG/100ML IV EMUL
INTRAVENOUS | Status: AC
  Filled 2017-08-13: qty 100

## 2017-08-13 MED ORDER — PANTOPRAZOLE SODIUM 40 MG IV SOLR
40.0000 mg | Freq: Every day | INTRAVENOUS | Status: DC
  Administered 2017-08-13 – 2017-08-15 (×3): 40 mg via INTRAVENOUS
  Filled 2017-08-13 (×3): qty 40

## 2017-08-13 MED ORDER — OXYCODONE-ACETAMINOPHEN 5-325 MG PO TABS
1.0000 | ORAL_TABLET | ORAL | Status: DC | PRN
  Administered 2017-08-13: 2 via ORAL
  Administered 2017-08-14 (×2): 1 via ORAL
  Administered 2017-08-15 – 2017-08-17 (×4): 2 via ORAL
  Administered 2017-08-17: 1 via ORAL
  Administered 2017-08-17: 2 via ORAL
  Administered 2017-08-17: 1 via ORAL
  Administered 2017-08-18 (×2): 2 via ORAL
  Administered 2017-08-18: 1 via ORAL
  Administered 2017-08-19: 2 via ORAL
  Filled 2017-08-13 (×15): qty 2
  Filled 2017-08-13 (×2): qty 1
  Filled 2017-08-13: qty 2

## 2017-08-13 MED ORDER — ONDANSETRON HCL 4 MG/2ML IJ SOLN
INTRAMUSCULAR | Status: AC
  Filled 2017-08-13: qty 2

## 2017-08-13 MED ORDER — ATORVASTATIN CALCIUM 10 MG PO TABS
20.0000 mg | ORAL_TABLET | Freq: Every day | ORAL | Status: DC
  Administered 2017-08-15 – 2017-08-19 (×5): 20 mg via ORAL
  Filled 2017-08-13 (×5): qty 2

## 2017-08-13 MED ORDER — INSULIN GLARGINE 100 UNIT/ML ~~LOC~~ SOLN
30.0000 [IU] | Freq: Every day | SUBCUTANEOUS | Status: DC
  Administered 2017-08-13 – 2017-08-18 (×6): 30 [IU] via SUBCUTANEOUS
  Filled 2017-08-13 (×7): qty 0.3

## 2017-08-13 MED ORDER — SUGAMMADEX SODIUM 200 MG/2ML IV SOLN
INTRAVENOUS | Status: DC | PRN
  Administered 2017-08-13: 200 mg via INTRAVENOUS

## 2017-08-13 MED ORDER — LACTATED RINGERS IV SOLN
INTRAVENOUS | Status: DC | PRN
  Administered 2017-08-13 (×2): via INTRAVENOUS

## 2017-08-13 MED ORDER — MIDAZOLAM HCL 2 MG/2ML IJ SOLN
INTRAMUSCULAR | Status: AC
  Filled 2017-08-13: qty 2

## 2017-08-13 MED ORDER — PHENOL 1.4 % MT LIQD: 1.0000 | OROMUCOSAL | Status: DC | PRN

## 2017-08-13 MED ORDER — LIDOCAINE 2% (20 MG/ML) 5 ML SYRINGE
INTRAMUSCULAR | Status: DC | PRN
  Administered 2017-08-13: 60 mg via INTRAVENOUS

## 2017-08-13 MED ORDER — HYDROMORPHONE HCL 1 MG/ML IJ SOLN
0.5000 mg | INTRAMUSCULAR | Status: DC | PRN
  Administered 2017-08-13: 0.5 mg via INTRAVENOUS

## 2017-08-13 MED ORDER — ACETAMINOPHEN 325 MG PO TABS
650.0000 mg | ORAL_TABLET | ORAL | Status: DC | PRN
  Administered 2017-08-15 – 2017-08-19 (×6): 650 mg via ORAL
  Filled 2017-08-13 (×6): qty 2

## 2017-08-13 MED ORDER — LIDOCAINE 2% (20 MG/ML) 5 ML SYRINGE
INTRAMUSCULAR | Status: AC
  Filled 2017-08-13: qty 5

## 2017-08-13 MED ORDER — VANCOMYCIN HCL 10 G IV SOLR
1250.0000 mg | Freq: Once | INTRAVENOUS | Status: AC
  Administered 2017-08-13: 1250 mg via INTRAVENOUS
  Filled 2017-08-13: qty 1250

## 2017-08-13 MED ORDER — PROPOFOL 10 MG/ML IV BOLUS
INTRAVENOUS | Status: DC | PRN
  Administered 2017-08-13: 150 mg via INTRAVENOUS

## 2017-08-13 MED ORDER — PENTOSAN POLYSULFATE SODIUM 100 MG PO CAPS
100.0000 mg | ORAL_CAPSULE | Freq: Two times a day (BID) | ORAL | Status: DC
Start: 2017-08-14 — End: 2017-08-19
  Administered 2017-08-14 – 2017-08-19 (×10): 100 mg via ORAL
  Filled 2017-08-13 (×12): qty 1

## 2017-08-13 MED ORDER — AMLODIPINE BESYLATE-VALSARTAN 10-320 MG PO TABS: 1.0000 | ORAL_TABLET | Freq: Every day | ORAL | Status: DC

## 2017-08-13 MED ORDER — HYDRALAZINE HCL 20 MG/ML IJ SOLN
5.0000 mg | Freq: Once | INTRAMUSCULAR | Status: AC
  Administered 2017-08-13: 5 mg via INTRAVENOUS

## 2017-08-13 MED ORDER — PROMETHAZINE HCL 25 MG/ML IJ SOLN: 6.2500 mg | INTRAMUSCULAR | Status: DC | PRN

## 2017-08-13 MED ORDER — ACETAMINOPHEN 650 MG RE SUPP: 650.0000 mg | RECTAL | Status: DC | PRN

## 2017-08-13 MED ORDER — SODIUM CHLORIDE 0.9% FLUSH
3.0000 mL | Freq: Two times a day (BID) | INTRAVENOUS | Status: DC
  Administered 2017-08-13 – 2017-08-17 (×3): 3 mL via INTRAVENOUS
  Administered 2017-08-18: 10 mL via INTRAVENOUS
  Administered 2017-08-18: 3 mL via INTRAVENOUS

## 2017-08-13 MED ORDER — THROMBIN 20000 UNITS EX KIT
PACK | CUTANEOUS | Status: AC
  Filled 2017-08-13: qty 1

## 2017-08-13 MED ORDER — GABAPENTIN 600 MG PO TABS
600.0000 mg | ORAL_TABLET | Freq: Two times a day (BID) | ORAL | Status: DC
  Administered 2017-08-13 – 2017-08-19 (×11): 600 mg via ORAL
  Filled 2017-08-13 (×11): qty 1

## 2017-08-13 MED ORDER — HYDRALAZINE HCL 20 MG/ML IJ SOLN
INTRAMUSCULAR | Status: AC
  Filled 2017-08-13: qty 1

## 2017-08-13 MED ORDER — THROMBIN (RECOMBINANT) 20000 UNITS EX SOLR
CUTANEOUS | Status: DC | PRN
  Administered 2017-08-13: 20000 [IU] via TOPICAL

## 2017-08-13 MED ORDER — TORSEMIDE 20 MG PO TABS
20.0000 mg | ORAL_TABLET | Freq: Every day | ORAL | Status: DC
  Administered 2017-08-15 – 2017-08-19 (×5): 20 mg via ORAL
  Filled 2017-08-13 (×6): qty 1

## 2017-08-13 MED ORDER — INSULIN ASPART 100 UNIT/ML ~~LOC~~ SOLN
0.0000 [IU] | Freq: Three times a day (TID) | SUBCUTANEOUS | Status: DC
  Administered 2017-08-13 – 2017-08-15 (×5): 5 [IU] via SUBCUTANEOUS
  Administered 2017-08-15 – 2017-08-16 (×2): 3 [IU] via SUBCUTANEOUS
  Administered 2017-08-16: 5 [IU] via SUBCUTANEOUS
  Administered 2017-08-17: 11 [IU] via SUBCUTANEOUS
  Administered 2017-08-17: 5 [IU] via SUBCUTANEOUS
  Administered 2017-08-17 – 2017-08-18 (×3): 3 [IU] via SUBCUTANEOUS
  Administered 2017-08-18 – 2017-08-19 (×3): 2 [IU] via SUBCUTANEOUS

## 2017-08-13 MED ORDER — FENTANYL CITRATE (PF) 250 MCG/5ML IJ SOLN
INTRAMUSCULAR | Status: AC
  Filled 2017-08-13: qty 5

## 2017-08-13 MED ORDER — ONDANSETRON HCL 4 MG PO TABS
4.0000 mg | ORAL_TABLET | Freq: Four times a day (QID) | ORAL | Status: DC | PRN
  Administered 2017-08-13: 4 mg via ORAL
  Filled 2017-08-13: qty 1

## 2017-08-13 MED ORDER — DIAZEPAM 5 MG PO TABS
5.0000 mg | ORAL_TABLET | Freq: Four times a day (QID) | ORAL | Status: DC | PRN
  Administered 2017-08-13 – 2017-08-14 (×2): 5 mg via ORAL
  Filled 2017-08-13 (×3): qty 1

## 2017-08-13 MED ORDER — PANTOPRAZOLE SODIUM 20 MG PO TBEC: 20.0000 mg | DELAYED_RELEASE_TABLET | Freq: Every day | ORAL | Status: DC

## 2017-08-13 MED ORDER — PROPOFOL 10 MG/ML IV BOLUS
INTRAVENOUS | Status: AC
  Filled 2017-08-13: qty 20

## 2017-08-13 MED ORDER — TRIMETHOPRIM 100 MG PO TABS
100.0000 mg | ORAL_TABLET | Freq: Every evening | ORAL | Status: DC
  Administered 2017-08-13 – 2017-08-18 (×6): 100 mg via ORAL
  Filled 2017-08-13 (×7): qty 1

## 2017-08-13 MED ORDER — DIAZEPAM 5 MG PO TABS
ORAL_TABLET | ORAL | Status: AC
Start: 2017-08-13 — End: 2017-08-14
  Filled 2017-08-13: qty 1

## 2017-08-13 MED ORDER — LEVOTHYROXINE SODIUM 100 MCG PO TABS
100.0000 ug | ORAL_TABLET | Freq: Every day | ORAL | Status: DC
  Administered 2017-08-15 – 2017-08-19 (×5): 100 ug via ORAL
  Filled 2017-08-13 (×6): qty 1

## 2017-08-13 MED ORDER — SENNOSIDES-DOCUSATE SODIUM 8.6-50 MG PO TABS: 1.0000 | ORAL_TABLET | Freq: Every evening | ORAL | Status: DC | PRN

## 2017-08-13 MED ORDER — BUPIVACAINE-EPINEPHRINE 0.25% -1:200000 IJ SOLN
INTRAMUSCULAR | Status: DC | PRN
  Administered 2017-08-13: 10 mL

## 2017-08-13 MED ORDER — ONDANSETRON HCL 4 MG/2ML IJ SOLN
INTRAMUSCULAR | Status: DC | PRN
  Administered 2017-08-13: 4 mg via INTRAVENOUS

## 2017-08-13 MED ORDER — AMLODIPINE BESYLATE 10 MG PO TABS
10.0000 mg | ORAL_TABLET | Freq: Every day | ORAL | Status: DC
  Administered 2017-08-13 – 2017-08-19 (×5): 10 mg via ORAL
  Filled 2017-08-13 (×6): qty 1

## 2017-08-13 MED ORDER — DOCUSATE SODIUM 100 MG PO CAPS
100.0000 mg | ORAL_CAPSULE | Freq: Two times a day (BID) | ORAL | Status: DC
  Administered 2017-08-13 – 2017-08-19 (×11): 100 mg via ORAL
  Filled 2017-08-13 (×11): qty 1

## 2017-08-13 MED ORDER — IRBESARTAN 300 MG PO TABS
300.0000 mg | ORAL_TABLET | Freq: Every day | ORAL | Status: DC
  Administered 2017-08-13 – 2017-08-19 (×5): 300 mg via ORAL
  Filled 2017-08-13 (×6): qty 1

## 2017-08-13 MED ORDER — BUPIVACAINE LIPOSOME 1.3 % IJ SUSP
20.0000 mL | Freq: Once | INTRAMUSCULAR | Status: DC
  Filled 2017-08-13: qty 20

## 2017-08-13 MED ORDER — ONDANSETRON HCL 4 MG/2ML IJ SOLN: 4.0000 mg | Freq: Four times a day (QID) | INTRAMUSCULAR | Status: DC | PRN

## 2017-08-13 MED ORDER — POTASSIUM CHLORIDE IN NACL 20-0.9 MEQ/L-% IV SOLN
INTRAVENOUS | Status: DC
  Administered 2017-08-13 – 2017-08-14 (×2): via INTRAVENOUS
  Filled 2017-08-13 (×2): qty 1000

## 2017-08-13 MED ORDER — ROCURONIUM BROMIDE 10 MG/ML (PF) SYRINGE
PREFILLED_SYRINGE | INTRAVENOUS | Status: AC
  Filled 2017-08-13: qty 5

## 2017-08-13 MED ORDER — ONDANSETRON HCL 4 MG PO TABS: 4.0000 mg | ORAL_TABLET | Freq: Four times a day (QID) | ORAL | Status: DC | PRN

## 2017-08-13 MED ORDER — POVIDONE-IODINE 7.5 % EX SOLN
Freq: Once | CUTANEOUS | Status: DC
  Filled 2017-08-13: qty 118

## 2017-08-13 MED ORDER — SODIUM CHLORIDE 0.9 % IV SOLN: 250.0000 mL | INTRAVENOUS | Status: DC

## 2017-08-13 MED ORDER — CLONIDINE HCL 0.1 MG PO TABS
0.1000 mg | ORAL_TABLET | Freq: Two times a day (BID) | ORAL | Status: DC
  Administered 2017-08-13 – 2017-08-19 (×7): 0.1 mg via ORAL
  Filled 2017-08-13 (×11): qty 1

## 2017-08-13 MED ORDER — FERROUS SULFATE 325 (65 FE) MG PO TABS
325.0000 mg | ORAL_TABLET | Freq: Every day | ORAL | Status: DC
  Administered 2017-08-15 – 2017-08-19 (×5): 325 mg via ORAL
  Filled 2017-08-13 (×5): qty 1

## 2017-08-13 MED ORDER — HYDROMORPHONE HCL 1 MG/ML IJ SOLN
0.2500 mg | INTRAMUSCULAR | Status: DC | PRN
  Administered 2017-08-13 (×5): 0.5 mg via INTRAVENOUS

## 2017-08-13 MED ORDER — ZOLPIDEM TARTRATE 5 MG PO TABS
5.0000 mg | ORAL_TABLET | Freq: Every evening | ORAL | Status: DC | PRN
  Administered 2017-08-13: 5 mg via ORAL
  Filled 2017-08-13 (×2): qty 1

## 2017-08-13 MED ORDER — MORPHINE SULFATE (PF) 2 MG/ML IV SOLN
1.0000 mg | INTRAVENOUS | Status: DC | PRN
  Administered 2017-08-13 – 2017-08-14 (×5): 2 mg via INTRAVENOUS
  Filled 2017-08-13 (×5): qty 1

## 2017-08-13 MED ORDER — INSULIN ASPART 100 UNIT/ML ~~LOC~~ SOLN
10.0000 [IU] | Freq: Three times a day (TID) | SUBCUTANEOUS | Status: DC
  Administered 2017-08-13 – 2017-08-19 (×15): 10 [IU] via SUBCUTANEOUS

## 2017-08-13 MED ORDER — VANCOMYCIN HCL 1000 MG IV SOLR
INTRAVENOUS | Status: DC | PRN
  Administered 2017-08-13: 1000 mg via INTRAVENOUS

## 2017-08-13 SURGICAL SUPPLY — 62 items
BENZOIN TINCTURE PRP APPL 2/3 (GAUZE/BANDAGES/DRESSINGS) ×2 IMPLANT
BLADE CLIPPER SURG (BLADE) IMPLANT
BLADE SURG 10 STRL SS (BLADE) ×2 IMPLANT
BOLT SPNL LRG 45X5.5XPLAT NS (Screw) ×2 IMPLANT
BONE VIVIGEN FORMABLE 10CC (Bone Implant) ×2 IMPLANT
CAGE COROENT XL 12X18X50 (Cage) ×2 IMPLANT
CLSR STERI-STRIP ANTIMIC 1/2X4 (GAUZE/BANDAGES/DRESSINGS) ×2 IMPLANT
COVER BACK TABLE 80X110 HD (DRAPES) ×2 IMPLANT
COVER SURGICAL LIGHT HANDLE (MISCELLANEOUS) ×2 IMPLANT
DRAPE C-ARM 42X72 X-RAY (DRAPES) ×2 IMPLANT
DRAPE POUCH INSTRU U-SHP 10X18 (DRAPES) ×2 IMPLANT
DRAPE SURG 17X23 STRL (DRAPES) ×8 IMPLANT
DURAPREP 26ML APPLICATOR (WOUND CARE) ×2 IMPLANT
ELECT BLADE 6.5 EXT (BLADE) ×2 IMPLANT
ELECT CAUTERY BLADE 6.4 (BLADE) ×2 IMPLANT
ELECT REM PT RETURN 9FT ADLT (ELECTROSURGICAL) ×2
ELECTRODE REM PT RTRN 9FT ADLT (ELECTROSURGICAL) ×1 IMPLANT
GAUZE SPONGE 4X4 12PLY STRL (GAUZE/BANDAGES/DRESSINGS) ×2 IMPLANT
GAUZE SPONGE 4X4 16PLY XRAY LF (GAUZE/BANDAGES/DRESSINGS) ×2 IMPLANT
GLOVE BIO SURGEON STRL SZ7 (GLOVE) ×4 IMPLANT
GLOVE BIO SURGEON STRL SZ8 (GLOVE) ×2 IMPLANT
GLOVE BIOGEL PI IND STRL 7.0 (GLOVE) ×1 IMPLANT
GLOVE BIOGEL PI IND STRL 8 (GLOVE) ×1 IMPLANT
GLOVE BIOGEL PI INDICATOR 7.0 (GLOVE) ×1
GLOVE BIOGEL PI INDICATOR 8 (GLOVE) ×1
GOWN STRL REUS W/ TWL LRG LVL3 (GOWN DISPOSABLE) ×1 IMPLANT
GOWN STRL REUS W/ TWL XL LVL3 (GOWN DISPOSABLE) ×2 IMPLANT
GOWN STRL REUS W/TWL LRG LVL3 (GOWN DISPOSABLE) ×1
GOWN STRL REUS W/TWL XL LVL3 (GOWN DISPOSABLE) ×2
KIT BASIN OR (CUSTOM PROCEDURE TRAY) ×2 IMPLANT
KIT DILATOR XLIF 5 (KITS) ×2 IMPLANT
KIT ROOM TURNOVER OR (KITS) ×2 IMPLANT
KIT SURGICAL ACCESS MAXCESS 4 (KITS) ×2 IMPLANT
MARKER SKIN DUAL TIP RULER LAB (MISCELLANEOUS) ×2 IMPLANT
MODULE EMG NEEDLE SSEP NVM5 (NEEDLE) ×2 IMPLANT
MODULE NVM5 NEXT GEN EMG (NEEDLE) ×2 IMPLANT
NEEDLE HYPO 25GX1X1/2 BEV (NEEDLE) ×2 IMPLANT
NEEDLE SPNL 18GX3.5 QUINCKE PK (NEEDLE) ×2 IMPLANT
NS IRRIG 1000ML POUR BTL (IV SOLUTION) ×4 IMPLANT
PACK LAMINECTOMY ORTHO (CUSTOM PROCEDURE TRAY) ×2 IMPLANT
PACK UNIVERSAL I (CUSTOM PROCEDURE TRAY) ×2 IMPLANT
PAD ARMBOARD 7.5X6 YLW CONV (MISCELLANEOUS) ×4 IMPLANT
PLATE DECADE XLIP 2H SZ12 (Plate) ×2 IMPLANT
SCREW 45MM (Screw) ×2 IMPLANT
SPONGE INTESTINAL PEANUT (DISPOSABLE) ×4 IMPLANT
SPONGE LAP 4X18 X RAY DECT (DISPOSABLE) ×2 IMPLANT
SPONGE SURGIFOAM ABS GEL 100 (HEMOSTASIS) IMPLANT
STAPLER VISISTAT 35W (STAPLE) ×2 IMPLANT
STRIP CLOSURE SKIN 1/2X4 (GAUZE/BANDAGES/DRESSINGS) IMPLANT
SURGIFLO W/THROMBIN 8M KIT (HEMOSTASIS) IMPLANT
SUT MNCRL AB 4-0 PS2 18 (SUTURE) ×2 IMPLANT
SUT VIC AB 0 CT1 18XCR BRD 8 (SUTURE) ×1 IMPLANT
SUT VIC AB 0 CT1 8-18 (SUTURE) ×1
SUT VIC AB 2-0 CT2 18 VCP726D (SUTURE) ×2 IMPLANT
SYR BULB IRRIGATION 50ML (SYRINGE) ×2 IMPLANT
SYR CONTROL 10ML LL (SYRINGE) ×2 IMPLANT
TAPE CLOTH SURG 6X10 WHT LF (GAUZE/BANDAGES/DRESSINGS) ×2 IMPLANT
TOWEL OR 17X24 6PK STRL BLUE (TOWEL DISPOSABLE) ×2 IMPLANT
TOWEL OR 17X26 10 PK STRL BLUE (TOWEL DISPOSABLE) ×2 IMPLANT
TRAY FOLEY CATH SILVER 16FR (SET/KITS/TRAYS/PACK) ×2 IMPLANT
WATER STERILE IRR 1000ML POUR (IV SOLUTION) ×2 IMPLANT
YANKAUER SUCT BULB TIP NO VENT (SUCTIONS) ×2 IMPLANT

## 2017-08-13 NOTE — Anesthesia Procedure Notes (Signed)
Procedure Name: Intubation Date/Time: 08/13/2017 8:40 AM Performed by: Genelle Bal, CRNA Pre-anesthesia Checklist: Patient identified, Emergency Drugs available, Suction available and Patient being monitored Patient Re-evaluated:Patient Re-evaluated prior to induction Oxygen Delivery Method: Circle system utilized Preoxygenation: Pre-oxygenation with 100% oxygen Induction Type: IV induction Ventilation: Mask ventilation without difficulty Laryngoscope Size: Dooly and 2 Grade View: Grade I Tube type: Oral Tube size: 7.0 mm Number of attempts: 1 Airway Equipment and Method: Stylet and Oral airway Placement Confirmation: ETT inserted through vocal cords under direct vision,  positive ETCO2 and breath sounds checked- equal and bilateral Secured at: 21 cm Tube secured with: Tape Dental Injury: Teeth and Oropharynx as per pre-operative assessment

## 2017-08-13 NOTE — Progress Notes (Signed)
Orthopedic Tech Progress Note Patient Details:  [name] Feb 14, 1951 [id number]  Patient ID: [name], female   DOB: 17-Aug-1951, 66 y.o.   MRN: [id number]   Hildred Priest 08/13/2017, 3:59 PM Called in bio-tech brace order; spoke with [facility]

## 2017-08-13 NOTE — Op Note (Signed)
NAMESHARNIKA, BINNEY               ACCOUNT NO.:  [id number]  MEDICAL RECORD NO.:  [id number]  LOCATION:  [id number]                        FACILITY:  [facility]  PHYSICIAN:  Phylliss Bob, MD      DATE OF BIRTH:  November 10, 1950  DATE OF PROCEDURE:  08/13/2017                              OPERATIVE REPORT   PREOPERATIVE DIAGNOSES: 1. Adjacent segment disease, L3-4, above the patient's previous L4-5     decompression and fusion. 2. L3-4 spinal stenosis. 3. Bilateral leg pain.  POSTOPERATIVE DIAGNOSES: 1. Adjacent segment disease, L3-4, above the patient's previous L4-5     decompression and fusion. 2. L3-4 spinal stenosis. 3. Bilateral leg pain.  PROCEDURES (STAGE I OF 2): 1. Anterior lumbar interbody fusion, L3-4, via direct left-sided     lateral approach. 2. Insertion of interbody device x1 (50 x 18 x 12 mm NuVasive     intervertebral spacer). 3. Placement of anterior instrumentation, L3-4, using a 12-mm NuVasive     lateral plate. 4. Use of morselized allograft - ViviGen. 5. Intraoperative use of fluoroscopy.  SURGEON:  Phylliss Bob, MD.  ASSISTANTPricilla Holm, PA-C.  ANESTHESIA:  General endotracheal anesthesia.  COMPLICATIONS:  None.  DISPOSITION:  Stable.  ESTIMATED BLOOD LOSS:  Minimal.  INDICATIONS FOR SURGERY:  Briefly, Ms. Roscher is a pleasant 66 year old female who is status post a previous L4-5 decompression and fusion procedure.  The patient did very well after that surgery, but more recently developed pain in her bilateral legs.  The patient's MRI did reveal adjacent segment disease at the level above the patient's fusion at L4-5.  She was very debilitated as a result of her pain.  I did point out to her that I did feel that the L3-4 level was the only level that would be expected to be causing her back and bilateral leg pain.  We did discuss proceeding with the anterior/posterior procedure, starting with stage I today, followed by a posterior fusion  procedure and decompression tomorrow.  The patient was very aware of the risks and limitations of surgery. She clearly was very frustrated with her ongoing pain, and did wish to proceed.  She did understand that her diabetes does increase her risk of nonunion and infection.  Her obesity does increase the risk of hardware failure and nonunion as well.  She did wish to proceed.  OPERATIVE DETAILS:  On August 13, 2017, the patient was brought to Surgery and general endotracheal anesthesia was administered.  The patient was placed in the lateral decubitus position with the left side up.  The patient's chest and lower extremities were secured to the bed. Her hips and knees were flexed.  The bed was then angled, in order to optimize exposure to the L3-4 intervertebral space.  Intraoperative fluoroscopy was brought into the field, in order to ensure a perfect lateral and AP trajectory to the L3-4 intervertebral space.  At this point, the left flank was prepped and draped in the usual sterile fashion and a time-out procedure was performed.  A transverse incision was made just above the iliac crest in line with the L3-4 intervertebral space.  The external and internal oblique musculature was dissected, and the  retroperitoneal space was entered.  The peritoneal contents were swept anteriorly and the psoas musculature was noted.  Dilators were placed sequentially through the psoas on the left side, and docked over the L3-4 intervertebral space.  I then sequentially dilated using neurologic monitoring and a self-retaining retractor was placed and secured to the bed using a rigid arm.  I then probed in the depths of the wound overlying the intervertebral space to ensure that there were no neurologic structures in the immediate vicinity of the exposure, and none were identified.  At this point, I proceeded with a thorough and complete L3-4 intervertebral diskectomy.  The contralateral annulus  was released.  Once the diskectomy was completed and the endplates were prepared, I proceeded with placing a series of trials and I did feel that 12-mm intervertebral spacer would be the most appropriate fit.  At this point, a 12 x 50 x 18 mm NuVasive intervertebral spacer was packed with ViviGen and tamped into position in the usual fashion.  I was pleased with the press-fit of the implant.  I was also pleased with the fact that there was noted to be partial restoration of the intervertebral space height.  At this point, a 12-mm plate was placed over the left-sided lateral aspect of the anterior spine overlying the L3-4 intervertebral space.  An awl was used to create a path for the L3 and L4 vertebral body screws.  45-mm screws were then placed, one in each vertebral body at L3 and L4.  The screws were then locked to the plate using the locking mechanism per the manufacture's recommendations. The break of the bed was then removed and leveled.  At this point, the plate was locked.  The wound was then copiously irrigated.  I was very pleased with the final AP and lateral fluoroscopic images.  The fascia was then closed using #1 Vicryl.  The subcutaneous layer was closed using 0 Vicryl followed by 2-0 Vicryl and the skin was closed using 4-0 Monocryl.  Benzoin and Steri-Strips were applied followed by sterile dressing.  All instrument counts were correct at the termination of the procedure.  There was noted to be no abnormal EMG activity noted throughout the entire surgery.  Of note, Pricilla Holm was my assistant throughout surgery, and did aid in retraction, suctioning, and closure from start to finish.     Phylliss Bob, MD     MD/MEDQ  D:  08/13/2017  T:  08/13/2017  Job:  [id number]

## 2017-08-13 NOTE — Progress Notes (Signed)
Received bedside report on patient. Alert and oriented X 4, 2 family members in room.  They requested the TV /nurse call bell be fixed, TV channels are not operating. Maintenance called. They will come as soon as they can get to it. This was relayed to patient. Patient has pain 7/10 percocet given. Vancomycin was hung. Checked compatibility with potassium, no contraindication.  Vancomycin hung. Will continue to monitor. Safety maintained.

## 2017-08-13 NOTE — Transfer of Care (Signed)
Immediate Anesthesia Transfer of Care Note  Patient: [name]  Procedure(s) Performed: LEFT SIDED LUMBAR 3-4 LATERAL INTERBODY FUSION WITH INSTRUMENTATION AND ALLOGRAFT (Left Spine Lumbar)  Patient Location: PACU  Anesthesia Type:General  Level of Consciousness: awake, alert  and oriented  Airway & Oxygen Therapy: Patient Spontanous Breathing and Patient connected to face mask oxygen  Post-op Assessment: Report given to RN and Post -op Vital signs reviewed and stable  Post vital signs: Reviewed and stable  Last Vitals:  Vitals:   08/13/17 0728  BP: (!) 128/54  Pulse: 76  Resp: 18  Temp: 36.6 C  SpO2: 100%    Last Pain:  Vitals:   08/13/17 0728  TempSrc: Oral  PainSc:       Patients Stated Pain Goal: 2 (74/14/23 9532)  Complications: No apparent anesthesia complications

## 2017-08-13 NOTE — Anesthesia Postprocedure Evaluation (Signed)
Anesthesia Post Note  Patient: HOLLACE MICHELLI  Procedure(s) Performed: LEFT SIDED LUMBAR 3-4 LATERAL INTERBODY FUSION WITH INSTRUMENTATION AND ALLOGRAFT (Left Spine Lumbar)     Patient location during evaluation: PACU Anesthesia Type: General Level of consciousness: awake and alert Pain management: pain level controlled Vital Signs Assessment: post-procedure vital signs reviewed and stable Respiratory status: spontaneous breathing, nonlabored ventilation, respiratory function stable and patient connected to nasal cannula oxygen Cardiovascular status: blood pressure returned to baseline and stable Postop Assessment: no apparent nausea or vomiting Anesthetic complications: no    Last Vitals:  Vitals:   08/13/17 1430 08/13/17 1445  BP: (!) 161/87 (!) 169/94  Pulse: 69 77  Resp: 10 10  Temp:  (!) 36.1 C  SpO2: 100% 100%    Last Pain:  Vitals:   08/13/17 1430  TempSrc:   PainSc: Asleep    LLE Motor Response: Responds to commands (08/13/17 1445) LLE Sensation: No tingling;No pain;No numbness (08/13/17 1445) RLE Motor Response: Responds to commands (08/13/17 1445) RLE Sensation: No tingling;No pain;No numbness (08/13/17 1445)      Tjuana Vickrey P Genny Caulder

## 2017-08-13 NOTE — H&P (Signed)
PREOPERATIVE H&P  Chief Complaint: Low back pain, bilateral leg pain  HPI: [name] is a 66 y.o. female who presents with ongoing pain in the back and bilateral legs  MRI reveals stenosis at her adjacent level above her fusion, at L3/4  Patient has failed multiple forms of conservative care and continues to have pain (see office notes for additional details regarding the patient's full course of treatment)  Past Medical History:  Diagnosis Date  . Anxiety   . Arthritis   . Blood dyscrasia    "FREE BLEEDER"  . CERVICAL RADICULOPATHY, LEFT   . CKD (chronic kidney disease)    DR. SANFORD  [facility]  . COMMON MIGRAINE   . CORONARY ARTERY DISEASE   . Cough    CURRENT COLD  . Cystitis   . DEPRESSION   . Diabetes mellitus, type II ([place])   . DIVERTICULOSIS-COLON   . Gastric ulcer 04/2008  . Gastroparesis   . GERD (gastroesophageal reflux disease)   . Hiatal hernia   . Hyperlipidemia   . Hypertension   . Hypothyroidism   . INSOMNIA-SLEEP DISORDER-UNSPEC   . Iron deficiency anemia   . Wears glasses    Past Surgical History:  Procedure Laterality Date  . ABDOMINAL HYSTERECTOMY    . BACK SURGERY  03/2016  . CHOLECYSTECTOMY  06/2009  . COLONOSCOPY    . ESOPHAGOGASTRODUODENOSCOPY    . EYE SURGERY Bilateral    lasik  . Gastric Wedge resection lipoma  11/2007   x2 with laparotomy and gastrostomy  . Left knee replacement    . Rigth knee replacement with revision  04/2008   Dr. Berenice Primas  . ROTATOR CUFF REPAIR Left 01/2009  . s/p bladder surgury  09/2009   Dr. Terance Hart   Social History   Socioeconomic History  . Marital status: Married    Spouse name: None  . Number of children: None  . Years of education: None  . Highest education level: None  Social Needs  . Financial resource strain: None  . Food insecurity - worry: None  . Food insecurity - inability: None  . Transportation needs - medical: None  . Transportation needs - non-medical: None    Occupational History  . Occupation: disabled  Tobacco Use  . Smoking status: Former Research scientist (life sciences)  . Smokeless tobacco: Never Used  . Tobacco comment: quit 30 years ago  Substance and Sexual Activity  . Alcohol use: No    Alcohol/week: 0.0 oz  . Drug use: No  . Sexual activity: Not Currently    Birth control/protection: None  Other Topics Concern  . None  Social History Narrative   Patient gets regular exercise   Family History  Problem Relation Age of Onset  . Diabetes Sister        x 3  . Heart disease Sister        x2  . Diabetes Brother        x3  . Heart disease Brother        x2  . Coronary artery disease Other        female 1st degree  . Hypertension Other   . Colon cancer Neg Hx    Allergies  Allergen Reactions  . Hydrocodone Other (See Comments)    Tachycardia   . Ciprofloxacin Other (See Comments)    Dizziness   . Penicillins Hives    Has patient had a PCN reaction causing immediate rash, facial/tongue/throat swelling, SOB or lightheadedness with  hypotension: No Has patient had a PCN reaction causing severe rash involving mucus membranes or skin necrosis: No Has patient had a PCN reaction that required hospitalization No Has patient had a PCN reaction occurring within the last 10 years: No If all of the above answers are "NO", then may proceed with Cephalosporin use.    Prior to Admission medications   Medication Sig Start Date End Date Taking? Authorizing Provider  amitriptyline (ELAVIL) 10 MG tablet Take 10 mg by mouth at bedtime.  07/26/15  Yes [provider]  amLODipine-valsartan (EXFORGE) 10-320 MG tablet Take 1 tablet by mouth daily. Annual appt due in Sept must see provider for future refills 05/05/17  Yes Biagio Borg, MD  aspirin 81 MG EC tablet Take 81 mg by mouth every evening.    Yes [provider]  atorvastatin (LIPITOR) 20 MG tablet Take 1 tablet (20 mg total) by mouth daily. 05/16/17  Yes Elayne Snare, MD  cloNIDine (CATAPRES)  0.1 MG tablet TAKE ONE TABLET BY MOUTH TWICE A DAY 08/11/17  Yes Elayne Snare, MD  ELMIRON 100 MG capsule Take 100 mg by mouth 2 (two) times daily.  07/20/13  Yes [provider]  estradiol (CLIMARA - DOSED IN MG/24 HR) 0.1 mg/24hr patch Place 1 patch (0.1 mg total) onto the skin once a week. 07/31/17  Yes [name], MD  FLUoxetine (PROZAC) 20 MG capsule TAKE ONE CAPSULE BY MOUTH DAILY Patient taking differently: TAKE 20mg  BY MOUTH DAILY 03/06/17  Yes Biagio Borg, MD  gabapentin (NEURONTIN) 600 MG tablet Take 600 mg by mouth 2 (two) times daily.   Yes [provider]  HYDROcodone-acetaminophen (NORCO) 10-325 MG tablet Take 1 tablet by mouth every 6 (six) hours as needed for moderate pain. 07/22/17  Yes [provider]  insulin aspart (NOVOLOG FLEXPEN) 100 UNIT/ML FlexPen Inject 10 Units into the skin 3 (three) times daily with meals.   Yes [provider]  insulin glargine (LANTUS) 100 UNIT/ML injection Inject 30-35 Units into the skin at bedtime. Takes 30 units if glucose is <300 and 35 units if glucose is >300.   Yes [provider]  Insulin Glargine (TOUJEO SOLOSTAR) 300 UNIT/ML SOPN Inject 30 Units into the skin daily. 08/06/17  Yes Elayne Snare, MD  lansoprazole (PREVACID) 15 MG capsule Take 15 mg by mouth every evening.    Yes [provider]  levETIRAcetam (KEPPRA) 250 MG tablet Take 250-500 mg by mouth 2 (two) times daily. Dose depends on level of pain. 04/24/17  Yes [provider]  levothyroxine (SYNTHROID, LEVOTHROID) 100 MCG tablet TAKE 1 TABLET(100 MCG) BY MOUTH DAILY 08/12/17  Yes Elayne Snare, MD  meloxicam (MOBIC) 15 MG tablet Take 15 mg by mouth daily.  12/23/16  Yes [provider]  ondansetron (ZOFRAN) 4 MG tablet TAKE 1 TABLET BY MOUTH EVERY 6 HOURS AS NEEDED FOR NAUSEA Patient taking differently: Take 4 mg by mouth every six hours as needed for nausea 10/11/16  Yes Biagio Borg, MD  progesterone  (PROMETRIUM) 100 MG capsule Take 1 capsule (100 mg total) by mouth at bedtime. 07/03/17  Yes [name], MD  torsemide (DEMADEX) 20 MG tablet Take 20 mg by mouth daily.   Yes [provider]  VICTOZA 18 MG/3ML SOPN Inject 0.2 mLs (1.2 mg total) into the skin daily. Inject once daily at the same time Patient not taking: Reported on 08/06/2017 05/19/17  Yes Elayne Snare, MD  ferrous sulfate 325 (65 FE) MG tablet  Take 325 mg by mouth daily with breakfast.  01/21/17   [provider]  glucose blood (ACCU-CHEK AVIVA PLUS) test strip Use to test blood sugar 3 times daily- Dx code E11.9 11/13/16   Elayne Snare, MD  trimethoprim (TRIMPEX) 100 MG tablet Take 100 mg by mouth every evening.  08/08/14   [provider]     All other systems have been reviewed and were otherwise negative with the exception of those mentioned in the HPI and as above.  Physical Exam: Vitals:   08/13/17 0728  BP: (!) 128/54  Pulse: 76  Resp: 18  Temp: 97.9 F (36.6 C)  SpO2: 100%    General: Alert, no acute distress Cardiovascular: No pedal edema Respiratory: No cyanosis, no use of accessory musculature Skin: No lesions in the area of chief complaint Neurologic: Sensation intact distally Psychiatric: Patient is competent for consent with normal mood and affect Lymphatic: No axillary or cervical lymphadenopathy  MUSCULOSKELETAL: + TTP low back  Assessment/Plan: Bilateral leg pain  Plan for Procedure(s): LEFT SIDED LUMBAR 3-4 LATERAL INTERBODY FUSION WITH INSTRUMENTATION AND ALLOGRAFT   Sinclair Ship, MD 08/13/2017 7:51 AM

## 2017-08-13 NOTE — Anesthesia Preprocedure Evaluation (Addendum)
Anesthesia Evaluation  Patient identified by MRN, date of birth, ID band Patient awake    Reviewed: Allergy & Precautions, H&P , NPO status , Patient's Chart, lab work & pertinent test results  History of Anesthesia Complications Negative for: history of anesthetic complications  Airway Mallampati: II  TM Distance: >3 FB Neck ROM: full    Dental  (+) Dental Advisory Given, Edentulous Upper, Edentulous Lower   Pulmonary former smoker,    Pulmonary exam normal breath sounds clear to auscultation       Cardiovascular hypertension, Pt. on medications and Pt. on home beta blockers + CAD  Normal cardiovascular exam Rhythm:regular Rate:Normal  ECG: NSR, rate 92   Nuclear stress test 07/23/17: IMPRESSION: 1. No reversible ischemia or infarction. 2. Normal left ventricular wall motion. 3. Left ventricular ejection fraction 69% 4. Non invasive risk stratification*: Low  Cardiologist is Dr. Terrence Dupont, last visit 02/12/16. Records pending, but with recent non-ischemic stress test, EF 69%.    Neuro/Psych  Headaches, Anxiety Depression    GI/Hepatic Neg liver ROS, hiatal hernia, PUD, GERD  Medicated and Controlled,  Endo/Other  diabetes, Type 2, Insulin DependentHypothyroidism Morbid obesity  Renal/GU Renal disease     Musculoskeletal  (+) Arthritis , Osteoarthritis,    Abdominal (+) + obese,   Peds  Hematology  (+) anemia ,   Anesthesia Other Findings HLD Bilateral leg pain   Reproductive/Obstetrics                           Anesthesia Physical  Anesthesia Plan  ASA: III  Anesthesia Plan: General   Post-op Pain Management:    Induction: Intravenous  PONV Risk Score and Plan: 3 and Ondansetron and Midazolam  Airway Management Planned: Oral ETT  Additional Equipment:   Intra-op Plan:   Post-operative Plan: Extubation in OR  Informed Consent: I have reviewed the patients History and  Physical, chart, labs and discussed the procedure including the risks, benefits and alternatives for the proposed anesthesia with the patient or authorized representative who has indicated his/her understanding and acceptance.   Dental Advisory Given  Plan Discussed with: CRNA  Anesthesia Plan Comments:        Anesthesia Quick Evaluation

## 2017-08-14 ENCOUNTER — Inpatient Hospital Stay (HOSPITAL_COMMUNITY): Payer: Medicare Other

## 2017-08-14 ENCOUNTER — Encounter (HOSPITAL_COMMUNITY): Payer: Self-pay | Admitting: Orthopedic Surgery

## 2017-08-14 ENCOUNTER — Inpatient Hospital Stay (HOSPITAL_COMMUNITY): Admission: RE | Admit: 2017-08-14 | Payer: Medicare Other | Source: Ambulatory Visit | Admitting: Orthopedic Surgery

## 2017-08-14 ENCOUNTER — Encounter (HOSPITAL_COMMUNITY)
Admission: RE | Disposition: A | Discharge status: Home / Self Care | Payer: Self-pay | Source: Ambulatory Visit | Attending: Orthopedic Surgery

## 2017-08-14 ENCOUNTER — Inpatient Hospital Stay (HOSPITAL_COMMUNITY): Payer: Medicare Other | Admitting: Anesthesiology

## 2017-08-14 DIAGNOSIS — M79605 Pain in left leg: Secondary | ICD-10-CM | POA: Diagnosis not present

## 2017-08-14 DIAGNOSIS — M79604 Pain in right leg: Secondary | ICD-10-CM | POA: Diagnosis not present

## 2017-08-14 LAB — GLUCOSE, CAPILLARY
Glucose-Capillary: 144 mg/dL — ABNORMAL HIGH (ref 65–99)
Glucose-Capillary: 154 mg/dL — ABNORMAL HIGH (ref 65–99)
Glucose-Capillary: 212 mg/dL — ABNORMAL HIGH (ref 65–99)
Glucose-Capillary: 203 mg/dL — ABNORMAL HIGH (ref 65–99)

## 2017-08-14 LAB — PREPARE RBC (CROSSMATCH)

## 2017-08-14 SURGERY — POSTERIOR LUMBAR FUSION 2 LEVEL
Anesthesia: General | Site: Spine Lumbar

## 2017-08-14 MED ORDER — EPHEDRINE SULFATE 50 MG/ML IJ SOLN
INTRAMUSCULAR | Status: DC | PRN
  Administered 2017-08-14 (×2): 10 mg via INTRAVENOUS

## 2017-08-14 MED ORDER — FENTANYL CITRATE (PF) 250 MCG/5ML IJ SOLN
INTRAMUSCULAR | Status: AC
  Filled 2017-08-14: qty 5

## 2017-08-14 MED ORDER — ESMOLOL HCL 100 MG/10ML IV SOLN
INTRAVENOUS | Status: DC | PRN
Start: 2017-08-14 — End: 2017-08-14
  Administered 2017-08-14 (×4): 10 mg via INTRAVENOUS

## 2017-08-14 MED ORDER — PROPOFOL 10 MG/ML IV BOLUS
INTRAVENOUS | Status: AC
  Filled 2017-08-14: qty 20

## 2017-08-14 MED ORDER — LIDOCAINE HCL (CARDIAC) 20 MG/ML IV SOLN
INTRAVENOUS | Status: DC | PRN
  Administered 2017-08-14: 80 mg via INTRAVENOUS

## 2017-08-14 MED ORDER — LIDOCAINE 2% (20 MG/ML) 5 ML SYRINGE
INTRAMUSCULAR | Status: AC
  Filled 2017-08-14: qty 5

## 2017-08-14 MED ORDER — THROMBIN (RECOMBINANT) 20000 UNITS EX SOLR
CUTANEOUS | Status: DC | PRN
  Administered 2017-08-14: 20000 [IU] via TOPICAL

## 2017-08-14 MED ORDER — BUPIVACAINE HCL (PF) 0.25 % IJ SOLN
INTRAMUSCULAR | Status: AC
  Filled 2017-08-14: qty 10

## 2017-08-14 MED ORDER — BUPIVACAINE-EPINEPHRINE 0.25% -1:200000 IJ SOLN
INTRAMUSCULAR | Status: DC | PRN
  Administered 2017-08-14: 20 mL
  Administered 2017-08-14: 10 mL

## 2017-08-14 MED ORDER — BUPIVACAINE-EPINEPHRINE (PF) 0.25% -1:200000 IJ SOLN
INTRAMUSCULAR | Status: AC
  Filled 2017-08-14: qty 30

## 2017-08-14 MED ORDER — 0.9 % SODIUM CHLORIDE (POUR BTL) OPTIME
TOPICAL | Status: DC | PRN
  Administered 2017-08-14 (×2): 1000 mL

## 2017-08-14 MED ORDER — BUPIVACAINE LIPOSOME 1.3 % IJ SUSP
20.0000 mL | INTRAMUSCULAR | Status: DC
  Filled 2017-08-14: qty 20

## 2017-08-14 MED ORDER — LACTATED RINGERS IV SOLN
INTRAVENOUS | Status: DC | PRN
  Administered 2017-08-14 (×3): via INTRAVENOUS

## 2017-08-14 MED ORDER — PHENYLEPHRINE HCL 10 MG/ML IJ SOLN
INTRAVENOUS | Status: DC | PRN
  Administered 2017-08-14: 50 ug/min via INTRAVENOUS

## 2017-08-14 MED ORDER — VANCOMYCIN HCL 10 G IV SOLR
1250.0000 mg | INTRAVENOUS | Status: AC
  Administered 2017-08-14: 1250 mg via INTRAVENOUS
  Filled 2017-08-14: qty 1250

## 2017-08-14 MED ORDER — HYDROMORPHONE HCL 1 MG/ML IJ SOLN
0.2500 mg | INTRAMUSCULAR | Status: DC | PRN
  Administered 2017-08-14: 0.5 mg via INTRAVENOUS

## 2017-08-14 MED ORDER — ROCURONIUM BROMIDE 100 MG/10ML IV SOLN
INTRAVENOUS | Status: DC | PRN
  Administered 2017-08-14: 10 mg via INTRAVENOUS
  Administered 2017-08-14 (×2): 20 mg via INTRAVENOUS
  Administered 2017-08-14: 40 mg via INTRAVENOUS

## 2017-08-14 MED ORDER — HYDROMORPHONE HCL 1 MG/ML IJ SOLN
INTRAMUSCULAR | Status: AC
  Filled 2017-08-14: qty 1

## 2017-08-14 MED ORDER — PHENYLEPHRINE HCL 10 MG/ML IJ SOLN
INTRAMUSCULAR | Status: DC | PRN
  Administered 2017-08-14 (×2): 200 ug via INTRAVENOUS

## 2017-08-14 MED ORDER — THROMBIN 20000 UNITS EX KIT
PACK | CUTANEOUS | Status: AC
  Filled 2017-08-14: qty 1

## 2017-08-14 MED ORDER — ROCURONIUM BROMIDE 10 MG/ML (PF) SYRINGE
PREFILLED_SYRINGE | INTRAVENOUS | Status: AC
  Filled 2017-08-14: qty 5

## 2017-08-14 MED ORDER — HEMOSTATIC AGENTS (NO CHARGE) OPTIME
TOPICAL | Status: DC | PRN
  Administered 2017-08-14: 1 via TOPICAL

## 2017-08-14 MED ORDER — FENTANYL CITRATE (PF) 100 MCG/2ML IJ SOLN
INTRAMUSCULAR | Status: DC | PRN
  Administered 2017-08-14: 100 ug via INTRAVENOUS
  Administered 2017-08-14 (×13): 50 ug via INTRAVENOUS

## 2017-08-14 MED ORDER — PROPOFOL 500 MG/50ML IV EMUL
INTRAVENOUS | Status: DC | PRN
  Administered 2017-08-14: 30 ug/kg/min via INTRAVENOUS

## 2017-08-14 MED ORDER — MIDAZOLAM HCL 2 MG/2ML IJ SOLN
INTRAMUSCULAR | Status: AC
  Filled 2017-08-14: qty 2

## 2017-08-14 MED ORDER — BUPIVACAINE LIPOSOME 1.3 % IJ SUSP
INTRAMUSCULAR | Status: DC | PRN
  Administered 2017-08-14: 20 mL

## 2017-08-14 MED ORDER — PROPOFOL 10 MG/ML IV BOLUS
INTRAVENOUS | Status: DC | PRN
  Administered 2017-08-14: 150 mg via INTRAVENOUS

## 2017-08-14 MED ORDER — HYDROMORPHONE HCL 1 MG/ML IJ SOLN: 1.0000 mg | INTRAMUSCULAR | Status: DC | PRN

## 2017-08-14 MED ORDER — SUGAMMADEX SODIUM 500 MG/5ML IV SOLN
INTRAVENOUS | Status: DC | PRN
  Administered 2017-08-14: 250 mg via INTRAVENOUS

## 2017-08-14 MED FILL — Sodium Chloride IV Soln 0.9%: INTRAVENOUS | Qty: 1000 | Status: AC

## 2017-08-14 MED FILL — Heparin Sodium (Porcine) Inj 1000 Unit/ML: INTRAMUSCULAR | Qty: 30 | Status: AC

## 2017-08-14 SURGICAL SUPPLY — 93 items
BENZOIN TINCTURE PRP APPL 2/3 (GAUZE/BANDAGES/DRESSINGS) ×2 IMPLANT
BLADE CLIPPER SURG (BLADE) ×2 IMPLANT
BONE VIVIGEN FORMABLE 5.4CC (Bone Implant) ×2 IMPLANT
BUR PRESCISION 1.7 ELITE (BURR) ×2 IMPLANT
BUR ROUND FLUTED 5 RND (BURR) ×2 IMPLANT
BUR ROUND PRECISION 4.0 (BURR) IMPLANT
BUR SABER RD CUTTING 3.0 (BURR) ×2 IMPLANT
CARTRIDGE OIL MAESTRO DRILL (MISCELLANEOUS) ×1 IMPLANT
CLSR STERI-STRIP ANTIMIC 1/2X4 (GAUZE/BANDAGES/DRESSINGS) ×2 IMPLANT
CONT SPEC 4OZ CLIKSEAL STRL BL (MISCELLANEOUS) ×2 IMPLANT
COVER BACK TABLE 60X90IN (DRAPES) ×2 IMPLANT
COVER MAYO STAND STRL (DRAPES) ×4 IMPLANT
COVER SURGICAL LIGHT HANDLE (MISCELLANEOUS) ×4 IMPLANT
DIFFUSER DRILL AIR PNEUMATIC (MISCELLANEOUS) ×2 IMPLANT
DRAIN CHANNEL 15F RND FF W/TCR (WOUND CARE) IMPLANT
DRAPE C-ARM 42X72 X-RAY (DRAPES) ×2 IMPLANT
DRAPE POUCH INSTRU U-SHP 10X18 (DRAPES) ×2 IMPLANT
DRAPE SURG 17X23 STRL (DRAPES) ×8 IMPLANT
DRSG MEPILEX BORDER 4X12 (GAUZE/BANDAGES/DRESSINGS) IMPLANT
DRSG MEPILEX BORDER 4X8 (GAUZE/BANDAGES/DRESSINGS) IMPLANT
DURAPREP 26ML APPLICATOR (WOUND CARE) ×2 IMPLANT
ELECT BLADE 4.0 EZ CLEAN MEGAD (MISCELLANEOUS) ×2
ELECT BLADE 6.5 EXT (BLADE) ×2 IMPLANT
ELECT CAUTERY BLADE 6.4 (BLADE) ×2 IMPLANT
ELECT REM PT RETURN 9FT ADLT (ELECTROSURGICAL) ×2
ELECTRODE BLDE 4.0 EZ CLN MEGD (MISCELLANEOUS) ×1 IMPLANT
ELECTRODE REM PT RTRN 9FT ADLT (ELECTROSURGICAL) ×1 IMPLANT
EVACUATOR SILICONE 100CC (DRAIN) IMPLANT
FEE INTRAOP MONITOR IMPULS NCS (MISCELLANEOUS) ×1 IMPLANT
GAUZE SPONGE 4X4 12PLY STRL (GAUZE/BANDAGES/DRESSINGS) ×2 IMPLANT
GAUZE SPONGE 4X4 12PLY STRL LF (GAUZE/BANDAGES/DRESSINGS) ×2 IMPLANT
GAUZE SPONGE 4X4 16PLY XRAY LF (GAUZE/BANDAGES/DRESSINGS) ×2 IMPLANT
GLOVE BIO SURGEON STRL SZ7 (GLOVE) ×2 IMPLANT
GLOVE BIO SURGEON STRL SZ8 (GLOVE) ×2 IMPLANT
GLOVE BIOGEL PI IND STRL 7.0 (GLOVE) ×1 IMPLANT
GLOVE BIOGEL PI IND STRL 8 (GLOVE) ×1 IMPLANT
GLOVE BIOGEL PI INDICATOR 7.0 (GLOVE) ×1
GLOVE BIOGEL PI INDICATOR 8 (GLOVE) ×1
GOWN STRL REUS W/ TWL LRG LVL3 (GOWN DISPOSABLE) ×2 IMPLANT
GOWN STRL REUS W/ TWL XL LVL3 (GOWN DISPOSABLE) ×1 IMPLANT
GOWN STRL REUS W/TWL LRG LVL3 (GOWN DISPOSABLE) ×2
GOWN STRL REUS W/TWL XL LVL3 (GOWN DISPOSABLE) ×1
GUIDEWIRE SHARP VIPER II (WIRE) ×4 IMPLANT
INTRAOP MONITOR FEE IMPULS NCS (MISCELLANEOUS) ×1
INTRAOP MONITOR FEE IMPULSE (MISCELLANEOUS) ×1
IV CATH 14GX2 1/4 (CATHETERS) ×2 IMPLANT
KIT BASIN OR (CUSTOM PROCEDURE TRAY) ×2 IMPLANT
KIT POSITION SURG JACKSON T1 (MISCELLANEOUS) ×2 IMPLANT
KIT ROOM TURNOVER OR (KITS) ×2 IMPLANT
MARKER SKIN DUAL TIP RULER LAB (MISCELLANEOUS) ×2 IMPLANT
NEEDLE 22X1 1/2 (OR ONLY) (NEEDLE) ×2 IMPLANT
NEEDLE HYPO 25GX1X1/2 BEV (NEEDLE) ×4 IMPLANT
NEEDLE JAMSHIDI VIPER (NEEDLE) ×6 IMPLANT
NEEDLE SPNL 18GX3.5 QUINCKE PK (NEEDLE) ×4 IMPLANT
NS IRRIG 1000ML POUR BTL (IV SOLUTION) ×2 IMPLANT
OIL CARTRIDGE MAESTRO DRILL (MISCELLANEOUS) ×2
PACK LAMINECTOMY ORTHO (CUSTOM PROCEDURE TRAY) ×2 IMPLANT
PACK UNIVERSAL I (CUSTOM PROCEDURE TRAY) ×2 IMPLANT
PAD ARMBOARD 7.5X6 YLW CONV (MISCELLANEOUS) ×4 IMPLANT
PATTIES SURGICAL .5 X1 (DISPOSABLE) ×2 IMPLANT
PATTIES SURGICAL .5 X3 (DISPOSABLE) IMPLANT
PATTIES SURGICAL .5X1.5 (GAUZE/BANDAGES/DRESSINGS) IMPLANT
PATTIES SURGICAL .75X.75 (GAUZE/BANDAGES/DRESSINGS) IMPLANT
PROBE PEDCLE PROBE MAGSTM DISP (MISCELLANEOUS) ×2 IMPLANT
ROD EXPEDIUM PER BENT 65MM (Rod) ×2 IMPLANT
ROD EXPEDIUM PRE BENT 5.5X75 (Rod) ×2 IMPLANT
SCREW EXPEDIUM POLYAXIAL 6X40 (Screw) ×4 IMPLANT
SCREW EXPEDIUM POLYAXIAL 7X40M (Screw) ×6 IMPLANT
SCREW POLY VIPER2 7X40MM (Screw) ×2 IMPLANT
SCREW SET SINGLE INNER MIS (Screw) ×14 IMPLANT
SLEEVE SURGEON STRL (DRAPES) ×2 IMPLANT
SPONGE INTESTINAL PEANUT (DISPOSABLE) ×2 IMPLANT
SPONGE SURGIFOAM ABS GEL 100 (HEMOSTASIS) ×2 IMPLANT
STRIP CLOSURE SKIN 1/2X4 (GAUZE/BANDAGES/DRESSINGS) IMPLANT
SURGIFLO W/THROMBIN 8M KIT (HEMOSTASIS) IMPLANT
SUT MNCRL AB 4-0 PS2 18 (SUTURE) ×4 IMPLANT
SUT VIC AB 0 CT1 18XCR BRD 8 (SUTURE) ×2 IMPLANT
SUT VIC AB 0 CT1 8-18 (SUTURE) ×2
SUT VIC AB 1 CT1 18XCR BRD 8 (SUTURE) ×2 IMPLANT
SUT VIC AB 1 CT1 8-18 (SUTURE) ×2
SUT VIC AB 2-0 CT2 18 VCP726D (SUTURE) ×4 IMPLANT
SYR 20CC LL (SYRINGE) ×2 IMPLANT
SYR BULB IRRIGATION 50ML (SYRINGE) ×2 IMPLANT
SYR CONTROL 10ML LL (SYRINGE) ×6 IMPLANT
SYR TB 1ML LUER SLIP (SYRINGE) IMPLANT
TAP CANN VIPER2 DL 5.0 (TAP) ×2 IMPLANT
TAP CANN VIPER2 DL 6.0 (TAP) ×2 IMPLANT
TAPE CLOTH SURG 4X10 WHT LF (GAUZE/BANDAGES/DRESSINGS) ×2 IMPLANT
TOWEL OR 17X24 6PK STRL BLUE (TOWEL DISPOSABLE) ×2 IMPLANT
TOWEL OR 17X26 10 PK STRL BLUE (TOWEL DISPOSABLE) ×2 IMPLANT
TRAY FOLEY W/METER SILVER 16FR (SET/KITS/TRAYS/PACK) IMPLANT
WATER STERILE IRR 1000ML POUR (IV SOLUTION) IMPLANT
YANKAUER SUCT BULB TIP NO VENT (SUCTIONS) ×2 IMPLANT

## 2017-08-14 NOTE — Progress Notes (Signed)
VS taken on arrival to floor

## 2017-08-14 NOTE — Transfer of Care (Signed)
Immediate Anesthesia Transfer of Care Note  Patient: [name]  Procedure(s) Performed: LUMBAR 3-4, LUMBAR 4-5 POSTERIOR SPINAL FUSION WITH INSTRUMENTATION AND ALLOGRAFT; REQUEST 3.5 HOURS (N/A Spine Lumbar)  Patient Location: PACU  Anesthesia Type:General  Level of Consciousness: awake and patient cooperative  Airway & Oxygen Therapy: Patient Spontanous Breathing  Post-op Assessment: Report given to RN and Post -op Vital signs reviewed and stable  Post vital signs: Reviewed and stable  Last Vitals:  Vitals:   08/14/17 0100 08/14/17 0500  BP: 138/72 (!) 141/77  Pulse: 75 72  Resp: 16 16  Temp: 36.7 C 36.8 C  SpO2: 100% 100%    Last Pain:  Vitals:   08/14/17 0500  TempSrc: Oral  PainSc:       Patients Stated Pain Goal: 0 (71/16/57 9038)  Complications: No apparent anesthesia complications

## 2017-08-14 NOTE — Op Note (Signed)
[name], [name]               ACCOUNT NO.:  [id number]  MEDICAL RECORD NO.:  [id number]  LOCATION:  [id number]                        FACILITY:  [facility]  PHYSICIAN:  Phylliss Bob, MD      DATE OF BIRTH:  1951/08/02  DATE OF PROCEDURE:  08/14/2017 DATE OF DISCHARGE:                              OPERATIVE REPORT   PREOPERATIVE DIAGNOSES: 1. Status post L3-4 anterior fusion requiring posterior fixation. 2. Status post L4-5 fusion with CT scan and intraoperative findings     suggestive of a possible L4-5 nonunion.  POSTOPERATIVE DIAGNOSES: 1. Status post L3-4 anterior fusion requiring posterior fixation. 2. Status post L4-5 fusion with CT scan and intraoperative findings     suggestive of a possible L4-5 nonunion.  PROCEDURES: 1. Posterior spinal fusion at L3-4, L4-5. 2. Placement of posterior segmental instrumentation at L3, L4, L5,     bilateral, with removal of the patient's previously placed L4 and     L5 instrumentation. 3. Use of local autograft. 4. Use of morselized allograft - ViviGen. 5. Intraoperative use of fluoroscopy.  SURGEON:  Phylliss Bob, MD.  ASSISTANTPricilla Holm, PA-C.  ANESTHESIA:  General endotracheal anesthesia.  COMPLICATIONS:  None.  DISPOSITION:  Stable.  ESTIMATED BLOOD LOSS:  200 mL.  INDICATIONS FOR SURGERY:  Briefly, [name] is a pleasant 66 year old female who is status post a previous L4-5 decompression and fusion.  She did very well from that surgery, but did ultimately have recurrence of pain.  An updated MRI did reveal adjacent segment disease and spinal stenosis at L3-4, correlating to her symptoms.  A CAT scan did also suggest the possibility of a nonunion at L4-5.  Given the patient's ongoing pain and dysfunction, we did discuss proceeding with yesterday's procedure, an anterior lumbar interbody fusion via lateral approach, to be followed by today's procedure.  Please refer to the operative report dated May 13, 2017, for  additional details.  OPERATIVE DETAILS:  On August 14, 2017, the patient was brought to Surgery and general endotracheal anesthesia was administered.  The patient was placed prone on a well-padded flat Jackson bed with a spinal frame.  All bony prominences were meticulously padded.  The back was prepped and draped in the usual sterile fashion.  At this point, a time- out procedure was performed.  The patient's previous incision was re- utilized and extended more superiorly.  Dissection was carried down to the fascia, and the fascia was incised at the midline.  The paraspinal musculature was bluntly swept laterally.  The patient's previously placed L4 and L5 caps and rods were removed.  The L5 screws were removed.  The L4 screws were then removed and they were noted to be slightly loose.  Prior to removing the screws, I did perform a pushing and pulling maneuver across the L4-5 level, grabbing hold of each of the screws, and there was slight motion identified, which was suggestive of a possible nonunion at L4-5.  At this point, I did elect to proceed with a posterior spinal fusion at this level as well.  Using a high-speed bur, after exposing the posterolateral gutter at L3-4 and L4-5, the transverse processes were decorticated, as were the  facet joints.  The wound was then copiously irrigated.  The autograft for removal of the facet hypertrophy, as well as allograft in the form of ViviGen, was packed into the posterolateral gutter and posterior elements on the right side across the L3-4 and L4-5.  At this point, using anatomic landmarks in addition to intraoperative fluoroscopy, I did cannulate the L3, L4, and L5 pedicles using a lateral to medial standard pedicle screw technique.  New screws were then placed at L3 bilaterally, and revision screws were placed at L4 and at L5 bilaterally.  Specifically, these were 40 mm in length, 7 mm in diameter at L4 and at L5, and 6 mm in diameter at  L3.  I was pleased with the press-fit of the screws and the screw placement noted on intraoperative fluoroscopy.  I then secured an interconnecting rod across the L3, L4, and L5 pedicles.  Caps were then placed and a final locking procedure was performed.  I was very pleased with the final construct on the AP and lateral fluoroscopic images.  At this point, the wound was closed in layers using #1 Vicryl followed by 0 Vicryl followed by 2-0 Vicryl, followed by 4-0 Monocryl.  Benzoin and Steri-Strips were applied followed by sterile dressing.  All instrument counts were correct at the termination of the procedure.  Of note, Pricilla Holm was my assistant throughout surgery, and did aid in retraction, suctioning, and closure.  Of note, there was no abnormal sustained EMG activity noted throughout the entire surgery.     Phylliss Bob, MD     MD/MEDQ  D:  08/14/2017  T:  08/14/2017  Job:  [id number]

## 2017-08-14 NOTE — Anesthesia Preprocedure Evaluation (Signed)
Anesthesia Evaluation  Patient identified by MRN, date of birth, ID band Patient awake    Reviewed: Allergy & Precautions, H&P , NPO status , Patient's Chart, lab work & pertinent test results  History of Anesthesia Complications Negative for: history of anesthetic complications  Airway Mallampati: II  TM Distance: >3 FB Neck ROM: full    Dental  (+) Dental Advisory Given, Edentulous Upper, Edentulous Lower   Pulmonary former smoker,    Pulmonary exam normal breath sounds clear to auscultation       Cardiovascular hypertension, Pt. on medications and Pt. on home beta blockers + CAD  Normal cardiovascular exam Rhythm:regular Rate:Normal  ECG: NSR, rate 92   Nuclear stress test 07/23/17: IMPRESSION: 1. No reversible ischemia or infarction. 2. Normal left ventricular wall motion. 3. Left ventricular ejection fraction 69% 4. Non invasive risk stratification*: Low  Cardiologist is Dr. Terrence Dupont, last visit 02/12/16. Records pending, but with recent non-ischemic stress test, EF 69%.    Neuro/Psych  Headaches, Anxiety Depression    GI/Hepatic Neg liver ROS, hiatal hernia, PUD, GERD  Medicated and Controlled,  Endo/Other  diabetes, Type 2, Insulin DependentHypothyroidism Morbid obesity  Renal/GU Renal disease     Musculoskeletal  (+) Arthritis , Osteoarthritis,    Abdominal (+) + obese,   Peds  Hematology  (+) anemia ,   Anesthesia Other Findings HLD Bilateral leg pain   Reproductive/Obstetrics                             Anesthesia Physical Anesthesia Plan  ASA: III  Anesthesia Plan: General   Post-op Pain Management:    Induction: Intravenous  PONV Risk Score and Plan: 4 or greater and Treatment may vary due to age or medical condition, Dexamethasone and Ondansetron  Airway Management Planned: Oral ETT  Additional Equipment:   Intra-op Plan:   Post-operative Plan: Extubation  in OR and Possible Post-op intubation/ventilation  Informed Consent: I have reviewed the patients History and Physical, chart, labs and discussed the procedure including the risks, benefits and alternatives for the proposed anesthesia with the patient or authorized representative who has indicated his/her understanding and acceptance.   Dental advisory given  Plan Discussed with: CRNA  Anesthesia Plan Comments:         Anesthesia Quick Evaluation

## 2017-08-14 NOTE — Anesthesia Procedure Notes (Signed)
Procedure Name: Intubation Date/Time: 08/14/2017 7:46 AM Performed by: Lance Coon, CRNA Pre-anesthesia Checklist: Patient identified, Emergency Drugs available, Suction available, Patient being monitored and Timeout performed Patient Re-evaluated:Patient Re-evaluated prior to induction Oxygen Delivery Method: Circle system utilized Preoxygenation: Pre-oxygenation with 100% oxygen Induction Type: IV induction Ventilation: Mask ventilation without difficulty Laryngoscope Size: Zegers and 3 Grade View: Grade I Tube type: Oral Tube size: 7.0 mm Number of attempts: 1 Airway Equipment and Method: Stylet Placement Confirmation: ETT inserted through vocal cords under direct vision,  positive ETCO2 and breath sounds checked- equal and bilateral Secured at: 21 cm Tube secured with: Tape Dental Injury: Teeth and Oropharynx as per pre-operative assessment

## 2017-08-14 NOTE — Progress Notes (Signed)
Transport took patient to OR.

## 2017-08-14 NOTE — H&P (Signed)
Patient tolerated yesterday's procedure well, and presents today for stage 2 (PSF L3-L5). Will proceed as planned.

## 2017-08-14 NOTE — Progress Notes (Signed)
Patient requested ambien to sleep. Upon entering room, patient is sleeping. Returned valium and ambien back to stock in pyxis.

## 2017-08-14 NOTE — Anesthesia Postprocedure Evaluation (Signed)
Anesthesia Post Note  Patient: [name]  Procedure(s) Performed: LUMBAR 3-4, LUMBAR 4-5 POSTERIOR SPINAL FUSION WITH INSTRUMENTATION AND ALLOGRAFT; REQUEST 3.5 HOURS (N/A Spine Lumbar)     Patient location during evaluation: PACU Anesthesia Type: General Level of consciousness: awake and sedated Pain management: pain level controlled Vital Signs Assessment: post-procedure vital signs reviewed and stable Respiratory status: spontaneous breathing, nonlabored ventilation, respiratory function stable and patient connected to nasal cannula oxygen Cardiovascular status: blood pressure returned to baseline and stable Postop Assessment: no apparent nausea or vomiting Anesthetic complications: no    Last Vitals:  Vitals:   08/14/17 1334 08/14/17 1343  BP: (!) 153/63 (!) 153/63  Pulse: 92 95  Resp: 10 12  Temp: (!) 36.3 C (!) 36.3 C  SpO2: 93% 97%    Last Pain:  Vitals:   08/14/17 1343  TempSrc:   PainSc: Asleep    LLE Motor Response: Purposeful movement (08/14/17 1343) LLE Sensation: Full sensation (08/14/17 1343) RLE Motor Response: Purposeful movement (08/14/17 1343) RLE Sensation: Full sensation (08/14/17 1343)      Jaeven Wanzer,JAMES TERRILL

## 2017-08-15 LAB — GLUCOSE, CAPILLARY
Glucose-Capillary: 201 mg/dL — ABNORMAL HIGH (ref 65–99)
Glucose-Capillary: 229 mg/dL — ABNORMAL HIGH (ref 65–99)
Glucose-Capillary: 188 mg/dL — ABNORMAL HIGH (ref 65–99)
Glucose-Capillary: 167 mg/dL — ABNORMAL HIGH (ref 65–99)

## 2017-08-15 LAB — POCT I-STAT 4, (NA,K, GLUC, HGB,HCT)
Glucose, Bld: 132 mg/dL — ABNORMAL HIGH (ref 65–99)
HCT: 27 % — ABNORMAL LOW (ref 36.0–46.0)
Hemoglobin: 9.2 g/dL — ABNORMAL LOW (ref 12.0–15.0)
Potassium: 6.1 mmol/L — ABNORMAL HIGH (ref 3.5–5.1)
Sodium: 141 mmol/L (ref 135–145)

## 2017-08-15 MED FILL — Sodium Chloride IV Soln 0.9%: INTRAVENOUS | Qty: 1000 | Status: AC

## 2017-08-15 MED FILL — Heparin Sodium (Porcine) Inj 1000 Unit/ML: INTRAMUSCULAR | Qty: 30 | Status: AC

## 2017-08-15 NOTE — Progress Notes (Signed)
PT Cancellation Note  Patient Details Name: [name] MRN: [id number] DOB: 04-08-1951   Cancelled Treatment:    Reason Eval/Treat Not Completed: Other (comment) Pt requesting to eat lunch prior to session. Will check back as schedule allows.   Leighton Ruff, PT, DPT  Acute Rehabilitation Services  Pager: [phone]    Rudean Hitt 08/15/2017, 12:15 PM

## 2017-08-15 NOTE — Consult Note (Signed)
   [facility] CM Inpatient Consult   08/15/2017  [name] 11-23-1950 [id number]     Spoke with inpatient RNCM to discuss disposition plans.  Therapy recommends SNF. Made aware that family wants to take patient home. [place] [facility] program was introduced.  Cory with [facility] First program reports patient and family has accepted [place].   Will notify [place] Management office.  Patient was followed by [facility] team only prior to admission. [place] [facility] can continue to follow while on [place] program. Will not make referral to [place] after all.   [name], MSN-Ed, RN,BSN [facility] Liaison [phone]

## 2017-08-15 NOTE — Evaluation (Signed)
Physical Therapy Evaluation Patient Details Name: [name] MRN: [id number] DOB: 01-22-1951 Today's Date: 08/15/2017   History of Present Illness  Pt is a 66 y/o female s/p L3-4 ALIF and L4-5 PLIF. PMH CKD, HTN, CAD, anxiety, DM, back surgery, L TKA, L rotator cuff repair.   Clinical Impression  Pt is s/p surgery above with deficits below. PTA, pt was using cane for ambulation. Upon eval, pt very sleepy and very resistive to therapy. Required total assist to perform bed mobility and to come up to sitting. Required 5 attempts to sit at EOB as pt was very resistive. Once sitting, pt with posterior lean and required max A to maintain sitting. Husband tried to encourage pt to participate, however, pt was not wanting to. Required total A +2 to return to supine. Recommending SNF at d/c to increase independence and safety with functional mobility. Husband would prefer to go home at d/c if able. If pt goes home will need max HH services. Will continue to follow acutely to maximize functional mobility independence and safety.     Follow Up Recommendations SNF;Supervision/Assistance - 24 hour    Equipment Recommendations  Rolling walker with 5" wheels;3in1 (PT)(bariatric )    Recommendations for Other Services       Precautions / Restrictions Precautions Precautions: Back Precaution Booklet Issued: Yes (comment) Precaution Comments: Reviewed back precautions with pt and family. Pt very sleepy so will need review.  Required Braces or Orthoses: Spinal Brace Spinal Brace: Thoracolumbosacral orthotic;Applied in sitting position Restrictions Weight Bearing Restrictions: No      Mobility  Bed Mobility Overal bed mobility: Needs Assistance Bed Mobility: Sidelying to Sit;Rolling;Sit to Sidelying Rolling: Total assist Sidelying to sit: Total assist;HOB elevated     Sit to sidelying: Total assist;+2 for physical assistance General bed mobility comments: Pt very resistive and not wanting to  help with mobility, so required total assist to sit at EOB. Attempted X 5 for trunk elevation before pt would sit upright. Pt would push against PT when trying to assist.  Pt with posterior lean upon sitting and unable to hold balance. Sat EOB for ~10 min working on sitting balance. Husband trying to encourage pt to participate as well. Called NT in room to assist with return to sidelying and to readjust in the bed.   Transfers                 General transfer comment: unsafe to attempt this session as pt with posterior lean and very sleepy   Ambulation/Gait                Stairs            Wheelchair Mobility    Modified Rankin (Stroke Patients Only)       Balance Overall balance assessment: Needs assistance Sitting-balance support: Bilateral upper extremity supported;Feet supported Sitting balance-Leahy Scale: Poor Sitting balance - Comments: Reliant on Max A to maintain sitting balance. Pt reports she was in pain in sitting and resistant to sitting upright. Pt also very sleepy in sitting.                                      Pertinent Vitals/Pain Pain Assessment: Faces Faces Pain Scale: Hurts little more Pain Location: back  Pain Descriptors / Indicators: Aching;Operative site guarding    Home Living Family/patient expects to be discharged to:: Private residence Living Arrangements: Spouse/significant other  Available Help at Discharge: Family;Available 24 hours/day Type of Home: House Home Access: Stairs to enter Entrance Stairs-Rails: Right Entrance Stairs-Number of Steps: 3 Home Layout: One level Home Equipment: Cane - single point      Prior Function Level of Independence: Independent with assistive device(s)         Comments: Reports she used cane for ambulation      Hand Dominance        Extremity/Trunk Assessment   Upper Extremity Assessment Upper Extremity Assessment: Defer to OT evaluation    Lower Extremity  Assessment Lower Extremity Assessment: Generalized weakness    Cervical / Trunk Assessment Cervical / Trunk Assessment: Other exceptions Cervical / Trunk Exceptions: s/p fusion   Communication   Communication: No difficulties  Cognition Arousal/Alertness: Lethargic;Suspect due to medications Behavior During Therapy: Flat affect Overall Cognitive Status: Difficult to assess                                 General Comments: Pt very sleepy and very resistive with mobility this session.       General Comments General comments (skin integrity, edema, etc.): Pt's husband present during session. Educated about need for SNF at d/c, however, husband would prefer to go home at d/c if able. Educated to continue to encourage pt to get out of bed, as main form of exercise is walking.     Exercises     Assessment/Plan    PT Assessment Patient needs continued PT services  PT Problem List Decreased strength;Decreased activity tolerance;Decreased balance;Decreased mobility;Decreased cognition;Decreased knowledge of use of DME;Decreased safety awareness;Decreased knowledge of precautions;Pain       PT Treatment Interventions DME instruction;Gait training;Stair training;Functional mobility training;Therapeutic activities;Balance training;Therapeutic exercise;Neuromuscular re-education;Patient/family education    PT Goals (Current goals can be found in the Care Plan section)  Acute Rehab PT Goals Patient Stated Goal: to go home per husband  PT Goal Formulation: With family Time For Goal Achievement: 08/29/17 Potential to Achieve Goals: Fair    Frequency Min 5X/week   Barriers to discharge        Co-evaluation               AM-PAC PT "6 Clicks" Daily Activity  Outcome Measure Difficulty turning over in bed (including adjusting bedclothes, sheets and blankets)?: Unable Difficulty moving from lying on back to sitting on the side of the bed? : Unable Difficulty sitting  down on and standing up from a chair with arms (e.g., wheelchair, bedside commode, etc,.)?: Unable Help needed moving to and from a bed to chair (including a wheelchair)?: Total Help needed walking in hospital room?: Total Help needed climbing 3-5 steps with a railing? : Total 6 Click Score: 6    End of Session   Activity Tolerance: Patient limited by lethargy;Patient limited by pain Patient left: in bed;with call bell/phone within reach;with family/visitor present Nurse Communication: Mobility status PT Visit Diagnosis: Other abnormalities of gait and mobility (R26.89);Unsteadiness on feet (R26.81);Muscle weakness (generalized) (M62.81);Pain Pain - part of body: (back )    Time: 3329-5188 PT Time Calculation (min) (ACUTE ONLY): 29 min   Charges:   PT Evaluation $PT Eval Moderate Complexity: 1 Mod PT Treatments $Therapeutic Activity: 8-22 mins   PT G Codes:        Leighton Ruff, PT, DPT  Acute Rehabilitation Services  Pager: [phone]   Rudean Hitt 08/15/2017, 2:25 PM

## 2017-08-15 NOTE — Progress Notes (Signed)
[facility] returned call.  Spoke to [facility] regarding, what appears to be, a significant yeast infection. She gave a verbal order for Diflucan, however, when I spoke to the patient she was not sure she wanted to take Diflucan. I placed a sticky note on the front of the chart for the doctor to review. Report given to Linna Hoff, who is the RN for the day.

## 2017-08-15 NOTE — Care Management Note (Signed)
Case Management Note  Patient Details  Name: [name] MRN: [id number] Date of Birth: [birth year]  Subjective/Objective:      Pt underwent:    LEFT SIDED LUMBAR 3-4 LATERAL INTERBODY FUSION WITH INSTRUMENTATION AND ALLOGRAFT  On 11/7 and  LUMBAR 3-4, LUMBAR 4-5 POSTERIOR SPINAL FUSION WITH INSTRUMENTATION AND ALLOGRAFT on 11/8. She is from home with her spouse.          Action/Plan: PT recommending SNF. CM met with the patient and her spouse. Pt very lethargic and in and out of sleep during meeting. CM inquired about plans at d/c. Pts husband wants her to d/c home. He is willing for her to go to rehab if absolutely needed. He also ultimately wants her to make the decision. CM asked about faxing her out in [place] but also about the [facility] program. Mr Syverson gave consent to fax the patient out to [facility] facilities and to contact [place] to look into Home First.  CSW updated. CM called and notified Tommi Rumps with [facility] and he will see if the patient is a good candidate for their program. CM following.  Expected Discharge Date:  08/15/17               Expected Discharge Plan:     In-House Referral:     Discharge planning Services     Post Acute Care Choice:    Choice offered to:     DME Arranged:    DME Agency:     HH Arranged:    HH Agency:     Status of Service:  In process, will continue to follow  If discussed at Long Length of Stay Meetings, dates discussed:    Additional Comments:  Pollie Friar, RN 08/15/2017, 3:37 PM

## 2017-08-15 NOTE — Consult Note (Signed)
   [facility] CM Inpatient Consult   08/15/2017  [name] 06-20-1951 [id number]     [name] has been followed by [facility] team. However, it appears [name] has been difficult to reach.  Spoke with [name] and family at bedside. [name] daughter was feeding her lunch.   Discussed [facility] Care Management program services. [name] endorses that she received pharmacy paperwork from [place] team but has not yet looked over it.  Husband states patient needs assistance with medication. Made [name] aware that [place] is currently active and is following for pharmacy needs and assistance. Stressed the importance of their engagement with [facility] Management team.  Confirmed best contact number as [phone].  Obtained written consent for [facility] Care Management program ongoing. Also discussed referral to be made for [facility] for DM management. Both husband and wife report additional education for diabetes is needed.  [facility] Care Management packet and contact information provided.  Spoke with inpatient RNCM to make aware of above notes and that [facility] Management was active prior to admission with [facility] team.  Therapy evaluation is pending. Will make referral to [facility] once disposition plans are known.   [name], MSN-Ed, RN,BSN [facility] Liaison [phone]

## 2017-08-15 NOTE — Progress Notes (Signed)
Paged Dr Lynann Bologna Re: yeast seen when patient In & Out Catheter. Awaiting call back.

## 2017-08-15 NOTE — Progress Notes (Signed)
Patients temp 101.4. Has not voided. Gave tylenol 650. Bladder scanned and it showed 431. Lungs sound clear. Encourage more fluids.

## 2017-08-15 NOTE — Progress Notes (Signed)
    Patient doing well PO Day 2/1 S/P Lat/Post lumbar fusion. Leg pain continues to be improved, pt continues to express "severe" LBP however appears very comfortable. Nursing staff reports pt continues to ask for meds and report pain however when they come back she is asleep. They have also commented on pt having excessive yeast when they I/O cath her last night however pt declined the recommended diflucan. She reports she has not yet been out of the bed or walked, she is anxious about PT. She has not yet voided on her own, she admits to not eating or drinking much. She is passing gas. Pain is described as being in the LB    Physical Exam: BP (!) 112/50 (BP Location: Right Arm)   Pulse (!) 117   Temp 100.2 F (37.9 C) (Axillary)   Resp 18   Ht 5\' 6"  (1.676 m)   Wt 124.3 kg (274 lb)   SpO2 96%   BMI 44.22 kg/m   Dressing in place, CDI lateral and Post, pt resting very comfortable in the room and was asleep upon entering the room, SCD's in place, NVI  POD #2/1 s/p LAT/POST fusion with improved leg pain and expected PO LBP  - up with PT/OT, encourage ambulation  -Pt educated on the necessity of PT and the importance of getting up and ambulating regularly, she voiced understanding - Pt needs to be walked 1-2x per shift by nursing staff - Pt has poor pain tolerance, she frequently reports 8-10/10 pain but continues to look comfortable and rest/sleep easily. Staff will continue to monitor pain and need for medications closely  - Percocet for pain, Valium for muscle spasms  - Likely yeast infection, pt deferred diflucan, encouraged to f/u with PCP - likely d/c home today vs tomorrow pending PT/OT

## 2017-08-16 LAB — GLUCOSE, CAPILLARY
Glucose-Capillary: 199 mg/dL — ABNORMAL HIGH (ref 65–99)
Glucose-Capillary: 215 mg/dL — ABNORMAL HIGH (ref 65–99)
Glucose-Capillary: 189 mg/dL — ABNORMAL HIGH (ref 65–99)
Glucose-Capillary: 215 mg/dL — ABNORMAL HIGH (ref 65–99)

## 2017-08-16 MED ORDER — PANTOPRAZOLE SODIUM 40 MG PO TBEC
40.0000 mg | DELAYED_RELEASE_TABLET | Freq: Every day | ORAL | Status: DC
  Administered 2017-08-16 – 2017-08-19 (×4): 40 mg via ORAL
  Filled 2017-08-16 (×3): qty 1

## 2017-08-16 NOTE — Progress Notes (Signed)
Patient requested bedpan. Positive for voiding, however unmeasurable due to patient missing bedpan. Sheet soaked, 50cc in bedpan. Tolerated log roll well. Continue to monitor.

## 2017-08-16 NOTE — Progress Notes (Signed)
Patient requested 1 percocet for pain. When I entered the room to give percocet, patient sound asleep. Returned percocet to pyxis.

## 2017-08-16 NOTE — Progress Notes (Signed)
Patient's temp. 101.3, tylenol given. Incentive spirhometer encouraged. She is achieving 1200.

## 2017-08-16 NOTE — Progress Notes (Signed)
Physical Therapy Treatment Patient Details Name: [name] MRN: [id number] DOB: 1951/01/19 Today's Date: 08/16/2017    History of Present Illness Pt is a 66 y/o female s/p L3-4 ALIF and L4-5 PLIF. PMH CKD, HTN, CAD, anxiety, DM, back surgery, L TKA, L rotator cuff repair.     PT Comments    Pt currently limited by pain, lethargy, and dizziness. Pt reported dizziness in seated potion and when returning to side laying. Pt motioned with hand that the room was spinning when transitioning from EOB to side laying. May need vestibular eval. Vitals taken after pt returned to supine, BP 122/48, 106 BPM, SpO2 97. Pt would benefit from continued skilled PT to increase functional independence and safety with mobility. Family would like to pt to return home at d/c, however SNF continues to be appropriate. Will continue to follow acutely.     Follow Up Recommendations  SNF;Supervision/Assistance - 24 hour     Equipment Recommendations  Rolling walker with 5" wheels;3in1 (PT)(bariatric )    Recommendations for Other Services       Precautions / Restrictions Precautions Precautions: Back Precaution Booklet Issued: Yes (comment) Precaution Comments: Reviewed back precautions with pt and family. Pt very sleepy so will need review.  Required Braces or Orthoses: Spinal Brace Spinal Brace: Thoracolumbosacral orthotic;Applied in sitting position Restrictions Weight Bearing Restrictions: No    Mobility  Bed Mobility Overal bed mobility: Needs Assistance Bed Mobility: Sidelying to Sit;Rolling;Sit to Sidelying Rolling: Max assist Sidelying to sit: Max assist;HOB elevated;+2 for physical assistance     Sit to sidelying: Total assist;+2 for physical assistance General bed mobility comments: Pt required max assist to come EOB and max cueing for technique. Pt with posterior lean sitting EOB and c/o dizziness. Pt resistive to returning to sidelaying secondary to pain, requiring total assist for  return to bed.  Transfers Overall transfer level: Needs assistance Equipment used: Rolling walker (2 wheeled) Transfers: Sit to/from Stand Sit to Stand: Max assist;+2 physical assistance         General transfer comment: Max A +2 to rise into standing, Pt unable to come fully upright. Hips, knees, and trunk flexed despite cueing and physical assist.   Ambulation/Gait                 Stairs            Wheelchair Mobility    Modified Rankin (Stroke Patients Only)       Balance Overall balance assessment: Needs assistance Sitting-balance support: Bilateral upper extremity supported;Feet supported Sitting balance-Leahy Scale: Poor Sitting balance - Comments: Pt able to sit EOB for ~15 min. Pt required mod A secondary to posterior lean. After standing, pt able to support self at EOB with BIL UE resting on RW. Postural control: Posterior lean                                  Cognition Arousal/Alertness: Lethargic;Suspect due to medications Behavior During Therapy: Flat affect Overall Cognitive Status: Difficult to assess                                 General Comments: lethargic and flat effect, not very responsive to questions      Exercises      General Comments General comments (skin integrity, edema, etc.): Pt husband present during session and continues to express desire  for pt to return home after d/c.       Pertinent Vitals/Pain Pain Assessment: Faces Faces Pain Scale: Hurts whole lot Pain Location: back  Pain Descriptors / Indicators: Aching;Operative site guarding Pain Intervention(s): Monitored during session;Limited activity within patient's tolerance;Premedicated before session    Home Living                      Prior Function            PT Goals (current goals can now be found in the care plan section) Acute Rehab PT Goals Patient Stated Goal: to go home per husband  PT Goal Formulation: With  family Time For Goal Achievement: 08/29/17 Potential to Achieve Goals: Fair Progress towards PT goals: Progressing toward goals(slowly, limited by pain and lethargy)    Frequency    Min 5X/week      PT Plan Current plan remains appropriate    Co-evaluation PT/OT/SLP Co-Evaluation/Treatment: Yes Reason for Co-Treatment: Complexity of the patient's impairments (multi-system involvement);For patient/therapist safety PT goals addressed during session: Mobility/safety with mobility;Balance;Proper use of DME OT goals addressed during session: ADL's and self-care;Proper use of Adaptive equipment and DME      AM-PAC PT "6 Clicks" Daily Activity  Outcome Measure  Difficulty turning over in bed (including adjusting bedclothes, sheets and blankets)?: Unable Difficulty moving from lying on back to sitting on the side of the bed? : Unable Difficulty sitting down on and standing up from a chair with arms (e.g., wheelchair, bedside commode, etc,.)?: Unable Help needed moving to and from a bed to chair (including a wheelchair)?: Total Help needed walking in hospital room?: Total Help needed climbing 3-5 steps with a railing? : Total 6 Click Score: 6    End of Session Equipment Utilized During Treatment: Gait belt Activity Tolerance: Patient limited by lethargy;Patient limited by pain Patient left: in bed;with call bell/phone within reach;with family/visitor present Nurse Communication: Mobility status PT Visit Diagnosis: Other abnormalities of gait and mobility (R26.89);Unsteadiness on feet (R26.81);Muscle weakness (generalized) (M62.81);Pain Pain - part of body: (back )     Time: 3729-0211 PT Time Calculation (min) (ACUTE ONLY): 30 min  Charges:  $Therapeutic Activity: 8-22 mins                    G Codes:       Benjiman Core, [place] Pager [phone] Acute Rehab   Allena Katz 08/16/2017, 11:06 AM

## 2017-08-16 NOTE — Progress Notes (Signed)
PATIENT ID: [name]  MRN: [id number]  DOB/AGE:  67-Jul-1952 / 67 y.o.  2 Days Post-Op Procedure(s) (LRB): LUMBAR 3-4, LUMBAR 4-5 POSTERIOR SPINAL FUSION WITH INSTRUMENTATION AND ALLOGRAFT; REQUEST 3.5 HOURS (N/A)    PROGRESS NOTE Subjective:   Patient is alert, oriented, no Nausea, no Vomiting, no Bowel Movement. Taking PO well. Denies SOB, Chest or Calf Pain. Using Incentive Spirometer, PAS in place. Ambulate WBAT with assistance and TLSO brace , Patient reports pain as moderate,     Objective: Vital signs in last 24 hours: Temp:  [99.6 F (37.6 C)-101.3 F (38.5 C)] 100.3 F (37.9 C) (11/10 0536) Pulse Rate:  [86-117] 107 (11/10 0536) Resp:  [18] 18 (11/10 0536) BP: (90-127)/(44-62) 127/53 (11/10 0536) SpO2:  [96 %-100 %] 97 % (11/10 0536)    Intake/Output from previous day: I/O last 3 completed shifts: In: 780 [P.O.:780] Out: 350 [Urine:350]   Intake/Output this shift: No intake/output data recorded.   LABORATORY DATA: Recent Labs    08/14/17 0709  08/15/17 1644 08/15/17 2159 08/16/17 0637  HGB 9.2*  --   --   --   --   HCT 27.0*  --   --   --   --   NA 141  --   --   --   --   K 6.1*  --   --   --   --   GLUCOSE 132*  --   --   --   --   GLUCAP  --    < > 188* 167* 199*   < > = values in this interval not displayed.    Examination: Neurologically intact Neurovascular intact Sensation intact distally Intact pulses distally Dorsiflexion/Plantar flexion intact Incision: no drainage}  Assessment:   2 Days Post-Op Procedure(s) (LRB): LUMBAR 3-4, LUMBAR 4-5 POSTERIOR SPINAL FUSION WITH INSTRUMENTATION AND ALLOGRAFT; REQUEST 3.5 HOURS (N/A) ADDITIONAL DIAGNOSIS:   Plan:   - up with PT/OT, encourage ambulation             -Pt educated on the necessity of PT and the importance of getting up and ambulating regularly, she voiced understanding - Pt needs to be walked 1-2x per shift by nursing staff - Staff will continue to monitor pain and need for  medications closely  - Percocet for pain, Valium for muscle spasms  - Likely yeast infection, pt deferred diflucan, encouraged to f/u with PCP - likely d/c home today vs tomorrow pending PT/OT        [name] R 08/16/2017, 8:31 AM

## 2017-08-16 NOTE — Progress Notes (Signed)
PTAR forms printed and given to nurse, please call [phone] when ready to DC patient.

## 2017-08-16 NOTE — Care Management Note (Addendum)
Case Management Note Previous Note Created by Jacqualin Combes  Patient Details  Name: [name] MRN: [id number] Date of Birth: 05-14-1951  Subjective/Objective:      Pt underwent:    LEFT SIDED LUMBAR 3-4 LATERAL INTERBODY FUSION WITH INSTRUMENTATION AND ALLOGRAFT  On 11/7 and  LUMBAR 3-4, LUMBAR 4-5 POSTERIOR SPINAL FUSION WITH INSTRUMENTATION AND ALLOGRAFT on 11/8. She is from home with her spouse.          Action/Plan: PT recommending SNF. CM met with the patient and her spouse. Pt very lethargic and in and out of sleep during meeting. CM inquired about plans at d/c. Pts husband wants her to d/c home. He is willing for her to go to rehab if absolutely needed. He also ultimately wants her to make the decision. CM asked about faxing her out in [place] but also about the [facility] program. Mr Kapaun gave consent to fax the patient out to [facility] facilities and to contact [facility] to look into [facility].  CSW updated. CM called and notified Tommi Rumps with [facility] and he will see if the patient is a good candidate for their program. CM following.  Expected Discharge Date:  08/15/17               Expected Discharge Plan:  [facility]  In-House Referral:     Discharge planning Services  CM Consult  Post Acute Care Choice:    Choice offered to:     DME Arranged:  3-N-1, Walker rolling DME Agency:  [place] Arranged:  PT, OT [facility] Agency:  [facility] [place]  Status of Service:  In process, will continue to follow  If discussed at Long Length of Stay Meetings, dates discussed:    Additional Comments: 08/16/2017 Pt to discharge home today in care of husband.  CM confirmed that family/pt are still interested in [facility].  Therapy has re-evaluated pt this am and continue to recommend SNF but are aware that pt/family have declined.  CM offered choice for recommended DME - [facility] chosen - agency contacted and referral accepted.   [facility] informed of discharge home today - agency confirmed acceptance in program.  Pt will discharge home via private vehicle driven by husband.  CM asked for [facility] orders and face to face Maryclare Labrador, RN 08/16/2017, 11:51 AM

## 2017-08-16 NOTE — Evaluation (Signed)
Occupational Therapy Evaluation Patient Details Name: [name] MRN: [id number] DOB: 12/24/1950 Today's Date: 08/16/2017    History of Present Illness Pt is a 66 y/o female s/p L3-4 ALIF and L4-5 PLIF. PMH CKD, HTN, CAD, anxiety, DM, back surgery, L TKA, L rotator cuff repair.    Clinical Impression   Pt admitted with the above diagnoses and presents with below problem list. Pt will benefit from continued acute OT to address the below listed deficits and maximize independence with basic ADLs prior to d/c to next venue. PTA pt was mod I with ADLs. Pt is currently max +2 physical assist for bed mobility, max +2 to mod A with sitting balance, max +2 for partial stand this session. Pt is currently needing +2 assist for most ADLs and bed mobility.        Follow Up Recommendations  SNF    Equipment Recommendations  Other (comment)(defer to next venue)    Recommendations for Other Services       Precautions / Restrictions Precautions Precautions: Back Precaution Booklet Issued: Yes (comment) Precaution Comments: Reviewed back precautions with pt and family. Pt very sleepy so will need review.  Required Braces or Orthoses: Spinal Brace Spinal Brace: Thoracolumbosacral orthotic;Applied in sitting position Restrictions Weight Bearing Restrictions: No      Mobility Bed Mobility Overal bed mobility: Needs Assistance Bed Mobility: Sidelying to Sit;Rolling;Sit to Sidelying Rolling: Max assist Sidelying to sit: Max assist;HOB elevated;+2 for physical assistance     Sit to sidelying: Total assist;+2 for physical assistance General bed mobility comments: Pt required max assist to come EOB and max cueing for technique. Pt with posterior lean sitting EOB and c/o dizziness. Pt resistive to returning to sidelaying secondary to pain, requiring total assist for return to bed.  Transfers Overall transfer level: Needs assistance Equipment used: Rolling walker (2 wheeled) Transfers: Sit  to/from Stand Sit to Stand: Max assist;+2 physical assistance         General transfer comment: Max A +2 to rise into standing, Pt unable to come fully upright. Hips, knees, and trunk flexed despite cueing and physical assist. Utilized bed pad during powerup to try to facilitate hip extension.    Balance Overall balance assessment: Needs assistance Sitting-balance support: Bilateral upper extremity supported;Feet supported Sitting balance-Leahy Scale: Poor Sitting balance - Comments: Pt able to sit EOB for ~15 min. Pt required mod A secondary to posterior lean. After standing, pt able to support self at EOB with BIL UE resting on RW. Postural control: Posterior lean                                 ADL either performed or assessed with clinical judgement   ADL Overall ADL's : Needs assistance/impaired Eating/Feeding: Set up;Bed level   Grooming: Set up;Bed level   Upper Body Bathing: Maximal assistance;Sitting Upper Body Bathing Details (indicate cue type and reason): poor sitting balance Lower Body Bathing: Maximal assistance;+2 for physical assistance;Sitting/lateral leans Lower Body Bathing Details (indicate cue type and reason): partial stand with max +2 assist Upper Body Dressing : Maximal assistance;Sitting Upper Body Dressing Details (indicate cue type and reason): poor sitting balance Lower Body Dressing: Maximal assistance;+2 for physical assistance;Sitting/lateral leans                 General ADL Comments: Pt completed bed mobility, sat EOB, and completed 1x partial sit<>stand as detailed above.      Vision  Perception     Praxis      Pertinent Vitals/Pain Pain Assessment: Faces Faces Pain Scale: Hurts whole lot Pain Location: back  Pain Descriptors / Indicators: Aching;Operative site guarding Pain Intervention(s): Monitored during session;Limited activity within patient's tolerance;Repositioned     Hand Dominance Right    Extremity/Trunk Assessment Upper Extremity Assessment Upper Extremity Assessment: Generalized weakness   Lower Extremity Assessment Lower Extremity Assessment: Defer to PT evaluation;Generalized weakness   Cervical / Trunk Assessment Cervical / Trunk Assessment: Other exceptions Cervical / Trunk Exceptions: s/p fusion    Communication Communication Communication: No difficulties   Cognition Arousal/Alertness: Lethargic;Suspect due to medications Behavior During Therapy: Flat affect Overall Cognitive Status: Difficult to assess                                 General Comments: lethargic and flat effect, not very responsive to questions   General Comments  Pt husband present during session and continues to express desire for pt to return home after d/c.      Exercises     Shoulder Instructions      Home Living Family/patient expects to be discharged to:: Private residence Living Arrangements: Spouse/significant other Available Help at Discharge: Family;Available 24 hours/day Type of Home: House Home Access: Stairs to enter [employer] of Steps: 3 Entrance Stairs-Rails: Right Home Layout: One level     Bathroom Shower/Tub: Occupational psychologist: Standard     Home Equipment: Cane - single point          Prior Functioning/Environment Level of Independence: Independent with assistive device(s)        Comments: Reports she used cane for ambulation         OT Problem List: Decreased strength;Decreased activity tolerance;Impaired balance (sitting and/or standing);Decreased knowledge of use of DME or AE;Decreased knowledge of precautions;Pain      OT Treatment/Interventions: Self-care/ADL training;Therapeutic exercise;Energy conservation;DME and/or AE instruction;Therapeutic activities;Patient/family education;Balance training    OT Goals(Current goals can be found in the care plan section) Acute Rehab OT Goals Patient Stated  Goal: to go home per husband  OT Goal Formulation: With patient/family Time For Goal Achievement: 08/30/17 Potential to Achieve Goals: Good ADL Goals Pt Will Perform Grooming: with mod assist;sitting Pt Will Perform Upper Body Bathing: with mod assist;sitting Pt Will Perform Lower Body Bathing: with mod assist Pt Will Perform Upper Body Dressing: with mod assist Pt Will Perform Lower Body Dressing: with mod assist Pt Will Transfer to Toilet: stand pivot transfer;with mod assist;with +2 assist Pt Will Perform Toileting - Clothing Manipulation and hygiene: with mod assist;sit to/from stand Additional ADL Goal #1: Pt will complete bed mobility at min A as precursor to ADLs.  OT Frequency: Min 2X/week   Barriers to D/C:            Co-evaluation PT/OT/SLP Co-Evaluation/Treatment: Yes Reason for Co-Treatment: Complexity of the patient's impairments (multi-system involvement);For patient/therapist safety PT goals addressed during session: Mobility/safety with mobility;Balance;Proper use of DME OT goals addressed during session: ADL's and self-care      AM-PAC PT "6 Clicks" Daily Activity     Outcome Measure Help from another person eating meals?: None Help from another person taking care of personal grooming?: A Little Help from another person toileting, which includes using toliet, bedpan, or urinal?: Total Help from another person bathing (including washing, rinsing, drying)?: Total Help from another person to put on and taking off regular  upper body clothing?: A Lot Help from another person to put on and taking off regular lower body clothing?: Total 6 Click Score: 12   End of Session Equipment Utilized During Treatment: Gait belt;Rolling walker;Back brace Nurse Communication: Other (comment)(+ dizziness during session, vitals assessed at end of sessio)  Activity Tolerance: Patient limited by pain;Patient limited by fatigue Patient left: in bed;with call bell/phone within  reach;with bed alarm set;with SCD's reapplied;with family/visitor present  OT Visit Diagnosis: Unsteadiness on feet (R26.81);Muscle weakness (generalized) (M62.81);Pain                Time: 1836-7255 OT Time Calculation (min): 30 min Charges:  OT General Charges $OT Visit: 1 Visit OT Evaluation $OT Eval Moderate Complexity: 1 Mod G-Codes:      Hortencia Pilar 08/16/2017, 11:36 AM

## 2017-08-17 LAB — GLUCOSE, CAPILLARY
Glucose-Capillary: 173 mg/dL — ABNORMAL HIGH (ref 65–99)
Glucose-Capillary: 220 mg/dL — ABNORMAL HIGH (ref 65–99)
Glucose-Capillary: 302 mg/dL — ABNORMAL HIGH (ref 65–99)
Glucose-Capillary: 220 mg/dL — ABNORMAL HIGH (ref 65–99)

## 2017-08-17 NOTE — Plan of Care (Signed)
Pt progressing

## 2017-08-17 NOTE — Progress Notes (Signed)
PATIENT ID: [name]  MRN: [id number]  DOB/AGE:  1951/05/29 / 66 y.o.  3 Days Post-Op Procedure(s) (LRB): LUMBAR 3-4, LUMBAR 4-5 POSTERIOR SPINAL FUSION WITH INSTRUMENTATION AND ALLOGRAFT; REQUEST 3.5 HOURS (N/A)    PROGRESS NOTE Subjective:   Patient is alert, oriented, no Nausea, no Vomiting, yes passing gas, no Bowel Movement. Taking PO well. Denies SOB, Chest or Calf Pain. Using Incentive Spirometer, PAS in place. Ambulate WBAT with therapy assist, Patient reports pain as moderate and severe,     Objective: Vital signs in last 24 hours: Temp:  [98.1 F (36.7 C)-99.8 F (37.7 C)] 99 F (37.2 C) (11/11 0622) Pulse Rate:  [98-104] 101 (11/11 0622) Resp:  [20] 20 (11/11 0622) BP: (94-121)/(45-52) 106/45 (11/11 0622) SpO2:  [96 %-100 %] 96 % (11/11 0622)    Intake/Output from previous day: I/O last 3 completed shifts: In: 420 [P.O.:420] Out: 1100 [Urine:1100]   Intake/Output this shift: No intake/output data recorded.   LABORATORY DATA: Recent Labs    08/16/17 1709 08/16/17 2214 08/17/17 0619  GLUCAP 189* 215* 173*    Examination: Neurologically intact Neurovascular intact Sensation intact distally Intact pulses distally Dorsiflexion/Plantar flexion intact Incision: dressing C/D/I No cellulitis present Compartment soft}  Assessment:   3 Days Post-Op Procedure(s) (LRB): LUMBAR 3-4, LUMBAR 4-5 POSTERIOR SPINAL FUSION WITH INSTRUMENTATION AND ALLOGRAFT; REQUEST 3.5 HOURS (N/A) ADDITIONAL DIAGNOSIS:  Diabetes and anxiety and depression  Plan:   - up with PT/OT, encourage ambulation -Pt educated on the necessity of PT and the importance of getting up and ambulating regularly, she voiced understanding - Pt needs to be walked 1-2x per shift by nursing staff - Staff will continue to monitor pain and need for medications closely - Percocet for pain, Valium for muscle spasms - Likely yeast infection, pt deferred diflucan, encouraged to f/u with  PCP - likely d/c hometoday vs tomorrow pending PT/OT.  if pt does not make gains, she may need to change D/C plan to SNF       Ozzie Knobel R 08/17/2017, 7:56 AM

## 2017-08-17 NOTE — Progress Notes (Signed)
Occupational Therapy Treatment Patient Details Name: SAVANAH BAYLES MRN: [id number] DOB: 02-21-1951 Today's Date: 08/17/2017    History of present illness Pt is a 66 y/o female s/p L3-4 ALIF and L4-5 PLIF. PMH CKD, HTN, CAD, anxiety, DM, back surgery, L TKA, L rotator cuff repair.    OT comments  Pt demonstrating some progress toward OT goals this session. Session focused on improved functional mobility to increase activity tolerance and as a precursor to improved ADL participation. Pt continues to require max assist +2 for seated UB dressing tasks demonstrating poor seated balance. Encouraged pt to relax shoulders and UB to minimize strain on back while seated. Pt completing multiple attempts at sit<>stand in preparation for toilet transfer. On second attempt with RW, she was able to obtain upright position; however, unable to maintain standing balance or progress with pivotal steps despite max assist +2. Ultimately utilized Stedy to allow pt to pull up to standing position with max assist +2 and she was able to obtain upright position. Unable to engage in standing ADL in [place] frame due to need for B UE support. Continue to feel that SNF level rehabilitation is best recommendation post-acute D/C. Husband remains very vocal about returning home but is agreeable to further rehabilitation if necessary.    Follow Up Recommendations  SNF    Equipment Recommendations  Other (comment)(defer to next venue of care)    Recommendations for Other Services      Precautions / Restrictions Precautions Precautions: Back Precaution Booklet Issued: (previously provided by PT) Precaution Comments: Reviewed back precautions verbally with pt and family. Pt initially with no recall of precautions.  Required Braces or Orthoses: Spinal Brace Spinal Brace: Thoracolumbosacral orthotic;Applied in sitting position Restrictions Weight Bearing Restrictions: No       Mobility Bed Mobility Overal bed mobility:  Needs Assistance Bed Mobility: Sidelying to Sit;Rolling Rolling: Max assist;+2 for physical assistance Sidelying to sit: Max assist;HOB elevated;+2 for physical assistance       General bed mobility comments: Max assist to come to EOB with cues for technique. Pt did not initiate roll.   Transfers Overall transfer level: Needs assistance Equipment used: Rolling walker (2 wheeled) Transfers: Sit to/from Stand Sit to Stand: Max assist;+2 physical assistance         General transfer comment: Max assist +2 to rise into standing. Pt unable to assume fully upright position on first attempt with RW. Improved on second attempt with RW but not steady enough to pivot to chair. Michaelyn Barter for third attempt and pt improved with ability to pull up on bar.     Balance Overall balance assessment: Needs assistance Sitting-balance support: Bilateral upper extremity supported;Feet supported Sitting balance-Leahy Scale: Poor Sitting balance - Comments: min-max assist for balance for approximately 15 minutes of activity seated at EOB with B UE support Postural control: Posterior lean Standing balance support: Bilateral upper extremity supported;During functional activity Standing balance-Leahy Scale: Poor Standing balance comment: Max assist +2 to maintain standing position with B UE support.                            ADL either performed or assessed with clinical judgement   ADL Overall ADL's : Needs assistance/impaired                 Upper Body Dressing : Maximal assistance;Sitting(+2) Upper Body Dressing Details (indicate cue type and reason): one person assisting for balance and the other to don  brace     Toilet Transfer: Maximal assistance;+2 for safety/equipment;+2 for physical assistance Toilet Transfer Details (indicate cue type and reason): Initially attempted with RW. Pt unable to maintain fully upright position to pivot to chair. On third attempt utilized [place]  with much improvement. However, pt continued to require max assist.            General ADL Comments: Pt reporting dizziness that resolved after sitting at EOB for a few minutes. Very poor seated balance.      Vision       Perception     Praxis      Cognition Arousal/Alertness: Lethargic;Suspect due to medications Behavior During Therapy: Flat affect Overall Cognitive Status: Difficult to assess                                 General Comments: pt did not verbalize answers to questions much; would answer with one word or a nod        Exercises     Shoulder Instructions       General Comments Educated pt and husband concerning benefits of post-acute rehabilitation. Pt's husband present and very vocal during session. Expressing disapproval that therapist did not progress with ambulation despite pt inability to maintain standing balance with max assist +2.     Pertinent Vitals/ Pain       Pain Assessment: 0-10 Pain Score: 8  Pain Location: back  Pain Descriptors / Indicators: Aching;Operative site guarding Pain Intervention(s): Limited activity within patient's tolerance;Monitored during session;Repositioned  Home Living                                          Prior Functioning/Environment              Frequency  Min 2X/week        Progress Toward Goals  OT Goals(current goals can now be found in the care plan section)  Progress towards OT goals: Progressing toward goals  Acute Rehab OT Goals Patient Stated Goal: to go home per husband  OT Goal Formulation: With patient/family Time For Goal Achievement: 08/30/17 Potential to Achieve Goals: Good  Plan Discharge plan remains appropriate    Co-evaluation                 AM-PAC PT "6 Clicks" Daily Activity     Outcome Measure   Help from another person eating meals?: None Help from another person taking care of personal grooming?: A Little Help from another  person toileting, which includes using toliet, bedpan, or urinal?: Total Help from another person bathing (including washing, rinsing, drying)?: Total Help from another person to put on and taking off regular upper body clothing?: A Lot Help from another person to put on and taking off regular lower body clothing?: Total 6 Click Score: 12    End of Session Equipment Utilized During Treatment: Gait belt;Rolling walker;Back brace  OT Visit Diagnosis: Unsteadiness on feet (R26.81);Muscle weakness (generalized) (M62.81);Pain Pain - Right/Left: (back) Pain - part of body: (back)   Activity Tolerance Patient limited by pain   Patient Left in chair;with call bell/phone within reach;with family/visitor present   Nurse Communication Mobility status;Other (comment)(need to use Stedy)        Time: 3557-3220 OT Time Calculation (min): 38 min  Charges: OT General Charges $OT Visit:  1 Visit OT Treatments $Self Care/Home Management : 38-52 mins  Norman Herrlich, MS OTR/L  Pager: [place] [facility] [facility] 08/17/2017, 9:47 AM

## 2017-08-17 NOTE — Clinical Social Work Note (Addendum)
CSW spoke with patient and her husband, and they want to have patient return back home with home health. Per note from care management she is aware of patient's desires.  Weekday CSW to follow up with patient and family if they change their mind.  Per Case management note they are awaiting [facility] program evaluation.  Jones Broom. [place], MSW, [place]  08/17/2017 5:20 PM

## 2017-08-17 NOTE — Progress Notes (Signed)
Physical Therapy Treatment Patient Details Name: [name] MRN: [id number] DOB: 04/24/1951 Today's Date: 08/17/2017    History of Present Illness Pt is a 66 y/o female s/p L3-4 ALIF and L4-5 PLIF. PMH CKD, HTN, CAD, anxiety, DM, back surgery, L TKA, L rotator cuff repair.     PT Comments    Had husband assist patient with PT to simulate home.  Patient was able to stand and ambulate 6' with RW and mod assist.  Husband instructed on proper technique to assist patient. Instructed patient on donning back brace.  Husband states they have a lift chair at home in her bedroom, and now a BSC.  Husband reports "We've been through this before with her other surgery".  Continue to recommend SNF, however husband/pt declining.  Husband can assist patient for short distances.  Patient cannot do stairs - so they will require ambulance transport home.  Would then recommend HHPT and HH Aide at discharge.    Follow Up Recommendations  SNF;Supervision/Assistance - 24 hour(Declining SNF. Needs HHPT and HH Aide)     Equipment Recommendations  Rolling walker with 5" wheels;3in1 (PT)(bariatric)    Recommendations for Other Services       Precautions / Restrictions Precautions Precautions: Back;Fall Precaution Comments: Reviewed with patient, husband, and daughter. Required Braces or Orthoses: Spinal Brace Spinal Brace: Thoracolumbosacral orthotic;Applied in sitting position(Reviewed with pt, husband, dtr on proper donning of brace) Restrictions Weight Bearing Restrictions: No    Mobility  Bed Mobility               General bed mobility comments: Patient in recliner as PT entered room.  Transfers Overall transfer level: Needs assistance Equipment used: Rolling walker (2 wheeled) Transfers: Sit to/from Stand Sit to Stand: Mod assist;Max assist;+2 safety/equipment         General transfer comment: Had husband assist patient during session as if at home.  Verbal cues to have patient  scoot to edge of chair before standing.  Cues for hand placement.  Husband encouraged patient to stand up before reaching for RW.  With increased time, patient able to stand with mod assist to upright position.  Patient and husband performed this x2.  Ambulation/Gait Ambulation/Gait assistance: Mod assist;+2 safety/equipment Ambulation Distance (Feet): 6 Feet(3' forward and 3' backward - x2 with seated rest.) Assistive device: Rolling walker (2 wheeled) Gait Pattern/deviations: Step-through pattern;Decreased step length - right;Decreased step length - left;Shuffle;Trunk flexed Gait velocity: decreased Gait velocity interpretation: Below normal speed for age/gender General Gait Details: Verbal cues to stay close to RW, and stand upright.  Husband providing appropriate cues.  Patient able to ambulate 6' x 2 with seated rest break.   Stairs Stairs: (Patient unable to attempt stairs.  Rec ambulance to home.)          Wheelchair Mobility    Modified Rankin (Stroke Patients Only)       Balance Overall balance assessment: Needs assistance Sitting-balance support: Single extremity supported;Feet supported Sitting balance-Leahy Scale: Poor Sitting balance - Comments: Cues to sit upright and forward to prevent posterior lean.  Patient able to maintain balance with single extremity and min guard assist. Postural control: Posterior lean Standing balance support: Bilateral upper extremity supported;During functional activity Standing balance-Leahy Scale: Poor Standing balance comment: Mod assist for standing balance.                            Cognition Arousal/Alertness: Lethargic Behavior During Therapy: Flat affect Overall  Cognitive Status: Difficult to assess                                 General Comments: Increased time to respond to questions.  Able to follow commands with increased time.  Minimal verbalizations.      Exercises      General  Comments General comments (skin integrity, edema, etc.): Educated patient and husband on donning/doffing brace properly.        Pertinent Vitals/Pain Pain Assessment: 0-10 Pain Score: 8  Pain Location: back  Pain Descriptors / Indicators: Aching;Grimacing Pain Intervention(s): Limited activity within patient's tolerance;Monitored during session;Repositioned;Patient requesting pain meds-RN notified    Home Living                      Prior Function            PT Goals (current goals can now be found in the care plan section) Acute Rehab PT Goals Patient Stated Goal: to go home per husband  Progress towards PT goals: Progressing toward goals(Slow progress)    Frequency    Min 5X/week      PT Plan Current plan remains appropriate    Co-evaluation              AM-PAC PT "6 Clicks" Daily Activity  Outcome Measure  Difficulty turning over in bed (including adjusting bedclothes, sheets and blankets)?: Unable Difficulty moving from lying on back to sitting on the side of the bed? : Unable Difficulty sitting down on and standing up from a chair with arms (e.g., wheelchair, bedside commode, etc,.)?: Unable Help needed moving to and from a bed to chair (including a wheelchair)?: A Lot Help needed walking in hospital room?: A Lot Help needed climbing 3-5 steps with a railing? : Total 6 Click Score: 8    End of Session Equipment Utilized During Treatment: Gait belt;Back brace Activity Tolerance: Patient limited by lethargy;Patient limited by pain Patient left: in chair;with call bell/phone within reach;with family/visitor present Nurse Communication: Mobility status;Patient requests pain meds(Recommend ambulance transport home due to stairs.) PT Visit Diagnosis: Other abnormalities of gait and mobility (R26.89);Unsteadiness on feet (R26.81);Muscle weakness (generalized) (M62.81);Pain Pain - part of body: (back)     Time: 7681-1572 PT Time Calculation (min)  (ACUTE ONLY): 31 min  Charges:  $Therapeutic Activity: 23-37 mins                    G Codes:       Carita Pian. Sanjuana Kava, [facility] Acute Rehab Services Pager [phone]    Despina Pole 08/17/2017, 5:06 PM

## 2017-08-17 NOTE — Progress Notes (Signed)
Pt pain controlled overnight with PRN Oxy IR. Pt unable to ambulate according to PT. Purewick external catheter placed on pt. No BM; stool softener given. Gauze dressing clean, dry, intact with no drainage. Held scheduled catapres @ 2200 d/t low BP. Will continue to monitor.

## 2017-08-18 ENCOUNTER — Telehealth: Payer: Self-pay | Admitting: Endocrinology

## 2017-08-18 ENCOUNTER — Other Ambulatory Visit: Payer: Self-pay | Admitting: Pharmacy Technician

## 2017-08-18 LAB — TYPE AND SCREEN
ABO/RH(D): B NEG
Antibody Screen: NEGATIVE
Unit division: 0
Unit division: 0

## 2017-08-18 LAB — BPAM RBC
Blood Product Expiration Date: 201811232359
Blood Product Expiration Date: 201811262359
Unit Type and Rh: 1700
Unit Type and Rh: 1700

## 2017-08-18 LAB — GLUCOSE, CAPILLARY
Glucose-Capillary: 167 mg/dL — ABNORMAL HIGH (ref 65–99)
Glucose-Capillary: 126 mg/dL — ABNORMAL HIGH (ref 65–99)
Glucose-Capillary: 166 mg/dL — ABNORMAL HIGH (ref 65–99)
Glucose-Capillary: 70 mg/dL (ref 65–99)
Glucose-Capillary: 80 mg/dL (ref 65–99)

## 2017-08-18 NOTE — Progress Notes (Signed)
Physical Therapy Treatment Patient Details Name: [name] MRN: [id number] DOB: [birth year] Today's Date: 08/18/2017    History of Present Illness Pt is a 66 y/o female s/p L3-4 ALIF and L4-5 PLIF. PMH CKD, HTN, CAD, anxiety, DM, back surgery, L TKA, L rotator cuff repair.     PT Comments    Pt is slowly making progress towards her goals, however she would continue to benefit from SNF due to increased level of assist needed for mobility. Pt currently maxAx2 for bed mobility, modAx2 for transfers and modAx2 for ambulation of 20 feet with RW. Pt husband is very supportive and provides encouragement for mobility. Pt and family educated on back precautions and how to provide help.  Pt still was not able to attempt climbing stairs and will need ambulance transfer into home. Pt requires skilled PT in the acute setting to progress mobility and improve strength and endurance to safely navigate their discharge environment.     Follow Up Recommendations  SNF;Supervision/Assistance - 24 hour(Declining SNF. Needs HHPT and HH Aide)     Equipment Recommendations  Rolling walker with 5" wheels;3in1 (PT)(bariatric)    Recommendations for Other Services       Precautions / Restrictions Precautions Precautions: Back;Fall Precaution Booklet Issued: Yes (comment) Precaution Comments: Reviewed with patient, husband, and daughter. Required Braces or Orthoses: Spinal Brace Spinal Brace: Thoracolumbosacral orthotic;Applied in sitting position(resized for better fit, reviewed proper don/doff ) Restrictions Weight Bearing Restrictions: No    Mobility  Bed Mobility Overal bed mobility: Needs Assistance   Rolling: Mod assist;+2 for physical assistance Sidelying to sit: Max assist;+2 for physical assistance       General bed mobility comments: vc for coming all the way into sidelying before pushing up into sitting  Transfers Overall transfer level: Needs assistance Equipment used: Rolling  walker (2 wheeled) Transfers: Sit to/from Stand Sit to Stand: Mod assist;+2 safety/equipment         General transfer comment: modAx2 for power up and steadying, verbal and tactile cues for proper hand placement for powerup, husband encouraging ad providing appropriate cues  Ambulation/Gait Ambulation/Gait assistance: Mod assist;+2 safety/equipment Ambulation Distance (Feet): 20 Feet Assistive device: Rolling walker (2 wheeled) Gait Pattern/deviations: Step-through pattern;Decreased step length - right;Decreased step length - left;Shuffle;Trunk flexed Gait velocity: decreased Gait velocity interpretation: Below normal speed for age/gender General Gait Details: Verbal cues to stay close to RW, and stand upright.  Husband providing appropriate cues.       Balance Overall balance assessment: Needs assistance Sitting-balance support: Single extremity supported;Feet supported Sitting balance-Leahy Scale: Poor Sitting balance - Comments: Cues to sit upright Patient able to maintain balance with single extremity and min guard assist.   Standing balance support: Bilateral upper extremity supported;During functional activity Standing balance-Leahy Scale: Poor Standing balance comment: Mod assist for standing balance.                            Cognition Arousal/Alertness: Lethargic Behavior During Therapy: Flat affect Overall Cognitive Status: Difficult to assess                                 General Comments: Increased time to respond to questions.  Able to follow commands with increased time.  Minimal verbalizations.         General Comments General comments (skin integrity, edema, etc.): Husband and daughter present throughout session  Pertinent Vitals/Pain Pain Assessment: Faces Faces Pain Scale: Hurts little more Pain Location: back  Pain Descriptors / Indicators: Aching;Grimacing Pain Intervention(s): Premedicated before session;Monitored  during session;Limited activity within patient's tolerance;Repositioned           PT Goals (current goals can now be found in the care plan section) Acute Rehab PT Goals Patient Stated Goal: to go home per husband  PT Goal Formulation: With family Time For Goal Achievement: 08/29/17 Potential to Achieve Goals: Fair Progress towards PT goals: Progressing toward goals    Frequency    Min 5X/week      PT Plan Current plan remains appropriate       AM-PAC PT "6 Clicks" Daily Activity  Outcome Measure  Difficulty turning over in bed (including adjusting bedclothes, sheets and blankets)?: Unable Difficulty moving from lying on back to sitting on the side of the bed? : Unable Difficulty sitting down on and standing up from a chair with arms (e.g., wheelchair, bedside commode, etc,.)?: Unable Help needed moving to and from a bed to chair (including a wheelchair)?: A Lot Help needed walking in hospital room?: A Lot Help needed climbing 3-5 steps with a railing? : Total 6 Click Score: 8    End of Session Equipment Utilized During Treatment: Gait belt;Back brace Activity Tolerance: Patient limited by lethargy Patient left: in chair;with call bell/phone within reach;with family/visitor present Nurse Communication: Mobility status(Recommend ambulance transport home due to stairs.) PT Visit Diagnosis: Other abnormalities of gait and mobility (R26.89);Unsteadiness on feet (R26.81);Muscle weakness (generalized) (M62.81);Pain Pain - part of body: (back)     Time: 1120-1200 PT Time Calculation (min) (ACUTE ONLY): 40 min  Charges:  $Gait Training: 8-22 mins $Therapeutic Activity: 8-22 mins                    G Codes:       Venice Marcucci B. Migdalia Dk PT, DPT Acute Rehabilitation  [phone] Pager [phone]     [place] 08/18/2017, 1:14 PM

## 2017-08-18 NOTE — Telephone Encounter (Signed)
[facility] calling for a status update on missing information (sent over 08/14/2017). Please send them the requested information

## 2017-08-18 NOTE — Progress Notes (Signed)
Pt. Sitting up in Chair with brace on.   This RN walked the pt. From her room, to the nurses station and back. Pt used a walker.   Ttolerated well.

## 2017-08-18 NOTE — Patient Outreach (Signed)
[facility]) Care Management  08/18/2017  [name] 25-Apr-1951 [id number]  I haven't been able to follow-up with patient due to current hospital admission. [name] sent a message stating the patient has received the application's that were mailed to her but she has not completed them.  PLAN:  I will attempt to contact the patient next week to check the status of the application's.  Doreene Burke, [place] [phone]

## 2017-08-18 NOTE — Progress Notes (Signed)
    Patient doing well + expected low back discomfort Has been ambulating, but is progressing very slowly   Physical Exam: Vitals:   08/18/17 0500 08/18/17 0958  BP: (!) 107/54 (!) 85/56  Pulse: 95 (!) 107  Resp: 20 20  Temp: 98.5 F (36.9 C) 98.4 F (36.9 C)  SpO2: 96% 99%    Dressing in place NVI  s/p L3/4 A/P fusion, doing well, with slow progress  - up with PT/OT, encourage ambulation. I have spoken with nurse about the importance of mobilization.  - Percocet for pain, Valium for muscle spasms - patient wishes to go home, but PT recommending SNF. Will see how PT goes today, and we will have to make a decision as to whether she goes home vs SNF. Case worker will needs to tentatively discuss options with the patient today in the event she needs an SNF.

## 2017-08-18 NOTE — Progress Notes (Signed)
Pt did not ambulate overnight. Pt moved from chair to bed with 2 assist with a Stedy. Pain controlled with Percocet and Tylenol. Surgical dressing clean, dry, and intact. Pt did not void overnight or have BM. Colace given. Blood glucose better controlled this shift.

## 2017-08-18 NOTE — Progress Notes (Signed)
Occupational Therapy Treatment Patient Details Name: [name] MRN: [id number] DOB: 1951-06-06 Today's Date: 08/18/2017    History of present illness Pt is a 66 y/o female s/p L3-4 ALIF and L4-5 PLIF. PMH CKD, HTN, CAD, anxiety, DM, back surgery, L TKA, L rotator cuff repair.    OT comments  Pt seen with Pt due to pt requiring +2 assist.  Pt moving a bit better today.  Requires mod A for bed mobility and mod A +2 for sit to stand as well as min A +2 for functional mobility.  Total A for LB ADLs.   Pt and spouse are insistent on return home instead of SNF, although SNF is optimal.   Recommend maximal HH services at discharge.   Follow Up Recommendations  SNF  - However, pt refusing, therefore, recommend [facility]OT, [facility], [facility]    Equipment Recommendations  3 in 1 bedside commode    Recommendations for Other Services      Precautions / Restrictions Precautions Precautions: Back;Fall Precaution Booklet Issued: Yes (comment) Precaution Comments: Reviewed with patient, husband, and daughter. Required Braces or Orthoses: Spinal Brace Spinal Brace: Thoracolumbosacral orthotic;Applied in sitting position(resized for better fit, reviewed proper don/doff ) Restrictions Weight Bearing Restrictions: No       Mobility Bed Mobility Overal bed mobility: Needs Assistance   Rolling: Mod assist;+2 for physical assistance Sidelying to sit: Max assist;+2 for physical assistance       General bed mobility comments: vc for coming all the way into sidelying before pushing up into sitting  Transfers Overall transfer level: Needs assistance Equipment used: Rolling walker (2 wheeled) Transfers: Sit to/from Stand Sit to Stand: Mod assist;+2 safety/equipment         General transfer comment: modAx2 for power up and steadying, verbal and tactile cues for proper hand placement for powerup, husband encouraging ad providing appropriate cues    Balance Overall balance assessment: Needs  assistance Sitting-balance support: Single extremity supported;Feet supported Sitting balance-Leahy Scale: Poor Sitting balance - Comments: Cues to sit upright Patient able to maintain balance with single extremity and min guard assist.   Standing balance support: Bilateral upper extremity supported;During functional activity Standing balance-Leahy Scale: Poor Standing balance comment: Mod assist for standing balance.                           ADL either performed or assessed with clinical judgement   ADL Overall ADL's : Needs assistance/impaired                         Toilet Transfer: Moderate assistance;+2 for physical assistance;Ambulation;Comfort height toilet;BSC;Grab bars;RW Armed forces technical officer Details (indicate cue type and reason): mod A +2 to move sit to stand and min A +2 for ambulation          Functional mobility during ADLs: Minimal assistance;Moderate assistance;+2 for physical assistance;Rolling walker General ADL Comments: Spouse able to assist pt with donning brace with min cues.  Discussed pt using BSC initially since she fatigues quickly with activity.  Also discussed her wearing house dresses/gowns instead of pants initially to conserve energy.      Vision       Perception     Praxis      Cognition Arousal/Alertness: Lethargic Behavior During Therapy: Flat affect Overall Cognitive Status: Difficult to assess  General Comments: Increased time to respond to questions.  Able to follow commands with increased time.  Minimal verbalizations.        Exercises     Shoulder Instructions       General Comments Husband and daughter present throughout session    Pertinent Vitals/ Pain       Pain Assessment: Faces Faces Pain Scale: Hurts little more Pain Location: back  Pain Descriptors / Indicators: Aching;Grimacing Pain Intervention(s): Premedicated before session  Home Living                                           Prior Functioning/Environment              Frequency  Min 2X/week        Progress Toward Goals  OT Goals(current goals can now be found in the care plan section)  Progress towards OT goals: Progressing toward goals  Acute Rehab OT Goals Patient Stated Goal: to go home per husband   Plan Discharge plan remains appropriate    Co-evaluation    PT/OT/SLP Co-Evaluation/Treatment: Yes Reason for Co-Treatment: Complexity of the patient's impairments (multi-system involvement)   OT goals addressed during session: ADL's and self-care      AM-PAC PT "6 Clicks" Daily Activity     Outcome Measure   Help from another person eating meals?: None Help from another person taking care of personal grooming?: A Little Help from another person toileting, which includes using toliet, bedpan, or urinal?: Total Help from another person bathing (including washing, rinsing, drying)?: Total Help from another person to put on and taking off regular upper body clothing?: A Lot Help from another person to put on and taking off regular lower body clothing?: Total 6 Click Score: 12    End of Session Equipment Utilized During Treatment: Gait belt;Rolling walker;Back brace  OT Visit Diagnosis: Unsteadiness on feet (R26.81);Pain Pain - part of body: (back )   Activity Tolerance Patient limited by fatigue   Patient Left in chair;with call bell/phone within reach;with family/visitor present   Nurse Communication Mobility status        Time: 1112-1202 OT Time Calculation (min): 50 min  Charges: OT General Charges $OT Visit: 1 Visit OT Treatments $Self Care/Home Management : 8-22 mins  [facility], OTR/L [phone]    Lucille Passy M 08/18/2017, 2:37 PM

## 2017-08-18 NOTE — Progress Notes (Signed)
Upon shift change it was noted that the patient has not voided all day (last 12 hours), bladder scanned patient and she was retaining 235 of urine.  Contacted provider to see if she wants to order fluids, but on call dr. Michela Pitcher no ...she is able to drink and eat..to encourage patient to drink plenty of fluids.  Taking patient two cups of water and will educate patient and family.

## 2017-08-19 LAB — GLUCOSE, CAPILLARY
Glucose-Capillary: 130 mg/dL — ABNORMAL HIGH (ref 65–99)
Glucose-Capillary: 124 mg/dL — ABNORMAL HIGH (ref 65–99)

## 2017-08-19 NOTE — Progress Notes (Signed)
Pt discharging at this time home with family taking all personal belongings. [facility] stretcher transport. Discharge instructions along with prescriptions with verbal understanding. Pt will follow up with MD. No noted distress.

## 2017-08-19 NOTE — Care Management Note (Signed)
Case Management Note  Patient Details  Name: [name] MRN: [id number] Date of Birth: [birth year]  Subjective/Objective:                    Action/Plan: Pt discharging home with [facility] through [place]. Tommi Rumps made aware of d/c. Pt to transport via [facility]. Address verified with husband. [facility] called and transport form on the front of the chart. Bedside RN updated.   Expected Discharge Date:  08/19/17               Expected Discharge Plan:  [place]  In-House Referral:     Discharge planning Services  CM Consult  Post Acute Care Choice:    Choice offered to:     DME Arranged:  3-N-1, Walker rolling DME Agency:  [place]:  PT, OT, Nurse's Aide, Social Work [employer] Agency:  [place]  Status of Service:  Completed, signed off  If discussed at [employer] of Stay Meetings, dates discussed:    Additional Comments:  Pollie Friar, RN 08/19/2017, 2:45 PM

## 2017-08-19 NOTE — Progress Notes (Signed)
Physical Therapy Treatment Patient Details Name: [name] MRN: [id number] DOB: 02/15/1951 Today's Date: 08/19/2017    History of Present Illness Pt is a 66 y/o female s/p L3-4 ALIF and L4-5 PLIF. PMH CKD, HTN, CAD, anxiety, DM, back surgery, L TKA, L rotator cuff repair.     PT Comments    Pt moving a bit better today, however pt still requires modAx2 for sit to stand as well as min A +2 for functional mobility.  Pt able to progress her ambulation distance to 100 feet however requires assist of 2 for steadying. SNF continues to be recommended by PT to maximize pt safety. In absence of SNF maximal [facility] service will be required at discharge.     Follow Up Recommendations  SNF;Supervision/Assistance - 24 hour(Declining SNF. Needs HHPT and HH Aide)     Equipment Recommendations  Rolling walker with 5" wheels;3in1 (PT)(bariatric)    Recommendations for Other Services       Precautions / Restrictions Precautions Precautions: Back;Fall Precaution Booklet Issued: Yes (comment) Precaution Comments: Reviewed with patient, husband, and daughter. Required Braces or Orthoses: Spinal Brace Spinal Brace: Thoracolumbosacral orthotic;Applied in sitting position(resized for better fit, reviewed proper don/doff ) Restrictions Weight Bearing Restrictions: No    Mobility  Bed Mobility               General bed mobility comments: in recliner on entry  Transfers Overall transfer level: Needs assistance Equipment used: Rolling walker (2 wheeled) Transfers: Sit to/from Stand Sit to Stand: Mod assist;+2 safety/equipment         General transfer comment: modAx2 for power up and steadying, verbal and tactile cues for proper hand placement for powerup, husband encouraging ad providing appropriate cues  Ambulation/Gait Ambulation/Gait assistance: Mod assist;+2 safety/equipment Ambulation Distance (Feet): 200 Feet Assistive device: Rolling walker (2 wheeled) Gait Pattern/deviations:  Step-through pattern;Decreased step length - right;Decreased step length - left;Shuffle;Trunk flexed Gait velocity: decreased Gait velocity interpretation: Below normal speed for age/gender General Gait Details: pt requires one seated rest break, Verbal cues to stay close to RW, and stand upright.  Husband providing appropriate cues.     Balance Overall balance assessment: Needs assistance Sitting-balance support: Single extremity supported;Feet supported Sitting balance-Leahy Scale: Poor Sitting balance - Comments: Cues to sit upright Patient able to maintain balance with single extremity and min guard assist.   Standing balance support: During functional activity;Single extremity supported Standing balance-Leahy Scale: Poor Standing balance comment: min assist for static standing                             Cognition Arousal/Alertness: Lethargic Behavior During Therapy: Flat affect Overall Cognitive Status: Within Functional Limits for tasks assessed                                               Pertinent Vitals/Pain Pain Assessment: 0-10 Pain Score: 5  Pain Location: back  Pain Descriptors / Indicators: Aching;Grimacing Pain Intervention(s): Monitored during session;Premedicated before session;Limited activity within patient's tolerance           PT Goals (current goals can now be found in the care plan section) Acute Rehab PT Goals Patient Stated Goal: to go home per husband  PT Goal Formulation: With family Time For Goal Achievement: 08/29/17 Potential to Achieve Goals: Fair Progress towards PT goals:  Progressing toward goals    Frequency    Min 5X/week      PT Plan Current plan remains appropriate       AM-PAC PT "6 Clicks" Daily Activity  Outcome Measure  Difficulty turning over in bed (including adjusting bedclothes, sheets and blankets)?: Unable Difficulty moving from lying on back to sitting on the side of the bed? :  Unable Difficulty sitting down on and standing up from a chair with arms (e.g., wheelchair, bedside commode, etc,.)?: Unable Help needed moving to and from a bed to chair (including a wheelchair)?: A Lot Help needed walking in hospital room?: A Lot Help needed climbing 3-5 steps with a railing? : Total 6 Click Score: 8    End of Session Equipment Utilized During Treatment: Gait belt;Back brace Activity Tolerance: Patient limited by lethargy Patient left: in chair;with call bell/phone within reach;with family/visitor present Nurse Communication: Mobility status(Recommend ambulance transport home due to stairs.) PT Visit Diagnosis: Other abnormalities of gait and mobility (R26.89);Unsteadiness on feet (R26.81);Muscle weakness (generalized) (M62.81);Pain Pain - part of body: (back)     Time: 2025-4270 PT Time Calculation (min) (ACUTE ONLY): 28 min  Charges:  $Gait Training: 23-37 mins                    G Codes:       Lauren Aguayo B. Migdalia Dk PT, DPT Acute Rehabilitation  [phone] Pager [phone]     [place] 08/19/2017, 2:20 PM

## 2017-08-19 NOTE — Progress Notes (Signed)
    Patient with steady progress, evaluated this morning, now 5 days PO. Pt reports px is improving. She continues to not eat or drink much. She continues to prefer to go home and not to SNF if possible.    Physical Exam: BP (!) 107/43 (BP Location: Right Arm)   Pulse 99   Temp 99.2 F (37.3 C) (Oral)   Resp 18   Ht 5\' 6"  (1.676 m)   Wt 124.3 kg (274 lb)   SpO2 100%   BMI 44.22 kg/m   Dressing in place, bulky dressing removed, the majority of the steri strips have already fallen off likely secondary to back moisture/sweat. Incision appears to be healing well. Pt resting comfortably in bed. NVI  POD #5 s/p ANT/POST lumbar fusion   - bulky dressings removed, leave steri strips and can shower normally - up with PT/OT, encourage ambulation  -If clears PT D/C home with [facility]  -If does not clear PT D/C SNF today    -Social work consulted to start placement process in case not cleared  - Percocet for pain, Valium for muscle spasms - d/c today home with [facility] vs SNF

## 2017-08-19 NOTE — Progress Notes (Signed)
Occupational Therapy Treatment Patient Details Name: [name] MRN: [id number] DOB: [birth year] Today's Date: 08/19/2017    History of present illness Pt is a 66 y/o female s/p L3-4 ALIF and L4-5 PLIF. PMH CKD, HTN, CAD, anxiety, DM, back surgery, L TKA, L rotator cuff repair.    OT comments  Pt demonstrates improving activity tolerance.  She refused to work on ADL tasks today - reviewed energy conservation techniques with she and spouse.  She requires min - mod A +2 for functional moblity.    Spouse insists he is able to assist her at discharge, and wish to discharge home with [facility].  Feel however, SNF is best option due to slow progression.   Follow Up Recommendations  SNF;Home health OT    Equipment Recommendations  3 in 1 bedside commode    Recommendations for Other Services      Precautions / Restrictions Precautions Precautions: Back;Fall Precaution Booklet Issued: Yes (comment) Precaution Comments: Reviewed with patient, husband, and daughter. Required Braces or Orthoses: Spinal Brace Spinal Brace: Thoracolumbosacral orthotic;Applied in sitting position Restrictions Weight Bearing Restrictions: No       Mobility Bed Mobility Overal bed mobility: Needs Assistance Bed Mobility: Rolling;Sidelying to Sit Rolling: Mod assist Sidelying to sit: Mod assist       General bed mobility comments: Pt heavily dependent on rails.  Pt requires cues for proper technique.  Assist provided to move LEs off bed and to lift trunk   Transfers Overall transfer level: Needs assistance Equipment used: Rolling walker (2 wheeled) Transfers: Sit to/from [facility] Sit to Stand: Mod assist;+2 safety/equipment Stand pivot transfers: +2 safety/equipment;Min assist       General transfer comment: assist to move into standing and assist to maneuver RW safely     Balance Overall balance assessment: Needs assistance Sitting-balance support: Single extremity supported;Feet  supported Sitting balance-Leahy Scale: Poor Sitting balance - Comments: continues with posterior lean.  Requires min A EOB    Standing balance support: During functional activity;Single extremity supported Standing balance-Leahy Scale: Poor Standing balance comment: bil UE support and min A for static standing                            ADL either performed or assessed with clinical judgement   ADL Overall ADL's : Needs assistance/impaired                         Toilet Transfer: Moderate assistance;+2 for safety/equipment;Ambulation;Comfort height toilet;BSC;RW   Toileting- Clothing Manipulation and Hygiene: Total assistance;Sit to/from stand       Functional mobility during ADLs: Minimal assistance;+2 for physical assistance;+2 for safety/equipment;Rolling walker General ADL Comments: Pt refused getting dressed.  Reinforced energy conservation strategies as she fatigues quickly with activity      Vision       Perception     Praxis      Cognition Arousal/Alertness: Awake/alert Behavior During Therapy: Flat affect Overall Cognitive Status: Within Functional Limits for tasks assessed                                          Exercises     Shoulder Instructions       General Comments husband present and helped during session.  Pt and spouse continue to want discharge home instead of SNF  Pertinent Vitals/ Pain       Pain Assessment: Faces Pain Score: 5  Faces Pain Scale: Hurts a little bit Pain Location: back  Pain Descriptors / Indicators: Aching;Grimacing Pain Intervention(s): Monitored during session;Premedicated before session  Home Living Family/patient expects to be discharged to:: Private residence Living Arrangements: Spouse/significant other Available Help at Discharge: Family;Available 24 hours/day Type of Home: House Home Access: Stairs to enter [employer] of Steps: 3 Entrance Stairs-Rails:  Right Home Layout: One level     Bathroom Shower/Tub: Occupational psychologist: Standard     Home Equipment: Cane - single point          Prior Functioning/Environment              Frequency  Min 2X/week        Progress Toward Goals  OT Goals(current goals can now be found in the care plan section)  Progress towards OT goals: Progressing toward goals  Acute Rehab OT Goals Patient Stated Goal: to go home per husband   Plan Discharge plan remains appropriate    Co-evaluation                 AM-PAC PT "6 Clicks" Daily Activity     Outcome Measure   Help from another person eating meals?: None Help from another person taking care of personal grooming?: A Little Help from another person toileting, which includes using toliet, bedpan, or urinal?: Total Help from another person bathing (including washing, rinsing, drying)?: Total Help from another person to put on and taking off regular upper body clothing?: A Lot Help from another person to put on and taking off regular lower body clothing?: Total 6 Click Score: 12    End of Session Equipment Utilized During Treatment: Gait belt;Back brace;Rolling walker  OT Visit Diagnosis: Unsteadiness on feet (R26.81);Pain   Activity Tolerance Patient limited by fatigue   Patient Left in chair;with call bell/phone within reach;with family/visitor present   Nurse Communication Mobility status        Time: 1110-1144 OT Time Calculation (min): 34 min  Charges: OT General Charges $OT Visit: 1 Visit OT Treatments $Therapeutic Activity: 23-37 mins  [facility], OTR/L [phone]    Lucille Passy M 08/19/2017, 2:38 PM

## 2017-08-19 NOTE — Care Management Important Message (Signed)
Important Message  Patient Details  Name: [name] MRN: [id number] Date of Birth: [birth year]   Medicare Important Message Given:  Yes    Nathen May 08/19/2017, 11:17 AM

## 2017-08-19 NOTE — Progress Notes (Signed)
CSW notified by [facility] that patient will return home with home health; not interested in SNF.  CSW signing off.  Laveda Abbe, [place] Clinical Social Worker [phone]

## 2017-08-20 ENCOUNTER — Ambulatory Visit: Payer: Medicare Other

## 2017-08-20 ENCOUNTER — Encounter (HOSPITAL_COMMUNITY): Payer: Self-pay | Admitting: Orthopedic Surgery

## 2017-08-20 ENCOUNTER — Telehealth: Payer: Self-pay | Admitting: *Deleted

## 2017-08-20 DIAGNOSIS — Z4789 Encounter for other orthopedic aftercare: Secondary | ICD-10-CM | POA: Diagnosis not present

## 2017-08-20 DIAGNOSIS — M48061 Spinal stenosis, lumbar region without neurogenic claudication: Secondary | ICD-10-CM | POA: Diagnosis not present

## 2017-08-20 NOTE — Discharge Summary (Signed)
Patient ID: [name] MRN: [id number] DOB/AGE: June 10, 1951 66 y.o.  Admit date: 08/13/2017 Discharge date: 08/19/2017  Admission Diagnoses:  Active Problems:   Radiculopathy   Discharge Diagnoses:  Same  Past Medical History:  Diagnosis Date  . Anxiety   . Arthritis   . Blood dyscrasia    "FREE BLEEDER"  . CERVICAL RADICULOPATHY, LEFT   . Chronic back pain   . CKD (chronic kidney disease)    DR. SANFORD  [facility]  . COMMON MIGRAINE   . CORONARY ARTERY DISEASE   . Cough    CURRENT COLD  . Cystitis   . DEPRESSION   . Diabetes mellitus, type II ([place])   . DIVERTICULOSIS-COLON   . Gastric ulcer 04/2008  . Gastroparesis   . GERD (gastroesophageal reflux disease)   . Hiatal hernia   . Hyperlipidemia   . Hypertension   . Hypothyroidism   . INSOMNIA-SLEEP DISORDER-UNSPEC   . Iron deficiency anemia   . Wears glasses     Surgeries: Procedure(s): LUMBAR 3-4, LUMBAR 4-5 POSTERIOR SPINAL FUSION WITH INSTRUMENTATION AND ALLOGRAFT; REQUEST 3.5 HOURS on 08/14/2017   Consultants: None  Discharged Condition: Improved  Hospital Course: [name] is an 66 y.o. female who was admitted 08/13/2017 for operative treatment of radiculopathy. Patient has severe unremitting pain that affects sleep, daily activities, and work/hobbies. After pre-op clearance the patient was taken to the operating room on 08/14/2017 and underwent  Procedure(s): LUMBAR 3-4, LUMBAR 4-5 POSTERIOR SPINAL FUSION WITH INSTRUMENTATION AND ALLOGRAFT; REQUEST 3.5 HOURS.    Patient was given perioperative antibiotics:  Anti-infectives (From admission, onward)   Start     Dose/Rate Route Frequency Ordered Stop   08/14/17 0830  vancomycin (VANCOCIN) 1,250 mg in sodium chloride 0.9 % 250 mL IVPB     1,250 mg 166.7 mL/hr over 90 Minutes Intravenous To Surgery 08/14/17 0819 08/14/17 0820   08/13/17 2000  vancomycin (VANCOCIN) 1,250 mg in sodium chloride 0.9 % 250 mL IVPB     1,250 mg 166.7 mL/hr  over 90 Minutes Intravenous  Once 08/13/17 1614 08/13/17 2141   08/13/17 1800  trimethoprim (TRIMPEX) tablet 100 mg  Status:  Discontinued     100 mg Oral Every evening 08/13/17 1611 08/19/17 1848   08/13/17 0717  vancomycin (VANCOCIN) 1-5 GM/200ML-% IVPB    Comments:  Nyoka Cowden   : cabinet override      08/13/17 0717 08/13/17 1929   08/12/17 0745  vancomycin (VANCOCIN) 1,500 mg in sodium chloride 0.9 % 500 mL IVPB     1,500 mg 250 mL/hr over 120 Minutes Intravenous On call to O.R. 08/12/17 0739 08/13/17 0559       Patient was given sequential compression devices, early ambulation to prevent DVT.  Patient benefited maximally from hospital stay and there were no complications.    Recent vital signs:  Patient Vitals for the past 24 hrs:  BP Temp Temp src Pulse Resp SpO2  08/19/17 1512 (!) 98/55 98.2 F (36.8 C) Oral (!) 101 18 100 %     Discharge Medications:   Allergies as of 08/19/2017      Reactions   Hydrocodone Other (See Comments)   Tachycardia   Ciprofloxacin Other (See Comments)   Dizziness   Penicillins Hives   Has patient had a PCN reaction causing immediate rash, facial/tongue/throat swelling, SOB or lightheadedness with hypotension: No Has patient had a PCN reaction causing severe rash involving mucus membranes or skin necrosis: No Has patient had  a PCN reaction that required hospitalization No Has patient had a PCN reaction occurring within the last 10 years: No If all of the above answers are "NO", then may proceed with Cephalosporin use.      Medication List    STOP taking these medications   HYDROcodone-acetaminophen 10-325 MG tablet Commonly known as:  NORCO     TAKE these medications   amitriptyline 10 MG tablet Commonly known as:  ELAVIL Take 10 mg by mouth at bedtime.   amLODipine-valsartan 10-320 MG tablet Commonly known as:  EXFORGE Take 1 tablet by mouth daily. Annual appt due in Sept must see provider for future refills   aspirin 81  MG EC tablet Take 81 mg by mouth every evening.   atorvastatin 20 MG tablet Commonly known as:  LIPITOR Take 1 tablet (20 mg total) by mouth daily.   cloNIDine 0.1 MG tablet Commonly known as:  CATAPRES TAKE ONE TABLET BY MOUTH TWICE A DAY   ELMIRON 100 MG capsule Generic drug:  pentosan polysulfate Take 100 mg by mouth 2 (two) times daily.   estradiol 0.1 mg/24hr patch Commonly known as:  CLIMARA - Dosed in mg/24 hr Place 1 patch (0.1 mg total) onto the skin once a week.   ferrous sulfate 325 (65 FE) MG tablet Take 325 mg by mouth daily with breakfast.   FLUoxetine 20 MG capsule Commonly known as:  PROZAC TAKE ONE CAPSULE BY MOUTH DAILY What changed:    how much to take  how to take this  when to take this   gabapentin 600 MG tablet Commonly known as:  NEURONTIN Take 600 mg by mouth 2 (two) times daily.   glucose blood test strip Commonly known as:  ACCU-CHEK AVIVA PLUS Use to test blood sugar 3 times daily- Dx code E11.9   insulin glargine 100 UNIT/ML injection Commonly known as:  LANTUS Inject 30-35 Units into the skin at bedtime. Takes 30 units if glucose is <300 and 35 units if glucose is >300.   Insulin Glargine 300 UNIT/ML Sopn Commonly known as:  TOUJEO SOLOSTAR Inject 30 Units into the skin daily.   lansoprazole 15 MG capsule Commonly known as:  PREVACID Take 15 mg by mouth every evening.   levETIRAcetam 250 MG tablet Commonly known as:  KEPPRA Take 250-500 mg by mouth 2 (two) times daily. Dose depends on level of pain.   levothyroxine 100 MCG tablet Commonly known as:  SYNTHROID, LEVOTHROID TAKE 1 TABLET(100 MCG) BY MOUTH DAILY   NOVOLOG FLEXPEN 100 UNIT/ML FlexPen Generic drug:  insulin aspart Inject 10 Units into the skin 3 (three) times daily with meals.   ondansetron 4 MG tablet Commonly known as:  ZOFRAN TAKE 1 TABLET BY MOUTH EVERY 6 HOURS AS NEEDED FOR NAUSEA What changed:    how much to take  how to take this  when to take  this  additional instructions   progesterone 100 MG capsule Commonly known as:  PROMETRIUM Take 1 capsule (100 mg total) by mouth at bedtime.   torsemide 20 MG tablet Commonly known as:  DEMADEX Take 20 mg by mouth daily.   trimethoprim 100 MG tablet Commonly known as:  TRIMPEX Take 100 mg by mouth every evening.   VICTOZA 18 MG/3ML Sopn Generic drug:  liraglutide Inject 0.2 mLs (1.2 mg total) into the skin daily. Inject once daily at the same time       Diagnostic Studies: Dg Chest 2 View  Result Date: 08/07/2017 CLINICAL DATA:  Preop for lumbar surgery. EXAM: CHEST  2 VIEW COMPARISON:  05/01/2017 FINDINGS: Normal heart size and mediastinal contours. There is no edema, consolidation, effusion, or pneumothorax. Cholecystectomy clips. IMPRESSION: No evidence of active disease. Electronically Signed   By: Monte Fantasia M.D.   On: 08/07/2017 15:30   Dg Lumbar Spine 2-3 Views  Result Date: 08/14/2017 CLINICAL DATA:  Posterior/lateral fusion L3-L5. EXAM: DG C-ARM GT 120 MIN; LUMBAR SPINE - 2-3 VIEW COMPARISON:  None. FINDINGS: Posterior rod and bipedicular screw fixation identified at L3-L4-L5. Left lateral plate and screw fixation at L3-4 noted. No definite complicating features are identified. IMPRESSION: Fusion changes from L3-L5 as described. No definite complicating features. Electronically Signed   By: Margarette Canada M.D.   On: 08/14/2017 12:15   Dg Lumbar Spine 2-3 Views  Result Date: 08/14/2017 CLINICAL DATA:  Fusion EXAM: LUMBAR SPINE - 1 VIEW COMPARISON:  Intraoperative images August 13, 2017 FINDINGS: Cross-table lateral lumbar image obtained. A portion of the anterior aspects of the lower lumbar vertebral bodies not visualized. There is posterior screw and plate fixation at L4 and L5 with screw tips in the respective vertebral body superiorly. There is lateral screw and plate fixation at L3 and L4 with screws transversing respective vertebral bodies. There is no appreciable  fracture or spondylolisthesis. There remains disc space narrowing at L4-5. A disc spacer is present at this level. IMPRESSION: Postoperative changes with lateral screw and plate fixation at L3 and L4 and posterior screw and plate fixation at L4 and L5 with support hardware intact. Disc spacer at L4-5 with moderate narrowing at this level noted. No fracture or spondylolisthesis evident on single lateral view. Electronically Signed   By: Lowella Grip III M.D.   On: 08/14/2017 08:57   Dg Lumbar Spine 2-3 Views  Result Date: 08/13/2017 CLINICAL DATA:  Status post lumbar procedure at L3-4. Reported fluoro time is 1 minutes, 51 seconds EXAM: DG C-ARM 61-120 MIN; LUMBAR SPINE - 2-3 VIEW COMPARISON:  AP view of the abdomen of Feb 06, 2017 FINDINGS: The patient had previously undergone PLIF at L4-5. The patient has undergone L3-4 left lateral fusion with inter discal device placement. The vertebral bodies are preserved in height where visualized. IMPRESSION: Status post lateral fusion and interbody fusion at L3-4 without immediate postprocedure complication. Previous PLIF at L4-5. Electronically Signed   By: David  Martinique M.D.   On: 08/13/2017 12:43   Nm Myocar Multi W/spect W/wall Motion / Ef  Result Date: 07/23/2017 CLINICAL DATA:  Chest pain EXAM: MYOCARDIAL IMAGING WITH SPECT (REST AND PHARMACOLOGIC-STRESS) GATED LEFT VENTRICULAR WALL MOTION STUDY LEFT VENTRICULAR EJECTION FRACTION TECHNIQUE: Standard myocardial SPECT imaging was performed after resting intravenous injection of 10 mCi Tc-50m tetrofosmin. Subsequently, intravenous infusion of Lexiscan was performed under the supervision of the Cardiology staff. At peak effect of the drug, 30 mCi Tc-23m tetrofosmin was injected intravenously and standard myocardial SPECT imaging was performed. Quantitative gated imaging was also performed to evaluate left ventricular wall motion, and estimate left ventricular ejection fraction. COMPARISON:  None. FINDINGS:  Perfusion: No decreased activity in the left ventricle on stress imaging to suggest reversible ischemia or infarction. Apparent fixed defect along the anterior apex is more conspicuous on rest imaging and is likely related to breast attenuation. Wall Motion: Normal left ventricular wall motion. No left ventricular dilation. Left Ventricular Ejection Fraction: 69 % End diastolic volume 82 ml End systolic volume 26 ml IMPRESSION: 1. No reversible ischemia or infarction. 2. Normal left ventricular wall motion. 3.  Left ventricular ejection fraction 69% 4. Non invasive risk stratification*: Low *2012 Appropriate Use Criteria for Coronary Revascularization Focused Update: J Am Coll Cardiol. 8938;10(1):751-025. http://content.airportbarriers.com.aspx?articleid=[email or web address] Electronically Signed   By: Julian Hy M.D.   On: 07/23/2017 15:12   Dg C-arm 61-120 Min  Result Date: 08/13/2017 CLINICAL DATA:  Status post lumbar procedure at L3-4. Reported fluoro time is 1 minutes, 51 seconds EXAM: DG C-ARM 61-120 MIN; LUMBAR SPINE - 2-3 VIEW COMPARISON:  AP view of the abdomen of Feb 06, 2017 FINDINGS: The patient had previously undergone PLIF at L4-5. The patient has undergone L3-4 left lateral fusion with inter discal device placement. The vertebral bodies are preserved in height where visualized. IMPRESSION: Status post lateral fusion and interbody fusion at L3-4 without immediate postprocedure complication. Previous PLIF at L4-5. Electronically Signed   By: David  Martinique M.D.   On: 08/13/2017 12:43   Dg C-arm Gt 120 Min  Result Date: 08/14/2017 CLINICAL DATA:  Posterior/lateral fusion L3-L5. EXAM: DG C-ARM GT 120 MIN; LUMBAR SPINE - 2-3 VIEW COMPARISON:  None. FINDINGS: Posterior rod and bipedicular screw fixation identified at L3-L4-L5. Left lateral plate and screw fixation at L3-4 noted. No definite complicating features are identified. IMPRESSION: Fusion changes from L3-L5 as described. No definite complicating  features. Electronically Signed   By: Margarette Canada M.D.   On: 08/14/2017 12:15    Disposition: 01-Home or Self Care  Discharge Instructions    Ambulatory referral to Home Health   Complete by:  As directed    Please evaluate [name] for admission to [facility].  Disciplines requested: PT/OT  Services to provide: PT/OT gait and ADL's  Physician to follow patient's care (the person listed here will be responsible for signing ongoing orders): Dr Lynann Bologna  Requested Start of Care Date: 85/27/7824 I certify that this patient is under my care and that I, or a Nurse Practitioner or Physician's Assistant working with me, had a face-to-face encounter that meets the physician face-to-face requirements with patient on 09/18/2017. The encounter with the patient was in whole, or in part for the following medical condition(s) which is the primary reason for home health care (List medical condition). S/P Lumbar Fusion   Special Instructions: TLSO brace at all times when OOB No bend/twist/stoop/lift >10lbs   Does the patient have Medicare or Medicaid?:  Yes   The encounter with the patient was in whole, or in part, for the following medical condition, which is the primary reason for home health care:  Lumbar Fusion   Reason for Medically Necessary Home Health Services:  Therapy- Personnel officer, Public librarian   My clinical findings support the need for the above services:  Unable to leave home safely without assistance and/or assistive device   I certify that, based on my findings, the following services are medically necessary home health services:  Physical therapy   Further, I certify that my clinical findings support that this patient is homebound due to:  Unable to leave home safely without assistance   Discharge patient   Complete by:  As directed    Discharge disposition:  01-Home or Self Care   Discharge patient date:  08/15/2017   Discharge patient   Complete by:   As directed    Home with [facility] if clears PT, if not then D/C to SNF   Discharge disposition:  01-Home or Self Care   Discharge patient date:  08/19/2017     POD #5 s/p ANT/POST lumbar fusion   -  bulky dressings removed, leave steri strips and can shower normally - up with PT/OT, encourage ambulation             -If clears PT D/C home with [facility] - Percocet for pain, Valium for muscle spasms - d/c today home with [facility]  -F/U in office 2 weeks   Signed: Justice Britain 08/20/2017, 12:54 PM

## 2017-08-20 NOTE — Telephone Encounter (Signed)
Pt was on TCM list admitted 08/13/17 for for operative treatment of radiculopathy. Pt had procedure done on 08/14/17  LUMBAR 3-4, LUMBAR 4-5 POSTERIOR SPINAL FUSION WITH INSTRUMENTATION AND ALLOGRAFT. Pt D/C 08/19/17 and will f/u with [place] in 2 weeks...Johny Chess

## 2017-08-20 NOTE — Telephone Encounter (Signed)
Have you seen this form for the requested information?

## 2017-08-22 ENCOUNTER — Other Ambulatory Visit: Payer: Self-pay | Admitting: Endocrinology

## 2017-08-22 DIAGNOSIS — Z4789 Encounter for other orthopedic aftercare: Secondary | ICD-10-CM | POA: Diagnosis not present

## 2017-08-22 DIAGNOSIS — M48061 Spinal stenosis, lumbar region without neurogenic claudication: Secondary | ICD-10-CM | POA: Diagnosis not present

## 2017-08-24 DIAGNOSIS — Z4789 Encounter for other orthopedic aftercare: Secondary | ICD-10-CM | POA: Diagnosis not present

## 2017-08-24 DIAGNOSIS — M48061 Spinal stenosis, lumbar region without neurogenic claudication: Secondary | ICD-10-CM | POA: Diagnosis not present

## 2017-08-25 ENCOUNTER — Other Ambulatory Visit: Payer: Self-pay | Admitting: Pharmacy Technician

## 2017-08-25 NOTE — Patient Outreach (Signed)
[place] [facility]) Care Management  08/25/2017  ALLEA KASSNER 1951-07-05 [id number]  Unsuccessful call to Ms. Crumm in reference to patient assistance application's that were mailed 07/29/17. I left a HIPAA compliant message with her spouse. This is the second time that I have tried to reach the patient to follow-up on application's.  PLAN:  1. I will give the patient 2 days to return my call. 2. If she hasn't called me I will attempt to contact her again on Monday when I come back to the office. 3. If I can't reach the patient after the third attempt I will send a message to [place], Rph to close patient's case.  Doreene Burke, [place] [phone]

## 2017-08-25 NOTE — Telephone Encounter (Signed)
I have not worked on this

## 2017-08-26 DIAGNOSIS — Z4789 Encounter for other orthopedic aftercare: Secondary | ICD-10-CM | POA: Diagnosis not present

## 2017-08-26 DIAGNOSIS — M48061 Spinal stenosis, lumbar region without neurogenic claudication: Secondary | ICD-10-CM | POA: Diagnosis not present

## 2017-09-01 ENCOUNTER — Other Ambulatory Visit: Payer: Self-pay | Admitting: Pharmacy Technician

## 2017-09-01 ENCOUNTER — Encounter: Payer: Self-pay | Admitting: Internal Medicine

## 2017-09-01 DIAGNOSIS — K259 Gastric ulcer, unspecified as acute or chronic, without hemorrhage or perforation: Secondary | ICD-10-CM

## 2017-09-01 DIAGNOSIS — Z87891 Personal history of nicotine dependence: Secondary | ICD-10-CM

## 2017-09-01 DIAGNOSIS — K3184 Gastroparesis: Secondary | ICD-10-CM

## 2017-09-01 DIAGNOSIS — K219 Gastro-esophageal reflux disease without esophagitis: Secondary | ICD-10-CM

## 2017-09-01 DIAGNOSIS — Z9889 Other specified postprocedural states: Secondary | ICD-10-CM | POA: Diagnosis not present

## 2017-09-01 DIAGNOSIS — G43009 Migraine without aura, not intractable, without status migrainosus: Secondary | ICD-10-CM | POA: Diagnosis not present

## 2017-09-01 DIAGNOSIS — N189 Chronic kidney disease, unspecified: Secondary | ICD-10-CM | POA: Diagnosis not present

## 2017-09-01 DIAGNOSIS — E1122 Type 2 diabetes mellitus with diabetic chronic kidney disease: Secondary | ICD-10-CM | POA: Diagnosis not present

## 2017-09-01 DIAGNOSIS — M1991 Primary osteoarthritis, unspecified site: Secondary | ICD-10-CM | POA: Diagnosis not present

## 2017-09-01 DIAGNOSIS — I2510 Atherosclerotic heart disease of native coronary artery without angina pectoris: Secondary | ICD-10-CM | POA: Diagnosis not present

## 2017-09-01 DIAGNOSIS — E785 Hyperlipidemia, unspecified: Secondary | ICD-10-CM

## 2017-09-01 DIAGNOSIS — E669 Obesity, unspecified: Secondary | ICD-10-CM

## 2017-09-01 DIAGNOSIS — M48061 Spinal stenosis, lumbar region without neurogenic claudication: Secondary | ICD-10-CM | POA: Diagnosis not present

## 2017-09-01 DIAGNOSIS — Z7982 Long term (current) use of aspirin: Secondary | ICD-10-CM

## 2017-09-01 DIAGNOSIS — Z4789 Encounter for other orthopedic aftercare: Secondary | ICD-10-CM | POA: Diagnosis not present

## 2017-09-01 DIAGNOSIS — F419 Anxiety disorder, unspecified: Secondary | ICD-10-CM | POA: Diagnosis not present

## 2017-09-01 DIAGNOSIS — K5732 Diverticulitis of large intestine without perforation or abscess without bleeding: Secondary | ICD-10-CM

## 2017-09-01 DIAGNOSIS — D759 Disease of blood and blood-forming organs, unspecified: Secondary | ICD-10-CM | POA: Diagnosis not present

## 2017-09-01 DIAGNOSIS — E039 Hypothyroidism, unspecified: Secondary | ICD-10-CM

## 2017-09-01 DIAGNOSIS — I129 Hypertensive chronic kidney disease with stage 1 through stage 4 chronic kidney disease, or unspecified chronic kidney disease: Secondary | ICD-10-CM | POA: Diagnosis not present

## 2017-09-01 DIAGNOSIS — G4700 Insomnia, unspecified: Secondary | ICD-10-CM

## 2017-09-01 DIAGNOSIS — F329 Major depressive disorder, single episode, unspecified: Secondary | ICD-10-CM | POA: Diagnosis not present

## 2017-09-01 DIAGNOSIS — Z794 Long term (current) use of insulin: Secondary | ICD-10-CM

## 2017-09-01 DIAGNOSIS — E1143 Type 2 diabetes mellitus with diabetic autonomic (poly)neuropathy: Secondary | ICD-10-CM

## 2017-09-01 DIAGNOSIS — K449 Diaphragmatic hernia without obstruction or gangrene: Secondary | ICD-10-CM

## 2017-09-01 DIAGNOSIS — M5412 Radiculopathy, cervical region: Secondary | ICD-10-CM | POA: Diagnosis not present

## 2017-09-01 DIAGNOSIS — D509 Iron deficiency anemia, unspecified: Secondary | ICD-10-CM

## 2017-09-01 DIAGNOSIS — Z6841 Body Mass Index (BMI) 40.0 and over, adult: Secondary | ICD-10-CM

## 2017-09-01 NOTE — Patient Outreach (Signed)
[place] [facility]) Care Management  09/01/2017  DAENA ALPER 1951-08-06 [id number]   Unsuccessful patient outreach call to Ms. Sabra Heck in reference to patient assistance application's that were mailed in October.  This call was my third attempt and nowI will send a message to [place], Rph to case closure.  Doreene Burke, [place] [phone]

## 2017-09-02 ENCOUNTER — Other Ambulatory Visit: Payer: Self-pay | Admitting: Endocrinology

## 2017-09-03 ENCOUNTER — Other Ambulatory Visit: Payer: Self-pay | Admitting: Pharmacist

## 2017-09-03 NOTE — Patient Outreach (Signed)
[place] [facility]) Care Management  09/03/2017  [name] 08/22/1951 [id number]  [place] had made 3 unsuccessful outreach attempt to patient with no return call.    Patient was active with [employer] to attempt to gain manufacturer patient assistance.   Of note, as the end of the year approaches, it will become harder to attempt to get patient assistance for remainder of 2018.   Plan:  Outreach letter sent to patient.  If no reply in 10 business days, case will be closed.   Karrie Meres, PharmD, [place] [phone]

## 2017-09-04 ENCOUNTER — Telehealth: Payer: Self-pay | Admitting: Endocrinology

## 2017-09-04 DIAGNOSIS — M48061 Spinal stenosis, lumbar region without neurogenic claudication: Secondary | ICD-10-CM | POA: Diagnosis not present

## 2017-09-04 DIAGNOSIS — Z4789 Encounter for other orthopedic aftercare: Secondary | ICD-10-CM | POA: Diagnosis not present

## 2017-09-04 NOTE — Telephone Encounter (Signed)
Please call patient's husband. He is very much  concerned ph# [phone]

## 2017-09-05 ENCOUNTER — Other Ambulatory Visit: Payer: Self-pay

## 2017-09-05 MED ORDER — CLONIDINE HCL 0.1 MG PO TABS: 0.1000 mg | ORAL_TABLET | Freq: Two times a day (BID) | ORAL | 3 refills | Status: DC

## 2017-09-05 NOTE — Telephone Encounter (Signed)
I called the patients husband and he stated that he is very upset with our office but he did not go into detail. He wants to speak to someone who is not in our office but is over our office. I gave him the name of Lillia Mountain and I did not have a general number to give him but he asked that I send an email to Aaron Edelman to ask if he could contact Mr. Tugman so that he could address his concerns with him. Mr. Barrette number is [phone].

## 2017-09-05 NOTE — Telephone Encounter (Signed)
email has been sent to [name] and I will follow up according to his instruction

## 2017-09-07 ENCOUNTER — Other Ambulatory Visit: Payer: Self-pay | Admitting: Endocrinology

## 2017-09-09 NOTE — Telephone Encounter (Signed)
I spoke to [facility] yesterday and she has completed the paperwork and already faxed to appropriate destination.

## 2017-09-10 DIAGNOSIS — Z4789 Encounter for other orthopedic aftercare: Secondary | ICD-10-CM | POA: Diagnosis not present

## 2017-09-10 DIAGNOSIS — M48061 Spinal stenosis, lumbar region without neurogenic claudication: Secondary | ICD-10-CM | POA: Diagnosis not present

## 2017-09-15 ENCOUNTER — Encounter: Payer: Self-pay | Admitting: Pharmacist

## 2017-09-17 ENCOUNTER — Other Ambulatory Visit: Payer: Self-pay | Admitting: Pharmacist

## 2017-09-17 NOTE — Patient Outreach (Signed)
[place] [facility]) Care Management  09/17/2017  [name] 04/29/1951 [id number]  Patient was initially referred to [place] Pharmacist for medication patient assistance.   [place] Technician made three outreach attempts with no return calls.  Outreach letter was mailed to patient with no response.   Plan:  Case will be closed due to inability to maintain contact with patient.   [facility] Care management assistant will be updated.   PCP closure letter sent to PCP.   Karrie Meres, PharmD, [place] [phone]

## 2017-09-19 ENCOUNTER — Telehealth: Payer: Self-pay | Admitting: Internal Medicine

## 2017-09-19 DIAGNOSIS — M48061 Spinal stenosis, lumbar region without neurogenic claudication: Secondary | ICD-10-CM | POA: Diagnosis not present

## 2017-09-19 DIAGNOSIS — Z4789 Encounter for other orthopedic aftercare: Secondary | ICD-10-CM | POA: Diagnosis not present

## 2017-09-19 NOTE — Telephone Encounter (Signed)
Copied from [place]. Topic: General - Other >> Sep 19, 2017  3:13 PM Aurelio Brash B wrote: Reason for CRM: Timmothy Sours an OT from [facility] called and wanted to let Dr Jenny Reichmann know the pt has been having pain in left shoulder since her surgery.   Pt was also going to let Dr Lynann Bologna know this as well

## 2017-09-22 ENCOUNTER — Telehealth: Payer: Self-pay | Admitting: Internal Medicine

## 2017-09-22 NOTE — Telephone Encounter (Signed)
Ok for verbals 

## 2017-09-22 NOTE — Telephone Encounter (Unsigned)
Copied from [place] [id number]. Topic: General - Other >> Sep 22, 2017 12:08 PM Neva Seat wrote: Mel from [place] Needs verbal orders for pt to receive PT for 1 time a week for 3 weeks.

## 2017-09-23 DIAGNOSIS — Z4789 Encounter for other orthopedic aftercare: Secondary | ICD-10-CM | POA: Diagnosis not present

## 2017-09-23 DIAGNOSIS — M48061 Spinal stenosis, lumbar region without neurogenic claudication: Secondary | ICD-10-CM | POA: Diagnosis not present

## 2017-09-24 ENCOUNTER — Ambulatory Visit (INDEPENDENT_AMBULATORY_CARE_PROVIDER_SITE_OTHER): Payer: Medicare Other | Admitting: Family

## 2017-09-24 ENCOUNTER — Other Ambulatory Visit: Payer: Self-pay | Admitting: Family

## 2017-09-24 ENCOUNTER — Encounter: Payer: Self-pay | Admitting: Family

## 2017-09-24 VITALS — BP 132/78 | HR 73 | Temp 97.9°F | Ht 66.0 in | Wt 287.0 lb

## 2017-09-24 DIAGNOSIS — I10 Essential (primary) hypertension: Secondary | ICD-10-CM | POA: Diagnosis not present

## 2017-09-24 MED ORDER — AMLODIPINE BESYLATE 5 MG PO TABS: 5.0000 mg | ORAL_TABLET | Freq: Every day | ORAL | 1 refills | Status: DC

## 2017-09-24 NOTE — Progress Notes (Signed)
[name] is a 66 y.o. female with the following history as recorded in Epic[employer]:  Patient Active Problem List   Diagnosis Date Noted  . Weakness 05/01/2017  . Hypothyroidism 05/01/2017  . Hyperkalemia 05/01/2017  . Acute renal failure superimposed on stage 4 chronic kidney disease ([place]) 05/01/2017  . Hypotension 05/01/2017  . Sepsis ([place]) 05/01/2017  . Dysuria 04/23/2017  . Right leg swelling 12/27/2016  . Leg pain, lateral, right 11/05/2016  . Radiculopathy 04/03/2016  . Hypersomnolence 11/23/2015  . Right sided abdominal pain 07/25/2015  . Malaise and fatigue 11/05/2014  . Fracture of fifth metacarpal bone of right hand 07/08/2014  . Right cervical radiculopathy 06/27/2014  . Chronic interstitial cystitis 05/23/2014  . Osteoarthritis of [place] joint of thumb 04/11/2014  . De Quervain's tenosynovitis, left 03/14/2014  . CKD (chronic kidney disease) stage 4, GFR 15-29 ml/min (HCC) 03/03/2014  . Lower back pain 03/03/2014  . Ingrown nail 03/03/2014  . Peripheral edema 02/23/2014  . Lateral epicondylitis of right elbow 01/31/2014  . Neck pain on left side 11/19/2013  . Abdominal discomfort 10/12/2012  . Right lumbar radiculopathy 05/11/2012  . Peripheral neuropathy ([place]) 05/11/2012  . Vertigo 03/29/2012  . Headache(784.0) 03/10/2012  . Lumbar radiculopathy 10/24/2011  . 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 Right leg weakness 04/04/2011  . Hematochezia 01/07/2011  . Preventative health care 01/07/2011  . CHOLELITHIASIS 05/04/2009  . GERD 04/26/2009  . Gastroparesis 04/26/2009  . ULCER-GASTRIC 03/15/2009  . DIVERTICULOSIS-COLON 03/15/2009  . Cervical radiculopathy 10/28/2008  . MENOPAUSAL DISORDER 06/29/2008  . Pain in joint, shoulder region 10/12/2007  . INSOMNIA-SLEEP DISORDER-UNSPEC 09/01/2007  . Depression 09/01/2007  . ANEMIA-NOS 08/13/2007  . COMMON MIGRAINE 08/13/2007  . 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 Diabetes ([place]) 05/06/2007  . HLD  (hyperlipidemia) 05/06/2007  . ANXIETY 05/06/2007  . Essential hypertension 05/06/2007  . Coronary atherosclerosis 05/06/2007    Current Outpatient Medications  Medication Sig Dispense Refill  . amitriptyline (ELAVIL) 10 MG tablet Take 10 mg by mouth at bedtime.     Marland Kitchen aspirin 81 MG EC tablet Take 81 mg by mouth every evening.     Marland Kitchen atorvastatin (LIPITOR) 20 MG tablet Take 1 tablet (20 mg total) by mouth daily. 90 tablet 3  . cloNIDine (CATAPRES) 0.1 MG tablet TAKE ONE TABLET BY MOUTH TWICE A DAY 60 tablet 3  . ELMIRON 100 MG capsule Take 100 mg by mouth 2 (two) times daily.     Marland Kitchen estradiol (CLIMARA - DOSED IN MG/24 HR) 0.1 mg/24hr patch Place 1 patch (0.1 mg total) onto the skin once a week. 12 patch 4  . ferrous sulfate 325 (65 FE) MG tablet Take 325 mg by mouth daily with breakfast.     . FLUoxetine (PROZAC) 20 MG capsule TAKE ONE CAPSULE BY MOUTH DAILY (Patient taking differently: TAKE 20mg  BY MOUTH DAILY) 90 capsule 3  . gabapentin (NEURONTIN) 600 MG tablet Take 600 mg by mouth 2 (two) times daily.    Marland Kitchen glucose blood (ACCU-CHEK AVIVA PLUS) test strip Use to test blood sugar 3 times daily- Dx code E11.9 100 each 12  . HYDROcodone-acetaminophen (NORCO) 7.5-325 MG tablet     . insulin aspart (NOVOLOG FLEXPEN) 100 UNIT/ML FlexPen Inject 10 Units into the skin 3 (three) times daily with meals.    . Insulin Glargine (TOUJEO SOLOSTAR) 300 UNIT/ML SOPN Inject 30 Units into the skin daily. 3 pen 3  . lansoprazole (PREVACID) 15 MG capsule Take 15 mg by mouth every evening.     Marland Kitchen  levETIRAcetam (KEPPRA) 250 MG tablet Take 250-500 mg by mouth 2 (two) times daily. Dose depends on level of pain.    Marland Kitchen levothyroxine (SYNTHROID, LEVOTHROID) 100 MCG tablet TAKE 1 TABLET(100 MCG) BY MOUTH DAILY 90 tablet 3  . oxyCODONE-acetaminophen (PERCOCET/ROXICET) 5-325 MG tablet     . progesterone (PROMETRIUM) 100 MG capsule Take 1 capsule (100 mg total) by mouth at bedtime. 90 capsule 4  . torsemide (DEMADEX) 20 MG  tablet Take 20 mg by mouth daily.    Marland Kitchen trimethoprim (TRIMPEX) 100 MG tablet Take 100 mg by mouth every evening.   1  . VICTOZA 18 MG/3ML SOPN Inject 0.2 mLs (1.2 mg total) into the skin daily. Inject once daily at the same time 2 pen 3  . amLODipine (NORVASC) 5 MG tablet Take 1 tablet (5 mg total) by mouth daily. 30 tablet 1  . amLODipine-valsartan (EXFORGE) 10-320 MG tablet Take 1 tablet by mouth daily. Annual appt due in Sept must see provider for future refills (Patient not taking: Reported on 09/24/2017) 90 tablet 0  . ondansetron (ZOFRAN) 4 MG tablet TAKE 1 TABLET BY MOUTH EVERY 6 HOURS AS NEEDED FOR NAUSEA (Patient not taking: Reported on 09/24/2017) 30 tablet 0   No current facility-administered medications for this visit.     Allergies: Hydrocodone; Ciprofloxacin; and Penicillins  Past Medical History:  Diagnosis Date  . Anxiety   . Arthritis   . Blood dyscrasia    "FREE BLEEDER"  . CERVICAL RADICULOPATHY, LEFT   . Chronic back pain   . CKD (chronic kidney disease)    DR. SANFORD  [facility]  . COMMON MIGRAINE   . CORONARY ARTERY DISEASE   . Cough    CURRENT COLD  . Cystitis   . DEPRESSION   . Diabetes mellitus, type II ([place])   . DIVERTICULOSIS-COLON   . Gastric ulcer 04/2008  . Gastroparesis   . GERD (gastroesophageal reflux disease)   . Hiatal hernia   . Hyperlipidemia   . Hypertension   . Hypothyroidism   . INSOMNIA-SLEEP DISORDER-UNSPEC   . Iron deficiency anemia   . Wears glasses     Past Surgical History:  Procedure Laterality Date  . ABDOMINAL HYSTERECTOMY    . ANTERIOR LAT LUMBAR FUSION Left 08/13/2017   Procedure: LEFT SIDED LUMBAR 3-4 LATERAL INTERBODY FUSION WITH INSTRUMENTATION AND ALLOGRAFT;  Surgeon: Phylliss Bob, MD;  Location: [facility];  Service: Orthopedics;  Laterality: Left;  LEFT SIDED LUMBAR 3-4 LATERAL INTERBODY FUSION WITH INSTRUMENTATION AND ALLOGRAFT; REQUEST 3 HOURS  . BACK SURGERY  03/2016  . CHOLECYSTECTOMY  06/2009  . COLONOSCOPY     . ESOPHAGOGASTRODUODENOSCOPY    . EYE SURGERY Bilateral    lasik  . Gastric Wedge resection lipoma  11/2007   x2 with laparotomy and gastrostomy  . Left knee replacement    . Rigth knee replacement with revision  04/2008   Dr. Berenice Primas  . ROTATOR CUFF REPAIR Left 01/2009  . s/p bladder surgury  09/2009   Dr. Terance Hart    Family History  Problem Relation Age of Onset  . Diabetes Sister        x 3  . Heart disease Sister        x2  . Diabetes Brother        x3  . Heart disease Brother        x2  . Coronary artery disease Other        female 1st degree  . Hypertension Other   .  Colon cancer Neg Hx     Social History   Tobacco Use  . Smoking status: Former Research scientist (life sciences)  . Smokeless tobacco: Never Used  . Tobacco comment: quit 30 years ago  Substance Use Topics  . Alcohol use: No    Alcohol/week: 0.0 oz    Subjective:  Patient presents to discuss options for her blood pressure treatment. She is accompanied by her husband today. She notes that she has not taken Exforge for almost 2 months. She is continuing to take her Clonidine twice a day and Demadex as prescribed. She opted not to continue the Exforge due to concerns about the cost of the medication and recall concerns related to Valsartan. She wonders about other alternatives. Denies any chest pain, shortness of breath, blurred vision or headache. She is recovering form back surgery and continues to have home health in regularly. They have been monitoring her blood pressure and there have been no concerns about control with her being off the Exforge.   Objective:  Vitals:   09/24/17 1129  BP: 132/78  Pulse: 73  Temp: 97.9 F (36.6 C)  TempSrc: Oral  SpO2: 100%  Weight: 287 lb (130.2 kg)  Height: 5\' 6"  (1.676 m)    General: Well developed, well nourished, in no acute distress; in wrap around back brace; Skin : Warm and dry.  Head: Normocephalic and atraumatic  Eyes: Sclera and conjunctiva clear; pupils round and reactive to  light; extraocular movements intact  Lungs: Respirations unlabored; clear to auscultation bilaterally without wheeze, rales, rhonchi  CVS exam: normal rate and regular rhythm. Limited exam due to brace Extremities: No edema, cyanosis, clubbing  Neurologic: Alert and oriented; speech intact; face symmetrical; moves all extremities well; CNII-XII intact without focal deficit ; uses walker  Assessment:  1. Essential hypertension     Plan:  It is normal today but patient notes that her pressure has been kept "lower" in the past. Patient notes she has been off her Exforge for 1-2 months; wants an alternative due to cost/ Valsartan concerns; add Amlodipine 5 mg and continue Clonidine 1 mg bid and Torsemide; follow-up with her PCP in 1 month; follow-up sooner if home health notices concerns regarding blood pressure.   Return in about 4 weeks (around 10/22/2017) for with Dr. Jenny Reichmann.  No orders of the defined types were placed in this encounter.   Requested Prescriptions   Signed Prescriptions Disp Refills  . amLODipine (NORVASC) 5 MG tablet 30 tablet 1    Sig: Take 1 tablet (5 mg total) by mouth daily.

## 2017-09-26 DIAGNOSIS — Z9889 Other specified postprocedural states: Secondary | ICD-10-CM | POA: Diagnosis not present

## 2017-09-26 DIAGNOSIS — M545 Low back pain: Secondary | ICD-10-CM | POA: Diagnosis not present

## 2017-09-29 DIAGNOSIS — M48061 Spinal stenosis, lumbar region without neurogenic claudication: Secondary | ICD-10-CM | POA: Diagnosis not present

## 2017-09-29 DIAGNOSIS — Z4789 Encounter for other orthopedic aftercare: Secondary | ICD-10-CM | POA: Diagnosis not present

## 2017-10-03 ENCOUNTER — Encounter: Payer: Self-pay | Admitting: Gastroenterology

## 2017-10-09 ENCOUNTER — Ambulatory Visit: Payer: Medicare Other | Admitting: Endocrinology

## 2017-10-09 DIAGNOSIS — Z0289 Encounter for other administrative examinations: Secondary | ICD-10-CM

## 2017-10-15 DIAGNOSIS — M48061 Spinal stenosis, lumbar region without neurogenic claudication: Secondary | ICD-10-CM | POA: Diagnosis not present

## 2017-10-15 DIAGNOSIS — Z4789 Encounter for other orthopedic aftercare: Secondary | ICD-10-CM | POA: Diagnosis not present

## 2017-10-19 DIAGNOSIS — Z4789 Encounter for other orthopedic aftercare: Secondary | ICD-10-CM | POA: Diagnosis not present

## 2017-10-19 DIAGNOSIS — M48061 Spinal stenosis, lumbar region without neurogenic claudication: Secondary | ICD-10-CM | POA: Diagnosis not present

## 2017-10-22 ENCOUNTER — Ambulatory Visit (INDEPENDENT_AMBULATORY_CARE_PROVIDER_SITE_OTHER): Payer: Medicare Other | Admitting: Internal Medicine

## 2017-10-22 ENCOUNTER — Encounter: Payer: Self-pay | Admitting: Internal Medicine

## 2017-10-22 VITALS — BP 134/82 | HR 79 | Temp 98.3°F | Ht 66.0 in | Wt 290.0 lb

## 2017-10-22 DIAGNOSIS — I10 Essential (primary) hypertension: Secondary | ICD-10-CM | POA: Diagnosis not present

## 2017-10-22 DIAGNOSIS — Z794 Long term (current) use of insulin: Secondary | ICD-10-CM | POA: Diagnosis not present

## 2017-10-22 DIAGNOSIS — E785 Hyperlipidemia, unspecified: Secondary | ICD-10-CM

## 2017-10-22 DIAGNOSIS — M48061 Spinal stenosis, lumbar region without neurogenic claudication: Secondary | ICD-10-CM | POA: Diagnosis not present

## 2017-10-22 DIAGNOSIS — E0822 Diabetes mellitus due to underlying condition with diabetic chronic kidney disease: Secondary | ICD-10-CM | POA: Diagnosis not present

## 2017-10-22 DIAGNOSIS — F329 Major depressive disorder, single episode, unspecified: Secondary | ICD-10-CM | POA: Diagnosis not present

## 2017-10-22 DIAGNOSIS — Z4789 Encounter for other orthopedic aftercare: Secondary | ICD-10-CM | POA: Diagnosis not present

## 2017-10-22 DIAGNOSIS — Z1159 Encounter for screening for other viral diseases: Secondary | ICD-10-CM | POA: Diagnosis not present

## 2017-10-22 MED ORDER — LOSARTAN POTASSIUM 100 MG PO TABS: 100.0000 mg | ORAL_TABLET | Freq: Every day | ORAL | 3 refills | Status: DC

## 2017-10-22 MED ORDER — FLUOXETINE HCL 20 MG PO CAPS
ORAL_CAPSULE | ORAL | 3 refills | Status: DC
Start: 2017-10-22 — End: 2018-11-12

## 2017-10-22 NOTE — Assessment & Plan Note (Signed)
Lab Results  Component Value Date   LDLCALC 173 (H) 05/13/2017  severe uncontrolled recently, urged lipitor compliance, low chol diet

## 2017-10-22 NOTE — Patient Instructions (Signed)
Ok to change the amlodipine to the Losartan 100 mg, which should be better for protecting your kidneys, and should not cause worsening kidney function as the Valsartan medication did not do this with last Nov 2018 labs  Please take all new medication as prescribe - restarting the prozac  Please continue all other medications as before, and refills have been done if requested.  Please have the pharmacy call with any other refills you may need.  Please continue your efforts at being more active, low cholesterol diabetic diet, and weight control.  You are otherwise up to date with prevention measures today.  Please keep your appointments with your specialists as you may have planned  - Dr Dwyane Dee  Please return in 3 months, or sooner if needed

## 2017-10-22 NOTE — Progress Notes (Signed)
Subjective:    Patient ID: [name], female    DOB: [birth year], 67 y.o.   MRN: [id number]  HPI  Here for yearly f/u;  Overall doing ok;  Pt denies Chest pain, worsening SOB, DOE, wheezing, orthopnea, PND, worsening LE edema, palpitations, dizziness or syncope.  Pt denies neurological change such as new headache, facial or extremity weakness.  Pt denies polydipsia, polyuria, or low sugar symptoms. Pt states overall good compliance with treatment and medications, good tolerability, and has been trying to follow appropriate diet.  Pt denies worsening depressive symptoms, suicidal ideation or panic. No fever, night sweats, wt loss, loss of appetite, or other constitutional symptoms.  Pt states good ability with ADL's, has low fall risk, home safety reviewed and adequate, no other significant changes in hearing or vision, and not active with exercise.  Pt continues to have recurring LBP without change in severity, bowel or bladder change, fever, wt loss,  worsening LE pain/numbness/weakness, gait change or falls.  Has worsening pain despite PT and more depressed associated recently.  Plans to f/u with GYN regarding missed mamogram and dxa.  Declines Tdap.   Past Medical History:  Diagnosis Date  . Anxiety   . Arthritis   . Blood dyscrasia    "FREE BLEEDER"  . CERVICAL RADICULOPATHY, LEFT   . Chronic back pain   . CKD (chronic kidney disease)    DR. SANFORD  [facility]  . COMMON MIGRAINE   . CORONARY ARTERY DISEASE   . Cough    CURRENT COLD  . Cystitis   . DEPRESSION   . Diabetes mellitus, type II ([place])   . DIVERTICULOSIS-COLON   . Gastric ulcer 04/2008  . Gastroparesis   . GERD (gastroesophageal reflux disease)   . Hiatal hernia   . Hyperlipidemia   . Hypertension   . Hypothyroidism   . INSOMNIA-SLEEP DISORDER-UNSPEC   . Iron deficiency anemia   . Wears glasses    Past Surgical History:  Procedure Laterality Date  . ABDOMINAL HYSTERECTOMY    . ANTERIOR LAT LUMBAR FUSION  Left 08/13/2017   Procedure: LEFT SIDED LUMBAR 3-4 LATERAL INTERBODY FUSION WITH INSTRUMENTATION AND ALLOGRAFT;  Surgeon: Phylliss Bob, MD;  Location: [facility];  Service: Orthopedics;  Laterality: Left;  LEFT SIDED LUMBAR 3-4 LATERAL INTERBODY FUSION WITH INSTRUMENTATION AND ALLOGRAFT; REQUEST 3 HOURS  . BACK SURGERY  03/2016  . CHOLECYSTECTOMY  06/2009  . COLONOSCOPY    . ESOPHAGOGASTRODUODENOSCOPY    . EYE SURGERY Bilateral    lasik  . Gastric Wedge resection lipoma  11/2007   x2 with laparotomy and gastrostomy  . Left knee replacement    . Rigth knee replacement with revision  04/2008   Dr. Berenice Primas  . ROTATOR CUFF REPAIR Left 01/2009  . s/p bladder surgury  09/2009   Dr. Terance Hart    reports that she has quit smoking. she has never used smokeless tobacco. She reports that she does not drink alcohol or use drugs. family history includes Coronary artery disease in her other; Diabetes in her brother and sister; Heart disease in her brother and sister; Hypertension in her other. Allergies  Allergen Reactions  . Hydrocodone Other (See Comments)    Tachycardia   . Ciprofloxacin Other (See Comments)    Dizziness   . Penicillins Hives    Has patient had a PCN reaction causing immediate rash, facial/tongue/throat swelling, SOB or lightheadedness with hypotension: No Has patient had a PCN reaction causing severe rash involving mucus membranes or  skin necrosis: No Has patient had a PCN reaction that required hospitalization No Has patient had a PCN reaction occurring within the last 10 years: No If all of the above answers are "NO", then may proceed with Cephalosporin use.    Current Outpatient Medications on File Prior to Visit  Medication Sig Dispense Refill  . amitriptyline (ELAVIL) 10 MG tablet Take 10 mg by mouth at bedtime.     Marland Kitchen aspirin 81 MG EC tablet Take 81 mg by mouth every evening.     Marland Kitchen atorvastatin (LIPITOR) 20 MG tablet Take 1 tablet (20 mg total) by mouth daily. 90 tablet 3  .  cloNIDine (CATAPRES) 0.1 MG tablet TAKE ONE TABLET BY MOUTH TWICE A DAY 60 tablet 3  . ELMIRON 100 MG capsule Take 100 mg by mouth 2 (two) times daily.     Marland Kitchen estradiol (CLIMARA - DOSED IN MG/24 HR) 0.1 mg/24hr patch Place 1 patch (0.1 mg total) onto the skin once a week. 12 patch 4  . ferrous sulfate 325 (65 FE) MG tablet Take 325 mg by mouth daily with breakfast.     . gabapentin (NEURONTIN) 600 MG tablet Take 600 mg by mouth 2 (two) times daily.    Marland Kitchen glucose blood (ACCU-CHEK AVIVA PLUS) test strip Use to test blood sugar 3 times daily- Dx code E11.9 100 each 12  . HYDROcodone-acetaminophen (NORCO) 7.5-325 MG tablet     . insulin aspart (NOVOLOG FLEXPEN) 100 UNIT/ML FlexPen Inject 10 Units into the skin 3 (three) times daily with meals.    . Insulin Glargine (TOUJEO SOLOSTAR) 300 UNIT/ML SOPN Inject 30 Units into the skin daily. 3 pen 3  . lansoprazole (PREVACID) 15 MG capsule Take 15 mg by mouth every evening.     . levETIRAcetam (KEPPRA) 250 MG tablet Take 250-500 mg by mouth 2 (two) times daily. Dose depends on level of pain.    Marland Kitchen levothyroxine (SYNTHROID, LEVOTHROID) 100 MCG tablet TAKE 1 TABLET(100 MCG) BY MOUTH DAILY 90 tablet 3  . ondansetron (ZOFRAN) 4 MG tablet TAKE 1 TABLET BY MOUTH EVERY 6 HOURS AS NEEDED FOR NAUSEA 30 tablet 0  . oxyCODONE-acetaminophen (PERCOCET/ROXICET) 5-325 MG tablet     . progesterone (PROMETRIUM) 100 MG capsule Take 1 capsule (100 mg total) by mouth at bedtime. 90 capsule 4  . torsemide (DEMADEX) 20 MG tablet Take 20 mg by mouth daily.    Marland Kitchen trimethoprim (TRIMPEX) 100 MG tablet Take 100 mg by mouth every evening.   1  . VICTOZA 18 MG/3ML SOPN Inject 0.2 mLs (1.2 mg total) into the skin daily. Inject once daily at the same time 2 pen 3   No current facility-administered medications on file prior to visit.    Review of Systems  Constitutional: Negative for other unusual diaphoresis or sweats HENT: Negative for ear discharge or swelling Eyes: Negative for other  worsening visual disturbances Respiratory: Negative for stridor or other swelling  Gastrointestinal: Negative for worsening distension or other blood Genitourinary: Negative for retention or other urinary change Musculoskeletal: Negative for other MSK pain or swelling Skin: Negative for color change or other new lesions Neurological: Negative for worsening tremors and other numbness  Psychiatric/Behavioral: Negative for worsening agitation or other fatigue All other system neg per pt    Objective:   Physical Exam BP 134/82   Pulse 79   Temp 98.3 F (36.8 C) (Oral)   Ht 5\' 6"  (1.676 m)   Wt 290 lb (131.5 kg)   SpO2 100%  BMI 46.81 kg/m  VS noted,  Constitutional: Pt appears in NAD HENT: Head: NCAT.  Right Ear: External ear normal.  Left Ear: External ear normal.  Eyes: . Pupils are equal, round, and reactive to light. Conjunctivae and EOM are normal Nose: without d/c or deformity Neck: Neck supple. Gross normal ROM Cardiovascular: Normal rate and regular rhythm.   Pulmonary/Chest: Effort normal and breath sounds without rales or wheezing.  Abd:  Soft, NT, ND, + BS, no organomegaly; wearing back brace Neurological: Pt is alert. At baseline orientation, motor grossly intact Skin: Skin is warm. No rashes, other new lesions, no LE edema Psychiatric: Pt behavior is normal without agitation , + depressed affect No other exam findings  Lab Results  Component Value Date   WBC 7.5 08/07/2017   HGB 9.2 (L) 08/14/2017   HCT 27.0 (L) 08/14/2017   PLT 270 08/07/2017   GLUCOSE 132 (H) 08/14/2017   CHOL 249 (H) 05/13/2017   TRIG 123.0 05/13/2017   HDL 52.30 05/13/2017   LDLDIRECT 156.6 01/07/2011   LDLCALC 173 (H) 05/13/2017   ALT 18 08/07/2017   AST 16 08/07/2017   NA 141 08/14/2017   K 6.1 (H) 08/14/2017   CL 108 08/07/2017   CREATININE 1.39 (H) 08/07/2017   BUN 22 (H) 08/07/2017   CO2 24 08/07/2017   TSH 1.510 06/24/2017   INR 1.21 08/07/2017   HGBA1C 7.9 (H) 08/07/2017    MICROALBUR 14.7 (H) 05/13/2017       Assessment & Plan:

## 2017-10-22 NOTE — Assessment & Plan Note (Addendum)
stable overall by history and exam, recent data reviewed with pt, and pt to change the amlodipine to losartan 100 qd for renoprotective despite her known CKD with an eye to follow Cr closely,  to f/u any worsening symptoms or concerns BP Readings from Last 3 Encounters:  10/22/17 134/82  09/24/17 132/78  08/19/17 (!) 98/55

## 2017-10-22 NOTE — Assessment & Plan Note (Signed)
To f/u endo as planned, cont to work on diet and activity

## 2017-10-22 NOTE — Assessment & Plan Note (Signed)
Mild to mod, denies SI or HI, for restart prozac 20 qd,  to f/u any worsening symptoms or concerns

## 2017-10-26 DIAGNOSIS — I2510 Atherosclerotic heart disease of native coronary artery without angina pectoris: Secondary | ICD-10-CM | POA: Diagnosis not present

## 2017-10-26 DIAGNOSIS — Z87891 Personal history of nicotine dependence: Secondary | ICD-10-CM

## 2017-10-26 DIAGNOSIS — E669 Obesity, unspecified: Secondary | ICD-10-CM

## 2017-10-26 DIAGNOSIS — F329 Major depressive disorder, single episode, unspecified: Secondary | ICD-10-CM | POA: Diagnosis not present

## 2017-10-26 DIAGNOSIS — K259 Gastric ulcer, unspecified as acute or chronic, without hemorrhage or perforation: Secondary | ICD-10-CM

## 2017-10-26 DIAGNOSIS — Z4789 Encounter for other orthopedic aftercare: Secondary | ICD-10-CM | POA: Diagnosis not present

## 2017-10-26 DIAGNOSIS — D759 Disease of blood and blood-forming organs, unspecified: Secondary | ICD-10-CM | POA: Diagnosis not present

## 2017-10-26 DIAGNOSIS — K5732 Diverticulitis of large intestine without perforation or abscess without bleeding: Secondary | ICD-10-CM

## 2017-10-26 DIAGNOSIS — N189 Chronic kidney disease, unspecified: Secondary | ICD-10-CM | POA: Diagnosis not present

## 2017-10-26 DIAGNOSIS — G4700 Insomnia, unspecified: Secondary | ICD-10-CM

## 2017-10-26 DIAGNOSIS — K449 Diaphragmatic hernia without obstruction or gangrene: Secondary | ICD-10-CM

## 2017-10-26 DIAGNOSIS — Z7982 Long term (current) use of aspirin: Secondary | ICD-10-CM

## 2017-10-26 DIAGNOSIS — M5412 Radiculopathy, cervical region: Secondary | ICD-10-CM

## 2017-10-26 DIAGNOSIS — Z794 Long term (current) use of insulin: Secondary | ICD-10-CM

## 2017-10-26 DIAGNOSIS — E1122 Type 2 diabetes mellitus with diabetic chronic kidney disease: Secondary | ICD-10-CM

## 2017-10-26 DIAGNOSIS — K219 Gastro-esophageal reflux disease without esophagitis: Secondary | ICD-10-CM

## 2017-10-26 DIAGNOSIS — G43009 Migraine without aura, not intractable, without status migrainosus: Secondary | ICD-10-CM

## 2017-10-26 DIAGNOSIS — D509 Iron deficiency anemia, unspecified: Secondary | ICD-10-CM

## 2017-10-26 DIAGNOSIS — K3184 Gastroparesis: Secondary | ICD-10-CM

## 2017-10-26 DIAGNOSIS — E1143 Type 2 diabetes mellitus with diabetic autonomic (poly)neuropathy: Secondary | ICD-10-CM

## 2017-10-26 DIAGNOSIS — F419 Anxiety disorder, unspecified: Secondary | ICD-10-CM | POA: Diagnosis not present

## 2017-10-26 DIAGNOSIS — E039 Hypothyroidism, unspecified: Secondary | ICD-10-CM

## 2017-10-26 DIAGNOSIS — M1991 Primary osteoarthritis, unspecified site: Secondary | ICD-10-CM | POA: Diagnosis not present

## 2017-10-26 DIAGNOSIS — E785 Hyperlipidemia, unspecified: Secondary | ICD-10-CM

## 2017-10-26 DIAGNOSIS — I129 Hypertensive chronic kidney disease with stage 1 through stage 4 chronic kidney disease, or unspecified chronic kidney disease: Secondary | ICD-10-CM | POA: Diagnosis not present

## 2017-10-26 DIAGNOSIS — Z6841 Body Mass Index (BMI) 40.0 and over, adult: Secondary | ICD-10-CM

## 2017-10-26 DIAGNOSIS — M48061 Spinal stenosis, lumbar region without neurogenic claudication: Secondary | ICD-10-CM

## 2017-10-26 DIAGNOSIS — Z981 Arthrodesis status: Secondary | ICD-10-CM

## 2017-10-28 DIAGNOSIS — Z4789 Encounter for other orthopedic aftercare: Secondary | ICD-10-CM | POA: Diagnosis not present

## 2017-10-28 DIAGNOSIS — M48061 Spinal stenosis, lumbar region without neurogenic claudication: Secondary | ICD-10-CM | POA: Diagnosis not present

## 2017-11-03 ENCOUNTER — Encounter: Payer: Self-pay | Admitting: Podiatry

## 2017-11-03 ENCOUNTER — Ambulatory Visit (INDEPENDENT_AMBULATORY_CARE_PROVIDER_SITE_OTHER): Payer: Medicare Other | Admitting: Podiatry

## 2017-11-03 DIAGNOSIS — L600 Ingrowing nail: Secondary | ICD-10-CM | POA: Diagnosis not present

## 2017-11-03 DIAGNOSIS — M779 Enthesopathy, unspecified: Secondary | ICD-10-CM | POA: Diagnosis not present

## 2017-11-03 MED ORDER — TRIAMCINOLONE ACETONIDE 10 MG/ML IJ SUSP
10.0000 mg | Freq: Once | INTRAMUSCULAR | Status: AC
  Administered 2017-11-03: 10 mg

## 2017-11-03 NOTE — Progress Notes (Signed)
Subjective:   Patient ID: [name], female   DOB: 67 y.o.   MRN: [id number]   HPI Patient presents with inflammation around the big toe joint left and states it becomes tender and also in the left ankle on the medial side.  Patient has no other current pathology and states it has been bothering her for the last 6 weeks   ROS      Objective:  Physical Exam  Neurovascular status intact with patient's left first MPJ being moderately tender with patient also noted to have discomfort in the posterior tib tendon left with moderate depression of the arch     Assessment:  Inflammatory tendinitis left posterior tib with inflammation of the capsule of the first metatarsal phalangeal joint left     Plan:  H&P conditions reviewed and injected around the first MPJ left 3 mg Kenalog 5 mg Xylocaine applied fascial brace to lift the arch and discussed possible other treatment for the posterior tib depending on response to conservative treatment  X-ray indicates moderate depression of the arch with no indications of stress fracture or arthritis

## 2017-11-05 DIAGNOSIS — Z4789 Encounter for other orthopedic aftercare: Secondary | ICD-10-CM | POA: Diagnosis not present

## 2017-11-05 DIAGNOSIS — M48061 Spinal stenosis, lumbar region without neurogenic claudication: Secondary | ICD-10-CM | POA: Diagnosis not present

## 2017-11-07 DIAGNOSIS — M48062 Spinal stenosis, lumbar region with neurogenic claudication: Secondary | ICD-10-CM | POA: Diagnosis not present

## 2017-11-10 ENCOUNTER — Encounter: Payer: Self-pay | Admitting: Podiatry

## 2017-11-10 ENCOUNTER — Ambulatory Visit (INDEPENDENT_AMBULATORY_CARE_PROVIDER_SITE_OTHER): Payer: Medicare Other | Admitting: Podiatry

## 2017-11-10 DIAGNOSIS — M84374D Stress fracture, right foot, subsequent encounter for fracture with routine healing: Secondary | ICD-10-CM

## 2017-11-10 DIAGNOSIS — M779 Enthesopathy, unspecified: Secondary | ICD-10-CM | POA: Diagnosis not present

## 2017-11-11 DIAGNOSIS — M545 Low back pain: Secondary | ICD-10-CM | POA: Diagnosis not present

## 2017-11-11 DIAGNOSIS — M6281 Muscle weakness (generalized): Secondary | ICD-10-CM | POA: Diagnosis not present

## 2017-11-11 DIAGNOSIS — M4326 Fusion of spine, lumbar region: Secondary | ICD-10-CM | POA: Diagnosis not present

## 2017-11-12 NOTE — Progress Notes (Signed)
Subjective:   Patient ID: [name], female   DOB: 67 y.o.   MRN: [id number]   HPI Patient states that she still having a lot of pain in her left arch after having had injection and states it feels like she has no support on her left side   ROS      Objective:  Physical Exam  Neurovascular status unchanged with significant diminishment of the arch height left with inflammation around the posterior tibial tendon near its insertion navicular     Assessment:  Possibilities for tear of the posterior tibial tendon versus inflammatory process with depression of the arch     Plan:  H&P condition reviewed and dispensed an air fracture walker to try to reduce all weightbearing pressure on the posterior tibial tendon.  Discussed possibility for MRI depending on response and that at this point she needs to try to be off her foot is much as she can

## 2017-11-23 ENCOUNTER — Other Ambulatory Visit: Payer: Self-pay | Admitting: Family

## 2017-11-24 DIAGNOSIS — M79604 Pain in right leg: Secondary | ICD-10-CM | POA: Diagnosis not present

## 2017-11-24 DIAGNOSIS — R2689 Other abnormalities of gait and mobility: Secondary | ICD-10-CM | POA: Diagnosis not present

## 2017-11-24 DIAGNOSIS — M545 Low back pain: Secondary | ICD-10-CM | POA: Diagnosis not present

## 2017-12-01 ENCOUNTER — Ambulatory Visit: Payer: Medicare Other | Admitting: Podiatry

## 2017-12-06 ENCOUNTER — Other Ambulatory Visit: Payer: Self-pay | Admitting: Endocrinology

## 2017-12-08 ENCOUNTER — Other Ambulatory Visit: Payer: Self-pay

## 2017-12-08 MED ORDER — GLUCOSE BLOOD VI STRP: ORAL_STRIP | 2 refills | Status: DC

## 2017-12-09 ENCOUNTER — Other Ambulatory Visit: Payer: Self-pay

## 2017-12-09 MED ORDER — GLUCOSE BLOOD VI STRP: ORAL_STRIP | 2 refills | Status: DC

## 2017-12-25 ENCOUNTER — Encounter: Payer: Self-pay | Admitting: Podiatry

## 2017-12-25 ENCOUNTER — Ambulatory Visit (INDEPENDENT_AMBULATORY_CARE_PROVIDER_SITE_OTHER): Payer: Medicare Other | Admitting: Podiatry

## 2017-12-25 DIAGNOSIS — M79675 Pain in left toe(s): Secondary | ICD-10-CM | POA: Diagnosis not present

## 2017-12-25 DIAGNOSIS — B351 Tinea unguium: Secondary | ICD-10-CM

## 2017-12-25 DIAGNOSIS — M79674 Pain in right toe(s): Secondary | ICD-10-CM | POA: Diagnosis not present

## 2017-12-25 DIAGNOSIS — M779 Enthesopathy, unspecified: Secondary | ICD-10-CM

## 2017-12-29 DIAGNOSIS — M545 Low back pain: Secondary | ICD-10-CM | POA: Diagnosis not present

## 2017-12-31 ENCOUNTER — Ambulatory Visit (INDEPENDENT_AMBULATORY_CARE_PROVIDER_SITE_OTHER): Payer: Medicare Other | Admitting: Endocrinology

## 2017-12-31 ENCOUNTER — Encounter: Payer: Self-pay | Admitting: Endocrinology

## 2017-12-31 VITALS — BP 136/80 | HR 110 | Ht 66.0 in | Wt 297.0 lb

## 2017-12-31 DIAGNOSIS — E1169 Type 2 diabetes mellitus with other specified complication: Secondary | ICD-10-CM

## 2017-12-31 DIAGNOSIS — Z794 Long term (current) use of insulin: Secondary | ICD-10-CM

## 2017-12-31 DIAGNOSIS — E890 Postprocedural hypothyroidism: Secondary | ICD-10-CM | POA: Diagnosis not present

## 2017-12-31 DIAGNOSIS — E669 Obesity, unspecified: Secondary | ICD-10-CM

## 2017-12-31 DIAGNOSIS — E1165 Type 2 diabetes mellitus with hyperglycemia: Secondary | ICD-10-CM | POA: Diagnosis not present

## 2017-12-31 DIAGNOSIS — E782 Mixed hyperlipidemia: Secondary | ICD-10-CM | POA: Diagnosis not present

## 2017-12-31 LAB — LIPID PANEL
Cholesterol: 188 mg/dL (ref 0–200)
HDL: 59.4 mg/dL (ref >39.00)
LDL Cholesterol: 114 mg/dL — ABNORMAL HIGH (ref 0–99)
NonHDL: 128.91
Total CHOL/HDL Ratio: 3
Triglycerides: 75 mg/dL (ref 0.0–149.0)
VLDL: 15 mg/dL (ref 0.0–40.0)

## 2017-12-31 LAB — MICROALBUMIN / CREATININE URINE RATIO
Creatinine,U: 274.3 mg/dL
Microalb Creat Ratio: 0.7 mg/g (ref 0.0–30.0)
Microalb, Ur: 2 mg/dL — ABNORMAL HIGH (ref 0.0–1.9)

## 2017-12-31 LAB — T4, FREE: Free T4: 0.53 ng/dL — ABNORMAL LOW (ref 0.60–1.60)

## 2017-12-31 LAB — POCT GLYCOSYLATED HEMOGLOBIN (HGB A1C): Hemoglobin A1C: 6.1

## 2017-12-31 LAB — COMPREHENSIVE METABOLIC PANEL
ALT: 26 U/L (ref 0–35)
AST: 19 U/L (ref 0–37)
Albumin: 3.7 g/dL (ref 3.5–5.2)
Alkaline Phosphatase: 111 U/L (ref 39–117)
BUN: 29 mg/dL — ABNORMAL HIGH (ref 6–23)
CO2: 24 mEq/L (ref 19–32)
Calcium: 9.4 mg/dL (ref 8.4–10.5)
Chloride: 104 mEq/L (ref 96–112)
Creatinine, Ser: 1.67 mg/dL — ABNORMAL HIGH (ref 0.40–1.20)
GFR: 39.25 mL/min — ABNORMAL LOW (ref >60.00)
Glucose, Bld: 274 mg/dL — ABNORMAL HIGH (ref 70–99)
Potassium: 4.8 mEq/L (ref 3.5–5.1)
Sodium: 136 mEq/L (ref 135–145)
Total Bilirubin: 0.7 mg/dL (ref 0.2–1.2)
Total Protein: 7.4 g/dL (ref 6.0–8.3)

## 2017-12-31 LAB — TSH: TSH: 10.81 u[IU]/mL — ABNORMAL HIGH (ref 0.35–4.50)

## 2017-12-31 MED ORDER — INSULIN GLARGINE-LIXISENATIDE 100-33 UNT-MCG/ML ~~LOC~~ SOPN: 30.0000 [IU] | PEN_INJECTOR | Freq: Every day | SUBCUTANEOUS | 0 refills | Status: DC

## 2017-12-31 NOTE — Progress Notes (Signed)
Sherry SCHNOEBELEN is a 67 y.o. female.    Reason for Appointment: Diabetes follow-up   History of Present Illness   Diagnosis: Type 2 DIABETES MELITUS, date of diagnosis: 2000  Previous history: She had initially been treated with metformin and Amaryl and subsequently given Victoza which helped overall control and weight loss. In 9/13 because of significant hyperglycemia she was started on basal bolus insulin also. Had required relatively small doses of insulin. Her blood sugar control has been excellent with A1c between 5.3-6.0 although this may be relatively higher than expected. Tends to have relatively higher readings after supper In 6/15 she was told to resume her Victoza and stop her Lantus since she had been gaining weight with Lantus and NovoLog regimen.   Recent history:     INSULIN: Novolog 10units before meals; Toujeo 30  units daily hs   Non-insulin hypoglycemic drugs: None   Her A1c is much lower than expected at 6.1, previously 7.9  Current blood sugar patterns and management:  She has not been seen in follow-up since 10/18  She has gained 20 pounds since her last visit  Although she was previously taking 15 units of NovoLog before dinner she is now taking only 10 units, she does however try to take it before eating  Despite her blood sugars averaging well over 200 at bedtime she does not take higher doses of NovoLog to cover her evening meal  She tends to have high postprandial blood sugars regardless of her meal size  She will frequently have some form of bread at breakfast which is around noon but she does not check her sugars after eating and does not take any NOVOLOG with this meal  Previously metformin was stopped because of renal dysfunction  FASTING readings are overall high but as low as 116; she does not know how to explain her high reading of 185 this morning despite not having any food after dinner or missed insulin last evening  Has been  minimally active because of her continued back pain     Side effects from medications:  none  Monitors blood glucose: Once a day or less on average .    Glucometer:  Accu-Chek .          Blood Glucose readings as   Mean values apply above for all meters except median for One Touch  PRE-MEAL Fasting Lunch Dinner Bedtime Overall  Glucose range: 116-185    174-126   Mean/median: 144   236  185   POST-MEAL PC Breakfast PC Lunch PC Dinner  Glucose range: ?    Mean/median:       Previous readings:  Mean values apply above for all meters except median for One Touch  PRE-MEAL Fasting Lunch Dinner Bedtime Overall  Glucose range: 70-3 79 129   1 20-283    Mean/median: 185    208  199    POST-MEAL PC Breakfast PC Lunch PC Dinner  Glucose range:   1 34-255    Mean/median:       Meals: 1-2 meals per day. Lunch Supper at 7 PM pm           Physical activity: exercise: Limited, has had fatigue, back and leg pain.            Dietician visit: Most recent: 06/7672            Complications: are: None  Wt Readings from Last 3 Encounters:  12/31/17 297 lb (134.7 kg)  10/22/17 290 lb (131.5 kg)  09/24/17 287 lb (130.2 kg)    LABS:  Lab Results  Component Value Date   HGBA1C 6.1 12/31/2017   HGBA1C 7.9 (H) 08/07/2017   HGBA1C 8.2 (H) 05/13/2017   Lab Results  Component Value Date   MICROALBUR 14.7 (H) 05/13/2017   LDLCALC 173 (H) 05/13/2017   CREATININE 1.39 (H) 08/07/2017    Other active problems: See review of systems   Office Visit on 12/31/2017  Component Date Value Ref Range Status  . Hemoglobin A1C 12/31/2017 6.1   Final    Allergies as of 12/31/2017      Reactions   Hydrocodone Other (See Comments)   Tachycardia   Ciprofloxacin Other (See Comments)   Dizziness   Penicillins Hives   Has patient had a PCN reaction causing immediate rash, facial/tongue/throat swelling, SOB or lightheadedness with hypotension: No Has patient had a PCN reaction causing severe rash  involving mucus membranes or skin necrosis: No Has patient had a PCN reaction that required hospitalization No Has patient had a PCN reaction occurring within the last 10 years: No If all of the above answers are "NO", then may proceed with Cephalosporin use.      Medication List        Accurate as of 12/31/17  1:32 PM. Always use your most recent med list.          acetaminophen-codeine 300-30 MG tablet Commonly known as:  TYLENOL #3   aspirin 81 MG EC tablet Take 81 mg by mouth every evening.   atorvastatin 20 MG tablet Commonly known as:  LIPITOR Take 1 tablet (20 mg total) by mouth daily.   ELMIRON 100 MG capsule Generic drug:  pentosan polysulfate Take 100 mg by mouth 2 (two) times daily.   estradiol 0.1 mg/24hr patch Commonly known as:  CLIMARA - Dosed in mg/24 hr Place 1 patch (0.1 mg total) onto the skin once a week.   ferrous sulfate 325 (65 FE) MG tablet Take 325 mg by mouth daily with breakfast.   FLUoxetine 20 MG capsule Commonly known as:  PROZAC TAKE 20mg  BY MOUTH DAILY   glucose blood test strip Commonly known as:  ACCU-CHEK AVIVA PLUS USE TO TEST BLOOD SUGAR 4 TO 6 TIMES DAILY DX CODE E08.22   HYDROcodone-acetaminophen 7.5-325 MG tablet Commonly known as:  NORCO   Insulin Glargine 300 UNIT/ML Sopn Commonly known as:  TOUJEO SOLOSTAR Inject 30 Units into the skin daily.   levETIRAcetam 250 MG tablet Commonly known as:  KEPPRA Take 250-500 mg by mouth 2 (two) times daily. Dose depends on level of pain.   levothyroxine 100 MCG tablet Commonly known as:  SYNTHROID, LEVOTHROID TAKE 1 TABLET(100 MCG) BY MOUTH DAILY   losartan 100 MG tablet Commonly known as:  COZAAR Take 1 tablet (100 mg total) by mouth daily.   NOVOLOG FLEXPEN 100 UNIT/ML FlexPen Generic drug:  insulin aspart Inject 10 Units into the skin 3 (three) times daily with meals.   ondansetron 4 MG tablet Commonly known as:  ZOFRAN TAKE 1 TABLET BY MOUTH EVERY 6 HOURS AS NEEDED  FOR NAUSEA   progesterone 100 MG capsule Commonly known as:  PROMETRIUM Take 1 capsule (100 mg total) by mouth at bedtime.   torsemide 20 MG tablet Commonly known as:  DEMADEX Take 20 mg by mouth daily.   trimethoprim 100 MG tablet Commonly known as:  TRIMPEX Take 100 mg by mouth every evening.       Allergies:  Allergies  Allergen Reactions  . Hydrocodone Other (See Comments)    Tachycardia   . Ciprofloxacin Other (See Comments)    Dizziness   . Penicillins Hives    Has patient had a PCN reaction causing immediate rash, facial/tongue/throat swelling, SOB or lightheadedness with hypotension: No Has patient had a PCN reaction causing severe rash involving mucus membranes or skin necrosis: No Has patient had a PCN reaction that required hospitalization No Has patient had a PCN reaction occurring within the last 10 years: No If all of the above answers are "NO", then may proceed with Cephalosporin use.     Past Medical History:  Diagnosis Date  . Anxiety   . Arthritis   . Blood dyscrasia    "FREE BLEEDER"  . CERVICAL RADICULOPATHY, LEFT   . Chronic back pain   . CKD (chronic kidney disease)    DR. SANFORD  [facility]  . COMMON MIGRAINE   . CORONARY ARTERY DISEASE   . Cough    CURRENT COLD  . Cystitis   . DEPRESSION   . Diabetes mellitus, type II ([place])   . DIVERTICULOSIS-COLON   . Gastric ulcer 04/2008  . Gastroparesis   . GERD (gastroesophageal reflux disease)   . Hiatal hernia   . Hyperlipidemia   . Hypertension   . Hypothyroidism   . INSOMNIA-SLEEP DISORDER-UNSPEC   . Iron deficiency anemia   . Wears glasses     Past Surgical History:  Procedure Laterality Date  . ABDOMINAL HYSTERECTOMY    . ANTERIOR LAT LUMBAR FUSION Left 08/13/2017   Procedure: LEFT SIDED LUMBAR 3-4 LATERAL INTERBODY FUSION WITH INSTRUMENTATION AND ALLOGRAFT;  Surgeon: Phylliss Bob, MD;  Location: [facility];  Service: Orthopedics;  Laterality: Left;  LEFT SIDED LUMBAR 3-4 LATERAL  INTERBODY FUSION WITH INSTRUMENTATION AND ALLOGRAFT; REQUEST 3 HOURS  . BACK SURGERY  03/2016  . CHOLECYSTECTOMY  06/2009  . COLONOSCOPY    . ESOPHAGOGASTRODUODENOSCOPY    . EYE SURGERY Bilateral    lasik  . Gastric Wedge resection lipoma  11/2007   x2 with laparotomy and gastrostomy  . Left knee replacement    . Rigth knee replacement with revision  04/2008   Dr. Berenice Primas  . ROTATOR CUFF REPAIR Left 01/2009  . s/p bladder surgury  09/2009   Dr. Terance Hart    Family History  Problem Relation Age of Onset  . Diabetes Sister        x 3  . Heart disease Sister        x2  . Diabetes Brother        x3  . Heart disease Brother        x2  . Coronary artery disease Other        female 1st degree  . Hypertension Other   . Colon cancer Neg Hx     Social History:  reports that she has quit smoking. She has never used smokeless tobacco. She reports that she does not drink alcohol or use drugs.  Review of Systems:  Hypertension: Her blood pressure is treated with metoprolol, clonidine Twice a day and Exforge Followed by PCP  CKD: Her creatinine is moderately increased, needs regular follow-up   Lab Results  Component Value Date   CREATININE 1.39 (H) 08/07/2017   CREATININE 1.99 (H) 05/13/2017   CREATININE 1.96 (H) 05/03/2017   CREATININE 3.86 (H) 05/02/2017     Lipids: Followed by PCP, LDL has been inconsistently controlled She is also on amlodipine Taking Lipitor 20  mg,  lipids are not consistently controlled  Lab Results  Component Value Date   CHOL 249 (H) 05/13/2017   HDL 52.30 05/13/2017   LDLCALC 173 (H) 05/13/2017   LDLDIRECT 156.6 01/07/2011   TRIG 123.0 05/13/2017   CHOLHDL 5 05/13/2017     Post ablative hypothyroidism:  Previously she had I-131 treatment on 08/20/11 for Graves' disease  Her TSH was finally normal in September Still compliant with her 100 g dose which was started in 8/18   Lab Results  Component Value Date   TSH 1.510 06/24/2017   TSH  12.63 (H) 05/13/2017   TSH 22.770 (H) 05/02/2017   FREET4 0.64 05/13/2017   FREET4 0.64 05/02/2017   FREET4 0.82 11/11/2016    On Gabapentin For leg pains    Examination:   BP 136/80 (BP Location: Left Arm, Patient Position: Sitting, Cuff Size: Large)   Pulse (!) 110   Ht 5\' 6"  (1.676 m)   Wt 297 lb (134.7 kg)   SpO2 98%   BMI 47.94 kg/m   Body mass index is 47.94 kg/m.     ASSESSMENT/ PLAN:   Diabetes type 2, uncontrolled with obesity:   See history of present illness for detailed discussion of current management, blood sugar patterns and problems identified  Although her A1c is 6.1 her average blood sugar at home is 185  She has mostly postprandial hyperglycemia and this is also not consistently being monitored especially after her first meal of the day She has gained 20 pounds from being relatively inactive, probably not watching her diet and also taking insulin alone without any sensitizers or GLP-1 drugs  Her treatment is limited by cost factors also   Recommendations:  Check renal function today and if still near normal consider restarting metformin  She wants to try [place] since she cannot afford Victoza and her husband is also taking this without excessive expense  However she does need to start checking blood sugars after her first meal of the day when she is getting significant amount of carbohydrates on a regular basis  For now she will take 15 units of NovoLog at suppertime and 10 units for her breakfast  She notes she does not really have a meal plan and has poor understanding of carbohydrates will have her see the dietitian  She will still need to continue NovoLog even when she starts [place] although may need about 5 units less  Discussed how [place] works and differences between this and Victoza and insulin  She may need adjustment of the dosage although most likely will need 30 units, she can start with 20 units for the first 3 days to make sure  she does not have any nausea  Discussed that the adjustment will be done based on fasting blood sugars  Hypothyroidism:  Needs follow-up TSH now  LIPIDS: Needs follow-up levels on the next visit  HYPERTENSION: Controlled   Renal dysfunction: Needs follow-up  Counseling time on subjects discussed in assessment and plan sections is over 50% of today's 25 minute visit    There are no Patient Instructions on file for this visit.   Addendum: TSH 10.8, will increase her dose to 125 mcg if she confirms she is taking this regularly Creatinine 1.7, will not start metformin LDL 114, improved, needs 40 mg Lipitor   Leonna Schlee 12/31/2017, 1:32 PM   Note: This office note was prepared with Estate agent. Any transcriptional errors that result from this process are unintentional.

## 2017-12-31 NOTE — Patient Instructions (Addendum)
Novolog 10 at [facility] and 15 at dinner before the meal  Check blood sugars on waking up 3/7   Also check blood sugars about 2 hours after a meal and do this after different meals by rotation  Recommended blood sugar levels on waking up is 90-130 and about 2 hours after meal is 130-160  Please bring your blood sugar monitor to each visit, thank you  Soliqua take 20 units for 3 days then 30 Units in am; with this take 5-10 Novolog

## 2018-01-01 ENCOUNTER — Telehealth: Payer: Self-pay | Admitting: Endocrinology

## 2018-01-01 ENCOUNTER — Other Ambulatory Visit: Payer: Self-pay

## 2018-01-01 DIAGNOSIS — R2689 Other abnormalities of gait and mobility: Secondary | ICD-10-CM | POA: Diagnosis not present

## 2018-01-01 DIAGNOSIS — M79604 Pain in right leg: Secondary | ICD-10-CM | POA: Diagnosis not present

## 2018-01-01 DIAGNOSIS — M545 Low back pain: Secondary | ICD-10-CM | POA: Diagnosis not present

## 2018-01-01 MED ORDER — INSULIN GLARGINE-LIXISENATIDE 100-33 UNT-MCG/ML ~~LOC~~ SOPN: 30.0000 [IU] | PEN_INJECTOR | Freq: Every day | SUBCUTANEOUS | 3 refills | Status: DC

## 2018-01-01 NOTE — Telephone Encounter (Signed)
Patient need a new script, 30 day supply of medication Insulin Glargine-Lixisenatide ([place]) 100-33 UNT-MCG/ML SOPN [[id number]],   [id number] - [place], [place] DEA #:  [id number]

## 2018-01-01 NOTE — Telephone Encounter (Signed)
This has been done.

## 2018-01-01 NOTE — Telephone Encounter (Signed)
wPatient husband called he need his wife medication  ([place]) called in to pharmacy for 5 pens for 30 day, he stated it was a lot cheaper only  $47 dollars. Please advise  Pharmacy:  [facility] [place], [place] DEA #:  [id number]

## 2018-01-02 ENCOUNTER — Telehealth: Payer: Self-pay | Admitting: Endocrinology

## 2018-01-02 NOTE — Telephone Encounter (Signed)
Error

## 2018-01-05 ENCOUNTER — Other Ambulatory Visit: Payer: Self-pay

## 2018-01-05 MED ORDER — INSULIN GLARGINE-LIXISENATIDE 100-33 UNT-MCG/ML ~~LOC~~ SOPN: 30.0000 [IU] | PEN_INJECTOR | Freq: Every day | SUBCUTANEOUS | 3 refills | Status: DC

## 2018-01-05 NOTE — Telephone Encounter (Signed)
This has been done.

## 2018-01-06 DIAGNOSIS — M545 Low back pain: Secondary | ICD-10-CM | POA: Diagnosis not present

## 2018-01-07 ENCOUNTER — Other Ambulatory Visit: Payer: Self-pay

## 2018-01-07 MED ORDER — GLUCOSE BLOOD VI STRP: ORAL_STRIP | 2 refills | Status: DC

## 2018-01-08 DIAGNOSIS — M545 Low back pain: Secondary | ICD-10-CM | POA: Diagnosis not present

## 2018-01-08 NOTE — Progress Notes (Signed)
Subjective:   Patient ID: [name], female   DOB: 67 y.o.   MRN: [id number]   HPI Patient presents with nail disease 1-5 both feet that are thick and incurvated and painful to press and also presents with inflammation of the left tendon that has still been bothering her quite a bit and she is concerned about   ROS      Objective:  Physical Exam  Neurovascular status intact muscle strength adequate patient found to have tendinitis-like symptoms inflammation and thickened yellow brittle nailbeds 1-5 both feet that are painful in the corners and make shoe gear difficult     Assessment:  Mycotic nail infection with pain 1-5 both feet along with inflammation of the lateral tendon left     Plan:  H&P condition reviewed and at this point I went ahead and discussed ice therapy supportive therapy and Ace wrap for the tendon and I did debride the nailbeds 1-5 both feet with no iatrogenic bleeding noted.

## 2018-01-12 ENCOUNTER — Other Ambulatory Visit: Payer: Self-pay | Admitting: *Deleted

## 2018-01-12 MED ORDER — INSULIN GLARGINE-LIXISENATIDE 100-33 UNT-MCG/ML ~~LOC~~ SOPN: 34.0000 [IU] | PEN_INJECTOR | Freq: Every day | SUBCUTANEOUS | 3 refills | Status: DC

## 2018-01-12 MED ORDER — LEVOTHYROXINE SODIUM 125 MCG PO TABS: 125.0000 ug | ORAL_TABLET | Freq: Every day | ORAL | 3 refills | Status: DC

## 2018-01-12 NOTE — Telephone Encounter (Signed)
Pt informed of below. New Rx for levothyroxine 125 mcg sent. Pt also states the sig was incorrect on SOLIQUA. Pt states the sig should be 34 units daily.  Notes recorded by Elayne Snare, MD on 01/01/2018 at 9:08 AM EDT Kidney test is not as good, will not start metformin. Thyroid level is low, need to confirm if she is taking her levothyroxine consistently before breakfast and without iron at the same time. If so she will need a new prescription for 125 mcg levothyroxine. Also cholesterol is high and she will need to increase her Lipitor to 40 mg instead of 20 mg

## 2018-01-13 DIAGNOSIS — M545 Low back pain: Secondary | ICD-10-CM | POA: Diagnosis not present

## 2018-01-15 ENCOUNTER — Telehealth: Payer: Self-pay | Admitting: *Deleted

## 2018-01-15 NOTE — Telephone Encounter (Signed)
Pt called and is upset about the way her [name] Rx is written and the cost.   I called the pharmacy and they state her Rx needs to be EITHER a quantity of 3 pens OR change the sig to inject 50 units daily  to make this a 30 day supply and cheaper for the pt.   Please advise.

## 2018-01-15 NOTE — Telephone Encounter (Signed)
Write 3 pens

## 2018-01-16 ENCOUNTER — Other Ambulatory Visit: Payer: Self-pay | Admitting: Family

## 2018-01-16 MED ORDER — INSULIN GLARGINE-LIXISENATIDE 100-33 UNT-MCG/ML ~~LOC~~ SOPN: 34.0000 [IU] | PEN_INJECTOR | Freq: Every day | SUBCUTANEOUS | 3 refills | Status: DC

## 2018-01-16 NOTE — Telephone Encounter (Signed)
New Rx printed, signed ad faxed to pharmacy.

## 2018-01-20 ENCOUNTER — Ambulatory Visit (INDEPENDENT_AMBULATORY_CARE_PROVIDER_SITE_OTHER): Payer: Medicare Other | Admitting: Internal Medicine

## 2018-01-20 ENCOUNTER — Encounter: Payer: Self-pay | Admitting: Internal Medicine

## 2018-01-20 VITALS — BP 136/88 | HR 88 | Temp 98.4°F | Ht 66.0 in | Wt 296.0 lb

## 2018-01-20 DIAGNOSIS — I10 Essential (primary) hypertension: Secondary | ICD-10-CM

## 2018-01-20 DIAGNOSIS — N184 Chronic kidney disease, stage 4 (severe): Secondary | ICD-10-CM | POA: Diagnosis not present

## 2018-01-20 DIAGNOSIS — E669 Obesity, unspecified: Secondary | ICD-10-CM | POA: Diagnosis not present

## 2018-01-20 DIAGNOSIS — M7712 Lateral epicondylitis, left elbow: Secondary | ICD-10-CM

## 2018-01-20 DIAGNOSIS — M545 Low back pain: Secondary | ICD-10-CM | POA: Diagnosis not present

## 2018-01-20 DIAGNOSIS — M7989 Other specified soft tissue disorders: Secondary | ICD-10-CM | POA: Diagnosis not present

## 2018-01-20 MED ORDER — NALTREXONE HCL 50 MG PO TABS: 50.0000 mg | ORAL_TABLET | Freq: Every day | ORAL | 3 refills | Status: DC

## 2018-01-20 MED ORDER — BUPROPION HCL ER (XL) 150 MG PO TB24: 150.0000 mg | ORAL_TABLET | Freq: Every day | ORAL | 3 refills | Status: DC

## 2018-01-20 NOTE — Patient Instructions (Signed)
Please take all new medication as prescribed - the "contrave' kind of medications for wt loss  Please continue all other medications as before, and refills have been done if requested.  Please have the pharmacy call with any other refills you may need.  Please continue your efforts at being more active, low cholesterol diet, and weight control.  Please keep your appointments with your specialists as you may have planned  You will be contacted regarding the referral for: Dr Tamala Julian - Sports medicine

## 2018-01-20 NOTE — Progress Notes (Signed)
Subjective:    Patient ID: [name], female    DOB: 15-Jan-1951, 67 y.o.   MRN: [id number]  HPI   Here to f/u with husband present, Pt denies chest pain, increased sob or doe, wheezing, orthopnea, PND, palpitations, dizziness or syncope, but c/o worsening right leg swelling mostly right upper leg with mild discomfort but no skin changes, fever, trauma or falls.  Has hx of lymphedema, but also CKD and has appt with dr sanford/renal may 1.  Wondering if she now has other volume overload on top of chronic lymphedema.  Also has pain to the left lateral elbow x 2 wks, sharp, tender to touch with some non discrete swelling of the area but without skin changes, denies overuse but is worse to grasp objects to pick up with the left hand and arm.  Also wants very much to work on wt loss as this may be contributing to worsening lymphedema.   No other interval hx or change Past Medical History:  Diagnosis Date  . Anxiety   . Arthritis   . Blood dyscrasia    "FREE BLEEDER"  . CERVICAL RADICULOPATHY, LEFT   . Chronic back pain   . CKD (chronic kidney disease)    DR. SANFORD  [facility]  . COMMON MIGRAINE   . CORONARY ARTERY DISEASE   . Cough    CURRENT COLD  . Cystitis   . DEPRESSION   . Diabetes mellitus, type II ([place])   . DIVERTICULOSIS-COLON   . Gastric ulcer 04/2008  . Gastroparesis   . GERD (gastroesophageal reflux disease)   . Hiatal hernia   . Hyperlipidemia   . Hypertension   . Hypothyroidism   . INSOMNIA-SLEEP DISORDER-UNSPEC   . Iron deficiency anemia   . Wears glasses    Past Surgical History:  Procedure Laterality Date  . ABDOMINAL HYSTERECTOMY    . ANTERIOR LAT LUMBAR FUSION Left 08/13/2017   Procedure: LEFT SIDED LUMBAR 3-4 LATERAL INTERBODY FUSION WITH INSTRUMENTATION AND ALLOGRAFT;  Surgeon: Phylliss Bob, MD;  Location: [facility];  Service: Orthopedics;  Laterality: Left;  LEFT SIDED LUMBAR 3-4 LATERAL INTERBODY FUSION WITH INSTRUMENTATION AND ALLOGRAFT; REQUEST 3  HOURS  . BACK SURGERY  03/2016  . CHOLECYSTECTOMY  06/2009  . COLONOSCOPY    . ESOPHAGOGASTRODUODENOSCOPY    . EYE SURGERY Bilateral    lasik  . Gastric Wedge resection lipoma  11/2007   x2 with laparotomy and gastrostomy  . Left knee replacement    . Rigth knee replacement with revision  04/2008   Dr. Berenice Primas  . ROTATOR CUFF REPAIR Left 01/2009  . s/p bladder surgury  09/2009   Dr. Terance Hart    reports that she has quit smoking. She has never used smokeless tobacco. She reports that she does not drink alcohol or use drugs. family history includes Coronary artery disease in her other; Diabetes in her brother and sister; Heart disease in her brother and sister; Hypertension in her other. Allergies  Allergen Reactions  . Hydrocodone Other (See Comments)    Tachycardia   . Ciprofloxacin Other (See Comments)    Dizziness   . Penicillins Hives    Has patient had a PCN reaction causing immediate rash, facial/tongue/throat swelling, SOB or lightheadedness with hypotension: No Has patient had a PCN reaction causing severe rash involving mucus membranes or skin necrosis: No Has patient had a PCN reaction that required hospitalization No Has patient had a PCN reaction occurring within the last 10 years: No If all  of the above answers are "NO", then may proceed with Cephalosporin use.    Current Outpatient Medications on File Prior to Visit  Medication Sig Dispense Refill  . acetaminophen-codeine (TYLENOL #3) 300-30 MG tablet     . aspirin 81 MG EC tablet Take 81 mg by mouth every evening.     Marland Kitchen atorvastatin (LIPITOR) 40 MG tablet Take 40 mg by mouth daily.    Marland Kitchen ELMIRON 100 MG capsule Take 100 mg by mouth 2 (two) times daily.     Marland Kitchen estradiol (CLIMARA - DOSED IN MG/24 HR) 0.1 mg/24hr patch Place 1 patch (0.1 mg total) onto the skin once a week. 12 patch 4  . FLUoxetine (PROZAC) 20 MG capsule TAKE 20mg  BY MOUTH DAILY 90 capsule 3  . glucose blood (ACCU-CHEK AVIVA PLUS) test strip USE TO TEST  BLOOD SUGAR 4 TO 6 TIMES DAILY DX CODE E11.65 450 each 2  . HYDROcodone-acetaminophen (NORCO) 7.5-325 MG tablet     . insulin aspart (NOVOLOG FLEXPEN) 100 UNIT/ML FlexPen Inject 10 Units into the skin 3 (three) times daily with meals.    . Insulin Glargine (TOUJEO SOLOSTAR) 300 UNIT/ML SOPN Inject 30 Units into the skin daily. 3 pen 3  . Insulin Glargine-Lixisenatide (SOLIQUA) 100-33 UNT-MCG/ML SOPN Inject 34 Units into the skin daily with breakfast. 3 pen 3  . levETIRAcetam (KEPPRA) 250 MG tablet Take 250-500 mg by mouth 2 (two) times daily. Dose depends on level of pain.    Marland Kitchen levothyroxine (SYNTHROID, LEVOTHROID) 125 MCG tablet Take 1 tablet (125 mcg total) by mouth daily. 90 tablet 3  . losartan (COZAAR) 100 MG tablet Take 1 tablet (100 mg total) by mouth daily. 90 tablet 3  . ondansetron (ZOFRAN) 4 MG tablet TAKE 1 TABLET BY MOUTH EVERY 6 HOURS AS NEEDED FOR NAUSEA 30 tablet 0  . progesterone (PROMETRIUM) 100 MG capsule Take 1 capsule (100 mg total) by mouth at bedtime. 90 capsule 4  . torsemide (DEMADEX) 20 MG tablet Take 20 mg by mouth daily.    Marland Kitchen trimethoprim (TRIMPEX) 100 MG tablet Take 100 mg by mouth every evening.   1   No current facility-administered medications on file prior to visit.    Review of Systems  Constitutional: Negative for other unusual diaphoresis or sweats HENT: Negative for ear discharge or swelling Eyes: Negative for other worsening visual disturbances Respiratory: Negative for stridor or other swelling  Gastrointestinal: Negative for worsening distension or other blood Genitourinary: Negative for retention or other urinary change Musculoskeletal: Negative for other MSK pain or swelling Skin: Negative for color change or other new lesions Neurological: Negative for worsening tremors and other numbness  Psychiatric/Behavioral: Negative for worsening agitation or other fatigue All other system neg per pt    Objective:   Physical Exam BP 136/88   Pulse 88    Temp 98.4 F (36.9 C) (Oral)   Ht 5\' 6"  (1.676 m)   Wt 296 lb (134.3 kg)   SpO2 98%   BMI 47.78 kg/m  VS noted,  Constitutional: Pt appears in NAD HENT: Head: NCAT.  Right Ear: External ear normal.  Left Ear: External ear normal.  Eyes: . Pupils are equal, round, and reactive to light. Conjunctivae and EOM are normal Nose: without d/c or deformity Neck: Neck supple. Gross normal ROM Cardiovascular: Normal rate and regular rhythm.   Pulmonary/Chest: Effort normal and breath sounds without rales or wheezing.  Abd:  Soft, NT, ND, + BS, no organomegaly + tender to left elbow  lateral epicondyle Neurological: Pt is alert. At baseline orientation, motor grossly intact Skin: Skin is warm. No rashes, other new lesions, has massive RLE > LLE lymphedema but not clear to me is worse.   Psychiatric: Pt behavior is normal without agitation  No other exam findings     Assessment & Plan:

## 2018-01-22 DIAGNOSIS — N3020 Other chronic cystitis without hematuria: Secondary | ICD-10-CM | POA: Diagnosis not present

## 2018-01-22 DIAGNOSIS — R109 Unspecified abdominal pain: Secondary | ICD-10-CM | POA: Diagnosis not present

## 2018-01-22 DIAGNOSIS — N3010 Interstitial cystitis (chronic) without hematuria: Secondary | ICD-10-CM | POA: Diagnosis not present

## 2018-01-23 DIAGNOSIS — M7712 Lateral epicondylitis, left elbow: Secondary | ICD-10-CM | POA: Insufficient documentation

## 2018-01-23 DIAGNOSIS — E669 Obesity, unspecified: Secondary | ICD-10-CM | POA: Insufficient documentation

## 2018-01-23 NOTE — Assessment & Plan Note (Signed)
[place] for [employer] like med - naltrexone and wellbutrin asd,  to f/u any worsening symptoms or concerns

## 2018-01-23 NOTE — Assessment & Plan Note (Signed)
BP Readings from Last 3 Encounters:  01/20/18 136/88  12/31/17 136/80  10/22/17 134/82  stable overall by history and exam, recent data reviewed with pt, and pt to continue medical treatment as before,  to f/u any worsening symptoms or concerns

## 2018-01-23 NOTE — Assessment & Plan Note (Signed)
Not clear worsening to me, d/w pt that further diuretic unlikely to help , for leg elevation, low salt diet, wt loss

## 2018-01-23 NOTE — Assessment & Plan Note (Signed)
Cont planned f/u with renal, o/w stable by exam

## 2018-01-23 NOTE — Assessment & Plan Note (Signed)
For tylenol prn, avoid trauma or overuse, refer sports med, avoid nsaids due to CKD

## 2018-01-27 DIAGNOSIS — M545 Low back pain: Secondary | ICD-10-CM | POA: Diagnosis not present

## 2018-01-27 NOTE — Progress Notes (Signed)
[name] [address] [place], [place] [address] Phone: [phone] Subjective:    I'm seeing this patient by the request  of:  Biagio Borg, MD  CC: Left arm pain  SNK:[id number]  MARSHEA [name] is a 67 y.o. female coming in with complaint of left arm pain.  Patient has been seen before and does have Pala arthritis.  Patient has had difficulty with this as well as a de Quervain's.  Patient states that the pain is different.  Has been significant amount time since we have seen her.  States now noticing more weakness.  Affecting daily activities.    Past Medical History:  Diagnosis Date  . Anxiety   . Arthritis   . Blood dyscrasia    "FREE BLEEDER"  . CERVICAL RADICULOPATHY, LEFT   . Chronic back pain   . CKD (chronic kidney disease)    DR. SANFORD  [facility]  . COMMON MIGRAINE   . CORONARY ARTERY DISEASE   . Cough    CURRENT COLD  . Cystitis   . DEPRESSION   . Diabetes mellitus, type II ([place])   . DIVERTICULOSIS-COLON   . Gastric ulcer 04/2008  . Gastroparesis   . GERD (gastroesophageal reflux disease)   . Hiatal hernia   . Hyperlipidemia   . Hypertension   . Hypothyroidism   . INSOMNIA-SLEEP DISORDER-UNSPEC   . Iron deficiency anemia   . Wears glasses    Past Surgical History:  Procedure Laterality Date  . ABDOMINAL HYSTERECTOMY    . ANTERIOR LAT LUMBAR FUSION Left 08/13/2017   Procedure: LEFT SIDED LUMBAR 3-4 LATERAL INTERBODY FUSION WITH INSTRUMENTATION AND ALLOGRAFT;  Surgeon: Phylliss Bob, MD;  Location: [facility];  Service: Orthopedics;  Laterality: Left;  LEFT SIDED LUMBAR 3-4 LATERAL INTERBODY FUSION WITH INSTRUMENTATION AND ALLOGRAFT; REQUEST 3 HOURS  . BACK SURGERY  03/2016  . CHOLECYSTECTOMY  06/2009  . COLONOSCOPY    . ESOPHAGOGASTRODUODENOSCOPY    . EYE SURGERY Bilateral    lasik  . Gastric Wedge resection lipoma  11/2007   x2 with laparotomy and gastrostomy  . Left knee replacement    . Rigth knee replacement with  revision  04/2008   Dr. Berenice Primas  . ROTATOR CUFF REPAIR Left 01/2009  . s/p bladder surgury  09/2009   Dr. Terance Hart   Social History   Socioeconomic History  . Marital status: Married    Spouse name: Not on file  . Number of children: Not on file  . Years of education: Not on file  . Highest education level: Not on file  Occupational History  . Occupation: disabled  Social Needs  . Financial resource strain: Not on file  . Food insecurity:    Worry: Not on file    Inability: Not on file  . Transportation needs:    Medical: Not on file    Non-medical: Not on file  Tobacco Use  . Smoking status: Former Research scientist (life sciences)  . Smokeless tobacco: Never Used  . Tobacco comment: quit 30 years ago  Substance and Sexual Activity  . Alcohol use: No    Alcohol/week: 0.0 oz  . Drug use: No  . Sexual activity: Not Currently    Birth control/protection: None  Lifestyle  . Physical activity:    Days per week: Not on file    Minutes per session: Not on file  . Stress: Not on file  Relationships  . Social connections:    Talks on phone: Not  on file    Gets together: Not on file    Attends religious service: Not on file    Active member of club or organization: Not on file    Attends meetings of clubs or organizations: Not on file    Relationship status: Not on file  Other Topics Concern  . Not on file  Social History Narrative   Patient gets regular exercise   Allergies  Allergen Reactions  . Hydrocodone Other (See Comments)    Tachycardia   . Ciprofloxacin Other (See Comments)    Dizziness   . Penicillins Hives    Has patient had a PCN reaction causing immediate rash, facial/tongue/throat swelling, SOB or lightheadedness with hypotension: No Has patient had a PCN reaction causing severe rash involving mucus membranes or skin necrosis: No Has patient had a PCN reaction that required hospitalization No Has patient had a PCN reaction occurring within the last 10 years: No If all of the  above answers are "NO", then may proceed with Cephalosporin use.    Family History  Problem Relation Age of Onset  . Diabetes Sister        x 3  . Heart disease Sister        x2  . Diabetes Brother        x3  . Heart disease Brother        x2  . Coronary artery disease Other        female 1st degree  . Hypertension Other   . Colon cancer Neg Hx      Past medical history, social, surgical and family history all reviewed in electronic medical record.  No pertanent information unless stated regarding to the chief complaint.   Review of Systems:Review of systems updated and as accurate as of 01/28/18  No headache, visual changes, nausea, vomiting, diarrhea, constipation, dizziness, abdominal pain, skin rash, fevers, chills, night sweats, weight loss, swollen lymph nodes, body aches, joint swelling, chest pain, shortness of breath, mood changes.  Positive muscle aches  Objective  Blood pressure (!) 150/90, pulse 84, height 5\' 6"  (1.676 m), weight 292 lb (132.5 kg), SpO2 97 %. Systems examined below as of 01/28/18   General: No apparent distress alert and oriented x3 mood and affect normal, dressed appropriately.  HEENT: Pupils equal, extraocular movements intact  Respiratory: Patient's speak in full sentences and does not appear short of breath  Cardiovascular: 2+lower extremity edema, tender, no erythema  Skin: Warm dry intact with no signs of infection or rash on extremities or on axial skeleton.  Abdomen: Soft nontender  Neuro: Cranial nerves II through XII are intact, neurovascularly intact in all extremities with 2+ DTRs and 2+ pulses.  Lymph: No lymphadenopathy of posterior or anterior cervical chain or axillae bilaterally.  Gait antalgic walking with the aid of a cane MSK: Diffuse tender with mild limited range of motion and good stability and symmetric strength and tone of shoulders, elbows, , hip, knee and ankles bilaterally.  Left hip exam shows the patient does have a  positive Spurling's maneuver.  He does have some thenar eminence wasting at this time.  Neck exam shows some decrease in range of motion and one secondary positive Spurling's.  Patient has weakness in the C6 distribution.  Grip strength little weaker than the contralateral side.    Impression and Recommendations:     This case required medical decision making of moderate complexity.      Note: This dictation was prepared with Dragon dictation  along with smaller phrase technology. Any transcriptional errors that result from this process are unintentional.

## 2018-01-28 ENCOUNTER — Other Ambulatory Visit: Payer: Self-pay | Admitting: Family Medicine

## 2018-01-28 ENCOUNTER — Ambulatory Visit (INDEPENDENT_AMBULATORY_CARE_PROVIDER_SITE_OTHER): Payer: Medicare Other | Admitting: Family Medicine

## 2018-01-28 ENCOUNTER — Encounter

## 2018-01-28 ENCOUNTER — Encounter: Payer: Self-pay | Admitting: Family Medicine

## 2018-01-28 ENCOUNTER — Ambulatory Visit (INDEPENDENT_AMBULATORY_CARE_PROVIDER_SITE_OTHER)
Admission: RE | Admit: 2018-01-28 | Discharge: 2018-01-28 | Disposition: A | Discharge status: Home / Self Care | Payer: Medicare Other | Source: Ambulatory Visit | Attending: Family Medicine | Admitting: Family Medicine

## 2018-01-28 ENCOUNTER — Ambulatory Visit: Payer: Self-pay

## 2018-01-28 VITALS — BP 150/90 | HR 84 | Ht 66.0 in | Wt 292.0 lb

## 2018-01-28 DIAGNOSIS — M542 Cervicalgia: Secondary | ICD-10-CM

## 2018-01-28 DIAGNOSIS — M5412 Radiculopathy, cervical region: Secondary | ICD-10-CM | POA: Diagnosis not present

## 2018-01-28 DIAGNOSIS — M50223 Other cervical disc displacement at C6-C7 level: Secondary | ICD-10-CM | POA: Diagnosis not present

## 2018-01-28 DIAGNOSIS — M25531 Pain in right wrist: Secondary | ICD-10-CM

## 2018-01-28 DIAGNOSIS — M50222 Other cervical disc displacement at C5-C6 level: Secondary | ICD-10-CM | POA: Diagnosis not present

## 2018-01-28 MED ORDER — GABAPENTIN 100 MG PO CAPS: 200.0000 mg | ORAL_CAPSULE | Freq: Every day | ORAL | 3 refills | Status: DC

## 2018-01-28 NOTE — Patient Instructions (Addendum)
Good ot see yo u Sorry you are hurting.  Gabapentin 200mg  at night Duexis 3 times a day for 3 days.  pennsaid pinkie amount topically 2 times daily as needed.   Exercises 3 times a week.  Xray of neck downstairs See me again in 3 weeks to make sure better or we will get MRI

## 2018-01-28 NOTE — Assessment & Plan Note (Signed)
Worsening pain with weakness.  Patient started on gabapentin.  Will not increase dose secondary to patient's chronic kidney disease.  Patient unable to do prednisone secondary to her diabetes.  90 points of weakness is secondary to nerve problems.  May need to consider nerve conduction study.  Patient is in agreement with the plan.  Follow-up with me again in 2 to 3 weeks.  Home exercises given.  Worsening pain seek medical attention at an emergency facility.

## 2018-01-29 ENCOUNTER — Other Ambulatory Visit: Payer: Medicare Other

## 2018-02-02 ENCOUNTER — Other Ambulatory Visit (INDEPENDENT_AMBULATORY_CARE_PROVIDER_SITE_OTHER): Payer: Medicare Other

## 2018-02-02 DIAGNOSIS — E1165 Type 2 diabetes mellitus with hyperglycemia: Secondary | ICD-10-CM | POA: Diagnosis not present

## 2018-02-02 DIAGNOSIS — N189 Chronic kidney disease, unspecified: Secondary | ICD-10-CM | POA: Diagnosis not present

## 2018-02-02 DIAGNOSIS — N183 Chronic kidney disease, stage 3 (moderate): Secondary | ICD-10-CM | POA: Diagnosis not present

## 2018-02-02 DIAGNOSIS — E890 Postprocedural hypothyroidism: Secondary | ICD-10-CM

## 2018-02-02 DIAGNOSIS — Z794 Long term (current) use of insulin: Secondary | ICD-10-CM

## 2018-02-02 LAB — BASIC METABOLIC PANEL
BUN: 26 mg/dL — ABNORMAL HIGH (ref 6–23)
CO2: 28 mEq/L (ref 19–32)
Calcium: 9.5 mg/dL (ref 8.4–10.5)
Chloride: 106 mEq/L (ref 96–112)
Creatinine, Ser: 1.43 mg/dL — ABNORMAL HIGH (ref 0.40–1.20)
GFR: 46.94 mL/min — ABNORMAL LOW (ref >60.00)
Glucose, Bld: 118 mg/dL — ABNORMAL HIGH (ref 70–99)
Potassium: 4.4 mEq/L (ref 3.5–5.1)
Sodium: 139 mEq/L (ref 135–145)

## 2018-02-02 LAB — TSH: TSH: 0.26 u[IU]/mL — ABNORMAL LOW (ref 0.35–4.50)

## 2018-02-03 ENCOUNTER — Ambulatory Visit (INDEPENDENT_AMBULATORY_CARE_PROVIDER_SITE_OTHER): Payer: Medicare Other | Admitting: Endocrinology

## 2018-02-03 ENCOUNTER — Encounter: Payer: Self-pay | Admitting: Endocrinology

## 2018-02-03 VITALS — BP 130/82 | HR 113 | Ht 66.0 in | Wt 280.0 lb

## 2018-02-03 DIAGNOSIS — E890 Postprocedural hypothyroidism: Secondary | ICD-10-CM | POA: Diagnosis not present

## 2018-02-03 DIAGNOSIS — E1165 Type 2 diabetes mellitus with hyperglycemia: Secondary | ICD-10-CM

## 2018-02-03 DIAGNOSIS — Z794 Long term (current) use of insulin: Secondary | ICD-10-CM

## 2018-02-03 LAB — FRUCTOSAMINE: Fructosamine: 296 umol/L — ABNORMAL HIGH (ref 0–285)

## 2018-02-03 NOTE — Patient Instructions (Addendum)
Novolog 8-12 units based on carbs  Take 1/2 Synthroid on Sundays

## 2018-02-03 NOTE — Progress Notes (Signed)
[name] is a 67 y.o. female.    Reason for Appointment: Diabetes follow-up   History of Present Illness   Diagnosis: Type 2 DIABETES MELITUS, date of diagnosis: 2000  Previous history: She had initially been treated with metformin and Amaryl and subsequently given Victoza which helped overall control and weight loss. In 9/13 because of significant hyperglycemia she was started on basal bolus insulin also. Had required relatively small doses of insulin. Her blood sugar control has been excellent with A1c between 5.3-6.0 although this may be relatively higher than expected. Tends to have relatively higher readings after supper In 6/15 she was told to resume her Victoza and stop her Lantus since she had been gaining weight with Lantus and NovoLog regimen.   Recent history:     INSULIN: Novolog 10 units before meals; Soliqua 34 units once daily   Non-insulin hypoglycemic drugs: None   Her A1c in 3/19 is much lower than expected at 6.1, previously 7.9  Fructosamine is 296, previously higher  Current blood sugar patterns and management:  She has been able to start Pierz since about a month ago when she was not having good control especially after meals with Toujeo and NovoLog only  She has been able to afford the [place] and previously could not afford Victoza  She does think that she is having some more satiety with using Soliqua and appears to have lost weight since her last visit  However she has been using her husband's monitor until about a week ago and not clear which readings have been heard previously  She did have an episode of her blood sugar of 68 without significant symptoms at bedtime once but no other hypoglycemia  She does not know how to adjust her mealtime dose based on what she is eating and after a sandwich last evening blood sugar was 192 has also high after Sunday lunch  FASTING readings are however excellent  Checking some readings in the  afternoons which are also relatively good  Has been minimally active because of her continued back pain     Side effects from medications:  none  Monitors blood glucose: Once a day or less on average .    Glucometer:  Accu-Chek .          Blood Glucose readings as follows  Recent FASTING readings range from 118-122 and nonfasting 91-193    Meals: 1-2 meals per day. Lunch Supper at 7 PM pm           Physical activity: exercise: Limited, has had fatigue, back and leg pain.            Dietician visit: Most recent: 03/1949            Complications: are: None  Wt Readings from Last 3 Encounters:  02/03/18 280 lb (127 kg)  01/28/18 292 lb (132.5 kg)  01/20/18 296 lb (134.3 kg)    LABS:  Lab Results  Component Value Date   HGBA1C 6.1 12/31/2017   HGBA1C 7.9 (H) 08/07/2017   HGBA1C 8.2 (H) 05/13/2017   Lab Results  Component Value Date   MICROALBUR 2.0 (H) 12/31/2017   LDLCALC 114 (H) 12/31/2017   CREATININE 1.43 (H) 02/02/2018    Other active problems: See review of systems   Lab on 02/02/2018  Component Date Value Ref Range Status  . TSH 02/02/2018 0.26* 0.35 - 4.50 uIU/mL Final  . Fructosamine 02/02/2018 296* 0 - 285 umol/L Final   Comment:  Published reference interval for apparently healthy subjects between age 35 and 72 is 55 - 285 umol/L and in a poorly controlled diabetic population is 228 - 563 umol/L with a mean of 396 umol/L.   Marland Kitchen Sodium 02/02/2018 139  135 - 145 mEq/L Final  . Potassium 02/02/2018 4.4  3.5 - 5.1 mEq/L Final  . Chloride 02/02/2018 106  96 - 112 mEq/L Final  . CO2 02/02/2018 28  19 - 32 mEq/L Final  . Glucose, Bld 02/02/2018 118* 70 - 99 mg/dL Final  . BUN 02/02/2018 26* 6 - 23 mg/dL Final  . Creatinine, Ser 02/02/2018 1.43* 0.40 - 1.20 mg/dL Final  . Calcium 02/02/2018 9.5  8.4 - 10.5 mg/dL Final  . GFR 02/02/2018 46.94* >60.00 mL/min Final    Allergies as of 02/03/2018      Reactions   Hydrocodone Other (See Comments)   Tachycardia    Ciprofloxacin Other (See Comments)   Dizziness   Penicillins Hives   Has patient had a PCN reaction causing immediate rash, facial/tongue/throat swelling, SOB or lightheadedness with hypotension: No Has patient had a PCN reaction causing severe rash involving mucus membranes or skin necrosis: No Has patient had a PCN reaction that required hospitalization No Has patient had a PCN reaction occurring within the last 10 years: No If all of the above answers are "NO", then may proceed with Cephalosporin use.      Medication List        Accurate as of 02/03/18  9:46 PM. Always use your most recent med list.          acetaminophen-codeine 300-30 MG tablet Commonly known as:  TYLENOL #3   aspirin 81 MG EC tablet Take 81 mg by mouth every evening.   atorvastatin 40 MG tablet Commonly known as:  LIPITOR Take 40 mg by mouth daily.   buPROPion 150 MG 24 hr tablet Commonly known as:  WELLBUTRIN XL Take 1 tablet (150 mg total) by mouth daily.   ELMIRON 100 MG capsule Generic drug:  pentosan polysulfate Take 100 mg by mouth 2 (two) times daily.   estradiol 0.1 mg/24hr patch Commonly known as:  CLIMARA - Dosed in mg/24 hr Place 1 patch (0.1 mg total) onto the skin once a week.   FLUoxetine 20 MG capsule Commonly known as:  PROZAC TAKE 20mg  BY MOUTH DAILY   gabapentin 100 MG capsule Commonly known as:  NEURONTIN Take 2 capsules (200 mg total) by mouth at bedtime.   glucose blood test strip Commonly known as:  ACCU-CHEK AVIVA PLUS USE TO TEST BLOOD SUGAR 4 TO 6 TIMES DAILY DX CODE E11.65   HYDROcodone-acetaminophen 7.5-325 MG tablet Commonly known as:  NORCO   Insulin Glargine-Lixisenatide 100-33 UNT-MCG/ML Sopn Commonly known as:  SOLIQUA Inject 34 Units into the skin daily with breakfast.   levETIRAcetam 250 MG tablet Commonly known as:  KEPPRA Take 250-500 mg by mouth 2 (two) times daily. Dose depends on level of pain.   levothyroxine 125 MCG tablet Commonly known  as:  SYNTHROID, LEVOTHROID Take 1 tablet (125 mcg total) by mouth daily.   losartan 100 MG tablet Commonly known as:  COZAAR Take 1 tablet (100 mg total) by mouth daily.   naltrexone 50 MG tablet Commonly known as:  DEPADE Take 1 tablet (50 mg total) by mouth daily.   NOVOLOG FLEXPEN 100 UNIT/ML FlexPen Generic drug:  insulin aspart Inject 10 Units into the skin 3 (three) times daily with meals.   ondansetron 4 MG  tablet Commonly known as:  ZOFRAN TAKE 1 TABLET BY MOUTH EVERY 6 HOURS AS NEEDED FOR NAUSEA   progesterone 100 MG capsule Commonly known as:  PROMETRIUM Take 1 capsule (100 mg total) by mouth at bedtime.   torsemide 20 MG tablet Commonly known as:  DEMADEX Take 20 mg by mouth daily.   trimethoprim 100 MG tablet Commonly known as:  TRIMPEX Take 100 mg by mouth every evening.       Allergies:  Allergies  Allergen Reactions  . Hydrocodone Other (See Comments)    Tachycardia   . Ciprofloxacin Other (See Comments)    Dizziness   . Penicillins Hives    Has patient had a PCN reaction causing immediate rash, facial/tongue/throat swelling, SOB or lightheadedness with hypotension: No Has patient had a PCN reaction causing severe rash involving mucus membranes or skin necrosis: No Has patient had a PCN reaction that required hospitalization No Has patient had a PCN reaction occurring within the last 10 years: No If all of the above answers are "NO", then may proceed with Cephalosporin use.     Past Medical History:  Diagnosis Date  . Anxiety   . Arthritis   . Blood dyscrasia    "FREE BLEEDER"  . CERVICAL RADICULOPATHY, LEFT   . Chronic back pain   . CKD (chronic kidney disease)    DR. SANFORD  [facility]  . COMMON MIGRAINE   . CORONARY ARTERY DISEASE   . Cough    CURRENT COLD  . Cystitis   . DEPRESSION   . Diabetes mellitus, type II ([place])   . DIVERTICULOSIS-COLON   . Gastric ulcer 04/2008  . Gastroparesis   . GERD (gastroesophageal reflux  disease)   . Hiatal hernia   . Hyperlipidemia   . Hypertension   . Hypothyroidism   . INSOMNIA-SLEEP DISORDER-UNSPEC   . Iron deficiency anemia   . Wears glasses     Past Surgical History:  Procedure Laterality Date  . ABDOMINAL HYSTERECTOMY    . ANTERIOR LAT LUMBAR FUSION Left 08/13/2017   Procedure: LEFT SIDED LUMBAR 3-4 LATERAL INTERBODY FUSION WITH INSTRUMENTATION AND ALLOGRAFT;  Surgeon: Phylliss Bob, MD;  Location: [facility];  Service: Orthopedics;  Laterality: Left;  LEFT SIDED LUMBAR 3-4 LATERAL INTERBODY FUSION WITH INSTRUMENTATION AND ALLOGRAFT; REQUEST 3 HOURS  . BACK SURGERY  03/2016  . CHOLECYSTECTOMY  06/2009  . COLONOSCOPY    . ESOPHAGOGASTRODUODENOSCOPY    . EYE SURGERY Bilateral    lasik  . Gastric Wedge resection lipoma  11/2007   x2 with laparotomy and gastrostomy  . Left knee replacement    . Rigth knee replacement with revision  04/2008   Dr. Berenice Primas  . ROTATOR CUFF REPAIR Left 01/2009  . s/p bladder surgury  09/2009   Dr. Terance Hart    Family History  Problem Relation Age of Onset  . Diabetes Sister        x 3  . Heart disease Sister        x2  . Diabetes Brother        x3  . Heart disease Brother        x2  . Coronary artery disease Other        female 1st degree  . Hypertension Other   . Colon cancer Neg Hx     Social History:  reports that she has quit smoking. She has never used smokeless tobacco. She reports that she does not drink alcohol or use drugs.  Review of  Systems:  Hypertension: Her blood pressure is treated with metoprolol, clonidine Twice a day and Exforge Followed by PCP  CKD: Her creatinine is slightly better and she is going to see a nephrologist this week   Lab Results  Component Value Date   CREATININE 1.43 (H) 02/02/2018   CREATININE 1.67 (H) 12/31/2017   CREATININE 1.39 (H) 08/07/2017   CREATININE 1.99 (H) 05/13/2017     Lipids: Followed by PCP, LDL has been inconsistently controlled She is also on amlodipine Taking  Lipitor 40 mg now,  lipids are not consistently controlled previously  Lab Results  Component Value Date   CHOL 188 12/31/2017   HDL 59.40 12/31/2017   LDLCALC 114 (H) 12/31/2017   LDLDIRECT 156.6 01/07/2011   TRIG 75.0 12/31/2017   CHOLHDL 3 12/31/2017     Post ablative hypothyroidism:  Previously she had I-131 treatment on 08/20/11 for Graves' disease  Her dose has been adjusted periodically Since her TSH was markedly increased in 3/19 her dose was increased up to 125 mcg She takes this daily before breakfast  Still compliant with her levothyroxine daily TSH is mildly suppressed now  Lab Results  Component Value Date   TSH 0.26 (L) 02/02/2018   TSH 10.81 (H) 12/31/2017   TSH 1.510 06/24/2017   FREET4 0.53 (L) 12/31/2017   FREET4 0.64 05/13/2017   FREET4 0.64 05/02/2017    On Gabapentin For leg pains    Examination:   BP 130/82 (BP Location: Left Arm, Patient Position: Sitting, Cuff Size: Large)   Pulse (!) 113   Ht 5\' 6"  (1.676 m)   Wt 280 lb (127 kg)   SpO2 96%   BMI 45.19 kg/m   Body mass index is 45.19 kg/m.     ASSESSMENT/ PLAN:   Diabetes type 2, uncontrolled with obesity:   See history of present illness for detailed discussion of current management, blood sugar patterns and problems identified  Her blood sugars are much better controlled with using Soliqua in addition to basal and mealtime insulin Weight appears to be improving Her blood sugars are reasonably good with fructosamine 296 and only occasional readings above target However unable to assess her pattern since she did not bring the meter that she is using exclusively for herself  Recommendations:  No change in basic treatment regimen  She will take extra 2 units for higher carbohydrate meals at least for the NovoLog  Also if eating a low-carb meal can take only 8 units  No change in [place]  A1c on the next visit  Hypothyroidism:  Needs to reduce her dose by half tablet weekly  since TSH is mildly decreased  LIPIDS: Needs follow-up levels on the next visit  HYPERTENSION: Controlled       Patient Instructions  Novolog 8-12 units based on carbs  Take 1/2 Synthroid on Sundays       Elayne Snare 02/03/2018, 9:46 PM   Note: This office note was prepared with Dragon voice recognition system technology. Any transcriptional errors that result from this process are unintentional.

## 2018-02-04 DIAGNOSIS — N3010 Interstitial cystitis (chronic) without hematuria: Secondary | ICD-10-CM | POA: Diagnosis not present

## 2018-02-04 DIAGNOSIS — I129 Hypertensive chronic kidney disease with stage 1 through stage 4 chronic kidney disease, or unspecified chronic kidney disease: Secondary | ICD-10-CM | POA: Diagnosis not present

## 2018-02-04 DIAGNOSIS — N183 Chronic kidney disease, stage 3 (moderate): Secondary | ICD-10-CM | POA: Diagnosis not present

## 2018-02-09 DIAGNOSIS — M545 Low back pain: Secondary | ICD-10-CM | POA: Diagnosis not present

## 2018-02-17 DIAGNOSIS — M545 Low back pain: Secondary | ICD-10-CM | POA: Diagnosis not present

## 2018-02-19 ENCOUNTER — Ambulatory Visit (INDEPENDENT_AMBULATORY_CARE_PROVIDER_SITE_OTHER): Payer: Medicare Other | Admitting: Family Medicine

## 2018-02-19 ENCOUNTER — Ambulatory Visit: Payer: Self-pay

## 2018-02-19 ENCOUNTER — Encounter: Payer: Self-pay | Admitting: Family Medicine

## 2018-02-19 VITALS — BP 150/84 | HR 91 | Ht 66.0 in | Wt 292.0 lb

## 2018-02-19 DIAGNOSIS — G8929 Other chronic pain: Secondary | ICD-10-CM | POA: Diagnosis not present

## 2018-02-19 DIAGNOSIS — M545 Low back pain: Secondary | ICD-10-CM | POA: Diagnosis not present

## 2018-02-19 DIAGNOSIS — M79645 Pain in left finger(s): Principal | ICD-10-CM

## 2018-02-19 DIAGNOSIS — M25531 Pain in right wrist: Secondary | ICD-10-CM | POA: Diagnosis not present

## 2018-02-19 DIAGNOSIS — M5412 Radiculopathy, cervical region: Secondary | ICD-10-CM

## 2018-02-19 DIAGNOSIS — M1812 Unilateral primary osteoarthritis of first carpometacarpal joint, left hand: Secondary | ICD-10-CM

## 2018-02-19 NOTE — Progress Notes (Signed)
[name] [name], [name] [address] Phone: [phone] Subjective:    I'm seeing this patient by the request  of:    CC: Neck pain and left arm pain follow-up  PYP:[id number]  [name] is a 67 y.o. female coming in with complaint of neck and left arm pain follow-up.  Was on the left side.  Had some weakness mostly in the C6 distribution.  States that there is worsening symptoms.  Feels like she is having less and less control of the hand itself.  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  Affecting daily activities.  Patient did have x-rays of the neck at last exam there were independently visualized by me showing. Degenerative disc disease C5-C7      Past Medical History:  Diagnosis Date  . Anxiety   . Arthritis   . Blood dyscrasia    "FREE BLEEDER"  . CERVICAL RADICULOPATHY, LEFT   . Chronic back pain   . CKD (chronic kidney disease)    DR. SANFORD  [facility]  . COMMON MIGRAINE   . CORONARY ARTERY DISEASE   . Cough    CURRENT COLD  . Cystitis   . DEPRESSION   . Diabetes mellitus, type II ([place])   . DIVERTICULOSIS-COLON   . Gastric ulcer 04/2008  . Gastroparesis   . GERD (gastroesophageal reflux disease)   . Hiatal hernia   . Hyperlipidemia   . Hypertension   . Hypothyroidism   . INSOMNIA-SLEEP DISORDER-UNSPEC   . Iron deficiency anemia   . Wears glasses    Past Surgical History:  Procedure Laterality Date  . ABDOMINAL HYSTERECTOMY    . ANTERIOR LAT LUMBAR FUSION Left 08/13/2017   Procedure: LEFT SIDED LUMBAR 3-4 LATERAL INTERBODY FUSION WITH INSTRUMENTATION AND ALLOGRAFT;  Surgeon: Phylliss Bob, MD;  Location: [facility];  Service: Orthopedics;  Laterality: Left;  LEFT SIDED LUMBAR 3-4 LATERAL INTERBODY FUSION WITH INSTRUMENTATION AND ALLOGRAFT; REQUEST 3 HOURS  . BACK SURGERY  03/2016  . CHOLECYSTECTOMY  06/2009  . COLONOSCOPY    . ESOPHAGOGASTRODUODENOSCOPY    . EYE  SURGERY Bilateral    lasik  . Gastric Wedge resection lipoma  11/2007   x2 with laparotomy and gastrostomy  . Left knee replacement    . Rigth knee replacement with revision  04/2008   Dr. Berenice Primas  . ROTATOR CUFF REPAIR Left 01/2009  . s/p bladder surgury  09/2009   Dr. Terance Hart   Social History   Socioeconomic History  . Marital status: Married    Spouse name: Not on file  . Number of children: Not on file  . Years of education: Not on file  . Highest education level: Not on file  Occupational History  . Occupation: disabled  Social Needs  . Financial resource strain: Not on file  . Food insecurity:    Worry: Not on file    Inability: Not on file  . Transportation needs:    Medical: Not on file    Non-medical: Not on file  Tobacco Use  . Smoking status: Former Research scientist (life sciences)  . Smokeless tobacco: Never Used  . Tobacco comment: quit 30 years ago  Substance and Sexual Activity  . Alcohol use: No    Alcohol/week: 0.0 oz  . Drug use: No  . Sexual activity: Not Currently    Birth control/protection: None  Lifestyle  . Physical activity:    Days per week: Not  on file    Minutes per session: Not on file  . Stress: Not on file  Relationships  . Social connections:    Talks on phone: Not on file    Gets together: Not on file    Attends religious service: Not on file    Active member of club or organization: Not on file    Attends meetings of clubs or organizations: Not on file    Relationship status: Not on file  Other Topics Concern  . Not on file  Social History Narrative   Patient gets regular exercise   Allergies  Allergen Reactions  . Hydrocodone Other (See Comments)    Tachycardia   . Ciprofloxacin Other (See Comments)    Dizziness   . Penicillins Hives    Has patient had a PCN reaction causing immediate rash, facial/tongue/throat swelling, SOB or lightheadedness with hypotension: No Has patient had a PCN reaction causing severe rash involving mucus membranes or  skin necrosis: No Has patient had a PCN reaction that required hospitalization No Has patient had a PCN reaction occurring within the last 10 years: No If all of the above answers are "NO", then may proceed with Cephalosporin use.    Family History  Problem Relation Age of Onset  . Diabetes Sister        x 3  . Heart disease Sister        x2  . Diabetes Brother        x3  . Heart disease Brother        x2  . Coronary artery disease Other        female 1st degree  . Hypertension Other   . Colon cancer Neg Hx      Past medical history, social, surgical and family history all reviewed in electronic medical record.  No pertanent information unless stated regarding to the chief complaint.   Review of Systems:Review of systems updated and as accurate   No headache, visual changes, nausea, vomiting, diarrhea, constipation, dizziness, abdominal pain, skin rash, fevers, chills, night sweats, weight loss, swollen lymph nodes, body aches, joint swelling, muscle aches, chest pain, shortness of breath, mood changes.   Objective  Blood pressure (!) 150/84, pulse 91, height 5\' 6"  (1.676 m), weight 292 lb (132.5 kg), SpO2 98 %.   General: No apparent distress alert and oriented x3 mood and affect normal, dressed appropriately.  HEENT: Pupils equal, extraocular movements intact  Respiratory: Patient's speak in full sentences and does not appear short of breath  Cardiovascular: No lower extremity edema, non tender, no erythema  Skin: Warm dry intact with no signs of infection or rash on extremities or on axial skeleton.  Abdomen: Soft nontender  Neuro: Cranial nerves II through XII are intact, neurovascularly intact in all extremities with 2+ DTRs and 2+ pulses.  Lymph: No lymphadenopathy of posterior or anterior cervical chain or axillae bilaterally.  Gait antalgic MSK:  tender with full range of motion and good stability and symmetric strength and tone of shoulders, elbows,  hip, knee and  ankles bilaterally.  Neck: Inspection loss of range of motion with increased lordosis. No palpable stepoffs. Positive Spurling's maneuver with radicular symptoms on the left side in the C6 distribution. Loss of range of motion in all planes especially extension. Patient does have some weakness in the C6 distribution with 3+ out of 5 strength compared to the contralateral side Negative Hoffman sign bilaterally Reflexes normal  Left hand exam shows the patient does have  thenar eminence wasting as well as positive grind test.  Procedure: Real-time Ultrasound Guided Injection of left CMC joint Device: GE Logiq Q7 Ultrasound guided injection is preferred based studies that show increased duration, increased effect, greater accuracy, decreased procedural pain, increased response rate, and decreased cost with ultrasound guided versus blind injection.  Verbal informed consent obtained.  Time-out conducted.  Noted no overlying erythema, induration, or other signs of local infection.  Skin prepped in a sterile fashion.  Local anesthesia: Topical Ethyl chloride.  With sterile technique and under real time ultrasound guidance: With a 25-gauge 1 inch needle patient was injected with 0.5 cc of 0.5% Marcaine and 0.5 cc of Kenalog 40 mg/mL Completed without difficulty  Pain immediately improved suggesting accurate placement of the medication.  Advised to call if fevers/chills, erythema, induration, drainage, or persistent bleeding.  Images permanently stored and available for review in the ultrasound unit.  Impression: Technically successful ultrasound guided injection.   Impression and Recommendations:     This case required medical decision making of moderate complexity.      Note: This dictation was prepared with Dragon dictation along with smaller phrase technology. Any transcriptional errors that result from this process are unintentional.

## 2018-02-19 NOTE — Patient Instructions (Signed)
Good to see you  [name] is yoru friend Tried injection in the thumb and I hope it helps If not helping in 2 weeks call me and we need a CT scan of your neck and then will need to consider epidural  Continue all other medications

## 2018-02-20 NOTE — Assessment & Plan Note (Signed)
Injected today.  Patient does have radicular symptoms though that is concerning for more of a weakness coming from the neck.  We discussed if this injection does not help that further work-up including MRI is necessary due to the increasing thenar eminence wasting as well as weakness.  Patient is in agreement with the plan.  He did feel somewhat better after the injection now.

## 2018-02-20 NOTE — Assessment & Plan Note (Signed)
Worsening even with patient taking prednisone and gabapentin.  Unable to do prednisone regularly secondary to patient being a brittle diabetic.  We discussed possibly increasing gabapentin which patient declined stating that she actually has not been taking the gabapentin regularly.  Depending on patient's response to the injection MRI may be necessary.  Patient could be a candidate for epidural

## 2018-03-24 ENCOUNTER — Other Ambulatory Visit (INDEPENDENT_AMBULATORY_CARE_PROVIDER_SITE_OTHER): Payer: Medicare Other

## 2018-03-24 ENCOUNTER — Ambulatory Visit (INDEPENDENT_AMBULATORY_CARE_PROVIDER_SITE_OTHER): Payer: Medicare Other | Admitting: Internal Medicine

## 2018-03-24 ENCOUNTER — Encounter: Payer: Self-pay | Admitting: Internal Medicine

## 2018-03-24 VITALS — BP 138/86 | HR 96 | Temp 98.4°F | Ht 66.0 in | Wt 291.0 lb

## 2018-03-24 DIAGNOSIS — R300 Dysuria: Secondary | ICD-10-CM

## 2018-03-24 DIAGNOSIS — I10 Essential (primary) hypertension: Secondary | ICD-10-CM

## 2018-03-24 DIAGNOSIS — E119 Type 2 diabetes mellitus without complications: Secondary | ICD-10-CM | POA: Diagnosis not present

## 2018-03-24 LAB — URINALYSIS, ROUTINE W REFLEX MICROSCOPIC
Bilirubin Urine: NEGATIVE
Hgb urine dipstick: NEGATIVE
Ketones, ur: NEGATIVE
Leukocytes, UA: NEGATIVE
Nitrite: NEGATIVE
RBC / HPF: NONE SEEN (ref 0–?)
Specific Gravity, Urine: 1.02 (ref 1.000–1.030)
Total Protein, Urine: NEGATIVE
Urine Glucose: NEGATIVE
Urobilinogen, UA: 0.2 (ref 0.0–1.0)
pH: 6 (ref 5.0–8.0)

## 2018-03-24 MED ORDER — SULFAMETHOXAZOLE-TRIMETHOPRIM 800-160 MG PO TABS: 1.0000 | ORAL_TABLET | Freq: Two times a day (BID) | ORAL | 0 refills | Status: DC

## 2018-03-24 MED ORDER — ONDANSETRON HCL 4 MG PO TABS: 4.0000 mg | ORAL_TABLET | Freq: Four times a day (QID) | ORAL | 2 refills | Status: DC | PRN

## 2018-03-24 NOTE — Patient Instructions (Signed)
Please take all new medication as prescribed - the antibiotic  Please continue all other medications as before, and refills have been done if requested.  Please have the pharmacy call with any other refills you may need.  Please keep your appointments with your specialists as you may have planned  Please go to the LAB in the Basement (turn left off the elevator) for the tests to be done today - just the urine testing today  You will be contacted by phone if any changes need to be made immediately.  Otherwise, you will receive a letter about your results with an explanation, but please check with MyChart first.  Please remember to sign up for MyChart if you have not done so, as this will be important to you in the future with finding out test results, communicating by private email, and scheduling acute appointments online when needed.  

## 2018-03-24 NOTE — Assessment & Plan Note (Signed)
stable overall by history and exam, recent data reviewed with pt, and pt to continue medical treatment as before,  to f/u any worsening symptoms or concerns BP Readings from Last 3 Encounters:  03/24/18 138/86  02/19/18 (!) 150/84  02/03/18 130/82

## 2018-03-24 NOTE — Assessment & Plan Note (Signed)
stable overall by history and exam, recent data reviewed with pt, and pt to continue medical treatment as before,  to f/u any worsening symptoms or concerns Lab Results  Component Value Date   HGBA1C 6.1 12/31/2017

## 2018-03-24 NOTE — Progress Notes (Signed)
Subjective:    Patient ID: [name], female    DOB: Jul 11, 1951, 67 y.o.   MRN: [id number]  HPI    Here with 3 days acute onset pain on urination, frequency and urgency as well as mild worsening of lower mid back pain on top of mild residual post op pain;  Denies urinary symptoms such as flank pain, hematuria or n/v, fever, chills. Pt denies chest pain, increased sob or doe, wheezing, orthopnea, PND, increased LE swelling, palpitations, dizziness or syncope.  Pt denies new neurological symptoms such as new headache, or facial or extremity weakness or numbness   Pt denies polydipsia, polyuria Past Medical History:  Diagnosis Date  . Anxiety   . Arthritis   . Blood dyscrasia    "FREE BLEEDER"  . CERVICAL RADICULOPATHY, LEFT   . Chronic back pain   . CKD (chronic kidney disease)    DR. SANFORD  [facility]  . COMMON MIGRAINE   . CORONARY ARTERY DISEASE   . Cough    CURRENT COLD  . Cystitis   . DEPRESSION   . Diabetes mellitus, type II ([place])   . DIVERTICULOSIS-COLON   . Gastric ulcer 04/2008  . Gastroparesis   . GERD (gastroesophageal reflux disease)   . Hiatal hernia   . Hyperlipidemia   . Hypertension   . Hypothyroidism   . INSOMNIA-SLEEP DISORDER-UNSPEC   . Iron deficiency anemia   . Wears glasses    Past Surgical History:  Procedure Laterality Date  . ABDOMINAL HYSTERECTOMY    . ANTERIOR LAT LUMBAR FUSION Left 08/13/2017   Procedure: LEFT SIDED LUMBAR 3-4 LATERAL INTERBODY FUSION WITH INSTRUMENTATION AND ALLOGRAFT;  Surgeon: Phylliss Bob, MD;  Location: [facility];  Service: Orthopedics;  Laterality: Left;  LEFT SIDED LUMBAR 3-4 LATERAL INTERBODY FUSION WITH INSTRUMENTATION AND ALLOGRAFT; REQUEST 3 HOURS  . BACK SURGERY  03/2016  . CHOLECYSTECTOMY  06/2009  . COLONOSCOPY    . ESOPHAGOGASTRODUODENOSCOPY    . EYE SURGERY Bilateral    lasik  . Gastric Wedge resection lipoma  11/2007   x2 with laparotomy and gastrostomy  . Left knee replacement    . Rigth knee  replacement with revision  04/2008   Dr. Berenice Primas  . ROTATOR CUFF REPAIR Left 01/2009  . s/p bladder surgury  09/2009   Dr. Terance Hart    reports that she has quit smoking. She has never used smokeless tobacco. She reports that she does not drink alcohol or use drugs. family history includes Coronary artery disease in her other; Diabetes in her brother and sister; Heart disease in her brother and sister; Hypertension in her other. Allergies  Allergen Reactions  . Hydrocodone Other (See Comments)    Tachycardia   . Ciprofloxacin Other (See Comments)    Dizziness   . Penicillins Hives    Has patient had a PCN reaction causing immediate rash, facial/tongue/throat swelling, SOB or lightheadedness with hypotension: No Has patient had a PCN reaction causing severe rash involving mucus membranes or skin necrosis: No Has patient had a PCN reaction that required hospitalization No Has patient had a PCN reaction occurring within the last 10 years: No If all of the above answers are "NO", then may proceed with Cephalosporin use.    Current Outpatient Medications on File Prior to Visit  Medication Sig Dispense Refill  . acetaminophen-codeine (TYLENOL #3) 300-30 MG tablet     . aspirin 81 MG EC tablet Take 81 mg by mouth every evening.     Marland Kitchen  atorvastatin (LIPITOR) 40 MG tablet Take 40 mg by mouth daily.    Marland Kitchen buPROPion (WELLBUTRIN XL) 150 MG 24 hr tablet Take 1 tablet (150 mg total) by mouth daily. 90 tablet 3  . ELMIRON 100 MG capsule Take 100 mg by mouth 2 (two) times daily.     Marland Kitchen estradiol (CLIMARA - DOSED IN MG/24 HR) 0.1 mg/24hr patch Place 1 patch (0.1 mg total) onto the skin once a week. 12 patch 4  . FLUoxetine (PROZAC) 20 MG capsule TAKE 20mg  BY MOUTH DAILY 90 capsule 3  . gabapentin (NEURONTIN) 100 MG capsule Take 2 capsules (200 mg total) by mouth at bedtime. 60 capsule 3  . glucose blood (ACCU-CHEK AVIVA PLUS) test strip USE TO TEST BLOOD SUGAR 4 TO 6 TIMES DAILY DX CODE E11.65 450 each 2    . HYDROcodone-acetaminophen (NORCO) 7.5-325 MG tablet     . insulin aspart (NOVOLOG FLEXPEN) 100 UNIT/ML FlexPen Inject 10 Units into the skin 3 (three) times daily with meals.    . Insulin Glargine-Lixisenatide (SOLIQUA) 100-33 UNT-MCG/ML SOPN Inject 34 Units into the skin daily with breakfast. 3 pen 3  . levETIRAcetam (KEPPRA) 250 MG tablet Take 250-500 mg by mouth 2 (two) times daily. Dose depends on level of pain.    Marland Kitchen levothyroxine (SYNTHROID, LEVOTHROID) 125 MCG tablet Take 1 tablet (125 mcg total) by mouth daily. 90 tablet 3  . losartan (COZAAR) 100 MG tablet Take 1 tablet (100 mg total) by mouth daily. 90 tablet 3  . naltrexone (DEPADE) 50 MG tablet Take 1 tablet (50 mg total) by mouth daily. 90 tablet 3  . progesterone (PROMETRIUM) 100 MG capsule Take 1 capsule (100 mg total) by mouth at bedtime. 90 capsule 4  . torsemide (DEMADEX) 20 MG tablet Take 20 mg by mouth daily.    Marland Kitchen trimethoprim (TRIMPEX) 100 MG tablet Take 100 mg by mouth every evening.   1   No current facility-administered medications on file prior to visit.    Review of Systems  Constitutional: Negative for other unusual diaphoresis or sweats HENT: Negative for ear discharge or swelling Eyes: Negative for other worsening visual disturbances Respiratory: Negative for stridor or other swelling  Gastrointestinal: Negative for worsening distension or other blood Genitourinary: Negative for retention or other urinary change Musculoskeletal: Negative for other MSK pain or swelling Skin: Negative for color change or other new lesions Neurological: Negative for worsening tremors and other numbness  Psychiatric/Behavioral: Negative for worsening agitation or other fatigue All other system neg per pt    Objective:   Physical Exam BP 138/86   Pulse 96   Temp 98.4 F (36.9 C) (Oral)   Ht 5\' 6"  (1.676 m)   Wt 291 lb (132 kg)   SpO2 97%   BMI 46.97 kg/m  VS noted, mild ill Constitutional: Pt appears in NAD HENT:  Head: NCAT.  Right Ear: External ear normal.  Left Ear: External ear normal.  Eyes: . Pupils are equal, round, and reactive to light. Conjunctivae and EOM are normal Nose: without d/c or deformity Neck: Neck supple. Gross normal ROM Cardiovascular: Normal rate and regular rhythm.   Pulmonary/Chest: Effort normal and breath sounds without rales or wheezing.  Abd:  Soft, ND, + BS, no organomegaly with mild low abd tender without guarding or reobund Neurological: Pt is alert. At baseline orientation, motor grossly intact Skin: Skin is warm. No rashes, other new lesions, no LE edema Psychiatric: Pt behavior is normal without agitation  No other exam  findings  Lab Results  Component Value Date   WBC 7.5 08/07/2017   HGB 9.2 (L) 08/14/2017   HCT 27.0 (L) 08/14/2017   PLT 270 08/07/2017   GLUCOSE 118 (H) 02/02/2018   CHOL 188 12/31/2017   TRIG 75.0 12/31/2017   HDL 59.40 12/31/2017   LDLDIRECT 156.6 01/07/2011   LDLCALC 114 (H) 12/31/2017   ALT 26 12/31/2017   AST 19 12/31/2017   NA 139 02/02/2018   K 4.4 02/02/2018   CL 106 02/02/2018   CREATININE 1.43 (H) 02/02/2018   BUN 26 (H) 02/02/2018   CO2 28 02/02/2018   TSH 0.26 (L) 02/02/2018   INR 1.21 08/07/2017   HGBA1C 6.1 12/31/2017   MICROALBUR 2.0 (H) 12/31/2017       Assessment & Plan:

## 2018-03-24 NOTE — Assessment & Plan Note (Signed)
C/w likely UTI, for urine studies, empiric antibx,  to f/u any worsening symptoms or concerns

## 2018-03-25 LAB — URINE CULTURE
MICRO NUMBER:: 90729989
SPECIMEN QUALITY:: ADEQUATE

## 2018-03-26 ENCOUNTER — Encounter: Payer: Self-pay | Admitting: Internal Medicine

## 2018-03-30 DIAGNOSIS — N3010 Interstitial cystitis (chronic) without hematuria: Secondary | ICD-10-CM | POA: Diagnosis not present

## 2018-04-02 ENCOUNTER — Other Ambulatory Visit: Payer: Medicare Other

## 2018-04-02 DIAGNOSIS — R350 Frequency of micturition: Secondary | ICD-10-CM | POA: Diagnosis not present

## 2018-04-02 DIAGNOSIS — N3010 Interstitial cystitis (chronic) without hematuria: Secondary | ICD-10-CM | POA: Diagnosis not present

## 2018-04-05 ENCOUNTER — Emergency Department (HOSPITAL_COMMUNITY)
Admission: EM | Admit: 2018-04-05 | Discharge: 2018-04-05 | Disposition: A | Discharge status: Home / Self Care | Payer: Medicare Other | Attending: Emergency Medicine | Admitting: Emergency Medicine

## 2018-04-05 ENCOUNTER — Other Ambulatory Visit: Payer: Self-pay

## 2018-04-05 ENCOUNTER — Encounter (HOSPITAL_COMMUNITY): Payer: Self-pay

## 2018-04-05 ENCOUNTER — Emergency Department (HOSPITAL_COMMUNITY): Payer: Medicare Other

## 2018-04-05 DIAGNOSIS — Z96653 Presence of artificial knee joint, bilateral: Secondary | ICD-10-CM | POA: Insufficient documentation

## 2018-04-05 DIAGNOSIS — N3090 Cystitis, unspecified without hematuria: Secondary | ICD-10-CM

## 2018-04-05 DIAGNOSIS — Z7982 Long term (current) use of aspirin: Secondary | ICD-10-CM | POA: Insufficient documentation

## 2018-04-05 DIAGNOSIS — I2510 Atherosclerotic heart disease of native coronary artery without angina pectoris: Secondary | ICD-10-CM | POA: Diagnosis not present

## 2018-04-05 DIAGNOSIS — I129 Hypertensive chronic kidney disease with stage 1 through stage 4 chronic kidney disease, or unspecified chronic kidney disease: Secondary | ICD-10-CM | POA: Diagnosis not present

## 2018-04-05 DIAGNOSIS — Z794 Long term (current) use of insulin: Secondary | ICD-10-CM | POA: Diagnosis not present

## 2018-04-05 DIAGNOSIS — E039 Hypothyroidism, unspecified: Secondary | ICD-10-CM | POA: Insufficient documentation

## 2018-04-05 DIAGNOSIS — N184 Chronic kidney disease, stage 4 (severe): Secondary | ICD-10-CM | POA: Diagnosis not present

## 2018-04-05 DIAGNOSIS — Z87891 Personal history of nicotine dependence: Secondary | ICD-10-CM | POA: Insufficient documentation

## 2018-04-05 DIAGNOSIS — E1122 Type 2 diabetes mellitus with diabetic chronic kidney disease: Secondary | ICD-10-CM | POA: Insufficient documentation

## 2018-04-05 DIAGNOSIS — Z79899 Other long term (current) drug therapy: Secondary | ICD-10-CM | POA: Insufficient documentation

## 2018-04-05 DIAGNOSIS — R350 Frequency of micturition: Secondary | ICD-10-CM | POA: Diagnosis not present

## 2018-04-05 DIAGNOSIS — R1030 Lower abdominal pain, unspecified: Secondary | ICD-10-CM | POA: Diagnosis present

## 2018-04-05 DIAGNOSIS — K5730 Diverticulosis of large intestine without perforation or abscess without bleeding: Secondary | ICD-10-CM | POA: Diagnosis not present

## 2018-04-05 DIAGNOSIS — R112 Nausea with vomiting, unspecified: Secondary | ICD-10-CM | POA: Diagnosis not present

## 2018-04-05 DIAGNOSIS — R1032 Left lower quadrant pain: Secondary | ICD-10-CM | POA: Diagnosis not present

## 2018-04-05 LAB — CBC
HCT: 37.7 % (ref 36.0–46.0)
Hemoglobin: 11.7 g/dL — ABNORMAL LOW (ref 12.0–15.0)
MCH: 31.4 pg (ref 26.0–34.0)
MCHC: 31 g/dL (ref 30.0–36.0)
MCV: 101.1 fL — ABNORMAL HIGH (ref 78.0–100.0)
Platelets: 280 10*3/uL (ref 150–400)
RBC: 3.73 MIL/uL — ABNORMAL LOW (ref 3.87–5.11)
RDW: 13.2 % (ref 11.5–15.5)
WBC: 5.9 10*3/uL (ref 4.0–10.5)

## 2018-04-05 LAB — COMPREHENSIVE METABOLIC PANEL
ALT: 16 U/L (ref 0–44)
AST: 14 U/L — ABNORMAL LOW (ref 15–41)
Albumin: 3.8 g/dL (ref 3.5–5.0)
Alkaline Phosphatase: 86 U/L (ref 38–126)
Anion gap: 9 (ref 5–15)
BUN: 16 mg/dL (ref 8–23)
CO2: 22 mmol/L (ref 22–32)
Calcium: 9.2 mg/dL (ref 8.9–10.3)
Chloride: 107 mmol/L (ref 98–111)
Creatinine, Ser: 1.53 mg/dL — ABNORMAL HIGH (ref 0.44–1.00)
GFR calc Af Amer: 39 mL/min — ABNORMAL LOW (ref >60)
GFR calc non Af Amer: 34 mL/min — ABNORMAL LOW (ref >60)
Glucose, Bld: 196 mg/dL — ABNORMAL HIGH (ref 70–99)
Potassium: 4.3 mmol/L (ref 3.5–5.1)
Sodium: 138 mmol/L (ref 135–145)
Total Bilirubin: 0.5 mg/dL (ref 0.3–1.2)
Total Protein: 7.6 g/dL (ref 6.5–8.1)

## 2018-04-05 LAB — URINALYSIS, ROUTINE W REFLEX MICROSCOPIC
Bilirubin Urine: NEGATIVE
Glucose, UA: NEGATIVE mg/dL
Hgb urine dipstick: NEGATIVE
Ketones, ur: NEGATIVE mg/dL
Leukocytes, UA: NEGATIVE
Nitrite: POSITIVE — AB
Protein, ur: NEGATIVE mg/dL
Specific Gravity, Urine: >1.046 — ABNORMAL HIGH (ref 1.005–1.030)
pH: 6 (ref 5.0–8.0)

## 2018-04-05 LAB — LIPASE, BLOOD: Lipase: 31 U/L (ref 11–51)

## 2018-04-05 MED ORDER — PHENAZOPYRIDINE HCL 200 MG PO TABS
200.0000 mg | ORAL_TABLET | Freq: Once | ORAL | Status: AC
  Administered 2018-04-05: 200 mg via ORAL
  Filled 2018-04-05: qty 1

## 2018-04-05 MED ORDER — SODIUM CHLORIDE 0.9 % IV BOLUS
1000.0000 mL | Freq: Once | INTRAVENOUS | Status: AC
  Administered 2018-04-05: 1000 mL via INTRAVENOUS

## 2018-04-05 MED ORDER — SODIUM CHLORIDE 0.9 % IV SOLN
1.0000 g | Freq: Once | INTRAVENOUS | Status: DC
  Filled 2018-04-05: qty 10

## 2018-04-05 MED ORDER — ONDANSETRON HCL 4 MG/2ML IJ SOLN
4.0000 mg | Freq: Once | INTRAMUSCULAR | Status: AC
  Administered 2018-04-05: 4 mg via INTRAVENOUS
  Filled 2018-04-05: qty 2

## 2018-04-05 MED ORDER — ONDANSETRON 4 MG PO TBDP: 4.0000 mg | ORAL_TABLET | Freq: Three times a day (TID) | ORAL | 0 refills | Status: DC | PRN

## 2018-04-05 MED ORDER — IOPAMIDOL (ISOVUE-300) INJECTION 61%
100.0000 mL | Freq: Once | INTRAVENOUS | Status: AC | PRN
  Administered 2018-04-05: 100 mL via INTRAVENOUS

## 2018-04-05 MED ORDER — CEFTRIAXONE SODIUM 1 G IJ SOLR
1.0000 g | Freq: Once | INTRAMUSCULAR | Status: AC
  Administered 2018-04-05: 1 g via INTRAMUSCULAR
  Filled 2018-04-05: qty 10

## 2018-04-05 MED ORDER — PHENAZOPYRIDINE HCL 200 MG PO TABS: 200.0000 mg | ORAL_TABLET | Freq: Three times a day (TID) | ORAL | 0 refills | Status: DC

## 2018-04-05 MED ORDER — LIDOCAINE HCL 1 % IJ SOLN
INTRAMUSCULAR | Status: AC
  Administered 2018-04-05: 2.1 mL
  Filled 2018-04-05: qty 20

## 2018-04-05 MED ORDER — IOPAMIDOL (ISOVUE-300) INJECTION 61%
INTRAVENOUS | Status: AC
  Filled 2018-04-05: qty 100

## 2018-04-05 MED ORDER — MORPHINE SULFATE (PF) 4 MG/ML IV SOLN
4.0000 mg | Freq: Once | INTRAVENOUS | Status: AC
  Administered 2018-04-05: 4 mg via INTRAVENOUS
  Filled 2018-04-05: qty 1

## 2018-04-05 MED ORDER — OXYCODONE-ACETAMINOPHEN 5-325 MG PO TABS: 1.0000 | ORAL_TABLET | ORAL | 0 refills | Status: DC | PRN

## 2018-04-05 MED ORDER — CEPHALEXIN 500 MG PO CAPS: 500.0000 mg | ORAL_CAPSULE | Freq: Four times a day (QID) | ORAL | 0 refills | Status: DC

## 2018-04-05 NOTE — ED Provider Notes (Signed)
[facility] DEPT Provider Note   CSN: [id number] Arrival date & time: 04/05/18  1136     History   Chief Complaint Chief Complaint  Patient presents with  . Abdominal Pain    LLQ    HPI [name] is a 67 y.o. female.  Pt presents to the ED today with llq abd pain.  Pt has been treated for UTI with bactrim by her pcp.  Her doctor did a urine cx which was contaminated.  The pt said her sx are worsening and they are traveling up her left back.  Pt feels nauseous and has vomited.  No fevers.       Past Medical History:  Diagnosis Date  . Anxiety   . Arthritis   . Blood dyscrasia    "FREE BLEEDER"  . CERVICAL RADICULOPATHY, LEFT   . Chronic back pain   . CKD (chronic kidney disease)    DR. SANFORD  [facility]  . COMMON MIGRAINE   . CORONARY ARTERY DISEASE   . Cough    CURRENT COLD  . Cystitis   . DEPRESSION   . Diabetes mellitus, type II ([place])   . DIVERTICULOSIS-COLON   . Gastric ulcer 04/2008  . Gastroparesis   . GERD (gastroesophageal reflux disease)   . Hiatal hernia   . Hyperlipidemia   . Hypertension   . Hypothyroidism   . INSOMNIA-SLEEP DISORDER-UNSPEC   . Iron deficiency anemia   . Wears glasses     Patient Active Problem List   Diagnosis Date Noted  . Cervical radiculopathy at C6 01/28/2018  . Left lateral epicondylitis 01/23/2018  . Obesity 01/23/2018  . Weakness 05/01/2017  . Hypothyroidism 05/01/2017  . Hyperkalemia 05/01/2017  . Acute renal failure superimposed on stage 4 chronic kidney disease ([place]) 05/01/2017  . Hypotension 05/01/2017  . Sepsis ([place]) 05/01/2017  . Dysuria 04/23/2017  . Right leg swelling 12/27/2016  . Leg pain, lateral, right 11/05/2016  . Radiculopathy 04/03/2016  . Hypersomnolence 11/23/2015  . Right sided abdominal pain 07/25/2015  . Malaise and fatigue 11/05/2014  . Fracture of fifth metacarpal bone of right hand 07/08/2014  . Right cervical radiculopathy 06/27/2014  .  Chronic interstitial cystitis 05/23/2014  . Osteoarthritis of [place] joint of thumb 04/11/2014  . De Quervain's tenosynovitis, left 03/14/2014  . CKD (chronic kidney disease) stage 4, GFR 15-29 ml/min ([facility]) 03/03/2014  . Lower back pain 03/03/2014  . Ingrown nail 03/03/2014  . Peripheral edema 02/23/2014  . Lateral epicondylitis of right elbow 01/31/2014  . Neck pain on left side 11/19/2013  . Abdominal discomfort 10/12/2012  . Right lumbar radiculopathy 05/11/2012  . Peripheral neuropathy ([place]) 05/11/2012  . Vertigo 03/29/2012  . Headache(784.0) 03/10/2012  . Lumbar radiculopathy 10/24/2011  . 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 Right leg weakness 04/04/2011  . Hematochezia 01/07/2011  . Preventative health care 01/07/2011  . CHOLELITHIASIS 05/04/2009  . GERD 04/26/2009  . Gastroparesis 04/26/2009  . ULCER-GASTRIC 03/15/2009  . DIVERTICULOSIS-COLON 03/15/2009  . Cervical radiculopathy 10/28/2008  . MENOPAUSAL DISORDER 06/29/2008  . Pain in joint, shoulder region 10/12/2007  . INSOMNIA-SLEEP DISORDER-UNSPEC 09/01/2007  . Depression 09/01/2007  . ANEMIA-NOS 08/13/2007  . COMMON MIGRAINE 08/13/2007  . 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 Diabetes ([place]) 05/06/2007  . HLD (hyperlipidemia) 05/06/2007  . ANXIETY 05/06/2007  . Essential hypertension 05/06/2007  . Coronary atherosclerosis 05/06/2007    Past Surgical History:  Procedure Laterality Date  . ABDOMINAL HYSTERECTOMY    . ANTERIOR LAT  LUMBAR FUSION Left 08/13/2017   Procedure: LEFT SIDED LUMBAR 3-4 LATERAL INTERBODY FUSION WITH INSTRUMENTATION AND ALLOGRAFT;  Surgeon: Phylliss Bob, MD;  Location: [facility];  Service: Orthopedics;  Laterality: Left;  LEFT SIDED LUMBAR 3-4 LATERAL INTERBODY FUSION WITH INSTRUMENTATION AND ALLOGRAFT; REQUEST 3 HOURS  . BACK SURGERY  03/2016  . CHOLECYSTECTOMY  06/2009  . COLONOSCOPY    . ESOPHAGOGASTRODUODENOSCOPY    . EYE SURGERY Bilateral    lasik  . Gastric Wedge resection  lipoma  11/2007   x2 with laparotomy and gastrostomy  . Left knee replacement    . Rigth knee replacement with revision  04/2008   Dr. Berenice Primas  . ROTATOR CUFF REPAIR Left 01/2009  . s/p bladder surgury  09/2009   Dr. Terance Hart     OB History    Gravida  3   Para  3   Term      Preterm      AB      Living  3     SAB      TAB      Ectopic      Multiple      Live Births               Home Medications    Prior to Admission medications   Medication Sig Start Date End Date Taking? Authorizing Provider  acetaminophen-codeine (TYLENOL #3) 300-30 MG tablet Take 3 tablets by mouth every 8 (eight) hours as needed for moderate pain.  11/28/17  Yes [provider]  aspirin 81 MG EC tablet Take 81 mg by mouth every evening.    Yes [provider]  atorvastatin (LIPITOR) 40 MG tablet Take 40 mg by mouth daily.   Yes [provider]  buPROPion (WELLBUTRIN XL) 150 MG 24 hr tablet Take 1 tablet (150 mg total) by mouth daily. 01/20/18  Yes Biagio Borg, MD  ELMIRON 100 MG capsule Take 100 mg by mouth 2 (two) times daily.  07/20/13  Yes [provider]  estradiol (CLIMARA - DOSED IN MG/24 HR) 0.1 mg/24hr patch Place 1 patch (0.1 mg total) onto the skin once a week. 07/31/17  Yes [name], MD  FLUoxetine (PROZAC) 20 MG capsule TAKE 20mg  BY MOUTH DAILY 10/22/17  Yes Biagio Borg, MD  gabapentin (NEURONTIN) 100 MG capsule Take 2 capsules (200 mg total) by mouth at bedtime. 01/28/18  Yes Hulan Saas M, DO  glucose blood (ACCU-CHEK AVIVA PLUS) test strip USE TO TEST BLOOD SUGAR 4 TO 6 TIMES DAILY DX CODE E11.65 01/07/18  Yes Elayne Snare, MD  insulin aspart (NOVOLOG FLEXPEN) 100 UNIT/ML FlexPen Inject 10 Units into the skin 3 (three) times daily with meals.   Yes [provider]  Insulin Glargine-Lixisenatide (SOLIQUA) 100-33 UNT-MCG/ML SOPN Inject 34 Units into the skin daily with breakfast. 01/16/18  Yes Elayne Snare, MD  levETIRAcetam  (KEPPRA) 250 MG tablet Take 250-500 mg by mouth 2 (two) times daily. Dose depends on level of pain. 04/24/17  Yes [provider]  levothyroxine (SYNTHROID, LEVOTHROID) 125 MCG tablet Take 1 tablet (125 mcg total) by mouth daily. 01/12/18  Yes Elayne Snare, MD  losartan (COZAAR) 100 MG tablet Take 1 tablet (100 mg total) by mouth daily. 10/22/17  Yes Biagio Borg, MD  naltrexone (DEPADE) 50 MG tablet Take 1 tablet (50 mg total) by mouth daily. 01/20/18  Yes Biagio Borg, MD  ondansetron (ZOFRAN) 4 MG tablet Take 1 tablet (4 mg total) by  mouth every 6 (six) hours as needed. for nausea 03/24/18  Yes Biagio Borg, MD  progesterone (PROMETRIUM) 100 MG capsule Take 1 capsule (100 mg total) by mouth at bedtime. 07/03/17  Yes [name], MD  torsemide (DEMADEX) 20 MG tablet Take 20 mg by mouth daily.   Yes [provider]  cephALEXin (KEFLEX) 500 MG capsule Take 1 capsule (500 mg total) by mouth 4 (four) times daily. 04/05/18   Isla Pence, MD  cyclobenzaprine (FLEXERIL) 10 MG tablet Take 10 mg by mouth 2 (two) times daily as needed for muscle spasms. 04/01/18   [provider]  ondansetron (ZOFRAN ODT) 4 MG disintegrating tablet Take 1 tablet (4 mg total) by mouth every 8 (eight) hours as needed. 04/05/18   Isla Pence, MD  oxyCODONE-acetaminophen (PERCOCET/ROXICET) 5-325 MG tablet Take 1 tablet by mouth every 4 (four) hours as needed for severe pain. 04/05/18   Isla Pence, MD  phenazopyridine (PYRIDIUM) 200 MG tablet Take 1 tablet (200 mg total) by mouth 3 (three) times daily. 04/05/18   Isla Pence, MD  sulfamethoxazole-trimethoprim (BACTRIM DS,SEPTRA DS) 800-160 MG tablet Take 1 tablet by mouth 2 (two) times daily. Patient not taking: Reported on 04/05/2018 03/24/18   Biagio Borg, MD    Family History Family History  Problem Relation Age of Onset  . Diabetes Sister        x 3  . Heart disease Sister        x2  . Diabetes Brother        x3  . Heart  disease Brother        x2  . Coronary artery disease Other        female 1st degree  . Hypertension Other   . Colon cancer Neg Hx     Social History Social History   Tobacco Use  . Smoking status: Former Research scientist (life sciences)  . Smokeless tobacco: Never Used  . Tobacco comment: quit 30 years ago  Substance Use Topics  . Alcohol use: No    Alcohol/week: 0.0 oz  . Drug use: No     Allergies   Hydrocodone; Ciprofloxacin; and Penicillins   Review of Systems Review of Systems  Gastrointestinal: Positive for abdominal pain, nausea and vomiting.  Genitourinary: Positive for dysuria and frequency.  All other systems reviewed and are negative.    Physical Exam Updated Vital Signs BP (!) 144/90   Pulse 67   Temp 98.1 F (36.7 C) (Oral)   Resp 18   Ht 5\' 6"  (1.676 m)   Wt 121.6 kg (268 lb)   SpO2 100%   BMI 43.26 kg/m   Physical Exam  Constitutional: She is oriented to person, place, and time. She appears well-developed and well-nourished.  HENT:  Head: Normocephalic and atraumatic.  Mouth/Throat: Oropharynx is clear and moist.  Eyes: Pupils are equal, round, and reactive to light. EOM are normal.  Cardiovascular: Normal rate, regular rhythm, normal heart sounds and intact distal pulses.  Pulmonary/Chest: Effort normal and breath sounds normal.  Abdominal: Normal appearance and bowel sounds are normal. There is tenderness in the suprapubic area.  Neurological: She is alert and oriented to person, place, and time.  Skin: Skin is warm. Capillary refill takes less than 2 seconds.  Psychiatric: She has a normal mood and affect. Her behavior is normal.  Nursing note and vitals reviewed.    ED Treatments / Results  Labs (all labs ordered are listed, but only abnormal results are displayed) Labs Reviewed  COMPREHENSIVE METABOLIC PANEL - Abnormal; Notable for the following components:      Result Value   Glucose, Bld 196 (*)    Creatinine, Ser 1.53 (*)    AST 14 (*)    GFR calc  non Af Amer 34 (*)    GFR calc Af Amer 39 (*)    All other components within normal limits  CBC - Abnormal; Notable for the following components:   RBC 3.73 (*)    Hemoglobin 11.7 (*)    MCV 101.1 (*)    All other components within normal limits  URINALYSIS, ROUTINE W REFLEX MICROSCOPIC - Abnormal; Notable for the following components:   Specific Gravity, Urine >1.046 (*)    Nitrite POSITIVE (*)    Bacteria, UA MANY (*)    All other components within normal limits  URINE CULTURE  LIPASE, BLOOD    EKG None  Radiology Ct Abdomen Pelvis W Contrast  Result Date: 04/05/2018 CLINICAL DATA:  Left lower quadrant abdominal pain with nausea and vomiting for 2 weeks. Cystitis. EXAM: CT ABDOMEN AND PELVIS WITH CONTRAST TECHNIQUE: Multidetector CT imaging of the abdomen and pelvis was performed using the standard protocol following bolus administration of intravenous contrast. CONTRAST:  170mL ISOVUE-300 IOPAMIDOL (ISOVUE-300) INJECTION 61% COMPARISON:  CT abdomen 05/01/2017 FINDINGS: Lower chest: Subpleural nodularity in the right lower lobe on image 12/7 appears to be stable from 2012 hence likely benign. Hepatobiliary: Cholecystectomy.  Otherwise unremarkable. Pancreas: Unremarkable Spleen: Unremarkable Adrenals/Urinary Tract: Retained fetal lobulation of the kidneys. No findings of pyelonephritis. Upper normal thickness of the urinary bladder wall although this may be due to nondistention. No gas in the urinary bladder. Stomach/Bowel: Postoperative findings in the stomach. Diverticulum of the distal duodenum. Segments of the appendix are visualized and appear normal. Sigmoid colon diverticulosis is present without active diverticulitis. The margin of the descending colon extends towards a new left lumbar hernia shown on images 41-42 of series 2 but without overt herniation of the bowel observed. Vascular/Lymphatic: Aortoiliac atherosclerotic vascular disease. No pathologic adenopathy identified.  Reproductive: Uterus absent.  Adnexa unremarkable. Other: No supplemental non-categorized findings. Musculoskeletal: Left lumbar hernia is noted above. Probably indirect left inguinal hernia containing adipose tissue. Small suspected supraumbilical hernia containing adipose tissue. Posterolateral rod and pedicle screw fixation at L3-L4-L5 with solid interbody graft bridging at L3-4, and a lateral plate and screw fixator also at L3-4. There is potentially some right foraminal narrowing at L4-5 due to intervertebral spurring. IMPRESSION: 1. New left lumbar hernia containing adipose tissue. A margin of the descending colon extends to the ostium of the hernia without significantly extending into the hernia itself as shown on image 65/5. 2. No findings of pyelonephritis. Borderline wall thickening in the urinary bladder. This could be from cystitis or nondistention. 3. Mild sigmoid colon diverticulosis. 4.  Aortic Atherosclerosis (ICD10-I70.0). 5. Small left groin hernia containing adipose tissue, probably an indirect inguinal hernia. 6. Postoperative findings in the lumbar spine, potentially mild right foraminal narrowing at L4-5 due to spurring. Electronically Signed   By: Van Clines M.D.   On: 04/05/2018 14:57    Procedures Procedures (including critical care time)  Medications Ordered in ED Medications  iopamidol (ISOVUE-300) 61 % injection (has no administration in time range)  cefTRIAXone (ROCEPHIN) 1 g in sodium chloride 0.9 % 100 mL IVPB (1 g Intravenous New Bag/Given 04/05/18 1602)  sodium chloride 0.9 % bolus 1,000 mL (1,000 mLs Intravenous New Bag/Given 04/05/18 1404)  ondansetron (ZOFRAN) injection 4 mg (4 mg Intravenous  Given 04/05/18 1404)  morphine 4 MG/ML injection 4 mg (4 mg Intravenous Given 04/05/18 1404)  iopamidol (ISOVUE-300) 61 % injection 100 mL (100 mLs Intravenous Contrast Given 04/05/18 1414)  phenazopyridine (PYRIDIUM) tablet 200 mg (200 mg Oral Given 04/05/18 1602)      Initial Impression / Assessment and Plan / ED Course  I have reviewed the triage vital signs and the nursing notes.  Pertinent labs & imaging results that were available during my care of the patient were reviewed by me and considered in my medical decision making (see chart for details).    Pt is feeling much better.  We will change abx to keflex.  She is stable for d/c.  Return if worse.  F/u with pcp.  Final Clinical Impressions(s) / ED Diagnoses   Final diagnoses:  Cystitis    ED Discharge Orders        Ordered    oxyCODONE-acetaminophen (PERCOCET/ROXICET) 5-325 MG tablet  Every 4 hours PRN     04/05/18 1608    cephALEXin (KEFLEX) 500 MG capsule  4 times daily     04/05/18 1608    phenazopyridine (PYRIDIUM) 200 MG tablet  3 times daily     04/05/18 1608    ondansetron (ZOFRAN ODT) 4 MG disintegrating tablet  Every 8 hours PRN     04/05/18 1608       Isla Pence, MD 04/05/18 1609

## 2018-04-05 NOTE — ED Notes (Signed)
Bed: WA02 Expected date:  Expected time:  Means of arrival:  Comments: 

## 2018-04-05 NOTE — ED Notes (Signed)
Catheterization attempted x2 by Tia NT and Charmella NT. This RN was not permitted to assist. She states that she is only comfortable with females catheterizing. Charge RN and MD made aware.

## 2018-04-05 NOTE — ED Triage Notes (Addendum)
PT C/O LLQ ABDOMINAL PAIN WITH N/V X2 WEEKS. PT STS SHE WAS SX'D WITH CYSTITIS BY [facility], AND HAS BEEN TAKING ANTIBIOTICS W/O RELIEF.

## 2018-04-05 NOTE — ED Notes (Signed)
Patient transported to CT, unable to obtain urine sample at this time.

## 2018-04-05 NOTE — ED Notes (Signed)
Pt provided with 2 warm blankets.  

## 2018-04-05 NOTE — ED Notes (Signed)
When entering room to assess patient. She states, "I'm going to need a female nurse if you are going to get urine."

## 2018-04-07 LAB — URINE CULTURE: Culture: >=100000 — AB

## 2018-04-08 ENCOUNTER — Telehealth: Payer: Self-pay | Admitting: *Deleted

## 2018-04-08 NOTE — Telephone Encounter (Signed)
Post ED Visit - Positive Culture Follow-up  Culture report reviewed by antimicrobial stewardship pharmacist:  []  Elenor Quinones, Pharm.D. []  Heide Guile, Pharm.D., BCPS AQ-ID []  Parks Neptune, Pharm.D., BCPS []  Alycia Rossetti, Pharm.D., BCPS []  [place], [place].D., BCPS, AAHIVP []  Legrand Como, Pharm.D., BCPS, AAHIVP [x]  Salome Arnt, PharmD, BCPS []  Johnnette Gourd, PharmD, BCPS []  Hughes Better, PharmD, BCPS []  Leeroy Cha, PharmD  Positive urine culture Treated with Cephalexin, organism sensitive to the same and no further patient follow-up is required at this time.  Harlon Flor [facility] 04/08/2018, 9:47 AM

## 2018-04-10 ENCOUNTER — Encounter: Payer: Self-pay | Admitting: Endocrinology

## 2018-04-10 ENCOUNTER — Ambulatory Visit (INDEPENDENT_AMBULATORY_CARE_PROVIDER_SITE_OTHER): Payer: Medicare Other | Admitting: Endocrinology

## 2018-04-10 VITALS — BP 144/84 | HR 128 | Ht 66.0 in | Wt 280.4 lb

## 2018-04-10 DIAGNOSIS — Z794 Long term (current) use of insulin: Secondary | ICD-10-CM | POA: Diagnosis not present

## 2018-04-10 DIAGNOSIS — E669 Obesity, unspecified: Secondary | ICD-10-CM

## 2018-04-10 DIAGNOSIS — E1169 Type 2 diabetes mellitus with other specified complication: Secondary | ICD-10-CM

## 2018-04-10 DIAGNOSIS — N183 Chronic kidney disease, stage 3 unspecified: Secondary | ICD-10-CM

## 2018-04-10 DIAGNOSIS — E1165 Type 2 diabetes mellitus with hyperglycemia: Secondary | ICD-10-CM

## 2018-04-10 LAB — POCT GLYCOSYLATED HEMOGLOBIN (HGB A1C): Hemoglobin A1C: 5.7 % — AB (ref 4.0–5.6)

## 2018-04-10 LAB — GLUCOSE, POCT (MANUAL RESULT ENTRY): POC Glucose: 213 mg/dl — AB (ref 70–99)

## 2018-04-10 NOTE — Patient Instructions (Signed)
Check blood sugars on waking up  3/7  Also check blood sugars about 2 hours after a meal and do this after different meals by rotation  Recommended blood sugar levels on waking up is 90-130 and about 2 hours after meal is 130-160  Please bring your blood sugar monitor to each visit, thank you  Soliqua 15 now and keep up with am sugar levels

## 2018-04-10 NOTE — Progress Notes (Signed)
[name] is a 67 y.o. female.    Reason for Appointment: Diabetes follow-up   History of Present Illness   Diagnosis: Type 2 DIABETES MELITUS, date of diagnosis: 2000  Previous history: She had initially been treated with metformin and Amaryl and subsequently given Victoza which helped overall control and weight loss. In 9/13 because of significant hyperglycemia she was started on basal bolus insulin also. Had required relatively small doses of insulin. Her blood sugar control has been excellent with A1c between 5.3-6.0 although this may be relatively higher than expected. Tends to have relatively higher readings after supper In 6/15 she was told to resume her Victoza and stop her Lantus since she had been gaining weight with Lantus and NovoLog regimen.   Recent history:     INSULIN regimen: Novolog 10 units before meals; Soliqua 34 units once daily   Non-insulin hypoglycemic drugs: Soliqua   Her A1c is now lower at 5.7, previously 6.1  Current blood sugar patterns and management:  She has been able to continue [place] since about 12/2017  However the last week or so because of decreased appetite and nausea she stopped taking all her insulin and Soliqua but also has not checked her sugars since last Friday  Blood sugars have been quite erratic over the last month especially after meals and not clear if she is taking her NovoLog consistently  FASTING blood sugars have been generally fairly good but also has had readings as high as 350 at about noon which may be fasting also  Blood sugars after evening meals and at bedtime are inconsistent and not checked for the last 2 weeks  However she has had readings over 200 regularly also possibly at home not taking her NovoLog but she does not remember why  Has been minimally active because of her various problems including back pain  Because of decreased appetite her weight has come down recently but not since her last  visit  She wants to start using the freestyle libre sensor for convenience and more frequent monitoring     Side effects from medications:  none  Monitors blood glucose: Once a day or less on average .    Glucometer:  Accu-Chek .          Blood Glucose readings as follows  Recent FASTING readings range from 118-122 and nonfasting 91-193    Meals: 1-2 meals per day. Lunch Supper at 7 PM pm           Physical activity: exercise: Limited, has had fatigue, back and leg pain.            Dietician visit: Most recent: 02/6386            Complications: are: None  Wt Readings from Last 3 Encounters:  04/10/18 280 lb 6.4 oz (127.2 kg)  04/05/18 268 lb (121.6 kg)  03/24/18 291 lb (132 kg)    LABS:  Lab Results  Component Value Date   HGBA1C 5.7 (A) 04/10/2018   HGBA1C 6.1 12/31/2017   HGBA1C 7.9 (H) 08/07/2017   Lab Results  Component Value Date   MICROALBUR 2.0 (H) 12/31/2017   LDLCALC 114 (H) 12/31/2017   CREATININE 1.53 (H) 04/05/2018    Other active problems: See review of systems   Office Visit on 04/10/2018  Component Date Value Ref Range Status  . Hemoglobin A1C 04/10/2018 5.7* 4.0 - 5.6 % Final  . POC Glucose 04/10/2018 213* 70 - 99 mg/dl Final  Admission on 04/05/2018, Discharged on 04/05/2018  Component Date Value Ref Range Status  . Lipase 04/05/2018 31  11 - 51 U/L Final   Performed at [facility], [place] [address], [place], [place] [address]  . Sodium 04/05/2018 138  135 - 145 mmol/L Final  . Potassium 04/05/2018 4.3  3.5 - 5.1 mmol/L Final  . Chloride 04/05/2018 107  98 - 111 mmol/L Final   Please note change in reference range.  . CO2 04/05/2018 22  22 - 32 mmol/L Final  . Glucose, Bld 04/05/2018 196* 70 - 99 mg/dL Final   Please note change in reference range.  . BUN 04/05/2018 16  8 - 23 mg/dL Final   Please note change in reference range.  . Creatinine, Ser 04/05/2018 1.53* 0.44 - 1.00 mg/dL Final  . Calcium 04/05/2018 9.2  8.9 -  10.3 mg/dL Final  . Total Protein 04/05/2018 7.6  6.5 - 8.1 g/dL Final  . Albumin 04/05/2018 3.8  3.5 - 5.0 g/dL Final  . AST 04/05/2018 14* 15 - 41 U/L Final  . ALT 04/05/2018 16  0 - 44 U/L Final   Please note change in reference range.  . Alkaline Phosphatase 04/05/2018 86  38 - 126 U/L Final  . Total Bilirubin 04/05/2018 0.5  0.3 - 1.2 mg/dL Final  . GFR calc non Af Amer 04/05/2018 34* >60 mL/min Final  . GFR calc Af Amer 04/05/2018 39* >60 mL/min Final   Comment: (NOTE) The eGFR has been calculated using the CKD EPI equation. This calculation has not been validated in all clinical situations. eGFR's persistently <60 mL/min signify possible Chronic Kidney Disease.   Georgiann Hahn gap 04/05/2018 9  5 - 15 Final   Performed at [facility], [place] [address], [place], [place] [address]  . WBC 04/05/2018 5.9  4.0 - 10.5 K/uL Final  . RBC 04/05/2018 3.73* 3.87 - 5.11 MIL/uL Final  . Hemoglobin 04/05/2018 11.7* 12.0 - 15.0 g/dL Final  . HCT 04/05/2018 37.7  36.0 - 46.0 % Final  . MCV 04/05/2018 101.1* 78.0 - 100.0 fL Final  . MCH 04/05/2018 31.4  26.0 - 34.0 pg Final  . MCHC 04/05/2018 31.0  30.0 - 36.0 g/dL Final  . RDW 04/05/2018 13.2  11.5 - 15.5 % Final  . Platelets 04/05/2018 280  150 - 400 K/uL Final   Performed at [facility], [place] [address] [place], [place] [address]  . Color, Urine 04/05/2018 YELLOW  YELLOW Final  . APPearance 04/05/2018 CLEAR  CLEAR Final  . Specific Gravity, Urine 04/05/2018 >1.046* 1.005 - 1.030 Final  . pH 04/05/2018 6.0  5.0 - 8.0 Final  . Glucose, UA 04/05/2018 NEGATIVE  NEGATIVE mg/dL Final  . Hgb urine dipstick 04/05/2018 NEGATIVE  NEGATIVE Final  . Bilirubin Urine 04/05/2018 NEGATIVE  NEGATIVE Final  . Ketones, ur 04/05/2018 NEGATIVE  NEGATIVE mg/dL Final  . Protein, ur 04/05/2018 NEGATIVE  NEGATIVE mg/dL Final  . Nitrite 04/05/2018 POSITIVE* NEGATIVE Final  . Leukocytes, UA 04/05/2018 NEGATIVE  NEGATIVE Final  .  RBC / HPF 04/05/2018 0-5  0 - 5 RBC/hpf Final  . WBC, UA 04/05/2018 0-5  0 - 5 WBC/hpf Final  . Bacteria, UA 04/05/2018 MANY* NONE SEEN Final  . Squamous Epithelial / LPF 04/05/2018 0-5  0 - 5 Final  . Mucus 04/05/2018 PRESENT   Final   Performed at [facility], [place] [address], [place], [place] [address]  . Specimen Description  04/05/2018    Final                   Value:URINE, CLEAN CATCH Performed at [facility], [place] [address] [place], [place] [address]   . Special Requests 04/05/2018    Final                   Value:NONE Performed at [facility], [place] [address], [place], [place] [address]   . Culture 04/05/2018 >=100,000 COLONIES/mL KLEBSIELLA PNEUMONIAE*  Final  . Report Status 04/05/2018 04/07/2018 FINAL   Final  . Organism ID, Bacteria 04/05/2018 KLEBSIELLA PNEUMONIAE*  Final    Allergies as of 04/10/2018      Reactions   Hydrocodone Other (See Comments)   Tachycardia, SOB   Ciprofloxacin Other (See Comments)   Dizziness, SOB   Penicillins Hives   Pt just remembers hives Has patient had a PCN reaction causing immediate rash, facial/tongue/throat swelling, SOB or lightheadedness with hypotension: No Has patient had a PCN reaction causing severe rash involving mucus membranes or skin necrosis: No Has patient had a PCN reaction that required hospitalization No Has patient had a PCN reaction occurring within the last 10 years: No If all of the above answers are "NO", then may proceed with Cephalosporin use.      Medication List        Accurate as of 04/10/18 11:59 PM. Always use your most recent med list.          acetaminophen-codeine 300-30 MG tablet Commonly known as:  TYLENOL #3 Take 3 tablets by mouth every 8 (eight) hours as needed for moderate pain.   aspirin 81 MG EC tablet Take 81 mg by mouth every evening.   atorvastatin 40 MG tablet Commonly known as:  LIPITOR Take 40 mg by mouth daily.     buPROPion 150 MG 24 hr tablet Commonly known as:  WELLBUTRIN XL Take 1 tablet (150 mg total) by mouth daily.   cephALEXin 500 MG capsule Commonly known as:  KEFLEX Take 1 capsule (500 mg total) by mouth 4 (four) times daily.   cyclobenzaprine 10 MG tablet Commonly known as:  FLEXERIL Take 10 mg by mouth 2 (two) times daily as needed for muscle spasms.   ELMIRON 100 MG capsule Generic drug:  pentosan polysulfate Take 100 mg by mouth 2 (two) times daily.   estradiol 0.1 mg/24hr patch Commonly known as:  CLIMARA - Dosed in mg/24 hr Place 1 patch (0.1 mg total) onto the skin once a week.   FLUoxetine 20 MG capsule Commonly known as:  PROZAC TAKE 99m BY MOUTH DAILY   gabapentin 100 MG capsule Commonly known as:  NEURONTIN Take 2 capsules (200 mg total) by mouth at bedtime.   glucose blood test strip Commonly known as:  ACCU-CHEK AVIVA PLUS USE TO TEST BLOOD SUGAR 4 TO 6 TIMES DAILY DX CODE E11.65   Insulin Glargine-Lixisenatide 100-33 UNT-MCG/ML Sopn Commonly known as:  SOLIQUA Inject 34 Units into the skin daily with breakfast.   levETIRAcetam 250 MG tablet Commonly known as:  KEPPRA Take 250-500 mg by mouth 2 (two) times daily. Dose depends on level of pain.   levothyroxine 125 MCG tablet Commonly known as:  SYNTHROID, LEVOTHROID Take 1 tablet (125 mcg total) by mouth daily.   losartan 100 MG tablet Commonly known as:  COZAAR Take 1 tablet (100 mg total) by mouth daily.   naltrexone 50 MG tablet Commonly known as:  DEPADE Take  1 tablet (50 mg total) by mouth daily.   NOVOLOG FLEXPEN 100 UNIT/ML FlexPen Generic drug:  insulin aspart Inject 10 Units into the skin 3 (three) times daily with meals.   ondansetron 4 MG disintegrating tablet Commonly known as:  ZOFRAN ODT Take 1 tablet (4 mg total) by mouth every 8 (eight) hours as needed.   oxyCODONE-acetaminophen 5-325 MG tablet Commonly known as:  PERCOCET/ROXICET Take 1 tablet by mouth every 4 (four) hours  as needed for severe pain.   phenazopyridine 200 MG tablet Commonly known as:  PYRIDIUM Take 1 tablet (200 mg total) by mouth 3 (three) times daily.   progesterone 100 MG capsule Commonly known as:  PROMETRIUM Take 1 capsule (100 mg total) by mouth at bedtime.   sulfamethoxazole-trimethoprim 800-160 MG tablet Commonly known as:  BACTRIM DS,SEPTRA DS Take 1 tablet by mouth 2 (two) times daily.   torsemide 20 MG tablet Commonly known as:  DEMADEX Take 20 mg by mouth daily.       Allergies:  Allergies  Allergen Reactions  . Hydrocodone Other (See Comments)    Tachycardia, SOB   . Ciprofloxacin Other (See Comments)    Dizziness, SOB   . Penicillins Hives    Pt just remembers hives Has patient had a PCN reaction causing immediate rash, facial/tongue/throat swelling, SOB or lightheadedness with hypotension: No Has patient had a PCN reaction causing severe rash involving mucus membranes or skin necrosis: No Has patient had a PCN reaction that required hospitalization No Has patient had a PCN reaction occurring within the last 10 years: No If all of the above answers are "NO", then may proceed with Cephalosporin use.     Past Medical History:  Diagnosis Date  . Anxiety   . Arthritis   . Blood dyscrasia    "FREE BLEEDER"  . CERVICAL RADICULOPATHY, LEFT   . Chronic back pain   . CKD (chronic kidney disease)    DR. SANFORD  [facility]  . COMMON MIGRAINE   . CORONARY ARTERY DISEASE   . Cough    CURRENT COLD  . Cystitis   . DEPRESSION   . Diabetes mellitus, type II ([place])   . DIVERTICULOSIS-COLON   . Gastric ulcer 04/2008  . Gastroparesis   . GERD (gastroesophageal reflux disease)   . Hiatal hernia   . Hyperlipidemia   . Hypertension   . Hypothyroidism   . INSOMNIA-SLEEP DISORDER-UNSPEC   . Iron deficiency anemia   . Wears glasses     Past Surgical History:  Procedure Laterality Date  . ABDOMINAL HYSTERECTOMY    . ANTERIOR LAT LUMBAR FUSION Left  08/13/2017   Procedure: LEFT SIDED LUMBAR 3-4 LATERAL INTERBODY FUSION WITH INSTRUMENTATION AND ALLOGRAFT;  Surgeon: Phylliss Bob, MD;  Location: [facility];  Service: Orthopedics;  Laterality: Left;  LEFT SIDED LUMBAR 3-4 LATERAL INTERBODY FUSION WITH INSTRUMENTATION AND ALLOGRAFT; REQUEST 3 HOURS  . BACK SURGERY  03/2016  . CHOLECYSTECTOMY  06/2009  . COLONOSCOPY    . ESOPHAGOGASTRODUODENOSCOPY    . EYE SURGERY Bilateral    lasik  . Gastric Wedge resection lipoma  11/2007   x2 with laparotomy and gastrostomy  . Left knee replacement    . Rigth knee replacement with revision  04/2008   Dr. Berenice Primas  . ROTATOR CUFF REPAIR Left 01/2009  . s/p bladder surgury  09/2009   Dr. Terance Hart    Family History  Problem Relation Age of Onset  . Diabetes Sister  x 3  . Heart disease Sister        x2  . Diabetes Brother        x3  . Heart disease Brother        x2  . Coronary artery disease Other        female 1st degree  . Hypertension Other   . Colon cancer Neg Hx     Social History:  reports that she has quit smoking. She has never used smokeless tobacco. She reports that she does not drink alcohol or use drugs.  Review of Systems:  Hypertension: Her blood pressure is treated with only, losartan currently and followed by nephrologist  UTI: She has had UTI and is currently taking cephalexin after the visit from the emergency room for Klebsiella infection  She is asking about decreased appetite and nausea that is persisting  CKD: Her creatinine is variable, followed by nephrologist also now However overall has been relatively stable this year   Lab Results  Component Value Date   CREATININE 1.53 (H) 04/05/2018   CREATININE 1.43 (H) 02/02/2018   CREATININE 1.67 (H) 12/31/2017   CREATININE 1.39 (H) 08/07/2017     Lipids: Followed by PCP, LDL has been inconsistently controlled  Taking Lipitor 40 mg,  lipids are not consistently controlled because of variable compliance  Lab  Results  Component Value Date   CHOL 188 12/31/2017   HDL 59.40 12/31/2017   LDLCALC 114 (H) 12/31/2017   LDLDIRECT 156.6 01/07/2011   TRIG 75.0 12/31/2017   CHOLHDL 3 12/31/2017     Post ablative hypothyroidism:  Previously she had I-131 treatment on 08/20/11 for Graves' disease  Her dose has been adjusted periodically Since her TSH was markedly increased in 3/19 her dose was increased up to 125 mcg She takes this daily before breakfast  Still compliant with her levothyroxine daily TSH is mildly suppressed and she is being instructed to take 6-1/2 tablets a week  Lab Results  Component Value Date   TSH 0.26 (L) 02/02/2018   TSH 10.81 (H) 12/31/2017   TSH 1.510 06/24/2017   FREET4 0.53 (L) 12/31/2017   FREET4 0.64 05/13/2017   FREET4 0.64 05/02/2017    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     ASSESSMENT/ PLAN:   Diabetes type 2, uncontrolled with obesity:   See history of present illness for detailed discussion of current management, blood sugar patterns and problems identified  Although her A1c is better at 5.7 she is quite inconsistent with her diabetes management including glucose monitoring, diet, taking her medications and insulin consistently This is causing variability in her blood sugars and despite her A1c being low her glucose is averaging 149 at home with mostly postprandial hyperglycemia However still benefiting from taking [place] along with covering her main meal at dinnertime with NovoLog Her intake and carbohydrate content of the meals is variable as also her mealtimes   Recommendations:  Discussed needing to check blood sugars consistently and more often when she is having intercurrent  problems  Discussed that despite her not eating much her sugars are over 200 mostly as judged by her recent labs and  office reading today and this is related to insulin deficiency as well as stress  She will at least need to start back on Soliqua 15 units now and adjust this based on fasting readings as discussed  When she starts having regular meals she will need to start taking NovoLog at suppertime as before  She has the freestyle libre sensor available and she was instructed in the office on how to use this and get this started  Hypothyroidism:  Needs to have labs rechecked, dose was reduced her dose by half tablet weekly on her last visit  LIPIDS: Needs follow-up levels on the next visit, also needs to be consistent with her Lipitor  HYPERTENSION: Controlled   Decreased appetite and nausea: She needs to call her PCP regarding these symptoms  Counseling time on subjects discussed in assessment and plan sections is over 50% of today's 25 minute visit   Patient Instructions  Check blood sugars on waking up  3/7  Also check blood sugars about 2 hours after a meal and do this after different meals by rotation  Recommended blood sugar levels on waking up is 90-130 and about 2 hours after meal is 130-160  Please bring your blood sugar monitor to each visit, thank you  Soliqua 15 now and keep up with am sugar levels       Elayne Snare 04/11/2018, 10:35 AM   Note: This office note was prepared with Dragon voice recognition system technology. Any transcriptional errors that result from this process are unintentional.

## 2018-04-11 ENCOUNTER — Ambulatory Visit (INDEPENDENT_AMBULATORY_CARE_PROVIDER_SITE_OTHER): Payer: Medicare Other | Admitting: Family Medicine

## 2018-04-11 ENCOUNTER — Other Ambulatory Visit (HOSPITAL_COMMUNITY)
Admission: RE | Admit: 2018-04-11 | Discharge: 2018-04-11 | Disposition: A | Discharge status: Home / Self Care | Payer: Medicare Other | Source: Other Acute Inpatient Hospital | Attending: Family Medicine | Admitting: Family Medicine

## 2018-04-11 ENCOUNTER — Encounter: Payer: Self-pay | Admitting: Family Medicine

## 2018-04-11 ENCOUNTER — Telehealth: Payer: Self-pay | Admitting: Internal Medicine

## 2018-04-11 ENCOUNTER — Other Ambulatory Visit: Payer: Self-pay

## 2018-04-11 VITALS — BP 122/82 | HR 83 | Temp 98.8°F | Resp 16 | Ht 66.0 in | Wt 279.0 lb

## 2018-04-11 DIAGNOSIS — R112 Nausea with vomiting, unspecified: Secondary | ICD-10-CM | POA: Diagnosis not present

## 2018-04-11 DIAGNOSIS — N390 Urinary tract infection, site not specified: Secondary | ICD-10-CM

## 2018-04-11 DIAGNOSIS — I10 Essential (primary) hypertension: Secondary | ICD-10-CM

## 2018-04-11 DIAGNOSIS — R109 Unspecified abdominal pain: Secondary | ICD-10-CM

## 2018-04-11 LAB — POCT URINALYSIS DIPSTICK
Bilirubin, UA: NEGATIVE
Blood, UA: NEGATIVE
Glucose, UA: NEGATIVE
Ketones, UA: NEGATIVE
Leukocytes, UA: NEGATIVE
Nitrite, UA: NEGATIVE
Protein, UA: NEGATIVE
Spec Grav, UA: >=1.03 — AB (ref 1.010–1.025)
Urobilinogen, UA: 0.2 E.U./dL
pH, UA: 6 (ref 5.0–8.0)

## 2018-04-11 MED ORDER — PROMETHAZINE HCL 25 MG PO TABS: 25.0000 mg | ORAL_TABLET | Freq: Three times a day (TID) | ORAL | 0 refills | Status: DC | PRN

## 2018-04-11 MED ORDER — PHENAZOPYRIDINE HCL 200 MG PO TABS: 200.0000 mg | ORAL_TABLET | Freq: Three times a day (TID) | ORAL | 0 refills | Status: DC

## 2018-04-11 MED ORDER — CEFTRIAXONE SODIUM 500 MG IJ SOLR
500.0000 mg | Freq: Once | INTRAMUSCULAR | Status: AC
  Administered 2018-04-11: 500 mg via INTRAMUSCULAR

## 2018-04-11 MED ORDER — ONDANSETRON HCL 4 MG/2ML IJ SOLN
4.0000 mg | Freq: Once | INTRAMUSCULAR | Status: AC
  Administered 2018-04-11: 4 mg via INTRAMUSCULAR

## 2018-04-11 MED ORDER — CEFDINIR 300 MG PO CAPS: 300.0000 mg | ORAL_CAPSULE | Freq: Two times a day (BID) | ORAL | 0 refills | Status: AC

## 2018-04-11 NOTE — Assessment & Plan Note (Signed)
recently treated with antibiotics for UTI and now having urinary frequency and dysuria again. Urine dip shows nitrites. Is given a dose of Rocephin 500 mg IM in office then start Cefdinir 300 mg po bid and due to nausea she has been unable to keep meds down so given a shot of Zofran 4 mg IM in office and a prescription for Phenergan 25 mg tab po tid prn nausea. She is also noting constipation which is new. She usually has a BM every other day and now has gone nearly 2 weeks without a BM. She is asked to clean out her bowels. Encouraged increased hydration and fiber in diet. Daily probiotics. If bowels not moving can use MOM 2 tbls po in 4 oz of warm prune juice by mouth every 2-3 days. If no results then repeat in 4 hours with  Dulcolax suppository pr, may repeat again in 4 more hours as needed. Seek care if symptoms worsen. Can also try an enema. Her visit lasts 45 minutes and many options are discussed. She is given the option to go to ER for evaluation but she chooses to proceed with outpatient treatment for now. If she worsens she will agree to go to ER

## 2018-04-11 NOTE — Assessment & Plan Note (Signed)
Well controlled, no changes to meds. Encouraged heart healthy diet such as the DASH diet and exercise as tolerated.  °

## 2018-04-11 NOTE — Progress Notes (Signed)
Subjective:    Patient ID: [name], female    DOB: 1950-12-12, 67 y.o.   MRN: [id number]  Chief Complaint  Patient presents with  . Emesis    HPI Patient is in today for evaluation of abdominal pain with nausea and vomiting. recently treated with antibiotics for UTI and now having urinary frequency and dysuria again. Urine dip shows nitrites. She is also noting constipation which is new. She usually has a BM every other day and now has gone nearly 2 weeks without a BM. She endorses some intermittent low grade chills, possible subjective fevers, flank pain on left. Also endorses anorexia and malaise. Denies CP/palp/SOB/HA/congestion. Taking meds as prescribed  Past Medical History:  Diagnosis Date  . Anxiety   . Arthritis   . Blood dyscrasia    "FREE BLEEDER"  . CERVICAL RADICULOPATHY, LEFT   . Chronic back pain   . CKD (chronic kidney disease)    DR. SANFORD  [facility]  . COMMON MIGRAINE   . CORONARY ARTERY DISEASE   . Cough    CURRENT COLD  . Cystitis   . DEPRESSION   . Diabetes mellitus, type II ([place])   . DIVERTICULOSIS-COLON   . Gastric ulcer 04/2008  . Gastroparesis   . GERD (gastroesophageal reflux disease)   . Hiatal hernia   . Hyperlipidemia   . Hypertension   . Hypothyroidism   . INSOMNIA-SLEEP DISORDER-UNSPEC   . Iron deficiency anemia   . Wears glasses     Past Surgical History:  Procedure Laterality Date  . ABDOMINAL HYSTERECTOMY    . ANTERIOR LAT LUMBAR FUSION Left 08/13/2017   Procedure: LEFT SIDED LUMBAR 3-4 LATERAL INTERBODY FUSION WITH INSTRUMENTATION AND ALLOGRAFT;  Surgeon: Phylliss Bob, MD;  Location: [facility];  Service: Orthopedics;  Laterality: Left;  LEFT SIDED LUMBAR 3-4 LATERAL INTERBODY FUSION WITH INSTRUMENTATION AND ALLOGRAFT; REQUEST 3 HOURS  . BACK SURGERY  03/2016  . CHOLECYSTECTOMY  06/2009  . COLONOSCOPY    . ESOPHAGOGASTRODUODENOSCOPY    . EYE SURGERY Bilateral    lasik  . Gastric Wedge resection lipoma  11/2007   x2  with laparotomy and gastrostomy  . Left knee replacement    . Rigth knee replacement with revision  04/2008   Dr. Berenice Primas  . ROTATOR CUFF REPAIR Left 01/2009  . s/p bladder surgury  09/2009   Dr. Terance Hart    Family History  Problem Relation Age of Onset  . Diabetes Sister        x 3  . Heart disease Sister        x2  . Diabetes Brother        x3  . Heart disease Brother        x2  . Coronary artery disease Other        female 1st degree  . Hypertension Other   . Colon cancer Neg Hx     Social History   Socioeconomic History  . Marital status: Married    Spouse name: Not on file  . Number of children: Not on file  . Years of education: Not on file  . Highest education level: Not on file  Occupational History  . Occupation: disabled  Social Needs  . Financial resource strain: Not on file  . Food insecurity:    Worry: Not on file    Inability: Not on file  . Transportation needs:    Medical: Not on file    Non-medical: Not on file  Tobacco  Use  . Smoking status: Former Smoker  . Smokeless tobacco: Never Used  . Tobacco comment: quit 30 years ago  Substance and Sexual Activity  . Alcohol use: No    Alcohol/week: 0.0 oz  . Drug use: No  . Sexual activity: Not Currently    Birth control/protection: None  Lifestyle  . Physical activity:    Days per week: Not on file    Minutes per session: Not on file  . Stress: Not on file  Relationships  . Social connections:    Talks on phone: Not on file    Gets together: Not on file    Attends religious service: Not on file    Active member of club or organization: Not on file    Attends meetings of clubs or organizations: Not on file    Relationship status: Not on file  . Intimate partner violence:    Fear of current or ex partner: Not on file    Emotionally abused: Not on file    Physically abused: Not on file    Forced sexual activity: Not on file  Other Topics Concern  . Not on file  Social History Narrative    Patient gets regular exercise    Outpatient Medications Prior to Visit  Medication Sig Dispense Refill  . acetaminophen-codeine (TYLENOL #3) 300-30 MG tablet Take 3 tablets by mouth every 8 (eight) hours as needed for moderate pain.     Marland Kitchen aspirin 81 MG EC tablet Take 81 mg by mouth every evening.     Marland Kitchen atorvastatin (LIPITOR) 40 MG tablet Take 40 mg by mouth daily.    Marland Kitchen buPROPion (WELLBUTRIN XL) 150 MG 24 hr tablet Take 1 tablet (150 mg total) by mouth daily. 90 tablet 3  . cephALEXin (KEFLEX) 500 MG capsule Take 1 capsule (500 mg total) by mouth 4 (four) times daily. 28 capsule 0  . cyclobenzaprine (FLEXERIL) 10 MG tablet Take 10 mg by mouth 2 (two) times daily as needed for muscle spasms.    Marland Kitchen ELMIRON 100 MG capsule Take 100 mg by mouth 2 (two) times daily.     Marland Kitchen estradiol (CLIMARA - DOSED IN MG/24 HR) 0.1 mg/24hr patch Place 1 patch (0.1 mg total) onto the skin once a week. 12 patch 4  . FLUoxetine (PROZAC) 20 MG capsule TAKE 20mg  BY MOUTH DAILY 90 capsule 3  . gabapentin (NEURONTIN) 100 MG capsule Take 2 capsules (200 mg total) by mouth at bedtime. 60 capsule 3  . glucose blood (ACCU-CHEK AVIVA PLUS) test strip USE TO TEST BLOOD SUGAR 4 TO 6 TIMES DAILY DX CODE E11.65 450 each 2  . insulin aspart (NOVOLOG FLEXPEN) 100 UNIT/ML FlexPen Inject 10 Units into the skin 3 (three) times daily with meals.    . Insulin Glargine-Lixisenatide (SOLIQUA) 100-33 UNT-MCG/ML SOPN Inject 34 Units into the skin daily with breakfast. 3 pen 3  . levETIRAcetam (KEPPRA) 250 MG tablet Take 250-500 mg by mouth 2 (two) times daily. Dose depends on level of pain.    Marland Kitchen levothyroxine (SYNTHROID, LEVOTHROID) 125 MCG tablet Take 1 tablet (125 mcg total) by mouth daily. 90 tablet 3  . losartan (COZAAR) 100 MG tablet Take 1 tablet (100 mg total) by mouth daily. 90 tablet 3  . naltrexone (DEPADE) 50 MG tablet Take 1 tablet (50 mg total) by mouth daily. 90 tablet 3  . ondansetron (ZOFRAN ODT) 4 MG disintegrating tablet Take 1  tablet (4 mg total) by mouth every 8 (eight) hours as  needed. 10 tablet 0  . oxyCODONE-acetaminophen (PERCOCET/ROXICET) 5-325 MG tablet Take 1 tablet by mouth every 4 (four) hours as needed for severe pain. 15 tablet 0  . progesterone (PROMETRIUM) 100 MG capsule Take 1 capsule (100 mg total) by mouth at bedtime. 90 capsule 4  . torsemide (DEMADEX) 20 MG tablet Take 20 mg by mouth daily.    . phenazopyridine (PYRIDIUM) 200 MG tablet Take 1 tablet (200 mg total) by mouth 3 (three) times daily. 6 tablet 0  . sulfamethoxazole-trimethoprim (BACTRIM DS,SEPTRA DS) 800-160 MG tablet Take 1 tablet by mouth 2 (two) times daily. 20 tablet 0   No facility-administered medications prior to visit.     Allergies  Allergen Reactions  . Hydrocodone Other (See Comments)    Tachycardia, SOB   . Ciprofloxacin Other (See Comments)    Dizziness, SOB   . Penicillins Hives    Pt just remembers hives Has patient had a PCN reaction causing immediate rash, facial/tongue/throat swelling, SOB or lightheadedness with hypotension: No Has patient had a PCN reaction causing severe rash involving mucus membranes or skin necrosis: No Has patient had a PCN reaction that required hospitalization No Has patient had a PCN reaction occurring within the last 10 years: No If all of the above answers are "NO", then may proceed with Cephalosporin use.     Review of Systems  Constitutional: Positive for chills, fever and malaise/fatigue.  HENT: Negative for congestion.   Eyes: Negative for blurred vision.  Respiratory: Negative for shortness of breath.   Cardiovascular: Negative for chest pain, palpitations and leg swelling.  Gastrointestinal: Positive for abdominal pain, constipation, nausea and vomiting. Negative for blood in stool, diarrhea and melena.  Genitourinary: Positive for dysuria, frequency and urgency. Negative for hematuria.  Musculoskeletal: Positive for myalgias. Negative for falls.  Skin: Negative for rash.   Neurological: Negative for dizziness, loss of consciousness and headaches.  Endo/Heme/Allergies: Negative for environmental allergies.  Psychiatric/Behavioral: Negative for depression. The patient is not nervous/anxious.        Objective:    Physical Exam  Constitutional: She is oriented to person, place, and time. She appears well-developed and well-nourished. No distress.  HENT:  Head: Normocephalic and atraumatic.  Nose: Nose normal.  Eyes: Right eye exhibits no discharge. Left eye exhibits no discharge.  Neck: Normal range of motion. Neck supple.  Cardiovascular: Normal rate and regular rhythm.  No murmur heard. Pulmonary/Chest: Effort normal and breath sounds normal.  Abdominal: Soft. Bowel sounds are normal. She exhibits no distension and no mass. There is tenderness. There is no guarding. No hernia.  Musculoskeletal: She exhibits no edema.  Neurological: She is alert and oriented to person, place, and time.  Skin: Skin is warm and dry.  Psychiatric: She has a normal mood and affect.  Nursing note and vitals reviewed.   BP 122/82   Pulse 83   Temp 98.8 F (37.1 C) (Oral)   Resp 16   Ht 5\' 6"  (1.676 m)   Wt 279 lb (126.6 kg)   SpO2 98%   BMI 45.03 kg/m  Wt Readings from Last 3 Encounters:  04/11/18 279 lb (126.6 kg)  04/10/18 280 lb 6.4 oz (127.2 kg)  04/05/18 268 lb (121.6 kg)     Lab Results  Component Value Date   WBC 5.9 04/05/2018   HGB 11.7 (L) 04/05/2018   HCT 37.7 04/05/2018   PLT 280 04/05/2018   GLUCOSE 196 (H) 04/05/2018   CHOL 188 12/31/2017   TRIG 75.0 12/31/2017  HDL 59.40 12/31/2017   LDLDIRECT 156.6 01/07/2011   LDLCALC 114 (H) 12/31/2017   ALT 16 04/05/2018   AST 14 (L) 04/05/2018   NA 138 04/05/2018   K 4.3 04/05/2018   CL 107 04/05/2018   CREATININE 1.53 (H) 04/05/2018   BUN 16 04/05/2018   CO2 22 04/05/2018   TSH 0.26 (L) 02/02/2018   INR 1.21 08/07/2017   HGBA1C 5.7 (A) 04/10/2018   MICROALBUR 2.0 (H) 12/31/2017    Lab  Results  Component Value Date   TSH 0.26 (L) 02/02/2018   Lab Results  Component Value Date   WBC 5.9 04/05/2018   HGB 11.7 (L) 04/05/2018   HCT 37.7 04/05/2018   MCV 101.1 (H) 04/05/2018   PLT 280 04/05/2018   Lab Results  Component Value Date   NA 138 04/05/2018   K 4.3 04/05/2018   CO2 22 04/05/2018   GLUCOSE 196 (H) 04/05/2018   BUN 16 04/05/2018   CREATININE 1.53 (H) 04/05/2018   BILITOT 0.5 04/05/2018   ALKPHOS 86 04/05/2018   AST 14 (L) 04/05/2018   ALT 16 04/05/2018   PROT 7.6 04/05/2018   ALBUMIN 3.8 04/05/2018   CALCIUM 9.2 04/05/2018   ANIONGAP 9 04/05/2018   GFR 46.94 (L) 02/02/2018   Lab Results  Component Value Date   CHOL 188 12/31/2017   Lab Results  Component Value Date   HDL 59.40 12/31/2017   Lab Results  Component Value Date   LDLCALC 114 (H) 12/31/2017   Lab Results  Component Value Date   TRIG 75.0 12/31/2017   Lab Results  Component Value Date   CHOLHDL 3 12/31/2017   Lab Results  Component Value Date   HGBA1C 5.7 (A) 04/10/2018       Assessment & Plan:   Problem List Items Addressed This Visit    Essential hypertension    Well controlled, no changes to meds. Encouraged heart healthy diet such as the DASH diet and exercise as tolerated.       Abdominal discomfort    recently treated with antibiotics for UTI and now having urinary frequency and dysuria again. Urine dip shows nitrites. Is given a dose of Rocephin 500 mg IM in office then start Cefdinir 300 mg po bid and due to nausea she has been unable to keep meds down so given a shot of Zofran 4 mg IM in office and a prescription for Phenergan 25 mg tab po tid prn nausea. She is also noting constipation which is new. She usually has a BM every other day and now has gone nearly 2 weeks without a BM. She is asked to clean out her bowels. Encouraged increased hydration and fiber in diet. Daily probiotics. If bowels not moving can use MOM 2 tbls po in 4 oz of warm prune juice by  mouth every 2-3 days. If no results then repeat in 4 hours with  Dulcolax suppository pr, may repeat again in 4 more hours as needed. Seek care if symptoms worsen. Can also try an enema. Her visit lasts 45 minutes and many options are discussed. She is given the option to go to ER for evaluation but she chooses to proceed with outpatient treatment for now. If she worsens she will agree to go to ER         Other Visit Diagnoses    Urinary tract infection without hematuria, site unspecified    -  Primary   Relevant Medications   phenazopyridine (PYRIDIUM) 200 MG tablet  cefTRIAXone (ROCEPHIN) injection 500 mg (Completed)   ondansetron (ZOFRAN) injection 4 mg (Completed)   cefdinir (OMNICEF) 300 MG capsule   promethazine (PHENERGAN) 25 MG tablet   Other Relevant Orders   Urine Culture   POCT Urinalysis Dipstick (Completed)   Nausea and vomiting, intractability of vomiting not specified, unspecified vomiting type       Relevant Medications   phenazopyridine (PYRIDIUM) 200 MG tablet   cefTRIAXone (ROCEPHIN) injection 500 mg (Completed)   ondansetron (ZOFRAN) injection 4 mg (Completed)   promethazine (PHENERGAN) 25 MG tablet      I have discontinued [name]'s sulfamethoxazole-trimethoprim. I am also having her start on cefdinir and promethazine. Additionally, I am having her maintain her aspirin, ELMIRON, insulin aspart, levETIRAcetam, progesterone, estradiol, torsemide, FLUoxetine, losartan, acetaminophen-codeine, glucose blood, levothyroxine, Insulin Glargine-Lixisenatide, atorvastatin, naltrexone, buPROPion, gabapentin, cyclobenzaprine, oxyCODONE-acetaminophen, cephALEXin, ondansetron, and phenazopyridine. We administered cefTRIAXone and ondansetron.  Meds ordered this encounter  Medications  . phenazopyridine (PYRIDIUM) 200 MG tablet    Sig: Take 1 tablet (200 mg total) by mouth 3 (three) times daily.    Dispense:  6 tablet    Refill:  0  . cefTRIAXone (ROCEPHIN) injection 500  mg  . ondansetron (ZOFRAN) injection 4 mg  . cefdinir (OMNICEF) 300 MG capsule    Sig: Take 1 capsule (300 mg total) by mouth 2 (two) times daily for 10 days.    Dispense:  20 capsule    Refill:  0  . promethazine (PHENERGAN) 25 MG tablet    Sig: Take 1 tablet (25 mg total) by mouth every 8 (eight) hours as needed for nausea or vomiting.    Dispense:  20 tablet    Refill:  0     Penni Homans, MD

## 2018-04-11 NOTE — Telephone Encounter (Signed)
PER [facility]  Chief complaint: abdominal pain- severe and only in abdomen  Initial comment: caller states she has had nausea and vomiting x2 weeks or more and unable to even keep water down. Having pain in lower abdomen going to back. Has been to ER but still not better. Requesting appointment. She went to the ED last Sunday, dx with Interstital cystitis but she is still no better. She saw Dr. Judi Cong pryor to the ED and was treated for a UTI. She is currently taking Cephalexin, has Zofran and Oxycodone.   Nurse: Julien Girt, RN, Almyra Free  Does the patient have any new or worsening symptoms? YES  Does the patient have any chronic conditions? YES Interstitis/UTI/ diabetes  Affirmed question: 1 SEVERE pain (eg excruciating) and 2 no improvement 2 hours after pain medication   Disposition: See HCP within 4 hours (or PCP triage)   Disagree/comply: comply  Care advice: See HCP within 4 hours. If office will be open: you need to be seen within the next 3 or 4 hours. Call your doctor now or as soon as the office opens. You become worse. Advice given per urinary tract infection on antibiotic follow up call, female (adult) guideline  Comments: Provided pt number for [facility] Saturday clinic, she will call at 9 and cb as needed.

## 2018-04-11 NOTE — Patient Instructions (Signed)
Encouraged increased hydration and fiber in diet. Daily probiotics. If bowels not moving can use MOM 2 tbls po in 4 oz of warm prune juice by mouth every 2-3 days. If no results then repeat in 4 hours with  Dulcolax suppository pr, may repeat again in 4 more hours as needed. Seek care if symptoms worsen. Consider daily Miralax and/or Dulcolax if symptoms persist.   If above not effective consider enema or present to ER especially if pain worsens    Constipation, Adult Constipation is when a person:  Poops (has a bowel movement) fewer times in a week than normal.  Has a hard time pooping.  Has poop that is dry, hard, or bigger than normal.  Follow these instructions at home: Eating and drinking   Eat foods that have a lot of fiber, such as: ? Fresh fruits and vegetables. ? Whole grains. ? Beans.  Eat less of foods that are high in fat, low in fiber, or overly processed, such as: ? [place] fries. ? Hamburgers. ? Cookies. ? Candy. ? Soda.  Drink enough fluid to keep your pee (urine) clear or pale yellow. General instructions  Exercise regularly or as told by your doctor.  Go to the restroom when you feel like you need to poop. Do not hold it in.  Take over-the-counter and prescription medicines only as told by your doctor. These include any fiber supplements.  Do pelvic floor retraining exercises, such as: ? Doing deep breathing while relaxing your lower belly (abdomen). ? Relaxing your pelvic floor while pooping.  Watch your condition for any changes.  Keep all follow-up visits as told by your doctor. This is important. Contact a doctor if:  You have pain that gets worse.  You have a fever.  You have not pooped for 4 days.  You throw up (vomit).  You are not hungry.  You lose weight.  You are bleeding from the anus.  You have thin, pencil-like poop (stool). Get help right away if:  You have a fever, and your symptoms suddenly get worse.  You leak poop  or have blood in your poop.  Your belly feels hard or bigger than normal (is bloated).  You have very bad belly pain.  You feel dizzy or you faint. This information is not intended to replace advice given to you by your health care provider. Make sure you discuss any questions you have with your health care provider. Document Released: 03/11/2008 Document Revised: 04/12/2016 Document Reviewed: 03/13/2016 Elsevier Interactive Patient Education  2018 [employer].

## 2018-04-12 LAB — URINE CULTURE: Culture: NO GROWTH

## 2018-04-13 DIAGNOSIS — N3010 Interstitial cystitis (chronic) without hematuria: Secondary | ICD-10-CM | POA: Diagnosis not present

## 2018-04-13 DIAGNOSIS — R109 Unspecified abdominal pain: Secondary | ICD-10-CM | POA: Diagnosis not present

## 2018-04-13 DIAGNOSIS — N3020 Other chronic cystitis without hematuria: Secondary | ICD-10-CM | POA: Diagnosis not present

## 2018-04-13 DIAGNOSIS — R102 Pelvic and perineal pain: Secondary | ICD-10-CM | POA: Diagnosis not present

## 2018-04-13 NOTE — Telephone Encounter (Signed)
I dont have much else to offer except I did check the recent urine culture from the ED, that turned out negative  OK to stop the cephalexin  I can refer to urology if she likes.

## 2018-04-13 NOTE — Telephone Encounter (Signed)
Called pt, LVM with details below  

## 2018-04-17 ENCOUNTER — Telehealth: Payer: Self-pay

## 2018-04-17 ENCOUNTER — Telehealth: Payer: Self-pay | Admitting: Endocrinology

## 2018-04-17 NOTE — Telephone Encounter (Signed)
Patient needs test strips for the FreeStyle [place] sent to [facility] on [address]

## 2018-04-17 NOTE — Telephone Encounter (Signed)
Patient requesting an order for test strips for the freestyle libre that she was told would be sent in- please call patient when this is done

## 2018-04-17 NOTE — Telephone Encounter (Signed)
If she is referring to the sensor this will be sent to the place where she got her original supply

## 2018-04-17 NOTE — Telephone Encounter (Signed)
Please advise on prescription for test strips for this patient she has freestyle libre

## 2018-04-20 ENCOUNTER — Other Ambulatory Visit: Payer: Self-pay

## 2018-04-20 MED ORDER — GLUCOSE BLOOD VI STRP: ORAL_STRIP | 3 refills | Status: AC

## 2018-04-20 NOTE — Telephone Encounter (Signed)
Rx sent to pharmacy   

## 2018-04-20 NOTE — Telephone Encounter (Signed)
As stated in note below patient is asking for test strips not sensors

## 2018-04-21 NOTE — Telephone Encounter (Signed)
Pt was called and notified that the Rx was sent to pharmacy on 04/20/18. Pt verbalized understanding.

## 2018-04-21 NOTE — Telephone Encounter (Signed)
Okay to refill test strips

## 2018-04-22 ENCOUNTER — Telehealth: Payer: Self-pay | Admitting: *Deleted

## 2018-04-22 ENCOUNTER — Ambulatory Visit (INDEPENDENT_AMBULATORY_CARE_PROVIDER_SITE_OTHER): Payer: Medicare Other | Admitting: Women's Health

## 2018-04-22 ENCOUNTER — Encounter: Payer: Self-pay | Admitting: Women's Health

## 2018-04-22 ENCOUNTER — Other Ambulatory Visit: Payer: Self-pay | Admitting: Endocrinology

## 2018-04-22 DIAGNOSIS — B373 Candidiasis of vulva and vagina: Secondary | ICD-10-CM

## 2018-04-22 DIAGNOSIS — N644 Mastodynia: Secondary | ICD-10-CM

## 2018-04-22 DIAGNOSIS — B3731 Acute candidiasis of vulva and vagina: Secondary | ICD-10-CM

## 2018-04-22 DIAGNOSIS — N898 Other specified noninflammatory disorders of vagina: Secondary | ICD-10-CM

## 2018-04-22 LAB — WET PREP FOR TRICH, YEAST, CLUE

## 2018-04-22 MED ORDER — FLUCONAZOLE 150 MG PO TABS: 150.0000 mg | ORAL_TABLET | Freq: Once | ORAL | 1 refills | Status: AC

## 2018-04-22 MED ORDER — FLUCONAZOLE 150 MG PO TABS: 150.0000 mg | ORAL_TABLET | Freq: Once | ORAL | 1 refills | Status: DC

## 2018-04-22 NOTE — Telephone Encounter (Signed)
Patient scheduled on 04/27/18 @ 9:20am at [facility], left message for pt to call.

## 2018-04-22 NOTE — Telephone Encounter (Signed)
Patient informed with the below note.  

## 2018-04-22 NOTE — Telephone Encounter (Signed)
Please advise if ok to refill. Rx is listed under a historical provider.

## 2018-04-22 NOTE — Addendum Note (Signed)
Addended by: Thurnell Garbe A on: 04/22/2018 09:48 AM   Modules accepted: Orders

## 2018-04-22 NOTE — Progress Notes (Signed)
67 year old MBF G3, P3 presents with complaint of diffuse bilateral breast tenderness and vaginal itching for the past week.  Last mammogram 2008.  Has had 2 sisters die from breast cancer  age 74 and 83.  Denies palpable changes, nipple discharge, injury, change in medication or routine.  On estradiol weekly patch.  TAH.  Was on an antibiotic 3 weeks ago for UTI symptoms have resolved.  Medical problems include hypertension, diabetes, hypothyroidism, anxiety, hypercholesteremia, primary care manages.  Exam: Appears well, obese.  Breast examined sitting and lying position without palpable nodules, nipple discharge, visible dimpling or retractions.  Tenderness throughout exam on both breasts.  External genitalia erythematous at introitus, speculum exam moderate amount of white discharge wet prep positive for yeast.  Bilateral breast tenderness with family history of breast cancer Yeast vaginitis  Plan: Diagnostic bilateral mammogram will get scheduled.  Diflucan 150 p.o. x1 dose prescription, proper use reviewed, instructed to call if no relief of vaginal itching.  Reviewed importance of annual screening mammograms.

## 2018-04-22 NOTE — Telephone Encounter (Signed)
-----   Message from Huel Cote, NP sent at 04/22/2018  9:22 AM EDT ----- Needs bilateral diagnostic mammogram.  Bilateral diffuse tenderness, no palpable masses. 2 sisters deceased from br c ages 37,64.   Many years ago mammogram, on estradiol patch.  Anytime ok

## 2018-04-22 NOTE — Patient Instructions (Signed)
Breast Tenderness Breast tenderness is a common problem for women of all ages. Breast tenderness may cause mild discomfort to severe pain. The pain usually comes and goes in association with your menstrual cycle, but it can be constant. Breast tenderness has many possible causes, including hormone changes and some medicines. Your health care provider may order tests, such as a mammogram or an ultrasound, to check for any unusual findings. Having breast tenderness usually does not mean that you have breast cancer. Follow these instructions at home: Sometimes, reassurance that you do not have breast cancer is all that is needed. In general, follow these home care instructions: Managing pain and discomfort  If directed, apply ice to the area: ? Put ice in a plastic bag. ? Place a towel between your skin and the bag. ? Leave the ice on for 20 minutes, 2-3 times a day.  Make sure you are wearing a supportive bra, especially during exercise. You may also want to wear a supportive bra while sleeping if your breasts are very tender. Medicines  Take over-the-counter and prescription medicines only as told by your health care provider. If the cause of your pain is infection, you may be prescribed an antibiotic medicine.  If you were prescribed an antibiotic, take it as told by your health care provider. Do not stop taking the antibiotic even if you start to feel better. General instructions  Your health care provider may recommend that you reduce the amount of fat in your diet. You can do this by: ? Limiting fried foods. ? Cooking foods using methods, such as baking, boiling, grilling, and broiling.  Decrease the amount of caffeine in your diet. You can do this by drinking more water and choosing caffeine-free options.  Keep a log of the days and times when your breasts are most tender.  Ask your health care provider how to do breast exams at home. This will help you notice if you have an unusual  growth or lump. Contact a health care provider if:  Any part of your breast is hard, red, and hot to the touch. This may be a sign of infection.  You are not breastfeeding and you have fluid, especially blood or pus, coming out of your nipples.  You have a fever.  You have a new or painful lump in your breast that remains after your menstrual period ends.  Your pain does not improve or it gets worse.  Your pain is interfering with your daily activities. This information is not intended to replace advice given to you by your health care provider. Make sure you discuss any questions you have with your health care provider. Document Released: 09/05/2008 Document Revised: 06/21/2016 Document Reviewed: 06/21/2016 Elsevier Interactive Patient Education  2018 [employer]. Vaginal Yeast infection, Adult Vaginal yeast infection is a condition that causes soreness, swelling, and redness (inflammation) of the vagina. It also causes vaginal discharge. This is a common condition. Some women get this infection frequently. What are the causes? This condition is caused by a change in the normal balance of the yeast (candida) and bacteria that live in the vagina. This change causes an overgrowth of yeast, which causes the inflammation. What increases the risk? This condition is more likely to develop in:  Women who take antibiotic medicines.  Women who have diabetes.  Women who take birth control pills.  Women who are pregnant.  Women who douche often.  Women who have a weak defense (immune) system.  Women who have  been taking steroid medicines for a long time.  Women who frequently wear tight clothing.  What are the signs or symptoms? Symptoms of this condition include:  White, thick vaginal discharge.  Swelling, itching, redness, and irritation of the vagina. The lips of the vagina (vulva) may be affected as well.  Pain or a burning feeling while urinating.  Pain during  sex.  How is this diagnosed? This condition is diagnosed with a medical history and physical exam. This will include a pelvic exam. Your health care provider will examine a sample of your vaginal discharge under a microscope. Your health care provider may send this sample for testing to confirm the diagnosis. How is this treated? This condition is treated with medicine. Medicines may be over-the-counter or prescription. You may be told to use one or more of the following:  Medicine that is taken orally.  Medicine that is applied as a cream.  Medicine that is inserted directly into the vagina (suppository).  Follow these instructions at home:  Take or apply over-the-counter and prescription medicines only as told by your health care provider.  Do not have sex until your health care provider has approved. Tell your sex partner that you have a yeast infection. That person should go to his or her health care provider if he or she develops symptoms.  Do not wear tight clothes, such as pantyhose or tight pants.  Avoid using tampons until your health care provider approves.  Eat more yogurt. This may help to keep your yeast infection from returning.  Try taking a sitz bath to help with discomfort. This is a warm water bath that is taken while you are sitting down. The water should only come up to your hips and should cover your buttocks. Do this 3-4 times per day or as told by your health care provider.  Do not douche.  Wear breathable, cotton underwear.  If you have diabetes, keep your blood sugar levels under control. Contact a health care provider if:  You have a fever.  Your symptoms go away and then return.  Your symptoms do not get better with treatment.  Your symptoms get worse.  You have new symptoms.  You develop blisters in or around your vagina.  You have blood coming from your vagina and it is not your menstrual period.  You develop pain in your abdomen. This  information is not intended to replace advice given to you by your health care provider. Make sure you discuss any questions you have with your health care provider. Document Released: 07/03/2005 Document Revised: 03/06/2016 Document Reviewed: 03/27/2015 Elsevier Interactive Patient Education  2018 [employer].

## 2018-04-24 ENCOUNTER — Ambulatory Visit (INDEPENDENT_AMBULATORY_CARE_PROVIDER_SITE_OTHER): Payer: Medicare Other | Admitting: Nurse Practitioner

## 2018-04-24 ENCOUNTER — Encounter: Payer: Self-pay | Admitting: Nurse Practitioner

## 2018-04-24 VITALS — BP 120/70 | HR 86 | Ht 66.0 in | Wt 290.0 lb

## 2018-04-24 DIAGNOSIS — R112 Nausea with vomiting, unspecified: Secondary | ICD-10-CM | POA: Diagnosis not present

## 2018-04-24 DIAGNOSIS — K5900 Constipation, unspecified: Secondary | ICD-10-CM

## 2018-04-24 DIAGNOSIS — R109 Unspecified abdominal pain: Secondary | ICD-10-CM

## 2018-04-24 MED ORDER — METOCLOPRAMIDE HCL 5 MG PO TABS: 5.0000 mg | ORAL_TABLET | Freq: Two times a day (BID) | ORAL | 1 refills | Status: DC

## 2018-04-24 NOTE — Patient Instructions (Addendum)
If you are age 68 or older, your body mass index should be between 23-30. Your Body mass index is 46.81 kg/m. If this is out of the aforementioned range listed, please consider follow up with your Primary Care Provider.  If you are age 58 or younger, your body mass index should be between 19-25. Your Body mass index is 46.81 kg/m. If this is out of the aformentioned range listed, please consider follow up with your Primary Care Provider.   We have sent the following medications to your pharmacy for you to pick up at your convenience: Reglan  Start Miralax three times daily until bowels move.  DO NOT take Magnesium Citrate due to decreased kidney function.  Follow up with me on May 08, 2018 at 2:30  pm.  Thank you for choosing me and [facility].   Tye Savoy, NP

## 2018-04-24 NOTE — Progress Notes (Signed)
IMPRESSION and PLAN:    #1.  67 yo with N/V/ left mid abdominal pain / chronic constipaton.  CT scan unremarkable -Need to address constipation. Took liinzess last year, caused diarrhea. She is using Dulcolax supp recommended by PCP.  Will add MiraLAX 3 times daily until bowels moving then decrease to 1 capful in a glass of water daily.  Patient has been taking MOM, I advised against this given her chronic kidney disease -she has declined colonoscopy in past  -needs to increase fluid intake  -avoiding fiber supplement at this point as she gas gastroparesis.  -short trial of low dose reglan 5 mg bid ac -small frequent meals  - ROV with me in 2 weeks  #2.  Recent UTI, treated      HPI:    Chief Complaint:  N/V/ abdominal pain   Patient is a 67 yo female with multiple medical problems. She is known to [place] for history of chronic constipation, GERD and gastroparesis.  Evaluated for N/V in 2013, EGD remarkable for retained food in stomach as well as some old sutures which were removed. She had a another EGD in 2014 for dyspeptic symptomrs (outside facility) with findings of food in stomach again.   Symptoms improved.   Having recurrent N/V over last few weeks. Blood sugars controlled she say, less than 150 most of the time. Vomits within a couple of hours of eating. Also having left mid  / LLQ abdominal pain. Pain unrelated to eating. No urinary sx at present but saw PCP mid June, felt to have UTI and prescribed antibiotics.  Then went to ED late June for N/V and abdominal pain. CT scan unremarkable. WBC was nornmal. Hgb 11.7.. U/A showed positive nitrates, sent home with another round of antibiotics. Culture + for Klebsiella. Had recurrent dysuria, seen by PCP again on 7/6 and u/a abnormal so given more antibitiocs.    Patient not having regular BMs. Thinks she tried Miralax in past, can't remember if it worked well for her.  MOM works. She doesn't drink much fluid, doesn't eat much fiber.  She took Linzess last year, caused too loose of stools   Review of systems:     No chest pain, no SOB, no fevers, no urinary sx   Past Medical History:  Diagnosis Date  . Anxiety   . Arthritis   . Blood dyscrasia    "FREE BLEEDER"  . CERVICAL RADICULOPATHY, LEFT   . Chronic back pain   . CKD (chronic kidney disease)    DR. SANFORD  [facility]  . COMMON MIGRAINE   . CORONARY ARTERY DISEASE   . Cough    CURRENT COLD  . Cystitis   . DEPRESSION   . Diabetes mellitus, type II ([place])   . DIVERTICULOSIS-COLON   . Gastric ulcer 04/2008  . Gastroparesis   . GERD (gastroesophageal reflux disease)   . Hiatal hernia   . Hyperlipidemia   . Hypertension   . Hypothyroidism   . INSOMNIA-SLEEP DISORDER-UNSPEC   . Iron deficiency anemia   . Wears glasses     Patient's surgical history, family medical history, social history, medications and allergies were all reviewed in Epic   Serum creatinine: 1.53 mg/dL (H) 04/05/18 1202 Estimated creatinine clearance: 49 mL/min (A)   Physical Exam:     BP 120/70   Pulse 86   Ht 5\' 6"  (1.676 m)   Wt 290 lb (131.5 kg)   BMI 46.81 kg/m   GENERAL:  Pleasant , obese female in NAD PSYCH: : Cooperative, normal affect EENT:  conjunctiva pink, mucous membranes moist, neck supple without masses CARDIAC:  RRR,  no peripheral edema PULM: Normal respiratory effort, lungs CTA bilaterally, no wheezing ABDOMEN:  Nondistended, soft, nontender. No obvious masses, no hepatomegaly,  normal bowel sounds SKIN:  turgor, no lesions seen Musculoskeletal:  Normal muscle tone, normal strength NEURO: Alert and oriented x 3, no focal neurologic deficits   Tye Savoy , NP 04/24/2018, 9:05 AM

## 2018-04-27 ENCOUNTER — Encounter: Payer: Self-pay | Admitting: Nurse Practitioner

## 2018-04-27 ENCOUNTER — Ambulatory Visit: Payer: Medicare Other

## 2018-04-27 ENCOUNTER — Ambulatory Visit
Admission: RE | Admit: 2018-04-27 | Discharge: 2018-04-27 | Disposition: A | Discharge status: Home / Self Care | Payer: Medicare Other | Source: Ambulatory Visit | Attending: Women's Health | Admitting: Women's Health

## 2018-04-27 DIAGNOSIS — N644 Mastodynia: Secondary | ICD-10-CM

## 2018-04-27 DIAGNOSIS — R922 Inconclusive mammogram: Secondary | ICD-10-CM | POA: Diagnosis not present

## 2018-04-27 NOTE — Progress Notes (Signed)
Reviewed and agree with initial management plan.  [place] Karl T. Nakeia Calvi, MD FACG 

## 2018-04-29 ENCOUNTER — Other Ambulatory Visit: Payer: Self-pay

## 2018-04-29 MED ORDER — ATORVASTATIN CALCIUM 40 MG PO TABS: 40.0000 mg | ORAL_TABLET | Freq: Every day | ORAL | 3 refills | Status: DC

## 2018-05-01 DIAGNOSIS — E113391 Type 2 diabetes mellitus with moderate nonproliferative diabetic retinopathy without macular edema, right eye: Secondary | ICD-10-CM | POA: Diagnosis not present

## 2018-05-01 DIAGNOSIS — H35363 Drusen (degenerative) of macula, bilateral: Secondary | ICD-10-CM | POA: Diagnosis not present

## 2018-05-01 DIAGNOSIS — H25813 Combined forms of age-related cataract, bilateral: Secondary | ICD-10-CM | POA: Diagnosis not present

## 2018-05-01 DIAGNOSIS — H401131 Primary open-angle glaucoma, bilateral, mild stage: Secondary | ICD-10-CM | POA: Diagnosis not present

## 2018-05-01 DIAGNOSIS — E113312 Type 2 diabetes mellitus with moderate nonproliferative diabetic retinopathy with macular edema, left eye: Secondary | ICD-10-CM | POA: Diagnosis not present

## 2018-05-05 DIAGNOSIS — M545 Low back pain: Secondary | ICD-10-CM | POA: Diagnosis not present

## 2018-05-05 DIAGNOSIS — G959 Disease of spinal cord, unspecified: Secondary | ICD-10-CM | POA: Diagnosis not present

## 2018-05-07 ENCOUNTER — Ambulatory Visit: Payer: Medicare Other | Admitting: Nurse Practitioner

## 2018-05-08 ENCOUNTER — Other Ambulatory Visit: Payer: Self-pay | Admitting: Orthopedic Surgery

## 2018-05-08 DIAGNOSIS — M542 Cervicalgia: Secondary | ICD-10-CM

## 2018-05-16 ENCOUNTER — Ambulatory Visit
Admission: RE | Admit: 2018-05-16 | Discharge: 2018-05-16 | Disposition: A | Discharge status: Home / Self Care | Payer: Medicare Other | Source: Ambulatory Visit | Attending: Orthopedic Surgery | Admitting: Orthopedic Surgery

## 2018-05-16 DIAGNOSIS — M542 Cervicalgia: Secondary | ICD-10-CM

## 2018-05-16 DIAGNOSIS — M4802 Spinal stenosis, cervical region: Secondary | ICD-10-CM | POA: Diagnosis not present

## 2018-05-18 ENCOUNTER — Telehealth: Payer: Self-pay | Admitting: Emergency Medicine

## 2018-05-18 NOTE — Telephone Encounter (Signed)
Pt called and stated that the medication Insulin Glargine-Lixisenatide (SOLIQUA) 100-33 UNT-MCG/ML SOPN is too expensive and is wondering if we have any samples. Can you please give her a call back and let her know thanks.

## 2018-05-18 NOTE — Telephone Encounter (Signed)
Please advise 

## 2018-05-18 NOTE — Telephone Encounter (Signed)
We do not give samples of that

## 2018-05-18 NOTE — Telephone Encounter (Signed)
Pt will come by to pick up co-pay card

## 2018-05-21 ENCOUNTER — Telehealth: Payer: Self-pay

## 2018-05-21 NOTE — Telephone Encounter (Signed)
I called patient to stated that we received the CMN form for her test strips through [facility], but Dr. Dwyane Dee wanted to know if she was just using the Norco. I asked that patient call back to let us know.

## 2018-05-25 DIAGNOSIS — M542 Cervicalgia: Secondary | ICD-10-CM | POA: Diagnosis not present

## 2018-05-28 ENCOUNTER — Encounter: Payer: Self-pay | Admitting: Nurse Practitioner

## 2018-05-28 ENCOUNTER — Ambulatory Visit (INDEPENDENT_AMBULATORY_CARE_PROVIDER_SITE_OTHER): Payer: Medicare Other | Admitting: Nurse Practitioner

## 2018-05-28 VITALS — BP 132/78 | HR 81 | Ht 66.0 in | Wt 268.0 lb

## 2018-05-28 DIAGNOSIS — M4326 Fusion of spine, lumbar region: Secondary | ICD-10-CM | POA: Diagnosis not present

## 2018-05-28 DIAGNOSIS — K5909 Other constipation: Secondary | ICD-10-CM | POA: Diagnosis not present

## 2018-05-28 DIAGNOSIS — R1084 Generalized abdominal pain: Secondary | ICD-10-CM

## 2018-05-28 DIAGNOSIS — R112 Nausea with vomiting, unspecified: Secondary | ICD-10-CM | POA: Diagnosis not present

## 2018-05-28 DIAGNOSIS — M545 Low back pain: Secondary | ICD-10-CM | POA: Diagnosis not present

## 2018-05-28 MED ORDER — PLECANATIDE 3 MG PO TABS: 3.0000 mg | ORAL_TABLET | Freq: Every day | ORAL | 1 refills | Status: DC

## 2018-05-28 NOTE — Progress Notes (Signed)
P Primary GI:  Lucio Edward, MD       Chief Complaint:    constipation  IMPRESSION and PLAN:    #1. 67 yo here for follow up on nausea / vomiting, left mid abdominal pain, chronic constipaton.   Nausea and vomiting have nearly resolved with low-dose Reglan twice a day but she continues to have problems with constipation characterized by decreased frequency / hard stools -Continue low-dose Reglan before meals twice daily -Patient tried Linzess in the past but it caused diarrhea.  She reduced dose but then it was ineffective.  I recommended MiraLAX 2-3 times a day but it didn't help.  She has been taking fiber tablets for a couple of weeks now.  She started taking mineral oil every day and that has worked better than anything.  -I advised her against long-term daily use of mineral oil. We will try Trulance 3 mg daily for constipation.  -I reiterated need to increase water intake. Currently drinks about 2.5 glasses of water a day. Goal if 6-8 glasses day.  -Follow-up with me in approximately 6 weeks.   #2.  Colon cancer screening.  Patient has not had a colonoscopy in 10 years or so.  She has declined them in the past.  I spoke with her about it again today, she is still reluctant..  If constipation refractory to all of the above interventions then we are going to talk more about colonoscopy at next visit      HPI:     Patient is a 67 year old female with with multiple medical problems not limited to DM, CKD, HTN, and  thyroid disease. I saw her in July for nausea,  vomiting and left mid abdominal pain. For chronic constipation. I recommended Miralax 2-3 times a day.  I stopped the Mg+ Citrate due to her renal disease.  For nausea and vomiting I gave her a short trial of low-dose Reglan and advised small frequent meals.  She is here for follow-up.  Ms Vinal feels much better overall. No abdominal pain, no significant nausea.She is tolerating the low dose reglan. Her A1c early July was  5.7. Her only complaint is persisent constipation. She says the increased dose of miralax was helpful. She started taking mineral oil and now having a BM every day. No rectal bleeding  Review of systems:     No chest pain, no SOB, no fevers, no urinary sx   Past Medical History:  Diagnosis Date  . Anxiety   . Arthritis   . Blood dyscrasia    "FREE BLEEDER"  . CERVICAL RADICULOPATHY, LEFT   . Chronic back pain   . CKD (chronic kidney disease)    DR. SANFORD  [facility]  . COMMON MIGRAINE   . CORONARY ARTERY DISEASE   . Cough    CURRENT COLD  . Cystitis   . DEPRESSION   . Diabetes mellitus, type II ([place])   . DIVERTICULOSIS-COLON   . Gastric ulcer 04/2008  . Gastroparesis   . GERD (gastroesophageal reflux disease)   . Hiatal hernia   . Hyperlipidemia   . Hypertension   . Hypothyroidism   . INSOMNIA-SLEEP DISORDER-UNSPEC   . Iron deficiency anemia   . Wears glasses     Patient's surgical history, family medical history, social history, medications and allergies were all reviewed in Epic   Creatinine clearance cannot be calculated (Patient's most recent lab result is older than the maximum 21 days allowed.)  Current Outpatient Medications  Medication  Sig Dispense Refill  . acetaminophen-codeine (TYLENOL #3) 300-30 MG tablet Take 3 tablets by mouth every 8 (eight) hours as needed for moderate pain.     Marland Kitchen aspirin 81 MG EC tablet Take 81 mg by mouth every evening.     Marland Kitchen atorvastatin (LIPITOR) 40 MG tablet Take 1 tablet (40 mg total) by mouth daily. 30 tablet 3  . buPROPion (WELLBUTRIN XL) 150 MG 24 hr tablet Take 1 tablet (150 mg total) by mouth daily. 90 tablet 3  . cephALEXin (KEFLEX) 500 MG capsule Take 1 capsule (500 mg total) by mouth 4 (four) times daily. 28 capsule 0  . Continuous Blood Gluc Receiver (FREESTYLE LIBRE 14 DAY READER) DEVI by Does not apply route.    Marland Kitchen ELMIRON 100 MG capsule Take 100 mg by mouth 2 (two) times daily.     Marland Kitchen estradiol (CLIMARA - DOSED IN  MG/24 HR) 0.1 mg/24hr patch Place 1 patch (0.1 mg total) onto the skin once a week. 12 patch 4  . FLUoxetine (PROZAC) 20 MG capsule TAKE 20mg  BY MOUTH DAILY 90 capsule 3  . glucose blood (FREESTYLE TEST STRIPS) test strip Use as instructed to check blood sugar 4 times daily. 100 each 3  . insulin aspart (NOVOLOG FLEXPEN) 100 UNIT/ML FlexPen Inject 10 Units into the skin 3 (three) times daily with meals.    . Insulin Glargine-Lixisenatide (SOLIQUA) 100-33 UNT-MCG/ML SOPN Inject 34 Units into the skin daily with breakfast. 3 pen 3  . levETIRAcetam (KEPPRA) 250 MG tablet Take 250-500 mg by mouth 2 (two) times daily. Dose depends on level of pain.    Marland Kitchen levothyroxine (SYNTHROID, LEVOTHROID) 125 MCG tablet Take 1 tablet (125 mcg total) by mouth daily. 90 tablet 3  . losartan (COZAAR) 100 MG tablet Take 1 tablet (100 mg total) by mouth daily. 90 tablet 3  . metoCLOPramide (REGLAN) 5 MG tablet Take 1 tablet (5 mg total) by mouth 2 (two) times daily. Take 30  Minutes before breakfast and dinner. 60 tablet 1  . naltrexone (DEPADE) 50 MG tablet Take 1 tablet (50 mg total) by mouth daily. 90 tablet 3  . ondansetron (ZOFRAN ODT) 4 MG disintegrating tablet Take 1 tablet (4 mg total) by mouth every 8 (eight) hours as needed. 10 tablet 0  . oxyCODONE-acetaminophen (PERCOCET/ROXICET) 5-325 MG tablet Take 1 tablet by mouth every 4 (four) hours as needed for severe pain. 15 tablet 0  . phenazopyridine (PYRIDIUM) 200 MG tablet Take 1 tablet (200 mg total) by mouth 3 (three) times daily. 6 tablet 0  . progesterone (PROMETRIUM) 100 MG capsule Take 1 capsule (100 mg total) by mouth at bedtime. 90 capsule 4  . promethazine (PHENERGAN) 25 MG tablet Take 1 tablet (25 mg total) by mouth every 8 (eight) hours as needed for nausea or vomiting. 20 tablet 0  . torsemide (DEMADEX) 20 MG tablet Take 20 mg by mouth daily.     No current facility-administered medications for this visit.     Physical Exam:     BP 132/78   Pulse  81   Ht 5\' 6"  (1.676 m)   Wt 268 lb (121.6 kg)   BMI 43.26 kg/m   GENERAL:  Pleasant obese white female in NAD PSYCH: : Cooperative, normal affect EENT:  conjunctiva pink, mucous membranes moist, neck supple without masses CARDIAC:  RRR, no murmur heard, no peripheral edema PULM: Normal respiratory effort, lungs CTA bilaterally, no wheezing ABDOMEN:  Nondistended, soft, nontender. No obvious masses, no hepatomegaly,  normal bowel sounds SKIN:  turgor, no lesions seen Musculoskeletal:  Normal muscle tone, normal strength NEURO: Alert and oriented x 3, no focal neurologic deficits   Tye Savoy , NP 05/28/2018, 10:37 AM

## 2018-05-28 NOTE — Patient Instructions (Addendum)
If you are age 68 or older, your body mass index should be between 23-30. Your Body mass index is 43.26 kg/m. If this is out of the aforementioned range listed, please consider follow up with your Primary Care Provider.  If you are age 10 or younger, your body mass index should be between 19-25. Your Body mass index is 43.26 kg/m. If this is out of the aformentioned range listed, please consider follow up with your Primary Care Provider.   We have sent the following medications to your pharmacy for you to pick up at your convenience: Trulance - if you have a problem getting this medication, please contact the office so that we can prescribe an alternative medication.  INCREASE water to 6-8 8 ounce glasses daily.  Follow up with me on September 24,2019 at 11 a.m.  Thank you for choosing me and [facility].   Tye Savoy, NP

## 2018-05-29 ENCOUNTER — Telehealth: Payer: Self-pay | Admitting: Nurse Practitioner

## 2018-05-29 NOTE — Telephone Encounter (Signed)
Patient states medication sent in yesterday 8.22.19 was about $500 and she can not afford that. Patient wants to know if she should try low dose linzess or something else.

## 2018-05-29 NOTE — Telephone Encounter (Signed)
Linzess at all mcg has been tried before. Do you want to try Amitiza? Trulance was too expensive. She has Medicare which disqualifies any discounts.

## 2018-05-31 ENCOUNTER — Encounter: Payer: Self-pay | Admitting: Nurse Practitioner

## 2018-06-01 NOTE — Telephone Encounter (Signed)
Any change she could get Amitiza? If not then we will have to workup with what we have. She has to increase fluid intake as discussed. Trial of daily Benefiber and Miralax 1-2 times a day.

## 2018-06-01 NOTE — Progress Notes (Signed)
Reviewed and agree with initial management plan.  Jie Stickels T. Carolyna Yerian, MD FACG 

## 2018-06-02 ENCOUNTER — Other Ambulatory Visit: Payer: Self-pay

## 2018-06-02 DIAGNOSIS — M4326 Fusion of spine, lumbar region: Secondary | ICD-10-CM | POA: Diagnosis not present

## 2018-06-02 DIAGNOSIS — M545 Low back pain: Secondary | ICD-10-CM | POA: Diagnosis not present

## 2018-06-02 MED ORDER — LUBIPROSTONE 24 MCG PO CAPS: 24.0000 ug | ORAL_CAPSULE | Freq: Two times a day (BID) | ORAL | 3 refills | Status: DC

## 2018-06-02 NOTE — Telephone Encounter (Signed)
I have spoken with the pharmacist. The Amitiza is non-formulary also and will cost in excess of $400.00. The patient is interested in applying for patient assistance through the drug company.  Which medication should I help her apply for? Trulance or Amitiza?

## 2018-06-02 NOTE — Telephone Encounter (Signed)
Lets stick with Trulance. 3mg  daily please. #30, 2 ref

## 2018-06-03 NOTE — Telephone Encounter (Signed)
Spoke with the patient. She is interested in checking for to see if she qualifies for the patient assistance program. She has nonfunctional insurance with her extremely high co-pays and non-coverage of the medications in this classification.  Application for Trulance patient assistance will be picked up by the patient. It will be completed by the patient and faxed from this office when she returns it.

## 2018-06-04 DIAGNOSIS — M4326 Fusion of spine, lumbar region: Secondary | ICD-10-CM | POA: Diagnosis not present

## 2018-06-04 DIAGNOSIS — M545 Low back pain: Secondary | ICD-10-CM | POA: Diagnosis not present

## 2018-06-10 DIAGNOSIS — M4326 Fusion of spine, lumbar region: Secondary | ICD-10-CM | POA: Diagnosis not present

## 2018-06-10 DIAGNOSIS — M545 Low back pain: Secondary | ICD-10-CM | POA: Diagnosis not present

## 2018-06-12 ENCOUNTER — Other Ambulatory Visit: Payer: Medicare Other

## 2018-06-15 ENCOUNTER — Other Ambulatory Visit (INDEPENDENT_AMBULATORY_CARE_PROVIDER_SITE_OTHER): Payer: Medicare Other

## 2018-06-15 DIAGNOSIS — M545 Low back pain: Secondary | ICD-10-CM | POA: Diagnosis not present

## 2018-06-15 DIAGNOSIS — Z794 Long term (current) use of insulin: Secondary | ICD-10-CM | POA: Diagnosis not present

## 2018-06-15 DIAGNOSIS — E890 Postprocedural hypothyroidism: Secondary | ICD-10-CM

## 2018-06-15 DIAGNOSIS — M4326 Fusion of spine, lumbar region: Secondary | ICD-10-CM | POA: Diagnosis not present

## 2018-06-15 DIAGNOSIS — E1165 Type 2 diabetes mellitus with hyperglycemia: Secondary | ICD-10-CM | POA: Diagnosis not present

## 2018-06-15 LAB — COMPREHENSIVE METABOLIC PANEL
ALT: 10 U/L (ref 0–35)
AST: 9 U/L (ref 0–37)
Albumin: 3.9 g/dL (ref 3.5–5.2)
Alkaline Phosphatase: 93 U/L (ref 39–117)
BUN: 21 mg/dL (ref 6–23)
CO2: 27 mEq/L (ref 19–32)
Calcium: 9.4 mg/dL (ref 8.4–10.5)
Chloride: 105 mEq/L (ref 96–112)
Creatinine, Ser: 1.49 mg/dL — ABNORMAL HIGH (ref 0.40–1.20)
GFR: 44.71 mL/min — ABNORMAL LOW (ref >60.00)
Glucose, Bld: 114 mg/dL — ABNORMAL HIGH (ref 70–99)
Potassium: 4.6 mEq/L (ref 3.5–5.1)
Sodium: 137 mEq/L (ref 135–145)
Total Bilirubin: 0.5 mg/dL (ref 0.2–1.2)
Total Protein: 7.2 g/dL (ref 6.0–8.3)

## 2018-06-15 LAB — LIPID PANEL
Cholesterol: 150 mg/dL (ref 0–200)
HDL: 50 mg/dL (ref >39.00)
LDL Cholesterol: 85 mg/dL (ref 0–99)
NonHDL: 100.44
Total CHOL/HDL Ratio: 3
Triglycerides: 77 mg/dL (ref 0.0–149.0)
VLDL: 15.4 mg/dL (ref 0.0–40.0)

## 2018-06-15 LAB — T4, FREE: Free T4: 0.9 ng/dL (ref 0.60–1.60)

## 2018-06-15 LAB — TSH: TSH: 0.41 u[IU]/mL (ref 0.35–4.50)

## 2018-06-16 LAB — FRUCTOSAMINE: Fructosamine: 279 umol/L (ref 0–285)

## 2018-06-17 ENCOUNTER — Encounter: Payer: Self-pay | Admitting: Endocrinology

## 2018-06-17 ENCOUNTER — Ambulatory Visit (INDEPENDENT_AMBULATORY_CARE_PROVIDER_SITE_OTHER): Payer: Medicare Other | Admitting: Endocrinology

## 2018-06-17 VITALS — BP 128/62 | HR 86 | Ht 66.0 in | Wt 286.0 lb

## 2018-06-17 DIAGNOSIS — K3184 Gastroparesis: Secondary | ICD-10-CM | POA: Diagnosis not present

## 2018-06-17 DIAGNOSIS — Z794 Long term (current) use of insulin: Secondary | ICD-10-CM | POA: Diagnosis not present

## 2018-06-17 DIAGNOSIS — E1143 Type 2 diabetes mellitus with diabetic autonomic (poly)neuropathy: Secondary | ICD-10-CM

## 2018-06-17 DIAGNOSIS — Z23 Encounter for immunization: Secondary | ICD-10-CM | POA: Diagnosis not present

## 2018-06-17 DIAGNOSIS — E890 Postprocedural hypothyroidism: Secondary | ICD-10-CM | POA: Diagnosis not present

## 2018-06-17 DIAGNOSIS — E1165 Type 2 diabetes mellitus with hyperglycemia: Secondary | ICD-10-CM

## 2018-06-17 NOTE — Patient Instructions (Addendum)
Take 34units of Lantus for 1 week  If the nausea does not go away then go back to [place] but take only 26 units   Take reglan 3x-4x daily  Take only half a pill every Sunday of thyroid

## 2018-06-17 NOTE — Progress Notes (Signed)
[name] is a 67 y.o. female.    Reason for Appointment: Diabetes follow-up   History of Present Illness   Diagnosis: Type 2 DIABETES MELITUS, date of diagnosis: 2000  Previous history: She had initially been treated with metformin and Amaryl and subsequently given Victoza which helped overall control and weight loss. In 9/13 because of significant hyperglycemia she was started on basal bolus insulin also. Had required relatively small doses of insulin. Her blood sugar control has been excellent with A1c between 5.3-6.0 although this may be relatively higher than expected. Tends to have relatively higher readings after supper In 6/15 she was told to resume her Victoza and stop her Lantus since she had been gaining weight with Lantus and NovoLog regimen.   Recent history:     INSULIN regimen: Novolog 5-7 units before meals; Soliqua 34 units once daily   Non-insulin hypoglycemic drugs: Soliqua 34 units daily   Her A1c is recently lower at 5.7, previously 6.1  Current blood sugar patterns and management:  She has been able to continue [place] since about 12/2017  On her last visit when she had to stop this temporarily she was told to restart gradually at 15 units but she appears to have gone back up to 34 units on her own  However she has been able to start using the freestyle libre sensor now, she thinks that the readings are fairly accurate but not clear if she has consistently compared this to the fingerstick and on her lab day her sensor was not active  With continuing [place] her overnight and fasting glucose appears to be getting low  She continues to still have problem with decreased appetite and nausea despite management by gastroenterologist  LOWEST blood sugars are overnight but he does not appear to have any significant postprandial peaks except occasionally midday  However not checking enough readings at bedtime to get consistent better  Also on some  days has incomplete monitoring with checking her sugar as little as 2-3 times  On her Accu-Chek she did have significantly low sugar below 50 occasionally at 3-4 AM but not recently documented  Her blood sugar on a daily average basis recently has been around 120     Side effects from medications:  none  Monitors blood glucose: Once a day or less on average .    Glucometer:  Freestyle Libre/Accu-Chek .          Blood Glucose readings as follows  Currently using the freestyle libre 68% of the time on the last 2 weeks download  PRE-MEAL Fasting Lunch Dinner Bedtime Overall  Glucose range:  68 -120      Mean/median:  80  130   127  110+/-37   POST-MEAL PC Breakfast PC Lunch PC Dinner  Glucose range:     Mean/median:   134  137    Meals: 1-2 meals per day. Lunch Supper at 7 PM pm           Physical activity: exercise: Limited, has had fatigue, back and leg pain.            Dietician visit: Most recent: 05/5276            Complications: are: None  Wt Readings from Last 3 Encounters:  06/17/18 286 lb (129.7 kg)  05/28/18 268 lb (121.6 kg)  04/24/18 290 lb (131.5 kg)    LABS:  Lab Results  Component Value Date   HGBA1C 5.7 (A) 04/10/2018  HGBA1C 6.1 12/31/2017   HGBA1C 7.9 (H) 08/07/2017   Lab Results  Component Value Date   MICROALBUR 2.0 (H) 12/31/2017   LDLCALC 85 06/15/2018   CREATININE 1.49 (H) 06/15/2018    Other active problems: See review of systems   Appointment on 06/15/2018  Component Date Value Ref Range Status  . Fructosamine 06/15/2018 279  0 - 285 umol/L Final   Comment: Published reference interval for apparently healthy subjects between age 65 and 71 is 16 - 285 umol/L and in a poorly controlled diabetic population is 228 - 563 umol/L with a mean of 396 umol/L.   Marland Kitchen Free T4 06/15/2018 0.90  0.60 - 1.60 ng/dL Final   Comment: Specimens from patients who are undergoing biotin therapy and /or ingesting biotin supplements may contain high levels  of biotin.  The higher biotin concentration in these specimens interferes with this Free T4 assay.  Specimens that contain high levels  of biotin may cause false high results for this Free T4 assay.  Please interpret results in light of the total clinical presentation of the patient.    Marland Kitchen TSH 06/15/2018 0.41  0.35 - 4.50 uIU/mL Final  . Cholesterol 06/15/2018 150  0 - 200 mg/dL Final   ATP III Classification       Desirable:  < 200 mg/dL               Borderline High:  200 - 239 mg/dL          High:  > = 240 mg/dL  . Triglycerides 06/15/2018 77.0  0.0 - 149.0 mg/dL Final   Normal:  <150 mg/dLBorderline High:  150 - 199 mg/dL  . HDL 06/15/2018 50.00  >39.00 mg/dL Final  . VLDL 06/15/2018 15.4  0.0 - 40.0 mg/dL Final  . LDL Cholesterol 06/15/2018 85  0 - 99 mg/dL Final  . Total CHOL/HDL Ratio 06/15/2018 3   Final                  Men          Women1/2 Average Risk     3.4          3.3Average Risk          5.0          4.42X Average Risk          9.6          7.13X Average Risk          15.0          11.0                      . NonHDL 06/15/2018 100.44   Final   NOTE:  Non-HDL goal should be 30 mg/dL higher than patient's LDL goal (i.e. LDL goal of < 70 mg/dL, would have non-HDL goal of < 100 mg/dL)  . Sodium 06/15/2018 137  135 - 145 mEq/L Final  . Potassium 06/15/2018 4.6  3.5 - 5.1 mEq/L Final  . Chloride 06/15/2018 105  96 - 112 mEq/L Final  . CO2 06/15/2018 27  19 - 32 mEq/L Final  . Glucose, Bld 06/15/2018 114* 70 - 99 mg/dL Final  . BUN 06/15/2018 21  6 - 23 mg/dL Final  . Creatinine, Ser 06/15/2018 1.49* 0.40 - 1.20 mg/dL Final  . Total Bilirubin 06/15/2018 0.5  0.2 - 1.2 mg/dL Final  . Alkaline Phosphatase 06/15/2018 93  39 - 117 U/L Final  .  AST 06/15/2018 9  0 - 37 U/L Final  . ALT 06/15/2018 10  0 - 35 U/L Final  . Total Protein 06/15/2018 7.2  6.0 - 8.3 g/dL Final  . Albumin 06/15/2018 3.9  3.5 - 5.2 g/dL Final  . Calcium 06/15/2018 9.4  8.4 - 10.5 mg/dL Final  . GFR 06/15/2018  44.71* >60.00 mL/min Final    Allergies as of 06/17/2018      Reactions   Hydrocodone Other (See Comments)   Tachycardia, SOB   Ciprofloxacin Other (See Comments)   Dizziness, SOB   Penicillins Hives   Pt just remembers hives Has patient had a PCN reaction causing immediate rash, facial/tongue/throat swelling, SOB or lightheadedness with hypotension: No Has patient had a PCN reaction causing severe rash involving mucus membranes or skin necrosis: No Has patient had a PCN reaction that required hospitalization No Has patient had a PCN reaction occurring within the last 10 years: No If all of the above answers are "NO", then may proceed with Cephalosporin use.      Medication List        Accurate as of 06/17/18  9:26 PM. Always use your most recent med list.          acetaminophen-codeine 300-30 MG tablet Commonly known as:  TYLENOL #3 Take 3 tablets by mouth every 8 (eight) hours as needed for moderate pain.   aspirin 81 MG EC tablet Take 81 mg by mouth every evening.   atorvastatin 40 MG tablet Commonly known as:  LIPITOR Take 1 tablet (40 mg total) by mouth daily.   buPROPion 150 MG 24 hr tablet Commonly known as:  WELLBUTRIN XL Take 1 tablet (150 mg total) by mouth daily.   ELMIRON 100 MG capsule Generic drug:  pentosan polysulfate Take 100 mg by mouth 2 (two) times daily.   estradiol 0.1 mg/24hr patch Commonly known as:  CLIMARA - Dosed in mg/24 hr Place 1 patch (0.1 mg total) onto the skin once a week.   FLUoxetine 20 MG capsule Commonly known as:  PROZAC TAKE 20mg  BY MOUTH DAILY   FREESTYLE LIBRE 14 DAY READER Devi by Does not apply route.   glucose blood test strip Use as instructed to check blood sugar 4 times daily.   Insulin Glargine-Lixisenatide 100-33 UNT-MCG/ML Sopn Inject 34 Units into the skin daily with breakfast.   levETIRAcetam 250 MG tablet Commonly known as:  KEPPRA Take 250-500 mg by mouth 2 (two) times daily. Dose depends on level of  pain.   levothyroxine 125 MCG tablet Commonly known as:  SYNTHROID, LEVOTHROID Take 1 tablet (125 mcg total) by mouth daily.   losartan 100 MG tablet Commonly known as:  COZAAR Take 1 tablet (100 mg total) by mouth daily.   lubiprostone 24 MCG capsule Commonly known as:  AMITIZA Take 1 capsule (24 mcg total) by mouth 2 (two) times daily with a meal.   metoCLOPramide 5 MG tablet Commonly known as:  REGLAN Take 1 tablet (5 mg total) by mouth 2 (two) times daily. Take 30  Minutes before breakfast and dinner.   naltrexone 50 MG tablet Commonly known as:  DEPADE Take 1 tablet (50 mg total) by mouth daily.   NOVOLOG FLEXPEN 100 UNIT/ML FlexPen Generic drug:  insulin aspart Inject 10 Units into the skin 3 (three) times daily with meals.   ondansetron 4 MG disintegrating tablet Commonly known as:  ZOFRAN-ODT Take 1 tablet (4 mg total) by mouth every 8 (eight) hours as needed.  oxyCODONE-acetaminophen 5-325 MG tablet Commonly known as:  PERCOCET/ROXICET Take 1 tablet by mouth every 4 (four) hours as needed for severe pain.   phenazopyridine 200 MG tablet Commonly known as:  PYRIDIUM Take 1 tablet (200 mg total) by mouth 3 (three) times daily.   progesterone 100 MG capsule Commonly known as:  PROMETRIUM Take 1 capsule (100 mg total) by mouth at bedtime.   promethazine 25 MG tablet Commonly known as:  PHENERGAN Take 1 tablet (25 mg total) by mouth every 8 (eight) hours as needed for nausea or vomiting.   torsemide 20 MG tablet Commonly known as:  DEMADEX Take 20 mg by mouth daily.       Allergies:  Allergies  Allergen Reactions  . Hydrocodone Other (See Comments)    Tachycardia, SOB   . Ciprofloxacin Other (See Comments)    Dizziness, SOB   . Penicillins Hives    Pt just remembers hives Has patient had a PCN reaction causing immediate rash, facial/tongue/throat swelling, SOB or lightheadedness with hypotension: No Has patient had a PCN reaction causing severe  rash involving mucus membranes or skin necrosis: No Has patient had a PCN reaction that required hospitalization No Has patient had a PCN reaction occurring within the last 10 years: No If all of the above answers are "NO", then may proceed with Cephalosporin use.     Past Medical History:  Diagnosis Date  . Anxiety   . Arthritis   . Blood dyscrasia    "FREE BLEEDER"  . CERVICAL RADICULOPATHY, LEFT   . Chronic back pain   . CKD (chronic kidney disease)    DR. SANFORD  [facility]  . COMMON MIGRAINE   . CORONARY ARTERY DISEASE   . Cough    CURRENT COLD  . Cystitis   . DEPRESSION   . Diabetes mellitus, type II ([place])   . DIVERTICULOSIS-COLON   . Gastric ulcer 04/2008  . Gastroparesis   . GERD (gastroesophageal reflux disease)   . Hiatal hernia   . Hyperlipidemia   . Hypertension   . Hypothyroidism   . INSOMNIA-SLEEP DISORDER-UNSPEC   . Iron deficiency anemia   . Wears glasses     Past Surgical History:  Procedure Laterality Date  . ABDOMINAL HYSTERECTOMY    . ANTERIOR LAT LUMBAR FUSION Left 08/13/2017   Procedure: LEFT SIDED LUMBAR 3-4 LATERAL INTERBODY FUSION WITH INSTRUMENTATION AND ALLOGRAFT;  Surgeon: Phylliss Bob, MD;  Location: [facility];  Service: Orthopedics;  Laterality: Left;  LEFT SIDED LUMBAR 3-4 LATERAL INTERBODY FUSION WITH INSTRUMENTATION AND ALLOGRAFT; REQUEST 3 HOURS  . BACK SURGERY  03/2016  . BREAST EXCISIONAL BIOPSY Right    40 years ago  . CHOLECYSTECTOMY  06/2009  . COLONOSCOPY    . ESOPHAGOGASTRODUODENOSCOPY    . EYE SURGERY Bilateral    lasik  . Gastric Wedge resection lipoma  11/2007   x2 with laparotomy and gastrostomy  . Left knee replacement    . Rigth knee replacement with revision  04/2008   Dr. Berenice Primas  . ROTATOR CUFF REPAIR Left 01/2009  . s/p bladder surgury  09/2009   Dr. Terance Hart    Family History  Problem Relation Age of Onset  . Diabetes Sister        x 3  . Heart disease Sister        x2  . Breast cancer Sister 72  .  Diabetes Brother        x3  . Heart disease Brother  x2  . Coronary artery disease Other        female 1st degree  . Hypertension Other   . Breast cancer Sister 51  . Breast cancer Other 25  . Colon cancer Neg Hx     Social History:  reports that she has quit smoking. She has never used smokeless tobacco. She reports that she does not drink alcohol or use drugs.  Review of Systems:  Hypertension: Her blood pressure is treated with only losartan 100 mg currently and followed by nephrologist  She is asking about decreased appetite and nausea that is persisting However she is taking Reglan only 5 mg before her lunch and bedtime Has not had a gastric emptying study  CKD: Her creatinine is variable, followed by nephrologist also  No progression  Lab Results  Component Value Date   CREATININE 1.49 (H) 06/15/2018   CREATININE 1.53 (H) 04/05/2018   CREATININE 1.43 (H) 02/02/2018   CREATININE 1.67 (H) 12/31/2017     Lipids: Followed by PCP, LDL has been inconsistently controlled  Taking Lipitor 40 mg,  lipids are recently better  Lab Results  Component Value Date   CHOL 150 06/15/2018   HDL 50.00 06/15/2018   LDLCALC 85 06/15/2018   LDLDIRECT 156.6 01/07/2011   TRIG 77.0 06/15/2018   CHOLHDL 3 06/15/2018     Post ablative hypothyroidism:  Previously she had I-131 treatment on 08/20/11 for Graves' disease  Her dose has been adjusted periodically Since her TSH was markedly increased in 3/19 her dose was increased up to 125 mcg She takes this daily before breakfast  Still compliant with her levothyroxine daily  TSH is mildly suppressed and she was being instructed to take 6-1/2 tablets a week but is taking 1 tablet daily  Lab Results  Component Value Date   TSH 0.41 06/15/2018   TSH 0.26 (L) 02/02/2018   TSH 10.81 (H) 12/31/2017   FREET4 0.90 06/15/2018   FREET4 0.53 (L) 12/31/2017   FREET4 0.64 05/13/2017    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     ASSESSMENT/ PLAN:   Diabetes type 2, uncontrolled with obesity:   See history of present illness for detailed discussion of current management, blood sugar patterns and problems identified  Last A1c is 5.7  Although she is benefiting from using [place] she appears to be having persistent nausea and decreased appetite She did start taking Soliqua for late March this year but not clear if her nausea started right after this  With this her blood sugars are consistently controlled throughout the day and she is needing only very little NovoLog at times However she is getting some hypoglycemia to the night which may or may not be symptomatic  Recommendations:  Since it is unclear whether she has gastroparesis she will need to hold off on the [facility] for a week to see if this improves the nausea  In the meantime she will take 34 units of Lantus for the 1 week  Will proceed with getting patient assistance program started for her [name]  If she does restart the [place] after 1 week she will only take 2016  Discussed needing to check blood sugars at least 3-4 times a day to get a  better blood sugar pattern  Balanced meals with some protein  Hypothyroidism: Her TSH is low normal even without any weight loss Considering her age and multiple problems she needs to reduce her her dose by half tablet weekly Will need follow-up on the next visit but may consider reducing her dose further  LIPIDS: LDL improved and she is taking treatment but also her appetite and intake has been much less recently  ?  GASTROPARESIS: She will need to take Reglan at least 3 times a day including before each meal and on waking up and not at bedtime because of her renal dysfunction  HYPERTENSION: Controlled with losartan with stable renal function      Total visit time for evaluation and management of multiple problems and counseling =25 minutes  Influenza vaccine given, patient information on the vaccine also handed out  Patient Instructions  Take 34units of Lantus for 1 week  If the nausea does not go away then go back to [place] but take only 26 units   Take reglan 3x-4x daily  Take only half a pill every Sunday of thyroid       [name] 06/17/2018, 9:26 PM   Note: This office note was prepared with Dragon voice recognition system technology. Any transcriptional errors that result from this process are unintentional.

## 2018-06-18 DIAGNOSIS — M545 Low back pain: Secondary | ICD-10-CM | POA: Diagnosis not present

## 2018-06-18 DIAGNOSIS — M4326 Fusion of spine, lumbar region: Secondary | ICD-10-CM | POA: Diagnosis not present

## 2018-06-23 DIAGNOSIS — M545 Low back pain: Secondary | ICD-10-CM | POA: Diagnosis not present

## 2018-06-23 DIAGNOSIS — M4326 Fusion of spine, lumbar region: Secondary | ICD-10-CM | POA: Diagnosis not present

## 2018-06-30 ENCOUNTER — Encounter: Payer: Self-pay | Admitting: Nurse Practitioner

## 2018-06-30 ENCOUNTER — Ambulatory Visit (INDEPENDENT_AMBULATORY_CARE_PROVIDER_SITE_OTHER): Payer: Medicare Other | Admitting: Nurse Practitioner

## 2018-06-30 ENCOUNTER — Other Ambulatory Visit (INDEPENDENT_AMBULATORY_CARE_PROVIDER_SITE_OTHER): Payer: Medicare Other

## 2018-06-30 VITALS — BP 138/76 | HR 82 | Ht 66.0 in | Wt 282.6 lb

## 2018-06-30 DIAGNOSIS — D539 Nutritional anemia, unspecified: Secondary | ICD-10-CM

## 2018-06-30 DIAGNOSIS — K5909 Other constipation: Secondary | ICD-10-CM | POA: Diagnosis not present

## 2018-06-30 DIAGNOSIS — M545 Low back pain: Secondary | ICD-10-CM | POA: Diagnosis not present

## 2018-06-30 DIAGNOSIS — M4326 Fusion of spine, lumbar region: Secondary | ICD-10-CM | POA: Diagnosis not present

## 2018-06-30 LAB — VITAMIN B12: Vitamin B-12: >1500 pg/mL — ABNORMAL HIGH (ref 211–911)

## 2018-06-30 LAB — FOLATE: Folate: 12.4 ng/mL (ref >5.9)

## 2018-06-30 MED ORDER — PEG 3350-KCL-NA BICARB-NACL 420 G PO SOLR: 4000.0000 mL | Freq: Once | ORAL | 0 refills | Status: AC

## 2018-06-30 MED ORDER — LINACLOTIDE 72 MCG PO CAPS: 72.0000 ug | ORAL_CAPSULE | Freq: Every day | ORAL | 1 refills | Status: DC

## 2018-06-30 NOTE — Progress Notes (Signed)
Reviewed and agree with initial management plan.  Christinna Sprung T. Tavis Kring, MD FACG 

## 2018-06-30 NOTE — Patient Instructions (Signed)
Your provider has requested that you go to the basement level for lab work before leaving today. Press "B" on the elevator. The lab is located at the first door on the left as you exit the elevator.  We have sent the following medications to your pharmacy for you to pick up at your convenience: Golytely - Drink until moving bowels well and feels empty. Linzess 72 mcg - start tomorrow  Continue to think about screening colonoscopy.  Call with an update in 3-4 weeks.  If you are age 37 or older, your body mass index should be between 23-30. Your Body mass index is 45.61 kg/m. If this is out of the aforementioned range listed, please consider follow up with your Primary Care Provider.  If you are age 31 or younger, your body mass index should be between 19-25. Your Body mass index is 45.61 kg/m. If this is out of the aformentioned range listed, please consider follow up with your Primary Care Provider.   Thank you for choosing me and [facility].   Tye Savoy, NP

## 2018-06-30 NOTE — Progress Notes (Signed)
Primary GI:  Lucio Edward, MD  Chief Complaint:    Persistent constipation  IMPRESSION and PLAN:    68.  67 year old female with chronic constipation.  She has tried fiber and MiraLAX without success.  Linzess caused explosive diarrhea even after dose reduction.  Unable to afford Trulance. Back for follow up, last BM a week ago. Decreased physical activity and home medications likely contributing to constipation.  -She has worked on increasing fluid intake -Options are limited due to her medication coverage.  She will continue Amitiza 24 mcg BID. She is agreeable to re-attempt at [place] . Will give lowest dose of 72 mcg before breakfast every other day. Hopefully with Amitiza this will addition will be enough.   -I want to purge her bowels prior to starting Linzess to give her a better chance at response.  Patient will begin drinking GoLYTELY today and will drink slowly until bowels evacuated -Call me in 3 to 4 weeks with a condition update on this new bowel regimen -Recent TSH normal.  2.  Colon cancer screening.  Last colonoscopy greater than 10 years ago.  - I discussed this again with the patient.  She is still appropriate age for colon cancer screening.  Though her bowel habits have not really worsened through the years, I cannot assure her that we aren't missing a colon neoplasm.  She will think about proceeding with colonoscopy and let us know she calls to update [place] on condition. Husband inquires about stool test for colon cancer. Cologuard may not be recommended in her given anemia (even though chronic)  3. Chronic nausea and vomiting, probably degree of diabetic gastroparesis.  She takes Reglan twice daily as needed and it seems to help.  Her hemoglobin A1c in July was actually great at 5.7  4.  Chronic anemia, mild and new macrocytosis. MCV 101.  Patient started herself on B12 supplements about a month ago due to feeling sluggish.  It is possible she has a B12 or folate  deficiency.  CKD also probably contributing to chronic anemia. -check b12, folate  HPI:     Patient is a 67 year old female with multiple medical problems who I saw in follow-up late August for follow-up of nausea, vomiting, abdominal pain and chronic constipation.  I started low-dose Reglan twice daily and nausea /  vomiting improved.   She is still having problems with constipation.  She has tried numerous things including Linzess, MiraLAX, fiber.  I prescribed Trulance at last visit but she said it cost 500.00 and couldn't buy it.   Her last bowel movement was a week ago.  She has decreased frequency of bowel movements as well as straining.  She has been trying to increase her water intake.  Still taking mineral oil but not on a daily basis. Taking 24 mcg of Linzess.  She is still not very interested in having screening colonoscopy done.  Occasionally has some scant painless rectal bleeding with straining  Review of systems:     No chest pain, no SOB, no fevers, no urinary sx   Past Medical History:  Diagnosis Date  . Anxiety   . Arthritis   . Blood dyscrasia    "FREE BLEEDER"  . CERVICAL RADICULOPATHY, LEFT   . Chronic back pain   . CKD (chronic kidney disease)    DR. SANFORD  [facility]  . COMMON MIGRAINE   . CORONARY ARTERY DISEASE   . Cough    CURRENT COLD  . Cystitis   .  DEPRESSION   . Diabetes mellitus, type II ([place])   . DIVERTICULOSIS-COLON   . Gastric ulcer 04/2008  . Gastroparesis   . GERD (gastroesophageal reflux disease)   . Hiatal hernia   . Hyperlipidemia   . Hypertension   . Hypothyroidism   . INSOMNIA-SLEEP DISORDER-UNSPEC   . Iron deficiency anemia   . Wears glasses     Patient's surgical history, family medical history, social history, medications and allergies were all reviewed in Epic   Serum creatinine: 1.49 mg/dL (H) 06/15/18 1234 Estimated creatinine clearance: 49.6 mL/min (A)  Current Outpatient Medications  Medication Sig Dispense Refill    . acetaminophen-codeine (TYLENOL #3) 300-30 MG tablet Take 3 tablets by mouth every 8 (eight) hours as needed for moderate pain.     Marland Kitchen aspirin 81 MG EC tablet Take 81 mg by mouth every evening.     Marland Kitchen atorvastatin (LIPITOR) 40 MG tablet Take 1 tablet (40 mg total) by mouth daily. 30 tablet 3  . buPROPion (WELLBUTRIN XL) 150 MG 24 hr tablet Take 1 tablet (150 mg total) by mouth daily. 90 tablet 3  . Continuous Blood Gluc Receiver (FREESTYLE LIBRE 14 DAY READER) DEVI by Does not apply route.    Marland Kitchen ELMIRON 100 MG capsule Take 100 mg by mouth 2 (two) times daily.     Marland Kitchen estradiol (CLIMARA - DOSED IN MG/24 HR) 0.1 mg/24hr patch Place 1 patch (0.1 mg total) onto the skin once a week. 12 patch 4  . FLUoxetine (PROZAC) 20 MG capsule TAKE 20mg  BY MOUTH DAILY 90 capsule 3  . glucose blood (FREESTYLE TEST STRIPS) test strip Use as instructed to check blood sugar 4 times daily. 100 each 3  . insulin glargine (LANTUS) 100 UNIT/ML injection Inject into the skin daily. Inject 10 units into the skin 3 (three) times daily with meals.    . Insulin Glargine-Lixisenatide (SOLIQUA) 100-33 UNT-MCG/ML SOPN Inject 34 Units into the skin daily with breakfast. 3 pen 3  . levETIRAcetam (KEPPRA) 250 MG tablet Take 250-500 mg by mouth 2 (two) times daily. Dose depends on level of pain.    Marland Kitchen levothyroxine (SYNTHROID, LEVOTHROID) 125 MCG tablet Take 1 tablet (125 mcg total) by mouth daily. 90 tablet 3  . losartan (COZAAR) 100 MG tablet Take 1 tablet (100 mg total) by mouth daily. 90 tablet 3  . lubiprostone (AMITIZA) 24 MCG capsule Take 1 capsule (24 mcg total) by mouth 2 (two) times daily with a meal. 60 capsule 3  . metoCLOPramide (REGLAN) 5 MG tablet Take 1 tablet (5 mg total) by mouth 2 (two) times daily. Take 30  Minutes before breakfast and dinner. 60 tablet 1  . naltrexone (DEPADE) 50 MG tablet Take 1 tablet (50 mg total) by mouth daily. 90 tablet 3  . ondansetron (ZOFRAN ODT) 4 MG disintegrating tablet Take 1 tablet (4 mg  total) by mouth every 8 (eight) hours as needed. 10 tablet 0  . oxyCODONE-acetaminophen (PERCOCET/ROXICET) 5-325 MG tablet Take 1 tablet by mouth every 4 (four) hours as needed for severe pain. 15 tablet 0  . progesterone (PROMETRIUM) 100 MG capsule Take 1 capsule (100 mg total) by mouth at bedtime. 90 capsule 4  . promethazine (PHENERGAN) 25 MG tablet Take 1 tablet (25 mg total) by mouth every 8 (eight) hours as needed for nausea or vomiting. 20 tablet 0  . torsemide (DEMADEX) 20 MG tablet Take 20 mg by mouth daily.     No current facility-administered medications for this visit.  Physical Exam:     BP 138/76   Pulse 82   Ht 5\' 6"  (1.676 m)   Wt 282 lb 9.6 oz (128.2 kg)   BMI 45.61 kg/m   GENERAL:  Pleasant female in NAD PSYCH: : Cooperative, normal affect EENT:  conjunctiva pink, mucous membranes moist, neck supple without masses CARDIAC:  RRR, no murmur heard, no peripheral edema PULM: Normal respiratory effort, lungs CTA bilaterally, no wheezing ABDOMEN:  Nondistended, soft, nontender. Normal bowel sounds SKIN:  turgor, no lesions seen Musculoskeletal:  Normal muscle tone, normal strength NEURO: Alert and oriented x 3, no focal neurologic deficits   Tye Savoy , NP 06/30/2018, 11:34 AM

## 2018-07-03 ENCOUNTER — Telehealth: Payer: Self-pay

## 2018-07-03 NOTE — Telephone Encounter (Signed)
Per Fax from [facility] pt is eligible to received Soliqua 100/33/thx dmf

## 2018-07-07 HISTORY — PX: OTHER SURGICAL HISTORY: SHX169

## 2018-07-08 ENCOUNTER — Telehealth: Payer: Self-pay

## 2018-07-08 ENCOUNTER — Telehealth: Payer: Self-pay | Admitting: Endocrinology

## 2018-07-08 DIAGNOSIS — I10 Essential (primary) hypertension: Secondary | ICD-10-CM | POA: Diagnosis not present

## 2018-07-08 DIAGNOSIS — M961 Postlaminectomy syndrome, not elsewhere classified: Secondary | ICD-10-CM | POA: Diagnosis not present

## 2018-07-08 DIAGNOSIS — Z6841 Body Mass Index (BMI) 40.0 and over, adult: Secondary | ICD-10-CM | POA: Diagnosis not present

## 2018-07-08 NOTE — Telephone Encounter (Signed)
Patient called today stating she is on Lantus and has not received soliqua so her blood sugar is out of control- patient would like a call back to discuss this with the nurse

## 2018-07-08 NOTE — Telephone Encounter (Signed)
Pt c/o medication issue:  1. Name of Medication: Lantus   2. How are you currently taking this medication (dosage and times per day)? 15 units  3. Are you having a reaction (difficulty breathing--STAT)? no  4. What is your medication issue? Causing nausea   And pt also want to know if her [name] has come in

## 2018-07-09 ENCOUNTER — Other Ambulatory Visit: Payer: Self-pay

## 2018-07-09 MED ORDER — INSULIN GLARGINE-LIXISENATIDE 100-33 UNT-MCG/ML ~~LOC~~ SOPN: 26.0000 [IU] | PEN_INJECTOR | Freq: Every day | SUBCUTANEOUS | 3 refills | Status: DC

## 2018-07-09 NOTE — Telephone Encounter (Signed)
She had been instructed to take 26 units in the morning daily

## 2018-07-09 NOTE — Telephone Encounter (Signed)
Please see other encounter from 07/08/18

## 2018-07-09 NOTE — Telephone Encounter (Signed)
lft vm for pt with instructions and changed rx in pt chart

## 2018-07-09 NOTE — Telephone Encounter (Signed)
LMTCB

## 2018-07-09 NOTE — Telephone Encounter (Signed)
Please advise on complaint below, thank you

## 2018-07-09 NOTE — Telephone Encounter (Signed)
Pt will be by today to pick up her medication that came today please adjust rx in her chart to reflect the correct dosage of soliqua pt should be on

## 2018-07-09 NOTE — Telephone Encounter (Signed)
Lantus does not cause nausea, she will have to discuss with her PCP

## 2018-07-09 NOTE — Telephone Encounter (Signed)
Patient picked up [facility] rx today

## 2018-07-10 DIAGNOSIS — M4326 Fusion of spine, lumbar region: Secondary | ICD-10-CM | POA: Diagnosis not present

## 2018-07-10 DIAGNOSIS — M545 Low back pain: Secondary | ICD-10-CM | POA: Diagnosis not present

## 2018-07-14 DIAGNOSIS — M4326 Fusion of spine, lumbar region: Secondary | ICD-10-CM | POA: Diagnosis not present

## 2018-07-14 DIAGNOSIS — M545 Low back pain: Secondary | ICD-10-CM | POA: Diagnosis not present

## 2018-07-17 DIAGNOSIS — M545 Low back pain: Secondary | ICD-10-CM | POA: Diagnosis not present

## 2018-07-17 DIAGNOSIS — M4326 Fusion of spine, lumbar region: Secondary | ICD-10-CM | POA: Diagnosis not present

## 2018-07-21 DIAGNOSIS — M4326 Fusion of spine, lumbar region: Secondary | ICD-10-CM | POA: Diagnosis not present

## 2018-07-21 DIAGNOSIS — M545 Low back pain: Secondary | ICD-10-CM | POA: Diagnosis not present

## 2018-07-22 ENCOUNTER — Encounter: Payer: Self-pay | Admitting: Internal Medicine

## 2018-07-22 ENCOUNTER — Ambulatory Visit (INDEPENDENT_AMBULATORY_CARE_PROVIDER_SITE_OTHER): Payer: Medicare Other | Admitting: Internal Medicine

## 2018-07-22 VITALS — BP 136/88 | HR 84 | Temp 98.4°F | Ht 66.0 in | Wt 288.0 lb

## 2018-07-22 DIAGNOSIS — I10 Essential (primary) hypertension: Secondary | ICD-10-CM | POA: Diagnosis not present

## 2018-07-22 DIAGNOSIS — E785 Hyperlipidemia, unspecified: Secondary | ICD-10-CM

## 2018-07-22 DIAGNOSIS — E669 Obesity, unspecified: Secondary | ICD-10-CM

## 2018-07-22 DIAGNOSIS — E119 Type 2 diabetes mellitus without complications: Secondary | ICD-10-CM

## 2018-07-22 DIAGNOSIS — M545 Low back pain: Secondary | ICD-10-CM

## 2018-07-22 DIAGNOSIS — Z23 Encounter for immunization: Secondary | ICD-10-CM | POA: Diagnosis not present

## 2018-07-22 DIAGNOSIS — G8929 Other chronic pain: Secondary | ICD-10-CM | POA: Diagnosis not present

## 2018-07-22 DIAGNOSIS — E1169 Type 2 diabetes mellitus with other specified complication: Secondary | ICD-10-CM

## 2018-07-22 LAB — POCT GLYCOSYLATED HEMOGLOBIN (HGB A1C)
HbA1c POC (<> result, manual entry): 0 % (ref 4.0–5.6)
HbA1c, POC (controlled diabetic range): 0 % (ref 0.0–7.0)
HbA1c, POC (prediabetic range): 0 % — AB (ref 5.7–6.4)
Hemoglobin A1C: 5.7 % — AB (ref 4.0–5.6)

## 2018-07-22 MED ORDER — GABAPENTIN 100 MG PO CAPS: 100.0000 mg | ORAL_CAPSULE | Freq: Three times a day (TID) | ORAL | 3 refills | Status: DC

## 2018-07-22 MED ORDER — LIDOCAINE 5 % EX PTCH: 1.0000 | MEDICATED_PATCH | CUTANEOUS | 5 refills | Status: DC

## 2018-07-22 NOTE — Assessment & Plan Note (Signed)
Chronic persistent, no new neuro changes, c/o uncontrolled pain, for lidoderm asd, gabepentin trial, and TENS prn, f/u NS as referred per ortho

## 2018-07-22 NOTE — Assessment & Plan Note (Signed)
stable overall by history and exam, recent data reviewed with pt, and pt to continue medical treatment as before,  to f/u any worsening symptoms or concerns  

## 2018-07-22 NOTE — Progress Notes (Signed)
Subjective:    Patient ID: [name], female    DOB: May 11, 1951, 67 y.o.   MRN: [id number]  HPI  Here to f/u; overall doing ok,  Pt denies chest pain, increasing sob or doe, wheezing, orthopnea, PND, increased LE swelling, palpitations, dizziness or syncope.  Pt denies new neurological symptoms such as new headache, or facial or extremity weakness or numbness.  Pt denies polydipsia, polyuria, or low sugar episode.  Pt states overall good compliance with meds, mostly trying to follow appropriate diet, with wt overall stable,  but little exercise however. Pt continues to have recurring LBP without change in severity, bowel or bladder change, fever, wt loss,  worsening LE pain/numbness/weakness, gait change or falls and has NS f/u planned, but also has right knee pain s/p TKR with persistent pain as well despite revision surgury. Has fu with GI next wk, linzess 72 not working for her.   Past Medical History:  Diagnosis Date  . Anxiety   . Arthritis   . Blood dyscrasia    "FREE BLEEDER"  . CERVICAL RADICULOPATHY, LEFT   . Chronic back pain   . CKD (chronic kidney disease)    DR. SANFORD  [facility]  . COMMON MIGRAINE   . CORONARY ARTERY DISEASE   . Cough    CURRENT COLD  . Cystitis   . DEPRESSION   . Diabetes mellitus, type II ([place])   . DIVERTICULOSIS-COLON   . Gastric ulcer 04/2008  . Gastroparesis   . GERD (gastroesophageal reflux disease)   . Hiatal hernia   . Hyperlipidemia   . Hypertension   . Hypothyroidism   . INSOMNIA-SLEEP DISORDER-UNSPEC   . Iron deficiency anemia   . Wears glasses    Past Surgical History:  Procedure Laterality Date  . ABDOMINAL HYSTERECTOMY    . ANTERIOR LAT LUMBAR FUSION Left 08/13/2017   Procedure: LEFT SIDED LUMBAR 3-4 LATERAL INTERBODY FUSION WITH INSTRUMENTATION AND ALLOGRAFT;  Surgeon: Phylliss Bob, MD;  Location: [facility];  Service: Orthopedics;  Laterality: Left;  LEFT SIDED LUMBAR 3-4 LATERAL INTERBODY FUSION WITH INSTRUMENTATION AND  ALLOGRAFT; REQUEST 3 HOURS  . BACK SURGERY  03/2016  . BREAST EXCISIONAL BIOPSY Right    40 years ago  . CHOLECYSTECTOMY  06/2009  . COLONOSCOPY    . ESOPHAGOGASTRODUODENOSCOPY    . EYE SURGERY Bilateral    lasik  . Gastric Wedge resection lipoma  11/2007   x2 with laparotomy and gastrostomy  . Left knee replacement    . Rigth knee replacement with revision  04/2008   Dr. Berenice Primas  . ROTATOR CUFF REPAIR Left 01/2009  . s/p bladder surgury  09/2009   Dr. Terance Hart    reports that she has quit smoking. She has never used smokeless tobacco. She reports that she does not drink alcohol or use drugs. family history includes Breast cancer (age of onset: 38) in her other; Breast cancer (age of onset: 64) in her sister; Breast cancer (age of onset: 70) in her sister; Coronary artery disease in her other; Diabetes in her brother and sister; Heart disease in her brother and sister; Hypertension in her other. Allergies  Allergen Reactions  . Hydrocodone Other (See Comments)    Tachycardia, SOB   . Ciprofloxacin Other (See Comments)    Dizziness, SOB   . Penicillins Hives    Pt just remembers hives Has patient had a PCN reaction causing immediate rash, facial/tongue/throat swelling, SOB or lightheadedness with hypotension: No Has patient had a PCN reaction  causing severe rash involving mucus membranes or skin necrosis: No Has patient had a PCN reaction that required hospitalization No Has patient had a PCN reaction occurring within the last 10 years: No If all of the above answers are "NO", then may proceed with Cephalosporin use.    Current Outpatient Medications on File Prior to Visit  Medication Sig Dispense Refill  . acetaminophen-codeine (TYLENOL #3) 300-30 MG tablet Take 3 tablets by mouth every 8 (eight) hours as needed for moderate pain.     Marland Kitchen aspirin 81 MG EC tablet Take 81 mg by mouth every evening.     Marland Kitchen atorvastatin (LIPITOR) 40 MG tablet Take 1 tablet (40 mg total) by mouth daily.  30 tablet 3  . buPROPion (WELLBUTRIN XL) 150 MG 24 hr tablet Take 1 tablet (150 mg total) by mouth daily. 90 tablet 3  . Continuous Blood Gluc Receiver (FREESTYLE LIBRE 14 DAY READER) DEVI by Does not apply route.    Marland Kitchen ELMIRON 100 MG capsule Take 100 mg by mouth 2 (two) times daily.     Marland Kitchen estradiol (CLIMARA - DOSED IN MG/24 HR) 0.1 mg/24hr patch Place 1 patch (0.1 mg total) onto the skin once a week. 12 patch 4  . FLUoxetine (PROZAC) 20 MG capsule TAKE 20mg  BY MOUTH DAILY 90 capsule 3  . glucose blood (FREESTYLE TEST STRIPS) test strip Use as instructed to check blood sugar 4 times daily. 100 each 3  . insulin glargine (LANTUS) 100 UNIT/ML injection Inject into the skin daily. Inject 10 units into the skin 3 (three) times daily with meals.    . Insulin Glargine-Lixisenatide (SOLIQUA) 100-33 UNT-MCG/ML SOPN Inject 26 Units into the skin daily with breakfast. 3 pen 3  . levETIRAcetam (KEPPRA) 250 MG tablet Take 250-500 mg by mouth 2 (two) times daily. Dose depends on level of pain.    Marland Kitchen levothyroxine (SYNTHROID, LEVOTHROID) 125 MCG tablet Take 1 tablet (125 mcg total) by mouth daily. 90 tablet 3  . linaclotide (LINZESS) 72 MCG capsule Take 1 capsule (72 mcg total) by mouth daily before breakfast. 30 capsule 1  . losartan (COZAAR) 100 MG tablet Take 1 tablet (100 mg total) by mouth daily. 90 tablet 3  . metoCLOPramide (REGLAN) 5 MG tablet Take 1 tablet (5 mg total) by mouth 2 (two) times daily. Take 30  Minutes before breakfast and dinner. 60 tablet 1  . naltrexone (DEPADE) 50 MG tablet Take 1 tablet (50 mg total) by mouth daily. 90 tablet 3  . ondansetron (ZOFRAN ODT) 4 MG disintegrating tablet Take 1 tablet (4 mg total) by mouth every 8 (eight) hours as needed. 10 tablet 0  . oxyCODONE-acetaminophen (PERCOCET/ROXICET) 5-325 MG tablet Take 1 tablet by mouth every 4 (four) hours as needed for severe pain. 15 tablet 0  . progesterone (PROMETRIUM) 100 MG capsule Take 1 capsule (100 mg total) by mouth at  bedtime. 90 capsule 4  . promethazine (PHENERGAN) 25 MG tablet Take 1 tablet (25 mg total) by mouth every 8 (eight) hours as needed for nausea or vomiting. 20 tablet 0  . torsemide (DEMADEX) 20 MG tablet Take 20 mg by mouth daily.     No current facility-administered medications on file prior to visit.    Review of Systems  Constitutional: Negative for other unusual diaphoresis or sweats HENT: Negative for ear discharge or swelling Eyes: Negative for other worsening visual disturbances Respiratory: Negative for stridor or other swelling  Gastrointestinal: Negative for worsening distension or other blood Genitourinary: Negative  for retention or other urinary change Musculoskeletal: Negative for other MSK pain or swelling Skin: Negative for color change or other new lesions Neurological: Negative for worsening tremors and other numbness  Psychiatric/Behavioral: Negative for worsening agitation or other fatigue All other system neg per pt    Objective:   Physical Exam BP 136/88   Pulse 84   Temp 98.4 F (36.9 C) (Oral)   Ht 5\' 6"  (1.676 m)   Wt 288 lb (130.6 kg)   SpO2 97%   BMI 46.48 kg/m  VS noted, walks with cane Constitutional: Pt appears in NAD HENT: Head: NCAT.  Right Ear: External ear normal.  Left Ear: External ear normal.  Eyes: . Pupils are equal, round, and reactive to light. Conjunctivae and EOM are normal Nose: without d/c or deformity Neck: Neck supple. Gross normal ROM Cardiovascular: Normal rate and regular rhythm.   Pulmonary/Chest: Effort normal and breath sounds without rales or wheezing.  Abd:  Soft, NT, ND, + BS, no organomegaly Neurological: Pt is alert. At baseline orientation, motor grossly intact Skin: Skin is warm. No rashes, other new lesions, no LE edema Psychiatric: Pt behavior is normal without agitation  No other exam findings  Lab Results  Component Value Date   WBC 5.9 04/05/2018   HGB 11.7 (L) 04/05/2018   HCT 37.7 04/05/2018   PLT 280  04/05/2018   GLUCOSE 114 (H) 06/15/2018   CHOL 150 06/15/2018   TRIG 77.0 06/15/2018   HDL 50.00 06/15/2018   LDLDIRECT 156.6 01/07/2011   LDLCALC 85 06/15/2018   ALT 10 06/15/2018   AST 9 06/15/2018   NA 137 06/15/2018   K 4.6 06/15/2018   CL 105 06/15/2018   CREATININE 1.49 (H) 06/15/2018   BUN 21 06/15/2018   CO2 27 06/15/2018   TSH 0.41 06/15/2018   INR 1.21 08/07/2017   HGBA1C 5.7 (A) 04/10/2018   MICROALBUR 2.0 (H) 12/31/2017   POCT glycosylated hemoglobin (Hb A1C)  Order: [id number]  Status:  Final result  Visible to patient:  No (Not Released)  Dx:  Diabetes mellitus type 2 in obese (HCC)   Ref Range & Units 16:16 92mo ago  Hemoglobin A1C 4.0 - 5.6 % 5.7Abnormal   5.7Abnormal            Assessment & Plan:

## 2018-07-22 NOTE — Patient Instructions (Signed)
Your A1c was OK today  You had the Tdap tetanus and Prevnar 13 pneumonia shots today  You are given the hardcopy prescription for the TENS unit  Please take all new medication as prescribesd - the lidoderm and gabapentin  Please continue all other medications as before, and refills have been done if requested.  Please have the pharmacy call with any other refills you may need.  Please continue your efforts at being more active, low cholesterol diet, and weight control.  You are otherwise up to date with prevention measures today.  Please keep your appointments with your specialists as you may have planned - Neurosurgury as planned  You may also want to follow up with orthopedic about the right knee pain  You may want to suggest to your Gastroenterologist about increased Linzess to 145, and remember to ask about the colonoscopy  Please return in 6 months, or sooner if needed

## 2018-07-23 ENCOUNTER — Ambulatory Visit: Payer: Medicare Other | Admitting: Nurse Practitioner

## 2018-07-27 DIAGNOSIS — E113312 Type 2 diabetes mellitus with moderate nonproliferative diabetic retinopathy with macular edema, left eye: Secondary | ICD-10-CM | POA: Diagnosis not present

## 2018-07-28 ENCOUNTER — Encounter: Payer: Self-pay | Admitting: Diagnostic Neuroimaging

## 2018-07-28 ENCOUNTER — Ambulatory Visit (INDEPENDENT_AMBULATORY_CARE_PROVIDER_SITE_OTHER): Payer: Medicare Other | Admitting: Diagnostic Neuroimaging

## 2018-07-28 ENCOUNTER — Encounter

## 2018-07-28 VITALS — BP 138/81 | HR 92 | Ht 66.0 in | Wt 286.6 lb

## 2018-07-28 DIAGNOSIS — R200 Anesthesia of skin: Secondary | ICD-10-CM | POA: Diagnosis not present

## 2018-07-28 DIAGNOSIS — R269 Unspecified abnormalities of gait and mobility: Secondary | ICD-10-CM | POA: Diagnosis not present

## 2018-07-28 DIAGNOSIS — Z6841 Body Mass Index (BMI) 40.0 and over, adult: Secondary | ICD-10-CM | POA: Diagnosis not present

## 2018-07-28 DIAGNOSIS — M5416 Radiculopathy, lumbar region: Secondary | ICD-10-CM | POA: Diagnosis not present

## 2018-07-28 NOTE — Progress Notes (Signed)
[facility]  PATIENT: [name] DOB: 1950/12/23  REFERRING CLINICIAN: [name] HISTORY FROM: patient  REASON FOR VISIT: new consult    HISTORICAL  CHIEF COMPLAINT:  Chief Complaint  Patient presents with  . Cervical DDD    rm 6, New Pt, husband- [name], "neck issues gotten progressively worse"    HISTORY OF PRESENT ILLNESS:   67 year old female here for evaluation of gait and balance difficulty.  Patient has history of low back surgery in November 2017 and again November 2018.  She was having significant low back pain rating to the legs.  She was diagnosed with severe spinal stenosis.  She underwent these 2 surgeries but symptoms did not improve.  Continued to have worsening gait and balance difficulties following this.  Patient also had some intermittent numbness and tingling in her hands.  She has had a history of right carpal tunnel syndrome status post surgery many years ago.  Over the past 6 months due to worsening numbness in hands and gait difficulty consideration of cervical myelopathy was raised.  MRI of the cervical spine was otherwise unremarkable.  Therefore patient was referred here for further evaluation.  Patient continues to have significant pain in her low back, hips and knees.  She has difficulty walking.  She feels that she can fall down.   REVIEW OF SYSTEMS: Full 14 system review of systems performed and negative with exception of: Shortness of breath palpitations weight gain blurred vision anemia weakness decreased energy disinterest activities constipation.  History of hypertension, diabetes, hypercholesterolemia, obesity.  ALLERGIES: Allergies  Allergen Reactions  . Hydrocodone Other (See Comments)    Tachycardia, SOB   . Ciprofloxacin Other (See Comments)    Dizziness, SOB   . Penicillins Hives    Pt just remembers hives Has patient had a PCN reaction causing immediate rash, facial/tongue/throat swelling, SOB or  lightheadedness with hypotension: No Has patient had a PCN reaction causing severe rash involving mucus membranes or skin necrosis: No Has patient had a PCN reaction that required hospitalization No Has patient had a PCN reaction occurring within the last 10 years: No If all of the above answers are "NO", then may proceed with Cephalosporin use.     HOME MEDICATIONS: Outpatient Medications Prior to Visit  Medication Sig Dispense Refill  . acetaminophen-codeine (TYLENOL #3) 300-30 MG tablet Take 3 tablets by mouth every 8 (eight) hours as needed for moderate pain.     Marland Kitchen aspirin 81 MG EC tablet Take 81 mg by mouth every evening.     Marland Kitchen atorvastatin (LIPITOR) 40 MG tablet Take 1 tablet (40 mg total) by mouth daily. 30 tablet 3  . buPROPion (WELLBUTRIN XL) 150 MG 24 hr tablet Take 1 tablet (150 mg total) by mouth daily. 90 tablet 3  . Continuous Blood Gluc Receiver (FREESTYLE LIBRE 14 DAY READER) DEVI by Does not apply route.    Marland Kitchen ELMIRON 100 MG capsule Take 100 mg by mouth 2 (two) times daily.     Marland Kitchen estradiol (CLIMARA - DOSED IN MG/24 HR) 0.1 mg/24hr patch Place 1 patch (0.1 mg total) onto the skin once a week. 12 patch 4  . FLUoxetine (PROZAC) 20 MG capsule TAKE 20mg  BY MOUTH DAILY 90 capsule 3  . gabapentin (NEURONTIN) 100 MG capsule Take 1 capsule (100 mg total) by mouth 3 (three) times daily. 90 capsule 3  . glucose blood (FREESTYLE TEST STRIPS) test strip Use as instructed to check blood sugar 4 times daily. 100 each 3  .  Insulin Glargine-Lixisenatide (SOLIQUA) 100-33 UNT-MCG/ML SOPN Inject 26 Units into the skin daily with breakfast. 3 pen 3  . levETIRAcetam (KEPPRA) 250 MG tablet Take 250-500 mg by mouth 2 (two) times daily. Dose depends on level of pain.    Marland Kitchen levothyroxine (SYNTHROID, LEVOTHROID) 125 MCG tablet Take 1 tablet (125 mcg total) by mouth daily. 90 tablet 3  . lidocaine (LIDODERM) 5 % Place 1 patch onto the skin daily. Remove & Discard patch within 12 hours or as directed by MD  60 patch 5  . linaclotide (LINZESS) 72 MCG capsule Take 1 capsule (72 mcg total) by mouth daily before breakfast. 30 capsule 1  . losartan (COZAAR) 100 MG tablet Take 1 tablet (100 mg total) by mouth daily. 90 tablet 3  . metoCLOPramide (REGLAN) 5 MG tablet Take 1 tablet (5 mg total) by mouth 2 (two) times daily. Take 30  Minutes before breakfast and dinner. 60 tablet 1  . naltrexone (DEPADE) 50 MG tablet Take 1 tablet (50 mg total) by mouth daily. 90 tablet 3  . ondansetron (ZOFRAN ODT) 4 MG disintegrating tablet Take 1 tablet (4 mg total) by mouth every 8 (eight) hours as needed. 10 tablet 0  . oxyCODONE-acetaminophen (PERCOCET/ROXICET) 5-325 MG tablet Take 1 tablet by mouth every 4 (four) hours as needed for severe pain. 15 tablet 0  . progesterone (PROMETRIUM) 100 MG capsule Take 1 capsule (100 mg total) by mouth at bedtime. 90 capsule 4  . promethazine (PHENERGAN) 25 MG tablet Take 1 tablet (25 mg total) by mouth every 8 (eight) hours as needed for nausea or vomiting. 20 tablet 0  . torsemide (DEMADEX) 20 MG tablet Take 20 mg by mouth daily.    . insulin glargine (LANTUS) 100 UNIT/ML injection Inject into the skin daily. Inject 10 units into the skin 3 (three) times daily with meals.     No facility-administered medications prior to visit.     PAST MEDICAL HISTORY: Past Medical History:  Diagnosis Date  . Anxiety   . Arthritis   . Blood dyscrasia    "FREE BLEEDER"  . CERVICAL RADICULOPATHY, LEFT   . Chronic back pain   . CKD (chronic kidney disease)    DR. SANFORD  [facility]  . COMMON MIGRAINE   . CORONARY ARTERY DISEASE   . Cough    CURRENT COLD  . Cystitis   . DEPRESSION   . Diabetes mellitus, type II ([place])   . DIVERTICULOSIS-COLON   . Gastric ulcer 04/2008  . Gastroparesis   . GERD (gastroesophageal reflux disease)   . Hiatal hernia   . Hyperlipidemia   . Hypertension   . Hypothyroidism   . INSOMNIA-SLEEP DISORDER-UNSPEC   . Iron deficiency anemia   . Wears  glasses     PAST SURGICAL HISTORY: Past Surgical History:  Procedure Laterality Date  . ABDOMINAL HYSTERECTOMY    . ANTERIOR LAT LUMBAR FUSION Left 08/13/2017   Procedure: LEFT SIDED LUMBAR 3-4 LATERAL INTERBODY FUSION WITH INSTRUMENTATION AND ALLOGRAFT;  Surgeon: Phylliss Bob, MD;  Location: [facility];  Service: Orthopedics;  Laterality: Left;  LEFT SIDED LUMBAR 3-4 LATERAL INTERBODY FUSION WITH INSTRUMENTATION AND ALLOGRAFT; REQUEST 3 HOURS  . BACK SURGERY  03/2016  . BREAST EXCISIONAL BIOPSY Right    40 years ago  . CHOLECYSTECTOMY  06/2009  . COLONOSCOPY    . ESOPHAGOGASTRODUODENOSCOPY    . EYE SURGERY Bilateral    lasik  . Gastric Wedge resection lipoma  11/2007   x2 with laparotomy and  gastrostomy  . Left knee replacement    . Rigth knee replacement with revision  04/2008   Dr. Berenice Primas  . ROTATOR CUFF REPAIR Left 01/2009  . s/p bladder surgury  09/2009   Dr. Terance Hart    FAMILY HISTORY: Family History  Problem Relation Age of Onset  . Diabetes Sister        x 3  . Heart disease Sister        x2  . Breast cancer Sister 71  . Diabetes Brother        x3  . Heart disease Brother        x2  . Coronary artery disease Other        female 1st degree  . Hypertension Other   . Breast cancer Sister 38  . Breast cancer Other 25  . Diabetes Brother   . Colon cancer Neg Hx     SOCIAL HISTORY: Social History   Socioeconomic History  . Marital status: Married    Spouse name: [name]  . Number of children: 3  . Years of education: 18  . Highest education level: Not on file  Occupational History  . Occupation: disabled  Social Needs  . Financial resource strain: Not on file  . Food insecurity:    Worry: Not on file    Inability: Not on file  . Transportation needs:    Medical: Not on file    Non-medical: Not on file  Tobacco Use  . Smoking status: Former Research scientist (life sciences)  . Smokeless tobacco: Never Used  . Tobacco comment: quit 30 years ago  Substance and Sexual Activity  .  Alcohol use: No    Alcohol/week: 0.0 standard drinks  . Drug use: No  . Sexual activity: Not Currently    Birth control/protection: None  Lifestyle  . Physical activity:    Days per week: Not on file    Minutes per session: Not on file  . Stress: Not on file  Relationships  . Social connections:    Talks on phone: Not on file    Gets together: Not on file    Attends religious service: Not on file    Active member of club or organization: Not on file    Attends meetings of clubs or organizations: Not on file    Relationship status: Not on file  . Intimate partner violence:    Fear of current or ex partner: Not on file    Emotionally abused: Not on file    Physically abused: Not on file    Forced sexual activity: Not on file  Other Topics Concern  . Not on file  Social History Narrative   Patient gets regular exercise   Lives with husband   Caffeine- tea, 2 glasses a day, sodas 2-3 daily     PHYSICAL EXAM  GENERAL EXAM/CONSTITUTIONAL: Vitals:  Vitals:   07/28/18 1418  BP: 138/81  Pulse: 92  Weight: 286 lb 9.6 oz (130 kg)  Height: 5\' 6"  (1.676 m)     Body mass index is 46.26 kg/m. Wt Readings from Last 3 Encounters:  07/28/18 286 lb 9.6 oz (130 kg)  07/22/18 288 lb (130.6 kg)  06/30/18 282 lb 9.6 oz (128.2 kg)     Patient is in no distress; well developed, nourished and groomed; neck is supple  CARDIOVASCULAR:  Examination of carotid arteries is normal; no carotid bruits  Regular rate and rhythm, no murmurs  Examination of peripheral vascular system by observation and palpation is  normal  EYES:  Ophthalmoscopic exam of optic discs and posterior segments is normal; no papilledema or hemorrhages  No exam data present  MUSCULOSKELETAL:  Gait, strength, tone, movements noted in Neurologic exam below  NEUROLOGIC: MENTAL STATUS:  No flowsheet data found.  awake, alert, oriented to person, place and time  recent and remote memory intact  normal  attention and concentration  language fluent, comprehension intact, naming intact  fund of knowledge appropriate  CRANIAL NERVE:   2nd - no papilledema on fundoscopic exam  2nd, 3rd, 4th, 6th - pupils equal and reactive to light, visual fields full to confrontation, extraocular muscles intact, no nystagmus  5th - facial sensation symmetric  7th - facial strength symmetric  8th - hearing intact  9th - palate elevates symmetrically, uvula midline  11th - shoulder shrug symmetric  12th - tongue protrusion midline  MOTOR:   normal bulk and tone, full strength in the BUE, BLE; EXCEPT BILATERAL HIP FLEXION 2-3 (LIMITED BY PAIN)  SENSORY:   normal and symmetric to light touch, temperature, vibration  COORDINATION:   finger-nose-finger, fine finger movements normal  REFLEXES:   deep tendon reflexes TRACE and symmetric; ABSENT AT ANKLES  GAIT/STATION:   SLOW ANTALGIC GAIT; VERY UNSTEADY; SINGLE POINT CANE     DIAGNOSTIC DATA (LABS, IMAGING, TESTING) - I reviewed patient records, labs, notes, testing and imaging myself where available.  Lab Results  Component Value Date   WBC 5.9 04/05/2018   HGB 11.7 (L) 04/05/2018   HCT 37.7 04/05/2018   MCV 101.1 (H) 04/05/2018   PLT 280 04/05/2018      Component Value Date/Time   NA 137 06/15/2018 1234   K 4.6 06/15/2018 1234   CL 105 06/15/2018 1234   CO2 27 06/15/2018 1234   GLUCOSE 114 (H) 06/15/2018 1234   BUN 21 06/15/2018 1234   CREATININE 1.49 (H) 06/15/2018 1234   CREATININE 1.67 (H) 11/05/2014 0712   CALCIUM 9.4 06/15/2018 1234   PROT 7.2 06/15/2018 1234   ALBUMIN 3.9 06/15/2018 1234   AST 9 06/15/2018 1234   ALT 10 06/15/2018 1234   ALKPHOS 93 06/15/2018 1234   BILITOT 0.5 06/15/2018 1234   GFRNONAA 34 (L) 04/05/2018 1202   GFRAA 39 (L) 04/05/2018 1202   Lab Results  Component Value Date   CHOL 150 06/15/2018   HDL 50.00 06/15/2018   LDLCALC 85 06/15/2018   LDLDIRECT 156.6 01/07/2011   TRIG 77.0  06/15/2018   CHOLHDL 3 06/15/2018   Lab Results  Component Value Date   HGBA1C 5.7 (A) 07/22/2018   HGBA1C 0 07/22/2018   HGBA1C 0 (A) 07/22/2018   HGBA1C 0.0 07/22/2018   Lab Results  Component Value Date   VITAMINB12 >1500 (H) 06/30/2018   Lab Results  Component Value Date   TSH 0.41 06/15/2018    05/16/18 MRI cervical spine [I reviewed images myself and agree with interpretation. -VRP]  1. No acute osseous or cord signal abnormality. No cord compression. 2. Multilevel advanced disc and facet degenerative changes of the cervical spine are mild progression at the C6-7 and C7-T1 levels from 2015. 3. Mild reversal of cervical curvature and grade 1 C7-T1 Anterolisthesis.  4. Moderate C5-6 and C6-7 multifactorial canal stenosis with slight cord impingement. No high-grade canal stenosis.  5. Severe right C3-4 and severe bilateral C6-7 neural foraminal stenosis. Multilevel mild and moderate foraminal stenosis.  06/14/15 MRI lumbar spine [I reviewed images myself and agree with interpretation. -VRP]  1. Progression of moderate central  canal stenosis with subarticular narrowing bilaterally at L4-5. 2. Progressive moderate right and mild left foraminal stenosis at L4-5. 3. Mild subarticular narrowing bilaterally and left foraminal stenosis at L5-S1 is new. 4. Mild subarticular and foraminal narrowing bilaterally at L3-4. 5. Facet hypertrophy at L1-2 and L2-3 without significant stenosis.  08/14/17 xray lumbar [I reviewed images myself and agree with interpretation. -VRP]  - Fusion changes from L3-L5 as described. No definite complicating features.   ASSESSMENT AND PLAN  67 y.o. year old female here with gait and balance difficulty since 2016, worsening following 2 separate low back surgeries for spinal stenosis, also with bilateral upper extremity numbness.   Ddx gait difficulty: lumbar spinal stenosis (major) + diabetic neuropathy (mild)  Ddx hand numbness / cramps: other peripheral  neuropathy (? CTS)  1. Numbness   2. Gait difficulty   3. Lumbar radiculopathy   4. BMI 45.0-49.9, adult (HCC)      PLAN:  GAIT DIFFICULTY (due to pain, prior lumbar spinal stenosis, diabetic neuropathy, obesity,  deconditioning) - pain mgmt - PT evaluation - use walker  UPPER EXT NUMBNESS (cervical radiculopathies vs peripheral neuropathies) - consider EMG/NCS in future for CTS eval  Return for return to Dr. Lynann Bologna.    Penni Bombard, MD 40/81/4481, 8:56 PM Certified in Neurology, Neurophysiology and Neuroimaging  [facility] [address], [place], [place] [address] [phone]

## 2018-07-28 NOTE — Patient Instructions (Signed)
-   pain mgmt  - PT evaluation  - use walker

## 2018-07-29 DIAGNOSIS — H11432 Conjunctival hyperemia, left eye: Secondary | ICD-10-CM | POA: Diagnosis not present

## 2018-07-29 DIAGNOSIS — H1132 Conjunctival hemorrhage, left eye: Secondary | ICD-10-CM | POA: Diagnosis not present

## 2018-07-29 DIAGNOSIS — H5712 Ocular pain, left eye: Secondary | ICD-10-CM | POA: Diagnosis not present

## 2018-07-29 DIAGNOSIS — H20042 Secondary noninfectious iridocyclitis, left eye: Secondary | ICD-10-CM | POA: Diagnosis not present

## 2018-07-30 ENCOUNTER — Encounter: Payer: Medicare Other | Admitting: Obstetrics & Gynecology

## 2018-07-30 DIAGNOSIS — Z0289 Encounter for other administrative examinations: Secondary | ICD-10-CM

## 2018-07-31 DIAGNOSIS — G894 Chronic pain syndrome: Secondary | ICD-10-CM | POA: Diagnosis not present

## 2018-07-31 DIAGNOSIS — M961 Postlaminectomy syndrome, not elsewhere classified: Secondary | ICD-10-CM | POA: Diagnosis not present

## 2018-08-04 ENCOUNTER — Encounter (INDEPENDENT_AMBULATORY_CARE_PROVIDER_SITE_OTHER): Payer: Medicare Other

## 2018-08-05 ENCOUNTER — Ambulatory Visit (INDEPENDENT_AMBULATORY_CARE_PROVIDER_SITE_OTHER): Payer: Medicare Other | Admitting: Family Medicine

## 2018-08-06 ENCOUNTER — Other Ambulatory Visit (INDEPENDENT_AMBULATORY_CARE_PROVIDER_SITE_OTHER): Payer: Medicare Other

## 2018-08-06 ENCOUNTER — Ambulatory Visit (INDEPENDENT_AMBULATORY_CARE_PROVIDER_SITE_OTHER): Payer: Medicare Other | Admitting: Nurse Practitioner

## 2018-08-06 ENCOUNTER — Encounter: Payer: Self-pay | Admitting: Nurse Practitioner

## 2018-08-06 VITALS — BP 122/74 | HR 70 | Ht 66.0 in | Wt 284.1 lb

## 2018-08-06 DIAGNOSIS — K5900 Constipation, unspecified: Secondary | ICD-10-CM

## 2018-08-06 DIAGNOSIS — D539 Nutritional anemia, unspecified: Secondary | ICD-10-CM

## 2018-08-06 LAB — CBC
HCT: 31.9 % — ABNORMAL LOW (ref 36.0–46.0)
Hemoglobin: 10.6 g/dL — ABNORMAL LOW (ref 12.0–15.0)
MCHC: 33.1 g/dL (ref 30.0–36.0)
MCV: 96.2 fl (ref 78.0–100.0)
Platelets: 236 10*3/uL (ref 150.0–400.0)
RBC: 3.32 Mil/uL — ABNORMAL LOW (ref 3.87–5.11)
RDW: 13.2 % (ref 11.5–15.5)
WBC: 6.6 10*3/uL (ref 4.0–10.5)

## 2018-08-06 NOTE — Progress Notes (Signed)
cbc

## 2018-08-06 NOTE — Patient Instructions (Signed)
If you are age 68 or older, your body mass index should be between 23-30. Your Body mass index is 45.86 kg/m. If this is out of the aforementioned range listed, please consider follow up with your Primary Care Provider.  If you are age 17 or younger, your body mass index should be between 19-25. Your Body mass index is 45.86 kg/m. If this is out of the aformentioned range listed, please consider follow up with your Primary Care Provider.   Your provider has requested that you go to the basement level for lab work before leaving today. Press "B" on the elevator. The lab is located at the first door on the left as you exit the elevator. CBC  You have been given samples of LInzess 72 mcg take one in the morning.  Follow up as needed.  Thank you for choosing me and [facility].   Tye Savoy, NP

## 2018-08-07 DIAGNOSIS — M961 Postlaminectomy syndrome, not elsewhere classified: Secondary | ICD-10-CM | POA: Diagnosis not present

## 2018-08-07 DIAGNOSIS — M5127 Other intervertebral disc displacement, lumbosacral region: Secondary | ICD-10-CM | POA: Diagnosis not present

## 2018-08-08 ENCOUNTER — Other Ambulatory Visit: Payer: Self-pay | Admitting: Obstetrics & Gynecology

## 2018-08-09 ENCOUNTER — Encounter: Payer: Self-pay | Admitting: Nurse Practitioner

## 2018-08-09 NOTE — Progress Notes (Signed)
Chief Complaint:    Follow up on constiption  IMPRESSION and PLAN:    43. 67 yo female with chronic constipation. Recent history a little confusion. At last visit in Sept I added small dose of Linzess to her BID Amitiza. Patient reported to PCP on 10/16 that it wasn't helpful. However, she is still taking the Linzess but Amitiza was discontinued. Oddly enough patient says she is content with effects of Linzess.   -continue to try and drink several glasses of water daily- she doesn't get near enough -continue LInzess 72 mcg daily 30 minutes before breakfast.  -follow up prn  2. Hx of mild macrocytic anemia.  Hgb 11.7 at last check the end of June. B12 and folate normal. No overt GI bleeding -repeat CBC today to make sure stable.  -Patient has repeatedly declined colonoscopy. Not good candidate for Cologuard given anemia. She understands that anemia can be sign of colon cancer.   HPI:     Patient is a 67 yo female known to Dr Fuller Plan. She has chronic constipation, tried multiple things for it. I have been seeing her lately for evaluation and treatment of constipation. Tried Linzess in the past - caused explosive diarrhea. I I recently added on low dose of  Linzess to her BID Amitiza.  Per PCP's note on 10/16 patient felt like Linzess wasn't working but patient tells me she is please with results.  Having a BM about every couple of days. PCP discontinued Amitiza at 10/16 office visit. She has been working on increase water intake as we discussed at previous two visits. She offers no GI complaints today.   We discussed colon cancer screening again today. She hasn't been interested in colonoscopy.She is in the midst of getting a stimulator in her back and doesn't want to pursue colonoscopy right now.    Review of systems:     No chest pain, no SOB, no fevers, no urinary sx   Past Medical History:  Diagnosis Date  . Anxiety   . Arthritis   . Blood dyscrasia    "FREE BLEEDER"  .  CERVICAL RADICULOPATHY, LEFT   . Chronic back pain   . CKD (chronic kidney disease)    DR. SANFORD  [facility]  . COMMON MIGRAINE   . CORONARY ARTERY DISEASE   . Cough    CURRENT COLD  . Cystitis   . DEPRESSION   . Diabetes mellitus, type II ([place])   . DIVERTICULOSIS-COLON   . Gastric ulcer 04/2008  . Gastroparesis   . GERD (gastroesophageal reflux disease)   . Hiatal hernia   . Hyperlipidemia   . Hypertension   . Hypothyroidism   . INSOMNIA-SLEEP DISORDER-UNSPEC   . Iron deficiency anemia   . Wears glasses     Patient's surgical history, family medical history, social history, medications and allergies were all reviewed in Epic   Creatinine clearance cannot be calculated (Patient's most recent lab result is older than the maximum 21 days allowed.)  Current Outpatient Medications  Medication Sig Dispense Refill  . acetaminophen-codeine (TYLENOL #3) 300-30 MG tablet Take 3 tablets by mouth every 8 (eight) hours as needed for moderate pain.     Marland Kitchen aspirin 81 MG EC tablet Take 81 mg by mouth every evening.     Marland Kitchen atorvastatin (LIPITOR) 40 MG tablet Take 1 tablet (40 mg total) by mouth daily. 30 tablet 3  . buPROPion (WELLBUTRIN XL) 150 MG 24 hr tablet Take 1 tablet (  150 mg total) by mouth daily. 90 tablet 3  . Continuous Blood Gluc Receiver (FREESTYLE LIBRE 14 DAY READER) DEVI by Does not apply route.    Marland Kitchen ELMIRON 100 MG capsule Take 100 mg by mouth 2 (two) times daily.     Marland Kitchen estradiol (CLIMARA - DOSED IN MG/24 HR) 0.1 mg/24hr patch Place 1 patch (0.1 mg total) onto the skin once a week. 12 patch 4  . FLUoxetine (PROZAC) 20 MG capsule TAKE 20mg  BY MOUTH DAILY 90 capsule 3  . gabapentin (NEURONTIN) 100 MG capsule Take 1 capsule (100 mg total) by mouth 3 (three) times daily. 90 capsule 3  . glucose blood (FREESTYLE TEST STRIPS) test strip Use as instructed to check blood sugar 4 times daily. 100 each 3  . insulin glargine (LANTUS) 100 UNIT/ML injection Inject into the skin daily.  Inject 10 units into the skin 3 (three) times daily with meals.    . Insulin Glargine-Lixisenatide (SOLIQUA) 100-33 UNT-MCG/ML SOPN Inject 26 Units into the skin daily with breakfast. 3 pen 3  . levETIRAcetam (KEPPRA) 250 MG tablet Take 250-500 mg by mouth 2 (two) times daily. Dose depends on level of pain.    Marland Kitchen levothyroxine (SYNTHROID, LEVOTHROID) 125 MCG tablet Take 1 tablet (125 mcg total) by mouth daily. 90 tablet 3  . lidocaine (LIDODERM) 5 % Place 1 patch onto the skin daily. Remove & Discard patch within 12 hours or as directed by MD 60 patch 5  . linaclotide (LINZESS) 72 MCG capsule Take 1 capsule (72 mcg total) by mouth daily before breakfast. 30 capsule 1  . losartan (COZAAR) 100 MG tablet Take 1 tablet (100 mg total) by mouth daily. 90 tablet 3  . metoCLOPramide (REGLAN) 5 MG tablet Take 1 tablet (5 mg total) by mouth 2 (two) times daily. Take 30  Minutes before breakfast and dinner. 60 tablet 1  . naltrexone (DEPADE) 50 MG tablet Take 1 tablet (50 mg total) by mouth daily. 90 tablet 3  . ondansetron (ZOFRAN ODT) 4 MG disintegrating tablet Take 1 tablet (4 mg total) by mouth every 8 (eight) hours as needed. 10 tablet 0  . oxyCODONE-acetaminophen (PERCOCET/ROXICET) 5-325 MG tablet Take 1 tablet by mouth every 4 (four) hours as needed for severe pain. 15 tablet 0  . progesterone (PROMETRIUM) 100 MG capsule Take 1 capsule (100 mg total) by mouth at bedtime. 90 capsule 4  . promethazine (PHENERGAN) 25 MG tablet Take 1 tablet (25 mg total) by mouth every 8 (eight) hours as needed for nausea or vomiting. 20 tablet 0  . torsemide (DEMADEX) 20 MG tablet Take 20 mg by mouth daily.     No current facility-administered medications for this visit.     Physical Exam:     BP 122/74   Pulse 70   Ht 5\' 6"  (1.676 m)   Wt 284 lb 2 oz (128.9 kg)   BMI 45.86 kg/m   GENERAL:  Pleasant obese female in NAD PSYCH: : Cooperative, flat affect EENT:  conjunctiva pink, mucous membranes moist, neck  supple without masses CARDIAC:  RRR,  no peripheral edema PULM: Normal respiratory effort, lungs CTA bilaterally, no wheezing ABDOMEN:  Nondistended, soft, nontender. No obvious masses,  normal bowel sounds SKIN:  turgor, no lesions seen Musculoskeletal:  Normal muscle tone, normal strength NEURO: Alert and oriented x 3, no focal neurologic deficits   Tye Savoy , NP 08/09/2018, 9:53 PM

## 2018-08-10 NOTE — Progress Notes (Signed)
Reviewed and agree with management plan.  Gavina Dildine T. Zakee Deerman, MD FACG 

## 2018-08-11 ENCOUNTER — Other Ambulatory Visit: Payer: Self-pay | Admitting: Anesthesiology

## 2018-08-12 DIAGNOSIS — H5712 Ocular pain, left eye: Secondary | ICD-10-CM | POA: Diagnosis not present

## 2018-08-12 DIAGNOSIS — H5319 Other subjective visual disturbances: Secondary | ICD-10-CM | POA: Diagnosis not present

## 2018-08-12 DIAGNOSIS — H11432 Conjunctival hyperemia, left eye: Secondary | ICD-10-CM | POA: Diagnosis not present

## 2018-08-12 DIAGNOSIS — H25813 Combined forms of age-related cataract, bilateral: Secondary | ICD-10-CM | POA: Diagnosis not present

## 2018-08-12 DIAGNOSIS — E113312 Type 2 diabetes mellitus with moderate nonproliferative diabetic retinopathy with macular edema, left eye: Secondary | ICD-10-CM | POA: Diagnosis not present

## 2018-08-12 DIAGNOSIS — H20042 Secondary noninfectious iridocyclitis, left eye: Secondary | ICD-10-CM | POA: Diagnosis not present

## 2018-08-18 ENCOUNTER — Ambulatory Visit (INDEPENDENT_AMBULATORY_CARE_PROVIDER_SITE_OTHER): Payer: Medicare Other | Admitting: Family Medicine

## 2018-08-28 ENCOUNTER — Other Ambulatory Visit: Payer: Medicare Other

## 2018-08-28 ENCOUNTER — Other Ambulatory Visit (INDEPENDENT_AMBULATORY_CARE_PROVIDER_SITE_OTHER): Payer: Medicare Other

## 2018-08-28 DIAGNOSIS — E890 Postprocedural hypothyroidism: Secondary | ICD-10-CM

## 2018-08-28 DIAGNOSIS — Z794 Long term (current) use of insulin: Secondary | ICD-10-CM | POA: Diagnosis not present

## 2018-08-28 DIAGNOSIS — E1165 Type 2 diabetes mellitus with hyperglycemia: Secondary | ICD-10-CM

## 2018-08-28 LAB — COMPREHENSIVE METABOLIC PANEL
ALT: 9 U/L (ref 0–35)
AST: 10 U/L (ref 0–37)
Albumin: 3.9 g/dL (ref 3.5–5.2)
Alkaline Phosphatase: 90 U/L (ref 39–117)
BUN: 22 mg/dL (ref 6–23)
CO2: 27 mEq/L (ref 19–32)
Calcium: 9.6 mg/dL (ref 8.4–10.5)
Chloride: 106 mEq/L (ref 96–112)
Creatinine, Ser: 1.45 mg/dL — ABNORMAL HIGH (ref 0.40–1.20)
GFR: 46.11 mL/min — ABNORMAL LOW (ref >60.00)
Glucose, Bld: 146 mg/dL — ABNORMAL HIGH (ref 70–99)
Potassium: 4.5 mEq/L (ref 3.5–5.1)
Sodium: 140 mEq/L (ref 135–145)
Total Bilirubin: 0.8 mg/dL (ref 0.2–1.2)
Total Protein: 7.4 g/dL (ref 6.0–8.3)

## 2018-08-28 LAB — HEMOGLOBIN A1C: Hgb A1c MFr Bld: 6.7 % — ABNORMAL HIGH (ref 4.6–6.5)

## 2018-08-28 LAB — T4, FREE: Free T4: 0.69 ng/dL (ref 0.60–1.60)

## 2018-08-28 LAB — TSH: TSH: 15.36 u[IU]/mL — ABNORMAL HIGH (ref 0.35–4.50)

## 2018-08-31 DIAGNOSIS — E113312 Type 2 diabetes mellitus with moderate nonproliferative diabetic retinopathy with macular edema, left eye: Secondary | ICD-10-CM | POA: Diagnosis not present

## 2018-09-02 ENCOUNTER — Encounter: Payer: Self-pay | Admitting: Endocrinology

## 2018-09-02 ENCOUNTER — Ambulatory Visit (INDEPENDENT_AMBULATORY_CARE_PROVIDER_SITE_OTHER): Payer: Medicare Other | Admitting: Endocrinology

## 2018-09-02 VITALS — BP 128/74 | HR 95 | Ht 66.0 in | Wt 288.6 lb

## 2018-09-02 DIAGNOSIS — E1165 Type 2 diabetes mellitus with hyperglycemia: Secondary | ICD-10-CM

## 2018-09-02 DIAGNOSIS — E890 Postprocedural hypothyroidism: Secondary | ICD-10-CM

## 2018-09-02 DIAGNOSIS — I10 Essential (primary) hypertension: Secondary | ICD-10-CM | POA: Diagnosis not present

## 2018-09-02 DIAGNOSIS — Z794 Long term (current) use of insulin: Secondary | ICD-10-CM | POA: Diagnosis not present

## 2018-09-02 MED ORDER — LEVOTHYROXINE SODIUM 125 MCG PO TABS: ORAL_TABLET | ORAL | 3 refills | Status: DC

## 2018-09-02 MED ORDER — INSULIN ASPART 100 UNIT/ML FLEXPEN: PEN_INJECTOR | SUBCUTANEOUS | 1 refills | Status: DC

## 2018-09-02 NOTE — Progress Notes (Signed)
[name] is a 67 y.o. female.    Reason for Appointment: Diabetes follow-up   History of Present Illness   Diagnosis: Type 2 DIABETES MELITUS, date of diagnosis: 2000  Previous history: She had initially been treated with metformin and Amaryl and subsequently given Victoza which helped overall control and weight loss. In 9/13 because of significant hyperglycemia she was started on basal bolus insulin also. Had required relatively small doses of insulin. Her blood sugar control has been excellent with A1c between 5.3-6.0 although this may be relatively higher than expected. Tends to have relatively higher readings after supper In 6/15 she was told to resume her Victoza and stop her Lantus since she had been gaining weight with Lantus and NovoLog regimen.   Recent history:     INSULIN regimen: Novolog 0 units before meals;     Non-insulin hypoglycemic drugs: Soliqua 26 units daily   Her A1c is 6.7, previously 5.7   Current blood sugar patterns and management:  Her blood sugars are under fair control  On her last visit she was complaining of persistent nausea when she was told to temporarily switch to Lantus instead of Soliqua  However she did not request a prescription for Lantus but appears to be continuing on Soliqua  Her nausea is not present now and she is only taking 26 units compared to 34 units previously  Although she is using the freestyle LIBRE sensor she is checking her blood sugars with this only once a day and difficult to get a blood sugar pattern  She also thinks that her Accu-Chek and freestyle libre are fairly consistent and accuracy  HIGHEST blood sugars are after her evening meal and with the limited data available appear to be averaging nearly 200 and as high as about 240  However FASTING blood sugars although variable are recently excellent without hypoglycemia  She is not doing any formal exercise but she thinks she is a little more active  with less musculoskeletal pain issues  Still unable to lose weight  Despite high readings after supper she does not take NovoLog which she has previously taken     Side effects from medications:  none  Monitors blood glucose: Once a day or less on average .    Glucometer:  Freestyle Libre/Accu-Chek .          Blood Glucose readings as follows   CONTINUOUS GLUCOSE MONITORING RECORD INTERPRETATION    Dates of Recording: 11/14 through 11/27  Sensor description: [employer]  Results statistics:   CGM use % of time  36  Average and SD  132+/-46  Time in range     83   %  % Time Above 180  15  % Time above 250 1  % Time Below target 2      PRE-MEAL Fasting  4-6 PM Dinner Bedtime Overall  Glucose range:       Mean/median:  99  151  159  186  132   POST-MEAL PC Breakfast PC Lunch PC Dinner  Glucose range:     Mean/median:    176     Meals: 1-2 meals per day. Lunch Supper at 7 PM pm           Physical activity: exercise: Limited, has had fatigue, back and leg pain.            Dietician visit: Most recent: 02/3613            Complications: are:  None  Wt Readings from Last 3 Encounters:  09/02/18 288 lb 9.6 oz (130.9 kg)  08/06/18 284 lb 2 oz (128.9 kg)  07/28/18 286 lb 9.6 oz (130 kg)    LABS:  Lab Results  Component Value Date   HGBA1C 6.7 (H) 08/28/2018   HGBA1C 5.7 (A) 07/22/2018   HGBA1C 0 07/22/2018   HGBA1C 0 (A) 07/22/2018   HGBA1C 0.0 07/22/2018   Lab Results  Component Value Date   MICROALBUR 2.0 (H) 12/31/2017   LDLCALC 85 06/15/2018   CREATININE 1.45 (H) 08/28/2018    Other active problems: See review of systems   Appointment on 08/28/2018  Component Date Value Ref Range Status  . Free T4 08/28/2018 0.69  0.60 - 1.60 ng/dL Final   Comment: Specimens from patients who are undergoing biotin therapy and /or ingesting biotin supplements may contain high levels of biotin.  The higher biotin concentration in these specimens interferes with  this Free T4 assay.  Specimens that contain high levels  of biotin may cause false high results for this Free T4 assay.  Please interpret results in light of the total clinical presentation of the patient.    Marland Kitchen TSH 08/28/2018 15.36* 0.35 - 4.50 uIU/mL Final  . Sodium 08/28/2018 140  135 - 145 mEq/L Final  . Potassium 08/28/2018 4.5  3.5 - 5.1 mEq/L Final  . Chloride 08/28/2018 106  96 - 112 mEq/L Final  . CO2 08/28/2018 27  19 - 32 mEq/L Final  . Glucose, Bld 08/28/2018 146* 70 - 99 mg/dL Final  . BUN 08/28/2018 22  6 - 23 mg/dL Final  . Creatinine, Ser 08/28/2018 1.45* 0.40 - 1.20 mg/dL Final  . Total Bilirubin 08/28/2018 0.8  0.2 - 1.2 mg/dL Final  . Alkaline Phosphatase 08/28/2018 90  39 - 117 U/L Final  . AST 08/28/2018 10  0 - 37 U/L Final  . ALT 08/28/2018 9  0 - 35 U/L Final  . Total Protein 08/28/2018 7.4  6.0 - 8.3 g/dL Final  . Albumin 08/28/2018 3.9  3.5 - 5.2 g/dL Final  . Calcium 08/28/2018 9.6  8.4 - 10.5 mg/dL Final  . GFR 08/28/2018 46.11* >60.00 mL/min Final  . Hgb A1c MFr Bld 08/28/2018 6.7* 4.6 - 6.5 % Final   Glycemic Control Guidelines for People with Diabetes:Non Diabetic:  <6%Goal of Therapy: <7%Additional Action Suggested:  >8%     Allergies as of 09/02/2018      Reactions   Ciprofloxacin Shortness Of Breath, Other (See Comments)   Dizziness   Hydrocodone Shortness Of Breath, Other (See Comments)   Tachycardia   Penicillins Hives   Pt just remembers hives Has patient had a PCN reaction causing immediate rash, facial/tongue/throat swelling, SOB or lightheadedness with hypotension: No Has patient had a PCN reaction causing severe rash involving mucus membranes or skin necrosis: No Has patient had a PCN reaction that required hospitalization No Has patient had a PCN reaction occurring within the last 10 years: No If all of the above answers are "NO", then may proceed with Cephalosporin use.      Medication List        Accurate as of 09/02/18  3:31 PM.  Always use your most recent med list.          acetaminophen-codeine 300-30 MG tablet Commonly known as:  TYLENOL #3 Take 1-2 tablets by mouth every 6 (six) hours as needed for moderate pain.   aspirin 81 MG EC tablet Take 81 mg by  mouth every evening.   atorvastatin 40 MG tablet Commonly known as:  LIPITOR Take 1 tablet (40 mg total) by mouth daily.   buPROPion 150 MG 24 hr tablet Commonly known as:  WELLBUTRIN XL Take 1 tablet (150 mg total) by mouth daily.   cyclobenzaprine 10 MG tablet Commonly known as:  FLEXERIL Take 10 mg by mouth 2 (two) times daily.   ELMIRON 100 MG capsule Generic drug:  pentosan polysulfate Take 200 mg by mouth 2 (two) times daily.   estradiol 0.1 mg/24hr patch Commonly known as:  CLIMARA - Dosed in mg/24 hr Place 1 patch (0.1 mg total) onto the skin once a week.   FLUoxetine 20 MG capsule Commonly known as:  PROZAC TAKE 20mg  BY MOUTH DAILY   FREESTYLE LIBRE 14 DAY READER Devi by Does not apply route.   gabapentin 100 MG capsule Commonly known as:  NEURONTIN Take 1 capsule (100 mg total) by mouth 3 (three) times daily.   glucose blood test strip Use as instructed to check blood sugar 4 times daily.   Insulin Glargine-Lixisenatide 100-33 UNT-MCG/ML Sopn Inject 26 Units into the skin daily with breakfast.   LANTUS 100 UNIT/ML injection Generic drug:  insulin glargine Inject into the skin daily. Inject 10 units into the skin 3 (three) times daily with meals.   levETIRAcetam 250 MG tablet Commonly known as:  KEPPRA Take 500 mg by mouth 2 (two) times daily.   levothyroxine 125 MCG tablet Commonly known as:  SYNTHROID, LEVOTHROID Take 1 tablet (125 mcg total) by mouth daily.   lidocaine 5 % Commonly known as:  LIDODERM Place 1 patch onto the skin daily. Remove & Discard patch within 12 hours or as directed by MD   linaclotide 72 MCG capsule Commonly known as:  LINZESS Take 1 capsule (72 mcg total) by mouth daily before  breakfast.   losartan 100 MG tablet Commonly known as:  COZAAR Take 1 tablet (100 mg total) by mouth daily.   metoCLOPramide 5 MG tablet Commonly known as:  REGLAN Take 1 tablet (5 mg total) by mouth 2 (two) times daily. Take 30  Minutes before breakfast and dinner.   naltrexone 50 MG tablet Commonly known as:  DEPADE Take 1 tablet (50 mg total) by mouth daily.   ondansetron 4 MG disintegrating tablet Commonly known as:  ZOFRAN-ODT Take 1 tablet (4 mg total) by mouth every 8 (eight) hours as needed.   progesterone 100 MG capsule Commonly known as:  PROMETRIUM Take 1 capsule (100 mg total) by mouth at bedtime.   TOBRAMYCIN-DEXAMETHASONE OP Place 1 drop into both eyes 2 (two) times daily.   torsemide 20 MG tablet Commonly known as:  DEMADEX Take 20 mg by mouth daily.   traMADol 50 MG tablet Commonly known as:  ULTRAM Take 50 mg by mouth every 6 (six) hours as needed for moderate pain.   trimethoprim 100 MG tablet Commonly known as:  TRIMPEX Take 100 mg by mouth at bedtime.       Allergies:  Allergies  Allergen Reactions  . Ciprofloxacin Shortness Of Breath and Other (See Comments)    Dizziness   . Hydrocodone Shortness Of Breath and Other (See Comments)    Tachycardia  . Penicillins Hives    Pt just remembers hives Has patient had a PCN reaction causing immediate rash, facial/tongue/throat swelling, SOB or lightheadedness with hypotension: No Has patient had a PCN reaction causing severe rash involving mucus membranes or skin necrosis: No Has patient had a  PCN reaction that required hospitalization No Has patient had a PCN reaction occurring within the last 10 years: No If all of the above answers are "NO", then may proceed with Cephalosporin use.     Past Medical History:  Diagnosis Date  . Anxiety   . Arthritis   . Blood dyscrasia    "FREE BLEEDER"  . CERVICAL RADICULOPATHY, LEFT   . Chronic back pain   . CKD (chronic kidney disease)    DR. SANFORD   [facility]  . COMMON MIGRAINE   . CORONARY ARTERY DISEASE   . Cough    CURRENT COLD  . Cystitis   . DEPRESSION   . Diabetes mellitus, type II ([place])   . DIVERTICULOSIS-COLON   . Gastric ulcer 04/2008  . Gastroparesis   . GERD (gastroesophageal reflux disease)   . Hiatal hernia   . Hyperlipidemia   . Hypertension   . Hypothyroidism   . INSOMNIA-SLEEP DISORDER-UNSPEC   . Iron deficiency anemia   . Wears glasses     Past Surgical History:  Procedure Laterality Date  . ABDOMINAL HYSTERECTOMY    . ANTERIOR LAT LUMBAR FUSION Left 08/13/2017   Procedure: LEFT SIDED LUMBAR 3-4 LATERAL INTERBODY FUSION WITH INSTRUMENTATION AND ALLOGRAFT;  Surgeon: Phylliss Bob, MD;  Location: [facility];  Service: Orthopedics;  Laterality: Left;  LEFT SIDED LUMBAR 3-4 LATERAL INTERBODY FUSION WITH INSTRUMENTATION AND ALLOGRAFT; REQUEST 3 HOURS  . Back Stimulator  07/2018  . BACK SURGERY  03/2016  . BREAST EXCISIONAL BIOPSY Right    40 years ago  . CHOLECYSTECTOMY  06/2009  . COLONOSCOPY    . ESOPHAGOGASTRODUODENOSCOPY    . EYE SURGERY Bilateral    lasik  . Gastric Wedge resection lipoma  11/2007   x2 with laparotomy and gastrostomy  . Left knee replacement    . Rigth knee replacement with revision  04/2008   Dr. Berenice Primas  . ROTATOR CUFF REPAIR Left 01/2009  . s/p bladder surgury  09/2009   Dr. Terance Hart    Family History  Problem Relation Age of Onset  . Diabetes Sister        x 3  . Heart disease Sister        x2  . Breast cancer Sister 30  . Diabetes Brother        x3  . Heart disease Brother        x2  . Coronary artery disease Other        female 1st degree  . Hypertension Other   . Breast cancer Sister 64  . Breast cancer Other 25  . Diabetes Brother   . Colon cancer Neg Hx     Social History:  reports that she has quit smoking. She has never used smokeless tobacco. She reports that she does not drink alcohol or use drugs.  Review of Systems:  Hypertension: Her blood pressure is  treated with only losartan 100 mg only and followed by nephrologist  NAUSEA and decreased appetite More recently symptoms are better, possibly from her taking her Reglan more consistently before meals   CKD: Her creatinine is variable, followed by nephrologist and recently better No hyperkalemia Lab Results  Component Value Date   CREATININE 1.45 (H) 08/28/2018   CREATININE 1.49 (H) 06/15/2018   CREATININE 1.53 (H) 04/05/2018   CREATININE 1.43 (H) 02/02/2018   Lab Results  Component Value Date   K 4.5 08/28/2018     Lipids: Followed by PCP, LDL has been recently controlled  Taking Lipitor 40 mg with results as follows  Lab Results  Component Value Date   CHOL 150 06/15/2018   HDL 50.00 06/15/2018   [place] 85 06/15/2018   LDLDIRECT 156.6 01/07/2011   TRIG 77.0 06/15/2018   CHOLHDL 3 06/15/2018     Post ablative hypothyroidism:  Previously she had I-131 treatment on 08/20/11 for Graves' disease  Her dose has been adjusted periodically Since her TSH was markedly increased in 3/19 her dose was increased up to 125 mcg  However subsequently her TSH has been slightly low or low On her last visit she was supposed to change to 6-1/2 tablets a week but she is only taking 6 tablets a week  Not clear why her TSH is much higher than before She is thinks she is taking her levothyroxine consistently every day and no other supplements or vitamins at the same time    Lab Results  Component Value Date   TSH 15.36 (H) 08/28/2018   TSH 0.41 06/15/2018   TSH 0.26 (L) 02/02/2018   FREET4 0.69 08/28/2018   FREET4 0.90 06/15/2018   FREET4 0.53 (L) 12/31/2017    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     ASSESSMENT/ PLAN:   Diabetes  type 2, uncontrolled with obesity:   See history of present illness for detailed discussion of current management, blood sugar patterns and problems identified  A1C is still reasonably controlled although higher at 6.7  Her blood sugar patterns are only evaluated tolerated extent because of lack of adequate glucose monitoring This is despite her having the freestyle libre system Only checking blood sugars in the mornings and rarely any other time Discussed that despite using Soliqua her postprandial readings are high after her main meal in the evening Discussed need for mealtime coverage with short-acting insulin to be taken before eating based on what she is planning to eat Current dose of [name] is limited by the fact that her fasting readings are sometimes as low as 86 and she may have nausea with higher doses Also apparently is able to continue [place] without financial difficulties Emphasized the need to check blood sugars 3-4 times a day and consistently after supper and some during the day for better interpretation of her freestyle libre data   Hypothyroidism: Her TSH is much higher and this is unexpected Her dose was only reduced slightly on the last visit when TSH was persistently low normal Not clear if she is symptomatic Exam appears euthyroid  She will continue her Synthroid but take 7-1/2 tablets a week which is the equivalent of 133 mcg average daily dose until the next visit  Hypertension: Well controlled  Counseling time on subjects discussed in assessment and plan sections is over 50% of today's 25 minute visit      Patient Instructions  7 1/2 pills per week         Elayne Snare 09/02/2018, 3:31 PM   Note: This office note was prepared with Dragon voice recognition system technology. Any transcriptional errors that result from this process are unintentional.

## 2018-09-02 NOTE — Patient Instructions (Signed)
7 1/2 pills per week

## 2018-09-04 NOTE — Pre-Procedure Instructions (Signed)
OMAR ORREGO  09/04/2018      [id number] [address], [place] [address] Phone: [phone] Fax: [phone]    Your procedure is scheduled on Friday December 6th.  Report to [facility] Admitting at 10:00 A.M.  Call this number if you have problems the morning of surgery:  [phone]   Remember:  Do not eat or drink after midnight.     Take these medicines the morning of surgery with A SIP OF WATER  buPROPion (WELLBUTRIN XL)  FLUoxetine (PROZAC)  gabapentin (NEURONTIN) levETIRAcetam (KEPPRA)  levothyroxine (SYNTHROID, LEVOTHROID)  metoCLOPramide (REGLAN) TOBRAMYCIN-DEXAMETHASONE OP traMADol (ULTRAM) -if needed ondansetron (ZOFRAN ODT)-if needed  cyclobenzaprine (FLEXERIL)-if needed acetaminophen-codeine (TYLENOL #3-if needed  Follow your surgeon's instructions on when to stop Asprin.  If no instructions were given by your surgeon then you will need to call the office to get those instructions.    7 days prior to surgery STOP taking any Aspirin(unless otherwise instructed by your surgeon), Aleve, Naproxen, Ibuprofen, Motrin, Advil, Goody's, BC's, all herbal medications, fish oil, and all vitamins,   HOW TO MANAGE YOUR DIABETES BEFORE AND AFTER SURGERY  Why is it important to control my blood sugar before and after surgery? . Improving blood sugar levels before and after surgery helps healing and can limit problems. . A way of improving blood sugar control is eating a healthy diet by: o  Eating less sugar and carbohydrates o  Increasing activity/exercise o  Talking with your doctor about reaching your blood sugar goals . High blood sugars (greater than 180 mg/dL) can raise your risk of infections and slow your recovery, so you will need to focus on controlling your diabetes during the weeks before surgery. . Make sure that the doctor who takes  care of your diabetes knows about your planned surgery including the date and location.  How do I manage my blood sugar before surgery? . Check your blood sugar at least 4 times a day, starting 2 days before surgery, to make sure that the level is not too high or low. o Check your blood sugar the morning of your surgery when you wake up and every 2 hours until you get to the Short Stay unit. . If your blood sugar is less than 70 mg/dL, you will need to treat for low blood sugar: o Do not take insulin. o Treat a low blood sugar (less than 70 mg/dL) with  cup of clear juice (cranberry or apple), 4 glucose tablets, OR glucose gel. Recheck blood sugar in 15 minutes after treatment (to make sure it is greater than 70 mg/dL). If your blood sugar is not greater than 70 mg/dL on recheck, call [phone] o  for further instructions. . Report your blood sugar to the short stay nurse when you get to Short Stay.  . If you are admitted to the hospital after surgery: o Your blood sugar will be checked by the staff and you will probably be given insulin after surgery (instead of oral diabetes medicines) to make sure you have good blood sugar levels. o The goal for blood sugar control after surgery is 80-180 mg/dL.     WHAT DO I DO ABOUT MY DIABETES MEDICATION?   Marland Kitchen Do not take oral diabetes medicines (pills):  the morning of surgery.  . THE NIGHT BEFORE SURGERY, do not take bedtime dose of Novolog      .  THE MORNING OF SURGERY, take 13 units of Insulin Glargine-Lixisenatide (SOLIQUA)  . The day of surgery, do not take other diabetes injectables, including Byetta (exenatide), Bydureon (exenatide ER), Victoza (liraglutide), or Trulicity (dulaglutide).    Do not wear jewelry, make-up or nail polish.  Do not wear lotions, powders, or perfumes, or deodorant.  Do not shave 48 hours prior to surgery.  Men may shave face and neck.  Do not bring valuables to the hospital.  [facility] is not responsible  for any belongings or valuables.  Contacts, dentures or bridgework may not be worn into surgery.  Leave your suitcase in the car.  After surgery it may be brought to your room.  For patients admitted to the hospital, discharge time will be determined by your treatment team.  Patients discharged the day of surgery will not be allowed to drive home.   [place]- Preparing For Surgery  Before surgery, you can play an important role. Because skin is not sterile, your skin needs to be as free of germs as possible. You can reduce the number of germs on your skin by washing with CHG (chlorahexidine gluconate) Soap before surgery.  CHG is an antiseptic cleaner which kills germs and bonds with the skin to continue killing germs even after washing.    Oral Hygiene is also important to reduce your risk of infection.  Remember - BRUSH YOUR TEETH THE MORNING OF SURGERY WITH YOUR REGULAR TOOTHPASTE  Please do not use if you have an allergy to CHG or antibacterial soaps. If your skin becomes reddened/irritated stop using the CHG.  Do not shave (including legs and underarms) for at least 48 hours prior to first CHG shower. It is OK to shave your face.  Please follow these instructions carefully.   1. Shower the NIGHT BEFORE SURGERY and the MORNING OF SURGERY with CHG.   2. If you chose to wash your hair, wash your hair first as usual with your normal shampoo.  3. After you shampoo, rinse your hair and body thoroughly to remove the shampoo.  4. Use CHG as you would any other liquid soap. You can apply CHG directly to the skin and wash gently with a scrungie or a clean washcloth.   5. Apply the CHG Soap to your body ONLY FROM THE NECK DOWN.  Do not use on open wounds or open sores. Avoid contact with your eyes, ears, mouth and genitals (private parts). Wash Face and genitals (private parts)  with your normal soap.  6. Wash thoroughly, paying special attention to the area where your surgery will be  performed.  7. Thoroughly rinse your body with warm water from the neck down.  8. DO NOT shower/wash with your normal soap after using and rinsing off the CHG Soap.  9. Pat yourself dry with a CLEAN TOWEL.  10. Wear CLEAN PAJAMAS to bed the night before surgery, wear comfortable clothes the morning of surgery  11. Place CLEAN SHEETS on your bed the night of your first shower and DO NOT SLEEP WITH PETS.    Day of Surgery: Shower as stated above. Do not apply any deodorants/lotions.  Please wear clean clothes to the hospital/surgery center.   Remember to brush your teeth WITH YOUR REGULAR TOOTHPASTE.

## 2018-09-07 ENCOUNTER — Other Ambulatory Visit: Payer: Self-pay

## 2018-09-07 ENCOUNTER — Encounter (HOSPITAL_COMMUNITY): Payer: Self-pay

## 2018-09-07 ENCOUNTER — Encounter (HOSPITAL_COMMUNITY)
Admission: RE | Admit: 2018-09-07 | Discharge: 2018-09-07 | Disposition: A | Discharge status: Home / Self Care | Payer: Medicare Other | Source: Ambulatory Visit | Attending: Anesthesiology | Admitting: Anesthesiology

## 2018-09-07 DIAGNOSIS — Z01818 Encounter for other preprocedural examination: Secondary | ICD-10-CM | POA: Diagnosis not present

## 2018-09-07 DIAGNOSIS — N183 Chronic kidney disease, stage 3 (moderate): Secondary | ICD-10-CM | POA: Diagnosis not present

## 2018-09-07 DIAGNOSIS — I129 Hypertensive chronic kidney disease with stage 1 through stage 4 chronic kidney disease, or unspecified chronic kidney disease: Secondary | ICD-10-CM | POA: Diagnosis not present

## 2018-09-07 HISTORY — DX: Cardiac arrhythmia, unspecified: I49.9

## 2018-09-07 LAB — COMPREHENSIVE METABOLIC PANEL
ALT: 14 U/L (ref 0–44)
AST: 20 U/L (ref 15–41)
Albumin: 3.7 g/dL (ref 3.5–5.0)
Alkaline Phosphatase: 90 U/L (ref 38–126)
Anion gap: 10 (ref 5–15)
BUN: 16 mg/dL (ref 8–23)
CO2: 24 mmol/L (ref 22–32)
Calcium: 9.5 mg/dL (ref 8.9–10.3)
Chloride: 105 mmol/L (ref 98–111)
Creatinine, Ser: 1.39 mg/dL — ABNORMAL HIGH (ref 0.44–1.00)
GFR calc Af Amer: 45 mL/min — ABNORMAL LOW (ref >60)
GFR calc non Af Amer: 39 mL/min — ABNORMAL LOW (ref >60)
Glucose, Bld: 209 mg/dL — ABNORMAL HIGH (ref 70–99)
Potassium: 4.1 mmol/L (ref 3.5–5.1)
Sodium: 139 mmol/L (ref 135–145)
Total Bilirubin: 0.7 mg/dL (ref 0.3–1.2)
Total Protein: 7.6 g/dL (ref 6.5–8.1)

## 2018-09-07 LAB — CBC
HCT: 36.5 % (ref 36.0–46.0)
Hemoglobin: 11 g/dL — ABNORMAL LOW (ref 12.0–15.0)
MCH: 30.9 pg (ref 26.0–34.0)
MCHC: 30.1 g/dL (ref 30.0–36.0)
MCV: 102.5 fL — ABNORMAL HIGH (ref 80.0–100.0)
Platelets: 222 10*3/uL (ref 150–400)
RBC: 3.56 MIL/uL — ABNORMAL LOW (ref 3.87–5.11)
RDW: 12.4 % (ref 11.5–15.5)
WBC: 7.4 10*3/uL (ref 4.0–10.5)
nRBC: 0 % (ref 0.0–0.2)

## 2018-09-07 LAB — GLUCOSE, CAPILLARY: Glucose-Capillary: 143 mg/dL — ABNORMAL HIGH (ref 70–99)

## 2018-09-07 LAB — SURGICAL PCR SCREEN
MRSA, PCR: NEGATIVE
Staphylococcus aureus: NEGATIVE

## 2018-09-07 NOTE — Pre-Procedure Instructions (Signed)
[name]  09/07/2018      [id number], [place] [facility] [address] Phone: [phone] Fax: [phone]    Your procedure is scheduled on Friday December 6th.   Report to [facility] Admitting at 10:00 A.M.              (posted surgery time 12n - 1:50p)   Call this number if you have problems the morning of surgery:  [phone]   Remember:   Do not eat any foods or drink any liquids after midnight, Thursday.     Take these medicines the morning of surgery with A SIP OF WATER  buPROPion (WELLBUTRIN XL)  FLUoxetine (PROZAC)  gabapentin (NEURONTIN) levETIRAcetam (KEPPRA)  levothyroxine (SYNTHROID, LEVOTHROID)  metoCLOPramide (REGLAN) TOBRAMYCIN-DEXAMETHASONE OP traMADol (ULTRAM) -if needed ondansetron (ZOFRAN ODT)-if needed  cyclobenzaprine (FLEXERIL)-if needed acetaminophen-codeine (TYLENOL #3-if needed  Follow your surgeon's instructions on when to stop Asprin.  If no instructions were given by your surgeon then you will need to call the office to get those instructions.    7 days prior to surgery STOP taking any Aspirin(unless otherwise instructed by your surgeon), Aleve, Naproxen, Ibuprofen, Motrin, Advil, Goody's, BC's, all herbal medications, fish oil, and all vitamins,  Do not wear jewelry, make-up or nail polish.  Do not wear lotions, powders, or perfumes, or deodorant.  Do not shave 48 hours prior to surgery.  Men may shave face and neck.  Do not bring valuables to the hospital.  [facility] is not responsible for any belongings or valuables.  Contacts, dentures or bridgework may not be worn into surgery.  Leave your suitcase in the car.  After surgery it may be brought to your room.  For patients admitted to the hospital, discharge time will be determined by your treatment team.   Patients discharged the day of surgery will not be  allowed to drive home.      [place]- Preparing For Surgery  Before surgery, you can play an important role. Because skin is not sterile, your skin needs to be as free of germs as possible. You can reduce the number of germs on your skin by washing with CHG (chlorahexidine gluconate) Soap before surgery.  CHG is an antiseptic cleaner which kills germs and bonds with the skin to continue killing germs even after washing.    Oral Hygiene is also important to reduce your risk of infection.  Remember - BRUSH YOUR TEETH THE MORNING OF SURGERY WITH YOUR REGULAR TOOTHPASTE  Please do not use if you have an allergy to CHG or antibacterial soaps. If your skin becomes reddened/irritated stop using the CHG.  Do not shave (including legs and underarms) for at least 48 hours prior to first CHG shower. It is OK to shave your face.  Please follow these instructions carefully.   1. Shower the NIGHT BEFORE SURGERY and the MORNING OF SURGERY with CHG.   2. If you chose to wash your hair, wash your hair first as usual with your normal shampoo.  3. After you shampoo, rinse your hair and body thoroughly to remove the shampoo.  4. Use CHG as you would any other liquid soap. You can apply CHG directly to the skin and wash gently with a scrungie or a clean washcloth.   5. Apply the CHG Soap to your body ONLY FROM THE NECK DOWN.  Do  not use on open wounds or open sores. Avoid contact with your eyes, ears, mouth and genitals (private parts). Wash Face and genitals (private parts)  with your normal soap.  6. Wash thoroughly, paying special attention to the area where your surgery will be performed.  7. Thoroughly rinse your body with warm water from the neck down.  8. DO NOT shower/wash with your normal soap after using and rinsing off the CHG Soap.  9. Pat yourself dry with a CLEAN TOWEL.  10. Wear CLEAN PAJAMAS to bed the night before surgery, wear comfortable clothes the morning of surgery  11. Place  CLEAN SHEETS on your bed the night of your first shower and DO NOT SLEEP WITH PETS.    Day of Surgery: Shower as stated above. Do not apply any deodorants/lotions.  Please wear clean clothes to the hospital/surgery center.   Remember to brush your teeth WITH YOUR REGULAR TOOTHPASTE.       HOW TO MANAGE YOUR DIABETES BEFORE AND AFTER SURGERY  Why is it important to control my blood sugar before and after surgery? . Improving blood sugar levels before and after surgery helps healing and can limit problems. . A way of improving blood sugar control is eating a healthy diet by: o  Eating less sugar and carbohydrates o  Increasing activity/exercise o  Talking with your doctor about reaching your blood sugar goals . High blood sugars (greater than 180 mg/dL) can raise your risk of infections and slow your recovery, so you will need to focus on controlling your diabetes during the weeks before surgery. . Make sure that the doctor who takes care of your diabetes knows about your planned surgery including the date and location.  How do I manage my blood sugar before surgery? . Check your blood sugar at least 4 times a day, starting 2 days before surgery, to make sure that the level is not too high or low. o Check your blood sugar the morning of your surgery when you wake up and every 2 hours until you get to the Short Stay unit. . If your blood sugar is less than 70 mg/dL, you will need to treat for low blood sugar: o Do not take insulin. o Treat a low blood sugar (less than 70 mg/dL) with  cup of clear juice (cranberry or apple), 4 glucose tablets, OR glucose gel. Recheck blood sugar in 15 minutes after treatment (to make sure it is greater than 70 mg/dL). If your blood sugar is not greater than 70 mg/dL on recheck, call [phone] o  for further instructions. . Report your blood sugar to the short stay nurse when you get to Short Stay.  . If you are admitted to the hospital after  surgery: o Your blood sugar will be checked by the staff and you will probably be given insulin after surgery (instead of oral diabetes medicines) to make sure you have good blood sugar levels. o The goal for blood sugar control after surgery is 80-180 mg/dL.     WHAT DO I DO ABOUT MY DIABETES MEDICATION?   Marland Kitchen Do not take oral diabetes medicines (pills):  the morning of surgery.  . THE NIGHT BEFORE SURGERY, do not take bedtime dose of Novolog    . THE MORNING OF SURGERY, take 13 units of Insulin Glargine-Lixisenatide (SOLIQUA)  . The day of surgery, do not take other diabetes injectables, including Byetta (exenatide), Bydureon (exenatide ER), Victoza (liraglutide), or Trulicity (dulaglutide).

## 2018-09-07 NOTE — Progress Notes (Signed)
PCP Dr. Doyle Askew  LOV 06/2018 Nephrology is Dr. Joelyn Oms from [facility].  Am attempting to get labs he requested via prescription form.  LOV 09/07/2018 Endo is Dr. Dwyane Dee  LOV 09/02/2018 She does have Libra meter on her left shoulder.  Blood sugars range from 46 - 277.   Cardio is Dr. Lurline Del.  LOV 02/2017  I did call the office and request LOV note & and cardiac testing done within last couple of yrs. Patient denies cp, sob, murmur. Ms Gorelick states that Dr. Maryjean Ka nurse instructed her to stop her aspirin 2 weeks in advance.

## 2018-09-07 NOTE — Progress Notes (Signed)
   09/07/18 1418  OBSTRUCTIVE SLEEP APNEA  Have you ever been diagnosed with sleep apnea through a sleep study? No  Do you snore loudly (loud enough to be heard through closed doors)?  1  Do you often feel tired, fatigued, or sleepy during the daytime (such as falling asleep during driving or talking to someone)? 1  Has anyone observed you stop breathing during your sleep? 0  Do you have, or are you being treated for high blood pressure? 1  BMI more than 35 kg/m2? 1  Age > 50 (1-yes) 1  Neck circumference greater than:Female 16 inches or larger, Female 17inches or larger? 0  Female Gender (Yes=1) 0  Obstructive Sleep Apnea Score 5  Score 5 or greater  Results sent to PCP

## 2018-09-08 NOTE — Anesthesia Preprocedure Evaluation (Addendum)
Anesthesia Evaluation  Patient identified by MRN, date of birth, ID band Patient awake    Reviewed: Allergy & Precautions, H&P , NPO status , Patient's Chart, lab work & pertinent test results  Airway Mallampati: II  TM Distance: >3 FB Neck ROM: full    Dental  (+) Partial Lower, Partial Upper, Dental Advisory Given   Pulmonary former smoker,    breath sounds clear to auscultation       Cardiovascular hypertension, Pt. on medications + CAD   Rhythm:regular Rate:Normal     Neuro/Psych  Headaches, PSYCHIATRIC DISORDERS Anxiety Depression  Neuromuscular disease    GI/Hepatic hiatal hernia, PUD, GERD  Medicated and Controlled,  Endo/Other  diabetes, Well Controlled, Type 2, Insulin DependentHypothyroidism Morbid obesity  Renal/GU Renal InsufficiencyRenal disease     Musculoskeletal  (+) Arthritis , Osteoarthritis,    Abdominal   Peds  Hematology  (+) Blood dyscrasia, anemia ,   Anesthesia Other Findings   Reproductive/Obstetrics                           Anesthesia Physical Anesthesia Plan  ASA: III  Anesthesia Plan: General   Post-op Pain Management:    Induction: Intravenous  PONV Risk Score and Plan: 3 and Ondansetron, Dexamethasone, Midazolam and Treatment may vary due to age or medical condition  Airway Management Planned: Oral ETT  Additional Equipment:   Intra-op Plan:   Post-operative Plan: Extubation in OR  Informed Consent: I have reviewed the patients History and Physical, chart, labs and discussed the procedure including the risks, benefits and alternatives for the proposed anesthesia with the patient or authorized representative who has indicated his/her understanding and acceptance.   Dental advisory given  Plan Discussed with: CRNA, Anesthesiologist and Surgeon  Anesthesia Plan Comments: (PAT note written 09/08/2018 by Myra Gianotti, PA-C. )      Anesthesia  Quick Evaluation

## 2018-09-08 NOTE — Progress Notes (Signed)
Anesthesia Chart Review:  Case:  [id number] Date/Time:  09/11/18 1145   Procedure:  LUMBAR SPINAL CORD STIMULATOR INSERTION (N/A ) - LUMBAR SPINAL CORD STIMULATOR INSERTION   Anesthesia type:  Monitor Anesthesia Care   Pre-op diagnosis:  Lumbar post laminectomy syndrome   Location:  [facility] OR ROOM 18 / [place] OR   Surgeon:  Clydell Hakim, MD      DISCUSSION: Patient is a 67 year old female scheduled for the above procedure.   History includes former smoker, migraines, diverticulosis, CAD (10-15% mLAD, patent RCA, 1.5 mm D1 60-70%, small ramus branch 70-80%, optimize medical tx recommended 02/24/07; non-ischemic stress test 07/2017), HTN, HLD, DM2, gastroparesis, CKD stage III, hiatal hernia, hypothyroidism, iron deficiency anemia, back surgery (L4-5 transforaminal lumbar fusion 04/03/16; left L3-4 lateral interbody fusion 08/13/17, L3-4, L4-5 posterior fusion 08/14/17). BMI is consistent with morbid obesity. OSA screening score of 5.   She had a stress test just over a year ago (ordered by Dr. Terrence Dupont prior to patient undergoing back-to-back lumbar surgeries. Patient denied chest pain and SOB. Per patient, she was instructed to hold ASA 2 weeks prior to surgery. Last A1c 6.7%.  Based on currently available information, I would anticipate that she can proceed as planned if no acute changes.    VS: BP 138/78   Pulse 86   Temp 36.8 C   Resp 18   Ht 5\' 6"  (1.676 m)   Wt 125.6 kg   SpO2 100%   BMI 44.71 kg/m   PROVIDERS: Biagio Borg, MD is PCP. Last visit 07/22/18. Pearson Grippe, MD is nephrologist ([place]). 02/04/18 office visit is scanned under Media tab. Elayne Snare, MD is endocrinologist. Last visit 09/02/18. He increased her Synthroid dose due to elevated TSH. A1c stable at 6.7. She has a Lobbyist. Reports home CBGs ~ 44-277. Andrey Spearman, MD is neurologist. Last visit 07/28/18 for cervical DDD. Lucio Edward, MD is GI. Kirstie Peri, MD is GU. Charolette Forward, MD is cardiologist. Currently last visit seen was on 07/08/17 with non-ischemic stress test later that month.   LABS: Labs reviewed: Acceptable for surgery. (all labs ordered are listed, but only abnormal results are displayed)  Labs Reviewed  GLUCOSE, CAPILLARY - Abnormal; Notable for the following components:      Result Value   Glucose-Capillary 143 (*)    All other components within normal limits  CBC - Abnormal; Notable for the following components:   RBC 3.56 (*)    Hemoglobin 11.0 (*)    MCV 102.5 (*)    All other components within normal limits  COMPREHENSIVE METABOLIC PANEL - Abnormal; Notable for the following components:   Glucose, Bld 209 (*)    Creatinine, Ser 1.39 (*)    GFR calc non Af Amer 39 (*)    GFR calc Af Amer 45 (*)    All other components within normal limits  SURGICAL PCR SCREEN     IMAGES: MRI C-spine 05/16/18: IMPRESSION: 1. No acute osseous or cord signal abnormality. No cord compression. 2. Multilevel advanced disc and facet degenerative changes of the cervical spine are mild progression at the C6-7 and C7-T1 levels from 2015. 3. Mild reversal of cervical curvature and grade 1 C7-T1 anterolisthesis. 4. Moderate C5-6 and C6-7 multifactorial canal stenosis with slight cord impingement. No high-grade canal stenosis. 5. Severe right C3-4 and severe bilateral C6-7 neural foraminal stenosis. Multilevel mild and moderate foraminal stenosis.   EKG: 09/07/18: NSR  CV: Nuclear stress test 07/23/17:  IMPRESSION: 1. No reversible ischemia or infarction. 2. Normal left ventricular wall motion. 3. Left ventricular ejection fraction 69% 4. Non invasive risk stratification*: Low  Cardiac cath 02/24/07 (Dr. Terrence Dupont):  FINDINGS:  LV showed good LV systolic function, LVH EF of 60-65%. Left main was long which was patent. LAD has 10-15% mid stenosis. Diagonalone is less than 1.5 mm, has 60-70% sequential mid stenosis. Diagonal 2is  very very small which is patent. Ramus is patent proximally and thenbifurcates into superior and inferior branch. Superior branch is smallwhich has 70-80% focal stenosis. Inferior branch is a larger branchwhich is patent. Left circumflex is patent. OM1 is very small which ispatent. OM2 is small which is patent. RCA is patent. Plan is to maximize antianginal medications. If she continues to haverecurrent chest pain, then we will consider PCI to ramus branch.   Past Medical History:  Diagnosis Date  . Anxiety   . Arthritis   . Blood dyscrasia    "FREE BLEEDER"  . CERVICAL RADICULOPATHY, LEFT   . Chronic back pain   . CKD (chronic kidney disease)    DR. SANFORD  [facility]  . COMMON MIGRAINE   . CORONARY ARTERY DISEASE   . Cough    CURRENT COLD  . Cystitis   . DEPRESSION   . Diabetes mellitus, type II ([place])   . DIVERTICULOSIS-COLON   . Dysrhythmia    palpitations  . Gastric ulcer 04/2008  . Gastroparesis   . GERD (gastroesophageal reflux disease)   . Hiatal hernia   . Hyperlipidemia   . Hypertension   . Hypothyroidism   . INSOMNIA-SLEEP DISORDER-UNSPEC   . Iron deficiency anemia   . Wears glasses     Past Surgical History:  Procedure Laterality Date  . ABDOMINAL HYSTERECTOMY    . ANTERIOR LAT LUMBAR FUSION Left 08/13/2017   Procedure: LEFT SIDED LUMBAR 3-4 LATERAL INTERBODY FUSION WITH INSTRUMENTATION AND ALLOGRAFT;  Surgeon: Phylliss Bob, MD;  Location: [facility];  Service: Orthopedics;  Laterality: Left;  LEFT SIDED LUMBAR 3-4 LATERAL INTERBODY FUSION WITH INSTRUMENTATION AND ALLOGRAFT; REQUEST 3 HOURS  . Back Stimulator  07/2018  . BACK SURGERY  03/2016  . BREAST EXCISIONAL BIOPSY Right    40 years ago  . CHOLECYSTECTOMY  06/2009  . COLONOSCOPY    . ESOPHAGOGASTRODUODENOSCOPY    . EYE SURGERY Bilateral    lasik  . Gastric Wedge resection lipoma  11/2007   x2 with laparotomy and gastrostomy  . JOINT REPLACEMENT    . Left knee replacement    . Rigth  knee replacement with revision  04/2008   Dr. Berenice Primas  . ROTATOR CUFF REPAIR Left 01/2009  . s/p bladder surgury  09/2009   Dr. Terance Hart    MEDICATIONS: . acetaminophen-codeine (TYLENOL #3) 300-30 MG tablet  . aspirin 81 MG EC tablet  . atorvastatin (LIPITOR) 40 MG tablet  . buPROPion (WELLBUTRIN XL) 150 MG 24 hr tablet  . Continuous Blood Gluc Receiver (FREESTYLE LIBRE 14 DAY READER) DEVI  . cyclobenzaprine (FLEXERIL) 10 MG tablet  . ELMIRON 100 MG capsule  . estradiol (CLIMARA - DOSED IN MG/24 HR) 0.1 mg/24hr patch  . FLUoxetine (PROZAC) 20 MG capsule  . gabapentin (NEURONTIN) 100 MG capsule  . glucose blood (FREESTYLE TEST STRIPS) test strip  . insulin aspart (NOVOLOG FLEXPEN) 100 UNIT/ML FlexPen  . Insulin Glargine-Lixisenatide (SOLIQUA) 100-33 UNT-MCG/ML SOPN  . levETIRAcetam (KEPPRA) 250 MG tablet  . levothyroxine (SYNTHROID, LEVOTHROID) 125 MCG tablet  .  lidocaine (LIDODERM) 5 %  . linaclotide (LINZESS) 72 MCG capsule  . losartan (COZAAR) 100 MG tablet  . metoCLOPramide (REGLAN) 5 MG tablet  . naltrexone (DEPADE) 50 MG tablet  . ondansetron (ZOFRAN ODT) 4 MG disintegrating tablet  . progesterone (PROMETRIUM) 100 MG capsule  . TOBRAMYCIN-DEXAMETHASONE OP  . torsemide (DEMADEX) 20 MG tablet  . traMADol (ULTRAM) 50 MG tablet  . trimethoprim (TRIMPEX) 100 MG tablet   No current facility-administered medications for this encounter.     George Hugh [facility] Short Stay Center/Anesthesiology Phone [phone] 09/08/2018 12:43 PM

## 2018-09-09 ENCOUNTER — Telehealth: Payer: Self-pay | Admitting: Endocrinology

## 2018-09-09 NOTE — Telephone Encounter (Signed)
Pt was called and told she could come pick up one sensor to last her until her shipment comes in. Pt was also urged to call her supply company to get an approximate delivery date.

## 2018-09-09 NOTE — Telephone Encounter (Signed)
Patient states they are calling in regards to Continuous Blood Gluc Receiver (FREESTYLE LIBRE 14 DAY READER) DEVI, Needing a loaner because the patients has not arrived yet. Patient requested a cal. Please Advise, thanks

## 2018-09-10 MED ORDER — VANCOMYCIN HCL 10 G IV SOLR
1500.0000 mg | INTRAVENOUS | Status: AC
  Administered 2018-09-11: 1500 mg via INTRAVENOUS
  Filled 2018-09-10: qty 1500

## 2018-09-10 NOTE — H&P (Signed)
[name] is an 67 y.o. female.   Chief Complaint: Back pain with radiation into the lower extremities HPI:  Morbidly obese woman with multiple comorbidities, and associated degenerative joint disease, as well as history degenerative disc disease.  She underwent L3-4 decompression and fusion with Dr. 2 months key in 2018, but did not have any improvement in her low back pain and radiculopathy.  She has had difficulty with mobility, secondary to her back pain.  Unfortunately, her decreased mobility is interfering with her other goals of weight loss, and better health overall.   With failure of other conservative measures she underwent a trial of spinal cord stimulation therapy after appropriate psychological evaluation.  She had an excellent trial with substantial improvement in her pain control, and increased mobility.  She now presents for permanent implantation  Past Medical History:  Diagnosis Date  . Anxiety   . Arthritis   . Blood dyscrasia    "FREE BLEEDER"  . CERVICAL RADICULOPATHY, LEFT   . Chronic back pain   . CKD (chronic kidney disease)    DR. SANFORD  [facility]  . COMMON MIGRAINE   . CORONARY ARTERY DISEASE   . Cough    CURRENT COLD  . Cystitis   . DEPRESSION   . Diabetes mellitus, type II ([place])   . DIVERTICULOSIS-COLON   . Dysrhythmia    palpitations  . Gastric ulcer 04/2008  . Gastroparesis   . GERD (gastroesophageal reflux disease)   . Hiatal hernia   . Hyperlipidemia   . Hypertension   . Hypothyroidism   . INSOMNIA-SLEEP DISORDER-UNSPEC   . Iron deficiency anemia   . Wears glasses     Past Surgical History:  Procedure Laterality Date  . ABDOMINAL HYSTERECTOMY    . ANTERIOR LAT LUMBAR FUSION Left 08/13/2017   Procedure: LEFT SIDED LUMBAR 3-4 LATERAL INTERBODY FUSION WITH INSTRUMENTATION AND ALLOGRAFT;  Surgeon: Phylliss Bob, MD;  Location: [facility];  Service: Orthopedics;  Laterality: Left;  LEFT SIDED LUMBAR 3-4 LATERAL INTERBODY FUSION WITH  INSTRUMENTATION AND ALLOGRAFT; REQUEST 3 HOURS  . Back Stimulator  07/2018  . BACK SURGERY  03/2016  . BREAST EXCISIONAL BIOPSY Right    40 years ago  . CHOLECYSTECTOMY  06/2009  . COLONOSCOPY    . ESOPHAGOGASTRODUODENOSCOPY    . EYE SURGERY Bilateral    lasik  . Gastric Wedge resection lipoma  11/2007   x2 with laparotomy and gastrostomy  . JOINT REPLACEMENT    . Left knee replacement    . Rigth knee replacement with revision  04/2008   Dr. Berenice Primas  . ROTATOR CUFF REPAIR Left 01/2009  . s/p bladder surgury  09/2009   Dr. Terance Hart    Family History  Problem Relation Age of Onset  . Diabetes Sister        x 3  . Heart disease Sister        x2  . Breast cancer Sister 28  . Diabetes Brother        x3  . Heart disease Brother        x2  . Coronary artery disease Other        female 1st degree  . Hypertension Other   . Breast cancer Sister 62  . Breast cancer Other 25  . Diabetes Brother   . Colon cancer Neg Hx    Social History:  reports that she has quit smoking. She has never used smokeless tobacco. She reports that she does not drink alcohol  or use drugs.  Allergies:  Allergies  Allergen Reactions  . Ciprofloxacin Shortness Of Breath and Other (See Comments)    Dizziness   . Hydrocodone Shortness Of Breath and Other (See Comments)    Tachycardia  . Penicillins Hives    Pt just remembers hives Has patient had a PCN reaction causing immediate rash, facial/tongue/throat swelling, SOB or lightheadedness with hypotension: No Has patient had a PCN reaction causing severe rash involving mucus membranes or skin necrosis: No Has patient had a PCN reaction that required hospitalization No Has patient had a PCN reaction occurring within the last 10 years: No If all of the above answers are "NO", then may proceed with Cephalosporin use.     Medications Prior to Admission  Medication Sig Dispense Refill  . acetaminophen-codeine (TYLENOL #3) 300-30 MG tablet Take 1-2 tablets  by mouth every 6 (six) hours as needed for moderate pain.     Marland Kitchen aspirin 81 MG EC tablet Take 81 mg by mouth every evening.     Marland Kitchen atorvastatin (LIPITOR) 40 MG tablet Take 1 tablet (40 mg total) by mouth daily. (Patient taking differently: Take 40 mg by mouth at bedtime. ) 30 tablet 3  . buPROPion (WELLBUTRIN XL) 150 MG 24 hr tablet Take 1 tablet (150 mg total) by mouth daily. 90 tablet 3  . cyclobenzaprine (FLEXERIL) 10 MG tablet Take 10 mg by mouth 2 (two) times daily.    Marland Kitchen ELMIRON 100 MG capsule Take 200 mg by mouth 2 (two) times daily.     Marland Kitchen estradiol (CLIMARA - DOSED IN MG/24 HR) 0.1 mg/24hr patch Place 1 patch (0.1 mg total) onto the skin once a week. 12 patch 4  . FLUoxetine (PROZAC) 20 MG capsule TAKE 20mg  BY MOUTH DAILY (Patient taking differently: Take 20 mg by mouth daily. TAKE 20mg  BY MOUTH DAILY) 90 capsule 3  . glucose blood (FREESTYLE TEST STRIPS) test strip Use as instructed to check blood sugar 4 times daily. 100 each 3  . insulin aspart (NOVOLOG FLEXPEN) 100 UNIT/ML FlexPen 5 to 7 units before meals 15 mL 1  . Insulin Glargine-Lixisenatide (SOLIQUA) 100-33 UNT-MCG/ML SOPN Inject 26 Units into the skin daily with breakfast. 3 pen 3  . levETIRAcetam (KEPPRA) 250 MG tablet Take 500 mg by mouth 2 (two) times daily.     Marland Kitchen levothyroxine (SYNTHROID, LEVOTHROID) 125 MCG tablet 1 tablet daily and extra half tablet on Sundays 96 tablet 3  . linaclotide (LINZESS) 72 MCG capsule Take 1 capsule (72 mcg total) by mouth daily before breakfast. 30 capsule 1  . losartan (COZAAR) 100 MG tablet Take 1 tablet (100 mg total) by mouth daily. 90 tablet 3  . metoCLOPramide (REGLAN) 5 MG tablet Take 1 tablet (5 mg total) by mouth 2 (two) times daily. Take 30  Minutes before breakfast and dinner. 60 tablet 1  . naltrexone (DEPADE) 50 MG tablet Take 1 tablet (50 mg total) by mouth daily. 90 tablet 3  . progesterone (PROMETRIUM) 100 MG capsule Take 1 capsule (100 mg total) by mouth at bedtime. 90 capsule 4  .  TOBRAMYCIN-DEXAMETHASONE OP Place 1 drop into both eyes 2 (two) times daily.    Marland Kitchen torsemide (DEMADEX) 20 MG tablet Take 20 mg by mouth daily.    . traMADol (ULTRAM) 50 MG tablet Take 50 mg by mouth every 6 (six) hours as needed for moderate pain.    Marland Kitchen trimethoprim (TRIMPEX) 100 MG tablet Take 100 mg by mouth at bedtime.    Marland Kitchen  Continuous Blood Gluc Receiver (FREESTYLE LIBRE 14 DAY READER) DEVI by Does not apply route.    . gabapentin (NEURONTIN) 100 MG capsule Take 1 capsule (100 mg total) by mouth 3 (three) times daily. 90 capsule 3  . lidocaine (LIDODERM) 5 % Place 1 patch onto the skin daily. Remove & Discard patch within 12 hours or as directed by MD 60 patch 5  . ondansetron (ZOFRAN ODT) 4 MG disintegrating tablet Take 1 tablet (4 mg total) by mouth every 8 (eight) hours as needed. 10 tablet 0    Results for orders placed or performed during the hospital encounter of 09/11/18 (from the past 48 hour(s))  Glucose, capillary     Status: Abnormal   Collection Time: 09/11/18  9:22 AM  Result Value Ref Range   Glucose-Capillary 119 (H) 70 - 99 mg/dL  Glucose, capillary     Status: None   Collection Time: 09/11/18 11:25 AM  Result Value Ref Range   Glucose-Capillary 87 70 - 99 mg/dL   No results found.  Review of Systems  Constitutional: Negative.   HENT: Negative.   Eyes: Negative.   Respiratory: Negative.   Cardiovascular: Negative.   Gastrointestinal: Negative.   Genitourinary: Negative.   Musculoskeletal: Positive for back pain. Negative for falls and myalgias.  Skin: Negative.   Neurological: Negative.   Endo/Heme/Allergies: Negative.   Psychiatric/Behavioral: Negative.     Blood pressure (!) 163/77, pulse 85, temperature 97.9 F (36.6 C), temperature source Oral, resp. rate 18, height 5\' 6"  (1.676 m), weight 125.6 kg, SpO2 100 %. Physical Exam  Constitutional: She appears well-developed and well-nourished.  HENT:  Head: Normocephalic and atraumatic.  Eyes: Pupils are  equal, round, and reactive to light. EOM are normal.  Neck: Normal range of motion.  Cardiovascular: Normal rate.  Respiratory: Effort normal.  Musculoskeletal: Normal range of motion.  Neurological: She is alert.  Skin: Skin is warm and dry.  Psychiatric: She has a normal mood and affect. Thought content normal.     Assessment/Plan  chronic pain syndrome, lumbar post-laminectomy syndrome Plan:  S CS implantation, [employer]  Bonna Gains, MD 09/11/2018, 12:12 PM

## 2018-09-11 ENCOUNTER — Other Ambulatory Visit: Payer: Self-pay

## 2018-09-11 ENCOUNTER — Ambulatory Visit (HOSPITAL_COMMUNITY): Payer: Medicare Other | Admitting: Anesthesiology

## 2018-09-11 ENCOUNTER — Ambulatory Visit (HOSPITAL_COMMUNITY): Payer: Medicare Other

## 2018-09-11 ENCOUNTER — Encounter (HOSPITAL_COMMUNITY)
Admission: RE | Disposition: A | Discharge status: Home / Self Care | Payer: Self-pay | Source: Ambulatory Visit | Attending: Anesthesiology

## 2018-09-11 ENCOUNTER — Ambulatory Visit (HOSPITAL_COMMUNITY): Payer: Medicare Other | Admitting: Physician Assistant

## 2018-09-11 ENCOUNTER — Encounter (HOSPITAL_COMMUNITY): Payer: Self-pay

## 2018-09-11 ENCOUNTER — Ambulatory Visit (HOSPITAL_COMMUNITY)
Admission: RE | Admit: 2018-09-11 | Discharge: 2018-09-11 | Disposition: A | Discharge status: Home / Self Care | Payer: Medicare Other | Source: Ambulatory Visit | Attending: Anesthesiology | Admitting: Anesthesiology

## 2018-09-11 DIAGNOSIS — I129 Hypertensive chronic kidney disease with stage 1 through stage 4 chronic kidney disease, or unspecified chronic kidney disease: Secondary | ICD-10-CM | POA: Diagnosis not present

## 2018-09-11 DIAGNOSIS — Z87891 Personal history of nicotine dependence: Secondary | ICD-10-CM | POA: Insufficient documentation

## 2018-09-11 DIAGNOSIS — E1143 Type 2 diabetes mellitus with diabetic autonomic (poly)neuropathy: Secondary | ICD-10-CM | POA: Diagnosis not present

## 2018-09-11 DIAGNOSIS — E1122 Type 2 diabetes mellitus with diabetic chronic kidney disease: Secondary | ICD-10-CM | POA: Insufficient documentation

## 2018-09-11 DIAGNOSIS — Z794 Long term (current) use of insulin: Secondary | ICD-10-CM | POA: Diagnosis not present

## 2018-09-11 DIAGNOSIS — M961 Postlaminectomy syndrome, not elsewhere classified: Secondary | ICD-10-CM | POA: Diagnosis not present

## 2018-09-11 DIAGNOSIS — Z969 Presence of functional implant, unspecified: Secondary | ICD-10-CM | POA: Diagnosis not present

## 2018-09-11 DIAGNOSIS — I10 Essential (primary) hypertension: Secondary | ICD-10-CM | POA: Diagnosis not present

## 2018-09-11 DIAGNOSIS — G894 Chronic pain syndrome: Secondary | ICD-10-CM | POA: Insufficient documentation

## 2018-09-11 DIAGNOSIS — M1990 Unspecified osteoarthritis, unspecified site: Secondary | ICD-10-CM | POA: Insufficient documentation

## 2018-09-11 DIAGNOSIS — E039 Hypothyroidism, unspecified: Secondary | ICD-10-CM | POA: Diagnosis not present

## 2018-09-11 DIAGNOSIS — Z79890 Hormone replacement therapy (postmenopausal): Secondary | ICD-10-CM | POA: Diagnosis not present

## 2018-09-11 DIAGNOSIS — Z7982 Long term (current) use of aspirin: Secondary | ICD-10-CM | POA: Insufficient documentation

## 2018-09-11 DIAGNOSIS — Z6841 Body Mass Index (BMI) 40.0 and over, adult: Secondary | ICD-10-CM | POA: Diagnosis not present

## 2018-09-11 DIAGNOSIS — Z79899 Other long term (current) drug therapy: Secondary | ICD-10-CM | POA: Insufficient documentation

## 2018-09-11 DIAGNOSIS — N189 Chronic kidney disease, unspecified: Secondary | ICD-10-CM | POA: Insufficient documentation

## 2018-09-11 DIAGNOSIS — Z419 Encounter for procedure for purposes other than remedying health state, unspecified: Secondary | ICD-10-CM

## 2018-09-11 DIAGNOSIS — I2510 Atherosclerotic heart disease of native coronary artery without angina pectoris: Secondary | ICD-10-CM | POA: Insufficient documentation

## 2018-09-11 HISTORY — PX: SPINAL CORD STIMULATOR INSERTION: SHX5378

## 2018-09-11 LAB — GLUCOSE, CAPILLARY
Glucose-Capillary: 119 mg/dL — ABNORMAL HIGH (ref 70–99)
Glucose-Capillary: 87 mg/dL (ref 70–99)
Glucose-Capillary: 100 mg/dL — ABNORMAL HIGH (ref 70–99)

## 2018-09-11 SURGERY — INSERTION, SPINAL CORD STIMULATOR, LUMBAR
Anesthesia: General | Site: Spine Lumbar

## 2018-09-11 MED ORDER — FENTANYL CITRATE (PF) 250 MCG/5ML IJ SOLN
INTRAMUSCULAR | Status: AC
  Filled 2018-09-11: qty 5

## 2018-09-11 MED ORDER — PROPOFOL 10 MG/ML IV BOLUS
INTRAVENOUS | Status: DC | PRN
  Administered 2018-09-11: 160 mg via INTRAVENOUS

## 2018-09-11 MED ORDER — BUPIVACAINE-EPINEPHRINE 0.25% -1:200000 IJ SOLN
INTRAMUSCULAR | Status: AC
  Filled 2018-09-11: qty 1

## 2018-09-11 MED ORDER — ONDANSETRON HCL 4 MG/2ML IJ SOLN
INTRAMUSCULAR | Status: DC | PRN
  Administered 2018-09-11: 4 mg via INTRAVENOUS

## 2018-09-11 MED ORDER — BUPIVACAINE-EPINEPHRINE 0.25% -1:200000 IJ SOLN
INTRAMUSCULAR | Status: DC | PRN
  Administered 2018-09-11: 17 mL
  Administered 2018-09-11: 10 mL

## 2018-09-11 MED ORDER — CHLORHEXIDINE GLUCONATE CLOTH 2 % EX PADS: 6.0000 | MEDICATED_PAD | Freq: Once | CUTANEOUS | Status: DC

## 2018-09-11 MED ORDER — OXYCODONE HCL 5 MG/5ML PO SOLN: 5.0000 mg | Freq: Once | ORAL | Status: AC | PRN

## 2018-09-11 MED ORDER — CLINDAMYCIN HCL 150 MG PO CAPS: 150.0000 mg | ORAL_CAPSULE | Freq: Four times a day (QID) | ORAL | 0 refills | Status: AC

## 2018-09-11 MED ORDER — FENTANYL CITRATE (PF) 100 MCG/2ML IJ SOLN
25.0000 ug | INTRAMUSCULAR | Status: DC | PRN
  Administered 2018-09-11: 50 ug via INTRAVENOUS

## 2018-09-11 MED ORDER — BACITRACIN-NEOMYCIN-POLYMYXIN 400-5-5000 EX OINT
TOPICAL_OINTMENT | CUTANEOUS | Status: AC
  Filled 2018-09-11: qty 1

## 2018-09-11 MED ORDER — 0.9 % SODIUM CHLORIDE (POUR BTL) OPTIME
TOPICAL | Status: DC | PRN
  Administered 2018-09-11: 1000 mL

## 2018-09-11 MED ORDER — SUCCINYLCHOLINE CHLORIDE 200 MG/10ML IV SOSY
PREFILLED_SYRINGE | INTRAVENOUS | Status: AC
  Filled 2018-09-11: qty 10

## 2018-09-11 MED ORDER — LABETALOL HCL 5 MG/ML IV SOLN
INTRAVENOUS | Status: AC
  Filled 2018-09-11: qty 4

## 2018-09-11 MED ORDER — MIDAZOLAM HCL 5 MG/5ML IJ SOLN
INTRAMUSCULAR | Status: DC | PRN
  Administered 2018-09-11: 2 mg via INTRAVENOUS

## 2018-09-11 MED ORDER — OXYCODONE HCL 5 MG PO TABS
ORAL_TABLET | ORAL | Status: AC
  Filled 2018-09-11: qty 1

## 2018-09-11 MED ORDER — SODIUM CHLORIDE (PF) 0.9 % IJ SOLN
INTRAMUSCULAR | Status: AC
  Filled 2018-09-11: qty 20

## 2018-09-11 MED ORDER — ROCURONIUM BROMIDE 10 MG/ML (PF) SYRINGE
PREFILLED_SYRINGE | INTRAVENOUS | Status: DC | PRN
  Administered 2018-09-11: 50 mg via INTRAVENOUS
  Administered 2018-09-11: 10 mg via INTRAVENOUS

## 2018-09-11 MED ORDER — ONDANSETRON HCL 4 MG/2ML IJ SOLN
INTRAMUSCULAR | Status: AC
  Filled 2018-09-11: qty 2

## 2018-09-11 MED ORDER — ONDANSETRON HCL 4 MG/2ML IJ SOLN: 4.0000 mg | Freq: Four times a day (QID) | INTRAMUSCULAR | Status: DC | PRN

## 2018-09-11 MED ORDER — OXYCODONE-ACETAMINOPHEN 7.5-325 MG PO TABS: 1.0000 | ORAL_TABLET | ORAL | 0 refills | Status: DC | PRN

## 2018-09-11 MED ORDER — LACTATED RINGERS IV SOLN
INTRAVENOUS | Status: DC
  Administered 2018-09-11 (×2): via INTRAVENOUS

## 2018-09-11 MED ORDER — LABETALOL HCL 5 MG/ML IV SOLN
10.0000 mg | Freq: Once | INTRAVENOUS | Status: AC
  Administered 2018-09-11: 10 mg via INTRAVENOUS

## 2018-09-11 MED ORDER — PHENYLEPHRINE 40 MCG/ML (10ML) SYRINGE FOR IV PUSH (FOR BLOOD PRESSURE SUPPORT)
PREFILLED_SYRINGE | INTRAVENOUS | Status: AC
  Filled 2018-09-11: qty 10

## 2018-09-11 MED ORDER — LIDOCAINE HCL (CARDIAC) PF 100 MG/5ML IV SOSY
PREFILLED_SYRINGE | INTRAVENOUS | Status: DC | PRN
  Administered 2018-09-11: 60 mg via INTRAVENOUS

## 2018-09-11 MED ORDER — DEXAMETHASONE SODIUM PHOSPHATE 10 MG/ML IJ SOLN
INTRAMUSCULAR | Status: AC
  Filled 2018-09-11: qty 2

## 2018-09-11 MED ORDER — PROPOFOL 1000 MG/100ML IV EMUL
INTRAVENOUS | Status: AC
  Filled 2018-09-11: qty 200

## 2018-09-11 MED ORDER — MIDAZOLAM HCL 2 MG/2ML IJ SOLN
INTRAMUSCULAR | Status: AC
  Filled 2018-09-11: qty 2

## 2018-09-11 MED ORDER — BACITRACIN-NEOMYCIN-POLYMYXIN OINTMENT TUBE
TOPICAL_OINTMENT | CUTANEOUS | Status: DC | PRN
  Administered 2018-09-11: 1 via TOPICAL

## 2018-09-11 MED ORDER — FENTANYL CITRATE (PF) 100 MCG/2ML IJ SOLN
INTRAMUSCULAR | Status: AC
  Filled 2018-09-11: qty 2

## 2018-09-11 MED ORDER — PROPOFOL 10 MG/ML IV BOLUS
INTRAVENOUS | Status: AC
  Filled 2018-09-11: qty 20

## 2018-09-11 MED ORDER — ROCURONIUM BROMIDE 50 MG/5ML IV SOSY
PREFILLED_SYRINGE | INTRAVENOUS | Status: AC
  Filled 2018-09-11: qty 15

## 2018-09-11 MED ORDER — DEXAMETHASONE SODIUM PHOSPHATE 10 MG/ML IJ SOLN
INTRAMUSCULAR | Status: DC | PRN
  Administered 2018-09-11: 4 mg via INTRAVENOUS

## 2018-09-11 MED ORDER — SODIUM CHLORIDE 0.9 % IV SOLN
INTRAVENOUS | Status: DC | PRN
  Administered 2018-09-11: 11:00:00

## 2018-09-11 MED ORDER — FENTANYL CITRATE (PF) 100 MCG/2ML IJ SOLN
INTRAMUSCULAR | Status: DC | PRN
  Administered 2018-09-11 (×2): 50 ug via INTRAVENOUS
  Administered 2018-09-11: 100 ug via INTRAVENOUS
  Administered 2018-09-11: 50 ug via INTRAVENOUS

## 2018-09-11 MED ORDER — LIDOCAINE 2% (20 MG/ML) 5 ML SYRINGE
INTRAMUSCULAR | Status: AC
  Filled 2018-09-11: qty 10

## 2018-09-11 MED ORDER — EPHEDRINE 5 MG/ML INJ
INTRAVENOUS | Status: AC
  Filled 2018-09-11: qty 10

## 2018-09-11 MED ORDER — OXYCODONE HCL 5 MG PO TABS
5.0000 mg | ORAL_TABLET | Freq: Once | ORAL | Status: AC | PRN
  Administered 2018-09-11: 5 mg via ORAL

## 2018-09-11 SURGICAL SUPPLY — 68 items
ANCHOR CLIK X NEURO (Stimulator) ×2 IMPLANT
BAG DECANTER FOR FLEXI CONT (MISCELLANEOUS) ×2 IMPLANT
BENZOIN TINCTURE PRP APPL 2/3 (GAUZE/BANDAGES/DRESSINGS) IMPLANT
BINDER ABDOMINAL 12 ML 46-62 (SOFTGOODS) ×2 IMPLANT
BLADE 15 SAFETY STRL DISP (BLADE) ×2 IMPLANT
BLADE CLIPPER SURG (BLADE) IMPLANT
CHLORAPREP W/TINT 26ML (MISCELLANEOUS) ×2 IMPLANT
CLIP VESOCCLUDE SM WIDE 6/CT (CLIP) IMPLANT
COVER WAND RF STERILE (DRAPES) ×2 IMPLANT
DERMABOND ADVANCED (GAUZE/BANDAGES/DRESSINGS) ×1
DERMABOND ADVANCED .7 DNX12 (GAUZE/BANDAGES/DRESSINGS) ×1 IMPLANT
DRAPE C-ARM 42X72 X-RAY (DRAPES) ×2 IMPLANT
DRAPE C-ARMOR (DRAPES) ×2 IMPLANT
DRAPE LAPAROTOMY 100X72X124 (DRAPES) ×2 IMPLANT
DRAPE POUCH INSTRU U-SHP 10X18 (DRAPES) ×2 IMPLANT
DRAPE SURG 17X23 STRL (DRAPES) ×2 IMPLANT
DRSG OPSITE POSTOP 3X4 (GAUZE/BANDAGES/DRESSINGS) IMPLANT
DRSG OPSITE POSTOP 4X6 (GAUZE/BANDAGES/DRESSINGS) ×4 IMPLANT
ELECT REM PT RETURN 9FT ADLT (ELECTROSURGICAL) ×2
ELECTRODE REM PT RTRN 9FT ADLT (ELECTROSURGICAL) ×1 IMPLANT
GAUZE 4X4 16PLY RFD (DISPOSABLE) ×2 IMPLANT
GENERATOR NEUROSTIMULATOR (Neurostimulator) ×2 IMPLANT
GLOVE BIOGEL PI IND STRL 7.0 (GLOVE) ×3 IMPLANT
GLOVE BIOGEL PI IND STRL 7.5 (GLOVE) ×1 IMPLANT
GLOVE BIOGEL PI INDICATOR 7.0 (GLOVE) ×3
GLOVE BIOGEL PI INDICATOR 7.5 (GLOVE) ×1
GLOVE ECLIPSE 7.5 STRL STRAW (GLOVE) ×2 IMPLANT
GLOVE EXAM NITRILE LRG STRL (GLOVE) IMPLANT
GLOVE EXAM NITRILE XL STR (GLOVE) IMPLANT
GLOVE EXAM NITRILE XS STR PU (GLOVE) IMPLANT
GOWN STRL REUS W/ TWL LRG LVL3 (GOWN DISPOSABLE) IMPLANT
GOWN STRL REUS W/ TWL XL LVL3 (GOWN DISPOSABLE) IMPLANT
GOWN STRL REUS W/TWL 2XL LVL3 (GOWN DISPOSABLE) IMPLANT
GOWN STRL REUS W/TWL LRG LVL3 (GOWN DISPOSABLE)
GOWN STRL REUS W/TWL XL LVL3 (GOWN DISPOSABLE)
KIT BASIN OR (CUSTOM PROCEDURE TRAY) ×2 IMPLANT
KIT CHARGING (KITS) ×1
KIT CHARGING PRECISION NEURO (KITS) ×1 IMPLANT
KIT REMOTE CONTROL 112802 FREE (KITS) ×2 IMPLANT
KIT TURNOVER KIT B (KITS) ×2 IMPLANT
LEAD INFINION CX PERC 70CM (Lead) ×4 IMPLANT
NEEDLE 18GX1X1/2 (RX/OR ONLY) (NEEDLE) IMPLANT
NEEDLE ENTRADA 4.5IN (NEEDLE) ×4 IMPLANT
NEEDLE HYPO 25X1 1.5 SAFETY (NEEDLE) ×2 IMPLANT
NS IRRIG 1000ML POUR BTL (IV SOLUTION) ×2 IMPLANT
PACK LAMINECTOMY NEURO (CUSTOM PROCEDURE TRAY) ×2 IMPLANT
PAD ARMBOARD 7.5X6 YLW CONV (MISCELLANEOUS) ×2 IMPLANT
SPONGE LAP 4X18 RFD (DISPOSABLE) ×2 IMPLANT
SPONGE SURGIFOAM ABS GEL SZ50 (HEMOSTASIS) IMPLANT
STAPLER SKIN PROX WIDE 3.9 (STAPLE) ×2 IMPLANT
STRIP CLOSURE SKIN 1/2X4 (GAUZE/BANDAGES/DRESSINGS) IMPLANT
SUT MNCRL AB 4-0 PS2 18 (SUTURE) IMPLANT
SUT SILK 0 (SUTURE) ×1
SUT SILK 0 MO-6 18XCR BRD 8 (SUTURE) ×1 IMPLANT
SUT SILK 0 TIES 10X30 (SUTURE) IMPLANT
SUT SILK 2 0 TIES 10X30 (SUTURE) IMPLANT
SUT VIC AB 0 CT1 18XCR BRD8 (SUTURE) ×1 IMPLANT
SUT VIC AB 0 CT1 8-18 (SUTURE) ×1
SUT VIC AB 2-0 CP2 18 (SUTURE) ×4 IMPLANT
SUT VIC AB 2-0 CT1 18 (SUTURE) ×2 IMPLANT
SYR 10ML LL (SYRINGE) IMPLANT
SYR EPIDURAL 5ML GLASS (SYRINGE) ×2 IMPLANT
TOOL LONG TUNNEL (SPINAL CORD STIMULATOR) ×2 IMPLANT
TOWEL GREEN STERILE (TOWEL DISPOSABLE) ×2 IMPLANT
TOWEL GREEN STERILE FF (TOWEL DISPOSABLE) ×2 IMPLANT
WATER STERILE IRR 1000ML POUR (IV SOLUTION) ×2 IMPLANT
WRENCH HEX 7.6 (SPINAL CORD STIMULATOR) ×2 IMPLANT
YANKAUER SUCT BULB TIP NO VENT (SUCTIONS) ×2 IMPLANT

## 2018-09-11 NOTE — Progress Notes (Signed)
Spoke with dr singer re: bp as has not taken todays losartan / orders rec'd for iv labetalol

## 2018-09-11 NOTE — Discharge Instructions (Addendum)

## 2018-09-11 NOTE — Anesthesia Procedure Notes (Signed)
Procedure Name: Intubation Date/Time: 09/11/2018 12:31 PM Performed by: Carney Living, CRNA Pre-anesthesia Checklist: Patient identified, Emergency Drugs available, Suction available, Patient being monitored and Timeout performed Patient Re-evaluated:Patient Re-evaluated prior to induction Oxygen Delivery Method: Circle system utilized Preoxygenation: Pre-oxygenation with 100% oxygen Induction Type: IV induction Ventilation: Mask ventilation without difficulty and Oral airway inserted - appropriate to patient size Laryngoscope Size: Mac and 4 Grade View: Grade II Tube type: Oral Tube size: 7.5 mm Number of attempts: 1 Airway Equipment and Method: Stylet Placement Confirmation: ETT inserted through vocal cords under direct vision and breath sounds checked- equal and bilateral Secured at: 22 cm Tube secured with: Tape Dental Injury: Teeth and Oropharynx as per pre-operative assessment

## 2018-09-11 NOTE — Transfer of Care (Signed)
Immediate Anesthesia Transfer of Care Note  Patient: [name]  Procedure(s) Performed: LUMBAR SPINAL CORD STIMULATOR INSERTION (N/A Spine Lumbar)  Patient Location: PACU  Anesthesia Type:General  Level of Consciousness: drowsy and patient cooperative  Airway & Oxygen Therapy: Patient Spontanous Breathing and Patient connected to nasal cannula oxygen  Post-op Assessment: Report given to RN, Post -op Vital signs reviewed and stable and Patient moving all extremities X 4  Post vital signs: Reviewed and stable  Last Vitals:  Vitals Value Taken Time  BP 164/83 09/11/2018  2:26 PM  Temp    Pulse 91 09/11/2018  2:32 PM  Resp 10 09/11/2018  2:32 PM  SpO2 92 % 09/11/2018  2:32 PM  Vitals shown include unvalidated device data.  Last Pain:  Vitals:   09/11/18 0936  TempSrc:   PainSc: 7          Complications: No apparent anesthesia complications

## 2018-09-11 NOTE — Anesthesia Postprocedure Evaluation (Signed)
Anesthesia Post Note  Patient: [name]  Procedure(s) Performed: LUMBAR SPINAL CORD STIMULATOR INSERTION (N/A Spine Lumbar)     Patient location during evaluation: PACU Anesthesia Type: General Level of consciousness: sedated Pain management: pain level controlled Vital Signs Assessment: post-procedure vital signs reviewed and stable Respiratory status: spontaneous breathing and respiratory function stable Cardiovascular status: stable Postop Assessment: no apparent nausea or vomiting Anesthetic complications: no    Last Vitals:  Vitals:   09/11/18 1516 09/11/18 1535  BP:  (!) 136/92  Pulse: 72 64  Resp: 10 15  Temp:  36.7 C  SpO2: 97% 96%    Last Pain:  Vitals:   09/11/18 1516  TempSrc:   PainSc: Asleep                 Adele Milson DANIEL

## 2018-09-11 NOTE — Progress Notes (Signed)
Stimulator rep at bedside to program device. Device left ON , "kinda weak" per rep and at discretion of patient/ completed at 1502

## 2018-09-11 NOTE — Op Note (Signed)
PREOP DX: 1) chronic pain syndrome 2)lumbar post-laminectomy syndrome POSTOP MC:NOBS as preop PROCEDURES PERFORMED:1) intraop fluoro 2) placement of 2 16 contact [employer] Infinion leads 3) placement of Spectra SCS generator 4) post op complex SCS programming SURGEON:Sena Clouatre  ASSISTANT: NONE  ANESTHESIA:GETA EBL: <20cc  DESCRIPTION OF PROCEDURE: After a discussion of risks, benefits and alternatives, informed consent was obtained. The patient was taken to the OR,general anesthesia induced,turned prone onto a Jackson table, all pressure points padded, SCD's placed. A timeout was taken to verify the correct patient, position, personnel, availability of appropriate equipment, and administration of perioperative antibiotics.  The thoracic and lumbar areas were widely prepped with chloraprep and draped into a sterile field. Fluoroscopy was used to plan aLEFTparamedian incision at the L1-L2 levels, and an incision made with a 10 blade and carried down to the dorsolumbar fascia with the bovie and blunt dissection. Retractors were placed and a 14g [employer] tuohy needle placed into the epidural space at the T12-L1 interspace using biplanar fluoro and loss-of-resistance technique. The needle was aspirated without any return of fluid. A [employer] INFINION lead was introduced and under live AP fluoro advanced until the distal-most2contactsoverlay thesuperioraspectof theT7vertebral body shadow with the rest of the contacts distributed over theT8 and T9vertebral bodies in a position just left of anatomic midline. A second Infinion lead was placed just right of anatomic midline at the same levelsusing the same technique. 0 silk sutures were placed in the fascia adjacent to the needles. The needles and stylets were removed under fluoroscopy with no lead migration noted. Leads were then fixed to the fascia byClik anchorswith the sutures; repeat images were obtained to  verify that there had been no lead migration. The incision was inspected and hemostasis obtained with the bipolar cautery.    Attention was then turned to creation of a subcutaneous pocket. At theleftflank, a 3 cm incision was made with a 10 blade and using the bovie and blunt dissection a pocket of size appropriate to place a SCS generator. The pocket was trialed, and found to be of adequate size. The pocket was inspected for hemostasis, which was found to be excellent. Using reverse seldinger technique, the leads were tunneled to the pocket site, and the leads inserted into the SCS generator. Impedances were checked, and all found to be excellent. The leads were then all fixed into position with a self-torquing wrench. The wiring was all carefully coiled, placed behind the generator and placed in the pocket.  Both incisions were copiously irrigated with bacitracin-containing irrigation. The lumbar incision was closed in2  layers of interrupted 0 vicryl and the skin closed with staples.The pocket incision was closed with a deeper layer of 2-0 vicryl interrupted sutures, and the skin closed with staples.Sterile dressings were applied.   Needle, sponge, and instrument counts were correct x2 at the end of the case.    The patient was then carefully awakened from anesthesia, turned supine, an abdominal binder placed, and the patient taken to the recovery room where he underwent complex spinal cord stimulator programming.  COMPLICATIONS: NONE  CONDITION: Stable throughout the course of the procedure and immediately afterward  DISPOSITION:anticipatedischarge to home, with antibiotics and pain medicine. Discussed care with the patientandfamily. Followup in clinic will be scheduled in 10-14 days.

## 2018-09-14 ENCOUNTER — Encounter (HOSPITAL_COMMUNITY): Payer: Self-pay | Admitting: Anesthesiology

## 2018-09-15 ENCOUNTER — Other Ambulatory Visit: Payer: Self-pay | Admitting: Obstetrics & Gynecology

## 2018-09-18 ENCOUNTER — Telehealth: Payer: Self-pay | Admitting: *Deleted

## 2018-09-18 NOTE — Telephone Encounter (Signed)
Observe at that dosage as she is 67 yo and on maximum patch dosage.

## 2018-09-18 NOTE — Telephone Encounter (Signed)
Patient called c/o frequent hot flashes x 3 months, has been on climara patch weekly 0.1 since 07/2017. Was doing well without symptoms when first starting medication. Please advise

## 2018-09-18 NOTE — Telephone Encounter (Signed)
Patient informed. 

## 2018-09-23 ENCOUNTER — Telehealth: Payer: Self-pay

## 2018-09-23 NOTE — Telephone Encounter (Signed)
Received two boxes of Soliqua insulin from [employer] [facility]. Called pt and left detailed voicemail with this information.

## 2018-09-24 DIAGNOSIS — N3281 Overactive bladder: Secondary | ICD-10-CM | POA: Diagnosis not present

## 2018-09-24 DIAGNOSIS — N3010 Interstitial cystitis (chronic) without hematuria: Secondary | ICD-10-CM | POA: Diagnosis not present

## 2018-09-24 DIAGNOSIS — N3020 Other chronic cystitis without hematuria: Secondary | ICD-10-CM | POA: Diagnosis not present

## 2018-10-12 DIAGNOSIS — E113312 Type 2 diabetes mellitus with moderate nonproliferative diabetic retinopathy with macular edema, left eye: Secondary | ICD-10-CM | POA: Diagnosis not present

## 2018-10-13 DIAGNOSIS — M961 Postlaminectomy syndrome, not elsewhere classified: Secondary | ICD-10-CM | POA: Diagnosis not present

## 2018-10-13 DIAGNOSIS — Z9689 Presence of other specified functional implants: Secondary | ICD-10-CM | POA: Diagnosis not present

## 2018-10-13 DIAGNOSIS — I10 Essential (primary) hypertension: Secondary | ICD-10-CM | POA: Diagnosis not present

## 2018-10-13 DIAGNOSIS — Z6841 Body Mass Index (BMI) 40.0 and over, adult: Secondary | ICD-10-CM | POA: Diagnosis not present

## 2018-10-27 NOTE — Telephone Encounter (Signed)
Called pt again and left detailed voicemail informing pt that she still has medications that need to be picked up.

## 2018-11-02 ENCOUNTER — Other Ambulatory Visit (INDEPENDENT_AMBULATORY_CARE_PROVIDER_SITE_OTHER): Payer: Medicare Other

## 2018-11-02 ENCOUNTER — Telehealth: Payer: Self-pay | Admitting: Endocrinology

## 2018-11-02 DIAGNOSIS — Z794 Long term (current) use of insulin: Secondary | ICD-10-CM | POA: Diagnosis not present

## 2018-11-02 DIAGNOSIS — E1165 Type 2 diabetes mellitus with hyperglycemia: Secondary | ICD-10-CM | POA: Diagnosis not present

## 2018-11-02 DIAGNOSIS — E890 Postprocedural hypothyroidism: Secondary | ICD-10-CM

## 2018-11-02 LAB — TSH: TSH: 0.98 u[IU]/mL (ref 0.35–4.50)

## 2018-11-02 LAB — GLUCOSE, RANDOM: Glucose, Bld: 142 mg/dL — ABNORMAL HIGH (ref 70–99)

## 2018-11-02 LAB — T4, FREE: Free T4: 0.93 ng/dL (ref 0.60–1.60)

## 2018-11-02 LAB — T3, FREE: T3, Free: 2.8 pg/mL (ref 2.3–4.2)

## 2018-11-02 NOTE — Telephone Encounter (Signed)
error 

## 2018-11-03 LAB — FRUCTOSAMINE: Fructosamine: 350 umol/L — ABNORMAL HIGH (ref 0–285)

## 2018-11-04 DIAGNOSIS — N3010 Interstitial cystitis (chronic) without hematuria: Secondary | ICD-10-CM | POA: Diagnosis not present

## 2018-11-04 DIAGNOSIS — N3281 Overactive bladder: Secondary | ICD-10-CM | POA: Diagnosis not present

## 2018-11-05 ENCOUNTER — Other Ambulatory Visit: Payer: Self-pay

## 2018-11-05 ENCOUNTER — Encounter: Payer: Self-pay | Admitting: Endocrinology

## 2018-11-05 ENCOUNTER — Telehealth: Payer: Self-pay | Admitting: Endocrinology

## 2018-11-05 ENCOUNTER — Ambulatory Visit (INDEPENDENT_AMBULATORY_CARE_PROVIDER_SITE_OTHER): Payer: Medicare Other | Admitting: Endocrinology

## 2018-11-05 VITALS — BP 148/92 | HR 100 | Ht 66.0 in | Wt 281.2 lb

## 2018-11-05 DIAGNOSIS — E890 Postprocedural hypothyroidism: Secondary | ICD-10-CM | POA: Diagnosis not present

## 2018-11-05 DIAGNOSIS — Z794 Long term (current) use of insulin: Secondary | ICD-10-CM

## 2018-11-05 DIAGNOSIS — E1165 Type 2 diabetes mellitus with hyperglycemia: Secondary | ICD-10-CM | POA: Diagnosis not present

## 2018-11-05 DIAGNOSIS — I10 Essential (primary) hypertension: Secondary | ICD-10-CM

## 2018-11-05 MED ORDER — METOPROLOL SUCCINATE ER 50 MG PO TB24: 50.0000 mg | ORAL_TABLET | Freq: Every day | ORAL | 3 refills | Status: DC

## 2018-11-05 MED ORDER — INSULIN ISOPHANE HUMAN 100 UNIT/ML KWIKPEN: 7.0000 [IU] | PEN_INJECTOR | Freq: Three times a day (TID) | SUBCUTANEOUS | 11 refills | Status: DC

## 2018-11-05 NOTE — Telephone Encounter (Signed)
She was supposed to get a prescription for Novolin R Relion insulin vial from [place] and a prescription for Novolin N has been incorrectly sent.  Please cancel the prescription at [facility] and check with [facility] she will use to send the correct.  She will take 7-10 units before meals

## 2018-11-05 NOTE — Patient Instructions (Addendum)
Novolog or Novolin R (Relion) 7-10 units before meals especially supper;   Stop Lantus  Same thyroid dose

## 2018-11-05 NOTE — Progress Notes (Signed)
[name] is a 68 y.o. female.    Reason for Appointment: Diabetes follow-up   History of Present Illness   Diagnosis: Type 2 DIABETES MELITUS, date of diagnosis: 2000  Previous history: She had initially been treated with metformin and Amaryl and subsequently given Victoza which helped overall control and weight loss. In 9/13 because of significant hyperglycemia she was started on basal bolus insulin also. Had required relatively small doses of insulin. Her blood sugar control has been excellent with A1c between 5.3-6.0 although this may be relatively higher than expected. Tends to have relatively higher readings after supper In 6/15 she was told to resume her Victoza and stop her Lantus since she had been gaining weight with Lantus and NovoLog regimen.   Recent history:     INSULIN regimen: Novolog 0 units before meals;     Non-insulin hypoglycemic drugs: Soliqua 26 units daily 7-10 Lantus once or twice a day   Her A1c was most recently 6.7, previously 5.7 Fructosamine is high at 350  Current blood sugar patterns and management:  She has been checking her blood sugar again regularly again  This is despite having the freestyle libre which she is wearing consistently  Most of her readings are being checked in the morning  On the last visit she was told to start taking NovoLog insulin before her meals and she did not follow the directions and is taking Lantus instead.  She says that she does not have any NovoLog at home and cannot afford this  With this is blood sugars may be relatively low overnight as seen on her sensor  Does not always have symptoms with low blood sugar readings which have been mostly occurring during the night  Sugars have been as high as 248 documented after dinner He thinks he is trying to be a little more active but still limited in her walking ability    Side effects from medications:  none  Monitors blood glucose: Once a day or less  on average .    Glucometer:  Freestyle Libre/Accu-Chek .          Blood Glucose readings as follows  CGM use % of time  44  2-week average/SD  120+/-52  Time in range     6 7 %  % Time Above 180  14  % Time above 250   % Time Below 70  19     PRE-MEAL Fasting Lunch Dinner Bedtime Overall  Glucose range:       Averages:  79  88  153   120   POST-MEAL PC Breakfast PC Lunch PC Dinner  Glucose range:     Averages:  137   220    Meals: 1-2 meals per day. Lunch Supper at 7 PM pm           Physical activity: exercise: Limited, has had fatigue, back and leg pain.            Dietician visit: Most recent: 05/1190            Complications: are: None  Wt Readings from Last 3 Encounters:  11/05/18 281 lb 3.2 oz (127.6 kg)  09/11/18 277 lb (125.6 kg)  09/07/18 277 lb (125.6 kg)    LABS:  Lab Results  Component Value Date   HGBA1C 6.7 (H) 08/28/2018   HGBA1C 5.7 (A) 07/22/2018   HGBA1C 0 07/22/2018   HGBA1C 0 (A) 07/22/2018   HGBA1C 0.0 07/22/2018  Lab Results  Component Value Date   MICROALBUR 2.0 (H) 12/31/2017   LDLCALC 85 06/15/2018   CREATININE 1.39 (H) 09/07/2018    Other active problems: See review of systems   Lab on 11/02/2018  Component Date Value Ref Range Status  . T3, Free 11/02/2018 2.8  2.3 - 4.2 pg/mL Final  . Free T4 11/02/2018 0.93  0.60 - 1.60 ng/dL Final   Comment: Specimens from patients who are undergoing biotin therapy and /or ingesting biotin supplements may contain high levels of biotin.  The higher biotin concentration in these specimens interferes with this Free T4 assay.  Specimens that contain high levels  of biotin may cause false high results for this Free T4 assay.  Please interpret results in light of the total clinical presentation of the patient.    Marland Kitchen TSH 11/02/2018 0.98  0.35 - 4.50 uIU/mL Final  . Glucose, Bld 11/02/2018 142* 70 - 99 mg/dL Final  . Fructosamine 11/02/2018 350* 0 - 285 umol/L Final   Comment: Published reference  interval for apparently healthy subjects between age 8 and 20 is 27 - 285 umol/L and in a poorly controlled diabetic population is 228 - 563 umol/L with a mean of 396 umol/L.     Allergies as of 11/05/2018      Reactions   Ciprofloxacin Shortness Of Breath, Other (See Comments)   Dizziness   Hydrocodone Shortness Of Breath, Other (See Comments)   Tachycardia   Penicillins Hives   Pt just remembers hives Has patient had a PCN reaction causing immediate rash, facial/tongue/throat swelling, SOB or lightheadedness with hypotension: No Has patient had a PCN reaction causing severe rash involving mucus membranes or skin necrosis: No Has patient had a PCN reaction that required hospitalization No Has patient had a PCN reaction occurring within the last 10 years: No If all of the above answers are "NO", then may proceed with Cephalosporin use.      Medication List       Accurate as of November 05, 2018  2:50 PM. Always use your most recent med list.        acetaminophen-codeine 300-30 MG tablet Commonly known as:  TYLENOL #3 Take 1-2 tablets by mouth every 6 (six) hours as needed for moderate pain.   aspirin 81 MG EC tablet Take 81 mg by mouth every evening.   atorvastatin 40 MG tablet Commonly known as:  LIPITOR Take 1 tablet (40 mg total) by mouth daily.   buPROPion 150 MG 24 hr tablet Commonly known as:  WELLBUTRIN XL Take 1 tablet (150 mg total) by mouth daily.   cyclobenzaprine 10 MG tablet Commonly known as:  FLEXERIL Take 10 mg by mouth 2 (two) times daily.   ELMIRON 100 MG capsule Generic drug:  pentosan polysulfate Take 200 mg by mouth 2 (two) times daily.   estradiol 0.1 mg/24hr patch Commonly known as:  CLIMARA - Dosed in mg/24 hr Place 1 patch (0.1 mg total) onto the skin once a week.   FLUoxetine 20 MG capsule Commonly known as:  PROZAC TAKE 20mg  BY MOUTH DAILY   FREESTYLE LIBRE 14 DAY READER Devi by Does not apply route.   gabapentin 100 MG  capsule Commonly known as:  NEURONTIN Take 1 capsule (100 mg total) by mouth 3 (three) times daily.   glucose blood test strip Commonly known as:  FREESTYLE TEST STRIPS Use as instructed to check blood sugar 4 times daily.   insulin aspart 100 UNIT/ML FlexPen Commonly known as:  NOVOLOG FLEXPEN 5 to 7 units before meals   insulin glargine 100 UNIT/ML injection Commonly known as:  LANTUS Inject 5-7 Units into the skin daily.   Insulin Glargine-Lixisenatide 100-33 UNT-MCG/ML Sopn Commonly known as:  SOLIQUA Inject 26 Units into the skin daily with breakfast.   levETIRAcetam 250 MG tablet Commonly known as:  KEPPRA Take 500 mg by mouth 2 (two) times daily.   levothyroxine 125 MCG tablet Commonly known as:  SYNTHROID, LEVOTHROID 1 tablet daily and extra half tablet on Sundays   lidocaine 5 % Commonly known as:  LIDODERM Place 1 patch onto the skin daily. Remove & Discard patch within 12 hours or as directed by MD   linaclotide 72 MCG capsule Commonly known as:  LINZESS Take 1 capsule (72 mcg total) by mouth daily before breakfast.   losartan 100 MG tablet Commonly known as:  COZAAR Take 1 tablet (100 mg total) by mouth daily.   metoCLOPramide 5 MG tablet Commonly known as:  REGLAN Take 1 tablet (5 mg total) by mouth 2 (two) times daily. Take 30  Minutes before breakfast and dinner.   naltrexone 50 MG tablet Commonly known as:  DEPADE Take 1 tablet (50 mg total) by mouth daily.   ondansetron 4 MG disintegrating tablet Commonly known as:  ZOFRAN ODT Take 1 tablet (4 mg total) by mouth every 8 (eight) hours as needed.   oxyCODONE-acetaminophen 7.5-325 MG tablet Commonly known as:  PERCOCET Take 1 tablet by mouth every 4 (four) hours as needed for severe pain.   progesterone 100 MG capsule Commonly known as:  PROMETRIUM Take 1 capsule (100 mg total) by mouth at bedtime.   TOBRAMYCIN-DEXAMETHASONE OP Place 1 drop into both eyes 2 (two) times daily.   torsemide 20  MG tablet Commonly known as:  DEMADEX Take 20 mg by mouth daily.   traMADol 50 MG tablet Commonly known as:  ULTRAM Take 50 mg by mouth every 6 (six) hours as needed for moderate pain.   trimethoprim 100 MG tablet Commonly known as:  TRIMPEX Take 100 mg by mouth at bedtime.       Allergies:  Allergies  Allergen Reactions  . Ciprofloxacin Shortness Of Breath and Other (See Comments)    Dizziness   . Hydrocodone Shortness Of Breath and Other (See Comments)    Tachycardia  . Penicillins Hives    Pt just remembers hives Has patient had a PCN reaction causing immediate rash, facial/tongue/throat swelling, SOB or lightheadedness with hypotension: No Has patient had a PCN reaction causing severe rash involving mucus membranes or skin necrosis: No Has patient had a PCN reaction that required hospitalization No Has patient had a PCN reaction occurring within the last 10 years: No If all of the above answers are "NO", then may proceed with Cephalosporin use.     Past Medical History:  Diagnosis Date  . Anxiety   . Arthritis   . Blood dyscrasia    "FREE BLEEDER"  . CERVICAL RADICULOPATHY, LEFT   . Chronic back pain   . CKD (chronic kidney disease)    DR. SANFORD  [facility]  . COMMON MIGRAINE   . CORONARY ARTERY DISEASE   . Cough    CURRENT COLD  . Cystitis   . DEPRESSION   . Diabetes mellitus, type II ([place])   . DIVERTICULOSIS-COLON   . Dysrhythmia    palpitations  . Gastric ulcer 04/2008  . Gastroparesis   . GERD (gastroesophageal reflux disease)   . Hiatal hernia   .  Hyperlipidemia   . Hypertension   . Hypothyroidism   . INSOMNIA-SLEEP DISORDER-UNSPEC   . Iron deficiency anemia   . Wears glasses     Past Surgical History:  Procedure Laterality Date  . ABDOMINAL HYSTERECTOMY    . ANTERIOR LAT LUMBAR FUSION Left 08/13/2017   Procedure: LEFT SIDED LUMBAR 3-4 LATERAL INTERBODY FUSION WITH INSTRUMENTATION AND ALLOGRAFT;  Surgeon: Phylliss Bob, MD;   Location: [facility];  Service: Orthopedics;  Laterality: Left;  LEFT SIDED LUMBAR 3-4 LATERAL INTERBODY FUSION WITH INSTRUMENTATION AND ALLOGRAFT; REQUEST 3 HOURS  . Back Stimulator  07/2018  . BACK SURGERY  03/2016  . BREAST EXCISIONAL BIOPSY Right    40 years ago  . CHOLECYSTECTOMY  06/2009  . COLONOSCOPY    . ESOPHAGOGASTRODUODENOSCOPY    . EYE SURGERY Bilateral    lasik  . Gastric Wedge resection lipoma  11/2007   x2 with laparotomy and gastrostomy  . JOINT REPLACEMENT    . Left knee replacement    . Rigth knee replacement with revision  04/2008   Dr. Berenice Primas  . ROTATOR CUFF REPAIR Left 01/2009  . s/p bladder surgury  09/2009   Dr. Terance Hart  . SPINAL CORD STIMULATOR INSERTION N/A 09/11/2018   Procedure: LUMBAR SPINAL CORD STIMULATOR INSERTION;  Surgeon: Clydell Hakim, MD;  Location: [facility];  Service: Neurosurgery;  Laterality: N/A;  LUMBAR SPINAL CORD STIMULATOR INSERTION    Family History  Problem Relation Age of Onset  . Diabetes Sister        x 3  . Heart disease Sister        x2  . Breast cancer Sister 82  . Diabetes Brother        x3  . Heart disease Brother        x2  . Coronary artery disease Other        female 1st degree  . Hypertension Other   . Breast cancer Sister 28  . Breast cancer Other 25  . Diabetes Brother   . Colon cancer Neg Hx     Social History:  reports that she has quit smoking. She has never used smokeless tobacco. She reports that she does not drink alcohol or use drugs.  Review of Systems:  Hypertension: Her blood pressure is treated with only losartan 100 mg only and followed by nephrologist She says her blood pressures been running 90 diastolic at home   CKD: Her creatinine is variable, followed by nephrologist and recently better No hyperkalemia  Lab Results  Component Value Date   CREATININE 1.39 (H) 09/07/2018   CREATININE 1.45 (H) 08/28/2018   CREATININE 1.49 (H) 06/15/2018   CREATININE 1.53 (H) 04/05/2018   Lab Results  Component  Value Date   K 4.1 09/07/2018     Lipids: Followed by PCP, LDL has been controlled  Taking Lipitor 40 mg with results as follows  Lab Results  Component Value Date   CHOL 150 06/15/2018   HDL 50.00 06/15/2018   LDLCALC 85 06/15/2018   LDLDIRECT 156.6 01/07/2011   TRIG 77.0 06/15/2018   CHOLHDL 3 06/15/2018     Post ablative hypothyroidism:  Previously she had I-131 treatment on 08/20/11 for Graves' disease  Her dose has been adjusted periodically Since her TSH was markedly increased in 3/19 her dose was increased up to 125 mcg  However subsequently her TSH has been variable On her last visit she was taking 125 mcg, 6 days a week Although she was not complaining of significant  fatigue and did not have any interacting with substances with her thyroid supplement her TSH was high at 15 She is now taking 7-1/2 pills a week for day 125 dose  She is taking her supplement daily now Her TSH is back to normal      Lab Results  Component Value Date   TSH 0.98 11/02/2018   TSH 15.36 (H) 08/28/2018   TSH 0.41 06/15/2018   FREET4 0.93 11/02/2018   FREET4 0.69 08/28/2018   FREET4 0.90 06/15/2018    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     ASSESSMENT/ PLAN:   Diabetes type 2,  with obesity:   See history of present illness for detailed discussion of current management, blood sugar patterns and problems identified  A1C was 6.7 previously but appears to be lower than expected  As discussed above she is not taking mealtime insulin which she appears to be needing in addition to her [place] She has not been understanding the types of insulin and is using Lantus instead of NovoLog at mealtimes With this her blood sugars are low  overnight and still high after meals She is also taking blood sugars very infrequently especially after meals and only 44% of her diet is available for the freestyle libre for the last 2 weeks She is usually watching her diet with portions but still unable to lose weight So some of her low sugars appear to be asymptomatic and not clear how accurate her freestyle [name] is in the low range  Start with the regular insulin from [place] which will be the only affordable insulin she can have right now She will stop Lantus and explained to her the kinetics of different types of insulin and need to cover mealtime spikes in blood sugar  Also may consider adding metformin back if her renal function stays consistently near normal   Hypothyroidism: Her TSH is back to normal  She will continue her Synthroid 7-1/2 tablets a week which is the equivalent of 133 mcg average daily dose    Hypertension: Not controlled and appears to have persistently high readings recently, also continues to have mild tachycardia without any obvious reason or hyperthyroidism No recent edema   She will add metoprolol 50 mg daily  Counseling time on subjects discussed in assessment and plan sections is over 50% of today's 25 minute visit      There are no Patient Instructions on file for this visit.      Elayne Snare 11/05/2018, 2:50 PM   Note: This office note was prepared with Dragon voice recognition system technology. Any transcriptional errors that result from this process are unintentional.

## 2018-11-06 ENCOUNTER — Other Ambulatory Visit: Payer: Self-pay

## 2018-11-06 ENCOUNTER — Encounter: Payer: Self-pay | Admitting: Endocrinology

## 2018-11-06 MED ORDER — INSULIN REGULAR HUMAN 100 UNIT/ML IJ SOLN: 5.0000 [IU] | Freq: Three times a day (TID) | INTRAMUSCULAR | 11 refills | Status: DC

## 2018-11-06 NOTE — Telephone Encounter (Signed)
Correct rx sent.

## 2018-11-06 NOTE — Telephone Encounter (Signed)
This needs to be done from [facility] as indicated before with the [employer] brand.  However patient can also check how much the regular insulin is from [facility] first.  Please talk to the patient

## 2018-11-06 NOTE — Telephone Encounter (Signed)
Spoke to pt and informed her that she can stop by any [facility] and pick up their brand of insulin

## 2018-11-11 ENCOUNTER — Other Ambulatory Visit: Payer: Self-pay

## 2018-11-11 MED ORDER — INSULIN ASPART 100 UNIT/ML FLEXPEN: PEN_INJECTOR | SUBCUTANEOUS | 2 refills | Status: DC

## 2018-11-12 ENCOUNTER — Other Ambulatory Visit: Payer: Self-pay | Admitting: Internal Medicine

## 2018-11-23 DIAGNOSIS — G894 Chronic pain syndrome: Secondary | ICD-10-CM | POA: Diagnosis not present

## 2018-11-23 DIAGNOSIS — E1140 Type 2 diabetes mellitus with diabetic neuropathy, unspecified: Secondary | ICD-10-CM | POA: Diagnosis not present

## 2018-11-23 DIAGNOSIS — N189 Chronic kidney disease, unspecified: Secondary | ICD-10-CM | POA: Diagnosis not present

## 2018-11-23 DIAGNOSIS — I10 Essential (primary) hypertension: Secondary | ICD-10-CM | POA: Diagnosis not present

## 2018-11-23 DIAGNOSIS — M1990 Unspecified osteoarthritis, unspecified site: Secondary | ICD-10-CM | POA: Diagnosis not present

## 2018-11-23 DIAGNOSIS — I25118 Atherosclerotic heart disease of native coronary artery with other forms of angina pectoris: Secondary | ICD-10-CM | POA: Diagnosis not present

## 2018-11-23 DIAGNOSIS — E1122 Type 2 diabetes mellitus with diabetic chronic kidney disease: Secondary | ICD-10-CM | POA: Diagnosis not present

## 2018-11-23 DIAGNOSIS — E785 Hyperlipidemia, unspecified: Secondary | ICD-10-CM | POA: Diagnosis not present

## 2018-12-01 DIAGNOSIS — M961 Postlaminectomy syndrome, not elsewhere classified: Secondary | ICD-10-CM | POA: Diagnosis not present

## 2018-12-01 DIAGNOSIS — I10 Essential (primary) hypertension: Secondary | ICD-10-CM | POA: Diagnosis not present

## 2018-12-01 DIAGNOSIS — Z6841 Body Mass Index (BMI) 40.0 and over, adult: Secondary | ICD-10-CM | POA: Diagnosis not present

## 2018-12-01 DIAGNOSIS — Z9689 Presence of other specified functional implants: Secondary | ICD-10-CM | POA: Diagnosis not present

## 2018-12-01 DIAGNOSIS — M5127 Other intervertebral disc displacement, lumbosacral region: Secondary | ICD-10-CM | POA: Diagnosis not present

## 2018-12-02 DIAGNOSIS — E113312 Type 2 diabetes mellitus with moderate nonproliferative diabetic retinopathy with macular edema, left eye: Secondary | ICD-10-CM | POA: Diagnosis not present

## 2018-12-07 ENCOUNTER — Ambulatory Visit (INDEPENDENT_AMBULATORY_CARE_PROVIDER_SITE_OTHER): Payer: Medicare Other | Admitting: Obstetrics & Gynecology

## 2018-12-07 ENCOUNTER — Encounter: Payer: Self-pay | Admitting: Obstetrics & Gynecology

## 2018-12-07 VITALS — BP 126/86 | Ht 66.0 in | Wt 291.0 lb

## 2018-12-07 DIAGNOSIS — Z90710 Acquired absence of both cervix and uterus: Secondary | ICD-10-CM

## 2018-12-07 DIAGNOSIS — Z6841 Body Mass Index (BMI) 40.0 and over, adult: Secondary | ICD-10-CM | POA: Diagnosis not present

## 2018-12-07 DIAGNOSIS — Z1272 Encounter for screening for malignant neoplasm of vagina: Secondary | ICD-10-CM | POA: Diagnosis not present

## 2018-12-07 DIAGNOSIS — Z780 Asymptomatic menopausal state: Secondary | ICD-10-CM | POA: Diagnosis not present

## 2018-12-07 DIAGNOSIS — Z01419 Encounter for gynecological examination (general) (routine) without abnormal findings: Secondary | ICD-10-CM | POA: Diagnosis not present

## 2018-12-07 NOTE — Progress Notes (Signed)
[name] 1951/01/07 [id number]   History:    68 y.o. G3P3L3  RP:  Established patient presenting for annual gyn exam   HPI: Status post total hysterectomy.  Postmenopausal, well on no hormone replacement therapy.  No pelvic pain.  Abstinent.  Urine and bowel movements normal.  Breasts normal.  Body mass index 46.97.  Fasting health labs with family physician.  Past medical history,surgical history, family history and social history were all reviewed and documented in the EPIC chart.  Gynecologic History No LMP recorded. Patient has had a hysterectomy. Contraception: status post hysterectomy Last Pap: 2013. Results were: normal Last mammogram: 04/2018. Results were: Benign Bone Density: 08/2015 Normal Colonoscopy: 2010  Obstetric History OB History  Gravida Para Term Preterm AB Living  3 3       3   SAB TAB Ectopic Multiple Live Births               # Outcome Date GA Lbr Len/2nd Weight Sex Delivery Anes PTL Lv  3 Para           2 Para           1 Para              ROS: A ROS was performed and pertinent positives and negatives are included in the history.  GENERAL: No fevers or chills. HEENT: No change in vision, no earache, sore throat or sinus congestion. NECK: No pain or stiffness. CARDIOVASCULAR: No chest pain or pressure. No palpitations. PULMONARY: No shortness of breath, cough or wheeze. GASTROINTESTINAL: No abdominal pain, nausea, vomiting or diarrhea, melena or bright red blood per rectum. GENITOURINARY: No urinary frequency, urgency, hesitancy or dysuria. MUSCULOSKELETAL: No joint or muscle pain, no back pain, no recent trauma. DERMATOLOGIC: No rash, no itching, no lesions. ENDOCRINE: No polyuria, polydipsia, no heat or cold intolerance. No recent change in weight. HEMATOLOGICAL: No anemia or easy bruising or bleeding. NEUROLOGIC: No headache, seizures, numbness, tingling or weakness. PSYCHIATRIC: No depression, no loss of interest in normal activity or change in  sleep pattern.     Exam:   BP 126/86   Ht 5\' 6"  (1.676 m)   Wt 291 lb (132 kg)   BMI 46.97 kg/m   Body mass index is 46.97 kg/m.  General appearance : Well developed well nourished female. No acute distress HEENT: Eyes: no retinal hemorrhage or exudates,  Neck supple, trachea midline, no carotid bruits, no thyroidmegaly Lungs: Clear to auscultation, no rhonchi or wheezes, or rib retractions  Heart: Regular rate and rhythm, no murmurs or gallops Breast:Examined in sitting and supine position were symmetrical in appearance, no palpable masses or tenderness,  no skin retraction, no nipple inversion, no nipple discharge, no skin discoloration, no axillary or supraclavicular lymphadenopathy Abdomen: no palpable masses or tenderness, no rebound or guarding Extremities: no edema or skin discoloration or tenderness  Pelvic: Vulva: Normal             Vagina: No gross lesions or discharge.  Pap reflex done.  Cervix/Uterus absent  Adnexa  Without masses or tenderness  Anus: Normal   Assessment/Plan:  68 y.o. female for annual exam   1. Encounter for Papanicolaou smear of vagina as part of routine gynecological examination Gynecologic exam status post total hysterectomy and menopause.  Pap reflex done.  Breast exam normal.  Last screening mammogram July 2019 was benign.  Colonoscopy in 2010, will schedule repeat colonoscopy.  Health labs with family physician.  2. S/P total  hysterectomy  3. Postmenopausal Well on no hormone replacement therapy.  Last bone density in November 2016 was normal.  We will repeat a bone density at 5 years.  Vitamin D supplements, calcium intake of 1.5 g/day and regular weightbearing physical activity is recommended.  4. Class 3 severe obesity due to excess calories without serious comorbidity with body mass index (BMI) of 45.0 to 49.9 in adult [facility]) Refer to [place].  Recommend a lower calorie/carb diet such as [employer].  Aerobic physical  activities 5 times a week and weightlifting every 2 days.  [name] MD, 2:59 PM 12/07/2018

## 2018-12-08 ENCOUNTER — Telehealth: Payer: Self-pay | Admitting: *Deleted

## 2018-12-08 DIAGNOSIS — Z6841 Body Mass Index (BMI) 40.0 and over, adult: Principal | ICD-10-CM

## 2018-12-08 LAB — PAP IG W/ RFLX HPV ASCU

## 2018-12-08 NOTE — Telephone Encounter (Signed)
-----   Message from [name], MD sent at 12/07/2018  3:14 PM EST ----- Regarding: Refer to [place] Obesity BMI 46+

## 2018-12-08 NOTE — Telephone Encounter (Signed)
Referral placed in epic at [place] [facility] they will call to schedule.

## 2018-12-14 ENCOUNTER — Encounter: Payer: Self-pay | Admitting: Obstetrics & Gynecology

## 2018-12-14 NOTE — Patient Instructions (Addendum)
1. Encounter for Papanicolaou smear of vagina as part of routine gynecological examination Gynecologic exam status post total hysterectomy and menopause.  Pap reflex done.  Breast exam normal.  Last screening mammogram July 2019 was benign.  Colonoscopy in 2010, will schedule repeat colonoscopy.  Health labs with family physician.  2. S/P total hysterectomy  3. Postmenopausal Well on no hormone replacement therapy.  Last bone density in November 2016 was normal.  We will repeat a bone density at 5 years.  Vitamin D supplements, calcium intake of 1.5 g/day and regular weightbearing physical activity is recommended.  4. Class 3 severe obesity due to excess calories without serious comorbidity with body mass index (BMI) of 45.0 to 49.9 in adult [facility]) Refer to [place].  Recommend a lower calorie/carb diet such as [employer].  Aerobic physical activities 5 times a week and weightlifting every 2 days.  [name], it was a pleasure seeing you today!  I will inform you of your results as soon as they are available.

## 2018-12-15 ENCOUNTER — Telehealth: Payer: Self-pay | Admitting: Endocrinology

## 2018-12-15 ENCOUNTER — Other Ambulatory Visit: Payer: Self-pay | Admitting: Endocrinology

## 2018-12-15 NOTE — Telephone Encounter (Signed)
Rx sent 

## 2018-12-15 NOTE — Telephone Encounter (Signed)
MEDICATION: Clonidine  PHARMACY:  [facility] at [facility] A 90 DAY SUPPLY : yes  IS PATIENT OUT OF MEDICATION: yes  IF NOT; HOW MUCH IS LEFT: no  LAST APPOINTMENT DATE: @3 /07/2019  NEXT APPOINTMENT DATE:@3 /30/2020  DO WE HAVE YOUR PERMISSION TO LEAVE A DETAILED MESSAGE:Yes  OTHER COMMENTS: States this is urgent   **Let patient know to contact pharmacy at the end of the day to make sure medication is ready. **  ** Please notify patient to allow 48-72 hours to process**  **Encourage patient to contact the pharmacy for refills or they can request refills through [facility]**

## 2018-12-17 NOTE — Telephone Encounter (Signed)
Patient told [facility] she wants to check insurance coverage

## 2019-01-04 ENCOUNTER — Other Ambulatory Visit: Payer: Medicare Other

## 2019-01-07 ENCOUNTER — Other Ambulatory Visit: Payer: Self-pay | Admitting: Internal Medicine

## 2019-01-08 ENCOUNTER — Ambulatory Visit: Payer: Medicare Other | Admitting: Endocrinology

## 2019-01-12 DIAGNOSIS — M961 Postlaminectomy syndrome, not elsewhere classified: Secondary | ICD-10-CM | POA: Diagnosis not present

## 2019-01-13 DIAGNOSIS — E113312 Type 2 diabetes mellitus with moderate nonproliferative diabetic retinopathy with macular edema, left eye: Secondary | ICD-10-CM | POA: Diagnosis not present

## 2019-01-18 ENCOUNTER — Telehealth: Payer: Self-pay | Admitting: Internal Medicine

## 2019-01-18 NOTE — Telephone Encounter (Signed)
Copied from [place] [id number]. Topic: Quick Communication - Rx Refill/Question >> Jan 18, 2019  2:05 PM [name] wrote: Medication: freestyle libre sensors - RX will expire in May - f/u on request faxed 01/14/2019 - provided correct fax# and she will resend written order form that is needed - please advise on receipt and refills Has the patient contacted their pharmacy?  Yes - [facility] (with phone number or street name):  [facility] - ph# [phone] - fax# [phone] (same #)

## 2019-01-18 NOTE — Telephone Encounter (Signed)
Noted. Will be looking out for the form.

## 2019-01-19 NOTE — Telephone Encounter (Signed)
Form has been received, fill out and waiting for PCP review.   Dr. Jenny Reichmann I have placed the form on your desk.

## 2019-01-20 ENCOUNTER — Telehealth: Payer: Self-pay

## 2019-01-20 NOTE — Telephone Encounter (Signed)
Pt does not have device with video capabilities for video visit. She would like to proceed with a phone visit for 4/16 @ 3pm. At the moment she was not aware of any refills she would need at this time.

## 2019-01-20 NOTE — Telephone Encounter (Signed)
Ok with me 

## 2019-01-21 ENCOUNTER — Telehealth (INDEPENDENT_AMBULATORY_CARE_PROVIDER_SITE_OTHER): Payer: Medicare Other | Admitting: Internal Medicine

## 2019-01-21 ENCOUNTER — Ambulatory Visit: Payer: Medicare Other | Admitting: Internal Medicine

## 2019-01-21 DIAGNOSIS — K921 Melena: Secondary | ICD-10-CM

## 2019-01-21 DIAGNOSIS — E611 Iron deficiency: Secondary | ICD-10-CM

## 2019-01-21 DIAGNOSIS — R1010 Upper abdominal pain, unspecified: Secondary | ICD-10-CM | POA: Diagnosis not present

## 2019-01-21 DIAGNOSIS — R1012 Left upper quadrant pain: Secondary | ICD-10-CM | POA: Insufficient documentation

## 2019-01-21 MED ORDER — LOSARTAN POTASSIUM 100 MG PO TABS: ORAL_TABLET | ORAL | 3 refills | Status: DC

## 2019-01-21 MED ORDER — PANTOPRAZOLE SODIUM 40 MG PO TBEC: 40.0000 mg | DELAYED_RELEASE_TABLET | Freq: Every day | ORAL | 3 refills | Status: DC

## 2019-01-21 MED ORDER — METOPROLOL SUCCINATE ER 50 MG PO TB24: 50.0000 mg | ORAL_TABLET | Freq: Every day | ORAL | 3 refills | Status: DC

## 2019-01-21 MED ORDER — FLUOXETINE HCL 20 MG PO CAPS: ORAL_CAPSULE | ORAL | 3 refills | Status: DC

## 2019-01-21 MED ORDER — BUPROPION HCL ER (XL) 150 MG PO TB24: 150.0000 mg | ORAL_TABLET | Freq: Every day | ORAL | 3 refills | Status: DC

## 2019-01-21 MED ORDER — GABAPENTIN 100 MG PO CAPS: 100.0000 mg | ORAL_CAPSULE | Freq: Three times a day (TID) | ORAL | 5 refills | Status: DC

## 2019-01-21 MED ORDER — LEVOTHYROXINE SODIUM 125 MCG PO TABS: ORAL_TABLET | ORAL | 3 refills | Status: DC

## 2019-01-21 MED ORDER — ATORVASTATIN CALCIUM 40 MG PO TABS: 40.0000 mg | ORAL_TABLET | Freq: Every day | ORAL | 3 refills | Status: DC

## 2019-01-21 NOTE — Telephone Encounter (Signed)
Cumulative time during 7-day interval 13 min,, there was not an associated office visit for this concern within a 7 day period.  Verbal consent for services obtained from patient prior to services given.  Names of all persons present for services: Cathlean Cower, MD, patient  Chief complaint: upper abd discomfort and recent hematochezia  History, 68 yo F with hx of GERD, PUD s/p gastric surgury with last EGD 2013, tx with PPI at the time but not currently, now with c/o 4 days onset upper mid abd dull discomfort, without fever, n/v, abd pain or diarrhea, but has also had several episodes small volume BRBPR with BMs on tissue.  Last colonoscopy 2008 with diverticulosis and hemorrhoid noted.  Followed by Dr Fuller Plan and Tye Savoy NP in past.  Denies worsening dysphagia or recent wt loss.  Pt denies chest pain, increased sob or doe, wheezing, orthopnea, PND, increased LE swelling, palpitations, dizziness or syncope.  Pt denies new neurological symptoms such as new headache, or facial or extremity weakness or numbness   Pt denies polydipsia, polyuria,   Past Medical History:  Diagnosis Date  . Anxiety   . Arthritis   . Blood dyscrasia    "FREE BLEEDER"  . CERVICAL RADICULOPATHY, LEFT   . Chronic back pain   . CKD (chronic kidney disease)    DR. SANFORD  [facility]  . COMMON MIGRAINE   . CORONARY ARTERY DISEASE   . Cough    CURRENT COLD  . Cystitis   . DEPRESSION   . Diabetes mellitus, type II ([place])   . DIVERTICULOSIS-COLON   . Dysrhythmia    palpitations  . Gastric ulcer 04/2008  . Gastroparesis   . GERD (gastroesophageal reflux disease)   . Hiatal hernia   . Hyperlipidemia   . Hypertension   . Hypothyroidism   . INSOMNIA-SLEEP DISORDER-UNSPEC   . Iron deficiency anemia   . Wears glasses    Past Surgical History:  Procedure Laterality Date  . ABDOMINAL HYSTERECTOMY    . ANTERIOR LAT LUMBAR FUSION Left 08/13/2017   Procedure: LEFT SIDED LUMBAR 3-4 LATERAL INTERBODY FUSION WITH  INSTRUMENTATION AND ALLOGRAFT;  Surgeon: Phylliss Bob, MD;  Location: [facility];  Service: Orthopedics;  Laterality: Left;  LEFT SIDED LUMBAR 3-4 LATERAL INTERBODY FUSION WITH INSTRUMENTATION AND ALLOGRAFT; REQUEST 3 HOURS  . Back Stimulator  07/2018  . BACK SURGERY  03/2016  . BREAST EXCISIONAL BIOPSY Right    40 years ago  . CHOLECYSTECTOMY  06/2009  . COLONOSCOPY    . ESOPHAGOGASTRODUODENOSCOPY    . EYE SURGERY Bilateral    lasik  . Gastric Wedge resection lipoma  11/2007   x2 with laparotomy and gastrostomy  . JOINT REPLACEMENT    . Left knee replacement    . Rigth knee replacement with revision  04/2008   Dr. Berenice Primas  . ROTATOR CUFF REPAIR Left 01/2009  . s/p bladder surgury  09/2009   Dr. Terance Hart  . SPINAL CORD STIMULATOR INSERTION N/A 09/11/2018   Procedure: LUMBAR SPINAL CORD STIMULATOR INSERTION;  Surgeon: Clydell Hakim, MD;  Location: [place] [facility];  Service: Neurosurgery;  Laterality: N/A;  LUMBAR SPINAL CORD STIMULATOR INSERTION    reports that she has quit smoking. She has never used smokeless tobacco. She reports that she does not drink alcohol or use drugs. family history includes Breast cancer (age of onset: 53) in an other family member; Breast cancer (age of onset: 41) in her sister; Breast cancer (age of onset: 60) in her sister; Coronary artery  disease in an other family member; Diabetes in her brother, brother, and sister; Heart disease in her brother and sister; Hypertension in an other family member. Allergies  Allergen Reactions  . Ciprofloxacin Shortness Of Breath and Other (See Comments)    Dizziness   . Hydrocodone Shortness Of Breath and Other (See Comments)    Tachycardia  . Penicillins Hives    Pt just remembers hives Has patient had a PCN reaction causing immediate rash, facial/tongue/throat swelling, SOB or lightheadedness with hypotension: No Has patient had a PCN reaction causing severe rash involving mucus membranes or skin necrosis: No Has patient had a PCN  reaction that required hospitalization No Has patient had a PCN reaction occurring within the last 10 years: No If all of the above answers are "NO", then may proceed with Cephalosporin use.    Current Outpatient Medications on File Prior to Visit  Medication Sig Dispense Refill  . aspirin 81 MG EC tablet Take 81 mg by mouth every evening.     Marland Kitchen atorvastatin (LIPITOR) 40 MG tablet Take 1 tablet (40 mg total) by mouth daily. (Patient taking differently: Take 40 mg by mouth at bedtime. ) 30 tablet 3  . buPROPion (WELLBUTRIN XL) 150 MG 24 hr tablet Take 1 tablet (150 mg total) by mouth daily. 90 tablet 3  . cloNIDine (CATAPRES) 0.1 MG tablet TAKE ONE TABLET BY MOUTH TWICE A DAY 60 tablet 2  . Continuous Blood Gluc Receiver (FREESTYLE LIBRE 14 DAY READER) DEVI by Does not apply route.    . cyclobenzaprine (FLEXERIL) 10 MG tablet Take 10 mg by mouth 2 (two) times daily.    Marland Kitchen ELMIRON 100 MG capsule Take 200 mg by mouth 2 (two) times daily.     Marland Kitchen estradiol (CLIMARA - DOSED IN MG/24 HR) 0.1 mg/24hr patch Place 1 patch (0.1 mg total) onto the skin once a week. (Patient not taking: Reported on 11/05/2018) 12 patch 4  . FLUoxetine (PROZAC) 20 MG capsule TAKE 1 CAPSULE BY MOUTH DAILY 90 capsule 1  . gabapentin (NEURONTIN) 100 MG capsule Take 1 capsule (100 mg total) by mouth 3 (three) times daily. 90 capsule 3  . glucose blood (FREESTYLE TEST STRIPS) test strip Use as instructed to check blood sugar 4 times daily. 100 each 3  . insulin aspart (NOVOLOG FLEXPEN) 100 UNIT/ML FlexPen 5 to 7 units before meals 15 pen 2  . Insulin Glargine-Lixisenatide (SOLIQUA) 100-33 UNT-MCG/ML SOPN Inject 26 Units into the skin daily with breakfast. 3 pen 3  . levETIRAcetam (KEPPRA) 250 MG tablet Take 500 mg by mouth 2 (two) times daily.     Marland Kitchen levothyroxine (SYNTHROID, LEVOTHROID) 125 MCG tablet 1 tablet daily and extra half tablet on Sundays 96 tablet 3  . losartan (COZAAR) 100 MG tablet TAKE 1 TABLET(100 MG) BY MOUTH DAILY  90 tablet 0  . metoprolol succinate (TOPROL-XL) 50 MG 24 hr tablet Take 1 tablet (50 mg total) by mouth daily. Take with or immediately following a meal. 30 tablet 3  . naltrexone (DEPADE) 50 MG tablet Take 1 tablet (50 mg total) by mouth daily. 90 tablet 3  . ondansetron (ZOFRAN ODT) 4 MG disintegrating tablet Take 1 tablet (4 mg total) by mouth every 8 (eight) hours as needed. 10 tablet 0  . progesterone (PROMETRIUM) 100 MG capsule Take 1 capsule (100 mg total) by mouth at bedtime. 90 capsule 4  . TOBRAMYCIN-DEXAMETHASONE OP Place 1 drop into both eyes 2 (two) times daily.    Marland Kitchen torsemide (  DEMADEX) 20 MG tablet Take 20 mg by mouth daily.    Marland Kitchen trimethoprim (TRIMPEX) 100 MG tablet Take 100 mg by mouth at bedtime.     No current facility-administered medications on file prior to visit.    Lab Results  Component Value Date   WBC 7.4 09/07/2018   HGB 11.0 (L) 09/07/2018   HCT 36.5 09/07/2018   PLT 222 09/07/2018   GLUCOSE 142 (H) 11/02/2018   CHOL 150 06/15/2018   TRIG 77.0 06/15/2018   HDL 50.00 06/15/2018   LDLDIRECT 156.6 01/07/2011   LDLCALC 85 06/15/2018   ALT 14 09/07/2018   AST 20 09/07/2018   NA 139 09/07/2018   K 4.1 09/07/2018   CL 105 09/07/2018   CREATININE 1.39 (H) 09/07/2018   BUN 16 09/07/2018   CO2 24 09/07/2018   TSH 0.98 11/02/2018   INR 1.21 08/07/2017   HGBA1C 6.7 (H) 08/28/2018   MICROALBUR 2.0 (H) 12/31/2017    A/P/  Abd pain - etiology unclear, for labs as ordered, and restart PPI - protonix 40 qd, refer GI as may need EGD; to ED for any worsening s/s  Hematochezia - ok for cbc with labs, refer GI as may need colonoscopy  Cathlean Cower MD

## 2019-01-25 ENCOUNTER — Ambulatory Visit (INDEPENDENT_AMBULATORY_CARE_PROVIDER_SITE_OTHER): Payer: Medicare Other | Admitting: Gastroenterology

## 2019-01-25 ENCOUNTER — Other Ambulatory Visit: Payer: Self-pay

## 2019-01-25 ENCOUNTER — Encounter: Payer: Self-pay | Admitting: Gastroenterology

## 2019-01-25 VITALS — Ht 65.0 in | Wt 250.0 lb

## 2019-01-25 DIAGNOSIS — K3184 Gastroparesis: Secondary | ICD-10-CM

## 2019-01-25 DIAGNOSIS — K5904 Chronic idiopathic constipation: Secondary | ICD-10-CM | POA: Diagnosis not present

## 2019-01-25 DIAGNOSIS — K219 Gastro-esophageal reflux disease without esophagitis: Secondary | ICD-10-CM | POA: Diagnosis not present

## 2019-01-25 DIAGNOSIS — K921 Melena: Secondary | ICD-10-CM | POA: Diagnosis not present

## 2019-01-25 MED ORDER — LINACLOTIDE 72 MCG PO CAPS: 72.0000 ug | ORAL_CAPSULE | Freq: Every day | ORAL | 11 refills | Status: DC

## 2019-01-25 MED ORDER — PANTOPRAZOLE SODIUM 40 MG PO TBEC: 40.0000 mg | DELAYED_RELEASE_TABLET | Freq: Every day | ORAL | 3 refills | Status: DC

## 2019-01-25 NOTE — Progress Notes (Signed)
History of Present Illness: This is a 68 year old female with chronic constipation.  She states she ran out of Linzess about 2 months ago and has had worsening problems with constipation.  She has had to use an enema on several occasions including a few days ago..  She noted left lower quadrant pain for 2 to 3 days that would come and go and appeared to be relieved with a bowel movement.  She had small amount of bright red blood with a bowel movement when straining a few days ago but has not noted any other episodes of rectal bleeding in months.  Her reflux is well controlled.  No other gastrointestinal complaints.    Allergies  Allergen Reactions  . Ciprofloxacin Shortness Of Breath and Other (See Comments)    Dizziness   . Hydrocodone Shortness Of Breath and Other (See Comments)    Tachycardia  . Penicillins Hives    Pt just remembers hives Has patient had a PCN reaction causing immediate rash, facial/tongue/throat swelling, SOB or lightheadedness with hypotension: No Has patient had a PCN reaction causing severe rash involving mucus membranes or skin necrosis: No Has patient had a PCN reaction that required hospitalization No Has patient had a PCN reaction occurring within the last 10 years: No If all of the above answers are "NO", then may proceed with Cephalosporin use.    Outpatient Medications Prior to Visit  Medication Sig Dispense Refill  . aspirin 81 MG EC tablet Take 81 mg by mouth every evening.     Marland Kitchen atorvastatin (LIPITOR) 40 MG tablet Take 1 tablet (40 mg total) by mouth at bedtime. 90 tablet 3  . buPROPion (WELLBUTRIN XL) 150 MG 24 hr tablet Take 1 tablet (150 mg total) by mouth daily. 90 tablet 3  . cloNIDine (CATAPRES) 0.1 MG tablet TAKE ONE TABLET BY MOUTH TWICE A DAY 60 tablet 2  . Continuous Blood Gluc Receiver (FREESTYLE LIBRE 14 DAY READER) DEVI by Does not apply route.    Marland Kitchen ELMIRON 100 MG capsule Take 200 mg by mouth 2 (two) times daily.     Marland Kitchen estradiol  (CLIMARA - DOSED IN MG/24 HR) 0.1 mg/24hr patch Place 1 patch (0.1 mg total) onto the skin once a week. 12 patch 4  . FLUoxetine (PROZAC) 20 MG capsule TAKE 1 CAPSULE BY MOUTH DAILY 90 capsule 3  . gabapentin (NEURONTIN) 100 MG capsule Take 1 capsule (100 mg total) by mouth 3 (three) times daily. 90 capsule 5  . glucose blood (FREESTYLE TEST STRIPS) test strip Use as instructed to check blood sugar 4 times daily. 100 each 3  . insulin aspart (NOVOLOG FLEXPEN) 100 UNIT/ML FlexPen 5 to 7 units before meals 15 pen 2  . Insulin Glargine-Lixisenatide (SOLIQUA) 100-33 UNT-MCG/ML SOPN Inject 26 Units into the skin daily with breakfast. 3 pen 3  . levETIRAcetam (KEPPRA) 250 MG tablet Take 500 mg by mouth 2 (two) times daily.     Marland Kitchen levothyroxine (SYNTHROID) 125 MCG tablet 1 tablet daily and extra half tablet on Sundays 96 tablet 3  . losartan (COZAAR) 100 MG tablet TAKE 1 TABLET(100 MG) BY MOUTH DAILY 90 tablet 3  . metoprolol succinate (TOPROL-XL) 50 MG 24 hr tablet Take 1 tablet (50 mg total) by mouth daily. Take with or immediately following a meal. 90 tablet 3  . ondansetron (ZOFRAN ODT) 4 MG disintegrating tablet Take 1 tablet (4 mg total) by mouth every 8 (eight) hours as needed. 10 tablet 0  . pantoprazole (  PROTONIX) 40 MG tablet Take 1 tablet (40 mg total) by mouth daily. 90 tablet 3  . torsemide (DEMADEX) 20 MG tablet Take 20 mg by mouth daily.    Marland Kitchen trimethoprim (TRIMPEX) 100 MG tablet Take 100 mg by mouth at bedtime.    . cyclobenzaprine (FLEXERIL) 10 MG tablet Take 10 mg by mouth 2 (two) times daily.    . progesterone (PROMETRIUM) 100 MG capsule Take 1 capsule (100 mg total) by mouth at bedtime. 90 capsule 4  . TOBRAMYCIN-DEXAMETHASONE OP Place 1 drop into both eyes 2 (two) times daily.     No facility-administered medications prior to visit.    Past Medical History:  Diagnosis Date  . Anxiety   . Arthritis   . Blood dyscrasia    "FREE BLEEDER"  . CERVICAL RADICULOPATHY, LEFT   . Chronic  back pain   . CKD (chronic kidney disease)    DR. SANFORD  [facility]  . COMMON MIGRAINE   . CORONARY ARTERY DISEASE   . Cough    CURRENT COLD  . Cystitis   . DEPRESSION   . Diabetes mellitus, type II ([place])   . DIVERTICULOSIS-COLON   . Dysrhythmia    palpitations  . Gastric ulcer 04/2008  . Gastroparesis   . GERD (gastroesophageal reflux disease)   . Hiatal hernia   . Hyperlipidemia   . Hypertension   . Hypothyroidism   . INSOMNIA-SLEEP DISORDER-UNSPEC   . Iron deficiency anemia   . Wears glasses    Past Surgical History:  Procedure Laterality Date  . ABDOMINAL HYSTERECTOMY    . ANTERIOR LAT LUMBAR FUSION Left 08/13/2017   Procedure: LEFT SIDED LUMBAR 3-4 LATERAL INTERBODY FUSION WITH INSTRUMENTATION AND ALLOGRAFT;  Surgeon: Phylliss Bob, MD;  Location: [facility];  Service: Orthopedics;  Laterality: Left;  LEFT SIDED LUMBAR 3-4 LATERAL INTERBODY FUSION WITH INSTRUMENTATION AND ALLOGRAFT; REQUEST 3 HOURS  . Back Stimulator  07/2018  . BACK SURGERY  03/2016  . BREAST EXCISIONAL BIOPSY Right    40 years ago  . CHOLECYSTECTOMY  06/2009  . COLONOSCOPY    . ESOPHAGOGASTRODUODENOSCOPY    . EYE SURGERY Bilateral    lasik  . Gastric Wedge resection lipoma  11/2007   x2 with laparotomy and gastrostomy  . JOINT REPLACEMENT    . Left knee replacement    . Rigth knee replacement with revision  04/2008   Dr. Berenice Primas  . ROTATOR CUFF REPAIR Left 01/2009  . s/p bladder surgury  09/2009   Dr. Terance Hart  . SPINAL CORD STIMULATOR INSERTION N/A 09/11/2018   Procedure: LUMBAR SPINAL CORD STIMULATOR INSERTION;  Surgeon: Clydell Hakim, MD;  Location: [facility];  Service: Neurosurgery;  Laterality: N/A;  LUMBAR SPINAL CORD STIMULATOR INSERTION   Social History   Socioeconomic History  . Marital status: Married    Spouse name: [name]  . Number of children: 3  . Years of education: 65  . Highest education level: Not on file  Occupational History  . Occupation: disabled  Social Needs  .  Financial resource strain: Not on file  . Food insecurity:    Worry: Not on file    Inability: Not on file  . Transportation needs:    Medical: Not on file    Non-medical: Not on file  Tobacco Use  . Smoking status: Former Research scientist (life sciences)  . Smokeless tobacco: Never Used  . Tobacco comment: quit 30 years ago  Substance and Sexual Activity  . Alcohol use: No    Alcohol/week: 0.0  standard drinks  . Drug use: No  . Sexual activity: Not Currently    Birth control/protection: None    Comment: 1st intercourse- 23, partners- 2, married- 70 yrs   Lifestyle  . Physical activity:    Days per week: Not on file    Minutes per session: Not on file  . Stress: Not on file  Relationships  . Social connections:    Talks on phone: Not on file    Gets together: Not on file    Attends religious service: Not on file    Active member of club or organization: Not on file    Attends meetings of clubs or organizations: Not on file    Relationship status: Not on file  Other Topics Concern  . Not on file  Social History Narrative   Patient gets regular exercise   Lives with husband   Caffeine- tea, 2 glasses a day, sodas 2-3 daily   Family History  Problem Relation Age of Onset  . Diabetes Sister        x 3  . Heart disease Sister        x2  . Breast cancer Sister 51  . Diabetes Brother        x3  . Heart disease Brother        x2  . Coronary artery disease Other        female 1st degree  . Hypertension Other   . Breast cancer Sister 76  . Breast cancer Other 25  . Diabetes Brother   . Colon cancer Neg Hx        Physical Exam: Telemedicine visit - not performed   Assessment and Recommendations:  1.  Chronic constipation.  Refill Linzess 72 mcg daily for 1 year.  Maintain adequate daily hydration and fiber to a level tolerated.  Call if symptoms not adequately controlled within the next few weeks.  Try to avoid straining and using enemas.  REV in 6 weeks.   2.  Intermittent small-volume  hematochezia.  Suspected hemorrhoids however colorectal neoplasms need to be excluded.  I recommended colonoscopy for further evaluation.  She did not proceed with recommended colonoscopy in 2018.  I asked her to consider colonoscopy to be sure we have fully excluded colorectal neoplasms and other significant colorectal disease that could be contributing to her symptoms and we would discuss it further at her return visit. REV in 6 weeks.   3. GERD and gastroparesis.  Follow gastroparesis diet.  Follow antireflux measures.  Refill Protonix 40 mg daily for 1 year.    These services were provided via telemedicine, audio only per patient request.  The patient was at home and the provider was in the office, alone.  We discussed the limitations of evaluation and management by telemedicine and the availability of in person appointments.  Patient consented for this telemedicine visit and is aware of possible charges for this service.  The other person participating in the telemedicine service was Marlon Pel, [place] who reviewed medications, allergies, past history and completed AVS.  Time spent on call: 10 minutes

## 2019-01-25 NOTE — Patient Instructions (Addendum)
We have sent the following medications to your pharmacy for you to pick up at your convenience: Linzess and Protonix.  Increase your water and fiber intake.   Call our office back in a few weeks if your symptoms are not better.   Avoid straining and using enemas.   Patient advised to avoid spicy, acidic, citrus, chocolate, mints, fruit and fruit juices.  Limit the intake of caffeine, alcohol and Soda.  Don't exercise too soon after eating.  Don't lie down within 3-4 hours of eating.  Elevate the head of your bed.  Follow up with Dr. Fuller Plan in 6 weeks. Our schedule is not out that far so please call our office in a few weeks to schedule.

## 2019-02-02 DIAGNOSIS — R109 Unspecified abdominal pain: Secondary | ICD-10-CM | POA: Diagnosis not present

## 2019-02-02 DIAGNOSIS — N3946 Mixed incontinence: Secondary | ICD-10-CM | POA: Diagnosis not present

## 2019-02-02 DIAGNOSIS — N3020 Other chronic cystitis without hematuria: Secondary | ICD-10-CM | POA: Diagnosis not present

## 2019-02-03 DIAGNOSIS — N3020 Other chronic cystitis without hematuria: Secondary | ICD-10-CM | POA: Diagnosis not present

## 2019-02-08 ENCOUNTER — Encounter: Payer: Self-pay | Admitting: Endocrinology

## 2019-02-08 ENCOUNTER — Encounter: Payer: Self-pay | Admitting: Internal Medicine

## 2019-02-08 ENCOUNTER — Ambulatory Visit (INDEPENDENT_AMBULATORY_CARE_PROVIDER_SITE_OTHER): Payer: Medicare Other | Admitting: Internal Medicine

## 2019-02-08 ENCOUNTER — Other Ambulatory Visit (INDEPENDENT_AMBULATORY_CARE_PROVIDER_SITE_OTHER): Payer: Medicare Other

## 2019-02-08 ENCOUNTER — Ambulatory Visit (INDEPENDENT_AMBULATORY_CARE_PROVIDER_SITE_OTHER)
Admission: RE | Admit: 2019-02-08 | Discharge: 2019-02-08 | Disposition: A | Discharge status: Home / Self Care | Payer: Medicare Other | Source: Ambulatory Visit | Attending: Internal Medicine | Admitting: Internal Medicine

## 2019-02-08 ENCOUNTER — Other Ambulatory Visit: Payer: Self-pay

## 2019-02-08 ENCOUNTER — Ambulatory Visit (INDEPENDENT_AMBULATORY_CARE_PROVIDER_SITE_OTHER): Payer: Medicare Other | Admitting: Endocrinology

## 2019-02-08 VITALS — BP 160/80 | HR 72 | Ht 65.0 in | Wt 278.4 lb

## 2019-02-08 VITALS — BP 162/82 | HR 83 | Temp 99.2°F | Ht 65.0 in | Wt 278.0 lb

## 2019-02-08 DIAGNOSIS — R112 Nausea with vomiting, unspecified: Secondary | ICD-10-CM

## 2019-02-08 DIAGNOSIS — E119 Type 2 diabetes mellitus without complications: Secondary | ICD-10-CM

## 2019-02-08 DIAGNOSIS — I10 Essential (primary) hypertension: Secondary | ICD-10-CM

## 2019-02-08 DIAGNOSIS — E611 Iron deficiency: Secondary | ICD-10-CM

## 2019-02-08 DIAGNOSIS — E890 Postprocedural hypothyroidism: Secondary | ICD-10-CM | POA: Diagnosis not present

## 2019-02-08 DIAGNOSIS — N390 Urinary tract infection, site not specified: Secondary | ICD-10-CM | POA: Diagnosis not present

## 2019-02-08 DIAGNOSIS — E1165 Type 2 diabetes mellitus with hyperglycemia: Secondary | ICD-10-CM | POA: Diagnosis not present

## 2019-02-08 DIAGNOSIS — K5900 Constipation, unspecified: Secondary | ICD-10-CM | POA: Diagnosis not present

## 2019-02-08 DIAGNOSIS — N184 Chronic kidney disease, stage 4 (severe): Secondary | ICD-10-CM | POA: Diagnosis not present

## 2019-02-08 DIAGNOSIS — Z794 Long term (current) use of insulin: Secondary | ICD-10-CM

## 2019-02-08 DIAGNOSIS — R109 Unspecified abdominal pain: Secondary | ICD-10-CM | POA: Diagnosis not present

## 2019-02-08 LAB — TSH
TSH: 0.67 u[IU]/mL (ref 0.35–4.50)
TSH: 0.49 u[IU]/mL (ref 0.35–4.50)

## 2019-02-08 LAB — CBC WITH DIFFERENTIAL/PLATELET
Basophils Absolute: 0.1 10*3/uL (ref 0.0–0.1)
Basophils Relative: 0.7 % (ref 0.0–3.0)
Eosinophils Absolute: 0 10*3/uL (ref 0.0–0.7)
Eosinophils Relative: 0.4 % (ref 0.0–5.0)
HCT: 33.1 % — ABNORMAL LOW (ref 36.0–46.0)
Hemoglobin: 10.9 g/dL — ABNORMAL LOW (ref 12.0–15.0)
Lymphocytes Relative: 13.6 % (ref 12.0–46.0)
Lymphs Abs: 1.2 10*3/uL (ref 0.7–4.0)
MCHC: 32.9 g/dL (ref 30.0–36.0)
MCV: 95.9 fl (ref 78.0–100.0)
Monocytes Absolute: 0.3 10*3/uL (ref 0.1–1.0)
Monocytes Relative: 3.7 % (ref 3.0–12.0)
Neutro Abs: 7.3 10*3/uL (ref 1.4–7.7)
Neutrophils Relative %: 81.6 % — ABNORMAL HIGH (ref 43.0–77.0)
Platelets: 238 10*3/uL (ref 150.0–400.0)
RBC: 3.45 Mil/uL — ABNORMAL LOW (ref 3.87–5.11)
RDW: 13.9 % (ref 11.5–15.5)
WBC: 8.9 10*3/uL (ref 4.0–10.5)

## 2019-02-08 LAB — BASIC METABOLIC PANEL
BUN: 19 mg/dL (ref 6–23)
CO2: 23 mEq/L (ref 19–32)
Calcium: 9.9 mg/dL (ref 8.4–10.5)
Chloride: 102 mEq/L (ref 96–112)
Creatinine, Ser: 1.63 mg/dL — ABNORMAL HIGH (ref 0.40–1.20)
GFR: 37.86 mL/min — ABNORMAL LOW (ref >60.00)
Glucose, Bld: 242 mg/dL — ABNORMAL HIGH (ref 70–99)
Potassium: 4.6 mEq/L (ref 3.5–5.1)
Sodium: 137 mEq/L (ref 135–145)

## 2019-02-08 LAB — COMPREHENSIVE METABOLIC PANEL
ALT: 11 U/L (ref 0–35)
AST: 13 U/L (ref 0–37)
Albumin: 3.8 g/dL (ref 3.5–5.2)
Alkaline Phosphatase: 99 U/L (ref 39–117)
BUN: 20 mg/dL (ref 6–23)
CO2: 23 mEq/L (ref 19–32)
Calcium: 9.8 mg/dL (ref 8.4–10.5)
Chloride: 103 mEq/L (ref 96–112)
Creatinine, Ser: 1.62 mg/dL — ABNORMAL HIGH (ref 0.40–1.20)
GFR: 38.13 mL/min — ABNORMAL LOW (ref >60.00)
Glucose, Bld: 226 mg/dL — ABNORMAL HIGH (ref 70–99)
Potassium: 4.9 mEq/L (ref 3.5–5.1)
Sodium: 138 mEq/L (ref 135–145)
Total Bilirubin: 1.2 mg/dL (ref 0.2–1.2)
Total Protein: 7.5 g/dL (ref 6.0–8.3)

## 2019-02-08 LAB — HEPATIC FUNCTION PANEL
ALT: 10 U/L (ref 0–35)
AST: 10 U/L (ref 0–37)
Albumin: 4 g/dL (ref 3.5–5.2)
Alkaline Phosphatase: 107 U/L (ref 39–117)
Bilirubin, Direct: 0.2 mg/dL (ref 0.0–0.3)
Total Bilirubin: 1.3 mg/dL — ABNORMAL HIGH (ref 0.2–1.2)
Total Protein: 7.9 g/dL (ref 6.0–8.3)

## 2019-02-08 LAB — POCT GLYCOSYLATED HEMOGLOBIN (HGB A1C): Hemoglobin A1C: 7.2 % — AB (ref 4.0–5.6)

## 2019-02-08 LAB — LIPASE: Lipase: 56 U/L (ref 11.0–59.0)

## 2019-02-08 LAB — CBC
HCT: 32.3 % — ABNORMAL LOW (ref 36.0–46.0)
Hemoglobin: 10.5 g/dL — ABNORMAL LOW (ref 12.0–15.0)
MCHC: 32.4 g/dL (ref 30.0–36.0)
MCV: 97.1 fl (ref 78.0–100.0)
Platelets: 227 10*3/uL (ref 150.0–400.0)
RBC: 3.33 Mil/uL — ABNORMAL LOW (ref 3.87–5.11)
RDW: 14.3 % (ref 11.5–15.5)
WBC: 7.8 10*3/uL (ref 4.0–10.5)

## 2019-02-08 LAB — HEMOGLOBIN A1C: Hgb A1c MFr Bld: 7.6 % — ABNORMAL HIGH (ref 4.6–6.5)

## 2019-02-08 LAB — T4, FREE: Free T4: 0.82 ng/dL (ref 0.60–1.60)

## 2019-02-08 LAB — H. PYLORI ANTIBODY, IGG: H Pylori IgG: NEGATIVE

## 2019-02-08 MED ORDER — CEFTRIAXONE SODIUM 1 G IJ SOLR
1.0000 g | Freq: Once | INTRAMUSCULAR | Status: AC
  Administered 2019-02-08: 1 g via INTRAMUSCULAR

## 2019-02-08 MED ORDER — ONDANSETRON HCL 4 MG/2ML IJ SOLN
4.0000 mg | Freq: Once | INTRAMUSCULAR | Status: AC
  Administered 2019-02-08: 4 mg via INTRAMUSCULAR

## 2019-02-08 MED ORDER — CLONIDINE 0.1 MG/24HR TD PTWK: 0.1000 mg | MEDICATED_PATCH | TRANSDERMAL | 12 refills | Status: DC

## 2019-02-08 MED ORDER — ONDANSETRON 4 MG PO TBDP: 4.0000 mg | ORAL_TABLET | Freq: Three times a day (TID) | ORAL | 2 refills | Status: DC | PRN

## 2019-02-08 MED ORDER — METOCLOPRAMIDE HCL 5 MG PO TABS: 5.0000 mg | ORAL_TABLET | Freq: Four times a day (QID) | ORAL | 1 refills | Status: DC

## 2019-02-08 NOTE — Assessment & Plan Note (Signed)
stable overall by history and exam, recent data reviewed with pt, and pt to continue medical treatment as before,  to f/u any worsening symptoms or concerns  

## 2019-02-08 NOTE — Patient Instructions (Signed)
You had the rocephin 1 gm (antibiotic) and zofran 4 mg (for nausea) shots  Ok to try to restart the sulfa antibiotic that you have at home  Please take all new medication as prescribed - the generic Reglan for vomiting, and zofran dissolving pills for nausea  Please take all new medication as prescribed - the patch (temporarily) for blood pressure  OK to try the OTC Glycerin suppository or Dulcolox suppository or even Fleets Enema   Please continue all other medications as before, including the linzess 77  Please have the pharmacy call with any other refills you may need.  Please continue your efforts at being more active, low cholesterol diet, and weight control.  Please keep your appointments with your specialists as you may have planned  Please go to the XRAY Department in the Basement (go straight as you get off the elevator) for the x-ray testing  Please go to the LAB in the Basement (turn left off the elevator) for the tests to be done today  You will be contacted by phone if any changes need to be made immediately.  Otherwise, you will receive a letter about your results with an explanation, but please check with MyChart first.  Please remember to sign up for MyChart if you have not done so, as this will be important to you in the future with finding out test results, communicating by private email, and scheduling acute appointments online when needed.  Please return in 1 week, or sooner if needed

## 2019-02-08 NOTE — Progress Notes (Signed)
[name] is a 68 y.o. female.    Reason for Appointment: Diabetes follow-up   History of Present Illness   Diagnosis: Type 2 DIABETES MELITUS, date of diagnosis: 2000  Previous history: She had initially been treated with metformin and Amaryl and subsequently given Victoza which helped overall control and weight loss. In 9/13 because of significant hyperglycemia she was started on basal bolus insulin also. Had required relatively small doses of insulin. Her blood sugar control has been excellent with A1c between 5.3-6.0 although this may be relatively higher than expected. Tends to have relatively higher readings after supper In 6/15 she was told to resume her Victoza and stop her Lantus since she had been gaining weight with Lantus and NovoLog regimen.   Recent history:     INSULIN regimen: Novolog 7 up to 10 units units before meals     Non-insulin hypoglycemic drugs: Soliqua 26 units daily   Her A1c is 7.2, has been as low as 5.7  Current blood sugar patterns and management:  Blood sugar data is only available 60% of the time I discussed in detail below  Despite her getting periodic low blood sugars overnight she has not reduced her [name]  Also tends to forget to take her insulin before eating and may take it after  Also may not be adjusting her mealtime dose based on what she is eating  Currently still eating about 2 meals a day at noon and 5 PM but not clear if she may be getting snacks late in the evening also She is having mostly low blood sugars overnight likely from her getting excessive Lantus Also has fairly consistently high readings above her target of 180 after about 5 PM when she is eating dinner As above however she has incomplete data The last 3 days she has had significant nausea and even without taking her Soliqua and NovoLog her blood sugars have only been 135 the last 2 days    Side effects from medications:  none  Monitors blood  glucose: Once a day or less on average .    Glucometer:  Freestyle Libre/Accu-Chek .         CONTINUOUS GLUCOSE MONITORING RECORD INTERPRETATION    Dates of Recording: 4/21 through 02/08/2019  Sensor description: Freestyle libre Blood sugars are not being checked consistently and data is incomplete on most of the days and missing on 4/22.  Results statistics:   CGM use % of time  60  Average and SD  150 +/-41, was 120  Time in range  52     %  % Time Above 180  29  % Time above 250  5  % Time Below target  14    Glycemic patterns summary: Blood sugars are quite variable through the night with some periods of hypoglycemia early during the night.  Blood sugars are usually rising gradually after about 2 PM the highest readings around 11 PM  Hyperglycemic episodes are occurring primarily in the evening after dinner but occasionally in the early afternoon also after lunch.  Design consistent  Hypoglycemic episodes occurred mostly overnight, occasionally late morning  Overnight periods: Blood sugars are relatively high starting at midnight and gradually decreasing Hypoglycemia occurring on about 4 nights during the recording  Preprandial periods: Blood sugars are averaging about 106 before lunch at noon with some variability Blood sugars are mostly higher around 180 average before dinner  Postprandial periods: Blood sugars are rising variably on different days  depending on her diet and compliance with insulin with periodic spikes after lunch or dinner except for the last 3 days when she has had minimal intake and inadequate recording also   PRE-MEAL Fasting Lunch Dinner Bedtime Overall  Glucose range:       Mean/median:  106   193  221  150   POST-MEAL PC Breakfast PC Lunch PC Dinner  Glucose range:     Mean/median:   168  204   Previous readings:    PRE-MEAL Fasting Lunch Dinner Bedtime Overall  Glucose range:       Averages:  79  88  153   120   POST-MEAL PC Breakfast PC  Lunch PC Dinner  Glucose range:     Averages:  137   220    Meals: 1-2 meals per day. Lunch 12 Supper at 5 PM            Physical activity: exercise: Limited, has had fatigue, back and leg pain.            Dietician visit: Most recent: 02/931            Complications: are: None  Wt Readings from Last 3 Encounters:  02/08/19 278 lb 6.4 oz (126.3 kg)  01/25/19 250 lb (113.4 kg)  12/07/18 291 lb (132 kg)    LABS:  Lab Results  Component Value Date   HGBA1C 6.7 (H) 08/28/2018   HGBA1C 5.7 (A) 07/22/2018   HGBA1C 0 07/22/2018   HGBA1C 0 (A) 07/22/2018   HGBA1C 0.0 07/22/2018   Lab Results  Component Value Date   MICROALBUR 2.0 (H) 12/31/2017   LDLCALC 85 06/15/2018   CREATININE 1.39 (H) 09/07/2018    Other active problems: See review of systems   No visits with results within 1 Week(s) from this visit.  Latest known visit with results is:  Office Visit on 12/07/2018  Component Date Value Ref Range Status  . Clinical Information: 12/07/2018    Final   SPATULA  . LMP: 12/07/2018    Final   HYST  . PREV. PAP: 12/07/2018    Final   NONE GIVEN  . PREV. BX: 12/07/2018    Final   NONE GIVEN  . HPV DNA Probe-Source 12/07/2018    Final   Vagina  . STATEMENT OF ADEQUACY: 12/07/2018    Final   SATISFACTORY FOR EVALUATION  . INTERPRETATION/RESULT: 12/07/2018    Final   Negative for intraepithelial lesion or malignancy.  . Comment: 12/07/2018    Final   Comment: This case could not be evaluated with computer assisted technology. The slide was manually screened according to routine procedures.   . CYTOTECHNOLOGIST: 12/07/2018    Final   Comment: [facility], CT(HEW) CT screening location: [address], [place], [place] [address] EXPLANATORY NOTE:  . The Pap is a screening test for cervical cancer. It is  not a diagnostic test and is subject to false negative  and false positive results. It is most reliable when a  satisfactory sample, regularly obtained, is  submitted  with relevant clinical findings and history, and when  the Pap result is evaluated along with historic and  current clinical information. .     Allergies as of 02/08/2019      Reactions   Ciprofloxacin Shortness Of Breath, Other (See Comments)   Dizziness   Hydrocodone Shortness Of Breath, Other (See Comments)   Tachycardia   Penicillins Hives   Pt just remembers hives Has patient  had a PCN reaction causing immediate rash, facial/tongue/throat swelling, SOB or lightheadedness with hypotension: No Has patient had a PCN reaction causing severe rash involving mucus membranes or skin necrosis: No Has patient had a PCN reaction that required hospitalization No Has patient had a PCN reaction occurring within the last 10 years: No If all of the above answers are "NO", then may proceed with Cephalosporin use.      Medication List       Accurate as of Feb 08, 2019 10:48 AM. Always use your most recent med list.        aspirin 81 MG EC tablet Take 81 mg by mouth every evening.   atorvastatin 40 MG tablet Commonly known as:  LIPITOR Take 1 tablet (40 mg total) by mouth at bedtime.   buPROPion 150 MG 24 hr tablet Commonly known as:  Wellbutrin XL Take 1 tablet (150 mg total) by mouth daily.   cloNIDine 0.1 MG tablet Commonly known as:  CATAPRES TAKE ONE TABLET BY MOUTH TWICE A DAY   Elmiron 100 MG capsule Generic drug:  pentosan polysulfate Take 200 mg by mouth 2 (two) times daily.   estradiol 0.1 mg/24hr patch Commonly known as:  CLIMARA - Dosed in mg/24 hr Place 1 patch (0.1 mg total) onto the skin once a week.   FLUoxetine 20 MG capsule Commonly known as:  PROZAC TAKE 1 CAPSULE BY MOUTH DAILY   FreeStyle Libre 14 Day Reader Kerrin Mo by Does not apply route.   gabapentin 100 MG capsule Commonly known as:  NEURONTIN Take 1 capsule (100 mg total) by mouth 3 (three) times daily.   glucose blood test strip Commonly known as:  FREESTYLE TEST STRIPS Use as  instructed to check blood sugar 4 times daily.   insulin aspart 100 UNIT/ML FlexPen Commonly known as:  NovoLOG FlexPen 5 to 7 units before meals   Insulin Glargine-Lixisenatide 100-33 UNT-MCG/ML Sopn Commonly known as:  Soliqua Inject 26 Units into the skin daily with breakfast.   levETIRAcetam 250 MG tablet Commonly known as:  KEPPRA Take 500 mg by mouth 2 (two) times daily.   levothyroxine 125 MCG tablet Commonly known as:  SYNTHROID 1 tablet daily and extra half tablet on Sundays   linaclotide 72 MCG capsule Commonly known as:  Linzess Take 1 capsule (72 mcg total) by mouth daily before breakfast.   losartan 100 MG tablet Commonly known as:  COZAAR TAKE 1 TABLET(100 MG) BY MOUTH DAILY   metoprolol succinate 50 MG 24 hr tablet Commonly known as:  TOPROL-XL Take 1 tablet (50 mg total) by mouth daily. Take with or immediately following a meal.   ondansetron 4 MG disintegrating tablet Commonly known as:  Zofran ODT Take 1 tablet (4 mg total) by mouth every 8 (eight) hours as needed.   pantoprazole 40 MG tablet Commonly known as:  Protonix Take 1 tablet (40 mg total) by mouth daily.   torsemide 20 MG tablet Commonly known as:  DEMADEX Take 20 mg by mouth daily.   trimethoprim 100 MG tablet Commonly known as:  TRIMPEX Take 100 mg by mouth at bedtime.       Allergies:  Allergies  Allergen Reactions  . Ciprofloxacin Shortness Of Breath and Other (See Comments)    Dizziness   . Hydrocodone Shortness Of Breath and Other (See Comments)    Tachycardia  . Penicillins Hives    Pt just remembers hives Has patient had a PCN reaction causing immediate rash, facial/tongue/throat swelling, SOB or  lightheadedness with hypotension: No Has patient had a PCN reaction causing severe rash involving mucus membranes or skin necrosis: No Has patient had a PCN reaction that required hospitalization No Has patient had a PCN reaction occurring within the last 10 years: No If all  of the above answers are "NO", then may proceed with Cephalosporin use.     Past Medical History:  Diagnosis Date  . Anxiety   . Arthritis   . Blood dyscrasia    "FREE BLEEDER"  . CERVICAL RADICULOPATHY, LEFT   . Chronic back pain   . CKD (chronic kidney disease)    DR. SANFORD  [facility]  . COMMON MIGRAINE   . CORONARY ARTERY DISEASE   . Cough    CURRENT COLD  . Cystitis   . DEPRESSION   . Diabetes mellitus, type II ([place])   . DIVERTICULOSIS-COLON   . Dysrhythmia    palpitations  . Gastric ulcer 04/2008  . Gastroparesis   . GERD (gastroesophageal reflux disease)   . Hiatal hernia   . Hyperlipidemia   . Hypertension   . Hypothyroidism   . INSOMNIA-SLEEP DISORDER-UNSPEC   . Iron deficiency anemia   . Wears glasses     Past Surgical History:  Procedure Laterality Date  . ABDOMINAL HYSTERECTOMY    . ANTERIOR LAT LUMBAR FUSION Left 08/13/2017   Procedure: LEFT SIDED LUMBAR 3-4 LATERAL INTERBODY FUSION WITH INSTRUMENTATION AND ALLOGRAFT;  Surgeon: Phylliss Bob, MD;  Location: [facility];  Service: Orthopedics;  Laterality: Left;  LEFT SIDED LUMBAR 3-4 LATERAL INTERBODY FUSION WITH INSTRUMENTATION AND ALLOGRAFT; REQUEST 3 HOURS  . Back Stimulator  07/2018  . BACK SURGERY  03/2016  . BREAST EXCISIONAL BIOPSY Right    40 years ago  . CHOLECYSTECTOMY  06/2009  . COLONOSCOPY    . ESOPHAGOGASTRODUODENOSCOPY    . EYE SURGERY Bilateral    lasik  . Gastric Wedge resection lipoma  11/2007   x2 with laparotomy and gastrostomy  . JOINT REPLACEMENT    . Left knee replacement    . Rigth knee replacement with revision  04/2008   Dr. Berenice Primas  . ROTATOR CUFF REPAIR Left 01/2009  . s/p bladder surgury  09/2009   Dr. Terance Hart  . SPINAL CORD STIMULATOR INSERTION N/A 09/11/2018   Procedure: LUMBAR SPINAL CORD STIMULATOR INSERTION;  Surgeon: Clydell Hakim, MD;  Location: [facility];  Service: Neurosurgery;  Laterality: N/A;  LUMBAR SPINAL CORD STIMULATOR INSERTION    Family History  Problem  Relation Age of Onset  . Diabetes Sister        x 3  . Heart disease Sister        x2  . Breast cancer Sister 35  . Diabetes Brother        x3  . Heart disease Brother        x2  . Coronary artery disease Other        female 1st degree  . Hypertension Other   . Breast cancer Sister 13  . Breast cancer Other 25  . Diabetes Brother   . Colon cancer Neg Hx     Social History:  reports that she has quit smoking. She has never used smokeless tobacco. She reports that she does not drink alcohol or use drugs.  Review of Systems:  Hypertension: Her blood pressure is treated with only losartan 100 mg only and followed by nephrologist She says her blood pressures been running 90 diastolic at home   CKD: Her creatinine is variable,  followed by nephrologist and recently better No hyperkalemia  Lab Results  Component Value Date   CREATININE 1.39 (H) 09/07/2018   CREATININE 1.45 (H) 08/28/2018   CREATININE 1.49 (H) 06/15/2018   CREATININE 1.53 (H) 04/05/2018   Lab Results  Component Value Date   K 4.1 09/07/2018     Lipids: Followed by PCP, LDL has been controlled  Taking Lipitor 40 mg with results as follows  Lab Results  Component Value Date   CHOL 150 06/15/2018   HDL 50.00 06/15/2018   LDLCALC 85 06/15/2018   LDLDIRECT 156.6 01/07/2011   TRIG 77.0 06/15/2018   CHOLHDL 3 06/15/2018     Post ablative hypothyroidism:  Previously she had I-131 treatment on 08/20/11 for Graves' disease  Her dose has been adjusted periodically Since her TSH was markedly increased in 3/19 her dose was increased up to 125 mcg  However subsequently her TSH has been variable On her last visit she was taking 125 mcg, 6 days a week Although she was not complaining of significant fatigue and did not have any interacting with substances with her thyroid supplement her TSH was high at 15 She is now taking 7-1/2 pills a week for day 125 dose  She is taking her levothyroxine regularly Her TSH  is pending     Lab Results  Component Value Date   TSH 0.98 11/02/2018   TSH 15.36 (H) 08/28/2018   TSH 0.41 06/15/2018   FREET4 0.93 11/02/2018   FREET4 0.69 08/28/2018   FREET4 0.90 06/15/2018    On Gabapentin for neuropathy    Examination:   BP (!) 160/80 (BP Location: Left Arm, Patient Position: Sitting, Cuff Size: Large)   Pulse 72   Ht 5\' 5"  (1.651 m)   Wt 278 lb 6.4 oz (126.3 kg)   SpO2 98%   BMI 46.33 kg/m    Body mass index is 46.33 kg/m.     ASSESSMENT/ PLAN:   Diabetes type 2,  with obesity:   See history of present illness for detailed discussion of current management, blood sugar patterns and problems identified  A1C is relatively higher at 7.2  However as discussed above her compliance with mealtime insulin is inadequate and she does not understand the need to take NovoLog before every meal that has carbohydrate Her daily patterns, need for more complete glucose monitoring with her freestyle sensor and problems identified were discussed in detail She is also getting low blood sugars from the Lantus component of her [name] and needs to reduce it when she has low sugars overnight which she has not been doing She is also not able to do much exercise or lose weight  Recommendations for diabetes:  She does need to check her sugars 3 times a day or more with her freestyle libre to enable better analysis of her blood sugar patterns  This will also help her comply with her mealtime insulin if she is seeing high readings after certain meals  She can still take 7 to 10 units of NovoLog but she must do this before every meal that has carbohydrate or high fat content and take it regardless of what the blood sugar before eating  She will reduce her Soliqua down to 22 units and may need further reduction if overnight blood sugars get low  Avoid excessive snacks including after supper at night  Recent persistent nausea and some vomiting: She will call PCP for  appointment today  Hypothyroidism: Her TSH needs to be rechecked  Currently on Synthroid 7-1/2 tablets a week which is the equivalent of 133 mcg average daily dose    Hypertension: Not controlled and still has high readings, needs to follow-up with PCP   Counseling time on subjects discussed in assessment and plan sections is over 50% of today's 25 minute visit      There are no Patient Instructions on file for this visit.      Elayne Snare 02/08/2019, 10:48 AM   Note: This office note was prepared with Dragon voice recognition system technology. Any transcriptional errors that result from this process are unintentional.

## 2019-02-08 NOTE — Assessment & Plan Note (Addendum)
?   suboptimal tx, for urine studies, rocephin 1 gm IM today, and ok to restart the septra tomorrow as does not seem to be related to the n/v  Note:  Total time for pt hx, exam, review of record with pt in the room, determination of diagnoses and plan for further eval and tx is > 40 min, with over 50% spent in coordination and counseling of patient including the differential dx, tx, further evaluation and other management of UTI, n/v, abd pain, DM, CKD, HTN, constipation

## 2019-02-08 NOTE — Progress Notes (Signed)
Subjective:    Patient ID: [name], female    DOB: 1951-05-14, 68 y.o.   MRN: [id number]  HPI  Here with c/o elevated BP, states has not been able to take any po meds for 4 days due to recurrent nausea with vomiting and mild epigastric pain, believes she may be constipated but had normal BM to her yesterday and not improved with glycerin suppository today.  Has hx of gastroparesis not symptomatic recently.  Also tx for UTI per urology approx 1 wk ago, but could only take 3 days of what sounds like septra.  Has not been able to keep down the antibiotic and BP meds   Denies urinary symptoms such as dysuria, frequency, urgency, flank pain, hematuria or n/v, fever, chills. BP Readings from Last 3 Encounters:  02/08/19 (!) 162/82  02/08/19 (!) 160/80  12/07/18 126/86   Wt Readings from Last 3 Encounters:  02/08/19 278 lb (126.1 kg)  02/08/19 278 lb 6.4 oz (126.3 kg)  01/25/19 250 lb (113.4 kg)   Past Medical History:  Diagnosis Date  . Anxiety   . Arthritis   . Blood dyscrasia    "FREE BLEEDER"  . CERVICAL RADICULOPATHY, LEFT   . Chronic back pain   . CKD (chronic kidney disease)    DR. SANFORD  [facility]  . COMMON MIGRAINE   . CORONARY ARTERY DISEASE   . Cough    CURRENT COLD  . Cystitis   . DEPRESSION   . Diabetes mellitus, type II ([place])   . DIVERTICULOSIS-COLON   . Dysrhythmia    palpitations  . Gastric ulcer 04/2008  . Gastroparesis   . GERD (gastroesophageal reflux disease)   . Hiatal hernia   . Hyperlipidemia   . Hypertension   . Hypothyroidism   . INSOMNIA-SLEEP DISORDER-UNSPEC   . Iron deficiency anemia   . Wears glasses    Past Surgical History:  Procedure Laterality Date  . ABDOMINAL HYSTERECTOMY    . ANTERIOR LAT LUMBAR FUSION Left 08/13/2017   Procedure: LEFT SIDED LUMBAR 3-4 LATERAL INTERBODY FUSION WITH INSTRUMENTATION AND ALLOGRAFT;  Surgeon: Phylliss Bob, MD;  Location: [facility];  Service: Orthopedics;  Laterality: Left;  LEFT SIDED LUMBAR 3-4  LATERAL INTERBODY FUSION WITH INSTRUMENTATION AND ALLOGRAFT; REQUEST 3 HOURS  . Back Stimulator  07/2018  . BACK SURGERY  03/2016  . BREAST EXCISIONAL BIOPSY Right    40 years ago  . CHOLECYSTECTOMY  06/2009  . COLONOSCOPY    . ESOPHAGOGASTRODUODENOSCOPY    . EYE SURGERY Bilateral    lasik  . Gastric Wedge resection lipoma  11/2007   x2 with laparotomy and gastrostomy  . JOINT REPLACEMENT    . Left knee replacement    . Rigth knee replacement with revision  04/2008   Dr. Berenice Primas  . ROTATOR CUFF REPAIR Left 01/2009  . s/p bladder surgury  09/2009   Dr. Terance Hart  . SPINAL CORD STIMULATOR INSERTION N/A 09/11/2018   Procedure: LUMBAR SPINAL CORD STIMULATOR INSERTION;  Surgeon: Clydell Hakim, MD;  Location: [facility];  Service: Neurosurgery;  Laterality: N/A;  LUMBAR SPINAL CORD STIMULATOR INSERTION    reports that she has quit smoking. She has never used smokeless tobacco. She reports that she does not drink alcohol or use drugs. family history includes Breast cancer (age of onset: 59) in an other family member; Breast cancer (age of onset: 35) in her sister; Breast cancer (age of onset: 70) in her sister; Coronary artery disease in an other family  member; Diabetes in her brother, brother, and sister; Heart disease in her brother and sister; Hypertension in an other family member. Allergies  Allergen Reactions  . Ciprofloxacin Shortness Of Breath and Other (See Comments)    Dizziness   . Hydrocodone Shortness Of Breath and Other (See Comments)    Tachycardia  . Penicillins Hives    Pt just remembers hives Has patient had a PCN reaction causing immediate rash, facial/tongue/throat swelling, SOB or lightheadedness with hypotension: No Has patient had a PCN reaction causing severe rash involving mucus membranes or skin necrosis: No Has patient had a PCN reaction that required hospitalization No Has patient had a PCN reaction occurring within the last 10 years: No If all of the above answers are  "NO", then may proceed with Cephalosporin use.    Current Outpatient Medications on File Prior to Visit  Medication Sig Dispense Refill  . aspirin 81 MG EC tablet Take 81 mg by mouth every evening.     Marland Kitchen atorvastatin (LIPITOR) 40 MG tablet Take 1 tablet (40 mg total) by mouth at bedtime. 90 tablet 3  . buPROPion (WELLBUTRIN XL) 150 MG 24 hr tablet Take 1 tablet (150 mg total) by mouth daily. 90 tablet 3  . cloNIDine (CATAPRES) 0.1 MG tablet TAKE ONE TABLET BY MOUTH TWICE A DAY 60 tablet 2  . Continuous Blood Gluc Receiver (FREESTYLE LIBRE 14 DAY READER) DEVI by Does not apply route.    Marland Kitchen ELMIRON 100 MG capsule Take 200 mg by mouth 2 (two) times daily.     Marland Kitchen estradiol (CLIMARA - DOSED IN MG/24 HR) 0.1 mg/24hr patch Place 1 patch (0.1 mg total) onto the skin once a week. 12 patch 4  . FLUoxetine (PROZAC) 20 MG capsule TAKE 1 CAPSULE BY MOUTH DAILY 90 capsule 3  . gabapentin (NEURONTIN) 100 MG capsule Take 1 capsule (100 mg total) by mouth 3 (three) times daily. 90 capsule 5  . glucose blood (FREESTYLE TEST STRIPS) test strip Use as instructed to check blood sugar 4 times daily. 100 each 3  . insulin aspart (NOVOLOG FLEXPEN) 100 UNIT/ML FlexPen 5 to 7 units before meals 15 pen 2  . Insulin Glargine-Lixisenatide (SOLIQUA) 100-33 UNT-MCG/ML SOPN Inject 26 Units into the skin daily with breakfast. 3 pen 3  . levETIRAcetam (KEPPRA) 250 MG tablet Take 500 mg by mouth 2 (two) times daily.     Marland Kitchen levothyroxine (SYNTHROID) 125 MCG tablet 1 tablet daily and extra half tablet on Sundays 96 tablet 3  . linaclotide (LINZESS) 72 MCG capsule Take 1 capsule (72 mcg total) by mouth daily before breakfast. 30 capsule 11  . losartan (COZAAR) 100 MG tablet TAKE 1 TABLET(100 MG) BY MOUTH DAILY 90 tablet 3  . metoprolol succinate (TOPROL-XL) 50 MG 24 hr tablet Take 1 tablet (50 mg total) by mouth daily. Take with or immediately following a meal. 90 tablet 3  . pantoprazole (PROTONIX) 40 MG tablet Take 1 tablet (40 mg  total) by mouth daily. 90 tablet 3  . torsemide (DEMADEX) 20 MG tablet Take 20 mg by mouth daily.    Marland Kitchen trimethoprim (TRIMPEX) 100 MG tablet Take 100 mg by mouth at bedtime.     No current facility-administered medications on file prior to visit.    Review of Systems  Constitutional: Negative for other unusual diaphoresis or sweats HENT: Negative for ear discharge or swelling Eyes: Negative for other worsening visual disturbances Respiratory: Negative for stridor or other swelling  Gastrointestinal: Negative for worsening distension  or other blood Genitourinary: Negative for retention or other urinary change Musculoskeletal: Negative for other MSK pain or swelling Skin: Negative for color change or other new lesions Neurological: Negative for worsening tremors and other numbness  Psychiatric/Behavioral: Negative for worsening agitation or other fatigue All other system neg per pt    Objective:   Physical Exam BP (!) 162/82 (BP Location: Left Arm, Patient Position: Sitting, Cuff Size: Large)   Pulse 83   Temp 99.2 F (37.3 C) (Oral)   Ht 5\' 5"  (1.651 m)   Wt 278 lb (126.1 kg)   SpO2 95%   BMI 46.26 kg/m  VS noted, fatigued, walks with cane, generalized weakness, chronic dysphoric Constitutional: Pt appears in NAD HENT: Head: NCAT.  Right Ear: External ear normal.  Left Ear: External ear normal.  Eyes: . Pupils are equal, round, and reactive to light. Conjunctivae and EOM are normal Nose: without d/c or deformity Neck: Neck supple. Gross normal ROM Cardiovascular: Normal rate and regular rhythm.   Pulmonary/Chest: Effort normal and breath sounds without rales or wheezing.  Abd:  Soft, NT, ND, + BS, no organomegaly except mild epigastric tender and very mild LLQ /low mid abd tender Neurological: Pt is alert. At baseline orientation, motor grossly intact Skin: Skin is warm. No rashes, other new lesions, no LE edema Psychiatric: Pt behavior is normal without agitation . No other  exam findings  Lab Results  Component Value Date   WBC 7.4 09/07/2018   HGB 11.0 (L) 09/07/2018   HCT 36.5 09/07/2018   PLT 222 09/07/2018   GLUCOSE 142 (H) 11/02/2018   CHOL 150 06/15/2018   TRIG 77.0 06/15/2018   HDL 50.00 06/15/2018   LDLDIRECT 156.6 01/07/2011   LDLCALC 85 06/15/2018   ALT 14 09/07/2018   AST 20 09/07/2018   NA 139 09/07/2018   K 4.1 09/07/2018   CL 105 09/07/2018   CREATININE 1.39 (H) 09/07/2018   BUN 16 09/07/2018   CO2 24 09/07/2018   TSH 0.98 11/02/2018   INR 1.21 08/07/2017   HGBA1C 7.2 (A) 02/08/2019   MICROALBUR 2.0 (H) 12/31/2017       Assessment & Plan:

## 2019-02-08 NOTE — Patient Instructions (Signed)
Novolog for all meals that have carbs BEFORE THE MEAL  [place] 22   Check finger stick when sugar low or > 180  Check blood sugars on waking up 3 -7 days a week  Also check blood sugars about 2 hours after meals and do this after different meals by rotation  Recommended blood sugar levels on waking up are 90-130 and about 2 hours after meal is 130-160  Please bring your blood sugar monitor to each visit, thank you

## 2019-02-08 NOTE — Assessment & Plan Note (Signed)
Not clear if related to symptoms, ok for dulcolox supp or fleets enema trial,  to f/u any worsening symptoms or concerns

## 2019-02-08 NOTE — Assessment & Plan Note (Signed)
Etiology unclear, with differential as per n/v, for abd films/cxr, and labs as ordered

## 2019-02-08 NOTE — Assessment & Plan Note (Signed)
stable overall by history and exam, recent data reviewed with pt, and pt to continue medical treatment as before,  to f/u any worsening symptoms or concerns, for lab f/u 

## 2019-02-08 NOTE — Assessment & Plan Note (Signed)
Uncontrolled due to not being able to take po meds, for clonidine 0.1 patch weekly, and restart po meds as able

## 2019-02-08 NOTE — Assessment & Plan Note (Signed)
Differential includes gastroparesis, gastritis, IBS, constipation or UTI related, for zofran 4 mg IM today, then 4 mg odt prn, reglan 5 qid x 5 days,  to f/u any worsening symptoms or concerns

## 2019-02-09 ENCOUNTER — Telehealth: Payer: Self-pay | Admitting: *Deleted

## 2019-02-09 NOTE — Telephone Encounter (Signed)
Pt had a question regarding the Clonidine patch and pill. She wanted to know if she should take the pill and use the patch (weekly). She is requesting a call back please for clarification. She voiced understanding of labs.

## 2019-02-09 NOTE — Telephone Encounter (Signed)
Pt informed of below.  

## 2019-02-09 NOTE — Telephone Encounter (Signed)
No, she would only take the patch while she cannot take pills very well  If feels better with n/v, she can go back to all pill medications for BP and everything else

## 2019-02-16 ENCOUNTER — Ambulatory Visit: Payer: Self-pay | Admitting: *Deleted

## 2019-02-16 NOTE — Telephone Encounter (Signed)
Mar[place] for [facility] in person

## 2019-02-16 NOTE — Telephone Encounter (Signed)
Patient calling with complaints of having nause since last Monday. Pt states she did have a virtual visit last week and received a shot at that time. Pt states that she was prescribed medication as well that is not working.l Pt states she is dry heaving but nothing is coming up. Pt states she is experiencing some stomach pain and currently rates it at 5 and states she has a sour taste in her mouth. Pt denies any vomiting at this time and staets she has been able to tolerate drinking fluids. Pt advised to seek treatment at Urgent Care/ED if she becomes worse. Pt notified that once office opens tomorrow she will be contacted within 2 hours to have appt scheduled. Pt given home care advice to help with symptoms during the night. Pt verbalized understanding. Best contact number for pt is [phone].  Reason for Disposition . Nausea lasts > 1 week  Answer Assessment - Initial Assessment Questions 1. NAUSEA SEVERITY: "How bad is the nausea?" (e.g., mild, moderate, severe; dehydration, weight loss)   - MILD: loss of appetite without change in eating habits   - MODERATE: decreased oral intake without significant weight loss, dehydration, or malnutrition   - SEVERE: inadequate caloric or fluid intake, significant weight loss, symptoms of dehydration     moderate 2. ONSET: "When did the nausea begin?"    Over a week ago 3. VOMITING: "Any vomiting?" If so, ask: "How many times today?"     No 4. RECURRENT SYMPTOM: "Have you had nausea before?" If so, ask: "When was the last time?" "What happened that time?"     Yes has been having nausea for over a week 5. CAUSE: "What do you think is causing the nausea?"     unknown 6. PREGNANCY: "Is there any chance you are pregnant?" (e.g., unprotected intercourse, missed birth control pill, broken condom)     N/a  Protocols used: NAUSEA-A-AH

## 2019-02-17 ENCOUNTER — Encounter: Payer: Self-pay | Admitting: Internal Medicine

## 2019-02-17 ENCOUNTER — Other Ambulatory Visit (INDEPENDENT_AMBULATORY_CARE_PROVIDER_SITE_OTHER): Payer: Medicare Other

## 2019-02-17 ENCOUNTER — Other Ambulatory Visit: Payer: Self-pay

## 2019-02-17 ENCOUNTER — Ambulatory Visit (INDEPENDENT_AMBULATORY_CARE_PROVIDER_SITE_OTHER): Payer: Medicare Other | Admitting: Internal Medicine

## 2019-02-17 ENCOUNTER — Ambulatory Visit: Payer: Medicare Other | Admitting: Family

## 2019-02-17 VITALS — BP 142/82 | HR 63 | Temp 98.5°F | Resp 18 | Ht 65.0 in | Wt 286.0 lb

## 2019-02-17 DIAGNOSIS — R112 Nausea with vomiting, unspecified: Secondary | ICD-10-CM | POA: Diagnosis not present

## 2019-02-17 DIAGNOSIS — R109 Unspecified abdominal pain: Secondary | ICD-10-CM

## 2019-02-17 LAB — CBC WITH DIFFERENTIAL/PLATELET
Basophils Absolute: 0.1 10*3/uL (ref 0.0–0.1)
Basophils Relative: 0.9 % (ref 0.0–3.0)
Eosinophils Absolute: 0.2 10*3/uL (ref 0.0–0.7)
Eosinophils Relative: 2.5 % (ref 0.0–5.0)
HCT: 32.2 % — ABNORMAL LOW (ref 36.0–46.0)
Hemoglobin: 10.6 g/dL — ABNORMAL LOW (ref 12.0–15.0)
Lymphocytes Relative: 23.9 % (ref 12.0–46.0)
Lymphs Abs: 1.5 10*3/uL (ref 0.7–4.0)
MCHC: 33 g/dL (ref 30.0–36.0)
MCV: 96.9 fl (ref 78.0–100.0)
Monocytes Absolute: 0.3 10*3/uL (ref 0.1–1.0)
Monocytes Relative: 4.5 % (ref 3.0–12.0)
Neutro Abs: 4.3 10*3/uL (ref 1.4–7.7)
Neutrophils Relative %: 68.2 % (ref 43.0–77.0)
Platelets: 222 10*3/uL (ref 150.0–400.0)
RBC: 3.32 Mil/uL — ABNORMAL LOW (ref 3.87–5.11)
RDW: 14.3 % (ref 11.5–15.5)
WBC: 6.3 10*3/uL (ref 4.0–10.5)

## 2019-02-17 LAB — COMPREHENSIVE METABOLIC PANEL
ALT: 10 U/L (ref 0–35)
AST: 9 U/L (ref 0–37)
Albumin: 3.8 g/dL (ref 3.5–5.2)
Alkaline Phosphatase: 93 U/L (ref 39–117)
BUN: 19 mg/dL (ref 6–23)
CO2: 25 mEq/L (ref 19–32)
Calcium: 9.5 mg/dL (ref 8.4–10.5)
Chloride: 103 mEq/L (ref 96–112)
Creatinine, Ser: 1.65 mg/dL — ABNORMAL HIGH (ref 0.40–1.20)
GFR: 37.32 mL/min — ABNORMAL LOW (ref >60.00)
Glucose, Bld: 200 mg/dL — ABNORMAL HIGH (ref 70–99)
Potassium: 5 mEq/L (ref 3.5–5.1)
Sodium: 136 mEq/L (ref 135–145)
Total Bilirubin: 0.7 mg/dL (ref 0.2–1.2)
Total Protein: 7.3 g/dL (ref 6.0–8.3)

## 2019-02-17 LAB — LIPASE: Lipase: 28 U/L (ref 11.0–59.0)

## 2019-02-17 LAB — AMYLASE: Amylase: 44 U/L (ref 27–131)

## 2019-02-17 MED ORDER — PROMETHAZINE HCL 25 MG PO TABS: 25.0000 mg | ORAL_TABLET | Freq: Three times a day (TID) | ORAL | 0 refills | Status: DC | PRN

## 2019-02-17 NOTE — Assessment & Plan Note (Signed)
Having nausea and vomiting and has likely some degree of dehydration Zofran and Reglan when not effective Promethazine Possibly related to severe constipation, gastroparesis or diverticulitis Will recheck CBC, CMP, amylase and lipase We will get a CT scan given the abdominal pain that has been persistent for a week and she does have a history of diverticulitis-we will not be able to get the scan until tomorrow

## 2019-02-17 NOTE — Telephone Encounter (Signed)
Seeing you at 3 today.

## 2019-02-17 NOTE — Telephone Encounter (Signed)
Noted  

## 2019-02-17 NOTE — Progress Notes (Signed)
Subjective:    Patient ID: [name], female    DOB: June 26, 1951, 68 y.o.   MRN: [id number]  HPI The patient is here for an acute visit.   Nausea:  It started two weeks ago.  She has been vomiting and she is having difficulty keeping food and liquids down she was prescribed both Reglan and Zofran and she states they did not help much.  She has chronic constipation and is taking linzess 72 mcg daily.  She does not feel that her constipation is currently well controlled.  Her last BM was three nights ago and it was not a complete BM.  She has LMQ abdominal pain that started last week.  It did get better since last week and has gotten worse again.  It is currently a 5/10.  Last week she was taking Bactrim for a UTI.  She is experiencing GERD, but prior to all of the nausea and vomiting her GERD was controlled.  She denies any blood in the stool, fevers or chills.  She does have some headaches, but feels it is from not eating.  She did try taking a laxative, but it did not help her constipation.  She was able to eat some Jell-O before she came over today and it has stayed down.  She has not been able to keep many fluids down.  She does have a history of diverticulitis-years ago.  Her last colonoscopy was 2008 and she had a few diverticula in the sigmoid colon.  She states that her current symptoms are not what she experienced with diverticulitis in the past.  They are also not what she typically experiences with her gastroparesis or constipation.  She has not had any issues with her gastroparesis in a long time.  Her constipation she does struggle with.  Sometimes the Linzess does not always work.  Medications and allergies reviewed with patient and updated if appropriate.  Patient Active Problem List   Diagnosis Date Noted  . Nausea & vomiting 02/08/2019  . Abdominal pain 02/08/2019  . Constipation 02/08/2019  . UTI (urinary tract infection) 02/08/2019  . Upper abdominal pain  01/21/2019  . Cervical radiculopathy at C6 01/28/2018  . Left lateral epicondylitis 01/23/2018  . Obesity 01/23/2018  . Weakness 05/01/2017  . Hypothyroidism 05/01/2017  . Hyperkalemia 05/01/2017  . Acute renal failure superimposed on stage 4 chronic kidney disease ([place]) 05/01/2017  . Hypotension 05/01/2017  . Sepsis ([place]) 05/01/2017  . Dysuria 04/23/2017  . Right leg swelling 12/27/2016  . Leg pain, lateral, right 11/05/2016  . Radiculopathy 04/03/2016  . Hypersomnolence 11/23/2015  . Right sided abdominal pain 07/25/2015  . Malaise and fatigue 11/05/2014  . Fracture of fifth metacarpal bone of right hand 07/08/2014  . Right cervical radiculopathy 06/27/2014  . Chronic interstitial cystitis 05/23/2014  . Osteoarthritis of [place] joint of thumb 04/11/2014  . De Quervain's tenosynovitis, left 03/14/2014  . CKD (chronic kidney disease) stage 4, GFR 15-29 ml/min (HCC) 03/03/2014  . Lower back pain 03/03/2014  . Ingrown nail 03/03/2014  . Peripheral edema 02/23/2014  . Lateral epicondylitis of right elbow 01/31/2014  . Neck pain on left side 11/19/2013  . Abdominal discomfort 10/12/2012  . Right lumbar radiculopathy 05/11/2012  . Peripheral neuropathy ([place]) 05/11/2012  . Vertigo 03/29/2012  . Headache(784.0) 03/10/2012  . Lumbar radiculopathy 10/24/2011  . 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 Right leg weakness 04/04/2011  . Hematochezia 01/07/2011  . Preventative health care 01/07/2011  . CHOLELITHIASIS 05/04/2009  .  GERD 04/26/2009  . Gastroparesis 04/26/2009  . ULCER-GASTRIC 03/15/2009  . DIVERTICULOSIS-COLON 03/15/2009  . Cervical radiculopathy 10/28/2008  . MENOPAUSAL DISORDER 06/29/2008  . Pain in joint, shoulder region 10/12/2007  . INSOMNIA-SLEEP DISORDER-UNSPEC 09/01/2007  . Depression 09/01/2007  . ANEMIA-NOS 08/13/2007  . COMMON MIGRAINE 08/13/2007  . 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 Diabetes ([place]) 05/06/2007  . HLD (hyperlipidemia) 05/06/2007   . ANXIETY 05/06/2007  . Essential hypertension 05/06/2007  . Coronary atherosclerosis 05/06/2007    Current Outpatient Medications on File Prior to Visit  Medication Sig Dispense Refill  . aspirin 81 MG EC tablet Take 81 mg by mouth every evening.     Marland Kitchen atorvastatin (LIPITOR) 40 MG tablet Take 1 tablet (40 mg total) by mouth at bedtime. 90 tablet 3  . buPROPion (WELLBUTRIN XL) 150 MG 24 hr tablet Take 1 tablet (150 mg total) by mouth daily. 90 tablet 3  . cloNIDine (CATAPRES - DOSED IN MG/24 HR) 0.1 mg/24hr patch Place 1 patch (0.1 mg total) onto the skin once a week. 4 patch 12  . Continuous Blood Gluc Receiver (FREESTYLE LIBRE 14 DAY READER) DEVI by Does not apply route.    Marland Kitchen ELMIRON 100 MG capsule Take 200 mg by mouth 2 (two) times daily.     Marland Kitchen estradiol (CLIMARA - DOSED IN MG/24 HR) 0.1 mg/24hr patch Place 1 patch (0.1 mg total) onto the skin once a week. 12 patch 4  . FLUoxetine (PROZAC) 20 MG capsule TAKE 1 CAPSULE BY MOUTH DAILY 90 capsule 3  . gabapentin (NEURONTIN) 100 MG capsule Take 1 capsule (100 mg total) by mouth 3 (three) times daily. 90 capsule 5  . glucose blood (FREESTYLE TEST STRIPS) test strip Use as instructed to check blood sugar 4 times daily. 100 each 3  . insulin aspart (NOVOLOG FLEXPEN) 100 UNIT/ML FlexPen 5 to 7 units before meals 15 pen 2  . Insulin Glargine-Lixisenatide (SOLIQUA) 100-33 UNT-MCG/ML SOPN Inject 26 Units into the skin daily with breakfast. 3 pen 3  . levETIRAcetam (KEPPRA) 250 MG tablet Take 500 mg by mouth 2 (two) times daily.     Marland Kitchen levothyroxine (SYNTHROID) 125 MCG tablet 1 tablet daily and extra half tablet on Sundays 96 tablet 3  . linaclotide (LINZESS) 72 MCG capsule Take 1 capsule (72 mcg total) by mouth daily before breakfast. 30 capsule 11  . losartan (COZAAR) 100 MG tablet TAKE 1 TABLET(100 MG) BY MOUTH DAILY 90 tablet 3  . metoprolol succinate (TOPROL-XL) 50 MG 24 hr tablet Take 1 tablet (50 mg total) by mouth daily. Take with or  immediately following a meal. 90 tablet 3  . pantoprazole (PROTONIX) 40 MG tablet Take 1 tablet (40 mg total) by mouth daily. 90 tablet 3  . torsemide (DEMADEX) 20 MG tablet Take 20 mg by mouth daily.    Marland Kitchen trimethoprim (TRIMPEX) 100 MG tablet Take 100 mg by mouth at bedtime.     No current facility-administered medications on file prior to visit.     Past Medical History:  Diagnosis Date  . Anxiety   . Arthritis   . Blood dyscrasia    "FREE BLEEDER"  . CERVICAL RADICULOPATHY, LEFT   . Chronic back pain   . CKD (chronic kidney disease)    DR. SANFORD  [facility]  . COMMON MIGRAINE   . CORONARY ARTERY DISEASE   . Cough    CURRENT COLD  . Cystitis   . DEPRESSION   . Diabetes mellitus, type II ([place])   .  DIVERTICULOSIS-COLON   . Dysrhythmia    palpitations  . Gastric ulcer 04/2008  . Gastroparesis   . GERD (gastroesophageal reflux disease)   . Hiatal hernia   . Hyperlipidemia   . Hypertension   . Hypothyroidism   . INSOMNIA-SLEEP DISORDER-UNSPEC   . Iron deficiency anemia   . Wears glasses     Past Surgical History:  Procedure Laterality Date  . ABDOMINAL HYSTERECTOMY    . ANTERIOR LAT LUMBAR FUSION Left 08/13/2017   Procedure: LEFT SIDED LUMBAR 3-4 LATERAL INTERBODY FUSION WITH INSTRUMENTATION AND ALLOGRAFT;  Surgeon: Phylliss Bob, MD;  Location: [facility];  Service: Orthopedics;  Laterality: Left;  LEFT SIDED LUMBAR 3-4 LATERAL INTERBODY FUSION WITH INSTRUMENTATION AND ALLOGRAFT; REQUEST 3 HOURS  . Back Stimulator  07/2018  . BACK SURGERY  03/2016  . BREAST EXCISIONAL BIOPSY Right    40 years ago  . CHOLECYSTECTOMY  06/2009  . COLONOSCOPY    . ESOPHAGOGASTRODUODENOSCOPY    . EYE SURGERY Bilateral    lasik  . Gastric Wedge resection lipoma  11/2007   x2 with laparotomy and gastrostomy  . JOINT REPLACEMENT    . Left knee replacement    . Rigth knee replacement with revision  04/2008   Dr. Berenice Primas  . ROTATOR CUFF REPAIR Left 01/2009  . s/p bladder surgury  09/2009    Dr. Terance Hart  . SPINAL CORD STIMULATOR INSERTION N/A 09/11/2018   Procedure: LUMBAR SPINAL CORD STIMULATOR INSERTION;  Surgeon: Clydell Hakim, MD;  Location: [facility];  Service: Neurosurgery;  Laterality: N/A;  LUMBAR SPINAL CORD STIMULATOR INSERTION    Social History   Socioeconomic History  . Marital status: Married    Spouse name: [name]  . Number of children: 3  . Years of education: 38  . Highest education level: Not on file  Occupational History  . Occupation: disabled  Social Needs  . Financial resource strain: Not on file  . Food insecurity:    Worry: Not on file    Inability: Not on file  . Transportation needs:    Medical: Not on file    Non-medical: Not on file  Tobacco Use  . Smoking status: Former Research scientist (life sciences)  . Smokeless tobacco: Never Used  . Tobacco comment: quit 30 years ago  Substance and Sexual Activity  . Alcohol use: No    Alcohol/week: 0.0 standard drinks  . Drug use: No  . Sexual activity: Not Currently    Birth control/protection: None    Comment: 1st intercourse- 22, partners- 2, married- 4 yrs   Lifestyle  . Physical activity:    Days per week: Not on file    Minutes per session: Not on file  . Stress: Not on file  Relationships  . Social connections:    Talks on phone: Not on file    Gets together: Not on file    Attends religious service: Not on file    Active member of club or organization: Not on file    Attends meetings of clubs or organizations: Not on file    Relationship status: Not on file  Other Topics Concern  . Not on file  Social History Narrative   Patient gets regular exercise   Lives with husband   Caffeine- tea, 2 glasses a day, sodas 2-3 daily    Family History  Problem Relation Age of Onset  . Diabetes Sister        x 3  . Heart disease Sister  x2  . Breast cancer Sister 56  . Diabetes Brother        x3  . Heart disease Brother        x2  . Coronary artery disease Other        female 1st degree  .  Hypertension Other   . Breast cancer Sister 31  . Breast cancer Other 25  . Diabetes Brother   . Colon cancer Neg Hx     Review of Systems  Constitutional: Negative for chills and fever.  Gastrointestinal: Positive for abdominal pain, constipation, nausea and vomiting. Negative for blood in stool.       GERD  Genitourinary: Negative for dysuria, frequency and hematuria.  Neurological: Positive for headaches (mild, right sided). Negative for light-headedness.       Objective:   Vitals:   02/17/19 1449  BP: (!) 142/82  Pulse: 63  Resp: 18  Temp: 98.5 F (36.9 C)  SpO2: 98%   BP Readings from Last 3 Encounters:  02/17/19 (!) 142/82  02/08/19 (!) 162/82  02/08/19 (!) 160/80   Wt Readings from Last 3 Encounters:  02/17/19 286 lb (129.7 kg)  02/08/19 278 lb (126.1 kg)  02/08/19 278 lb 6.4 oz (126.3 kg)   Body mass index is 47.59 kg/m.   Physical Exam Constitutional:      General: She is not in acute distress.    Appearance: Normal appearance. She is obese. She is ill-appearing (Mildly ill-appearing-appears uncomfortable). She is not diaphoretic.  HENT:     Head: Normocephalic and atraumatic.  Abdominal:     General: There is no distension.     Palpations: Abdomen is soft. There is no mass.     Tenderness: There is abdominal tenderness (Left mid quadrant). There is no guarding or rebound.     Hernia: No hernia is present.  Musculoskeletal:     Right lower leg: No edema.     Left lower leg: No edema.  Skin:    General: Skin is warm and dry.  Neurological:     Mental Status: She is alert.            Assessment & Plan:    See Problem List for Assessment and Plan of chronic medical problems.

## 2019-02-17 NOTE — Assessment & Plan Note (Signed)
Pain is located in the left mid quadrant and is associated with nausea and vomiting Blood work and abdominal x-ray last week unremarkable ?  Pain related to diverticulitis-she states she did have this years ago and her colonoscopy from 2008 did show some mild diverticulosis in the sigmoid colon Will obtain a CT scan-this cannot be done until tomorrow Will check CBC, CMP, amylase and lipase to see if after having the symptoms for a week anything shows the possibility of infection ?  Pain related to severe constipation, which is also very possible-Linzess 72 mcg daily does not seem to be working well and she may need to periodically increase this to 145 mcg-if she takes 145 mcg daily it does cause diarrhea She did try laxative and it was not effective-may need to try an enema if there is no indication of infection on the blood work

## 2019-02-17 NOTE — Patient Instructions (Addendum)
A Ct scan was ordered.  You can go to [facility] at [facility] [place] they open at 8 am.  You will be a walk in so they will do the imaging when they can fit you in.   Have blood work done today.    Try the promethazine for the nausea.  Increase your fluids.    Continue your constipation regimen.

## 2019-02-17 NOTE — Telephone Encounter (Signed)
Patient wanted to be seen today. Patient is scheduled with Dr. Quay Burow.

## 2019-02-22 DIAGNOSIS — E785 Hyperlipidemia, unspecified: Secondary | ICD-10-CM | POA: Diagnosis not present

## 2019-02-22 DIAGNOSIS — I10 Essential (primary) hypertension: Secondary | ICD-10-CM | POA: Diagnosis not present

## 2019-02-22 DIAGNOSIS — E1122 Type 2 diabetes mellitus with diabetic chronic kidney disease: Secondary | ICD-10-CM | POA: Diagnosis not present

## 2019-02-22 DIAGNOSIS — M1990 Unspecified osteoarthritis, unspecified site: Secondary | ICD-10-CM | POA: Diagnosis not present

## 2019-02-22 DIAGNOSIS — N189 Chronic kidney disease, unspecified: Secondary | ICD-10-CM | POA: Diagnosis not present

## 2019-02-22 DIAGNOSIS — I25118 Atherosclerotic heart disease of native coronary artery with other forms of angina pectoris: Secondary | ICD-10-CM | POA: Diagnosis not present

## 2019-02-22 DIAGNOSIS — E1140 Type 2 diabetes mellitus with diabetic neuropathy, unspecified: Secondary | ICD-10-CM | POA: Diagnosis not present

## 2019-02-22 DIAGNOSIS — G894 Chronic pain syndrome: Secondary | ICD-10-CM | POA: Diagnosis not present

## 2019-03-11 ENCOUNTER — Other Ambulatory Visit: Payer: Self-pay | Admitting: Endocrinology

## 2019-03-12 ENCOUNTER — Telehealth: Payer: Self-pay | Admitting: Endocrinology

## 2019-03-12 NOTE — Telephone Encounter (Signed)
Patient is calling in regards to paperwork that was going to be faxed over to our office for her soliqua from the patient assistance.   She is calling to make sure we have received this and would like a call back on Monday

## 2019-03-15 NOTE — Telephone Encounter (Signed)
As of this time, the paperwork has not been received unless it has come via fax and has not been printed yet.

## 2019-03-16 ENCOUNTER — Other Ambulatory Visit: Payer: Self-pay | Admitting: Endocrinology

## 2019-03-23 ENCOUNTER — Encounter: Payer: Self-pay | Admitting: Internal Medicine

## 2019-03-23 ENCOUNTER — Ambulatory Visit (INDEPENDENT_AMBULATORY_CARE_PROVIDER_SITE_OTHER): Payer: Medicare Other | Admitting: Internal Medicine

## 2019-03-23 DIAGNOSIS — E785 Hyperlipidemia, unspecified: Secondary | ICD-10-CM

## 2019-03-23 DIAGNOSIS — E119 Type 2 diabetes mellitus without complications: Secondary | ICD-10-CM | POA: Diagnosis not present

## 2019-03-23 DIAGNOSIS — N184 Chronic kidney disease, stage 4 (severe): Secondary | ICD-10-CM | POA: Diagnosis not present

## 2019-03-23 MED ORDER — PROMETHAZINE HCL 25 MG PO TABS: 25.0000 mg | ORAL_TABLET | Freq: Three times a day (TID) | ORAL | 1 refills | Status: DC | PRN

## 2019-03-23 NOTE — Progress Notes (Signed)
Patient ID: [name], female   DOB: 01-Feb-1951, 68 y.o.   MRN: [id number]  Virtual Visit via Video Note  I connected with [name] on 03/23/19 at  1:40 PM EDT by a video enabled telemedicine application and verified that I am speaking with the correct person using two identifiers.  Location: Patient: at home Provider: at office   I discussed the limitations of evaluation and management by telemedicine and the availability of in person appointments. The patient expressed understanding and agreed to proceed.  History of Present Illness: Here to f/u; overall doing ok,  Pt denies chest pain, increasing sob or doe, wheezing, orthopnea, PND, increased LE swelling, palpitations, dizziness or syncope.  Pt denies new neurological symptoms such as new headache, or facial or extremity weakness or numbness.  Pt denies polydipsia, polyuria, or low sugar episode.  Pt states overall good compliance with meds, mostly trying to follow appropriate diet, with wt overall stable,  but little exercise however.  Denies worsening reflux, abd pain, dysphagia, n/v, bowel change or blood, but asks for phenergan prn nausea Past Medical History:  Diagnosis Date  . Anxiety   . Arthritis   . Blood dyscrasia    "FREE BLEEDER"  . CERVICAL RADICULOPATHY, LEFT   . Chronic back pain   . CKD (chronic kidney disease)    DR. SANFORD  [facility]  . COMMON MIGRAINE   . CORONARY ARTERY DISEASE   . Cough    CURRENT COLD  . Cystitis   . DEPRESSION   . Diabetes mellitus, type II ([place])   . DIVERTICULOSIS-COLON   . Dysrhythmia    palpitations  . Gastric ulcer 04/2008  . Gastroparesis   . GERD (gastroesophageal reflux disease)   . Hiatal hernia   . Hyperlipidemia   . Hypertension   . Hypothyroidism   . INSOMNIA-SLEEP DISORDER-UNSPEC   . Iron deficiency anemia   . Wears glasses    Past Surgical History:  Procedure Laterality Date  . ABDOMINAL HYSTERECTOMY    . ANTERIOR LAT LUMBAR FUSION Left 08/13/2017    Procedure: LEFT SIDED LUMBAR 3-4 LATERAL INTERBODY FUSION WITH INSTRUMENTATION AND ALLOGRAFT;  Surgeon: Phylliss Bob, MD;  Location: [facility];  Service: Orthopedics;  Laterality: Left;  LEFT SIDED LUMBAR 3-4 LATERAL INTERBODY FUSION WITH INSTRUMENTATION AND ALLOGRAFT; REQUEST 3 HOURS  . Back Stimulator  07/2018  . BACK SURGERY  03/2016  . BREAST EXCISIONAL BIOPSY Right    40 years ago  . CHOLECYSTECTOMY  06/2009  . COLONOSCOPY    . ESOPHAGOGASTRODUODENOSCOPY    . EYE SURGERY Bilateral    lasik  . Gastric Wedge resection lipoma  11/2007   x2 with laparotomy and gastrostomy  . JOINT REPLACEMENT    . Left knee replacement    . Rigth knee replacement with revision  04/2008   Dr. Berenice Primas  . ROTATOR CUFF REPAIR Left 01/2009  . s/p bladder surgury  09/2009   Dr. Terance Hart  . SPINAL CORD STIMULATOR INSERTION N/A 09/11/2018   Procedure: LUMBAR SPINAL CORD STIMULATOR INSERTION;  Surgeon: Clydell Hakim, MD;  Location: [facility];  Service: Neurosurgery;  Laterality: N/A;  LUMBAR SPINAL CORD STIMULATOR INSERTION    reports that she has quit smoking. She has never used smokeless tobacco. She reports that she does not drink alcohol or use drugs. family history includes Breast cancer (age of onset: 47) in an other family member; Breast cancer (age of onset: 47) in her sister; Breast cancer (age of onset: 49) in her  sister; Coronary artery disease in an other family member; Diabetes in her brother, brother, and sister; Heart disease in her brother and sister; Hypertension in an other family member. Allergies  Allergen Reactions  . Ciprofloxacin Shortness Of Breath and Other (See Comments)    Dizziness   . Hydrocodone Shortness Of Breath and Other (See Comments)    Tachycardia  . Penicillins Hives    Pt just remembers hives Has patient had a PCN reaction causing immediate rash, facial/tongue/throat swelling, SOB or lightheadedness with hypotension: No Has patient had a PCN reaction causing severe rash  involving mucus membranes or skin necrosis: No Has patient had a PCN reaction that required hospitalization No Has patient had a PCN reaction occurring within the last 10 years: No If all of the above answers are "NO", then may proceed with Cephalosporin use.    Current Outpatient Medications on File Prior to Visit  Medication Sig Dispense Refill  . aspirin 81 MG EC tablet Take 81 mg by mouth every evening.     Marland Kitchen atorvastatin (LIPITOR) 40 MG tablet Take 1 tablet (40 mg total) by mouth at bedtime. 90 tablet 3  . buPROPion (WELLBUTRIN XL) 150 MG 24 hr tablet Take 1 tablet (150 mg total) by mouth daily. 90 tablet 3  . cloNIDine (CATAPRES - DOSED IN MG/24 HR) 0.1 mg/24hr patch Place 1 patch (0.1 mg total) onto the skin once a week. 4 patch 12  . cloNIDine (CATAPRES) 0.1 MG tablet TAKE ONE TABLET BY MOUTH TWO TIMES A DAY 60 tablet 1  . Continuous Blood Gluc Receiver (FREESTYLE LIBRE 14 DAY READER) DEVI by Does not apply route.    Marland Kitchen ELMIRON 100 MG capsule Take 200 mg by mouth 2 (two) times daily.     Marland Kitchen estradiol (CLIMARA - DOSED IN MG/24 HR) 0.1 mg/24hr patch Place 1 patch (0.1 mg total) onto the skin once a week. 12 patch 4  . FLUoxetine (PROZAC) 20 MG capsule TAKE 1 CAPSULE BY MOUTH DAILY 90 capsule 3  . gabapentin (NEURONTIN) 100 MG capsule Take 1 capsule (100 mg total) by mouth 3 (three) times daily. 90 capsule 5  . glucose blood (FREESTYLE TEST STRIPS) test strip Use as instructed to check blood sugar 4 times daily. 100 each 3  . insulin aspart (NOVOLOG FLEXPEN) 100 UNIT/ML FlexPen 5 to 7 units before meals 15 pen 2  . Insulin Glargine-Lixisenatide (SOLIQUA) 100-33 UNT-MCG/ML SOPN Inject 26 Units into the skin daily with breakfast. 3 pen 3  . levETIRAcetam (KEPPRA) 250 MG tablet Take 500 mg by mouth 2 (two) times daily.     Marland Kitchen levothyroxine (SYNTHROID) 125 MCG tablet 1 tablet daily and extra half tablet on Sundays 96 tablet 3  . linaclotide (LINZESS) 72 MCG capsule Take 1 capsule (72 mcg total)  by mouth daily before breakfast. 30 capsule 11  . losartan (COZAAR) 100 MG tablet TAKE 1 TABLET(100 MG) BY MOUTH DAILY 90 tablet 3  . metoprolol succinate (TOPROL-XL) 50 MG 24 hr tablet Take 1 tablet (50 mg total) by mouth daily. Take with or immediately following a meal. 90 tablet 3  . pantoprazole (PROTONIX) 40 MG tablet Take 1 tablet (40 mg total) by mouth daily. 90 tablet 3  . torsemide (DEMADEX) 20 MG tablet Take 20 mg by mouth daily.    Marland Kitchen trimethoprim (TRIMPEX) 100 MG tablet Take 100 mg by mouth at bedtime.     No current facility-administered medications on file prior to visit.     Observations/Objective: Alert,  NAD, appropriate mood and affect, resps normal, cn 2-12 intact, moves all 4s, no visible rash or swelling Lab Results  Component Value Date   WBC 6.3 02/17/2019   HGB 10.6 (L) 02/17/2019   HCT 32.2 (L) 02/17/2019   PLT 222.0 02/17/2019   GLUCOSE 200 (H) 02/17/2019   CHOL 150 06/15/2018   TRIG 77.0 06/15/2018   HDL 50.00 06/15/2018   LDLDIRECT 156.6 01/07/2011   LDLCALC 85 06/15/2018   ALT 10 02/17/2019   AST 9 02/17/2019   NA 136 02/17/2019   K 5.0 02/17/2019   CL 103 02/17/2019   CREATININE 1.65 (H) 02/17/2019   BUN 19 02/17/2019   CO2 25 02/17/2019   TSH 0.49 02/08/2019   INR 1.21 08/07/2017   HGBA1C 7.6 (H) 02/08/2019   MICROALBUR 2.0 (H) 12/31/2017   Assessment and Plan: See notes  Follow Up Instructions: See notes   I discussed the assessment and treatment plan with the patient. The patient was provided an opportunity to ask questions and all were answered. The patient agreed with the plan and demonstrated an understanding of the instructions.   The patient was advised to call back or seek an in-person evaluation if the symptoms worsen or if the condition fails to improve as anticipated.   Cathlean Cower, MD

## 2019-03-23 NOTE — Assessment & Plan Note (Signed)
stable overall by history and exam, recent data reviewed with pt, and pt to continue medical treatment as before,  to f/u any worsening symptoms or concerns, to cont diet, declines statin

## 2019-03-23 NOTE — Assessment & Plan Note (Signed)
stable overall by history and exam, recent data reviewed with pt, and pt to continue medical treatment as before,  to f/u any worsening symptoms or concerns, labs to be forwarded to renalDr Joelyn Oms per pt request

## 2019-03-23 NOTE — Patient Instructions (Signed)
Please continue all other medications as before, and refills have been done if requested.  Please have the pharmacy call with any other refills you may need.  Please continue your efforts at being more active, low cholesterol diet, and weight control.  Please keep your appointments with your specialists as you may have planned     

## 2019-03-23 NOTE — Assessment & Plan Note (Signed)
stable overall by history and exam, recent data reviewed with pt, and pt to continue medical treatment as before,  to f/u any worsening symptoms or concerns, declines any change in med tx for now

## 2019-03-31 DIAGNOSIS — E113312 Type 2 diabetes mellitus with moderate nonproliferative diabetic retinopathy with macular edema, left eye: Secondary | ICD-10-CM | POA: Diagnosis not present

## 2019-04-05 ENCOUNTER — Other Ambulatory Visit: Payer: Self-pay | Admitting: Endocrinology

## 2019-04-13 ENCOUNTER — Telehealth: Payer: Self-pay | Admitting: Endocrinology

## 2019-04-13 ENCOUNTER — Other Ambulatory Visit: Payer: Self-pay | Admitting: Endocrinology

## 2019-04-13 NOTE — Telephone Encounter (Signed)
Patient called to advise that every time she eats she is falling asleep.  Feels like her blood sugars are ok, but is concerned.  Please return call and advise, [phone]

## 2019-04-15 NOTE — Telephone Encounter (Signed)
Called and left detailed message for pt with MD message.

## 2019-04-15 NOTE — Telephone Encounter (Signed)
Patient called stating they did not receive a call back.  Please Advise, Thanks

## 2019-04-15 NOTE — Telephone Encounter (Signed)
If her blood sugar is okay at that time we may need to check her thyroid levels.  She can come in to get her thyroid labs done

## 2019-04-20 ENCOUNTER — Other Ambulatory Visit: Payer: Medicare Other

## 2019-04-20 ENCOUNTER — Other Ambulatory Visit: Payer: Self-pay | Admitting: Endocrinology

## 2019-04-20 DIAGNOSIS — E890 Postprocedural hypothyroidism: Secondary | ICD-10-CM

## 2019-04-20 DIAGNOSIS — E1165 Type 2 diabetes mellitus with hyperglycemia: Secondary | ICD-10-CM

## 2019-04-22 ENCOUNTER — Other Ambulatory Visit: Payer: Self-pay

## 2019-04-22 ENCOUNTER — Other Ambulatory Visit (INDEPENDENT_AMBULATORY_CARE_PROVIDER_SITE_OTHER): Payer: Medicare Other

## 2019-04-22 DIAGNOSIS — Z794 Long term (current) use of insulin: Secondary | ICD-10-CM

## 2019-04-22 DIAGNOSIS — E890 Postprocedural hypothyroidism: Secondary | ICD-10-CM

## 2019-04-22 DIAGNOSIS — E1165 Type 2 diabetes mellitus with hyperglycemia: Secondary | ICD-10-CM

## 2019-04-23 ENCOUNTER — Telehealth: Payer: Self-pay | Admitting: Internal Medicine

## 2019-04-23 LAB — BASIC METABOLIC PANEL
BUN: 23 mg/dL (ref 6–23)
CO2: 26 mEq/L (ref 19–32)
Calcium: 9.2 mg/dL (ref 8.4–10.5)
Chloride: 109 mEq/L (ref 96–112)
Creatinine, Ser: 1.74 mg/dL — ABNORMAL HIGH (ref 0.40–1.20)
GFR: 35.09 mL/min — ABNORMAL LOW (ref >60.00)
Glucose, Bld: 143 mg/dL — ABNORMAL HIGH (ref 70–99)
Potassium: 5.5 mEq/L — ABNORMAL HIGH (ref 3.5–5.1)
Sodium: 140 mEq/L (ref 135–145)

## 2019-04-23 LAB — T4, FREE: Free T4: 0.86 ng/dL (ref 0.60–1.60)

## 2019-04-23 LAB — MICROALBUMIN / CREATININE URINE RATIO
Creatinine,U: 88.8 mg/dL
Microalb Creat Ratio: 0.8 mg/g (ref 0.0–30.0)
Microalb, Ur: <0.7 mg/dL (ref 0.0–1.9)

## 2019-04-23 LAB — TSH: TSH: 0.58 u[IU]/mL (ref 0.35–4.50)

## 2019-04-23 NOTE — Telephone Encounter (Signed)
Spoke with [name]. She stated she will give office a call in a few weeks to schedule her wellness visit. SF

## 2019-04-24 ENCOUNTER — Other Ambulatory Visit: Payer: Self-pay | Admitting: Internal Medicine

## 2019-04-24 ENCOUNTER — Encounter: Payer: Self-pay | Admitting: Internal Medicine

## 2019-04-24 DIAGNOSIS — E875 Hyperkalemia: Secondary | ICD-10-CM

## 2019-04-26 ENCOUNTER — Telehealth: Payer: Self-pay

## 2019-04-26 ENCOUNTER — Other Ambulatory Visit (INDEPENDENT_AMBULATORY_CARE_PROVIDER_SITE_OTHER): Payer: Medicare Other

## 2019-04-26 DIAGNOSIS — E875 Hyperkalemia: Secondary | ICD-10-CM | POA: Diagnosis not present

## 2019-04-26 LAB — BASIC METABOLIC PANEL
BUN: 18 mg/dL (ref 6–23)
CO2: 23 mEq/L (ref 19–32)
Calcium: 9.1 mg/dL (ref 8.4–10.5)
Chloride: 106 mEq/L (ref 96–112)
Creatinine, Ser: 1.6 mg/dL — ABNORMAL HIGH (ref 0.40–1.20)
GFR: 38.65 mL/min — ABNORMAL LOW (ref >60.00)
Glucose, Bld: 185 mg/dL — ABNORMAL HIGH (ref 70–99)
Potassium: 4.6 mEq/L (ref 3.5–5.1)
Sodium: 138 mEq/L (ref 135–145)

## 2019-04-26 NOTE — Telephone Encounter (Signed)
-----   Message from Biagio Borg, MD sent at 04/24/2019  2:04 PM EDT ----- Letter sent, cont same tx except  The test results show that your current treatment is OK, except Dr Dwyane Dee has informed me of your mild elevated potassium.  Please come to the [facility] site lab for a repeat blood testing.  You may need further treatment if the potassium is still elevated.Redmond Baseman to please inform pt, I will do lab order

## 2019-04-26 NOTE — Telephone Encounter (Signed)
Pt has been informed of results and expressed understanding.  °

## 2019-04-27 ENCOUNTER — Telehealth: Payer: Self-pay

## 2019-04-27 NOTE — Telephone Encounter (Signed)
Pt has been informed of results and expressed understanding.  °

## 2019-04-27 NOTE — Telephone Encounter (Signed)
-----   Message from Biagio Borg, MD sent at 04/26/2019  8:52 PM EDT ----- [place] Mullett to let pt know -   Repeat K is normal,  No change needed in tx.

## 2019-05-02 ENCOUNTER — Ambulatory Visit (HOSPITAL_COMMUNITY)
Admission: EM | Admit: 2019-05-02 | Discharge: 2019-05-02 | Disposition: A | Discharge status: Home / Self Care | Payer: Medicare Other | Attending: Family Medicine | Admitting: Family Medicine

## 2019-05-02 ENCOUNTER — Other Ambulatory Visit: Payer: Self-pay

## 2019-05-02 DIAGNOSIS — N3010 Interstitial cystitis (chronic) without hematuria: Secondary | ICD-10-CM | POA: Insufficient documentation

## 2019-05-02 DIAGNOSIS — B9689 Other specified bacterial agents as the cause of diseases classified elsewhere: Secondary | ICD-10-CM

## 2019-05-02 LAB — POCT URINALYSIS DIP (DEVICE)
Glucose, UA: NEGATIVE mg/dL
Hgb urine dipstick: NEGATIVE
Ketones, ur: NEGATIVE mg/dL
Leukocytes,Ua: NEGATIVE
Nitrite: NEGATIVE
Protein, ur: 30 mg/dL — AB
Specific Gravity, Urine: 1.025 (ref 1.005–1.030)
Urobilinogen, UA: 4 mg/dL — ABNORMAL HIGH (ref 0.0–1.0)
pH: 6.5 (ref 5.0–8.0)

## 2019-05-02 MED ORDER — NITROFURANTOIN MONOHYD MACRO 100 MG PO CAPS: 100.0000 mg | ORAL_CAPSULE | Freq: Two times a day (BID) | ORAL | 0 refills | Status: DC

## 2019-05-02 MED ORDER — TRAMADOL HCL 50 MG PO TABS: 50.0000 mg | ORAL_TABLET | Freq: Four times a day (QID) | ORAL | 0 refills | Status: DC | PRN

## 2019-05-02 MED ORDER — CEFTRIAXONE SODIUM 250 MG IJ SOLR
250.0000 mg | Freq: Once | INTRAMUSCULAR | Status: AC
  Administered 2019-05-02: 250 mg via INTRAMUSCULAR

## 2019-05-02 MED ORDER — CEFTRIAXONE SODIUM 250 MG IJ SOLR
INTRAMUSCULAR | Status: AC
  Filled 2019-05-02: qty 250

## 2019-05-02 MED ORDER — LIDOCAINE HCL (PF) 1 % IJ SOLN
INTRAMUSCULAR | Status: AC
  Filled 2019-05-02: qty 2

## 2019-05-02 NOTE — Discharge Instructions (Addendum)
Follow-up with PCP if symptoms worsen or do not improve.  Keep follow-up already scheduled with [facility].  Symptoms worsen please return for follow-up.

## 2019-05-02 NOTE — ED Triage Notes (Signed)
Per pt she has been having urination frequency and painful urination symptoms since Friday. Pt also having abdominal pain. No blood in urine.

## 2019-05-02 NOTE — ED Provider Notes (Signed)
[place]    CSN: [id number] Arrival date & time: 05/02/19  1348      History   Chief Complaint Chief Complaint  Patient presents with  . Urinary Tract Infection  . Urinary Urgency    HPI [name] is a 68 y.o. female.   HPI  Patient with a history of chronic interstitial cystitis presents today with dysuria and lower abdominal pain. Symptoms have been present for over 1 week. Denies any changes in back pain as patient suffers from chronic back pain. She also suffers from chronic abdominal pain although notes pain today is localized to lower left pelvic region. She was last treated for UTI in May and feels that infection completely resolved. She is prescribed Trimethoprim chronically for preventive treatment of chronic UTI. She is followed by [facility] and is scheduled for an appointment this week. Denies fever, chills, weakness, or shortness of breath. Past Medical History:  Diagnosis Date  . Anxiety   . Arthritis   . Blood dyscrasia    "FREE BLEEDER"  . CERVICAL RADICULOPATHY, LEFT   . Chronic back pain   . CKD (chronic kidney disease)    DR. SANFORD  [facility]  . COMMON MIGRAINE   . CORONARY ARTERY DISEASE   . Cough    CURRENT COLD  . Cystitis   . DEPRESSION   . Diabetes mellitus, type II ([place])   . DIVERTICULOSIS-COLON   . Dysrhythmia    palpitations  . Gastric ulcer 04/2008  . Gastroparesis   . GERD (gastroesophageal reflux disease)   . Hiatal hernia   . Hyperlipidemia   . Hypertension   . Hypothyroidism   . INSOMNIA-SLEEP DISORDER-UNSPEC   . Iron deficiency anemia   . Wears glasses     Patient Active Problem List   Diagnosis Date Noted  . Nausea and vomiting 02/17/2019  . Nausea & vomiting 02/08/2019  . Abdominal pain 02/08/2019  . Constipation 02/08/2019  . UTI (urinary tract infection) 02/08/2019  . Upper abdominal pain 01/21/2019  . Cervical radiculopathy at C6 01/28/2018  . Left lateral epicondylitis 01/23/2018  .  Obesity 01/23/2018  . Weakness 05/01/2017  . Hypothyroidism 05/01/2017  . Hyperkalemia 05/01/2017  . Acute renal failure superimposed on stage 4 chronic kidney disease ([place]) 05/01/2017  . Hypotension 05/01/2017  . Sepsis ([place]) 05/01/2017  . Dysuria 04/23/2017  . Right leg swelling 12/27/2016  . Leg pain, lateral, right 11/05/2016  . Radiculopathy 04/03/2016  . Hypersomnolence 11/23/2015  . Right sided abdominal pain 07/25/2015  . Malaise and fatigue 11/05/2014  . Fracture of fifth metacarpal bone of right hand 07/08/2014  . Right cervical radiculopathy 06/27/2014  . Chronic interstitial cystitis 05/23/2014  . Osteoarthritis of [place] joint of thumb 04/11/2014  . De Quervain's tenosynovitis, left 03/14/2014  . CKD (chronic kidney disease) stage 4, GFR 15-29 ml/min ([facility]) 03/03/2014  . Lower back pain 03/03/2014  . Ingrown nail 03/03/2014  . Peripheral edema 02/23/2014  . Lateral epicondylitis of right elbow 01/31/2014  . Neck pain on left side 11/19/2013  . Abdominal discomfort 10/12/2012  . Right lumbar radiculopathy 05/11/2012  . Peripheral neuropathy ([place]) 05/11/2012  . Vertigo 03/29/2012  . Headache(784.0) 03/10/2012  . Lumbar radiculopathy 10/24/2011  . 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 Right leg weakness 04/04/2011  . Hematochezia 01/07/2011  . Preventative health care 01/07/2011  . CHOLELITHIASIS 05/04/2009  . GERD 04/26/2009  . Gastroparesis 04/26/2009  . ULCER-GASTRIC 03/15/2009  . DIVERTICULOSIS-COLON 03/15/2009  . Cervical radiculopathy 10/28/2008  .  MENOPAUSAL DISORDER 06/29/2008  . Pain in joint, shoulder region 10/12/2007  . INSOMNIA-SLEEP DISORDER-UNSPEC 09/01/2007  . Depression 09/01/2007  . ANEMIA-NOS 08/13/2007  . COMMON MIGRAINE 08/13/2007  . 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 Diabetes ([place]) 05/06/2007  . HLD (hyperlipidemia) 05/06/2007  . ANXIETY 05/06/2007  . Essential hypertension 05/06/2007  . Coronary atherosclerosis 05/06/2007     Past Surgical History:  Procedure Laterality Date  . ABDOMINAL HYSTERECTOMY    . ANTERIOR LAT LUMBAR FUSION Left 08/13/2017   Procedure: LEFT SIDED LUMBAR 3-4 LATERAL INTERBODY FUSION WITH INSTRUMENTATION AND ALLOGRAFT;  Surgeon: Phylliss Bob, MD;  Location: [place];  Service: Orthopedics;  Laterality: Left;  LEFT SIDED LUMBAR 3-4 LATERAL INTERBODY FUSION WITH INSTRUMENTATION AND ALLOGRAFT; REQUEST 3 HOURS  . Back Stimulator  07/2018  . BACK SURGERY  03/2016  . BREAST EXCISIONAL BIOPSY Right    40 years ago  . CHOLECYSTECTOMY  06/2009  . COLONOSCOPY    . ESOPHAGOGASTRODUODENOSCOPY    . EYE SURGERY Bilateral    lasik  . Gastric Wedge resection lipoma  11/2007   x2 with laparotomy and gastrostomy  . JOINT REPLACEMENT    . Left knee replacement    . Rigth knee replacement with revision  04/2008   Dr. Berenice Primas  . ROTATOR CUFF REPAIR Left 01/2009  . s/p bladder surgury  09/2009   Dr. Terance Hart  . SPINAL CORD STIMULATOR INSERTION N/A 09/11/2018   Procedure: LUMBAR SPINAL CORD STIMULATOR INSERTION;  Surgeon: Clydell Hakim, MD;  Location: [place];  Service: Neurosurgery;  Laterality: N/A;  LUMBAR SPINAL CORD STIMULATOR INSERTION    OB History    Gravida  3   Para  3   Term      Preterm      AB      Living  3     SAB      TAB      Ectopic      Multiple      Live Births               Home Medications    Prior to Admission medications   Medication Sig Start Date End Date Taking? Authorizing Provider  aspirin 81 MG EC tablet Take 81 mg by mouth every evening.     [provider]  atorvastatin (LIPITOR) 40 MG tablet Take 1 tablet (40 mg total) by mouth at bedtime. 01/21/19   Biagio Borg, MD  buPROPion (WELLBUTRIN XL) 150 MG 24 hr tablet Take 1 tablet (150 mg total) by mouth daily. 01/21/19   Biagio Borg, MD  cloNIDine (CATAPRES - DOSED IN MG/24 HR) 0.1 mg/24hr patch Place 1 patch (0.1 mg total) onto the skin once a week. 02/08/19   Biagio Borg, MD  cloNIDine  (CATAPRES) 0.1 MG tablet TAKE ONE TABLET BY MOUTH TWICE A DAY 04/06/19   Elayne Snare, MD  Continuous Blood Gluc Receiver (FREESTYLE LIBRE 14 DAY READER) DEVI by Does not apply route.    [provider]  ELMIRON 100 MG capsule Take 200 mg by mouth 2 (two) times daily.  07/20/13   [provider]  estradiol (CLIMARA - DOSED IN MG/24 HR) 0.1 mg/24hr patch Place 1 patch (0.1 mg total) onto the skin once a week. 07/31/17   [name], MD  FLUoxetine (PROZAC) 20 MG capsule TAKE 1 CAPSULE BY MOUTH DAILY 01/21/19   Biagio Borg, MD  gabapentin (NEURONTIN) 100 MG capsule Take 1 capsule (100 mg total) by mouth 3 (  three) times daily. 01/21/19   Biagio Borg, MD  glucose blood (FREESTYLE TEST STRIPS) test strip Use as instructed to check blood sugar 4 times daily. 04/20/18   Elayne Snare, MD  insulin aspart (NOVOLOG FLEXPEN) 100 UNIT/ML FlexPen 5 to 7 units before meals 11/11/18   Elayne Snare, MD  Insulin Glargine-Lixisenatide [facility]) 100-33 UNT-MCG/ML SOPN Inject 26 Units into the skin daily with breakfast. 07/09/18   Elayne Snare, MD  levETIRAcetam (KEPPRA) 250 MG tablet Take 500 mg by mouth 2 (two) times daily.  04/24/17   [provider]  levothyroxine (SYNTHROID) 125 MCG tablet 1 tablet daily and extra half tablet on Sundays 01/21/19   Biagio Borg, MD  linaclotide [facility]) 72 MCG capsule Take 1 capsule (72 mcg total) by mouth daily before breakfast. 01/25/19   Ladene Artist, MD  losartan (COZAAR) 100 MG tablet TAKE 1 TABLET(100 MG) BY MOUTH DAILY 01/21/19   Biagio Borg, MD  metoprolol succinate (TOPROL-XL) 50 MG 24 hr tablet TAKE 1 TABLET BY MOUTH DAILY WITH OR IMMEDIATELY FOLLOWING A MEAL 04/13/19   Elayne Snare, MD  nitrofurantoin, macrocrystal-monohydrate, (MACROBID) 100 MG capsule Take 1 capsule (100 mg total) by mouth 2 (two) times daily. 05/02/19   Scot Jun, FNP  pantoprazole (PROTONIX) 40 MG tablet Take 1 tablet (40 mg total) by mouth daily. 01/25/19 01/25/20   Ladene Artist, MD  promethazine (PHENERGAN) 25 MG tablet Take 1 tablet (25 mg total) by mouth every 8 (eight) hours as needed for nausea or vomiting. 03/23/19   Biagio Borg, MD  torsemide (DEMADEX) 20 MG tablet Take 20 mg by mouth daily.    [provider]  trimethoprim (TRIMPEX) 100 MG tablet Take 100 mg by mouth at bedtime.    [provider]    Family History Family History  Problem Relation Age of Onset  . Diabetes Sister        x 3  . Heart disease Sister        x2  . Breast cancer Sister 70  . Diabetes Brother        x3  . Heart disease Brother        x2  . Coronary artery disease Other        female 1st degree  . Hypertension Other   . Breast cancer Sister 35  . Breast cancer Other 25  . Diabetes Brother   . Colon cancer Neg Hx     Social History Social History   Tobacco Use  . Smoking status: Former Research scientist (life sciences)  . Smokeless tobacco: Never Used  . Tobacco comment: quit 30 years ago  Substance Use Topics  . Alcohol use: No    Alcohol/week: 0.0 standard drinks  . Drug use: No     Allergies   Ciprofloxacin, Hydrocodone, and Penicillins   Review of Systems Review of Systems Pertinent negatives listed in HPI Physical Exam Triage Vital Signs ED Triage Vitals  Enc Vitals Group     BP 05/02/19 1503 (!) 172/82     Pulse Rate 05/02/19 1503 65     Resp 05/02/19 1503 16     Temp 05/02/19 1503 97.9 F (36.6 C)     Temp Source 05/02/19 1503 Tympanic     SpO2 05/02/19 1503 100 %     Weight --      Height --      Head Circumference --      Peak Flow --  Pain Score 05/02/19 1502 7     Pain Loc --      Pain Edu? --      Excl. in [place]? --    No data found.  Updated Vital Signs BP (!) 172/82 (BP Location: Right Arm)   Pulse 65   Temp 97.9 F (36.6 C) (Tympanic)   Resp 16   SpO2 100%   Visual Acuity Right Eye Distance:   Left Eye Distance:   Bilateral Distance:    Right Eye Near:   Left Eye Near:    Bilateral Near:     Physical  Exam General appearance: alert, well developed, well nourished, cooperative and in no distress Head: Normocephalic, without obvious abnormality, atraumatic Respiratory: Respirations even and unlabored, normal respiratory rate Heart: rate and rhythm normal. No gallop or murmurs noted on exam  Abdomen: BS +, no distention, lower LLQ tenderness. No guarding or mass. Extremities: No gross deformities Skin: Skin color, texture, turgor normal. No rashes seen  Psych: Appropriate mood and affect. Neurologic: Mental status: Alert, oriented to person, place, and time, thought content appropriate. UC Treatments / Results  Labs (all labs ordered are listed, but only abnormal results are displayed) Labs Reviewed  POCT URINALYSIS DIP (DEVICE) - Abnormal; Notable for the following components:      Result Value   Bilirubin Urine SMALL (*)    Protein, ur 30 (*)    Urobilinogen, UA 4.0 (*)    All other components within normal limits  URINE CULTURE    EKG   Radiology No results found.  Procedures Procedures (including critical care time)  Medications Ordered in UC Medications  cefTRIAXone (ROCEPHIN) injection 250 mg (has no administration in time range)  cefTRIAXone (ROCEPHIN) 250 MG injection (has no administration in time range)  lidocaine (PF) (XYLOCAINE) 1 % injection (has no administration in time range)    Initial Impression / Assessment and Plan / UC Course  I have reviewed the triage vital signs and the nursing notes.  Pertinent labs & imaging results that were available during my care of the patient were reviewed by me and considered in my medical decision making (see chart for details).    Urine culture pending. UA revealed protein no leukocytes present. Given history of chronic interstitial cystitis will treat Macrobid and continue trimethoprim. Keep follow-up this week with urology. Follow-up at the ER if abdominal or urine symptoms worsen prior to follow-up with urologist.   Patient verbalized understanding and agreement with plan. Final Clinical Impressions(s) / UC Diagnoses   Final diagnoses:  Chronic interstitial cystitis    ED Prescriptions    Medication Sig Dispense Auth. Provider   nitrofurantoin, macrocrystal-monohydrate, (MACROBID) 100 MG capsule Take 1 capsule (100 mg total) by mouth 2 (two) times daily. 20 capsule Scot Jun, FNP     Controlled Substance Prescriptions [place] Controlled Substance Registry consulted? Not Applicable   Scot Jun, [place] 05/03/19 1525

## 2019-05-03 LAB — URINE CULTURE: Culture: <10000 — AB

## 2019-05-04 DIAGNOSIS — N3020 Other chronic cystitis without hematuria: Secondary | ICD-10-CM | POA: Diagnosis not present

## 2019-05-04 DIAGNOSIS — R109 Unspecified abdominal pain: Secondary | ICD-10-CM | POA: Diagnosis not present

## 2019-05-05 DIAGNOSIS — N3020 Other chronic cystitis without hematuria: Secondary | ICD-10-CM | POA: Diagnosis not present

## 2019-05-12 ENCOUNTER — Other Ambulatory Visit: Payer: Self-pay

## 2019-05-12 ENCOUNTER — Ambulatory Visit (INDEPENDENT_AMBULATORY_CARE_PROVIDER_SITE_OTHER): Payer: Medicare Other | Admitting: Endocrinology

## 2019-05-12 ENCOUNTER — Other Ambulatory Visit: Payer: Self-pay | Admitting: Endocrinology

## 2019-05-12 ENCOUNTER — Encounter: Payer: Self-pay | Admitting: Endocrinology

## 2019-05-12 VITALS — BP 128/78 | HR 73 | Ht 65.0 in | Wt 288.2 lb

## 2019-05-12 DIAGNOSIS — E1165 Type 2 diabetes mellitus with hyperglycemia: Secondary | ICD-10-CM

## 2019-05-12 DIAGNOSIS — I10 Essential (primary) hypertension: Secondary | ICD-10-CM

## 2019-05-12 DIAGNOSIS — N184 Chronic kidney disease, stage 4 (severe): Secondary | ICD-10-CM | POA: Diagnosis not present

## 2019-05-12 DIAGNOSIS — Z794 Long term (current) use of insulin: Secondary | ICD-10-CM | POA: Diagnosis not present

## 2019-05-12 DIAGNOSIS — E890 Postprocedural hypothyroidism: Secondary | ICD-10-CM

## 2019-05-12 LAB — POCT GLYCOSYLATED HEMOGLOBIN (HGB A1C): Hemoglobin A1C: 7 % — AB (ref 4.0–5.6)

## 2019-05-12 NOTE — Patient Instructions (Addendum)
Novolog 10-15 units at meals  Cut fatty meals  Soliqua 22 daily  Thyroid: 1 1/2 pills 5 days and 1 pill on Saturdays

## 2019-05-12 NOTE — Progress Notes (Signed)
[name] is a 68 y.o. female.    Reason for Appointment: Diabetes follow-up   History of Present Illness   Diagnosis: Type 2 DIABETES MELITUS, date of diagnosis: 2000  Previous history: She had initially been treated with metformin and Amaryl and subsequently given Victoza which helped overall control and weight loss. In 9/13 because of significant hyperglycemia she was started on basal bolus insulin also. Had required relatively small doses of insulin. Her blood sugar control has been excellent with A1c between 5.3-6.0 although this may be relatively higher than expected. Tends to have relatively higher readings after supper In 6/15 she was told to resume her Victoza and stop her Lantus since she had been gaining weight with Lantus and NovoLog regimen.   Recent history:     INSULIN regimen: Novolog 7 to go to 10 units before meals     Non-insulin hypoglycemic drugs: Soliqua 26 units daily   Her A1c is 7, previously 7.6   Current blood sugar patterns and management:  Her blood sugars are higher than expected for her A1c  As before she is not checking her sugars consistently even with the freestyle libre as she feels it is falsely low at times and less than 50% data is available for the last 2 weeks  As before she has significant POSTPRANDIAL hyperglycemia with blood sugars rising significantly after 9 AM and continuing to be high the rest of the evening  She thinks she is eating small meals and does not take much NovoLog usually  She also thinks that if she takes more NovoLog at suppertime she may get low sugars during the night  She was supposed to reduce her Soliqua down to 22 she forgot and she is still taking 26 units  She is taking this in the mornings as prescribed  No hypoglycemia currently.  As before she continues to have difficulty with her weight and has recently gained more weight    Side effects from medications:  none  Monitors blood  glucose: Once a day or less on average .    Glucometer:  Freestyle Libre/Accu-Chek .         CGM use % of time  46  2-week average/SD  174, GV 32  Time in range      59 % was 52  % Time Above 180  31  % Time above 250  9  % Time Below 70  1     PRE-MEAL Fasting Lunch Dinner Bedtime Overall  Glucose range:       Averages:  123   217     POST-MEAL PC Breakfast PC Lunch PC Dinner  Glucose range:     Averages:  183  229  215    Previous data:   CGM use % of time  60  Average and SD  150 +/-41, was 120  Time in range  52     %  % Time Above 180  29  % Time above 250  5  % Time Below target  14    PRE-MEAL Fasting Lunch Dinner Bedtime Overall  Glucose range:       Mean/median:  106   193  221  150   POST-MEAL PC Breakfast PC Lunch PC Dinner  Glucose range:     Mean/median:   168  204    Meals: 1-2 meals per day. Lunch 12 Supper at 5 PM  Physical activity: exercise: Limited, has had fatigue, back and leg pain.            Dietician visit: Most recent: 02/4626            Complications: are: None  Wt Readings from Last 3 Encounters:  05/12/19 288 lb 3.2 oz (130.7 kg)  02/17/19 286 lb (129.7 kg)  02/08/19 278 lb (126.1 kg)    LABS:  Lab Results  Component Value Date   HGBA1C 7.0 (A) 05/12/2019   HGBA1C 7.6 (H) 02/08/2019   HGBA1C 7.2 (A) 02/08/2019   Lab Results  Component Value Date   MICROALBUR <0.7 04/22/2019   [place] 85 06/15/2018   CREATININE 1.60 (H) 04/26/2019    Other active problems: See review of systems   Office Visit on 05/12/2019  Component Date Value Ref Range Status  . Hemoglobin A1C 05/12/2019 7.0* 4.0 - 5.6 % Final    Allergies as of 05/12/2019      Reactions   Ciprofloxacin Shortness Of Breath, Other (See Comments)   Dizziness   Hydrocodone Shortness Of Breath, Other (See Comments)   Tachycardia   Penicillins Hives   Pt just remembers hives Has patient had a PCN reaction causing immediate rash, facial/tongue/throat  swelling, SOB or lightheadedness with hypotension: No Has patient had a PCN reaction causing severe rash involving mucus membranes or skin necrosis: No Has patient had a PCN reaction that required hospitalization No Has patient had a PCN reaction occurring within the last 10 years: No If all of the above answers are "NO", then may proceed with Cephalosporin use.      Medication List       Accurate as of May 12, 2019 11:59 PM. If you have any questions, ask your nurse or doctor.        STOP taking these medications   cloNIDine 0.1 mg/24hr patch Commonly known as: CATAPRES - Dosed in mg/24 hr Stopped by: Elayne Snare, MD   estradiol 0.1 mg/24hr patch Commonly known as: CLIMARA - Dosed in mg/24 hr Stopped by: Elayne Snare, MD   levETIRAcetam 250 MG tablet Commonly known as: KEPPRA Stopped by: Elayne Snare, MD   pantoprazole 40 MG tablet Commonly known as: Protonix Stopped by: Elayne Snare, MD   torsemide 20 MG tablet Commonly known as: [place] by: Elayne Snare, MD     TAKE these medications   aspirin 81 MG EC tablet Take 81 mg by mouth every evening.   atorvastatin 40 MG tablet Commonly known as: LIPITOR Take 1 tablet (40 mg total) by mouth at bedtime.   buPROPion 150 MG 24 hr tablet Commonly known as: Wellbutrin XL Take 1 tablet (150 mg total) by mouth daily.   cloNIDine 0.1 MG tablet Commonly known as: CATAPRES TAKE ONE TABLET BY MOUTH TWICE A DAY   Elmiron 100 MG capsule Generic drug: pentosan polysulfate Take 200 mg by mouth 2 (two) times daily.   FLUoxetine 20 MG capsule Commonly known as: PROZAC TAKE 1 CAPSULE BY MOUTH DAILY   FreeStyle Libre 14 Day Reader Kerrin Mo by Does not apply route.   gabapentin 100 MG capsule Commonly known as: NEURONTIN Take 1 capsule (100 mg total) by mouth 3 (three) times daily.   glucose blood test strip Commonly known as: FREESTYLE TEST STRIPS Use as instructed to check blood sugar 4 times daily.   insulin aspart 100  UNIT/ML FlexPen Commonly known as: NovoLOG FlexPen 5 to 7 units before meals   Insulin Glargine-Lixisenatide 100-33 UNT-MCG/ML Sopn Commonly  known as: Soliqua Inject 26 Units into the skin daily with breakfast.   levothyroxine 125 MCG tablet Commonly known as: SYNTHROID 1 tablet daily and extra half tablet on Sundays   linaclotide 72 MCG capsule Commonly known as: Linzess Take 1 capsule (72 mcg total) by mouth daily before breakfast.   losartan 100 MG tablet Commonly known as: COZAAR TAKE 1 TABLET(100 MG) BY MOUTH DAILY   metoprolol succinate 50 MG 24 hr tablet Commonly known as: TOPROL-XL TAKE 1 TABLET BY MOUTH DAILY WITH OR IMMEDIATELY FOLLOWING A MEAL   nitrofurantoin (macrocrystal-monohydrate) 100 MG capsule Commonly known as: Macrobid Take 1 capsule (100 mg total) by mouth 2 (two) times daily.   promethazine 25 MG tablet Commonly known as: PHENERGAN Take 1 tablet (25 mg total) by mouth every 8 (eight) hours as needed for nausea or vomiting.   traMADol 50 MG tablet Commonly known as: ULTRAM Take 1 tablet (50 mg total) by mouth every 6 (six) hours as needed.   trimethoprim 100 MG tablet Commonly known as: TRIMPEX Take 100 mg by mouth at bedtime.       Allergies:  Allergies  Allergen Reactions  . Ciprofloxacin Shortness Of Breath and Other (See Comments)    Dizziness   . Hydrocodone Shortness Of Breath and Other (See Comments)    Tachycardia  . Penicillins Hives    Pt just remembers hives Has patient had a PCN reaction causing immediate rash, facial/tongue/throat swelling, SOB or lightheadedness with hypotension: No Has patient had a PCN reaction causing severe rash involving mucus membranes or skin necrosis: No Has patient had a PCN reaction that required hospitalization No Has patient had a PCN reaction occurring within the last 10 years: No If all of the above answers are "NO", then may proceed with Cephalosporin use.     Past Medical History:   Diagnosis Date  . Anxiety   . Arthritis   . Blood dyscrasia    "FREE BLEEDER"  . CERVICAL RADICULOPATHY, LEFT   . Chronic back pain   . CKD (chronic kidney disease)    DR. SANFORD  [facility]  . COMMON MIGRAINE   . CORONARY ARTERY DISEASE   . Cough    CURRENT COLD  . Cystitis   . DEPRESSION   . Diabetes mellitus, type II ([place])   . DIVERTICULOSIS-COLON   . Dysrhythmia    palpitations  . Gastric ulcer 04/2008  . Gastroparesis   . GERD (gastroesophageal reflux disease)   . Hiatal hernia   . Hyperlipidemia   . Hypertension   . Hypothyroidism   . INSOMNIA-SLEEP DISORDER-UNSPEC   . Iron deficiency anemia   . Wears glasses     Past Surgical History:  Procedure Laterality Date  . ABDOMINAL HYSTERECTOMY    . ANTERIOR LAT LUMBAR FUSION Left 08/13/2017   Procedure: LEFT SIDED LUMBAR 3-4 LATERAL INTERBODY FUSION WITH INSTRUMENTATION AND ALLOGRAFT;  Surgeon: Phylliss Bob, MD;  Location: [place];  Service: Orthopedics;  Laterality: Left;  LEFT SIDED LUMBAR 3-4 LATERAL INTERBODY FUSION WITH INSTRUMENTATION AND ALLOGRAFT; REQUEST 3 HOURS  . Back Stimulator  07/2018  . BACK SURGERY  03/2016  . BREAST EXCISIONAL BIOPSY Right    40 years ago  . CHOLECYSTECTOMY  06/2009  . COLONOSCOPY    . ESOPHAGOGASTRODUODENOSCOPY    . EYE SURGERY Bilateral    lasik  . Gastric Wedge resection lipoma  11/2007   x2 with laparotomy and gastrostomy  . JOINT REPLACEMENT    . Left knee replacement    .  Rigth knee replacement with revision  04/2008   Dr. Berenice Primas  . ROTATOR CUFF REPAIR Left 01/2009  . s/p bladder surgury  09/2009   Dr. Terance Hart  . SPINAL CORD STIMULATOR INSERTION N/A 09/11/2018   Procedure: LUMBAR SPINAL CORD STIMULATOR INSERTION;  Surgeon: Clydell Hakim, MD;  Location: [place];  Service: Neurosurgery;  Laterality: N/A;  LUMBAR SPINAL CORD STIMULATOR INSERTION    Family History  Problem Relation Age of Onset  . Diabetes Sister        x 3  . Heart disease Sister        x2  . Breast  cancer Sister 52  . Diabetes Brother        x3  . Heart disease Brother        x2  . Coronary artery disease Other        female 1st degree  . Hypertension Other   . Breast cancer Sister 17  . Breast cancer Other 25  . Diabetes Brother   . Colon cancer Neg Hx     Social History:  reports that she has quit smoking. She has never used smokeless tobacco. She reports that she does not drink alcohol or use drugs.  Review of Systems:  Hypertension: Her blood pressure is treated with losartan 100 mg and clonidine Followed by nephrologist and PCP  CKD: Her creatinine is variable, followed by nephrologist and recently stable No hyperkalemia  Lab Results  Component Value Date   CREATININE 1.60 (H) 04/26/2019   CREATININE 1.74 (H) 04/22/2019   CREATININE 1.65 (H) 02/17/2019   CREATININE 1.63 (H) 02/08/2019   Lab Results  Component Value Date   K 4.6 04/26/2019     Lipids: Followed by PCP, LDL has been controlled  Taking Lipitor 40 mg with results as follows  Lab Results  Component Value Date   CHOL 150 06/15/2018   HDL 50.00 06/15/2018   LDLCALC 85 06/15/2018   LDLDIRECT 156.6 01/07/2011   TRIG 77.0 06/15/2018   CHOLHDL 3 06/15/2018     Post ablative hypothyroidism:  Previously she had I-131 treatment on 08/20/11 for Graves' disease  Her dose has been adjusted periodically Since her TSH was markedly increased in 3/19 her dose was increased up to 125 mcg  However subsequently her TSH has been variable On her last visit she was taking 7-1/2 pills a week of the levothyroxine 125 dose Now for some reason she is confused about her dosage and she says she is taking 1-1/2 pills every day except none on Sundays This was confirmed on repeat questioning  Not clear why she has changed her dose even though her prescription is not stating this  She is taking her levothyroxine in the morning Her TSH is surprisingly not changed and she may have been taking the higher dose in the  past also   Her only complaints are feeling sleepy after meals  Lab Results  Component Value Date   TSH 0.58 04/22/2019   TSH 0.49 02/08/2019   TSH 0.67 02/08/2019   FREET4 0.86 04/22/2019   FREET4 0.82 02/08/2019   FREET4 0.93 11/02/2018    On Gabapentin for neuropathy    Examination:   BP 128/78 (BP Location: Left Arm, Patient Position: Sitting, Cuff Size: Large)   Pulse 73   Ht 5\' 5"  (1.651 m)   Wt 288 lb 3.2 oz (130.7 kg)   SpO2 99%   BMI 47.96 kg/m   Body mass index is 47.96 kg/m.  ASSESSMENT/ PLAN:   Diabetes type 2,  with obesity:   See history of present illness for detailed discussion of current management, blood sugar patterns and problems identified  A1C is relatively stable at 7, previously was at 7.2  However as before her blood sugars are higher than expected for A1c and on her CGM averaging over 200 between 2 PM and midnight Also patient thinks that the freestyle [name] is reading falsely low between 15-30 mg This can be confirmed since her lab glucose was checked over 2 weeks ago and was 185  She appears to have mostly high readings during the daytime including the dawn phenomenon and high postprandial readings regardless of what she is eating Although she is likely a candidate for taking NPH insulin in the morning for control of blood sugars that are rising after about 8 AM will try to work with her current insulin that she has   Recommendations for diabetes:  She will try to take at least 10 to 15 units of NovoLog with every meal especially at dinnertime  Discussed that if she reduces her Soliqua down to 22 units or less she will not have nocturnal hypoglycemia  Balanced meals and avoid high-fat meats like liver pudding that she is eating  Chair exercises  To try and check her sugars 3 times a day consistently every day to enable better idea of what her sugars are  She will bring her Accu-Chek monitor also for review    Hypothyroidism:  As discussed above she is misunderstanding her instructions for the Synthroid and is taking 1-1/2 tablets daily instead of just once a week Surprisingly her thyroid levels are not any higher  For now she will continue the 125 mcg prescription but take 1-1/2 pills during the week and only 1 tablet on Saturdays  Hypertension: Now better controlled and followed by PCP  Daytime somnolence: Etiology is unclear, may be related to her insomnia and depression as well as possibly medication related including clonidine, may also be related to her relatively high readings after meals She will discuss with PCP   Counseling time on subjects discussed in assessment and plan sections is over 50% of today's 25 minute visit      Patient Instructions  Novolog 10-15 units at meals  Cut fatty meals  Soliqua 22 daily  Thyroid: 1 1/2 pills 5 days and 1 pill on Saturdays     Counseling time on subjects discussed in assessment and plan sections is over 50% of today's 25 minute visit  Elayne Snare 05/13/2019, 8:40 AM   Note: This office note was prepared with Dragon voice recognition system technology. Any transcriptional errors that result from this process are unintentional.

## 2019-05-20 ENCOUNTER — Telehealth: Payer: Self-pay | Admitting: Endocrinology

## 2019-05-20 NOTE — Telephone Encounter (Signed)
Per [place] states she needs her medication sent in to the patient assist"

## 2019-05-20 NOTE — Telephone Encounter (Signed)
Called [employer] Pt assistance and they stated that they needed a reorder form for the pt's [place]. This is being faxed to the office now. Once it is received, it will be given to provider to sign and then faxed back.

## 2019-05-22 ENCOUNTER — Other Ambulatory Visit: Payer: Self-pay | Admitting: Internal Medicine

## 2019-05-24 ENCOUNTER — Other Ambulatory Visit: Payer: Self-pay | Admitting: Internal Medicine

## 2019-05-25 DIAGNOSIS — N189 Chronic kidney disease, unspecified: Secondary | ICD-10-CM | POA: Diagnosis not present

## 2019-05-25 DIAGNOSIS — M5416 Radiculopathy, lumbar region: Secondary | ICD-10-CM | POA: Diagnosis not present

## 2019-05-25 DIAGNOSIS — E785 Hyperlipidemia, unspecified: Secondary | ICD-10-CM | POA: Diagnosis not present

## 2019-05-25 DIAGNOSIS — E1140 Type 2 diabetes mellitus with diabetic neuropathy, unspecified: Secondary | ICD-10-CM | POA: Diagnosis not present

## 2019-05-25 DIAGNOSIS — I25118 Atherosclerotic heart disease of native coronary artery with other forms of angina pectoris: Secondary | ICD-10-CM | POA: Diagnosis not present

## 2019-05-25 DIAGNOSIS — E1122 Type 2 diabetes mellitus with diabetic chronic kidney disease: Secondary | ICD-10-CM | POA: Diagnosis not present

## 2019-05-25 DIAGNOSIS — G894 Chronic pain syndrome: Secondary | ICD-10-CM | POA: Diagnosis not present

## 2019-05-25 DIAGNOSIS — I10 Essential (primary) hypertension: Secondary | ICD-10-CM | POA: Diagnosis not present

## 2019-05-25 DIAGNOSIS — M1990 Unspecified osteoarthritis, unspecified site: Secondary | ICD-10-CM | POA: Diagnosis not present

## 2019-05-26 NOTE — Telephone Encounter (Signed)
Received shipment from [employer] pt Environmental consultant containing 1 box of [place]. Pt called and notified.

## 2019-05-31 DIAGNOSIS — N183 Chronic kidney disease, stage 3 (moderate): Secondary | ICD-10-CM | POA: Diagnosis not present

## 2019-06-01 ENCOUNTER — Other Ambulatory Visit (INDEPENDENT_AMBULATORY_CARE_PROVIDER_SITE_OTHER): Payer: Medicare Other

## 2019-06-01 ENCOUNTER — Encounter: Payer: Self-pay | Admitting: Internal Medicine

## 2019-06-01 ENCOUNTER — Ambulatory Visit (INDEPENDENT_AMBULATORY_CARE_PROVIDER_SITE_OTHER): Payer: Medicare Other | Admitting: Internal Medicine

## 2019-06-01 ENCOUNTER — Other Ambulatory Visit: Payer: Self-pay

## 2019-06-01 VITALS — BP 136/86 | HR 71 | Temp 98.4°F | Ht 65.0 in | Wt 285.0 lb

## 2019-06-01 DIAGNOSIS — R399 Unspecified symptoms and signs involving the genitourinary system: Secondary | ICD-10-CM

## 2019-06-01 DIAGNOSIS — N12 Tubulo-interstitial nephritis, not specified as acute or chronic: Secondary | ICD-10-CM

## 2019-06-01 DIAGNOSIS — R32 Unspecified urinary incontinence: Secondary | ICD-10-CM

## 2019-06-01 DIAGNOSIS — N390 Urinary tract infection, site not specified: Secondary | ICD-10-CM

## 2019-06-01 LAB — URINALYSIS, ROUTINE W REFLEX MICROSCOPIC
Hgb urine dipstick: NEGATIVE
Ketones, ur: NEGATIVE
Leukocytes,Ua: NEGATIVE
Nitrite: POSITIVE — AB
Specific Gravity, Urine: 1.02 (ref 1.000–1.030)
Total Protein, Urine: 100 — AB
Urine Glucose: 100 — AB
Urobilinogen, UA: >=8 — AB (ref 0.0–1.0)
pH: 6.5 (ref 5.0–8.0)

## 2019-06-01 MED ORDER — PHENAZOPYRIDINE HCL 100 MG PO TABS: 100.0000 mg | ORAL_TABLET | Freq: Three times a day (TID) | ORAL | 0 refills | Status: DC | PRN

## 2019-06-01 MED ORDER — NALTREXONE HCL 50 MG PO TABS: 50.0000 mg | ORAL_TABLET | Freq: Every day | ORAL | 3 refills | Status: DC

## 2019-06-01 MED ORDER — SULFAMETHOXAZOLE-TRIMETHOPRIM 800-160 MG PO TABS: 1.0000 | ORAL_TABLET | Freq: Two times a day (BID) | ORAL | 0 refills | Status: DC

## 2019-06-01 MED ORDER — NITROFURANTOIN MACROCRYSTAL 50 MG PO CAPS: 50.0000 mg | ORAL_CAPSULE | Freq: Every day | ORAL | 1 refills | Status: DC

## 2019-06-01 NOTE — Progress Notes (Signed)
Subjective:    Patient ID: [name], female    DOB: [birth year], 68 y.o.   MRN: [id number]  HPI  Here with 3 days onset dysuria, freq and left flank pain, general weakness and malaie but Denies urinary symptoms such as urgency, hematuria or n/v, fever, chills. Azo and tylenol not working for pain.  Nothing else seems to make better or worse.  This would make another UTI which seems to happen every 2-3 months for a year.  Has ongoing incontinence but has not had chance to f/u with urology yet.  Pt denies chest pain, increased sob or doe, wheezing, orthopnea, PND, increased LE swelling, palpitations, dizziness or syncope.  Pt denies new neurological symptoms such as new headache, or facial or extremity weakness or numbness   Pt denies polydipsia, polyuria.  Also, Asks for refill naltrexone for further wt loss as this did seem to be helping. Wt Readings from Last 3 Encounters:  06/01/19 285 lb (129.3 kg)  05/12/19 288 lb 3.2 oz (130.7 kg)  02/17/19 286 lb (129.7 kg)   Past Medical History:  Diagnosis Date  . Anxiety   . Arthritis   . Blood dyscrasia    "FREE BLEEDER"  . CERVICAL RADICULOPATHY, LEFT   . Chronic back pain   . CKD (chronic kidney disease)    DR. SANFORD  [facility]  . COMMON MIGRAINE   . CORONARY ARTERY DISEASE   . Cough    CURRENT COLD  . Cystitis   . DEPRESSION   . Diabetes mellitus, type II ([place])   . DIVERTICULOSIS-COLON   . Dysrhythmia    palpitations  . Gastric ulcer 04/2008  . Gastroparesis   . GERD (gastroesophageal reflux disease)   . Hiatal hernia   . Hyperlipidemia   . Hypertension   . Hypothyroidism   . INSOMNIA-SLEEP DISORDER-UNSPEC   . Iron deficiency anemia   . Wears glasses    Past Surgical History:  Procedure Laterality Date  . ABDOMINAL HYSTERECTOMY    . ANTERIOR LAT LUMBAR FUSION Left 08/13/2017   Procedure: LEFT SIDED LUMBAR 3-4 LATERAL INTERBODY FUSION WITH INSTRUMENTATION AND ALLOGRAFT;  Surgeon: Phylliss Bob, MD;  Location:  [place];  Service: Orthopedics;  Laterality: Left;  LEFT SIDED LUMBAR 3-4 LATERAL INTERBODY FUSION WITH INSTRUMENTATION AND ALLOGRAFT; REQUEST 3 HOURS  . Back Stimulator  07/2018  . BACK SURGERY  03/2016  . BREAST EXCISIONAL BIOPSY Right    40 years ago  . CHOLECYSTECTOMY  06/2009  . COLONOSCOPY    . ESOPHAGOGASTRODUODENOSCOPY    . EYE SURGERY Bilateral    lasik  . Gastric Wedge resection lipoma  11/2007   x2 with laparotomy and gastrostomy  . JOINT REPLACEMENT    . Left knee replacement    . Rigth knee replacement with revision  04/2008   Dr. Berenice Primas  . ROTATOR CUFF REPAIR Left 01/2009  . s/p bladder surgury  09/2009   Dr. Terance Hart  . SPINAL CORD STIMULATOR INSERTION N/A 09/11/2018   Procedure: LUMBAR SPINAL CORD STIMULATOR INSERTION;  Surgeon: Clydell Hakim, MD;  Location: [facility];  Service: Neurosurgery;  Laterality: N/A;  LUMBAR SPINAL CORD STIMULATOR INSERTION    reports that she has quit smoking. She has never used smokeless tobacco. She reports that she does not drink alcohol or use drugs. family history includes Breast cancer (age of onset: 21) in an other family member; Breast cancer (age of onset: 44) in her sister; Breast cancer (age of onset: 54) in her sister; Coronary  artery disease in an other family member; Diabetes in her brother, brother, and sister; Heart disease in her brother and sister; Hypertension in an other family member. Allergies  Allergen Reactions  . Ciprofloxacin Shortness Of Breath and Other (See Comments)    Dizziness   . Hydrocodone Shortness Of Breath and Other (See Comments)    Tachycardia  . Penicillins Hives    Pt just remembers hives Has patient had a PCN reaction causing immediate rash, facial/tongue/throat swelling, SOB or lightheadedness with hypotension: No Has patient had a PCN reaction causing severe rash involving mucus membranes or skin necrosis: No Has patient had a PCN reaction that required hospitalization No Has patient had a PCN reaction  occurring within the last 10 years: No If all of the above answers are "NO", then may proceed with Cephalosporin use.    Current Outpatient Medications on File Prior to Visit  Medication Sig Dispense Refill  . aspirin 81 MG EC tablet Take 81 mg by mouth every evening.     Marland Kitchen atorvastatin (LIPITOR) 40 MG tablet Take 1 tablet (40 mg total) by mouth at bedtime. 90 tablet 3  . buPROPion (WELLBUTRIN XL) 150 MG 24 hr tablet Take 1 tablet (150 mg total) by mouth daily. 90 tablet 3  . cloNIDine (CATAPRES) 0.1 MG tablet TAKE ONE TABLET BY MOUTH TWICE A DAY 60 tablet 0  . Continuous Blood Gluc Receiver (FREESTYLE LIBRE 14 DAY READER) DEVI by Does not apply route.    Marland Kitchen ELMIRON 100 MG capsule Take 200 mg by mouth 2 (two) times daily.     Marland Kitchen FLUoxetine (PROZAC) 20 MG capsule TAKE 1 CAPSULE BY MOUTH DAILY 90 capsule 3  . gabapentin (NEURONTIN) 100 MG capsule Take 1 capsule (100 mg total) by mouth 3 (three) times daily. 90 capsule 5  . glucose blood (FREESTYLE TEST STRIPS) test strip Use as instructed to check blood sugar 4 times daily. 100 each 3  . insulin aspart (NOVOLOG FLEXPEN) 100 UNIT/ML FlexPen 5 to 7 units before meals 15 pen 2  . Insulin Glargine-Lixisenatide (SOLIQUA) 100-33 UNT-MCG/ML SOPN Inject 26 Units into the skin daily with breakfast. 3 pen 3  . levothyroxine (SYNTHROID) 125 MCG tablet 1 tablet daily and extra half tablet on Sundays 96 tablet 3  . linaclotide (LINZESS) 72 MCG capsule Take 1 capsule (72 mcg total) by mouth daily before breakfast. 30 capsule 11  . losartan (COZAAR) 100 MG tablet TAKE 1 TABLET(100 MG) BY MOUTH DAILY 90 tablet 1  . metoprolol succinate (TOPROL-XL) 50 MG 24 hr tablet TAKE 1 TABLET BY MOUTH DAILY WITH OR IMMEDIATELY FOLLOWING A MEAL 30 tablet 3  . promethazine (PHENERGAN) 25 MG tablet Take 1 tablet (25 mg total) by mouth every 8 (eight) hours as needed for nausea or vomiting. 40 tablet 1  . traMADol (ULTRAM) 50 MG tablet Take 1 tablet (50 mg total) by mouth every 6  (six) hours as needed. 8 tablet 0   No current facility-administered medications on file prior to visit.    Review of Systems  Constitutional: Negative for other unusual diaphoresis or sweats HENT: Negative for ear discharge or swelling Eyes: Negative for other worsening visual disturbances Respiratory: Negative for stridor or other swelling  Gastrointestinal: Negative for worsening distension or other blood Genitourinary: Negative for retention or other urinary change Musculoskeletal: Negative for other MSK pain or swelling Skin: Negative for color change or other new lesions Neurological: Negative for worsening tremors and other numbness  Psychiatric/Behavioral: Negative for worsening agitation  or other fatigue All other system neg per pt    Objective:   Physical Exam BP 136/86   Pulse 71   Temp 98.4 F (36.9 C) (Oral)   Ht 5\' 5"  (1.651 m)   Wt 285 lb (129.3 kg)   SpO2 96%   BMI 47.43 kg/m  VS noted, mild ill, non toxic Constitutional: Pt appears in NAD HENT: Head: NCAT.  Right Ear: External ear normal.  Left Ear: External ear normal.  Eyes: . Pupils are equal, round, and reactive to light. Conjunctivae and EOM are normal Nose: without d/c or deformity Neck: Neck supple. Gross normal ROM Cardiovascular: Normal rate and regular rhythm.   Pulmonary/Chest: Effort normal and breath sounds without rales or wheezing.  Abd:  Soft, ND, + BS, no organomegaly with low mid abd tender, no gaurding or rebound and + left flank tender Neurological: Pt is alert. At baseline orientation, motor grossly intact Skin: Skin is warm. No rashes, other new lesions, no LE edema Psychiatric: Pt behavior is normal without agitation  No other exam findings Lab Results  Component Value Date   WBC 6.3 02/17/2019   HGB 10.6 (L) 02/17/2019   HCT 32.2 (L) 02/17/2019   PLT 222.0 02/17/2019   GLUCOSE 185 (H) 04/26/2019   CHOL 150 06/15/2018   TRIG 77.0 06/15/2018   HDL 50.00 06/15/2018   LDLDIRECT  156.6 01/07/2011   LDLCALC 85 06/15/2018   ALT 10 02/17/2019   AST 9 02/17/2019   NA 138 04/26/2019   K 4.6 04/26/2019   CL 106 04/26/2019   CREATININE 1.60 (H) 04/26/2019   BUN 18 04/26/2019   CO2 23 04/26/2019   TSH 0.58 04/22/2019   INR 1.21 08/07/2017   HGBA1C 7.0 (A) 05/12/2019   MICROALBUR <0.7 04/22/2019       Assessment & Plan:

## 2019-06-01 NOTE — Patient Instructions (Signed)
Please take all new medication as prescribed - the septra DS for 10 days, then take the nitrofurantoin at bedtime after that  OK to stop the trimethoprim  Please remember to follow up with your urologist about the recurrent infections, and incontinence  Please continue all other medications as before, and refills have been done if requested.he naltrexone  Please have the pharmacy call with any other refills you may need.  Please continue your efforts at being more active, low cholesterol diet, and weight control.  Please keep your appointments with your specialists as you may have planned  Your specimen is sent for culture today  You will be contacted by phone if any changes need to be made immediately.  Otherwise, you will receive a letter about your results with an explanation, but please check with MyChart first.  Please remember to sign up for MyChart if you have not done so, as this will be important to you in the future with finding out test results, communicating by private email, and scheduling acute appointments online when needed.

## 2019-06-02 DIAGNOSIS — E113312 Type 2 diabetes mellitus with moderate nonproliferative diabetic retinopathy with macular edema, left eye: Secondary | ICD-10-CM | POA: Diagnosis not present

## 2019-06-03 ENCOUNTER — Encounter: Payer: Self-pay | Admitting: Internal Medicine

## 2019-06-03 LAB — URINE CULTURE
MICRO NUMBER:: 808575
SPECIMEN QUALITY:: ADEQUATE

## 2019-06-05 ENCOUNTER — Encounter: Payer: Self-pay | Admitting: Internal Medicine

## 2019-06-05 DIAGNOSIS — N390 Urinary tract infection, site not specified: Secondary | ICD-10-CM | POA: Insufficient documentation

## 2019-06-05 DIAGNOSIS — N12 Tubulo-interstitial nephritis, not specified as acute or chronic: Secondary | ICD-10-CM | POA: Insufficient documentation

## 2019-06-05 DIAGNOSIS — R32 Unspecified urinary incontinence: Secondary | ICD-10-CM | POA: Insufficient documentation

## 2019-06-05 NOTE — Assessment & Plan Note (Signed)
For nitrofurantoin qhs,  to f/u any worsening symptoms or concerns

## 2019-06-05 NOTE — Assessment & Plan Note (Signed)
Left, in the context of recurrent UTI, for septra ds after urine studies, f/u culture

## 2019-06-05 NOTE — Assessment & Plan Note (Signed)
Pt states will f/u with urology as planned

## 2019-06-08 DIAGNOSIS — N183 Chronic kidney disease, stage 3 (moderate): Secondary | ICD-10-CM | POA: Diagnosis not present

## 2019-06-08 DIAGNOSIS — I129 Hypertensive chronic kidney disease with stage 1 through stage 4 chronic kidney disease, or unspecified chronic kidney disease: Secondary | ICD-10-CM | POA: Diagnosis not present

## 2019-06-15 ENCOUNTER — Other Ambulatory Visit: Payer: Self-pay | Admitting: Endocrinology

## 2019-06-18 ENCOUNTER — Other Ambulatory Visit: Payer: Self-pay | Admitting: Internal Medicine

## 2019-06-18 ENCOUNTER — Other Ambulatory Visit: Payer: Self-pay | Admitting: Endocrinology

## 2019-06-18 DIAGNOSIS — Z1231 Encounter for screening mammogram for malignant neoplasm of breast: Secondary | ICD-10-CM

## 2019-06-21 ENCOUNTER — Other Ambulatory Visit: Payer: Medicare Other

## 2019-06-21 ENCOUNTER — Other Ambulatory Visit (INDEPENDENT_AMBULATORY_CARE_PROVIDER_SITE_OTHER): Payer: Medicare Other

## 2019-06-21 DIAGNOSIS — E1165 Type 2 diabetes mellitus with hyperglycemia: Secondary | ICD-10-CM

## 2019-06-21 DIAGNOSIS — Z794 Long term (current) use of insulin: Secondary | ICD-10-CM | POA: Diagnosis not present

## 2019-06-21 LAB — BASIC METABOLIC PANEL
BUN: 34 mg/dL — ABNORMAL HIGH (ref 6–23)
CO2: 23 mEq/L (ref 19–32)
Calcium: 9.4 mg/dL (ref 8.4–10.5)
Chloride: 106 mEq/L (ref 96–112)
Creatinine, Ser: 2.14 mg/dL — ABNORMAL HIGH (ref 0.40–1.20)
GFR: 27.62 mL/min — ABNORMAL LOW (ref >60.00)
Glucose, Bld: 155 mg/dL — ABNORMAL HIGH (ref 70–99)
Potassium: 5.4 mEq/L — ABNORMAL HIGH (ref 3.5–5.1)
Sodium: 137 mEq/L (ref 135–145)

## 2019-06-21 LAB — TSH: TSH: 1.01 u[IU]/mL (ref 0.35–4.50)

## 2019-06-21 LAB — T4, FREE: Free T4: 0.95 ng/dL (ref 0.60–1.60)

## 2019-06-22 ENCOUNTER — Other Ambulatory Visit: Payer: Self-pay | Admitting: Endocrinology

## 2019-06-22 LAB — FRUCTOSAMINE: Fructosamine: 364 umol/L — ABNORMAL HIGH (ref 0–285)

## 2019-06-24 ENCOUNTER — Ambulatory Visit (INDEPENDENT_AMBULATORY_CARE_PROVIDER_SITE_OTHER): Payer: Medicare Other | Admitting: Endocrinology

## 2019-06-24 ENCOUNTER — Other Ambulatory Visit: Payer: Self-pay

## 2019-06-24 ENCOUNTER — Encounter: Payer: Self-pay | Admitting: Endocrinology

## 2019-06-24 VITALS — BP 142/72 | HR 87 | Ht 65.0 in | Wt 303.2 lb

## 2019-06-24 DIAGNOSIS — R400 Somnolence: Secondary | ICD-10-CM

## 2019-06-24 DIAGNOSIS — E1165 Type 2 diabetes mellitus with hyperglycemia: Secondary | ICD-10-CM | POA: Diagnosis not present

## 2019-06-24 DIAGNOSIS — E890 Postprocedural hypothyroidism: Secondary | ICD-10-CM

## 2019-06-24 DIAGNOSIS — Z794 Long term (current) use of insulin: Secondary | ICD-10-CM | POA: Diagnosis not present

## 2019-06-24 MED ORDER — SOLIQUA 100-33 UNT-MCG/ML ~~LOC~~ SOPN: 26.0000 [IU] | PEN_INJECTOR | Freq: Every day | SUBCUTANEOUS | 3 refills | Status: DC

## 2019-06-24 NOTE — Progress Notes (Signed)
[name] is a 68 y.o. female.    Reason for Appointment: Diabetes follow-up   History of Present Illness   Diagnosis: Type 2 DIABETES MELITUS, date of diagnosis: 2000  Previous history: She had initially been treated with metformin and Amaryl and subsequently given Victoza which helped overall control and weight loss. In 9/13 because of significant hyperglycemia she was started on basal bolus insulin also. Had required relatively small doses of insulin. Her blood sugar control has been excellent with A1c between 5.3-6.0 although this may be relatively higher than expected. Tends to have relatively higher readings after supper In 6/15 she was told to resume her Victoza and stop her Lantus since she had been gaining weight with Lantus and NovoLog regimen.   Recent history:     INSULIN regimen: Novolog 12 units before breakfast and 10 units before dinner, usually no lunch   Non-insulin hypoglycemic drugs: Soliqua 24 units daily   Her A1c is last 7, previously 7.6   Current blood sugar patterns and management:  She was told to check her sugars more consistently with her freestyle libre but data available is only for about 50% again  She has postprandial hyperglycemia as described below  However as before her freestyle [name] seems to be somewhat lower than the actual readings  Hypoglycemia likely not happening since readings overnight that low on the sensor are probably falsely low  She is asymptomatic at those times  She is trying to be consistent with taking her NovoLog before each meal but blood sugar fluctuates and may go up more significantly on some days based on her carbohydrate intake which she does not take into account    Side effects from medications:  none  Monitors blood glucose:   Glucometer:  Freestyle Libre/[name] .        CONTINUOUS GLUCOSE MONITORING RECORD INTERPRETATION    Dates of Recording: 9/4 through 9/17  Sensor description:  [employer]  Results statistics:   CGM use % of time  53  Average and SD  151, GV 41   Time in range    55    %  % Time Above 180  28  % Time above 250  7  % Time Below target  10    Glycemic patterns summary: Her freestyle [name] appears to be reading falsely low at times as the patient thinks she has compared it with her [name] meter.  She did not have any blood sugar data at the time of her lab blood sugar draw to compare Blood sugars are quite variable from day-to-day Highest blood sugars after meals.  After her first meal between 2-3 PM and to some extent after evening meal Although the freestyle libre indicates hypoglycemia pattern between midnight and 4 AM she is asymptomatic and has not actually checked her sugars at those times  Hyperglycemic episodes are occurring inconsistently after her first meal which is around 10-11 AM and also after her dinner which is around 6 PM  Hypoglycemic episodes occurred overnight as above  Overnight periods: Blood sugar data is incomplete but glucose readings are low normal between midnight and 4 AM and then gradually rising subsequently and are just above 100 until about 9 AM  Preprandial periods: Blood sugars are averaging about 105 before dinnertime and 185 before her evening meal  Postprandial periods:   After breakfast:   Blood sugars increasing fairly consistently with an average about 200 postprandially but some variability present and that is  somewhat incomplete also  After dinner: Bedtime is relatively inconsistent her blood sugars are either the same after the meal are rising with highest reading 278 with average 191   Meals: 1-2 meals per day. Lunch 12 Supper at 5 PM            Physical activity: exercise: Limited, has had fatigue, back and leg pain.            Dietician visit: Most recent: [id number]            Complications: are: None  Wt Readings from Last 3 Encounters:  06/24/19 (!) 303 lb 3.2 oz (137.5 kg)   06/01/19 285 lb (129.3 kg)  05/12/19 288 lb 3.2 oz (130.7 kg)    LABS:  Lab Results  Component Value Date   HGBA1C 7.0 (A) 05/12/2019   HGBA1C 7.6 (H) 02/08/2019   HGBA1C 7.2 (A) 02/08/2019   Lab Results  Component Value Date   MICROALBUR <0.7 04/22/2019   [place] 85 06/15/2018   CREATININE 2.14 (H) 06/21/2019    Other active problems: See review of systems   Appointment on 06/21/2019  Component Date Value Ref Range Status  . TSH 06/21/2019 1.01  0.35 - 4.50 uIU/mL Final  . Free T4 06/21/2019 0.95  0.60 - 1.60 ng/dL Final   Comment: Specimens from patients who are undergoing biotin therapy and /or ingesting biotin supplements may contain high levels of biotin.  The higher biotin concentration in these specimens interferes with this Free T4 assay.  Specimens that contain high levels  of biotin may cause false high results for this Free T4 assay.  Please interpret results in light of the total clinical presentation of the patient.    . Fructosamine 06/21/2019 364* 0 - 285 umol/L Final   Comment: Published reference interval for apparently healthy subjects between age 50 and 52 is 63 - 285 umol/L and in a poorly controlled diabetic population is 228 - 563 umol/L with a mean of 396 umol/L.   Marland Kitchen Sodium 06/21/2019 137  135 - 145 mEq/L Final  . Potassium 06/21/2019 5.4 No hemolysis seen* 3.5 - 5.1 mEq/L Final  . Chloride 06/21/2019 106  96 - 112 mEq/L Final  . CO2 06/21/2019 23  19 - 32 mEq/L Final  . Glucose, Bld 06/21/2019 155* 70 - 99 mg/dL Final  . BUN 06/21/2019 34* 6 - 23 mg/dL Final  . Creatinine, Ser 06/21/2019 2.14* 0.40 - 1.20 mg/dL Final  . Calcium 06/21/2019 9.4  8.4 - 10.5 mg/dL Final  . GFR 06/21/2019 27.62* >60.00 mL/min Final    Allergies as of 06/24/2019      Reactions   Ciprofloxacin Shortness Of Breath, Other (See Comments)   Dizziness   Hydrocodone Shortness Of Breath, Other (See Comments)   Tachycardia   Penicillins Hives   Pt just remembers hives  Has patient had a PCN reaction causing immediate rash, facial/tongue/throat swelling, SOB or lightheadedness with hypotension: No Has patient had a PCN reaction causing severe rash involving mucus membranes or skin necrosis: No Has patient had a PCN reaction that required hospitalization No Has patient had a PCN reaction occurring within the last 10 years: No If all of the above answers are "NO", then may proceed with Cephalosporin use.      Medication List       Accurate as of June 24, 2019 11:59 PM. If you have any questions, ask your nurse or doctor.        STOP taking these medications  nitrofurantoin 50 MG capsule Commonly known as: MACRODANTIN Stopped by: Elayne Snare, MD   sulfamethoxazole-trimethoprim 800-160 MG tablet Commonly known as: BACTRIM DS Stopped by: Elayne Snare, MD     TAKE these medications   aspirin 81 MG EC tablet Take 81 mg by mouth every evening.   atorvastatin 40 MG tablet Commonly known as: LIPITOR Take 1 tablet (40 mg total) by mouth at bedtime.   buPROPion 150 MG 24 hr tablet Commonly known as: Wellbutrin XL Take 1 tablet (150 mg total) by mouth daily.   cloNIDine 0.1 MG tablet Commonly known as: CATAPRES TAKE ONE TABLET BY MOUTH TWICE A DAY   Elmiron 100 MG capsule Generic drug: pentosan polysulfate Take 200 mg by mouth 2 (two) times daily.   FLUoxetine 20 MG capsule Commonly known as: PROZAC TAKE 1 CAPSULE BY MOUTH DAILY   FreeStyle Libre 14 Day Reader Kerrin Mo by Does not apply route.   gabapentin 100 MG capsule Commonly known as: NEURONTIN TAKE ONE CAPSULE BY MOUTH THREE TIMES A DAY   glucose blood test strip Commonly known as: FREESTYLE TEST STRIPS Use as instructed to check blood sugar 4 times daily.   insulin aspart 100 UNIT/ML FlexPen Commonly known as: NovoLOG FlexPen 5 to 7 units before meals   levothyroxine 125 MCG tablet Commonly known as: SYNTHROID 1 tablet daily and extra half tablet on Sundays   linaclotide  72 MCG capsule Commonly known as: Linzess Take 1 capsule (72 mcg total) by mouth daily before breakfast.   losartan 100 MG tablet Commonly known as: COZAAR TAKE 1 TABLET(100 MG) BY MOUTH DAILY   metoprolol succinate 50 MG 24 hr tablet Commonly known as: TOPROL-XL TAKE 1 TABLET BY MOUTH DAILY WITH OR IMMEDIATELY FOLLOWING A MEAL   naltrexone 50 MG tablet Commonly known as: DEPADE Take 1 tablet (50 mg total) by mouth daily.   phenazopyridine 100 MG tablet Commonly known as: PYRIDIUM Take 1 tablet (100 mg total) by mouth 3 (three) times daily as needed for pain.   promethazine 25 MG tablet Commonly known as: PHENERGAN Take 1 tablet (25 mg total) by mouth every 8 (eight) hours as needed for nausea or vomiting.   Soliqua 100-33 UNT-MCG/ML Sopn Generic drug: Insulin Glargine-Lixisenatide Inject 26 Units into the skin daily with breakfast.   traMADol 50 MG tablet Commonly known as: ULTRAM Take 1 tablet (50 mg total) by mouth every 6 (six) hours as needed.       Allergies:  Allergies  Allergen Reactions  . Ciprofloxacin Shortness Of Breath and Other (See Comments)    Dizziness   . Hydrocodone Shortness Of Breath and Other (See Comments)    Tachycardia  . Penicillins Hives    Pt just remembers hives Has patient had a PCN reaction causing immediate rash, facial/tongue/throat swelling, SOB or lightheadedness with hypotension: No Has patient had a PCN reaction causing severe rash involving mucus membranes or skin necrosis: No Has patient had a PCN reaction that required hospitalization No Has patient had a PCN reaction occurring within the last 10 years: No If all of the above answers are "NO", then may proceed with Cephalosporin use.     Past Medical History:  Diagnosis Date  . Anxiety   . Arthritis   . Blood dyscrasia    "FREE BLEEDER"  . CERVICAL RADICULOPATHY, LEFT   . Chronic back pain   . CKD (chronic kidney disease)    DR. SANFORD  [facility]  . COMMON  MIGRAINE   . CORONARY  ARTERY DISEASE   . Cough    CURRENT COLD  . Cystitis   . DEPRESSION   . Diabetes mellitus, type II ([place])   . DIVERTICULOSIS-COLON   . Dysrhythmia    palpitations  . Gastric ulcer 04/2008  . Gastroparesis   . GERD (gastroesophageal reflux disease)   . Hiatal hernia   . Hyperlipidemia   . Hypertension   . Hypothyroidism   . INSOMNIA-SLEEP DISORDER-UNSPEC   . Iron deficiency anemia   . Wears glasses     Past Surgical History:  Procedure Laterality Date  . ABDOMINAL HYSTERECTOMY    . ANTERIOR LAT LUMBAR FUSION Left 08/13/2017   Procedure: LEFT SIDED LUMBAR 3-4 LATERAL INTERBODY FUSION WITH INSTRUMENTATION AND ALLOGRAFT;  Surgeon: Phylliss Bob, MD;  Location: [facility];  Service: Orthopedics;  Laterality: Left;  LEFT SIDED LUMBAR 3-4 LATERAL INTERBODY FUSION WITH INSTRUMENTATION AND ALLOGRAFT; REQUEST 3 HOURS  . Back Stimulator  07/2018  . BACK SURGERY  03/2016  . BREAST EXCISIONAL BIOPSY Right    40 years ago  . CHOLECYSTECTOMY  06/2009  . COLONOSCOPY    . ESOPHAGOGASTRODUODENOSCOPY    . EYE SURGERY Bilateral    lasik  . Gastric Wedge resection lipoma  11/2007   x2 with laparotomy and gastrostomy  . JOINT REPLACEMENT    . Left knee replacement    . Rigth knee replacement with revision  04/2008   Dr. Berenice Primas  . ROTATOR CUFF REPAIR Left 01/2009  . s/p bladder surgury  09/2009   Dr. Terance Hart  . SPINAL CORD STIMULATOR INSERTION N/A 09/11/2018   Procedure: LUMBAR SPINAL CORD STIMULATOR INSERTION;  Surgeon: Clydell Hakim, MD;  Location: [place];  Service: Neurosurgery;  Laterality: N/A;  LUMBAR SPINAL CORD STIMULATOR INSERTION    Family History  Problem Relation Age of Onset  . Diabetes Sister        x 3  . Heart disease Sister        x2  . Breast cancer Sister 4  . Diabetes Brother        x3  . Heart disease Brother        x2  . Coronary artery disease Other        female 1st degree  . Hypertension Other   . Breast cancer Sister 82  . Breast cancer  Other 25  . Diabetes Brother   . Colon cancer Neg Hx     Social History:  reports that she has quit smoking. She has never used smokeless tobacco. She reports that she does not drink alcohol or use drugs.  Review of Systems:  Hypertension: Her blood pressure is treated with losartan 100 mg and clonidine Followed by nephrologist and PCP  CKD: Her creatinine is variable, followed by nephrologist and recently stable Potassium is high on this labs  Lab Results  Component Value Date   CREATININE 2.14 (H) 06/21/2019   CREATININE 1.60 (H) 04/26/2019   CREATININE 1.74 (H) 04/22/2019   CREATININE 1.65 (H) 02/17/2019   Lab Results  Component Value Date   K 5.4 No hemolysis seen (H) 06/21/2019     Lipids: Followed by PCP, LDL has been controlled  Taking Lipitor 40 mg with results as follows  Lab Results  Component Value Date   CHOL 150 06/15/2018   HDL 50.00 06/15/2018   LDLCALC 85 06/15/2018   LDLDIRECT 156.6 01/07/2011   TRIG 77.0 06/15/2018   CHOLHDL 3 06/15/2018     Post ablative hypothyroidism:  Previously she had I-131  treatment on 08/20/11 for Graves' disease  Her dose has been adjusted periodically  She has been recently taking 125 mg levothyroxine prescription consistently and has been asked to take 1-1/2 tablets except 1 tablet on Saturdays   Her only complaints are feeling sleepy after meals as before TSH is again normal and not as low  Lab Results  Component Value Date   TSH 1.01 06/21/2019   TSH 0.58 04/22/2019   TSH 0.49 02/08/2019   FREET4 0.95 06/21/2019   FREET4 0.86 04/22/2019   FREET4 0.82 02/08/2019    On Gabapentin for neuropathy    Examination:   BP (!) 142/72 (BP Location: Left Arm, Patient Position: Sitting, Cuff Size: Large)   Pulse 87   Ht 5\' 5"  (1.651 m)   Wt (!) 303 lb 3.2 oz (137.5 kg)   SpO2 97%   BMI 50.46 kg/m   Body mass index is 50.46 kg/m.     ASSESSMENT/ PLAN:   Diabetes type 2,  with obesity:   See history of  present illness for detailed discussion of current management, blood sugar patterns and problems identified  A1C is recently stable at 7, previously was at 7.2  However A1c is reading falsely high She does have significant postprandial hyperglycemia which is variable depending on her meal size She is limited in her dose of Soliqua because of low normal readings overnight Still requires relatively large doses of NovoLog at meals which is generally not adequate on Sundays Discussed her blood sugar patterns and need to verify her freestyle libre with the [name] also  She has been asked to take her morning insulin ahead of time for breakfast but she does not always do so and she does appear to have a dawn phenomenon again  Although she may benefit from taking NPH insulin in the morning for control of blood sugars midmorning this may not cover her breakfast meal and premixed insulin may not be able to be adjusted adequately also    Recommendations for diabetes:  She will try to take at least 14 units for breakfast and higher doses for larger meals and more carbohydrate  Soliqua 22 units  Also increase her suppertime dose 2 to 4 units if eating more carbohydrate  She was reminded that her freestyle [name] does not have adequate information especially late at night and needs to check her sugars more consistently after supper  She will bring her [name] monitor also on the next visit for review    Hypothyroidism: TSH is normal with 125 mcg dose, 1-1/2 pills during the week and only 1 tablet on Saturdays  Hypertension: Fairly well controlled   Daytime somnolence: May be related to clonidine and she can try the patch which she has at home instead of 0.1 mg tablets, she will call if this helps her symptoms  Hyperkalemia: We will forwarded labs to her nephrologist for action  Total visit time for evaluation and management of multiple problems and counseling =25 minutes       Patient Instructions  Try clonidine patches not pill  Try Novolog 10-15 min before meals  14 units in am  Soliqua 22 units daily      Elayne Snare 06/25/2019, 11:37 AM   Note: This office note was prepared with Dragon voice recognition system technology. Any transcriptional errors that result from this process are unintentional.

## 2019-06-24 NOTE — Patient Instructions (Signed)
Try clonidine patches not pill  Try Novolog 10-15 min before meals  14 units in am  Soliqua 22 units daily

## 2019-06-25 ENCOUNTER — Ambulatory Visit (INDEPENDENT_AMBULATORY_CARE_PROVIDER_SITE_OTHER): Payer: Medicare Other

## 2019-06-25 ENCOUNTER — Other Ambulatory Visit: Payer: Self-pay

## 2019-06-25 DIAGNOSIS — Z23 Encounter for immunization: Secondary | ICD-10-CM | POA: Diagnosis not present

## 2019-07-02 DIAGNOSIS — M5127 Other intervertebral disc displacement, lumbosacral region: Secondary | ICD-10-CM | POA: Diagnosis not present

## 2019-07-02 DIAGNOSIS — Z9689 Presence of other specified functional implants: Secondary | ICD-10-CM | POA: Diagnosis not present

## 2019-07-02 DIAGNOSIS — I10 Essential (primary) hypertension: Secondary | ICD-10-CM | POA: Diagnosis not present

## 2019-07-05 ENCOUNTER — Telehealth (HOSPITAL_COMMUNITY): Payer: Self-pay | Admitting: *Deleted

## 2019-07-05 NOTE — Telephone Encounter (Signed)
Left voicemail asking pt to return my call to schedule [place]

## 2019-07-05 NOTE — Telephone Encounter (Signed)
Left voicemail asking for return call.

## 2019-07-06 ENCOUNTER — Telehealth (HOSPITAL_COMMUNITY): Payer: Self-pay | Admitting: *Deleted

## 2019-07-06 NOTE — Telephone Encounter (Signed)
The above patient or their representative was contacted and gave the following answers to these questions:         Do you have any of the following symptoms?  no Fever                    Cough                   Shortness of breath  Do  you have any of the following other symptoms? no   muscle pain         vomiting,        diarrhea        rash         weakness        red eye        abdominal pain         bruising          bruising or bleeding              joint pain    Yes-knee replacement       severe headache    Have you been in contact with someone who was or has been sick in the past 2 weeks?   no  Yes                 Unsure                         Unable to assess   Does the person that you were in contact with have any of the following symptoms?   Cough         shortness of breath           muscle pain         vomiting,            diarrhea            rash            weakness           fever            red eye           abdominal pain           bruising  or  bleeding                joint pain                severe headache               Have you  or someone you have been in contact with traveled internationally in th last month?         If yes, which countries?   Have you  or someone you have been in contact with traveled outside [place] in th last month?         If yes, which state and city?   COMMENTS OR ACTION PLAN FOR THIS PATIENT:

## 2019-07-07 ENCOUNTER — Other Ambulatory Visit (HOSPITAL_COMMUNITY): Payer: Self-pay | Admitting: Neurosurgery

## 2019-07-07 DIAGNOSIS — M7989 Other specified soft tissue disorders: Secondary | ICD-10-CM

## 2019-07-08 ENCOUNTER — Other Ambulatory Visit: Payer: Self-pay

## 2019-07-08 ENCOUNTER — Ambulatory Visit (HOSPITAL_COMMUNITY)
Admission: RE | Admit: 2019-07-08 | Discharge: 2019-07-08 | Disposition: A | Discharge status: Home / Self Care | Payer: Medicare Other | Source: Ambulatory Visit | Attending: Family | Admitting: Family

## 2019-07-08 DIAGNOSIS — M7989 Other specified soft tissue disorders: Secondary | ICD-10-CM | POA: Diagnosis not present

## 2019-07-15 ENCOUNTER — Other Ambulatory Visit: Payer: Self-pay | Admitting: Endocrinology

## 2019-07-28 ENCOUNTER — Telehealth: Payer: Self-pay

## 2019-07-28 ENCOUNTER — Other Ambulatory Visit: Payer: Self-pay

## 2019-07-28 MED ORDER — SOLIQUA 100-33 UNT-MCG/ML ~~LOC~~ SOPN: 26.0000 [IU] | PEN_INJECTOR | Freq: Every day | SUBCUTANEOUS | 3 refills | Status: DC

## 2019-07-28 NOTE — Telephone Encounter (Signed)
Rx was sent. Spoke to pt and pt's spouse regarding this refill.

## 2019-07-28 NOTE — Telephone Encounter (Signed)
Patient husband called in for wife refill on her Insulin Glargine-Lixisenatide (SOLIQUA) 100-33 UNT-MCG/ML SOPN  Please call husband and advise

## 2019-07-29 ENCOUNTER — Other Ambulatory Visit: Payer: Self-pay

## 2019-07-29 MED ORDER — SOLIQUA 100-33 UNT-MCG/ML ~~LOC~~ SOPN: 26.0000 [IU] | PEN_INJECTOR | Freq: Every day | SUBCUTANEOUS | 3 refills | Status: DC

## 2019-08-03 ENCOUNTER — Ambulatory Visit
Admission: RE | Admit: 2019-08-03 | Discharge: 2019-08-03 | Disposition: A | Discharge status: Home / Self Care | Payer: Medicare Other | Source: Ambulatory Visit | Attending: Internal Medicine | Admitting: Internal Medicine

## 2019-08-03 ENCOUNTER — Other Ambulatory Visit: Payer: Self-pay

## 2019-08-03 DIAGNOSIS — Z1231 Encounter for screening mammogram for malignant neoplasm of breast: Secondary | ICD-10-CM

## 2019-08-04 ENCOUNTER — Telehealth: Payer: Self-pay

## 2019-08-04 DIAGNOSIS — E119 Type 2 diabetes mellitus without complications: Secondary | ICD-10-CM | POA: Diagnosis not present

## 2019-08-04 DIAGNOSIS — E785 Hyperlipidemia, unspecified: Secondary | ICD-10-CM | POA: Diagnosis not present

## 2019-08-04 DIAGNOSIS — E1140 Type 2 diabetes mellitus with diabetic neuropathy, unspecified: Secondary | ICD-10-CM | POA: Diagnosis not present

## 2019-08-04 DIAGNOSIS — G894 Chronic pain syndrome: Secondary | ICD-10-CM | POA: Diagnosis not present

## 2019-08-04 DIAGNOSIS — I25119 Atherosclerotic heart disease of native coronary artery with unspecified angina pectoris: Secondary | ICD-10-CM | POA: Diagnosis not present

## 2019-08-04 DIAGNOSIS — N189 Chronic kidney disease, unspecified: Secondary | ICD-10-CM | POA: Diagnosis not present

## 2019-08-04 DIAGNOSIS — M5416 Radiculopathy, lumbar region: Secondary | ICD-10-CM | POA: Diagnosis not present

## 2019-08-04 DIAGNOSIS — M1990 Unspecified osteoarthritis, unspecified site: Secondary | ICD-10-CM | POA: Diagnosis not present

## 2019-08-04 DIAGNOSIS — I10 Essential (primary) hypertension: Secondary | ICD-10-CM | POA: Diagnosis not present

## 2019-08-04 NOTE — Telephone Encounter (Signed)
Called pt and spoke with pt's husband to inform him that the pt has been denied coverage for [facility] via patient assistance because her insurance does cover the medication. Pt's husband stated that he would inform the pt.

## 2019-08-12 ENCOUNTER — Other Ambulatory Visit: Payer: Self-pay | Admitting: Podiatry

## 2019-08-12 ENCOUNTER — Ambulatory Visit (INDEPENDENT_AMBULATORY_CARE_PROVIDER_SITE_OTHER): Payer: Medicare Other | Admitting: Podiatry

## 2019-08-12 ENCOUNTER — Other Ambulatory Visit: Payer: Self-pay

## 2019-08-12 ENCOUNTER — Ambulatory Visit (INDEPENDENT_AMBULATORY_CARE_PROVIDER_SITE_OTHER): Payer: Medicare Other

## 2019-08-12 ENCOUNTER — Other Ambulatory Visit: Payer: Self-pay | Admitting: Endocrinology

## 2019-08-12 DIAGNOSIS — M779 Enthesopathy, unspecified: Secondary | ICD-10-CM | POA: Diagnosis not present

## 2019-08-12 DIAGNOSIS — M25572 Pain in left ankle and joints of left foot: Secondary | ICD-10-CM

## 2019-08-12 DIAGNOSIS — M205X2 Other deformities of toe(s) (acquired), left foot: Secondary | ICD-10-CM

## 2019-08-12 DIAGNOSIS — M76822 Posterior tibial tendinitis, left leg: Secondary | ICD-10-CM

## 2019-08-12 DIAGNOSIS — M79672 Pain in left foot: Secondary | ICD-10-CM

## 2019-08-12 NOTE — Progress Notes (Signed)
Subjective:   Patient ID: [name], female   DOB: 68 y.o.   MRN: [id number]   HPI Patient presents with significant discomfort on the medial side of the left ankle generalized edema that is been present for a while in the lower leg with negative Bevelyn Buckles' sign noted and patient is noted to have inflammation pain around the first MPJ into the dorsal tendon complex and I did noted reduced range of motion first MPJ left foot   ROS      Objective:  Physical Exam  Neurovascular status intact with patient found to have several points of pain with mild edema noted with obesity is complicating factor.  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  Today I did a sterile prep and injected the posterior tibial insertion into the navicular 3 mg Kenalog 5 mg Xylocaine and I went ahead and did a first MPJ injection coming from the lateral side 3 mg dexamethasone 5 mg Xylocaine and reviewed hallux limitus with the patient and do not recommend surgical intervention currently.  I advised this patient on fascial brace usage which was dispensed and supportive shoes  X-rays indicate that there is moderate depression of the arch with mild changes around the first MPJ left with flattened arch noted sign visit

## 2019-08-17 DIAGNOSIS — E113293 Type 2 diabetes mellitus with mild nonproliferative diabetic retinopathy without macular edema, bilateral: Secondary | ICD-10-CM | POA: Diagnosis not present

## 2019-08-17 DIAGNOSIS — H43813 Vitreous degeneration, bilateral: Secondary | ICD-10-CM | POA: Diagnosis not present

## 2019-08-17 DIAGNOSIS — H2513 Age-related nuclear cataract, bilateral: Secondary | ICD-10-CM | POA: Diagnosis not present

## 2019-08-23 ENCOUNTER — Other Ambulatory Visit: Payer: Self-pay

## 2019-08-23 ENCOUNTER — Ambulatory Visit (INDEPENDENT_AMBULATORY_CARE_PROVIDER_SITE_OTHER): Payer: Medicare Other | Admitting: Internal Medicine

## 2019-08-23 ENCOUNTER — Encounter: Payer: Self-pay | Admitting: Internal Medicine

## 2019-08-23 ENCOUNTER — Other Ambulatory Visit (INDEPENDENT_AMBULATORY_CARE_PROVIDER_SITE_OTHER): Payer: Medicare Other

## 2019-08-23 VITALS — BP 142/84 | HR 69 | Temp 98.3°F | Wt 287.0 lb

## 2019-08-23 DIAGNOSIS — I10 Essential (primary) hypertension: Secondary | ICD-10-CM

## 2019-08-23 DIAGNOSIS — E119 Type 2 diabetes mellitus without complications: Secondary | ICD-10-CM

## 2019-08-23 DIAGNOSIS — R300 Dysuria: Secondary | ICD-10-CM

## 2019-08-23 DIAGNOSIS — Z1159 Encounter for screening for other viral diseases: Secondary | ICD-10-CM

## 2019-08-23 LAB — URINALYSIS, ROUTINE W REFLEX MICROSCOPIC
Hgb urine dipstick: NEGATIVE
Nitrite: POSITIVE — AB
RBC / HPF: NONE SEEN (ref 0–?)
Specific Gravity, Urine: 1.02 (ref 1.000–1.030)
Total Protein, Urine: 100 — AB
Urine Glucose: 100 — AB
Urobilinogen, UA: >=8 — AB (ref 0.0–1.0)
pH: 6.5 (ref 5.0–8.0)

## 2019-08-23 MED ORDER — NITROFURANTOIN MACROCRYSTAL 50 MG PO CAPS: 50.0000 mg | ORAL_CAPSULE | Freq: Two times a day (BID) | ORAL | 0 refills | Status: DC

## 2019-08-23 NOTE — Assessment & Plan Note (Signed)
Mild to mod, for antibx course and urine studies, to f/u any worsening symptoms or concerns

## 2019-08-23 NOTE — Patient Instructions (Signed)
Please take all new medication as prescribed  - the antibiotic  Please continue all other medications as before, and refills have been done if requested.  Please have the pharmacy call with any other refills you may need.  Please continue your efforts at being more active, low cholesterol diet, and weight control.  Please keep your appointments with your specialists as you may have planned  Please go to the LAB in the Basement (turn left off the elevator) for the tests to be done today - just the urine testing today  You will be contacted by phone if any changes need to be made immediately.  Otherwise, you will receive a letter about your results with an explanation, but please check with MyChart first.  Please remember to sign up for MyChart if you have not done so, as this will be important to you in the future with finding out test results, communicating by private email, and scheduling acute appointments online when needed.    

## 2019-08-23 NOTE — Assessment & Plan Note (Signed)
stable overall by history and exam, recent data reviewed with pt, and pt to continue medical treatment as before,  to f/u any worsening symptoms or concerns  

## 2019-08-23 NOTE — Progress Notes (Signed)
Subjective:    Patient ID: [name], female    DOB: Nov 01, 1950, 67 y.o.   MRN: [id number]  HPI  Here to f/u with 3 days onset urinary freq and dysuria, but Denies urinary symptoms such as urgency, flank pain, hematuria or n/v, fever, chills.  Pt denies chest pain, increased sob or doe, wheezing, orthopnea, PND, increased LE swelling, palpitations, dizziness or syncope.  Pt denies new neurological symptoms such as new headache, or facial or extremity weakness or numbness   Pt denies polydipsia, polyuria  Declines colonoscopy due to covid fear.   Past Medical History:  Diagnosis Date  . Anxiety   . Arthritis   . Blood dyscrasia    "FREE BLEEDER"  . CERVICAL RADICULOPATHY, LEFT   . Chronic back pain   . CKD (chronic kidney disease)    DR. SANFORD   [facility]  . COMMON MIGRAINE   . CORONARY ARTERY DISEASE   . Cough    CURRENT COLD  . Cystitis   . DEPRESSION   . Diabetes mellitus, type II ([place])   . DIVERTICULOSIS-COLON   . Dysrhythmia    palpitations  . Gastric ulcer 04/2008  . Gastroparesis   . GERD (gastroesophageal reflux disease)   . Hiatal hernia   . Hyperlipidemia   . Hypertension   . Hypothyroidism   . INSOMNIA-SLEEP DISORDER-UNSPEC   . Iron deficiency anemia   . Wears glasses    Past Surgical History:  Procedure Laterality Date  . ABDOMINAL HYSTERECTOMY    . ANTERIOR LAT LUMBAR FUSION Left 08/13/2017   Procedure: LEFT SIDED LUMBAR 3-4 LATERAL INTERBODY FUSION WITH INSTRUMENTATION AND ALLOGRAFT;  Surgeon: Phylliss Bob, MD;  Location: [facility];  Service: Orthopedics;  Laterality: Left;  LEFT SIDED LUMBAR 3-4 LATERAL INTERBODY FUSION WITH INSTRUMENTATION AND ALLOGRAFT; REQUEST 3 HOURS  . Back Stimulator  07/2018  . BACK SURGERY  03/2016  . BREAST EXCISIONAL BIOPSY Right    40 years ago  . CHOLECYSTECTOMY  06/2009  . COLONOSCOPY    . ESOPHAGOGASTRODUODENOSCOPY    . EYE SURGERY Bilateral    lasik  . Gastric Wedge resection lipoma  11/2007   x2 with laparotomy  and gastrostomy  . JOINT REPLACEMENT    . Left knee replacement    . Rigth knee replacement with revision  04/2008   Dr. Berenice Primas  . ROTATOR CUFF REPAIR Left 01/2009  . s/p bladder surgury  09/2009   Dr. Terance Hart  . SPINAL CORD STIMULATOR INSERTION N/A 09/11/2018   Procedure: LUMBAR SPINAL CORD STIMULATOR INSERTION;  Surgeon: Clydell Hakim, MD;  Location: [facility];  Service: Neurosurgery;  Laterality: N/A;  LUMBAR SPINAL CORD STIMULATOR INSERTION    reports that she has quit smoking. She has never used smokeless tobacco. She reports that she does not drink alcohol or use drugs. family history includes Breast cancer (age of onset: 39) in an other family member; Breast cancer (age of onset: 54) in her sister; Breast cancer (age of onset: 67) in her sister; Coronary artery disease in an other family member; Diabetes in her brother, brother, and sister; Heart disease in her brother and sister; Hypertension in an other family member. Allergies  Allergen Reactions  . Ciprofloxacin Shortness Of Breath and Other (See Comments)    Dizziness   . Hydrocodone Shortness Of Breath and Other (See Comments)    Tachycardia  . Penicillins Hives    Pt just remembers hives Has patient had a PCN reaction causing immediate rash, facial/tongue/throat swelling, SOB or  lightheadedness with hypotension: No Has patient had a PCN reaction causing severe rash involving mucus membranes or skin necrosis: No Has patient had a PCN reaction that required hospitalization No Has patient had a PCN reaction occurring within the last 10 years: No If all of the above answers are "NO", then may proceed with Cephalosporin use.    Current Outpatient Medications on File Prior to Visit  Medication Sig Dispense Refill  . aspirin 81 MG EC tablet Take 81 mg by mouth every evening.     Marland Kitchen atorvastatin (LIPITOR) 40 MG tablet Take 1 tablet (40 mg total) by mouth at bedtime. 90 tablet 3  . buPROPion (WELLBUTRIN XL) 150 MG 24 hr tablet Take 1  tablet (150 mg total) by mouth daily. 90 tablet 3  . cloNIDine (CATAPRES) 0.1 MG tablet TAKE ONE TABLET BY MOUTH TWICE A DAY 60 tablet 0  . Continuous Blood Gluc Receiver (FREESTYLE LIBRE 14 DAY READER) DEVI by Does not apply route.    Marland Kitchen ELMIRON 100 MG capsule Take 200 mg by mouth 2 (two) times daily.     Marland Kitchen FLUoxetine (PROZAC) 20 MG capsule TAKE 1 CAPSULE BY MOUTH DAILY 90 capsule 3  . gabapentin (NEURONTIN) 100 MG capsule TAKE ONE CAPSULE BY MOUTH THREE TIMES A DAY 90 capsule 4  . gabapentin (NEURONTIN) 300 MG capsule     . glucose blood (FREESTYLE TEST STRIPS) test strip Use as instructed to check blood sugar 4 times daily. 100 each 3  . hydrOXYzine (ATARAX/VISTARIL) 10 MG tablet     . insulin aspart (NOVOLOG FLEXPEN) 100 UNIT/ML FlexPen 5 to 7 units before meals 15 pen 2  . Insulin Glargine-Lixisenatide (SOLIQUA) 100-33 UNT-MCG/ML SOPN Inject 26 Units into the skin daily with breakfast. 5 pen 3  . levothyroxine (SYNTHROID) 125 MCG tablet 1 tablet daily and extra half tablet on Sundays 96 tablet 3  . linaclotide (LINZESS) 72 MCG capsule Take 1 capsule (72 mcg total) by mouth daily before breakfast. 30 capsule 11  . losartan (COZAAR) 100 MG tablet TAKE 1 TABLET(100 MG) BY MOUTH DAILY 90 tablet 1  . metoprolol succinate (TOPROL-XL) 50 MG 24 hr tablet TAKE 1 TABLET BY MOUTH DAILY WITH OR IMMEDIATELY FOLLOWING A MEAL 30 tablet 3  . naltrexone (DEPADE) 50 MG tablet Take 1 tablet (50 mg total) by mouth daily. 90 tablet 3  . pantoprazole (PROTONIX) 40 MG tablet     . phenazopyridine (PYRIDIUM) 100 MG tablet Take 1 tablet (100 mg total) by mouth 3 (three) times daily as needed for pain. 40 tablet 0  . promethazine (PHENERGAN) 25 MG tablet Take 1 tablet (25 mg total) by mouth every 8 (eight) hours as needed for nausea or vomiting. 40 tablet 1  . traMADol (ULTRAM) 50 MG tablet Take 1 tablet (50 mg total) by mouth every 6 (six) hours as needed. 8 tablet 0   No current facility-administered medications on  file prior to visit.    Review of Systems  Constitutional: Negative for other unusual diaphoresis or sweats HENT: Negative for ear discharge or swelling Eyes: Negative for other worsening visual disturbances Respiratory: Negative for stridor or other swelling  Gastrointestinal: Negative for worsening distension or other blood Genitourinary: Negative for retention or other urinary change Musculoskeletal: Negative for other MSK pain or swelling Skin: Negative for color change or other new lesions Neurological: Negative for worsening tremors and other numbness  Psychiatric/Behavioral: Negative for worsening agitation or other fatigue All otherwise neg per pt  Objective:   Physical Exam BP (!) 142/84   Pulse 69   Temp 98.3 F (36.8 C) (Oral)   Wt 287 lb (130.2 kg)   SpO2 97%   BMI 47.76 kg/m  VS noted, mild ill Constitutional: Pt appears in NAD HENT: Head: NCAT.  Right Ear: External ear normal.  Left Ear: External ear normal.  Eyes: . Pupils are equal, round, and reactive to light. Conjunctivae and EOM are normal Nose: without d/c or deformity Neck: Neck supple. Gross normal ROM Cardiovascular: Normal rate and regular rhythm.   Pulmonary/Chest: Effort normal and breath sounds without rales or wheezing.  Abd:  Soft,  ND, + BS, no organomegaly, mild tender low mid abd, no flank tender Neurological: Pt is alert. At baseline orientation, motor grossly intact Skin: Skin is warm. No rashes, other new lesions, no LE edema Psychiatric: Pt behavior is normal without agitation  All otherwise neg per pt  Lab Results  Component Value Date   WBC 6.3 02/17/2019   HGB 10.6 (L) 02/17/2019   HCT 32.2 (L) 02/17/2019   PLT 222.0 02/17/2019   GLUCOSE 155 (H) 06/21/2019   CHOL 150 06/15/2018   TRIG 77.0 06/15/2018   HDL 50.00 06/15/2018   LDLDIRECT 156.6 01/07/2011   LDLCALC 85 06/15/2018   ALT 10 02/17/2019   AST 9 02/17/2019   NA 137 06/21/2019   K 5.4 No hemolysis seen (H)  06/21/2019   CL 106 06/21/2019   CREATININE 2.14 (H) 06/21/2019   BUN 34 (H) 06/21/2019   CO2 23 06/21/2019   TSH 1.01 06/21/2019   INR 1.21 08/07/2017   HGBA1C 7.0 (A) 05/12/2019   MICROALBUR <0.7 04/22/2019         Assessment & Plan:

## 2019-08-25 ENCOUNTER — Encounter: Payer: Self-pay | Admitting: Internal Medicine

## 2019-08-25 LAB — URINE CULTURE
MICRO NUMBER:: 1104204
SPECIMEN QUALITY:: ADEQUATE

## 2019-08-25 LAB — HEPATITIS C ANTIBODY
Hepatitis C Ab: NONREACTIVE
SIGNAL TO CUT-OFF: 0.1 (ref <1.00)

## 2019-08-26 ENCOUNTER — Ambulatory Visit: Payer: Medicare Other | Admitting: Podiatry

## 2019-08-30 ENCOUNTER — Telehealth: Payer: Self-pay | Admitting: Gastroenterology

## 2019-08-30 NOTE — Telephone Encounter (Signed)
Patient reports HA, abdominal pain, and black stools. She will come in and see Alonza Bogus, PA at 10:30 on Wed

## 2019-08-30 NOTE — Telephone Encounter (Signed)
Patient is calling stating she is having very bad upper abdomen pain that is going into her back. She is wanting to get in to be seen- I told her that Dr. Fuller Plan is booked out for the year and the PA's are booked until second week of December and she said she needs to get in before then and stated that "far out just wont work."

## 2019-09-01 ENCOUNTER — Encounter: Payer: Self-pay | Admitting: Gastroenterology

## 2019-09-01 ENCOUNTER — Ambulatory Visit (INDEPENDENT_AMBULATORY_CARE_PROVIDER_SITE_OTHER): Payer: Medicare Other | Admitting: Gastroenterology

## 2019-09-01 ENCOUNTER — Other Ambulatory Visit (INDEPENDENT_AMBULATORY_CARE_PROVIDER_SITE_OTHER): Payer: Medicare Other

## 2019-09-01 ENCOUNTER — Other Ambulatory Visit: Payer: Self-pay | Admitting: Gastroenterology

## 2019-09-01 VITALS — BP 134/70 | HR 72 | Temp 98.2°F | Ht 65.0 in | Wt 298.2 lb

## 2019-09-01 DIAGNOSIS — D649 Anemia, unspecified: Secondary | ICD-10-CM

## 2019-09-01 DIAGNOSIS — E119 Type 2 diabetes mellitus without complications: Secondary | ICD-10-CM | POA: Insufficient documentation

## 2019-09-01 DIAGNOSIS — R1012 Left upper quadrant pain: Secondary | ICD-10-CM

## 2019-09-01 DIAGNOSIS — K625 Hemorrhage of anus and rectum: Secondary | ICD-10-CM | POA: Diagnosis not present

## 2019-09-01 DIAGNOSIS — Z794 Long term (current) use of insulin: Secondary | ICD-10-CM | POA: Diagnosis not present

## 2019-09-01 DIAGNOSIS — Z8711 Personal history of peptic ulcer disease: Secondary | ICD-10-CM | POA: Diagnosis not present

## 2019-09-01 DIAGNOSIS — Z1159 Encounter for screening for other viral diseases: Secondary | ICD-10-CM | POA: Diagnosis not present

## 2019-09-01 LAB — CBC WITH DIFFERENTIAL/PLATELET
Basophils Absolute: 0.1 10*3/uL (ref 0.0–0.1)
Basophils Relative: 1.1 % (ref 0.0–3.0)
Eosinophils Absolute: 0.2 10*3/uL (ref 0.0–0.7)
Eosinophils Relative: 3.8 % (ref 0.0–5.0)
HCT: 34 % — ABNORMAL LOW (ref 36.0–46.0)
Hemoglobin: 10.8 g/dL — ABNORMAL LOW (ref 12.0–15.0)
Lymphocytes Relative: 26.1 % (ref 12.0–46.0)
Lymphs Abs: 1.6 10*3/uL (ref 0.7–4.0)
MCHC: 31.8 g/dL (ref 30.0–36.0)
MCV: 96.9 fl (ref 78.0–100.0)
Monocytes Absolute: 0.3 10*3/uL (ref 0.1–1.0)
Monocytes Relative: 4.6 % (ref 3.0–12.0)
Neutro Abs: 4 10*3/uL (ref 1.4–7.7)
Neutrophils Relative %: 64.4 % (ref 43.0–77.0)
Platelets: 231 10*3/uL (ref 150.0–400.0)
RBC: 3.51 Mil/uL — ABNORMAL LOW (ref 3.87–5.11)
RDW: 13.9 % (ref 11.5–15.5)
WBC: 6.3 10*3/uL (ref 4.0–10.5)

## 2019-09-01 MED ORDER — PANTOPRAZOLE SODIUM 40 MG PO TBEC: 40.0000 mg | DELAYED_RELEASE_TABLET | Freq: Two times a day (BID) | ORAL | 5 refills | Status: DC

## 2019-09-01 MED ORDER — SUPREP BOWEL PREP KIT 17.5-3.13-1.6 GM/177ML PO SOLN: 1.0000 | ORAL | 0 refills | Status: DC

## 2019-09-01 NOTE — Progress Notes (Signed)
09/01/2019 [name] [id number] 03-09-1951   HISTORY OF PRESENT ILLNESS: This is a 68 year old female who is a patient of Dr. Lynne Leader.  She follows here for chronic constipation for which she uses Linzess.  Also has GERD and gastroparesis.  Is on pantoprazole 40 mg daily.  Has history of ulcer disease that required partial antrectomy.  She is here today with complaints of left upper quadrant abdominal pain and black stools that began fairly suddenly last Friday.  She actually says that today the pain is improved in her stools have started to return to normal.  She has chronic anemia with hemoglobin ranging from 8.5 g to 11 g over the past 2 years or more.  Has had reported hematochezia in the past.  Last colonoscopy was in 2008.  Last EGD was in March 2014 that showed a small hiatal hernia, food residue in the stomach and changes status post partial antrectomy.  Had previously declined repeat colonoscopy on multiple occasions.   Past Medical History:  Diagnosis Date  . Anxiety   . Arthritis   . Blood dyscrasia    "FREE BLEEDER"  . CERVICAL RADICULOPATHY, LEFT   . Chronic back pain   . CKD (chronic kidney disease)    DR. SANFORD  [facility]  . COMMON MIGRAINE   . CORONARY ARTERY DISEASE   . Cough    CURRENT COLD  . Cystitis   . DEPRESSION   . Diabetes mellitus, type II ([place])   . DIVERTICULOSIS-COLON   . Dysrhythmia    palpitations  . Gastric ulcer 04/2008  . Gastroparesis   . GERD (gastroesophageal reflux disease)   . Hiatal hernia   . Hyperlipidemia   . Hypertension   . Hypothyroidism   . INSOMNIA-SLEEP DISORDER-UNSPEC   . Iron deficiency anemia   . Wears glasses    Past Surgical History:  Procedure Laterality Date  . ABDOMINAL HYSTERECTOMY    . ANTERIOR LAT LUMBAR FUSION Left 08/13/2017   Procedure: LEFT SIDED LUMBAR 3-4 LATERAL INTERBODY FUSION WITH INSTRUMENTATION AND ALLOGRAFT;  Surgeon: Phylliss Bob, MD;  Location: [facility];  Service: Orthopedics;   Laterality: Left;  LEFT SIDED LUMBAR 3-4 LATERAL INTERBODY FUSION WITH INSTRUMENTATION AND ALLOGRAFT; REQUEST 3 HOURS  . Back Stimulator  07/2018  . BACK SURGERY  03/2016  . BREAST EXCISIONAL BIOPSY Right    40 years ago  . CHOLECYSTECTOMY  06/2009  . COLONOSCOPY    . ESOPHAGOGASTRODUODENOSCOPY    . EYE SURGERY Bilateral    lasik  . Gastric Wedge resection lipoma  11/2007   x2 with laparotomy and gastrostomy  . JOINT REPLACEMENT    . Left knee replacement    . Rigth knee replacement with revision  04/2008   Dr. Berenice Primas  . ROTATOR CUFF REPAIR Left 01/2009  . s/p bladder surgury  09/2009   Dr. Terance Hart  . SPINAL CORD STIMULATOR INSERTION N/A 09/11/2018   Procedure: LUMBAR SPINAL CORD STIMULATOR INSERTION;  Surgeon: Clydell Hakim, MD;  Location: [place];  Service: Neurosurgery;  Laterality: N/A;  LUMBAR SPINAL CORD STIMULATOR INSERTION    reports that she has quit smoking. She has never used smokeless tobacco. She reports that she does not drink alcohol or use drugs. family history includes Breast cancer (age of onset: 4) in an other family member; Breast cancer (age of onset: 20) in her sister; Breast cancer (age of onset: 52) in her sister; Coronary artery disease in an other family member; Diabetes in her brother, brother,  and sister; Heart disease in her brother and sister; Hypertension in an other family member. Allergies  Allergen Reactions  . Ciprofloxacin Shortness Of Breath and Other (See Comments)    Dizziness   . Hydrocodone Shortness Of Breath and Other (See Comments)    Tachycardia  . Penicillins Hives    Pt just remembers hives Has patient had a PCN reaction causing immediate rash, facial/tongue/throat swelling, SOB or lightheadedness with hypotension: No Has patient had a PCN reaction causing severe rash involving mucus membranes or skin necrosis: No Has patient had a PCN reaction that required hospitalization No Has patient had a PCN reaction occurring within the last 10  years: No If all of the above answers are "NO", then may proceed with Cephalosporin use.       Outpatient Encounter Medications as of 09/01/2019  Medication Sig  . aspirin 81 MG EC tablet Take 81 mg by mouth every evening.   Marland Kitchen atorvastatin (LIPITOR) 40 MG tablet Take 1 tablet (40 mg total) by mouth at bedtime.  Marland Kitchen buPROPion (WELLBUTRIN XL) 150 MG 24 hr tablet Take 1 tablet (150 mg total) by mouth daily.  . cloNIDine (CATAPRES) 0.1 MG tablet TAKE ONE TABLET BY MOUTH TWICE A DAY  . Continuous Blood Gluc Receiver (FREESTYLE LIBRE 14 DAY READER) DEVI by Does not apply route.  Marland Kitchen ELMIRON 100 MG capsule Take 200 mg by mouth 2 (two) times daily.   Marland Kitchen FLUoxetine (PROZAC) 20 MG capsule TAKE 1 CAPSULE BY MOUTH DAILY  . gabapentin (NEURONTIN) 100 MG capsule TAKE ONE CAPSULE BY MOUTH THREE TIMES A DAY  . gabapentin (NEURONTIN) 300 MG capsule   . glucose blood (FREESTYLE TEST STRIPS) test strip Use as instructed to check blood sugar 4 times daily.  . hydrOXYzine (ATARAX/VISTARIL) 10 MG tablet   . insulin aspart (NOVOLOG FLEXPEN) 100 UNIT/ML FlexPen 5 to 7 units before meals  . Insulin Glargine-Lixisenatide (SOLIQUA) 100-33 UNT-MCG/ML SOPN Inject 26 Units into the skin daily with breakfast.  . levothyroxine (SYNTHROID) 125 MCG tablet 1 tablet daily and extra half tablet on Sundays  . linaclotide (LINZESS) 72 MCG capsule Take 1 capsule (72 mcg total) by mouth daily before breakfast.  . losartan (COZAAR) 100 MG tablet TAKE 1 TABLET(100 MG) BY MOUTH DAILY  . metoprolol succinate (TOPROL-XL) 50 MG 24 hr tablet TAKE 1 TABLET BY MOUTH DAILY WITH OR IMMEDIATELY FOLLOWING A MEAL  . naltrexone (DEPADE) 50 MG tablet Take 1 tablet (50 mg total) by mouth daily.  . pantoprazole (PROTONIX) 40 MG tablet   . promethazine (PHENERGAN) 25 MG tablet Take 1 tablet (25 mg total) by mouth every 8 (eight) hours as needed for nausea or vomiting.  . traMADol (ULTRAM) 50 MG tablet Take 1 tablet (50 mg total) by mouth every 6 (six)  hours as needed.  . [DISCONTINUED] nitrofurantoin (MACRODANTIN) 50 MG capsule Take 1 capsule (50 mg total) by mouth 2 (two) times daily.  . [DISCONTINUED] phenazopyridine (PYRIDIUM) 100 MG tablet Take 1 tablet (100 mg total) by mouth 3 (three) times daily as needed for pain.   No facility-administered encounter medications on file as of 09/01/2019.      REVIEW OF SYSTEMS  : All other systems reviewed and negative except where noted in the History of Present Illness.   PHYSICAL EXAM: BP 134/70 (BP Location: Right Arm, Patient Position: Sitting, Cuff Size: Large)   Pulse 72   Temp 98.2 F (36.8 C)   Ht 5\' 5"  (1.651 m)   Wt 298 lb  4 oz (135.3 kg)   BMI 49.63 kg/m  General: Well developed black female in no acute distress Head: Normocephalic and atraumatic Eyes:  Sclerae anicteric, conjunctiva pink. Ears: Normal auditory acuity Lungs: Clear throughout to auscultation; no increased WOB. Heart: Regular rate and rhythm; no M/R/G. Abdomen: Soft, obese. non-distended.  BS present.  Non-tender. Rectal:  No external abnormalities noted.  DRE did not reveal any masses.  Small amount of light brown stool on the exam glove that was hemoccult negative.   Musculoskeletal: Symmetrical with no gross deformities  Skin: No lesions on visible extremities Extremities: No edema  Neurological: Alert oriented x 4, grossly non-focal Psychological:  Alert and cooperative. Normal mood and affect  ASSESSMENT AND PLAN: *Anemia: Hemoglobin has been chronically low, anywhere from 8.5 g to 11 g for at least the past 2 years.  This is a normocytic anemia.  Previously had complaints of rectal bleeding.  Last colonoscopy was 12 years ago.  Dr. Fuller Plan had previously tried to convince her to have another colonoscopy. *Left upper quadrant abdominal pain with reports of black stool.  She is Hemoccult negative today with light brown stool in the exam glove.  She says the stools did start to clear up this morning and that  her pain has improved.  She has a history of ulcer disease.  Is on pantoprazole 40 mg daily. *IDDM:  Insulin will be adjusted prior to endoscopic procedure per protocol. Will resume normal dosing after procedure.  **I did convince patient to proceed with both EGD and colonoscopy with Dr. Fuller Plan.  I am going to increase her pantoprazole to 40 mg twice daily.  New prescription sent.  Will check CBC today.   CC:  Biagio Borg, MD

## 2019-09-01 NOTE — Progress Notes (Signed)
Reviewed and agree with management plan.  Mahmoud Blazejewski T. Jennessa Trigo, MD FACG [facility]  

## 2019-09-01 NOTE — Patient Instructions (Addendum)
If you are age 69 or older, your body mass index should be between 23-30. Your Body mass index is 49.63 kg/m. If this is out of the aforementioned range listed, please consider follow up with your Primary Care Provider.  If you are age 49 or younger, your body mass index should be between 19-25. Your Body mass index is 49.63 kg/m. If this is out of the aformentioned range listed, please consider follow up with your Primary Care Provider.    Increase Pantoprazole to twice daily. - New rx sent.   Your provider has requested that you go to the basement level for lab work before leaving today. Press "B" on the elevator. The lab is located at the first door on the left as you exit the elevator.    You have been scheduled for an endoscopy and colonoscopy. Please follow the written instructions given to you at your visit today. Please pick up your prep supplies at the pharmacy within the next 1-3 days. If you use inhalers (even only as needed), please bring them with you on the day of your procedure.  Thank you for choosing me and [facility].  Janett Billow Zehr-PA

## 2019-09-07 ENCOUNTER — Telehealth: Payer: Self-pay | Admitting: Gastroenterology

## 2019-09-07 DIAGNOSIS — E113312 Type 2 diabetes mellitus with moderate nonproliferative diabetic retinopathy with macular edema, left eye: Secondary | ICD-10-CM | POA: Diagnosis not present

## 2019-09-07 NOTE — Telephone Encounter (Signed)
Called pt at number provided and no answer and voice mail is full

## 2019-09-08 ENCOUNTER — Encounter: Payer: Self-pay | Admitting: Endocrinology

## 2019-09-08 ENCOUNTER — Ambulatory Visit (INDEPENDENT_AMBULATORY_CARE_PROVIDER_SITE_OTHER): Payer: Medicare Other | Admitting: Endocrinology

## 2019-09-08 ENCOUNTER — Other Ambulatory Visit: Payer: Self-pay

## 2019-09-08 VITALS — BP 140/88 | HR 75 | Ht 65.0 in | Wt 308.0 lb

## 2019-09-08 DIAGNOSIS — Z794 Long term (current) use of insulin: Secondary | ICD-10-CM

## 2019-09-08 DIAGNOSIS — E890 Postprocedural hypothyroidism: Secondary | ICD-10-CM | POA: Diagnosis not present

## 2019-09-08 DIAGNOSIS — E1165 Type 2 diabetes mellitus with hyperglycemia: Secondary | ICD-10-CM | POA: Diagnosis not present

## 2019-09-08 DIAGNOSIS — E119 Type 2 diabetes mellitus without complications: Secondary | ICD-10-CM

## 2019-09-08 DIAGNOSIS — N184 Chronic kidney disease, stage 4 (severe): Secondary | ICD-10-CM | POA: Diagnosis not present

## 2019-09-08 LAB — POCT GLYCOSYLATED HEMOGLOBIN (HGB A1C): Hemoglobin A1C: 7.3 % — AB (ref 4.0–5.6)

## 2019-09-08 LAB — BASIC METABOLIC PANEL
BUN: 16 mg/dL (ref 6–23)
CO2: 28 mEq/L (ref 19–32)
Calcium: 9.2 mg/dL (ref 8.4–10.5)
Chloride: 105 mEq/L (ref 96–112)
Creatinine, Ser: 1.38 mg/dL — ABNORMAL HIGH (ref 0.40–1.20)
GFR: 45.8 mL/min — ABNORMAL LOW (ref >60.00)
Glucose, Bld: 185 mg/dL — ABNORMAL HIGH (ref 70–99)
Potassium: 4.7 mEq/L (ref 3.5–5.1)
Sodium: 137 mEq/L (ref 135–145)

## 2019-09-08 LAB — T4, FREE: Free T4: 0.81 ng/dL (ref 0.60–1.60)

## 2019-09-08 LAB — TSH: TSH: 1.88 u[IU]/mL (ref 0.35–4.50)

## 2019-09-08 NOTE — Telephone Encounter (Signed)
The pt states she continues to have pain under her rib and wanted to know if we could prescribe pain medication.  I advised that we could not prescribe pain medication and confirmed that she is taking pantoprazole 40 mg BID and we discussed that she needs to take it 20-30 min prior to meals.  She was also advised she could use some gas x.  She should keep her appt as planned for her procedures.  The pt has been advised of the information and verbalized understanding.

## 2019-09-08 NOTE — Progress Notes (Signed)
[name] is a 68 y.o. female.    Reason for Appointment: Diabetes follow-up   History of Present Illness   Diagnosis: Type 2 DIABETES MELITUS, date of diagnosis: 2000  Previous history: She had initially been treated with metformin and Amaryl and subsequently given Victoza which helped overall control and weight loss. In 9/13 because of significant hyperglycemia she was started on basal bolus insulin also. Had required relatively small doses of insulin. Her blood sugar control has been excellent with A1c between 5.3-6.0 although this may be relatively higher than expected. Tends to have relatively higher readings after supper In 6/15 she was told to resume her Victoza and stop her Lantus since she had been gaining weight with Lantus and NovoLog regimen.   Recent history:     INSULIN regimen: Novolog 7-8 units before lunch and 10 units before dinner,   Non-insulin hypoglycemic drugs: Soliqua 26 units daily   Her A1c is 7.3   Current blood sugar patterns and management:  She was told to check her sugars with the Accu-Chek at the same time as her freestyle libre to compare but has not done so  As before likely has falsely low readings with the freestyle libre   She did not have any blood sugar data on her libre download at the time of her lab blood sugar draw to compare  As before she is tending to have overnight hypoglycemia and mostly high readings postprandially after meals  She was told to reduce her Soliqua to 22 units but she is taking 26  Also has not increased her dose of NovoLog even though her sugars are frequently high both before and after dinner  Most significant hypoglycemia is occurring after dinner with occasional blood sugars nearly 350  Also some of her hypoglycemia is at inconsistent times during the day  Low blood sugars are documented about 5 to 6 hours after her evening NovoLog even though she thinks she is taking her NovoLog before  starting to eat  As before eating mostly 2 meals a day before noon and about 8 PM  She has been told to take her [place] on waking up and she thinks she is doing so    Side effects from medications:  none  Monitors blood glucose:   Glucometer:  Freestyle Libre/Accu-Chek .       CGM use % of time  62  2-week average/SD  160+/-40  Time in range     51   %  % Time Above 180  31  % Time above 250  7  % Time Below 70  11     PRE-MEAL Fasting Lunch Dinner  12-2 AM Overall  Glucose range:       Averages:  128   210  85  160   POST-MEAL PC Breakfast PC Lunch PC Dinner  Glucose range:     Averages:   195  114     Previous data:  CGM use % of time  53  Average and SD  151, GV 41   Time in range    55    %  % Time Above 180  28  % Time above 250  7  % Time Below target  10      Meals: 1-2 meals per day. Lunch 12 Supper at 5 PM            Physical activity: exercise: Limited, has had fatigue, back and leg pain.  Dietician visit: Most recent: 05/2706            Complications: are: None  Wt Readings from Last 3 Encounters:  09/08/19 (!) 308 lb (139.7 kg)  09/01/19 298 lb 4 oz (135.3 kg)  08/23/19 287 lb (130.2 kg)    LABS:  Lab Results  Component Value Date   HGBA1C 7.3 (A) 09/08/2019   HGBA1C 7.0 (A) 05/12/2019   HGBA1C 7.6 (H) 02/08/2019   Lab Results  Component Value Date   MICROALBUR <0.7 04/22/2019   [place] 85 06/15/2018   CREATININE 1.38 (H) 09/08/2019    Other active problems: See review of systems   Office Visit on 09/08/2019  Component Date Value Ref Range Status   Hemoglobin A1C 09/08/2019 7.3* 4.0 - 5.6 % Final   Free T4 09/08/2019 0.81  0.60 - 1.60 ng/dL Final   Comment: Specimens from patients who are undergoing biotin therapy and /or ingesting biotin supplements may contain high levels of biotin.  The higher biotin concentration in these specimens interferes with this Free T4 assay.  Specimens that contain high levels  of  biotin may cause false high results for this Free T4 assay.  Please interpret results in light of the total clinical presentation of the patient.     TSH 09/08/2019 1.88  0.35 - 4.50 uIU/mL Final   Sodium 09/08/2019 137  135 - 145 mEq/L Final   Potassium 09/08/2019 4.7  3.5 - 5.1 mEq/L Final   Chloride 09/08/2019 105  96 - 112 mEq/L Final   CO2 09/08/2019 28  19 - 32 mEq/L Final   Glucose, Bld 09/08/2019 185* 70 - 99 mg/dL Final   BUN 09/08/2019 16  6 - 23 mg/dL Final   Creatinine, Ser 09/08/2019 1.38* 0.40 - 1.20 mg/dL Final   GFR 09/08/2019 45.80* >60.00 mL/min Final   Calcium 09/08/2019 9.2  8.4 - 10.5 mg/dL Final    Allergies as of 09/08/2019      Reactions   Ciprofloxacin Shortness Of Breath, Other (See Comments)   Dizziness   Hydrocodone Shortness Of Breath, Other (See Comments)   Tachycardia   Penicillins Hives   Pt just remembers hives Has patient had a PCN reaction causing immediate rash, facial/tongue/throat swelling, SOB or lightheadedness with hypotension: No Has patient had a PCN reaction causing severe rash involving mucus membranes or skin necrosis: No Has patient had a PCN reaction that required hospitalization No Has patient had a PCN reaction occurring within the last 10 years: No If all of the above answers are "NO", then may proceed with Cephalosporin use.      Medication List       Accurate as of September 08, 2019  8:57 PM. If you have any questions, ask your nurse or doctor.        aspirin 81 MG EC tablet Take 81 mg by mouth every evening.   atorvastatin 40 MG tablet Commonly known as: LIPITOR Take 1 tablet (40 mg total) by mouth at bedtime.   buPROPion 150 MG 24 hr tablet Commonly known as: Wellbutrin XL Take 1 tablet (150 mg total) by mouth daily.   cloNIDine 0.1 MG tablet Commonly known as: CATAPRES TAKE ONE TABLET BY MOUTH TWICE A DAY   Elmiron 100 MG capsule Generic drug: pentosan polysulfate Take 200 mg by mouth 2 (two) times  daily.   FLUoxetine 20 MG capsule Commonly known as: PROZAC TAKE 1 CAPSULE BY MOUTH DAILY   FreeStyle Libre 14 Day Reader Kerrin Mo by Does not  apply route.   gabapentin 300 MG capsule Commonly known as: NEURONTIN Take 300 mg by mouth 3 (three) times daily. What changed: Another medication with the same name was removed. Continue taking this medication, and follow the directions you see here. Changed by: Elayne Snare, MD   glucose blood test strip Commonly known as: FREESTYLE TEST STRIPS Use as instructed to check blood sugar 4 times daily.   hydrOXYzine 10 MG tablet Commonly known as: ATARAX/VISTARIL   levothyroxine 125 MCG tablet Commonly known as: SYNTHROID 1 tablet daily and extra half tablet on Sundays   linaclotide 72 MCG capsule Commonly known as: Linzess Take 1 capsule (72 mcg total) by mouth daily before breakfast.   losartan 100 MG tablet Commonly known as: COZAAR TAKE 1 TABLET(100 MG) BY MOUTH DAILY   metoprolol succinate 50 MG 24 hr tablet Commonly known as: TOPROL-XL TAKE 1 TABLET BY MOUTH DAILY WITH OR IMMEDIATELY FOLLOWING A MEAL   naltrexone 50 MG tablet Commonly known as: DEPADE Take 1 tablet (50 mg total) by mouth daily.   NovoLOG FlexPen 100 UNIT/ML FlexPen Generic drug: insulin aspart Inject 5-10 Units into the skin 3 (three) times daily with meals. Inject 5-10 units under the skin three times daily before meals. What changed: Another medication with the same name was removed. Continue taking this medication, and follow the directions you see here. Changed by: Elayne Snare, MD   pantoprazole 40 MG tablet Commonly known as: PROTONIX Take 1 tablet (40 mg total) by mouth 2 (two) times daily. Pharmacy-please d/c rx for 30 day supply   promethazine 25 MG tablet Commonly known as: PHENERGAN Take 1 tablet (25 mg total) by mouth every 8 (eight) hours as needed for nausea or vomiting.   Soliqua 100-33 UNT-MCG/ML Sopn Generic drug: Insulin  Glargine-Lixisenatide Inject 26 Units into the skin daily with breakfast.   Suprep Bowel Prep Kit 17.5-3.13-1.6 GM/177ML Soln Generic drug: Na Sulfate-K Sulfate-Mg Sulf Take 1 kit by mouth as directed. For colonoscopy prep   traMADol 50 MG tablet Commonly known as: ULTRAM Take 1 tablet (50 mg total) by mouth every 6 (six) hours as needed.       Allergies:  Allergies  Allergen Reactions   Ciprofloxacin Shortness Of Breath and Other (See Comments)    Dizziness    Hydrocodone Shortness Of Breath and Other (See Comments)    Tachycardia   Penicillins Hives    Pt just remembers hives Has patient had a PCN reaction causing immediate rash, facial/tongue/throat swelling, SOB or lightheadedness with hypotension: No Has patient had a PCN reaction causing severe rash involving mucus membranes or skin necrosis: No Has patient had a PCN reaction that required hospitalization No Has patient had a PCN reaction occurring within the last 10 years: No If all of the above answers are "NO", then may proceed with Cephalosporin use.     Past Medical History:  Diagnosis Date   Anxiety    Arthritis    Blood dyscrasia    "FREE BLEEDER"   CERVICAL RADICULOPATHY, LEFT    Chronic back pain    CKD (chronic kidney disease)    DR. SANFORD  [facility]   COMMON MIGRAINE    CORONARY ARTERY DISEASE    Cough    CURRENT COLD   Cystitis    DEPRESSION    Diabetes mellitus, type II ([place])    DIVERTICULOSIS-COLON    Dysrhythmia    palpitations   Gastric ulcer 04/2008   Gastroparesis    GERD (gastroesophageal  reflux disease)    Hiatal hernia    Hyperlipidemia    Hypertension    Hypothyroidism    INSOMNIA-SLEEP DISORDER-UNSPEC    Iron deficiency anemia    Wears glasses     Past Surgical History:  Procedure Laterality Date   ABDOMINAL HYSTERECTOMY     ANTERIOR LAT LUMBAR FUSION Left 08/13/2017   Procedure: LEFT SIDED LUMBAR 3-4 LATERAL INTERBODY FUSION WITH  INSTRUMENTATION AND ALLOGRAFT;  Surgeon: Phylliss Bob, MD;  Location: [facility];  Service: Orthopedics;  Laterality: Left;  LEFT SIDED LUMBAR 3-4 LATERAL INTERBODY FUSION WITH INSTRUMENTATION AND ALLOGRAFT; REQUEST 3 HOURS   Back Stimulator  07/2018   BACK SURGERY  03/2016   BREAST EXCISIONAL BIOPSY Right    40 years ago   CHOLECYSTECTOMY  06/2009   COLONOSCOPY     ESOPHAGOGASTRODUODENOSCOPY     EYE SURGERY Bilateral    lasik   Gastric Wedge resection lipoma  11/2007   x2 with laparotomy and gastrostomy   JOINT REPLACEMENT     Left knee replacement     Rigth knee replacement with revision  04/2008   Dr. Berenice Primas   ROTATOR CUFF REPAIR Left 01/2009   s/p bladder surgury  09/2009   Dr. Terance Hart   SPINAL CORD STIMULATOR INSERTION N/A 09/11/2018   Procedure: LUMBAR SPINAL CORD STIMULATOR INSERTION;  Surgeon: Clydell Hakim, MD;  Location: [facility];  Service: Neurosurgery;  Laterality: N/A;  LUMBAR SPINAL CORD STIMULATOR INSERTION    Family History  Problem Relation Age of Onset   Diabetes Sister        x 3   Heart disease Sister        x2   Breast cancer Sister 69   Diabetes Brother        x3   Heart disease Brother        x2   Coronary artery disease Other        female 1st degree   Hypertension Other    Breast cancer Sister 81   Breast cancer Other 25   Diabetes Brother    Colon cancer Neg Hx     Social History:  reports that she has quit smoking. She has never used smokeless tobacco. She reports that she does not drink alcohol or use drugs.  Review of Systems:  Hypertension: Her blood pressure is treated with losartan 100 mg and clonidine Followed by nephrologist and PCP  CKD: Her creatinine is variable, followed by nephrologist  Potassium is high on previous labs  Lab Results  Component Value Date   CREATININE 1.38 (H) 09/08/2019   CREATININE 2.14 (H) 06/21/2019   CREATININE 1.60 (H) 04/26/2019   CREATININE 1.74 (H) 04/22/2019   Lab Results    Component Value Date   K 4.7 09/08/2019     Lipids: Followed by PCP, LDL has been controlled  Taking Lipitor 40 mg with results as follows  Lab Results  Component Value Date   CHOL 150 06/15/2018   HDL 50.00 06/15/2018   LDLCALC 85 06/15/2018   LDLDIRECT 156.6 01/07/2011   TRIG 77.0 06/15/2018   CHOLHDL 3 06/15/2018     Post ablative hypothyroidism:  Previously she had I-131 treatment on 08/20/11 for Graves' disease  Her dose has been adjusted periodically  She has been recently taking 125 mg levothyroxine prescription consistently and has been taking 1-1/2 tablets  on Saturdays and 1 pill on the other days On her last visit she was apparently taking the opposite regimen with 1-1/2 tabs daily  and 1 tablet on Saturday   TSH from today not available  Lab Results  Component Value Date   TSH 1.88 09/08/2019   TSH 1.01 06/21/2019   TSH 0.58 04/22/2019   FREET4 0.81 09/08/2019   FREET4 0.95 06/21/2019   FREET4 0.86 04/22/2019    On Gabapentin for neuropathy    Examination:   BP 140/88 (BP Location: Left Arm, Patient Position: Sitting, Cuff Size: Large)    Pulse 75    Ht 5' 5"  (1.651 m)    Wt (!) 308 lb (139.7 kg)    SpO2 98%    BMI 51.25 kg/m   Body mass index is 51.25 kg/m.     ASSESSMENT/ PLAN:   Diabetes type 2,  with obesity:   See history of present illness for detailed discussion of current management, blood sugar patterns and problems identified  A1C is recently stable at 7.3  However her blood sugars show marked fluctuation and level of control is not better Currently only using freestyle libre which is readings somewhat lower than actual readings Again has marked hyperglycemia after meals but at variable times Blood sugars show a persistently high reading after noon until late evening  However she has a tendency to dropping sugars around 12-2 AM She thinks she is taking her evening NovoLog at 8 PM but not clear why sugars drop after midnight She  has been told to reduce Soliqua because of nocturnal hypoglycemia, last night was symptomatic with this    Recommendations for diabetes:  She will try Humalog mix insulin instead of NovoLog to help with daytime hyperglycemia and start with 12 units  Given details on how to titrate this  She will to decrease her Soliqua to 18 units instead of 26  To take at higher doses of NovoLog at suppertime if postprandial readings are going up, also needs to make sure she takes it before starting to eat  Discussed the differences between 3 injections that she will be taking  She will use her Accu-Chek monitor also to confirm her sensor readings  Likely needs a bedtime snack also  Discussed trying to get an insurance plan with Medicaid that will cover her diabetes regimen    Hypothyroidism: Taking 125 mcg prescription However she is not getting consistent answers about how she has been taking this compared to her last visit and will therefore need repeat TSH today  Hypertension: Fairly well controlled  Weight gain: Does not appear to be from edema  Hyperkalemia: Needs follow-up  CKD: Recheck levels today  Total visit time for evaluation and management of multiple problems and counseling =25 minutes      Patient Instructions  Check before and 2 hrs after every meal  Use accucheck also  SOLIQUA 18 units only on waking up   HUMALOG MIX 12 AT lunch time and go up or down 2 units to get BEFORE SUPPER SUGAR 120-140 RANGE   nOVOLOG 10-14 AT supper only      Elayne Snare 09/08/2019, 8:57 PM   Note: This office note was prepared with Dragon voice recognition system technology. Any transcriptional errors that result from this process are unintentional.

## 2019-09-08 NOTE — Patient Instructions (Addendum)
Check before and 2 hrs after every meal  Use accucheck also  SOLIQUA 18 units only on waking up   HUMALOG MIX 12 AT lunch time and go up or down 2 units to get BEFORE SUPPER SUGAR 120-140 RANGE   nOVOLOG 10-14 AT supper only

## 2019-09-08 NOTE — Progress Notes (Signed)
Please call to let patient know that the kidney and potassium results are improved.  Thyroid okay and will continue 1 tablet of 125 mcg 6 days a week and 1-1/2 on Saturday

## 2019-09-09 ENCOUNTER — Ambulatory Visit (INDEPENDENT_AMBULATORY_CARE_PROVIDER_SITE_OTHER): Payer: Medicare Other | Admitting: Podiatry

## 2019-09-09 ENCOUNTER — Encounter: Payer: Self-pay | Admitting: Podiatry

## 2019-09-09 ENCOUNTER — Other Ambulatory Visit: Payer: Self-pay

## 2019-09-09 DIAGNOSIS — M76822 Posterior tibial tendinitis, left leg: Secondary | ICD-10-CM

## 2019-09-09 LAB — FRUCTOSAMINE: Fructosamine: 369 umol/L — ABNORMAL HIGH (ref 0–285)

## 2019-09-10 NOTE — Progress Notes (Signed)
Subjective:   Patient ID: [name], female   DOB: 68 y.o.   MRN: [id number]   HPI Patient states still having quite a bit of pain in the inside of the ankle and had some temporary relief but for most part the pain has returned as was   ROS      Objective:  Physical Exam  Neurovascular status intact with moderate collapse of medial longitudinal arch left with inflammation pain of the posterior tibial tendon as it comes underneath the medial malleolus with obesity is complicating factor     Assessment:  Severe flatfoot deformity with obesity and posterior tibial tendinitis left     Plan:  H&P reviewed condition and discussed complete immobilization to try to take pressure off the tendon.  Patient wants this done and I first did 1 more injection of the posterior tibial tendon 3 mg dexamethasone Kenalog 5 mg Xylocaine and then applied a air fracture walker to completely immobilize.  Gave advice on at least 2 weeks of wearing this full-time with gradual reduction in supportive shoes and reappoint 4 weeks

## 2019-09-13 ENCOUNTER — Ambulatory Visit: Payer: Medicare Other | Admitting: Gastroenterology

## 2019-09-13 ENCOUNTER — Encounter: Payer: Self-pay | Admitting: Gastroenterology

## 2019-09-13 VITALS — Ht 65.0 in | Wt 293.6 lb

## 2019-09-13 DIAGNOSIS — Z1211 Encounter for screening for malignant neoplasm of colon: Secondary | ICD-10-CM

## 2019-09-13 NOTE — Progress Notes (Signed)
Pt came in for weight check today. Pt's weight was 293.6 today.

## 2019-09-15 ENCOUNTER — Other Ambulatory Visit: Payer: Self-pay | Admitting: Gastroenterology

## 2019-09-15 ENCOUNTER — Ambulatory Visit (INDEPENDENT_AMBULATORY_CARE_PROVIDER_SITE_OTHER): Payer: Medicare Other

## 2019-09-15 DIAGNOSIS — Z1159 Encounter for screening for other viral diseases: Secondary | ICD-10-CM

## 2019-09-16 LAB — SARS CORONAVIRUS 2 (TAT 6-24 HRS): SARS Coronavirus 2: NEGATIVE

## 2019-09-17 ENCOUNTER — Encounter: Payer: Medicare Other | Admitting: Gastroenterology

## 2019-09-17 ENCOUNTER — Telehealth: Payer: Self-pay

## 2019-09-17 NOTE — Telephone Encounter (Signed)
No charge this time. 

## 2019-09-17 NOTE — Telephone Encounter (Signed)
Patient left a message with answering service stating she wants to cancel EGD/Colon for today at 1:30pm. She states "she has been sick all night". FYI

## 2019-09-27 ENCOUNTER — Telehealth: Payer: Self-pay

## 2019-09-27 NOTE — Telephone Encounter (Signed)
Copied from [place] [phone]. Topic: General - Inquiry >> Sep 24, 2019  5:57 PM Alease Frame wrote: Reason for CRM: Stanton Kidney from [employer] call to confirm office received fax sent over on [phone]. Please advise   Call back [phone] Fax number [phone]

## 2019-09-27 NOTE — Telephone Encounter (Signed)
[place] with [facility] states she if [employer] for signature. States she has faxed numerous time in the last ten days.

## 2019-10-01 ENCOUNTER — Other Ambulatory Visit: Payer: Self-pay | Admitting: Gastroenterology

## 2019-10-04 ENCOUNTER — Telehealth: Payer: Self-pay

## 2019-10-04 NOTE — Telephone Encounter (Signed)
Copied from [place] [phone]. Topic: General - Other >> Oct 04, 2019  1:47 PM Rainey Pines A wrote: Waynetta Pean with [facility] is requesting a callback as soon as possible in regards to fax for prescription for patients medical supplies that has been sent multiple times since 12/10. Best contact is [phone].

## 2019-10-05 ENCOUNTER — Telehealth: Payer: Self-pay

## 2019-10-05 DIAGNOSIS — Z9689 Presence of other specified functional implants: Secondary | ICD-10-CM | POA: Insufficient documentation

## 2019-10-05 DIAGNOSIS — Z6841 Body Mass Index (BMI) 40.0 and over, adult: Secondary | ICD-10-CM | POA: Insufficient documentation

## 2019-10-05 DIAGNOSIS — M961 Postlaminectomy syndrome, not elsewhere classified: Secondary | ICD-10-CM | POA: Insufficient documentation

## 2019-10-05 NOTE — Telephone Encounter (Signed)
Faxed written order  To [facility].  Fax was confirmed.  Info scanned into chart

## 2019-10-05 NOTE — Telephone Encounter (Signed)
Called [place] at [facility] left message informing her that the information has been faxed this morning with confirmation

## 2019-10-06 ENCOUNTER — Encounter: Payer: Self-pay | Admitting: Podiatry

## 2019-10-06 ENCOUNTER — Other Ambulatory Visit: Payer: Self-pay

## 2019-10-06 ENCOUNTER — Ambulatory Visit (INDEPENDENT_AMBULATORY_CARE_PROVIDER_SITE_OTHER): Payer: Medicare Other | Admitting: Podiatry

## 2019-10-06 DIAGNOSIS — M76822 Posterior tibial tendinitis, left leg: Secondary | ICD-10-CM | POA: Diagnosis not present

## 2019-10-06 DIAGNOSIS — T148XXA Other injury of unspecified body region, initial encounter: Secondary | ICD-10-CM

## 2019-10-06 NOTE — Progress Notes (Signed)
Subjective:   Patient ID: [name], female   DOB: 68 y.o.   MRN: [id number]   HPI Patient presents stating that she has had chronic tendinitis left and it simply not getting better with injections and also with immobilization with boot   ROS      Objective:  Physical Exam  Neurovascular status with obese female who has quite a bit of edema in the left ankle with negative Bevelyn Buckles' sign and pain of the posterior tibial tendon as it comes under medial malleolus     Assessment:  Possibility that there is a tear of the posterior tibial tendon left     Plan:  H&P condition reviewed and recommended MRI to rule out a tear given its failure to respond so far to conservative treatment.  We will get the MRI done and then decide what might be appropriate

## 2019-10-11 NOTE — Telephone Encounter (Signed)
T[place], from [facility], called stating she received the fax for the pt but she is needing a diagnosis code filled out. Please advise.   Fax# [phone]

## 2019-10-12 ENCOUNTER — Telehealth: Payer: Self-pay

## 2019-10-12 NOTE — Telephone Encounter (Signed)
Faxed and confirmed requested info to [facility]

## 2019-10-12 NOTE — Telephone Encounter (Signed)
Faxed and confirmed order to [facility]

## 2019-10-14 ENCOUNTER — Telehealth: Payer: Self-pay

## 2019-10-14 NOTE — Telephone Encounter (Signed)
refaxed form to [facility] per Dr. Jenny Reichmann he stated he has filled out form before

## 2019-10-15 ENCOUNTER — Other Ambulatory Visit: Payer: Self-pay | Admitting: Endocrinology

## 2019-10-20 ENCOUNTER — Ambulatory Visit (INDEPENDENT_AMBULATORY_CARE_PROVIDER_SITE_OTHER): Payer: Medicare Other | Admitting: Endocrinology

## 2019-10-20 ENCOUNTER — Encounter: Payer: Self-pay | Admitting: Endocrinology

## 2019-10-20 ENCOUNTER — Other Ambulatory Visit: Payer: Self-pay

## 2019-10-20 VITALS — BP 170/90 | HR 80 | Ht 65.0 in | Wt 297.6 lb

## 2019-10-20 DIAGNOSIS — E890 Postprocedural hypothyroidism: Secondary | ICD-10-CM | POA: Diagnosis not present

## 2019-10-20 DIAGNOSIS — E1165 Type 2 diabetes mellitus with hyperglycemia: Secondary | ICD-10-CM | POA: Diagnosis not present

## 2019-10-20 DIAGNOSIS — Z794 Long term (current) use of insulin: Secondary | ICD-10-CM

## 2019-10-20 DIAGNOSIS — E785 Hyperlipidemia, unspecified: Secondary | ICD-10-CM

## 2019-10-20 MED ORDER — INSULIN LISPRO PROT & LISPRO (75-25 MIX) 100 UNIT/ML KWIKPEN: 20.0000 [IU] | PEN_INJECTOR | Freq: Every day | SUBCUTANEOUS | 11 refills | Status: DC

## 2019-10-20 NOTE — Progress Notes (Signed)
[name] is a 69 y.o. female.    Reason for Appointment: Diabetes follow-up   History of Present Illness   Diagnosis: Type 2 DIABETES MELITUS, date of diagnosis: 2000  Previous history: She had initially been treated with metformin and Amaryl and subsequently given Victoza which helped overall control and weight loss. In 9/13 because of significant hyperglycemia she was started on basal bolus insulin also. Had required relatively small doses of insulin. Her blood sugar control has been excellent with A1c between 5.3-6.0 although this may be relatively higher than expected. Tends to have relatively higher readings after supper In 6/15 she was told to resume her Victoza and stop her Lantus since she had been gaining weight with Lantus and NovoLog regimen.   Recent history:     INSULIN regimen: Novolog 15-20 units before lunch and 10 units before dinner, Humalog mix 7-10 acs   Non-insulin hypoglycemic drugs: Soliqua 26 units daily   Her A1c was last 7.3   Current blood sugar patterns and management:  She was told to take Humalog mix before BREAKFAST which she is taking it before dinner  With this she has had a couple of episodes of low sugars early morning on her freestyle libre  She has checked her blood sugar with the Accu-Chek and she thinks that the readings are similar to the freestyle libre but only on the recent sensor  However again her monitoring is limited and she has useful information from the freestyle libre only 44% of the time in the last 2 weeks  Previously her fasting readings would be fairly good with average 128 and now they are frequently over 200 even on her Accu-Chek  This is despite her not reducing her Soliqua to 18 units instead of 26 as recommended  She thinks that she has had more stress for various reasons including death in the family  Also blood sugars late at night after supper and significantly higher, highest reading 519 at bedtime   She takes her NovoLog right after eating instead of before  As before not able to exercise  Previously Metformin was stopped because of renal dysfunction    Side effects from medications:  none  Monitors blood glucose:   Glucometer:  Freestyle Libre/Accu-Chek .       CGM use % of time  44  2-week average/SD  204, GV 35  Time in range  34     %, was 51  % Time Above 180  33  % Time above 250  29  % Time Below 70 4     PRE-MEAL Fasting Lunch Dinner Bedtime Overall  Glucose range:    224    Averages:  163     204   POST-MEAL PC Breakfast PC Lunch PC Dinner  Glucose range:     Averages:  213   276    PREVIOUS data:  CGM use % of time  62  2-week average/SD  160+/-40  Time in range     51   %  % Time Above 180  31  % Time above 250  7  % Time Below 70  11     PRE-MEAL Fasting Lunch Dinner  12-2 AM Overall  Glucose range:       Averages:  128   210  85  160   POST-MEAL PC Breakfast PC Lunch PC Dinner  Glucose range:     Averages:   195  114  Meals: 1-2 meals per day. Lunch 12 Supper at 5 PM            Physical activity: exercise: Limited, has had fatigue, back and leg pain.            Dietician visit: Most recent: 06/9356            Complications: are: None  Wt Readings from Last 3 Encounters:  10/20/19 297 lb 9.6 oz (135 kg)  09/13/19 293 lb 9.6 oz (133.2 kg)  09/08/19 (!) 308 lb (139.7 kg)    LABS:  Lab Results  Component Value Date   HGBA1C 7.3 (A) 09/08/2019   HGBA1C 7.0 (A) 05/12/2019   HGBA1C 7.6 (H) 02/08/2019   Lab Results  Component Value Date   MICROALBUR <0.7 04/22/2019   LDLCALC 85 06/15/2018   CREATININE 1.38 (H) 09/08/2019   Lab Results  Component Value Date   FRUCTOSAMINE 369 (H) 09/08/2019   FRUCTOSAMINE 364 (H) 06/21/2019   FRUCTOSAMINE 350 (H) 11/02/2018     Other active problems: See review of systems   No visits with results within 1 Week(s) from this visit.  Latest known visit with results is:  Orders Only on  09/15/2019  Component Date Value Ref Range Status  . SARS Coronavirus 2 09/15/2019 RESULT:  NEGATIVE   Final   Comment: RESULT:  NEGATIVESARS-CoV-2 INTERPRETATION:A NEGATIVE  test result means that SARS-CoV-2 RNA was not present in the specimen above the limit of detection of this test. This does not preclude a possible SARS-CoV-2 infection and should not be used as the  sole basis for patient management decisions. Negative results must be combined with clinical observations, patient history, and epidemiological information. Optimum specimen types and timing for peak viral levels during infections caused by SARS-CoV-2  have not been determined. Collection of multiple specimens or types of specimens may be necessary to detect virus. Improper specimen collection and handling, sequence variability under primers/probes, or organism present below the limit of detection may  lead to false negative results. Positive and negative predictive values of testing are highly dependent on prevalence. False negative test results are more likely when prevalence of disease is high.The expected result is NEGATIVE.Fact                           Sheet for  Healthcare Providers: https://www.woods-mathews.com/.Fact Sheet for Patients: SugarRoll.be.Normal Reference Range - Negative     Allergies as of 10/20/2019      Reactions   Ciprofloxacin Shortness Of Breath, Other (See Comments)   Dizziness   Hydrocodone Shortness Of Breath, Other (See Comments)   Tachycardia   Penicillins Hives   Pt just remembers hives Has patient had a PCN reaction causing immediate rash, facial/tongue/throat swelling, SOB or lightheadedness with hypotension: No Has patient had a PCN reaction causing severe rash involving mucus membranes or skin necrosis: No Has patient had a PCN reaction that required hospitalization No Has patient had a PCN reaction occurring within the last 10 years: No If all of the  above answers are "NO", then may proceed with Cephalosporin use.      Medication List       Accurate as of October 20, 2019  3:32 PM. If you have any questions, ask your nurse or doctor.        aspirin 81 MG EC tablet Take 81 mg by mouth every evening.   atorvastatin 40 MG tablet Commonly known as: LIPITOR Take 1 tablet (  40 mg total) by mouth at bedtime.   buPROPion 150 MG 24 hr tablet Commonly known as: Wellbutrin XL Take 1 tablet (150 mg total) by mouth daily.   cloNIDine 0.1 MG tablet Commonly known as: CATAPRES TAKE ONE TABLET BY MOUTH TWICE A DAY   Elmiron 100 MG capsule Generic drug: pentosan polysulfate Take 200 mg by mouth 2 (two) times daily.   FLUoxetine 20 MG capsule Commonly known as: PROZAC TAKE 1 CAPSULE BY MOUTH DAILY   FreeStyle Libre 14 Day Reader Kerrin Mo by Does not apply route.   gabapentin 300 MG capsule Commonly known as: NEURONTIN Take 300 mg by mouth 3 (three) times daily.   glucose blood test strip Commonly known as: FREESTYLE TEST STRIPS Use as instructed to check blood sugar 4 times daily.   hydrOXYzine 10 MG tablet Commonly known as: ATARAX/VISTARIL   levothyroxine 125 MCG tablet Commonly known as: SYNTHROID 1 tablet daily and extra half tablet on Sundays   linaclotide 72 MCG capsule Commonly known as: Linzess Take 1 capsule (72 mcg total) by mouth daily before breakfast.   losartan 100 MG tablet Commonly known as: COZAAR TAKE 1 TABLET(100 MG) BY MOUTH DAILY   metoprolol succinate 50 MG 24 hr tablet Commonly known as: TOPROL-XL TAKE 1 TABLET BY MOUTH DAILY WITH OR IMMEDIATELY FOLLOWING A MEAL   naltrexone 50 MG tablet Commonly known as: DEPADE Take 1 tablet (50 mg total) by mouth daily.   NovoLOG FlexPen 100 UNIT/ML FlexPen Generic drug: insulin aspart Inject 5-10 Units into the skin 3 (three) times daily with meals. Inject 5-10 units under the skin three times daily before meals.   pantoprazole 40 MG tablet Commonly  known as: PROTONIX TAKE 1 TABLET(40 MG) BY MOUTH TWICE DAILY   promethazine 25 MG tablet Commonly known as: PHENERGAN Take 1 tablet (25 mg total) by mouth every 8 (eight) hours as needed for nausea or vomiting.   Soliqua 100-33 UNT-MCG/ML Sopn Generic drug: Insulin Glargine-Lixisenatide Inject 26 Units into the skin daily with breakfast.   Suprep Bowel Prep Kit 17.5-3.13-1.6 GM/177ML Soln Generic drug: Na Sulfate-K Sulfate-Mg Sulf Take 1 kit by mouth as directed. For colonoscopy prep   traMADol 50 MG tablet Commonly known as: ULTRAM Take 1 tablet (50 mg total) by mouth every 6 (six) hours as needed.       Allergies:  Allergies  Allergen Reactions  . Ciprofloxacin Shortness Of Breath and Other (See Comments)    Dizziness   . Hydrocodone Shortness Of Breath and Other (See Comments)    Tachycardia  . Penicillins Hives    Pt just remembers hives Has patient had a PCN reaction causing immediate rash, facial/tongue/throat swelling, SOB or lightheadedness with hypotension: No Has patient had a PCN reaction causing severe rash involving mucus membranes or skin necrosis: No Has patient had a PCN reaction that required hospitalization No Has patient had a PCN reaction occurring within the last 10 years: No If all of the above answers are "NO", then may proceed with Cephalosporin use.     Past Medical History:  Diagnosis Date  . Anxiety   . Arthritis   . Blood dyscrasia    "FREE BLEEDER"  . CERVICAL RADICULOPATHY, LEFT   . Chronic back pain   . CKD (chronic kidney disease)    DR. SANFORD  [facility]  . COMMON MIGRAINE   . CORONARY ARTERY DISEASE   . Cough    CURRENT COLD  . Cystitis   . DEPRESSION   .  Diabetes mellitus, type II ([place])   . DIVERTICULOSIS-COLON   . Dysrhythmia    palpitations  . Gastric ulcer 04/2008  . Gastroparesis   . GERD (gastroesophageal reflux disease)   . Hiatal hernia   . Hyperlipidemia   . Hypertension   . Hypothyroidism   .  INSOMNIA-SLEEP DISORDER-UNSPEC   . Iron deficiency anemia   . Wears glasses     Past Surgical History:  Procedure Laterality Date  . ABDOMINAL HYSTERECTOMY    . ANTERIOR LAT LUMBAR FUSION Left 08/13/2017   Procedure: LEFT SIDED LUMBAR 3-4 LATERAL INTERBODY FUSION WITH INSTRUMENTATION AND ALLOGRAFT;  Surgeon: Phylliss Bob, MD;  Location: [facility];  Service: Orthopedics;  Laterality: Left;  LEFT SIDED LUMBAR 3-4 LATERAL INTERBODY FUSION WITH INSTRUMENTATION AND ALLOGRAFT; REQUEST 3 HOURS  . Back Stimulator  07/2018  . BACK SURGERY  03/2016  . BREAST EXCISIONAL BIOPSY Right    40 years ago  . CHOLECYSTECTOMY  06/2009  . COLONOSCOPY    . ESOPHAGOGASTRODUODENOSCOPY    . EYE SURGERY Bilateral    lasik  . Gastric Wedge resection lipoma  11/2007   x2 with laparotomy and gastrostomy  . JOINT REPLACEMENT    . Left knee replacement    . Rigth knee replacement with revision  04/2008   Dr. Berenice Primas  . ROTATOR CUFF REPAIR Left 01/2009  . s/p bladder surgury  09/2009   Dr. Terance Hart  . SPINAL CORD STIMULATOR INSERTION N/A 09/11/2018   Procedure: LUMBAR SPINAL CORD STIMULATOR INSERTION;  Surgeon: Clydell Hakim, MD;  Location: [facility];  Service: Neurosurgery;  Laterality: N/A;  LUMBAR SPINAL CORD STIMULATOR INSERTION    Family History  Problem Relation Age of Onset  . Diabetes Sister        x 3  . Heart disease Sister        x2  . Breast cancer Sister 75  . Diabetes Brother        x3  . Heart disease Brother        x2  . Coronary artery disease Other        female 1st degree  . Hypertension Other   . Breast cancer Sister 25  . Breast cancer Other 25  . Diabetes Brother   . Colon cancer Neg Hx     Social History:  reports that she has quit smoking. She has never used smokeless tobacco. She reports that she does not drink alcohol or use drugs.  Review of Systems:  Hypertension: Her blood pressure is treated with losartan 100 mg and clonidine Home Bp140/88 Followed by nephrologist and PCP   BP Readings from Last 3 Encounters:  10/20/19 (!) 170/90  09/08/19 140/88  09/01/19 134/70     CKD: Her creatinine is variable, followed by nephrologist  Potassium is high on previous labs  Lab Results  Component Value Date   CREATININE 1.38 (H) 09/08/2019   CREATININE 2.14 (H) 06/21/2019   CREATININE 1.60 (H) 04/26/2019   CREATININE 1.74 (H) 04/22/2019   Lab Results  Component Value Date   K 4.7 09/08/2019     Lipids: Followed by PCP, LDL has been controlled  Taking Lipitor 40 mg with results as follows  Lab Results  Component Value Date   CHOL 150 06/15/2018   HDL 50.00 06/15/2018   LDLCALC 85 06/15/2018   LDLDIRECT 156.6 01/07/2011   TRIG 77.0 06/15/2018   CHOLHDL 3 06/15/2018     Post ablative hypothyroidism:  Previously she had I-131 treatment on 08/20/11 for  Graves' disease  Her dose has been adjusted periodically  She has been recently taking 125 mg levothyroxine prescription consistently and has been taking  2 tablets  on Saturdays and 1 pill on the other days On her last visit she was apparently taking the opposite regimen with 1-1/2 tabs daily and 1 tablet on Saturday   TSH from today not available  Lab Results  Component Value Date   TSH 1.88 09/08/2019   TSH 1.01 06/21/2019   TSH 0.58 04/22/2019   FREET4 0.81 09/08/2019   FREET4 0.95 06/21/2019   FREET4 0.86 04/22/2019    On Gabapentin for neuropathy    Examination:   BP (!) 170/90 (BP Location: Left Arm, Patient Position: Sitting, Cuff Size: Large)   Pulse 80   Ht _0  (1.651 m)   Wt 297 lb 9.6 oz (135 kg)   SpO2 97%   BMI 49.52 kg/m   Body mass index is 49.52 kg/m.     ASSESSMENT/ PLAN:   Diabetes type 2,  with obesity:   See history of present illness for detailed discussion of current management, blood sugar patterns and problems identified  A1C is last 7.3  However her blood sugars have gone up since her last visit about a month ago Not clear why blood sugars are  higher even though she is taking the same or more insulin compared to her last visit As before she does not always follow instructions for insulin doses or timing She is afraid of low sugars overnight but instead of taking her premixed insulin in the morning she is taking it and suppertime with occasional hypoglycemia early morning As before blood sugars are markedly higher between noon and midnight and likely postprandial  She likes to the freestyle [place] but is using this very infrequently and incomplete data available However even with her Accu-Chek meter her blood sugars are only rarely below 200 and as high as 519  She has been told to reduce Soliqua because of nocturnal hypoglycemia but she did not change her dose    Recommendations for diabetes:  She was told to follow the written instructions for her insulin on her visit summary  Will take Humalog mix insulin in the morning and she will take this along with her NovoLog and [name]  For now will have her take 20 units because of the marked daytime hyperglycemia and adjust accordingly  She said that she can get this with the help of her family member and will not send a prescription as yet  She will still take NovoLog twice a day and take the higher dose of 15-20 minutes at dinnertime  Emphasized the need to check her blood sugar 4 times a day and periodically compare with Accu-Chek also  Call if having low sugars  We will reduce her [name] as a precaution against low sugars down to 20 units  If her creatinine is not unusually high we will also add Metformin ER 1000 mg daily   Hypothyroidism: Taking 125 mcg prescription However she is not consistently taking the same dose and now taking 8 tablets a week instead of 7-1/2 We will recheck her labs today  Hypertension: This appears poorly controlled and she thinks her diastolic is high at home also Since she has a Catapres patch 0.2 mg at home she will switch to this instead  of the 0.1 mg tablets Consider adding a diuretic although recently no edema   Follow-up in about 6 weeks  There are no  Patient Instructions on file for this visit.     Elayne Snare 10/20/2019, 3:32 PM   Note: This office note was prepared with Dragon voice recognition system technology. Any transcriptional errors that result from this process are unintentional.

## 2019-10-20 NOTE — Patient Instructions (Addendum)
Clonidine 0.2 patches weekly  SOLIQUA 20 UNITS AND 20 UNITS OF HUMALOG MIX PLUS NOVOLOG TO 8 UNITS AT 11 AM  NOVOLOG 15-20 AT SUPPER

## 2019-10-21 LAB — BASIC METABOLIC PANEL
BUN: 20 mg/dL (ref 6–23)
CO2: 26 mEq/L (ref 19–32)
Calcium: 9.6 mg/dL (ref 8.4–10.5)
Chloride: 104 mEq/L (ref 96–112)
Creatinine, Ser: 1.37 mg/dL — ABNORMAL HIGH (ref 0.40–1.20)
GFR: 46.17 mL/min — ABNORMAL LOW (ref >60.00)
Glucose, Bld: 161 mg/dL — ABNORMAL HIGH (ref 70–99)
Potassium: 4.2 mEq/L (ref 3.5–5.1)
Sodium: 139 mEq/L (ref 135–145)

## 2019-10-21 LAB — TSH: TSH: 0.51 u[IU]/mL (ref 0.35–4.50)

## 2019-10-21 LAB — FRUCTOSAMINE: Fructosamine: 401 umol/L — ABNORMAL HIGH (ref 0–285)

## 2019-10-21 NOTE — Progress Notes (Signed)
Please call to let patient know that the thyroid level is on the high side, instead of taking 2 pills on Saturday she will take 1-1/2 and on the other days 1 tablet levothyroxine.  Since kidney test is still good she can start Metformin ER 500 mg every morning for 7 days and then 1000 mg.  May need prescription

## 2019-10-26 ENCOUNTER — Telehealth: Payer: Self-pay | Admitting: Podiatry

## 2019-10-26 NOTE — Telephone Encounter (Signed)
Pt called to say that she was waiting on MRI to be scheduled wanting the update on that and a possible appt for the MRI.

## 2019-10-26 NOTE — Telephone Encounter (Signed)
I informed pt orders had been placed, but not faxed to [place], I had faxed and she could contact [phone] to schedule. Pt states understanding.

## 2019-10-27 NOTE — Telephone Encounter (Signed)
Ivin Poot, CMA states MRI [id number] left ankle does not need pre-cert through [facility]. Faxed orders to [facility].

## 2019-10-30 ENCOUNTER — Ambulatory Visit
Admission: RE | Admit: 2019-10-30 | Discharge: 2019-10-30 | Disposition: A | Discharge status: Home / Self Care | Payer: Medicare Other | Source: Ambulatory Visit | Attending: Podiatry | Admitting: Podiatry

## 2019-10-30 DIAGNOSIS — T148XXA Other injury of unspecified body region, initial encounter: Secondary | ICD-10-CM

## 2019-11-02 ENCOUNTER — Telehealth: Payer: Self-pay | Admitting: Podiatry

## 2019-11-02 DIAGNOSIS — T148XXA Other injury of unspecified body region, initial encounter: Secondary | ICD-10-CM

## 2019-11-02 DIAGNOSIS — M76822 Posterior tibial tendinitis, left leg: Secondary | ICD-10-CM

## 2019-11-02 DIAGNOSIS — M25572 Pain in left ankle and joints of left foot: Secondary | ICD-10-CM

## 2019-11-02 NOTE — Telephone Encounter (Signed)
Pt called to say that she had an MRI scheduled for 10/30/19 and it was canceled shes unable to because she has a stimulator in her back so the imaging center will not do the MRI. She wants to know what she needs to do now

## 2019-11-04 DIAGNOSIS — R102 Pelvic and perineal pain: Secondary | ICD-10-CM | POA: Diagnosis not present

## 2019-11-04 DIAGNOSIS — N3020 Other chronic cystitis without hematuria: Secondary | ICD-10-CM | POA: Diagnosis not present

## 2019-11-04 DIAGNOSIS — N3010 Interstitial cystitis (chronic) without hematuria: Secondary | ICD-10-CM | POA: Diagnosis not present

## 2019-11-04 NOTE — Addendum Note (Signed)
Addended by: Harriett Sine D on: 11/04/2019 09:27 AM   Modules accepted: Orders

## 2019-11-04 NOTE — Telephone Encounter (Signed)
Dr. Paulla Dolly ordered CT with and without contrast to left ankle [phone] for evaluation of torn posterior tibial tendon. Orders to K. Mavrakis, CMA for pre-cert, faxed to [facility].

## 2019-11-05 NOTE — Telephone Encounter (Signed)
Valery:  I called [employer] and this patient does not need a Prior Authorization for their CT Ankle Left W WO Contrast.  Ref # [id number]  Thank you,  Rolly Pancake, CMA (AAMA)

## 2019-11-13 ENCOUNTER — Other Ambulatory Visit: Payer: Self-pay | Admitting: Endocrinology

## 2019-11-21 ENCOUNTER — Other Ambulatory Visit: Payer: Self-pay | Admitting: Endocrinology

## 2019-11-22 DIAGNOSIS — E785 Hyperlipidemia, unspecified: Secondary | ICD-10-CM | POA: Diagnosis not present

## 2019-11-22 DIAGNOSIS — I25118 Atherosclerotic heart disease of native coronary artery with other forms of angina pectoris: Secondary | ICD-10-CM | POA: Diagnosis not present

## 2019-11-22 DIAGNOSIS — E119 Type 2 diabetes mellitus without complications: Secondary | ICD-10-CM | POA: Diagnosis not present

## 2019-11-22 DIAGNOSIS — N189 Chronic kidney disease, unspecified: Secondary | ICD-10-CM | POA: Diagnosis not present

## 2019-11-22 DIAGNOSIS — E1140 Type 2 diabetes mellitus with diabetic neuropathy, unspecified: Secondary | ICD-10-CM | POA: Diagnosis not present

## 2019-11-22 DIAGNOSIS — I10 Essential (primary) hypertension: Secondary | ICD-10-CM | POA: Diagnosis not present

## 2019-11-22 DIAGNOSIS — M1990 Unspecified osteoarthritis, unspecified site: Secondary | ICD-10-CM | POA: Diagnosis not present

## 2019-11-24 ENCOUNTER — Other Ambulatory Visit: Payer: Self-pay

## 2019-11-24 ENCOUNTER — Telehealth: Payer: Self-pay

## 2019-11-24 MED ORDER — LOSARTAN POTASSIUM 100 MG PO TABS: ORAL_TABLET | ORAL | 1 refills | Status: DC

## 2019-11-24 NOTE — Telephone Encounter (Signed)
Please refill as per office routine med refill policy (all routine meds refilled for 3 mo or monthly per pt preference up to one year from last visit, then month to month grace period for 3 mo, then further med refills will have to be denied)  

## 2019-11-26 ENCOUNTER — Telehealth: Payer: Self-pay | Admitting: Endocrinology

## 2019-11-26 NOTE — Telephone Encounter (Signed)
Patient called stating her sugars are too high when she wakes up in the morning and is requesting something to "get her through the night", possibly more insulin. Ph# [phone]

## 2019-11-26 NOTE — Telephone Encounter (Signed)
Are you taking any type of steroids? No  Are you sick or becoming sick? No  Is this the first elevated blood sugar reading? No  Have you tried to correct it with insulin? No  Have you been taking/taken today all of the prescribed medications? Yes  What is your current diabetic insulin or oral medication dosage? Soliqua 25-26 units QD in the am, Novolog 20 units TID AC, Humalog 75/25 20 units BID   Date Time Reading Notes  11/26/19 1100 268 All blood sugars are AM readings.  11/25/19 1000 259   11/24/19 0930 235   11/23/19 0900 203   11/22/19 1100 247   11/21/19 1000 231   11/20/19 1030 222   11/19/19 1100 200   11/18/19 1100 229   11/17/19 1000 236   11/16/19 1000 209   11/15/19 1000 173     Please advise

## 2019-11-26 NOTE — Telephone Encounter (Signed)
error 

## 2019-11-26 NOTE — Telephone Encounter (Signed)
What exactly is she taking her Humalog 75/25 insulin and monitors her blood sugar after dinner at night?.  Is she taking her Metformin as prescribed?

## 2019-11-26 NOTE — Telephone Encounter (Signed)
Noted  

## 2019-11-26 NOTE — Telephone Encounter (Signed)
Explained to her that without Metformin blood sugar will not be controlled in the morning, she needs to take at least 1 tablet with dinner. She needs to change her Humalog mix to before dinnertime and increase the dose to 34 units Also increase dose of Soliqua to 34 units To check blood sugars 2 to 3 hours after dinner at least every other day

## 2019-11-26 NOTE — Telephone Encounter (Signed)
Takes 75/25 once in am after waking up and at HS. Pt is not monitoring blood sugars after dinner. Pt is not taking metformin. Pt stated that she would like to stay off of metformin r/t GI s/e.

## 2019-12-03 ENCOUNTER — Ambulatory Visit: Payer: Medicare Other | Attending: Internal Medicine

## 2019-12-03 DIAGNOSIS — Z23 Encounter for immunization: Secondary | ICD-10-CM

## 2019-12-03 NOTE — Progress Notes (Signed)
   Covid-19 Vaccination Clinic  Name:  TAMAIA BENKE    MRN: [id number] DOB: 16-May-1951  12/03/2019  Ms. Balbo was observed post Covid-19 immunization for 15 minutes without incidence. She was provided with Vaccine Information Sheet and instruction to access the V-Safe system.   Ms. Reiser was instructed to call 911 with any severe reactions post vaccine: Marland Kitchen Difficulty breathing  . Swelling of your face and throat  . A fast heartbeat  . A bad rash all over your body  . Dizziness and weakness    Immunizations Administered    Name Date Dose VIS Date Route   Pfizer COVID-19 Vaccine 12/03/2019  1:34 PM 0.3 mL 09/17/2019 Intramuscular   Manufacturer: [place]   Lot: [id number]   [place]: [id number]

## 2019-12-06 DIAGNOSIS — N3010 Interstitial cystitis (chronic) without hematuria: Secondary | ICD-10-CM | POA: Diagnosis not present

## 2019-12-06 DIAGNOSIS — N3020 Other chronic cystitis without hematuria: Secondary | ICD-10-CM | POA: Diagnosis not present

## 2019-12-06 DIAGNOSIS — R109 Unspecified abdominal pain: Secondary | ICD-10-CM | POA: Diagnosis not present

## 2019-12-09 ENCOUNTER — Ambulatory Visit (INDEPENDENT_AMBULATORY_CARE_PROVIDER_SITE_OTHER): Payer: Medicare Other | Admitting: Podiatry

## 2019-12-09 ENCOUNTER — Encounter: Payer: Self-pay | Admitting: Podiatry

## 2019-12-09 ENCOUNTER — Other Ambulatory Visit: Payer: Self-pay

## 2019-12-09 VITALS — Temp 97.2°F

## 2019-12-09 DIAGNOSIS — M76822 Posterior tibial tendinitis, left leg: Secondary | ICD-10-CM | POA: Diagnosis not present

## 2019-12-09 DIAGNOSIS — T148XXA Other injury of unspecified body region, initial encounter: Secondary | ICD-10-CM

## 2019-12-10 ENCOUNTER — Telehealth: Payer: Self-pay | Admitting: *Deleted

## 2019-12-10 DIAGNOSIS — M76822 Posterior tibial tendinitis, left leg: Secondary | ICD-10-CM

## 2019-12-10 DIAGNOSIS — T148XXA Other injury of unspecified body region, initial encounter: Secondary | ICD-10-CM

## 2019-12-10 DIAGNOSIS — M25572 Pain in left ankle and joints of left foot: Secondary | ICD-10-CM

## 2019-12-10 DIAGNOSIS — M79672 Pain in left foot: Secondary | ICD-10-CM

## 2019-12-10 NOTE — Telephone Encounter (Signed)
Dr. Paulla Dolly ordered US of left lower extremity for torn posterior tibial tendon. Faxed orders to [facility].

## 2019-12-10 NOTE — Progress Notes (Signed)
Subjective:   Patient ID: [name], female   DOB: 69 y.o.   MRN: [id number]   HPI Patient states she still having a lot of pain in the inside of the left ankle and states that the boot while helping is not solving her problem and it continues to be very tender with pressure.  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  Patient will be scheduled for ultrasound and then we will get results and decide what we may be able to do to try to help her.  May require bracing therapy or other treatment but I am concerned about her lack of response so far conservative

## 2019-12-12 ENCOUNTER — Encounter (HOSPITAL_COMMUNITY): Payer: Self-pay | Admitting: *Deleted

## 2019-12-12 ENCOUNTER — Other Ambulatory Visit: Payer: Self-pay

## 2019-12-12 ENCOUNTER — Emergency Department (HOSPITAL_COMMUNITY)
Admission: EM | Admit: 2019-12-12 | Discharge: 2019-12-12 | Disposition: A | Discharge status: Home / Self Care | Payer: Medicare Other | Attending: Emergency Medicine | Admitting: Emergency Medicine

## 2019-12-12 DIAGNOSIS — N184 Chronic kidney disease, stage 4 (severe): Secondary | ICD-10-CM | POA: Insufficient documentation

## 2019-12-12 DIAGNOSIS — Z79899 Other long term (current) drug therapy: Secondary | ICD-10-CM | POA: Diagnosis not present

## 2019-12-12 DIAGNOSIS — Z96653 Presence of artificial knee joint, bilateral: Secondary | ICD-10-CM | POA: Diagnosis not present

## 2019-12-12 DIAGNOSIS — Z794 Long term (current) use of insulin: Secondary | ICD-10-CM | POA: Insufficient documentation

## 2019-12-12 DIAGNOSIS — I129 Hypertensive chronic kidney disease with stage 1 through stage 4 chronic kidney disease, or unspecified chronic kidney disease: Secondary | ICD-10-CM | POA: Diagnosis not present

## 2019-12-12 DIAGNOSIS — Z7982 Long term (current) use of aspirin: Secondary | ICD-10-CM | POA: Diagnosis not present

## 2019-12-12 DIAGNOSIS — R339 Retention of urine, unspecified: Secondary | ICD-10-CM | POA: Diagnosis present

## 2019-12-12 DIAGNOSIS — E039 Hypothyroidism, unspecified: Secondary | ICD-10-CM | POA: Insufficient documentation

## 2019-12-12 DIAGNOSIS — Z9049 Acquired absence of other specified parts of digestive tract: Secondary | ICD-10-CM | POA: Insufficient documentation

## 2019-12-12 DIAGNOSIS — R300 Dysuria: Secondary | ICD-10-CM

## 2019-12-12 DIAGNOSIS — I2510 Atherosclerotic heart disease of native coronary artery without angina pectoris: Secondary | ICD-10-CM | POA: Insufficient documentation

## 2019-12-12 DIAGNOSIS — Z87891 Personal history of nicotine dependence: Secondary | ICD-10-CM | POA: Diagnosis not present

## 2019-12-12 DIAGNOSIS — E1122 Type 2 diabetes mellitus with diabetic chronic kidney disease: Secondary | ICD-10-CM | POA: Diagnosis not present

## 2019-12-12 LAB — URINALYSIS, ROUTINE W REFLEX MICROSCOPIC
Bilirubin Urine: NEGATIVE
Glucose, UA: >=500 mg/dL — AB
Hgb urine dipstick: NEGATIVE
Ketones, ur: NEGATIVE mg/dL
Leukocytes,Ua: NEGATIVE
Nitrite: NEGATIVE
Protein, ur: NEGATIVE mg/dL
Specific Gravity, Urine: 1.018 (ref 1.005–1.030)
pH: 6 (ref 5.0–8.0)

## 2019-12-12 LAB — CBC WITH DIFFERENTIAL/PLATELET
Abs Immature Granulocytes: 0.05 10*3/uL (ref 0.00–0.07)
Basophils Absolute: 0 10*3/uL (ref 0.0–0.1)
Basophils Relative: 0 %
Eosinophils Absolute: 0.2 10*3/uL (ref 0.0–0.5)
Eosinophils Relative: 2 %
HCT: 35.8 % — ABNORMAL LOW (ref 36.0–46.0)
Hemoglobin: 10.9 g/dL — ABNORMAL LOW (ref 12.0–15.0)
Immature Granulocytes: 1 %
Lymphocytes Relative: 22 %
Lymphs Abs: 1.6 10*3/uL (ref 0.7–4.0)
MCH: 31.4 pg (ref 26.0–34.0)
MCHC: 30.4 g/dL (ref 30.0–36.0)
MCV: 103.2 fL — ABNORMAL HIGH (ref 80.0–100.0)
Monocytes Absolute: 0.3 10*3/uL (ref 0.1–1.0)
Monocytes Relative: 4 %
Neutro Abs: 5 10*3/uL (ref 1.7–7.7)
Neutrophils Relative %: 71 %
Platelets: 234 10*3/uL (ref 150–400)
RBC: 3.47 MIL/uL — ABNORMAL LOW (ref 3.87–5.11)
RDW: 13.3 % (ref 11.5–15.5)
WBC: 7.1 10*3/uL (ref 4.0–10.5)
nRBC: 0.3 % — ABNORMAL HIGH (ref 0.0–0.2)

## 2019-12-12 LAB — BASIC METABOLIC PANEL
Anion gap: 9 (ref 5–15)
BUN: 16 mg/dL (ref 8–23)
CO2: 26 mmol/L (ref 22–32)
Calcium: 9.6 mg/dL (ref 8.9–10.3)
Chloride: 103 mmol/L (ref 98–111)
Creatinine, Ser: 1.52 mg/dL — ABNORMAL HIGH (ref 0.44–1.00)
GFR calc Af Amer: 40 mL/min — ABNORMAL LOW (ref >60)
GFR calc non Af Amer: 35 mL/min — ABNORMAL LOW (ref >60)
Glucose, Bld: 311 mg/dL — ABNORMAL HIGH (ref 70–99)
Potassium: 4 mmol/L (ref 3.5–5.1)
Sodium: 138 mmol/L (ref 135–145)

## 2019-12-12 MED ORDER — SODIUM CHLORIDE 0.9 % IV BOLUS
1000.0000 mL | Freq: Once | INTRAVENOUS | Status: AC
  Administered 2019-12-12: 12:00:00 1000 mL via INTRAVENOUS

## 2019-12-12 NOTE — ED Provider Notes (Signed)
[facility] DEPT Provider Note   CSN: [id number] Arrival date & time: 12/12/19  1021     History Chief Complaint  Patient presents with  . Urinary Retention    [name] is a 69 y.o. female.  HPI   Patient is a 69 year old female with a history of blood dyscrasias, CKD, CAD, GERD, hyperlipidemia, hypertension, diabetes, chronic interstitial cystitis who presents the emergency department today for evaluation of urinary retention.  Patient states that she has had dysuria, urgency and a feeling that she is not emptying her bladder for the last few days.  States that she had urinary retention earlier this week and was seen at her urologist office where they did an in and out catheter.  She had been on antibiotics for UTI at that time so she continued the antibiotic and after finishing it she states this is when symptoms started.  She denies any hematuria.  She has had some chills at home.  She denies any abdominal pain nausea vomiting, diarrhea or constipation.  Past Medical History:  Diagnosis Date  . Anxiety   . Arthritis   . Blood dyscrasia    "FREE BLEEDER"  . CERVICAL RADICULOPATHY, LEFT   . Chronic back pain   . CKD (chronic kidney disease)    DR. SANFORD  [facility]  . COMMON MIGRAINE   . CORONARY ARTERY DISEASE   . Cough    CURRENT COLD  . Cystitis   . DEPRESSION   . Diabetes mellitus, type II ([place])   . DIVERTICULOSIS-COLON   . Dysrhythmia    palpitations  . Gastric ulcer 04/2008  . Gastroparesis   . GERD (gastroesophageal reflux disease)   . Hiatal hernia   . Hyperlipidemia   . Hypertension   . Hypothyroidism   . INSOMNIA-SLEEP DISORDER-UNSPEC   . Iron deficiency anemia   . Wears glasses     Patient Active Problem List   Diagnosis Date Noted  . Rectal bleeding 09/01/2019  . History of peptic ulcer disease 09/01/2019  . Screening for viral disease 09/01/2019  . Insulin dependent type 2 diabetes mellitus ([place])  09/01/2019  . Pyelonephritis 06/05/2019  . Recurrent UTI 06/05/2019  . Urinary incontinence 06/05/2019  . Nausea and vomiting 02/17/2019  . Nausea & vomiting 02/08/2019  . Abdominal pain 02/08/2019  . Constipation 02/08/2019  . UTI (urinary tract infection) 02/08/2019  . LUQ pain 01/21/2019  . Cervical radiculopathy at C6 01/28/2018  . Left lateral epicondylitis 01/23/2018  . Obesity 01/23/2018  . Weakness 05/01/2017  . Hypothyroidism 05/01/2017  . Hyperkalemia 05/01/2017  . Acute renal failure superimposed on stage 4 chronic kidney disease ([place]) 05/01/2017  . Hypotension 05/01/2017  . Sepsis ([place]) 05/01/2017  . Dysuria 04/23/2017  . Right leg swelling 12/27/2016  . Leg pain, lateral, right 11/05/2016  . Radiculopathy 04/03/2016  . Hypersomnolence 11/23/2015  . Right sided abdominal pain 07/25/2015  . Malaise and fatigue 11/05/2014  . Fracture of fifth metacarpal bone of right hand 07/08/2014  . Right cervical radiculopathy 06/27/2014  . Chronic interstitial cystitis 05/23/2014  . Osteoarthritis of Cottle joint of thumb 04/11/2014  . De Quervain's tenosynovitis, left 03/14/2014  . CKD (chronic kidney disease) stage 4, GFR 15-29 ml/min ([facility]) 03/03/2014  . Lower back pain 03/03/2014  . Ingrown nail 03/03/2014  . Peripheral edema 02/23/2014  . Lateral epicondylitis of right elbow 01/31/2014  . Neck pain on left side 11/19/2013  . Abdominal discomfort 10/12/2012  . Right lumbar radiculopathy 05/11/2012  .  Peripheral neuropathy 05/11/2012  . Vertigo 03/29/2012  . Headache(784.0) 03/10/2012  . Lumbar radiculopathy 10/24/2011  . 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 Right leg weakness 04/04/2011  . Hematochezia 01/07/2011  . Preventative health care 01/07/2011  . CHOLELITHIASIS 05/04/2009  . GERD 04/26/2009  . Gastroparesis 04/26/2009  . ULCER-GASTRIC 03/15/2009  . DIVERTICULOSIS-COLON 03/15/2009  . Cervical radiculopathy 10/28/2008  . MENOPAUSAL DISORDER  06/29/2008  . Pain in joint, shoulder region 10/12/2007  . INSOMNIA-SLEEP DISORDER-UNSPEC 09/01/2007  . Depression 09/01/2007  . ANEMIA-NOS 08/13/2007  . COMMON MIGRAINE 08/13/2007  . 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 Diabetes ([place]) 05/06/2007  . HLD (hyperlipidemia) 05/06/2007  . ANXIETY 05/06/2007  . Essential hypertension 05/06/2007  . Coronary atherosclerosis 05/06/2007    Past Surgical History:  Procedure Laterality Date  . ABDOMINAL HYSTERECTOMY    . ANTERIOR LAT LUMBAR FUSION Left 08/13/2017   Procedure: LEFT SIDED LUMBAR 3-4 LATERAL INTERBODY FUSION WITH INSTRUMENTATION AND ALLOGRAFT;  Surgeon: Phylliss Bob, MD;  Location: [facility];  Service: Orthopedics;  Laterality: Left;  LEFT SIDED LUMBAR 3-4 LATERAL INTERBODY FUSION WITH INSTRUMENTATION AND ALLOGRAFT; REQUEST 3 HOURS  . Back Stimulator  07/2018  . BACK SURGERY  03/2016  . BREAST EXCISIONAL BIOPSY Right    40 years ago  . CHOLECYSTECTOMY  06/2009  . COLONOSCOPY    . ESOPHAGOGASTRODUODENOSCOPY    . EYE SURGERY Bilateral    lasik  . Gastric Wedge resection lipoma  11/2007   x2 with laparotomy and gastrostomy  . JOINT REPLACEMENT    . Left knee replacement    . Rigth knee replacement with revision  04/2008   Dr. Berenice Primas  . ROTATOR CUFF REPAIR Left 01/2009  . s/p bladder surgury  09/2009   Dr. Terance Hart  . SPINAL CORD STIMULATOR INSERTION N/A 09/11/2018   Procedure: LUMBAR SPINAL CORD STIMULATOR INSERTION;  Surgeon: Clydell Hakim, MD;  Location: [facility];  Service: Neurosurgery;  Laterality: N/A;  LUMBAR SPINAL CORD STIMULATOR INSERTION     OB History    Gravida  3   Para  3   Term      Preterm      AB      Living  3     SAB      TAB      Ectopic      Multiple      Live Births              Family History  Problem Relation Age of Onset  . Diabetes Sister        x 3  . Heart disease Sister        x2  . Breast cancer Sister 82  . Diabetes Brother        x3  . Heart disease Brother         x2  . Coronary artery disease Other        female 1st degree  . Hypertension Other   . Breast cancer Sister 105  . Breast cancer Other 25  . Diabetes Brother   . Colon cancer Neg Hx     Social History   Tobacco Use  . Smoking status: Former Research scientist (life sciences)  . Smokeless tobacco: Never Used  . Tobacco comment: quit 30 years ago  Substance Use Topics  . Alcohol use: No    Alcohol/week: 0.0 standard drinks  . Drug use: No    Home Medications Prior to Admission medications   Medication Sig Start Date End Date Taking? Authorizing  Provider  aspirin 81 MG EC tablet Take 81 mg by mouth every evening.     [provider]  atorvastatin (LIPITOR) 40 MG tablet Take 1 tablet (40 mg total) by mouth at bedtime. 01/21/19   Biagio Borg, MD  buPROPion (WELLBUTRIN XL) 150 MG 24 hr tablet Take 1 tablet (150 mg total) by mouth daily. 01/21/19   Biagio Borg, MD  cloNIDine (CATAPRES) 0.1 MG tablet TAKE ONE TABLET BY MOUTH TWICE A DAY 11/15/19   Elayne Snare, MD  Continuous Blood Gluc Receiver (FREESTYLE LIBRE 14 DAY READER) DEVI by Does not apply route.    [provider]  ELMIRON 100 MG capsule Take 200 mg by mouth 2 (two) times daily.  07/20/13   [provider]  FLUoxetine (PROZAC) 20 MG capsule TAKE 1 CAPSULE BY MOUTH DAILY 01/21/19   Biagio Borg, MD  gabapentin (NEURONTIN) 100 MG capsule Take 100 mg by mouth 3 (three) times daily. 10/14/19   [provider]  gabapentin (NEURONTIN) 300 MG capsule Take 300 mg by mouth 3 (three) times daily.  07/12/19   [provider]  glucose blood (FREESTYLE TEST STRIPS) test strip Use as instructed to check blood sugar 4 times daily. 04/20/18   Elayne Snare, MD  hydrOXYzine (ATARAX/VISTARIL) 10 MG tablet  04/08/19   [provider]  insulin aspart (NOVOLOG FLEXPEN) 100 UNIT/ML FlexPen Inject 5-10 Units into the skin 3 (three) times daily with meals. Inject 5-10 units under the skin three times daily before meals.    [provider]  Insulin Glargine-Lixisenatide (SOLIQUA) 100-33 UNT-MCG/ML SOPN Inject 26 Units into the skin daily with breakfast. 07/29/19   Elayne Snare, MD  Insulin Lispro Prot & Lispro (HUMALOG MIX 75/25 KWIKPEN) (75-25) 100 UNIT/ML Kwikpen Inject 20 Units into the skin daily with breakfast. 10/20/19   Elayne Snare, MD  levothyroxine (SYNTHROID) 125 MCG tablet Take 1 tablet by mouth daily, Sunday-Friday. On Saturday, take 1.5 tablets. 11/22/19   Elayne Snare, MD  linaclotide [facility]) 72 MCG capsule Take 1 capsule (72 mcg total) by mouth daily before breakfast. 01/25/19   Ladene Artist, MD  losartan (COZAAR) 100 MG tablet Take one tab daily 11/24/19   Biagio Borg, MD  metoprolol succinate (TOPROL-XL) 50 MG 24 hr tablet TAKE 1 TABLET BY MOUTH DAILY WITH OR IMMEDIATELY FOLLOWING A MEAL 08/12/19   Elayne Snare, MD  Na Sulfate-K Sulfate-Mg Sulf (SUPREP BOWEL PREP KIT) 17.5-3.13-1.6 GM/177ML SOLN Take 1 kit by mouth as directed. For colonoscopy prep 09/01/19   Zehr, Janett Billow D, PA-C  naltrexone (DEPADE) 50 MG tablet Take 1 tablet (50 mg total) by mouth daily. 06/01/19   Biagio Borg, MD  pantoprazole (PROTONIX) 40 MG tablet TAKE 1 TABLET(40 MG) BY MOUTH TWICE DAILY 10/03/19   Zehr, Laban Emperor, PA-C  promethazine (PHENERGAN) 25 MG tablet Take 1 tablet (25 mg total) by mouth every 8 (eight) hours as needed for nausea or vomiting. 03/23/19   Biagio Borg, MD  traMADol (ULTRAM) 50 MG tablet Take 1 tablet (50 mg total) by mouth every 6 (six) hours as needed. 05/02/19   Scot Jun, FNP    Allergies    Ciprofloxacin, Hydrocodone, and Penicillins  Review of Systems   Review of Systems  Constitutional: Positive for chills. Negative for fever.  HENT: Negative for ear pain and sore throat.   Eyes: Negative for visual disturbance.  Respiratory: Negative for cough and shortness of breath.   Cardiovascular: Negative for  chest pain.  Gastrointestinal: Negative for abdominal pain, constipation, diarrhea,  nausea and vomiting.  Genitourinary: Positive for dysuria, frequency and urgency. Negative for hematuria.  Musculoskeletal: Negative for back pain.  Skin: Negative for rash.  Neurological: Negative for seizures and syncope.  All other systems reviewed and are negative.   Physical Exam Updated Vital Signs BP (!) 179/115 (BP Location: Right Arm)   Pulse 71   Temp 98.9 F (37.2 C) (Oral)   Resp 16   Ht '5\' 6"'$  (1.676 m)   Wt 120.2 kg   SpO2 100%   BMI 42.77 kg/m   Physical Exam Vitals and nursing note reviewed.  Constitutional:      General: She is not in acute distress.    Appearance: She is well-developed. She is obese.  HENT:     Head: Normocephalic and atraumatic.  Eyes:     Conjunctiva/sclera: Conjunctivae normal.  Cardiovascular:     Rate and Rhythm: Normal rate and regular rhythm.     Heart sounds: No murmur.  Pulmonary:     Effort: Pulmonary effort is normal. No respiratory distress.     Breath sounds: Normal breath sounds.  Abdominal:     General: Bowel sounds are normal.     Palpations: Abdomen is soft.     Tenderness: There is abdominal tenderness in the suprapubic area. There is no guarding or rebound.  Musculoskeletal:     Cervical back: Neck supple.  Skin:    General: Skin is warm and dry.  Neurological:     Mental Status: She is alert.     ED Results / Procedures / Treatments   Labs (all labs ordered are listed, but only abnormal results are displayed) Labs Reviewed  URINALYSIS, ROUTINE W REFLEX MICROSCOPIC - Abnormal; Notable for the following components:      Result Value   Glucose, UA >=500 (*)    Bacteria, UA RARE (*)    All other components within normal limits  CBC WITH DIFFERENTIAL/PLATELET - Abnormal; Notable for the following components:   RBC 3.47 (*)    Hemoglobin 10.9 (*)    HCT 35.8 (*)    MCV 103.2 (*)    nRBC 0.3 (*)    All other components within normal limits  BASIC METABOLIC PANEL - Abnormal; Notable for the following  components:   Glucose, Bld 311 (*)    Creatinine, Ser 1.52 (*)    GFR calc non Af Amer 35 (*)    GFR calc Af Amer 40 (*)    All other components within normal limits  URINE CULTURE    EKG None  Radiology No results found.  Procedures Procedures (including critical care time)  Medications Ordered in ED Medications  sodium chloride 0.9 % bolus 1,000 mL (0 mLs Intravenous Stopped 12/12/19 1337)    ED Course  I have reviewed the triage vital signs and the nursing notes.  Pertinent labs & imaging results that were available during my care of the patient were reviewed by me and considered in my medical decision making (see chart for details).    MDM Rules/Calculators/A&P                      69 y/o F presenting for eval of urinary retention.   Reviewed labs CBC is without leukocytosis, anemia present but stable. BMP with elevated creatinine which is consistent with prior and actually improved compared to prior labs UA neg for UTI. Culture was sent given pt does have  some sxs.  Nursing staff attempted to obtain a bladder scan however due to body habitus they were unable to get a reliable read.  Foley catheter was placed and only 100 cc were obtained.    Pt was given IVF and had about 700cc of output following this. She states she feels better with the foley in. Dr. Maryan Rued advised to keep the catheter in place and have the patient f/u with urology next week for reassessment. She voices understanding of the plan and reasons to return. All questions answered, pt stable for d/c.    Final Clinical Impression(s) / ED Diagnoses Final diagnoses:  Dysuria    Rx / DC Orders ED Discharge Orders    None       Rodney Booze, PA-C 12/12/19 1356    Blanchie Dessert, MD 12/12/19 1511

## 2019-12-12 NOTE — Discharge Instructions (Addendum)
A culture was sent of your urine today to determine if there is any bacterial growth. If the results of the culture are positive and you require an antibiotic or a change of your prescribed antibiotic you will be contacted by the hospital. If the results are negative you will not be contacted.  Please follow up with your urologist within 5-7 days for re-evaluation of your symptoms. You will need to see them next week to take out the foley catheter. Please return to the emergency department for any new or worsening symptoms.

## 2019-12-12 NOTE — ED Triage Notes (Signed)
Pt states she can't urinae, able to empty a small amount at intervals, she cannot tell writer the last time she felt like she emptied her bladder, reports chills started last night.

## 2019-12-13 LAB — URINE CULTURE: Culture: NO GROWTH

## 2019-12-14 ENCOUNTER — Other Ambulatory Visit: Payer: Self-pay | Admitting: Endocrinology

## 2019-12-14 ENCOUNTER — Other Ambulatory Visit: Payer: Self-pay | Admitting: Internal Medicine

## 2019-12-17 DIAGNOSIS — R102 Pelvic and perineal pain: Secondary | ICD-10-CM | POA: Diagnosis not present

## 2019-12-17 DIAGNOSIS — N3020 Other chronic cystitis without hematuria: Secondary | ICD-10-CM | POA: Diagnosis not present

## 2019-12-17 DIAGNOSIS — N3010 Interstitial cystitis (chronic) without hematuria: Secondary | ICD-10-CM | POA: Diagnosis not present

## 2019-12-17 DIAGNOSIS — R3914 Feeling of incomplete bladder emptying: Secondary | ICD-10-CM | POA: Diagnosis not present

## 2019-12-20 ENCOUNTER — Other Ambulatory Visit: Payer: Medicare Other

## 2019-12-23 ENCOUNTER — Ambulatory Visit: Payer: Medicare Other | Admitting: Endocrinology

## 2019-12-23 DIAGNOSIS — R3914 Feeling of incomplete bladder emptying: Secondary | ICD-10-CM | POA: Diagnosis not present

## 2019-12-23 DIAGNOSIS — N3010 Interstitial cystitis (chronic) without hematuria: Secondary | ICD-10-CM | POA: Diagnosis not present

## 2019-12-23 DIAGNOSIS — Z0289 Encounter for other administrative examinations: Secondary | ICD-10-CM

## 2019-12-23 NOTE — Progress Notes (Deleted)
e

## 2019-12-24 ENCOUNTER — Ambulatory Visit: Payer: Medicare Other | Attending: Internal Medicine

## 2019-12-24 DIAGNOSIS — Z23 Encounter for immunization: Secondary | ICD-10-CM

## 2019-12-24 NOTE — Progress Notes (Signed)
   Covid-19 Vaccination Clinic  Name:  [name]    MRN: [id number] DOB: [birth year]  12/24/2019  [name] was observed post Covid-19 immunization for 15 minutes without incident. She was provided with Vaccine Information Sheet and instruction to access the V-Safe system.   [name] was instructed to call 911 with any severe reactions post vaccine: Marland Kitchen Difficulty breathing  . Swelling of face and throat  . A fast heartbeat  . A bad rash all over body  . Dizziness and weakness   Immunizations Administered    Name Date Dose VIS Date Route   Pfizer COVID-19 Vaccine 12/24/2019  3:01 PM 0.3 mL 09/17/2019 Intramuscular   Manufacturer: [place]   Lot: [id number]   [place]: [address]-7

## 2019-12-27 ENCOUNTER — Other Ambulatory Visit: Payer: Self-pay

## 2019-12-27 ENCOUNTER — Other Ambulatory Visit (INDEPENDENT_AMBULATORY_CARE_PROVIDER_SITE_OTHER): Payer: Medicare Other

## 2019-12-27 DIAGNOSIS — Z794 Long term (current) use of insulin: Secondary | ICD-10-CM | POA: Diagnosis not present

## 2019-12-27 DIAGNOSIS — E1165 Type 2 diabetes mellitus with hyperglycemia: Secondary | ICD-10-CM | POA: Diagnosis not present

## 2019-12-27 DIAGNOSIS — E890 Postprocedural hypothyroidism: Secondary | ICD-10-CM | POA: Diagnosis not present

## 2019-12-27 DIAGNOSIS — E785 Hyperlipidemia, unspecified: Secondary | ICD-10-CM

## 2019-12-27 LAB — COMPREHENSIVE METABOLIC PANEL
ALT: 13 U/L (ref 0–35)
AST: 16 U/L (ref 0–37)
Albumin: 4 g/dL (ref 3.5–5.2)
Alkaline Phosphatase: 93 U/L (ref 39–117)
BUN: 12 mg/dL (ref 6–23)
CO2: 26 mEq/L (ref 19–32)
Calcium: 9.7 mg/dL (ref 8.4–10.5)
Chloride: 97 mEq/L (ref 96–112)
Creatinine, Ser: 1.95 mg/dL — ABNORMAL HIGH (ref 0.40–1.20)
GFR: 30.7 mL/min — ABNORMAL LOW (ref >60.00)
Glucose, Bld: 409 mg/dL — ABNORMAL HIGH (ref 70–99)
Potassium: 4.2 mEq/L (ref 3.5–5.1)
Sodium: 131 mEq/L — ABNORMAL LOW (ref 135–145)
Total Bilirubin: 1 mg/dL (ref 0.2–1.2)
Total Protein: 7.8 g/dL (ref 6.0–8.3)

## 2019-12-27 LAB — LIPID PANEL
Cholesterol: 313 mg/dL — ABNORMAL HIGH (ref 0–200)
HDL: 72.5 mg/dL (ref >39.00)
LDL Cholesterol: 208 mg/dL — ABNORMAL HIGH (ref 0–99)
NonHDL: 240.04
Total CHOL/HDL Ratio: 4
Triglycerides: 159 mg/dL — ABNORMAL HIGH (ref 0.0–149.0)
VLDL: 31.8 mg/dL (ref 0.0–40.0)

## 2019-12-27 LAB — HEMOGLOBIN A1C: Hgb A1c MFr Bld: 9 % — ABNORMAL HIGH (ref 4.6–6.5)

## 2019-12-28 ENCOUNTER — Ambulatory Visit (INDEPENDENT_AMBULATORY_CARE_PROVIDER_SITE_OTHER): Payer: Medicare Other | Admitting: Endocrinology

## 2019-12-28 ENCOUNTER — Ambulatory Visit: Payer: Medicare Other

## 2019-12-28 ENCOUNTER — Encounter: Payer: Self-pay | Admitting: Endocrinology

## 2019-12-28 VITALS — BP 110/70 | HR 127 | Ht 66.0 in | Wt 291.0 lb

## 2019-12-28 DIAGNOSIS — E890 Postprocedural hypothyroidism: Secondary | ICD-10-CM | POA: Diagnosis not present

## 2019-12-28 DIAGNOSIS — E785 Hyperlipidemia, unspecified: Secondary | ICD-10-CM

## 2019-12-28 DIAGNOSIS — Z794 Long term (current) use of insulin: Secondary | ICD-10-CM | POA: Diagnosis not present

## 2019-12-28 DIAGNOSIS — N289 Disorder of kidney and ureter, unspecified: Secondary | ICD-10-CM | POA: Diagnosis not present

## 2019-12-28 DIAGNOSIS — E1165 Type 2 diabetes mellitus with hyperglycemia: Secondary | ICD-10-CM | POA: Diagnosis not present

## 2019-12-28 LAB — GLUCOSE, POCT (MANUAL RESULT ENTRY): POC Glucose: 340 mg/dl — AB (ref 70–99)

## 2019-12-28 LAB — TSH: TSH: 53.7 u[IU]/mL — ABNORMAL HIGH (ref 0.35–4.50)

## 2019-12-28 NOTE — Progress Notes (Signed)
Per instructions of Dr. Dwyane Dee, pt was given one insulin pen of Tresiba as a sample. FOA:[id number] EX:01/2021

## 2019-12-28 NOTE — Progress Notes (Signed)
[name] is a 69 y.o. female.    Reason for Appointment: follow-up   History of Present Illness   Diagnosis: Type 2 DIABETES MELITUS, date of diagnosis: 2000  Previous history: She had initially been treated with metformin and Amaryl and subsequently given Victoza which helped overall control and weight loss. In 9/13 because of significant hyperglycemia she was started on basal bolus insulin also. Had required relatively small doses of insulin. Her blood sugar control has been excellent with A1c between 5.3-6.0 although this may be relatively higher than expected. Tends to have relatively higher readings after supper In 6/15 she was told to resume her Victoza and stop her Lantus since she had been gaining weight with Lantus and NovoLog regimen.   Recent history:     INSULIN regimen: Novolog 20 units before dinner, Humalog mix 20 bid   Non-insulin hypoglycemic drugs: Soliqua 26 units daily   Her A1c was last 7.3 and is now 9%   Current blood sugar patterns and management:  She has had much higher blood sugars recently  She has however not checked her blood sugars much at all even though she has the freestyle libre  Also she has apparently been taking her Humalog mix twice a day instead of once a day as directed without improvement in blood sugars  She also takes NovoLog at suppertime  Blood sugars probably have been higher with stopping Metformin  She said that she has very little appetite and persistent nausea and is not eating any significant amount of food  Also not drinking much for liquid intake  Her blood sugars are mostly in the 300-400 range including on the lab and appears to be the same throughout the day with the limited amount of information available    Side effects from medications:  none  Monitors blood glucose: Rarely  Glucometer:  Freestyle Libre/Accu-Chek Readings range from 331 up to 446 at home and only available for 3 days.       CGM  use % of time  44  2-week average/SD  204, GV 35  Time in range  34     %, was 51  % Time Above 180  33  % Time above 250  29  % Time Below 70 4     PRE-MEAL Fasting Lunch Dinner Bedtime Overall  Glucose range:    224    Averages:  163     204   POST-MEAL PC Breakfast PC Lunch PC Dinner  Glucose range:     Averages:  213   276   Meals: 1-2 meals per day. Lunch 12 Supper at 5 PM            Physical activity: exercise: Limited, has had fatigue, back and leg pain.            Dietician visit: Most recent: 03/453            Complications: are: None  Wt Readings from Last 3 Encounters:  12/28/19 291 lb (132 kg)  12/12/19 265 lb (120.2 kg)  10/20/19 297 lb 9.6 oz (135 kg)    LABS:  Lab Results  Component Value Date   HGBA1C 9.0 (H) 12/27/2019   HGBA1C 7.3 (A) 09/08/2019   HGBA1C 7.0 (A) 05/12/2019   Lab Results  Component Value Date   MICROALBUR <0.7 04/22/2019   LDLCALC 208 (H) 12/27/2019   CREATININE 1.95 (H) 12/27/2019   Lab Results  Component Value Date   FRUCTOSAMINE  401 (H) 10/20/2019   FRUCTOSAMINE 369 (H) 09/08/2019   FRUCTOSAMINE 364 (H) 06/21/2019     Other active problems: See review of systems   Office Visit on 12/28/2019  Component Date Value Ref Range Status  . POC Glucose 12/28/2019 340* 70 - 99 mg/dl Final  Appointment on 12/27/2019  Component Date Value Ref Range Status  . Cholesterol 12/27/2019 313* 0 - 200 mg/dL Final   ATP III Classification       Desirable:  < 200 mg/dL               Borderline High:  200 - 239 mg/dL          High:  > = 240 mg/dL  . Triglycerides 12/27/2019 159.0* 0.0 - 149.0 mg/dL Final   Normal:  <150 mg/dLBorderline High:  150 - 199 mg/dL  . HDL 12/27/2019 72.50  >39.00 mg/dL Final  . VLDL 12/27/2019 31.8  0.0 - 40.0 mg/dL Final  . LDL Cholesterol 12/27/2019 208* 0 - 99 mg/dL Final  . Total CHOL/HDL Ratio 12/27/2019 4   Final                  Men          Women1/2 Average Risk     3.4          3.3Average Risk           5.0          4.42X Average Risk          9.6          7.13X Average Risk          15.0          11.0                      . NonHDL 12/27/2019 240.04   Final   NOTE:  Non-HDL goal should be 30 mg/dL higher than patient's LDL goal (i.e. LDL goal of < 70 mg/dL, would have non-HDL goal of < 100 mg/dL)  . TSH 12/27/2019 53.70* 0.35 - 4.50 uIU/mL Final  . Sodium 12/27/2019 131* 135 - 145 mEq/L Final  . Potassium 12/27/2019 4.2  3.5 - 5.1 mEq/L Final  . Chloride 12/27/2019 97  96 - 112 mEq/L Final  . CO2 12/27/2019 26  19 - 32 mEq/L Final  . Glucose, Bld 12/27/2019 409* 70 - 99 mg/dL Final  . BUN 12/27/2019 12  6 - 23 mg/dL Final  . Creatinine, Ser 12/27/2019 1.95* 0.40 - 1.20 mg/dL Final  . Total Bilirubin 12/27/2019 1.0  0.2 - 1.2 mg/dL Final  . Alkaline Phosphatase 12/27/2019 93  39 - 117 U/L Final  . AST 12/27/2019 16  0 - 37 U/L Final  . ALT 12/27/2019 13  0 - 35 U/L Final  . Total Protein 12/27/2019 7.8  6.0 - 8.3 g/dL Final  . Albumin 12/27/2019 4.0  3.5 - 5.2 g/dL Final  . GFR 12/27/2019 30.70* >60.00 mL/min Final  . Calcium 12/27/2019 9.7  8.4 - 10.5 mg/dL Final  . Hgb A1c MFr Bld 12/27/2019 9.0* 4.6 - 6.5 % Final   Glycemic Control Guidelines for People with Diabetes:Non Diabetic:  <6%Goal of Therapy: <7%Additional Action Suggested:  >8%     Allergies as of 12/28/2019      Reactions   Ciprofloxacin Shortness Of Breath, Other (See Comments)   Dizziness   Hydrocodone Shortness Of Breath, Other (See Comments)   Tachycardia  Penicillins Hives   Pt just remembers hives Has patient had a PCN reaction causing immediate rash, facial/tongue/throat swelling, SOB or lightheadedness with hypotension: No Has patient had a PCN reaction causing severe rash involving mucus membranes or skin necrosis: No Has patient had a PCN reaction that required hospitalization No Has patient had a PCN reaction occurring within the last 10 years: No If all of the above answers are "NO", then may proceed  with Cephalosporin use.      Medication List       Accurate as of December 28, 2019  4:59 PM. If you have any questions, ask your nurse or doctor.        aspirin 81 MG EC tablet Take 81 mg by mouth every evening.   atorvastatin 40 MG tablet Commonly known as: LIPITOR TAKE ONE TABLET BY MOUTH EVERY NIGHT AT BEDTIME   buPROPion 150 MG 24 hr tablet Commonly known as: Wellbutrin XL Take 1 tablet (150 mg total) by mouth daily.   cloNIDine 0.1 MG tablet Commonly known as: CATAPRES TAKE ONE TABLET BY MOUTH TWICE A DAY   Elmiron 100 MG capsule Generic drug: pentosan polysulfate Take 200 mg by mouth 2 (two) times daily.   FLUoxetine 20 MG capsule Commonly known as: PROZAC TAKE ONE CAPSULE BY MOUTH DAILY   FreeStyle Libre 14 Day Reader Kerrin Mo by Does not apply route.   gabapentin 300 MG capsule Commonly known as: NEURONTIN Take 300 mg by mouth 3 (three) times daily. What changed: Another medication with the same name was removed. Continue taking this medication, and follow the directions you see here. Changed by: Elayne Snare, MD   glucose blood test strip Commonly known as: FREESTYLE TEST STRIPS Use as instructed to check blood sugar 4 times daily.   hydrOXYzine 10 MG tablet Commonly known as: ATARAX/VISTARIL   Insulin Lispro Prot & Lispro (75-25) 100 UNIT/ML Kwikpen Commonly known as: HumaLOG Mix 75/25 KwikPen Inject 20 Units into the skin daily with breakfast.   levothyroxine 125 MCG tablet Commonly known as: SYNTHROID Take 1 tablet by mouth daily, Sunday-Friday. On Saturday, take 1.5 tablets.   linaclotide 72 MCG capsule Commonly known as: Linzess Take 1 capsule (72 mcg total) by mouth daily before breakfast.   losartan 100 MG tablet Commonly known as: COZAAR Take one tab daily   metoprolol succinate 50 MG 24 hr tablet Commonly known as: TOPROL-XL TAKE 1 TABLET BY MOUTH DAILY WITH OR IMMEDIATELY FOLLOWING A MEAL   naltrexone 50 MG tablet Commonly known as:  DEPADE Take 1 tablet (50 mg total) by mouth daily.   NovoLOG FlexPen 100 UNIT/ML FlexPen Generic drug: insulin aspart Inject 5-10 Units into the skin 3 (three) times daily with meals. Inject 5-10 units under the skin three times daily before meals.   pantoprazole 40 MG tablet Commonly known as: PROTONIX TAKE 1 TABLET(40 MG) BY MOUTH TWICE DAILY   promethazine 25 MG tablet Commonly known as: PHENERGAN Take 1 tablet (25 mg total) by mouth every 8 (eight) hours as needed for nausea or vomiting.   Soliqua 100-33 UNT-MCG/ML Sopn Generic drug: Insulin Glargine-Lixisenatide Inject 26 Units into the skin daily with breakfast.   Suprep Bowel Prep Kit 17.5-3.13-1.6 GM/177ML Soln Generic drug: Na Sulfate-K Sulfate-Mg Sulf Take 1 kit by mouth as directed. For colonoscopy prep   traMADol 50 MG tablet Commonly known as: ULTRAM Take 1 tablet (50 mg total) by mouth every 6 (six) hours as needed.       Allergies:  Allergies  Allergen Reactions  . Ciprofloxacin Shortness Of Breath and Other (See Comments)    Dizziness   . Hydrocodone Shortness Of Breath and Other (See Comments)    Tachycardia  . Penicillins Hives    Pt just remembers hives Has patient had a PCN reaction causing immediate rash, facial/tongue/throat swelling, SOB or lightheadedness with hypotension: No Has patient had a PCN reaction causing severe rash involving mucus membranes or skin necrosis: No Has patient had a PCN reaction that required hospitalization No Has patient had a PCN reaction occurring within the last 10 years: No If all of the above answers are "NO", then may proceed with Cephalosporin use.     Past Medical History:  Diagnosis Date  . Anxiety   . Arthritis   . Blood dyscrasia    "FREE BLEEDER"  . CERVICAL RADICULOPATHY, LEFT   . Chronic back pain   . CKD (chronic kidney disease)    DR. SANFORD  [facility]  . COMMON MIGRAINE   . CORONARY ARTERY DISEASE   . Cough    CURRENT COLD  .  Cystitis   . DEPRESSION   . Diabetes mellitus, type II ([place])   . DIVERTICULOSIS-COLON   . Dysrhythmia    palpitations  . Gastric ulcer 04/2008  . Gastroparesis   . GERD (gastroesophageal reflux disease)   . Hiatal hernia   . Hyperlipidemia   . Hypertension   . Hypothyroidism   . INSOMNIA-SLEEP DISORDER-UNSPEC   . Iron deficiency anemia   . Wears glasses     Past Surgical History:  Procedure Laterality Date  . ABDOMINAL HYSTERECTOMY    . ANTERIOR LAT LUMBAR FUSION Left 08/13/2017   Procedure: LEFT SIDED LUMBAR 3-4 LATERAL INTERBODY FUSION WITH INSTRUMENTATION AND ALLOGRAFT;  Surgeon: Phylliss Bob, MD;  Location: [place];  Service: Orthopedics;  Laterality: Left;  LEFT SIDED LUMBAR 3-4 LATERAL INTERBODY FUSION WITH INSTRUMENTATION AND ALLOGRAFT; REQUEST 3 HOURS  . Back Stimulator  07/2018  . BACK SURGERY  03/2016  . BREAST EXCISIONAL BIOPSY Right    40 years ago  . CHOLECYSTECTOMY  06/2009  . COLONOSCOPY    . ESOPHAGOGASTRODUODENOSCOPY    . EYE SURGERY Bilateral    lasik  . Gastric Wedge resection lipoma  11/2007   x2 with laparotomy and gastrostomy  . JOINT REPLACEMENT    . Left knee replacement    . Rigth knee replacement with revision  04/2008   Dr. Berenice Primas  . ROTATOR CUFF REPAIR Left 01/2009  . s/p bladder surgury  09/2009   Dr. Terance Hart  . SPINAL CORD STIMULATOR INSERTION N/A 09/11/2018   Procedure: LUMBAR SPINAL CORD STIMULATOR INSERTION;  Surgeon: Clydell Hakim, MD;  Location: [place];  Service: Neurosurgery;  Laterality: N/A;  LUMBAR SPINAL CORD STIMULATOR INSERTION    Family History  Problem Relation Age of Onset  . Diabetes Sister        x 3  . Heart disease Sister        x2  . Breast cancer Sister 35  . Diabetes Brother        x3  . Heart disease Brother        x2  . Coronary artery disease Other        female 1st degree  . Hypertension Other   . Breast cancer Sister 23  . Breast cancer Other 25  . Diabetes Brother   . Colon cancer Neg Hx     Social  History:  reports that she has quit smoking. She  has never used smokeless tobacco. She reports that she does not drink alcohol or use drugs.  Review of Systems:  Hypertension: Her blood pressure is treated with losartan 100 mg and clonidine 0.1 mg tablets, also on metoprolol  Followed by nephrologist and PCP  BP Readings from Last 3 Encounters:  12/28/19 110/70  12/12/19 (!) 187/118  10/20/19 (!) 170/90     CKD: Her creatinine is variable, followed by nephrologist Now her creatinine is higher but she has had significant hypoglycemia and decreased intake  She also had problems with urinary retention earlier this months  Lab Results  Component Value Date   CREATININE 1.95 (H) 12/27/2019   CREATININE 1.52 (H) 12/12/2019   CREATININE 1.37 (H) 10/20/2019   CREATININE 1.38 (H) 09/08/2019   Lab Results  Component Value Date   K 4.2 12/27/2019     Lipids: Followed by PCP, LDL has been variably controlled  Taking Lipitor 40 mg  Her lipids are much worse, does also have significant hypothyroidism currently  Lab Results  Component Value Date   CHOL 313 (H) 12/27/2019   HDL 72.50 12/27/2019   LDLCALC 208 (H) 12/27/2019   LDLDIRECT 156.6 01/07/2011   TRIG 159.0 (H) 12/27/2019   CHOLHDL 4 12/27/2019     Post ablative hypothyroidism:  Previously she had I-131 treatment on 08/20/11 for Graves' disease  Her dose has been adjusted periodically  She has been taking 125 mg levothyroxine prescription consistently and has been taking 1-1/2 on Saturdays and 1 pill on the other days   TSH was low normal previously and sugars told to reduce her regimen by half tablet weekly She is quite sure she is not missing any doses She did not have any vomiting after ingesting her tablet in the mornings with a sip of water Has not been running out of refills  Does feel significantly fatigued, also has cold intolerance which is chronic TSH is much higher than usual  Lab Results  Component  Value Date   TSH 53.70 (H) 12/27/2019   TSH 0.51 10/20/2019   TSH 1.88 09/08/2019   FREET4 0.81 09/08/2019   FREET4 0.95 06/21/2019   FREET4 0.86 04/22/2019    On Gabapentin 300 mg for neuropathy    Examination:   BP 110/70 (BP Location: Left Arm, Patient Position: Sitting, Cuff Size: Normal)   Pulse (!) 127   Ht '5\' 6"'$  (1.676 m)   Wt 291 lb (132 kg)   SpO2 97%   BMI 46.97 kg/m   Body mass index is 46.97 kg/m.   No ankle edema present   ASSESSMENT/ PLAN:   Diabetes type 2,  with obesity:   See history of present illness for detailed discussion of current management, blood sugar patterns and problems identified  A1C is now 9%  She appears to be much more insulin resistant She may have had significant benefit with Metformin which has been withheld because of renal dysfunction Despite significant decrease in appetite her blood sugars are over 304 100 She usually cannot afford brand-name medications and some of her medications are on patient assistance program Since her blood sugars are fairly consistently high throughout the day without postprandial increase she will benefit from a much higher dose of basal insulin However her husband does not want to purchase Lantus as yet because of high cost Also may have some effects of Soliqua on her appetite although previously had tolerated this well   Recommendations for diabetes:  She was told to follow the written  instructions for her insulin on her visit summary  Will take Humalog mix insulin 30 units and start 20 units twice a day  Sample of [name] given and she will use this 30 units daily but increase the dose by 5 units every 3 days until morning sugars are below 150 at least  Discussed blood sugar targets  She needs to check her blood sugars much more regularly and can still continue to use the freestyle libre  Needs to increase her food and fluid intake and also getting protein supplements such as diabetic  boost  She will call if she has persistent hyperglycemia  Hypothyroidism: Taking 125 mcg prescription Is now taking 7-1/2 tablets per week and she thinks she has a right prescription Even though she thinks she is not missing her doses, running out of refills or not retaining her tablets after taking it in the morning her TSH is markedly increased and unable to explain this  She will temporarily take 1-1/2 tablets daily of her levothyroxine  Hypertension/renal insufficiency: Blood pressure is much lower and likely to be related to hyperglycemia and dehydration  Temporarily needs to stop losartan and increase fluid intake  NAUSEA: May have gastroparesis and she will need to be evaluated by PCP as soon as possible, currently taking Zofran and not relieved  History of urinary retention: She needs to likely be evaluated by urology  Patient Instructions  Thyroid pills, take 1 1/2 daily  [name] will be taking once a day  Start with 30 units every evening and increase by 5 units every 3 days until morning sugar is below 150 Call for prescription for this or Lantus when finished  HUMALOG mix: Increase the dose to 30 units twice a day Do not take NovoLog or Soliqua for now  Make sure you check your sugars 3 times a day or more and call if blood sugar is not improving in the next week INCREASE fluids, any sugar-free liquids Start using diabetic boost or other low sugar protein drinks   Call for appointment with PCP      Elayne Snare 12/28/2019, 4:59 PM   Note: This office note was prepared with Dragon voice recognition system technology. Any transcriptional errors that result from this process are unintentional.

## 2019-12-28 NOTE — Patient Instructions (Addendum)
Thyroid pills, take 1 1/2 daily  [name] will be taking once a day  Start with 30 units every evening and increase by 5 units every 3 days until morning sugar is below 150 Call for prescription for this or Lantus when finished  HUMALOG mix: Increase the dose to 30 units twice a day Do not take NovoLog or Soliqua for now  Make sure you check your sugars 3 times a day or more and call if blood sugar is not improving in the next week INCREASE fluids, any sugar-free liquids Start using diabetic boost or other low sugar protein drinks   Call for appointment with PCP

## 2019-12-30 ENCOUNTER — Ambulatory Visit (INDEPENDENT_AMBULATORY_CARE_PROVIDER_SITE_OTHER): Payer: Medicare Other | Admitting: Internal Medicine

## 2019-12-30 ENCOUNTER — Other Ambulatory Visit: Payer: Self-pay

## 2019-12-30 ENCOUNTER — Encounter: Payer: Self-pay | Admitting: Internal Medicine

## 2019-12-30 VITALS — BP 130/84 | HR 76 | Temp 98.2°F | Ht 66.0 in | Wt 291.0 lb

## 2019-12-30 DIAGNOSIS — N184 Chronic kidney disease, stage 4 (severe): Secondary | ICD-10-CM

## 2019-12-30 DIAGNOSIS — N179 Acute kidney failure, unspecified: Secondary | ICD-10-CM | POA: Diagnosis not present

## 2019-12-30 DIAGNOSIS — R112 Nausea with vomiting, unspecified: Secondary | ICD-10-CM

## 2019-12-30 DIAGNOSIS — E119 Type 2 diabetes mellitus without complications: Secondary | ICD-10-CM

## 2019-12-30 MED ORDER — ONDANSETRON HCL 4 MG PO TABS: 4.0000 mg | ORAL_TABLET | Freq: Three times a day (TID) | ORAL | 1 refills | Status: DC | PRN

## 2019-12-30 NOTE — Assessment & Plan Note (Signed)
For zofran prn,  to f/u any worsening symptoms or concerns

## 2019-12-30 NOTE — Assessment & Plan Note (Signed)
Likely low volume related to n/v and low po intake and persistent elevated sugars, for increased fluids with anti-emetic prn and f/u 1 wk with lab  I spent 40 minutes in preparing to see the patient by review of recent labs, imaging and procedures, obtaining and reviewing separately obtained history, communicating with the patient and family or caregiver, ordering medications, tests or procedures, and documenting clinical information in the EHR including the differential Dx, treatment, and any further evaluation and other management of AKI on CKD, N/V,

## 2019-12-30 NOTE — Patient Instructions (Addendum)
Please take all new medication as prescribed - the nausea medication  Please return in 1 week for repeat kidney blood testing  Please continue all other medications as before, and refills have been done if requested.  Please have the pharmacy call with any other refills you may need.  Please keep your appointments with your specialists as you may have planned  Please make an Appointment to return in 3 months, or sooner if needed

## 2019-12-30 NOTE — Progress Notes (Signed)
Subjective:    Patient ID: [name], female    DOB: 1951-02-18, 69 y.o.   MRN: [id number]  HPI  Here with recent illness with reported recurrent UTI and urinary retention tx per urology involving 1 wk foley catheter and macrobid, then more recent bactrim which has seemed to help, but with recurrent n/v as well.  Since mar 19 covid vaccination #2 which she blames, she has had nausea, chills, low appetite, fatigue, remained in bed last 4 days with weakness, somewhat improved today and able to get here. Also had labs 3/22 with Dr Dwyane Dee c/w na 131, glc 409, cr 1.95 (over baseline 1.3-1.5), LDL 208 (over baseline < 100) TSH 53 (over baseline normal labs), a1c 9.0.  Pt states has not been able to keep meds down for last several wks many times due to nausea.   Past Medical History:  Diagnosis Date  . Anxiety   . Arthritis   . Blood dyscrasia    "FREE BLEEDER"  . CERVICAL RADICULOPATHY, LEFT   . Chronic back pain   . CKD (chronic kidney disease)    DR. SANFORD  [facility]  . COMMON MIGRAINE   . CORONARY ARTERY DISEASE   . Cough    CURRENT COLD  . Cystitis   . DEPRESSION   . Diabetes mellitus, type II ([place])   . DIVERTICULOSIS-COLON   . Dysrhythmia    palpitations  . Gastric ulcer 04/2008  . Gastroparesis   . GERD (gastroesophageal reflux disease)   . Hiatal hernia   . Hyperlipidemia   . Hypertension   . Hypothyroidism   . INSOMNIA-SLEEP DISORDER-UNSPEC   . Iron deficiency anemia   . Wears glasses    Past Surgical History:  Procedure Laterality Date  . ABDOMINAL HYSTERECTOMY    . ANTERIOR LAT LUMBAR FUSION Left 08/13/2017   Procedure: LEFT SIDED LUMBAR 3-4 LATERAL INTERBODY FUSION WITH INSTRUMENTATION AND ALLOGRAFT;  Surgeon: Phylliss Bob, MD;  Location: [facility];  Service: Orthopedics;  Laterality: Left;  LEFT SIDED LUMBAR 3-4 LATERAL INTERBODY FUSION WITH INSTRUMENTATION AND ALLOGRAFT; REQUEST 3 HOURS  . Back Stimulator  07/2018  . BACK SURGERY  03/2016  . BREAST  EXCISIONAL BIOPSY Right    40 years ago  . CHOLECYSTECTOMY  06/2009  . COLONOSCOPY    . ESOPHAGOGASTRODUODENOSCOPY    . EYE SURGERY Bilateral    lasik  . Gastric Wedge resection lipoma  11/2007   x2 with laparotomy and gastrostomy  . JOINT REPLACEMENT    . Left knee replacement    . Rigth knee replacement with revision  04/2008   Dr. Berenice Primas  . ROTATOR CUFF REPAIR Left 01/2009  . s/p bladder surgury  09/2009   Dr. Terance Hart  . SPINAL CORD STIMULATOR INSERTION N/A 09/11/2018   Procedure: LUMBAR SPINAL CORD STIMULATOR INSERTION;  Surgeon: Clydell Hakim, MD;  Location: [facility];  Service: Neurosurgery;  Laterality: N/A;  LUMBAR SPINAL CORD STIMULATOR INSERTION    reports that she has quit smoking. She has never used smokeless tobacco. She reports that she does not drink alcohol or use drugs. family history includes Breast cancer (age of onset: 28) in an other family member; Breast cancer (age of onset: 42) in her sister; Breast cancer (age of onset: 87) in her sister; Coronary artery disease in an other family member; Diabetes in her brother, brother, and sister; Heart disease in her brother and sister; Hypertension in an other family member. Allergies  Allergen Reactions  . Ciprofloxacin Shortness Of Breath  and Other (See Comments)    Dizziness   . Hydrocodone Shortness Of Breath and Other (See Comments)    Tachycardia  . Penicillins Hives    Pt just remembers hives Has patient had a PCN reaction causing immediate rash, facial/tongue/throat swelling, SOB or lightheadedness with hypotension: No Has patient had a PCN reaction causing severe rash involving mucus membranes or skin necrosis: No Has patient had a PCN reaction that required hospitalization No Has patient had a PCN reaction occurring within the last 10 years: No If all of the above answers are "NO", then may proceed with Cephalosporin use.    Current Outpatient Medications on File Prior to Visit  Medication Sig Dispense Refill  .  aspirin 81 MG EC tablet Take 81 mg by mouth every evening.     Marland Kitchen atorvastatin (LIPITOR) 40 MG tablet TAKE ONE TABLET BY MOUTH EVERY NIGHT AT BEDTIME 90 tablet 1  . buPROPion (WELLBUTRIN XL) 150 MG 24 hr tablet Take 1 tablet (150 mg total) by mouth daily. 90 tablet 3  . cloNIDine (CATAPRES) 0.1 MG tablet TAKE ONE TABLET BY MOUTH TWICE A DAY 60 tablet 0  . Continuous Blood Gluc Receiver (FREESTYLE LIBRE 14 DAY READER) DEVI by Does not apply route.    Marland Kitchen ELMIRON 100 MG capsule Take 200 mg by mouth 2 (two) times daily.     Marland Kitchen FLUoxetine (PROZAC) 20 MG capsule TAKE ONE CAPSULE BY MOUTH DAILY 90 capsule 1  . gabapentin (NEURONTIN) 300 MG capsule Take 300 mg by mouth 3 (three) times daily.     Marland Kitchen glucose blood (FREESTYLE TEST STRIPS) test strip Use as instructed to check blood sugar 4 times daily. 100 each 3  . hydrOXYzine (ATARAX/VISTARIL) 10 MG tablet     . insulin aspart (NOVOLOG FLEXPEN) 100 UNIT/ML FlexPen Inject 5-10 Units into the skin 3 (three) times daily with meals. Inject 5-10 units under the skin three times daily before meals.    . Insulin Glargine-Lixisenatide (SOLIQUA) 100-33 UNT-MCG/ML SOPN Inject 26 Units into the skin daily with breakfast. 5 pen 3  . Insulin Lispro Prot & Lispro (HUMALOG MIX 75/25 KWIKPEN) (75-25) 100 UNIT/ML Kwikpen Inject 20 Units into the skin daily with breakfast. 15 mL 11  . levothyroxine (SYNTHROID) 125 MCG tablet Take 1 tablet by mouth daily, Sunday-Friday. On Saturday, take 1.5 tablets. 120 tablet 1  . linaclotide (LINZESS) 72 MCG capsule Take 1 capsule (72 mcg total) by mouth daily before breakfast. 30 capsule 11  . metoprolol succinate (TOPROL-XL) 50 MG 24 hr tablet TAKE 1 TABLET BY MOUTH DAILY WITH OR IMMEDIATELY FOLLOWING A MEAL 30 tablet 3  . Na Sulfate-K Sulfate-Mg Sulf (SUPREP BOWEL PREP KIT) 17.5-3.13-1.6 GM/177ML SOLN Take 1 kit by mouth as directed. For colonoscopy prep 354 mL 0  . naltrexone (DEPADE) 50 MG tablet Take 1 tablet (50 mg total) by mouth daily.  90 tablet 3  . pantoprazole (PROTONIX) 40 MG tablet TAKE 1 TABLET(40 MG) BY MOUTH TWICE DAILY 60 tablet 3  . traMADol (ULTRAM) 50 MG tablet Take 1 tablet (50 mg total) by mouth every 6 (six) hours as needed. 8 tablet 0  . losartan (COZAAR) 100 MG tablet Take one tab daily (Patient not taking: Reported on 12/30/2019) 90 tablet 1  . promethazine (PHENERGAN) 25 MG tablet Take 1 tablet (25 mg total) by mouth every 8 (eight) hours as needed for nausea or vomiting. (Patient not taking: Reported on 12/30/2019) 40 tablet 1   No current facility-administered medications on file  prior to visit.   Review of Systems All otherwise neg per pt     Objective:   Physical Exam BP 130/84   Pulse 76   Temp 98.2 F (36.8 C)   Ht '5\' 6"'$  (1.676 m)   Wt 291 lb (132 kg)   SpO2 99%   BMI 46.97 kg/m  VS noted, morbid obese, fatigued, ill and generally weak appearing Constitutional: Pt appears in NAD HENT: Head: NCAT.  Right Ear: External ear normal.  Left Ear: External ear normal.  Eyes: . Pupils are equal, round, and reactive to light. Conjunctivae and EOM are normal Nose: without d/c or deformity Neck: Neck supple. Gross normal ROM Cardiovascular: Normal rate and regular rhythm.   Pulmonary/Chest: Effort normal and breath sounds without rales or wheezing.  Abd:  Soft, NT, ND, + BS, no flank tender Neurological: Pt is alert. At baseline orientation, motor grossly intact Skin: Skin is warm. No rashes, other new lesions Psychiatric: Pt behavior is normal without agitation  All otherwise neg per pt Lab Results  Component Value Date   WBC 7.1 12/12/2019   HGB 10.9 (L) 12/12/2019   HCT 35.8 (L) 12/12/2019   PLT 234 12/12/2019   GLUCOSE 409 (H) 12/27/2019   CHOL 313 (H) 12/27/2019   TRIG 159.0 (H) 12/27/2019   HDL 72.50 12/27/2019   LDLDIRECT 156.6 01/07/2011   LDLCALC 208 (H) 12/27/2019   ALT 13 12/27/2019   AST 16 12/27/2019   NA 131 (L) 12/27/2019   K 4.2 12/27/2019   CL 97 12/27/2019    CREATININE 1.95 (H) 12/27/2019   BUN 12 12/27/2019   CO2 26 12/27/2019   TSH 53.70 (H) 12/27/2019   INR 1.21 08/07/2017   HGBA1C 9.0 (H) 12/27/2019   MICROALBUR <0.7 04/22/2019          Assessment & Plan:

## 2019-12-30 NOTE — Assessment & Plan Note (Signed)
Cont f/u endo as planned

## 2020-01-12 ENCOUNTER — Other Ambulatory Visit: Payer: Self-pay | Admitting: Endocrinology

## 2020-01-12 ENCOUNTER — Telehealth: Payer: Self-pay | Admitting: Endocrinology

## 2020-01-12 NOTE — Telephone Encounter (Signed)
Patient been having high sugars every morning (in the 300's) and very dehydrated. Requests Olen Cordial to please call her regarding this. Ph# [phone]

## 2020-01-12 NOTE — Telephone Encounter (Signed)
He was supposed to be taking [place] 30 units once a day and Humalog mix 30 units twice daily and not any NovoLog or Soliqua.  Need to know what her blood sugars are doing before or after dinner

## 2020-01-12 NOTE — Telephone Encounter (Signed)
Called pt and left voicemail requesting call back to get additional blood sugars.

## 2020-01-12 NOTE — Telephone Encounter (Signed)
Are you taking any type of steroids?   Are you sick or becoming sick? No  Is this the first elevated blood sugar reading? No  Have you tried to correct it with insulin? Yes - Pt has been taking 10-15 units extra  Have you been taking/taken today all of the prescribed medications? Yes  What is your current diabetic insulin or oral medication dosage? Novolog 10 units SQ around 11am, and 10 units before the next meal.   Date Time Reading Notes       01/12/20 0914 243   01/12/20 1121 359   01/11/20 1201 330   01/11/20 0900 138   01/10/20 1230 247   01/10/20 0715 351   01/10/20 0925 388                         Please advise Pt would like a call on her cell number to discuss what she should do. [phone]

## 2020-01-13 ENCOUNTER — Other Ambulatory Visit: Payer: Self-pay | Admitting: Endocrinology

## 2020-01-13 MED ORDER — LANTUS SOLOSTAR 100 UNIT/ML ~~LOC~~ SOPN: 30.0000 [IU] | PEN_INJECTOR | Freq: Every day | SUBCUTANEOUS | 0 refills | Status: DC

## 2020-01-13 NOTE — Telephone Encounter (Signed)
Blood sugars after dinner have been around 400.  It appears that at the last visit, [name] was never sent to pharmacy.

## 2020-01-13 NOTE — Telephone Encounter (Signed)
She was apparently given [place] sample.  Not clear if she is taking this.  If she cannot afford this she will need to start Lantus insulin 30 units daily.  Also increase Humalog mix to 30 units twice daily and not take NovoLog

## 2020-01-14 NOTE — Telephone Encounter (Signed)
Called pt and gave her MD message. Pt stated that she has not started taking [place] because she was "so weak". Pt was advised that her blood sugars are most likely high now because she stopped taking Soliqua but did not replace it with the new medication. Pt will start taking insulin today and increase Humalog mix as instructed.

## 2020-01-18 ENCOUNTER — Other Ambulatory Visit: Payer: Self-pay

## 2020-01-18 ENCOUNTER — Telehealth: Payer: Self-pay | Admitting: *Deleted

## 2020-01-18 NOTE — Telephone Encounter (Signed)
She can start NovoLog 10 units only if blood sugars are going over 180 after meals

## 2020-01-18 NOTE — Telephone Encounter (Signed)
Patient has called back stating her sugars are still low and requests a nurse to give her a call. [phone]

## 2020-01-18 NOTE — Telephone Encounter (Signed)
Does she need to take mealtime insulin at each meal, regardless of blood sugar before the meal?

## 2020-01-18 NOTE — Telephone Encounter (Signed)
Left message informing pt she could contact Brent at [phone] to schedule and it may be at one of the [facility].

## 2020-01-18 NOTE — Telephone Encounter (Signed)
Pt states she is having numbness in the foot, big toe and ankle, and wants the [place] scheduled at the [facility] center.

## 2020-01-18 NOTE — Telephone Encounter (Signed)
Called pt and left voicemail requesting a call back. °

## 2020-01-18 NOTE — Telephone Encounter (Signed)
Called pt and she stated that last night her sugar was 52 before bed and 46 when she woke up. Sugar is currently 140.  She has only been taking 30 units of [place]. Pt is still not taking mealtime insulin as prescribed. She only took 10 units once yesterday.

## 2020-01-18 NOTE — Telephone Encounter (Signed)
She can reduce her [name] down to 20 units

## 2020-01-19 NOTE — Telephone Encounter (Signed)
Pt returned phone call and she was given MD message. Pt verbalized understanding and did not voice any other comments or concerns at this time.

## 2020-01-19 NOTE — Telephone Encounter (Signed)
Called pt again and she did not answer. Left voicemail requesting a call back again.

## 2020-01-20 ENCOUNTER — Other Ambulatory Visit: Payer: Self-pay

## 2020-01-20 MED ORDER — LANTUS SOLOSTAR 100 UNIT/ML ~~LOC~~ SOPN: 20.0000 [IU] | PEN_INJECTOR | Freq: Every day | SUBCUTANEOUS | 0 refills | Status: DC

## 2020-01-20 NOTE — Telephone Encounter (Signed)
Patient called stating her [name] has been misplaced and requests a new one...looking at the conversation im not sure if you want something else in replace of this or is she still on the sample tresiba? [phone]

## 2020-01-20 NOTE — Telephone Encounter (Signed)
She will be needing either a prescription for Tresiba or Lantus whichever is less expensive

## 2020-01-20 NOTE — Telephone Encounter (Signed)
Do you want to give pt a new sample or send Rx?

## 2020-01-20 NOTE — Telephone Encounter (Signed)
Called pt and gave her MD message that Lantus was sen to pharmacy. [name] is not covered under insurance plan.

## 2020-01-24 DIAGNOSIS — M961 Postlaminectomy syndrome, not elsewhere classified: Secondary | ICD-10-CM | POA: Diagnosis not present

## 2020-01-24 DIAGNOSIS — Z9689 Presence of other specified functional implants: Secondary | ICD-10-CM | POA: Diagnosis not present

## 2020-01-25 ENCOUNTER — Emergency Department (HOSPITAL_COMMUNITY): Payer: Medicare Other

## 2020-01-25 ENCOUNTER — Inpatient Hospital Stay (HOSPITAL_COMMUNITY)
Admission: EM | Admit: 2020-01-25 | Discharge: 2020-01-27 | DRG: 313 | Disposition: A | Discharge status: Home Health | Payer: Medicare Other | Attending: Internal Medicine | Admitting: Internal Medicine

## 2020-01-25 ENCOUNTER — Other Ambulatory Visit: Payer: Self-pay

## 2020-01-25 ENCOUNTER — Encounter: Payer: Self-pay | Admitting: Endocrinology

## 2020-01-25 ENCOUNTER — Ambulatory Visit (INDEPENDENT_AMBULATORY_CARE_PROVIDER_SITE_OTHER): Payer: Medicare Other | Admitting: Endocrinology

## 2020-01-25 ENCOUNTER — Encounter (HOSPITAL_COMMUNITY): Payer: Self-pay | Admitting: Emergency Medicine

## 2020-01-25 VITALS — BP 152/72 | HR 90 | Ht 66.0 in | Wt 299.2 lb

## 2020-01-25 DIAGNOSIS — Z794 Long term (current) use of insulin: Secondary | ICD-10-CM

## 2020-01-25 DIAGNOSIS — M5412 Radiculopathy, cervical region: Secondary | ICD-10-CM | POA: Diagnosis present

## 2020-01-25 DIAGNOSIS — Z20822 Contact with and (suspected) exposure to covid-19: Secondary | ICD-10-CM | POA: Diagnosis present

## 2020-01-25 DIAGNOSIS — G4700 Insomnia, unspecified: Secondary | ICD-10-CM | POA: Diagnosis present

## 2020-01-25 DIAGNOSIS — E890 Postprocedural hypothyroidism: Secondary | ICD-10-CM | POA: Diagnosis not present

## 2020-01-25 DIAGNOSIS — R072 Precordial pain: Secondary | ICD-10-CM | POA: Diagnosis not present

## 2020-01-25 DIAGNOSIS — Z833 Family history of diabetes mellitus: Secondary | ICD-10-CM

## 2020-01-25 DIAGNOSIS — F419 Anxiety disorder, unspecified: Secondary | ICD-10-CM | POA: Diagnosis not present

## 2020-01-25 DIAGNOSIS — E039 Hypothyroidism, unspecified: Secondary | ICD-10-CM | POA: Diagnosis present

## 2020-01-25 DIAGNOSIS — E785 Hyperlipidemia, unspecified: Secondary | ICD-10-CM | POA: Diagnosis present

## 2020-01-25 DIAGNOSIS — Z87891 Personal history of nicotine dependence: Secondary | ICD-10-CM

## 2020-01-25 DIAGNOSIS — Z880 Allergy status to penicillin: Secondary | ICD-10-CM

## 2020-01-25 DIAGNOSIS — I152 Hypertension secondary to endocrine disorders: Secondary | ICD-10-CM | POA: Diagnosis present

## 2020-01-25 DIAGNOSIS — E1169 Type 2 diabetes mellitus with other specified complication: Secondary | ICD-10-CM

## 2020-01-25 DIAGNOSIS — Z885 Allergy status to narcotic agent status: Secondary | ICD-10-CM

## 2020-01-25 DIAGNOSIS — Z6841 Body Mass Index (BMI) 40.0 and over, adult: Secondary | ICD-10-CM | POA: Diagnosis not present

## 2020-01-25 DIAGNOSIS — N289 Disorder of kidney and ureter, unspecified: Secondary | ICD-10-CM | POA: Diagnosis not present

## 2020-01-25 DIAGNOSIS — M1990 Unspecified osteoarthritis, unspecified site: Secondary | ICD-10-CM | POA: Diagnosis present

## 2020-01-25 DIAGNOSIS — K219 Gastro-esophageal reflux disease without esophagitis: Secondary | ICD-10-CM | POA: Diagnosis present

## 2020-01-25 DIAGNOSIS — E1122 Type 2 diabetes mellitus with diabetic chronic kidney disease: Secondary | ICD-10-CM | POA: Diagnosis present

## 2020-01-25 DIAGNOSIS — I2510 Atherosclerotic heart disease of native coronary artery without angina pectoris: Secondary | ICD-10-CM | POA: Diagnosis present

## 2020-01-25 DIAGNOSIS — E1165 Type 2 diabetes mellitus with hyperglycemia: Secondary | ICD-10-CM

## 2020-01-25 DIAGNOSIS — Z981 Arthrodesis status: Secondary | ICD-10-CM

## 2020-01-25 DIAGNOSIS — N1832 Chronic kidney disease, stage 3b: Secondary | ICD-10-CM | POA: Diagnosis present

## 2020-01-25 DIAGNOSIS — Z888 Allergy status to other drugs, medicaments and biological substances status: Secondary | ICD-10-CM

## 2020-01-25 DIAGNOSIS — D631 Anemia in chronic kidney disease: Secondary | ICD-10-CM | POA: Diagnosis present

## 2020-01-25 DIAGNOSIS — Z79899 Other long term (current) drug therapy: Secondary | ICD-10-CM

## 2020-01-25 DIAGNOSIS — R079 Chest pain, unspecified: Secondary | ICD-10-CM | POA: Diagnosis not present

## 2020-01-25 DIAGNOSIS — R0602 Shortness of breath: Secondary | ICD-10-CM | POA: Diagnosis not present

## 2020-01-25 DIAGNOSIS — Z8249 Family history of ischemic heart disease and other diseases of the circulatory system: Secondary | ICD-10-CM

## 2020-01-25 DIAGNOSIS — E119 Type 2 diabetes mellitus without complications: Secondary | ICD-10-CM

## 2020-01-25 DIAGNOSIS — I10 Essential (primary) hypertension: Secondary | ICD-10-CM | POA: Diagnosis present

## 2020-01-25 DIAGNOSIS — R0789 Other chest pain: Secondary | ICD-10-CM | POA: Diagnosis not present

## 2020-01-25 DIAGNOSIS — K5900 Constipation, unspecified: Secondary | ICD-10-CM | POA: Diagnosis present

## 2020-01-25 DIAGNOSIS — Z7982 Long term (current) use of aspirin: Secondary | ICD-10-CM

## 2020-01-25 DIAGNOSIS — F329 Major depressive disorder, single episode, unspecified: Secondary | ICD-10-CM | POA: Diagnosis present

## 2020-01-25 DIAGNOSIS — Z79890 Hormone replacement therapy (postmenopausal): Secondary | ICD-10-CM

## 2020-01-25 DIAGNOSIS — E1159 Type 2 diabetes mellitus with other circulatory complications: Secondary | ICD-10-CM | POA: Diagnosis present

## 2020-01-25 DIAGNOSIS — Z96652 Presence of left artificial knee joint: Secondary | ICD-10-CM | POA: Diagnosis present

## 2020-01-25 LAB — CBC
HCT: 30 % — ABNORMAL LOW (ref 36.0–46.0)
Hemoglobin: 9 g/dL — ABNORMAL LOW (ref 12.0–15.0)
MCH: 32 pg (ref 26.0–34.0)
MCHC: 30 g/dL (ref 30.0–36.0)
MCV: 106.8 fL — ABNORMAL HIGH (ref 80.0–100.0)
Platelets: 260 10*3/uL (ref 150–400)
RBC: 2.81 MIL/uL — ABNORMAL LOW (ref 3.87–5.11)
RDW: 13 % (ref 11.5–15.5)
WBC: 7.6 10*3/uL (ref 4.0–10.5)
nRBC: 0 % (ref 0.0–0.2)

## 2020-01-25 LAB — BASIC METABOLIC PANEL
Anion gap: 8 (ref 5–15)
BUN: 19 mg/dL (ref 8–23)
CO2: 23 mmol/L (ref 22–32)
Calcium: 9.5 mg/dL (ref 8.9–10.3)
Chloride: 106 mmol/L (ref 98–111)
Creatinine, Ser: 1.78 mg/dL — ABNORMAL HIGH (ref 0.44–1.00)
GFR calc Af Amer: 33 mL/min — ABNORMAL LOW (ref >60)
GFR calc non Af Amer: 29 mL/min — ABNORMAL LOW (ref >60)
Glucose, Bld: 177 mg/dL — ABNORMAL HIGH (ref 70–99)
Potassium: 4.7 mmol/L (ref 3.5–5.1)
Sodium: 137 mmol/L (ref 135–145)

## 2020-01-25 LAB — TROPONIN I (HIGH SENSITIVITY)
Troponin I (High Sensitivity): 5 ng/L (ref <18)
Troponin I (High Sensitivity): 5 ng/L (ref <18)

## 2020-01-25 MED ORDER — SODIUM CHLORIDE 0.9% FLUSH
3.0000 mL | Freq: Once | INTRAVENOUS | Status: AC
  Administered 2020-01-26: 3 mL via INTRAVENOUS

## 2020-01-25 NOTE — Progress Notes (Signed)
[name] is a 69 y.o. female.    Reason for Appointment: follow-up   History of Present Illness   Diagnosis: Type 2 DIABETES MELITUS, date of diagnosis: 2000  Previous history: She had initially been treated with metformin and Amaryl and subsequently given Victoza which helped overall control and weight loss. In 9/13 because of significant hyperglycemia she was started on basal bolus insulin also. Had required relatively small doses of insulin. Her blood sugar control has been excellent with A1c between 5.3-6.0 although this may be relatively higher than expected. Tends to have relatively higher readings after supper In 6/15 she was told to resume her Victoza and stop her Lantus since she had been gaining weight with Lantus and NovoLog regimen.   Recent history:     INSULIN regimen: Lantus 20 units before Bfst, Novolin mix 10 bid   Non-insulin hypoglycemic drugs: None   Her A1c was last 9% No recent fructosamine available  Current blood sugar patterns and management:  She has had much more variable blood sugars over the last month  She had been asked to increase her basal insulin to 30 units but more recently she started getting low and is now taking 20 units  Previously had a sample of [place] and now starting Lantus in the morning  She was also recommended Humalog mix insulin but because of cost is now taking OTC Novolin 70/30  She does not always take her premixed insulin before meals causing some significantly high readings after her first meal and occasionally in the evening  Last night after taking her premixed insulin 2 hours after eating her blood sugar got low in the night  However she is checking her blood sugars much more consistently than before with her freestyle libre  Not clear if this is accurate since she does not correlate this with fingersticks    Side effects from medications:  none   Glucometer:  Freestyle  Libre/Accu-Chek   CONTINUOUS GLUCOSE MONITORING RECORD INTERPRETATION    Dates of Recording: 4/7 through 4/20  Sensor description: [name]  Results statistics:   CGM use % of time  88  Average and SD  172 GV 45  Time in range    50    %  % Time Above 180  23  % Time above 250  17  % Time Below target  10    PRE-MEAL Fasting Lunch Dinner Bedtime Overall  Glucose range:       Mean/median:  145   167   172   POST-MEAL PC Breakfast PC Lunch PC Dinner  Glucose range:     Mean/median:  217   176    Glycemic patterns summary: Blood sugars show moderate variability at all times with the highest readings early afternoon between 12 noon-4 PM but not consistent As discussed below she has occasional low episodes also  Hyperglycemic episodes have occurred occasionally overnight, periodically midday or early afternoon and a couple of times late evening  Hypoglycemic episodes occurred overnight on 2 nights has also once before 6 PM and another time after 8 PM  Overnight periods: Blood sugars are quite variable but in the last 4 nights are mostly near normal or slightly low  Preprandial periods: Mealtimes are inconsistent and difficult to identify patterns  Postprandial periods:   Blood sugars are generally going up significantly around midday after her first meal but only occasionally late evening   Previous data:      CGM use %  of time  44  2-week average/SD  204, GV 35  Time in range  34     %, was 51  % Time Above 180  33  % Time above 250  29  % Time Below 70 4     PRE-MEAL Fasting Lunch Dinner Bedtime Overall  Glucose range:    224    Averages:  163     204   POST-MEAL PC Breakfast PC Lunch PC Dinner  Glucose range:     Averages:  213   276   Meals: 1-2 meals per day. Lunch 12 Supper at 5 PM            Physical activity: exercise: Limited, has had fatigue, back and leg pain.            Dietician visit: Most recent: 0/1093            Complications: are:  None  Wt Readings from Last 3 Encounters:  01/25/20 291 lb (132 kg)  01/25/20 299 lb 3.2 oz (135.7 kg)  12/30/19 291 lb (132 kg)    LABS:  Lab Results  Component Value Date   HGBA1C 9.0 (H) 12/27/2019   HGBA1C 7.3 (A) 09/08/2019   HGBA1C 7.0 (A) 05/12/2019   Lab Results  Component Value Date   MICROALBUR <0.7 04/22/2019   LDLCALC 208 (H) 12/27/2019   CREATININE 1.95 (H) 12/27/2019   Lab Results  Component Value Date   FRUCTOSAMINE 401 (H) 10/20/2019   FRUCTOSAMINE 369 (H) 09/08/2019   FRUCTOSAMINE 364 (H) 06/21/2019     Other active problems: See review of systems   No visits with results within 1 Week(s) from this visit.  Latest known visit with results is:  Office Visit on 12/28/2019  Component Date Value Ref Range Status  . POC Glucose 12/28/2019 340* 70 - 99 mg/dl Final    Allergies as of 01/25/2020      Reactions   Ciprofloxacin Shortness Of Breath, Other (See Comments)   Dizziness   Hydrocodone Shortness Of Breath, Other (See Comments)   Tachycardia   Penicillins Hives   Pt just remembers hives Has patient had a PCN reaction causing immediate rash, facial/tongue/throat swelling, SOB or lightheadedness with hypotension: No Has patient had a PCN reaction causing severe rash involving mucus membranes or skin necrosis: No Has patient had a PCN reaction that required hospitalization No Has patient had a PCN reaction occurring within the last 10 years: No If all of the above answers are "NO", then may proceed with Cephalosporin use.      Medication List       Accurate as of January 25, 2020  8:49 PM. If you have any questions, ask your nurse or doctor.        aspirin 81 MG EC tablet Take 81 mg by mouth every evening.   atorvastatin 40 MG tablet Commonly known as: LIPITOR TAKE ONE TABLET BY MOUTH EVERY NIGHT AT BEDTIME   buPROPion 150 MG 24 hr tablet Commonly known as: Wellbutrin XL Take 1 tablet (150 mg total) by mouth daily.   cloNIDine 0.1 MG  tablet Commonly known as: CATAPRES TAKE ONE TABLET BY MOUTH TWICE A DAY   Elmiron 100 MG capsule Generic drug: pentosan polysulfate Take 200 mg by mouth 2 (two) times daily.   FLUoxetine 20 MG capsule Commonly known as: PROZAC TAKE ONE CAPSULE BY MOUTH DAILY   FreeStyle Libre 14 Day Reader Kerrin Mo by Does not apply route.   gabapentin 300  MG capsule Commonly known as: NEURONTIN Take 300 mg by mouth 3 (three) times daily.   glucose blood test strip Commonly known as: FREESTYLE TEST STRIPS Use as instructed to check blood sugar 4 times daily.   hydrOXYzine 10 MG tablet Commonly known as: ATARAX/VISTARIL   Insulin Lispro Prot & Lispro (75-25) 100 UNIT/ML Kwikpen Commonly known as: HumaLOG Mix 75/25 KwikPen Inject 20 Units into the skin daily with breakfast. What changed:   how much to take  when to take this  additional instructions   Lantus SoloStar 100 UNIT/ML Solostar Pen Generic drug: insulin glargine Inject 20 Units into the skin daily.   levothyroxine 125 MCG tablet Commonly known as: SYNTHROID Take 1 tablet by mouth daily, Sunday-Friday. On Saturday, take 1.5 tablets.   linaclotide 72 MCG capsule Commonly known as: Linzess Take 1 capsule (72 mcg total) by mouth daily before breakfast.   losartan 100 MG tablet Commonly known as: COZAAR Take one tab daily   metoprolol succinate 50 MG 24 hr tablet Commonly known as: TOPROL-XL TAKE 1 TABLET BY MOUTH DAILY WITH OR IMMEDIATELY FOLLOWING A MEAL   naltrexone 50 MG tablet Commonly known as: DEPADE Take 1 tablet (50 mg total) by mouth daily.   ondansetron 4 MG tablet Commonly known as: Zofran Take 1 tablet (4 mg total) by mouth every 8 (eight) hours as needed for nausea or vomiting.   pantoprazole 40 MG tablet Commonly known as: PROTONIX TAKE 1 TABLET(40 MG) BY MOUTH TWICE DAILY   promethazine 25 MG tablet Commonly known as: PHENERGAN Take 1 tablet (25 mg total) by mouth every 8 (eight) hours as needed  for nausea or vomiting.   Suprep Bowel Prep Kit 17.5-3.13-1.6 GM/177ML Soln Generic drug: Na Sulfate-K Sulfate-Mg Sulf Take 1 kit by mouth as directed. For colonoscopy prep   traMADol 50 MG tablet Commonly known as: ULTRAM Take 1 tablet (50 mg total) by mouth every 6 (six) hours as needed.       Allergies:  Allergies  Allergen Reactions  . Ciprofloxacin Shortness Of Breath and Other (See Comments)    Dizziness   . Hydrocodone Shortness Of Breath and Other (See Comments)    Tachycardia  . Penicillins Hives    Pt just remembers hives Has patient had a PCN reaction causing immediate rash, facial/tongue/throat swelling, SOB or lightheadedness with hypotension: No Has patient had a PCN reaction causing severe rash involving mucus membranes or skin necrosis: No Has patient had a PCN reaction that required hospitalization No Has patient had a PCN reaction occurring within the last 10 years: No If all of the above answers are "NO", then may proceed with Cephalosporin use.     Past Medical History:  Diagnosis Date  . Anxiety   . Arthritis   . Blood dyscrasia    "FREE BLEEDER"  . CERVICAL RADICULOPATHY, LEFT   . Chronic back pain   . CKD (chronic kidney disease)    DR. SANFORD  [facility]  . COMMON MIGRAINE   . CORONARY ARTERY DISEASE   . Cough    CURRENT COLD  . Cystitis   . DEPRESSION   . Diabetes mellitus, type II ([place])   . DIVERTICULOSIS-COLON   . Dysrhythmia    palpitations  . Gastric ulcer 04/2008  . Gastroparesis   . GERD (gastroesophageal reflux disease)   . Hiatal hernia   . Hyperlipidemia   . Hypertension   . Hypothyroidism   . INSOMNIA-SLEEP DISORDER-UNSPEC   . Iron deficiency anemia   .  Wears glasses     Past Surgical History:  Procedure Laterality Date  . ABDOMINAL HYSTERECTOMY    . ANTERIOR LAT LUMBAR FUSION Left 08/13/2017   Procedure: LEFT SIDED LUMBAR 3-4 LATERAL INTERBODY FUSION WITH INSTRUMENTATION AND ALLOGRAFT;  Surgeon: Phylliss Bob,  MD;  Location: [facility];  Service: Orthopedics;  Laterality: Left;  LEFT SIDED LUMBAR 3-4 LATERAL INTERBODY FUSION WITH INSTRUMENTATION AND ALLOGRAFT; REQUEST 3 HOURS  . Back Stimulator  07/2018  . BACK SURGERY  03/2016  . BREAST EXCISIONAL BIOPSY Right    40 years ago  . CHOLECYSTECTOMY  06/2009  . COLONOSCOPY    . ESOPHAGOGASTRODUODENOSCOPY    . EYE SURGERY Bilateral    lasik  . Gastric Wedge resection lipoma  11/2007   x2 with laparotomy and gastrostomy  . JOINT REPLACEMENT    . Left knee replacement    . Rigth knee replacement with revision  04/2008   Dr. Berenice Primas  . ROTATOR CUFF REPAIR Left 01/2009  . s/p bladder surgury  09/2009   Dr. Terance Hart  . SPINAL CORD STIMULATOR INSERTION N/A 09/11/2018   Procedure: LUMBAR SPINAL CORD STIMULATOR INSERTION;  Surgeon: Clydell Hakim, MD;  Location: [facility];  Service: Neurosurgery;  Laterality: N/A;  LUMBAR SPINAL CORD STIMULATOR INSERTION    Family History  Problem Relation Age of Onset  . Diabetes Sister        x 3  . Heart disease Sister        x2  . Breast cancer Sister 57  . Diabetes Brother        x3  . Heart disease Brother        x2  . Coronary artery disease Other        female 1st degree  . Hypertension Other   . Breast cancer Sister 27  . Breast cancer Other 25  . Diabetes Brother   . Colon cancer Neg Hx     Social History:  reports that she has quit smoking. She has never used smokeless tobacco. She reports that she does not drink alcohol or use drugs.  Review of Systems:  Hypertension: Her blood pressure is treated with losartan 100 mg and clonidine 0.1 mg tablets, also on metoprolol  Followed by nephrologist and PCP  BP Readings from Last 3 Encounters:  01/25/20 (!) 169/82  01/25/20 (!) 152/72  12/30/19 130/84     CKD: Her creatinine is variable, followed by nephrologist Creatinine is higher as of 3/22  Lab Results  Component Value Date   CREATININE 1.95 (H) 12/27/2019   CREATININE 1.52 (H) 12/12/2019    CREATININE 1.37 (H) 10/20/2019   CREATININE 1.38 (H) 09/08/2019   Lab Results  Component Value Date   K 4.2 12/27/2019     Lipids: Followed by PCP, LDL has been variably controlled  Taking Lipitor 40 mg  Her lipids are much worse, does also have significant hypothyroidism on this visit  Lab Results  Component Value Date   CHOL 313 (H) 12/27/2019   HDL 72.50 12/27/2019   LDLCALC 208 (H) 12/27/2019   LDLDIRECT 156.6 01/07/2011   TRIG 159.0 (H) 12/27/2019   CHOLHDL 4 12/27/2019     Post ablative hypothyroidism:  Previously she had I-131 treatment on 08/20/11 for Graves' disease  Her dose has been adjusted periodically  She has been taking 125 mg levothyroxine prescription consistently and has been taking 1-1/2 on Saturdays and 1 pill on the other days   TSH was low normal previously and sugars told to  reduce her regimen by half tablet weekly However for some reason her TSH was much higher in March and she was significantly symptomatic with fatigue Also was having some vomiting at that time  Now she is taking 1-1/2 tablets daily of the 125 mcg dose and has taken this consistently the mornings  Lab Results  Component Value Date   TSH 53.70 (H) 12/27/2019   TSH 0.51 10/20/2019   TSH 1.88 09/08/2019   FREET4 0.81 09/08/2019   FREET4 0.95 06/21/2019   FREET4 0.86 04/22/2019    On Gabapentin 300 mg for neuropathy    Examination:   BP (!) 152/72 (BP Location: Left Arm, Patient Position: Sitting, Cuff Size: Large)   Pulse 90   Ht '5\' 6"'$  (1.676 m)   Wt 299 lb 3.2 oz (135.7 kg)   SpO2 98%   BMI 48.29 kg/m   Body mass index is 48.29 kg/m.   No ankle edema evident   ASSESSMENT/ PLAN:   Diabetes type 2,  with obesity:   See history of present illness for detailed discussion of current management, blood sugar patterns and problems identified  A1C is recently 9%  Even though she had appeared to require a lot of insulin on her last visit and she was recommended a  total of 90 units daily her blood sugars are much lower in the last few days This is despite taking only a total of 40 units of insulin a day May be benefiting from using premixed insulin instead of only basal and bolus  Her blood sugars still tend to be highest midday and afternoon but this is partly related to her not taking her premixed insulin before eating Fasting readings are still fairly good with 20 units of Lantus despite not using [place] She may have had some nausea with [place] which is resolving  Recommendations for diabetes:  She was advised to check blood sugars consistently at all different time Emphasized the need to take her premixed insulin well before starting to eat and she needs to tell her husband to remind her to do so; also advised her that late administration of her premixed insulin may cause nocturnal hypoglycemia She will also need to reduce Lantus to 18 units to prevent overnight hypoglycemia Blood sugar targets were discussed She will try to avoid high fat or high carbohydrate meals  Hypothyroidism: Taking 125 mcg, 1-1/2 tablets daily because of very high TSH Not clear if she was having some vomiting problem gastroparesis causing inadequate absorption Labs need to be checked again  Hypertension/renal insufficiency: Losartan was stopped and needs follow-up labs Blood pressure is not as low    Patient Instructions  Lantus 18 units in am  Check blood sugars on waking up 7 days a week  Also check blood sugars about 2 hours after meals and do this after different meals by rotation  Recommended blood sugar levels on waking up are 90-130 and about 2 hours after meal is 130-160  Please bring your blood sugar monitor to each visit, thank you        Elayne Snare 01/25/2020, 8:49 PM   Note: This office note was prepared with Dragon voice recognition system technology. Any transcriptional errors that result from this process are  unintentional.

## 2020-01-25 NOTE — Patient Instructions (Signed)
Lantus 18 units in am  Check blood sugars on waking up 7 days a week  Also check blood sugars about 2 hours after meals and do this after different meals by rotation  Recommended blood sugar levels on waking up are 90-130 and about 2 hours after meal is 130-160  Please bring your blood sugar monitor to each visit, thank you

## 2020-01-25 NOTE — ED Triage Notes (Signed)
Pt c/o cp since this morning not getting any relief with 3 baby ASA and 3 nitro sl that she took pta to ED. Pt denies any SOB, dizziness,  Nausea or vomiting.

## 2020-01-26 ENCOUNTER — Emergency Department (HOSPITAL_COMMUNITY): Payer: Medicare Other

## 2020-01-26 ENCOUNTER — Other Ambulatory Visit: Payer: Self-pay | Admitting: Gastroenterology

## 2020-01-26 ENCOUNTER — Observation Stay (HOSPITAL_COMMUNITY): Payer: Medicare Other

## 2020-01-26 DIAGNOSIS — Z79899 Other long term (current) drug therapy: Secondary | ICD-10-CM | POA: Diagnosis not present

## 2020-01-26 DIAGNOSIS — E039 Hypothyroidism, unspecified: Secondary | ICD-10-CM | POA: Diagnosis present

## 2020-01-26 DIAGNOSIS — Z8249 Family history of ischemic heart disease and other diseases of the circulatory system: Secondary | ICD-10-CM | POA: Diagnosis not present

## 2020-01-26 DIAGNOSIS — R079 Chest pain, unspecified: Secondary | ICD-10-CM | POA: Diagnosis present

## 2020-01-26 DIAGNOSIS — Z833 Family history of diabetes mellitus: Secondary | ICD-10-CM | POA: Diagnosis not present

## 2020-01-26 DIAGNOSIS — R072 Precordial pain: Secondary | ICD-10-CM | POA: Diagnosis present

## 2020-01-26 DIAGNOSIS — K219 Gastro-esophageal reflux disease without esophagitis: Secondary | ICD-10-CM | POA: Diagnosis present

## 2020-01-26 DIAGNOSIS — I200 Unstable angina: Secondary | ICD-10-CM | POA: Diagnosis not present

## 2020-01-26 DIAGNOSIS — Z20822 Contact with and (suspected) exposure to covid-19: Secondary | ICD-10-CM | POA: Diagnosis present

## 2020-01-26 DIAGNOSIS — R0602 Shortness of breath: Secondary | ICD-10-CM | POA: Diagnosis not present

## 2020-01-26 DIAGNOSIS — Z87891 Personal history of nicotine dependence: Secondary | ICD-10-CM | POA: Diagnosis not present

## 2020-01-26 DIAGNOSIS — Z6841 Body Mass Index (BMI) 40.0 and over, adult: Secondary | ICD-10-CM | POA: Diagnosis not present

## 2020-01-26 DIAGNOSIS — E119 Type 2 diabetes mellitus without complications: Secondary | ICD-10-CM

## 2020-01-26 DIAGNOSIS — Z981 Arthrodesis status: Secondary | ICD-10-CM | POA: Diagnosis not present

## 2020-01-26 DIAGNOSIS — M5412 Radiculopathy, cervical region: Secondary | ICD-10-CM | POA: Diagnosis present

## 2020-01-26 DIAGNOSIS — Z8719 Personal history of other diseases of the digestive system: Secondary | ICD-10-CM | POA: Diagnosis not present

## 2020-01-26 DIAGNOSIS — Z96652 Presence of left artificial knee joint: Secondary | ICD-10-CM | POA: Diagnosis present

## 2020-01-26 DIAGNOSIS — F419 Anxiety disorder, unspecified: Secondary | ICD-10-CM | POA: Diagnosis present

## 2020-01-26 DIAGNOSIS — K5900 Constipation, unspecified: Secondary | ICD-10-CM | POA: Diagnosis present

## 2020-01-26 DIAGNOSIS — N189 Chronic kidney disease, unspecified: Secondary | ICD-10-CM | POA: Diagnosis not present

## 2020-01-26 DIAGNOSIS — M1990 Unspecified osteoarthritis, unspecified site: Secondary | ICD-10-CM | POA: Diagnosis present

## 2020-01-26 DIAGNOSIS — I10 Essential (primary) hypertension: Secondary | ICD-10-CM | POA: Diagnosis not present

## 2020-01-26 DIAGNOSIS — I2510 Atherosclerotic heart disease of native coronary artery without angina pectoris: Secondary | ICD-10-CM | POA: Diagnosis present

## 2020-01-26 DIAGNOSIS — D631 Anemia in chronic kidney disease: Secondary | ICD-10-CM | POA: Diagnosis present

## 2020-01-26 DIAGNOSIS — E785 Hyperlipidemia, unspecified: Secondary | ICD-10-CM | POA: Diagnosis present

## 2020-01-26 DIAGNOSIS — G4700 Insomnia, unspecified: Secondary | ICD-10-CM | POA: Diagnosis present

## 2020-01-26 DIAGNOSIS — E1122 Type 2 diabetes mellitus with diabetic chronic kidney disease: Secondary | ICD-10-CM | POA: Diagnosis present

## 2020-01-26 DIAGNOSIS — F329 Major depressive disorder, single episode, unspecified: Secondary | ICD-10-CM | POA: Diagnosis present

## 2020-01-26 DIAGNOSIS — D649 Anemia, unspecified: Secondary | ICD-10-CM | POA: Diagnosis not present

## 2020-01-26 DIAGNOSIS — N1832 Chronic kidney disease, stage 3b: Secondary | ICD-10-CM | POA: Diagnosis present

## 2020-01-26 DIAGNOSIS — R0789 Other chest pain: Secondary | ICD-10-CM | POA: Diagnosis not present

## 2020-01-26 DIAGNOSIS — Z7982 Long term (current) use of aspirin: Secondary | ICD-10-CM | POA: Diagnosis not present

## 2020-01-26 LAB — TSH: TSH: 0.42 u[IU]/mL (ref 0.35–4.50)

## 2020-01-26 LAB — CBG MONITORING, ED: Glucose-Capillary: 165 mg/dL — ABNORMAL HIGH (ref 70–99)

## 2020-01-26 LAB — LDL CHOLESTEROL, DIRECT: Direct LDL: 87 mg/dL

## 2020-01-26 LAB — BASIC METABOLIC PANEL
BUN: 19 mg/dL (ref 6–23)
CO2: 26 mEq/L (ref 19–32)
Calcium: 9.2 mg/dL (ref 8.4–10.5)
Chloride: 105 mEq/L (ref 96–112)
Creatinine, Ser: 1.67 mg/dL — ABNORMAL HIGH (ref 0.40–1.20)
GFR: 36.71 mL/min — ABNORMAL LOW (ref >60.00)
Glucose, Bld: 152 mg/dL — ABNORMAL HIGH (ref 70–99)
Potassium: 4.6 mEq/L (ref 3.5–5.1)
Sodium: 137 mEq/L (ref 135–145)

## 2020-01-26 LAB — HEPARIN LEVEL (UNFRACTIONATED): Heparin Unfractionated: 0.77 IU/mL — ABNORMAL HIGH (ref 0.30–0.70)

## 2020-01-26 LAB — FRUCTOSAMINE: Fructosamine: 452 umol/L — ABNORMAL HIGH (ref 0–285)

## 2020-01-26 LAB — SARS CORONAVIRUS 2 (TAT 6-24 HRS): SARS Coronavirus 2: NEGATIVE

## 2020-01-26 LAB — GLUCOSE, CAPILLARY: Glucose-Capillary: 191 mg/dL — ABNORMAL HIGH (ref 70–99)

## 2020-01-26 MED ORDER — REGADENOSON 0.4 MG/5ML IV SOLN
INTRAVENOUS | Status: AC
  Filled 2020-01-26: qty 5

## 2020-01-26 MED ORDER — PANTOPRAZOLE SODIUM 40 MG PO TBEC
40.0000 mg | DELAYED_RELEASE_TABLET | Freq: Two times a day (BID) | ORAL | Status: DC
  Administered 2020-01-26 – 2020-01-27 (×3): 40 mg via ORAL
  Filled 2020-01-26 (×3): qty 1

## 2020-01-26 MED ORDER — ASPIRIN EC 81 MG PO TBEC
81.0000 mg | DELAYED_RELEASE_TABLET | Freq: Every evening | ORAL | Status: DC
  Administered 2020-01-26 – 2020-01-27 (×2): 81 mg via ORAL
  Filled 2020-01-26 (×2): qty 1

## 2020-01-26 MED ORDER — ATORVASTATIN CALCIUM 40 MG PO TABS
40.0000 mg | ORAL_TABLET | Freq: Every day | ORAL | Status: DC
  Administered 2020-01-26: 40 mg via ORAL
  Filled 2020-01-26: qty 1

## 2020-01-26 MED ORDER — CLONIDINE HCL 0.1 MG PO TABS
0.1000 mg | ORAL_TABLET | Freq: Two times a day (BID) | ORAL | Status: DC
  Administered 2020-01-26 – 2020-01-27 (×3): 0.1 mg via ORAL
  Filled 2020-01-26 (×3): qty 1

## 2020-01-26 MED ORDER — NITROGLYCERIN 2 % TD OINT
0.5000 [in_us] | TOPICAL_OINTMENT | Freq: Four times a day (QID) | TRANSDERMAL | Status: DC
  Administered 2020-01-26 – 2020-01-27 (×3): 0.5 [in_us] via TOPICAL
  Filled 2020-01-26: qty 1
  Filled 2020-01-26: qty 30

## 2020-01-26 MED ORDER — LOSARTAN POTASSIUM 50 MG PO TABS
100.0000 mg | ORAL_TABLET | Freq: Every day | ORAL | Status: DC
  Administered 2020-01-26 – 2020-01-27 (×2): 100 mg via ORAL
  Filled 2020-01-26 (×2): qty 2

## 2020-01-26 MED ORDER — REGADENOSON 0.4 MG/5ML IV SOLN
0.4000 mg | Freq: Once | INTRAVENOUS | Status: AC
  Administered 2020-01-26: 0.4 mg via INTRAVENOUS
  Filled 2020-01-26: qty 5

## 2020-01-26 MED ORDER — LEVOTHYROXINE SODIUM 75 MCG PO TABS
187.5000 ug | ORAL_TABLET | Freq: Every day | ORAL | Status: DC
  Administered 2020-01-27: 187.5 ug via ORAL
  Filled 2020-01-26: qty 3

## 2020-01-26 MED ORDER — NITROGLYCERIN 2 % TD OINT
1.0000 [in_us] | TOPICAL_OINTMENT | Freq: Once | TRANSDERMAL | Status: AC
  Administered 2020-01-26: 1 [in_us] via TOPICAL
  Filled 2020-01-26: qty 1

## 2020-01-26 MED ORDER — INSULIN GLARGINE 100 UNIT/ML ~~LOC~~ SOLN
20.0000 [IU] | Freq: Every day | SUBCUTANEOUS | Status: DC
  Administered 2020-01-26 – 2020-01-27 (×2): 20 [IU] via SUBCUTANEOUS
  Filled 2020-01-26 (×2): qty 0.2

## 2020-01-26 MED ORDER — GABAPENTIN 300 MG PO CAPS
300.0000 mg | ORAL_CAPSULE | Freq: Three times a day (TID) | ORAL | Status: DC
  Administered 2020-01-26 – 2020-01-27 (×5): 300 mg via ORAL
  Filled 2020-01-26 (×5): qty 1

## 2020-01-26 MED ORDER — BUPROPION HCL ER (XL) 150 MG PO TB24
150.0000 mg | ORAL_TABLET | Freq: Every day | ORAL | Status: DC
  Administered 2020-01-26 – 2020-01-27 (×2): 150 mg via ORAL
  Filled 2020-01-26 (×2): qty 1

## 2020-01-26 MED ORDER — ACETAMINOPHEN 325 MG PO TABS
650.0000 mg | ORAL_TABLET | Freq: Four times a day (QID) | ORAL | Status: DC | PRN
  Administered 2020-01-26: 650 mg via ORAL
  Filled 2020-01-26: qty 2

## 2020-01-26 MED ORDER — METOPROLOL SUCCINATE ER 50 MG PO TB24
50.0000 mg | ORAL_TABLET | Freq: Every day | ORAL | Status: DC
  Administered 2020-01-26 – 2020-01-27 (×2): 50 mg via ORAL
  Filled 2020-01-26: qty 1
  Filled 2020-01-26: qty 2

## 2020-01-26 MED ORDER — FLUOXETINE HCL 20 MG PO CAPS
20.0000 mg | ORAL_CAPSULE | Freq: Every day | ORAL | Status: DC
  Administered 2020-01-26 – 2020-01-27 (×2): 20 mg via ORAL
  Filled 2020-01-26 (×3): qty 1

## 2020-01-26 MED ORDER — HEPARIN (PORCINE) 25000 UT/250ML-% IV SOLN
1000.0000 [IU]/h | INTRAVENOUS | Status: DC
  Administered 2020-01-26: 1100 [IU]/h via INTRAVENOUS
  Administered 2020-01-27: 1000 [IU]/h via INTRAVENOUS
  Filled 2020-01-26 (×2): qty 250

## 2020-01-26 MED ORDER — NITROGLYCERIN 0.4 MG SL SUBL: 0.4000 mg | SUBLINGUAL_TABLET | SUBLINGUAL | Status: DC | PRN

## 2020-01-26 MED ORDER — HEPARIN BOLUS VIA INFUSION
4000.0000 [IU] | Freq: Once | INTRAVENOUS | Status: AC
  Administered 2020-01-26: 4000 [IU] via INTRAVENOUS
  Filled 2020-01-26: qty 4000

## 2020-01-26 NOTE — ED Notes (Signed)
[name], Son would like an update [phone]

## 2020-01-26 NOTE — Assessment & Plan Note (Signed)
-   check TSH - continue synthroid

## 2020-01-26 NOTE — ED Notes (Signed)
Call to dietary regarding meal order

## 2020-01-26 NOTE — ED Provider Notes (Signed)
[facility] EMERGENCY DEPARTMENT Provider Note   CSN: [id number] Arrival date & time: 01/25/20  1958     History Chief Complaint  Patient presents with  . Chest Pain    [name] is a 69 y.o. female.  Patient presents to the emergency department for evaluation of chest pain.  Patient reports that she woke up this morning with pain and has been waxing and waning throughout the day.  She has not had any nausea, vomiting, diaphoresis.  She does feel some mild shortness of breath and the pain feels like a heaviness on the left side of her chest.  She is starting to feel some radiation to the left arm.  Patient reports that she is some of her husband's nitroglycerin earlier in the pain significantly improved but then it started to come back.  She has taken aspirin prior to arrival.        Past Medical History:  Diagnosis Date  . Anxiety   . Arthritis   . Blood dyscrasia    "FREE BLEEDER"  . CERVICAL RADICULOPATHY, LEFT   . Chronic back pain   . CKD (chronic kidney disease)    DR. SANFORD  [facility]  . COMMON MIGRAINE   . CORONARY ARTERY DISEASE   . Cough    CURRENT COLD  . Cystitis   . DEPRESSION   . Diabetes mellitus, type II ([place])   . DIVERTICULOSIS-COLON   . Dysrhythmia    palpitations  . Gastric ulcer 04/2008  . Gastroparesis   . GERD (gastroesophageal reflux disease)   . Hiatal hernia   . Hyperlipidemia   . Hypertension   . Hypothyroidism   . INSOMNIA-SLEEP DISORDER-UNSPEC   . Iron deficiency anemia   . Wears glasses     Patient Active Problem List   Diagnosis Date Noted  . Chest pain 01/26/2020  . Rectal bleeding 09/01/2019  . History of peptic ulcer disease 09/01/2019  . Screening for viral disease 09/01/2019  . Insulin dependent type 2 diabetes mellitus ([place]) 09/01/2019  . Pyelonephritis 06/05/2019  . Recurrent UTI 06/05/2019  . Urinary incontinence 06/05/2019  . Nausea & vomiting 02/08/2019  . Abdominal pain 02/08/2019   . Constipation 02/08/2019  . UTI (urinary tract infection) 02/08/2019  . LUQ pain 01/21/2019  . Cervical radiculopathy at C6 01/28/2018  . Left lateral epicondylitis 01/23/2018  . Obesity 01/23/2018  . Weakness 05/01/2017  . Hypothyroidism 05/01/2017  . Hyperkalemia 05/01/2017  . Acute renal failure superimposed on stage 4 chronic kidney disease ([place]) 05/01/2017  . Hypotension 05/01/2017  . Sepsis ([place]) 05/01/2017  . Dysuria 04/23/2017  . Right leg swelling 12/27/2016  . Leg pain, lateral, right 11/05/2016  . Radiculopathy 04/03/2016  . Hypersomnolence 11/23/2015  . Right sided abdominal pain 07/25/2015  . Malaise and fatigue 11/05/2014  . Fracture of fifth metacarpal bone of right hand 07/08/2014  . Right cervical radiculopathy 06/27/2014  . Chronic interstitial cystitis 05/23/2014  . Osteoarthritis of [place] joint of thumb 04/11/2014  . De Quervain's tenosynovitis, left 03/14/2014  . CKD (chronic kidney disease) stage 4, GFR 15-29 ml/min ([facility]) 03/03/2014  . Lower back pain 03/03/2014  . Ingrown nail 03/03/2014  . Peripheral edema 02/23/2014  . Lateral epicondylitis of right elbow 01/31/2014  . Neck pain on left side 11/19/2013  . Abdominal discomfort 10/12/2012  . Right lumbar radiculopathy 05/11/2012  . Peripheral neuropathy 05/11/2012  . Vertigo 03/29/2012  . Headache(784.0) 03/10/2012  . Lumbar radiculopathy 10/24/2011  . Other postablative  hypothyroidism 08/19/2011  . Right leg weakness 04/04/2011  . Hematochezia 01/07/2011  . Preventative health care 01/07/2011  . CHOLELITHIASIS 05/04/2009  . GERD 04/26/2009  . Gastroparesis 04/26/2009  . ULCER-GASTRIC 03/15/2009  . DIVERTICULOSIS-COLON 03/15/2009  . Cervical radiculopathy 10/28/2008  . MENOPAUSAL DISORDER 06/29/2008  . Pain in joint, shoulder region 10/12/2007  . INSOMNIA-SLEEP DISORDER-UNSPEC 09/01/2007  . Depression 09/01/2007  . ANEMIA-NOS 08/13/2007  . COMMON MIGRAINE 08/13/2007  . Hypertonicity of  bladder 08/13/2007  . Diabetes ([place]) 05/06/2007  . HLD (hyperlipidemia) 05/06/2007  . ANXIETY 05/06/2007  . Essential hypertension 05/06/2007  . Coronary atherosclerosis 05/06/2007    Past Surgical History:  Procedure Laterality Date  . ABDOMINAL HYSTERECTOMY    . ANTERIOR LAT LUMBAR FUSION Left 08/13/2017   Procedure: LEFT SIDED LUMBAR 3-4 LATERAL INTERBODY FUSION WITH INSTRUMENTATION AND ALLOGRAFT;  Surgeon: Phylliss Bob, MD;  Location: [facility];  Service: Orthopedics;  Laterality: Left;  LEFT SIDED LUMBAR 3-4 LATERAL INTERBODY FUSION WITH INSTRUMENTATION AND ALLOGRAFT; REQUEST 3 HOURS  . Back Stimulator  07/2018  . BACK SURGERY  03/2016  . BREAST EXCISIONAL BIOPSY Right    40 years ago  . CHOLECYSTECTOMY  06/2009  . COLONOSCOPY    . ESOPHAGOGASTRODUODENOSCOPY    . EYE SURGERY Bilateral    lasik  . Gastric Wedge resection lipoma  11/2007   x2 with laparotomy and gastrostomy  . JOINT REPLACEMENT    . Left knee replacement    . Rigth knee replacement with revision  04/2008   Dr. Berenice Primas  . ROTATOR CUFF REPAIR Left 01/2009  . s/p bladder surgury  09/2009   Dr. Terance Hart  . SPINAL CORD STIMULATOR INSERTION N/A 09/11/2018   Procedure: LUMBAR SPINAL CORD STIMULATOR INSERTION;  Surgeon: Clydell Hakim, MD;  Location: [facility];  Service: Neurosurgery;  Laterality: N/A;  LUMBAR SPINAL CORD STIMULATOR INSERTION     OB History    Gravida  3   Para  3   Term      Preterm      AB      Living  3     SAB      TAB      Ectopic      Multiple      Live Births              Family History  Problem Relation Age of Onset  . Diabetes Sister        x 3  . Heart disease Sister        x2  . Breast cancer Sister 40  . Diabetes Brother        x3  . Heart disease Brother        x2  . Coronary artery disease Other        female 1st degree  . Hypertension Other   . Breast cancer Sister 34  . Breast cancer Other 25  . Diabetes Brother   . Colon cancer Neg Hx     Social History     Tobacco Use  . Smoking status: Former Research scientist (life sciences)  . Smokeless tobacco: Never Used  . Tobacco comment: quit 30 years ago  Substance Use Topics  . Alcohol use: No    Alcohol/week: 0.0 standard drinks  . Drug use: No    Home Medications Prior to Admission medications   Medication Sig Start Date End Date Taking? Authorizing Provider  aspirin 81 MG EC tablet Take 81 mg by mouth every evening.    Yes [provider]  atorvastatin (LIPITOR) 40 MG tablet TAKE ONE TABLET BY MOUTH EVERY NIGHT AT BEDTIME Patient taking differently: Take 40 mg by mouth at bedtime.  12/14/19  Yes Biagio Borg, MD  cloNIDine (CATAPRES) 0.1 MG tablet TAKE ONE TABLET BY MOUTH TWICE A DAY Patient taking differently: Take 0.1 mg by mouth 2 (two) times daily.  01/12/20  Yes Elayne Snare, MD  ELMIRON 100 MG capsule Take 200 mg by mouth 2 (two) times daily.  07/20/13  Yes [provider]  FLUoxetine (PROZAC) 20 MG capsule TAKE ONE CAPSULE BY MOUTH DAILY Patient taking differently: Take 20 mg by mouth daily.  12/14/19  Yes Biagio Borg, MD  gabapentin (NEURONTIN) 300 MG capsule Take 300 mg by mouth 3 (three) times daily.  07/12/19  Yes [provider]  insulin glargine (LANTUS SOLOSTAR) 100 UNIT/ML Solostar Pen Inject 20 Units into the skin daily. 01/20/20  Yes Elayne Snare, MD  Insulin Lispro Prot & Lispro (HUMALOG MIX 75/25 KWIKPEN) (75-25) 100 UNIT/ML Kwikpen Inject 20 Units into the skin daily with breakfast. Patient taking differently: Inject 10 Units into the skin in the morning, at noon, and at bedtime.  10/20/19  Yes Elayne Snare, MD  levothyroxine (SYNTHROID) 125 MCG tablet Take 1 tablet by mouth daily, Sunday-Friday. On Saturday, take 1.5 tablets. Patient taking differently: Take 187.5 mcg by mouth daily before breakfast.  11/22/19  Yes Elayne Snare, MD  linaclotide [facility]) 72 MCG capsule Take 1 capsule (72 mcg total) by mouth daily before breakfast. 01/25/19  Yes Ladene Artist, MD  losartan  (COZAAR) 100 MG tablet Take one tab daily Patient taking differently: Take 100 mg by mouth daily.  11/24/19  Yes Biagio Borg, MD  metoprolol succinate (TOPROL-XL) 50 MG 24 hr tablet TAKE 1 TABLET BY MOUTH DAILY WITH OR IMMEDIATELY FOLLOWING A MEAL Patient taking differently: Take 50 mg by mouth daily.  08/12/19  Yes Elayne Snare, MD  naltrexone (DEPADE) 50 MG tablet Take 1 tablet (50 mg total) by mouth daily. 06/01/19  Yes Biagio Borg, MD  ondansetron (ZOFRAN) 4 MG tablet Take 1 tablet (4 mg total) by mouth every 8 (eight) hours as needed for nausea or vomiting. 12/30/19  Yes Biagio Borg, MD  pantoprazole (PROTONIX) 40 MG tablet TAKE 1 TABLET(40 MG) BY MOUTH TWICE DAILY Patient taking differently: Take 40 mg by mouth 2 (two) times daily.  10/03/19  Yes Zehr, Laban Emperor, PA-C  traMADol (ULTRAM) 50 MG tablet Take 1 tablet (50 mg total) by mouth every 6 (six) hours as needed. 05/02/19  Yes Scot Jun, FNP  buPROPion (WELLBUTRIN XL) 150 MG 24 hr tablet Take 1 tablet (150 mg total) by mouth daily. Patient not taking: Reported on 01/25/2020 01/21/19   Biagio Borg, MD  Continuous Blood Gluc Receiver (FREESTYLE LIBRE 14 DAY READER) DEVI by Does not apply route.    [provider]  glucose blood (FREESTYLE TEST STRIPS) test strip Use as instructed to check blood sugar 4 times daily. 04/20/18   Elayne Snare, MD  Na Sulfate-K Sulfate-Mg Sulf (SUPREP BOWEL PREP KIT) 17.5-3.13-1.6 GM/177ML SOLN Take 1 kit by mouth as directed. For colonoscopy prep Patient not taking: Reported on 01/26/2020 09/01/19   Zehr, Laban Emperor, PA-C  promethazine (PHENERGAN) 25 MG tablet Take 1 tablet (25 mg total) by mouth every 8 (eight) hours as needed for nausea or vomiting. Patient not taking: Reported on 01/26/2020 03/23/19   Biagio Borg, MD  Allergies    Ciprofloxacin, Hydrocodone, and Penicillins  Review of Systems   Review of Systems  Cardiovascular: Positive for chest pain.  All other systems reviewed and  are negative.   Physical Exam Updated Vital Signs BP (!) 185/95   Pulse 80   Temp 98 F (36.7 C) (Oral)   Resp 11   Ht _0  (1.676 m)   Wt 132 kg   SpO2 100%   BMI 46.97 kg/m   Physical Exam Vitals and nursing note reviewed.  Constitutional:      General: She is not in acute distress.    Appearance: Normal appearance. She is well-developed.  HENT:     Head: Normocephalic and atraumatic.     Right Ear: Hearing normal.     Left Ear: Hearing normal.     Nose: Nose normal.  Eyes:     Conjunctiva/sclera: Conjunctivae normal.     Pupils: Pupils are equal, round, and reactive to light.  Cardiovascular:     Rate and Rhythm: Regular rhythm.     Heart sounds: S1 normal and S2 normal. No murmur. No friction rub. No gallop.   Pulmonary:     Effort: Pulmonary effort is normal. No respiratory distress.     Breath sounds: Normal breath sounds.  Chest:     Chest wall: No tenderness.  Abdominal:     General: Bowel sounds are normal.     Palpations: Abdomen is soft.     Tenderness: There is no abdominal tenderness. There is no guarding or rebound. Negative signs include Murphy's sign and McBurney's sign.     Hernia: No hernia is present.  Musculoskeletal:        General: Normal range of motion.     Cervical back: Normal range of motion and neck supple.  Skin:    General: Skin is warm and dry.     Findings: No rash.  Neurological:     Mental Status: She is alert and oriented to person, place, and time.     GCS: GCS eye subscore is 4. GCS verbal subscore is 5. GCS motor subscore is 6.     Cranial Nerves: No cranial nerve deficit.     Sensory: No sensory deficit.     Coordination: Coordination normal.  Psychiatric:        Speech: Speech normal.        Behavior: Behavior normal.        Thought Content: Thought content normal.     ED Results / Procedures / Treatments   Labs (all labs ordered are listed, but only abnormal results are displayed) Labs Reviewed  BASIC METABOLIC  PANEL - Abnormal; Notable for the following components:      Result Value   Glucose, Bld 177 (*)    Creatinine, Ser 1.78 (*)    GFR calc non Af Amer 29 (*)    GFR calc Af Amer 33 (*)    All other components within normal limits  CBC - Abnormal; Notable for the following components:   RBC 2.81 (*)    Hemoglobin 9.0 (*)    HCT 30.0 (*)    MCV 106.8 (*)    All other components within normal limits  SARS CORONAVIRUS 2 (TAT 6-24 HRS)  HEPARIN LEVEL (UNFRACTIONATED)  TSH  LIPID PANEL  HEMOGLOBIN A1C  TROPONIN I (HIGH SENSITIVITY)  TROPONIN I (HIGH SENSITIVITY)    EKG EKG Interpretation  Date/Time:  Tuesday January 25 2020 20:11:20 EDT Ventricular Rate:  65 PR Interval:  156 QRS Duration: 82 QT Interval:  418 QTC Calculation: 434 R Axis:   9 Text Interpretation: Normal sinus rhythm Cannot rule out Anterior infarct , age undetermined Abnormal ECG No significant change since last tracing Confirmed by Orpah Greek [id number]) on 01/26/2020 2:15:31 AM   Radiology DG Chest Port 1 View  Result Date: 01/26/2020 CLINICAL DATA:  Chest pain EXAM: PORTABLE CHEST 1 VIEW COMPARISON:  08/07/2017 FINDINGS: Cardiomegaly. No confluent airspace opacities, effusions or edema. No acute bony abnormality. Spinal stimulator wires in the midthoracic spine region. IMPRESSION: Cardiomegaly.  No active disease. Electronically Signed   By: Rolm Baptise M.D.   On: 01/26/2020 03:49    Procedures Procedures (including critical care time)  Medications Ordered in ED Medications  heparin ADULT infusion 100 units/mL (25000 units/2109m sodium chloride 0.45%) (1,100 Units/hr Intravenous New Bag/Given 01/26/20 0357)  buPROPion (WELLBUTRIN XL) 24 hr tablet 150 mg (has no administration in time range)  aspirin EC tablet 81 mg (has no administration in time range)  atorvastatin (LIPITOR) tablet 40 mg (has no administration in time range)  cloNIDine (CATAPRES) tablet 0.1 mg (has no administration in time range)    FLUoxetine (PROZAC) capsule 20 mg (has no administration in time range)  gabapentin (NEURONTIN) capsule 300 mg (has no administration in time range)  insulin glargine (LANTUS) Solostar Pen 20 Units (has no administration in time range)  levothyroxine (SYNTHROID) tablet 187.5 mcg (has no administration in time range)  losartan (COZAAR) tablet 100 mg (has no administration in time range)  metoprolol succinate (TOPROL-XL) 24 hr tablet 50 mg (has no administration in time range)  pantoprazole (PROTONIX) EC tablet 40 mg (has no administration in time range)  nitroGLYCERIN (NITROSTAT) SL tablet 0.4 mg (has no administration in time range)  sodium chloride flush (NS) 0.9 % injection 3 mL (3 mLs Intravenous Given 01/26/20 0559)  nitroGLYCERIN (NITROGLYN) 2 % ointment 1 inch (1 inch Topical Given 01/26/20 0353)  heparin bolus via infusion 4,000 Units (4,000 Units Intravenous Bolus from Bag 01/26/20 0357)    ED Course  I have reviewed the triage vital signs and the nursing notes.  Pertinent labs & imaging results that were available during my care of the patient were reviewed by me and considered in my medical decision making (see chart for details).    MDM Rules/Calculators/A&P                      Patient presents to the emergency department for evaluation of chest pain.  Patient does not have any known coronary artery disease but does have multiple cardiac risk factors including obesity, hypertension, high cholesterol, diabetes.  She took her husband's nitroglycerin earlier today and it did improve the pain.  EKG shows poor R wave progression in anterior leads but no ST segment changes, no T wave changes.  She has had 2 - troponins.  Reviewing her records reveals a nuclear medicine stress test in 2018 that showed normal ejection fraction and no reversible ischemia.  She sees Dr. HTerrence Dupont  I discussed her presentation with Dr. HTerrence Dupont he recommends initiating heparin.  He asked for medicine  admission and he will consult on the patient in the morning.  CRITICAL CARE Performed by: COrpah Greek  Total critical care time: 35 minutes  Critical care time was exclusive of separately billable procedures and treating other patients.  Critical care was necessary to treat or prevent imminent or life-threatening deterioration.  Critical care was time spent personally by  me on the following activities: development of treatment plan with patient and/or surrogate as well as nursing, discussions with consultants, evaluation of patient's response to treatment, examination of patient, obtaining history from patient or surrogate, ordering and performing treatments and interventions, ordering and review of laboratory studies, ordering and review of radiographic studies, pulse oximetry and re-evaluation of patient's condition.   Final Clinical Impression(s) / ED Diagnoses Final diagnoses:  Chest pain, unspecified type    Rx / DC Orders ED Discharge Orders    None       Orpah Greek, MD 01/26/20 [phone]

## 2020-01-26 NOTE — ED Notes (Signed)
Pt had two nitro patches (prescribed to her husband) on her abdomen and chest. Pt reports taking two SL nitro when CP started and ASA. Denies chest pain at present, c/o headache. Nitro patches removed. Nitro paste placed to L chest per [facility].

## 2020-01-26 NOTE — ED Notes (Signed)
Pt was placed on purewick after being placed on hospital bed.  Pt is being taken to nuc med for stress test

## 2020-01-26 NOTE — Progress Notes (Signed)
ANTICOAGULATION CONSULT NOTE - Initial Consult  Pharmacy Consult for heparin Indication: chest pain/ACS  Allergies  Allergen Reactions  . Ciprofloxacin Shortness Of Breath and Other (See Comments)    Dizziness   . Hydrocodone Shortness Of Breath and Other (See Comments)    Tachycardia  . Penicillins Hives    Pt just remembers hives Has patient had a PCN reaction causing immediate rash, facial/tongue/throat swelling, SOB or lightheadedness with hypotension: No Has patient had a PCN reaction causing severe rash involving mucus membranes or skin necrosis: No Has patient had a PCN reaction that required hospitalization No Has patient had a PCN reaction occurring within the last 10 years: No If all of the above answers are "NO", then may proceed with Cephalosporin use.     Patient Measurements: Height: 5\' 6"  (167.6 cm) Weight: 132 kg (291 lb) IBW/kg (Calculated) : 59.3 Heparin Dosing Weight: 91.5 kg  Vital Signs: BP: 177/102 (04/21 1500) Pulse Rate: 70 (04/21 1500)  Labs: Recent Labs    01/25/20 1558 01/25/20 2017 01/25/20 2226 01/26/20 1508  HGB  --  9.0*  --   --   HCT  --  30.0*  --   --   PLT  --  260  --   --   HEPARINUNFRC  --   --   --  0.77*  CREATININE 1.67* 1.78*  --   --   TROPONINIHS  --  5 5  --     Estimated Creatinine Clearance: 41.6 mL/min (A) (by C-G formula based on SCr of 1.78 mg/dL (H)).   Medical History: Past Medical History:  Diagnosis Date  . Anxiety   . Arthritis   . Blood dyscrasia    "FREE BLEEDER"  . CERVICAL RADICULOPATHY, LEFT   . Chronic back pain   . CKD (chronic kidney disease)    DR. SANFORD  [facility]  . COMMON MIGRAINE   . CORONARY ARTERY DISEASE   . Cough    CURRENT COLD  . Cystitis   . DEPRESSION   . Diabetes mellitus, type II ([place])   . DIVERTICULOSIS-COLON   . Dysrhythmia    palpitations  . Gastric ulcer 04/2008  . Gastroparesis   . GERD (gastroesophageal reflux disease)   . Hiatal hernia   .  Hyperlipidemia   . Hypertension   . Hypothyroidism   . INSOMNIA-SLEEP DISORDER-UNSPEC   . Iron deficiency anemia   . Wears glasses     Medications:  See medications  Assessment: 61 yof to start heparin for CP.  She was not on anticoagulation PTA.  Hg 9.0, PTLC 260 Goal of Therapy:  Heparin level 0.3-0.7 units/ml Monitor platelets by anticoagulation protocol: Yes   Plan:  - Heparin level slightly supra-therapeutic at 0.77  - Will decrease Heparin drip to 1000 units/hr  - Obtain Heparin level in ~ 6 hours  - Monitor for s/s of bleeding and cbc  Thanks for allowing pharmacy to be a part of this patient's care.  Duanne Limerick PharmD. BCPS  ED Clinical Pharmacist 01/26/2020,3:51 PM

## 2020-01-26 NOTE — Assessment & Plan Note (Signed)
-   continue home regimen  

## 2020-01-26 NOTE — ED Notes (Signed)
Report attempted 

## 2020-01-26 NOTE — H&P (Signed)
History and Physical    [name] TDV:[id number] DOB: [birth year] DOA: 01/25/2020  PCP: Biagio Borg, MD  Patient coming from: home  I have personally briefly reviewed patient's old medical records in [place]  Chief Complaint: chest pain  HPI: [name] is a 69 yo female with PMH DMII, IDA, hypothyroidism, hypertension, hyperlipidemia, GERD, diverticulosis, depression, CKDIII, CAD (follows outpt with Dr. Terrence Dupont) who presented with chest pain at home.  History is provided by the patient while she is in the ER.  She states that on Monday around 6 AM she was awoken from sleep with a dull heavy sensation on the middle of her chest (pointing at her lower sternum).  She states that the pain was constant in nature and she felt like it did radiate a little bit to her left arm down to her elbow and a throbbing sensation.  The pain essentially resolved spontaneously after approximately 5 hours when she was resting on the couch.  She called her cardiologist at onset and she was told to take nitroglycerin and 3 baby aspirin.  On Tuesday around 4 PM the pain again recurred as she was preparing dinner.  She also had an associated "cold sweat".  At this time she states that she then started to sit down and rest once again but due to the pain not resolving or improving, she presented to the ER for further evaluation.  She denies any nausea or vomiting associated with her episode and also denies any shortness of breath. She has had no large changes in her medications recently except for multiple adjustments with her insulin regimen which has been coming down in insulin requirement.  While in the ER, her case was discussed with cardiology and given her symptoms and history, she was recommended to start on a heparin drip and she would be evaluated in the morning by cardiology.  Initial troponin was negative which was trended and remained negative x2. EKG also obtained in the ER did not show any ST or T  wave changes.   She was admitted for further cardiac workup   Review of Systems: As per HPI otherwise 10 point review of systems negative.   Past Medical History:  Diagnosis Date  . Anxiety   . Arthritis   . Blood dyscrasia    "FREE BLEEDER"  . CERVICAL RADICULOPATHY, LEFT   . Chronic back pain   . CKD (chronic kidney disease)    DR. SANFORD  [facility]  . COMMON MIGRAINE   . CORONARY ARTERY DISEASE   . Cough    CURRENT COLD  . Cystitis   . DEPRESSION   . Diabetes mellitus, type II ([place])   . DIVERTICULOSIS-COLON   . Dysrhythmia    palpitations  . Gastric ulcer 04/2008  . Gastroparesis   . GERD (gastroesophageal reflux disease)   . Hiatal hernia   . Hyperlipidemia   . Hypertension   . Hypothyroidism   . INSOMNIA-SLEEP DISORDER-UNSPEC   . Iron deficiency anemia   . Wears glasses     Past Surgical History:  Procedure Laterality Date  . ABDOMINAL HYSTERECTOMY    . ANTERIOR LAT LUMBAR FUSION Left 08/13/2017   Procedure: LEFT SIDED LUMBAR 3-4 LATERAL INTERBODY FUSION WITH INSTRUMENTATION AND ALLOGRAFT;  Surgeon: Phylliss Bob, MD;  Location: [facility];  Service: Orthopedics;  Laterality: Left;  LEFT SIDED LUMBAR 3-4 LATERAL INTERBODY FUSION WITH INSTRUMENTATION AND ALLOGRAFT; REQUEST 3 HOURS  . Back Stimulator  07/2018  . BACK SURGERY  03/2016  . BREAST EXCISIONAL BIOPSY Right    40 years ago  . CHOLECYSTECTOMY  06/2009  . COLONOSCOPY    . ESOPHAGOGASTRODUODENOSCOPY    . EYE SURGERY Bilateral    lasik  . Gastric Wedge resection lipoma  11/2007   x2 with laparotomy and gastrostomy  . JOINT REPLACEMENT    . Left knee replacement    . Rigth knee replacement with revision  04/2008   Dr. Berenice Primas  . ROTATOR CUFF REPAIR Left 01/2009  . s/p bladder surgury  09/2009   Dr. Terance Hart  . SPINAL CORD STIMULATOR INSERTION N/A 09/11/2018   Procedure: LUMBAR SPINAL CORD STIMULATOR INSERTION;  Surgeon: Clydell Hakim, MD;  Location: [facility];  Service: Neurosurgery;  Laterality: N/A;   LUMBAR SPINAL CORD STIMULATOR INSERTION     reports that she has quit smoking. She has never used smokeless tobacco. She reports that she does not drink alcohol or use drugs.  Allergies  Allergen Reactions  . Ciprofloxacin Shortness Of Breath and Other (See Comments)    Dizziness   . Hydrocodone Shortness Of Breath and Other (See Comments)    Tachycardia  . Penicillins Hives    Pt just remembers hives Has patient had a PCN reaction causing immediate rash, facial/tongue/throat swelling, SOB or lightheadedness with hypotension: No Has patient had a PCN reaction causing severe rash involving mucus membranes or skin necrosis: No Has patient had a PCN reaction that required hospitalization No Has patient had a PCN reaction occurring within the last 10 years: No If all of the above answers are "NO", then may proceed with Cephalosporin use.     Family History  Problem Relation Age of Onset  . Diabetes Sister        x 3  . Heart disease Sister        x2  . Breast cancer Sister 55  . Diabetes Brother        x3  . Heart disease Brother        x2  . Coronary artery disease Other        female 1st degree  . Hypertension Other   . Breast cancer Sister 25  . Breast cancer Other 25  . Diabetes Brother   . Colon cancer Neg Hx     Prior to Admission medications   Medication Sig Start Date End Date Taking? Authorizing Provider  aspirin 81 MG EC tablet Take 81 mg by mouth every evening.    Yes [provider]  atorvastatin (LIPITOR) 40 MG tablet TAKE ONE TABLET BY MOUTH EVERY NIGHT AT BEDTIME Patient taking differently: Take 40 mg by mouth at bedtime.  12/14/19  Yes Biagio Borg, MD  cloNIDine (CATAPRES) 0.1 MG tablet TAKE ONE TABLET BY MOUTH TWICE A DAY Patient taking differently: Take 0.1 mg by mouth 2 (two) times daily.  01/12/20  Yes Elayne Snare, MD  ELMIRON 100 MG capsule Take 200 mg by mouth 2 (two) times daily.  07/20/13  Yes [provider]  FLUoxetine (PROZAC) 20  MG capsule TAKE ONE CAPSULE BY MOUTH DAILY Patient taking differently: Take 20 mg by mouth daily.  12/14/19  Yes Biagio Borg, MD  gabapentin (NEURONTIN) 300 MG capsule Take 300 mg by mouth 3 (three) times daily.  07/12/19  Yes [provider]  insulin glargine (LANTUS SOLOSTAR) 100 UNIT/ML Solostar Pen Inject 20 Units into the skin daily. 01/20/20  Yes Elayne Snare, MD  Insulin Lispro Prot & Lispro (HUMALOG MIX 75/25 KWIKPEN) (75-25)  100 UNIT/ML Kwikpen Inject 20 Units into the skin daily with breakfast. Patient taking differently: Inject 10 Units into the skin in the morning, at noon, and at bedtime.  10/20/19  Yes Elayne Snare, MD  levothyroxine (SYNTHROID) 125 MCG tablet Take 1 tablet by mouth daily, Sunday-Friday. On Saturday, take 1.5 tablets. Patient taking differently: Take 187.5 mcg by mouth daily before breakfast.  11/22/19  Yes Elayne Snare, MD  linaclotide [facility]) 72 MCG capsule Take 1 capsule (72 mcg total) by mouth daily before breakfast. 01/25/19  Yes Ladene Artist, MD  losartan (COZAAR) 100 MG tablet Take one tab daily Patient taking differently: Take 100 mg by mouth daily.  11/24/19  Yes Biagio Borg, MD  metoprolol succinate (TOPROL-XL) 50 MG 24 hr tablet TAKE 1 TABLET BY MOUTH DAILY WITH OR IMMEDIATELY FOLLOWING A MEAL Patient taking differently: Take 50 mg by mouth daily.  08/12/19  Yes Elayne Snare, MD  naltrexone (DEPADE) 50 MG tablet Take 1 tablet (50 mg total) by mouth daily. 06/01/19  Yes Biagio Borg, MD  ondansetron (ZOFRAN) 4 MG tablet Take 1 tablet (4 mg total) by mouth every 8 (eight) hours as needed for nausea or vomiting. 12/30/19  Yes Biagio Borg, MD  pantoprazole (PROTONIX) 40 MG tablet TAKE 1 TABLET(40 MG) BY MOUTH TWICE DAILY Patient taking differently: Take 40 mg by mouth 2 (two) times daily.  10/03/19  Yes Zehr, Laban Emperor, PA-C  traMADol (ULTRAM) 50 MG tablet Take 1 tablet (50 mg total) by mouth every 6 (six) hours as needed. 05/02/19  Yes Scot Jun,  FNP  buPROPion (WELLBUTRIN XL) 150 MG 24 hr tablet Take 1 tablet (150 mg total) by mouth daily. Patient not taking: Reported on 01/25/2020 01/21/19   Biagio Borg, MD  Continuous Blood Gluc Receiver (FREESTYLE LIBRE 14 DAY READER) DEVI by Does not apply route.    [provider]  glucose blood (FREESTYLE TEST STRIPS) test strip Use as instructed to check blood sugar 4 times daily. 04/20/18   Elayne Snare, MD  Na Sulfate-K Sulfate-Mg Sulf (SUPREP BOWEL PREP KIT) 17.5-3.13-1.6 GM/177ML SOLN Take 1 kit by mouth as directed. For colonoscopy prep Patient not taking: Reported on 01/26/2020 09/01/19   Zehr, Laban Emperor, PA-C  promethazine (PHENERGAN) 25 MG tablet Take 1 tablet (25 mg total) by mouth every 8 (eight) hours as needed for nausea or vomiting. Patient not taking: Reported on 01/26/2020 03/23/19   Biagio Borg, MD    Physical Exam: Vitals:   01/26/20 0145 01/26/20 0326 01/26/20 0552 01/26/20 0553  BP: (!) 179/85 (!) 174/80 (!) 203/89   Pulse: 69 82  79  Resp: 18 17 (!) 24 20  Temp:      TempSrc:      SpO2: 100% 100%  100%  Weight:      Height:        General appearance: Pleasant adult woman lying in bed in no distress Head: Normocephalic, without obvious abnormality Eyes: EOMI Lungs: clear to auscultation bilaterally Heart: regular rate and rhythm and S1, S2 normal Abdomen: normal findings: bowel sounds normal and soft, non-tender Extremities: No edema Skin: mobility and turgor normal Neurologic: Grossly normal  Labs on Admission: I have personally reviewed following labs and imaging studies  CBC: Recent Labs  Lab 01/25/20 2017  WBC 7.6  HGB 9.0*  HCT 30.0*  MCV 106.8*  PLT 725   Basic Metabolic Panel: Recent Labs  Lab 01/25/20 2017  NA 137  K 4.7  CL 106  CO2 23  GLUCOSE 177*  BUN 19  CREATININE 1.78*  CALCIUM 9.5   GFR: Estimated Creatinine Clearance: 41.6 mL/min (A) (by C-G formula based on SCr of 1.78 mg/dL (H)). Liver Function Tests: No results  for input(s): AST, ALT, ALKPHOS, BILITOT, PROT, ALBUMIN in the last 168 hours. No results for input(s): LIPASE, AMYLASE in the last 168 hours. No results for input(s): AMMONIA in the last 168 hours. Coagulation Profile: No results for input(s): INR, PROTIME in the last 168 hours. Cardiac Enzymes: No results for input(s): CKTOTAL, CKMB, CKMBINDEX, TROPONINI in the last 168 hours. BNP (last 3 results) No results for input(s): PROBNP in the last 8760 hours. HbA1C: No results for input(s): HGBA1C in the last 72 hours. CBG: No results for input(s): GLUCAP in the last 168 hours. Lipid Profile: No results for input(s): CHOL, HDL, LDLCALC, TRIG, CHOLHDL, LDLDIRECT in the last 72 hours. Thyroid Function Tests: No results for input(s): TSH, T4TOTAL, FREET4, T3FREE, THYROIDAB in the last 72 hours. Anemia Panel: No results for input(s): VITAMINB12, FOLATE, FERRITIN, TIBC, IRON, RETICCTPCT in the last 72 hours. Urine analysis:    Component Value Date/Time   COLORURINE YELLOW 12/12/2019 1029   APPEARANCEUR CLEAR 12/12/2019 1029   LABSPEC 1.018 12/12/2019 1029   PHURINE 6.0 12/12/2019 1029   GLUCOSEU >=500 (A) 12/12/2019 1029   GLUCOSEU 100 (A) 08/23/2019 1050   HGBUR NEGATIVE 12/12/2019 1029   BILIRUBINUR NEGATIVE 12/12/2019 1029   BILIRUBINUR negative 04/11/2018 1308   KETONESUR NEGATIVE 12/12/2019 1029   PROTEINUR NEGATIVE 12/12/2019 1029   UROBILINOGEN >=8.0 (A) 08/23/2019 1050   NITRITE NEGATIVE 12/12/2019 1029   LEUKOCYTESUR NEGATIVE 12/12/2019 1029    Radiological Exams on Admission: DG Chest Port 1 View  Result Date: 01/26/2020 CLINICAL DATA:  Chest pain EXAM: PORTABLE CHEST 1 VIEW COMPARISON:  08/07/2017 FINDINGS: Cardiomegaly. No confluent airspace opacities, effusions or edema. No acute bony abnormality. Spinal stimulator wires in the midthoracic spine region. IMPRESSION: Cardiomegaly.  No active disease. Electronically Signed   By: Rolm Baptise M.D.   On: 01/26/2020 03:49     EKG: Independently reviewed.  NSR.  No obvious ischemia  Assessment/Plan  Chest pain -- substernal in nature but non-exertional and appears to resolve on its worn; likely is noncardiac CP in nature - HEART score = 4 (age and risk factors) -Continue on heparin drip (recommended per cardioloy) Continue aspirin -Check TSH, lipid, A1c  Type 2 diabetes -Continue Lantus and SSI -CBG monitoring  Hyperlipidemia -Continue statin -Check lipid panel  Hypertension -Continue home regimen  Hypothyroidism -Check TSH -Continue Synthroid  DVT prophylaxis: heparin drip Code Status: Full code Family Communication: none Disposition Plan: home Consults called: cardiology Admission status: obs   Dwyane Dee, MD [facility] Pager [phone]  If 7PM-7AM, please contact night-coverage [email or web address] Use universal [place] password for that web site. If you do not have the password, please call the hospital operator.  01/26/2020, 6:06 AM

## 2020-01-26 NOTE — Hospital Course (Signed)
[name] is a 69 yo female with PMH DMII, IDA, hypothyroidism, hypertension, hyperlipidemia, GERD, diverticulosis, depression, CKDIII, CAD (follows outpt with Dr. Terrence Dupont) who presented with chest pain at home.  History is provided by the patient while she is in the ER.  She states that on Monday around 6 AM she was awoken from sleep with a dull heavy sensation on the middle of her chest (pointing at her lower sternum).  She states that the pain was constant in nature and she felt like it did radiate a little bit to her left arm down to her elbow and a throbbing sensation.  The pain essentially resolved spontaneously after approximately 5 hours when she was resting on the couch.  She called her cardiologist at onset and she was told to take nitroglycerin and 3 baby aspirin.  On Tuesday around 4 PM the pain again recurred as she was preparing dinner.  She also had an associated "cold sweat".  At this time she states that she then started to sit down and rest once again but due to the pain not resolving or improving, she presented to the ER for further evaluation.  She denies any nausea or vomiting associated with her episode and also denies any shortness of breath. She has had no large changes in her medications recently except for multiple adjustments with her insulin regimen which has been coming down in insulin requirement.  While in the ER, her case was discussed with cardiology and given her symptoms and history, she was recommended to start on a heparin drip and she would be evaluated in the morning by cardiology.  Initial troponin was negative which was trended and remained negative x2. EKG also obtained in the ER did not show any ST or T wave changes.   She was admitted for further cardiac workup

## 2020-01-26 NOTE — Assessment & Plan Note (Signed)
-   check A1c - continue lantus and SSI - CBG monitoring

## 2020-01-26 NOTE — ED Notes (Signed)
Pt taken to bathroom via wheelchair. Worsening sob on exertion which resolved with rest.

## 2020-01-26 NOTE — Progress Notes (Signed)
ANTICOAGULATION CONSULT NOTE - Initial Consult  Pharmacy Consult for heparin Indication: chest pain/ACS  Allergies  Allergen Reactions  . Ciprofloxacin Shortness Of Breath and Other (See Comments)    Dizziness   . Hydrocodone Shortness Of Breath and Other (See Comments)    Tachycardia  . Penicillins Hives    Pt just remembers hives Has patient had a PCN reaction causing immediate rash, facial/tongue/throat swelling, SOB or lightheadedness with hypotension: No Has patient had a PCN reaction causing severe rash involving mucus membranes or skin necrosis: No Has patient had a PCN reaction that required hospitalization No Has patient had a PCN reaction occurring within the last 10 years: No If all of the above answers are "NO", then may proceed with Cephalosporin use.     Patient Measurements: Height: 5\' 6"  (167.6 cm) Weight: 132 kg (291 lb) IBW/kg (Calculated) : 59.3 Heparin Dosing Weight: 91.5 kg  Vital Signs: Temp: 98 F (36.7 C) (04/20 2028) Temp Source: Oral (04/20 2028) BP: 179/85 (04/21 0145) Pulse Rate: 69 (04/21 0145)  Labs: Recent Labs    01/25/20 2017 01/25/20 2226  HGB 9.0*  --   HCT 30.0*  --   PLT 260  --   CREATININE 1.78*  --   TROPONINIHS 5 5    Estimated Creatinine Clearance: 41.6 mL/min (A) (by C-G formula based on SCr of 1.78 mg/dL (H)).   Medical History: Past Medical History:  Diagnosis Date  . Anxiety   . Arthritis   . Blood dyscrasia    "FREE BLEEDER"  . CERVICAL RADICULOPATHY, LEFT   . Chronic back pain   . CKD (chronic kidney disease)    DR. SANFORD  [facility]  . COMMON MIGRAINE   . CORONARY ARTERY DISEASE   . Cough    CURRENT COLD  . Cystitis   . DEPRESSION   . Diabetes mellitus, type II ([place])   . DIVERTICULOSIS-COLON   . Dysrhythmia    palpitations  . Gastric ulcer 04/2008  . Gastroparesis   . GERD (gastroesophageal reflux disease)   . Hiatal hernia   . Hyperlipidemia   . Hypertension   . Hypothyroidism   .  INSOMNIA-SLEEP DISORDER-UNSPEC   . Iron deficiency anemia   . Wears glasses     Medications:  See medications  Assessment: 69 yo lady to start heparin for CP.  She was not on anticoagulation PTA.  Hg 9.0, PTLC 260 Goal of Therapy:  Heparin level 0.3-0.7 units/ml Monitor platelets by anticoagulation protocol: Yes   Plan:  Heparin 4000 unit bolus and drip at 1100 units/hr Check heparin level 6-8 hours after start Daily HL and CBC Monitor for bleeding complications  Thanks for allowing pharmacy to be a part of this patient's care.  Excell Seltzer, PharmD Clinical Pharmacist 01/26/2020,3:15 AM

## 2020-01-26 NOTE — Consult Note (Signed)
Reason for Consult:chest pain Referring Physician:triad hospitalist  [name] is an 69 y.o. female.  [name] is a 69 year old female with past medical history significant for hypertension, diabetes mellitus, hyperlipidemia, morbid obesity, chronic kidney disease stage III, anemia of chronic disease, degenerative joint disease, history of diverticulosis, GERD, came to the ER complaining of recurrent retrosternal chest pain, occasionally radiating to left shoulder and left side of the neck without associated nausea, vomiting or diaphoresis.  She took a total of 3 sublingual nitroglycerin with relief and again had similar pain in the evening, so decided to come to the ED.  EKG done in the ED showed normal sinus rhythm with poor R-wave progression in anterior leads.  2 sets of high sensitivity troponin I were negative.  Patient had nuclear stress test in October 2018 which showed no evidence of reversible ischemia with normal LV systolic function.  Patient denies any exertional chest pain, although her activity is limited.denies any palpitation, lightheadedness or syncope.  Past Medical History:  Diagnosis Date  . Anxiety   . Arthritis   . Blood dyscrasia    "FREE BLEEDER"  . CERVICAL RADICULOPATHY, LEFT   . Chronic back pain   . CKD (chronic kidney disease)    DR. SANFORD  [name]  . COMMON MIGRAINE   . CORONARY ARTERY DISEASE   . Cough    CURRENT COLD  . Cystitis   . DEPRESSION   . Diabetes mellitus, type II ([place])   . DIVERTICULOSIS-COLON   . Dysrhythmia    palpitations  . Gastric ulcer 04/2008  . Gastroparesis   . GERD (gastroesophageal reflux disease)   . Hiatal hernia   . Hyperlipidemia   . Hypertension   . Hypothyroidism   . INSOMNIA-SLEEP DISORDER-UNSPEC   . Iron deficiency anemia   . Wears glasses     Past Surgical History:  Procedure Laterality Date  . ABDOMINAL HYSTERECTOMY    . ANTERIOR LAT LUMBAR FUSION Left 08/13/2017   Procedure: LEFT SIDED LUMBAR  3-4 LATERAL INTERBODY FUSION WITH INSTRUMENTATION AND ALLOGRAFT;  Surgeon: Phylliss Bob, MD;  Location: [facility];  Service: Orthopedics;  Laterality: Left;  LEFT SIDED LUMBAR 3-4 LATERAL INTERBODY FUSION WITH INSTRUMENTATION AND ALLOGRAFT; REQUEST 3 HOURS  . Back Stimulator  07/2018  . BACK SURGERY  03/2016  . BREAST EXCISIONAL BIOPSY Right    40 years ago  . CHOLECYSTECTOMY  06/2009  . COLONOSCOPY    . ESOPHAGOGASTRODUODENOSCOPY    . EYE SURGERY Bilateral    lasik  . Gastric Wedge resection lipoma  11/2007   x2 with laparotomy and gastrostomy  . JOINT REPLACEMENT    . Left knee replacement    . Rigth knee replacement with revision  04/2008   Dr. Berenice Primas  . ROTATOR CUFF REPAIR Left 01/2009  . s/p bladder surgury  09/2009   Dr. Terance Hart  . SPINAL CORD STIMULATOR INSERTION N/A 09/11/2018   Procedure: LUMBAR SPINAL CORD STIMULATOR INSERTION;  Surgeon: Clydell Hakim, MD;  Location: [facility];  Service: Neurosurgery;  Laterality: N/A;  LUMBAR SPINAL CORD STIMULATOR INSERTION    Family History  Problem Relation Age of Onset  . Diabetes Sister        x 3  . Heart disease Sister        x2  . Breast cancer Sister 42  . Diabetes Brother        x3  . Heart disease Brother        x2  . Coronary artery disease Other  female 1st degree  . Hypertension Other   . Breast cancer Sister 31  . Breast cancer Other 25  . Diabetes Brother   . Colon cancer Neg Hx     Social History:  reports that she has quit smoking. She has never used smokeless tobacco. She reports that she does not drink alcohol or use drugs.  Allergies:  Allergies  Allergen Reactions  . Ciprofloxacin Shortness Of Breath and Other (See Comments)    Dizziness   . Hydrocodone Shortness Of Breath and Other (See Comments)    Tachycardia  . Penicillins Hives    Pt just remembers hives Has patient had a PCN reaction causing immediate rash, facial/tongue/throat swelling, SOB or lightheadedness with hypotension: No Has patient  had a PCN reaction causing severe rash involving mucus membranes or skin necrosis: No Has patient had a PCN reaction that required hospitalization No Has patient had a PCN reaction occurring within the last 10 years: No If all of the above answers are "NO", then may proceed with Cephalosporin use.     Medications: I have reviewed the patient's current medications.  Results for orders placed or performed during the hospital encounter of 01/25/20 (from the past 48 hour(s))  Basic metabolic panel     Status: Abnormal   Collection Time: 01/25/20  8:17 PM  Result Value Ref Range   Sodium 137 135 - 145 mmol/L   Potassium 4.7 3.5 - 5.1 mmol/L   Chloride 106 98 - 111 mmol/L   CO2 23 22 - 32 mmol/L   Glucose, Bld 177 (H) 70 - 99 mg/dL    Comment: Glucose reference range applies only to samples taken after fasting for at least 8 hours.   BUN 19 8 - 23 mg/dL   Creatinine, Ser 1.78 (H) 0.44 - 1.00 mg/dL   Calcium 9.5 8.9 - 10.3 mg/dL   GFR calc non Af Amer 29 (L) >60 mL/min   GFR calc Af Amer 33 (L) >60 mL/min   Anion gap 8 5 - 15    Comment: Performed at [facility] [address], [place], [place] [address]  CBC     Status: Abnormal   Collection Time: 01/25/20  8:17 PM  Result Value Ref Range   WBC 7.6 4.0 - 10.5 K/uL   RBC 2.81 (L) 3.87 - 5.11 MIL/uL   Hemoglobin 9.0 (L) 12.0 - 15.0 g/dL   HCT 30.0 (L) 36.0 - 46.0 %   MCV 106.8 (H) 80.0 - 100.0 fL   MCH 32.0 26.0 - 34.0 pg   MCHC 30.0 30.0 - 36.0 g/dL   RDW 13.0 11.5 - 15.5 %   Platelets 260 150 - 400 K/uL   nRBC 0.0 0.0 - 0.2 %    Comment: Performed at [facility], [place] [address], [place], [place] [address]  Troponin I (High Sensitivity)     Status: None   Collection Time: 01/25/20  8:17 PM  Result Value Ref Range   Troponin I (High Sensitivity) 5 <18 ng/L    Comment: (NOTE) Elevated high sensitivity troponin I (hsTnI) values and significant  changes across serial measurements may suggest ACS but many other   chronic and acute conditions are known to elevate hsTnI results.  Refer to the "Links" section for chest pain algorithms and additional  guidance. Performed at [facility], [place] [address], [place], [place] [address]   Troponin I (High Sensitivity)     Status: None   Collection Time:  01/25/20 10:26 PM  Result Value Ref Range   Troponin I (High Sensitivity) 5 <18 ng/L    Comment: (NOTE) Elevated high sensitivity troponin I (hsTnI) values and significant  changes across serial measurements may suggest ACS but many other  chronic and acute conditions are known to elevate hsTnI results.  Refer to the "Links" section for chest pain algorithms and additional  guidance. Performed at [facility], [place] [address] [place],  [address]     DG Chest Port 1 View  Result Date: 01/26/2020 CLINICAL DATA:  Chest pain EXAM: PORTABLE CHEST 1 VIEW COMPARISON:  08/07/2017 FINDINGS: Cardiomegaly. No confluent airspace opacities, effusions or edema. No acute bony abnormality. Spinal stimulator wires in the midthoracic spine region. IMPRESSION: Cardiomegaly.  No active disease. Electronically Signed   By: Rolm Baptise M.D.   On: 01/26/2020 03:49    Review of Systems  Constitutional: Negative for chills, diaphoresis and fever.  Eyes: Negative for itching.  Respiratory: Negative for shortness of breath.   Cardiovascular: Positive for chest pain. Negative for palpitations and leg swelling.  Gastrointestinal: Negative for abdominal pain.  Genitourinary: Negative for difficulty urinating.  Neurological: Negative for dizziness.   Blood pressure (!) 185/95, pulse 80, temperature 98 F (36.7 C), temperature source Oral, resp. rate 11, height 5\' 6"  (1.676 m), weight 132 kg, SpO2 100 %. Physical Exam  Constitutional: She is oriented to person, place, and time.  HENT:  Head: Normocephalic and atraumatic.  Eyes: Pupils are equal, round, and reactive to light. Conjunctivae are normal. No  scleral icterus.  Neck: No JVD present. No tracheal deviation present. No thyromegaly present.  Cardiovascular: Normal rate.  Murmur (soft systolic murmur noted.  No S3 gallop) heard. Respiratory: Effort normal and breath sounds normal. No respiratory distress. She has no wheezes. She has no rales.  GI: Bowel sounds are normal. She exhibits distension. There is no abdominal tenderness. There is no rebound and no guarding.  Musculoskeletal:        General: No tenderness, deformity or edema.     Cervical back: Neck supple.  Neurological: She is oriented to person, place, and time. She has normal reflexes.    Assessment/Plan: Unstable angina.  MI ruled out. Uncontrolled hypertension. Diabetes mellitus. Hyperlipidemia. Morbid obesity. Chronic kidney disease stage III. Anemia of chronic disease. Degenerative joint disease. GERD. History of diverticulosis. Plan Agree with present management. Add nitrates as per orders. Schedule for [facility]  Charolette Forward 01/26/2020, 8:24 AM

## 2020-01-26 NOTE — Progress Notes (Signed)
Report received from [facility] from ER.

## 2020-01-26 NOTE — ED Notes (Signed)
Patient sheets changed at this time

## 2020-01-26 NOTE — ED Notes (Signed)
Family visiting pt.  MD contacted regarding diet order.

## 2020-01-26 NOTE — Progress Notes (Addendum)
Admitted earlier this morning-seen and examined currently without chest pain.  In brief-69 year old female with history of DM-2, HTN, HLD, CKD stage III-who presented with chest pain with both typical and atypical features.  EKG without any acute changes, troponin negative.  On exam: Chest: Clear to auscultation CVS: S1-S2 regular.  left upper precordium is somewhat tender with deep palpation. Abdomen: Soft nontender Extremities no edema  neurology: Nonfocal  Assessment and plan: Chest pain-with both typical and atypical features-spoke with Dr. Terrence Dupont earlier this morning- nuclear stress test ordered (due to her weight it will be a 2 days stress test)  Rest as outlined in H&P.

## 2020-01-26 NOTE — Assessment & Plan Note (Signed)
Check lipid panel.  Continue statin. 

## 2020-01-26 NOTE — Assessment & Plan Note (Signed)
-   substernal in nature but non-exertional and appears to resolve on its worn; likely is noncardiac CP in nature - HEART score = 4 (age and risk factors) -Continue on heparin drip (recommended per cardioloy) Continue aspirin -Check TSH, lipid, A1c

## 2020-01-27 ENCOUNTER — Inpatient Hospital Stay (HOSPITAL_COMMUNITY): Payer: Medicare Other

## 2020-01-27 ENCOUNTER — Inpatient Hospital Stay: Payer: Medicare Other

## 2020-01-27 DIAGNOSIS — E039 Hypothyroidism, unspecified: Secondary | ICD-10-CM

## 2020-01-27 DIAGNOSIS — E785 Hyperlipidemia, unspecified: Secondary | ICD-10-CM

## 2020-01-27 DIAGNOSIS — I10 Essential (primary) hypertension: Secondary | ICD-10-CM

## 2020-01-27 LAB — GLUCOSE, CAPILLARY
Glucose-Capillary: 151 mg/dL — ABNORMAL HIGH (ref 70–99)
Glucose-Capillary: 179 mg/dL — ABNORMAL HIGH (ref 70–99)
Glucose-Capillary: 186 mg/dL — ABNORMAL HIGH (ref 70–99)

## 2020-01-27 LAB — HEMOGLOBIN A1C
Hgb A1c MFr Bld: 8.1 % — ABNORMAL HIGH (ref 4.8–5.6)
Mean Plasma Glucose: 185.77 mg/dL

## 2020-01-27 LAB — CBC
HCT: 28.1 % — ABNORMAL LOW (ref 36.0–46.0)
Hemoglobin: 8.7 g/dL — ABNORMAL LOW (ref 12.0–15.0)
MCH: 32.3 pg (ref 26.0–34.0)
MCHC: 31 g/dL (ref 30.0–36.0)
MCV: 104.5 fL — ABNORMAL HIGH (ref 80.0–100.0)
Platelets: 248 10*3/uL (ref 150–400)
RBC: 2.69 MIL/uL — ABNORMAL LOW (ref 3.87–5.11)
RDW: 12.9 % (ref 11.5–15.5)
WBC: 8.5 10*3/uL (ref 4.0–10.5)
nRBC: 0.4 % — ABNORMAL HIGH (ref 0.0–0.2)

## 2020-01-27 LAB — HEPARIN LEVEL (UNFRACTIONATED): Heparin Unfractionated: 0.57 IU/mL (ref 0.30–0.70)

## 2020-01-27 LAB — LIPID PANEL
Cholesterol: 175 mg/dL (ref 0–200)
HDL: 56 mg/dL (ref >40)
LDL Cholesterol: 105 mg/dL — ABNORMAL HIGH (ref 0–99)
Total CHOL/HDL Ratio: 3.1 RATIO
Triglycerides: 71 mg/dL (ref <150)
VLDL: 14 mg/dL (ref 0–40)

## 2020-01-27 LAB — TSH: TSH: 0.661 u[IU]/mL (ref 0.350–4.500)

## 2020-01-27 MED ORDER — NITROGLYCERIN 0.4 MG SL SUBL: 0.4000 mg | SUBLINGUAL_TABLET | SUBLINGUAL | 0 refills | Status: AC | PRN

## 2020-01-27 MED ORDER — ISOSORBIDE MONONITRATE ER 60 MG PO TB24: 60.0000 mg | ORAL_TABLET | Freq: Every day | ORAL | 0 refills | Status: DC

## 2020-01-27 MED ORDER — POLYETHYLENE GLYCOL 3350 17 G PO PACK
17.0000 g | PACK | Freq: Every day | ORAL | Status: DC
  Administered 2020-01-27: 17 g via ORAL
  Filled 2020-01-27: qty 1

## 2020-01-27 MED ORDER — POLYETHYLENE GLYCOL 3350 17 G PO PACK: 17.0000 g | PACK | Freq: Every day | ORAL | 0 refills | Status: DC | PRN

## 2020-01-27 MED ORDER — ISOSORBIDE MONONITRATE ER 60 MG PO TB24
60.0000 mg | ORAL_TABLET | Freq: Every day | ORAL | Status: DC
  Administered 2020-01-27: 60 mg via ORAL
  Filled 2020-01-27: qty 1

## 2020-01-27 MED ORDER — TECHNETIUM TC 99M TETROFOSMIN IV KIT
30.0000 | PACK | Freq: Once | INTRAVENOUS | Status: AC | PRN
  Administered 2020-01-27: 30 via INTRAVENOUS

## 2020-01-27 NOTE — Progress Notes (Signed)
Patient admits to no BM for 7 days.  Bowel sounds present all 4 quads.  RLQ hyperactive.

## 2020-01-27 NOTE — Discharge Summary (Addendum)
PATIENT DETAILS Name: [name] Age: 69 y.o. Sex: female Date of Birth: 08-Apr-1951 MRN: [id number]. Admitting Physician: Jonetta Osgood, MD MOQ:HUTM, Hunt Oris, MD  Admit Date: 01/25/2020 Discharge date: 01/27/2020  Recommendations for Outpatient Follow-up:  1. Follow up with PCP in 1-2 weeks 2. Please obtain CMP/CBC in one week  Admitted From:  Home  Disposition: [place]: Yes  Equipment/Devices: None  Discharge Condition: Stable  CODE STATUS: FULL CODE  Diet recommendation:  Diet Order            Diet - low sodium heart healthy        Diet Carb Modified        Diet heart healthy/carb modified Room service appropriate? Yes; Fluid consistency: Thin  Diet effective now               Brief Summary: See H&P, Labs, Consult and Test reports for all details in brief, patient is a 69 year old female with history of DM-2, HTN, HLD, CKD stage IIIb who presented with chest pain with both typical and atypical features.  She was admitted to the hospitalist service for further evaluation and treatment.  Brief Hospital Course: Chest pain: With both typical and atypical features.  Troponins were negative-EKG nonacute.  She continues to have intermittent chest pain-but this is reproducible to palpation of of the precordial area.  She was evaluated by cardiology-and underwent a nuclear stress test-that did not show any reversible ischemia.  She is stable to be discharged with follow-up with cardiology in the outpatient setting.  HTN: Blood pressure appears to be controlled-continue clonidine, losartan, Imdur and Lopressor on discharge.  Hypothyroidism: Continue Synthroid  HLD: Continue statin  DM-2: CBG stable-continue usual insulin regimen.  CKD stage IIIb: Close to your baseline-follow with PCP  Anemia: Likely secondary to his chronic kidney disease-stable for outpatient follow-up.  No indication any overt GI bleeding during this hospital  stay.  Constipation: Start MiraLAX-if this is not effective-follow with PCP.  Obesity: Estimated body mass index is 45.87 kg/m as calculated from the following:   Height as of this encounter: 5' 6" (1.676 m).   Weight as of this encounter: 128.9 kg.    Discharge Diagnoses:  Active Problems:   Diabetes ([place])   HLD (hyperlipidemia)   Essential hypertension   Hypothyroidism   Chest pain  Discharge Instructions:  Activity:  As tolerated   Discharge Instructions    Diet - low sodium heart healthy   Complete by: As directed    Diet Carb Modified   Complete by: As directed    Increase activity slowly   Complete by: As directed      Allergies as of 01/27/2020      Reactions   Ciprofloxacin Shortness Of Breath, Other (See Comments)   Dizziness   Hydrocodone Shortness Of Breath, Other (See Comments)   Tachycardia   Penicillins Hives   Pt just remembers hives Has patient had a PCN reaction causing immediate rash, facial/tongue/throat swelling, SOB or lightheadedness with hypotension: No Has patient had a PCN reaction causing severe rash involving mucus membranes or skin necrosis: No Has patient had a PCN reaction that required hospitalization No Has patient had a PCN reaction occurring within the last 10 years: No If all of the above answers are "NO", then may proceed with Cephalosporin use.      Medication List    STOP taking these medications   buPROPion 150 MG 24 hr tablet Commonly known as: Wellbutrin  XL     TAKE these medications   aspirin 81 MG EC tablet Take 81 mg by mouth every evening.   atorvastatin 40 MG tablet Commonly known as: LIPITOR TAKE ONE TABLET BY MOUTH EVERY NIGHT AT BEDTIME   cloNIDine 0.1 MG tablet Commonly known as: CATAPRES TAKE ONE TABLET BY MOUTH TWICE A DAY   Elmiron 100 MG capsule Generic drug: pentosan polysulfate Take 200 mg by mouth 2 (two) times daily.   FLUoxetine 20 MG capsule Commonly known as: PROZAC TAKE ONE CAPSULE BY  MOUTH DAILY What changed:   how much to take  how to take this  when to take this  additional instructions   FreeStyle Libre 14 Day Reader Kerrin Mo by Does not apply route.   gabapentin 300 MG capsule Commonly known as: NEURONTIN Take 300 mg by mouth 3 (three) times daily.   glucose blood test strip Commonly known as: FREESTYLE TEST STRIPS Use as instructed to check blood sugar 4 times daily.   Insulin Lispro Prot & Lispro (75-25) 100 UNIT/ML Kwikpen Commonly known as: HumaLOG Mix 75/25 KwikPen Inject 20 Units into the skin daily with breakfast. What changed:   how much to take  when to take this   isosorbide mononitrate 60 MG 24 hr tablet Commonly known as: IMDUR Take 1 tablet (60 mg total) by mouth daily. Start taking on: January 28, 2020   Lantus SoloStar 100 UNIT/ML Solostar Pen Generic drug: insulin glargine Inject 20 Units into the skin daily.   levothyroxine 125 MCG tablet Commonly known as: SYNTHROID Take 1 tablet by mouth daily, Sunday-Friday. On Saturday, take 1.5 tablets. What changed:   how much to take  how to take this  when to take this  additional instructions   linaclotide 72 MCG capsule Commonly known as: Linzess Take 1 capsule (72 mcg total) by mouth daily before breakfast.   losartan 100 MG tablet Commonly known as: COZAAR Take one tab daily What changed:   how much to take  how to take this  when to take this  additional instructions   metoprolol succinate 50 MG 24 hr tablet Commonly known as: TOPROL-XL TAKE 1 TABLET BY MOUTH DAILY WITH OR IMMEDIATELY FOLLOWING A MEAL What changed: See the new instructions.   naltrexone 50 MG tablet Commonly known as: DEPADE Take 1 tablet (50 mg total) by mouth daily.   nitroGLYCERIN 0.4 MG SL tablet Commonly known as: NITROSTAT Place 1 tablet (0.4 mg total) under the tongue every 5 (five) minutes as needed for chest pain.   ondansetron 4 MG tablet Commonly known as: Zofran Take 1  tablet (4 mg total) by mouth every 8 (eight) hours as needed for nausea or vomiting.   pantoprazole 40 MG tablet Commonly known as: PROTONIX TAKE 1 TABLET(40 MG) BY MOUTH TWICE DAILY What changed: See the new instructions.   polyethylene glycol 17 g packet Commonly known as: MIRALAX / GLYCOLAX Take 17 g by mouth daily as needed.   promethazine 25 MG tablet Commonly known as: PHENERGAN Take 1 tablet (25 mg total) by mouth every 8 (eight) hours as needed for nausea or vomiting.   Suprep Bowel Prep Kit 17.5-3.13-1.6 GM/177ML Soln Generic drug: Na Sulfate-K Sulfate-Mg Sulf Take 1 kit by mouth as directed. For colonoscopy prep   traMADol 50 MG tablet Commonly known as: ULTRAM Take 1 tablet (50 mg total) by mouth every 6 (six) hours as needed.      Follow-up Information    Biagio Borg, MD.  Schedule an appointment as soon as possible for a visit in 1 month(s).   Specialties: Internal Medicine, Radiology Contact information: [place] [address] [phone]        Charolette Forward, MD. Schedule an appointment as soon as possible for a visit in 1 week(s).   Specialty: Cardiology Contact information: [place] [address] [phone]          Allergies  Allergen Reactions  . Ciprofloxacin Shortness Of Breath and Other (See Comments)    Dizziness   . Hydrocodone Shortness Of Breath and Other (See Comments)    Tachycardia  . Penicillins Hives    Pt just remembers hives Has patient had a PCN reaction causing immediate rash, facial/tongue/throat swelling, SOB or lightheadedness with hypotension: No Has patient had a PCN reaction causing severe rash involving mucus membranes or skin necrosis: No Has patient had a PCN reaction that required hospitalization No Has patient had a PCN reaction occurring within the last 10 years: No If all of the above answers are "NO", then may proceed with Cephalosporin use.      Consultations:   cardiology   Other Procedures/Studies: NM Myocar Multi W/Spect W/Wall Motion / EF  Result Date: 01/27/2020 CLINICAL DATA:  69 year old female with chest pain EXAM: MYOCARDIAL IMAGING WITH SPECT (REST AND PHARMACOLOGIC-STRESS) GATED LEFT VENTRICULAR WALL MOTION STUDY LEFT VENTRICULAR EJECTION FRACTION TECHNIQUE: Standard myocardial SPECT imaging was performed after resting intravenous injection of 10 mCi Tc-53mtetrofosmin. Subsequently, intravenous infusion of Lexiscan was performed under the supervision of the Cardiology staff. At peak effect of the drug, 30 mCi Tc-935metrofosmin was injected intravenously and standard myocardial SPECT imaging was performed. Quantitative gated imaging was also performed to evaluate left ventricular wall motion, and estimate left ventricular ejection fraction. COMPARISON:  None. FINDINGS: Perfusion: No decreased activity in the left ventricle on stress imaging to suggest reversible ischemia or infarction. Wall Motion: Normal left ventricular wall motion. No left ventricular dilation. Left Ventricular Ejection Fraction: 66 % End diastolic volume 87 ml End systolic volume 30 ml IMPRESSION: 1. No reversible ischemia or infarction. 2. Normal left ventricular wall motion. 3. Left ventricular ejection fraction 66% 4. Non invasive risk stratification*: Low *2012 Appropriate Use Criteria for Coronary Revascularization Focused Update: J Am Coll Cardiol. 201561;53(7):943-276http://content.[email or web address]?articleid=[email or web address] Electronically Signed   By: JaCorrie Mckusick.O.   On: 01/27/2020 17:08   DG Chest Port 1 View  Result Date: 01/26/2020 CLINICAL DATA:  Chest pain EXAM: PORTABLE CHEST 1 VIEW COMPARISON:  08/07/2017 FINDINGS: Cardiomegaly. No confluent airspace opacities, effusions or edema. No acute bony abnormality. Spinal stimulator wires in the midthoracic spine region. IMPRESSION: Cardiomegaly.  No active disease. Electronically Signed   By:  KeRolm Baptise.D.   On: 01/26/2020 03:49     TODAY-DAY OF DISCHARGE:  Subjective:   MiHyman Boweroday has no headache,no chest abdominal pain,no new weakness tingling or numbness, feels much better wants to go home today.   Objective:   Blood pressure 134/77, pulse 66, temperature 98.1 F (36.7 C), temperature source Oral, resp. rate 17, height 5' 6" (1.676 m), weight 128.9 kg, SpO2 96 %.  Intake/Output Summary (Last 24 hours) at 01/27/2020 1749 Last data filed at 01/27/2020 1000 Gross per 24 hour  Intake 220 ml  Output 400 ml  Net -180 ml   Filed Weights   01/25/20 2006 01/26/20 2033  Weight: 132 kg 128.9 kg    Exam: Awake Alert, Oriented *3, No new  F.N deficits, Normal affect [place].AT,PERRAL Supple Neck,No JVD, No cervical lymphadenopathy appriciated.  Symmetrical Chest wall movement, Good air movement bilaterally, CTAB RRR,No Gallops,Rubs or new Murmurs, No Parasternal Heave +ve B.Sounds, Abd Soft, Non tender, No organomegaly appriciated, No rebound -guarding or rigidity. No Cyanosis, Clubbing or edema, No new Rash or bruise   PERTINENT RADIOLOGIC STUDIES: NM Myocar Multi W/Spect W/Wall Motion / EF  Result Date: 01/27/2020 CLINICAL DATA:  69 year old female with chest pain EXAM: MYOCARDIAL IMAGING WITH SPECT (REST AND PHARMACOLOGIC-STRESS) GATED LEFT VENTRICULAR WALL MOTION STUDY LEFT VENTRICULAR EJECTION FRACTION TECHNIQUE: Standard myocardial SPECT imaging was performed after resting intravenous injection of 10 mCi Tc-14mtetrofosmin. Subsequently, intravenous infusion of Lexiscan was performed under the supervision of the Cardiology staff. At peak effect of the drug, 30 mCi Tc-946metrofosmin was injected intravenously and standard myocardial SPECT imaging was performed. Quantitative gated imaging was also performed to evaluate left ventricular wall motion, and estimate left ventricular ejection fraction. COMPARISON:  None. FINDINGS: Perfusion: No decreased activity in the  left ventricle on stress imaging to suggest reversible ischemia or infarction. Wall Motion: Normal left ventricular wall motion. No left ventricular dilation. Left Ventricular Ejection Fraction: 66 % End diastolic volume 87 ml End systolic volume 30 ml IMPRESSION: 1. No reversible ischemia or infarction. 2. Normal left ventricular wall motion. 3. Left ventricular ejection fraction 66% 4. Non invasive risk stratification*: Low *2012 Appropriate Use Criteria for Coronary Revascularization Focused Update: J Am Coll Cardiol. 205329;92(4):268-341http://content.onairportbarriers.comspx?articleid=[email or web address] Electronically Signed   By: JaCorrie Mckusick.O.   On: 01/27/2020 17:08   DG Chest Port 1 View  Result Date: 01/26/2020 CLINICAL DATA:  Chest pain EXAM: PORTABLE CHEST 1 VIEW COMPARISON:  08/07/2017 FINDINGS: Cardiomegaly. No confluent airspace opacities, effusions or edema. No acute bony abnormality. Spinal stimulator wires in the midthoracic spine region. IMPRESSION: Cardiomegaly.  No active disease. Electronically Signed   By: KeRolm Baptise.D.   On: 01/26/2020 03:49     PERTINENT LAB RESULTS: CBC: Recent Labs    01/25/20 2017 01/26/20 2332  WBC 7.6 8.5  HGB 9.0* 8.7*  HCT 30.0* 28.1*  PLT 260 248   CMET CMP     Component Value Date/Time   NA 137 01/25/2020 2017   K 4.7 01/25/2020 2017   CL 106 01/25/2020 2017   CO2 23 01/25/2020 2017   GLUCOSE 177 (H) 01/25/2020 2017   BUN 19 01/25/2020 2017   CREATININE 1.78 (H) 01/25/2020 2017   CREATININE 1.67 (H) 11/05/2014 0712   CALCIUM 9.5 01/25/2020 2017   PROT 7.8 12/27/2019 1627   ALBUMIN 4.0 12/27/2019 1627   AST 16 12/27/2019 1627   ALT 13 12/27/2019 1627   ALKPHOS 93 12/27/2019 1627   BILITOT 1.0 12/27/2019 1627   GFRNONAA 29 (L) 01/25/2020 2017   GFRAA 33 (L) 01/25/2020 2017    GFR Estimated Creatinine Clearance: 41 mL/min (A) (by C-G formula based on SCr of 1.78 mg/dL (H)). No results for input(s): LIPASE, AMYLASE in the last  72 hours. No results for input(s): CKTOTAL, CKMB, CKMBINDEX, TROPONINI in the last 72 hours. Invalid input(s): POCBNP No results for input(s): DDIMER in the last 72 hours. Recent Labs    01/26/20 2332  HGBA1C 8.1*   Recent Labs    01/25/20 1558 01/26/20 2332  CHOL  --  175  HDL  --  56  LDLCALC  --  105*  TRIG  --  71  CHOLHDL  --  3.1  LDLDIRECT 87.0  --  Recent Labs    01/26/20 2332  TSH 0.661   No results for input(s): VITAMINB12, FOLATE, FERRITIN, TIBC, IRON, RETICCTPCT in the last 72 hours. Coags: No results for input(s): INR in the last 72 hours.  Invalid input(s): PT Microbiology: Recent Results (from the past 240 hour(s))  SARS CORONAVIRUS 2 (TAT 6-24 HRS) Nasopharyngeal Nasopharyngeal Swab     Status: None   Collection Time: 01/26/20  4:06 AM   Specimen: Nasopharyngeal Swab  Result Value Ref Range Status   SARS Coronavirus 2 NEGATIVE NEGATIVE Final    Comment: (NOTE) SARS-CoV-2 target nucleic acids are NOT DETECTED. The SARS-CoV-2 RNA is generally detectable in upper and lower respiratory specimens during the acute phase of infection. Negative results do not preclude SARS-CoV-2 infection, do not rule out co-infections with other pathogens, and should not be used as the sole basis for treatment or other patient management decisions. Negative results must be combined with clinical observations, patient history, and epidemiological information. The expected result is Negative. Fact Sheet for Patients: SugarRoll.be Fact Sheet for Healthcare Providers: https://www.woods-mathews.com/ This test is not yet approved or cleared by the [place] FDA and  has been authorized for detection and/or diagnosis of SARS-CoV-2 by FDA under an Emergency Use Authorization (EUA). This EUA will remain  in effect (meaning this test can be used) for the duration of the COVID-19 declaration under Section 56 4(b)(1) of the Act, 21  U.S.C. section 360bbb-3(b)(1), unless the authorization is terminated or revoked sooner. Performed at [facility], [place] [address], [place], [place] [address]     FURTHER DISCHARGE INSTRUCTIONS:  Get Medicines reviewed and adjusted: Please take all your medications with you for your next visit with your Primary MD  Laboratory/radiological data: Please request your Primary MD to go over all hospital tests and procedure/radiological results at the follow up, please ask your Primary MD to get all Hospital records sent to his/her office.  In some cases, they will be blood work, cultures and biopsy results pending at the time of your discharge. Please request that your primary care M.D. goes through all the records of your hospital data and follows up on these results.  Also Note the following: If you experience worsening of your admission symptoms, develop shortness of breath, life threatening emergency, suicidal or homicidal thoughts you must seek medical attention immediately by calling 911 or calling your MD immediately  if symptoms less severe.  You must read complete instructions/literature along with all the possible adverse reactions/side effects for all the Medicines you take and that have been prescribed to you. Take any new Medicines after you have completely understood and accpet all the possible adverse reactions/side effects.   Do not drive when taking Pain medications or sleeping medications (Benzodaizepines)  Do not take more than prescribed Pain, Sleep and Anxiety Medications. It is not advisable to combine anxiety,sleep and pain medications without talking with your primary care practitioner  Special Instructions: If you have smoked or chewed Tobacco  in the last 2 yrs please stop smoking, stop any regular Alcohol  and or any Recreational drug use.  Wear Seat belts while driving.  Please note: You were cared for by a hospitalist during your hospital stay. Once you  are discharged, your primary care physician will handle any further medical issues. Please note that NO REFILLS for any discharge medications will be authorized once you are discharged, as it is imperative that you return to your primary care physician (or establish a relationship with  a primary care physician if you do not have one) for your post hospital discharge needs so that they can reassess your need for medications and monitor your lab values.  Total Time spent coordinating discharge including counseling, education and face to face time equals 25 minutes.  SignedOren Binet 01/27/2020 5:49 PM

## 2020-01-27 NOTE — Progress Notes (Signed)
Please call: Thyroid is on the higher side of normal.  Continue 1-1/2 pills of the levothyroxine 125 but only take 1 pill on Saturdays and Sundays

## 2020-01-27 NOTE — Progress Notes (Signed)
Discharge:   CCMD notified, IV access discontinued without difficulty.  Spouse present for discharge summary review and medication summary changes.  Patient assisted by staff to hospital exit.

## 2020-01-27 NOTE — Progress Notes (Signed)
[place] for heparin Indication: chest pain/ACS  Allergies  Allergen Reactions  . Ciprofloxacin Shortness Of Breath and Other (See Comments)    Dizziness   . Hydrocodone Shortness Of Breath and Other (See Comments)    Tachycardia  . Penicillins Hives    Pt just remembers hives Has patient had a PCN reaction causing immediate rash, facial/tongue/throat swelling, SOB or lightheadedness with hypotension: No Has patient had a PCN reaction causing severe rash involving mucus membranes or skin necrosis: No Has patient had a PCN reaction that required hospitalization No Has patient had a PCN reaction occurring within the last 10 years: No If all of the above answers are "NO", then may proceed with Cephalosporin use.     Patient Measurements: Height: 5\' 6"  (167.6 cm) Weight: 128.9 kg (284 lb 3.2 oz) IBW/kg (Calculated) : 59.3 Heparin Dosing Weight: 91.5 kg  Vital Signs: Temp: 98.4 F (36.9 C) (04/22 0030) Temp Source: Oral (04/22 0030) BP: 153/82 (04/22 0030) Pulse Rate: 74 (04/22 0030)  Labs: Recent Labs    01/25/20 1558 01/25/20 2017 01/25/20 2226 01/26/20 1508 01/26/20 2332  HGB  --  9.0*  --   --  8.7*  HCT  --  30.0*  --   --  28.1*  PLT  --  260  --   --  248  HEPARINUNFRC  --   --   --  0.77* 0.57  CREATININE 1.67* 1.78*  --   --   --   TROPONINIHS  --  5 5  --   --     Estimated Creatinine Clearance: 41 mL/min (A) (by C-G formula based on SCr of 1.78 mg/dL (H)).  Assessment: 69 y.o. female with chest pain for heparin  Goal of Therapy:  Heparin level 0.3-0.7 units/ml Monitor platelets by anticoagulation protocol: Yes   Plan:  Continue Heparin at current rate   Phillis Knack, PharmD, BCPS  01/27/2020,12:57 AM

## 2020-01-27 NOTE — Progress Notes (Signed)
Subjective:  Patient denies any anginal chest pain tolerated stress portion of the nuclear stress test yesterday resting second portion of the test being done today results not available yet.  Objective:  Vital Signs in the last 24 hours: Temp:  [98 F (36.7 C)-98.9 F (37.2 C)] 98 F (36.7 C) (04/22 0819) Pulse Rate:  [67-100] 75 (04/22 0819) Resp:  [15-23] 15 (04/22 0819) BP: (151-210)/(70-137) 168/97 (04/22 0819) SpO2:  [97 %-100 %] 99 % (04/22 0819) Weight:  [128.9 kg] 128.9 kg (04/21 2033)  Intake/Output from previous day: 04/21 0701 - 04/22 0700 In: 100 [I.V.:100] Out: 950 [Urine:950] Intake/Output from this shift: No intake/output data recorded.  Physical Exam: Neck: no adenopathy, no carotid bruit, no JVD and supple, symmetrical, trachea midline Lungs: clear to auscultation bilaterally Heart: regular rate and rhythm, S1, S2 normal and Soft systolic murmur noted no S3 gallop Abdomen: soft, non-tender; bowel sounds normal; no masses,  no organomegaly Extremities: extremities normal, atraumatic, no cyanosis or edema  Lab Results: Recent Labs    01/25/20 2017 01/26/20 2332  WBC 7.6 8.5  HGB 9.0* 8.7*  PLT 260 248   Recent Labs    01/25/20 1558 01/25/20 2017  NA 137 137  K 4.6 4.7  CL 105 106  CO2 26 23  GLUCOSE 152* 177*  BUN 19 19  CREATININE 1.67* 1.78*   No results for input(s): TROPONINI in the last 72 hours.  Invalid input(s): CK, MB Hepatic Function Panel No results for input(s): PROT, ALBUMIN, AST, ALT, ALKPHOS, BILITOT, BILIDIR, IBILI in the last 72 hours. No results for input(s): CHOL in the last 72 hours. No results for input(s): PROTIME in the last 72 hours.  Imaging: Imaging results have been reviewed and DG Chest Port 1 View  Result Date: 01/26/2020 CLINICAL DATA:  Chest pain EXAM: PORTABLE CHEST 1 VIEW COMPARISON:  08/07/2017 FINDINGS: Cardiomegaly. No confluent airspace opacities, effusions or edema. No acute bony abnormality. Spinal  stimulator wires in the midthoracic spine region. IMPRESSION: Cardiomegaly.  No active disease. Electronically Signed   By: Rolm Baptise M.D.   On: 01/26/2020 03:49    Cardiac Studies:  Assessment/Plan:  Stable angina MI ruled out Uncontrolled hypertension. Diabetes mellitus. Hyperlipidemia. Morbid obesity. Chronic kidney disease stage III. Anemia of chronic disease. Degenerative joint disease. GERD. History of diverticulosis. Plan Continue present management Check Lexiscan Myoview results if negative for ischemia okay to discharge from cardiac point of view.  May need anemia work-up as per PMD as outpatient Change Nitropaste to Imdur as per orders  LOS: 1 day    Charolette Forward 01/27/2020, 9:56 AM

## 2020-01-28 ENCOUNTER — Telehealth: Payer: Self-pay | Admitting: *Deleted

## 2020-01-28 ENCOUNTER — Telehealth: Payer: Self-pay | Admitting: Endocrinology

## 2020-01-28 NOTE — Telephone Encounter (Signed)
Patient called stating she thought Olen Cordial had called her yesterday and was returning his phone call. Please advise. [phone]

## 2020-01-28 NOTE — Telephone Encounter (Signed)
Transition Care Management Follow-up Telephone Call   Date discharged? 01/27/20   How have you been since you were released from the hospital? Pt states she is doing well   Do you understand why you were in the hospital? YES    Do you understand the discharge instructions? YES   Where were you discharged to? Home   Items Reviewed:  Medications reviewed: YES, no longer taking Wellbutrin   Allergies reviewed: YES  Dietary changes reviewed: YES, carb modified and heart healthy  Referrals reviewed: No referral recommeded   Functional Questionnaire:   Activities of Daily Living (ADLs):   She states she are independent in the following: ambulation, bathing and hygiene, feeding, continence, grooming, toileting and dressing States she doesn't require assistance    Any transportation issues/concerns?: NO   Any patient concerns? NO   Confirmed importance and date/time of follow-up visits scheduled YES, appt 02/04/20  Provider Appointment booked with Dr. Jenny Reichmann  Confirmed with patient if condition begins to worsen call PCP or go to the ER.  Patient was given the office number and encouraged to call back with question or concerns.  : YES

## 2020-01-28 NOTE — Telephone Encounter (Signed)
Message was about changing how she takes her medication. This message was given to pt's husband and he stated that he would inform her.

## 2020-01-31 DIAGNOSIS — N3010 Interstitial cystitis (chronic) without hematuria: Secondary | ICD-10-CM | POA: Diagnosis not present

## 2020-01-31 DIAGNOSIS — R3914 Feeling of incomplete bladder emptying: Secondary | ICD-10-CM | POA: Diagnosis not present

## 2020-01-31 DIAGNOSIS — N3020 Other chronic cystitis without hematuria: Secondary | ICD-10-CM | POA: Diagnosis not present

## 2020-02-02 ENCOUNTER — Ambulatory Visit
Admission: RE | Admit: 2020-02-02 | Discharge: 2020-02-02 | Disposition: A | Discharge status: Home / Self Care | Payer: Medicare Other | Source: Ambulatory Visit | Attending: Podiatry | Admitting: Podiatry

## 2020-02-02 DIAGNOSIS — T148XXA Other injury of unspecified body region, initial encounter: Secondary | ICD-10-CM

## 2020-02-02 DIAGNOSIS — M79672 Pain in left foot: Secondary | ICD-10-CM

## 2020-02-02 DIAGNOSIS — M76822 Posterior tibial tendinitis, left leg: Secondary | ICD-10-CM

## 2020-02-02 DIAGNOSIS — M25572 Pain in left ankle and joints of left foot: Secondary | ICD-10-CM

## 2020-02-02 DIAGNOSIS — M65872 Other synovitis and tenosynovitis, left ankle and foot: Secondary | ICD-10-CM | POA: Diagnosis not present

## 2020-02-04 ENCOUNTER — Ambulatory Visit (INDEPENDENT_AMBULATORY_CARE_PROVIDER_SITE_OTHER): Payer: Medicare Other | Admitting: Internal Medicine

## 2020-02-04 ENCOUNTER — Encounter: Payer: Self-pay | Admitting: Internal Medicine

## 2020-02-04 ENCOUNTER — Other Ambulatory Visit: Payer: Self-pay

## 2020-02-04 VITALS — BP 140/80 | HR 96 | Temp 99.1°F | Ht 66.0 in | Wt 295.0 lb

## 2020-02-04 DIAGNOSIS — E669 Obesity, unspecified: Secondary | ICD-10-CM | POA: Diagnosis not present

## 2020-02-04 DIAGNOSIS — R079 Chest pain, unspecified: Secondary | ICD-10-CM | POA: Diagnosis not present

## 2020-02-04 DIAGNOSIS — R509 Fever, unspecified: Secondary | ICD-10-CM | POA: Insufficient documentation

## 2020-02-04 DIAGNOSIS — E119 Type 2 diabetes mellitus without complications: Secondary | ICD-10-CM | POA: Diagnosis not present

## 2020-02-04 DIAGNOSIS — N184 Chronic kidney disease, stage 4 (severe): Secondary | ICD-10-CM

## 2020-02-04 MED ORDER — PHENTERMINE HCL 37.5 MG PO CAPS: 37.5000 mg | ORAL_CAPSULE | ORAL | 2 refills | Status: DC

## 2020-02-04 NOTE — Assessment & Plan Note (Signed)
Recent stress test neg, no further pain, continue to follow

## 2020-02-04 NOTE — Assessment & Plan Note (Signed)
stable overall by history and exam, recent data reviewed with pt, and pt to continue medical treatment as before,  to f/u any worsening symptoms or concerns, for f/u renal as planned

## 2020-02-04 NOTE — Assessment & Plan Note (Signed)
[place] for phentermine asd, d/c naltrexone

## 2020-02-04 NOTE — Assessment & Plan Note (Signed)
stable overall by history and exam, recent data reviewed with pt, and pt to continue medical treatment as before,  to f/u any worsening symptoms or concerns, refer dm education

## 2020-02-04 NOTE — Assessment & Plan Note (Signed)
Asympt, exam benign, cont to follow

## 2020-02-04 NOTE — Patient Instructions (Addendum)
Please take all new medication as prescribed - the phentermine  You will be contacted regarding the referral for: Diabetic education  Please continue all other medications as before, and refills have been done if requested.  Please have the pharmacy call with any other refills you may need.  Please continue your efforts at being more active, low salt, low cholesterol diabetic diet, and weight control.  Please keep your appointments with your specialists as you may have planned - Dr Joelyn Oms next week, and Cardiology, and Dr Dwyane Dee  Please make an Appointment to return in 6 months, or sooner if needed

## 2020-02-04 NOTE — Progress Notes (Signed)
Subjective:    Patient ID: [name], female    DOB: [birth year], 69 y.o.   MRN: [id number]  HPI  Here to f/u post hospn 4/20-4/22; 69 year old female with history of DM-2, HTN, HLD, CKD stage IIIb who presented with chest pain with both typical and atypical features.  Troponins were negative-EKG nonacute.  She continues to have intermittent chest pain-but this is reproducible to palpation of of the precordial area.  She was evaluated by cardiology-and underwent a nuclear stress test-that did not show any reversible ischemia.  Recommended to f/u here with cKD, and start miralax for constipation now improved.  Has f/u planned with renal and cardiology.  Has low grade temp today but denies fever, chills. HA, sT, cough, abd pain, diarrhea or dysuria.  Has not been able to lose wt, has been to DM education years ago but needs redo.   Past Medical History:  Diagnosis Date  . Anxiety   . Arthritis   . Blood dyscrasia    "FREE BLEEDER"  . CERVICAL RADICULOPATHY, LEFT   . Chronic back pain   . CKD (chronic kidney disease)    DR. SANFORD  [facility]  . COMMON MIGRAINE   . CORONARY ARTERY DISEASE   . Cough    CURRENT COLD  . Cystitis   . DEPRESSION   . Diabetes mellitus, type II ([place])   . DIVERTICULOSIS-COLON   . Dysrhythmia    palpitations  . Gastric ulcer 04/2008  . Gastroparesis   . GERD (gastroesophageal reflux disease)   . Hiatal hernia   . Hyperlipidemia   . Hypertension   . Hypothyroidism   . INSOMNIA-SLEEP DISORDER-UNSPEC   . Iron deficiency anemia   . Wears glasses    Past Surgical History:  Procedure Laterality Date  . ABDOMINAL HYSTERECTOMY    . ANTERIOR LAT LUMBAR FUSION Left 08/13/2017   Procedure: LEFT SIDED LUMBAR 3-4 LATERAL INTERBODY FUSION WITH INSTRUMENTATION AND ALLOGRAFT;  Surgeon: Phylliss Bob, MD;  Location: [facility];  Service: Orthopedics;  Laterality: Left;  LEFT SIDED LUMBAR 3-4 LATERAL INTERBODY FUSION WITH INSTRUMENTATION AND ALLOGRAFT; REQUEST 3  HOURS  . Back Stimulator  07/2018  . BACK SURGERY  03/2016  . BREAST EXCISIONAL BIOPSY Right    40 years ago  . CHOLECYSTECTOMY  06/2009  . COLONOSCOPY    . ESOPHAGOGASTRODUODENOSCOPY    . EYE SURGERY Bilateral    lasik  . Gastric Wedge resection lipoma  11/2007   x2 with laparotomy and gastrostomy  . JOINT REPLACEMENT    . Left knee replacement    . Rigth knee replacement with revision  04/2008   Dr. Berenice Primas  . ROTATOR CUFF REPAIR Left 01/2009  . s/p bladder surgury  09/2009   Dr. Terance Hart  . SPINAL CORD STIMULATOR INSERTION N/A 09/11/2018   Procedure: LUMBAR SPINAL CORD STIMULATOR INSERTION;  Surgeon: Clydell Hakim, MD;  Location: [facility];  Service: Neurosurgery;  Laterality: N/A;  LUMBAR SPINAL CORD STIMULATOR INSERTION    reports that she has quit smoking. She has never used smokeless tobacco. She reports that she does not drink alcohol or use drugs. family history includes Breast cancer (age of onset: 13) in an other family member; Breast cancer (age of onset: 72) in her sister; Breast cancer (age of onset: 66) in her sister; Coronary artery disease in an other family member; Diabetes in her brother, brother, and sister; Heart disease in her brother and sister; Hypertension in an other family member. Allergies  Allergen Reactions  .  Ciprofloxacin Shortness Of Breath and Other (See Comments)    Dizziness   . Hydrocodone Shortness Of Breath and Other (See Comments)    Tachycardia  . Penicillins Hives    Pt just remembers hives Has patient had a PCN reaction causing immediate rash, facial/tongue/throat swelling, SOB or lightheadedness with hypotension: No Has patient had a PCN reaction causing severe rash involving mucus membranes or skin necrosis: No Has patient had a PCN reaction that required hospitalization No Has patient had a PCN reaction occurring within the last 10 years: No If all of the above answers are "NO", then may proceed with Cephalosporin use.    Current Outpatient  Medications on File Prior to Visit  Medication Sig Dispense Refill  . aspirin 81 MG EC tablet Take 81 mg by mouth every evening.     Marland Kitchen atorvastatin (LIPITOR) 40 MG tablet TAKE ONE TABLET BY MOUTH EVERY NIGHT AT BEDTIME (Patient taking differently: Take 40 mg by mouth at bedtime. ) 90 tablet 1  . cloNIDine (CATAPRES) 0.1 MG tablet TAKE ONE TABLET BY MOUTH TWICE A DAY (Patient taking differently: Take 0.1 mg by mouth 2 (two) times daily. ) 60 tablet 0  . Continuous Blood Gluc Receiver (FREESTYLE LIBRE 14 DAY READER) DEVI by Does not apply route.    Marland Kitchen ELMIRON 100 MG capsule Take 200 mg by mouth 2 (two) times daily.     Marland Kitchen FLUoxetine (PROZAC) 20 MG capsule TAKE ONE CAPSULE BY MOUTH DAILY (Patient taking differently: Take 20 mg by mouth daily. ) 90 capsule 1  . gabapentin (NEURONTIN) 300 MG capsule Take 300 mg by mouth 3 (three) times daily.     Marland Kitchen gabapentin (NEURONTIN) 600 MG tablet Take 600 mg by mouth 3 (three) times daily.    Marland Kitchen glucose blood (FREESTYLE TEST STRIPS) test strip Use as instructed to check blood sugar 4 times daily. 100 each 3  . insulin glargine (LANTUS SOLOSTAR) 100 UNIT/ML Solostar Pen Inject 20 Units into the skin daily. 15 mL 0  . Insulin Lispro Prot & Lispro (HUMALOG MIX 75/25 KWIKPEN) (75-25) 100 UNIT/ML Kwikpen Inject 20 Units into the skin daily with breakfast. (Patient taking differently: Inject 10 Units into the skin in the morning, at noon, and at bedtime. ) 15 mL 11  . isosorbide mononitrate (IMDUR) 60 MG 24 hr tablet Take 1 tablet (60 mg total) by mouth daily. 30 tablet 0  . levothyroxine (SYNTHROID) 125 MCG tablet Take 1 tablet by mouth daily, Sunday-Friday. On Saturday, take 1.5 tablets. (Patient taking differently: Take 187.5 mcg by mouth daily before breakfast. ) 120 tablet 1  . linaclotide (LINZESS) 72 MCG capsule Take 1 capsule (72 mcg total) by mouth daily before breakfast. 30 capsule 11  . losartan (COZAAR) 100 MG tablet Take one tab daily (Patient taking differently:  Take 100 mg by mouth daily. ) 90 tablet 1  . metoprolol succinate (TOPROL-XL) 50 MG 24 hr tablet TAKE 1 TABLET BY MOUTH DAILY WITH OR IMMEDIATELY FOLLOWING A MEAL (Patient taking differently: Take 50 mg by mouth daily. ) 30 tablet 3  . Na Sulfate-K Sulfate-Mg Sulf (SUPREP BOWEL PREP KIT) 17.5-3.13-1.6 GM/177ML SOLN Take 1 kit by mouth as directed. For colonoscopy prep 354 mL 0  . nitroGLYCERIN (NITROSTAT) 0.4 MG SL tablet Place 1 tablet (0.4 mg total) under the tongue every 5 (five) minutes as needed for chest pain. 20 tablet 0  . ondansetron (ZOFRAN) 4 MG tablet Take 1 tablet (4 mg total) by mouth every 8 (eight)  hours as needed for nausea or vomiting. 30 tablet 1  . pantoprazole (PROTONIX) 40 MG tablet TAKE 1 TABLET(40 MG) BY MOUTH TWICE DAILY (Patient taking differently: Take 40 mg by mouth 2 (two) times daily. ) 60 tablet 3  . polyethylene glycol (MIRALAX / GLYCOLAX) 17 g packet Take 17 g by mouth daily as needed. 14 each 0  . promethazine (PHENERGAN) 25 MG tablet Take 1 tablet (25 mg total) by mouth every 8 (eight) hours as needed for nausea or vomiting. 40 tablet 1  . traMADol (ULTRAM) 50 MG tablet Take 1 tablet (50 mg total) by mouth every 6 (six) hours as needed. 8 tablet 0   No current facility-administered medications on file prior to visit.   Review of Systems All otherwise neg per pt    Objective:   Physical Exam BP 140/80 (BP Location: Left Arm, Patient Position: Sitting, Cuff Size: Large)   Pulse 96   Temp 99.1 F (37.3 C) (Oral)   Ht '5\' 6"'$  (1.676 m)   Wt 295 lb (133.8 kg)   SpO2 96%   BMI 47.61 kg/m  VS noted,  Constitutional: Pt appears in NAD HENT: Head: NCAT.  Right Ear: External ear normal.  Left Ear: External ear normal.  Eyes: . Pupils are equal, round, and reactive to light. Conjunctivae and EOM are normal Nose: without d/c or deformity Neck: Neck supple. Gross normal ROM Cardiovascular: Normal rate and regular rhythm.   Pulmonary/Chest: Effort normal and  breath sounds without rales or wheezing.  Abd:  Soft, NT, ND, + BS, no organomegaly Neurological: Pt is alert. At baseline orientation, motor grossly intact Skin: Skin is warm. No rashes, other new lesions, no LE edema Psychiatric: Pt behavior is normal without agitation  All otherwise neg per pt  Lab Results  Component Value Date   WBC 8.5 01/26/2020   HGB 8.7 (L) 01/26/2020   HCT 28.1 (L) 01/26/2020   PLT 248 01/26/2020   GLUCOSE 177 (H) 01/25/2020   CHOL 175 01/26/2020   TRIG 71 01/26/2020   HDL 56 01/26/2020   LDLDIRECT 87.0 01/25/2020   LDLCALC 105 (H) 01/26/2020   ALT 13 12/27/2019   AST 16 12/27/2019   NA 137 01/25/2020   K 4.7 01/25/2020   CL 106 01/25/2020   CREATININE 1.78 (H) 01/25/2020   BUN 19 01/25/2020   CO2 23 01/25/2020   TSH 0.661 01/26/2020   INR 1.21 08/07/2017   HGBA1C 8.1 (H) 01/26/2020   MICROALBUR <0.7 04/22/2019      Assessment & Plan:

## 2020-02-11 ENCOUNTER — Other Ambulatory Visit: Payer: Self-pay | Admitting: Endocrinology

## 2020-02-11 NOTE — Telephone Encounter (Signed)
Is this ok to refill?  

## 2020-02-14 ENCOUNTER — Encounter: Payer: Self-pay | Admitting: Podiatry

## 2020-02-14 ENCOUNTER — Other Ambulatory Visit: Payer: Self-pay

## 2020-02-14 ENCOUNTER — Ambulatory Visit (INDEPENDENT_AMBULATORY_CARE_PROVIDER_SITE_OTHER): Payer: Medicare Other | Admitting: Podiatry

## 2020-02-14 VITALS — Temp 97.8°F

## 2020-02-14 DIAGNOSIS — T148XXA Other injury of unspecified body region, initial encounter: Secondary | ICD-10-CM

## 2020-02-14 DIAGNOSIS — M76822 Posterior tibial tendinitis, left leg: Secondary | ICD-10-CM | POA: Diagnosis not present

## 2020-02-17 DIAGNOSIS — N183 Chronic kidney disease, stage 3 unspecified: Secondary | ICD-10-CM | POA: Diagnosis not present

## 2020-02-17 NOTE — Progress Notes (Signed)
Subjective:   Patient ID: [name], female   DOB: 69 y.o.   MRN: [id number]   HPI Patient presents stating she wants to go over the results of the ultrasound that was done and states that the pain continues to be persistent and her flatfoot deformity continues to be bothersome with obesity complicating factor   ROS      Objective:  Physical Exam  Neurovascular status intact negative Bevelyn Buckles' sign noted with continued inflammation pain around the posterior tibial tendon as it comes under medial malleolus with significant collapse of the arch noted left with obesity is complicating factor and ultrasound indicated inflammation but no tear currently     Assessment:  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

## 2020-02-21 ENCOUNTER — Other Ambulatory Visit: Payer: Self-pay | Admitting: Internal Medicine

## 2020-02-21 DIAGNOSIS — N189 Chronic kidney disease, unspecified: Secondary | ICD-10-CM | POA: Diagnosis not present

## 2020-02-21 DIAGNOSIS — E1140 Type 2 diabetes mellitus with diabetic neuropathy, unspecified: Secondary | ICD-10-CM | POA: Diagnosis not present

## 2020-02-21 DIAGNOSIS — E119 Type 2 diabetes mellitus without complications: Secondary | ICD-10-CM | POA: Diagnosis not present

## 2020-02-21 DIAGNOSIS — M1990 Unspecified osteoarthritis, unspecified site: Secondary | ICD-10-CM | POA: Diagnosis not present

## 2020-02-21 DIAGNOSIS — E785 Hyperlipidemia, unspecified: Secondary | ICD-10-CM | POA: Diagnosis not present

## 2020-02-21 DIAGNOSIS — I10 Essential (primary) hypertension: Secondary | ICD-10-CM | POA: Diagnosis not present

## 2020-02-21 DIAGNOSIS — I25118 Atherosclerotic heart disease of native coronary artery with other forms of angina pectoris: Secondary | ICD-10-CM | POA: Diagnosis not present

## 2020-02-21 MED ORDER — ATORVASTATIN CALCIUM 40 MG PO TABS: 40.0000 mg | ORAL_TABLET | Freq: Every day | ORAL | 3 refills | Status: DC

## 2020-02-21 MED ORDER — METOPROLOL SUCCINATE ER 50 MG PO TB24: 50.0000 mg | ORAL_TABLET | Freq: Every day | ORAL | 3 refills | Status: DC

## 2020-02-21 MED ORDER — ISOSORBIDE MONONITRATE ER 60 MG PO TB24: 60.0000 mg | ORAL_TABLET | Freq: Every day | ORAL | 3 refills | Status: AC

## 2020-02-21 MED ORDER — LOSARTAN POTASSIUM 100 MG PO TABS: ORAL_TABLET | ORAL | 3 refills | Status: DC

## 2020-02-21 MED ORDER — PANTOPRAZOLE SODIUM 40 MG PO TBEC: DELAYED_RELEASE_TABLET | ORAL | 3 refills | Status: DC

## 2020-02-21 MED ORDER — FLUOXETINE HCL 20 MG PO CAPS: ORAL_CAPSULE | ORAL | 3 refills | Status: DC

## 2020-02-21 MED ORDER — CLONIDINE HCL 0.1 MG PO TABS: 0.1000 mg | ORAL_TABLET | Freq: Two times a day (BID) | ORAL | 3 refills | Status: DC

## 2020-02-22 ENCOUNTER — Telehealth: Payer: Self-pay

## 2020-02-22 ENCOUNTER — Other Ambulatory Visit: Payer: Self-pay

## 2020-02-22 MED ORDER — LEVOTHYROXINE SODIUM 125 MCG PO TABS: ORAL_TABLET | ORAL | 1 refills | Status: DC

## 2020-02-22 MED ORDER — LANTUS SOLOSTAR 100 UNIT/ML ~~LOC~~ SOPN: 20.0000 [IU] | PEN_INJECTOR | Freq: Every day | SUBCUTANEOUS | 0 refills | Status: DC

## 2020-02-22 NOTE — Telephone Encounter (Signed)
1.Medication Requested: cloNIDine (CATAPRES) 0.1 MG tablet  ELMIRON 100 MG capsule  gabapentin (NEURONTIN) 300 MG capsule  gabapentin (NEURONTIN) 600 MG tablet  2. Pharmacy (Name, Street, City):[facility], [place]  3. On Med List: Yes   4. Last Visit with PCP: 4.30.2021   5. Next visit date with PCP: n/a   Agent: Please be advised that RX refills may take up to 3 business days. We ask that you follow-up with your pharmacy.

## 2020-02-23 NOTE — Telephone Encounter (Signed)
No clonidine tab since already done  Elmiron is out of my scope of practice and I have never rx this for this pt; she should consider original rx provider for refills  I also have not been prescibing the gabapentin, and I hesitate to do this, since the provider involved would likely want to know about her needing refillls - I think she may be seeing neurology?

## 2020-02-25 NOTE — Telephone Encounter (Signed)
Spoke with pt informed her if Dr. Judi Cong note and ot understood. Pt has no questions or concerns at this time.

## 2020-02-28 DIAGNOSIS — D631 Anemia in chronic kidney disease: Secondary | ICD-10-CM | POA: Diagnosis not present

## 2020-02-28 DIAGNOSIS — N183 Chronic kidney disease, stage 3 unspecified: Secondary | ICD-10-CM | POA: Diagnosis not present

## 2020-02-28 DIAGNOSIS — I129 Hypertensive chronic kidney disease with stage 1 through stage 4 chronic kidney disease, or unspecified chronic kidney disease: Secondary | ICD-10-CM | POA: Diagnosis not present

## 2020-03-01 ENCOUNTER — Other Ambulatory Visit: Payer: Self-pay | Admitting: Internal Medicine

## 2020-03-01 NOTE — Telephone Encounter (Signed)
Nope - already has pills refilled.  Please ask pharmacy to stop reqeusting this

## 2020-03-03 ENCOUNTER — Other Ambulatory Visit: Payer: Self-pay

## 2020-03-03 ENCOUNTER — Other Ambulatory Visit: Payer: Medicare Other | Admitting: Orthotics

## 2020-03-08 ENCOUNTER — Other Ambulatory Visit: Payer: Self-pay

## 2020-03-08 MED ORDER — LANTUS SOLOSTAR 100 UNIT/ML ~~LOC~~ SOPN: 18.0000 [IU] | PEN_INJECTOR | Freq: Every day | SUBCUTANEOUS | 2 refills | Status: DC

## 2020-03-09 ENCOUNTER — Other Ambulatory Visit: Payer: Self-pay | Admitting: Internal Medicine

## 2020-03-10 NOTE — Telephone Encounter (Signed)
Done erx 

## 2020-03-22 ENCOUNTER — Ambulatory Visit: Payer: Medicare Other | Admitting: Nutrition

## 2020-04-04 ENCOUNTER — Ambulatory Visit: Payer: Medicare Other | Admitting: Orthotics

## 2020-04-04 ENCOUNTER — Other Ambulatory Visit: Payer: Self-pay

## 2020-04-04 DIAGNOSIS — M76822 Posterior tibial tendinitis, left leg: Secondary | ICD-10-CM

## 2020-04-04 DIAGNOSIS — M25572 Pain in left ankle and joints of left foot: Secondary | ICD-10-CM

## 2020-04-04 DIAGNOSIS — T148XXA Other injury of unspecified body region, initial encounter: Secondary | ICD-10-CM

## 2020-04-04 NOTE — Progress Notes (Signed)
Did not dispense brace b/c patient is adamant that her issues is with her RIGHT foot, not LEFT foot; however all Dr. Mellody Drown notes indicates LEFT foot pathologies, BETHA cast her for LEFT foot, and brace was produced for LEFT foot.  She will see Dr. Paulla Dolly tomorrow and hopefully this can get cleared up.

## 2020-04-05 ENCOUNTER — Ambulatory Visit (INDEPENDENT_AMBULATORY_CARE_PROVIDER_SITE_OTHER): Payer: Medicare Other | Admitting: Podiatry

## 2020-04-05 ENCOUNTER — Ambulatory Visit (INDEPENDENT_AMBULATORY_CARE_PROVIDER_SITE_OTHER): Payer: Medicare Other

## 2020-04-05 DIAGNOSIS — S9002XA Contusion of left ankle, initial encounter: Secondary | ICD-10-CM | POA: Diagnosis not present

## 2020-04-05 DIAGNOSIS — M779 Enthesopathy, unspecified: Secondary | ICD-10-CM

## 2020-04-05 MED ORDER — TRAMADOL HCL 50 MG PO TABS: 50.0000 mg | ORAL_TABLET | Freq: Three times a day (TID) | ORAL | 2 refills | Status: DC

## 2020-04-05 NOTE — Progress Notes (Signed)
Subjective:   Patient ID: [name], female   DOB: 69 y.o.   MRN: [id number]   HPI Patient presents stating that she fell on her left ankle and its been somewhat swollen on the outside of it hurts more inside the ankle and she could use more stability.   ROS      Objective:  Physical Exam  Neurovascular status unchanged negative Bevelyn Buckles' sign was noted with edema of a mild nature in the left ankle but the pain is really at this point more in the sinus tarsi deep into the joint.  There is also some swelling posterior but it is localized with no other pathology noted     Assessment:  Patient has significant flatfoot deformity posterior tibial dysfunction with trauma to the left ankle with also the possibility for acute capsulitis left sinus tarsi     Plan:  H&P reviewed all conditions.  At this point I did x-ray reviewed findings concerning ankle injury and I went ahead and I did inject the sinus tarsi left 3 mg dexamethasone 5 mg liken to try to reduce the inflammatory complex and I applied a Tri-Lock ankle brace to try to provide support.  I did explain that ultimately we may need to do more but I do not see signs of current fracture  X-rays indicate there is no indication of diastases injury no indication of fracture associated with the injury to the ankle the patient sustained

## 2020-04-20 DIAGNOSIS — Z9689 Presence of other specified functional implants: Secondary | ICD-10-CM | POA: Diagnosis not present

## 2020-04-20 DIAGNOSIS — M961 Postlaminectomy syndrome, not elsewhere classified: Secondary | ICD-10-CM | POA: Diagnosis not present

## 2020-04-24 ENCOUNTER — Other Ambulatory Visit: Payer: Medicare Other

## 2020-04-26 ENCOUNTER — Encounter: Payer: Self-pay | Admitting: Podiatry

## 2020-04-26 ENCOUNTER — Ambulatory Visit (INDEPENDENT_AMBULATORY_CARE_PROVIDER_SITE_OTHER): Payer: Medicare Other | Admitting: Podiatry

## 2020-04-26 ENCOUNTER — Other Ambulatory Visit: Payer: Self-pay

## 2020-04-26 ENCOUNTER — Other Ambulatory Visit: Payer: Medicare Other | Admitting: Orthotics

## 2020-04-26 DIAGNOSIS — E1165 Type 2 diabetes mellitus with hyperglycemia: Secondary | ICD-10-CM

## 2020-04-26 DIAGNOSIS — M779 Enthesopathy, unspecified: Secondary | ICD-10-CM | POA: Diagnosis not present

## 2020-04-26 DIAGNOSIS — N184 Chronic kidney disease, stage 4 (severe): Secondary | ICD-10-CM

## 2020-04-26 DIAGNOSIS — IMO0002 Reserved for concepts with insufficient information to code with codable children: Secondary | ICD-10-CM

## 2020-04-26 DIAGNOSIS — E0822 Diabetes mellitus due to underlying condition with diabetic chronic kidney disease: Secondary | ICD-10-CM

## 2020-04-26 DIAGNOSIS — M79674 Pain in right toe(s): Secondary | ICD-10-CM

## 2020-04-26 DIAGNOSIS — M76822 Posterior tibial tendinitis, left leg: Secondary | ICD-10-CM | POA: Diagnosis not present

## 2020-04-26 DIAGNOSIS — M76821 Posterior tibial tendinitis, right leg: Secondary | ICD-10-CM

## 2020-04-26 DIAGNOSIS — B351 Tinea unguium: Secondary | ICD-10-CM

## 2020-04-26 DIAGNOSIS — M79675 Pain in left toe(s): Secondary | ICD-10-CM

## 2020-04-26 DIAGNOSIS — Z794 Long term (current) use of insulin: Secondary | ICD-10-CM | POA: Diagnosis not present

## 2020-04-26 DIAGNOSIS — E1122 Type 2 diabetes mellitus with diabetic chronic kidney disease: Secondary | ICD-10-CM

## 2020-04-27 ENCOUNTER — Other Ambulatory Visit: Payer: Self-pay

## 2020-04-27 ENCOUNTER — Encounter: Payer: Self-pay | Admitting: Endocrinology

## 2020-04-27 ENCOUNTER — Ambulatory Visit (INDEPENDENT_AMBULATORY_CARE_PROVIDER_SITE_OTHER): Payer: Medicare Other | Admitting: Endocrinology

## 2020-04-27 VITALS — BP 138/78 | HR 89 | Ht 66.0 in | Wt 294.0 lb

## 2020-04-27 DIAGNOSIS — E1165 Type 2 diabetes mellitus with hyperglycemia: Secondary | ICD-10-CM | POA: Diagnosis not present

## 2020-04-27 DIAGNOSIS — Z794 Long term (current) use of insulin: Secondary | ICD-10-CM

## 2020-04-27 DIAGNOSIS — E890 Postprocedural hypothyroidism: Secondary | ICD-10-CM | POA: Diagnosis not present

## 2020-04-27 DIAGNOSIS — E119 Type 2 diabetes mellitus without complications: Secondary | ICD-10-CM | POA: Diagnosis not present

## 2020-04-27 LAB — POCT GLYCOSYLATED HEMOGLOBIN (HGB A1C): Hemoglobin A1C: 8.3 % — AB (ref 4.0–5.6)

## 2020-04-27 NOTE — Patient Instructions (Signed)
Lantus 10 units before Bfst,   HUMALOG mix 20 IN AM AND 10 AT SUPPER

## 2020-04-27 NOTE — Progress Notes (Signed)
[name] is a 69 y.o. female.    Reason for Appointment: follow-up   History of Present Illness   Diagnosis: Type 2 DIABETES MELITUS, date of diagnosis: 2000  Previous history: She had initially been treated with metformin and Amaryl and subsequently given Victoza which helped overall control and weight loss. In 9/13 because of significant hyperglycemia she was started on basal bolus insulin also. Had required relatively small doses of insulin. Her blood sugar control has been excellent with A1c between 5.3-6.0 although this may be relatively higher than expected. Tends to have relatively higher readings after supper In 6/15 she was told to resume her Victoza and stop her Lantus since she had been gaining weight with Lantus and NovoLog regimen.   Recent history:     INSULIN regimen: Lantus 18-20 units before breakfast, Humalog mix 10 units acs   Non-insulin hypoglycemic drugs: None   A1c is now 8.3  Fructosamine previously 452  Current blood sugar patterns and management:  She has difficulty understanding her instructions for insulin administration  She is not using the Accu-Chek for checking the accuracy of her freestyle libre and only using the [place]  However blood sugar monitoring is still inadequate with incomplete data on most days  HYPERGLYCEMIA is fairly consistent in the afternoons and evenings  Blood sugar appears to be progressively rising as before from early morning until bedtime  She has not taken any insulin in the morning partly because blood sugars are lower in the morning but she was told previously to take this to prevent progressive rise in blood sugars  Today even without any breakfast her blood sugar has gone up from about 200 in the morning and up to 295 now in the office  Her blood sugars are higher than on her last visit  Also likely not taking enough insulin leading to cover her meals with blood sugars in the last 2 nights around 350  late evening; HIGHEST average blood sugar between 6-8 PM  HYPOGLYCEMIA has been intermittent and present mostly during the night hours especially 2-4 AM    Side effects from medications:  none   Glucometer:  Freestyle Libre/Accu-Chek  CGM use % of time  68  2-week average/SD  233  Time in range       21%  % Time Above 180  22  % Time above 250  48  % Time Below 70 9     PRE-MEAL Fasting Lunch Dinner Bedtime Overall  Glucose range:       Averages:  178   295  287    POST-MEAL PC Breakfast PC Lunch PC Dinner  Glucose range:     Averages:  275   303    Previous readings:   PRE-MEAL Fasting Lunch Dinner Bedtime Overall  Glucose range:       Mean/median:  145   167   172   POST-MEAL PC Breakfast PC Lunch PC Dinner  Glucose range:     Mean/median:  217   176    Glycemic patterns summary: Blood sugars show moderate variability at all times with the highest readings early afternoon between 12 noon-4 PM but not consistent As discussed below she has occasional low episodes also  Hyperglycemic episodes have occurred occasionally overnight, periodically midday or early afternoon and a couple of times late evening  Hypoglycemic episodes occurred overnight on 2 nights has also once before 6 PM and another time after 8 PM  Overnight periods: Blood  sugars are quite variable but in the last 4 nights are mostly near normal or slightly low  Preprandial periods: Mealtimes are inconsistent and difficult to identify patterns  Postprandial periods:   Blood sugars are generally going up significantly around midday after her first meal but only occasionally late evening   Previous data:      CGM use % of time  44  2-week average/SD  204, GV 35  Time in range  34     %, was 51  % Time Above 180  33  % Time above 250  29  % Time Below 70 4     PRE-MEAL Fasting Lunch Dinner Bedtime Overall  Glucose range:    224    Averages:  163     204   POST-MEAL PC Breakfast PC Lunch PC  Dinner  Glucose range:     Averages:  213   276   Meals: 1-2 meals per day. Lunch 12 Supper at 5 PM            Physical activity: exercise: Limited, has had fatigue, back and leg pain.            Dietician visit: Most recent: 0/2585            Complications: are: None  Wt Readings from Last 3 Encounters:  04/27/20 (!) 294 lb (133.4 kg)  02/04/20 295 lb (133.8 kg)  01/26/20 284 lb 3.2 oz (128.9 kg)    LABS:  Lab Results  Component Value Date   HGBA1C 8.3 (A) 04/27/2020   HGBA1C 8.1 (H) 01/26/2020   HGBA1C 9.0 (H) 12/27/2019   Lab Results  Component Value Date   MICROALBUR <0.7 04/22/2019   LDLCALC 105 (H) 01/26/2020   CREATININE 1.78 (H) 01/25/2020   Lab Results  Component Value Date   FRUCTOSAMINE 452 (H) 01/25/2020   FRUCTOSAMINE 401 (H) 10/20/2019   FRUCTOSAMINE 369 (H) 09/08/2019     Other active problems: See review of systems   Office Visit on 04/27/2020  Component Date Value Ref Range Status  . Hemoglobin A1C 04/27/2020 8.3* 4.0 - 5.6 % Final    Allergies as of 04/27/2020      Reactions   Ciprofloxacin Shortness Of Breath, Other (See Comments)   Dizziness   Hydrocodone Shortness Of Breath, Other (See Comments)   Tachycardia   Penicillins Hives   Pt just remembers hives Has patient had a PCN reaction causing immediate rash, facial/tongue/throat swelling, SOB or lightheadedness with hypotension: No Has patient had a PCN reaction causing severe rash involving mucus membranes or skin necrosis: No Has patient had a PCN reaction that required hospitalization No Has patient had a PCN reaction occurring within the last 10 years: No If all of the above answers are "NO", then may proceed with Cephalosporin use.      Medication List       Accurate as of April 27, 2020  9:11 PM. If you have any questions, ask your nurse or doctor.        aspirin 81 MG EC tablet Take 81 mg by mouth every evening.   atorvastatin 40 MG tablet Commonly known as: LIPITOR  Take 1 tablet (40 mg total) by mouth at bedtime.   buPROPion 150 MG 24 hr tablet Commonly known as: WELLBUTRIN XL TAKE 1 TABLET BY MOUTH DAILY   chlorhexidine 0.12 % solution Commonly known as: PERIDEX See admin instructions.   cloNIDine 0.1 MG tablet Commonly known as: CATAPRES Take 1 tablet (  0.1 mg total) by mouth 2 (two) times daily.   Elmiron 100 MG capsule Generic drug: pentosan polysulfate Take 200 mg by mouth 2 (two) times daily.   erythromycin 250 MG tablet Commonly known as: E-MYCIN Take 250 mg by mouth 4 (four) times daily.   FLUoxetine 20 MG capsule Commonly known as: PROZAC TAKE ONE CAPSULE BY MOUTH DAILY   FreeStyle Libre 14 Day Reader Kerrin Mo by Does not apply route.   gabapentin 300 MG capsule Commonly known as: NEURONTIN Take 300 mg by mouth 3 (three) times daily.   gabapentin 600 MG tablet Commonly known as: NEURONTIN Take 600 mg by mouth 3 (three) times daily.   glucose blood test strip Commonly known as: FREESTYLE TEST STRIPS Use as instructed to check blood sugar 4 times daily.   HYDROcodone-acetaminophen 5-325 MG tablet Commonly known as: NORCO/VICODIN Take 1 tablet by mouth 4 (four) times daily.   Insulin Lispro Prot & Lispro (75-25) 100 UNIT/ML Kwikpen Commonly known as: HumaLOG Mix 75/25 KwikPen Inject 20 Units into the skin daily with breakfast. What changed:   how much to take  when to take this  additional instructions   isosorbide mononitrate 60 MG 24 hr tablet Commonly known as: IMDUR Take 1 tablet (60 mg total) by mouth daily.   Lantus SoloStar 100 UNIT/ML Solostar Pen Generic drug: insulin glargine Inject 18 Units into the skin daily.   levothyroxine 125 MCG tablet Commonly known as: SYNTHROID Take 1 tablet by mouth daily, Sunday-Friday. On Saturday, take 1.5 tablets.   linaclotide 72 MCG capsule Commonly known as: Linzess Take 1 capsule (72 mcg total) by mouth daily before breakfast.   losartan 100 MG tablet  Commonly known as: COZAAR Take one tab daily   metoprolol succinate 50 MG 24 hr tablet Commonly known as: TOPROL-XL Take 1 tablet (50 mg total) by mouth daily. Take with or immediately following a meal.   nitroGLYCERIN 0.4 MG SL tablet Commonly known as: NITROSTAT Place 1 tablet (0.4 mg total) under the tongue every 5 (five) minutes as needed for chest pain.   ondansetron 4 MG tablet Commonly known as: Zofran Take 1 tablet (4 mg total) by mouth every 8 (eight) hours as needed for nausea or vomiting.   pantoprazole 40 MG tablet Commonly known as: PROTONIX TAKE 1 TABLET(40 MG) BY MOUTH TWICE DAILY   phentermine 37.5 MG capsule Take 1 capsule (37.5 mg total) by mouth every morning.   polyethylene glycol 17 g packet Commonly known as: MIRALAX / GLYCOLAX Take 17 g by mouth daily as needed.   tamsulosin 0.4 MG Caps capsule Commonly known as: FLOMAX Take 0.4 mg by mouth daily.   traMADol 50 MG tablet Commonly known as: ULTRAM Take 1 tablet (50 mg total) by mouth every 6 (six) hours as needed. What changed: Another medication with the same name was removed. Continue taking this medication, and follow the directions you see here. Changed by: Elayne Snare, MD       Allergies:  Allergies  Allergen Reactions  . Ciprofloxacin Shortness Of Breath and Other (See Comments)    Dizziness   . Hydrocodone Shortness Of Breath and Other (See Comments)    Tachycardia  . Penicillins Hives    Pt just remembers hives Has patient had a PCN reaction causing immediate rash, facial/tongue/throat swelling, SOB or lightheadedness with hypotension: No Has patient had a PCN reaction causing severe rash involving mucus membranes or skin necrosis: No Has patient had a PCN reaction that required hospitalization No  Has patient had a PCN reaction occurring within the last 10 years: No If all of the above answers are "NO", then may proceed with Cephalosporin use.     Past Medical History:  Diagnosis  Date  . Anxiety   . Arthritis   . Blood dyscrasia    "FREE BLEEDER"  . CERVICAL RADICULOPATHY, LEFT   . Chronic back pain   . CKD (chronic kidney disease)    DR. SANF[facility]  . COMMON MIGRAINE   . CORONARY ARTERY DISEASE   . Cough    CURRENT COLD  . Cystitis   . DEPRESSION   . Diabetes mellitus, type II ([place])   . DIVERTICULOSIS-COLON   . Dysrhythmia    palpitations  . Gastric ulcer 04/2008  . Gastroparesis   . GERD (gastroesophageal reflux disease)   . Hiatal hernia   . Hyperlipidemia   . Hypertension   . Hypothyroidism   . INSOMNIA-SLEEP DISORDER-UNSPEC   . Iron deficiency anemia   . Wears glasses     Past Surgical History:  Procedure Laterality Date  . ABDOMINAL HYSTERECTOMY    . ANTERIOR LAT LUMBAR FUSION Left 08/13/2017   Procedure: LEFT SIDED LUMBAR 3-4 LATERAL INTERBODY FUSION WITH INSTRUMENTATION AND ALLOGRAFT;  Surgeon: Phylliss Bob, MD;  Location: [facility];  Service: Orthopedics;  Laterality: Left;  LEFT SIDED LUMBAR 3-4 LATERAL INTERBODY FUSION WITH INSTRUMENTATION AND ALLOGRAFT; REQUEST 3 HOURS  . Back Stimulator  07/2018  . BACK SURGERY  03/2016  . BREAST EXCISIONAL BIOPSY Right    40 years ago  . CHOLECYSTECTOMY  06/2009  . COLONOSCOPY    . ESOPHAGOGASTRODUODENOSCOPY    . EYE SURGERY Bilateral    lasik  . Gastric Wedge resection lipoma  11/2007   x2 with laparotomy and gastrostomy  . JOINT REPLACEMENT    . Left knee replacement    . Rigth knee replacement with revision  04/2008   Dr. Berenice Primas  . ROTATOR CUFF REPAIR Left 01/2009  . s/p bladder surgury  09/2009   Dr. Terance Hart  . SPINAL CORD STIMULATOR INSERTION N/A 09/11/2018   Procedure: LUMBAR SPINAL CORD STIMULATOR INSERTION;  Surgeon: Clydell Hakim, MD;  Location: [facility];  Service: Neurosurgery;  Laterality: N/A;  LUMBAR SPINAL CORD STIMULATOR INSERTION    Family History  Problem Relation Age of Onset  . Diabetes Sister        x 3  . Heart disease Sister        x2  . Breast cancer  Sister 55  . Diabetes Brother        x3  . Heart disease Brother        x2  . Coronary artery disease Other        female 1st degree  . Hypertension Other   . Breast cancer Sister 21  . Breast cancer Other 25  . Diabetes Brother   . Colon cancer Neg Hx     Social History:  reports that she has quit smoking. She has never used smokeless tobacco. She reports that she does not drink alcohol and does not use drugs.  Review of Systems:  Hypertension: Her blood pressure is treated with losartan 100 mg and clonidine 0.1 mg tablets, also on metoprolol  Followed by nephrologist and PCP  BP Readings from Last 3 Encounters:  04/27/20 (!) 138/78  02/04/20 140/80  01/27/20 134/77     CKD: Her creatinine is variable, followed by nephrologist Creatinine is higher as of 3/22  Lab Results  Component Value Date   CREATININE 1.78 (H) 01/25/2020   CREATININE 1.67 (H) 01/25/2020   CREATININE 1.95 (H) 12/27/2019   CREATININE 1.52 (H) 12/12/2019   Lab Results  Component Value Date   K 4.7 01/25/2020     Lipids: Followed by PCP, LDL has been variably controlled  Taking Lipitor 40 mg  Her lipids are much worse, does also have significant hypothyroidism on this visit  Lab Results  Component Value Date   CHOL 175 01/26/2020   HDL 56 01/26/2020   LDLCALC 105 (H) 01/26/2020   LDLDIRECT 87.0 01/25/2020   TRIG 71 01/26/2020   CHOLHDL 3.1 01/26/2020     Post ablative hypothyroidism:  Previously she had I-131 treatment on 08/20/11 for Graves' disease  Her dose has been adjusted periodically  She has been taking 125 mg levothyroxine prescription consistently and has been taking 1-1/2 on Saturdays and 1 pill on the other days   TSH was low normal previously and sugars told to reduce her regimen by half tablet weekly However for some reason her TSH was much higher in March and she was significantly symptomatic with fatigue Also was having some vomiting at that time  Now she is  taking 1-1/2 tablets daily of the 125 mcg dose and has taken this consistently the mornings  Lab Results  Component Value Date   TSH 0.661 01/26/2020   TSH 0.42 01/25/2020   TSH 53.70 (H) 12/27/2019   FREET4 0.81 09/08/2019   FREET4 0.95 06/21/2019   FREET4 0.86 04/22/2019    On Gabapentin 300 mg for neuropathy    Examination:   BP (!) 138/78 (BP Location: Left Arm, Patient Position: Sitting, Cuff Size: Large)   Pulse 89   Ht 5\' 6"  (1.676 m)   Wt (!) 294 lb (133.4 kg)   SpO2 96%   BMI 47.45 kg/m   Body mass index is 47.45 kg/m.   No ankle edema evident   ASSESSMENT/ PLAN:   Diabetes type 2,  with obesity:   See history of present illness for detailed discussion of current management, blood sugar patterns and problems identified  A1C is recently 9%  Even though she had appeared to require a lot of insulin on her last visit and she was recommended a total of 90 units daily her blood sugars are much lower in the last few days This is despite taking only a total of 40 units of insulin a day May be benefiting from using premixed insulin instead of only basal and bolus  Her blood sugars still tend to be highest midday and afternoon but this is partly related to her not taking her premixed insulin before eating Fasting readings are still fairly good with 20 units of Lantus despite not using [place] She may have had some nausea with [place] which is resolving  Recommendations for diabetes:  She was advised to check blood sugars consistently at all different time Emphasized the need to take her premixed insulin well before starting to eat and she needs to tell her husband to remind her to do so; also advised her that late administration of her premixed insulin may cause nocturnal hypoglycemia She will also need to reduce Lantus to 18 units to prevent overnight hypoglycemia Blood sugar targets were discussed She will try to avoid high fat or high carbohydrate meals   Hypothyroidism: Taking 125 mcg, 1-1/2 tablets daily because of very high TSH Not clear if she was having some vomiting problem gastroparesis causing inadequate absorption Labs need  to be checked again  Hypertension/renal insufficiency: Losartan was stopped and needs follow-up labs Blood pressure is not as low    Patient Instructions   Lantus 10 units before Bfst,   HUMALOG mix 20 IN AM AND 10 AT SUPPER         Elayne Snare 04/27/2020, 9:11 PM   Note: This office note was prepared with Dragon voice recognition system technology. Any transcriptional errors that result from this process are unintentional.                                           [name] is a 69 y.o. female.    Reason for Appointment: follow-up   History of Present Illness   Diagnosis: Type 2 DIABETES MELITUS, date of diagnosis: 2000  Previous history: She had initially been treated with metformin and Amaryl and subsequently given Victoza which helped overall control and weight loss. In 9/13 because of significant hyperglycemia she was started on basal bolus insulin also. Had required relatively small doses of insulin. Her blood sugar control has been excellent with A1c between 5.3-6.0 although this may be relatively higher than expected. Tends to have relatively higher readings after supper In 6/15 she was told to resume her Victoza and stop her Lantus since she had been gaining weight with Lantus and NovoLog regimen.   Recent history:     INSULIN regimen: Lantus 20 units before Bfst, Novolin mix 10 bid   Non-insulin hypoglycemic drugs: None   Her A1c was last 9% No recent fructosamine available  Current blood sugar patterns and management:  She has had much more variable blood sugars over the last month  She had been asked to increase her basal insulin to 30 units but more recently she started getting low and is now taking 20 units  Previously had a sample of  [place] and now starting Lantus in the morning  She was also recommended Humalog mix insulin but because of cost is now taking OTC Novolin 70/30  She does not always take her premixed insulin before meals causing some significantly high readings after her first meal and occasionally in the evening  Last night after taking her premixed insulin 2 hours after eating her blood sugar got low in the night  However she is checking her blood sugars much more consistently than before with her freestyle libre  Not clear if this is accurate since she does not correlate this with fingersticks    Side effects from medications:  none   Glucometer:  Freestyle Libre/Accu-Chek   CONTINUOUS GLUCOSE MONITORING RECORD INTERPRETATION    Dates of Recording: 4/7 through 4/20  Sensor description: [name]  Results statistics:   CGM use % of time  88  Average and SD  172 GV 45  Time in range    50    %  % Time Above 180  23  % Time above 250  17  % Time Below target  10    PRE-MEAL Fasting Lunch Dinner Bedtime Overall  Glucose range:       Mean/median:  145   167   172   POST-MEAL PC Breakfast PC Lunch PC Dinner  Glucose range:     Mean/median:  217   176    Glycemic patterns summary: Blood sugars show moderate variability at all times with the highest readings early afternoon between 12  noon-4 PM but not consistent As discussed below she has occasional low episodes also  Hyperglycemic episodes have occurred occasionally overnight, periodically midday or early afternoon and a couple of times late evening  Hypoglycemic episodes occurred overnight on 2 nights has also once before 6 PM and another time after 8 PM  Overnight periods: Blood sugars are quite variable but in the last 4 nights are mostly near normal or slightly low  Preprandial periods: Mealtimes are inconsistent and difficult to identify patterns  Postprandial periods:   Blood sugars are generally going up significantly around  midday after her first meal but only occasionally late evening   Previous data:      CGM use % of time  44  2-week average/SD  204, GV 35  Time in range  34     %, was 51  % Time Above 180  33  % Time above 250  29  % Time Below 70 4     PRE-MEAL Fasting Lunch Dinner Bedtime Overall  Glucose range:    224    Averages:  163     204   POST-MEAL PC Breakfast PC Lunch PC Dinner  Glucose range:     Averages:  213   276   Meals: 1-2 meals per day. Lunch 12 Supper at 5 PM            Physical activity: exercise: Limited, has had fatigue, back and leg pain.            Dietician visit: Most recent: 04/6545            Complications: are: None  Wt Readings from Last 3 Encounters:  04/27/20 (!) 294 lb (133.4 kg)  02/04/20 295 lb (133.8 kg)  01/26/20 284 lb 3.2 oz (128.9 kg)    LABS:  Lab Results  Component Value Date   HGBA1C 8.3 (A) 04/27/2020   HGBA1C 8.1 (H) 01/26/2020   HGBA1C 9.0 (H) 12/27/2019   Lab Results  Component Value Date   MICROALBUR <0.7 04/22/2019   LDLCALC 105 (H) 01/26/2020   CREATININE 1.78 (H) 01/25/2020   Lab Results  Component Value Date   FRUCTOSAMINE 452 (H) 01/25/2020   FRUCTOSAMINE 401 (H) 10/20/2019   FRUCTOSAMINE 369 (H) 09/08/2019     Other active problems: See review of systems   Office Visit on 04/27/2020  Component Date Value Ref Range Status  . Hemoglobin A1C 04/27/2020 8.3* 4.0 - 5.6 % Final    Allergies as of 04/27/2020      Reactions   Ciprofloxacin Shortness Of Breath, Other (See Comments)   Dizziness   Hydrocodone Shortness Of Breath, Other (See Comments)   Tachycardia   Penicillins Hives   Pt just remembers hives Has patient had a PCN reaction causing immediate rash, facial/tongue/throat swelling, SOB or lightheadedness with hypotension: No Has patient had a PCN reaction causing severe rash involving mucus membranes or skin necrosis: No Has patient had a PCN reaction that required hospitalization No Has patient had a  PCN reaction occurring within the last 10 years: No If all of the above answers are "NO", then may proceed with Cephalosporin use.      Medication List       Accurate as of April 27, 2020  9:11 PM. If you have any questions, ask your nurse or doctor.        aspirin 81 MG EC tablet Take 81 mg by mouth every evening.   atorvastatin 40 MG tablet Commonly known  as: LIPITOR Take 1 tablet (40 mg total) by mouth at bedtime.   buPROPion 150 MG 24 hr tablet Commonly known as: WELLBUTRIN XL TAKE 1 TABLET BY MOUTH DAILY   chlorhexidine 0.12 % solution Commonly known as: PERIDEX See admin instructions.   cloNIDine 0.1 MG tablet Commonly known as: CATAPRES Take 1 tablet (0.1 mg total) by mouth 2 (two) times daily.   Elmiron 100 MG capsule Generic drug: pentosan polysulfate Take 200 mg by mouth 2 (two) times daily.   erythromycin 250 MG tablet Commonly known as: E-MYCIN Take 250 mg by mouth 4 (four) times daily.   FLUoxetine 20 MG capsule Commonly known as: PROZAC TAKE ONE CAPSULE BY MOUTH DAILY   FreeStyle Libre 14 Day Reader Kerrin Mo by Does not apply route.   gabapentin 300 MG capsule Commonly known as: NEURONTIN Take 300 mg by mouth 3 (three) times daily.   gabapentin 600 MG tablet Commonly known as: NEURONTIN Take 600 mg by mouth 3 (three) times daily.   glucose blood test strip Commonly known as: FREESTYLE TEST STRIPS Use as instructed to check blood sugar 4 times daily.   HYDROcodone-acetaminophen 5-325 MG tablet Commonly known as: NORCO/VICODIN Take 1 tablet by mouth 4 (four) times daily.   Insulin Lispro Prot & Lispro (75-25) 100 UNIT/ML Kwikpen Commonly known as: HumaLOG Mix 75/25 KwikPen Inject 20 Units into the skin daily with breakfast. What changed:   how much to take  when to take this  additional instructions   isosorbide mononitrate 60 MG 24 hr tablet Commonly known as: IMDUR Take 1 tablet (60 mg total) by mouth daily.   Lantus SoloStar 100  UNIT/ML Solostar Pen Generic drug: insulin glargine Inject 18 Units into the skin daily.   levothyroxine 125 MCG tablet Commonly known as: SYNTHROID Take 1 tablet by mouth daily, Sunday-Friday. On Saturday, take 1.5 tablets.   linaclotide 72 MCG capsule Commonly known as: Linzess Take 1 capsule (72 mcg total) by mouth daily before breakfast.   losartan 100 MG tablet Commonly known as: COZAAR Take one tab daily   metoprolol succinate 50 MG 24 hr tablet Commonly known as: TOPROL-XL Take 1 tablet (50 mg total) by mouth daily. Take with or immediately following a meal.   nitroGLYCERIN 0.4 MG SL tablet Commonly known as: NITROSTAT Place 1 tablet (0.4 mg total) under the tongue every 5 (five) minutes as needed for chest pain.   ondansetron 4 MG tablet Commonly known as: Zofran Take 1 tablet (4 mg total) by mouth every 8 (eight) hours as needed for nausea or vomiting.   pantoprazole 40 MG tablet Commonly known as: PROTONIX TAKE 1 TABLET(40 MG) BY MOUTH TWICE DAILY   phentermine 37.5 MG capsule Take 1 capsule (37.5 mg total) by mouth every morning.   polyethylene glycol 17 g packet Commonly known as: MIRALAX / GLYCOLAX Take 17 g by mouth daily as needed.   tamsulosin 0.4 MG Caps capsule Commonly known as: FLOMAX Take 0.4 mg by mouth daily.   traMADol 50 MG tablet Commonly known as: ULTRAM Take 1 tablet (50 mg total) by mouth every 6 (six) hours as needed. What changed: Another medication with the same name was removed. Continue taking this medication, and follow the directions you see here. Changed by: Elayne Snare, MD       Allergies:  Allergies  Allergen Reactions  . Ciprofloxacin Shortness Of Breath and Other (See Comments)    Dizziness   . Hydrocodone Shortness Of Breath and Other (See Comments)  Tachycardia  . Penicillins Hives    Pt just remembers hives Has patient had a PCN reaction causing immediate rash, facial/tongue/throat swelling, SOB or  lightheadedness with hypotension: No Has patient had a PCN reaction causing severe rash involving mucus membranes or skin necrosis: No Has patient had a PCN reaction that required hospitalization No Has patient had a PCN reaction occurring within the last 10 years: No If all of the above answers are "NO", then may proceed with Cephalosporin use.     Past Medical History:  Diagnosis Date  . Anxiety   . Arthritis   . Blood dyscrasia    "FREE BLEEDER"  . CERVICAL RADICULOPATHY, LEFT   . Chronic back pain   . CKD (chronic kidney disease)    DR. SANFORD  [facility]  . COMMON MIGRAINE   . CORONARY ARTERY DISEASE   . Cough    CURRENT COLD  . Cystitis   . DEPRESSION   . Diabetes mellitus, type II ([place])   . DIVERTICULOSIS-COLON   . Dysrhythmia    palpitations  . Gastric ulcer 04/2008  . Gastroparesis   . GERD (gastroesophageal reflux disease)   . Hiatal hernia   . Hyperlipidemia   . Hypertension   . Hypothyroidism   . INSOMNIA-SLEEP DISORDER-UNSPEC   . Iron deficiency anemia   . Wears glasses     Past Surgical History:  Procedure Laterality Date  . ABDOMINAL HYSTERECTOMY    . ANTERIOR LAT LUMBAR FUSION Left 08/13/2017   Procedure: LEFT SIDED LUMBAR 3-4 LATERAL INTERBODY FUSION WITH INSTRUMENTATION AND ALLOGRAFT;  Surgeon: Phylliss Bob, MD;  Location: [facility];  Service: Orthopedics;  Laterality: Left;  LEFT SIDED LUMBAR 3-4 LATERAL INTERBODY FUSION WITH INSTRUMENTATION AND ALLOGRAFT; REQUEST 3 HOURS  . Back Stimulator  07/2018  . BACK SURGERY  03/2016  . BREAST EXCISIONAL BIOPSY Right    40 years ago  . CHOLECYSTECTOMY  06/2009  . COLONOSCOPY    . ESOPHAGOGASTRODUODENOSCOPY    . EYE SURGERY Bilateral    lasik  . Gastric Wedge resection lipoma  11/2007   x2 with laparotomy and gastrostomy  . JOINT REPLACEMENT    . Left knee replacement    . Rigth knee replacement with revision  04/2008   Dr. Berenice Primas  . ROTATOR CUFF REPAIR Left 01/2009  . s/p bladder surgury  09/2009    Dr. Terance Hart  . SPINAL CORD STIMULATOR INSERTION N/A 09/11/2018   Procedure: LUMBAR SPINAL CORD STIMULATOR INSERTION;  Surgeon: Clydell Hakim, MD;  Location: [facility];  Service: Neurosurgery;  Laterality: N/A;  LUMBAR SPINAL CORD STIMULATOR INSERTION    Family History  Problem Relation Age of Onset  . Diabetes Sister        x 3  . Heart disease Sister        x2  . Breast cancer Sister 69  . Diabetes Brother        x3  . Heart disease Brother        x2  . Coronary artery disease Other        female 1st degree  . Hypertension Other   . Breast cancer Sister 82  . Breast cancer Other 25  . Diabetes Brother   . Colon cancer Neg Hx     Social History:  reports that she has quit smoking. She has never used smokeless tobacco. She reports that she does not drink alcohol and does not use drugs.  Review of Systems:  Hypertension: Her blood pressure is treated with  losartan 100 mg and clonidine 0.1 mg tablets, also on metoprolol  Followed by nephrologist and PCP  BP Readings from Last 3 Encounters:  04/27/20 (!) 138/78  02/04/20 140/80  01/27/20 134/77     CKD: Her creatinine is variable, followed by nephrologist Creatinine is higher as of 3/22  Lab Results  Component Value Date   CREATININE 1.78 (H) 01/25/2020   CREATININE 1.67 (H) 01/25/2020   CREATININE 1.95 (H) 12/27/2019   CREATININE 1.52 (H) 12/12/2019   Lab Results  Component Value Date   K 4.7 01/25/2020     Lipids: Followed by PCP, LDL has been variably controlled  Taking Lipitor 40 mg  Lipids are variable with no consistent control  Lab Results  Component Value Date   CHOL 175 01/26/2020   HDL 56 01/26/2020   LDLCALC 105 (H) 01/26/2020   LDLDIRECT 87.0 01/25/2020   TRIG 71 01/26/2020   CHOLHDL 3.1 01/26/2020     Post ablative hypothyroidism:  Previously she had I-131 treatment on 08/20/11 for Graves' disease  Her dose has been adjusted periodically  She has been taking 125 mg levothyroxine  prescription consistently    TSH has fluctuated considerably this year and may be related to her not taking her doses as directed  On the last visit she stated that she was taking 1-1/2 tablets on 5 days a week of the 125 mcg dose and 1 tablet on weekends now she is saying that she is doing the opposite with 1 tablet daily except 1-1/2 on weekends   Lab Results  Component Value Date   TSH 0.661 01/26/2020   TSH 0.42 01/25/2020   TSH 53.70 (H) 12/27/2019   FREET4 0.81 09/08/2019   FREET4 0.95 06/21/2019   FREET4 0.86 04/22/2019    On Gabapentin 300 mg for neuropathy  Asking about pain in the right upper abdomen, not acute and has not discussed with PCP.  Has history of cholecystectomy  She has been followed by podiatrist for ankle pain   Examination:   BP (!) 138/78 (BP Location: Left Arm, Patient Position: Sitting, Cuff Size: Large)   Pulse 89   Ht 5\' 6"  (1.676 m)   Wt (!) 294 lb (133.4 kg)   SpO2 96%   BMI 47.45 kg/m   Body mass index is 47.45 kg/m.      ASSESSMENT/ PLAN:   Diabetes type 2,  with obesity:   See history of present illness for detailed discussion of current management, blood sugar patterns and problems identified  A1C is only slightly better at 8.3  Even though she has been given explicit instructions on her insulin regimen if she is not following them consistently She is getting nocturnal hypoglycemia as a result of taking Lantus and primarily has a problem with rising blood sugars during the day with or without meals Blood sugars are averaging 233 but are above 250 about 48% of the time that the data is available on her [name] With her blood sugars significantly higher she is now fairly insulin deficient Previously had nausea with [place]  Recommendations for diabetes:  She was reminded to check blood sugars at least 3-4 times a day to get more complete information  Again written instructions given for her insulin doses  Advised to take her  morning insulin regardless of premeal blood sugar on waking up  For now will take 20 units in the morning of the Humalog mix and 10 at suppertime, she can adjust her evening dose based on meal  size  We will reduce her Lantus from 18 down to 10 units in the morning  Hypothyroidism: She has difficulty understanding her instructions for how to take her medication We will check her labs today and decide on the regimen, currently she reports taking 1 tablet of the 125 mcg 5 days a week and 1-1/2 on the weekends giving her an average dose of 142 mcg daily  Abdominal discomfort: She will need to discuss with her PCP    Patient Instructions   Lantus 10 units before Bfst,   HUMALOG mix 20 IN AM AND 10 AT SUPPER         Elayne Snare 04/27/2020, 9:11 PM   Note: This office note was prepared with Dragon voice recognition system technology. Any transcriptional errors that result from this process are unintentional.

## 2020-04-28 LAB — COMPREHENSIVE METABOLIC PANEL
ALT: 26 U/L (ref 0–35)
AST: 24 U/L (ref 0–37)
Albumin: 4 g/dL (ref 3.5–5.2)
Alkaline Phosphatase: 103 U/L (ref 39–117)
BUN: 24 mg/dL — ABNORMAL HIGH (ref 6–23)
CO2: 26 mEq/L (ref 19–32)
Calcium: 10.2 mg/dL (ref 8.4–10.5)
Chloride: 102 mEq/L (ref 96–112)
Creatinine, Ser: 1.48 mg/dL — ABNORMAL HIGH (ref 0.40–1.20)
GFR: 42.17 mL/min — ABNORMAL LOW (ref >60.00)
Glucose, Bld: 299 mg/dL — ABNORMAL HIGH (ref 70–99)
Potassium: 4.4 mEq/L (ref 3.5–5.1)
Sodium: 137 mEq/L (ref 135–145)
Total Bilirubin: 0.7 mg/dL (ref 0.2–1.2)
Total Protein: 7.6 g/dL (ref 6.0–8.3)

## 2020-04-28 LAB — TSH: TSH: 0.72 u[IU]/mL (ref 0.35–4.50)

## 2020-04-29 ENCOUNTER — Other Ambulatory Visit: Payer: Self-pay | Admitting: Endocrinology

## 2020-04-30 NOTE — Progress Notes (Signed)
Please call to let patient know that the thyroid results are normal, kidney and is slightly better.  Need to know exactly how she is taking her levothyroxine 125 mcg doses

## 2020-04-30 NOTE — Progress Notes (Signed)
Subjective:  Patient ID: [name], female    DOB: 1950-12-16,  MRN: [id number]  69 y.o. female presents with at risk foot care. Pt has h/o NIDDM with chronic kidney disease and painful thick toenails that are difficult to trim. Pain interferes with ambulation. Aggravating factors include wearing enclosed shoe gear. Pain is relieved with periodic professional debridement.   She will see Pedorthist today for casting for bracing of RLE for posterior tibial tendon dysfunction.  PCP is Dr. Biagio Borg, M.D.. Last visit was 02/04/2020.  Review of Systems: Negative except as noted in the HPI. Denies N/V/F/Ch. Past Medical History:  Diagnosis Date  . Anxiety   . Arthritis   . Blood dyscrasia    "FREE BLEEDER"  . CERVICAL RADICULOPATHY, LEFT   . Chronic back pain   . CKD (chronic kidney disease)    DR. SANFORD  [facility]  . COMMON MIGRAINE   . CORONARY ARTERY DISEASE   . Cough    CURRENT COLD  . Cystitis   . DEPRESSION   . Diabetes mellitus, type II ([place])   . DIVERTICULOSIS-COLON   . Dysrhythmia    palpitations  . Gastric ulcer 04/2008  . Gastroparesis   . GERD (gastroesophageal reflux disease)   . Hiatal hernia   . Hyperlipidemia   . Hypertension   . Hypothyroidism   . INSOMNIA-SLEEP DISORDER-UNSPEC   . Iron deficiency anemia   . Wears glasses    Past Surgical History:  Procedure Laterality Date  . ABDOMINAL HYSTERECTOMY    . ANTERIOR LAT LUMBAR FUSION Left 08/13/2017   Procedure: LEFT SIDED LUMBAR 3-4 LATERAL INTERBODY FUSION WITH INSTRUMENTATION AND ALLOGRAFT;  Surgeon: Phylliss Bob, MD;  Location: [facility];  Service: Orthopedics;  Laterality: Left;  LEFT SIDED LUMBAR 3-4 LATERAL INTERBODY FUSION WITH INSTRUMENTATION AND ALLOGRAFT; REQUEST 3 HOURS  . Back Stimulator  07/2018  . BACK SURGERY  03/2016  . BREAST EXCISIONAL BIOPSY Right    40 years ago  . CHOLECYSTECTOMY  06/2009  . COLONOSCOPY    . ESOPHAGOGASTRODUODENOSCOPY    . EYE SURGERY Bilateral    lasik   . Gastric Wedge resection lipoma  11/2007   x2 with laparotomy and gastrostomy  . JOINT REPLACEMENT    . Left knee replacement    . Rigth knee replacement with revision  04/2008   Dr. Berenice Primas  . ROTATOR CUFF REPAIR Left 01/2009  . s/p bladder surgury  09/2009   Dr. Terance Hart  . SPINAL CORD STIMULATOR INSERTION N/A 09/11/2018   Procedure: LUMBAR SPINAL CORD STIMULATOR INSERTION;  Surgeon: Clydell Hakim, MD;  Location: [facility];  Service: Neurosurgery;  Laterality: N/A;  LUMBAR SPINAL CORD STIMULATOR INSERTION   Patient Active Problem List   Diagnosis Date Noted  . Low grade fever 02/04/2020  . Chest pain 01/26/2020  . Rectal bleeding 09/01/2019  . History of peptic ulcer disease 09/01/2019  . Screening for viral disease 09/01/2019  . Insulin dependent type 2 diabetes mellitus ([place]) 09/01/2019  . Pyelonephritis 06/05/2019  . Recurrent UTI 06/05/2019  . Urinary incontinence 06/05/2019  . Nausea & vomiting 02/08/2019  . Abdominal pain 02/08/2019  . Constipation 02/08/2019  . UTI (urinary tract infection) 02/08/2019  . LUQ pain 01/21/2019  . Cervical radiculopathy at C6 01/28/2018  . Left lateral epicondylitis 01/23/2018  . Obesity 01/23/2018  . Weakness 05/01/2017  . Hypothyroidism 05/01/2017  . Hyperkalemia 05/01/2017  . Acute renal failure superimposed on stage 4 chronic kidney disease ([place]) 05/01/2017  . Hypotension 05/01/2017  .  Sepsis ([place]) 05/01/2017  . Dysuria 04/23/2017  . Right leg swelling 12/27/2016  . Leg pain, lateral, right 11/05/2016  . Radiculopathy 04/03/2016  . Hypersomnolence 11/23/2015  . Right sided abdominal pain 07/25/2015  . Malaise and fatigue 11/05/2014  . Fracture of fifth metacarpal bone of right hand 07/08/2014  . Right cervical radiculopathy 06/27/2014  . Chronic interstitial cystitis 05/23/2014  . Osteoarthritis of [place] joint of thumb 04/11/2014  . De Quervain's tenosynovitis, left 03/14/2014  . CKD (chronic kidney disease) stage 4, GFR 15-29 ml/min  ([facility]) 03/03/2014  . Lower back pain 03/03/2014  . Ingrown nail 03/03/2014  . Peripheral edema 02/23/2014  . Lateral epicondylitis of right elbow 01/31/2014  . Neck pain on left side 11/19/2013  . Abdominal discomfort 10/12/2012  . Right lumbar radiculopathy 05/11/2012  . Peripheral neuropathy 05/11/2012  . Vertigo 03/29/2012  . Headache(784.0) 03/10/2012  . Lumbar radiculopathy 10/24/2011  . 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 Right leg weakness 04/04/2011  . Hematochezia 01/07/2011  . Preventative health care 01/07/2011  . CHOLELITHIASIS 05/04/2009  . GERD 04/26/2009  . Gastroparesis 04/26/2009  . ULCER-GASTRIC 03/15/2009  . DIVERTICULOSIS-COLON 03/15/2009  . Cervical radiculopathy 10/28/2008  . MENOPAUSAL DISORDER 06/29/2008  . Pain in joint, shoulder region 10/12/2007  . INSOMNIA-SLEEP DISORDER-UNSPEC 09/01/2007  . Depression 09/01/2007  . ANEMIA-NOS 08/13/2007  . COMMON MIGRAINE 08/13/2007  . 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 Diabetes ([place]) 05/06/2007  . HLD (hyperlipidemia) 05/06/2007  . ANXIETY 05/06/2007  . Essential hypertension 05/06/2007  . Coronary atherosclerosis 05/06/2007    Current Outpatient Medications:  .  aspirin 81 MG EC tablet, Take 81 mg by mouth every evening. , Disp: , Rfl:  .  atorvastatin (LIPITOR) 40 MG tablet, Take 1 tablet (40 mg total) by mouth at bedtime., Disp: 90 tablet, Rfl: 3 .  buPROPion (WELLBUTRIN XL) 150 MG 24 hr tablet, TAKE 1 TABLET BY MOUTH DAILY, Disp: 90 tablet, Rfl: 3 .  chlorhexidine (PERIDEX) 0.12 % solution, See admin instructions., Disp: , Rfl:  .  cloNIDine (CATAPRES) 0.1 MG tablet, Take 1 tablet (0.1 mg total) by mouth 2 (two) times daily., Disp: 180 tablet, Rfl: 3 .  Continuous Blood Gluc Receiver (FREESTYLE LIBRE 14 DAY READER) DEVI, by Does not apply route., Disp: , Rfl:  .  ELMIRON 100 MG capsule, Take 200 mg by mouth 2 (two) times daily. , Disp: , Rfl:  .  erythromycin (E-MYCIN) 250 MG tablet, Take  250 mg by mouth 4 (four) times daily., Disp: , Rfl:  .  FLUoxetine (PROZAC) 20 MG capsule, TAKE ONE CAPSULE BY MOUTH DAILY, Disp: 90 capsule, Rfl: 3 .  gabapentin (NEURONTIN) 300 MG capsule, Take 300 mg by mouth 3 (three) times daily. , Disp: , Rfl:  .  gabapentin (NEURONTIN) 600 MG tablet, Take 600 mg by mouth 3 (three) times daily., Disp: , Rfl:  .  glucose blood (FREESTYLE TEST STRIPS) test strip, Use as instructed to check blood sugar 4 times daily., Disp: 100 each, Rfl: 3 .  HYDROcodone-acetaminophen (NORCO/VICODIN) 5-325 MG tablet, Take 1 tablet by mouth 4 (four) times daily., Disp: , Rfl:  .  insulin glargine (LANTUS SOLOSTAR) 100 UNIT/ML Solostar Pen, Inject 18 Units into the skin daily., Disp: 15 mL, Rfl: 2 .  Insulin Lispro Prot & Lispro (HUMALOG MIX 75/25 KWIKPEN) (75-25) 100 UNIT/ML Kwikpen, Inject 20 Units into the skin daily with breakfast. (Patient taking differently: Inject 10 Units into the skin 2 (two) times daily. Patient taking 10 units with breakfast  and 10 units with lunch.), Disp: 15 mL, Rfl: 11 .  isosorbide mononitrate (IMDUR) 60 MG 24 hr tablet, Take 1 tablet (60 mg total) by mouth daily., Disp: 90 tablet, Rfl: 3 .  levothyroxine (SYNTHROID) 125 MCG tablet, Take 1 tablet by mouth daily, Sunday-Friday. On Saturday, take 1.5 tablets., Disp: 120 tablet, Rfl: 1 .  linaclotide (LINZESS) 72 MCG capsule, Take 1 capsule (72 mcg total) by mouth daily before breakfast., Disp: 30 capsule, Rfl: 11 .  losartan (COZAAR) 100 MG tablet, Take one tab daily, Disp: 90 tablet, Rfl: 3 .  metoprolol succinate (TOPROL-XL) 50 MG 24 hr tablet, Take 1 tablet (50 mg total) by mouth daily. Take with or immediately following a meal., Disp: 90 tablet, Rfl: 3 .  nitroGLYCERIN (NITROSTAT) 0.4 MG SL tablet, Place 1 tablet (0.4 mg total) under the tongue every 5 (five) minutes as needed for chest pain., Disp: 20 tablet, Rfl: 0 .  ondansetron (ZOFRAN) 4 MG tablet, Take 1 tablet (4 mg total) by mouth every 8  (eight) hours as needed for nausea or vomiting., Disp: 30 tablet, Rfl: 1 .  pantoprazole (PROTONIX) 40 MG tablet, TAKE 1 TABLET(40 MG) BY MOUTH TWICE DAILY, Disp: 180 tablet, Rfl: 3 .  phentermine 37.5 MG capsule, Take 1 capsule (37.5 mg total) by mouth every morning., Disp: 30 capsule, Rfl: 2 .  polyethylene glycol (MIRALAX / GLYCOLAX) 17 g packet, Take 17 g by mouth daily as needed., Disp: 14 each, Rfl: 0 .  tamsulosin (FLOMAX) 0.4 MG CAPS capsule, Take 0.4 mg by mouth daily., Disp: , Rfl:  .  traMADol (ULTRAM) 50 MG tablet, Take 1 tablet (50 mg total) by mouth every 6 (six) hours as needed., Disp: 8 tablet, Rfl: 0 Allergies  Allergen Reactions  . Ciprofloxacin Shortness Of Breath and Other (See Comments)    Dizziness   . Hydrocodone Shortness Of Breath and Other (See Comments)    Tachycardia  . Penicillins Hives    Pt just remembers hives Has patient had a PCN reaction causing immediate rash, facial/tongue/throat swelling, SOB or lightheadedness with hypotension: No Has patient had a PCN reaction causing severe rash involving mucus membranes or skin necrosis: No Has patient had a PCN reaction that required hospitalization No Has patient had a PCN reaction occurring within the last 10 years: No If all of the above answers are "NO", then may proceed with Cephalosporin use.    Social History   Tobacco Use  Smoking Status Former Smoker  Smokeless Tobacco Never Used  Tobacco Comment   quit 30 years ago   Objective:  There were no vitals filed for this visit. Constitutional Patient is a pleasant 68 y.o. African American @[employer] @ , morbidly obese in NAD.Marland Kitchen  Vascular Capillary refill time to digits immediate b/l. Palpable pedal pulses b/l LE. Pedal hair sparse. Lower extremity skin temperature gradient within normal limits. No pain with calf compression b/l. No ischemia or gangrene noted b/l lower extremities. No cyanosis or clubbing noted.  Neurologic Normal speech. Oriented to person,  place, and time. Protective sensation intact 5/5 intact bilaterally with 10g monofilament b/l. Vibratory sensation intact b/l.  Dermatologic Pedal skin with normal turgor, texture and tone bilaterally. No open wounds bilaterally. No interdigital macerations bilaterally. Toenails 1-5 right, L 3rd toe, L 4th toe and L 5th toe elongated, discolored, dystrophic, thickened, and crumbly with subungual debris and tenderness to dorsal palpation. Anonychia noted L hallux and L 2nd toe. Nailbed(s) epithelialized.   Orthopedic: Normal muscle strength 5/5 to  all lower extremity muscle groups bilaterally. No pain crepitus or joint limitation noted with ROM b/l. Pes planus deformity noted b/l.  Pain noted along posterior tibial tendon right lower extremity. Patient ambulates independent of any assistive aids.   Hemoglobin A1C Latest Ref Rng & Units 04/27/2020 01/26/2020 12/27/2019 09/08/2019 05/12/2019  HGBA1C 4.0 - 5.6 % 8.3(A) 8.1(H) 9.0(H) 7.3(A) 7.0(A)  Some recent data might be hidden   Assessment:   1. Pain due to onychomycosis of toenails of both feet   2. Posterior tibial tendinitis of right lower extremity   3. Uncontrolled diabetes mellitus with stage 4 chronic kidney disease ([place])    Plan:  Patient was evaluated and treated and all questions answered.  Onychomycosis with pain -Nails palliatively debridement as below. -Educated on self-care  Procedure: Nail Debridement Rationale: Pain Type of Debridement: manual, sharp debridement. Instrumentation: Nail nipper, rotary burr. Number of Nails: 8  -Examined patient. -No new findings. No new orders. -Continue diabetic foot care principles. -Toenails 1-5 right, L 3rd toe, L 4th toe and L 5th toe debrided in length and girth without iatrogenic bleeding with sterile nail nipper and dremel.  -She is being casted for brace of RLE for painful posterior tibial tendon dysfunction secondary to longstanding pes planus.  -Patient to report any pedal injuries  to medical professional immediately. -Patient to continue soft, supportive shoe gear daily. -Patient/POA to call should there be question/concern in the interim.  Return in about 3 months (around 07/27/2020) for diabetic nail trim.  Marzetta Board, DPM

## 2020-05-05 ENCOUNTER — Encounter: Payer: Self-pay | Admitting: Internal Medicine

## 2020-05-05 ENCOUNTER — Other Ambulatory Visit (INDEPENDENT_AMBULATORY_CARE_PROVIDER_SITE_OTHER): Payer: Medicare Other

## 2020-05-05 ENCOUNTER — Ambulatory Visit (INDEPENDENT_AMBULATORY_CARE_PROVIDER_SITE_OTHER): Payer: Medicare Other | Admitting: Internal Medicine

## 2020-05-05 ENCOUNTER — Other Ambulatory Visit: Payer: Self-pay

## 2020-05-05 DIAGNOSIS — R1031 Right lower quadrant pain: Secondary | ICD-10-CM

## 2020-05-05 DIAGNOSIS — E119 Type 2 diabetes mellitus without complications: Secondary | ICD-10-CM | POA: Diagnosis not present

## 2020-05-05 DIAGNOSIS — I10 Essential (primary) hypertension: Secondary | ICD-10-CM | POA: Diagnosis not present

## 2020-05-05 LAB — URINALYSIS, ROUTINE W REFLEX MICROSCOPIC
Bilirubin Urine: NEGATIVE
Hgb urine dipstick: NEGATIVE
Ketones, ur: NEGATIVE
Leukocytes,Ua: NEGATIVE
Nitrite: NEGATIVE
RBC / HPF: NONE SEEN (ref 0–?)
Specific Gravity, Urine: 1.02 (ref 1.000–1.030)
Total Protein, Urine: NEGATIVE
Urine Glucose: 100 — AB
Urobilinogen, UA: 0.2 (ref 0.0–1.0)
pH: 6 (ref 5.0–8.0)

## 2020-05-05 LAB — COMPREHENSIVE METABOLIC PANEL
ALT: 22 U/L (ref 0–35)
AST: 17 U/L (ref 0–37)
Albumin: 3.9 g/dL (ref 3.5–5.2)
Alkaline Phosphatase: 85 U/L (ref 39–117)
BUN: 28 mg/dL — ABNORMAL HIGH (ref 6–23)
CO2: 25 mEq/L (ref 19–32)
Calcium: 9.3 mg/dL (ref 8.4–10.5)
Chloride: 105 mEq/L (ref 96–112)
Creatinine, Ser: 1.85 mg/dL — ABNORMAL HIGH (ref 0.40–1.20)
GFR: 32.59 mL/min — ABNORMAL LOW (ref >60.00)
Glucose, Bld: 304 mg/dL — ABNORMAL HIGH (ref 70–99)
Potassium: 4 mEq/L (ref 3.5–5.1)
Sodium: 137 mEq/L (ref 135–145)
Total Bilirubin: 0.5 mg/dL (ref 0.2–1.2)
Total Protein: 7.3 g/dL (ref 6.0–8.3)

## 2020-05-05 LAB — CBC WITH DIFFERENTIAL/PLATELET
Basophils Absolute: 0.1 10*3/uL (ref 0.0–0.1)
Basophils Relative: 1.3 % (ref 0.0–3.0)
Eosinophils Absolute: 0.3 10*3/uL (ref 0.0–0.7)
Eosinophils Relative: 4.2 % (ref 0.0–5.0)
HCT: 32.5 % — ABNORMAL LOW (ref 36.0–46.0)
Hemoglobin: 10.4 g/dL — ABNORMAL LOW (ref 12.0–15.0)
Lymphocytes Relative: 34.1 % (ref 12.0–46.0)
Lymphs Abs: 2.1 10*3/uL (ref 0.7–4.0)
MCHC: 32.1 g/dL (ref 30.0–36.0)
MCV: 98.5 fl (ref 78.0–100.0)
Monocytes Absolute: 0.4 10*3/uL (ref 0.1–1.0)
Monocytes Relative: 6.3 % (ref 3.0–12.0)
Neutro Abs: 3.3 10*3/uL (ref 1.4–7.7)
Neutrophils Relative %: 54.1 % (ref 43.0–77.0)
Platelets: 246 10*3/uL (ref 150.0–400.0)
RBC: 3.3 Mil/uL — ABNORMAL LOW (ref 3.87–5.11)
RDW: 14.2 % (ref 11.5–15.5)
WBC: 6.2 10*3/uL (ref 4.0–10.5)

## 2020-05-05 LAB — LIPASE: Lipase: 33 U/L (ref 11.0–59.0)

## 2020-05-05 NOTE — Patient Instructions (Signed)
Please continue all other medications as before, and refills have been done if requested.  Please have the pharmacy call with any other refills you may need.  Please continue your efforts at being more active, low cholesterol diet, and weight control.  Please keep your appointments with your specialists as you may have planned  Please go to the XRAY Department in the first floor for the x-ray testing - at the [facility] site  Please go to the LAB at the blood drawing area for the tests to be done - at the [facility] will be contacted by phone if any changes need to be made immediately.  Otherwise, you will receive a letter about your results with an explanation, but please check with MyChart first.  Please remember to sign up for MyChart if you have not done so, as this will be important to you in the future with finding out test results, communicating by private email, and scheduling acute appointments online when needed.

## 2020-05-05 NOTE — Progress Notes (Signed)
Subjective:    Patient ID: [name], female    DOB: April 25, 1951, 69 y.o.   MRN: [id number]  HPI  Here with 1 wk right lower abd and side pain, has had some nausea and vomiting several times, feeling hot and col, but no fever, diarrhea, constipation or blood.  Has had a couple of dark stools. Overall pain mild to mod, interrmittent, Denies urinary symptoms such as dysuria, frequency, urgency, flank pain, hematuria or n/v, fever, chills.  Denies worsening reflux, dysphagia as well.  Pt denies chest pain, increased sob or doe, wheezing, orthopnea, PND, increased LE swelling, palpitations, dizziness or syncope.  Pt denies new neurological symptoms such as new headache, or facial or extremity weakness or numbness   Pt denies polydipsia, polyuria, declines ED eval today Past Medical History:  Diagnosis Date  . Anxiety   . Arthritis   . Blood dyscrasia    "FREE BLEEDER"  . CERVICAL RADICULOPATHY, LEFT   . Chronic back pain   . CKD (chronic kidney disease)    DR. SANFORD  [facility]  . COMMON MIGRAINE   . CORONARY ARTERY DISEASE   . Cough    CURRENT COLD  . Cystitis   . DEPRESSION   . Diabetes mellitus, type II ([place])   . DIVERTICULOSIS-COLON   . Dysrhythmia    palpitations  . Gastric ulcer 04/2008  . Gastroparesis   . GERD (gastroesophageal reflux disease)   . Hiatal hernia   . Hyperlipidemia   . Hypertension   . Hypothyroidism   . INSOMNIA-SLEEP DISORDER-UNSPEC   . Iron deficiency anemia   . Wears glasses    Past Surgical History:  Procedure Laterality Date  . ABDOMINAL HYSTERECTOMY    . ANTERIOR LAT LUMBAR FUSION Left 08/13/2017   Procedure: LEFT SIDED LUMBAR 3-4 LATERAL INTERBODY FUSION WITH INSTRUMENTATION AND ALLOGRAFT;  Surgeon: Phylliss Bob, MD;  Location: [facility];  Service: Orthopedics;  Laterality: Left;  LEFT SIDED LUMBAR 3-4 LATERAL INTERBODY FUSION WITH INSTRUMENTATION AND ALLOGRAFT; REQUEST 3 HOURS  . Back Stimulator  07/2018  . BACK SURGERY  03/2016  .  BREAST EXCISIONAL BIOPSY Right    40 years ago  . CHOLECYSTECTOMY  06/2009  . COLONOSCOPY    . ESOPHAGOGASTRODUODENOSCOPY    . EYE SURGERY Bilateral    lasik  . Gastric Wedge resection lipoma  11/2007   x2 with laparotomy and gastrostomy  . JOINT REPLACEMENT    . Left knee replacement    . Rigth knee replacement with revision  04/2008   Dr. Berenice Primas  . ROTATOR CUFF REPAIR Left 01/2009  . s/p bladder surgury  09/2009   Dr. Terance Hart  . SPINAL CORD STIMULATOR INSERTION N/A 09/11/2018   Procedure: LUMBAR SPINAL CORD STIMULATOR INSERTION;  Surgeon: Clydell Hakim, MD;  Location: [facility];  Service: Neurosurgery;  Laterality: N/A;  LUMBAR SPINAL CORD STIMULATOR INSERTION    reports that she has quit smoking. She has never used smokeless tobacco. She reports that she does not drink alcohol and does not use drugs. family history includes Breast cancer (age of onset: 60) in an other family member; Breast cancer (age of onset: 47) in her sister; Breast cancer (age of onset: 74) in her sister; Coronary artery disease in an other family member; Diabetes in her brother, brother, and sister; Heart disease in her brother and sister; Hypertension in an other family member. Allergies  Allergen Reactions  . Ciprofloxacin Shortness Of Breath and Other (See Comments)    Dizziness   .  Hydrocodone Shortness Of Breath and Other (See Comments)    Tachycardia  . Penicillins Hives    Pt just remembers hives Has patient had a PCN reaction causing immediate rash, facial/tongue/throat swelling, SOB or lightheadedness with hypotension: No Has patient had a PCN reaction causing severe rash involving mucus membranes or skin necrosis: No Has patient had a PCN reaction that required hospitalization No Has patient had a PCN reaction occurring within the last 10 years: No If all of the above answers are "NO", then may proceed with Cephalosporin use.    Review of Systems All otherwise neg per pt     Objective:   Physical  Exam BP (!) 164/80 (BP Location: Left Arm, Patient Position: Sitting, Cuff Size: Large)   Pulse 81   Temp 98.5 F (36.9 C) (Oral)   Ht 5\' 6"  (1.676 m)   Wt (!) 293 lb (132.9 kg)   SpO2 99%   BMI 47.29 kg/m  VS noted,  Constitutional: Pt appears in NAD HENT: Head: NCAT.  Right Ear: External ear normal.  Left Ear: External ear normal.  Eyes: . Pupils are equal, round, and reactive to light. Conjunctivae and EOM are normal Nose: without d/c or deformity Neck: Neck supple. Gross normal ROM Cardiovascular: Normal rate and regular rhythm.   Pulmonary/Chest: Effort normal and breath sounds without rales or wheezing.  Abd:  Soft, mild rlq tender, ND, + BS, no organomegaly, no guarding or rebound Neurological: Pt is alert. At baseline orientation, motor grossly intact Skin: Skin is warm. No rashes, other new lesions, no LE edema Psychiatric: Pt behavior is normal without agitation  All otherwise neg per pt     Assessment & Plan:

## 2020-05-07 ENCOUNTER — Encounter: Payer: Self-pay | Admitting: Internal Medicine

## 2020-05-07 NOTE — Assessment & Plan Note (Signed)
stable overall by history and exam, recent data reviewed with pt, and pt to continue medical treatment as before,  to f/u any worsening symptoms or concerns  

## 2020-05-07 NOTE — Assessment & Plan Note (Addendum)
Mild intermittent, no fever or overt bleeding, pt declines ED but ok for labs and xray as ordered today, cont same tx pending results  I spent 31 minutes in preparing to see the patient by review of recent labs, imaging and procedures, obtaining and reviewing separately obtained history, communicating with the patient and family or caregiver, ordering medications, tests or procedures, and documenting clinical information in the EHR including the differential Dx, treatment, and any further evaluation and other management of rlq pain, htn, dm

## 2020-05-08 ENCOUNTER — Ambulatory Visit (INDEPENDENT_AMBULATORY_CARE_PROVIDER_SITE_OTHER): Payer: Medicare Other | Admitting: Podiatry

## 2020-05-08 ENCOUNTER — Ambulatory Visit (INDEPENDENT_AMBULATORY_CARE_PROVIDER_SITE_OTHER)
Admission: RE | Admit: 2020-05-08 | Discharge: 2020-05-08 | Disposition: A | Discharge status: Home / Self Care | Payer: Medicare Other | Source: Ambulatory Visit | Attending: Internal Medicine | Admitting: Internal Medicine

## 2020-05-08 ENCOUNTER — Encounter: Payer: Self-pay | Admitting: Podiatry

## 2020-05-08 ENCOUNTER — Other Ambulatory Visit: Payer: Self-pay

## 2020-05-08 DIAGNOSIS — R1031 Right lower quadrant pain: Secondary | ICD-10-CM

## 2020-05-08 DIAGNOSIS — R109 Unspecified abdominal pain: Secondary | ICD-10-CM | POA: Diagnosis not present

## 2020-05-08 DIAGNOSIS — M778 Other enthesopathies, not elsewhere classified: Secondary | ICD-10-CM

## 2020-05-08 DIAGNOSIS — M76821 Posterior tibial tendinitis, right leg: Secondary | ICD-10-CM

## 2020-05-08 DIAGNOSIS — R112 Nausea with vomiting, unspecified: Secondary | ICD-10-CM | POA: Diagnosis not present

## 2020-05-08 DIAGNOSIS — M779 Enthesopathy, unspecified: Secondary | ICD-10-CM

## 2020-05-09 ENCOUNTER — Encounter: Payer: Self-pay | Admitting: Internal Medicine

## 2020-05-09 NOTE — Progress Notes (Signed)
Subjective:   Patient ID: [name], female   DOB: 69 y.o.   MRN: [id number]   HPI Patient presents stating her ankles have really been hurting her and swelling right over left foot and leg with obesity certainly complicating factor for her    ROS      Objective:  Physical Exam  Neurovascular status was found to be intact with patient having negative Bevelyn Buckles' sign but quite a bit of lower extremity obesity with swelling around the ankle with exquisite discomfort in the sinus tarsi bilateral     Assessment:  Severe foot structure with patient wearing AFO brace left and found to have inflammation of the sinus tarsi bilateral with fluid buildup     Plan:  H&P reviewed condition and at this point I am placing her in compression stockings to try to help with swelling and I did sterile prep and injected the sinus tarsi bilateral 3 mg Kenalog 5 mg Xylocaine we will see the results of this procedure

## 2020-05-12 ENCOUNTER — Telehealth: Payer: Self-pay | Admitting: Endocrinology

## 2020-05-12 NOTE — Telephone Encounter (Signed)
Spoke with patient regarding adjustments to insulin. Patient verbalized understanding and did not have any questions.

## 2020-05-12 NOTE — Telephone Encounter (Signed)
Left voicemail message for patient to call back.

## 2020-05-12 NOTE — Telephone Encounter (Signed)
She will need to take additional 20 units of the Humalog mix and 8 units of the Lantus now. From tomorrow morning she needs to take 30 units of the Humalog mix in the mornings on waking up until blood sugars are below 200

## 2020-05-12 NOTE — Telephone Encounter (Signed)
Please advise 

## 2020-05-12 NOTE — Telephone Encounter (Signed)
Patient called to advise that she has had 2 cortisone shots this week.  Her blood sugars are running very high, she was able to bring it down to 358 this morning, but it is going back up now.  Patient would like a call back to [phone] with advise on how to manager blood sugar under these circumstances.

## 2020-05-18 ENCOUNTER — Ambulatory Visit: Payer: Medicare Other | Admitting: Internal Medicine

## 2020-05-18 DIAGNOSIS — Z0289 Encounter for other administrative examinations: Secondary | ICD-10-CM

## 2020-05-20 ENCOUNTER — Other Ambulatory Visit: Payer: Self-pay | Admitting: Internal Medicine

## 2020-05-20 NOTE — Telephone Encounter (Signed)
Please refill as per office routine med refill policy (all routine meds refilled for 3 mo or monthly per pt preference up to one year from last visit, then month to month grace period for 3 mo, then further med refills will have to be denied)  

## 2020-05-22 DIAGNOSIS — N189 Chronic kidney disease, unspecified: Secondary | ICD-10-CM | POA: Diagnosis not present

## 2020-05-22 DIAGNOSIS — H401131 Primary open-angle glaucoma, bilateral, mild stage: Secondary | ICD-10-CM | POA: Diagnosis not present

## 2020-05-22 DIAGNOSIS — E113391 Type 2 diabetes mellitus with moderate nonproliferative diabetic retinopathy without macular edema, right eye: Secondary | ICD-10-CM | POA: Diagnosis not present

## 2020-05-22 DIAGNOSIS — I25118 Atherosclerotic heart disease of native coronary artery with other forms of angina pectoris: Secondary | ICD-10-CM | POA: Diagnosis not present

## 2020-05-22 DIAGNOSIS — H31093 Other chorioretinal scars, bilateral: Secondary | ICD-10-CM | POA: Diagnosis not present

## 2020-05-22 DIAGNOSIS — E785 Hyperlipidemia, unspecified: Secondary | ICD-10-CM | POA: Diagnosis not present

## 2020-05-22 DIAGNOSIS — H34832 Tributary (branch) retinal vein occlusion, left eye, with macular edema: Secondary | ICD-10-CM | POA: Diagnosis not present

## 2020-05-22 DIAGNOSIS — E1140 Type 2 diabetes mellitus with diabetic neuropathy, unspecified: Secondary | ICD-10-CM | POA: Diagnosis not present

## 2020-05-22 DIAGNOSIS — I10 Essential (primary) hypertension: Secondary | ICD-10-CM | POA: Diagnosis not present

## 2020-05-22 DIAGNOSIS — E113312 Type 2 diabetes mellitus with moderate nonproliferative diabetic retinopathy with macular edema, left eye: Secondary | ICD-10-CM | POA: Diagnosis not present

## 2020-05-25 ENCOUNTER — Other Ambulatory Visit: Payer: Self-pay

## 2020-05-25 MED ORDER — PHENTERMINE HCL 37.5 MG PO CAPS: 37.5000 mg | ORAL_CAPSULE | ORAL | 2 refills | Status: DC

## 2020-05-29 ENCOUNTER — Other Ambulatory Visit: Payer: Self-pay

## 2020-05-29 ENCOUNTER — Encounter: Payer: Self-pay | Admitting: Endocrinology

## 2020-05-29 ENCOUNTER — Ambulatory Visit (INDEPENDENT_AMBULATORY_CARE_PROVIDER_SITE_OTHER): Payer: Medicare Other | Admitting: Endocrinology

## 2020-05-29 VITALS — BP 160/82 | HR 73 | Ht 66.0 in | Wt 289.0 lb

## 2020-05-29 DIAGNOSIS — Z794 Long term (current) use of insulin: Secondary | ICD-10-CM | POA: Diagnosis not present

## 2020-05-29 DIAGNOSIS — E890 Postprocedural hypothyroidism: Secondary | ICD-10-CM

## 2020-05-29 DIAGNOSIS — E1165 Type 2 diabetes mellitus with hyperglycemia: Secondary | ICD-10-CM | POA: Diagnosis not present

## 2020-05-29 NOTE — Progress Notes (Addendum)
[name] is a 69 y.o. female.    Reason for Appointment: follow-up   History of Present Illness   Diagnosis: Type 2 DIABETES MELITUS, date of diagnosis: 2000  Previous history: She had initially been treated with metformin and Amaryl and subsequently given Victoza which helped overall control and weight loss. In 9/13 because of significant hyperglycemia she was started on basal bolus insulin also. Had required relatively small doses of insulin. Her blood sugar control has been excellent with A1c between 5.3-6.0 although this may be relatively higher than expected. Tends to have relatively higher readings after supper In 6/15 she was told to resume her Victoza and stop her Lantus since she had been gaining weight with Lantus and NovoLog regimen.   Recent history:     INSULIN regimen: Lantus 18 units before breakfast, Humalog mix 20 units before first meal and 20 units as needed at night   Non-insulin hypoglycemic drugs: None   A1c is 8.3  Fructosamine previously 452  Current blood sugar patterns and management:  She has again not following the instructions for her insulin regimen  She was told to cut back on her Lantus and increase her Humalog mix and take it on waking up in the morning and before eating in the evening  However she has not reduced her Lantus and still gets occasional low blood sugars early morning  Also she is not taking her morning insulin until early afternoon when she is eating the first meal when the blood sugar has already gone up to nearly 300  Also with her appetite being inconsistent she will not eat consistently in the evening  She will only take the Humalog mix at night when she has a significantly high sugar and this will cause the sugar to be low early next morning  HIGHEST blood sugars on average are between about 2 PM-2 AM  As before blood sugars increase significantly after 8 AM and continue to increase in the afternoons     Side effects from medications:  none   Glucometer:  Freestyle Libre/Accu-Chek  CGM use % of time  62  2-week average/SD  270  Time in range     19   %  % Time Above 180  21  % Time above 250  58  % Time Below 70 2     PRE-MEAL Fasting Lunch Dinner Bedtime Overall  Glucose range:       Averages:  204   312   270   POST-MEAL PC Breakfast PC Lunch PC Dinner  Glucose range:     Averages:  301   316    Previous data:   CGM use % of time  68  2-week average/SD  233  Time in range       21%  % Time Above 180  22  % Time above 250  48  % Time Below 70 9     PRE-MEAL Fasting Lunch Dinner Bedtime Overall  Glucose range:       Averages:  178   295  287    POST-MEAL PC Breakfast PC Lunch PC Dinner  Glucose range:     Averages:  275   303    Meals: 1-2 meals per day. Lunch 12 Supper at 6-8 PM            Physical activity: exercise: Limited, has had fatigue, back and leg pain.  Dietician visit: Most recent: 01/980            Complications: are: None  Wt Readings from Last 3 Encounters:  05/29/20 289 lb (131.1 kg)  05/05/20 (!) 293 lb (132.9 kg)  04/27/20 (!) 294 lb (133.4 kg)    LABS:  Lab Results  Component Value Date   HGBA1C 8.3 (A) 04/27/2020   HGBA1C 8.1 (H) 01/26/2020   HGBA1C 9.0 (H) 12/27/2019   Lab Results  Component Value Date   MICROALBUR <0.7 04/22/2019   LDLCALC 105 (H) 01/26/2020   CREATININE 1.85 (H) 05/05/2020   Lab Results  Component Value Date   FRUCTOSAMINE 452 (H) 01/25/2020   FRUCTOSAMINE 401 (H) 10/20/2019   FRUCTOSAMINE 369 (H) 09/08/2019     Other active problems: See review of systems   No visits with results within 1 Week(s) from this visit.  Latest known visit with results is:  Lab on 05/05/2020  Component Date Value Ref Range Status  . WBC 05/05/2020 6.2  4.0 - 10.5 K/uL Final  . RBC 05/05/2020 3.30* 3.87 - 5.11 Mil/uL Final  . Hemoglobin 05/05/2020 10.4* 12.0 - 15.0 g/dL Final  . HCT 05/05/2020 32.5* 36  - 46 % Final  . MCV 05/05/2020 98.5  78.0 - 100.0 fl Final  . MCHC 05/05/2020 32.1  30.0 - 36.0 g/dL Final  . RDW 05/05/2020 14.2  11.5 - 15.5 % Final  . Platelets 05/05/2020 246.0  150 - 400 K/uL Final  . Neutrophils Relative % 05/05/2020 54.1  43 - 77 % Final  . Lymphocytes Relative 05/05/2020 34.1  12 - 46 % Final  . Monocytes Relative 05/05/2020 6.3  3 - 12 % Final  . Eosinophils Relative 05/05/2020 4.2  0 - 5 % Final  . Basophils Relative 05/05/2020 1.3  0 - 3 % Final  . Neutro Abs 05/05/2020 3.3  1.4 - 7.7 K/uL Final  . Lymphs Abs 05/05/2020 2.1  0.7 - 4.0 K/uL Final  . Monocytes Absolute 05/05/2020 0.4  0 - 1 K/uL Final  . Eosinophils Absolute 05/05/2020 0.3  0 - 0 K/uL Final  . Basophils Absolute 05/05/2020 0.1  0 - 0 K/uL Final  . Color, Urine 05/05/2020 YELLOW  Yellow;Lt. Yellow;Straw;Dark Yellow;Amber;Green;Red;Brown Final  . APPearance 05/05/2020 Sl Cloudy* Clear;Turbid;Slightly Cloudy;Cloudy Final  . Specific Gravity, Urine 05/05/2020 1.020  1.000 - 1.030 Final  . pH 05/05/2020 6.0  5.0 - 8.0 Final  . Total Protein, Urine 05/05/2020 NEGATIVE  Negative Final  . Urine Glucose 05/05/2020 100* Negative Final  . Ketones, ur 05/05/2020 NEGATIVE  Negative Final  . Bilirubin Urine 05/05/2020 NEGATIVE  Negative Final  . Hgb urine dipstick 05/05/2020 NEGATIVE  Negative Final  . Urobilinogen, UA 05/05/2020 0.2  0.0 - 1.0 Final  . Leukocytes,Ua 05/05/2020 NEGATIVE  Negative Final  . Nitrite 05/05/2020 NEGATIVE  Negative Final  . WBC, UA 05/05/2020 0-2/hpf  0-2/hpf Final  . RBC / HPF 05/05/2020 none seen  0-2/hpf Final  . Squamous Epithelial / LPF 05/05/2020 Few(5-10/hpf)* Rare(0-4/hpf) Final  . Bacteria, UA 05/05/2020 Rare(<10/hpf)* None Final  . Clue Cells Wet Prep HPF POC 05/05/2020 Few(<6/hpf)* None;Present Final  . Lipase 05/05/2020 33.0  11 - 59 U/L Final  . Sodium 05/05/2020 137  135 - 145 mEq/L Final  . Potassium 05/05/2020 4.0  3.5 - 5.1 mEq/L Final  . Chloride 05/05/2020  105  96 - 112 mEq/L Final  . CO2 05/05/2020 25  19 - 32 mEq/L Final  . Glucose, Bld 05/05/2020  304* 70 - 99 mg/dL Final  . BUN 05/05/2020 28* 6 - 23 mg/dL Final  . Creatinine, Ser 05/05/2020 1.85* 0.40 - 1.20 mg/dL Final  . Total Bilirubin 05/05/2020 0.5  0.2 - 1.2 mg/dL Final  . Alkaline Phosphatase 05/05/2020 85  39 - 117 U/L Final  . AST 05/05/2020 17  0 - 37 U/L Final  . ALT 05/05/2020 22  0 - 35 U/L Final  . Total Protein 05/05/2020 7.3  6.0 - 8.3 g/dL Final  . Albumin 05/05/2020 3.9  3.5 - 5.2 g/dL Final  . GFR 05/05/2020 32.59* >60.00 mL/min Final  . Calcium 05/05/2020 9.3  8.4 - 10.5 mg/dL Final    Allergies as of 05/29/2020      Reactions   Ciprofloxacin Shortness Of Breath, Other (See Comments)   Dizziness   Hydrocodone Shortness Of Breath, Other (See Comments)   Tachycardia   Penicillins Hives   Pt just remembers hives Has patient had a PCN reaction causing immediate rash, facial/tongue/throat swelling, SOB or lightheadedness with hypotension: No Has patient had a PCN reaction causing severe rash involving mucus membranes or skin necrosis: No Has patient had a PCN reaction that required hospitalization No Has patient had a PCN reaction occurring within the last 10 years: No If all of the above answers are "NO", then may proceed with Cephalosporin use.      Medication List       Accurate as of May 29, 2020 11:59 PM. If you have any questions, ask your nurse or doctor.        aspirin 81 MG EC tablet Take 81 mg by mouth every evening.   atorvastatin 40 MG tablet Commonly known as: LIPITOR Take 1 tablet (40 mg total) by mouth at bedtime.   buPROPion 150 MG 24 hr tablet Commonly known as: WELLBUTRIN XL TAKE 1 TABLET BY MOUTH DAILY   chlorhexidine 0.12 % solution Commonly known as: PERIDEX See admin instructions.   cloNIDine 0.1 MG tablet Commonly known as: CATAPRES Take 1 tablet (0.1 mg total) by mouth 2 (two) times daily.   Elmiron 100 MG  capsule Generic drug: pentosan polysulfate Take 200 mg by mouth 2 (two) times daily.   erythromycin 250 MG tablet Commonly known as: E-MYCIN Take 250 mg by mouth 4 (four) times daily.   FLUoxetine 20 MG capsule Commonly known as: PROZAC TAKE ONE CAPSULE BY MOUTH DAILY   FreeStyle Libre 14 Day Reader Kerrin Mo by Does not apply route.   gabapentin 300 MG capsule Commonly known as: NEURONTIN Take 300 mg by mouth 3 (three) times daily.   gabapentin 600 MG tablet Commonly known as: NEURONTIN Take 600 mg by mouth 3 (three) times daily.   glucose blood test strip Commonly known as: FREESTYLE TEST STRIPS Use as instructed to check blood sugar 4 times daily.   HYDROcodone-acetaminophen 5-325 MG tablet Commonly known as: NORCO/VICODIN Take 1 tablet by mouth 4 (four) times daily.   Insulin Lispro Prot & Lispro (75-25) 100 UNIT/ML Kwikpen Commonly known as: HumaLOG Mix 75/25 KwikPen Inject 20 Units into the skin daily with breakfast. What changed:   how much to take  when to take this  additional instructions   isosorbide mononitrate 60 MG 24 hr tablet Commonly known as: IMDUR Take 1 tablet (60 mg total) by mouth daily.   Lantus SoloStar 100 UNIT/ML Solostar Pen Generic drug: insulin glargine Inject 18 Units into the skin daily.   levothyroxine 125 MCG tablet Commonly known as: SYNTHROID Take 1 tablet  by mouth daily, Sunday-Friday. On Saturday, take 1.5 tablets.   linaclotide 72 MCG capsule Commonly known as: Linzess Take 1 capsule (72 mcg total) by mouth daily before breakfast.   losartan 100 MG tablet Commonly known as: COZAAR TAKE 1 TABLET BY MOUTH DAILY   metoprolol succinate 50 MG 24 hr tablet Commonly known as: TOPROL-XL TAKE 1 TABLET BY MOUTH DAILY WITH OR IMMEDIATELY FOLLOWING A MEAL   nitroGLYCERIN 0.4 MG SL tablet Commonly known as: NITROSTAT Place 1 tablet (0.4 mg total) under the tongue every 5 (five) minutes as needed for chest pain.   ondansetron 4  MG tablet Commonly known as: Zofran Take 1 tablet (4 mg total) by mouth every 8 (eight) hours as needed for nausea or vomiting.   pantoprazole 40 MG tablet Commonly known as: PROTONIX TAKE 1 TABLET(40 MG) BY MOUTH TWICE DAILY   phentermine 37.5 MG capsule Take 1 capsule (37.5 mg total) by mouth every morning.   polyethylene glycol 17 g packet Commonly known as: MIRALAX / GLYCOLAX Take 17 g by mouth daily as needed.   tamsulosin 0.4 MG Caps capsule Commonly known as: FLOMAX Take 0.4 mg by mouth daily.   traMADol 50 MG tablet Commonly known as: ULTRAM Take 1 tablet (50 mg total) by mouth every 6 (six) hours as needed.       Allergies:  Allergies  Allergen Reactions  . Ciprofloxacin Shortness Of Breath and Other (See Comments)    Dizziness   . Hydrocodone Shortness Of Breath and Other (See Comments)    Tachycardia  . Penicillins Hives    Pt just remembers hives Has patient had a PCN reaction causing immediate rash, facial/tongue/throat swelling, SOB or lightheadedness with hypotension: No Has patient had a PCN reaction causing severe rash involving mucus membranes or skin necrosis: No Has patient had a PCN reaction that required hospitalization No Has patient had a PCN reaction occurring within the last 10 years: No If all of the above answers are "NO", then may proceed with Cephalosporin use.     Past Medical History:  Diagnosis Date  . Anxiety   . Arthritis   . Blood dyscrasia    "FREE BLEEDER"  . CERVICAL RADICULOPATHY, LEFT   . Chronic back pain   . CKD (chronic kidney disease)    DR. [facility]  . COMMON MIGRAINE   . CORONARY ARTERY DISEASE   . Cough    CURRENT COLD  . Cystitis   . DEPRESSION   . Diabetes mellitus, type II ([place])   . DIVERTICULOSIS-COLON   . Dysrhythmia    palpitations  . Gastric ulcer 04/2008  . Gastroparesis   . GERD (gastroesophageal reflux disease)   . Hiatal hernia   . Hyperlipidemia   . Hypertension   .  Hypothyroidism   . INSOMNIA-SLEEP DISORDER-UNSPEC   . Iron deficiency anemia   . Wears glasses     Past Surgical History:  Procedure Laterality Date  . ABDOMINAL HYSTERECTOMY    . ANTERIOR LAT LUMBAR FUSION Left 08/13/2017   Procedure: LEFT SIDED LUMBAR 3-4 LATERAL INTERBODY FUSION WITH INSTRUMENTATION AND ALLOGRAFT;  Surgeon: Phylliss Bob, MD;  Location: [facility];  Service: Orthopedics;  Laterality: Left;  LEFT SIDED LUMBAR 3-4 LATERAL INTERBODY FUSION WITH INSTRUMENTATION AND ALLOGRAFT; REQUEST 3 HOURS  . Back Stimulator  07/2018  . BACK SURGERY  03/2016  . BREAST EXCISIONAL BIOPSY Right    40 years ago  . CHOLECYSTECTOMY  06/2009  . COLONOSCOPY    .  ESOPHAGOGASTRODUODENOSCOPY    . EYE SURGERY Bilateral    lasik  . Gastric Wedge resection lipoma  11/2007   x2 with laparotomy and gastrostomy  . JOINT REPLACEMENT    . Left knee replacement    . Rigth knee replacement with revision  04/2008   Dr. Berenice Primas  . ROTATOR CUFF REPAIR Left 01/2009  . s/p bladder surgury  09/2009   Dr. Terance Hart  . SPINAL CORD STIMULATOR INSERTION N/A 09/11/2018   Procedure: LUMBAR SPINAL CORD STIMULATOR INSERTION;  Surgeon: Clydell Hakim, MD;  Location: [facility];  Service: Neurosurgery;  Laterality: N/A;  LUMBAR SPINAL CORD STIMULATOR INSERTION    Family History  Problem Relation Age of Onset  . Diabetes Sister        x 3  . Heart disease Sister        x2  . Breast cancer Sister 50  . Diabetes Brother        x3  . Heart disease Brother        x2  . Coronary artery disease Other        female 1st degree  . Hypertension Other   . Breast cancer Sister 66  . Breast cancer Other 25  . Diabetes Brother   . Colon cancer Neg Hx     Social History:  reports that she has quit smoking. She has never used smokeless tobacco. She reports that she does not drink alcohol and does not use drugs.  Review of Systems:  Hypertension: Her blood pressure is treated with losartan 100 mg and clonidine 0.1 mg tablets, also  on metoprolol  Followed by nephrologist and PCP  BP Readings from Last 3 Encounters:  05/29/20 (!) 160/82  05/05/20 (!) 164/80  04/27/20 (!) 138/78     CKD: Her creatinine is variable, followed by nephrologist Creatinine is higher as of 3/22  Lab Results  Component Value Date   CREATININE 1.85 (H) 05/05/2020   CREATININE 1.48 (H) 04/27/2020   CREATININE 1.78 (H) 01/25/2020   CREATININE 1.67 (H) 01/25/2020   Lab Results  Component Value Date   K 4.0 05/05/2020     Lipids: Followed by PCP, LDL has been variably controlled  Taking Lipitor 40 mg  Her lipids are much worse, does also have significant hypothyroidism on this visit  Lab Results  Component Value Date   CHOL 175 01/26/2020   HDL 56 01/26/2020   LDLCALC 105 (H) 01/26/2020   LDLDIRECT 87.0 01/25/2020   TRIG 71 01/26/2020   CHOLHDL 3.1 01/26/2020     Post ablative hypothyroidism:  Previously she had I-131 treatment on 08/20/11 for Graves' disease  Her dose has been adjusted periodically  She has been taking 125 mg levothyroxine prescription consistently and has been taking 1-1/2 on Saturdays and 1 pill on the other days   TSH was low normal previously and sugars told to reduce her regimen by half tablet weekly However for some reason her TSH was much higher in March and she was significantly symptomatic with fatigue Also was having some vomiting at that time  Now she is taking 1-1/2 tablets daily of the 125 mcg dose and has taken this consistently the mornings  Lab Results  Component Value Date   TSH 0.72 04/27/2020   TSH 0.661 01/26/2020   TSH 0.42 01/25/2020   FREET4 0.81 09/08/2019   FREET4 0.95 06/21/2019   FREET4 0.86 04/22/2019    On Gabapentin 300 mg for neuropathy    Examination:   BP Marland Kitchen)  160/82 (BP Location: Left Arm, Patient Position: Sitting, Cuff Size: Large)   Pulse 73   Ht 5\' 6"  (1.676 m)   Wt 289 lb (131.1 kg)   BMI 46.65 kg/m   Body mass index is 46.65 kg/m.   No  ankle edema evident   ASSESSMENT/ PLAN:   Diabetes type 2,  with obesity:   See history of present illness for detailed discussion of current management, blood sugar patterns and problems identified  A1C is recently 9%  Even though she had appeared to require a lot of insulin on her last visit and she was recommended a total of 90 units daily her blood sugars are much lower in the last few days This is despite taking only a total of 40 units of insulin a day May be benefiting from using premixed insulin instead of only basal and bolus  Her blood sugars still tend to be highest midday and afternoon but this is partly related to her not taking her premixed insulin before eating Fasting readings are still fairly good with 20 units of Lantus despite not using [place] She may have had some nausea with [place] which is resolving  Recommendations for diabetes:  She was advised to check blood sugars consistently at all different time Emphasized the need to take her premixed insulin well before starting to eat and she needs to tell her husband to remind her to do so; also advised her that late administration of her premixed insulin may cause nocturnal hypoglycemia She will also need to reduce Lantus to 18 units to prevent overnight hypoglycemia Blood sugar targets were discussed She will try to avoid high fat or high carbohydrate meals  Hypothyroidism: Taking 125 mcg, 1-1/2 tablets daily because of very high TSH Not clear if she was having some vomiting problem gastroparesis causing inadequate absorption Labs need to be checked again  Hypertension/renal insufficiency: Losartan was stopped and needs follow-up labs Blood pressure is not as low    Patient Instructions  Make sure you check your sugars when you wake up, before each meal and at bedtime  LANTUS insulin: Take only 10 units every morning. May increase the dose to 14 units only if your sugars are running over 200 every morning  on waking up  HUMALOG MIX insulin: Take 30 units on waking up regardless of whether you eating or not. Also try to take 20 units of Humalog mix before evening meal If eating a light meal or very little still take 10 units around 9 PM every day  Do not take any HUMALOG mix at bedtime  In between if your blood sugars are high over 300 may take 8 to 10 units of NovoLog insulin which is quick acting      Elayne Snare 05/30/2020, 8:46 PM   Note: This office note was prepared with Dragon voice recognition system technology. Any transcriptional errors that result from this process are unintentional.                                           [name] is a 69 y.o. female.    Reason for Appointment: follow-up   History of Present Illness   Diagnosis: Type 2 DIABETES MELITUS, date of diagnosis: 2000  Previous history: She had initially been treated with metformin and Amaryl and subsequently given Victoza which helped overall control and weight loss. In 9/13  because of significant hyperglycemia she was started on basal bolus insulin also. Had required relatively small doses of insulin. Her blood sugar control has been excellent with A1c between 5.3-6.0 although this may be relatively higher than expected. Tends to have relatively higher readings after supper In 6/15 she was told to resume her Victoza and stop her Lantus since she had been gaining weight with Lantus and NovoLog regimen.   Recent history:     INSULIN regimen: Lantus 20 units before Bfst, Novolin mix 10 bid   Non-insulin hypoglycemic drugs: None   Her A1c was last 9% No recent fructosamine available  Current blood sugar patterns and management:  She has had much more variable blood sugars over the last month  She had been asked to increase her basal insulin to 30 units but more recently she started getting low and is now taking 20 units  Previously had a sample of [place] and now  starting Lantus in the morning  She was also recommended Humalog mix insulin but because of cost is now taking OTC Novolin 70/30  She does not always take her premixed insulin before meals causing some significantly high readings after her first meal and occasionally in the evening  Last night after taking her premixed insulin 2 hours after eating her blood sugar got low in the night  However she is checking her blood sugars much more consistently than before with her freestyle libre  Not clear if this is accurate since she does not correlate this with fingersticks    Side effects from medications:  none   Glucometer:  Freestyle Libre/Accu-Chek   CONTINUOUS GLUCOSE MONITORING RECORD INTERPRETATION    Dates of Recording: 4/7 through 4/20  Sensor description: [name]  Results statistics:   CGM use % of time  88  Average and SD  172 GV 45  Time in range    50    %  % Time Above 180  23  % Time above 250  17  % Time Below target  10    PRE-MEAL Fasting Lunch Dinner Bedtime Overall  Glucose range:       Mean/median:  145   167   172   POST-MEAL PC Breakfast PC Lunch PC Dinner  Glucose range:     Mean/median:  217   176    Glycemic patterns summary: Blood sugars show moderate variability at all times with the highest readings early afternoon between 12 noon-4 PM but not consistent As discussed below she has occasional low episodes also  Hyperglycemic episodes have occurred occasionally overnight, periodically midday or early afternoon and a couple of times late evening  Hypoglycemic episodes occurred overnight on 2 nights has also once before 6 PM and another time after 8 PM  Overnight periods: Blood sugars are quite variable but in the last 4 nights are mostly near normal or slightly low  Preprandial periods: Mealtimes are inconsistent and difficult to identify patterns  Postprandial periods:   Blood sugars are generally going up significantly around midday after her  first meal but only occasionally late evening   Previous data:      CGM use % of time  44  2-week average/SD  204, GV 35  Time in range  34     %, was 51  % Time Above 180  33  % Time above 250  29  % Time Below 70 4     PRE-MEAL Fasting Lunch Dinner Bedtime Overall  Glucose range:  224    Averages:  163     204   POST-MEAL PC Breakfast PC Lunch PC Dinner  Glucose range:     Averages:  213   276   Meals: 1-2 meals per day. Lunch 12 Supper at 5 PM            Physical activity: exercise: Limited, has had fatigue, back and leg pain.            Dietician visit: Most recent: 11/4233            Complications: are: None  Wt Readings from Last 3 Encounters:  05/29/20 289 lb (131.1 kg)  05/05/20 (!) 293 lb (132.9 kg)  04/27/20 (!) 294 lb (133.4 kg)    LABS:  Lab Results  Component Value Date   HGBA1C 8.3 (A) 04/27/2020   HGBA1C 8.1 (H) 01/26/2020   HGBA1C 9.0 (H) 12/27/2019   Lab Results  Component Value Date   MICROALBUR <0.7 04/22/2019   LDLCALC 105 (H) 01/26/2020   CREATININE 1.85 (H) 05/05/2020   Lab Results  Component Value Date   FRUCTOSAMINE 452 (H) 01/25/2020   FRUCTOSAMINE 401 (H) 10/20/2019   FRUCTOSAMINE 369 (H) 09/08/2019     Other active problems: See review of systems   No visits with results within 1 Week(s) from this visit.  Latest known visit with results is:  Lab on 05/05/2020  Component Date Value Ref Range Status  . WBC 05/05/2020 6.2  4.0 - 10.5 K/uL Final  . RBC 05/05/2020 3.30* 3.87 - 5.11 Mil/uL Final  . Hemoglobin 05/05/2020 10.4* 12.0 - 15.0 g/dL Final  . HCT 05/05/2020 32.5* 36 - 46 % Final  . MCV 05/05/2020 98.5  78.0 - 100.0 fl Final  . MCHC 05/05/2020 32.1  30.0 - 36.0 g/dL Final  . RDW 05/05/2020 14.2  11.5 - 15.5 % Final  . Platelets 05/05/2020 246.0  150 - 400 K/uL Final  . Neutrophils Relative % 05/05/2020 54.1  43 - 77 % Final  . Lymphocytes Relative 05/05/2020 34.1  12 - 46 % Final  . Monocytes Relative 05/05/2020  6.3  3 - 12 % Final  . Eosinophils Relative 05/05/2020 4.2  0 - 5 % Final  . Basophils Relative 05/05/2020 1.3  0 - 3 % Final  . Neutro Abs 05/05/2020 3.3  1.4 - 7.7 K/uL Final  . Lymphs Abs 05/05/2020 2.1  0.7 - 4.0 K/uL Final  . Monocytes Absolute 05/05/2020 0.4  0 - 1 K/uL Final  . Eosinophils Absolute 05/05/2020 0.3  0 - 0 K/uL Final  . Basophils Absolute 05/05/2020 0.1  0 - 0 K/uL Final  . Color, Urine 05/05/2020 YELLOW  Yellow;Lt. Yellow;Straw;Dark Yellow;Amber;Green;Red;Brown Final  . APPearance 05/05/2020 Sl Cloudy* Clear;Turbid;Slightly Cloudy;Cloudy Final  . Specific Gravity, Urine 05/05/2020 1.020  1.000 - 1.030 Final  . pH 05/05/2020 6.0  5.0 - 8.0 Final  . Total Protein, Urine 05/05/2020 NEGATIVE  Negative Final  . Urine Glucose 05/05/2020 100* Negative Final  . Ketones, ur 05/05/2020 NEGATIVE  Negative Final  . Bilirubin Urine 05/05/2020 NEGATIVE  Negative Final  . Hgb urine dipstick 05/05/2020 NEGATIVE  Negative Final  . Urobilinogen, UA 05/05/2020 0.2  0.0 - 1.0 Final  . Leukocytes,Ua 05/05/2020 NEGATIVE  Negative Final  . Nitrite 05/05/2020 NEGATIVE  Negative Final  . WBC, UA 05/05/2020 0-2/hpf  0-2/hpf Final  . RBC / HPF 05/05/2020 none seen  0-2/hpf Final  . Squamous Epithelial / LPF 05/05/2020 Few(5-10/hpf)* Rare(0-4/hpf) Final  .  Bacteria, UA 05/05/2020 Rare(<10/hpf)* None Final  . Clue Cells Wet Prep HPF POC 05/05/2020 Few(<6/hpf)* None;Present Final  . Lipase 05/05/2020 33.0  11 - 59 U/L Final  . Sodium 05/05/2020 137  135 - 145 mEq/L Final  . Potassium 05/05/2020 4.0  3.5 - 5.1 mEq/L Final  . Chloride 05/05/2020 105  96 - 112 mEq/L Final  . CO2 05/05/2020 25  19 - 32 mEq/L Final  . Glucose, Bld 05/05/2020 304* 70 - 99 mg/dL Final  . BUN 05/05/2020 28* 6 - 23 mg/dL Final  . Creatinine, Ser 05/05/2020 1.85* 0.40 - 1.20 mg/dL Final  . Total Bilirubin 05/05/2020 0.5  0.2 - 1.2 mg/dL Final  . Alkaline Phosphatase 05/05/2020 85  39 - 117 U/L Final  . AST  05/05/2020 17  0 - 37 U/L Final  . ALT 05/05/2020 22  0 - 35 U/L Final  . Total Protein 05/05/2020 7.3  6.0 - 8.3 g/dL Final  . Albumin 05/05/2020 3.9  3.5 - 5.2 g/dL Final  . GFR 05/05/2020 32.59* >60.00 mL/min Final  . Calcium 05/05/2020 9.3  8.4 - 10.5 mg/dL Final    Allergies as of 05/29/2020      Reactions   Ciprofloxacin Shortness Of Breath, Other (See Comments)   Dizziness   Hydrocodone Shortness Of Breath, Other (See Comments)   Tachycardia   Penicillins Hives   Pt just remembers hives Has patient had a PCN reaction causing immediate rash, facial/tongue/throat swelling, SOB or lightheadedness with hypotension: No Has patient had a PCN reaction causing severe rash involving mucus membranes or skin necrosis: No Has patient had a PCN reaction that required hospitalization No Has patient had a PCN reaction occurring within the last 10 years: No If all of the above answers are "NO", then may proceed with Cephalosporin use.      Medication List       Accurate as of May 29, 2020 11:59 PM. If you have any questions, ask your nurse or doctor.        aspirin 81 MG EC tablet Take 81 mg by mouth every evening.   atorvastatin 40 MG tablet Commonly known as: LIPITOR Take 1 tablet (40 mg total) by mouth at bedtime.   buPROPion 150 MG 24 hr tablet Commonly known as: WELLBUTRIN XL TAKE 1 TABLET BY MOUTH DAILY   chlorhexidine 0.12 % solution Commonly known as: PERIDEX See admin instructions.   cloNIDine 0.1 MG tablet Commonly known as: CATAPRES Take 1 tablet (0.1 mg total) by mouth 2 (two) times daily.   Elmiron 100 MG capsule Generic drug: pentosan polysulfate Take 200 mg by mouth 2 (two) times daily.   erythromycin 250 MG tablet Commonly known as: E-MYCIN Take 250 mg by mouth 4 (four) times daily.   FLUoxetine 20 MG capsule Commonly known as: PROZAC TAKE ONE CAPSULE BY MOUTH DAILY   FreeStyle Libre 14 Day Reader Kerrin Mo by Does not apply route.   gabapentin 300  MG capsule Commonly known as: NEURONTIN Take 300 mg by mouth 3 (three) times daily.   gabapentin 600 MG tablet Commonly known as: NEURONTIN Take 600 mg by mouth 3 (three) times daily.   glucose blood test strip Commonly known as: FREESTYLE TEST STRIPS Use as instructed to check blood sugar 4 times daily.   HYDROcodone-acetaminophen 5-325 MG tablet Commonly known as: NORCO/VICODIN Take 1 tablet by mouth 4 (four) times daily.   Insulin Lispro Prot & Lispro (75-25) 100 UNIT/ML Kwikpen Commonly known as: HumaLOG Mix 75/25 KwikPen  Inject 20 Units into the skin daily with breakfast. What changed:   how much to take  when to take this  additional instructions   isosorbide mononitrate 60 MG 24 hr tablet Commonly known as: IMDUR Take 1 tablet (60 mg total) by mouth daily.   Lantus SoloStar 100 UNIT/ML Solostar Pen Generic drug: insulin glargine Inject 18 Units into the skin daily.   levothyroxine 125 MCG tablet Commonly known as: SYNTHROID Take 1 tablet by mouth daily, Sunday-Friday. On Saturday, take 1.5 tablets.   linaclotide 72 MCG capsule Commonly known as: Linzess Take 1 capsule (72 mcg total) by mouth daily before breakfast.   losartan 100 MG tablet Commonly known as: COZAAR TAKE 1 TABLET BY MOUTH DAILY   metoprolol succinate 50 MG 24 hr tablet Commonly known as: TOPROL-XL TAKE 1 TABLET BY MOUTH DAILY WITH OR IMMEDIATELY FOLLOWING A MEAL   nitroGLYCERIN 0.4 MG SL tablet Commonly known as: NITROSTAT Place 1 tablet (0.4 mg total) under the tongue every 5 (five) minutes as needed for chest pain.   ondansetron 4 MG tablet Commonly known as: Zofran Take 1 tablet (4 mg total) by mouth every 8 (eight) hours as needed for nausea or vomiting.   pantoprazole 40 MG tablet Commonly known as: PROTONIX TAKE 1 TABLET(40 MG) BY MOUTH TWICE DAILY   phentermine 37.5 MG capsule Take 1 capsule (37.5 mg total) by mouth every morning.   polyethylene glycol 17 g  packet Commonly known as: MIRALAX / GLYCOLAX Take 17 g by mouth daily as needed.   tamsulosin 0.4 MG Caps capsule Commonly known as: FLOMAX Take 0.4 mg by mouth daily.   traMADol 50 MG tablet Commonly known as: ULTRAM Take 1 tablet (50 mg total) by mouth every 6 (six) hours as needed.       Allergies:  Allergies  Allergen Reactions  . Ciprofloxacin Shortness Of Breath and Other (See Comments)    Dizziness   . Hydrocodone Shortness Of Breath and Other (See Comments)    Tachycardia  . Penicillins Hives    Pt just remembers hives Has patient had a PCN reaction causing immediate rash, facial/tongue/throat swelling, SOB or lightheadedness with hypotension: No Has patient had a PCN reaction causing severe rash involving mucus membranes or skin necrosis: No Has patient had a PCN reaction that required hospitalization No Has patient had a PCN reaction occurring within the last 10 years: No If all of the above answers are "NO", then may proceed with Cephalosporin use.     Past Medical History:  Diagnosis Date  . Anxiety   . Arthritis   . Blood dyscrasia    "FREE BLEEDER"  . CERVICAL RADICULOPATHY, LEFT   . Chronic back pain   . CKD (chronic kidney disease)    DR. SANFORD  [facility]  . COMMON MIGRAINE   . CORONARY ARTERY DISEASE   . Cough    CURRENT COLD  . Cystitis   . DEPRESSION   . Diabetes mellitus, type II ([place])   . DIVERTICULOSIS-COLON   . Dysrhythmia    palpitations  . Gastric ulcer 04/2008  . Gastroparesis   . GERD (gastroesophageal reflux disease)   . Hiatal hernia   . Hyperlipidemia   . Hypertension   . Hypothyroidism   . INSOMNIA-SLEEP DISORDER-UNSPEC   . Iron deficiency anemia   . Wears glasses     Past Surgical History:  Procedure Laterality Date  . ABDOMINAL HYSTERECTOMY    . ANTERIOR LAT LUMBAR FUSION Left 08/13/2017  Procedure: LEFT SIDED LUMBAR 3-4 LATERAL INTERBODY FUSION WITH INSTRUMENTATION AND ALLOGRAFT;  Surgeon: Phylliss Bob,  MD;  Location: [place];  Service: Orthopedics;  Laterality: Left;  LEFT SIDED LUMBAR 3-4 LATERAL INTERBODY FUSION WITH INSTRUMENTATION AND ALLOGRAFT; REQUEST 3 HOURS  . Back Stimulator  07/2018  . BACK SURGERY  03/2016  . BREAST EXCISIONAL BIOPSY Right    40 years ago  . CHOLECYSTECTOMY  06/2009  . COLONOSCOPY    . ESOPHAGOGASTRODUODENOSCOPY    . EYE SURGERY Bilateral    lasik  . Gastric Wedge resection lipoma  11/2007   x2 with laparotomy and gastrostomy  . JOINT REPLACEMENT    . Left knee replacement    . Rigth knee replacement with revision  04/2008   Dr. Berenice Primas  . ROTATOR CUFF REPAIR Left 01/2009  . s/p bladder surgury  09/2009   Dr. Terance Hart  . SPINAL CORD STIMULATOR INSERTION N/A 09/11/2018   Procedure: LUMBAR SPINAL CORD STIMULATOR INSERTION;  Surgeon: Clydell Hakim, MD;  Location: [place];  Service: Neurosurgery;  Laterality: N/A;  LUMBAR SPINAL CORD STIMULATOR INSERTION    Family History  Problem Relation Age of Onset  . Diabetes Sister        x 3  . Heart disease Sister        x2  . Breast cancer Sister 71  . Diabetes Brother        x3  . Heart disease Brother        x2  . Coronary artery disease Other        female 1st degree  . Hypertension Other   . Breast cancer Sister 71  . Breast cancer Other 25  . Diabetes Brother   . Colon cancer Neg Hx     Social History:  reports that she has quit smoking. She has never used smokeless tobacco. She reports that she does not drink alcohol and does not use drugs.  Review of Systems:  Hypertension: Her blood pressure is treated with losartan 100 mg and clonidine 0.1 mg tablets, also on metoprolol  Followed by nephrologist and PCP  BP Readings from Last 3 Encounters:  05/29/20 (!) 160/82  05/05/20 (!) 164/80  04/27/20 (!) 138/78     CKD: Her creatinine is variable, followed by nephrologist Recent labs:  Lab Results  Component Value Date   CREATININE 1.85 (H) 05/05/2020   CREATININE 1.48 (H) 04/27/2020   CREATININE  1.78 (H) 01/25/2020   CREATININE 1.67 (H) 01/25/2020    Lipids: Followed by PCP, LDL has been variably controlled  Taking Lipitor 40 mg  Lipids are variable with no consistent control  Lab Results  Component Value Date   CHOL 175 01/26/2020   HDL 56 01/26/2020   LDLCALC 105 (H) 01/26/2020   LDLDIRECT 87.0 01/25/2020   TRIG 71 01/26/2020   CHOLHDL 3.1 01/26/2020     Post ablative hypothyroidism:  Previously she had I-131 treatment on 08/20/11 for Graves' disease  Her dose has been adjusted periodically  She has been taking 125 mg levothyroxine prescription more recently   TSH has fluctuated considerably this year and may be related to her not taking her doses as directed  Now taking 1 tablet daily except 1-1/2 on weekends   Lab Results  Component Value Date   TSH 0.72 04/27/2020   TSH 0.661 01/26/2020   TSH 0.42 01/25/2020   FREET4 0.81 09/08/2019   FREET4 0.95 06/21/2019   FREET4 0.86 04/22/2019    On Gabapentin 300 mg for  neuropathy  Asking about pain in the right upper abdomen, not acute and has not discussed with PCP.  Has history of cholecystectomy  She has been followed by podiatrist for ankle pain   Examination:   BP (!) 160/82 (BP Location: Left Arm, Patient Position: Sitting, Cuff Size: Large)   Pulse 73   Ht 5\' 6"  (1.676 m)   Wt 289 lb (131.1 kg)   BMI 46.65 kg/m   Body mass index is 46.65 kg/m.      ASSESSMENT/ PLAN:   Diabetes type 2,  with obesity:   See history of present illness for detailed discussion of current management, blood sugar patterns and problems identified  A1C was last 8.3  However recent freestyle libre average blood sugar is 270 and average blood sugar at any given time is at least 200 She has only 19% of her blood sugars within target range from inadequate insulin as discussed above  Recommendations for diabetes:  She was reminded to check blood sugars at least 3-4 times and on a daily basis to allow better  analysis of her freestyle [name]  Advised her to follow the instructions on the visit summary given for her insulin doses  Advised to take her morning insulin regardless of premeal blood sugar on waking up  For now will take 30 units in the morning of the Humalog mix and 10-20 units-at each meal based on her appetite  She will not take any premixed insulin at bedtime  Even if she is not eating she will need to take at least half of her insulin twice a day to prevent hypoglycemia especially the morning dose  She will reduce her Lantus from 18 down to 10 units in the morning and only go up with morning sugars are staying over 200  She will call if she has persistently high readings  If she has unexpected high blood sugars in the evenings after dinner she can take 8 to 10 units of NovoLog and not the mixed insulin and discussed the differences between the 2  Hypothyroidism: Well-controlled with current regimen and will check again on her next labs  HYPERTENSION: Stable follow-up with her PCP    Patient Instructions  Make sure you check your sugars when you wake up, before each meal and at bedtime  LANTUS insulin: Take only 10 units every morning. May increase the dose to 14 units only if your sugars are running over 200 every morning on waking up  HUMALOG MIX insulin: Take 30 units on waking up regardless of whether you eating or not. Also try to take 20 units of Humalog mix before evening meal If eating a light meal or very little still take 10 units around 9 PM every day  Do not take any HUMALOG mix at bedtime  In between if your blood sugars are high over 300 may take 8 to 10 units of NovoLog insulin which is quick acting      Elayne Snare 05/30/2020, 8:46 PM   Note: This office note was prepared with Dragon voice recognition system technology. Any transcriptional errors that result from this process are  unintentional.

## 2020-05-29 NOTE — Patient Instructions (Signed)
Make sure you check your sugars when you wake up, before each meal and at bedtime  LANTUS insulin: Take only 10 units every morning. May increase the dose to 14 units only if your sugars are running over 200 every morning on waking up  HUMALOG MIX insulin: Take 30 units on waking up regardless of whether you eating or not. Also try to take 20 units of Humalog mix before evening meal If eating a light meal or very little still take 10 units around 9 PM every day  Do not take any HUMALOG mix at bedtime  In between if your blood sugars are high over 300 may take 8 to 10 units of NovoLog insulin which is quick acting

## 2020-06-10 ENCOUNTER — Other Ambulatory Visit: Payer: Self-pay | Admitting: Internal Medicine

## 2020-06-10 NOTE — Telephone Encounter (Signed)
Please refill as per office routine med refill policy (all routine meds refilled for 3 mo or monthly per pt preference up to one year from last visit, then month to month grace period for 3 mo, then further med refills will have to be denied)  

## 2020-06-19 ENCOUNTER — Other Ambulatory Visit: Payer: Self-pay

## 2020-06-19 ENCOUNTER — Ambulatory Visit (INDEPENDENT_AMBULATORY_CARE_PROVIDER_SITE_OTHER): Payer: Medicare Other | Admitting: Podiatry

## 2020-06-19 DIAGNOSIS — M76821 Posterior tibial tendinitis, right leg: Secondary | ICD-10-CM

## 2020-06-19 DIAGNOSIS — M778 Other enthesopathies, not elsewhere classified: Secondary | ICD-10-CM | POA: Diagnosis not present

## 2020-06-19 DIAGNOSIS — M779 Enthesopathy, unspecified: Secondary | ICD-10-CM

## 2020-06-21 NOTE — Progress Notes (Signed)
Subjective:   Patient ID: [name], female   DOB: 69 y.o.   MRN: [id number]   HPI Patient presents stating that she is still having more discomfort in the joint of the right ankle and also on the left the inside of the ankle has become more sore and she is having trouble bearing her weight properly.  Obesity certainly a complicating factor in this patient   ROS      Objective:  Physical Exam  Neurovascular status intact with acute inflammation that continues to persist in the sinus tarsi right with fluid buildup of the joint surface and continued edema and inflammation of the posterior tibial tendon as it comes under the medial malleolus left with negative Bevelyn Buckles' sign noted or other pathology from that standpoint     Assessment:  Continuation of chronic foot pain with severe foot structural issues related to inherited foot structure along with obesity     Plan:  H&P reviewed both conditions.  For the right I did go ahead and I did do a sinus tarsi injection after sterile prep 3 mg Kenalog 5 mg Xylocaine.  For the left I went ahead and I did immobilize the posterior tibial tendon with air fracture walker to try to take all pressure off the area and I did discuss again the possibility for fusion of the subtalar midtarsal joint but given her age and obesity I think this would be a very difficult recovery procedure may continue to try to avoid it is best we can

## 2020-06-22 DIAGNOSIS — N3946 Mixed incontinence: Secondary | ICD-10-CM | POA: Diagnosis not present

## 2020-06-22 DIAGNOSIS — N3020 Other chronic cystitis without hematuria: Secondary | ICD-10-CM | POA: Diagnosis not present

## 2020-06-22 DIAGNOSIS — R3914 Feeling of incomplete bladder emptying: Secondary | ICD-10-CM | POA: Diagnosis not present

## 2020-06-22 DIAGNOSIS — N3010 Interstitial cystitis (chronic) without hematuria: Secondary | ICD-10-CM | POA: Diagnosis not present

## 2020-06-29 ENCOUNTER — Encounter: Payer: Medicare Other | Admitting: Obstetrics & Gynecology

## 2020-07-03 ENCOUNTER — Other Ambulatory Visit: Payer: Self-pay

## 2020-07-03 ENCOUNTER — Encounter: Payer: Self-pay | Admitting: Obstetrics & Gynecology

## 2020-07-03 ENCOUNTER — Ambulatory Visit (INDEPENDENT_AMBULATORY_CARE_PROVIDER_SITE_OTHER): Payer: Medicare Other | Admitting: Obstetrics & Gynecology

## 2020-07-03 VITALS — BP 158/82 | Ht 66.0 in | Wt 283.0 lb

## 2020-07-03 DIAGNOSIS — Z23 Encounter for immunization: Secondary | ICD-10-CM | POA: Diagnosis not present

## 2020-07-03 DIAGNOSIS — Z01419 Encounter for gynecological examination (general) (routine) without abnormal findings: Secondary | ICD-10-CM | POA: Diagnosis not present

## 2020-07-03 DIAGNOSIS — Z6841 Body Mass Index (BMI) 40.0 and over, adult: Secondary | ICD-10-CM

## 2020-07-03 DIAGNOSIS — Z90710 Acquired absence of both cervix and uterus: Secondary | ICD-10-CM

## 2020-07-03 DIAGNOSIS — Z780 Asymptomatic menopausal state: Secondary | ICD-10-CM

## 2020-07-03 NOTE — Progress Notes (Signed)
[name] 10/15/1950 [id number]   History:    69 y.o.  G3P3L3  RP:  Established patient presenting for annual gyn exam   HPI: Status post total hysterectomy.  Postmenopausal, well on no hormone replacement therapy.  No pelvic pain.  Abstinent.  Urine and bowel movements normal.  Followed by Uro Dr Matilde Sprang, probable kidney stones.  Breasts normal.  Body mass index decreased to 45.68.  Fasting health labs with family physician.  Past medical history,surgical history, family history and social history were all reviewed and documented in the EPIC chart.  Gynecologic History No LMP recorded. Patient has had a hysterectomy.  Obstetric History OB History  Gravida Para Term Preterm AB Living  3 3       3   SAB TAB Ectopic Multiple Live Births               # Outcome Date GA Lbr Len/2nd Weight Sex Delivery Anes PTL Lv  3 Para           2 Para           1 Para              ROS: A ROS was performed and pertinent positives and negatives are included in the history.  GENERAL: No fevers or chills. HEENT: No change in vision, no earache, sore throat or sinus congestion. NECK: No pain or stiffness. CARDIOVASCULAR: No chest pain or pressure. No palpitations. PULMONARY: No shortness of breath, cough or wheeze. GASTROINTESTINAL: No abdominal pain, nausea, vomiting or diarrhea, melena or bright red blood per rectum. GENITOURINARY: No urinary frequency, urgency, hesitancy or dysuria. MUSCULOSKELETAL: No joint or muscle pain, no back pain, no recent trauma. DERMATOLOGIC: No rash, no itching, no lesions. ENDOCRINE: No polyuria, polydipsia, no heat or cold intolerance. No recent change in weight. HEMATOLOGICAL: No anemia or easy bruising or bleeding. NEUROLOGIC: No headache, seizures, numbness, tingling or weakness. PSYCHIATRIC: No depression, no loss of interest in normal activity or change in sleep pattern.     Exam:   BP (!) 158/82 (BP Location: Right Arm, Patient Position: Sitting, Cuff  Size: Large)   Ht 5\' 6"  (1.676 m)   Wt 283 lb (128.4 kg)   BMI 45.68 kg/m   Body mass index is 45.68 kg/m.  General appearance : Well developed well nourished female. No acute distress HEENT: Eyes: no retinal hemorrhage or exudates,  Neck supple, trachea midline, no carotid bruits, no thyroidmegaly Lungs: Clear to auscultation, no rhonchi or wheezes, or rib retractions  Heart: Regular rate and rhythm, no murmurs or gallops Breast:Examined in sitting and supine position were symmetrical in appearance, no palpable masses or tenderness,  no skin retraction, no nipple inversion, no nipple discharge, no skin discoloration, no axillary or supraclavicular lymphadenopathy Abdomen: no palpable masses or tenderness, no rebound or guarding Extremities: no edema or skin discoloration or tenderness  Pelvic: Vulva: Normal             Vagina: No gross lesions or discharge  Cervix/Uterus absent  Adnexa  Without masses or tenderness  Anus: Normal   Assessment/Plan:  69 y.o. female for annual exam   1. Well female exam with routine gynecological exam Gynecologic exam status post total hysterectomy.  No indication for Pap test this year.  Breast exam normal.  Last screening mammogram October 2020 was negative.  Health labs with family physician.   2. S/P total hysterectomy  3. Postmenopausal Well on no HRT.  BD 08/2015 Normal.  Will repeat next year.  Vitamin D supplements, calcium intake of 1500 mg daily and regular weightbearing physical activity is recommended.  4. Class 3 severe obesity due to excess calories with serious comorbidity and body mass index (BMI) of 45.0 to 49.9 in adult [facility]) Recommend a lower calorie/carb diet.  Aerobic activities 5 times a week and light weightlifting every 2 days.  [name] MD, 2:56 PM 07/03/2020

## 2020-07-04 ENCOUNTER — Other Ambulatory Visit: Payer: Self-pay | Admitting: Endocrinology

## 2020-07-05 ENCOUNTER — Other Ambulatory Visit: Payer: Medicare Other

## 2020-07-05 ENCOUNTER — Encounter: Payer: Self-pay | Admitting: Obstetrics & Gynecology

## 2020-07-06 ENCOUNTER — Other Ambulatory Visit (INDEPENDENT_AMBULATORY_CARE_PROVIDER_SITE_OTHER): Payer: Medicare Other

## 2020-07-06 DIAGNOSIS — E890 Postprocedural hypothyroidism: Secondary | ICD-10-CM | POA: Diagnosis not present

## 2020-07-06 DIAGNOSIS — Z794 Long term (current) use of insulin: Secondary | ICD-10-CM

## 2020-07-06 DIAGNOSIS — E1165 Type 2 diabetes mellitus with hyperglycemia: Secondary | ICD-10-CM

## 2020-07-06 LAB — MICROALBUMIN / CREATININE URINE RATIO
Creatinine,U: 169.3 mg/dL
Microalb Creat Ratio: 0.9 mg/g (ref 0.0–30.0)
Microalb, Ur: 1.5 mg/dL (ref 0.0–1.9)

## 2020-07-06 LAB — HEMOGLOBIN A1C: Hgb A1c MFr Bld: 9.4 % — ABNORMAL HIGH (ref 4.6–6.5)

## 2020-07-06 LAB — BASIC METABOLIC PANEL
BUN: 38 mg/dL — ABNORMAL HIGH (ref 6–23)
CO2: 23 mEq/L (ref 19–32)
Calcium: 9 mg/dL (ref 8.4–10.5)
Chloride: 101 mEq/L (ref 96–112)
Creatinine, Ser: 2.17 mg/dL — ABNORMAL HIGH (ref 0.40–1.20)
GFR: 27.1 mL/min — ABNORMAL LOW (ref >60.00)
Glucose, Bld: 484 mg/dL — ABNORMAL HIGH (ref 70–99)
Potassium: 4.5 mEq/L (ref 3.5–5.1)
Sodium: 132 mEq/L — ABNORMAL LOW (ref 135–145)

## 2020-07-06 LAB — TSH: TSH: 4.21 u[IU]/mL (ref 0.35–4.50)

## 2020-07-06 LAB — T4, FREE: Free T4: 0.63 ng/dL (ref 0.60–1.60)

## 2020-07-10 ENCOUNTER — Other Ambulatory Visit: Payer: Self-pay

## 2020-07-10 ENCOUNTER — Encounter: Payer: Medicare Other | Admitting: Endocrinology

## 2020-07-10 NOTE — Progress Notes (Signed)
This encounter was created in error - please disregard.

## 2020-07-13 ENCOUNTER — Other Ambulatory Visit: Payer: Self-pay

## 2020-07-13 ENCOUNTER — Ambulatory Visit (INDEPENDENT_AMBULATORY_CARE_PROVIDER_SITE_OTHER): Payer: Medicare Other | Admitting: Endocrinology

## 2020-07-13 ENCOUNTER — Encounter: Payer: Self-pay | Admitting: Endocrinology

## 2020-07-13 VITALS — BP 138/84 | HR 89 | Wt 286.0 lb

## 2020-07-13 DIAGNOSIS — E1165 Type 2 diabetes mellitus with hyperglycemia: Secondary | ICD-10-CM

## 2020-07-13 DIAGNOSIS — Z794 Long term (current) use of insulin: Secondary | ICD-10-CM

## 2020-07-13 DIAGNOSIS — E890 Postprocedural hypothyroidism: Secondary | ICD-10-CM | POA: Diagnosis not present

## 2020-07-13 MED ORDER — NOVOLIN 70/30 FLEXPEN RELION (70-30) 100 UNIT/ML ~~LOC~~ SUPN: PEN_INJECTOR | SUBCUTANEOUS | 1 refills | Status: DC

## 2020-07-13 NOTE — Progress Notes (Signed)
Sherry RACETTE is a 69 y.o. female.    Reason for Appointment: follow-up   History of Present Illness   Diagnosis: Type 2 DIABETES MELITUS, date of diagnosis: 2000  Previous history: She had initially been treated with metformin and Amaryl and subsequently given Victoza which helped overall control and weight loss. In 9/13 because of significant hyperglycemia she was started on basal bolus insulin also. Had required relatively small doses of insulin. Her blood sugar control has been excellent with A1c between 5.3-6.0 although this may be relatively higher than expected. Tends to have relatively higher readings after supper In 6/15 she was told to resume her Victoza and stop her Lantus since she had been gaining weight with Lantus and NovoLog regimen.   Recent history:     INSULIN regimen: Lantus 10 units before breakfast, Humalog 10 units before first meal mostly   Non-insulin hypoglycemic drugs: None   A1c is 9.4 compared to 8.3  Fructosamine previously 452  Current blood sugar patterns and management:  She has much higher blood sugars since her last visit and not clear why  She did get a steroid injection in her ankle but her blood sugars are still appearing to be nearly 400 all the time  With her freestyle [name] appears that her sugars are consistently near 350 all day and night except once  She was supposed to be taking HUMALOG mix but she only has a pen for the Humalog U-200, likely a sample that she is sometimes using  She now says that she is mostly eating 1 meal a day around noontime and has not had any food today even though she is here in the afternoon  Even without any intake today her blood sugar in the office is over 400  Despite having the freestyle libre she is checking the blood sugars only every 2 or 3 days and only once on those days, has not checked it for 2 days  Right now she has her sensor on the distal outside part of the upper arm which is  difficult to access for her  She does not think she is excessively thirsty and is only drinking water when she has fluids  Her weight is about the same    Side effects from medications:  none   Glucometer:  Freestyle Libre/Accu-Chek  CGM use % of time  31  2-week average/SD  382  Time in range        %  % Time Above 180   % Time above 250  98  % Time Below 70    Previously:   CGM use % of time  62  2-week average/SD  270  Time in range     19   %  % Time Above 180  21  % Time above 250  58  % Time Below 70 2     PRE-MEAL Fasting Lunch Dinner Bedtime Overall  Glucose range:       Averages:  204   312   270   POST-MEAL PC Breakfast PC Lunch PC Dinner  Glucose range:     Averages:  301   316    Previous data:   CGM use % of time  68  2-week average/SD  233  Time in range       21%  % Time Above 180  22  % Time above 250  48  % Time Below 70 9     PRE-MEAL Fasting  Lunch Dinner Bedtime Overall  Glucose range:       Averages:  178   295  287    POST-MEAL PC Breakfast PC Lunch PC Dinner  Glucose range:     Averages:  275   303    Meals: 1-2 meals per day. Lunch 12 Supper at 6-8 PM            Physical activity: exercise: Limited, has had fatigue, back and leg pain.            Dietician visit: Most recent: 0/[id number]            Complications: are: None  Wt Readings from Last 3 Encounters:  07/13/20 286 lb (129.7 kg)  07/03/20 283 lb (128.4 kg)  05/29/20 289 lb (131.1 kg)    LABS:  Lab Results  Component Value Date   HGBA1C 9.4 (H) 07/06/2020   HGBA1C 8.3 (A) 04/27/2020   HGBA1C 8.1 (H) 01/26/2020   Lab Results  Component Value Date   MICROALBUR 1.5 07/06/2020   LDLCALC 105 (H) 01/26/2020   CREATININE 2.17 (H) 07/06/2020   Lab Results  Component Value Date   FRUCTOSAMINE 452 (H) 01/25/2020   FRUCTOSAMINE 401 (H) 10/20/2019   FRUCTOSAMINE 369 (H) 09/08/2019     Other active problems: See review of systems   No visits with results within  1 Week(s) from this visit.  Latest known visit with results is:  Appointment on 07/06/2020  Component Date Value Ref Range Status  . Free T4 07/06/2020 0.63  0.60 - 1.60 ng/dL Final   Comment: Specimens from patients who are undergoing biotin therapy and /or ingesting biotin supplements may contain high levels of biotin.  The higher biotin concentration in these specimens interferes with this Free T4 assay.  Specimens that contain high levels  of biotin may cause false high results for this Free T4 assay.  Please interpret results in light of the total clinical presentation of the patient.    Marland Kitchen TSH 07/06/2020 4.21  0.35 - 4.50 uIU/mL Final  . Sodium 07/06/2020 132* 135 - 145 mEq/L Final  . Potassium 07/06/2020 4.5  3.5 - 5.1 mEq/L Final  . Chloride 07/06/2020 101  96 - 112 mEq/L Final  . CO2 07/06/2020 23  19 - 32 mEq/L Final  . Glucose, Bld 07/06/2020 484* 70 - 99 mg/dL Final  . BUN 07/06/2020 38* 6 - 23 mg/dL Final  . Creatinine, Ser 07/06/2020 2.17* 0.40 - 1.20 mg/dL Final  . GFR 07/06/2020 27.10* >60.00 mL/min Final  . Calcium 07/06/2020 9.0  8.4 - 10.5 mg/dL Final  . Hgb A1c MFr Bld 07/06/2020 9.4* 4.6 - 6.5 % Final   Glycemic Control Guidelines for People with Diabetes:Non Diabetic:  <6%Goal of Therapy: <7%Additional Action Suggested:  >8%   . Microalb, Ur 07/06/2020 1.5  0.0 - 1.9 mg/dL Final  . Creatinine,U 07/06/2020 169.3  mg/dL Final  . Microalb Creat Ratio 07/06/2020 0.9  0.0 - 30.0 mg/g Final    Allergies as of 07/13/2020      Reactions   Ciprofloxacin Shortness Of Breath, Other (See Comments)   Dizziness   Hydrocodone Shortness Of Breath, Other (See Comments)   Tachycardia   Penicillins Hives   Pt just remembers hives Has patient had a PCN reaction causing immediate rash, facial/tongue/throat swelling, SOB or lightheadedness with hypotension: No Has patient had a PCN reaction causing severe rash involving mucus membranes or skin necrosis: No Has patient had a PCN  reaction that required  hospitalization No Has patient had a PCN reaction occurring within the last 10 years: No If all of the above answers are "NO", then may proceed with Cephalosporin use.      Medication List       Accurate as of July 13, 2020  4:44 PM. If you have any questions, ask your nurse or doctor.        STOP taking these medications   Insulin Lispro Prot & Lispro (75-25) 100 UNIT/ML Kwikpen Commonly known as: HumaLOG Mix 75/25 KwikPen Stopped by: Elayne Snare, MD     TAKE these medications   aspirin 81 MG EC tablet Take 81 mg by mouth every evening.   atorvastatin 40 MG tablet Commonly known as: LIPITOR TAKE ONE TABLET BY MOUTH EVERY NIGHT AT BEDTIME   buPROPion 150 MG 24 hr tablet Commonly known as: WELLBUTRIN XL TAKE 1 TABLET BY MOUTH DAILY   chlorhexidine 0.12 % solution Commonly known as: PERIDEX See admin instructions.   cloNIDine 0.1 MG tablet Commonly known as: CATAPRES Take 1 tablet (0.1 mg total) by mouth 2 (two) times daily.   Elmiron 100 MG capsule Generic drug: pentosan polysulfate Take 200 mg by mouth 2 (two) times daily.   erythromycin 250 MG tablet Commonly known as: E-MYCIN Take 250 mg by mouth 4 (four) times daily.   FLUoxetine 20 MG capsule Commonly known as: PROZAC TAKE ONE CAPSULE BY MOUTH DAILY   FreeStyle Libre 14 Day Reader Kerrin Mo by Does not apply route.   gabapentin 300 MG capsule Commonly known as: NEURONTIN Take 300 mg by mouth 3 (three) times daily.   gabapentin 600 MG tablet Commonly known as: NEURONTIN Take 600 mg by mouth 3 (three) times daily.   glucose blood test strip Commonly known as: FREESTYLE TEST STRIPS Use as instructed to check blood sugar 4 times daily.   HYDROcodone-acetaminophen 5-325 MG tablet Commonly known as: NORCO/VICODIN Take 1 tablet by mouth 4 (four) times daily.   isosorbide mononitrate 60 MG 24 hr tablet Commonly known as: IMDUR Take 1 tablet (60 mg total) by mouth daily.   Lantus  SoloStar 100 UNIT/ML Solostar Pen Generic drug: insulin glargine Inject 18 Units into the skin daily.   levothyroxine 125 MCG tablet Commonly known as: SYNTHROID Take 1 tablet by mouth daily, Sunday-Friday. On Saturday, take 1.5 tablets.   linaclotide 72 MCG capsule Commonly known as: Linzess Take 1 capsule (72 mcg total) by mouth daily before breakfast.   losartan 100 MG tablet Commonly known as: COZAAR TAKE 1 TABLET BY MOUTH DAILY   metoprolol succinate 50 MG 24 hr tablet Commonly known as: TOPROL-XL TAKE 1 TABLET BY MOUTH DAILY WITH OR IMMEDIATELY FOLLOWING A MEAL   nitroGLYCERIN 0.4 MG SL tablet Commonly known as: NITROSTAT Place 1 tablet (0.4 mg total) under the tongue every 5 (five) minutes as needed for chest pain.   NovoLIN 70/30 FlexPen Relion (70-30) 100 UNIT/ML KwikPen Generic drug: insulin isophane & regular human 30 units on waking up and 10 units at dinnertime, increase as directed, maximum dose 60 units daily Started by: Elayne Snare, MD   ondansetron 4 MG tablet Commonly known as: Zofran Take 1 tablet (4 mg total) by mouth every 8 (eight) hours as needed for nausea or vomiting.   pantoprazole 40 MG tablet Commonly known as: PROTONIX TAKE 1 TABLET(40 MG) BY MOUTH TWICE DAILY   phentermine 37.5 MG capsule Take 1 capsule (37.5 mg total) by mouth every morning.   polyethylene glycol 17 g packet Commonly  known as: MIRALAX / GLYCOLAX Take 17 g by mouth daily as needed.   tamsulosin 0.4 MG Caps capsule Commonly known as: FLOMAX Take 0.4 mg by mouth daily.   traMADol 50 MG tablet Commonly known as: ULTRAM Take 1 tablet (50 mg total) by mouth every 6 (six) hours as needed.       Allergies:  Allergies  Allergen Reactions  . Ciprofloxacin Shortness Of Breath and Other (See Comments)    Dizziness   . Hydrocodone Shortness Of Breath and Other (See Comments)    Tachycardia  . Penicillins Hives    Pt just remembers hives Has patient had a PCN reaction  causing immediate rash, facial/tongue/throat swelling, SOB or lightheadedness with hypotension: No Has patient had a PCN reaction causing severe rash involving mucus membranes or skin necrosis: No Has patient had a PCN reaction that required hospitalization No Has patient had a PCN reaction occurring within the last 10 years: No If all of the above answers are "NO", then may proceed with Cephalosporin use.     Past Medical History:  Diagnosis Date  . Anxiety   . Arthritis   . Blood dyscrasia    "FREE BLEEDER"  . CERVICAL RADICULOPATHY, LEFT   . Chronic back pain   . CKD (chronic kidney disease)    DR. SANFORD  [facility]  . COMMON MIGRAINE   . CORONARY ARTERY DISEASE   . Cough    CURRENT COLD  . Cystitis   . DEPRESSION   . Diabetes mellitus, type II ([place])   . DIVERTICULOSIS-COLON   . Dysrhythmia    palpitations  . Gastric ulcer 04/2008  . Gastroparesis   . GERD (gastroesophageal reflux disease)   . Hiatal hernia   . Hyperlipidemia   . Hypertension   . Hypothyroidism   . INSOMNIA-SLEEP DISORDER-UNSPEC   . Iron deficiency anemia   . Wears glasses     Past Surgical History:  Procedure Laterality Date  . ABDOMINAL HYSTERECTOMY    . ANTERIOR LAT LUMBAR FUSION Left 08/13/2017   Procedure: LEFT SIDED LUMBAR 3-4 LATERAL INTERBODY FUSION WITH INSTRUMENTATION AND ALLOGRAFT;  Surgeon: Phylliss Bob, MD;  Location: [facility];  Service: Orthopedics;  Laterality: Left;  LEFT SIDED LUMBAR 3-4 LATERAL INTERBODY FUSION WITH INSTRUMENTATION AND ALLOGRAFT; REQUEST 3 HOURS  . Back Stimulator  07/2018  . BACK SURGERY  03/2016  . BREAST EXCISIONAL BIOPSY Right    40 years ago  . CHOLECYSTECTOMY  06/2009  . COLONOSCOPY    . ESOPHAGOGASTRODUODENOSCOPY    . EYE SURGERY Bilateral    lasik  . Gastric Wedge resection lipoma  11/2007   x2 with laparotomy and gastrostomy  . JOINT REPLACEMENT    . Left knee replacement    . Rigth knee replacement with revision  04/2008   Dr. Berenice Primas  .  ROTATOR CUFF REPAIR Left 01/2009  . s/p bladder surgury  09/2009   Dr. Terance Hart  . SPINAL CORD STIMULATOR INSERTION N/A 09/11/2018   Procedure: LUMBAR SPINAL CORD STIMULATOR INSERTION;  Surgeon: Clydell Hakim, MD;  Location: [facility];  Service: Neurosurgery;  Laterality: N/A;  LUMBAR SPINAL CORD STIMULATOR INSERTION    Family History  Problem Relation Age of Onset  . Diabetes Sister        x 3  . Heart disease Sister        x2  . Breast cancer Sister 27  . Diabetes Brother        x3  . Heart disease Brother  x2  . Coronary artery disease Other        female 1st degree  . Hypertension Other   . Breast cancer Sister 45  . Breast cancer Other 25  . Diabetes Brother   . Colon cancer Neg Hx     Social History:  reports that she has quit smoking. She has never used smokeless tobacco. She reports that she does not drink alcohol and does not use drugs.  Review of Systems:  Hypertension: Her blood pressure is treated with losartan 100 mg and clonidine 0.1 mg tablets, also on metoprolol  Followed by nephrologist and PCP  BP Readings from Last 3 Encounters:  07/13/20 138/84  07/10/20 (!) 164/86  07/03/20 (!) 158/82     CKD: Her creatinine is variable, followed by nephrologist Creatinine is higher as of 3/22  Lab Results  Component Value Date   CREATININE 2.17 (H) 07/06/2020   CREATININE 1.85 (H) 05/05/2020   CREATININE 1.48 (H) 04/27/2020   CREATININE 1.78 (H) 01/25/2020   Lab Results  Component Value Date   K 4.5 07/06/2020     Lipids: Followed by PCP, LDL has been variably controlled  Taking Lipitor 40 mg  Her lipids are much worse, does also have significant hypothyroidism on this visit  Lab Results  Component Value Date   CHOL 175 01/26/2020   HDL 56 01/26/2020   LDLCALC 105 (H) 01/26/2020   LDLDIRECT 87.0 01/25/2020   TRIG 71 01/26/2020   CHOLHDL 3.1 01/26/2020     Post ablative hypothyroidism:  Previously she had I-131 treatment on 08/20/11 for  Graves' disease  Her dose has been adjusted periodically  She has been taking 125 mg levothyroxine prescription consistently and has been taking 1-1/2 on Saturdays and 1 pill on the other days   TSH was low normal previously and sugars told to reduce her regimen by half tablet weekly However for some reason her TSH was much higher in March and she was significantly symptomatic with fatigue Also was having some vomiting at that time  Now she is taking 1-1/2 tablets daily of the 125 mcg dose and has taken this consistently the mornings  Lab Results  Component Value Date   TSH 4.21 07/06/2020   TSH 0.72 04/27/2020   TSH 0.661 01/26/2020   FREET4 0.63 07/06/2020   FREET4 0.81 09/08/2019   FREET4 0.95 06/21/2019    On Gabapentin 300 mg for neuropathy    Examination:   BP 138/84   Pulse 89   Wt 286 lb (129.7 kg)   SpO2 98%   BMI 46.16 kg/m   Body mass index is 46.16 kg/m.   No ankle edema evident   ASSESSMENT/ PLAN:   Diabetes type 2,  with obesity:   See history of present illness for detailed discussion of current management, blood sugar patterns and problems identified  A1C is recently 9%  Even though she had appeared to require a lot of insulin on her last visit and she was recommended a total of 90 units daily her blood sugars are much lower in the last few days This is despite taking only a total of 40 units of insulin a day May be benefiting from using premixed insulin instead of only basal and bolus  Her blood sugars still tend to be highest midday and afternoon but this is partly related to her not taking her premixed insulin before eating Fasting readings are still fairly good with 20 units of Lantus despite not using [place] She  may have had some nausea with [name] which is resolving  Recommendations for diabetes:  She was advised to check blood sugars consistently at all different time Emphasized the need to take her premixed insulin well before  starting to eat and she needs to tell her husband to remind her to do so; also advised her that late administration of her premixed insulin may cause nocturnal hypoglycemia She will also need to reduce Lantus to 18 units to prevent overnight hypoglycemia Blood sugar targets were discussed She will try to avoid high fat or high carbohydrate meals  Hypothyroidism: Taking 125 mcg, 1-1/2 tablets daily because of very high TSH Not clear if she was having some vomiting problem gastroparesis causing inadequate absorption Labs need to be checked again  Hypertension/renal insufficiency: Losartan was stopped and needs follow-up labs Blood pressure is not as low    Patient Instructions  LANTUS 20 UNITS IN AM AND AFTER 3 DAYS if sugars in am are > 200 then go to 25  NOVOLIN 70/30: take 30 units on waking up and if still over 200 at 6 pm in 3 days go to 40 units daily Also take 10 units at 6 pm daily if STILL 200  HUMALOG :TAKE 18 UNITS WHEN EATING LUNCH  Check sugars 4x daily with sensor      Elayne Snare 07/13/2020, 4:44 PM   Note: This office note was prepared with Dragon voice recognition system technology. Any transcriptional errors that result from this process are unintentional.                                           [name] is a 69 y.o. female.    Reason for Appointment: follow-up   History of Present Illness   Diagnosis: Type 2 DIABETES MELITUS, date of diagnosis: 2000  Previous history: She had initially been treated with metformin and Amaryl and subsequently given Victoza which helped overall control and weight loss. In 9/13 because of significant hyperglycemia she was started on basal bolus insulin also. Had required relatively small doses of insulin. Her blood sugar control has been excellent with A1c between 5.3-6.0 although this may be relatively higher than expected. Tends to have relatively higher readings after supper In 6/15  she was told to resume her Victoza and stop her Lantus since she had been gaining weight with Lantus and NovoLog regimen.   Recent history:     INSULIN regimen: Lantus 20 units before Bfst, Novolin mix 10 bid   Non-insulin hypoglycemic drugs: None   Her A1c was last 9% No recent fructosamine available  Current blood sugar patterns and management:  She has had much more variable blood sugars over the last month  She had been asked to increase her basal insulin to 30 units but more recently she started getting low and is now taking 20 units  Previously had a sample of [place] and now starting Lantus in the morning  She was also recommended Humalog mix insulin but because of cost is now taking OTC Novolin 70/30  She does not always take her premixed insulin before meals causing some significantly high readings after her first meal and occasionally in the evening  Last night after taking her premixed insulin 2 hours after eating her blood sugar got low in the night  However she is checking her blood sugars much more consistently than before with  her freestyle libre  Not clear if this is accurate since she does not correlate this with fingersticks    Side effects from medications:  none   Glucometer:  Freestyle Libre/Accu-Chek   CONTINUOUS GLUCOSE MONITORING RECORD INTERPRETATION    Dates of Recording: 4/7 through 4/20  Sensor description: [employer]  Results statistics:   CGM use % of time  88  Average and SD  172 GV 45  Time in range    50    %  % Time Above 180  23  % Time above 250  17  % Time Below target  10    PRE-MEAL Fasting Lunch Dinner Bedtime Overall  Glucose range:       Mean/median:  145   167   172   POST-MEAL PC Breakfast PC Lunch PC Dinner  Glucose range:     Mean/median:  217   176    Glycemic patterns summary: Blood sugars show moderate variability at all times with the highest readings early afternoon between 12 noon-4 PM but not consistent As  discussed below she has occasional low episodes also  Hyperglycemic episodes have occurred occasionally overnight, periodically midday or early afternoon and a couple of times late evening  Hypoglycemic episodes occurred overnight on 2 nights has also once before 6 PM and another time after 8 PM  Overnight periods: Blood sugars are quite variable but in the last 4 nights are mostly near normal or slightly low  Preprandial periods: Mealtimes are inconsistent and difficult to identify patterns  Postprandial periods:   Blood sugars are generally going up significantly around midday after her first meal but only occasionally late evening   Previous data:      CGM use % of time  44  2-week average/SD  204, GV 35  Time in range  34     %, was 51  % Time Above 180  33  % Time above 250  29  % Time Below 70 4     PRE-MEAL Fasting Lunch Dinner Bedtime Overall  Glucose range:    224    Averages:  163     204   POST-MEAL PC Breakfast PC Lunch PC Dinner  Glucose range:     Averages:  213   276   Meals: 1-2 meals per day. Lunch 12 Supper at 5 PM            Physical activity: exercise: Limited, has had fatigue, back and leg pain.            Dietician visit: Most recent: 12/5007            Complications: are: None  Wt Readings from Last 3 Encounters:  07/13/20 286 lb (129.7 kg)  07/03/20 283 lb (128.4 kg)  05/29/20 289 lb (131.1 kg)    LABS:  Lab Results  Component Value Date   HGBA1C 9.4 (H) 07/06/2020   HGBA1C 8.3 (A) 04/27/2020   HGBA1C 8.1 (H) 01/26/2020   Lab Results  Component Value Date   MICROALBUR 1.5 07/06/2020   LDLCALC 105 (H) 01/26/2020   CREATININE 2.17 (H) 07/06/2020   Lab Results  Component Value Date   FRUCTOSAMINE 452 (H) 01/25/2020   FRUCTOSAMINE 401 (H) 10/20/2019   FRUCTOSAMINE 369 (H) 09/08/2019     Other active problems: See review of systems   No visits with results within 1 Week(s) from this visit.  Latest known visit with results is:   Appointment on 07/06/2020  Component Date  Value Ref Range Status  . Free T4 07/06/2020 0.63  0.60 - 1.60 ng/dL Final   Comment: Specimens from patients who are undergoing biotin therapy and /or ingesting biotin supplements may contain high levels of biotin.  The higher biotin concentration in these specimens interferes with this Free T4 assay.  Specimens that contain high levels  of biotin may cause false high results for this Free T4 assay.  Please interpret results in light of the total clinical presentation of the patient.    Marland Kitchen TSH 07/06/2020 4.21  0.35 - 4.50 uIU/mL Final  . Sodium 07/06/2020 132* 135 - 145 mEq/L Final  . Potassium 07/06/2020 4.5  3.5 - 5.1 mEq/L Final  . Chloride 07/06/2020 101  96 - 112 mEq/L Final  . CO2 07/06/2020 23  19 - 32 mEq/L Final  . Glucose, Bld 07/06/2020 484* 70 - 99 mg/dL Final  . BUN 07/06/2020 38* 6 - 23 mg/dL Final  . Creatinine, Ser 07/06/2020 2.17* 0.40 - 1.20 mg/dL Final  . GFR 07/06/2020 27.10* >60.00 mL/min Final  . Calcium 07/06/2020 9.0  8.4 - 10.5 mg/dL Final  . Hgb A1c MFr Bld 07/06/2020 9.4* 4.6 - 6.5 % Final   Glycemic Control Guidelines for People with Diabetes:Non Diabetic:  <6%Goal of Therapy: <7%Additional Action Suggested:  >8%   . Microalb, Ur 07/06/2020 1.5  0.0 - 1.9 mg/dL Final  . Creatinine,U 07/06/2020 169.3  mg/dL Final  . Microalb Creat Ratio 07/06/2020 0.9  0.0 - 30.0 mg/g Final    Allergies as of 07/13/2020      Reactions   Ciprofloxacin Shortness Of Breath, Other (See Comments)   Dizziness   Hydrocodone Shortness Of Breath, Other (See Comments)   Tachycardia   Penicillins Hives   Pt just remembers hives Has patient had a PCN reaction causing immediate rash, facial/tongue/throat swelling, SOB or lightheadedness with hypotension: No Has patient had a PCN reaction causing severe rash involving mucus membranes or skin necrosis: No Has patient had a PCN reaction that required hospitalization No Has patient had a PCN  reaction occurring within the last 10 years: No If all of the above answers are "NO", then may proceed with Cephalosporin use.      Medication List       Accurate as of July 13, 2020  4:44 PM. If you have any questions, ask your nurse or doctor.        STOP taking these medications   Insulin Lispro Prot & Lispro (75-25) 100 UNIT/ML Kwikpen Commonly known as: HumaLOG Mix 75/25 KwikPen Stopped by: Elayne Snare, MD     TAKE these medications   aspirin 81 MG EC tablet Take 81 mg by mouth every evening.   atorvastatin 40 MG tablet Commonly known as: LIPITOR TAKE ONE TABLET BY MOUTH EVERY NIGHT AT BEDTIME   buPROPion 150 MG 24 hr tablet Commonly known as: WELLBUTRIN XL TAKE 1 TABLET BY MOUTH DAILY   chlorhexidine 0.12 % solution Commonly known as: PERIDEX See admin instructions.   cloNIDine 0.1 MG tablet Commonly known as: CATAPRES Take 1 tablet (0.1 mg total) by mouth 2 (two) times daily.   Elmiron 100 MG capsule Generic drug: pentosan polysulfate Take 200 mg by mouth 2 (two) times daily.   erythromycin 250 MG tablet Commonly known as: E-MYCIN Take 250 mg by mouth 4 (four) times daily.   FLUoxetine 20 MG capsule Commonly known as: PROZAC TAKE ONE CAPSULE BY MOUTH DAILY   FreeStyle Libre 14 Day Reader Kerrin Mo by Does  not apply route.   gabapentin 300 MG capsule Commonly known as: NEURONTIN Take 300 mg by mouth 3 (three) times daily.   gabapentin 600 MG tablet Commonly known as: NEURONTIN Take 600 mg by mouth 3 (three) times daily.   glucose blood test strip Commonly known as: FREESTYLE TEST STRIPS Use as instructed to check blood sugar 4 times daily.   HYDROcodone-acetaminophen 5-325 MG tablet Commonly known as: NORCO/VICODIN Take 1 tablet by mouth 4 (four) times daily.   isosorbide mononitrate 60 MG 24 hr tablet Commonly known as: IMDUR Take 1 tablet (60 mg total) by mouth daily.   Lantus SoloStar 100 UNIT/ML Solostar Pen Generic drug: insulin glargine  Inject 18 Units into the skin daily.   levothyroxine 125 MCG tablet Commonly known as: SYNTHROID Take 1 tablet by mouth daily, Sunday-Friday. On Saturday, take 1.5 tablets.   linaclotide 72 MCG capsule Commonly known as: Linzess Take 1 capsule (72 mcg total) by mouth daily before breakfast.   losartan 100 MG tablet Commonly known as: COZAAR TAKE 1 TABLET BY MOUTH DAILY   metoprolol succinate 50 MG 24 hr tablet Commonly known as: TOPROL-XL TAKE 1 TABLET BY MOUTH DAILY WITH OR IMMEDIATELY FOLLOWING A MEAL   nitroGLYCERIN 0.4 MG SL tablet Commonly known as: NITROSTAT Place 1 tablet (0.4 mg total) under the tongue every 5 (five) minutes as needed for chest pain.   NovoLIN 70/30 FlexPen Relion (70-30) 100 UNIT/ML KwikPen Generic drug: insulin isophane & regular human 30 units on waking up and 10 units at dinnertime, increase as directed, maximum dose 60 units daily Started by: Elayne Snare, MD   ondansetron 4 MG tablet Commonly known as: Zofran Take 1 tablet (4 mg total) by mouth every 8 (eight) hours as needed for nausea or vomiting.   pantoprazole 40 MG tablet Commonly known as: PROTONIX TAKE 1 TABLET(40 MG) BY MOUTH TWICE DAILY   phentermine 37.5 MG capsule Take 1 capsule (37.5 mg total) by mouth every morning.   polyethylene glycol 17 g packet Commonly known as: MIRALAX / GLYCOLAX Take 17 g by mouth daily as needed.   tamsulosin 0.4 MG Caps capsule Commonly known as: FLOMAX Take 0.4 mg by mouth daily.   traMADol 50 MG tablet Commonly known as: ULTRAM Take 1 tablet (50 mg total) by mouth every 6 (six) hours as needed.       Allergies:  Allergies  Allergen Reactions  . Ciprofloxacin Shortness Of Breath and Other (See Comments)    Dizziness   . Hydrocodone Shortness Of Breath and Other (See Comments)    Tachycardia  . Penicillins Hives    Pt just remembers hives Has patient had a PCN reaction causing immediate rash, facial/tongue/throat swelling, SOB or  lightheadedness with hypotension: No Has patient had a PCN reaction causing severe rash involving mucus membranes or skin necrosis: No Has patient had a PCN reaction that required hospitalization No Has patient had a PCN reaction occurring within the last 10 years: No If all of the above answers are "NO", then may proceed with Cephalosporin use.     Past Medical History:  Diagnosis Date  . Anxiety   . Arthritis   . Blood dyscrasia    "FREE BLEEDER"  . CERVICAL RADICULOPATHY, LEFT   . Chronic back pain   . CKD (chronic kidney disease)    DR. [facility]  . COMMON MIGRAINE   . CORONARY ARTERY DISEASE   . Cough    CURRENT COLD  . Cystitis   .  DEPRESSION   . Diabetes mellitus, type II ([place])   . DIVERTICULOSIS-COLON   . Dysrhythmia    palpitations  . Gastric ulcer 04/2008  . Gastroparesis   . GERD (gastroesophageal reflux disease)   . Hiatal hernia   . Hyperlipidemia   . Hypertension   . Hypothyroidism   . INSOMNIA-SLEEP DISORDER-UNSPEC   . Iron deficiency anemia   . Wears glasses     Past Surgical History:  Procedure Laterality Date  . ABDOMINAL HYSTERECTOMY    . ANTERIOR LAT LUMBAR FUSION Left 08/13/2017   Procedure: LEFT SIDED LUMBAR 3-4 LATERAL INTERBODY FUSION WITH INSTRUMENTATION AND ALLOGRAFT;  Surgeon: Phylliss Bob, MD;  Location: [facility];  Service: Orthopedics;  Laterality: Left;  LEFT SIDED LUMBAR 3-4 LATERAL INTERBODY FUSION WITH INSTRUMENTATION AND ALLOGRAFT; REQUEST 3 HOURS  . Back Stimulator  07/2018  . BACK SURGERY  03/2016  . BREAST EXCISIONAL BIOPSY Right    40 years ago  . CHOLECYSTECTOMY  06/2009  . COLONOSCOPY    . ESOPHAGOGASTRODUODENOSCOPY    . EYE SURGERY Bilateral    lasik  . Gastric Wedge resection lipoma  11/2007   x2 with laparotomy and gastrostomy  . JOINT REPLACEMENT    . Left knee replacement    . Rigth knee replacement with revision  04/2008   Dr. Berenice Primas  . ROTATOR CUFF REPAIR Left 01/2009  . s/p bladder surgury  09/2009    Dr. Terance Hart  . SPINAL CORD STIMULATOR INSERTION N/A 09/11/2018   Procedure: LUMBAR SPINAL CORD STIMULATOR INSERTION;  Surgeon: Clydell Hakim, MD;  Location: [facility];  Service: Neurosurgery;  Laterality: N/A;  LUMBAR SPINAL CORD STIMULATOR INSERTION    Family History  Problem Relation Age of Onset  . Diabetes Sister        x 3  . Heart disease Sister        x2  . Breast cancer Sister 51  . Diabetes Brother        x3  . Heart disease Brother        x2  . Coronary artery disease Other        female 1st degree  . Hypertension Other   . Breast cancer Sister 37  . Breast cancer Other 25  . Diabetes Brother   . Colon cancer Neg Hx     Social History:  reports that she has quit smoking. She has never used smokeless tobacco. She reports that she does not drink alcohol and does not use drugs.  Review of Systems:  Hypertension: Her blood pressure is treated with losartan 100 mg and clonidine 0.1 mg tablets, also on metoprolol  Followed by nephrologist and PCP  BP Readings from Last 3 Encounters:  07/13/20 138/84  07/10/20 (!) 164/86  07/03/20 (!) 158/82     CKD: Her creatinine is variable, followed by nephrologist Recent labs:  Lab Results  Component Value Date   CREATININE 2.17 (H) 07/06/2020   CREATININE 1.85 (H) 05/05/2020   CREATININE 1.48 (H) 04/27/2020   CREATININE 1.78 (H) 01/25/2020    Lipids: Followed by PCP, LDL has been variably controlled  Taking Lipitor 40 mg  Lipids are variable with no consistent control  Lab Results  Component Value Date   CHOL 175 01/26/2020   HDL 56 01/26/2020   LDLCALC 105 (H) 01/26/2020   LDLDIRECT 87.0 01/25/2020   TRIG 71 01/26/2020   CHOLHDL 3.1 01/26/2020     Post ablative hypothyroidism:  Previously she had I-131 treatment on 08/20/11 for Graves' disease  Her dose has been adjusted periodically  She has been taking 125 mg levothyroxine    TSH has fluctuated previously this year and not clear why TSH is now nearly  consistently normal with taking 1 tablet daily except 1-1/2 on weekends   Lab Results  Component Value Date   TSH 4.21 07/06/2020   TSH 0.72 04/27/2020   TSH 0.661 01/26/2020   FREET4 0.63 07/06/2020   FREET4 0.81 09/08/2019   FREET4 0.95 06/21/2019    On Gabapentin 300 mg for neuropathy  Asking about pain in the right upper abdomen, not acute and has not discussed with PCP.  Has history of cholecystectomy  She has been followed by podiatrist for ankle pain   Examination:   BP 138/84   Pulse 89   Wt 286 lb (129.7 kg)   SpO2 98%   BMI 46.16 kg/m   Body mass index is 46.16 kg/m.      ASSESSMENT/ PLAN:   Diabetes type 2,  with obesity:   See history of present illness for detailed discussion of current management, blood sugar patterns and problems identified  A1C is now 9.4  Now recent freestyle libre average blood sugar is 382 but monitoring has been very inadequate Lab glucose 484 She appears to be needing much more basal insulin also compared to before As before her difficulty with affording brand-name insulin is making it difficult for her to control her blood sugar Also she is not motivated to adjust her insulin when blood sugars are high or take additional doses when blood sugars are running higher She says she wants to use an insulin pump but likely this would not be affordable for her  Recommendations for diabetes:  She will start the [employer] brand 70/30 insulin since she cannot afford the Humalog mix  She will start taking 30 units in the morning and 10 units at dinnertime regardless of eating or blood sugar levels  Morning dose will be increased to 40 if afternoon readings do not come down by Sunday  She will double the dose of Lantus and go up to 25 units on Sunday morning sugars are over 200  Detailed instructions reviewed with patient  She asked to check her sugars regularly 4 times a day every day and try to apply the sensor at a more accessible  site on the inner arm  She can continue to take the sample of the U-200 Humalog with 18 units for her meal coverage  To call if blood sugars are not better in 2 weeks  Hypothyroidism: TSH is still normal although relatively higher We will continue same doses, taking 8 tablets a week of the 125 mcg     Patient Instructions  LANTUS 20 UNITS IN AM AND AFTER 3 DAYS if sugars in am are > 200 then go to 25  NOVOLIN 70/30: take 30 units on waking up and if still over 200 at 6 pm in 3 days go to 40 units daily Also take 10 units at 6 pm daily if STILL 200  HUMALOG :TAKE 18 UNITS WHEN EATING LUNCH  Check sugars 4x daily with sensor      Elayne Snare 07/13/2020, 4:44 PM   Note: This office note was prepared with Dragon voice recognition system technology. Any transcriptional errors that result from this process are unintentional.

## 2020-07-13 NOTE — Patient Instructions (Addendum)
LANTUS 20 UNITS IN AM AND AFTER 3 DAYS if sugars in am are > 200 then go to 25  NOVOLIN 70/30: take 30 units on waking up and if still over 200 at 6 pm in 3 days go to 40 units daily Also take 10 units at 6 pm daily if STILL 200  HUMALOG :TAKE 18 UNITS WHEN EATING LUNCH  Check sugars 4x daily with sensor

## 2020-07-26 ENCOUNTER — Ambulatory Visit: Payer: Medicare Other | Admitting: Podiatry

## 2020-07-27 DIAGNOSIS — H43813 Vitreous degeneration, bilateral: Secondary | ICD-10-CM | POA: Diagnosis not present

## 2020-07-27 DIAGNOSIS — H2513 Age-related nuclear cataract, bilateral: Secondary | ICD-10-CM | POA: Diagnosis not present

## 2020-07-31 DIAGNOSIS — R3914 Feeling of incomplete bladder emptying: Secondary | ICD-10-CM | POA: Diagnosis not present

## 2020-07-31 DIAGNOSIS — N3946 Mixed incontinence: Secondary | ICD-10-CM | POA: Diagnosis not present

## 2020-07-31 DIAGNOSIS — N3010 Interstitial cystitis (chronic) without hematuria: Secondary | ICD-10-CM | POA: Diagnosis not present

## 2020-07-31 DIAGNOSIS — N3020 Other chronic cystitis without hematuria: Secondary | ICD-10-CM | POA: Diagnosis not present

## 2020-08-07 DIAGNOSIS — H2512 Age-related nuclear cataract, left eye: Secondary | ICD-10-CM | POA: Diagnosis not present

## 2020-08-07 DIAGNOSIS — H2513 Age-related nuclear cataract, bilateral: Secondary | ICD-10-CM | POA: Diagnosis not present

## 2020-08-08 DIAGNOSIS — H2513 Age-related nuclear cataract, bilateral: Secondary | ICD-10-CM | POA: Diagnosis not present

## 2020-08-08 DIAGNOSIS — H43813 Vitreous degeneration, bilateral: Secondary | ICD-10-CM | POA: Diagnosis not present

## 2020-08-09 ENCOUNTER — Ambulatory Visit: Payer: Medicare Other | Admitting: Podiatry

## 2020-08-09 ENCOUNTER — Other Ambulatory Visit: Payer: Self-pay

## 2020-08-10 ENCOUNTER — Ambulatory Visit: Payer: Medicare Other | Admitting: Podiatry

## 2020-08-14 ENCOUNTER — Other Ambulatory Visit: Payer: Self-pay

## 2020-08-14 ENCOUNTER — Ambulatory Visit (INDEPENDENT_AMBULATORY_CARE_PROVIDER_SITE_OTHER): Payer: Medicare Other | Admitting: Endocrinology

## 2020-08-14 ENCOUNTER — Encounter: Payer: Self-pay | Admitting: Endocrinology

## 2020-08-14 VITALS — BP 150/84 | HR 81 | Ht 66.0 in | Wt 295.8 lb

## 2020-08-14 DIAGNOSIS — E1165 Type 2 diabetes mellitus with hyperglycemia: Secondary | ICD-10-CM | POA: Diagnosis not present

## 2020-08-14 DIAGNOSIS — Z794 Long term (current) use of insulin: Secondary | ICD-10-CM

## 2020-08-14 NOTE — Progress Notes (Signed)
[name] is a 69 y.o. female.    Reason for Appointment: follow-up   History of Present Illness   Diagnosis: Type 2 DIABETES MELITUS, date of diagnosis: 2000  Previous history: She had initially been treated with metformin and Amaryl and subsequently given Victoza which helped overall control and weight loss. In 9/13 because of significant hyperglycemia she was started on basal bolus insulin also. Had required relatively small doses of insulin. Her blood sugar control has been excellent with A1c between 5.3-6.0 although this may be relatively higher than expected. Tends to have relatively higher readings after supper In 6/15 she was told to resume her Victoza and stop her Lantus since she had been gaining weight with Lantus and NovoLog regimen.   Recent history:     INSULIN regimen: Lantus 30 units before breakfast, Humalog 20 units before first meal, Novolin 70/30 30 units at dinner and bedtime   Non-insulin hypoglycemic drugs: None   A1c is 9.4 compared to 8.3  Fructosamine previously 452  Current blood sugar patterns and management:  She has been able to get the 70/30 insulin from [facility], previously had difficulty affording the premixed insulin  Although she was told to take 30 units in the morning of the premixed insulin she is not taking this until her first meal was around 5 PM  She will then also take another dose later at night if her sugar is not coming down  However even though her blood sugars are significantly better compared to her last visit they are still averaging well over 250 on an average  Average blood sugars are running over 300 after about 4 PM until 12 AM  She has gained weight  She has also taken Lantus and now taking 30 units instead of the 25 prescribed  She says she will also take 20 units of Humalog randomly when the blood sugars are high  Again complains of feeling fatigue    Side effects from medications:  none    Glucometer:  Freestyle Libre/Accu-Chek  CGM use % of time  46  2-week average/SD  266 GV 31  Time in range       15%  % Time Above 180  27  % Time above 250  58  % Time Below 70 0     PRE-MEAL Fasting  2-4 PM Dinner Bedtime Overall  Glucose range:       Averages:  197  296  332  318  266   POST-MEAL PC Breakfast PC Lunch PC Dinner  Glucose range:     Averages:    331    Previous data: CGM use % of time  31  2-week average/SD  382  Time in range        %  % Time Above 180   % Time above 250  98  % Time Below 70       Meals: 1-2 meals per day. Lunch 12 Supper at 6-8 PM            Physical activity: exercise: Limited, has had fatigue, back and leg pain.            Dietician visit: Most recent: 12/1495            Complications: are: None  Wt Readings from Last 3 Encounters:  08/14/20 295 lb 12.8 oz (134.2 kg)  07/13/20 286 lb (129.7 kg)  07/03/20 283 lb (128.4 kg)    LABS:  Lab Results  Component Value Date   HGBA1C 9.4 (H) 07/06/2020   HGBA1C 8.3 (A) 04/27/2020   HGBA1C 8.1 (H) 01/26/2020   Lab Results  Component Value Date   MICROALBUR 1.5 07/06/2020   LDLCALC 105 (H) 01/26/2020   CREATININE 2.17 (H) 07/06/2020   Lab Results  Component Value Date   FRUCTOSAMINE 452 (H) 01/25/2020   FRUCTOSAMINE 401 (H) 10/20/2019   FRUCTOSAMINE 369 (H) 09/08/2019     Other active problems: See review of systems   No visits with results within 1 Week(s) from this visit.  Latest known visit with results is:  Appointment on 07/06/2020  Component Date Value Ref Range Status  . Free T4 07/06/2020 0.63  0.60 - 1.60 ng/dL Final   Comment: Specimens from patients who are undergoing biotin therapy and /or ingesting biotin supplements may contain high levels of biotin.  The higher biotin concentration in these specimens interferes with this Free T4 assay.  Specimens that contain high levels  of biotin may cause false high results for this Free T4 assay.  Please interpret  results in light of the total clinical presentation of the patient.    Marland Kitchen TSH 07/06/2020 4.21  0.35 - 4.50 uIU/mL Final  . Sodium 07/06/2020 132* 135 - 145 mEq/L Final  . Potassium 07/06/2020 4.5  3.5 - 5.1 mEq/L Final  . Chloride 07/06/2020 101  96 - 112 mEq/L Final  . CO2 07/06/2020 23  19 - 32 mEq/L Final  . Glucose, Bld 07/06/2020 484* 70 - 99 mg/dL Final  . BUN 07/06/2020 38* 6 - 23 mg/dL Final  . Creatinine, Ser 07/06/2020 2.17* 0.40 - 1.20 mg/dL Final  . GFR 07/06/2020 27.10* >60.00 mL/min Final  . Calcium 07/06/2020 9.0  8.4 - 10.5 mg/dL Final  . Hgb A1c MFr Bld 07/06/2020 9.4* 4.6 - 6.5 % Final   Glycemic Control Guidelines for People with Diabetes:Non Diabetic:  <6%Goal of Therapy: <7%Additional Action Suggested:  >8%   . Microalb, Ur 07/06/2020 1.5  0.0 - 1.9 mg/dL Final  . Creatinine,U 07/06/2020 169.3  mg/dL Final  . Microalb Creat Ratio 07/06/2020 0.9  0.0 - 30.0 mg/g Final    Allergies as of 08/14/2020      Reactions   Ciprofloxacin Shortness Of Breath, Other (See Comments)   Dizziness   Hydrocodone Shortness Of Breath, Other (See Comments)   Tachycardia   Penicillins Hives   Pt just remembers hives Has patient had a PCN reaction causing immediate rash, facial/tongue/throat swelling, SOB or lightheadedness with hypotension: No Has patient had a PCN reaction causing severe rash involving mucus membranes or skin necrosis: No Has patient had a PCN reaction that required hospitalization No Has patient had a PCN reaction occurring within the last 10 years: No If all of the above answers are "NO", then may proceed with Cephalosporin use.      Medication List       Accurate as of August 14, 2020  1:39 PM. If you have any questions, ask your nurse or doctor.        aspirin 81 MG EC tablet Take 81 mg by mouth every evening.   atorvastatin 40 MG tablet Commonly known as: LIPITOR TAKE ONE TABLET BY MOUTH EVERY NIGHT AT BEDTIME   buPROPion 150 MG 24 hr  tablet Commonly known as: WELLBUTRIN XL TAKE 1 TABLET BY MOUTH DAILY   chlorhexidine 0.12 % solution Commonly known as: PERIDEX See admin instructions.   cloNIDine 0.1 MG tablet Commonly known as: CATAPRES Take 1  tablet (0.1 mg total) by mouth 2 (two) times daily.   Elmiron 100 MG capsule Generic drug: pentosan polysulfate Take 200 mg by mouth 2 (two) times daily.   erythromycin 250 MG tablet Commonly known as: E-MYCIN Take 250 mg by mouth 4 (four) times daily.   FLUoxetine 20 MG capsule Commonly known as: PROZAC TAKE ONE CAPSULE BY MOUTH DAILY   FreeStyle Libre 14 Day Reader Kerrin Mo by Does not apply route.   gabapentin 300 MG capsule Commonly known as: NEURONTIN Take 300 mg by mouth 3 (three) times daily.   gabapentin 600 MG tablet Commonly known as: NEURONTIN Take 600 mg by mouth 3 (three) times daily.   glucose blood test strip Commonly known as: FREESTYLE TEST STRIPS Use as instructed to check blood sugar 4 times daily.   HYDROcodone-acetaminophen 5-325 MG tablet Commonly known as: NORCO/VICODIN Take 1 tablet by mouth 4 (four) times daily.   isosorbide mononitrate 60 MG 24 hr tablet Commonly known as: IMDUR Take 1 tablet (60 mg total) by mouth daily.   Lantus SoloStar 100 UNIT/ML Solostar Pen Generic drug: insulin glargine Inject 18 Units into the skin daily.   levothyroxine 125 MCG tablet Commonly known as: SYNTHROID Take 1 tablet by mouth daily, Sunday-Friday. On Saturday, take 1.5 tablets.   linaclotide 72 MCG capsule Commonly known as: Linzess Take 1 capsule (72 mcg total) by mouth daily before breakfast.   losartan 100 MG tablet Commonly known as: COZAAR TAKE 1 TABLET BY MOUTH DAILY   metoprolol succinate 50 MG 24 hr tablet Commonly known as: TOPROL-XL TAKE 1 TABLET BY MOUTH DAILY WITH OR IMMEDIATELY FOLLOWING A MEAL   nitroGLYCERIN 0.4 MG SL tablet Commonly known as: NITROSTAT Place 1 tablet (0.4 mg total) under the tongue every 5 (five)  minutes as needed for chest pain.   NovoLIN 70/30 FlexPen Relion (70-30) 100 UNIT/ML KwikPen Generic drug: insulin isophane & regular human 30 units on waking up and 10 units at dinnertime, increase as directed, maximum dose 60 units daily   ondansetron 4 MG tablet Commonly known as: Zofran Take 1 tablet (4 mg total) by mouth every 8 (eight) hours as needed for nausea or vomiting.   pantoprazole 40 MG tablet Commonly known as: PROTONIX TAKE 1 TABLET(40 MG) BY MOUTH TWICE DAILY   phentermine 37.5 MG capsule Take 1 capsule (37.5 mg total) by mouth every morning.   polyethylene glycol 17 g packet Commonly known as: MIRALAX / GLYCOLAX Take 17 g by mouth daily as needed.   tamsulosin 0.4 MG Caps capsule Commonly known as: FLOMAX Take 0.4 mg by mouth daily.   traMADol 50 MG tablet Commonly known as: ULTRAM Take 1 tablet (50 mg total) by mouth every 6 (six) hours as needed.       Allergies:  Allergies  Allergen Reactions  . Ciprofloxacin Shortness Of Breath and Other (See Comments)    Dizziness   . Hydrocodone Shortness Of Breath and Other (See Comments)    Tachycardia  . Penicillins Hives    Pt just remembers hives Has patient had a PCN reaction causing immediate rash, facial/tongue/throat swelling, SOB or lightheadedness with hypotension: No Has patient had a PCN reaction causing severe rash involving mucus membranes or skin necrosis: No Has patient had a PCN reaction that required hospitalization No Has patient had a PCN reaction occurring within the last 10 years: No If all of the above answers are "NO", then may proceed with Cephalosporin use.     Past Medical History:  Diagnosis Date  . Anxiety   . Arthritis   . Blood dyscrasia    "FREE BLEEDER"  . CERVICAL RADICULOPATHY, LEFT   . Chronic back pain   . CKD (chronic kidney disease)    DR. SANFORD  [facility]  . COMMON MIGRAINE   . CORONARY ARTERY DISEASE   . Cough    CURRENT COLD  . Cystitis   .  DEPRESSION   . Diabetes mellitus, type II ([place])   . DIVERTICULOSIS-COLON   . Dysrhythmia    palpitations  . Gastric ulcer 04/2008  . Gastroparesis   . GERD (gastroesophageal reflux disease)   . Hiatal hernia   . Hyperlipidemia   . Hypertension   . Hypothyroidism   . INSOMNIA-SLEEP DISORDER-UNSPEC   . Iron deficiency anemia   . Wears glasses     Past Surgical History:  Procedure Laterality Date  . ABDOMINAL HYSTERECTOMY    . ANTERIOR LAT LUMBAR FUSION Left 08/13/2017   Procedure: LEFT SIDED LUMBAR 3-4 LATERAL INTERBODY FUSION WITH INSTRUMENTATION AND ALLOGRAFT;  Surgeon: Phylliss Bob, MD;  Location: [facility];  Service: Orthopedics;  Laterality: Left;  LEFT SIDED LUMBAR 3-4 LATERAL INTERBODY FUSION WITH INSTRUMENTATION AND ALLOGRAFT; REQUEST 3 HOURS  . Back Stimulator  07/2018  . BACK SURGERY  03/2016  . BREAST EXCISIONAL BIOPSY Right    40 years ago  . CHOLECYSTECTOMY  06/2009  . COLONOSCOPY    . ESOPHAGOGASTRODUODENOSCOPY    . EYE SURGERY Bilateral    lasik  . Gastric Wedge resection lipoma  11/2007   x2 with laparotomy and gastrostomy  . JOINT REPLACEMENT    . Left knee replacement    . Rigth knee replacement with revision  04/2008   Dr. Berenice Primas  . ROTATOR CUFF REPAIR Left 01/2009  . s/p bladder surgury  09/2009   Dr. Terance Hart  . SPINAL CORD STIMULATOR INSERTION N/A 09/11/2018   Procedure: LUMBAR SPINAL CORD STIMULATOR INSERTION;  Surgeon: Clydell Hakim, MD;  Location: [facility];  Service: Neurosurgery;  Laterality: N/A;  LUMBAR SPINAL CORD STIMULATOR INSERTION    Family History  Problem Relation Age of Onset  . Diabetes Sister        x 3  . Heart disease Sister        x2  . Breast cancer Sister 6  . Diabetes Brother        x3  . Heart disease Brother        x2  . Coronary artery disease Other        female 1st degree  . Hypertension Other   . Breast cancer Sister 3  . Breast cancer Other 25  . Diabetes Brother   . Colon cancer Neg Hx     Social History:  reports  that she has quit smoking. She has never used smokeless tobacco. She reports that she does not drink alcohol and does not use drugs.  Review of Systems:  Hypertension: Her blood pressure is treated with losartan 100 mg and clonidine 0.1 mg tablets, also on metoprolol  Followed by nephrologist and PCP  BP Readings from Last 3 Encounters:  08/14/20 (!) 150/84  07/13/20 138/84  07/10/20 (!) 164/86     CKD: Her creatinine is variable, followed by nephrologist Creatinine is higher as of 3/22  Lab Results  Component Value Date   CREATININE 2.17 (H) 07/06/2020   CREATININE 1.85 (H) 05/05/2020   CREATININE 1.48 (H) 04/27/2020   CREATININE 1.78 (H) 01/25/2020   Lab Results  Component  Value Date   K 4.5 07/06/2020     Lipids: Followed by PCP, LDL has been variably controlled  Taking Lipitor 40 mg  Her lipids are much worse, does also have significant hypothyroidism on this visit  Lab Results  Component Value Date   CHOL 175 01/26/2020   HDL 56 01/26/2020   LDLCALC 105 (H) 01/26/2020   LDLDIRECT 87.0 01/25/2020   TRIG 71 01/26/2020   CHOLHDL 3.1 01/26/2020     Post ablative hypothyroidism:  Previously she had I-131 treatment on 08/20/11 for Graves' disease  Her dose has been adjusted periodically  She has been taking 125 mg levothyroxine prescription consistently and has been taking 1-1/2 on Saturdays and 1 pill on the other days   TSH was low normal previously and sugars told to reduce her regimen by half tablet weekly However for some reason her TSH was much higher in March and she was significantly symptomatic with fatigue Also was having some vomiting at that time  Now she is taking 1-1/2 tablets daily of the 125 mcg dose and has taken this consistently the mornings  Lab Results  Component Value Date   TSH 4.21 07/06/2020   TSH 0.72 04/27/2020   TSH 0.661 01/26/2020   FREET4 0.63 07/06/2020   FREET4 0.81 09/08/2019   FREET4 0.95 06/21/2019    On  Gabapentin 300 mg for neuropathy    Examination:   BP (!) 150/84   Pulse 81   Ht 5\' 6"  (1.676 m)   Wt 295 lb 12.8 oz (134.2 kg)   SpO2 95%   BMI 47.74 kg/m   Body mass index is 47.74 kg/m.   No ankle edema evident   ASSESSMENT/ PLAN:   Diabetes type 2,  with obesity:   See history of present illness for detailed discussion of current management, blood sugar patterns and problems identified  A1C is recently 9%  Even though she had appeared to require a lot of insulin on her last visit and she was recommended a total of 90 units daily her blood sugars are much lower in the last few days This is despite taking only a total of 40 units of insulin a day May be benefiting from using premixed insulin instead of only basal and bolus  Her blood sugars still tend to be highest midday and afternoon but this is partly related to her not taking her premixed insulin before eating Fasting readings are still fairly good with 20 units of Lantus despite not using [place] She may have had some nausea with [place] which is resolving  Recommendations for diabetes:  She was advised to check blood sugars consistently at all different time Emphasized the need to take her premixed insulin well before starting to eat and she needs to tell her husband to remind her to do so; also advised her that late administration of her premixed insulin may cause nocturnal hypoglycemia She will also need to reduce Lantus to 18 units to prevent overnight hypoglycemia Blood sugar targets were discussed She will try to avoid high fat or high carbohydrate meals  Hypothyroidism: Taking 125 mcg, 1-1/2 tablets daily because of very high TSH Not clear if she was having some vomiting problem gastroparesis causing inadequate absorption Labs need to be checked again  Hypertension/renal insufficiency: Losartan was stopped and needs follow-up labs Blood pressure is not as low    There are no Patient Instructions on file  for this visit.     Elayne Snare 08/14/2020, 1:39 PM  Note: This office note was prepared with Estate agent. Any transcriptional errors that result from this process are unintentional.                                           [name] is a 69 y.o. female.    Reason for Appointment: follow-up   History of Present Illness   Diagnosis: Type 2 DIABETES MELITUS, date of diagnosis: 2000  Previous history: She had initially been treated with metformin and Amaryl and subsequently given Victoza which helped overall control and weight loss. In 9/13 because of significant hyperglycemia she was started on basal bolus insulin also. Had required relatively small doses of insulin. Her blood sugar control has been excellent with A1c between 5.3-6.0 although this may be relatively higher than expected. Tends to have relatively higher readings after supper In 6/15 she was told to resume her Victoza and stop her Lantus since she had been gaining weight with Lantus and NovoLog regimen.   Recent history:     INSULIN regimen: Lantus 20 units before Bfst, Novolin mix 10 bid   Non-insulin hypoglycemic drugs: None   Her A1c was last 9% No recent fructosamine available  Current blood sugar patterns and management:  She has had much more variable blood sugars over the last month  She had been asked to increase her basal insulin to 30 units but more recently she started getting low and is now taking 20 units  Previously had a sample of [place] and now starting Lantus in the morning  She was also recommended Humalog mix insulin but because of cost is now taking OTC Novolin 70/30  She does not always take her premixed insulin before meals causing some significantly high readings after her first meal and occasionally in the evening  Last night after taking her premixed insulin 2 hours after eating her blood sugar got low in the  night  However she is checking her blood sugars much more consistently than before with her freestyle libre  Not clear if this is accurate since she does not correlate this with fingersticks    Side effects from medications:  none   Glucometer:  Freestyle Libre/Accu-Chek   CONTINUOUS GLUCOSE MONITORING RECORD INTERPRETATION    Dates of Recording: 4/7 through 4/20  Sensor description: [name]  Results statistics:   CGM use % of time  88  Average and SD  172 GV 45  Time in range    50    %  % Time Above 180  23  % Time above 250  17  % Time Below target  10    PRE-MEAL Fasting Lunch Dinner Bedtime Overall  Glucose range:       Mean/median:  145   167   172   POST-MEAL PC Breakfast PC Lunch PC Dinner  Glucose range:     Mean/median:  217   176    Glycemic patterns summary: Blood sugars show moderate variability at all times with the highest readings early afternoon between 12 noon-4 PM but not consistent As discussed below she has occasional low episodes also  Hyperglycemic episodes have occurred occasionally overnight, periodically midday or early afternoon and a couple of times late evening  Hypoglycemic episodes occurred overnight on 2 nights has also once before 6 PM and another time after 8 PM  Overnight periods: Blood sugars  are quite variable but in the last 4 nights are mostly near normal or slightly low  Preprandial periods: Mealtimes are inconsistent and difficult to identify patterns  Postprandial periods:   Blood sugars are generally going up significantly around midday after her first meal but only occasionally late evening   Previous data:      CGM use % of time  44  2-week average/SD  204, GV 35  Time in range  34     %, was 51  % Time Above 180  33  % Time above 250  29  % Time Below 70 4     PRE-MEAL Fasting Lunch Dinner Bedtime Overall  Glucose range:    224    Averages:  163     204   POST-MEAL PC Breakfast PC Lunch PC Dinner  Glucose  range:     Averages:  213   276   Meals: 1-2 meals per day. Lunch 12 Supper at 5 PM            Physical activity: exercise: Limited, has had fatigue, back and leg pain.            Dietician visit: Most recent: 01/2594            Complications: are: None  Wt Readings from Last 3 Encounters:  08/14/20 295 lb 12.8 oz (134.2 kg)  07/13/20 286 lb (129.7 kg)  07/03/20 283 lb (128.4 kg)    LABS:  Lab Results  Component Value Date   HGBA1C 9.4 (H) 07/06/2020   HGBA1C 8.3 (A) 04/27/2020   HGBA1C 8.1 (H) 01/26/2020   Lab Results  Component Value Date   MICROALBUR 1.5 07/06/2020   LDLCALC 105 (H) 01/26/2020   CREATININE 2.17 (H) 07/06/2020   Lab Results  Component Value Date   FRUCTOSAMINE 452 (H) 01/25/2020   FRUCTOSAMINE 401 (H) 10/20/2019   FRUCTOSAMINE 369 (H) 09/08/2019     Other active problems: See review of systems   No visits with results within 1 Week(s) from this visit.  Latest known visit with results is:  Appointment on 07/06/2020  Component Date Value Ref Range Status  . Free T4 07/06/2020 0.63  0.60 - 1.60 ng/dL Final   Comment: Specimens from patients who are undergoing biotin therapy and /or ingesting biotin supplements may contain high levels of biotin.  The higher biotin concentration in these specimens interferes with this Free T4 assay.  Specimens that contain high levels  of biotin may cause false high results for this Free T4 assay.  Please interpret results in light of the total clinical presentation of the patient.    Marland Kitchen TSH 07/06/2020 4.21  0.35 - 4.50 uIU/mL Final  . Sodium 07/06/2020 132* 135 - 145 mEq/L Final  . Potassium 07/06/2020 4.5  3.5 - 5.1 mEq/L Final  . Chloride 07/06/2020 101  96 - 112 mEq/L Final  . CO2 07/06/2020 23  19 - 32 mEq/L Final  . Glucose, Bld 07/06/2020 484* 70 - 99 mg/dL Final  . BUN 07/06/2020 38* 6 - 23 mg/dL Final  . Creatinine, Ser 07/06/2020 2.17* 0.40 - 1.20 mg/dL Final  . GFR 07/06/2020 27.10* >60.00 mL/min Final   . Calcium 07/06/2020 9.0  8.4 - 10.5 mg/dL Final  . Hgb A1c MFr Bld 07/06/2020 9.4* 4.6 - 6.5 % Final   Glycemic Control Guidelines for People with Diabetes:Non Diabetic:  <6%Goal of Therapy: <7%Additional Action Suggested:  >8%   . Microalb, Ur 07/06/2020 1.5  0.0 -  1.9 mg/dL Final  . Creatinine,U 07/06/2020 169.3  mg/dL Final  . Microalb Creat Ratio 07/06/2020 0.9  0.0 - 30.0 mg/g Final    Allergies as of 08/14/2020      Reactions   Ciprofloxacin Shortness Of Breath, Other (See Comments)   Dizziness   Hydrocodone Shortness Of Breath, Other (See Comments)   Tachycardia   Penicillins Hives   Pt just remembers hives Has patient had a PCN reaction causing immediate rash, facial/tongue/throat swelling, SOB or lightheadedness with hypotension: No Has patient had a PCN reaction causing severe rash involving mucus membranes or skin necrosis: No Has patient had a PCN reaction that required hospitalization No Has patient had a PCN reaction occurring within the last 10 years: No If all of the above answers are "NO", then may proceed with Cephalosporin use.      Medication List       Accurate as of August 14, 2020  1:39 PM. If you have any questions, ask your nurse or doctor.        aspirin 81 MG EC tablet Take 81 mg by mouth every evening.   atorvastatin 40 MG tablet Commonly known as: LIPITOR TAKE ONE TABLET BY MOUTH EVERY NIGHT AT BEDTIME   buPROPion 150 MG 24 hr tablet Commonly known as: WELLBUTRIN XL TAKE 1 TABLET BY MOUTH DAILY   chlorhexidine 0.12 % solution Commonly known as: PERIDEX See admin instructions.   cloNIDine 0.1 MG tablet Commonly known as: CATAPRES Take 1 tablet (0.1 mg total) by mouth 2 (two) times daily.   Elmiron 100 MG capsule Generic drug: pentosan polysulfate Take 200 mg by mouth 2 (two) times daily.   erythromycin 250 MG tablet Commonly known as: E-MYCIN Take 250 mg by mouth 4 (four) times daily.   FLUoxetine 20 MG capsule Commonly known  as: PROZAC TAKE ONE CAPSULE BY MOUTH DAILY   FreeStyle Libre 14 Day Reader Kerrin Mo by Does not apply route.   gabapentin 300 MG capsule Commonly known as: NEURONTIN Take 300 mg by mouth 3 (three) times daily.   gabapentin 600 MG tablet Commonly known as: NEURONTIN Take 600 mg by mouth 3 (three) times daily.   glucose blood test strip Commonly known as: FREESTYLE TEST STRIPS Use as instructed to check blood sugar 4 times daily.   HYDROcodone-acetaminophen 5-325 MG tablet Commonly known as: NORCO/VICODIN Take 1 tablet by mouth 4 (four) times daily.   isosorbide mononitrate 60 MG 24 hr tablet Commonly known as: IMDUR Take 1 tablet (60 mg total) by mouth daily.   Lantus SoloStar 100 UNIT/ML Solostar Pen Generic drug: insulin glargine Inject 18 Units into the skin daily.   levothyroxine 125 MCG tablet Commonly known as: SYNTHROID Take 1 tablet by mouth daily, Sunday-Friday. On Saturday, take 1.5 tablets.   linaclotide 72 MCG capsule Commonly known as: Linzess Take 1 capsule (72 mcg total) by mouth daily before breakfast.   losartan 100 MG tablet Commonly known as: COZAAR TAKE 1 TABLET BY MOUTH DAILY   metoprolol succinate 50 MG 24 hr tablet Commonly known as: TOPROL-XL TAKE 1 TABLET BY MOUTH DAILY WITH OR IMMEDIATELY FOLLOWING A MEAL   nitroGLYCERIN 0.4 MG SL tablet Commonly known as: NITROSTAT Place 1 tablet (0.4 mg total) under the tongue every 5 (five) minutes as needed for chest pain.   NovoLIN 70/30 FlexPen Relion (70-30) 100 UNIT/ML KwikPen Generic drug: insulin isophane & regular human 30 units on waking up and 10 units at dinnertime, increase as directed, maximum dose 60 units  daily   ondansetron 4 MG tablet Commonly known as: Zofran Take 1 tablet (4 mg total) by mouth every 8 (eight) hours as needed for nausea or vomiting.   pantoprazole 40 MG tablet Commonly known as: PROTONIX TAKE 1 TABLET(40 MG) BY MOUTH TWICE DAILY   phentermine 37.5 MG capsule Take  1 capsule (37.5 mg total) by mouth every morning.   polyethylene glycol 17 g packet Commonly known as: MIRALAX / GLYCOLAX Take 17 g by mouth daily as needed.   tamsulosin 0.4 MG Caps capsule Commonly known as: FLOMAX Take 0.4 mg by mouth daily.   traMADol 50 MG tablet Commonly known as: ULTRAM Take 1 tablet (50 mg total) by mouth every 6 (six) hours as needed.       Allergies:  Allergies  Allergen Reactions  . Ciprofloxacin Shortness Of Breath and Other (See Comments)    Dizziness   . Hydrocodone Shortness Of Breath and Other (See Comments)    Tachycardia  . Penicillins Hives    Pt just remembers hives Has patient had a PCN reaction causing immediate rash, facial/tongue/throat swelling, SOB or lightheadedness with hypotension: No Has patient had a PCN reaction causing severe rash involving mucus membranes or skin necrosis: No Has patient had a PCN reaction that required hospitalization No Has patient had a PCN reaction occurring within the last 10 years: No If all of the above answers are "NO", then may proceed with Cephalosporin use.     Past Medical History:  Diagnosis Date  . Anxiety   . Arthritis   . Blood dyscrasia    "FREE BLEEDER"  . CERVICAL RADICULOPATHY, LEFT   . Chronic back pain   . CKD (chronic kidney disease)    DR. SANFORD  [facility]  . COMMON MIGRAINE   . CORONARY ARTERY DISEASE   . Cough    CURRENT COLD  . Cystitis   . DEPRESSION   . Diabetes mellitus, type II ([place])   . DIVERTICULOSIS-COLON   . Dysrhythmia    palpitations  . Gastric ulcer 04/2008  . Gastroparesis   . GERD (gastroesophageal reflux disease)   . Hiatal hernia   . Hyperlipidemia   . Hypertension   . Hypothyroidism   . INSOMNIA-SLEEP DISORDER-UNSPEC   . Iron deficiency anemia   . Wears glasses     Past Surgical History:  Procedure Laterality Date  . ABDOMINAL HYSTERECTOMY    . ANTERIOR LAT LUMBAR FUSION Left 08/13/2017   Procedure: LEFT SIDED LUMBAR 3-4 LATERAL  INTERBODY FUSION WITH INSTRUMENTATION AND ALLOGRAFT;  Surgeon: Phylliss Bob, MD;  Location: [facility];  Service: Orthopedics;  Laterality: Left;  LEFT SIDED LUMBAR 3-4 LATERAL INTERBODY FUSION WITH INSTRUMENTATION AND ALLOGRAFT; REQUEST 3 HOURS  . Back Stimulator  07/2018  . BACK SURGERY  03/2016  . BREAST EXCISIONAL BIOPSY Right    40 years ago  . CHOLECYSTECTOMY  06/2009  . COLONOSCOPY    . ESOPHAGOGASTRODUODENOSCOPY    . EYE SURGERY Bilateral    lasik  . Gastric Wedge resection lipoma  11/2007   x2 with laparotomy and gastrostomy  . JOINT REPLACEMENT    . Left knee replacement    . Rigth knee replacement with revision  04/2008   Dr. Berenice Primas  . ROTATOR CUFF REPAIR Left 01/2009  . s/p bladder surgury  09/2009   Dr. Terance Hart  . SPINAL CORD STIMULATOR INSERTION N/A 09/11/2018   Procedure: LUMBAR SPINAL CORD STIMULATOR INSERTION;  Surgeon: Clydell Hakim, MD;  Location: [facility];  Service: Neurosurgery;  Laterality: N/A;  LUMBAR SPINAL CORD STIMULATOR INSERTION    Family History  Problem Relation Age of Onset  . Diabetes Sister        x 3  . Heart disease Sister        x2  . Breast cancer Sister 72  . Diabetes Brother        x3  . Heart disease Brother        x2  . Coronary artery disease Other        female 1st degree  . Hypertension Other   . Breast cancer Sister 5  . Breast cancer Other 25  . Diabetes Brother   . Colon cancer Neg Hx     Social History:  reports that she has quit smoking. She has never used smokeless tobacco. She reports that she does not drink alcohol and does not use drugs.  Review of Systems:  Hypertension: Her blood pressure is treated with losartan 100 mg and clonidine 0.1 mg tablets, also on metoprolol  Followed by nephrologist and PCP  BP Readings from Last 3 Encounters:  08/14/20 (!) 150/84  07/13/20 138/84  07/10/20 (!) 164/86     CKD: Her creatinine is variable, followed by nephrologist Recent labs:  Lab Results  Component Value Date    CREATININE 2.17 (H) 07/06/2020   CREATININE 1.85 (H) 05/05/2020   CREATININE 1.48 (H) 04/27/2020   CREATININE 1.78 (H) 01/25/2020    Lipids: Followed by PCP, LDL has been variably controlled  Taking Lipitor 40 mg  Lipids are variable with no consistent control  Lab Results  Component Value Date   CHOL 175 01/26/2020   HDL 56 01/26/2020   LDLCALC 105 (H) 01/26/2020   LDLDIRECT 87.0 01/25/2020   TRIG 71 01/26/2020   CHOLHDL 3.1 01/26/2020     Post ablative hypothyroidism:  Previously she had I-131 treatment on 08/20/11 for Graves' disease  Her dose has been adjusted periodically  She has been taking 125 mg levothyroxine    TSH has fluctuated previously this year and not clear why TSH is now nearly consistently normal with taking 1 tablet daily except 1-1/2 on weekends   Lab Results  Component Value Date   TSH 4.21 07/06/2020   TSH 0.72 04/27/2020   TSH 0.661 01/26/2020   FREET4 0.63 07/06/2020   FREET4 0.81 09/08/2019   FREET4 0.95 06/21/2019    On Gabapentin 300 mg for neuropathy  Asking about pain in the right upper abdomen, not acute and has not discussed with PCP.  Has history of cholecystectomy  She has been followed by podiatrist for ankle pain   Examination:   BP (!) 150/84   Pulse 81   Ht 5\' 6"  (1.676 m)   Wt 295 lb 12.8 oz (134.2 kg)   SpO2 95%   BMI 47.74 kg/m   Body mass index is 47.74 kg/m.      ASSESSMENT/ PLAN:   Diabetes type 2,  with obesity:   See history of present illness for detailed discussion of current management, blood sugar patterns and problems identified  A1C is last 9.4  Blood sugars are somewhat better with taking 70/30 insulin as above but on her freestyle libre still averaging 266 and only 15% of blood sugars are within target range She is significantly insulin resistant even with taking about 100 units of insulin a day she has only occasional blood sugars improving However most of her improved blood sugars are  related to taking premixed insulin at bedtime  She appears to need significant amount of insulin during the daytime compared to nighttime but she did not take her morning dose of 70/30 until 5:00 in the afternoon despite repeated instructions in writing  She says she wants to use an insulin pump but likely this would not be affordable for her  Recommendations for diabetes:  She will need to take the Novolin brand 70/30 insulin on waking up and before her meal in the late afternoon and not at bedtime to avoid excessive drop in sugars overnight  She will need to take the morning dose regardless of what her blood sugar is and if low normal can take a snack with it  She will take 40 units of this insulin in the morning and before her sandwich or other meal around 5 PM  She can take Humalog if the blood sugar is over 300 at any time especially before her meals  She will reduce her Lantus to 25 units to avoid low sugars overnight  She will apply for patient assistance for Victoza  She will need to make sure she follows these instructions and not take arbitrary doses of insulin  To check blood sugars 3-4 times a day consistently  She will need to follow-up with the diabetes educator for follow-up and assess her progress as well as assess her capability of using an insulin pump such as OmniPod  Follow-up in 6 weeks  Hypothyroidism: TSH will need to be checked on the next visit  Total visit time =30 minutes including review of downloaded information on her freestyle libre, counseling, explanation of action of insulin, doses, timing, diet, insulin pumps and blood sugar targets  There are no Patient Instructions on file for this visit.     Elayne Snare 08/14/2020, 1:39 PM   Note: This office note was prepared with Dragon voice recognition system technology. Any transcriptional errors that result from this process are  unintentional.

## 2020-08-14 NOTE — Patient Instructions (Signed)
Important: Do not take the Novolin 70/30 insulin at bedtime  Take the Novolin 70/30 insulin, 40 units on waking up regardless of blood sugar, may have a small snack  Take another 40 units before eating your meal or sandwich in the late afternoon.  Cut down the Lantus to 25 units daily  May take 20 units of HUMALOG anytime the blood sugar is over 300 in the afternoon or evening but no other time

## 2020-08-16 ENCOUNTER — Other Ambulatory Visit: Payer: Self-pay

## 2020-08-16 ENCOUNTER — Encounter: Payer: Self-pay | Admitting: Podiatry

## 2020-08-16 ENCOUNTER — Ambulatory Visit (INDEPENDENT_AMBULATORY_CARE_PROVIDER_SITE_OTHER): Payer: Medicare Other | Admitting: Podiatry

## 2020-08-16 DIAGNOSIS — M76821 Posterior tibial tendinitis, right leg: Secondary | ICD-10-CM | POA: Diagnosis not present

## 2020-08-16 DIAGNOSIS — T148XXA Other injury of unspecified body region, initial encounter: Secondary | ICD-10-CM | POA: Diagnosis not present

## 2020-08-16 NOTE — Progress Notes (Signed)
Subjective:   Patient ID: [name], female   DOB: 69 y.o.   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  I reviewed at great length this condition the difficulty of it she did have a low sugar episode in the chair and we did give her glucose tabs and crackers and apple juice and were able to bring her sugar up before she was discharged.  Do not recommend surgery for this patient due to obesity and what I would consider a difficult medical environment.  Try immobilization currently to see if we get symptoms under control

## 2020-08-17 ENCOUNTER — Telehealth: Payer: Self-pay | Admitting: Endocrinology

## 2020-08-17 NOTE — Telephone Encounter (Signed)
Message left for patient to return my call.  

## 2020-08-17 NOTE — Telephone Encounter (Signed)
Spoken to patient. She stated that her blood sugar have been reading yesterday at 42 and this morning at 42. She stated that she was able to being it up at her foot doctor appointment which was 52.   Patient stated that Dr Dwyane Dee does not have time for her and this is not working because her sugar is getting too low because she believe she is taking too much insulin.   Please advise.

## 2020-08-17 NOTE — Telephone Encounter (Signed)
At 11 am yesterday 11/10 patient  took 40 units of insulin but did not eat anything. She went to the Drs. And they were able to get it up to 22 after she left the Drs. She had a sandwich. 5 pm she bottomed out again. 6:30 she had dinner but took no insulin and at 3 am she woke up to her sugar being 42.

## 2020-08-17 NOTE — Telephone Encounter (Signed)
Patient requests to be called at ph# [phone]  re: Patient states she had a reaction to insulin isophane & regular human (NOVOLIN 70/30 FLEXPEN RELION) (70-30) 100 UNIT/ML KwikPen and bottomed out twice yesterday and at 3 am this morning

## 2020-08-17 NOTE — Telephone Encounter (Signed)
Noted, patient is aware and voiced understanding.

## 2020-08-17 NOTE — Telephone Encounter (Signed)
She will reduce her 70/30 insulin to 25 units on waking up and 25 units before her evening meal Also need to reduce her Lantus from 25 units to 15 units daily

## 2020-08-17 NOTE — Telephone Encounter (Signed)
Please find out exactly what times her sugars have been below 60 and how much insulin doses she is taking.  What symptoms did she have with a low sugar?

## 2020-08-17 NOTE — Telephone Encounter (Signed)
Patient called back to follow up on her call from this morning, wanted Dr Dwyane Dee to call her, I spoke to [facility] and she stated she would give patient a call back in a few min. FYI

## 2020-08-21 DIAGNOSIS — E785 Hyperlipidemia, unspecified: Secondary | ICD-10-CM | POA: Diagnosis not present

## 2020-08-21 DIAGNOSIS — I25118 Atherosclerotic heart disease of native coronary artery with other forms of angina pectoris: Secondary | ICD-10-CM | POA: Diagnosis not present

## 2020-08-21 DIAGNOSIS — I10 Essential (primary) hypertension: Secondary | ICD-10-CM | POA: Diagnosis not present

## 2020-08-21 DIAGNOSIS — N189 Chronic kidney disease, unspecified: Secondary | ICD-10-CM | POA: Diagnosis not present

## 2020-08-21 DIAGNOSIS — E119 Type 2 diabetes mellitus without complications: Secondary | ICD-10-CM | POA: Diagnosis not present

## 2020-08-24 DIAGNOSIS — N3020 Other chronic cystitis without hematuria: Secondary | ICD-10-CM | POA: Diagnosis not present

## 2020-08-24 DIAGNOSIS — R3914 Feeling of incomplete bladder emptying: Secondary | ICD-10-CM | POA: Diagnosis not present

## 2020-08-24 DIAGNOSIS — N3010 Interstitial cystitis (chronic) without hematuria: Secondary | ICD-10-CM | POA: Diagnosis not present

## 2020-08-28 DIAGNOSIS — N189 Chronic kidney disease, unspecified: Secondary | ICD-10-CM | POA: Diagnosis not present

## 2020-08-28 DIAGNOSIS — N183 Chronic kidney disease, stage 3 unspecified: Secondary | ICD-10-CM | POA: Diagnosis not present

## 2020-08-29 ENCOUNTER — Ambulatory Visit: Payer: Medicare Other | Admitting: Nutrition

## 2020-08-29 DIAGNOSIS — N3020 Other chronic cystitis without hematuria: Secondary | ICD-10-CM | POA: Diagnosis not present

## 2020-08-29 DIAGNOSIS — N3010 Interstitial cystitis (chronic) without hematuria: Secondary | ICD-10-CM | POA: Diagnosis not present

## 2020-08-29 DIAGNOSIS — R3914 Feeling of incomplete bladder emptying: Secondary | ICD-10-CM | POA: Diagnosis not present

## 2020-08-30 DIAGNOSIS — M961 Postlaminectomy syndrome, not elsewhere classified: Secondary | ICD-10-CM | POA: Diagnosis not present

## 2020-08-30 DIAGNOSIS — Z9689 Presence of other specified functional implants: Secondary | ICD-10-CM | POA: Diagnosis not present

## 2020-09-05 DIAGNOSIS — D631 Anemia in chronic kidney disease: Secondary | ICD-10-CM | POA: Diagnosis not present

## 2020-09-05 DIAGNOSIS — N183 Chronic kidney disease, stage 3 unspecified: Secondary | ICD-10-CM | POA: Diagnosis not present

## 2020-09-05 DIAGNOSIS — I129 Hypertensive chronic kidney disease with stage 1 through stage 4 chronic kidney disease, or unspecified chronic kidney disease: Secondary | ICD-10-CM | POA: Diagnosis not present

## 2020-09-13 ENCOUNTER — Telehealth: Payer: Self-pay | Admitting: *Deleted

## 2020-09-13 ENCOUNTER — Other Ambulatory Visit: Payer: Self-pay | Admitting: Endocrinology

## 2020-09-13 NOTE — Telephone Encounter (Signed)
Please find out exactly how much insulin she is taking and how much her blood sugars are between waking up time and bedtime for the last 3 days in a row

## 2020-09-13 NOTE — Telephone Encounter (Signed)
Called and spoke with patient. She states her sugars has been running low between 60 and 50 and will only increase if she eats some chocolate or something sweet. This morning before breakfast patient's sugar was 64 and last night around 11p it was 56. Patient is also having a hard time controlling her bladder and bowel movements which she states started yesterday and got really bad last night.

## 2020-09-13 NOTE — Telephone Encounter (Signed)
Called and left a message for the patient to call back with blood glucose readings as well as how much insulin she's been taking. Awaiting call back.

## 2020-09-13 NOTE — Telephone Encounter (Signed)
Patient called back and advised she was taking the incorrect dosages for her insulin. She was taking 20 units of the Lantus and 35 of the 70/30. Advised patient the last instructions given was to decrease her 70/30 insulin to 25 upon waking up and 25 before her evening meal. Lantus was to be reduced from 25 to 15 units daily. Patient wrote down instructions and verbalized understanding. Advised patient to call back if she still does not feel well and for further assistance.

## 2020-09-13 NOTE — Telephone Encounter (Signed)
Patient called wanting to discuss of concerns. Patient states she has lost control of her bladder, and her sugar keeps going up. Please call patient [phone]

## 2020-09-25 ENCOUNTER — Ambulatory Visit: Payer: Medicare Other | Admitting: Internal Medicine

## 2020-09-26 DIAGNOSIS — H2511 Age-related nuclear cataract, right eye: Secondary | ICD-10-CM | POA: Diagnosis not present

## 2020-09-26 DIAGNOSIS — H43813 Vitreous degeneration, bilateral: Secondary | ICD-10-CM | POA: Diagnosis not present

## 2020-09-27 ENCOUNTER — Other Ambulatory Visit: Payer: Self-pay | Admitting: Endocrinology

## 2020-09-27 NOTE — Telephone Encounter (Signed)
Ok to refill? Last prescribed on  07/04/2020. Last OV on 08/17/2020

## 2020-09-28 ENCOUNTER — Ambulatory Visit: Payer: Medicare Other | Admitting: Endocrinology

## 2020-09-28 NOTE — Telephone Encounter (Signed)
Please refill as per office routine med refill policy (all routine meds refilled for 3 mo or monthly per pt preference up to one year from last visit, then month to month grace period for 3 mo, then further med refills will have to be denied)  

## 2020-09-28 NOTE — Telephone Encounter (Signed)
Please review and fill prescription, I am not managing her hypertension

## 2020-10-02 ENCOUNTER — Other Ambulatory Visit: Payer: Self-pay | Admitting: *Deleted

## 2020-10-03 ENCOUNTER — Encounter: Payer: Self-pay | Admitting: Endocrinology

## 2020-10-03 ENCOUNTER — Other Ambulatory Visit: Payer: Self-pay

## 2020-10-03 ENCOUNTER — Ambulatory Visit (INDEPENDENT_AMBULATORY_CARE_PROVIDER_SITE_OTHER): Payer: Medicare Other | Admitting: Endocrinology

## 2020-10-03 ENCOUNTER — Ambulatory Visit: Payer: Medicare Other | Admitting: Nutrition

## 2020-10-03 VITALS — BP 130/78 | HR 78 | Ht 66.0 in | Wt 296.0 lb

## 2020-10-03 DIAGNOSIS — E890 Postprocedural hypothyroidism: Secondary | ICD-10-CM | POA: Diagnosis not present

## 2020-10-03 DIAGNOSIS — E1165 Type 2 diabetes mellitus with hyperglycemia: Secondary | ICD-10-CM | POA: Diagnosis not present

## 2020-10-03 DIAGNOSIS — Z794 Long term (current) use of insulin: Secondary | ICD-10-CM

## 2020-10-03 LAB — COMPREHENSIVE METABOLIC PANEL
ALT: 19 U/L (ref 0–35)
AST: 27 U/L (ref 0–37)
Albumin: 4.1 g/dL (ref 3.5–5.2)
Alkaline Phosphatase: 78 U/L (ref 39–117)
BUN: 20 mg/dL (ref 6–23)
CO2: 27 mEq/L (ref 19–32)
Calcium: 9.7 mg/dL (ref 8.4–10.5)
Chloride: 102 mEq/L (ref 96–112)
Creatinine, Ser: 1.62 mg/dL — ABNORMAL HIGH (ref 0.40–1.20)
GFR: 31.97 mL/min — ABNORMAL LOW (ref >60.00)
Glucose, Bld: 116 mg/dL — ABNORMAL HIGH (ref 70–99)
Potassium: 3.9 mEq/L (ref 3.5–5.1)
Sodium: 136 mEq/L (ref 135–145)
Total Bilirubin: 0.8 mg/dL (ref 0.2–1.2)
Total Protein: 7.7 g/dL (ref 6.0–8.3)

## 2020-10-03 LAB — T4, FREE: Free T4: <0.25 ng/dL — ABNORMAL LOW (ref 0.60–1.60)

## 2020-10-03 LAB — POCT GLYCOSYLATED HEMOGLOBIN (HGB A1C): Hemoglobin A1C: 8.1 % — AB (ref 4.0–5.6)

## 2020-10-03 NOTE — Patient Instructions (Addendum)
LANTUS 16 UNITS AND am sugar is < 90 then reduce to 14  70/30 take at least 8 Units before 1st meal and 12 before pm meal daily Go up on dose as needed  No 70/30 after meals  Check blood sugars on waking up 7 days a week  Also check blood sugars about 2 hours after meals and do this after different meals by rotation  Recommended blood sugar levels on waking up are 90-130 and about 2 hours after meal is 130-160  Please bring your blood sugar monitor to each visit, thank you

## 2020-10-03 NOTE — Progress Notes (Signed)
[name] is a 69 y.o. female.    Reason for Appointment: follow-up   History of Present Illness   Diagnosis: Type 2 DIABETES MELITUS, date of diagnosis: 2000  Previous history: She had initially been treated with metformin and Amaryl and subsequently given Victoza which helped overall control and weight loss. In 9/13 because of significant hyperglycemia she was started on basal bolus insulin also. Had required relatively small doses of insulin. Her blood sugar control has been excellent with A1c between 5.3-6.0 although this may be relatively higher than expected. Tends to have relatively higher readings after supper In 6/15 she was told to resume her Victoza and stop her Lantus since she had been gaining weight with Lantus and NovoLog regimen.   Recent history:     INSULIN regimen: Lantus 20 units before breakfast, Novolin 70/30, 20-30 units as needed   Non-insulin hypoglycemic drugs: None   A1c is improved at 8.1  Fructosamine previously 452  Current blood sugar patterns and management:  She started having relatively low sugars in 11/21 but was taking larger than prescribed doses of insulin  Because of fear of hypoglycemia she has stopped taking the 70/30 insulin unless blood sugars are over 300  Most likely she takes this insulin sometime in the late afternoon or evening when blood sugars are higher  As before blood sugars progressively rise after about 10 AM regardless of what she is eating  She has been repeatedly told to take her premixed insulin proactively in the morning on waking up but she does not do so  Her portions are small and she may not always have much carbohydrate but she still has significant hyperglycemia during the day  She has had excessive amounts of low sugars overnight with her current dose of Lantus although not always symptomatic  [name] was stopped previously because of low basal insulin requirement and nausea    Side effects  from medications:  none   Glucometer:  Freestyle Libre  CGM data  interpretation as follows   Not clear if her readings are accurate compared to fingersticks but previously they had been about 10-15 mg lower than actual blood sugars at least  Available data indicates blood sugars to be progressively higher between about 10 AM until 10 PM with some variability.  Subsequently blood sugars decrease significantly and are relatively low around 1-2 AM  The patterns of blood sugars are inconsistent from day-to-day and glucose variability is high at 43%  Her best blood sugar day was yesterday averaging 126  OVERNIGHT blood sugars not fully evaluated but appear to be variably low at 1 AM and subsequently averaging about 140 but not consistently  Blood sugars are usually variably high after her 2 meals including in the morning after about 10 AM and as high as 398 after dinner  Hypoglycemia none documented on the last 2 nights around 4 AM or 8 AM as well as twice in the first week of the data  CGM use % of time   2-week average/GV  196/43  Time in range       40%  % Time Above 180  23  % Time above 250  30  % Time Below 70 7     PRE-MEAL Fasting Lunch Dinner Bedtime Overall  Glucose range:       Averages:  141   256  238  196   POST-MEAL PC Breakfast PC Lunch PC Dinner  Glucose range:  Averages:  218   267   Previous data:  CGM use % of time  46  2-week average/SD  266 GV 31  Time in range       15%  % Time Above 180  27  % Time above 250  58  % Time Below 70 0     PRE-MEAL Fasting  2-4 PM Dinner Bedtime Overall  Glucose range:       Averages:  197  296  332  318  266   POST-MEAL PC Breakfast PC Lunch PC Dinner  Glucose range:     Averages:    331    Meals: 2 meals per day. Lunch 12 Supper at 6-7 PM            Physical activity: exercise: Limited, has had fatigue, back and leg pain.            Dietician visit: Most recent: 11/7780            Complications: are:  None  Wt Readings from Last 3 Encounters:  10/03/20 296 lb (134.3 kg)  08/14/20 295 lb 12.8 oz (134.2 kg)  07/13/20 286 lb (129.7 kg)    LABS:  Lab Results  Component Value Date   HGBA1C 8.1 (A) 10/03/2020   HGBA1C 9.4 (H) 07/06/2020   HGBA1C 8.3 (A) 04/27/2020   Lab Results  Component Value Date   MICROALBUR 1.5 07/06/2020   LDLCALC 105 (H) 01/26/2020   CREATININE 2.17 (H) 07/06/2020   Lab Results  Component Value Date   FRUCTOSAMINE 452 (H) 01/25/2020   FRUCTOSAMINE 401 (H) 10/20/2019   FRUCTOSAMINE 369 (H) 09/08/2019     Other active problems: See review of systems   Office Visit on 10/03/2020  Component Date Value Ref Range Status  . Hemoglobin A1C 10/03/2020 8.1* 4.0 - 5.6 % Final    Allergies as of 10/03/2020      Reactions   Ciprofloxacin Shortness Of Breath, Other (See Comments)   Dizziness   Hydrocodone Shortness Of Breath, Other (See Comments)   Tachycardia   Penicillins Hives   Pt just remembers hives Has patient had a PCN reaction causing immediate rash, facial/tongue/throat swelling, SOB or lightheadedness with hypotension: No Has patient had a PCN reaction causing severe rash involving mucus membranes or skin necrosis: No Has patient had a PCN reaction that required hospitalization No Has patient had a PCN reaction occurring within the last 10 years: No If all of the above answers are "NO", then may proceed with Cephalosporin use.      Medication List       Accurate as of October 03, 2020  2:15 PM. If you have any questions, ask your nurse or doctor.        aspirin 81 MG EC tablet Take 81 mg by mouth every evening.   atorvastatin 40 MG tablet Commonly known as: LIPITOR TAKE ONE TABLET BY MOUTH EVERY NIGHT AT BEDTIME   buPROPion 150 MG 24 hr tablet Commonly known as: WELLBUTRIN XL TAKE 1 TABLET BY MOUTH DAILY   cephALEXin 500 MG capsule Commonly known as: KEFLEX Take 500 mg by mouth 2 (two) times daily.   chlorhexidine 0.12 %  solution Commonly known as: PERIDEX See admin instructions.   cloNIDine 0.1 MG tablet Commonly known as: CATAPRES TAKE ONE TABLET BY MOUTH TWICE A DAY   Elmiron 100 MG capsule Generic drug: pentosan polysulfate Take 200 mg by mouth 2 (two) times daily.   erythromycin 250 MG tablet  Commonly known as: E-MYCIN Take 250 mg by mouth 4 (four) times daily.   FLUoxetine 20 MG capsule Commonly known as: PROZAC TAKE ONE CAPSULE BY MOUTH DAILY   FreeStyle Libre 14 Day Reader Kerrin Mo by Does not apply route.   gabapentin 300 MG capsule Commonly known as: NEURONTIN Take 300 mg by mouth 3 (three) times daily.   gabapentin 600 MG tablet Commonly known as: NEURONTIN Take 600 mg by mouth 3 (three) times daily.   glucose blood test strip Commonly known as: FREESTYLE TEST STRIPS Use as instructed to check blood sugar 4 times daily.   HYDROcodone-acetaminophen 5-325 MG tablet Commonly known as: NORCO/VICODIN Take 1 tablet by mouth 4 (four) times daily.   isosorbide mononitrate 60 MG 24 hr tablet Commonly known as: IMDUR Take 1 tablet (60 mg total) by mouth daily.   Lantus SoloStar 100 UNIT/ML Solostar Pen Generic drug: insulin glargine Inject 18 Units into the skin daily. What changed: how much to take   levothyroxine 125 MCG tablet Commonly known as: SYNTHROID Take 1 tablet by mouth daily, Sunday-Friday. On Saturday, take 1.5 tablets.   linaclotide 72 MCG capsule Commonly known as: Linzess Take 1 capsule (72 mcg total) by mouth daily before breakfast.   losartan 100 MG tablet Commonly known as: COZAAR TAKE 1 TABLET BY MOUTH DAILY   metoprolol succinate 50 MG 24 hr tablet Commonly known as: TOPROL-XL TAKE 1 TABLET BY MOUTH DAILY WITH OR IMMEDIATELY FOLLOWING A MEAL   nitroGLYCERIN 0.4 MG SL tablet Commonly known as: NITROSTAT Place 1 tablet (0.4 mg total) under the tongue every 5 (five) minutes as needed for chest pain.   NovoLIN 70/30 FlexPen Relion (70-30) 100 UNIT/ML  KwikPen Generic drug: insulin isophane & regular human 30 units on waking up and 10 units at dinnertime, increase as directed, maximum dose 60 units daily What changed:   how much to take  additional instructions   ofloxacin 0.3 % ophthalmic solution Commonly known as: OCUFLOX   ondansetron 4 MG tablet Commonly known as: Zofran Take 1 tablet (4 mg total) by mouth every 8 (eight) hours as needed for nausea or vomiting.   pantoprazole 40 MG tablet Commonly known as: PROTONIX TAKE 1 TABLET(40 MG) BY MOUTH TWICE DAILY   phentermine 37.5 MG capsule Take 1 capsule (37.5 mg total) by mouth every morning.   polyethylene glycol 17 g packet Commonly known as: MIRALAX / GLYCOLAX Take 17 g by mouth daily as needed.   sulfamethoxazole-trimethoprim 400-80 MG tablet Commonly known as: BACTRIM Take 1 tablet by mouth 2 (two) times daily.   tamsulosin 0.4 MG Caps capsule Commonly known as: FLOMAX Take 0.4 mg by mouth daily.   traMADol 50 MG tablet Commonly known as: ULTRAM Take 1 tablet (50 mg total) by mouth every 6 (six) hours as needed.       Allergies:  Allergies  Allergen Reactions  . Ciprofloxacin Shortness Of Breath and Other (See Comments)    Dizziness   . Hydrocodone Shortness Of Breath and Other (See Comments)    Tachycardia  . Penicillins Hives    Pt just remembers hives Has patient had a PCN reaction causing immediate rash, facial/tongue/throat swelling, SOB or lightheadedness with hypotension: No Has patient had a PCN reaction causing severe rash involving mucus membranes or skin necrosis: No Has patient had a PCN reaction that required hospitalization No Has patient had a PCN reaction occurring within the last 10 years: No If all of the above answers are "NO", then may  proceed with Cephalosporin use.     Past Medical History:  Diagnosis Date  . Anxiety   . Arthritis   . Blood dyscrasia    "FREE BLEEDER"  . CERVICAL RADICULOPATHY, LEFT   . Chronic back  pain   . CKD (chronic kidney disease)    DR. [facility]  . COMMON MIGRAINE   . CORONARY ARTERY DISEASE   . Cough    CURRENT COLD  . Cystitis   . DEPRESSION   . Diabetes mellitus, type II ([place])   . DIVERTICULOSIS-COLON   . Dysrhythmia    palpitations  . Gastric ulcer 04/2008  . Gastroparesis   . GERD (gastroesophageal reflux disease)   . Hiatal hernia   . Hyperlipidemia   . Hypertension   . Hypothyroidism   . INSOMNIA-SLEEP DISORDER-UNSPEC   . Iron deficiency anemia   . Wears glasses     Past Surgical History:  Procedure Laterality Date  . ABDOMINAL HYSTERECTOMY    . ANTERIOR LAT LUMBAR FUSION Left 08/13/2017   Procedure: LEFT SIDED LUMBAR 3-4 LATERAL INTERBODY FUSION WITH INSTRUMENTATION AND ALLOGRAFT;  Surgeon: Phylliss Bob, MD;  Location: [facility];  Service: Orthopedics;  Laterality: Left;  LEFT SIDED LUMBAR 3-4 LATERAL INTERBODY FUSION WITH INSTRUMENTATION AND ALLOGRAFT; REQUEST 3 HOURS  . Back Stimulator  07/2018  . BACK SURGERY  03/2016  . BREAST EXCISIONAL BIOPSY Right    40 years ago  . CHOLECYSTECTOMY  06/2009  . COLONOSCOPY    . ESOPHAGOGASTRODUODENOSCOPY    . EYE SURGERY Bilateral    lasik  . Gastric Wedge resection lipoma  11/2007   x2 with laparotomy and gastrostomy  . JOINT REPLACEMENT    . Left knee replacement    . Rigth knee replacement with revision  04/2008   Dr. Berenice Primas  . ROTATOR CUFF REPAIR Left 01/2009  . s/p bladder surgury  09/2009   Dr. Terance Hart  . SPINAL CORD STIMULATOR INSERTION N/A 09/11/2018   Procedure: LUMBAR SPINAL CORD STIMULATOR INSERTION;  Surgeon: Clydell Hakim, MD;  Location: [facility];  Service: Neurosurgery;  Laterality: N/A;  LUMBAR SPINAL CORD STIMULATOR INSERTION    Family History  Problem Relation Age of Onset  . Diabetes Sister        x 3  . Heart disease Sister        x2  . Breast cancer Sister 34  . Diabetes Brother        x3  . Heart disease Brother        x2  . Coronary artery disease Other        female  1st degree  . Hypertension Other   . Breast cancer Sister 91  . Breast cancer Other 25  . Diabetes Brother   . Colon cancer Neg Hx     Social History:  reports that she has quit smoking. She has never used smokeless tobacco. She reports that she does not drink alcohol and does not use drugs.  Review of Systems:  Hypertension: Her blood pressure is treated with losartan 100 mg and clonidine 0.1 mg tablets, also on metoprolol  Followed by nephrologist and PCP  BP Readings from Last 3 Encounters:  10/03/20 130/78  08/14/20 (!) 150/84  07/13/20 138/84     CKD: Her creatinine is variable, followed by nephrologist Creatinine is higher as of 3/22  Lab Results  Component Value Date   CREATININE 2.17 (H) 07/06/2020   CREATININE 1.85 (H) 05/05/2020   CREATININE 1.48 (H) 04/27/2020  CREATININE 1.78 (H) 01/25/2020   Lab Results  Component Value Date   K 4.5 07/06/2020     Lipids: Followed by PCP, LDL has been variably controlled  Taking Lipitor 40 mg  Her lipids are much worse, does also have significant hypothyroidism on this visit  Lab Results  Component Value Date   CHOL 175 01/26/2020   HDL 56 01/26/2020   LDLCALC 105 (H) 01/26/2020   LDLDIRECT 87.0 01/25/2020   TRIG 71 01/26/2020   CHOLHDL 3.1 01/26/2020     Post ablative hypothyroidism:  Previously she had I-131 treatment on 08/20/11 for Graves' disease  Her dose has been adjusted periodically  She has been taking 125 mg levothyroxine prescription consistently and has been taking 1-1/2 on Saturdays and 1 pill on the other days   TSH was low normal previously and sugars told to reduce her regimen by half tablet weekly However for some reason her TSH was much higher in March and she was significantly symptomatic with fatigue Also was having some vomiting at that time  Now she is taking 1-1/2 tablets daily of the 125 mcg dose and has taken this consistently the mornings  Lab Results  Component Value Date    TSH 4.21 07/06/2020   TSH 0.72 04/27/2020   TSH 0.661 01/26/2020   FREET4 0.63 07/06/2020   FREET4 0.81 09/08/2019   FREET4 0.95 06/21/2019    On Gabapentin 300 mg for neuropathy    Examination:   BP 130/78   Pulse 78   Ht 5\' 6"  (1.676 m)   Wt 296 lb (134.3 kg)   SpO2 96%   BMI 47.78 kg/m   Body mass index is 47.78 kg/m.   No ankle edema evident   ASSESSMENT/ PLAN:   Diabetes type 2,  with obesity:   See history of present illness for detailed discussion of current management, blood sugar patterns and problems identified  A1C is recently 9%  Even though she had appeared to require a lot of insulin on her last visit and she was recommended a total of 90 units daily her blood sugars are much lower in the last few days This is despite taking only a total of 40 units of insulin a day May be benefiting from using premixed insulin instead of only basal and bolus  Her blood sugars still tend to be highest midday and afternoon but this is partly related to her not taking her premixed insulin before eating Fasting readings are still fairly good with 20 units of Lantus despite not using [place] She may have had some nausea with [place] which is resolving  Recommendations for diabetes:  She was advised to check blood sugars consistently at all different time Emphasized the need to take her premixed insulin well before starting to eat and she needs to tell her husband to remind her to do so; also advised her that late administration of her premixed insulin may cause nocturnal hypoglycemia She will also need to reduce Lantus to 18 units to prevent overnight hypoglycemia Blood sugar targets were discussed She will try to avoid high fat or high carbohydrate meals  Hypothyroidism: Taking 125 mcg, 1-1/2 tablets daily because of very high TSH Not clear if she was having some vomiting problem gastroparesis causing inadequate absorption Labs need to be checked  again  Hypertension/renal insufficiency: Losartan was stopped and needs follow-up labs Blood pressure is not as low    Patient Instructions  LANTUS 16 UNITS AND am sugar is < 90 then  reduce to 14  70/30 take at least 8 Units before 1st meal and 12 before pm meal daily Go up on dose as needed  No 70/30 after meals  Check blood sugars on waking up 7 days a week  Also check blood sugars about 2 hours after meals and do this after different meals by rotation  Recommended blood sugar levels on waking up are 90-130 and about 2 hours after meal is 130-160  Please bring your blood sugar monitor to each visit, thank you        Elayne Snare 10/03/2020, 2:15 PM   Note: This office note was prepared with Dragon voice recognition system technology. Any transcriptional errors that result from this process are unintentional.                                           [name] is a 69 y.o. female.    Reason for Appointment: follow-up   History of Present Illness   Diagnosis: Type 2 DIABETES MELITUS, date of diagnosis: 2000  Previous history: She had initially been treated with metformin and Amaryl and subsequently given Victoza which helped overall control and weight loss. In 9/13 because of significant hyperglycemia she was started on basal bolus insulin also. Had required relatively small doses of insulin. Her blood sugar control has been excellent with A1c between 5.3-6.0 although this may be relatively higher than expected. Tends to have relatively higher readings after supper In 6/15 she was told to resume her Victoza and stop her Lantus since she had been gaining weight with Lantus and NovoLog regimen.   Recent history:     INSULIN regimen: Lantus 20 units before Bfst, Novolin mix 10 bid   Non-insulin hypoglycemic drugs: None   Her A1c was last 9% No recent fructosamine available  Current blood sugar patterns and management:  She  has had much more variable blood sugars over the last month  She had been asked to increase her basal insulin to 30 units but more recently she started getting low and is now taking 20 units  Previously had a sample of [place] and now starting Lantus in the morning  She was also recommended Humalog mix insulin but because of cost is now taking OTC Novolin 70/30  She does not always take her premixed insulin before meals causing some significantly high readings after her first meal and occasionally in the evening  Last night after taking her premixed insulin 2 hours after eating her blood sugar got low in the night  However she is checking her blood sugars much more consistently than before with her freestyle libre  Not clear if this is accurate since she does not correlate this with fingersticks    Side effects from medications:  none   Glucometer:  Freestyle Libre/Accu-Chek   CONTINUOUS GLUCOSE MONITORING RECORD INTERPRETATION    Dates of Recording: 4/7 through 4/20  Sensor description: [name]  Results statistics:   CGM use % of time  88  Average and SD  172 GV 45  Time in range    50    %  % Time Above 180  23  % Time above 250  17  % Time Below target  10    PRE-MEAL Fasting Lunch Dinner Bedtime Overall  Glucose range:       Mean/median:  145   167  172   POST-MEAL PC Breakfast PC Lunch PC Dinner  Glucose range:     Mean/median:  217   176    Glycemic patterns summary: Blood sugars show moderate variability at all times with the highest readings early afternoon between 12 noon-4 PM but not consistent As discussed below she has occasional low episodes also  Hyperglycemic episodes have occurred occasionally overnight, periodically midday or early afternoon and a couple of times late evening  Hypoglycemic episodes occurred overnight on 2 nights has also once before 6 PM and another time after 8 PM  Overnight periods: Blood sugars are quite variable but in the last  4 nights are mostly near normal or slightly low  Preprandial periods: Mealtimes are inconsistent and difficult to identify patterns  Postprandial periods:   Blood sugars are generally going up significantly around midday after her first meal but only occasionally late evening   Previous data:      CGM use % of time  44  2-week average/SD  204, GV 35  Time in range  34     %, was 51  % Time Above 180  33  % Time above 250  29  % Time Below 70 4     PRE-MEAL Fasting Lunch Dinner Bedtime Overall  Glucose range:    224    Averages:  163     204   POST-MEAL PC Breakfast PC Lunch PC Dinner  Glucose range:     Averages:  213   276   Meals: 1-2 meals per day. Lunch 12 Supper at 5 PM            Physical activity: exercise: Limited, has had fatigue, back and leg pain.            Dietician visit: Most recent: 06/5092            Complications: are: None  Wt Readings from Last 3 Encounters:  10/03/20 296 lb (134.3 kg)  08/14/20 295 lb 12.8 oz (134.2 kg)  07/13/20 286 lb (129.7 kg)    LABS:  Lab Results  Component Value Date   HGBA1C 8.1 (A) 10/03/2020   HGBA1C 9.4 (H) 07/06/2020   HGBA1C 8.3 (A) 04/27/2020   Lab Results  Component Value Date   MICROALBUR 1.5 07/06/2020   LDLCALC 105 (H) 01/26/2020   CREATININE 2.17 (H) 07/06/2020   Lab Results  Component Value Date   FRUCTOSAMINE 452 (H) 01/25/2020   FRUCTOSAMINE 401 (H) 10/20/2019   FRUCTOSAMINE 369 (H) 09/08/2019     Other active problems: See review of systems   Office Visit on 10/03/2020  Component Date Value Ref Range Status  . Hemoglobin A1C 10/03/2020 8.1* 4.0 - 5.6 % Final    Allergies as of 10/03/2020      Reactions   Ciprofloxacin Shortness Of Breath, Other (See Comments)   Dizziness   Hydrocodone Shortness Of Breath, Other (See Comments)   Tachycardia   Penicillins Hives   Pt just remembers hives Has patient had a PCN reaction causing immediate rash, facial/tongue/throat swelling, SOB or  lightheadedness with hypotension: No Has patient had a PCN reaction causing severe rash involving mucus membranes or skin necrosis: No Has patient had a PCN reaction that required hospitalization No Has patient had a PCN reaction occurring within the last 10 years: No If all of the above answers are "NO", then may proceed with Cephalosporin use.      Medication List       Accurate as  of October 03, 2020  2:15 PM. If you have any questions, ask your nurse or doctor.        aspirin 81 MG EC tablet Take 81 mg by mouth every evening.   atorvastatin 40 MG tablet Commonly known as: LIPITOR TAKE ONE TABLET BY MOUTH EVERY NIGHT AT BEDTIME   buPROPion 150 MG 24 hr tablet Commonly known as: WELLBUTRIN XL TAKE 1 TABLET BY MOUTH DAILY   cephALEXin 500 MG capsule Commonly known as: KEFLEX Take 500 mg by mouth 2 (two) times daily.   chlorhexidine 0.12 % solution Commonly known as: PERIDEX See admin instructions.   cloNIDine 0.1 MG tablet Commonly known as: CATAPRES TAKE ONE TABLET BY MOUTH TWICE A DAY   Elmiron 100 MG capsule Generic drug: pentosan polysulfate Take 200 mg by mouth 2 (two) times daily.   erythromycin 250 MG tablet Commonly known as: E-MYCIN Take 250 mg by mouth 4 (four) times daily.   FLUoxetine 20 MG capsule Commonly known as: PROZAC TAKE ONE CAPSULE BY MOUTH DAILY   FreeStyle Libre 14 Day Reader Kerrin Mo by Does not apply route.   gabapentin 300 MG capsule Commonly known as: NEURONTIN Take 300 mg by mouth 3 (three) times daily.   gabapentin 600 MG tablet Commonly known as: NEURONTIN Take 600 mg by mouth 3 (three) times daily.   glucose blood test strip Commonly known as: FREESTYLE TEST STRIPS Use as instructed to check blood sugar 4 times daily.   HYDROcodone-acetaminophen 5-325 MG tablet Commonly known as: NORCO/VICODIN Take 1 tablet by mouth 4 (four) times daily.   isosorbide mononitrate 60 MG 24 hr tablet Commonly known as: IMDUR Take 1 tablet  (60 mg total) by mouth daily.   Lantus SoloStar 100 UNIT/ML Solostar Pen Generic drug: insulin glargine Inject 18 Units into the skin daily. What changed: how much to take   levothyroxine 125 MCG tablet Commonly known as: SYNTHROID Take 1 tablet by mouth daily, Sunday-Friday. On Saturday, take 1.5 tablets.   linaclotide 72 MCG capsule Commonly known as: Linzess Take 1 capsule (72 mcg total) by mouth daily before breakfast.   losartan 100 MG tablet Commonly known as: COZAAR TAKE 1 TABLET BY MOUTH DAILY   metoprolol succinate 50 MG 24 hr tablet Commonly known as: TOPROL-XL TAKE 1 TABLET BY MOUTH DAILY WITH OR IMMEDIATELY FOLLOWING A MEAL   nitroGLYCERIN 0.4 MG SL tablet Commonly known as: NITROSTAT Place 1 tablet (0.4 mg total) under the tongue every 5 (five) minutes as needed for chest pain.   NovoLIN 70/30 FlexPen Relion (70-30) 100 UNIT/ML KwikPen Generic drug: insulin isophane & regular human 30 units on waking up and 10 units at dinnertime, increase as directed, maximum dose 60 units daily What changed:   how much to take  additional instructions   ofloxacin 0.3 % ophthalmic solution Commonly known as: OCUFLOX   ondansetron 4 MG tablet Commonly known as: Zofran Take 1 tablet (4 mg total) by mouth every 8 (eight) hours as needed for nausea or vomiting.   pantoprazole 40 MG tablet Commonly known as: PROTONIX TAKE 1 TABLET(40 MG) BY MOUTH TWICE DAILY   phentermine 37.5 MG capsule Take 1 capsule (37.5 mg total) by mouth every morning.   polyethylene glycol 17 g packet Commonly known as: MIRALAX / GLYCOLAX Take 17 g by mouth daily as needed.   sulfamethoxazole-trimethoprim 400-80 MG tablet Commonly known as: BACTRIM Take 1 tablet by mouth 2 (two) times daily.   tamsulosin 0.4 MG Caps capsule Commonly known as:  FLOMAX Take 0.4 mg by mouth daily.   traMADol 50 MG tablet Commonly known as: ULTRAM Take 1 tablet (50 mg total) by mouth every 6 (six) hours as  needed.       Allergies:  Allergies  Allergen Reactions  . Ciprofloxacin Shortness Of Breath and Other (See Comments)    Dizziness   . Hydrocodone Shortness Of Breath and Other (See Comments)    Tachycardia  . Penicillins Hives    Pt just remembers hives Has patient had a PCN reaction causing immediate rash, facial/tongue/throat swelling, SOB or lightheadedness with hypotension: No Has patient had a PCN reaction causing severe rash involving mucus membranes or skin necrosis: No Has patient had a PCN reaction that required hospitalization No Has patient had a PCN reaction occurring within the last 10 years: No If all of the above answers are "NO", then may proceed with Cephalosporin use.     Past Medical History:  Diagnosis Date  . Anxiety   . Arthritis   . Blood dyscrasia    "FREE BLEEDER"  . CERVICAL RADICULOPATHY, LEFT   . Chronic back pain   . CKD (chronic kidney disease)    DR. SANFORD  [facility]  . COMMON MIGRAINE   . CORONARY ARTERY DISEASE   . Cough    CURRENT COLD  . Cystitis   . DEPRESSION   . Diabetes mellitus, type II ([place])   . DIVERTICULOSIS-COLON   . Dysrhythmia    palpitations  . Gastric ulcer 04/2008  . Gastroparesis   . GERD (gastroesophageal reflux disease)   . Hiatal hernia   . Hyperlipidemia   . Hypertension   . Hypothyroidism   . INSOMNIA-SLEEP DISORDER-UNSPEC   . Iron deficiency anemia   . Wears glasses     Past Surgical History:  Procedure Laterality Date  . ABDOMINAL HYSTERECTOMY    . ANTERIOR LAT LUMBAR FUSION Left 08/13/2017   Procedure: LEFT SIDED LUMBAR 3-4 LATERAL INTERBODY FUSION WITH INSTRUMENTATION AND ALLOGRAFT;  Surgeon: Phylliss Bob, MD;  Location: [place];  Service: Orthopedics;  Laterality: Left;  LEFT SIDED LUMBAR 3-4 LATERAL INTERBODY FUSION WITH INSTRUMENTATION AND ALLOGRAFT; REQUEST 3 HOURS  . Back Stimulator  07/2018  . BACK SURGERY  03/2016  . BREAST EXCISIONAL BIOPSY Right    40 years ago  . CHOLECYSTECTOMY   06/2009  . COLONOSCOPY    . ESOPHAGOGASTRODUODENOSCOPY    . EYE SURGERY Bilateral    lasik  . Gastric Wedge resection lipoma  11/2007   x2 with laparotomy and gastrostomy  . JOINT REPLACEMENT    . Left knee replacement    . Rigth knee replacement with revision  04/2008   Dr. Berenice Primas  . ROTATOR CUFF REPAIR Left 01/2009  . s/p bladder surgury  09/2009   Dr. Terance Hart  . SPINAL CORD STIMULATOR INSERTION N/A 09/11/2018   Procedure: LUMBAR SPINAL CORD STIMULATOR INSERTION;  Surgeon: Clydell Hakim, MD;  Location: [facility];  Service: Neurosurgery;  Laterality: N/A;  LUMBAR SPINAL CORD STIMULATOR INSERTION    Family History  Problem Relation Age of Onset  . Diabetes Sister        x 3  . Heart disease Sister        x2  . Breast cancer Sister 65  . Diabetes Brother        x3  . Heart disease Brother        x2  . Coronary artery disease Other        female 1st degree  .  Hypertension Other   . Breast cancer Sister 21  . Breast cancer Other 25  . Diabetes Brother   . Colon cancer Neg Hx     Social History:  reports that she has quit smoking. She has never used smokeless tobacco. She reports that she does not drink alcohol and does not use drugs.  Review of Systems:  Hypertension: Her blood pressure is treated with losartan 100 mg and clonidine 0.1 mg tablets, also on metoprolol  Followed by nephrologist and PCP  BP Readings from Last 3 Encounters:  10/03/20 130/78  08/14/20 (!) 150/84  07/13/20 138/84     CKD: Her creatinine is variable, followed by nephrologist Recent labs:  Lab Results  Component Value Date   CREATININE 2.17 (H) 07/06/2020   CREATININE 1.85 (H) 05/05/2020   CREATININE 1.48 (H) 04/27/2020   CREATININE 1.78 (H) 01/25/2020    Lipids: Followed by PCP, LDL has been variably controlled  Taking Lipitor 40 mg  LDL not consistently below 100  Lab Results  Component Value Date   CHOL 175 01/26/2020   HDL 56 01/26/2020   LDLCALC 105 (H) 01/26/2020    LDLDIRECT 87.0 01/25/2020   TRIG 71 01/26/2020   CHOLHDL 3.1 01/26/2020     Post ablative hypothyroidism:  Previously she had I-131 treatment on 08/20/11 for Graves' disease  Her dose has been adjusted periodically  She has been taking 125 mg levothyroxine   TSH is now nearly consistently normal with taking 1 tablet daily except 1-1/2 on weekends   Lab Results  Component Value Date   TSH 4.21 07/06/2020   TSH 0.72 04/27/2020   TSH 0.661 01/26/2020   FREET4 0.63 07/06/2020   FREET4 0.81 09/08/2019   FREET4 0.95 06/21/2019    On Gabapentin 300 mg for neuropathy  She has questions about excessive chapping of her lips but no dry mouth Is also having some balance problems recently   Examination:   BP 130/78   Pulse 78   Ht 5\' 6"  (1.676 m)   Wt 296 lb (134.3 kg)   SpO2 96%   BMI 47.78 kg/m   Body mass index is 47.78 kg/m.      ASSESSMENT/ PLAN:   Diabetes type 2,  with obesity:   See history of present illness for detailed discussion of current management, blood sugar patterns and problems identified  A1C is still high at 8.1 although has been as high as 9.4  She is insulin resistant and even with moderate amounts of carbohydrates and small meals her blood sugars are averaging over 200 between 12 noon and midnight However she is not checking blood sugars enough on some days She only likes to take basal insulin because she is afraid of hypoglycemia from the premixed insulin However she only takes this insulin when blood sugars are significantly high   Recommendations for diabetes:  She will need to take the Novolin brand 70/30 insulin before each meal which are usually twice a day  Given written instructions on starting with 8 units in the morning and 12 before evening meal regardless of blood sugar level before eating  She will not take any insulin postprandially  We will reduce her Lantus to at least 16 units to avoid overnight hypoglycemia  She can  only take Humalog if blood sugars are unusually high over 300 and no more than 6 to 8 units  Trial of Trulicity but will need to try to get her on a patient assistance program  Follow-up  in 6 weeks  Hypothyroidism: TSH will need to be checked   Total visit time =30 minutes including review of downloaded information on her freestyle libre, counseling, discussion about types of insulin, GLP-1 drugs, diet and blood sugar targets  Advised her to discuss balance issues with her PCP at the upcoming visit   Patient Instructions  LANTUS 16 UNITS AND am sugar is < 90 then reduce to 14  70/30 take at least 8 Units before 1st meal and 12 before pm meal daily Go up on dose as needed  No 70/30 after meals  Check blood sugars on waking up 7 days a week  Also check blood sugars about 2 hours after meals and do this after different meals by rotation  Recommended blood sugar levels on waking up are 90-130 and about 2 hours after meal is 130-160  Please bring your blood sugar monitor to each visit, thank you        Elayne Snare 10/03/2020, 2:15 PM   Note: This office note was prepared with Dragon voice recognition system technology. Any transcriptional errors that result from this process are unintentional.

## 2020-10-04 LAB — TSH: TSH: 54.19 u[IU]/mL — ABNORMAL HIGH (ref 0.35–4.50)

## 2020-10-09 ENCOUNTER — Telehealth: Payer: Self-pay | Admitting: *Deleted

## 2020-10-09 NOTE — Progress Notes (Signed)
Thyroid level is low on the 1 tablet daily of 125.  She will need to take extra half tablet on Monday/Wednesday/Fridays

## 2020-10-09 NOTE — Telephone Encounter (Signed)
Noted, patient is aware and verbalized understanding

## 2020-10-09 NOTE — Telephone Encounter (Signed)
She is getting too much Lantus insulin and needs to reduce dose.  Need to confirm how much she is taking

## 2020-10-09 NOTE — Telephone Encounter (Signed)
Need to know what doses and what types of insulin she has taken in the last 2 days.  Also need to know her blood sugars for the last 2 days

## 2020-10-09 NOTE — Telephone Encounter (Signed)
Sugars have been:  Today- 50             60             45 1-2- 6 am- 50         7 pm- 150 01/01- 150 She's taken her normal insulin doses

## 2020-10-09 NOTE — Telephone Encounter (Signed)
Patient states she is not taking any lantus, she's only taking her 70/30 insulin with her last meal.

## 2020-10-09 NOTE — Telephone Encounter (Signed)
She can stop the insulin for now.  Likely getting low sugars from the thyroid level being low.  I am certain she is not taking her thyroid medicine daily.  May be best that she start taking 1-1/2 tablets alternating with 1 tablet from tomorrow

## 2020-10-11 ENCOUNTER — Encounter: Payer: Self-pay | Admitting: Internal Medicine

## 2020-10-11 ENCOUNTER — Other Ambulatory Visit: Payer: Self-pay

## 2020-10-11 ENCOUNTER — Ambulatory Visit (INDEPENDENT_AMBULATORY_CARE_PROVIDER_SITE_OTHER): Payer: Medicare Other | Admitting: Internal Medicine

## 2020-10-11 VITALS — BP 118/78 | HR 59 | Temp 98.3°F | Ht 66.0 in | Wt 299.0 lb

## 2020-10-11 DIAGNOSIS — W19XXXA Unspecified fall, initial encounter: Secondary | ICD-10-CM

## 2020-10-11 DIAGNOSIS — I10 Essential (primary) hypertension: Secondary | ICD-10-CM

## 2020-10-11 DIAGNOSIS — R269 Unspecified abnormalities of gait and mobility: Secondary | ICD-10-CM | POA: Insufficient documentation

## 2020-10-11 DIAGNOSIS — E1165 Type 2 diabetes mellitus with hyperglycemia: Secondary | ICD-10-CM

## 2020-10-11 DIAGNOSIS — E7849 Other hyperlipidemia: Secondary | ICD-10-CM

## 2020-10-11 DIAGNOSIS — E039 Hypothyroidism, unspecified: Secondary | ICD-10-CM | POA: Diagnosis not present

## 2020-10-11 DIAGNOSIS — N184 Chronic kidney disease, stage 4 (severe): Secondary | ICD-10-CM | POA: Diagnosis not present

## 2020-10-11 MED ORDER — FREESTYLE LIBRE 2 READER DEVI: 0 refills | Status: AC

## 2020-10-11 MED ORDER — FREESTYLE LIBRE 2 SENSOR MISC: 3 refills | Status: AC

## 2020-10-11 NOTE — Patient Instructions (Signed)
Your new prescription for the Mike Craze was sent to the pharmacy  Please check your BP and heart rate at least once per day for 10 days, and let us know the results  Please check your blood sugar if you are able with any further episodes of extreme weakness or falls  Please call if you change your mind about the referral to the outpatient Physical Therapy for strength with walking with cane  Please continue all other medications as before, and refills have been done if requested.  Please have the pharmacy call with any other refills you may need.  Please continue your efforts at being more active, low cholesterol diet, and weight control.  Please keep your appointments with your specialists as you may have planned  We can hold on further lab work today  Please make an Appointment to return in 6 months, or sooner if needed

## 2020-10-11 NOTE — Assessment & Plan Note (Signed)
Lab Results  Component Value Date   CREATININE 1.62 (H) 10/03/2020  stable, cont to monitor with lab next visit

## 2020-10-11 NOTE — Assessment & Plan Note (Signed)
Stable, cont lantus, f/u endo as planned, ok for rx for libre2 glucometer to pharmacy

## 2020-10-11 NOTE — Assessment & Plan Note (Signed)
Walks with cane with ? Worsening generalzied weakness, pt declines referral outpt PT eval

## 2020-10-11 NOTE — Assessment & Plan Note (Signed)
Lab Results  Component Value Date   TSH 54.19 (H) 10/03/2020  with recent increased thyroid med, for f/u lab with endo f/u as planned

## 2020-10-11 NOTE — Assessment & Plan Note (Signed)
Etiology unclear, recent labs reviewed Lab Results  Component Value Date   WBC 6.2 05/05/2020   HGB 10.4 (L) 05/05/2020   HCT 32.5 (L) 05/05/2020   PLT 246.0 05/05/2020   GLUCOSE 116 (H) 10/03/2020   CHOL 175 01/26/2020   TRIG 71 01/26/2020   HDL 56 01/26/2020   LDLDIRECT 87.0 01/25/2020   LDLCALC 105 (H) 01/26/2020   ALT 19 10/03/2020   AST 27 10/03/2020   NA 136 10/03/2020   K 3.9 10/03/2020   CL 102 10/03/2020   CREATININE 1.62 (H) 10/03/2020   BUN 20 10/03/2020   CO2 27 10/03/2020   TSH 54.19 (H) 10/03/2020   INR 1.21 08/07/2017   HGBA1C 8.1 (A) 10/03/2020   MICROALBUR 1.5 07/06/2020  pt declines further lab today

## 2020-10-11 NOTE — Assessment & Plan Note (Signed)
?   Mild low BP - pt to check BP and HR once daily for 10 days and call with results

## 2020-10-11 NOTE — Progress Notes (Signed)
Established Patient Office Visit  Subjective:  Patient ID: [name], female    DOB: [birth year]  Age: 70 y.o. MRN: [id number]      Chief Complaint:  follow up HTN, HLD and hyperglycemia, fall, hypothyoridism, gait d/o        HPI:  [name] is a 70 y.o. female here to f/u; c/o recent fall x 1 last wk without obvious cause, was just trying to stand up from sitting, felt weak all over and just crumped to the floor, without striking anything or injury on falling.  Pt denies chest pain, increased sob or doe, wheezing, orthopnea, PND, increased LE swelling, palpitations, dizziness or syncope. Did not think to check the sugar when she fell, though thinks she has had several low sugar episodes over the past month.  Denies hyper or hypo thyroid symptoms such as voice, skin or hair change.  Has been following closely with endo.  Last tsh was quite high and further thyroid med added.  Also has ongoing knee arthritis pain, and chronic lbp, walks with cane, seems weaker overall recently, but declines referral for outpatient PT for now.  .  Pt denies new neurological symptoms such as new headache, or facial or extremity weakness or numbness.  No other new complaints.        Wt Readings from Last 3 Encounters:  10/11/20 299 lb (135.6 kg)  10/03/20 296 lb (134.3 kg)  08/14/20 295 lb 12.8 oz (134.2 kg)   BP Readings from Last 3 Encounters:  10/11/20 118/78  10/03/20 130/78  08/14/20 (!) 150/84         Past Medical History:  Diagnosis Date  . Anxiety   . Arthritis   . Blood dyscrasia    "FREE BLEEDER"  . CERVICAL RADICULOPATHY, LEFT   . Chronic back pain   . CKD (chronic kidney disease)    DR. SANFORD  [facility]  . COMMON MIGRAINE   . CORONARY ARTERY DISEASE   . Cough    CURRENT COLD  . Cystitis   . DEPRESSION   . Diabetes mellitus, type II ([place])   . DIVERTICULOSIS-COLON   . Dysrhythmia    palpitations  . Gastric ulcer 04/2008  . Gastroparesis   . GERD (gastroesophageal  reflux disease)   . Hiatal hernia   . Hyperlipidemia   . Hypertension   . Hypothyroidism   . INSOMNIA-SLEEP DISORDER-UNSPEC   . Iron deficiency anemia   . Wears glasses    Past Surgical History:  Procedure Laterality Date  . ABDOMINAL HYSTERECTOMY    . ANTERIOR LAT LUMBAR FUSION Left 08/13/2017   Procedure: LEFT SIDED LUMBAR 3-4 LATERAL INTERBODY FUSION WITH INSTRUMENTATION AND ALLOGRAFT;  Surgeon: Phylliss Bob, MD;  Location: [facility];  Service: Orthopedics;  Laterality: Left;  LEFT SIDED LUMBAR 3-4 LATERAL INTERBODY FUSION WITH INSTRUMENTATION AND ALLOGRAFT; REQUEST 3 HOURS  . Back Stimulator  07/2018  . BACK SURGERY  03/2016  . BREAST EXCISIONAL BIOPSY Right    40 years ago  . CHOLECYSTECTOMY  06/2009  . COLONOSCOPY    . ESOPHAGOGASTRODUODENOSCOPY    . EYE SURGERY Bilateral    lasik  . Gastric Wedge resection lipoma  11/2007   x2 with laparotomy and gastrostomy  . JOINT REPLACEMENT    . Left knee replacement    . Rigth knee replacement with revision  04/2008   Dr. Berenice Primas  . ROTATOR CUFF REPAIR Left 01/2009  . s/p bladder surgury  09/2009   Dr. Terance Hart  .  SPINAL CORD STIMULATOR INSERTION N/A 09/11/2018   Procedure: LUMBAR SPINAL CORD STIMULATOR INSERTION;  Surgeon: Clydell Hakim, MD;  Location: [facility];  Service: Neurosurgery;  Laterality: N/A;  LUMBAR SPINAL CORD STIMULATOR INSERTION    reports that she has quit smoking. She has never used smokeless tobacco. She reports that she does not drink alcohol and does not use drugs. family history includes Breast cancer (age of onset: 31) in an other family member; Breast cancer (age of onset: 77) in her sister; Breast cancer (age of onset: 18) in her sister; Coronary artery disease in an other family member; Diabetes in her brother, brother, and sister; Heart disease in her brother and sister; Hypertension in an other family member. Allergies  Allergen Reactions  . Ciprofloxacin Shortness Of Breath and Other (See Comments)    Dizziness    . Hydrocodone Shortness Of Breath and Other (See Comments)    Tachycardia  . Penicillins Hives    Pt just remembers hives Has patient had a PCN reaction causing immediate rash, facial/tongue/throat swelling, SOB or lightheadedness with hypotension: No Has patient had a PCN reaction causing severe rash involving mucus membranes or skin necrosis: No Has patient had a PCN reaction that required hospitalization No Has patient had a PCN reaction occurring within the last 10 years: No If all of the above answers are "NO", then may proceed with Cephalosporin use.    Current Outpatient Medications on File Prior to Visit  Medication Sig Dispense Refill  . aspirin 81 MG EC tablet Take 81 mg by mouth every evening.    Marland Kitchen atorvastatin (LIPITOR) 40 MG tablet TAKE ONE TABLET BY MOUTH EVERY NIGHT AT BEDTIME 90 tablet 3  . buPROPion (WELLBUTRIN XL) 150 MG 24 hr tablet TAKE 1 TABLET BY MOUTH DAILY 90 tablet 3  . chlorhexidine (PERIDEX) 0.12 % solution See admin instructions.    . cloNIDine (CATAPRES) 0.1 MG tablet TAKE ONE TABLET BY MOUTH TWICE A DAY 60 tablet 2  . ELMIRON 100 MG capsule Take 200 mg by mouth 2 (two) times daily.     Marland Kitchen erythromycin (E-MYCIN) 250 MG tablet Take 250 mg by mouth 4 (four) times daily.    Marland Kitchen FLUoxetine (PROZAC) 20 MG capsule TAKE ONE CAPSULE BY MOUTH DAILY 90 capsule 3  . gabapentin (NEURONTIN) 300 MG capsule Take 300 mg by mouth 3 (three) times daily.     Marland Kitchen gabapentin (NEURONTIN) 600 MG tablet Take 600 mg by mouth 3 (three) times daily.    Marland Kitchen glucose blood (FREESTYLE TEST STRIPS) test strip Use as instructed to check blood sugar 4 times daily. 100 each 3  . HYDROcodone-acetaminophen (NORCO/VICODIN) 5-325 MG tablet Take 1 tablet by mouth 4 (four) times daily.    . insulin glargine (LANTUS SOLOSTAR) 100 UNIT/ML Solostar Pen Inject 18 Units into the skin daily. (Patient taking differently: Inject 20 Units into the skin daily.) 15 mL 2  . isosorbide mononitrate (IMDUR) 60 MG 24 hr  tablet Take 1 tablet (60 mg total) by mouth daily. 90 tablet 3  . levothyroxine (SYNTHROID) 125 MCG tablet Take 1 tablet by mouth daily, Sunday-Friday. On Saturday, take 1.5 tablets. 120 tablet 1  . linaclotide (LINZESS) 72 MCG capsule Take 1 capsule (72 mcg total) by mouth daily before breakfast. 30 capsule 11  . losartan (COZAAR) 100 MG tablet TAKE 1 TABLET BY MOUTH DAILY 90 tablet 3  . metoprolol succinate (TOPROL-XL) 50 MG 24 hr tablet TAKE 1 TABLET BY MOUTH DAILY WITH OR IMMEDIATELY FOLLOWING A  MEAL 30 tablet 1  . nitroGLYCERIN (NITROSTAT) 0.4 MG SL tablet Place 1 tablet (0.4 mg total) under the tongue every 5 (five) minutes as needed for chest pain. 20 tablet 0  . ofloxacin (OCUFLOX) 0.3 % ophthalmic solution     . ondansetron (ZOFRAN) 4 MG tablet Take 1 tablet (4 mg total) by mouth every 8 (eight) hours as needed for nausea or vomiting. 30 tablet 1  . pantoprazole (PROTONIX) 40 MG tablet TAKE 1 TABLET(40 MG) BY MOUTH TWICE DAILY 180 tablet 3  . polyethylene glycol (MIRALAX / GLYCOLAX) 17 g packet Take 17 g by mouth daily as needed. 14 each 0  . tamsulosin (FLOMAX) 0.4 MG CAPS capsule Take 0.4 mg by mouth daily.     . traMADol (ULTRAM) 50 MG tablet Take 1 tablet (50 mg total) by mouth every 6 (six) hours as needed. 8 tablet 0   No current facility-administered medications on file prior to visit.        ROS:  All others reviewed and negative.  Objective        PE:  BP 118/78   Pulse (!) 59   Temp 98.3 F (36.8 C) (Oral)   Ht 5\' 6"  (1.676 m)   Wt 299 lb (135.6 kg)   SpO2 98%   BMI 48.26 kg/m                 Constitutional: Pt appears in NAD               HENT: Head: NCAT.                Right Ear: External ear normal.                 Left Ear: External ear normal.                Eyes: . Pupils are equal, round, and reactive to light. Conjunctivae and EOM are normal               Nose: without d/c or deformity               Neck: Neck supple. Gross normal ROM                Cardiovascular: Normal rate and regular rhythm.                 Pulmonary/Chest: Effort normal and breath sounds without rales or wheezing.                Abd:  Soft, NT, ND, + BS, no organomegaly               Neurological: Pt is alert. At baseline orientation, motor grossly intact               Skin: Skin is warm. No rashes, no other new lesions, LE edema - none               Psychiatric: Pt behavior is normal without agitation   Assessment/Plan:  [name] is a 70 y.o. Black or African American [2] female with  has a past medical history of Anxiety, Arthritis, Blood dyscrasia, CERVICAL RADICULOPATHY, LEFT, Chronic back pain, CKD (chronic kidney disease), COMMON MIGRAINE, CORONARY ARTERY DISEASE, Cough, Cystitis, DEPRESSION, Diabetes mellitus, type II ([place]), DIVERTICULOSIS-COLON, Dysrhythmia, Gastric ulcer (04/2008), Gastroparesis, GERD (gastroesophageal reflux disease), Hiatal hernia, Hyperlipidemia, Hypertension, Hypothyroidism, INSOMNIA-SLEEP DISORDER-UNSPEC, Iron deficiency anemia, and Wears glasses.   Assessment Plan  See problem  oriented assessment and plan Labs reviewed for each problem: Lab Results  Component Value Date   WBC 6.2 05/05/2020   HGB 10.4 (L) 05/05/2020   HCT 32.5 (L) 05/05/2020   PLT 246.0 05/05/2020   GLUCOSE 116 (H) 10/03/2020   CHOL 175 01/26/2020   TRIG 71 01/26/2020   HDL 56 01/26/2020   LDLDIRECT 87.0 01/25/2020   LDLCALC 105 (H) 01/26/2020   ALT 19 10/03/2020   AST 27 10/03/2020   NA 136 10/03/2020   K 3.9 10/03/2020   CL 102 10/03/2020   CREATININE 1.62 (H) 10/03/2020   BUN 20 10/03/2020   CO2 27 10/03/2020   TSH 54.19 (H) 10/03/2020   INR 1.21 08/07/2017   HGBA1C 8.1 (A) 10/03/2020   MICROALBUR 1.5 07/06/2020    Micro: none  Cardiac tracings I have personally interpreted today:  none  Pertinent Radiological findings (summarize): none    There are no preventive care reminders to display for this patient.  There are no preventive  care reminders to display for this patient.  Lab Results  Component Value Date   TSH 54.19 (H) 10/03/2020   Lab Results  Component Value Date   WBC 6.2 05/05/2020   HGB 10.4 (L) 05/05/2020   HCT 32.5 (L) 05/05/2020   MCV 98.5 05/05/2020   PLT 246.0 05/05/2020   Lab Results  Component Value Date   NA 136 10/03/2020   K 3.9 10/03/2020   CO2 27 10/03/2020   GLUCOSE 116 (H) 10/03/2020   BUN 20 10/03/2020   CREATININE 1.62 (H) 10/03/2020   BILITOT 0.8 10/03/2020   ALKPHOS 78 10/03/2020   AST 27 10/03/2020   ALT 19 10/03/2020   PROT 7.7 10/03/2020   ALBUMIN 4.1 10/03/2020   CALCIUM 9.7 10/03/2020   ANIONGAP 8 01/25/2020   GFR 31.97 (L) 10/03/2020   Lab Results  Component Value Date   CHOL 175 01/26/2020   Lab Results  Component Value Date   HDL 56 01/26/2020   Lab Results  Component Value Date   LDLCALC 105 (H) 01/26/2020   Lab Results  Component Value Date   TRIG 71 01/26/2020   Lab Results  Component Value Date   CHOLHDL 3.1 01/26/2020   Lab Results  Component Value Date   HGBA1C 8.1 (A) 10/03/2020      Assessment & Plan:   Problem List Items Addressed This Visit      High   Gait disorder    Walks with cane with ? Worsening generalzied weakness, pt declines referral outpt PT eval      Fall    Etiology unclear, recent labs reviewed Lab Results  Component Value Date   WBC 6.2 05/05/2020   HGB 10.4 (L) 05/05/2020   HCT 32.5 (L) 05/05/2020   PLT 246.0 05/05/2020   GLUCOSE 116 (H) 10/03/2020   CHOL 175 01/26/2020   TRIG 71 01/26/2020   HDL 56 01/26/2020   LDLDIRECT 87.0 01/25/2020   LDLCALC 105 (H) 01/26/2020   ALT 19 10/03/2020   AST 27 10/03/2020   NA 136 10/03/2020   K 3.9 10/03/2020   CL 102 10/03/2020   CREATININE 1.62 (H) 10/03/2020   BUN 20 10/03/2020   CO2 27 10/03/2020   TSH 54.19 (H) 10/03/2020   INR 1.21 08/07/2017   HGBA1C 8.1 (A) 10/03/2020   MICROALBUR 1.5 07/06/2020  pt declines further lab today        Medium    Hypothyroidism    Lab Results  Component Value  Date   TSH 54.19 (H) 10/03/2020  with recent increased thyroid med, for f/u lab with endo f/u as planned      HLD (hyperlipidemia)    Lab Results  Component Value Date   LDLCALC 105 (H) 01/26/2020  mild elevated, declines f/u lab today, cont lipitor 40      Essential hypertension    ? Mild low BP - pt to check BP and HR once daily for 10 days and call with results      Diabetes ([place]) - Primary    Stable, cont lantus, f/u endo as planned, ok for rx for libre2 glucometer to pharmacy      CKD (chronic kidney disease) stage 4, GFR 15-29 ml/min ([facility])    Lab Results  Component Value Date   CREATININE 1.62 (H) 10/03/2020  stable, cont to monitor with lab next visit         Meds ordered this encounter  Medications  . Continuous Blood Gluc Receiver (FREESTYLE LIBRE 2 READER) DEVI    Sig: Use as directed twice daily E11.9    Dispense:  1 each    Refill:  0  . Continuous Blood Gluc Sensor (FREESTYLE LIBRE 2 SENSOR) MISC    Sig: Use as directed once bi-weekly E11.9    Dispense:  6 each    Refill:  3    Follow-up: Return in about 6 months (around 04/10/2021).     Cathlean Cower, MD 10/11/2020 4:26 PM [facility]

## 2020-10-11 NOTE — Assessment & Plan Note (Signed)
Lab Results  Component Value Date   LDLCALC 105 (H) 01/26/2020  mild elevated, declines f/u lab today, cont lipitor 40

## 2020-10-19 DIAGNOSIS — R296 Repeated falls: Secondary | ICD-10-CM | POA: Insufficient documentation

## 2020-10-30 ENCOUNTER — Telehealth: Payer: Self-pay | Admitting: Endocrinology

## 2020-10-30 NOTE — Telephone Encounter (Signed)
Pt called stating her blood sugar is running very low (95) and needs to speak to someone regarding this.   Ph# [phone]

## 2020-10-31 NOTE — Telephone Encounter (Signed)
She should have an Accu-Chek from before or she can cross check with her husband's meter also.  If her sugars are not consistent she can move up her appointment to 2 weeks from now

## 2020-10-31 NOTE — Telephone Encounter (Signed)
No answer when I tried calling her back.

## 2020-10-31 NOTE — Telephone Encounter (Signed)
Patient said since Sunday morning her glucose readings have just been randomly jumping, when she woke up she said it was 56, then went to 95 and by Sunday evening it was at 170.  She states that she has not taken any insulin and is getting very frustrated.  I asked her if she had another meter that she could check her sugars against to make sure it was accurate and she said she has one but doesn't know where its at?

## 2020-11-03 NOTE — Telephone Encounter (Signed)
Please check to see if she still is having issues with her sugar and comparison with the Accu-Chek meter

## 2020-11-09 ENCOUNTER — Encounter (HOSPITAL_COMMUNITY): Payer: Self-pay | Admitting: Emergency Medicine

## 2020-11-09 ENCOUNTER — Emergency Department (HOSPITAL_COMMUNITY): Payer: Medicare Other

## 2020-11-09 ENCOUNTER — Other Ambulatory Visit: Payer: Self-pay

## 2020-11-09 ENCOUNTER — Inpatient Hospital Stay (HOSPITAL_COMMUNITY)
Admission: EM | Admit: 2020-11-09 | Discharge: 2020-11-11 | DRG: 871 | Disposition: A | Discharge status: Home Health | Payer: Medicare Other | Attending: Internal Medicine | Admitting: Internal Medicine

## 2020-11-09 DIAGNOSIS — R739 Hyperglycemia, unspecified: Secondary | ICD-10-CM | POA: Diagnosis not present

## 2020-11-09 DIAGNOSIS — Y92003 Bedroom of unspecified non-institutional (private) residence as the place of occurrence of the external cause: Secondary | ICD-10-CM | POA: Diagnosis not present

## 2020-11-09 DIAGNOSIS — E1165 Type 2 diabetes mellitus with hyperglycemia: Secondary | ICD-10-CM | POA: Diagnosis present

## 2020-11-09 DIAGNOSIS — A419 Sepsis, unspecified organism: Principal | ICD-10-CM | POA: Diagnosis present

## 2020-11-09 DIAGNOSIS — I2510 Atherosclerotic heart disease of native coronary artery without angina pectoris: Secondary | ICD-10-CM | POA: Diagnosis present

## 2020-11-09 DIAGNOSIS — K469 Unspecified abdominal hernia without obstruction or gangrene: Secondary | ICD-10-CM | POA: Diagnosis not present

## 2020-11-09 DIAGNOSIS — G43009 Migraine without aura, not intractable, without status migrainosus: Secondary | ICD-10-CM | POA: Diagnosis present

## 2020-11-09 DIAGNOSIS — I10 Essential (primary) hypertension: Secondary | ICD-10-CM | POA: Diagnosis not present

## 2020-11-09 DIAGNOSIS — K3184 Gastroparesis: Secondary | ICD-10-CM | POA: Diagnosis present

## 2020-11-09 DIAGNOSIS — N179 Acute kidney failure, unspecified: Secondary | ICD-10-CM | POA: Diagnosis present

## 2020-11-09 DIAGNOSIS — Z79891 Long term (current) use of opiate analgesic: Secondary | ICD-10-CM

## 2020-11-09 DIAGNOSIS — E119 Type 2 diabetes mellitus without complications: Secondary | ICD-10-CM

## 2020-11-09 DIAGNOSIS — F32A Depression, unspecified: Secondary | ICD-10-CM | POA: Diagnosis present

## 2020-11-09 DIAGNOSIS — I152 Hypertension secondary to endocrine disorders: Secondary | ICD-10-CM | POA: Diagnosis present

## 2020-11-09 DIAGNOSIS — G929 Unspecified toxic encephalopathy: Secondary | ICD-10-CM | POA: Diagnosis present

## 2020-11-09 DIAGNOSIS — G9341 Metabolic encephalopathy: Secondary | ICD-10-CM | POA: Diagnosis present

## 2020-11-09 DIAGNOSIS — T40425A Adverse effect of tramadol, initial encounter: Secondary | ICD-10-CM | POA: Diagnosis present

## 2020-11-09 DIAGNOSIS — N184 Chronic kidney disease, stage 4 (severe): Secondary | ICD-10-CM | POA: Diagnosis present

## 2020-11-09 DIAGNOSIS — Z981 Arthrodesis status: Secondary | ICD-10-CM

## 2020-11-09 DIAGNOSIS — Z20822 Contact with and (suspected) exposure to covid-19: Secondary | ICD-10-CM | POA: Diagnosis present

## 2020-11-09 DIAGNOSIS — Z87440 Personal history of urinary (tract) infections: Secondary | ICD-10-CM | POA: Diagnosis not present

## 2020-11-09 DIAGNOSIS — I129 Hypertensive chronic kidney disease with stage 1 through stage 4 chronic kidney disease, or unspecified chronic kidney disease: Secondary | ICD-10-CM | POA: Diagnosis present

## 2020-11-09 DIAGNOSIS — Z880 Allergy status to penicillin: Secondary | ICD-10-CM

## 2020-11-09 DIAGNOSIS — E785 Hyperlipidemia, unspecified: Secondary | ICD-10-CM | POA: Diagnosis present

## 2020-11-09 DIAGNOSIS — Z833 Family history of diabetes mellitus: Secondary | ICD-10-CM

## 2020-11-09 DIAGNOSIS — R410 Disorientation, unspecified: Secondary | ICD-10-CM | POA: Diagnosis not present

## 2020-11-09 DIAGNOSIS — R404 Transient alteration of awareness: Secondary | ICD-10-CM | POA: Diagnosis not present

## 2020-11-09 DIAGNOSIS — F419 Anxiety disorder, unspecified: Secondary | ICD-10-CM | POA: Diagnosis present

## 2020-11-09 DIAGNOSIS — K5790 Diverticulosis of intestine, part unspecified, without perforation or abscess without bleeding: Secondary | ICD-10-CM | POA: Diagnosis not present

## 2020-11-09 DIAGNOSIS — E1159 Type 2 diabetes mellitus with other circulatory complications: Secondary | ICD-10-CM | POA: Diagnosis present

## 2020-11-09 DIAGNOSIS — E1122 Type 2 diabetes mellitus with diabetic chronic kidney disease: Secondary | ICD-10-CM | POA: Diagnosis present

## 2020-11-09 DIAGNOSIS — E039 Hypothyroidism, unspecified: Secondary | ICD-10-CM | POA: Diagnosis present

## 2020-11-09 DIAGNOSIS — R6520 Severe sepsis without septic shock: Secondary | ICD-10-CM | POA: Diagnosis present

## 2020-11-09 DIAGNOSIS — N189 Chronic kidney disease, unspecified: Secondary | ICD-10-CM | POA: Diagnosis present

## 2020-11-09 DIAGNOSIS — E860 Dehydration: Secondary | ICD-10-CM | POA: Diagnosis present

## 2020-11-09 DIAGNOSIS — M545 Low back pain, unspecified: Secondary | ICD-10-CM | POA: Diagnosis present

## 2020-11-09 DIAGNOSIS — N3000 Acute cystitis without hematuria: Secondary | ICD-10-CM | POA: Diagnosis present

## 2020-11-09 DIAGNOSIS — E1143 Type 2 diabetes mellitus with diabetic autonomic (poly)neuropathy: Secondary | ICD-10-CM | POA: Diagnosis present

## 2020-11-09 DIAGNOSIS — Z90710 Acquired absence of both cervix and uterus: Secondary | ICD-10-CM

## 2020-11-09 DIAGNOSIS — Z87891 Personal history of nicotine dependence: Secondary | ICD-10-CM

## 2020-11-09 DIAGNOSIS — W06XXXA Fall from bed, initial encounter: Secondary | ICD-10-CM | POA: Diagnosis present

## 2020-11-09 DIAGNOSIS — G8929 Other chronic pain: Secondary | ICD-10-CM | POA: Diagnosis present

## 2020-11-09 DIAGNOSIS — Z885 Allergy status to narcotic agent status: Secondary | ICD-10-CM

## 2020-11-09 DIAGNOSIS — N390 Urinary tract infection, site not specified: Secondary | ICD-10-CM | POA: Diagnosis present

## 2020-11-09 DIAGNOSIS — Z79899 Other long term (current) drug therapy: Secondary | ICD-10-CM

## 2020-11-09 DIAGNOSIS — Z9114 Patient's other noncompliance with medication regimen: Secondary | ICD-10-CM

## 2020-11-09 DIAGNOSIS — R4182 Altered mental status, unspecified: Secondary | ICD-10-CM | POA: Diagnosis not present

## 2020-11-09 DIAGNOSIS — Z7982 Long term (current) use of aspirin: Secondary | ICD-10-CM

## 2020-11-09 DIAGNOSIS — E1169 Type 2 diabetes mellitus with other specified complication: Secondary | ICD-10-CM | POA: Diagnosis present

## 2020-11-09 DIAGNOSIS — Z8249 Family history of ischemic heart disease and other diseases of the circulatory system: Secondary | ICD-10-CM

## 2020-11-09 DIAGNOSIS — Z8711 Personal history of peptic ulcer disease: Secondary | ICD-10-CM

## 2020-11-09 DIAGNOSIS — K219 Gastro-esophageal reflux disease without esophagitis: Secondary | ICD-10-CM | POA: Diagnosis present

## 2020-11-09 DIAGNOSIS — G9340 Encephalopathy, unspecified: Secondary | ICD-10-CM | POA: Diagnosis not present

## 2020-11-09 DIAGNOSIS — G4700 Insomnia, unspecified: Secondary | ICD-10-CM | POA: Diagnosis present

## 2020-11-09 DIAGNOSIS — Z794 Long term (current) use of insulin: Secondary | ICD-10-CM

## 2020-11-09 DIAGNOSIS — K439 Ventral hernia without obstruction or gangrene: Secondary | ICD-10-CM | POA: Diagnosis not present

## 2020-11-09 DIAGNOSIS — Z9049 Acquired absence of other specified parts of digestive tract: Secondary | ICD-10-CM

## 2020-11-09 DIAGNOSIS — Z79890 Hormone replacement therapy (postmenopausal): Secondary | ICD-10-CM

## 2020-11-09 DIAGNOSIS — Z8673 Personal history of transient ischemic attack (TIA), and cerebral infarction without residual deficits: Secondary | ICD-10-CM

## 2020-11-09 DIAGNOSIS — Z881 Allergy status to other antibiotic agents status: Secondary | ICD-10-CM

## 2020-11-09 LAB — PROTIME-INR
INR: 1.3 — ABNORMAL HIGH (ref 0.8–1.2)
Prothrombin Time: 15.3 seconds — ABNORMAL HIGH (ref 11.4–15.2)

## 2020-11-09 LAB — COMPREHENSIVE METABOLIC PANEL
ALT: 25 U/L (ref 0–44)
AST: 25 U/L (ref 15–41)
Albumin: 4 g/dL (ref 3.5–5.0)
Alkaline Phosphatase: 99 U/L (ref 38–126)
Anion gap: 15 (ref 5–15)
BUN: 21 mg/dL (ref 8–23)
CO2: 20 mmol/L — ABNORMAL LOW (ref 22–32)
Calcium: 9.9 mg/dL (ref 8.9–10.3)
Chloride: 96 mmol/L — ABNORMAL LOW (ref 98–111)
Creatinine, Ser: 1.94 mg/dL — ABNORMAL HIGH (ref 0.44–1.00)
GFR, Estimated: 27 mL/min — ABNORMAL LOW (ref >60)
Glucose, Bld: 456 mg/dL — ABNORMAL HIGH (ref 70–99)
Potassium: 5.1 mmol/L (ref 3.5–5.1)
Sodium: 131 mmol/L — ABNORMAL LOW (ref 135–145)
Total Bilirubin: 1.4 mg/dL — ABNORMAL HIGH (ref 0.3–1.2)
Total Protein: 8.4 g/dL — ABNORMAL HIGH (ref 6.5–8.1)

## 2020-11-09 LAB — CBC WITH DIFFERENTIAL/PLATELET
Abs Immature Granulocytes: 0.03 10*3/uL (ref 0.00–0.07)
Basophils Absolute: 0 10*3/uL (ref 0.0–0.1)
Basophils Relative: 0 %
Eosinophils Absolute: 0 10*3/uL (ref 0.0–0.5)
Eosinophils Relative: 0 %
HCT: 38.4 % (ref 36.0–46.0)
Hemoglobin: 11.8 g/dL — ABNORMAL LOW (ref 12.0–15.0)
Immature Granulocytes: 0 %
Lymphocytes Relative: 8 %
Lymphs Abs: 0.7 10*3/uL (ref 0.7–4.0)
MCH: 31.6 pg (ref 26.0–34.0)
MCHC: 30.7 g/dL (ref 30.0–36.0)
MCV: 102.9 fL — ABNORMAL HIGH (ref 80.0–100.0)
Monocytes Absolute: 0.5 10*3/uL (ref 0.1–1.0)
Monocytes Relative: 5 %
Neutro Abs: 7.8 10*3/uL — ABNORMAL HIGH (ref 1.7–7.7)
Neutrophils Relative %: 87 %
Platelets: 226 10*3/uL (ref 150–400)
RBC: 3.73 MIL/uL — ABNORMAL LOW (ref 3.87–5.11)
RDW: 12.7 % (ref 11.5–15.5)
WBC: 9.1 10*3/uL (ref 4.0–10.5)
nRBC: 0 % (ref 0.0–0.2)

## 2020-11-09 LAB — I-STAT VENOUS BLOOD GAS, ED
Acid-base deficit: 1 mmol/L (ref 0.0–2.0)
Bicarbonate: 22.1 mmol/L (ref 20.0–28.0)
Calcium, Ion: 1.01 mmol/L — ABNORMAL LOW (ref 1.15–1.40)
HCT: 39 % (ref 36.0–46.0)
Hemoglobin: 13.3 g/dL (ref 12.0–15.0)
O2 Saturation: 74 %
Potassium: 5 mmol/L (ref 3.5–5.1)
Sodium: 132 mmol/L — ABNORMAL LOW (ref 135–145)
TCO2: 23 mmol/L (ref 22–32)
pCO2, Ven: 31.4 mmHg — ABNORMAL LOW (ref 44.0–60.0)
pH, Ven: 7.456 — ABNORMAL HIGH (ref 7.250–7.430)
pO2, Ven: 37 mmHg (ref 32.0–45.0)

## 2020-11-09 LAB — URINALYSIS, ROUTINE W REFLEX MICROSCOPIC
Bilirubin Urine: NEGATIVE
Glucose, UA: >=500 mg/dL — AB
Ketones, ur: 5 mg/dL — AB
Leukocytes,Ua: NEGATIVE
Nitrite: NEGATIVE
Protein, ur: 30 mg/dL — AB
Specific Gravity, Urine: 1.015 (ref 1.005–1.030)
pH: 7 (ref 5.0–8.0)

## 2020-11-09 LAB — TSH: TSH: 45.164 u[IU]/mL — ABNORMAL HIGH (ref 0.350–4.500)

## 2020-11-09 LAB — SARS CORONAVIRUS 2 BY RT PCR (HOSPITAL ORDER, PERFORMED IN ~~LOC~~ HOSPITAL LAB): SARS Coronavirus 2: NEGATIVE

## 2020-11-09 LAB — CBG MONITORING, ED
Glucose-Capillary: 474 mg/dL — ABNORMAL HIGH (ref 70–99)
Glucose-Capillary: 294 mg/dL — ABNORMAL HIGH (ref 70–99)

## 2020-11-09 LAB — LACTIC ACID, PLASMA: Lactic Acid, Venous: 2.8 mmol/L (ref 0.5–1.9)

## 2020-11-09 MED ORDER — INSULIN GLARGINE 100 UNIT/ML ~~LOC~~ SOLN
10.0000 [IU] | Freq: Every day | SUBCUTANEOUS | Status: DC
  Administered 2020-11-09 – 2020-11-10 (×2): 10 [IU] via SUBCUTANEOUS
  Filled 2020-11-09 (×4): qty 0.1

## 2020-11-09 MED ORDER — CLONIDINE HCL 0.1 MG PO TABS
0.1000 mg | ORAL_TABLET | Freq: Two times a day (BID) | ORAL | Status: DC
  Administered 2020-11-10 – 2020-11-11 (×3): 0.1 mg via ORAL
  Filled 2020-11-09 (×3): qty 1

## 2020-11-09 MED ORDER — INSULIN ASPART 100 UNIT/ML ~~LOC~~ SOLN
8.0000 [IU] | Freq: Once | SUBCUTANEOUS | Status: AC
  Administered 2020-11-09: 8 [IU] via INTRAVENOUS

## 2020-11-09 MED ORDER — SODIUM CHLORIDE 0.9 % IV SOLN
1.0000 g | Freq: Once | INTRAVENOUS | Status: AC
  Administered 2020-11-09: 1 g via INTRAVENOUS
  Filled 2020-11-09: qty 10

## 2020-11-09 MED ORDER — METOPROLOL SUCCINATE ER 50 MG PO TB24
50.0000 mg | ORAL_TABLET | Freq: Every day | ORAL | Status: DC
  Administered 2020-11-10 – 2020-11-11 (×2): 50 mg via ORAL
  Filled 2020-11-09: qty 2
  Filled 2020-11-09 (×2): qty 1

## 2020-11-09 MED ORDER — INSULIN ASPART 100 UNIT/ML ~~LOC~~ SOLN
0.0000 [IU] | SUBCUTANEOUS | Status: DC
  Administered 2020-11-09: 8 [IU] via SUBCUTANEOUS
  Administered 2020-11-10 (×2): 3 [IU] via SUBCUTANEOUS
  Administered 2020-11-10: 5 [IU] via SUBCUTANEOUS
  Administered 2020-11-10 – 2020-11-11 (×2): 3 [IU] via SUBCUTANEOUS
  Administered 2020-11-11: 8 [IU] via SUBCUTANEOUS
  Administered 2020-11-11 (×2): 5 [IU] via SUBCUTANEOUS
  Administered 2020-11-11: 3 [IU] via SUBCUTANEOUS

## 2020-11-09 MED ORDER — ENOXAPARIN SODIUM 40 MG/0.4ML ~~LOC~~ SOLN
40.0000 mg | Freq: Every day | SUBCUTANEOUS | Status: DC
  Administered 2020-11-09 – 2020-11-10 (×2): 40 mg via SUBCUTANEOUS
  Filled 2020-11-09 (×2): qty 0.4

## 2020-11-09 MED ORDER — ONDANSETRON HCL 4 MG PO TABS: 4.0000 mg | ORAL_TABLET | Freq: Four times a day (QID) | ORAL | Status: DC | PRN

## 2020-11-09 MED ORDER — SENNOSIDES-DOCUSATE SODIUM 8.6-50 MG PO TABS: 1.0000 | ORAL_TABLET | Freq: Every evening | ORAL | Status: DC | PRN

## 2020-11-09 MED ORDER — ATORVASTATIN CALCIUM 40 MG PO TABS
40.0000 mg | ORAL_TABLET | Freq: Every day | ORAL | Status: DC
  Administered 2020-11-10: 40 mg via ORAL
  Filled 2020-11-09: qty 1

## 2020-11-09 MED ORDER — ISOSORBIDE MONONITRATE ER 60 MG PO TB24
60.0000 mg | ORAL_TABLET | Freq: Every day | ORAL | Status: DC
  Administered 2020-11-10 – 2020-11-11 (×2): 60 mg via ORAL
  Filled 2020-11-09 (×2): qty 1
  Filled 2020-11-09: qty 2

## 2020-11-09 MED ORDER — LACTATED RINGERS IV SOLN: INTRAVENOUS | Status: AC

## 2020-11-09 MED ORDER — SODIUM CHLORIDE 0.9 % IV BOLUS
1000.0000 mL | Freq: Once | INTRAVENOUS | Status: AC
  Administered 2020-11-09: 1000 mL via INTRAVENOUS

## 2020-11-09 MED ORDER — LEVOTHYROXINE SODIUM 25 MCG PO TABS
125.0000 ug | ORAL_TABLET | ORAL | Status: DC
  Administered 2020-11-10: 125 ug via ORAL

## 2020-11-09 MED ORDER — ACETAMINOPHEN 650 MG RE SUPP
650.0000 mg | Freq: Once | RECTAL | Status: AC
  Administered 2020-11-09: 650 mg via RECTAL
  Filled 2020-11-09: qty 1

## 2020-11-09 MED ORDER — LEVOTHYROXINE SODIUM 75 MCG PO TABS
187.5000 ug | ORAL_TABLET | ORAL | Status: DC
  Filled 2020-11-09: qty 3

## 2020-11-09 MED ORDER — ONDANSETRON HCL 4 MG/2ML IJ SOLN: 4.0000 mg | Freq: Four times a day (QID) | INTRAMUSCULAR | Status: DC | PRN

## 2020-11-09 MED ORDER — ACETAMINOPHEN 325 MG PO TABS
650.0000 mg | ORAL_TABLET | Freq: Four times a day (QID) | ORAL | Status: DC | PRN
  Administered 2020-11-10 (×3): 650 mg via ORAL
  Filled 2020-11-09 (×3): qty 2

## 2020-11-09 MED ORDER — ACETAMINOPHEN 650 MG RE SUPP: 650.0000 mg | Freq: Four times a day (QID) | RECTAL | Status: DC | PRN

## 2020-11-09 MED ORDER — ASPIRIN EC 81 MG PO TBEC
81.0000 mg | DELAYED_RELEASE_TABLET | Freq: Every day | ORAL | Status: DC
  Administered 2020-11-10 – 2020-11-11 (×2): 81 mg via ORAL
  Filled 2020-11-09 (×3): qty 1

## 2020-11-09 MED ORDER — SODIUM CHLORIDE 0.9 % IV SOLN
1.0000 g | INTRAVENOUS | Status: DC
  Administered 2020-11-10: 1 g via INTRAVENOUS
  Filled 2020-11-09: qty 10
  Filled 2020-11-09: qty 1

## 2020-11-09 NOTE — H&P (Signed)
History and Physical    [name] AVW:[id number] DOB: October 01, 1951 DOA: 11/09/2020  PCP: Biagio Borg, MD  Patient coming from: Home  I have personally briefly reviewed patient's old medical records in [place]  Chief Complaint: Altered mental status  HPI: [name] is a 70 y.o. female with medical history significant for IDT2DM, CKD stage III-IV, CAD, HTN, HLD, hypothyroidism, depression/anxiety, chronic back pain s/p spinal cord stimulator who presents to the ED for evaluation of altered mental status. Unable to obtain history from patient due to encephalopathy and history is otherwise obtained from EDP, chart review, and husband by phone.  Patient was in her usual state of health until around 4 PM on 11/08/2020.  Husband states that she appeared to be very sleepy with altered mental status.  They went to bed last night but this morning symptoms continued.  She was not communicating as she normally would kept falling asleep despite him trying to wake her up.  He states that she has not been taking her medications for the last couple of days and thinks that she might not have been taking her thyroid medicine for at least a couple weeks.  ED Course:  Initial vitals showed BP 183/106, pulse 94, RR 23, temp 100.91F, SPO2 98% on room air.  Rectal temperature was 100.7 F.  Labs show WBC 9.1, hemoglobin 11.8, platelets 226,000, sodium 131, potassium 5.1, bicarb 20, BUN 21, creatinine 1.94 (previously 1.62 on 10/03/2020), serum glucose 456, AST 25, ALT 25, alk phos 99, total bilirubin 1.4, lactic acid 2.8, TSH 45.164, INR 1.3.  VBG showed pH 7.456, PCO2 31.4, PO2 37.  Urinalysis with negative nitrites, negative leukocytes, many bacteria on microscopy.  Urine and blood cultures obtained and pending.  SARS-CoV-2 PCR is negative.  Portable chest x-ray negative for focal consolidation, edema, or effusion.  CT head without contrast is negative for acute intracranial abnormality.   Tiny remote lacunar infarct in the left basal ganglia noted.  CT abdomen/pelvis without contrast shows diverticulosis without diverticulitis.  Multiple hernias seen and stable compared to prior exam.  Left lateral abdominal wall hernia demonstrates a loop of descending colon within although no obstructive changes noted per radiology read.  Patient was given 1 L normal saline, 8 units IV NovoLog, and IV ceftriaxone.  The hospitalist service was consulted to admit for further evaluation and management.  Review of Systems:  Unable to obtain full review of systems due to encephalopathy.   Past Medical History:  Diagnosis Date  . Anxiety   . Arthritis   . Blood dyscrasia    "FREE BLEEDER"  . CERVICAL RADICULOPATHY, LEFT   . Chronic back pain   . CKD (chronic kidney disease)    DR. [facility]  . COMMON MIGRAINE   . CORONARY ARTERY DISEASE   . Cough    CURRENT COLD  . Cystitis   . DEPRESSION   . Diabetes mellitus, type II ([place])   . DIVERTICULOSIS-COLON   . Dysrhythmia    palpitations  . Gastric ulcer 04/2008  . Gastroparesis   . GERD (gastroesophageal reflux disease)   . Hiatal hernia   . Hyperlipidemia   . Hypertension   . Hypothyroidism   . INSOMNIA-SLEEP DISORDER-UNSPEC   . Iron deficiency anemia   . Wears glasses     Past Surgical History:  Procedure Laterality Date  . ABDOMINAL HYSTERECTOMY    . ANTERIOR LAT LUMBAR FUSION Left 08/13/2017   Procedure: LEFT SIDED LUMBAR 3-4  LATERAL INTERBODY FUSION WITH INSTRUMENTATION AND ALLOGRAFT;  Surgeon: Phylliss Bob, MD;  Location: [facility];  Service: Orthopedics;  Laterality: Left;  LEFT SIDED LUMBAR 3-4 LATERAL INTERBODY FUSION WITH INSTRUMENTATION AND ALLOGRAFT; REQUEST 3 HOURS  . Back Stimulator  07/2018  . BACK SURGERY  03/2016  . BREAST EXCISIONAL BIOPSY Right    40 years ago  . CHOLECYSTECTOMY  06/2009  . COLONOSCOPY    . ESOPHAGOGASTRODUODENOSCOPY    . EYE SURGERY Bilateral    lasik  . Gastric Wedge  resection lipoma  11/2007   x2 with laparotomy and gastrostomy  . JOINT REPLACEMENT    . Left knee replacement    . Rigth knee replacement with revision  04/2008   Dr. Berenice Primas  . ROTATOR CUFF REPAIR Left 01/2009  . s/p bladder surgury  09/2009   Dr. Terance Hart  . SPINAL CORD STIMULATOR INSERTION N/A 09/11/2018   Procedure: LUMBAR SPINAL CORD STIMULATOR INSERTION;  Surgeon: Clydell Hakim, MD;  Location: [facility];  Service: Neurosurgery;  Laterality: N/A;  LUMBAR SPINAL CORD STIMULATOR INSERTION    Social History:  reports that she has quit smoking. She has never used smokeless tobacco. She reports that she does not drink alcohol and does not use drugs.  Allergies  Allergen Reactions  . Ciprofloxacin Shortness Of Breath and Other (See Comments)    Dizziness   . Hydrocodone Shortness Of Breath and Other (See Comments)    Tachycardia  . Penicillins Hives    Pt just remembers hives Has patient had a PCN reaction causing immediate rash, facial/tongue/throat swelling, SOB or lightheadedness with hypotension: No Has patient had a PCN reaction causing severe rash involving mucus membranes or skin necrosis: No Has patient had a PCN reaction that required hospitalization No Has patient had a PCN reaction occurring within the last 10 years: No If all of the above answers are "NO", then may proceed with Cephalosporin use.     Family History  Problem Relation Age of Onset  . Diabetes Sister        x 3  . Heart disease Sister        x2  . Breast cancer Sister 27  . Diabetes Brother        x3  . Heart disease Brother        x2  . Coronary artery disease Other        female 1st degree  . Hypertension Other   . Breast cancer Sister 62  . Breast cancer Other 25  . Diabetes Brother   . Colon cancer Neg Hx      Prior to Admission medications   Medication Sig Start Date End Date Taking? Authorizing Provider  aspirin 81 MG EC tablet Take 81 mg by mouth every evening.    [provider]   atorvastatin (LIPITOR) 40 MG tablet TAKE ONE TABLET BY MOUTH EVERY NIGHT AT BEDTIME 06/13/20   Biagio Borg, MD  buPROPion (WELLBUTRIN XL) 150 MG 24 hr tablet TAKE 1 TABLET BY MOUTH DAILY 03/10/20   Biagio Borg, MD  chlorhexidine (PERIDEX) 0.12 % solution See admin instructions. 02/14/20   [provider]  cloNIDine (CATAPRES) 0.1 MG tablet TAKE ONE TABLET BY MOUTH TWICE A DAY 09/13/20   Elayne Snare, MD  Continuous Blood Gluc Receiver (FREESTYLE LIBRE 2 READER) DEVI Use as directed twice daily E11.9 10/11/20   Biagio Borg, MD  Continuous Blood Gluc Sensor (FREESTYLE LIBRE 2 SENSOR) MISC Use as directed once bi-weekly E11.9 10/11/20  Biagio Borg, MD  ELMIRON 100 MG capsule Take 200 mg by mouth 2 (two) times daily.  07/20/13   [provider]  erythromycin (E-MYCIN) 250 MG tablet Take 250 mg by mouth 4 (four) times daily. 02/14/20   [provider]  FLUoxetine (PROZAC) 20 MG capsule TAKE ONE CAPSULE BY MOUTH DAILY 02/21/20   Biagio Borg, MD  gabapentin (NEURONTIN) 600 MG tablet Take 600 mg by mouth 3 (three) times daily. 01/24/20   [provider]  glucose blood (FREESTYLE TEST STRIPS) test strip Use as instructed to check blood sugar 4 times daily. 04/20/18   Elayne Snare, MD  HYDROcodone-acetaminophen (NORCO/VICODIN) 5-325 MG tablet Take 1 tablet by mouth 4 (four) times daily. 02/09/20   [provider]  insulin glargine (LANTUS SOLOSTAR) 100 UNIT/ML Solostar Pen Inject 18 Units into the skin daily. Patient taking differently: Inject 20 Units into the skin daily. 03/08/20   Elayne Snare, MD  isosorbide mononitrate (IMDUR) 60 MG 24 hr tablet Take 1 tablet (60 mg total) by mouth daily. 02/21/20   Biagio Borg, MD  levothyroxine (SYNTHROID) 125 MCG tablet Take 1 tablet by mouth daily, Sunday-Friday. On Saturday, take 1.5 tablets. 02/22/20   Elayne Snare, MD  linaclotide Rolan Lipa) 72 MCG capsule Take 1 capsule (72 mcg total) by mouth daily before breakfast. 01/25/19    Ladene Artist, MD  losartan (COZAAR) 100 MG tablet TAKE 1 TABLET BY MOUTH DAILY 05/22/20   Biagio Borg, MD  metoprolol succinate (TOPROL-XL) 50 MG 24 hr tablet TAKE 1 TABLET BY MOUTH DAILY WITH OR IMMEDIATELY FOLLOWING A MEAL 10/02/20   Biagio Borg, MD  nitroGLYCERIN (NITROSTAT) 0.4 MG SL tablet Place 1 tablet (0.4 mg total) under the tongue every 5 (five) minutes as needed for chest pain. 01/27/20   Ghimire, Henreitta Leber, MD  ofloxacin (OCUFLOX) 0.3 % ophthalmic solution  08/04/20   [provider]  ondansetron (ZOFRAN) 4 MG tablet Take 1 tablet (4 mg total) by mouth every 8 (eight) hours as needed for nausea or vomiting. 12/30/19   Biagio Borg, MD  pantoprazole (PROTONIX) 40 MG tablet TAKE 1 TABLET(40 MG) BY MOUTH TWICE DAILY 02/21/20   Biagio Borg, MD  polyethylene glycol (MIRALAX / GLYCOLAX) 17 g packet Take 17 g by mouth daily as needed. 01/27/20   Ghimire, Henreitta Leber, MD  tamsulosin (FLOMAX) 0.4 MG CAPS capsule Take 0.4 mg by mouth daily.  03/20/20   [provider]  traMADol (ULTRAM) 50 MG tablet Take 1 tablet (50 mg total) by mouth every 6 (six) hours as needed. 05/02/19   Scot Jun, FNP    Physical Exam: Vitals:   11/09/20 2045 11/09/20 2100 11/09/20 2115 11/09/20 2137  BP: (!) 178/91 (!) 181/97 (!) 157/100 (!) 162/91  Pulse: 88 90 83 93  Resp: 12 (!) _0 Temp:      TempSrc:      SpO2: 95% 98% 96% 100%  Weight:      Height:       Exam limited from patient due to acute encephalopathy. Constitutional: Resting supine in bed, somnolent appears in NAD, calm, comfortable Eyes: PERRL, lids and conjunctivae normal ENMT: Mucous membranes are moist. Posterior pharynx clear of any exudate or lesions.Normal dentition.  Neck: normal, supple, no masses. Respiratory: clear to auscultation anteriorly. Normal respiratory effort. No accessory muscle use.  Cardiovascular: Regular rate and rhythm, no murmurs / rubs / gallops. No extremity edema. 2+ pedal  pulses.  Abdomen: no tenderness, no masses palpated.  Bowel sounds positive.  Musculoskeletal: no clubbing / cyanosis. No joint deformity upper and lower extremities. no contractures.  Skin: no rashes, lesions, ulcers. No induration Neurologic: Limited exam due to encephalopathy/somnolence. Will transiently awaken to verbal and physical stimuli but quickly returns to sleep. Psychiatric: Somnolent, translated awakens to verbal and physical stimuli and looks around the room but does not answer questions, follow commands and quickly returns to sleep.  Labs on Admission: I have personally reviewed following labs and imaging studies  CBC: Recent Labs  Lab 11/09/20 1947 11/09/20 1949  WBC 9.1  --   NEUTROABS 7.8*  --   HGB 11.8* 13.3  HCT 38.4 39.0  MCV 102.9*  --   PLT 226  --    Basic Metabolic Panel: Recent Labs  Lab 11/09/20 1947 11/09/20 1949  NA 131* 132*  K 5.1 5.0  CL 96*  --   CO2 20*  --   GLUCOSE 456*  --   BUN 21  --   CREATININE 1.94*  --   CALCIUM 9.9  --    GFR: Estimated Creatinine Clearance: 38.3 mL/min (A) (by C-G formula based on SCr of 1.94 mg/dL (H)). Liver Function Tests: Recent Labs  Lab 11/09/20 1947  AST 25  ALT 25  ALKPHOS 99  BILITOT 1.4*  PROT 8.4*  ALBUMIN 4.0   No results for input(s): LIPASE, AMYLASE in the last 168 hours. No results for input(s): AMMONIA in the last 168 hours. Coagulation Profile: Recent Labs  Lab 11/09/20 1947  INR 1.3*   Cardiac Enzymes: No results for input(s): CKTOTAL, CKMB, CKMBINDEX, TROPONINI in the last 168 hours. BNP (last 3 results) No results for input(s): PROBNP in the last 8760 hours. HbA1C: No results for input(s): HGBA1C in the last 72 hours. CBG: Recent Labs  Lab 11/09/20 1810  GLUCAP 474*   Lipid Profile: No results for input(s): CHOL, HDL, LDLCALC, TRIG, CHOLHDL, LDLDIRECT in the last 72 hours. Thyroid Function Tests: Recent Labs    11/09/20 2030  TSH 45.164*   Anemia Panel: No  results for input(s): VITAMINB12, FOLATE, FERRITIN, TIBC, IRON, RETICCTPCT in the last 72 hours. Urine analysis:    Component Value Date/Time   COLORURINE STRAW (A) 11/09/2020 2033   APPEARANCEUR CLEAR 11/09/2020 2033   LABSPEC 1.015 11/09/2020 2033   PHURINE 7.0 11/09/2020 2033   GLUCOSEU >=500 (A) 11/09/2020 2033   GLUCOSEU 100 (A) 05/05/2020 1720   HGBUR MODERATE (A) 11/09/2020 2033   BILIRUBINUR NEGATIVE 11/09/2020 2033   BILIRUBINUR negative 04/11/2018 1308   KETONESUR 5 (A) 11/09/2020 2033   PROTEINUR 30 (A) 11/09/2020 2033   UROBILINOGEN 0.2 05/05/2020 1720   NITRITE NEGATIVE 11/09/2020 2033   LEUKOCYTESUR NEGATIVE 11/09/2020 2033    Radiological Exams on Admission: CT ABDOMEN PELVIS WO CONTRAST  Result Date: 11/09/2020 CLINICAL DATA:  Altered mental status and hyperglycemia EXAM: CT ABDOMEN AND PELVIS WITHOUT CONTRAST TECHNIQUE: Multidetector CT imaging of the abdomen and pelvis was performed following the standard protocol without IV contrast. COMPARISON:  04/05/18 FINDINGS: Lower chest: Lung bases are free of acute infiltrate or sizable effusion. Hepatobiliary: No focal liver abnormality is seen. Status post cholecystectomy. No biliary dilatation. Pancreas: Unremarkable. No pancreatic ductal dilatation or surrounding inflammatory changes. Spleen: Normal in size without focal abnormality. Adrenals/Urinary Tract: Adrenal glands are within normal limits. Kidneys demonstrate no renal calculi or obstructive changes. No definitive mass is seen. The bladder is partially distended. Stomach/Bowel: Scattered diverticular changes noted without evidence  of diverticulitis. A left lateral abdominal wall hernia is again identified containing fat although a loop of descending colon now lies within this hernia as well. The appendix is within normal limits. No inflammatory changes are seen. Small bowel is within normal limits. Postsurgical changes in the stomach are noted. Vascular/Lymphatic: Aortic  atherosclerosis. No enlarged abdominal or pelvic lymph nodes. Reproductive: Status post hysterectomy. No adnexal masses. Other: Fat containing left inguinal hernia is noted and stable. Previously described left lateral abdominal wall hernia is noted containing a loop of colon although no obstructive changes are seen. Small fat containing supraumbilical hernia is noted as well. No free fluid is noted. Musculoskeletal: Postsurgical changes are seen in the lumbar spine. Spinal stimulator is noted as well. IMPRESSION: Diverticulosis without diverticulitis. Multiple hernias stable in appearance from the prior exam. The left lateral abdominal wall hernia demonstrates a loop of descending colon within although no obstructive changes are noted. Electronically Signed   By: Inez Catalina M.D.   On: 11/09/2020 21:46   CT Head Wo Contrast  Result Date: 11/09/2020 CLINICAL DATA:  Altered mental status.  Confusion. EXAM: CT HEAD WITHOUT CONTRAST TECHNIQUE: Contiguous axial images were obtained from the base of the skull through the vertex without intravenous contrast. COMPARISON:  None. FINDINGS: Brain: No intracranial hemorrhage, mass effect, or midline shift. Brain volume is normal for age. No hydrocephalus. The basilar cisterns are patent. 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 No evidence of territorial infarct or acute ischemia. No extra-axial or intracranial fluid collection. Vascular: Atherosclerosis of skullbase vasculature without hyperdense vessel or abnormal calcification. Skull: No fracture or focal lesion. Sinuses/Orbits: No acute findings. Left cataract resection. Retained tooth in the left maxillary sinus. Mastoid air cells are clear. Other: None. IMPRESSION: 1. No acute intracranial abnormality. 2. 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 Electronically Signed   By: Keith Rake M.D.   On: 11/09/2020 20:07   DG Chest Portable 1 View  Result Date: 11/09/2020 CLINICAL DATA:   Encephalopathy EXAM: PORTABLE CHEST 1 VIEW COMPARISON:  01/26/2020 FINDINGS: The heart size and mediastinal contours are within normal limits. Both lungs are clear. The visualized skeletal structures are unremarkable. Spinal stimulator leads project over the midthoracic spine. IMPRESSION: No active disease. Electronically Signed   By: Ulyses Jarred M.D.   On: 11/09/2020 19:11    EKG: Personally reviewed.  Sinus rhythm without acute ischemic changes  Assessment/Plan Principal Problem:   Severe sepsis (HCC) Active Problems:   Hypertension associated with diabetes ([place])   Hypothyroidism   Acute kidney injury superimposed on CKD (HCC)   Insulin dependent type 2 diabetes mellitus ([place])   Hyperlipidemia associated with type 2 diabetes mellitus ([place])   Acute metabolic encephalopathy   Acute lower UTI  [name] is a 70 y.o. female with medical history significant for IDT2DM, CKD stage III-IV, CAD, HTN, HLD, hypothyroidism, depression/anxiety who is admitted with severe sepsis due to UTI.  Severe sepsis due to UTI: Present on arrival with fever up to 100.7 F, tachypnea with RR up to 23, pulse 94, lactic acid 2.8, and urinalysis suggestive of UTI. -Continue empiric IV ceftriaxone -Follow blood and urine cultures -Continue IV fluid resuscitation overnight  AKI on CKD stage III-IV: Multifactorial from sepsis and medication effect.  Continue IV fluid hydration as above.  Hold home losartan-HCTZ.  Acute metabolic and toxic encephalopathy: Multifactorial due to sepsis and likely medication effect from tramadol.  Continue management as above.  Insulin-dependent type 2 diabetes  with hyperglycemia: Last A1c 8.1% on 10/03/2020.  Per outpatient records has been difficult to control.  Continue Lantus 10 units nightly and place on moderate SSI q4h while NPO.  Hypothyroidism: TSH remains very high at 45.164.  There is suspicion that patient has not been taking home Synthroid.  Will resume  Synthroid at previous dose.  Follow-up with endocrinology as an outpatient.  CAD: Continue aspirin, Imdur, atorvastatin, Toprol-XL.  Hypertension: Resume home clonidine, Imdur, Toprol-XL when able to take p.o.  Holding losartan-HCTZ in setting of AKI.  Hyperlipidemia: Continue atorvastatin.  DVT prophylaxis: Lovenox Code Status: Full code, confirmed with patient's husband Family Communication: Discussed with patient's husband by phone Disposition Plan: From home and likely discharge to home pending improvement in sepsis physiology, ability to transition to oral antibiotics, and improvement in encephalopathy. Consults called: None Level of care: Med-Surg Admission status:  Status is: Inpatient  Remains inpatient appropriate because:Altered mental status, IV treatments appropriate due to intensity of illness or inability to take PO and Inpatient level of care appropriate due to severity of illness   Dispo: The patient is from: Home              Anticipated d/c is to: Home              Anticipated d/c date is: 2 days              Patient currently is not medically stable to d/c.   Difficult to place patient No   Zada Finders MD [facility]  If 7PM-7AM, please contact night-coverage [email or web address]  11/09/2020, 10:10 PM

## 2020-11-09 NOTE — ED Triage Notes (Signed)
Patient arrives to ED with c/o altered mental status. Per husband pts LKN was yesterday at 4pm. Pt has been confused and slow to be respond. Pts initial blood sugar was greater than  500 with EMS. Pt without one sided weakness, vision or sensory deficits. Pt is however slow to respond and A&O x1.

## 2020-11-09 NOTE — ED Notes (Signed)
Patient transported to CT 

## 2020-11-09 NOTE — ED Provider Notes (Signed)
[name]ERG[facility] DE[facility]MENT Provider Note   CSN: [id number] Arrival date & time: 11/09/20  1759     History Chief Complaint  Patient presents with  . Altered Mental Status    [name] is a 70 y.o. female.  Pt presents to the ED today with AMS.   The pt lives with her husband.  She was last known normal at 1600 yesterday.  It is unclear why EMS was not called earlier, but she has been confused all day.  She has not taken any of her medications today.  She slid off the bed onto the floor and family was unable to get her up which prompted them to call EMS this evening. Pt is unable to give any hx.  Pt is able to tell me her name, but that is all she will say.          Past Medical History:  Diagnosis Date  . Anxiety   . Arthritis   . Blood dyscrasia    "FREE BLEEDER"  . CERVICAL RADICULOPATHY, LEFT   . Chronic back pain   . CKD (chronic kidney disease)    DR. SANFORD  [facility]  . COMMON MIGRAINE   . CORONARY ARTERY DISEASE   . Cough    CURRENT COLD  . Cystitis   . DEPRESSION   . Diabetes mellitus, type II ([place])   . DIVERTICULOSIS-COLON   . Dysrhythmia    palpitations  . Gastric ulcer 04/2008  . Gastroparesis   . GERD (gastroesophageal reflux disease)   . Hiatal hernia   . Hyperlipidemia   . Hypertension   . Hypothyroidism   . INSOMNIA-SLEEP DISORDER-UNSPEC   . Iron deficiency anemia   . Wears glasses     Patient Active Problem List   Diagnosis Date Noted  . Severe sepsis ([place]) 11/09/2020  . Hyperlipidemia associated with type 2 diabetes mellitus ([place]) 11/09/2020  . Acute metabolic encephalopathy 99991111  . Acute lower UTI 11/09/2020  . Gait disorder 10/11/2020  . Fall 10/11/2020  . RLQ abdominal pain 05/05/2020  . Low grade fever 02/04/2020  . Chest pain 01/26/2020  . Rectal bleeding 09/01/2019  . History of peptic ulcer disease 09/01/2019  . Screening for viral disease 09/01/2019  . Insulin dependent type 2  diabetes mellitus ([place]) 09/01/2019  . Pyelonephritis 06/05/2019  . Recurrent UTI 06/05/2019  . Urinary incontinence 06/05/2019  . Nausea & vomiting 02/08/2019  . Abdominal pain 02/08/2019  . Constipation 02/08/2019  . UTI (urinary tract infection) 02/08/2019  . LUQ pain 01/21/2019  . Cervical radiculopathy at C6 01/28/2018  . Left lateral epicondylitis 01/23/2018  . Obesity 01/23/2018  . Weakness 05/01/2017  . Hypothyroidism 05/01/2017  . Hyperkalemia 05/01/2017  . Acute kidney injury superimposed on CKD ([place]) 05/01/2017  . Hypotension 05/01/2017  . Sepsis ([place]) 05/01/2017  . Dysuria 04/23/2017  . Right leg swelling 12/27/2016  . Leg pain, lateral, right 11/05/2016  . Radiculopathy 04/03/2016  . Hypersomnolence 11/23/2015  . Right sided abdominal pain 07/25/2015  . Malaise and fatigue 11/05/2014  . Fracture of fifth metacarpal bone of right hand 07/08/2014  . Right cervical radiculopathy 06/27/2014  . Chronic interstitial cystitis 05/23/2014  . Osteoarthritis of [place] joint of thumb 04/11/2014  . De Quervain's tenosynovitis, left 03/14/2014  . CKD (chronic kidney disease) stage 4, GFR 15-29 ml/min ([facility]) 03/03/2014  . Lower back pain 03/03/2014  . Ingrown nail 03/03/2014  . Peripheral edema 02/23/2014  . Lateral epicondylitis of right elbow  01/31/2014  . Neck pain on left side 11/19/2013  . Abdominal discomfort 10/12/2012  . Right lumbar radiculopathy 05/11/2012  . Peripheral neuropathy 05/11/2012  . Vertigo 03/29/2012  . Headache(784.0) 03/10/2012  . Lumbar radiculopathy 10/24/2011  . 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 Right leg weakness 04/04/2011  . Hematochezia 01/07/2011  . Preventative health care 01/07/2011  . CHOLELITHIASIS 05/04/2009  . GERD 04/26/2009  . Gastroparesis 04/26/2009  . ULCER-GASTRIC 03/15/2009  . DIVERTICULOSIS-COLON 03/15/2009  . Cervical radiculopathy 10/28/2008  . MENOPAUSAL DISORDER 06/29/2008  . Pain in joint, shoulder region  10/12/2007  . INSOMNIA-SLEEP DISORDER-UNSPEC 09/01/2007  . Depression 09/01/2007  . ANEMIA-NOS 08/13/2007  . COMMON MIGRAINE 08/13/2007  . 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 Diabetes ([place]) 05/06/2007  . HLD (hyperlipidemia) 05/06/2007  . ANXIETY 05/06/2007  . Hypertension associated with diabetes ([place]) 05/06/2007  . Coronary atherosclerosis 05/06/2007    Past Surgical History:  Procedure Laterality Date  . ABDOMINAL HYSTERECTOMY    . ANTERIOR LAT LUMBAR FUSION Left 08/13/2017   Procedure: LEFT SIDED LUMBAR 3-4 LATERAL INTERBODY FUSION WITH INSTRUMENTATION AND ALLOGRAFT;  Surgeon: Phylliss Bob, MD;  Location: [facility];  Service: Orthopedics;  Laterality: Left;  LEFT SIDED LUMBAR 3-4 LATERAL INTERBODY FUSION WITH INSTRUMENTATION AND ALLOGRAFT; REQUEST 3 HOURS  . Back Stimulator  07/2018  . BACK SURGERY  03/2016  . BREAST EXCISIONAL BIOPSY Right    40 years ago  . CHOLECYSTECTOMY  06/2009  . COLONOSCOPY    . ESOPHAGOGASTRODUODENOSCOPY    . EYE SURGERY Bilateral    lasik  . Gastric Wedge resection lipoma  11/2007   x2 with laparotomy and gastrostomy  . JOINT REPLACEMENT    . Left knee replacement    . Rigth knee replacement with revision  04/2008   Dr. Berenice Primas  . ROTATOR CUFF REPAIR Left 01/2009  . s/p bladder surgury  09/2009   Dr. Terance Hart  . SPINAL CORD STIMULATOR INSERTION N/A 09/11/2018   Procedure: LUMBAR SPINAL CORD STIMULATOR INSERTION;  Surgeon: Clydell Hakim, MD;  Location: [facility];  Service: Neurosurgery;  Laterality: N/A;  LUMBAR SPINAL CORD STIMULATOR INSERTION     OB History    Gravida  3   Para  3   Term      Preterm      AB      Living  3     SAB      IAB      Ectopic      Multiple      Live Births              Family History  Problem Relation Age of Onset  . Diabetes Sister        x 3  . Heart disease Sister        x2  . Breast cancer Sister 63  . Diabetes Brother        x3  . Heart disease Brother        x2  . Coronary artery  disease Other        female 1st degree  . Hypertension Other   . Breast cancer Sister 2  . Breast cancer Other 25  . Diabetes Brother   . Colon cancer Neg Hx     Social History   Tobacco Use  . Smoking status: Former Research scientist (life sciences)  . Smokeless tobacco: Never Used  . Tobacco comment: quit 30 years ago  Vaping Use  . Vaping Use: Never used  Substance Use Topics  .  Alcohol use: No    Alcohol/week: 0.0 standard drinks  . Drug use: No    Home Medications Prior to Admission medications   Medication Sig Start Date End Date Taking? Authorizing Provider  aspirin 81 MG EC tablet Take 81 mg by mouth every evening.    [provider]  atorvastatin (LIPITOR) 40 MG tablet TAKE ONE TABLET BY MOUTH EVERY NIGHT AT BEDTIME 06/13/20   Biagio Borg, MD  chlorhexidine (PERIDEX) 0.12 % solution See admin instructions. 02/14/20   [provider]  cloNIDine (CATAPRES) 0.1 MG tablet TAKE ONE TABLET BY MOUTH TWICE A DAY 09/13/20   Elayne Snare, MD  Continuous Blood Gluc Receiver (FREESTYLE LIBRE 2 READER) DEVI Use as directed twice daily E11.9 10/11/20   Biagio Borg, MD  Continuous Blood Gluc Sensor (FREESTYLE LIBRE 2 SENSOR) MISC Use as directed once bi-weekly E11.9 10/11/20   Biagio Borg, MD  ELMIRON 100 MG capsule Take 200 mg by mouth 2 (two) times daily.  07/20/13   [provider]  erythromycin (E-MYCIN) 250 MG tablet Take 250 mg by mouth 4 (four) times daily. 02/14/20   [provider]  FLUoxetine (PROZAC) 20 MG capsule TAKE ONE CAPSULE BY MOUTH DAILY 02/21/20   Biagio Borg, MD  gabapentin (NEURONTIN) 600 MG tablet Take 600 mg by mouth 3 (three) times daily. 01/24/20   [provider]  glucose blood (FREESTYLE TEST STRIPS) test strip Use as instructed to check blood sugar 4 times daily. 04/20/18   Elayne Snare, MD  HYDROcodone-acetaminophen (NORCO/VICODIN) 5-325 MG tablet Take 1 tablet by mouth 4 (four) times daily. 02/09/20   [provider]  insulin glargine  (LANTUS SOLOSTAR) 100 UNIT/ML Solostar Pen Inject 18 Units into the skin daily. Patient taking differently: Inject 20 Units into the skin daily. 03/08/20   Elayne Snare, MD  isosorbide mononitrate (IMDUR) 60 MG 24 hr tablet Take 1 tablet (60 mg total) by mouth daily. 02/21/20   Biagio Borg, MD  levothyroxine (SYNTHROID) 125 MCG tablet Take 1 tablet by mouth daily, Sunday-Friday. On Saturday, take 1.5 tablets. 02/22/20   Elayne Snare, MD  linaclotide Rolan Lipa) 72 MCG capsule Take 1 capsule (72 mcg total) by mouth daily before breakfast. 01/25/19   Ladene Artist, MD  losartan (COZAAR) 100 MG tablet TAKE 1 TABLET BY MOUTH DAILY 05/22/20   Biagio Borg, MD  metoprolol succinate (TOPROL-XL) 50 MG 24 hr tablet TAKE 1 TABLET BY MOUTH DAILY WITH OR IMMEDIATELY FOLLOWING A MEAL 10/02/20   Biagio Borg, MD  nitroGLYCERIN (NITROSTAT) 0.4 MG SL tablet Place 1 tablet (0.4 mg total) under the tongue every 5 (five) minutes as needed for chest pain. 01/27/20   Ghimire, Henreitta Leber, MD  ofloxacin (OCUFLOX) 0.3 % ophthalmic solution  08/04/20   [provider]  ondansetron (ZOFRAN) 4 MG tablet Take 1 tablet (4 mg total) by mouth every 8 (eight) hours as needed for nausea or vomiting. 12/30/19   Biagio Borg, MD  pantoprazole (PROTONIX) 40 MG tablet TAKE 1 TABLET(40 MG) BY MOUTH TWICE DAILY 02/21/20   Biagio Borg, MD  polyethylene glycol (MIRALAX / GLYCOLAX) 17 g packet Take 17 g by mouth daily as needed. 01/27/20   Ghimire, Henreitta Leber, MD  tamsulosin (FLOMAX) 0.4 MG CAPS capsule Take 0.4 mg by mouth daily.  03/20/20   [provider]  traMADol (ULTRAM) 50 MG tablet Take 1 tablet (50 mg total) by mouth every 6 (six) hours as needed. 05/02/19  Scot Jun, FNP    Allergies    Ciprofloxacin, Hydrocodone, and Penicillins  Review of Systems   Review of Systems  Unable to perform ROS: Mental status change    Physical Exam Updated Vital Signs BP (!) 162/91   Pulse 93   Temp (S) (!) 100.7 F  (38.2 C) (Rectal)   Resp 16   Ht '5\' 6"'$  (1.676 m)   Wt 135.6 kg   SpO2 100%   BMI 48.25 kg/m   Physical Exam Vitals and nursing note reviewed.  Constitutional:      Appearance: She is obese.  HENT:     Head: Normocephalic and atraumatic.     Right Ear: External ear normal.     Left Ear: External ear normal.     Nose: Nose normal.     Mouth/Throat:     Mouth: Mucous membranes are dry.  Eyes:     Extraocular Movements: Extraocular movements intact.     Conjunctiva/sclera: Conjunctivae normal.     Pupils: Pupils are equal, round, and reactive to light.  Cardiovascular:     Rate and Rhythm: Normal rate and regular rhythm.     Pulses: Normal pulses.     Heart sounds: Normal heart sounds.  Pulmonary:     Effort: Pulmonary effort is normal.     Breath sounds: Normal breath sounds.  Abdominal:     General: Abdomen is flat. Bowel sounds are normal.     Palpations: Abdomen is soft.  Musculoskeletal:        General: Normal range of motion.     Cervical back: Normal range of motion and neck supple.  Skin:    General: Skin is warm.     Capillary Refill: Capillary refill takes less than 2 seconds.  Neurological:     Mental Status: She is alert.     Comments: Pt will tell me her name.  When I ask her any other question, she answers "Tramaine."  She is following simple commands with both sides of her body.    Psychiatric:     Comments: Unable to assess     ED Results / Procedures / Treatments   Labs (all labs ordered are listed, but only abnormal results are displayed) Labs Reviewed  CBC WITH DIFFERENTIAL/PLATELET - Abnormal; Notable for the following components:      Result Value   RBC 3.73 (*)    Hemoglobin 11.8 (*)    MCV 102.9 (*)    Neutro Abs 7.8 (*)    All other components within normal limits  COMPREHENSIVE METABOLIC PANEL - Abnormal; Notable for the following components:   Sodium 131 (*)    Chloride 96 (*)    CO2 20 (*)    Glucose, Bld 456 (*)    Creatinine, Ser  1.94 (*)    Total Protein 8.4 (*)    Total Bilirubin 1.4 (*)    GFR, Estimated 27 (*)    All other components within normal limits  URINALYSIS, ROUTINE W REFLEX MICROSCOPIC - Abnormal; Notable for the following components:   Color, Urine STRAW (*)    Glucose, UA >=500 (*)    Hgb urine dipstick MODERATE (*)    Ketones, ur 5 (*)    Protein, ur 30 (*)    Bacteria, UA MANY (*)    All other components within normal limits  LACTIC ACID, PLASMA - Abnormal; Notable for the following components:   Lactic Acid, Venous 2.8 (*)    All other components within  normal limits  PROTIME-INR - Abnormal; Notable for the following components:   Prothrombin Time 15.3 (*)    INR 1.3 (*)    All other components within normal limits  TSH - Abnormal; Notable for the following components:   TSH 45.164 (*)    All other components within normal limits  CBG MONITORING, ED - Abnormal; Notable for the following components:   Glucose-Capillary 474 (*)    All other components within normal limits  I-STAT VENOUS BLOOD GAS, ED - Abnormal; Notable for the following components:   pH, Ven 7.456 (*)    pCO2, Ven 31.4 (*)    Sodium 132 (*)    Calcium, Ion 1.01 (*)    All other components within normal limits  SARS CORONAVIRUS 2 BY RT PCR (HOSPITAL ORDER, [facility])  CULTURE, BLOOD (ROUTINE X 2)  CULTURE, BLOOD (ROUTINE X 2)  URINE CULTURE    EKG EKG Interpretation  Date/Time:  Thursday November 09 2020 18:32:23 EST Ventricular Rate:  92 PR Interval:    QRS Duration: 102 QT Interval:  350 QTC Calculation: 433 R Axis:   54 Text Interpretation: Sinus rhythm Probable left atrial enlargement No significant change since last tracing Confirmed by Isla Pence [phone]) on 11/09/2020 6:46:47 PM   Radiology CT ABDOMEN PELVIS WO CONTRAST  Result Date: 11/09/2020 CLINICAL DATA:  Altered mental status and hyperglycemia EXAM: CT ABDOMEN AND PELVIS WITHOUT CONTRAST TECHNIQUE: Multidetector  CT imaging of the abdomen and pelvis was performed following the standard protocol without IV contrast. COMPARISON:  04/05/18 FINDINGS: Lower chest: Lung bases are free of acute infiltrate or sizable effusion. Hepatobiliary: No focal liver abnormality is seen. Status post cholecystectomy. No biliary dilatation. Pancreas: Unremarkable. No pancreatic ductal dilatation or surrounding inflammatory changes. Spleen: Normal in size without focal abnormality. Adrenals/Urinary Tract: Adrenal glands are within normal limits. Kidneys demonstrate no renal calculi or obstructive changes. No definitive mass is seen. The bladder is partially distended. Stomach/Bowel: Scattered diverticular changes noted without evidence of diverticulitis. A left lateral abdominal wall hernia is again identified containing fat although a loop of descending colon now lies within this hernia as well. The appendix is within normal limits. No inflammatory changes are seen. Small bowel is within normal limits. Postsurgical changes in the stomach are noted. Vascular/Lymphatic: Aortic atherosclerosis. No enlarged abdominal or pelvic lymph nodes. Reproductive: Status post hysterectomy. No adnexal masses. Other: Fat containing left inguinal hernia is noted and stable. Previously described left lateral abdominal wall hernia is noted containing a loop of colon although no obstructive changes are seen. Small fat containing supraumbilical hernia is noted as well. No free fluid is noted. Musculoskeletal: Postsurgical changes are seen in the lumbar spine. Spinal stimulator is noted as well. IMPRESSION: Diverticulosis without diverticulitis. Multiple hernias stable in appearance from the prior exam. The left lateral abdominal wall hernia demonstrates a loop of descending colon within although no obstructive changes are noted. Electronically Signed   By: Inez Catalina M.D.   On: 11/09/2020 21:46   CT Head Wo Contrast  Result Date: 11/09/2020 CLINICAL DATA:   Altered mental status.  Confusion. EXAM: CT HEAD WITHOUT CONTRAST TECHNIQUE: Contiguous axial images were obtained from the base of the skull through the vertex without intravenous contrast. COMPARISON:  None. FINDINGS: Brain: No intracranial hemorrhage, mass effect, or midline shift. Brain volume is normal for age. No hydrocephalus. The basilar cisterns are patent. 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 No evidence of territorial infarct or  acute ischemia. No extra-axial or intracranial fluid collection. Vascular: Atherosclerosis of skullbase vasculature without hyperdense vessel or abnormal calcification. Skull: No fracture or focal lesion. Sinuses/Orbits: No acute findings. Left cataract resection. Retained tooth in the left maxillary sinus. Mastoid air cells are clear. Other: None. IMPRESSION: 1. No acute intracranial abnormality. 2. 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 Electronically Signed   By: Keith Rake M.D.   On: 11/09/2020 20:07   DG Chest Portable 1 View  Result Date: 11/09/2020 CLINICAL DATA:  Encephalopathy EXAM: PORTABLE CHEST 1 VIEW COMPARISON:  01/26/2020 FINDINGS: The heart size and mediastinal contours are within normal limits. Both lungs are clear. The visualized skeletal structures are unremarkable. Spinal stimulator leads project over the midthoracic spine. IMPRESSION: No active disease. Electronically Signed   By: Ulyses Jarred M.D.   On: 11/09/2020 19:11    Procedures Procedures   Medications Ordered in ED Medications  acetaminophen (TYLENOL) suppository 650 mg (650 mg Rectal Given 11/09/20 2009)  sodium chloride 0.9 % bolus 1,000 mL (1,000 mLs Intravenous Bolus 11/09/20 2134)  insulin aspart (novoLOG) injection 8 Units (8 Units Intravenous Given 11/09/20 2133)  cefTRIAXone (ROCEPHIN) 1 g in sodium chloride 0.9 % 100 mL IVPB (1 g Intravenous New Bag/Given 11/09/20 2133)    ED Course  I have reviewed the triage vital signs and the nursing  notes.  Pertinent labs & imaging results that were available during my care of the patient were reviewed by me and considered in my medical decision making (see chart for details).    MDM Rules/Calculators/A&P                          Pt's husband said she's been complaining of abdominal pain.  He was also worried she had another uti.  She gets them frequently.  No recent positive urine cultures to look at sensitivity.  Pt with many bacteria in her urine.  Rocephin started.  Pt's fever treated with tylenol.  Pt given IVFs.  Pt's blood sugar is elevated.  Pt's husband said it has wide swings at home.  She is given 8 units novolog in ED.  CT abd/pelvis shows nothing acute.  Covid negative.  MS is improving with treatment, although she is still very slow to respond.  Pt d/w Dr. Posey Pronto (triad) for admission.  CRITICAL CARE Performed by: Isla Pence   Total critical care time: 30 minutes  Critical care time was exclusive of separately billable procedures and treating other patients.  Critical care was necessary to treat or prevent imminent or life-threatening deterioration.  Critical care was time spent personally by me on the following activities: development of treatment plan with patient and/or surrogate as well as nursing, discussions with consultants, evaluation of patient's response to treatment, examination of patient, obtaining history from patient or surrogate, ordering and performing treatments and interventions, ordering and review of laboratory studies, ordering and review of radiographic studies, pulse oximetry and re-evaluation of patient's condition.  [name] was evaluated in Emergency Department on 11/09/2020 for the symptoms described in the history of present illness. She was evaluated in the context of the global COVID-19 pandemic, which necessitated consideration that the patient might be at risk for infection with the SARS-CoV-2 virus that causes COVID-19.  Institutional protocols and algorithms that pertain to the evaluation of patients at risk for COVID-19 are in a state of rapid change based on information released by regulatory bodies including the CDC and  federal and state organizations. These policies and algorithms were followed during the patient's care in the ED.    Final Clinical Impression(s) / ED Diagnoses Final diagnoses:  Metabolic encephalopathy  Poorly controlled type 2 diabetes mellitus ([place])  Acute cystitis without hematuria  Hypothyroidism, unspecified type  CKD (chronic kidney disease) stage 4, GFR 15-29 ml/min [facility])    Rx / DC Orders ED Discharge Orders    None       Isla Pence, MD 11/09/20 2210

## 2020-11-10 ENCOUNTER — Telehealth: Payer: Self-pay | Admitting: Internal Medicine

## 2020-11-10 DIAGNOSIS — E1165 Type 2 diabetes mellitus with hyperglycemia: Secondary | ICD-10-CM

## 2020-11-10 DIAGNOSIS — A419 Sepsis, unspecified organism: Principal | ICD-10-CM

## 2020-11-10 DIAGNOSIS — R6520 Severe sepsis without septic shock: Secondary | ICD-10-CM

## 2020-11-10 LAB — CBG MONITORING, ED
Glucose-Capillary: 180 mg/dL — ABNORMAL HIGH (ref 70–99)
Glucose-Capillary: 184 mg/dL — ABNORMAL HIGH (ref 70–99)
Glucose-Capillary: 206 mg/dL — ABNORMAL HIGH (ref 70–99)
Glucose-Capillary: 178 mg/dL — ABNORMAL HIGH (ref 70–99)

## 2020-11-10 LAB — RAPID URINE DRUG SCREEN, HOSP PERFORMED
Amphetamines: NOT DETECTED
Barbiturates: NOT DETECTED
Benzodiazepines: NOT DETECTED
Cocaine: NOT DETECTED
Opiates: NOT DETECTED
Tetrahydrocannabinol: NOT DETECTED

## 2020-11-10 LAB — CBC
HCT: 35.5 % — ABNORMAL LOW (ref 36.0–46.0)
Hemoglobin: 11.5 g/dL — ABNORMAL LOW (ref 12.0–15.0)
MCH: 33 pg (ref 26.0–34.0)
MCHC: 32.4 g/dL (ref 30.0–36.0)
MCV: 102 fL — ABNORMAL HIGH (ref 80.0–100.0)
Platelets: 179 10*3/uL (ref 150–400)
RBC: 3.48 MIL/uL — ABNORMAL LOW (ref 3.87–5.11)
RDW: 12.7 % (ref 11.5–15.5)
WBC: 6 10*3/uL (ref 4.0–10.5)
nRBC: 0 % (ref 0.0–0.2)

## 2020-11-10 LAB — BASIC METABOLIC PANEL
Anion gap: 12 (ref 5–15)
BUN: 16 mg/dL (ref 8–23)
CO2: 22 mmol/L (ref 22–32)
Calcium: 9.4 mg/dL (ref 8.9–10.3)
Chloride: 103 mmol/L (ref 98–111)
Creatinine, Ser: 1.63 mg/dL — ABNORMAL HIGH (ref 0.44–1.00)
GFR, Estimated: 34 mL/min — ABNORMAL LOW (ref >60)
Glucose, Bld: 199 mg/dL — ABNORMAL HIGH (ref 70–99)
Potassium: 3.8 mmol/L (ref 3.5–5.1)
Sodium: 137 mmol/L (ref 135–145)

## 2020-11-10 LAB — GLUCOSE, CAPILLARY
Glucose-Capillary: 136 mg/dL — ABNORMAL HIGH (ref 70–99)
Glucose-Capillary: 152 mg/dL — ABNORMAL HIGH (ref 70–99)

## 2020-11-10 LAB — LACTIC ACID, PLASMA: Lactic Acid, Venous: 3.5 mmol/L (ref 0.5–1.9)

## 2020-11-10 LAB — PROCALCITONIN: Procalcitonin: 4.46 ng/mL

## 2020-11-10 MED ORDER — LEVOTHYROXINE SODIUM 75 MCG PO TABS
225.0000 ug | ORAL_TABLET | Freq: Every day | ORAL | Status: DC
  Administered 2020-11-11: 225 ug via ORAL
  Filled 2020-11-10: qty 3

## 2020-11-10 MED ORDER — SODIUM CHLORIDE 0.9 % IV SOLN: INTRAVENOUS | Status: DC

## 2020-11-10 NOTE — ED Notes (Signed)
pts position changed for comfort

## 2020-11-10 NOTE — TOC CAGE-AID Note (Signed)
Transition of Care [facility]) - CAGE-AID Screening   Patient Details  Name: [name] MRN: [id number] Date of Birth: [birth year]    Dia Crawford, RN Phone Number: 11/10/2020, 10:00 PM   Clinical Narrative:  Pt unable to participate d/t AMS   CAGE-AID Screening: Substance Abuse Screening unable to be completed due to: : Patient unable to participate

## 2020-11-10 NOTE — ED Notes (Signed)
The pts husband is at  The bedside he is very disgruntled that the pt is still here in the ed and that he does not know the plans for his wife  I called the admitting doctor who reported that he had just spoken to the daughter on the phone and had discussed everything with her  But he also talked to the pts husband.  He wants the pt turned  hiwever I am waiting on somweone to help  She is a very large lady.  The pts husband is unhappy about everything it appears

## 2020-11-10 NOTE — ED Notes (Signed)
Pt crying c/o abd pain

## 2020-11-10 NOTE — Progress Notes (Signed)
PROGRESS NOTE    [name]  [id number] DOB: [birth year] DOA: 11/09/2020 PCP: Biagio Borg, MD    Brief Narrative:  70 year old female with history of type 2 diabetes on insulin, chronic kidney disease stage IV, coronary artery disease, hypertension, hyperlipidemia, hypothyroidism, depression and anxiety, chronic back pain is status post a spinal cord stimulator, recurrent UTI presented from home with altered mental status.  Her husband could not pick her up when she was falling on the ground, her daughters arrived and found confused so called EMS.  Patient also reported recent problem with using insulin and controlling blood sugars. In the emergency room blood pressure is stable.  Temperature 100.3.  98% room air.  Creatinine 1.94.  Baseline 1.6.  Lactic acid 2.8.  Urinalysis consistent with UTI.  COVID-19 negative.  Chest x-ray normal.  CT head normal.  Treated with IV fluid boluses with improvement.  Blood sugars were 456.   Assessment & Plan:   Principal Problem:   Severe sepsis ([place]) Active Problems:   Hypertension associated with diabetes ([place])   Hypothyroidism   Acute kidney injury superimposed on CKD ([facility])   Insulin dependent type 2 diabetes mellitus ([place])   Hyperlipidemia associated with type 2 diabetes mellitus ([place])   Acute metabolic encephalopathy   Acute lower UTI  Severe sepsis present on admission: Suspect secondary to UTI.  Currently hemodynamically stabilizing.  Patient was treated with Rocephin that we will continue until final urine cultures and blood cultures. Continue IV fluids today.  Acute kidney injury super imposed on chronic kidney disease stage IV: Reported baseline creatinine about 1.6-1.7.  Treated with IV fluids with improvement.  Likely prerenal.  Uncontrolled type 2 diabetes: Presented with blood sugars more than 400.  Patient is on insulin at home.  Recently changed doses.  A1c is 8.1.  Hopefully patient is taking insulin as prescribed. Resume  long-acting insulin and keep on sliding scale insulin.  Will uptitrate as it comes and finalized home treatment plan on discharge.  Hypothyroidism: 4.5 on 9/21, 54 on 12/21 45 on 2/22 She has severe symptomatic hypothyroidism.  Not sure what happened last few months her TSH is very high.  Apparently, taking thyroid medicine and recently increased doses as per endocrine notes. Will check T3 and T4.  Will increase dose of thyroxine to 225 mcg daily.  Acute encephalopathy: Probably due to acute UTI.  Also suspect polypharmacy.  Hypothyroidism.  Currently without focal deficit.  Mental status has improved.  Start mobilizing with PT OT.  Hypertension: Blood pressures are stable.  Resume home medications.  Chronic low back pain: Patient has a spinal stimulator in place.  Patient is on oxycodone and tramadol as well as gabapentin at home.  Will resume gabapentin.  Continue low-dose tramadol.  Start mobilizing.   DVT prophylaxis: enoxaparin (LOVENOX) injection 40 mg Start: 11/09/20 2300   Code Status: Full code Family Communication: Patient's daughter Ms. Crystal on the phone Disposition Plan: Status is: Inpatient  Remains inpatient appropriate because:IV treatments appropriate due to intensity of illness or inability to take PO and Inpatient level of care appropriate due to severity of illness   Dispo: The patient is from: Home              Anticipated d/c is to: Home              Anticipated d/c date is: 2 days              Patient currently is not medically stable  to d/c.   Difficult to place patient No         Consultants:   None  Procedures:   None  Antimicrobials:   Rocephin, 2/3----   Subjective: Patient was seen and examined in the morning rounds.  She remains in the ER room.  Patient herself had some back pain otherwise denies any complaints.  Denies any nausea vomiting.  Urinating with no trouble.  Afebrile overnight.  Objective: Vitals:   11/10/20 0600  11/10/20 0800 11/10/20 0922 11/10/20 1115  BP: (!) 149/103 (!) 155/103 (!) 155/103 122/80  Pulse: 81 91  91  Resp: '15 11  16  '$ Temp:      TempSrc:      SpO2: 100% 100%  100%  Weight:      Height:        Intake/Output Summary (Last 24 hours) at 11/10/2020 1406 Last data filed at 11/09/2020 2030 Gross per 24 hour  Intake --  Output 300 ml  Net -300 ml   Filed Weights   11/09/20 1810  Weight: 135.6 kg    Examination:  General exam: Appears calm and comfortable  Patient laying in stretcher, not in any distress.  Looks comfortable. Respiratory system: Clear to auscultation. Respiratory effort normal. Cardiovascular system: S1 & S2 heard, RRR. No JVD, murmurs, rubs, gallops or clicks. No pedal edema. Gastrointestinal system: Abdomen is nondistended, soft and nontender. No organomegaly or masses felt. Normal bowel sounds heard. Central nervous system: Alert and oriented. No focal neurological deficits. Extremities: Symmetric 5 x 5 power. Skin: No rashes, lesions or ulcers Psychiatry: Judgement and insight appear normal. Mood & affect appropriate.     Data Reviewed: I have personally reviewed following labs and imaging studies  CBC: Recent Labs  Lab 11/09/20 1947 11/09/20 1949 11/10/20 0446  WBC 9.1  --  6.0  NEUTROABS 7.8*  --   --   HGB 11.8* 13.3 11.5*  HCT 38.4 39.0 35.5*  MCV 102.9*  --  102.0*  PLT 226  --  0000000   Basic Metabolic Panel: Recent Labs  Lab 11/09/20 1947 11/09/20 1949 11/10/20 0446  NA 131* 132* 137  K 5.1 5.0 3.8  CL 96*  --  103  CO2 20*  --  22  GLUCOSE 456*  --  199*  BUN 21  --  16  CREATININE 1.94*  --  1.63*  CALCIUM 9.9  --  9.4   GFR: Estimated Creatinine Clearance: 45.5 mL/min (A) (by C-G formula based on SCr of 1.63 mg/dL (H)). Liver Function Tests: Recent Labs  Lab 11/09/20 1947  AST 25  ALT 25  ALKPHOS 99  BILITOT 1.4*  PROT 8.4*  ALBUMIN 4.0   No results for input(s): LIPASE, AMYLASE in the last 168 hours. No results  for input(s): AMMONIA in the last 168 hours. Coagulation Profile: Recent Labs  Lab 11/09/20 1947  INR 1.3*   Cardiac Enzymes: No results for input(s): CKTOTAL, CKMB, CKMBINDEX, TROPONINI in the last 168 hours. BNP (last 3 results) No results for input(s): PROBNP in the last 8760 hours. HbA1C: No results for input(s): HGBA1C in the last 72 hours. CBG: Recent Labs  Lab 11/09/20 1810 11/09/20 2323 11/10/20 0430 11/10/20 0807 11/10/20 1159  GLUCAP 474* 294* 180* 184* 206*   Lipid Profile: No results for input(s): CHOL, HDL, LDLCALC, TRIG, CHOLHDL, LDLDIRECT in the last 72 hours. Thyroid Function Tests: Recent Labs    11/09/20 2030  TSH 45.164*   Anemia Panel: No results for input(s):  VITAMINB12, FOLATE, FERRITIN, TIBC, IRON, RETICCTPCT in the last 72 hours. Sepsis Labs: Recent Labs  Lab 11/09/20 1947 11/10/20 0000 11/10/20 0446  PROCALCITON  --   --  4.46  LATICACIDVEN 2.8* 3.5*  --     Recent Results (from the past 240 hour(s))  Culture, blood (routine x 2)     Status: None (Preliminary result)   Collection Time: 11/09/20  6:10 PM   Specimen: BLOOD RIGHT HAND  Result Value Ref Range Status   Specimen Description BLOOD RIGHT HAND  Final   Special Requests   Final    BOTTLES DRAWN AEROBIC AND ANAEROBIC Blood Culture results may not be optimal due to an inadequate volume of blood received in culture bottles   Culture   Final    NO GROWTH < 24 HOURS Performed at [facility], [place] [address], [place], [place] [address]    Report Status PENDING  Incomplete  SARS Coronavirus 2 by RT PCR (hospital order, performed in [facility] hospital lab) Nasopharyngeal Nasopharyngeal Swab     Status: None   Collection Time: 11/09/20  7:43 PM   Specimen: Nasopharyngeal Swab  Result Value Ref Range Status   SARS Coronavirus 2 NEGATIVE NEGATIVE Final    Comment: (NOTE) SARS-CoV-2 target nucleic acids are NOT DETECTED.  The SARS-CoV-2 RNA is generally detectable in upper  and lower respiratory specimens during the acute phase of infection. The lowest concentration of SARS-CoV-2 viral copies this assay can detect is 250 copies / mL. A negative result does not preclude SARS-CoV-2 infection and should not be used as the sole basis for treatment or other patient management decisions.  A negative result may occur with improper specimen collection / handling, submission of specimen other than nasopharyngeal swab, presence of viral mutation(s) within the areas targeted by this assay, and inadequate number of viral copies (<250 copies / mL). A negative result must be combined with clinical observations, patient history, and epidemiological information.  Fact Sheet for Patients:   StrictlyIdeas.no  Fact Sheet for Healthcare Providers: BankingDealers.co.za  This test is not yet approved or  cleared by the [place] FDA and has been authorized for detection and/or diagnosis of SARS-CoV-2 by FDA under an Emergency Use Authorization (EUA).  This EUA will remain in effect (meaning this test can be used) for the duration of the COVID-19 declaration under Section 564(b)(1) of the Act, 21 U.S.C. section 360bbb-3(b)(1), unless the authorization is terminated or revoked sooner.  Performed at [facility], [place] [address] [place], [place] [address]   Culture, blood (routine x 2)     Status: None (Preliminary result)   Collection Time: 11/09/20  8:45 PM   Specimen: BLOOD LEFT HAND  Result Value Ref Range Status   Specimen Description BLOOD LEFT HAND  Final   Special Requests   Final    BOTTLES DRAWN AEROBIC ONLY Blood Culture results may not be optimal due to an inadequate volume of blood received in culture bottles   Culture   Final    NO GROWTH < 24 HOURS Performed at [facility], [place] [address], [place], [place] [address]    Report Status PENDING  Incomplete         Radiology Studies: CT  ABDOMEN PELVIS WO CONTRAST  Result Date: 11/09/2020 CLINICAL DATA:  Altered mental status and hyperglycemia EXAM: CT ABDOMEN AND PELVIS WITHOUT CONTRAST TECHNIQUE: Multidetector CT imaging of the abdomen and pelvis was performed following the standard protocol without IV  contrast. COMPARISON:  04/05/18 FINDINGS: Lower chest: Lung bases are free of acute infiltrate or sizable effusion. Hepatobiliary: No focal liver abnormality is seen. Status post cholecystectomy. No biliary dilatation. Pancreas: Unremarkable. No pancreatic ductal dilatation or surrounding inflammatory changes. Spleen: Normal in size without focal abnormality. Adrenals/Urinary Tract: Adrenal glands are within normal limits. Kidneys demonstrate no renal calculi or obstructive changes. No definitive mass is seen. The bladder is partially distended. Stomach/Bowel: Scattered diverticular changes noted without evidence of diverticulitis. A left lateral abdominal wall hernia is again identified containing fat although a loop of descending colon now lies within this hernia as well. The appendix is within normal limits. No inflammatory changes are seen. Small bowel is within normal limits. Postsurgical changes in the stomach are noted. Vascular/Lymphatic: Aortic atherosclerosis. No enlarged abdominal or pelvic lymph nodes. Reproductive: Status post hysterectomy. No adnexal masses. Other: Fat containing left inguinal hernia is noted and stable. Previously described left lateral abdominal wall hernia is noted containing a loop of colon although no obstructive changes are seen. Small fat containing supraumbilical hernia is noted as well. No free fluid is noted. Musculoskeletal: Postsurgical changes are seen in the lumbar spine. Spinal stimulator is noted as well. IMPRESSION: Diverticulosis without diverticulitis. Multiple hernias stable in appearance from the prior exam. The left lateral abdominal wall hernia demonstrates a loop of descending colon within  although no obstructive changes are noted. Electronically Signed   By: Inez Catalina M.D.   On: 11/09/2020 21:46   CT Head Wo Contrast  Result Date: 11/09/2020 CLINICAL DATA:  Altered mental status.  Confusion. EXAM: CT HEAD WITHOUT CONTRAST TECHNIQUE: Contiguous axial images were obtained from the base of the skull through the vertex without intravenous contrast. COMPARISON:  None. FINDINGS: Brain: No intracranial hemorrhage, mass effect, or midline shift. Brain volume is normal for age. No hydrocephalus. The basilar cisterns are patent. 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 No evidence of territorial infarct or acute ischemia. No extra-axial or intracranial fluid collection. Vascular: Atherosclerosis of skullbase vasculature without hyperdense vessel or abnormal calcification. Skull: No fracture or focal lesion. Sinuses/Orbits: No acute findings. Left cataract resection. Retained tooth in the left maxillary sinus. Mastoid air cells are clear. Other: None. IMPRESSION: 1. No acute intracranial abnormality. 2. 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 Electronically Signed   By: Keith Rake M.D.   On: 11/09/2020 20:07   DG Chest Portable 1 View  Result Date: 11/09/2020 CLINICAL DATA:  Encephalopathy EXAM: PORTABLE CHEST 1 VIEW COMPARISON:  01/26/2020 FINDINGS: The heart size and mediastinal contours are within normal limits. Both lungs are clear. The visualized skeletal structures are unremarkable. Spinal stimulator leads project over the midthoracic spine. IMPRESSION: No active disease. Electronically Signed   By: Ulyses Jarred M.D.   On: 11/09/2020 19:11        Scheduled Meds: . aspirin EC  81 mg Oral Daily  . atorvastatin  40 mg Oral QHS  . cloNIDine  0.1 mg Oral BID  . enoxaparin (LOVENOX) injection  40 mg Subcutaneous QHS  . insulin aspart  0-15 Units Subcutaneous Q4H  . insulin glargine  10 Units Subcutaneous QHS  . isosorbide mononitrate  60 mg Oral Daily   . levothyroxine  225 mcg Oral Q0600  . metoprolol succinate  50 mg Oral Daily   Continuous Infusions: . sodium chloride 100 mL/hr at 11/10/20 1259  . cefTRIAXone (ROCEPHIN)  IV       LOS: 1 day    Time  spent: 35 minutes    Barb Merino, MD [facility] Pager [phone]

## 2020-11-10 NOTE — Telephone Encounter (Signed)
    Patient/ granddaughter  Requesting new Endocrinology referral, declined to see Dr Dwyane Dee

## 2020-11-10 NOTE — ED Notes (Signed)
I attempted to call report was told that the pt was not approved yet  And that it would probably be after shift change

## 2020-11-10 NOTE — ED Notes (Signed)
Report called to rn on 3c

## 2020-11-10 NOTE — Progress Notes (Signed)
Inpatient Diabetes Program Recommendations  AACE/ADA: New Consensus Statement on Inpatient Glycemic Control   Target Ranges:  Prepandial:   less than 140 mg/dL      Peak postprandial:   less than 180 mg/dL (1-2 hours)      Critically ill patients:  140 - 180 mg/dL   Results for [name], [name] (MRN [id number]) as of 11/10/2020 14:53  Ref. Range 11/09/2020 18:10 11/09/2020 23:23 11/10/2020 04:30 11/10/2020 08:07 11/10/2020 11:59  Glucose-Capillary Latest Ref Range: 70 - 99 mg/dL 474 (H)  Novolog 8 units 294 (H)  Novolog 8 units  Lantus 10 units 180 (H)  Novolog 3 units 184 (H)  Novolog 3 units 206 (H)  Novolog 5 units   Review of Glycemic Control  Diabetes history: DM2 Outpatient Diabetes medications: Lantus 10 units daily, Humalog 3-20 units TID with meals Current orders for Inpatient glycemic control: Lantus 10 units QHS, Novolog 0-15 units Q4H  Inpatient Diabetes Program Recommendations:    Insulin: Please consider increasing Lantus to 14 units QHS.  Thanks, Barnie Alderman, RN, MSN, CDE Diabetes Coordinator Inpatient Diabetes Program [phone] (Team Pager from 8am to 5pm)

## 2020-11-10 NOTE — ED Notes (Signed)
[phone] [name] would like an update

## 2020-11-10 NOTE — ED Notes (Signed)
The pt doctor called back he wants to stop the iv fluid for now

## 2020-11-11 LAB — T4, FREE: Free T4: 0.51 ng/dL — ABNORMAL LOW (ref 0.61–1.12)

## 2020-11-11 LAB — GLUCOSE, CAPILLARY
Glucose-Capillary: 225 mg/dL — ABNORMAL HIGH (ref 70–99)
Glucose-Capillary: 156 mg/dL — ABNORMAL HIGH (ref 70–99)
Glucose-Capillary: 201 mg/dL — ABNORMAL HIGH (ref 70–99)
Glucose-Capillary: 252 mg/dL — ABNORMAL HIGH (ref 70–99)

## 2020-11-11 LAB — BASIC METABOLIC PANEL
Anion gap: 12 (ref 5–15)
BUN: 13 mg/dL (ref 8–23)
CO2: 21 mmol/L — ABNORMAL LOW (ref 22–32)
Calcium: 8.9 mg/dL (ref 8.9–10.3)
Chloride: 104 mmol/L (ref 98–111)
Creatinine, Ser: 1.51 mg/dL — ABNORMAL HIGH (ref 0.44–1.00)
GFR, Estimated: 37 mL/min — ABNORMAL LOW (ref >60)
Glucose, Bld: 190 mg/dL — ABNORMAL HIGH (ref 70–99)
Potassium: 3.5 mmol/L (ref 3.5–5.1)
Sodium: 137 mmol/L (ref 135–145)

## 2020-11-11 LAB — CBC WITH DIFFERENTIAL/PLATELET
Abs Immature Granulocytes: 0.01 10*3/uL (ref 0.00–0.07)
Basophils Absolute: 0 10*3/uL (ref 0.0–0.1)
Basophils Relative: 0 %
Eosinophils Absolute: 0.2 10*3/uL (ref 0.0–0.5)
Eosinophils Relative: 3 %
HCT: 28.5 % — ABNORMAL LOW (ref 36.0–46.0)
Hemoglobin: 9.4 g/dL — ABNORMAL LOW (ref 12.0–15.0)
Immature Granulocytes: 0 %
Lymphocytes Relative: 27 %
Lymphs Abs: 1.4 10*3/uL (ref 0.7–4.0)
MCH: 32.8 pg (ref 26.0–34.0)
MCHC: 33 g/dL (ref 30.0–36.0)
MCV: 99.3 fL (ref 80.0–100.0)
Monocytes Absolute: 0.5 10*3/uL (ref 0.1–1.0)
Monocytes Relative: 11 %
Neutro Abs: 3 10*3/uL (ref 1.7–7.7)
Neutrophils Relative %: 59 %
Platelets: 189 10*3/uL (ref 150–400)
RBC: 2.87 MIL/uL — ABNORMAL LOW (ref 3.87–5.11)
RDW: 12.7 % (ref 11.5–15.5)
WBC: 5.1 10*3/uL (ref 4.0–10.5)
nRBC: 0 % (ref 0.0–0.2)

## 2020-11-11 LAB — PHOSPHORUS: Phosphorus: 2.7 mg/dL (ref 2.5–4.6)

## 2020-11-11 LAB — HIV ANTIBODY (ROUTINE TESTING W REFLEX): HIV Screen 4th Generation wRfx: NONREACTIVE

## 2020-11-11 LAB — MAGNESIUM: Magnesium: 1.8 mg/dL (ref 1.7–2.4)

## 2020-11-11 MED ORDER — TRAMADOL HCL 50 MG PO TABS
50.0000 mg | ORAL_TABLET | Freq: Four times a day (QID) | ORAL | Status: DC | PRN
  Administered 2020-11-11: 50 mg via ORAL
  Filled 2020-11-11 (×2): qty 1

## 2020-11-11 MED ORDER — INSULIN GLARGINE 100 UNIT/ML ~~LOC~~ SOLN
14.0000 [IU] | Freq: Every day | SUBCUTANEOUS | Status: DC
  Filled 2020-11-11: qty 0.14

## 2020-11-11 MED ORDER — LEVOTHYROXINE SODIUM 75 MCG PO TABS: 225.0000 ug | ORAL_TABLET | Freq: Every day | ORAL | 0 refills | Status: DC

## 2020-11-11 MED ORDER — CEPHALEXIN 500 MG PO CAPS
500.0000 mg | ORAL_CAPSULE | Freq: Three times a day (TID) | ORAL | 0 refills | Status: AC
Start: 2020-11-12 — End: 2020-11-16

## 2020-11-11 MED ORDER — SODIUM CHLORIDE 0.9 % IV SOLN
1.0000 g | Freq: Once | INTRAVENOUS | Status: AC
  Administered 2020-11-11: 1 g via INTRAVENOUS
  Filled 2020-11-11: qty 1

## 2020-11-11 NOTE — Plan of Care (Signed)

## 2020-11-11 NOTE — TOC Transition Note (Signed)
Transition of Care [facility]) - CM/SW Discharge Note   Patient Details  Name: GENEA MATYAS MRN: [id number] Date of Birth: [birth year]  [facility]) CM/SW Contact:  Carles Collet, RN Phone Number: 11/11/2020, 4:11 PM   Clinical Narrative:   Spoke w patient, who deferred to spouse. Discussed [place] providers, they would like [facility], referral placed and accepted. Spouse understands he will get a call in 1-2 days from [place] to schedule first home visit. Discussed DME needs, they would like a bariatric WC. Order and narrative placed, referral placed to [employer] to deliver to room prior to DC.     Final next level of care: [place] Barriers to Discharge: Barriers Resolved   Patient Goals and CMS Choice Patient states their goals for this hospitalization and ongoing recovery are:: to go home CMS Medicare.gov Compare Post Acute Care list provided to:: Patient Choice offered to / list presented to : [facility]  Discharge Placement                       Discharge Plan and Services                DME Arranged: Wheelchair manual DME Agency: [employer] Date DME Agency Contacted: 11/11/20 Time DME Agency Contacted: [phone] Representative spoke with at DME Agency: [place]: PT,OT,Nurse's Aide [place]: [place] ([place]) Date [place]: 11/11/20 Time [place]: 1611 Representative spoke with at [place]: [place] ([place]) Interventions     Readmission Risk Interventions No flowsheet data found.

## 2020-11-11 NOTE — Discharge Summary (Signed)
Physician Discharge Summary  [name] [id number] DOB: March 12, 1951 DOA: 11/09/2020  PCP: Biagio Borg, MD  Admit date: 11/09/2020 Discharge date: 11/11/2020  Admitted From: Home Disposition: Home with home health  Recommendations for Outpatient Follow-up:  1. Follow up with PCP in 1-2 weeks 2. Please obtain BMP/CBC in one week 3. Follow-up with endocrinology. 4. Check your blood sugars consistently at home and keep a record.  Home Health: PT/OT/home health aide Equipment/Devices: None  Discharge Condition: Stable CODE STATUS: Full code Diet recommendation: Low-salt and low-carb diet  Discharge summary: 70 year old female with history of type 2 diabetes on insulin, chronic kidney disease stage IV, coronary artery disease, hypertension, hyperlipidemia, hypothyroidism, depression and anxiety, chronic back pain status post spinal cord stimulator, recurrent UTI presented from home with altered mental status.  Her husband could not pick her up when she was falling on the ground, her daughters arrived and found confused so called EMS.  Patient also reported recent problem with using insulin and controlling blood sugars. In the emergency room blood pressure stable.  Temperature 100.3.  98% room air.  Creatinine 1.94.  Baseline 1.6.  Lactic acid 2.8.  Urinalysis consistent with UTI.  COVID-19 negative.  Chest x-ray normal.  CT head normal.  Treated with IV fluid boluses with improvement.  Blood sugars were 456. Patient has extensive medical issues, they are described as bullet points below.  1 . severe sepsis present on admission: Resolved. Suspect secondary to UTI.  Currently hemodynamically stabilizing.  Patient was treated with  Blood cultures negative in 48 hours. Urine cultures growing more than 100,000 colonies of E. coli, final cultures pending. Already received 3 days of IV Rocephin with clinical improvement. Will treat with 4 more days of Keflex to complete 7 days of therapy.  Patient does not have any evidence of retention of urine.  She does follow-up with urology. We discussed about avoiding urinary retention, double voiding to prevent retention and patient understands. If urine culture grows otherwise, will call her with different antibiotics.  2. Acute kidney injury super imposed on chronic kidney disease stage IV: Reported baseline creatinine about 1.6-1.7.  Treated with IV fluids with improvement.  Likely prerenal. At baseline now.  3. Uncontrolled type 2 diabetes: Presented with blood sugars more than 400.  Patient is on insulin at home.  Recently changed doses.  A1c is 8.1.  There are conflicting reports of patients insulin. According to the patient and husband, she is taking Lantus as well as insulin 70/30 2 times a day along with sliding scale insulin with carb count. Her blood sugars probably aggravated due to acute infection and dehydration. Suggested her to go back on similar dose of Lantus 10 units once a day, use carb count sliding scale 3 to 20 units. I am not sure she will still need 70/30 insulin when she is using Lantus and prandial insulin. Will defer to outpatient endocrinology, she has a scheduled follow-up with new provider next week. We discussed about dietary compliance with patient and family.  4. Hypothyroidism: Postsurgical. 4.5 on 9/21, 54 on 12/21 45 on 2/22 T4 is 0.51. She has severe hypothyroidism. Not sure what happened last few months her TSH is very high.  Apparently, taking thyroid medicine and recently increased doses as per endocrine notes. Will increase dose of thyroxine to 225 mcg daily on discharge. Please recheck in 3 to 4 weeks.  5. Acute encephalopathy: Probably due to acute UTI.  Also suspect polypharmacy.   Mental status quickly improved.  6. Hypertension: Blood pressures are stable.  Resume home medications.  7. Chronic low back pain: Patient has a spinal stimulator in place.  Patient is on oxycodone and tramadol  as well as gabapentin at home.  Will resume gabapentin.  Continue low-dose tramadol.   Do not use 2 types of opiates. Patient work with PT OT. Significantly debilitated.  Patient is medically stable. She is significantly debilitated. She wants to go home. Offered skilled nursing rehab, however has adequate support at home and wanting to go home today. Discharge on request. Arranged for home health PT OT and home health aide.    Discharge Diagnoses:  Principal Problem:   Severe sepsis ([place]) Active Problems:   Hypertension associated with diabetes ([place])   Hypothyroidism   Acute kidney injury superimposed on CKD ([facility])   Insulin dependent type 2 diabetes mellitus ([place])   Hyperlipidemia associated with type 2 diabetes mellitus ([place])   Acute metabolic encephalopathy   Acute lower UTI    Discharge Instructions  Discharge Instructions    Diet - low sodium heart healthy   Complete by: As directed    Diet Carb Modified   Complete by: As directed    Discharge instructions   Complete by: As directed    You have various insulin regimens in the chart review, Please use  Lantus dose once a day and novolog or humalog with meals. Follow up with Endocrinology for thyroid and diabetes as scheduled .   Increase activity slowly   Complete by: As directed      Allergies as of 11/11/2020      Reactions   Ciprofloxacin Shortness Of Breath, Other (See Comments)   Dizziness   Hydrocodone Shortness Of Breath, Other (See Comments)   Tachycardia   Penicillins Hives   Pt just remembers hives Has patient had a PCN reaction causing immediate rash, facial/tongue/throat swelling, SOB or lightheadedness with hypotension: No Has patient had a PCN reaction causing severe rash involving mucus membranes or skin necrosis: No Has patient had a PCN reaction that required hospitalization No Has patient had a PCN reaction occurring within the last 10 years: No If all of the above answers are "NO", then may  proceed with Cephalosporin use.      Medication List    STOP taking these medications   HYDROcodone-acetaminophen 5-325 MG tablet Commonly known as: NORCO/VICODIN   NovoLOG FlexPen 100 UNIT/ML FlexPen Generic drug: insulin aspart     TAKE these medications   aspirin 81 MG EC tablet Take 81 mg by mouth daily.   atorvastatin 40 MG tablet Commonly known as: LIPITOR TAKE ONE TABLET BY MOUTH EVERY NIGHT AT BEDTIME   cephALEXin 500 MG capsule Commonly known as: KEFLEX Take 1 capsule (500 mg total) by mouth 3 (three) times daily for 4 days. Start taking on: November 12, 2020   cloNIDine 0.1 MG tablet Commonly known as: CATAPRES TAKE ONE TABLET BY MOUTH TWICE A DAY   Elmiron 100 MG capsule Generic drug: pentosan polysulfate Take 200 mg by mouth 2 (two) times daily.   FLUoxetine 20 MG capsule Commonly known as: PROZAC TAKE ONE CAPSULE BY MOUTH DAILY What changed:   how much to take  how to take this  when to take this  additional instructions   FreeStyle Libre 2 [facility] Use as directed twice daily E11.9   FreeStyle Libre 2 Sensor Misc Use as directed once bi-weekly E11.9   gabapentin 600 MG tablet Commonly known as: NEURONTIN Take 600 mg by  mouth See admin instructions. 1 tab in the am and at noon. 2 hs   glucose blood test strip Commonly known as: FREESTYLE TEST STRIPS Use as instructed to check blood sugar 4 times daily.   insulin lispro 100 UNIT/ML injection Commonly known as: HUMALOG Inject 3-20 Units into the skin See admin instructions. Per sliding scale 3 times daily   isosorbide mononitrate 60 MG 24 hr tablet Commonly known as: IMDUR Take 1 tablet (60 mg total) by mouth daily.   Lantus SoloStar 100 UNIT/ML Solostar Pen Generic drug: insulin glargine Inject 18 Units into the skin daily. What changed: how much to take   levothyroxine 75 MCG tablet Commonly known as: SYNTHROID Take 3 tablets (225 mcg total) by mouth daily at 6 (six)  AM. Start taking on: November 12, 2020 What changed:   medication strength  how much to take  how to take this  when to take this  additional instructions   linaclotide 72 MCG capsule Commonly known as: Linzess Take 1 capsule (72 mcg total) by mouth daily before breakfast. What changed:   when to take this  reasons to take this   losartan-hydrochlorothiazide 100-25 MG tablet Commonly known as: HYZAAR Take 1 tablet by mouth daily.   metoprolol succinate 50 MG 24 hr tablet Commonly known as: TOPROL-XL TAKE 1 TABLET BY MOUTH DAILY WITH OR IMMEDIATELY FOLLOWING A MEAL What changed: See the new instructions.   nitroGLYCERIN 0.4 MG SL tablet Commonly known as: NITROSTAT Place 1 tablet (0.4 mg total) under the tongue every 5 (five) minutes as needed for chest pain.   ondansetron 4 MG tablet Commonly known as: Zofran Take 1 tablet (4 mg total) by mouth every 8 (eight) hours as needed for nausea or vomiting.   pantoprazole 40 MG tablet Commonly known as: PROTONIX TAKE 1 TABLET(40 MG) BY MOUTH TWICE DAILY What changed:   how much to take  how to take this  when to take this  reasons to take this  additional instructions   polyethylene glycol 17 g packet Commonly known as: MIRALAX / GLYCOLAX Take 17 g by mouth daily as needed. What changed: reasons to take this   traMADol 50 MG tablet Commonly known as: ULTRAM Take 1 tablet (50 mg total) by mouth every 6 (six) hours as needed. What changed: reasons to take this       Follow-up Information    Biagio Borg, MD Follow up in 2 week(s).   Specialties: Internal Medicine, Radiology Contact information: [place] [address] [phone]              Allergies  Allergen Reactions  . Ciprofloxacin Shortness Of Breath and Other (See Comments)    Dizziness   . Hydrocodone Shortness Of Breath and Other (See Comments)    Tachycardia  . Penicillins Hives    Pt just remembers  hives Has patient had a PCN reaction causing immediate rash, facial/tongue/throat swelling, SOB or lightheadedness with hypotension: No Has patient had a PCN reaction causing severe rash involving mucus membranes or skin necrosis: No Has patient had a PCN reaction that required hospitalization No Has patient had a PCN reaction occurring within the last 10 years: No If all of the above answers are "NO", then may proceed with Cephalosporin use.     Consultations:  None   Procedures/Studies: CT ABDOMEN PELVIS WO CONTRAST  Result Date: 11/09/2020 CLINICAL DATA:  Altered mental status and hyperglycemia EXAM: CT ABDOMEN AND PELVIS WITHOUT CONTRAST  TECHNIQUE: Multidetector CT imaging of the abdomen and pelvis was performed following the standard protocol without IV contrast. COMPARISON:  04/05/18 FINDINGS: Lower chest: Lung bases are free of acute infiltrate or sizable effusion. Hepatobiliary: No focal liver abnormality is seen. Status post cholecystectomy. No biliary dilatation. Pancreas: Unremarkable. No pancreatic ductal dilatation or surrounding inflammatory changes. Spleen: Normal in size without focal abnormality. Adrenals/Urinary Tract: Adrenal glands are within normal limits. Kidneys demonstrate no renal calculi or obstructive changes. No definitive mass is seen. The bladder is partially distended. Stomach/Bowel: Scattered diverticular changes noted without evidence of diverticulitis. A left lateral abdominal wall hernia is again identified containing fat although a loop of descending colon now lies within this hernia as well. The appendix is within normal limits. No inflammatory changes are seen. Small bowel is within normal limits. Postsurgical changes in the stomach are noted. Vascular/Lymphatic: Aortic atherosclerosis. No enlarged abdominal or pelvic lymph nodes. Reproductive: Status post hysterectomy. No adnexal masses. Other: Fat containing left inguinal hernia is noted and stable. Previously  described left lateral abdominal wall hernia is noted containing a loop of colon although no obstructive changes are seen. Small fat containing supraumbilical hernia is noted as well. No free fluid is noted. Musculoskeletal: Postsurgical changes are seen in the lumbar spine. Spinal stimulator is noted as well. IMPRESSION: Diverticulosis without diverticulitis. Multiple hernias stable in appearance from the prior exam. The left lateral abdominal wall hernia demonstrates a loop of descending colon within although no obstructive changes are noted. Electronically Signed   By: Inez Catalina M.D.   On: 11/09/2020 21:46   CT Head Wo Contrast  Result Date: 11/09/2020 CLINICAL DATA:  Altered mental status.  Confusion. EXAM: CT HEAD WITHOUT CONTRAST TECHNIQUE: Contiguous axial images were obtained from the base of the skull through the vertex without intravenous contrast. COMPARISON:  None. FINDINGS: Brain: No intracranial hemorrhage, mass effect, or midline shift. Brain volume is normal for age. No hydrocephalus. The basilar cisterns are patent. 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 No evidence of territorial infarct or acute ischemia. No extra-axial or intracranial fluid collection. Vascular: Atherosclerosis of skullbase vasculature without hyperdense vessel or abnormal calcification. Skull: No fracture or focal lesion. Sinuses/Orbits: No acute findings. Left cataract resection. Retained tooth in the left maxillary sinus. Mastoid air cells are clear. Other: None. IMPRESSION: 1. No acute intracranial abnormality. 2. 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 Electronically Signed   By: Keith Rake M.D.   On: 11/09/2020 20:07   DG Chest Portable 1 View  Result Date: 11/09/2020 CLINICAL DATA:  Encephalopathy EXAM: PORTABLE CHEST 1 VIEW COMPARISON:  01/26/2020 FINDINGS: The heart size and mediastinal contours are within normal limits. Both lungs are clear. The visualized skeletal  structures are unremarkable. Spinal stimulator leads project over the midthoracic spine. IMPRESSION: No active disease. Electronically Signed   By: Ulyses Jarred M.D.   On: 11/09/2020 19:11   (Echo, Carotid, EGD, Colonoscopy, ERCP)    Subjective: Patient seen and examined. Multiple family members on the phone's. Husband at the bedside. Patient herself denies any complaints. She denies any fever chills. Denies any back pain. She wants to go home today and do some therapies at home. Husband wants her to go home.   Discharge Exam: Vitals:   11/11/20 1025 11/11/20 1244  BP: (!) 186/89 (!) 172/83  Pulse: 75 73  Resp: 17 16  Temp: 99.1 F (37.3 C) 98.8 F (37.1 C)  SpO2:  100%   Vitals:  11/10/20 2013 11/10/20 2328 11/11/20 1025 11/11/20 1244  BP: (!) 113/93 (!) 157/76 (!) 186/89 (!) 172/83  Pulse: 75 75 75 73  Resp: '16 18 17 16  '$ Temp: 99.1 F (37.3 C) 98.9 F (37.2 C) 99.1 F (37.3 C) 98.8 F (37.1 C)  TempSrc:  Oral Oral   SpO2: 100% 99%  100%  Weight:      Height:        General: Pt is alert, awake, not in acute distress Fairly comfortable. Conversant. On room air. Cardiovascular: RRR, S1/S2 +, no rubs, no gallops Respiratory: CTA bilaterally, no wheezing, no rhonchi Abdominal: Soft, NT, ND, bowel sounds +, obese and pendulous. No tenderness. Extremities: no edema, no cyanosis    The results of significant diagnostics from this hospitalization (including imaging, microbiology, ancillary and laboratory) are listed below for reference.     Microbiology: Recent Results (from the past 240 hour(s))  Culture, blood (routine x 2)     Status: None (Preliminary result)   Collection Time: 11/09/20  6:10 PM   Specimen: BLOOD RIGHT HAND  Result Value Ref Range Status   Specimen Description BLOOD RIGHT HAND  Final   Special Requests   Final    BOTTLES DRAWN AEROBIC AND ANAEROBIC Blood Culture results may not be optimal due to an inadequate volume of blood received in culture  bottles   Culture   Final    NO GROWTH 2 DAYS Performed at [facility], [place] [address] [place], [place] [address]    Report Status PENDING  Incomplete  SARS Coronavirus 2 by RT PCR (hospital order, performed in [facility] hospital lab) Nasopharyngeal Nasopharyngeal Swab     Status: None   Collection Time: 11/09/20  7:43 PM   Specimen: Nasopharyngeal Swab  Result Value Ref Range Status   SARS Coronavirus 2 NEGATIVE NEGATIVE Final    Comment: (NOTE) SARS-CoV-2 target nucleic acids are NOT DETECTED.  The SARS-CoV-2 RNA is generally detectable in upper and lower respiratory specimens during the acute phase of infection. The lowest concentration of SARS-CoV-2 viral copies this assay can detect is 250 copies / mL. A negative result does not preclude SARS-CoV-2 infection and should not be used as the sole basis for treatment or other patient management decisions.  A negative result may occur with improper specimen collection / handling, submission of specimen other than nasopharyngeal swab, presence of viral mutation(s) within the areas targeted by this assay, and inadequate number of viral copies (<250 copies / mL). A negative result must be combined with clinical observations, patient history, and epidemiological information.  Fact Sheet for Patients:   StrictlyIdeas.no  Fact Sheet for Healthcare Providers: BankingDealers.co.za  This test is not yet approved or  cleared by the [place] FDA and has been authorized for detection and/or diagnosis of SARS-CoV-2 by FDA under an Emergency Use Authorization (EUA).  This EUA will remain in effect (meaning this test can be used) for the duration of the COVID-19 declaration under Section 564(b)(1) of the Act, 21 U.S.C. section 360bbb-3(b)(1), unless the authorization is terminated or revoked sooner.  Performed at [facility], [place] [address], [place], [place] [address]    Urine culture     Status: Abnormal (Preliminary result)   Collection Time: 11/09/20  8:24 PM   Specimen: Urine, Random  Result Value Ref Range Status   Specimen Description URINE, RANDOM  Final   Special Requests NONE  Final   Culture (A)  Final    >=100,000  COLONIES/mL ESCHERICHIA COLI SUSCEPTIBILITIES TO FOLLOW Performed at [place] [address] [place], [place] [address]    Report Status PENDING  Incomplete  Culture, blood (routine x 2)     Status: None (Preliminary result)   Collection Time: 11/09/20  8:45 PM   Specimen: BLOOD LEFT HAND  Result Value Ref Range Status   Specimen Description BLOOD LEFT HAND  Final   Special Requests   Final    BOTTLES DRAWN AEROBIC ONLY Blood Culture results may not be optimal due to an inadequate volume of blood received in culture bottles   Culture   Final    NO GROWTH 2 DAYS Performed at [facility], [place] [address] [place], [place] [address]    Report Status PENDING  Incomplete     Labs: BNP (last 3 results) No results for input(s): BNP in the last 8760 hours. Basic Metabolic Panel: Recent Labs  Lab 11/09/20 1947 11/09/20 1949 11/10/20 0446 11/11/20 0157  NA 131* 132* 137 137  K 5.1 5.0 3.8 3.5  CL 96*  --  103 104  CO2 20*  --  22 21*  GLUCOSE 456*  --  199* 190*  BUN 21  --  16 13  CREATININE 1.94*  --  1.63* 1.51*  CALCIUM 9.9  --  9.4 8.9  MG  --   --   --  1.8  PHOS  --   --   --  2.7   Liver Function Tests: Recent Labs  Lab 11/09/20 1947  AST 25  ALT 25  ALKPHOS 99  BILITOT 1.4*  PROT 8.4*  ALBUMIN 4.0   No results for input(s): LIPASE, AMYLASE in the last 168 hours. No results for input(s): AMMONIA in the last 168 hours. CBC: Recent Labs  Lab 11/09/20 1947 11/09/20 1949 11/10/20 0446 11/11/20 0157  WBC 9.1  --  6.0 5.1  NEUTROABS 7.8*  --   --  3.0  HGB 11.8* 13.3 11.5* 9.4*  HCT 38.4 39.0 35.5* 28.5*  MCV 102.9*  --  102.0* 99.3  PLT 226  --  179 189   Cardiac Enzymes: No  results for input(s): CKTOTAL, CKMB, CKMBINDEX, TROPONINI in the last 168 hours. BNP: Invalid input(s): POCBNP CBG: Recent Labs  Lab 11/10/20 2158 11/10/20 2328 11/11/20 0402 11/11/20 0741 11/11/20 1233  GLUCAP 136* 152* 225* 156* 201*   D-Dimer No results for input(s): DDIMER in the last 72 hours. Hgb A1c No results for input(s): HGBA1C in the last 72 hours. Lipid Profile No results for input(s): CHOL, HDL, LDLCALC, TRIG, CHOLHDL, LDLDIRECT in the last 72 hours. Thyroid function studies Recent Labs    11/09/20 2030  TSH 45.164*   Anemia work up No results for input(s): VITAMINB12, FOLATE, FERRITIN, TIBC, IRON, RETICCTPCT in the last 72 hours. Urinalysis    Component Value Date/Time   COLORURINE STRAW (A) 11/09/2020 2033   APPEARANCEUR CLEAR 11/09/2020 2033   LABSPEC 1.015 11/09/2020 2033   PHURINE 7.0 11/09/2020 2033   GLUCOSEU >=500 (A) 11/09/2020 2033   GLUCOSEU 100 (A) 05/05/2020 1720   HGBUR MODERATE (A) 11/09/2020 2033   BILIRUBINUR NEGATIVE 11/09/2020 2033   BILIRUBINUR negative 04/11/2018 1308   KETONESUR 5 (A) 11/09/2020 2033   PROTEINUR 30 (A) 11/09/2020 2033   UROBILINOGEN 0.2 05/05/2020 1720   NITRITE NEGATIVE 11/09/2020 2033   LEUKOCYTESUR NEGATIVE 11/09/2020 2033   Sepsis Labs Invalid input(s): PROCALCITONIN,  WBC,  LACTICIDVEN Microbiology Recent Results (from the past 240 hour(s))  Culture, blood (routine x 2)     Status: None (Preliminary result)   Collection Time: 11/09/20  6:10 PM   Specimen: BLOOD RIGHT HAND  Result Value Ref Range Status   Specimen Description BLOOD RIGHT HAND  Final   Special Requests   Final    BOTTLES DRAWN AEROBIC AND ANAEROBIC Blood Culture results may not be optimal due to an inadequate volume of blood received in culture bottles   Culture   Final    NO GROWTH 2 DAYS Performed at [facility], [place] [address] [place], [place] [address]    Report Status PENDING  Incomplete  SARS Coronavirus 2 by RT PCR  (hospital order, performed in [facility] hospital lab) Nasopharyngeal Nasopharyngeal Swab     Status: None   Collection Time: 11/09/20  7:43 PM   Specimen: Nasopharyngeal Swab  Result Value Ref Range Status   SARS Coronavirus 2 NEGATIVE NEGATIVE Final    Comment: (NOTE) SARS-CoV-2 target nucleic acids are NOT DETECTED.  The SARS-CoV-2 RNA is generally detectable in upper and lower respiratory specimens during the acute phase of infection. The lowest concentration of SARS-CoV-2 viral copies this assay can detect is 250 copies / mL. A negative result does not preclude SARS-CoV-2 infection and should not be used as the sole basis for treatment or other patient management decisions.  A negative result may occur with improper specimen collection / handling, submission of specimen other than nasopharyngeal swab, presence of viral mutation(s) within the areas targeted by this assay, and inadequate number of viral copies (<250 copies / mL). A negative result must be combined with clinical observations, patient history, and epidemiological information.  Fact Sheet for Patients:   StrictlyIdeas.no  Fact Sheet for Healthcare Providers: BankingDealers.co.za  This test is not yet approved or  cleared by the [place] FDA and has been authorized for detection and/or diagnosis of SARS-CoV-2 by FDA under an Emergency Use Authorization (EUA).  This EUA will remain in effect (meaning this test can be used) for the duration of the COVID-19 declaration under Section 564(b)(1) of the Act, 21 U.S.C. section 360bbb-3(b)(1), unless the authorization is terminated or revoked sooner.  Performed at [facility], [place] [address], [place], [place] [address]   Urine culture     Status: Abnormal (Preliminary result)   Collection Time: 11/09/20  8:24 PM   Specimen: Urine, Random  Result Value Ref Range Status   Specimen Description URINE, RANDOM  Final    Special Requests NONE  Final   Culture (A)  Final    >=100,000 COLONIES/mL ESCHERICHIA COLI SUSCEPTIBILITIES TO FOLLOW Performed at [facility], [place] [address], [place], [place] [address]    Report Status PENDING  Incomplete  Culture, blood (routine x 2)     Status: None (Preliminary result)   Collection Time: 11/09/20  8:45 PM   Specimen: BLOOD LEFT HAND  Result Value Ref Range Status   Specimen Description BLOOD LEFT HAND  Final   Special Requests   Final    BOTTLES DRAWN AEROBIC ONLY Blood Culture results may not be optimal due to an inadequate volume of blood received in culture bottles   Culture   Final    NO GROWTH 2 DAYS Performed at [facility], [place] [address] [place], [place] [address]    Report Status PENDING  Incomplete     Time coordinating discharge:  35 minutes  SIGNED:   Barb Merino, MD  [facility] 11/11/2020,  3:52 PM

## 2020-11-11 NOTE — Evaluation (Signed)
Occupational Therapy Evaluation Patient Details Name: [name] MRN: [id number] DOB: 05/12/1951 Today's Date: 11/11/2020    History of Present Illness [name] is a 70 y.o. female with medical history significant for IDT2DM, CKD stage III-IV, CAD, HTN, HLD, hypothyroidism, depression/anxiety, chronic back pain s/p spinal cord stimulator who presents to the ED for evaluation of altered mental status. Pt slid off of bed at home and family unable to help pt off of ground, so EMS was called.   Clinical Impression   PTA, pt lives with family and typically ambulatory with Rollator (though sometimes does not use AD for mobility). Pt reports husband assists with LB ADLs and safety with shower transfers at home. Pt overall Mod A for bed mobility, Min A x 2 for sit to stand and side steps at bedside using RW. Unable to progress OOB activities due to sudden onset of nausea, dizziness and feeling hot while sitting EOB. (VSS on RA). Pt requires Supervision for UB ADLs and up to Max A for LB ADLs due to deficits noted below. Husband present and reports he feels comfortable being able to assist pt at home with desire to discharge home today. Pt/spouse declines SNF for rehab prior to return home. Plan to progress OOB activities during ADLs within pt's tolerance.     Follow Up Recommendations  Home health OT;Supervision/Assistance - 24 hour (pt/family decline SNF)    Equipment Recommendations  Wheelchair (measurements OT);Wheelchair cushion (measurements OT)    Recommendations for Other Services       Precautions / Restrictions Precautions Precautions: Fall Restrictions Weight Bearing Restrictions: No      Mobility Bed Mobility Overal bed mobility: Needs Assistance Bed Mobility: Supine to Sit;Sit to Supine     Supine to sit: Min assist;+2 for physical assistance;+2 for safety/equipment;HOB elevated Sit to supine: Mod assist;+2 for physical assistance;+2 for safety/equipment   General  bed mobility comments: Min A for advancing trunk to EOB, cues for sequencing. Pt requires increased assist to bring R LE back into bed on return to supine    Transfers Overall transfer level: Needs assistance Equipment used: Rolling walker (2 wheeled) Transfers: Sit to/from Stand Sit to Stand: Min assist;+2 safety/equipment;+2 physical assistance;From elevated surface         General transfer comment: Min A for sit to stand at bedside using RW, cues for side steps along bedside to [facility]. Due to dizziness, sudden onset of feeling hot and nauseous - deferred further mobility    Balance Overall balance assessment: Needs assistance Sitting-balance support: No upper extremity supported;Feet supported Sitting balance-Leahy Scale: Fair     Standing balance support: Bilateral upper extremity supported;During functional activity Standing balance-Leahy Scale: Poor Standing balance comment: reliant on UE support                           ADL either performed or assessed with clinical judgement   ADL Overall ADL's : Needs assistance/impaired Eating/Feeding: Set up;Bed level   Grooming: Set up;Sitting   Upper Body Bathing: Supervision/ safety;Sitting   Lower Body Bathing: Maximal assistance;Sit to/from stand;Sitting/lateral leans   Upper Body Dressing : Supervision/safety;Sitting   Lower Body Dressing: Maximal assistance;Sit to/from stand Lower Body Dressing Details (indicate cue type and reason): Unable to safely reach feet to don socks sitting EOB. Pt reports typically sitting in lower chair for this task at home or husband assists if needed Toilet Transfer: Minimal assistance;+2 for safety/equipment;+2 for physical assistance;RW   Toileting-  Clothing Manipulation and Hygiene: Maximal assistance;Sit to/from stand         General ADL Comments: Pt limited by increasing dizziness once sitting EOB, sweating and increasingly flat affect though BP normal (? blood glucose  levels?). Pt with baseline memory deficits     Vision Patient Visual Report: No change from baseline Vision Assessment?: No apparent visual deficits     Perception     Praxis      Pertinent Vitals/Pain Pain Assessment: Faces Faces Pain Scale: No hurt Pain Intervention(s): Monitored during session;Premedicated before session     Hand Dominance Right   Extremity/Trunk Assessment Upper Extremity Assessment Upper Extremity Assessment: Generalized weakness   Lower Extremity Assessment Lower Extremity Assessment: Defer to PT evaluation   Cervical / Trunk Assessment Cervical / Trunk Assessment: Kyphotic   Communication Communication Communication: No difficulties   Cognition Arousal/Alertness: Awake/alert Behavior During Therapy: Flat affect Overall Cognitive Status: History of cognitive impairments - at baseline Area of Impairment: Orientation;Attention;Memory;Following commands;Safety/judgement;Awareness;Problem solving                 Orientation Level: Disoriented to;Time;Situation (reports 2015 and able to identify season/month when provided with choices) Current Attention Level: Sustained Memory: Decreased short-term memory Following Commands: Follows one step commands with increased time Safety/Judgement: Decreased awareness of deficits Awareness: Emergent Problem Solving: Slow processing;Decreased initiation;Difficulty sequencing;Requires verbal cues;Requires tactile cues General Comments: Pt pleasant and follows directions with increased time. Pt with memory deficits and slower processing speed. benefits from verbal cues for sequencing tasks   General Comments  Assessed BP sitting EOB at 166/86. HR/O2 WFL. Pt husband entering after assisting pt back to bed and reports their desire to return home today. Pt's spouse declines rehab and reports feeling capable of handling pt's needs at home    Exercises     Shoulder Instructions      Home Living  Family/patient expects to be discharged to:: Private residence Living Arrangements: Spouse/significant other;Children Available Help at Discharge: Family;Available 24 hours/day Type of Home: House Home Access: Stairs to enter [employer] of Steps: 3 Entrance Stairs-Rails: Right;Left Home Layout: One level     Bathroom Shower/Tub: Walk-in shower;Tub only   Bathroom Toilet: Handicapped height     Home Equipment: Walker - 2 wheels;Walker - 4 wheels;Bedside commode;Shower seat;Toilet riser;Hand held shower head          Prior Functioning/Environment Level of Independence: Needs assistance  Gait / Transfers Assistance Needed: Pt reports having falls at home though husband denies any falls aside from small one that led to this admission. Pt uses Rollator for mobility, sometimes no AD for mobility ADL's / Homemaking Assistance Needed: Pt husband assists as needed for LB ADLs, and ensuring safety with shower transfers/showering tasks.   Comments: Pt with inconsistent reporting of PLOF, hx of dementia        OT Problem List: Decreased strength;Decreased activity tolerance;Impaired balance (sitting and/or standing);Decreased cognition;Decreased safety awareness;Decreased knowledge of use of DME or AE      OT Treatment/Interventions: Self-care/ADL training;Therapeutic exercise;DME and/or AE instruction;Therapeutic activities;Patient/family education;Balance training    OT Goals(Current goals can be found in the care plan section) Acute Rehab OT Goals Patient Stated Goal: go home today OT Goal Formulation: With patient/family Time For Goal Achievement: 11/25/20 Potential to Achieve Goals: Good ADL Goals Pt Will Perform Grooming: with set-up;standing Pt Will Transfer to Toilet: with supervision;ambulating Pt Will Perform Toileting - Clothing Manipulation and hygiene: with supervision;sitting/lateral leans;sit to/from stand  OT Frequency: Min 2X/week   Barriers  to D/C:             Co-evaluation              AM-PAC OT "6 Clicks" Daily Activity     Outcome Measure Help from another person eating meals?: A Little Help from another person taking care of personal grooming?: A Little Help from another person toileting, which includes using toliet, bedpan, or urinal?: A Lot Help from another person bathing (including washing, rinsing, drying)?: A Lot Help from another person to put on and taking off regular upper body clothing?: A Little Help from another person to put on and taking off regular lower body clothing?: A Lot 6 Click Score: 15   End of Session Equipment Utilized During Treatment: Rolling walker Nurse Communication: Mobility status;Other (comment) (dizziness, nausea, BP reading)  Activity Tolerance: Treatment limited secondary to medical complications (Comment) Patient left: in bed;with call bell/phone within reach;with bed alarm set;with family/visitor present  OT Visit Diagnosis: Unsteadiness on feet (R26.81);Other abnormalities of gait and mobility (R26.89);Muscle weakness (generalized) (M62.81)                Time: [id number] OT Time Calculation (min): 22 min Charges:  OT General Charges $OT Visit: 1 Visit OT Evaluation $OT Eval Moderate Complexity: 1 Mod  Layla Maw, OTR/L  Layla Maw 11/11/2020, 12:57 PM

## 2020-11-11 NOTE — Telephone Encounter (Signed)
Ok referral done 

## 2020-11-11 NOTE — Care Management (Signed)
    Durable Medical Equipment  (From admission, onward)         Start     Ordered   11/11/20 1600  For home use only DME standard manual wheelchair with seat cushion  Once       Comments: Bariatric wheelchair needed due to body habitus.  Patient suffers from weakness which impairs their ability to perform daily activities like walking in the home.  A walker will not resolve issue with performing activities of daily living. A wheelchair will allow patient to safely perform daily activities. Patient can safely propel the wheelchair in the home or has a caregiver who can provide assistance. Length of need lifetime. Accessories: elevating leg rests (ELRs), wheel locks, extensions and anti-tippers.   11/11/20 1601

## 2020-11-11 NOTE — Progress Notes (Signed)
Summary: Pt noted in room, family called by telephone for patient assistance. This nurse went to patient room, educated on call bell-noted sitting on top of patient. Pt encouraged to use, pt stated did not know how. Demonstrated. Pt informed that this nurse would get a pancake call bell for patient. But patient can also call out if like until it comes, that nursing staff is by patient's door. Pt noted rolling eyes.Nurse smiled at patient and asked if she rolled eyes at her, nurse stated that's ok. Pt told that did not want patient to feel like they were alone and that we will be rounding on patient. Pt asked if they had breakfast. Pt replied no. Nurse told patient would assist with meal ordering. Pt states husband bringing breakfast. This nurse stated that's fine, this will be here just in case and it will help to put all patient meals on a bring status so that patient won't miss breakfast, lunch or dinner. Pt husband came into room, very out of breathe and encouraged and standing, this nurse encouraged patient husband to have seat and breathe. Family heard talking on husband phone. Pt family member heard making threats of having someone come to hospital for nurse and accusations of nurse being rude to patient. Nurse informed family that nurse was not being rude to patient, but did have to speak loud due to device. No further comments t/o day from patient family  Exception husband who remained with patient and was pleasant. Discussed with patient and husband blood sugars, nutrition, mobility, assistive devices, medications and more.

## 2020-11-11 NOTE — Evaluation (Signed)
Physical Therapy Evaluation Patient Details Name: [name] MRN: [id number] DOB: 1951/09/17 Today's Date: 11/11/2020   History of Present Illness  [name] is a 70 y.o. female with medical history significant for IDT2DM, CKD stage III-IV, CAD, HTN, HLD, hypothyroidism, depression/anxiety, chronic back pain s/p spinal cord stimulator who presents to the ED for evaluation of altered mental status. Pt slid off of bed at home and family unable to help pt off of ground, so EMS was called.    Clinical Impression  Pt admitted with above diagnosis. PTA pt lived at home with her husband and daughter, mod I mobility with rollator vs no AD. On eval, pt required +2 min/mod assist bed mobility, +2 min assist transfers, and +2 min assist ambulation 3' with RW. +2 utilized for safety due to pt with c/o nausea and feeling hot after sitting up. Her alertness level was waxing and waning with activity. VSS.  Pt currently with functional limitations due to the deficits listed below (see PT Problem List). Pt will benefit from skilled PT to increase their independence and safety with mobility to allow discharge to the venue listed below.  Husband declining SNF. Reports family is able to provide needed level of assist at home. Recommending wheelchair but husband unsure if he wants it. He did report they sometimes use the rollator as a wheelchair. Educated that is was not safe. The rollator is only intended to provide a stationary seat.     Follow Up Recommendations Home health PT;Supervision/Assistance - 24 hour    Equipment Recommendations  Wheelchair cushion (measurements PT);Wheelchair (measurements PT)    Recommendations for Other Services       Precautions / Restrictions Precautions Precautions: Fall Restrictions Weight Bearing Restrictions: No      Mobility  Bed Mobility Overal bed mobility: Needs Assistance Bed Mobility: Supine to Sit;Sit to Supine     Supine to sit: Min assist;+2 for  physical assistance;+2 for safety/equipment;HOB elevated Sit to supine: Mod assist;+2 for physical assistance;+2 for safety/equipment   General bed mobility comments: Min A for advancing trunk to EOB, cues for sequencing. Pt requires increased assist to bring R LE back into bed on return to supine    Transfers Overall transfer level: Needs assistance Equipment used: Rolling walker (2 wheeled) Transfers: Sit to/from Stand Sit to Stand: Min assist;+2 safety/equipment;+2 physical assistance;From elevated surface         General transfer comment: Min A for sit to stand at bedside using RW, cues for side steps along bedside to [facility]. Due to dizziness, sudden onset of feeling hot and nauseous - deferred further mobility  Ambulation/Gait Ambulation/Gait assistance: +2 safety/equipment;+2 physical assistance;Min assist Gait Distance (Feet): 3 Feet Assistive device: Rolling walker (2 wheeled) Gait Pattern/deviations: Step-to pattern     General Gait Details: sidestepping bedside, cues for sequencing, assist to maintain balance and manage RW  Stairs            Wheelchair Mobility    Modified Rankin (Stroke Patients Only)       Balance Overall balance assessment: Needs assistance Sitting-balance support: No upper extremity supported;Feet supported Sitting balance-Leahy Scale: Fair     Standing balance support: Bilateral upper extremity supported;During functional activity Standing balance-Leahy Scale: Poor Standing balance comment: reliant on UE support                             Pertinent Vitals/Pain Pain Assessment: Faces Faces Pain Scale: No hurt Pain  Intervention(s): Monitored during session;Premedicated before session    Home Living Family/patient expects to be discharged to:: Private residence Living Arrangements: Spouse/significant other;Children Available Help at Discharge: Family;Available 24 hours/day Type of Home: House Home Access: Stairs to  enter Entrance Stairs-Rails: Psychiatric nurse of Steps: 3 Home Layout: One level Home Equipment: Walker - 2 wheels;Walker - 4 wheels;Bedside commode;Shower seat;Toilet riser;Hand held shower head      Prior Function Level of Independence: Needs assistance   Gait / Transfers Assistance Needed: Pt reports having falls at home though husband denies any falls aside from small one that led to this admission. Pt uses Rollator for mobility, sometimes no AD for mobility  ADL's / Homemaking Assistance Needed: Pt husband assists as needed for LB ADLs, and ensuring safety with shower transfers/showering tasks.  Comments: Pt with inconsistent reporting of PLOF, hx of dementia     Hand Dominance   Dominant Hand: Right    Extremity/Trunk Assessment   Upper Extremity Assessment Upper Extremity Assessment: Generalized weakness    Lower Extremity Assessment Lower Extremity Assessment: Generalized weakness    Cervical / Trunk Assessment Cervical / Trunk Assessment: Kyphotic;Other exceptions Cervical / Trunk Exceptions: h/o lumbar fusion  Communication   Communication: No difficulties  Cognition Arousal/Alertness: Awake/alert Behavior During Therapy: Flat affect Overall Cognitive Status: History of cognitive impairments - at baseline Area of Impairment: Orientation;Attention;Memory;Following commands;Safety/judgement;Awareness;Problem solving                 Orientation Level: Disoriented to;Time;Situation Current Attention Level: Sustained Memory: Decreased short-term memory Following Commands: Follows one step commands with increased time Safety/Judgement: Decreased awareness of deficits;Decreased awareness of safety Awareness: Emergent Problem Solving: Slow processing;Decreased initiation;Difficulty sequencing;Requires verbal cues;Requires tactile cues General Comments: Pt pleasant and follows directions with increased time. Pt with memory deficits and slower  processing speed. benefits from verbal cues for sequencing tasks      General Comments General comments (skin integrity, edema, etc.): Upon sitting EOB, pt with c/o nausea and feeling hot. BP 162/88. HR/O2 WFL. Pt's husband entered room at end of session. He reports their desire to return home today. Pt's spouse declines rehab and reports feeling capable of handling pt's needs at home with home health services.    Exercises     Assessment/Plan    PT Assessment Patient needs continued PT services  PT Problem List Decreased strength;Decreased mobility;Decreased safety awareness;Decreased activity tolerance;Decreased balance;Decreased cognition       PT Treatment Interventions DME instruction;Therapeutic activities;Gait training;Therapeutic exercise;Patient/family education;Stair training;Balance training;Functional mobility training    PT Goals (Current goals can be found in the Care Plan section)  Acute Rehab PT Goals Patient Stated Goal: go home today PT Goal Formulation: With patient/family Time For Goal Achievement: 11/25/20 Potential to Achieve Goals: Good    Frequency Min 3X/week   Barriers to discharge        Co-evaluation               AM-PAC PT "6 Clicks" Mobility  Outcome Measure Help needed turning from your back to your side while in a flat bed without using bedrails?: A Little Help needed moving from lying on your back to sitting on the side of a flat bed without using bedrails?: A Lot Help needed moving to and from a bed to a chair (including a wheelchair)?: A Little Help needed standing up from a chair using your arms (e.g., wheelchair or bedside chair)?: A Little Help needed to walk in hospital room?: A Lot Help needed climbing  3-5 steps with a railing? : A Lot 6 Click Score: 15    End of Session Equipment Utilized During Treatment: Gait belt Activity Tolerance: Treatment limited secondary to medical complications (Comment) (nausea) Patient left: in  bed;with call bell/phone within reach;with family/visitor present Nurse Communication: Mobility status PT Visit Diagnosis: Difficulty in walking, not elsewhere classified (R26.2);Muscle weakness (generalized) (M62.81)    Time: [id number] PT Time Calculation (min) (ACUTE ONLY): 34 min   Charges:   PT Evaluation $PT Eval Moderate Complexity: 1 Mod          Lorrin Goodell, PT  Office # [phone] Pager [phone]   Lorriane Shire 11/11/2020, 1:20 PM

## 2020-11-11 NOTE — Progress Notes (Signed)
OT Cancellation Note  Patient Details Name: [name] MRN: [id number] DOB: 1951/04/29   Cancelled Treatment:    Reason Eval/Treat Not Completed: Other (comment) Per RN, awaiting tramadol order completion and hopes for administration prior to OOB attempts. Pt's spouse at bedside would like for pt to be able to eat her breakfast prior to therapy attempts. Will follow-up for OT eval as time allows.   Layla Maw 11/11/2020, 11:37 AM

## 2020-11-12 LAB — T3, FREE: T3, Free: 1 pg/mL — ABNORMAL LOW (ref 2.0–4.4)

## 2020-11-12 LAB — URINE CULTURE: Culture: >=100000 — AB

## 2020-11-13 ENCOUNTER — Telehealth: Payer: Self-pay

## 2020-11-13 ENCOUNTER — Telehealth: Payer: Self-pay | Admitting: Internal Medicine

## 2020-11-13 DIAGNOSIS — F32A Depression, unspecified: Secondary | ICD-10-CM | POA: Diagnosis not present

## 2020-11-13 DIAGNOSIS — E1143 Type 2 diabetes mellitus with diabetic autonomic (poly)neuropathy: Secondary | ICD-10-CM | POA: Diagnosis not present

## 2020-11-13 DIAGNOSIS — D509 Iron deficiency anemia, unspecified: Secondary | ICD-10-CM | POA: Diagnosis not present

## 2020-11-13 DIAGNOSIS — M549 Dorsalgia, unspecified: Secondary | ICD-10-CM | POA: Diagnosis not present

## 2020-11-13 DIAGNOSIS — E1165 Type 2 diabetes mellitus with hyperglycemia: Secondary | ICD-10-CM | POA: Diagnosis not present

## 2020-11-13 DIAGNOSIS — E785 Hyperlipidemia, unspecified: Secondary | ICD-10-CM | POA: Diagnosis not present

## 2020-11-13 DIAGNOSIS — E890 Postprocedural hypothyroidism: Secondary | ICD-10-CM | POA: Diagnosis not present

## 2020-11-13 DIAGNOSIS — G8929 Other chronic pain: Secondary | ICD-10-CM | POA: Diagnosis not present

## 2020-11-13 DIAGNOSIS — D759 Disease of blood and blood-forming organs, unspecified: Secondary | ICD-10-CM | POA: Diagnosis not present

## 2020-11-13 DIAGNOSIS — N184 Chronic kidney disease, stage 4 (severe): Secondary | ICD-10-CM | POA: Diagnosis not present

## 2020-11-13 DIAGNOSIS — E1122 Type 2 diabetes mellitus with diabetic chronic kidney disease: Secondary | ICD-10-CM | POA: Diagnosis not present

## 2020-11-13 DIAGNOSIS — G4700 Insomnia, unspecified: Secondary | ICD-10-CM | POA: Diagnosis not present

## 2020-11-13 DIAGNOSIS — M5412 Radiculopathy, cervical region: Secondary | ICD-10-CM | POA: Diagnosis not present

## 2020-11-13 DIAGNOSIS — M1990 Unspecified osteoarthritis, unspecified site: Secondary | ICD-10-CM | POA: Diagnosis not present

## 2020-11-13 DIAGNOSIS — K449 Diaphragmatic hernia without obstruction or gangrene: Secondary | ICD-10-CM | POA: Diagnosis not present

## 2020-11-13 DIAGNOSIS — K259 Gastric ulcer, unspecified as acute or chronic, without hemorrhage or perforation: Secondary | ICD-10-CM | POA: Diagnosis not present

## 2020-11-13 DIAGNOSIS — I2510 Atherosclerotic heart disease of native coronary artery without angina pectoris: Secondary | ICD-10-CM | POA: Diagnosis not present

## 2020-11-13 DIAGNOSIS — E1169 Type 2 diabetes mellitus with other specified complication: Secondary | ICD-10-CM | POA: Diagnosis not present

## 2020-11-13 DIAGNOSIS — F419 Anxiety disorder, unspecified: Secondary | ICD-10-CM | POA: Diagnosis not present

## 2020-11-13 DIAGNOSIS — G43909 Migraine, unspecified, not intractable, without status migrainosus: Secondary | ICD-10-CM | POA: Diagnosis not present

## 2020-11-13 DIAGNOSIS — K5730 Diverticulosis of large intestine without perforation or abscess without bleeding: Secondary | ICD-10-CM | POA: Diagnosis not present

## 2020-11-13 DIAGNOSIS — N390 Urinary tract infection, site not specified: Secondary | ICD-10-CM | POA: Diagnosis not present

## 2020-11-13 DIAGNOSIS — I129 Hypertensive chronic kidney disease with stage 1 through stage 4 chronic kidney disease, or unspecified chronic kidney disease: Secondary | ICD-10-CM | POA: Diagnosis not present

## 2020-11-13 DIAGNOSIS — G9341 Metabolic encephalopathy: Secondary | ICD-10-CM | POA: Diagnosis not present

## 2020-11-13 DIAGNOSIS — K219 Gastro-esophageal reflux disease without esophagitis: Secondary | ICD-10-CM | POA: Diagnosis not present

## 2020-11-13 MED ORDER — LINACLOTIDE 72 MCG PO CAPS: 72.0000 ug | ORAL_CAPSULE | Freq: Every day | ORAL | 11 refills | Status: AC

## 2020-11-13 MED ORDER — PANTOPRAZOLE SODIUM 40 MG PO TBEC: DELAYED_RELEASE_TABLET | ORAL | 3 refills | Status: AC

## 2020-11-13 MED ORDER — FLUOXETINE HCL 20 MG PO CAPS: ORAL_CAPSULE | ORAL | 3 refills | Status: AC

## 2020-11-13 NOTE — Telephone Encounter (Signed)
meds refilled  Ok for verbals

## 2020-11-13 NOTE — Telephone Encounter (Signed)
Transition Care Management Follow-up Telephone Call  Date of discharge and from where: 11/11/2020 from [facility]  How have you been since you were released from the hospital? Weak, dizzy and vomiting  Any questions or concerns? No  Items Reviewed:  Did the pt receive and understand the discharge instructions provided? Yes   Medications obtained and verified? Yes   Other? No   Any new allergies since your discharge? No   Dietary orders reviewed? Yes Low sodium heart healthy diet  Do you have support at home? Yes  husband  Home Care and Equipment/Supplies: Were home health services ordered? yes If so, what is the name of the agency? [facility]  Has the agency set up a time to come to the patient's home? Yes 11/13/2020 Were any new equipment or medical supplies ordered?  Yes: wheelchair What is the name of the medical supply agency? [facility] Were you able to get the supplies/equipment? yes Do you have any questions related to the use of the equipment or supplies? No  Functional Questionnaire: (I = Independent and D = Dependent) ADLs: D  Bathing/Dressing- D  Meal Prep- D  Eating- D  Maintaining continence- D  Transferring/Ambulation- D  Managing Meds- D  Follow up appointments reviewed:   PCP Hospital f/u appt confirmed? Yes  Scheduled to see Cathlean Cower, MD. on 11/24/2020 @ 3:40 pm.  [facility] f/u appt confirmed? No    Are transportation arrangements needed? No   If their condition worsens, is the pt aware to call PCP or go to the Emergency Dept.? Yes  Was the patient provided with contact information for the PCP's office or ED? Yes  Was to pt encouraged to call back with questions or concerns? Yes

## 2020-11-13 NOTE — Telephone Encounter (Signed)
    Kelly from [facility] calling to report while seeing the patient today her blood sugar was 425. The spouse did not know directions for giving sliding scale insulin  Requesting verbal order for Nurse, PT 1W8.  Also requesting verbal order for Purewick system to use at night  Claiborne Billings also reports patient did not have:linaclotide (LINZESS) 72 MCG capsule, FLUoxetine (PROZAC) 20 MG capsule and pantoprazole (PROTONIX) 40 MG tablet   Please call Kelly at [phone]

## 2020-11-14 ENCOUNTER — Telehealth: Payer: Self-pay | Admitting: Endocrinology

## 2020-11-14 LAB — CULTURE, BLOOD (ROUTINE X 2)
Culture: NO GROWTH
Culture: NO GROWTH

## 2020-11-14 NOTE — Telephone Encounter (Signed)
Patient called was recently released from hospital and wants to know what medications she should be on, what dosages she should be taking, and when to take because the hospital doctor gave her different medicines, etc.  Call back number is [phone]

## 2020-11-14 NOTE — Telephone Encounter (Signed)
Noted, patient is aware as is her husband.   She asked for something because she was so weak and I instructed her to call her PCP for that.

## 2020-11-14 NOTE — Telephone Encounter (Signed)
She will take Lantus 10 units daily along with the 70/30 insulin: 8 Units on waking up and 12 units before pm meal daily. On her levothyroxine she will take 1/2 tablets of the 125 mcg daily Other medications to be reviewed by Dr. Jenny Reichmann She can be seen in the office in about a week for follow-up, likely needs to be seen by Dr. Jenny Reichmann next week

## 2020-11-14 NOTE — Telephone Encounter (Signed)
Ok to continue meds as prescribed and f/u at [facility] as planned

## 2020-11-14 NOTE — Telephone Encounter (Signed)
Verbals left on voicemail. 

## 2020-11-14 NOTE — Telephone Encounter (Signed)
Please see below.

## 2020-11-15 DIAGNOSIS — H43813 Vitreous degeneration, bilateral: Secondary | ICD-10-CM | POA: Diagnosis not present

## 2020-11-15 DIAGNOSIS — H2511 Age-related nuclear cataract, right eye: Secondary | ICD-10-CM | POA: Diagnosis not present

## 2020-11-15 NOTE — Telephone Encounter (Signed)
Pt voicemail full please her know Dr Jenny Reichmann last note to cont med

## 2020-11-20 ENCOUNTER — Telehealth: Payer: Self-pay | Admitting: Internal Medicine

## 2020-11-20 NOTE — Telephone Encounter (Signed)
[facility]   Caller states, mom has issues with her left leg. Was released from hospital last Saturday for sepsis from UTI. Cannot move her left leg as much. Numbness in some areas from her last two toes up. Left leg and feels "empty." Leg is not swollen.  Advised to see PCP within 4 Hours  According to the patient the numbness has improved. Patient declined an appointment for 2.14.22 and preferred to keep her appointment on 2.18.22.

## 2020-11-21 ENCOUNTER — Telehealth: Payer: Self-pay | Admitting: Internal Medicine

## 2020-11-21 DIAGNOSIS — E1165 Type 2 diabetes mellitus with hyperglycemia: Secondary | ICD-10-CM | POA: Diagnosis not present

## 2020-11-21 DIAGNOSIS — E1169 Type 2 diabetes mellitus with other specified complication: Secondary | ICD-10-CM | POA: Diagnosis not present

## 2020-11-21 DIAGNOSIS — E1122 Type 2 diabetes mellitus with diabetic chronic kidney disease: Secondary | ICD-10-CM | POA: Diagnosis not present

## 2020-11-21 DIAGNOSIS — N184 Chronic kidney disease, stage 4 (severe): Secondary | ICD-10-CM | POA: Diagnosis not present

## 2020-11-21 DIAGNOSIS — G9341 Metabolic encephalopathy: Secondary | ICD-10-CM | POA: Diagnosis not present

## 2020-11-21 DIAGNOSIS — I129 Hypertensive chronic kidney disease with stage 1 through stage 4 chronic kidney disease, or unspecified chronic kidney disease: Secondary | ICD-10-CM | POA: Diagnosis not present

## 2020-11-21 NOTE — Telephone Encounter (Signed)
[facility] called and was wondering if 3 pages were received for a prescription for diabetic shoes. Please advise

## 2020-11-23 ENCOUNTER — Telehealth: Payer: Self-pay | Admitting: Internal Medicine

## 2020-11-23 DIAGNOSIS — G9341 Metabolic encephalopathy: Secondary | ICD-10-CM | POA: Diagnosis not present

## 2020-11-23 DIAGNOSIS — I129 Hypertensive chronic kidney disease with stage 1 through stage 4 chronic kidney disease, or unspecified chronic kidney disease: Secondary | ICD-10-CM | POA: Diagnosis not present

## 2020-11-23 DIAGNOSIS — E1169 Type 2 diabetes mellitus with other specified complication: Secondary | ICD-10-CM | POA: Diagnosis not present

## 2020-11-23 DIAGNOSIS — E1122 Type 2 diabetes mellitus with diabetic chronic kidney disease: Secondary | ICD-10-CM | POA: Diagnosis not present

## 2020-11-23 DIAGNOSIS — E1165 Type 2 diabetes mellitus with hyperglycemia: Secondary | ICD-10-CM | POA: Diagnosis not present

## 2020-11-23 DIAGNOSIS — N184 Chronic kidney disease, stage 4 (severe): Secondary | ICD-10-CM | POA: Diagnosis not present

## 2020-11-23 NOTE — Telephone Encounter (Signed)
Ok for verbals 

## 2020-11-23 NOTE — Telephone Encounter (Signed)
Katie a Marine scientist with [facility] calling, they saw the patient today and they are requesting verbal orders for nursing for one time a week for eight weeks.  Also wanted to let us know how the patient is taking her insulin because it differs from what the discharge paper work says, family stated Dr. Dwyane Dee called them and told her to take it this way. 10 units of lantus in the morning 18 units of 70/30 twice a day 8 units of humalog with meals   Katie- [phone] Okay to lvm

## 2020-11-24 ENCOUNTER — Other Ambulatory Visit: Payer: Self-pay

## 2020-11-24 ENCOUNTER — Ambulatory Visit (INDEPENDENT_AMBULATORY_CARE_PROVIDER_SITE_OTHER): Payer: Medicare Other | Admitting: Internal Medicine

## 2020-11-24 ENCOUNTER — Encounter: Payer: Self-pay | Admitting: Internal Medicine

## 2020-11-24 VITALS — BP 132/80 | HR 61 | Temp 98.1°F | Ht 66.0 in | Wt 306.0 lb

## 2020-11-24 DIAGNOSIS — G8929 Other chronic pain: Secondary | ICD-10-CM | POA: Diagnosis not present

## 2020-11-24 DIAGNOSIS — E785 Hyperlipidemia, unspecified: Secondary | ICD-10-CM | POA: Diagnosis not present

## 2020-11-24 DIAGNOSIS — G9341 Metabolic encephalopathy: Secondary | ICD-10-CM | POA: Diagnosis not present

## 2020-11-24 DIAGNOSIS — M5412 Radiculopathy, cervical region: Secondary | ICD-10-CM | POA: Diagnosis not present

## 2020-11-24 DIAGNOSIS — K5730 Diverticulosis of large intestine without perforation or abscess without bleeding: Secondary | ICD-10-CM

## 2020-11-24 DIAGNOSIS — N390 Urinary tract infection, site not specified: Secondary | ICD-10-CM

## 2020-11-24 DIAGNOSIS — F419 Anxiety disorder, unspecified: Secondary | ICD-10-CM

## 2020-11-24 DIAGNOSIS — K259 Gastric ulcer, unspecified as acute or chronic, without hemorrhage or perforation: Secondary | ICD-10-CM

## 2020-11-24 DIAGNOSIS — E1169 Type 2 diabetes mellitus with other specified complication: Secondary | ICD-10-CM | POA: Diagnosis not present

## 2020-11-24 DIAGNOSIS — E1165 Type 2 diabetes mellitus with hyperglycemia: Secondary | ICD-10-CM | POA: Diagnosis not present

## 2020-11-24 DIAGNOSIS — E890 Postprocedural hypothyroidism: Secondary | ICD-10-CM

## 2020-11-24 DIAGNOSIS — I2510 Atherosclerotic heart disease of native coronary artery without angina pectoris: Secondary | ICD-10-CM | POA: Diagnosis not present

## 2020-11-24 DIAGNOSIS — F32A Depression, unspecified: Secondary | ICD-10-CM

## 2020-11-24 DIAGNOSIS — M1990 Unspecified osteoarthritis, unspecified site: Secondary | ICD-10-CM | POA: Diagnosis not present

## 2020-11-24 DIAGNOSIS — E039 Hypothyroidism, unspecified: Secondary | ICD-10-CM | POA: Insufficient documentation

## 2020-11-24 DIAGNOSIS — M549 Dorsalgia, unspecified: Secondary | ICD-10-CM

## 2020-11-24 DIAGNOSIS — E119 Type 2 diabetes mellitus without complications: Secondary | ICD-10-CM | POA: Diagnosis not present

## 2020-11-24 DIAGNOSIS — K219 Gastro-esophageal reflux disease without esophagitis: Secondary | ICD-10-CM

## 2020-11-24 DIAGNOSIS — D509 Iron deficiency anemia, unspecified: Secondary | ICD-10-CM

## 2020-11-24 DIAGNOSIS — G4700 Insomnia, unspecified: Secondary | ICD-10-CM

## 2020-11-24 DIAGNOSIS — N184 Chronic kidney disease, stage 4 (severe): Secondary | ICD-10-CM

## 2020-11-24 DIAGNOSIS — D759 Disease of blood and blood-forming organs, unspecified: Secondary | ICD-10-CM

## 2020-11-24 DIAGNOSIS — E1122 Type 2 diabetes mellitus with diabetic chronic kidney disease: Secondary | ICD-10-CM | POA: Diagnosis not present

## 2020-11-24 DIAGNOSIS — I129 Hypertensive chronic kidney disease with stage 1 through stage 4 chronic kidney disease, or unspecified chronic kidney disease: Secondary | ICD-10-CM | POA: Diagnosis not present

## 2020-11-24 DIAGNOSIS — Z794 Long term (current) use of insulin: Secondary | ICD-10-CM | POA: Diagnosis not present

## 2020-11-24 DIAGNOSIS — E1143 Type 2 diabetes mellitus with diabetic autonomic (poly)neuropathy: Secondary | ICD-10-CM | POA: Diagnosis not present

## 2020-11-24 DIAGNOSIS — K449 Diaphragmatic hernia without obstruction or gangrene: Secondary | ICD-10-CM

## 2020-11-24 DIAGNOSIS — G43909 Migraine, unspecified, not intractable, without status migrainosus: Secondary | ICD-10-CM

## 2020-11-24 MED ORDER — TRAMADOL HCL 50 MG PO TABS: 50.0000 mg | ORAL_TABLET | Freq: Four times a day (QID) | ORAL | 0 refills | Status: DC | PRN

## 2020-11-24 NOTE — Assessment & Plan Note (Signed)
Lab Results  Component Value Date   CREATININE 1.51 (H) 11/11/2020   Stable overall, cont to avoid nephrotoxins

## 2020-11-24 NOTE — Assessment & Plan Note (Signed)
For tramadol refill, cont PT at home as arranged

## 2020-11-24 NOTE — Telephone Encounter (Signed)
Verbals given to [employer]

## 2020-11-24 NOTE — Patient Instructions (Signed)
Ok to take the flomax you have at home for at least the next week  Ok to continue the lantus at 10 units per day, but you need to change the 70/30 from the 18 units twice per day to the 8 units in the AM, and 12 units in the PM  Please continue all other medications as before, and refills have been done if requested - tramadol  Please have the pharmacy call with any other refills you may need.  Please keep your appointments with your specialists as you may have planned  Please go to the LAB at the blood drawing area for the tests to be done  Remember, the thyroid testing is not helpful to again until about 4 wks after any dose change  You will be contacted by phone if any changes need to be made immediately.  Otherwise, you will receive a letter about your results with an explanation, but please check with MyChart first.  Please remember to sign up for MyChart if you have not done so, as this will be important to you in the future with finding out test results, communicating by private email, and scheduling acute appointments online when needed.  Please make an Appointment to return in 4 weeks

## 2020-11-24 NOTE — Progress Notes (Signed)
Patient ID: [name], female   DOB: [birth year], 70 y.o.   MRN: [id number]        Chief Complaint: f/u recent hospn       HPI:  [name] is a 70 y.o. female here with c/o f/u recent hospn 2/3 to 2/5 with severe sepsis, UTI, and TME in the setting of morbid obesity, DM, CKD and chronic pain.  Treated with IV fluid boluses with improvement. Blood sugars were 456 to start.  Tx with 3 days IV rocephin, urine cx + for E coli, now with d/c on 4 days cephalexin and good compliance with this at home.  Does have some sense of possible retention, but cant describe this actually happening.  Blood cultures neg.  AKI improved to baseline CKD.  Also found to have severe hypothryoidism, with d/c on 225 mcg levothyroxine, with plan to f/u outpt endo.  Also d/c home on lantus 18 units and humalog SSI, but on review, phone note indicates Dr Dwyane Dee asked pt to change to lantus 10 units, and 70/30 at 8 units in AM, and 12 unit in PM.  Unfortunately, there was some misunderstanding, and pt took 70/30 at 18 units twice per day yesterday with several low sugars but managed at home.  Pt with chronic pain, asks for tramadol refill.  O/w overall doing well cognitively back to baseline.  Husband asks for recheck urine as well as labs.  He is informed the thyroid testing aspect should ideally wait for 3-4 wks to reassess.  Pt states has some flomax at home.  No new complaints, just quite fatigued with overall debility.  PT has seen once in the home, and tried to reschedule the second for 9am today, but husband told them that was too early, so this was not accomplished.     Wt Readings from Last 3 Encounters:  11/24/20 (!) 306 lb (138.8 kg)  11/09/20 298 lb 15.1 oz (135.6 kg)  10/11/20 299 lb (135.6 kg)   BP Readings from Last 3 Encounters:  11/24/20 132/80  11/11/20 (!) 172/83  10/11/20 118/78        Transitional Care Management elements noted today: 1)  Date of D/C: as above 2)  Medication reconciliation:  done  today at end visit 3)  Review of D/C summary or other information:  done today 4)  Review of need for f/u on pending diagnostic tests and treatments:  done today - for urine, cbc, bmp 5)  Review of need for Interaction with other providers who will assume or resume care of pt specific problems: done today - for f/u Dr Dwyane Dee 6)  Education of patient/family/guardian or caregiver: done today - husband  Past Medical History:  Diagnosis Date  . Anxiety   . Arthritis   . Blood dyscrasia    "FREE BLEEDER"  . CERVICAL RADICULOPATHY, LEFT   . Chronic back pain   . CKD (chronic kidney disease)    DR. SANFORD  [facility]  . COMMON MIGRAINE   . CORONARY ARTERY DISEASE   . Cough    CURRENT COLD  . Cystitis   . DEPRESSION   . Diabetes mellitus, type II ([place])   . DIVERTICULOSIS-COLON   . Dysrhythmia    palpitations  . Gastric ulcer 04/2008  . Gastroparesis   . GERD (gastroesophageal reflux disease)   . Hiatal hernia   . Hyperlipidemia   . Hypertension   . Hypothyroidism   . INSOMNIA-SLEEP DISORDER-UNSPEC   . Iron deficiency anemia   .  Wears glasses    Past Surgical History:  Procedure Laterality Date  . ABDOMINAL HYSTERECTOMY    . ANTERIOR LAT LUMBAR FUSION Left 08/13/2017   Procedure: LEFT SIDED LUMBAR 3-4 LATERAL INTERBODY FUSION WITH INSTRUMENTATION AND ALLOGRAFT;  Surgeon: Phylliss Bob, MD;  Location: [facility];  Service: Orthopedics;  Laterality: Left;  LEFT SIDED LUMBAR 3-4 LATERAL INTERBODY FUSION WITH INSTRUMENTATION AND ALLOGRAFT; REQUEST 3 HOURS  . Back Stimulator  07/2018  . BACK SURGERY  03/2016  . BREAST EXCISIONAL BIOPSY Right    40 years ago  . CHOLECYSTECTOMY  06/2009  . COLONOSCOPY    . ESOPHAGOGASTRODUODENOSCOPY    . EYE SURGERY Bilateral    lasik  . Gastric Wedge resection lipoma  11/2007   x2 with laparotomy and gastrostomy  . JOINT REPLACEMENT    . Left knee replacement    . Rigth knee replacement with revision  04/2008   Dr. Berenice Primas  . ROTATOR CUFF REPAIR  Left 01/2009  . s/p bladder surgury  09/2009   Dr. Terance Hart  . SPINAL CORD STIMULATOR INSERTION N/A 09/11/2018   Procedure: LUMBAR SPINAL CORD STIMULATOR INSERTION;  Surgeon: Clydell Hakim, MD;  Location: [facility];  Service: Neurosurgery;  Laterality: N/A;  LUMBAR SPINAL CORD STIMULATOR INSERTION    reports that she has quit smoking. She has never used smokeless tobacco. She reports that she does not drink alcohol and does not use drugs. family history includes Breast cancer (age of onset: 65) in an other family member; Breast cancer (age of onset: 46) in her sister; Breast cancer (age of onset: 56) in her sister; Coronary artery disease in an other family member; Diabetes in her brother, brother, and sister; Heart disease in her brother and sister; Hypertension in an other family member. Allergies  Allergen Reactions  . Ciprofloxacin Shortness Of Breath and Other (See Comments)    Dizziness   . Hydrocodone Shortness Of Breath and Other (See Comments)    Tachycardia  . Hydrocortisone   . Penicillins Hives    Pt just remembers hives Has patient had a PCN reaction causing immediate rash, facial/tongue/throat swelling, SOB or lightheadedness with hypotension: No Has patient had a PCN reaction causing severe rash involving mucus membranes or skin necrosis: No Has patient had a PCN reaction that required hospitalization No Has patient had a PCN reaction occurring within the last 10 years: No If all of the above answers are "NO", then may proceed with Cephalosporin use.    Current Outpatient Medications on File Prior to Visit  Medication Sig Dispense Refill  . aspirin 81 MG EC tablet Take 81 mg by mouth daily.    Marland Kitchen atorvastatin (LIPITOR) 40 MG tablet TAKE ONE TABLET BY MOUTH EVERY NIGHT AT BEDTIME (Patient taking differently: Take 40 mg by mouth at bedtime.) 90 tablet 3  . cloNIDine (CATAPRES) 0.1 MG tablet TAKE ONE TABLET BY MOUTH TWICE A DAY (Patient taking differently: Take 0.1 mg by mouth 2  (two) times daily.) 60 tablet 2  . Continuous Blood Gluc Receiver (FREESTYLE LIBRE 2 READER) DEVI Use as directed twice daily E11.9 1 each 0  . Continuous Blood Gluc Sensor (FREESTYLE LIBRE 2 SENSOR) MISC Use as directed once bi-weekly E11.9 6 each 3  . ELMIRON 100 MG capsule Take 200 mg by mouth 2 (two) times daily.     Marland Kitchen FLUoxetine (PROZAC) 20 MG capsule TAKE ONE CAPSULE BY MOUTH DAILY 90 capsule 3  . gabapentin (NEURONTIN) 600 MG tablet Take 600 mg by mouth See admin  instructions. 1 tab in the am and at noon. 2 hs    . glucose blood (FREESTYLE TEST STRIPS) test strip Use as instructed to check blood sugar 4 times daily. 100 each 3  . insulin glargine (LANTUS SOLOSTAR) 100 UNIT/ML Solostar Pen Inject 18 Units into the skin daily. (Patient taking differently: Inject 10 Units into the skin daily.) 15 mL 2  . insulin lispro (HUMALOG) 100 UNIT/ML injection Inject 3-20 Units into the skin See admin instructions. Per sliding scale 3 times daily    . isosorbide mononitrate (IMDUR) 60 MG 24 hr tablet Take 1 tablet (60 mg total) by mouth daily. 90 tablet 3  . levothyroxine (SYNTHROID) 75 MCG tablet Take 3 tablets (225 mcg total) by mouth daily at 6 (six) AM. 90 tablet 0  . linaclotide (LINZESS) 72 MCG capsule Take 1 capsule (72 mcg total) by mouth daily before breakfast. 30 capsule 11  . losartan-hydrochlorothiazide (HYZAAR) 100-25 MG tablet Take 1 tablet by mouth daily.    . metoprolol succinate (TOPROL-XL) 50 MG 24 hr tablet TAKE 1 TABLET BY MOUTH DAILY WITH OR IMMEDIATELY FOLLOWING A MEAL (Patient taking differently: Take 50 mg by mouth daily.) 30 tablet 1  . nitroGLYCERIN (NITROSTAT) 0.4 MG SL tablet Place 1 tablet (0.4 mg total) under the tongue every 5 (five) minutes as needed for chest pain. 20 tablet 0  . ondansetron (ZOFRAN) 4 MG tablet Take 1 tablet (4 mg total) by mouth every 8 (eight) hours as needed for nausea or vomiting. 30 tablet 1  . pantoprazole (PROTONIX) 40 MG tablet TAKE 1 TABLET(40 MG)  BY MOUTH TWICE DAILY 180 tablet 3  . polyethylene glycol (MIRALAX / GLYCOLAX) 17 g packet Take 17 g by mouth daily as needed. (Patient not taking: Reported on 11/24/2020) 14 each 0   No current facility-administered medications on file prior to visit.        ROS:  All others reviewed and negative.  Objective        PE:  BP 132/80   Pulse 61   Temp 98.1 F (36.7 C) (Oral)   Ht '5\' 6"'$  (1.676 m)   Wt (!) 306 lb (138.8 kg)   SpO2 99%   BMI 49.39 kg/m                 Constitutional: Pt appears in NAD               HENT: Head: NCAT.                Right Ear: External ear normal.                 Left Ear: External ear normal.                Eyes: . Pupils are equal, round, and reactive to light. Conjunctivae and EOM are normal               Nose: without d/c or deformity               Neck: Neck supple. Gross normal ROM               Cardiovascular: Normal rate and regular rhythm.                 Pulmonary/Chest: Effort normal and breath sounds without rales or wheezing.                Abd:  Soft, NT, ND, + BS, no organomegaly  Neurological: Pt is alert. At baseline orientation, motor grossly intact               Skin: Skin is warm. No rashes, no other new lesions, LE edema - trace bilat               Psychiatric: Pt behavior is normal without agitation   Micro: none  Cardiac tracings I have personally interpreted today:  none  Pertinent Radiological findings (summarize): none   Lab Results  Component Value Date   WBC 5.1 11/11/2020   HGB 9.4 (L) 11/11/2020   HCT 28.5 (L) 11/11/2020   PLT 189 11/11/2020   GLUCOSE 190 (H) 11/11/2020   CHOL 175 01/26/2020   TRIG 71 01/26/2020   HDL 56 01/26/2020   LDLDIRECT 87.0 01/25/2020   LDLCALC 105 (H) 01/26/2020   ALT 25 11/09/2020   AST 25 11/09/2020   NA 137 11/11/2020   K 3.5 11/11/2020   CL 104 11/11/2020   CREATININE 1.51 (H) 11/11/2020   BUN 13 11/11/2020   CO2 21 (L) 11/11/2020   TSH 45.164 (H) 11/09/2020    INR 1.3 (H) 11/09/2020   HGBA1C 8.1 (A) 10/03/2020   MICROALBUR 1.5 07/06/2020   Assessment/Plan:  KALOLAINE HIOTT is a 70 y.o. Black or African American [2] female with  has a past medical history of Anxiety, Arthritis, Blood dyscrasia, CERVICAL RADICULOPATHY, LEFT, Chronic back pain, CKD (chronic kidney disease), COMMON MIGRAINE, CORONARY ARTERY DISEASE, Cough, Cystitis, DEPRESSION, Diabetes mellitus, type II ([place]), DIVERTICULOSIS-COLON, Dysrhythmia, Gastric ulcer (04/2008), Gastroparesis, GERD (gastroesophageal reflux disease), Hiatal hernia, Hyperlipidemia, Hypertension, Hypothyroidism, INSOMNIA-SLEEP DISORDER-UNSPEC, Iron deficiency anemia, and Wears glasses.  Acute lower UTI [facility] for repeat urine studies, cbc, bmp as suggested, ok for flomax for sense of mild retention post d/c but ok to stop if not helping  CKD (chronic kidney disease) stage 4, GFR 15-29 ml/min (HCC) Lab Results  Component Value Date   CREATININE 1.51 (H) 11/11/2020   Stable overall, cont to avoid nephrotoxins  Hypothyroidism Will need f/u TFTs in 4 wks  Insulin dependent type 2 diabetes mellitus ([place]) D/w pt and family, lantus to remain at 10 units and 70/30 to be changed to dosing intended per Dr Dwyane Dee at 8 units in AM, and 12 in PM (not 18 bid)  Chronic pain For tramadol refill, cont PT at home as arranged  Followup: Return in about 4 weeks (around 12/22/2020).  Sherry Cower, MD 11/24/2020 8:17 PM [name]

## 2020-11-24 NOTE — Assessment & Plan Note (Signed)
D/w pt and family, lantus to remain at 10 units and 70/30 to be changed to dosing intended per Dr Dwyane Dee at 8 units in AM, and 12 in PM (not 18 bid)

## 2020-11-24 NOTE — Assessment & Plan Note (Signed)
Ok for repeat urine studies, cbc, bmp as suggested, ok for flomax for sense of mild retention post d/c but ok to stop if not helping

## 2020-11-24 NOTE — Assessment & Plan Note (Signed)
Will need f/u TFTs in 4 wks

## 2020-11-26 LAB — CBC WITH DIFFERENTIAL/PLATELET
Absolute Monocytes: 350 cells/uL (ref 200–950)
Basophils Absolute: 33 cells/uL (ref 0–200)
Basophils Relative: 0.5 %
Eosinophils Absolute: 251 cells/uL (ref 15–500)
Eosinophils Relative: 3.8 %
HCT: 27.1 % — ABNORMAL LOW (ref 35.0–45.0)
Hemoglobin: 8.8 g/dL — ABNORMAL LOW (ref 11.7–15.5)
Lymphs Abs: 2013 cells/uL (ref 850–3900)
MCH: 33.2 pg — ABNORMAL HIGH (ref 27.0–33.0)
MCHC: 32.5 g/dL (ref 32.0–36.0)
MCV: 102.3 fL — ABNORMAL HIGH (ref 80.0–100.0)
MPV: 11.6 fL (ref 7.5–12.5)
Monocytes Relative: 5.3 %
Neutro Abs: 3953 cells/uL (ref 1500–7800)
Neutrophils Relative %: 59.9 %
Platelets: 235 10*3/uL (ref 140–400)
RBC: 2.65 10*6/uL — ABNORMAL LOW (ref 3.80–5.10)
RDW: 12.9 % (ref 11.0–15.0)
Total Lymphocyte: 30.5 %
WBC: 6.6 10*3/uL (ref 3.8–10.8)

## 2020-11-26 LAB — BASIC METABOLIC PANEL
BUN/Creatinine Ratio: 19 (calc) (ref 6–22)
BUN: 52 mg/dL — ABNORMAL HIGH (ref 7–25)
CO2: 23 mmol/L (ref 20–32)
Calcium: 9.5 mg/dL (ref 8.6–10.4)
Chloride: 108 mmol/L (ref 98–110)
Creat: 2.78 mg/dL — ABNORMAL HIGH (ref 0.60–0.93)
Glucose, Bld: 107 mg/dL — ABNORMAL HIGH (ref 65–99)
Potassium: 4.1 mmol/L (ref 3.5–5.3)
Sodium: 141 mmol/L (ref 135–146)

## 2020-11-26 LAB — URINALYSIS, ROUTINE W REFLEX MICROSCOPIC
Bacteria, UA: NONE SEEN /HPF
Bilirubin Urine: NEGATIVE
Glucose, UA: NEGATIVE
Hgb urine dipstick: NEGATIVE
Hyaline Cast: NONE SEEN /LPF
Ketones, ur: NEGATIVE
Leukocytes,Ua: NEGATIVE
Nitrite: NEGATIVE
Protein, ur: NEGATIVE
Specific Gravity, Urine: 1.012 (ref 1.001–1.03)
pH: 5.5 (ref 5.0–8.0)

## 2020-11-26 LAB — URINE CULTURE

## 2020-11-27 DIAGNOSIS — I129 Hypertensive chronic kidney disease with stage 1 through stage 4 chronic kidney disease, or unspecified chronic kidney disease: Secondary | ICD-10-CM | POA: Diagnosis not present

## 2020-11-27 DIAGNOSIS — E1165 Type 2 diabetes mellitus with hyperglycemia: Secondary | ICD-10-CM | POA: Diagnosis not present

## 2020-11-27 DIAGNOSIS — H2513 Age-related nuclear cataract, bilateral: Secondary | ICD-10-CM | POA: Diagnosis not present

## 2020-11-27 DIAGNOSIS — E1169 Type 2 diabetes mellitus with other specified complication: Secondary | ICD-10-CM | POA: Diagnosis not present

## 2020-11-27 DIAGNOSIS — H25811 Combined forms of age-related cataract, right eye: Secondary | ICD-10-CM | POA: Diagnosis not present

## 2020-11-27 DIAGNOSIS — G9341 Metabolic encephalopathy: Secondary | ICD-10-CM | POA: Diagnosis not present

## 2020-11-27 DIAGNOSIS — H2511 Age-related nuclear cataract, right eye: Secondary | ICD-10-CM | POA: Diagnosis not present

## 2020-11-27 DIAGNOSIS — N184 Chronic kidney disease, stage 4 (severe): Secondary | ICD-10-CM | POA: Diagnosis not present

## 2020-11-27 DIAGNOSIS — E1122 Type 2 diabetes mellitus with diabetic chronic kidney disease: Secondary | ICD-10-CM | POA: Diagnosis not present

## 2020-11-28 ENCOUNTER — Encounter: Payer: Self-pay | Admitting: Internal Medicine

## 2020-11-28 ENCOUNTER — Other Ambulatory Visit: Payer: Self-pay | Admitting: Internal Medicine

## 2020-11-28 DIAGNOSIS — H2511 Age-related nuclear cataract, right eye: Secondary | ICD-10-CM | POA: Diagnosis not present

## 2020-11-28 DIAGNOSIS — H43813 Vitreous degeneration, bilateral: Secondary | ICD-10-CM | POA: Diagnosis not present

## 2020-11-28 MED ORDER — AZITHROMYCIN 250 MG PO TABS: ORAL_TABLET | ORAL | 1 refills | Status: DC

## 2020-11-28 MED ORDER — DOXYCYCLINE HYCLATE 100 MG PO TABS
100.0000 mg | ORAL_TABLET | Freq: Two times a day (BID) | ORAL | 0 refills | Status: DC
Start: 2020-11-28 — End: 2021-03-12

## 2020-11-30 ENCOUNTER — Other Ambulatory Visit: Payer: Self-pay | Admitting: Internal Medicine

## 2020-11-30 NOTE — Telephone Encounter (Signed)
Please refill as per office routine med refill policy (all routine meds refilled for 3 mo or monthly per pt preference up to one year from last visit, then month to month grace period for 3 mo, then further med refills will have to be denied)  

## 2020-12-01 ENCOUNTER — Telehealth: Payer: Self-pay

## 2020-12-01 ENCOUNTER — Telehealth: Payer: Self-pay | Admitting: Internal Medicine

## 2020-12-01 NOTE — Telephone Encounter (Signed)
Pt notified of Dr Jenny Reichmann result note: "The test results show that your current treatment is OK, as the tests are stable, except for several things. The urine culture appears to be having another type of infection with a different bacteria which can be more difficult to treat with pill antibiotics when you cannot take penicillin or cipro. Also the Anemia is steadily worsening in the past few wks for unclear reason, as I dont believe you have had bleeding. Also the Kidney function is worse today than when it was last checked in the hospital, again the reason for this is not clear  We need to: 1) Treat with a combination of pill antibiotics - azithromycin and doxycycline to try to get rid of the urine infection before it gets worse 2) Watch for any bleeding such as bright red blood from anywhere, or black stools 3) Hold the losartan HCT for now as this can make the kidney function somewhat worse 4) Make a return appointment in the office for 1 week to recheck; I will ask the office to call you"  Pt verb understanding. Appt made for Mar 4.

## 2020-12-01 NOTE — Telephone Encounter (Signed)
[facility] calling to report 1 missed [facility] visit

## 2020-12-04 DIAGNOSIS — N3020 Other chronic cystitis without hematuria: Secondary | ICD-10-CM | POA: Diagnosis not present

## 2020-12-04 DIAGNOSIS — R8271 Bacteriuria: Secondary | ICD-10-CM | POA: Diagnosis not present

## 2020-12-06 DIAGNOSIS — G9341 Metabolic encephalopathy: Secondary | ICD-10-CM | POA: Diagnosis not present

## 2020-12-06 DIAGNOSIS — I129 Hypertensive chronic kidney disease with stage 1 through stage 4 chronic kidney disease, or unspecified chronic kidney disease: Secondary | ICD-10-CM | POA: Diagnosis not present

## 2020-12-06 DIAGNOSIS — E1122 Type 2 diabetes mellitus with diabetic chronic kidney disease: Secondary | ICD-10-CM | POA: Diagnosis not present

## 2020-12-06 DIAGNOSIS — E1165 Type 2 diabetes mellitus with hyperglycemia: Secondary | ICD-10-CM | POA: Diagnosis not present

## 2020-12-06 DIAGNOSIS — E1169 Type 2 diabetes mellitus with other specified complication: Secondary | ICD-10-CM | POA: Diagnosis not present

## 2020-12-06 DIAGNOSIS — N184 Chronic kidney disease, stage 4 (severe): Secondary | ICD-10-CM | POA: Diagnosis not present

## 2020-12-07 ENCOUNTER — Other Ambulatory Visit: Payer: Medicare Other

## 2020-12-08 ENCOUNTER — Encounter: Payer: Self-pay | Admitting: Internal Medicine

## 2020-12-08 ENCOUNTER — Other Ambulatory Visit: Payer: Self-pay

## 2020-12-08 ENCOUNTER — Ambulatory Visit (INDEPENDENT_AMBULATORY_CARE_PROVIDER_SITE_OTHER): Payer: Medicare Other | Admitting: Internal Medicine

## 2020-12-08 VITALS — BP 118/60 | HR 82 | Ht 66.0 in | Wt 289.0 lb

## 2020-12-08 DIAGNOSIS — D649 Anemia, unspecified: Secondary | ICD-10-CM

## 2020-12-08 DIAGNOSIS — N189 Chronic kidney disease, unspecified: Secondary | ICD-10-CM

## 2020-12-08 DIAGNOSIS — R112 Nausea with vomiting, unspecified: Secondary | ICD-10-CM | POA: Diagnosis not present

## 2020-12-08 DIAGNOSIS — E1165 Type 2 diabetes mellitus with hyperglycemia: Secondary | ICD-10-CM

## 2020-12-08 DIAGNOSIS — N390 Urinary tract infection, site not specified: Secondary | ICD-10-CM | POA: Diagnosis not present

## 2020-12-08 DIAGNOSIS — N179 Acute kidney failure, unspecified: Secondary | ICD-10-CM | POA: Diagnosis not present

## 2020-12-08 LAB — CBC WITH DIFFERENTIAL/PLATELET
Basophils Absolute: 0.1 10*3/uL (ref 0.0–0.1)
Basophils Relative: 0.9 % (ref 0.0–3.0)
Eosinophils Absolute: 0.2 10*3/uL (ref 0.0–0.7)
Eosinophils Relative: 2.2 % (ref 0.0–5.0)
HCT: 30.7 % — ABNORMAL LOW (ref 36.0–46.0)
Hemoglobin: 10 g/dL — ABNORMAL LOW (ref 12.0–15.0)
Lymphocytes Relative: 29.3 % (ref 12.0–46.0)
Lymphs Abs: 2.3 10*3/uL (ref 0.7–4.0)
MCHC: 32.5 g/dL (ref 30.0–36.0)
MCV: 100.2 fl — ABNORMAL HIGH (ref 78.0–100.0)
Monocytes Absolute: 0.5 10*3/uL (ref 0.1–1.0)
Monocytes Relative: 5.9 % (ref 3.0–12.0)
Neutro Abs: 4.8 10*3/uL (ref 1.4–7.7)
Neutrophils Relative %: 61.7 % (ref 43.0–77.0)
Platelets: 196 10*3/uL (ref 150.0–400.0)
RBC: 3.07 Mil/uL — ABNORMAL LOW (ref 3.87–5.11)
RDW: 13.5 % (ref 11.5–15.5)
WBC: 7.8 10*3/uL (ref 4.0–10.5)

## 2020-12-08 LAB — IBC PANEL
Iron: 47 ug/dL (ref 42–145)
Saturation Ratios: 16.1 % — ABNORMAL LOW (ref 20.0–50.0)
Transferrin: 208 mg/dL — ABNORMAL LOW (ref 212.0–360.0)

## 2020-12-08 LAB — BASIC METABOLIC PANEL
BUN: 24 mg/dL — ABNORMAL HIGH (ref 6–23)
CO2: 25 mEq/L (ref 19–32)
Calcium: 9.6 mg/dL (ref 8.4–10.5)
Chloride: 104 mEq/L (ref 96–112)
Creatinine, Ser: 2.27 mg/dL — ABNORMAL HIGH (ref 0.40–1.20)
GFR: 21.3 mL/min — ABNORMAL LOW (ref >60.00)
Glucose, Bld: 299 mg/dL — ABNORMAL HIGH (ref 70–99)
Potassium: 4.4 mEq/L (ref 3.5–5.1)
Sodium: 139 mEq/L (ref 135–145)

## 2020-12-08 LAB — FERRITIN: Ferritin: 87.8 ng/mL (ref 10.0–291.0)

## 2020-12-08 MED ORDER — ONDANSETRON HCL 4 MG PO TABS: 4.0000 mg | ORAL_TABLET | Freq: Three times a day (TID) | ORAL | 1 refills | Status: DC | PRN

## 2020-12-08 NOTE — Progress Notes (Signed)
Patient ID: [name], female   DOB: November 09, 1950, 70 y.o.   MRN: [id number]        Chief Complaint: 1 wk f/u uti, anemia, aki and n/v       HPI:  [name] is a 70 y.o. female here with above, overall struggling to keep up with fluids and decreased po intake due to recurring n/v, happened again last pm, and now possibly associated with mild worsening generalized abd pain, generalized weakness and loose stools/dairrhea without worsening fever, chills, blood  Is currently taking the azitromycin/doxycycline antibiotic combo - unusual tx due to finding of recurrent UTI last visit - not E coli as in last hospn, but with pseudomona, difficult to tx due to pt allergies.  Denies urinary symptoms such as dysuria, frequency, urgency, flank pain, hematuria or n/v, fever, chills  Also had significant AKI and anemia last labs, no overt bleeding.  Today pt states she actually feels improved and appears more stamina and brighter outlook but hard to say why.  No further n/v since yesterday.    Wt Readings from Last 3 Encounters:  12/08/20 289 lb (131.1 kg)  11/24/20 (!) 306 lb (138.8 kg)  11/09/20 298 lb 15.1 oz (135.6 kg)   BP Readings from Last 3 Encounters:  12/08/20 118/60  11/24/20 132/80  11/11/20 (!) 172/83         Past Medical History:  Diagnosis Date  . Anxiety   . Arthritis   . Blood dyscrasia    "FREE BLEEDER"  . CERVICAL RADICULOPATHY, LEFT   . Chronic back pain   . CKD (chronic kidney disease)    DR. SANFORD  [facility]  . COMMON MIGRAINE   . CORONARY ARTERY DISEASE   . Cough    CURRENT COLD  . Cystitis   . DEPRESSION   . Diabetes mellitus, type II ([place])   . DIVERTICULOSIS-COLON   . Dysrhythmia    palpitations  . Gastric ulcer 04/2008  . Gastroparesis   . GERD (gastroesophageal reflux disease)   . Hiatal hernia   . Hyperlipidemia   . Hypertension   . Hypothyroidism   . INSOMNIA-SLEEP DISORDER-UNSPEC   . Iron deficiency anemia   . Wears glasses    Past  Surgical History:  Procedure Laterality Date  . ABDOMINAL HYSTERECTOMY    . ANTERIOR LAT LUMBAR FUSION Left 08/13/2017   Procedure: LEFT SIDED LUMBAR 3-4 LATERAL INTERBODY FUSION WITH INSTRUMENTATION AND ALLOGRAFT;  Surgeon: Phylliss Bob, MD;  Location: [facility];  Service: Orthopedics;  Laterality: Left;  LEFT SIDED LUMBAR 3-4 LATERAL INTERBODY FUSION WITH INSTRUMENTATION AND ALLOGRAFT; REQUEST 3 HOURS  . Back Stimulator  07/2018  . BACK SURGERY  03/2016  . BREAST EXCISIONAL BIOPSY Right    40 years ago  . CHOLECYSTECTOMY  06/2009  . COLONOSCOPY    . ESOPHAGOGASTRODUODENOSCOPY    . EYE SURGERY Bilateral    lasik  . Gastric Wedge resection lipoma  11/2007   x2 with laparotomy and gastrostomy  . JOINT REPLACEMENT    . Left knee replacement    . Rigth knee replacement with revision  04/2008   Dr. Berenice Primas  . ROTATOR CUFF REPAIR Left 01/2009  . s/p bladder surgury  09/2009   Dr. Terance Hart  . SPINAL CORD STIMULATOR INSERTION N/A 09/11/2018   Procedure: LUMBAR SPINAL CORD STIMULATOR INSERTION;  Surgeon: Clydell Hakim, MD;  Location: [facility];  Service: Neurosurgery;  Laterality: N/A;  LUMBAR SPINAL CORD STIMULATOR INSERTION    reports that she  has quit smoking. She has never used smokeless tobacco. She reports that she does not drink alcohol and does not use drugs. family history includes Breast cancer (age of onset: 31) in an other family member; Breast cancer (age of onset: 75) in her sister; Breast cancer (age of onset: 52) in her sister; Coronary artery disease in an other family member; Diabetes in her brother, brother, and sister; Heart disease in her brother and sister; Hypertension in an other family member. Allergies  Allergen Reactions  . Ciprofloxacin Shortness Of Breath and Other (See Comments)    Dizziness   . Hydrocodone Shortness Of Breath and Other (See Comments)    Tachycardia  . Hydrocortisone   . Penicillins Hives    Pt just remembers hives Has patient had a PCN reaction causing  immediate rash, facial/tongue/throat swelling, SOB or lightheadedness with hypotension: No Has patient had a PCN reaction causing severe rash involving mucus membranes or skin necrosis: No Has patient had a PCN reaction that required hospitalization No Has patient had a PCN reaction occurring within the last 10 years: No If all of the above answers are "NO", then may proceed with Cephalosporin use.    Current Outpatient Medications on File Prior to Visit  Medication Sig Dispense Refill  . aspirin 81 MG EC tablet Take 81 mg by mouth daily.    Marland Kitchen atorvastatin (LIPITOR) 40 MG tablet TAKE ONE TABLET BY MOUTH EVERY NIGHT AT BEDTIME (Patient taking differently: Take 40 mg by mouth at bedtime.) 90 tablet 3  . cloNIDine (CATAPRES) 0.1 MG tablet TAKE ONE TABLET BY MOUTH TWICE A DAY (Patient taking differently: Take 0.1 mg by mouth 2 (two) times daily.) 60 tablet 2  . Continuous Blood Gluc Receiver (FREESTYLE LIBRE 2 READER) DEVI Use as directed twice daily E11.9 1 each 0  . Continuous Blood Gluc Sensor (FREESTYLE LIBRE 2 SENSOR) MISC Use as directed once bi-weekly E11.9 6 each 3  . ELMIRON 100 MG capsule Take 200 mg by mouth 2 (two) times daily.     Marland Kitchen FLUoxetine (PROZAC) 20 MG capsule TAKE ONE CAPSULE BY MOUTH DAILY 90 capsule 3  . gabapentin (NEURONTIN) 600 MG tablet Take 600 mg by mouth See admin instructions. 1 tab in the am and at noon. 2 hs    . glucose blood (FREESTYLE TEST STRIPS) test strip Use as instructed to check blood sugar 4 times daily. 100 each 3  . insulin glargine (LANTUS SOLOSTAR) 100 UNIT/ML Solostar Pen Inject 18 Units into the skin daily. (Patient taking differently: Inject 10 Units into the skin daily.) 15 mL 2  . insulin lispro (HUMALOG) 100 UNIT/ML injection Inject 3-20 Units into the skin See admin instructions. Per sliding scale 3 times daily    . isosorbide mononitrate (IMDUR) 60 MG 24 hr tablet Take 1 tablet (60 mg total) by mouth daily. 90 tablet 3  . levothyroxine  (SYNTHROID) 75 MCG tablet Take 3 tablets (225 mcg total) by mouth daily at 6 (six) AM. 90 tablet 0  . linaclotide (LINZESS) 72 MCG capsule Take 1 capsule (72 mcg total) by mouth daily before breakfast. 30 capsule 11  . metoprolol succinate (TOPROL-XL) 50 MG 24 hr tablet TAKE 1 TABLET BY MOUTH DAILY WITH OR IMMEDIATELY FOLLOWING A MEAL 90 tablet 3  . nitroGLYCERIN (NITROSTAT) 0.4 MG SL tablet Place 1 tablet (0.4 mg total) under the tongue every 5 (five) minutes as needed for chest pain. 20 tablet 0  . pantoprazole (PROTONIX) 40 MG tablet TAKE 1 TABLET(40  MG) BY MOUTH TWICE DAILY 180 tablet 3  . polyethylene glycol (MIRALAX / GLYCOLAX) 17 g packet Take 17 g by mouth daily as needed. 14 each 0  . traMADol (ULTRAM) 50 MG tablet Take 1 tablet (50 mg total) by mouth every 6 (six) hours as needed. 8 tablet 0  . azithromycin (ZITHROMAX) 250 MG tablet 2 tab by mouth day 1, then 1 per day (Patient not taking: Reported on 12/08/2020) 6 tablet 1  . chlorhexidine (PERIDEX) 0.12 % solution as directed.    . doxycycline (VIBRA-TABS) 100 MG tablet Take 1 tablet (100 mg total) by mouth 2 (two) times daily. (Patient not taking: Reported on 12/08/2020) 20 tablet 0  . losartan-hydrochlorothiazide (HYZAAR) 100-25 MG tablet Take 1 tablet by mouth daily. (Patient not taking: Reported on 12/08/2020)     No current facility-administered medications on file prior to visit.        ROS:  All others reviewed and negative.  Objective        PE:  BP 118/60   Pulse 82   Ht '5\' 6"'$  (1.676 m)   Wt 289 lb (131.1 kg)   SpO2 99%   BMI 46.65 kg/m                 Constitutional: Pt appears in NAD               HENT: Head: NCAT.                Right Ear: External ear normal.                 Left Ear: External ear normal.                Eyes: . Pupils are equal, round, and reactive to light. Conjunctivae and EOM are normal               Nose: without d/c or deformity               Neck: Neck supple. Gross normal ROM                Cardiovascular: Normal rate and regular rhythm.                 Pulmonary/Chest: Effort normal and breath sounds without rales or wheezing.                Abd:  Soft, NT, ND, + BS, no organomegaly               Neurological: Pt is alert. At baseline orientation, motor grossly intact               Skin: Skin is warm. No rashes, no other new lesions, LE edema - none               Psychiatric: Pt behavior is normal without agitation   Micro: none  Cardiac tracings I have personally interpreted today:  none  Pertinent Radiological findings (summarize): none   Lab Results  Component Value Date   WBC 7.8 12/08/2020   HGB 10.0 (L) 12/08/2020   HCT 30.7 (L) 12/08/2020   PLT 196.0 12/08/2020   GLUCOSE 299 (H) 12/08/2020   CHOL 175 01/26/2020   TRIG 71 01/26/2020   HDL 56 01/26/2020   LDLDIRECT 87.0 01/25/2020   LDLCALC 105 (H) 01/26/2020   ALT 25 11/09/2020   AST 25 11/09/2020   NA 139 12/08/2020   K 4.4 12/08/2020  CL 104 12/08/2020   CREATININE 2.27 (H) 12/08/2020   BUN 24 (H) 12/08/2020   CO2 25 12/08/2020   TSH 45.164 (H) 11/09/2020   INR 1.3 (H) 11/09/2020   HGBA1C 8.1 (A) 10/03/2020   MICROALBUR 1.5 07/06/2020   Assessment/Plan:  [name] is a 70 y.o. Black or African American [2] female with  has a past medical history of Anxiety, Arthritis, Blood dyscrasia, CERVICAL RADICULOPATHY, LEFT, Chronic back pain, CKD (chronic kidney disease), COMMON MIGRAINE, CORONARY ARTERY DISEASE, Cough, Cystitis, DEPRESSION, Diabetes mellitus, type II ([place]), DIVERTICULOSIS-COLON, Dysrhythmia, Gastric ulcer (04/2008), Gastroparesis, GERD (gastroesophageal reflux disease), Hiatal hernia, Hyperlipidemia, Hypertension, Hypothyroidism, INSOMNIA-SLEEP DISORDER-UNSPEC, Iron deficiency anemia, and Wears glasses.  Acute kidney injury superimposed on CKD ([facility]) Pt has been working on incrsaed po lfuids and holding hyzaar - for f/u lab today, consider refer to ED for IVF if worsening  Acute lower  UTI May be improving pseudomal uti  To finish current azithro/doxy course  ,  to f/u any worsening symptoms or concerns  ANEMIA-NOS No overt bleeding, suspect ? Low grade GI blood loss such as gastritis - for f/u lab, consider transfuse < 7, may need GI f/u  Diabetes ([place]) Lab Results  Component Value Date   HGBA1C 8.1 (A) 10/03/2020   Stable, pt to continue current medical treatment lantus, humalog,   Current Outpatient Medications (Endocrine & Metabolic):  .  insulin glargine (LANTUS SOLOSTAR) 100 UNIT/ML Solostar Pen, Inject 18 Units into the skin daily. (Patient taking differently: Inject 10 Units into the skin daily.) .  insulin lispro (HUMALOG) 100 UNIT/ML injection, Inject 3-20 Units into the skin See admin instructions. Per sliding scale 3 times daily .  levothyroxine (SYNTHROID) 75 MCG tablet, Take 3 tablets (225 mcg total) by mouth daily at 6 (six) AM.  Current Outpatient Medications (Cardiovascular):  .  atorvastatin (LIPITOR) 40 MG tablet, TAKE ONE TABLET BY MOUTH EVERY NIGHT AT BEDTIME (Patient taking differently: Take 40 mg by mouth at bedtime.) .  cloNIDine (CATAPRES) 0.1 MG tablet, TAKE ONE TABLET BY MOUTH TWICE A DAY (Patient taking differently: Take 0.1 mg by mouth 2 (two) times daily.) .  isosorbide mononitrate (IMDUR) 60 MG 24 hr tablet, Take 1 tablet (60 mg total) by mouth daily. .  metoprolol succinate (TOPROL-XL) 50 MG 24 hr tablet, TAKE 1 TABLET BY MOUTH DAILY WITH OR IMMEDIATELY FOLLOWING A MEAL .  nitroGLYCERIN (NITROSTAT) 0.4 MG SL tablet, Place 1 tablet (0.4 mg total) under the tongue every 5 (five) minutes as needed for chest pain. Marland Kitchen  losartan-hydrochlorothiazide (HYZAAR) 100-25 MG tablet, Take 1 tablet by mouth daily. (Patient not taking: Reported on 12/08/2020)   Current Outpatient Medications (Analgesics):  .  aspirin 81 MG EC tablet, Take 81 mg by mouth daily. .  traMADol (ULTRAM) 50 MG tablet, Take 1 tablet (50 mg total) by mouth every 6 (six) hours as  needed.   Current Outpatient Medications (Other):  Marland Kitchen  Continuous Blood Gluc Receiver (FREESTYLE LIBRE 2 READER) DEVI, Use as directed twice daily E11.9 .  Continuous Blood Gluc Sensor (FREESTYLE LIBRE 2 SENSOR) MISC, Use as directed once bi-weekly E11.9 .  ELMIRON 100 MG capsule, Take 200 mg by mouth 2 (two) times daily.  Marland Kitchen  FLUoxetine (PROZAC) 20 MG capsule, TAKE ONE CAPSULE BY MOUTH DAILY .  gabapentin (NEURONTIN) 600 MG tablet, Take 600 mg by mouth See admin instructions. 1 tab in the am and at noon. 2 hs .  glucose blood (FREESTYLE TEST STRIPS) test strip, Use  as instructed to check blood sugar 4 times daily. Marland Kitchen  linaclotide (LINZESS) 72 MCG capsule, Take 1 capsule (72 mcg total) by mouth daily before breakfast. .  ondansetron (ZOFRAN) 4 MG tablet, Take 1 tablet (4 mg total) by mouth every 8 (eight) hours as needed for nausea or vomiting. .  pantoprazole (PROTONIX) 40 MG tablet, TAKE 1 TABLET(40 MG) BY MOUTH TWICE DAILY .  polyethylene glycol (MIRALAX / GLYCOLAX) 17 g packet, Take 17 g by mouth daily as needed. Marland Kitchen  azithromycin (ZITHROMAX) 250 MG tablet, 2 tab by mouth day 1, then 1 per day (Patient not taking: Reported on 12/08/2020) .  chlorhexidine (PERIDEX) 0.12 % solution, as directed. .  doxycycline (VIBRA-TABS) 100 MG tablet, Take 1 tablet (100 mg total) by mouth 2 (two) times daily. (Patient not taking: Reported on 12/08/2020)   Gastroparesis Has hx of gastropesis, for zofran prn to continue, feel fortunately some improved today, cont to follow, consider GI for possible EGD if anemia and symtpom persist or worsen  Nausea & vomiting Has hx of gastropesis and PUD, for PPI and zofran prn to continue, feel fortunately some improved today, cont to follow, consider GI for possible EGD if anemia and symtpom persist or worsen   Followup: Return in about 3 months (around 03/10/2021).  Cathlean Cower, MD 12/10/2020 3:46 PM [facility]

## 2020-12-08 NOTE — Patient Instructions (Addendum)
Please take all new medication as prescribed - the zofran for nausea  Please continue all other medications as before, and refills have been done if requested.  Please have the pharmacy call with any other refills you may need.  Please keep your appointments with your specialists as you may have planned  Please go to the LAB at the blood drawing area for the tests to be done  You will be contacted by phone if any changes need to be made immediately.  Otherwise, you will receive a letter about your results with an explanation, but please check with MyChart first.  Please remember to sign up for MyChart if you have not done so, as this will be important to you in the future with finding out test results, communicating by private email, and scheduling acute appointments online when needed.  Please make an Appointment to return in 3 months

## 2020-12-10 ENCOUNTER — Encounter: Payer: Self-pay | Admitting: Internal Medicine

## 2020-12-10 DIAGNOSIS — R1030 Lower abdominal pain, unspecified: Secondary | ICD-10-CM | POA: Diagnosis not present

## 2020-12-10 DIAGNOSIS — N3001 Acute cystitis with hematuria: Secondary | ICD-10-CM | POA: Diagnosis not present

## 2020-12-10 NOTE — Assessment & Plan Note (Signed)
Pt has been working on incrsaed po lfuids and holding hyzaar - for f/u lab today, consider refer to ED for IVF if worsening

## 2020-12-10 NOTE — Assessment & Plan Note (Signed)
No overt bleeding, suspect ? Low grade GI blood loss such as gastritis - for f/u lab, consider transfuse < 7, may need GI f/u

## 2020-12-10 NOTE — Assessment & Plan Note (Signed)
Lab Results  Component Value Date   HGBA1C 8.1 (A) 10/03/2020   Stable, pt to continue current medical treatment lantus, humalog,   Current Outpatient Medications (Endocrine & Metabolic):  .  insulin glargine (LANTUS SOLOSTAR) 100 UNIT/ML Solostar Pen, Inject 18 Units into the skin daily. (Patient taking differently: Inject 10 Units into the skin daily.) .  insulin lispro (HUMALOG) 100 UNIT/ML injection, Inject 3-20 Units into the skin See admin instructions. Per sliding scale 3 times daily .  levothyroxine (SYNTHROID) 75 MCG tablet, Take 3 tablets (225 mcg total) by mouth daily at 6 (six) AM.  Current Outpatient Medications (Cardiovascular):  .  atorvastatin (LIPITOR) 40 MG tablet, TAKE ONE TABLET BY MOUTH EVERY NIGHT AT BEDTIME (Patient taking differently: Take 40 mg by mouth at bedtime.) .  cloNIDine (CATAPRES) 0.1 MG tablet, TAKE ONE TABLET BY MOUTH TWICE A DAY (Patient taking differently: Take 0.1 mg by mouth 2 (two) times daily.) .  isosorbide mononitrate (IMDUR) 60 MG 24 hr tablet, Take 1 tablet (60 mg total) by mouth daily. .  metoprolol succinate (TOPROL-XL) 50 MG 24 hr tablet, TAKE 1 TABLET BY MOUTH DAILY WITH OR IMMEDIATELY FOLLOWING A MEAL .  nitroGLYCERIN (NITROSTAT) 0.4 MG SL tablet, Place 1 tablet (0.4 mg total) under the tongue every 5 (five) minutes as needed for chest pain. Marland Kitchen  losartan-hydrochlorothiazide (HYZAAR) 100-25 MG tablet, Take 1 tablet by mouth daily. (Patient not taking: Reported on 12/08/2020)   Current Outpatient Medications (Analgesics):  .  aspirin 81 MG EC tablet, Take 81 mg by mouth daily. .  traMADol (ULTRAM) 50 MG tablet, Take 1 tablet (50 mg total) by mouth every 6 (six) hours as needed.   Current Outpatient Medications (Other):  Marland Kitchen  Continuous Blood Gluc Receiver (FREESTYLE LIBRE 2 READER) DEVI, Use as directed twice daily E11.9 .  Continuous Blood Gluc Sensor (FREESTYLE LIBRE 2 SENSOR) MISC, Use as directed once bi-weekly E11.9 .  ELMIRON 100 MG  capsule, Take 200 mg by mouth 2 (two) times daily.  Marland Kitchen  FLUoxetine (PROZAC) 20 MG capsule, TAKE ONE CAPSULE BY MOUTH DAILY .  gabapentin (NEURONTIN) 600 MG tablet, Take 600 mg by mouth See admin instructions. 1 tab in the am and at noon. 2 hs .  glucose blood (FREESTYLE TEST STRIPS) test strip, Use as instructed to check blood sugar 4 times daily. Marland Kitchen  linaclotide (LINZESS) 72 MCG capsule, Take 1 capsule (72 mcg total) by mouth daily before breakfast. .  ondansetron (ZOFRAN) 4 MG tablet, Take 1 tablet (4 mg total) by mouth every 8 (eight) hours as needed for nausea or vomiting. .  pantoprazole (PROTONIX) 40 MG tablet, TAKE 1 TABLET(40 MG) BY MOUTH TWICE DAILY .  polyethylene glycol (MIRALAX / GLYCOLAX) 17 g packet, Take 17 g by mouth daily as needed. Marland Kitchen  azithromycin (ZITHROMAX) 250 MG tablet, 2 tab by mouth day 1, then 1 per day (Patient not taking: Reported on 12/08/2020) .  chlorhexidine (PERIDEX) 0.12 % solution, as directed. .  doxycycline (VIBRA-TABS) 100 MG tablet, Take 1 tablet (100 mg total) by mouth 2 (two) times daily. (Patient not taking: Reported on 12/08/2020)

## 2020-12-10 NOTE — Assessment & Plan Note (Signed)
May be improving pseudomal uti  To finish current azithro/doxy course  ,  to f/u any worsening symptoms or concerns

## 2020-12-10 NOTE — Assessment & Plan Note (Signed)
Has hx of gastropesis, for zofran prn to continue, feel fortunately some improved today, cont to follow, consider GI for possible EGD if anemia and symtpom persist or worsen

## 2020-12-10 NOTE — Assessment & Plan Note (Signed)
Has hx of gastropesis and PUD, for PPI and zofran prn to continue, feel fortunately some improved today, cont to follow, consider GI for possible EGD if anemia and symtpom persist or worsen

## 2020-12-12 ENCOUNTER — Telehealth: Payer: Self-pay | Admitting: Endocrinology

## 2020-12-12 DIAGNOSIS — E785 Hyperlipidemia, unspecified: Secondary | ICD-10-CM | POA: Diagnosis not present

## 2020-12-12 DIAGNOSIS — N189 Chronic kidney disease, unspecified: Secondary | ICD-10-CM | POA: Diagnosis not present

## 2020-12-12 DIAGNOSIS — E119 Type 2 diabetes mellitus without complications: Secondary | ICD-10-CM | POA: Diagnosis not present

## 2020-12-12 DIAGNOSIS — M5416 Radiculopathy, lumbar region: Secondary | ICD-10-CM | POA: Diagnosis not present

## 2020-12-12 DIAGNOSIS — I208 Other forms of angina pectoris: Secondary | ICD-10-CM | POA: Diagnosis not present

## 2020-12-12 DIAGNOSIS — N179 Acute kidney failure, unspecified: Secondary | ICD-10-CM | POA: Diagnosis not present

## 2020-12-12 DIAGNOSIS — I10 Essential (primary) hypertension: Secondary | ICD-10-CM | POA: Diagnosis not present

## 2020-12-12 NOTE — Telephone Encounter (Signed)
Pt stated called [facility] pt assistant --stated missing forms. Re-sent complete forms [phone]

## 2020-12-12 NOTE — Telephone Encounter (Signed)
Patient called asking [name] to give her a call back please. Did not specify why. Ph# [phone]

## 2020-12-13 ENCOUNTER — Telehealth: Payer: Self-pay | Admitting: Internal Medicine

## 2020-12-13 ENCOUNTER — Ambulatory Visit: Payer: Medicare Other | Admitting: Endocrinology

## 2020-12-13 NOTE — Telephone Encounter (Signed)
Katie w/ [facility] called and said that the patient and her husband have refused nursing care and the patient will only have PT services. Please advise   Phone: [phone]

## 2020-12-13 NOTE — Telephone Encounter (Signed)
Ok for this? 

## 2020-12-21 ENCOUNTER — Other Ambulatory Visit: Payer: Self-pay | Admitting: *Deleted

## 2020-12-21 ENCOUNTER — Telehealth: Payer: Self-pay | Admitting: Endocrinology

## 2020-12-21 DIAGNOSIS — E1165 Type 2 diabetes mellitus with hyperglycemia: Secondary | ICD-10-CM

## 2020-12-21 MED ORDER — GLUCOSE BLOOD VI STRP: ORAL_STRIP | 12 refills | Status: AC

## 2020-12-21 NOTE — Telephone Encounter (Signed)
Pt stated--taking her Humalog -base on sliding scale and lantus 10 units. Pt have an appt tomorrow 11:30 woth Dr. Dwyane Dee.

## 2020-12-21 NOTE — Telephone Encounter (Signed)
Pt stated have not been using her freestyle libre and meter due to out of sensor and test strip. Notified pt to call us [facility] for refill-[phone] and sent the test strip at the pharmacy and D/C lantus per Dr. Dwyane Dee.

## 2020-12-21 NOTE — Telephone Encounter (Signed)
She will stop taking her Lantus, please tell her to make sure she is comparing her freestyle libre with the Accu-Chek meter and to come 15 minutes before her appointment for downloading

## 2020-12-21 NOTE — Telephone Encounter (Signed)
Patient calling because her fasting blood sugar was 41 and now after drinking OJ and eating it has only come up to 63.   [phone]

## 2020-12-22 ENCOUNTER — Ambulatory Visit (INDEPENDENT_AMBULATORY_CARE_PROVIDER_SITE_OTHER): Payer: Medicare Other | Admitting: Endocrinology

## 2020-12-22 ENCOUNTER — Other Ambulatory Visit: Payer: Self-pay

## 2020-12-22 DIAGNOSIS — Z794 Long term (current) use of insulin: Secondary | ICD-10-CM | POA: Diagnosis not present

## 2020-12-22 DIAGNOSIS — E1165 Type 2 diabetes mellitus with hyperglycemia: Secondary | ICD-10-CM | POA: Diagnosis not present

## 2020-12-22 DIAGNOSIS — E890 Postprocedural hypothyroidism: Secondary | ICD-10-CM | POA: Diagnosis not present

## 2020-12-22 LAB — BASIC METABOLIC PANEL
BUN: 18 mg/dL (ref 6–23)
CO2: 28 mEq/L (ref 19–32)
Calcium: 10 mg/dL (ref 8.4–10.5)
Chloride: 103 mEq/L (ref 96–112)
Creatinine, Ser: 1.46 mg/dL — ABNORMAL HIGH (ref 0.40–1.20)
GFR: 36.17 mL/min — ABNORMAL LOW (ref >60.00)
Glucose, Bld: 239 mg/dL — ABNORMAL HIGH (ref 70–99)
Potassium: 4.8 mEq/L (ref 3.5–5.1)
Sodium: 139 mEq/L (ref 135–145)

## 2020-12-22 LAB — TSH: TSH: 39.8 u[IU]/mL — ABNORMAL HIGH (ref 0.35–4.50)

## 2020-12-22 LAB — POCT GLYCOSYLATED HEMOGLOBIN (HGB A1C): Hemoglobin A1C: 7.8 % — AB (ref 4.0–5.6)

## 2020-12-22 LAB — GLUCOSE, POCT (MANUAL RESULT ENTRY): POC Glucose: 231 mg/dl — AB (ref 70–99)

## 2020-12-22 LAB — T4, FREE: Free T4: 0.49 ng/dL — ABNORMAL LOW (ref 0.60–1.60)

## 2020-12-22 NOTE — Patient Instructions (Addendum)
Thyroid 2 pills daily of the 75 mcg every morning until further instructions  Stop LANTUS for now  70/30 insulin to be taken 3 times a day as follows  Take 10 units on waking up around 9 AM. Take another 10 units before eating at 1 PM Take the third dose of 10 units about 30 minutes before you planning to eat your meal, no later than 8:30 PM   Make sure you drink plenty of fluids

## 2020-12-22 NOTE — Progress Notes (Signed)
[name] is a 70 y.o. female.    Reason for Appointment: follow-up   History of Present Illness   Diagnosis: Type 2 DIABETES MELITUS, date of diagnosis: 2000  Previous history: She had initially been treated with metformin and Amaryl and subsequently given Victoza which helped overall control and weight loss. In 9/13 because of significant hyperglycemia she was started on basal bolus insulin also. Had required relatively small doses of insulin. Her blood sugar control has been excellent with A1c between 5.3-6.0 although this may be relatively higher than expected. Tends to have relatively higher readings after supper In 6/15 she was told to resume her Victoza and stop her Lantus since she had been gaining weight with Lantus and NovoLog regimen.   Recent history:     INSULIN regimen: Lantus 10 units before breakfast, Novolin 70/30, 10-10 units     Non-insulin hypoglycemic drugs: None   A1c is 7.8 compared to 8.1  Fructosamine previously 452  Current blood sugar patterns and management:  She did not come back for her follow-up since 12/21  Again she is not following instructions for insulin doses  She is taking her 70/30 insulin reportedly twice a day although not clear if she is taking it before meals as directed  She says she does not eat anything in the morning until about 1 PM  She was told at the hospital to take her insulin based on sliding scale  Also checking blood sugars somewhat infrequently  This week she had episodes of low blood sugar early morning twice but otherwise her overnight blood sugars are high indicating that she likely took her 70/30 insulin at bedtime instead of suppertime and may have taken a larger dose  She still has not been able to get Trulicity from her patient assistance program  [name] was stopped previously because of low basal insulin requirement and nausea    Side effects from medications:  none   Glucometer:  Freestyle  Libre  CGM data  interpretation as follows   She did not compare her [name] to the first accident  Blood sugar monitoring has been incomplete and inconsistent especially in the evenings  There is no data from 140s over the last 2 weeks  High degree of lability present especially overnight  OVERNIGHT blood sugars are mostly high 20s showing decline in blood sugars into the morning including hypoglycemia morning of 17th and low normal on the 16th  POSTPRANDIAL blood sugars are hard to judge since she does not check reading usually after dinner, occasionally appears to have a relatively high reading late at night at bedtime  Blood sugars in the afternoons are variable with increased on some days  Average glucose is higher than on her last visit  Hypoglycemia documented only significantly on the 17th early morning  CGM use % of time  39%  2-week average/GV  217  Time in range       17%  % Time Above 180  49  % Time above 250  31  % Time Below 70  3     PRE-MEAL Fasting Lunch Dinner Bedtime Overall  Glucose range:       Averages:  190  231  247  252    POST-MEAL PC Breakfast PC Lunch PC Dinner  Glucose range:     Averages:   241  268    PREVIOUSLY:  CGM use % of time   2-week average/GV  196/43  Time in range  40%  % Time Above 180  23  % Time above 250  30  % Time Below 70 7     PRE-MEAL Fasting Lunch Dinner Bedtime Overall  Glucose range:       Averages:  141   256  238  196   POST-MEAL PC Breakfast PC Lunch PC Dinner  Glucose range:     Averages:  218   267     Meals: 2 meals per day. Lunch 12 Supper at 6-7 PM            Physical activity: exercise: Limited, has had fatigue, back and leg pain.            Dietician visit: Most recent: [id number]            Complications: are: None  Wt Readings from Last 3 Encounters:  12/08/20 289 lb (131.1 kg)  11/24/20 (!) 306 lb (138.8 kg)  11/09/20 298 lb 15.1 oz (135.6 kg)    LABS:  Lab Results  Component  Value Date   HGBA1C 7.8 (A) 12/22/2020   HGBA1C 8.1 (A) 10/03/2020   HGBA1C 9.4 (H) 07/06/2020   Lab Results  Component Value Date   MICROALBUR 1.5 07/06/2020   LDLCALC 105 (H) 01/26/2020   CREATININE 2.27 (H) 12/08/2020   Lab Results  Component Value Date   FRUCTOSAMINE 452 (H) 01/25/2020   FRUCTOSAMINE 401 (H) 10/20/2019   FRUCTOSAMINE 369 (H) 09/08/2019     Other active problems: See review of systems   Office Visit on 12/22/2020  Component Date Value Ref Range Status  . Hemoglobin A1C 12/22/2020 7.8* 4.0 - 5.6 % Final  . POC Glucose 12/22/2020 231* 70 - 99 mg/dl Final    Allergies as of 12/22/2020      Reactions   Ciprofloxacin Shortness Of Breath, Other (See Comments)   Dizziness   Hydrocodone Shortness Of Breath, Other (See Comments)   Tachycardia   Hydrocortisone    Penicillins Hives   Pt just remembers hives Has patient had a PCN reaction causing immediate rash, facial/tongue/throat swelling, SOB or lightheadedness with hypotension: No Has patient had a PCN reaction causing severe rash involving mucus membranes or skin necrosis: No Has patient had a PCN reaction that required hospitalization No Has patient had a PCN reaction occurring within the last 10 years: No If all of the above answers are "NO", then may proceed with Cephalosporin use.      Medication List       Accurate as of December 22, 2020  1:35 PM. If you have any questions, ask your nurse or doctor.        aspirin 81 MG EC tablet Take 81 mg by mouth daily.   atorvastatin 40 MG tablet Commonly known as: LIPITOR TAKE ONE TABLET BY MOUTH EVERY NIGHT AT BEDTIME   azithromycin 250 MG tablet Commonly known as: ZITHROMAX 2 tab by mouth day 1, then 1 per day   chlorhexidine 0.12 % solution Commonly known as: PERIDEX as directed.   cloNIDine 0.1 MG tablet Commonly known as: CATAPRES TAKE ONE TABLET BY MOUTH TWICE A DAY   doxycycline 100 MG tablet Commonly known as: VIBRA-TABS Take 1  tablet (100 mg total) by mouth 2 (two) times daily.   Elmiron 100 MG capsule Generic drug: pentosan polysulfate Take 200 mg by mouth 2 (two) times daily.   FLUoxetine 20 MG capsule Commonly known as: PROZAC TAKE ONE CAPSULE BY MOUTH DAILY   FreeStyle Libre 2 Reader [facility] Use  as directed twice daily E11.9   FreeStyle Libre 2 Sensor Misc Use as directed once bi-weekly E11.9   gabapentin 600 MG tablet Commonly known as: NEURONTIN Take 600 mg by mouth See admin instructions. 1 tab in the am and at noon. 2 hs   glucose blood test strip Commonly known as: FREESTYLE TEST STRIPS Use as instructed to check blood sugar 4 times daily.   glucose blood test strip Use as instructed   insulin lispro 100 UNIT/ML injection Commonly known as: HUMALOG Inject 3-20 Units into the skin See admin instructions. Per sliding scale 3 times daily   isosorbide mononitrate 60 MG 24 hr tablet Commonly known as: IMDUR Take 1 tablet (60 mg total) by mouth daily.   Lantus SoloStar 100 UNIT/ML Solostar Pen Generic drug: insulin glargine Inject 18 Units into the skin daily. What changed: how much to take   levothyroxine 75 MCG tablet Commonly known as: SYNTHROID Take 3 tablets (225 mcg total) by mouth daily at 6 (six) AM.   linaclotide 72 MCG capsule Commonly known as: Linzess Take 1 capsule (72 mcg total) by mouth daily before breakfast.   losartan-hydrochlorothiazide 100-25 MG tablet Commonly known as: HYZAAR Take 1 tablet by mouth daily.   metoprolol succinate 50 MG 24 hr tablet Commonly known as: TOPROL-XL TAKE 1 TABLET BY MOUTH DAILY WITH OR IMMEDIATELY FOLLOWING A MEAL   nitroGLYCERIN 0.4 MG SL tablet Commonly known as: NITROSTAT Place 1 tablet (0.4 mg total) under the tongue every 5 (five) minutes as needed for chest pain.   ondansetron 4 MG tablet Commonly known as: Zofran Take 1 tablet (4 mg total) by mouth every 8 (eight) hours as needed for nausea or vomiting.   pantoprazole 40  MG tablet Commonly known as: PROTONIX TAKE 1 TABLET(40 MG) BY MOUTH TWICE DAILY   polyethylene glycol 17 g packet Commonly known as: MIRALAX / GLYCOLAX Take 17 g by mouth daily as needed.   traMADol 50 MG tablet Commonly known as: ULTRAM Take 1 tablet (50 mg total) by mouth every 6 (six) hours as needed.       Allergies:  Allergies  Allergen Reactions  . Ciprofloxacin Shortness Of Breath and Other (See Comments)    Dizziness   . Hydrocodone Shortness Of Breath and Other (See Comments)    Tachycardia  . Hydrocortisone   . Penicillins Hives    Pt just remembers hives Has patient had a PCN reaction causing immediate rash, facial/tongue/throat swelling, SOB or lightheadedness with hypotension: No Has patient had a PCN reaction causing severe rash involving mucus membranes or skin necrosis: No Has patient had a PCN reaction that required hospitalization No Has patient had a PCN reaction occurring within the last 10 years: No If all of the above answers are "NO", then may proceed with Cephalosporin use.     Past Medical History:  Diagnosis Date  . Anxiety   . Arthritis   . Blood dyscrasia    "FREE BLEEDER"  . CERVICAL RADICULOPATHY, LEFT   . Chronic back pain   . CKD (chronic kidney disease)    DR. SANFORD  [facility]  . COMMON MIGRAINE   . CORONARY ARTERY DISEASE   . Cough    CURRENT COLD  . Cystitis   . DEPRESSION   . Diabetes mellitus, type II ([place])   . DIVERTICULOSIS-COLON   . Dysrhythmia    palpitations  . Gastric ulcer 04/2008  . Gastroparesis   . GERD (gastroesophageal reflux disease)   . Hiatal  hernia   . Hyperlipidemia   . Hypertension   . Hypothyroidism   . INSOMNIA-SLEEP DISORDER-UNSPEC   . Iron deficiency anemia   . Wears glasses     Past Surgical History:  Procedure Laterality Date  . ABDOMINAL HYSTERECTOMY    . ANTERIOR LAT LUMBAR FUSION Left 08/13/2017   Procedure: LEFT SIDED LUMBAR 3-4 LATERAL INTERBODY FUSION WITH INSTRUMENTATION AND  ALLOGRAFT;  Surgeon: Phylliss Bob, MD;  Location: [facility];  Service: Orthopedics;  Laterality: Left;  LEFT SIDED LUMBAR 3-4 LATERAL INTERBODY FUSION WITH INSTRUMENTATION AND ALLOGRAFT; REQUEST 3 HOURS  . Back Stimulator  07/2018  . BACK SURGERY  03/2016  . BREAST EXCISIONAL BIOPSY Right    40 years ago  . CHOLECYSTECTOMY  06/2009  . COLONOSCOPY    . ESOPHAGOGASTRODUODENOSCOPY    . EYE SURGERY Bilateral    lasik  . Gastric Wedge resection lipoma  11/2007   x2 with laparotomy and gastrostomy  . JOINT REPLACEMENT    . Left knee replacement    . Rigth knee replacement with revision  04/2008   Dr. Berenice Primas  . ROTATOR CUFF REPAIR Left 01/2009  . s/p bladder surgury  09/2009   Dr. Terance Hart  . SPINAL CORD STIMULATOR INSERTION N/A 09/11/2018   Procedure: LUMBAR SPINAL CORD STIMULATOR INSERTION;  Surgeon: Clydell Hakim, MD;  Location: [facility];  Service: Neurosurgery;  Laterality: N/A;  LUMBAR SPINAL CORD STIMULATOR INSERTION    Family History  Problem Relation Age of Onset  . Diabetes Sister        x 3  . Heart disease Sister        x2  . Breast cancer Sister 85  . Diabetes Brother        x3  . Heart disease Brother        x2  . Coronary artery disease Other        female 1st degree  . Hypertension Other   . Breast cancer Sister 37  . Breast cancer Other 25  . Diabetes Brother   . Colon cancer Neg Hx     Social History:  reports that she has quit smoking. She has never used smokeless tobacco. She reports that she does not drink alcohol and does not use drugs.  Review of Systems:  Hypertension: Her blood pressure is treated with losartan 100 mg and clonidine 0.1 mg tablets, also on metoprolol  Followed by nephrologist and PCP  BP Readings from Last 3 Encounters:  12/08/20 118/60  11/24/20 132/80  11/11/20 (!) 172/83     CKD: Her creatinine is variable, followed by nephrologist Creatinine is higher as of 3/22  Lab Results  Component Value Date   CREATININE 2.27 (H) 12/08/2020    CREATININE 2.78 (H) 11/24/2020   CREATININE 1.51 (H) 11/11/2020   CREATININE 1.63 (H) 11/10/2020   Lab Results  Component Value Date   K 4.4 12/08/2020     Lipids: Followed by PCP, LDL has been variably controlled  Taking Lipitor 40 mg  Her lipids are much worse, does also have significant hypothyroidism on this visit  Lab Results  Component Value Date   CHOL 175 01/26/2020   HDL 56 01/26/2020   LDLCALC 105 (H) 01/26/2020   LDLDIRECT 87.0 01/25/2020   TRIG 71 01/26/2020   CHOLHDL 3.1 01/26/2020     Post ablative hypothyroidism:  Previously she had I-131 treatment on 08/20/11 for Graves' disease  Her dose has been adjusted periodically  She has been taking 125 mg levothyroxine prescription  consistently and has been taking 1-1/2 on Saturdays and 1 pill on the other days   TSH was low normal previously and sugars told to reduce her regimen by half tablet weekly However for some reason her TSH was much higher in March and she was significantly symptomatic with fatigue Also was having some vomiting at that time  Now she is taking 1-1/2 tablets daily of the 125 mcg dose and has taken this consistently the mornings  Lab Results  Component Value Date   TSH 45.164 (H) 11/09/2020   TSH 54.19 (H) 10/03/2020   TSH 4.21 07/06/2020   FREET4 0.51 (L) 11/11/2020   FREET4 <0.25 (L) 10/03/2020   FREET4 0.63 07/06/2020    On Gabapentin 300 mg for neuropathy    Examination:   There were no vitals taken for this visit.  There is no height or weight on file to calculate BMI.   No ankle edema evident   ASSESSMENT/ PLAN:   Diabetes type 2,  with obesity:   See history of present illness for detailed discussion of current management, blood sugar patterns and problems identified  A1C is recently 9%  Even though she had appeared to require a lot of insulin on her last visit and she was recommended a total of 90 units daily her blood sugars are much lower in the last few  days This is despite taking only a total of 40 units of insulin a day May be benefiting from using premixed insulin instead of only basal and bolus  Her blood sugars still tend to be highest midday and afternoon but this is partly related to her not taking her premixed insulin before eating Fasting readings are still fairly good with 20 units of Lantus despite not using [place] She may have had some nausea with [place] which is resolving  Recommendations for diabetes:  She was advised to check blood sugars consistently at all different time Emphasized the need to take her premixed insulin well before starting to eat and she needs to tell her husband to remind her to do so; also advised her that late administration of her premixed insulin may cause nocturnal hypoglycemia She will also need to reduce Lantus to 18 units to prevent overnight hypoglycemia Blood sugar targets were discussed She will try to avoid high fat or high carbohydrate meals  Hypothyroidism: Taking 125 mcg, 1-1/2 tablets daily because of very high TSH Not clear if she was having some vomiting problem gastroparesis causing inadequate absorption Labs need to be checked again  Hypertension/renal insufficiency: Losartan was stopped and needs follow-up labs Blood pressure is not as low    Patient Instructions  Thyroid 2 pills daily of the 75 mcg every morning until further instructions  Stop LANTUS for now  70/30 insulin to be taken 3 times a day as follows  Take 10 units on waking up around 9 AM. Take another 10 units before eating at 1 PM Take the third dose of 10 units about 30 minutes before you planning to eat your meal, no later than 8:30 PM   Make sure you drink plenty of fluids      Elayne Snare 12/22/2020, 1:35 PM   Note: This office note was prepared with Dragon voice recognition system technology. Any transcriptional errors that result from this process are  unintentional.  [name] is a 70 y.o. female.    Reason for Appointment: follow-up   History of Present Illness   Diagnosis: Type 2 DIABETES MELITUS, date of diagnosis: 2000  Previous history: She had initially been treated with metformin and Amaryl and subsequently given Victoza which helped overall control and weight loss. In 9/13 because of significant hyperglycemia she was started on basal bolus insulin also. Had required relatively small doses of insulin. Her blood sugar control has been excellent with A1c between 5.3-6.0 although this may be relatively higher than expected. Tends to have relatively higher readings after supper In 6/15 she was told to resume her Victoza and stop her Lantus since she had been gaining weight with Lantus and NovoLog regimen.   Recent history:     INSULIN regimen: Lantus 20 units before Bfst, Novolin mix 10 bid   Non-insulin hypoglycemic drugs: None   Her A1c was last 9% No recent fructosamine available  Current blood sugar patterns and management:  She has had much more variable blood sugars over the last month  She had been asked to increase her basal insulin to 30 units but more recently she started getting low and is now taking 20 units  Previously had a sample of [place] and now starting Lantus in the morning  She was also recommended Humalog mix insulin but because of cost is now taking OTC Novolin 70/30  She does not always take her premixed insulin before meals causing some significantly high readings after her first meal and occasionally in the evening  Last night after taking her premixed insulin 2 hours after eating her blood sugar got low in the night  However she is checking her blood sugars much more consistently than before with her freestyle libre  Not clear if this is accurate since she does not correlate this with fingersticks    Side effects  from medications:  none   Glucometer:  Freestyle Libre/Accu-Chek   CONTINUOUS GLUCOSE MONITORING RECORD INTERPRETATION    Dates of Recording: 4/7 through 4/20  Sensor description: [name]  Results statistics:   CGM use % of time  88  Average and SD  172 GV 45  Time in range    50    %  % Time Above 180  23  % Time above 250  17  % Time Below target  10    PRE-MEAL Fasting Lunch Dinner Bedtime Overall  Glucose range:       Mean/median:  145   167   172   POST-MEAL PC Breakfast PC Lunch PC Dinner  Glucose range:     Mean/median:  217   176    Glycemic patterns summary: Blood sugars show moderate variability at all times with the highest readings early afternoon between 12 noon-4 PM but not consistent As discussed below she has occasional low episodes also  Hyperglycemic episodes have occurred occasionally overnight, periodically midday or early afternoon and a couple of times late evening  Hypoglycemic episodes occurred overnight on 2 nights has also once before 6 PM and another time after 8 PM  Overnight periods: Blood sugars are quite variable but in the last 4 nights are mostly near normal or slightly low  Preprandial periods: Mealtimes are inconsistent and difficult to identify patterns  Postprandial periods:   Blood sugars are generally going up significantly around midday after her first meal but only occasionally late evening   Previous data:      CGM use % of time  44  2-week average/SD  204, GV 35  Time in range  34     %, was 51  % Time Above 180  33  % Time above 250  29  % Time Below 70 4     PRE-MEAL Fasting Lunch Dinner Bedtime Overall  Glucose range:    224    Averages:  163     204   POST-MEAL PC Breakfast PC Lunch PC Dinner  Glucose range:     Averages:  213   276   Meals: 1-2 meals per day. Lunch 12 Supper at 5 PM            Physical activity: exercise: Limited, has had fatigue, back and leg pain.            Dietician visit: Most  recent: [id number]            Complications: are: None  Wt Readings from Last 3 Encounters:  12/08/20 289 lb (131.1 kg)  11/24/20 (!) 306 lb (138.8 kg)  11/09/20 298 lb 15.1 oz (135.6 kg)    LABS:  Lab Results  Component Value Date   HGBA1C 7.8 (A) 12/22/2020   HGBA1C 8.1 (A) 10/03/2020   HGBA1C 9.4 (H) 07/06/2020   Lab Results  Component Value Date   MICROALBUR 1.5 07/06/2020   LDLCALC 105 (H) 01/26/2020   CREATININE 2.27 (H) 12/08/2020   Lab Results  Component Value Date   FRUCTOSAMINE 452 (H) 01/25/2020   FRUCTOSAMINE 401 (H) 10/20/2019   FRUCTOSAMINE 369 (H) 09/08/2019     Other active problems: See review of systems   Office Visit on 12/22/2020  Component Date Value Ref Range Status  . Hemoglobin A1C 12/22/2020 7.8* 4.0 - 5.6 % Final  . POC Glucose 12/22/2020 231* 70 - 99 mg/dl Final    Allergies as of 12/22/2020      Reactions   Ciprofloxacin Shortness Of Breath, Other (See Comments)   Dizziness   Hydrocodone Shortness Of Breath, Other (See Comments)   Tachycardia   Hydrocortisone    Penicillins Hives   Pt just remembers hives Has patient had a PCN reaction causing immediate rash, facial/tongue/throat swelling, SOB or lightheadedness with hypotension: No Has patient had a PCN reaction causing severe rash involving mucus membranes or skin necrosis: No Has patient had a PCN reaction that required hospitalization No Has patient had a PCN reaction occurring within the last 10 years: No If all of the above answers are "NO", then may proceed with Cephalosporin use.      Medication List       Accurate as of December 22, 2020  1:35 PM. If you have any questions, ask your nurse or doctor.        aspirin 81 MG EC tablet Take 81 mg by mouth daily.   atorvastatin 40 MG tablet Commonly known as: LIPITOR TAKE ONE TABLET BY MOUTH EVERY NIGHT AT BEDTIME   azithromycin 250 MG tablet Commonly known as: ZITHROMAX 2 tab by mouth day 1, then 1 per day    chlorhexidine 0.12 % solution Commonly known as: PERIDEX as directed.   cloNIDine 0.1 MG tablet Commonly known as: CATAPRES TAKE ONE TABLET BY MOUTH TWICE A DAY   doxycycline 100 MG tablet Commonly known as: VIBRA-TABS Take 1 tablet (100 mg total) by mouth 2 (two) times daily.   Elmiron 100 MG capsule Generic drug: pentosan polysulfate Take 200 mg by mouth 2 (two) times daily.   FLUoxetine 20 MG capsule Commonly  known as: PROZAC TAKE ONE CAPSULE BY MOUTH DAILY   FreeStyle Libre 2 Reader [facility] Use as directed twice daily E11.9   FreeStyle Libre 2 Sensor Misc Use as directed once bi-weekly E11.9   gabapentin 600 MG tablet Commonly known as: NEURONTIN Take 600 mg by mouth See admin instructions. 1 tab in the am and at noon. 2 hs   glucose blood test strip Commonly known as: FREESTYLE TEST STRIPS Use as instructed to check blood sugar 4 times daily.   glucose blood test strip Use as instructed   insulin lispro 100 UNIT/ML injection Commonly known as: HUMALOG Inject 3-20 Units into the skin See admin instructions. Per sliding scale 3 times daily   isosorbide mononitrate 60 MG 24 hr tablet Commonly known as: IMDUR Take 1 tablet (60 mg total) by mouth daily.   Lantus SoloStar 100 UNIT/ML Solostar Pen Generic drug: insulin glargine Inject 18 Units into the skin daily. What changed: how much to take   levothyroxine 75 MCG tablet Commonly known as: SYNTHROID Take 3 tablets (225 mcg total) by mouth daily at 6 (six) AM.   linaclotide 72 MCG capsule Commonly known as: Linzess Take 1 capsule (72 mcg total) by mouth daily before breakfast.   losartan-hydrochlorothiazide 100-25 MG tablet Commonly known as: HYZAAR Take 1 tablet by mouth daily.   metoprolol succinate 50 MG 24 hr tablet Commonly known as: TOPROL-XL TAKE 1 TABLET BY MOUTH DAILY WITH OR IMMEDIATELY FOLLOWING A MEAL   nitroGLYCERIN 0.4 MG SL tablet Commonly known as: NITROSTAT Place 1 tablet (0.4 mg  total) under the tongue every 5 (five) minutes as needed for chest pain.   ondansetron 4 MG tablet Commonly known as: Zofran Take 1 tablet (4 mg total) by mouth every 8 (eight) hours as needed for nausea or vomiting.   pantoprazole 40 MG tablet Commonly known as: PROTONIX TAKE 1 TABLET(40 MG) BY MOUTH TWICE DAILY   polyethylene glycol 17 g packet Commonly known as: MIRALAX / GLYCOLAX Take 17 g by mouth daily as needed.   traMADol 50 MG tablet Commonly known as: ULTRAM Take 1 tablet (50 mg total) by mouth every 6 (six) hours as needed.       Allergies:  Allergies  Allergen Reactions  . Ciprofloxacin Shortness Of Breath and Other (See Comments)    Dizziness   . Hydrocodone Shortness Of Breath and Other (See Comments)    Tachycardia  . Hydrocortisone   . Penicillins Hives    Pt just remembers hives Has patient had a PCN reaction causing immediate rash, facial/tongue/throat swelling, SOB or lightheadedness with hypotension: No Has patient had a PCN reaction causing severe rash involving mucus membranes or skin necrosis: No Has patient had a PCN reaction that required hospitalization No Has patient had a PCN reaction occurring within the last 10 years: No If all of the above answers are "NO", then may proceed with Cephalosporin use.     Past Medical History:  Diagnosis Date  . Anxiety   . Arthritis   . Blood dyscrasia    "FREE BLEEDER"  . CERVICAL RADICULOPATHY, LEFT   . Chronic back pain   . CKD (chronic kidney disease)    DR. SANFORD  [facility]  . COMMON MIGRAINE   . CORONARY ARTERY DISEASE   . Cough    CURRENT COLD  . Cystitis   . DEPRESSION   . Diabetes mellitus, type II ([place])   . DIVERTICULOSIS-COLON   . Dysrhythmia    palpitations  .  Gastric ulcer 04/2008  . Gastroparesis   . GERD (gastroesophageal reflux disease)   . Hiatal hernia   . Hyperlipidemia   . Hypertension   . Hypothyroidism   . INSOMNIA-SLEEP DISORDER-UNSPEC   . Iron deficiency  anemia   . Wears glasses     Past Surgical History:  Procedure Laterality Date  . ABDOMINAL HYSTERECTOMY    . ANTERIOR LAT LUMBAR FUSION Left 08/13/2017   Procedure: LEFT SIDED LUMBAR 3-4 LATERAL INTERBODY FUSION WITH INSTRUMENTATION AND ALLOGRAFT;  Surgeon: Phylliss Bob, MD;  Location: [place];  Service: Orthopedics;  Laterality: Left;  LEFT SIDED LUMBAR 3-4 LATERAL INTERBODY FUSION WITH INSTRUMENTATION AND ALLOGRAFT; REQUEST 3 HOURS  . Back Stimulator  07/2018  . BACK SURGERY  03/2016  . BREAST EXCISIONAL BIOPSY Right    40 years ago  . CHOLECYSTECTOMY  06/2009  . COLONOSCOPY    . ESOPHAGOGASTRODUODENOSCOPY    . EYE SURGERY Bilateral    lasik  . Gastric Wedge resection lipoma  11/2007   x2 with laparotomy and gastrostomy  . JOINT REPLACEMENT    . Left knee replacement    . Rigth knee replacement with revision  04/2008   Dr. Berenice Primas  . ROTATOR CUFF REPAIR Left 01/2009  . s/p bladder surgury  09/2009   Dr. Terance Hart  . SPINAL CORD STIMULATOR INSERTION N/A 09/11/2018   Procedure: LUMBAR SPINAL CORD STIMULATOR INSERTION;  Surgeon: Clydell Hakim, MD;  Location: [place];  Service: Neurosurgery;  Laterality: N/A;  LUMBAR SPINAL CORD STIMULATOR INSERTION    Family History  Problem Relation Age of Onset  . Diabetes Sister        x 3  . Heart disease Sister        x2  . Breast cancer Sister 31  . Diabetes Brother        x3  . Heart disease Brother        x2  . Coronary artery disease Other        female 1st degree  . Hypertension Other   . Breast cancer Sister 35  . Breast cancer Other 25  . Diabetes Brother   . Colon cancer Neg Hx     Social History:  reports that she has quit smoking. She has never used smokeless tobacco. She reports that she does not drink alcohol and does not use drugs.  Review of Systems:  Hypertension: Her blood pressure is treated with losartan 100 mg and clonidine 0.1 mg tablets, also on metoprolol  Followed by nephrologist and PCP  BP Readings from  Last 3 Encounters:  12/08/20 118/60  11/24/20 132/80  11/11/20 (!) 172/83     CKD: Her creatinine is variable, followed by nephrologist Recent labs:  Lab Results  Component Value Date   CREATININE 2.27 (H) 12/08/2020   CREATININE 2.78 (H) 11/24/2020   CREATININE 1.51 (H) 11/11/2020   CREATININE 1.63 (H) 11/10/2020    Lipids: Followed by PCP, LDL has been variably controlled  Taking Lipitor 40 mg  LDL not consistently below 100  Lab Results  Component Value Date   CHOL 175 01/26/2020   HDL 56 01/26/2020   LDLCALC 105 (H) 01/26/2020   LDLDIRECT 87.0 01/25/2020   TRIG 71 01/26/2020   CHOLHDL 3.1 01/26/2020     Post ablative hypothyroidism:  Previously she had I-131 treatment on 08/20/11 for Graves' disease  Her dose has been adjusted periodically  She was switched to 225 mcg of levothyroxine when she was admitted in February but she did  not let us know this She was told to take extra half tablet 3 days a week of the 125 mcg when she last called but she misunderstood and is only taking a half of the 75 mcg tablets  No recent labs available   Lab Results  Component Value Date   TSH 45.164 (H) 11/09/2020   TSH 54.19 (H) 10/03/2020   TSH 4.21 07/06/2020   FREET4 0.51 (L) 11/11/2020   FREET4 <0.25 (L) 10/03/2020   FREET4 0.63 07/06/2020    On Gabapentin 300 mg for neuropathy  Asking about dry mouth    Examination:   There were no vitals taken for this visit.  There is no height or weight on file to calculate BMI.      ASSESSMENT/ PLAN:   Diabetes type 2,  with obesity:   See history of present illness for detailed discussion of current management, blood sugar patterns and problems identified  A1C is 7.8  Her blood sugars are poorly controlled again Although she may do better with basal bolus insulin regimen she has high variability in her blood sugars and this is likely to be from variable compliance with the timing of her insulin For simplicity  she is taking premixed insulin but likely not taking this at consistent times especially in the morning when she is skipping meals Also in the evening likely may take it at bedtime instead of suppertime causing overnight hypoglycemia Glucose monitoring is inadequate   Recommendations for diabetes:  She will need to take the Novolin brand 70/30 insulin split into 3 doses now  She will take 10 units on waking up, and also before each meal but preferably at fixed times at 12:30 PM and 8:30 PM  Given written instructions on her insulin doses and timing  She will stop Lantus for now  She will start Trulicity when available from the patient assistance program at 0.75 mg weekly  Consistent glucose monitoring 4 times a day  Follow-up in 6 weeks  Hypothyroidism: TSH will need to be checked today  Total visit time =30 minutes including counseling regarding insulin, monitoring, blood sugar targets She will need to call if feeling unusually high or low sugars   Patient Instructions  Thyroid 2 pills daily of the 75 mcg every morning until further instructions  Stop LANTUS for now  70/30 insulin to be taken 3 times a day as follows  Take 10 units on waking up around 9 AM. Take another 10 units before eating at 1 PM Take the third dose of 10 units about 30 minutes before you planning to eat your meal, no later than 8:30 PM   Make sure you drink plenty of fluids      Elayne Snare 12/22/2020, 1:35 PM   Note: This office note was prepared with Dragon voice recognition system technology. Any transcriptional errors that result from this process are unintentional.

## 2020-12-25 ENCOUNTER — Telehealth: Payer: Self-pay

## 2020-12-25 NOTE — Telephone Encounter (Signed)
Called and advised pt of lab results. Pt advised patient assistance medication was received from [facility] and they do not have any instructions for the following medications: Trulicity  Humalog [id number] Basaglar Pt advised last instructions received was for Humalog 70/30 and wanted to make sure the instructions were the same. Please advise.

## 2020-12-25 NOTE — Telephone Encounter (Signed)
For now she will use the 75/25 Humalog mix 10 units 3 times daily as with the 0000000 insulin and Trulicity will be once a week.  Please check on the dose of Trulicity.  She will hold onto the [facility] for now

## 2020-12-25 NOTE — Telephone Encounter (Signed)
-----   Message from Elayne Snare, MD sent at 12/25/2020  3:41 PM EDT ----- Thyroid level is low, please have her take 1-1/2 tablets of the 75 mcg instead of 2 as directed last week.  Kidney test better

## 2020-12-26 ENCOUNTER — Ambulatory Visit: Payer: Medicare Other

## 2020-12-26 ENCOUNTER — Telehealth: Payer: Self-pay

## 2020-12-26 NOTE — Telephone Encounter (Signed)
Pt reports her Trulicity is 1.5 mg dose. Scheduled a nurse visit to get education to administer medication.

## 2020-12-26 NOTE — Telephone Encounter (Signed)
She will need to start with 0.75 mg weekly.  She can request this from the patient assistance program unless she we have samples for 2 weeks.  She will then go to 1.5 only if she has no nausea with 0.75 mg

## 2020-12-28 NOTE — Telephone Encounter (Signed)
instructed per Dr. Dwyane Dee to take 1 1/2 tablets of her thryoid medication: 13mq, instructed to return call if any questions/concerns

## 2020-12-29 NOTE — Telephone Encounter (Signed)
Patient called to try and come in 12/29/20 for a nurse visit - see previous notes.  Was scheduled for this on 12/26/20 but did not come.  I attempted to offer a spot for next week, but patient said no and then "requested" a call back today from a nurse  Call back # [phone]

## 2020-12-29 NOTE — Telephone Encounter (Signed)
Pt received assistance medication Trulicicty and the pen is for 1.5 mg. Dr Dwyane Dee previously stated he wanted pt to start with 0.75 mg weekly. Should we advise pt to administer full 1.5 mg?

## 2020-12-30 NOTE — Telephone Encounter (Signed)
Since she has the 1.5 mg pens, let's start with that.

## 2021-01-01 ENCOUNTER — Ambulatory Visit: Payer: Medicare Other

## 2021-01-01 ENCOUNTER — Other Ambulatory Visit: Payer: Self-pay

## 2021-01-01 NOTE — Telephone Encounter (Signed)
Called and left a message for pt to call back to schedule nurse visit for injection education.

## 2021-01-09 ENCOUNTER — Encounter: Payer: Self-pay | Admitting: Internal Medicine

## 2021-01-09 ENCOUNTER — Ambulatory Visit (INDEPENDENT_AMBULATORY_CARE_PROVIDER_SITE_OTHER): Payer: Medicare Other | Admitting: Internal Medicine

## 2021-01-09 ENCOUNTER — Telehealth: Payer: Self-pay | Admitting: Internal Medicine

## 2021-01-09 ENCOUNTER — Other Ambulatory Visit: Payer: Self-pay

## 2021-01-09 VITALS — BP 148/82 | HR 82 | Temp 98.1°F | Ht 66.0 in | Wt 285.0 lb

## 2021-01-09 DIAGNOSIS — R1031 Right lower quadrant pain: Secondary | ICD-10-CM | POA: Diagnosis not present

## 2021-01-09 DIAGNOSIS — E119 Type 2 diabetes mellitus without complications: Secondary | ICD-10-CM | POA: Diagnosis not present

## 2021-01-09 DIAGNOSIS — E1159 Type 2 diabetes mellitus with other circulatory complications: Secondary | ICD-10-CM

## 2021-01-09 DIAGNOSIS — Z794 Long term (current) use of insulin: Secondary | ICD-10-CM

## 2021-01-09 DIAGNOSIS — N390 Urinary tract infection, site not specified: Secondary | ICD-10-CM

## 2021-01-09 DIAGNOSIS — I152 Hypertension secondary to endocrine disorders: Secondary | ICD-10-CM | POA: Diagnosis not present

## 2021-01-09 LAB — HEPATIC FUNCTION PANEL
ALT: 12 U/L (ref 0–35)
AST: 14 U/L (ref 0–37)
Albumin: 4.2 g/dL (ref 3.5–5.2)
Alkaline Phosphatase: 91 U/L (ref 39–117)
Bilirubin, Direct: 0.2 mg/dL (ref 0.0–0.3)
Total Bilirubin: 0.8 mg/dL (ref 0.2–1.2)
Total Protein: 7.2 g/dL (ref 6.0–8.3)

## 2021-01-09 LAB — CBC WITH DIFFERENTIAL/PLATELET
Basophils Absolute: 0 10*3/uL (ref 0.0–0.1)
Basophils Relative: 0.5 % (ref 0.0–3.0)
Eosinophils Absolute: 0.3 10*3/uL (ref 0.0–0.7)
Eosinophils Relative: 4.5 % (ref 0.0–5.0)
HCT: 32.9 % — ABNORMAL LOW (ref 36.0–46.0)
Hemoglobin: 10.8 g/dL — ABNORMAL LOW (ref 12.0–15.0)
Lymphocytes Relative: 29.2 % (ref 12.0–46.0)
Lymphs Abs: 2 10*3/uL (ref 0.7–4.0)
MCHC: 33 g/dL (ref 30.0–36.0)
MCV: 98.9 fl (ref 78.0–100.0)
Monocytes Absolute: 0.4 10*3/uL (ref 0.1–1.0)
Monocytes Relative: 5.2 % (ref 3.0–12.0)
Neutro Abs: 4.1 10*3/uL (ref 1.4–7.7)
Neutrophils Relative %: 60.6 % (ref 43.0–77.0)
Platelets: 222 10*3/uL (ref 150.0–400.0)
RBC: 3.32 Mil/uL — ABNORMAL LOW (ref 3.87–5.11)
RDW: 13.6 % (ref 11.5–15.5)
WBC: 6.8 10*3/uL (ref 4.0–10.5)

## 2021-01-09 LAB — BASIC METABOLIC PANEL
BUN: 15 mg/dL (ref 6–23)
CO2: 27 mEq/L (ref 19–32)
Calcium: 9.5 mg/dL (ref 8.4–10.5)
Chloride: 104 mEq/L (ref 96–112)
Creatinine, Ser: 1.47 mg/dL — ABNORMAL HIGH (ref 0.40–1.20)
GFR: 35.86 mL/min — ABNORMAL LOW (ref >60.00)
Glucose, Bld: 198 mg/dL — ABNORMAL HIGH (ref 70–99)
Potassium: 4.2 mEq/L (ref 3.5–5.1)
Sodium: 139 mEq/L (ref 135–145)

## 2021-01-09 LAB — LIPASE: Lipase: 29 U/L (ref 11.0–59.0)

## 2021-01-09 NOTE — Telephone Encounter (Signed)
Patient called and said that she is experiencing right lower abdominal pain that started yesterday. She said that she has no other symptoms than the pain. Transferred to [facility]

## 2021-01-09 NOTE — Patient Instructions (Signed)
Please continue all other medications as before, and refills have been done if requested.  Please have the pharmacy call with any other refills you may need.  Please continue your efforts at being more active, low cholesterol diet, and weight control.  Please keep your appointments with your specialists as you may have planned  Please go to the LAB at the blood drawing area for the tests to be done today, mostly to rule out another urinary tract infection, though constipation could still be the problem  You will be contacted by phone if any changes need to be made immediately.  Otherwise, you will receive a letter about your results with an explanation, but please check with MyChart first.  Please remember to sign up for MyChart if you have not done so, as this will be important to you in the future with finding out test results, communicating by private email, and scheduling acute appointments online when needed.

## 2021-01-09 NOTE — Progress Notes (Signed)
Patient ID: [name], female   DOB: [birth year], 70 y.o.   MRN: [id number]        Chief Complaint:  RLQ pain       HPI:  [name] is a 70 y.o. female here with RLQ pain for 2-3 days, with hx of UTI x 2 recently and wondering about a third UTI.  Denies urinary symptoms such as dysuria, frequency, urgency, hematuria or n/v, fever, chills, but does have right flank pain as well; overall pais mod to severe, intermittent, associated with generalized weakness, and constapatin last wk better with laxative, Denies worsening reflux, dysphagia, n/v, or blood but may have consipation as well.  Nothing else makes better or worse ecxept tramadol and tylenol last PM.  Pt denies chest pain, increased sob or doe, wheezing, orthopnea, PND, increased LE swelling, palpitations, dizziness or syncope.   Pt denies polydipsia, polyuria,  Denies new focal neuro s/s.        Wt Readings from Last 3 Encounters:  01/09/21 285 lb (129.3 kg)  12/08/20 289 lb (131.1 kg)  11/24/20 (!) 306 lb (138.8 kg)   BP Readings from Last 3 Encounters:  01/09/21 (!) 148/82  12/08/20 118/60  11/24/20 132/80         Past Medical History:  Diagnosis Date  . Anxiety   . Arthritis   . Blood dyscrasia    "FREE BLEEDER"  . CERVICAL RADICULOPATHY, LEFT   . Chronic back pain   . CKD (chronic kidney disease)    DR. SANFORD  [facility]  . COMMON MIGRAINE   . CORONARY ARTERY DISEASE   . Cough    CURRENT COLD  . Cystitis   . DEPRESSION   . Diabetes mellitus, type II ([place])   . DIVERTICULOSIS-COLON   . Dysrhythmia    palpitations  . Gastric ulcer 04/2008  . Gastroparesis   . GERD (gastroesophageal reflux disease)   . Hiatal hernia   . Hyperlipidemia   . Hypertension   . Hypothyroidism   . INSOMNIA-SLEEP DISORDER-UNSPEC   . Iron deficiency anemia   . Wears glasses    Past Surgical History:  Procedure Laterality Date  . ABDOMINAL HYSTERECTOMY    . ANTERIOR LAT LUMBAR FUSION Left 08/13/2017   Procedure: LEFT  SIDED LUMBAR 3-4 LATERAL INTERBODY FUSION WITH INSTRUMENTATION AND ALLOGRAFT;  Surgeon: Phylliss Bob, MD;  Location: [facility] [place];  Service: Orthopedics;  Laterality: Left;  LEFT SIDED LUMBAR 3-4 LATERAL INTERBODY FUSION WITH INSTRUMENTATION AND ALLOGRAFT; REQUEST 3 HOURS  . Back Stimulator  07/2018  . BACK SURGERY  03/2016  . BREAST EXCISIONAL BIOPSY Right    40 years ago  . CHOLECYSTECTOMY  06/2009  . COLONOSCOPY    . ESOPHAGOGASTRODUODENOSCOPY    . EYE SURGERY Bilateral    lasik  . Gastric Wedge resection lipoma  11/2007   x2 with laparotomy and gastrostomy  . JOINT REPLACEMENT    . Left knee replacement    . Rigth knee replacement with revision  04/2008   Dr. Berenice Primas  . ROTATOR CUFF REPAIR Left 01/2009  . s/p bladder surgury  09/2009   Dr. Terance Hart  . SPINAL CORD STIMULATOR INSERTION N/A 09/11/2018   Procedure: LUMBAR SPINAL CORD STIMULATOR INSERTION;  Surgeon: Clydell Hakim, MD;  Location: [place];  Service: Neurosurgery;  Laterality: N/A;  LUMBAR SPINAL CORD STIMULATOR INSERTION    reports that she has quit smoking. She has never used smokeless tobacco. She reports that she does not drink alcohol and does not  use drugs. family history includes Breast cancer (age of onset: 14) in an other family member; Breast cancer (age of onset: 54) in her sister; Breast cancer (age of onset: 81) in her sister; Coronary artery disease in an other family member; Diabetes in her brother, brother, and sister; Heart disease in her brother and sister; Hypertension in an other family member. Allergies  Allergen Reactions  . Ciprofloxacin Shortness Of Breath and Other (See Comments)    Dizziness   . Hydrocodone Shortness Of Breath and Other (See Comments)    Tachycardia  . Hydrocortisone   . Penicillins Hives    Pt just remembers hives Has patient had a PCN reaction causing immediate rash, facial/tongue/throat swelling, SOB or lightheadedness with hypotension: No Has patient had a PCN reaction causing severe  rash involving mucus membranes or skin necrosis: No Has patient had a PCN reaction that required hospitalization No Has patient had a PCN reaction occurring within the last 10 years: No If all of the above answers are "NO", then may proceed with Cephalosporin use.    Current Outpatient Medications on File Prior to Visit  Medication Sig Dispense Refill  . aspirin 81 MG EC tablet Take 81 mg by mouth daily.    Marland Kitchen atorvastatin (LIPITOR) 40 MG tablet TAKE ONE TABLET BY MOUTH EVERY NIGHT AT BEDTIME (Patient taking differently: Take 40 mg by mouth at bedtime.) 90 tablet 3  . chlorhexidine (PERIDEX) 0.12 % solution as directed.    . cloNIDine (CATAPRES) 0.1 MG tablet TAKE ONE TABLET BY MOUTH TWICE A DAY (Patient taking differently: Take 0.1 mg by mouth 2 (two) times daily.) 60 tablet 2  . Continuous Blood Gluc Receiver (FREESTYLE LIBRE 2 READER) DEVI Use as directed twice daily E11.9 1 each 0  . Continuous Blood Gluc Sensor (FREESTYLE LIBRE 2 SENSOR) MISC Use as directed once bi-weekly E11.9 6 each 3  . Dulaglutide (TRULICITY [place]) Inject into the skin.    Marland Kitchen ELMIRON 100 MG capsule Take 200 mg by mouth 2 (two) times daily.     Marland Kitchen gabapentin (NEURONTIN) 600 MG tablet Take 600 mg by mouth See admin instructions. 1 tab in the am and at noon. 2 hs    . glucose blood (FREESTYLE TEST STRIPS) test strip Use as instructed to check blood sugar 4 times daily. 100 each 3  . glucose blood test strip Use as instructed 100 each 12  . insulin glargine (LANTUS SOLOSTAR) 100 UNIT/ML Solostar Pen Inject 18 Units into the skin daily. (Patient taking differently: Inject 10 Units into the skin daily.) 15 mL 2  . insulin lispro (HUMALOG) 100 UNIT/ML injection Inject 3-20 Units into the skin See admin instructions. Per sliding scale 3 times daily    . isosorbide mononitrate (IMDUR) 60 MG 24 hr tablet Take 1 tablet (60 mg total) by mouth daily. 90 tablet 3  . levothyroxine (SYNTHROID) 75 MCG tablet Take 3 tablets (225 mcg total)  by mouth daily at 6 (six) AM. 90 tablet 0  . metoprolol succinate (TOPROL-XL) 50 MG 24 hr tablet TAKE 1 TABLET BY MOUTH DAILY WITH OR IMMEDIATELY FOLLOWING A MEAL 90 tablet 3  . nitroGLYCERIN (NITROSTAT) 0.4 MG SL tablet Place 1 tablet (0.4 mg total) under the tongue every 5 (five) minutes as needed for chest pain. 20 tablet 0  . pantoprazole (PROTONIX) 40 MG tablet TAKE 1 TABLET(40 MG) BY MOUTH TWICE DAILY 180 tablet 3  . traMADol (ULTRAM) 50 MG tablet Take 1 tablet (50 mg total) by mouth  every 6 (six) hours as needed. 8 tablet 0  . azithromycin (ZITHROMAX) 250 MG tablet 2 tab by mouth day 1, then 1 per day (Patient not taking: No sig reported) 6 tablet 1  . doxycycline (VIBRA-TABS) 100 MG tablet Take 1 tablet (100 mg total) by mouth 2 (two) times daily. (Patient not taking: No sig reported) 20 tablet 0  . FLUoxetine (PROZAC) 20 MG capsule TAKE ONE CAPSULE BY MOUTH DAILY (Patient not taking: No sig reported) 90 capsule 3  . linaclotide (LINZESS) 72 MCG capsule Take 1 capsule (72 mcg total) by mouth daily before breakfast. (Patient not taking: No sig reported) 30 capsule 11  . losartan-hydrochlorothiazide (HYZAAR) 100-25 MG tablet Take 1 tablet by mouth daily. (Patient not taking: No sig reported)    . ondansetron (ZOFRAN) 4 MG tablet Take 1 tablet (4 mg total) by mouth every 8 (eight) hours as needed for nausea or vomiting. (Patient not taking: No sig reported) 30 tablet 1  . polyethylene glycol (MIRALAX / GLYCOLAX) 17 g packet Take 17 g by mouth daily as needed. (Patient not taking: No sig reported) 14 each 0   No current facility-administered medications on file prior to visit.        ROS:  All others reviewed and negative.  Objective        PE:  BP (!) 148/82 (BP Location: Right Arm, Patient Position: Sitting, Cuff Size: Large)   Pulse 82   Temp 98.1 F (36.7 C) (Oral)   Ht '5\' 6"'$  (1.676 m)   Wt 285 lb (129.3 kg)   SpO2 97%   BMI 46.00 kg/m                 Constitutional: Pt appears  in NAD               HENT: Head: NCAT.                Right Ear: External ear normal.                 Left Ear: External ear normal.                Eyes: . Pupils are equal, round, and reactive to light. Conjunctivae and EOM are normal               Nose: without d/c or deformity               Neck: Neck supple. Gross normal ROM               Cardiovascular: Normal rate and regular rhythm.                 Pulmonary/Chest: Effort normal and breath sounds without rales or wheezing.                Abd:  Soft, mild rlq tender, ND, + BS, no organomegaly, no guarding or rebound               Neurological: Pt is alert. At baseline orientation, motor grossly intact               Skin: Skin is warm. No rashes, no other new lesions, LE edema - trace bilat               Psychiatric: Pt behavior is normal without agitation   Micro: none  Cardiac tracings I have personally interpreted today:  none  Pertinent Radiological findings (summarize): Nov 09, 2020 ct  abd pelvis IMPRESSION: Diverticulosis without diverticulitis. Multiple hernias stable in appearance from the prior exam. The left lateral abdominal wall hernia demonstrates a loop of descending colon within although no obstructive changes are noted.    Lab Results  Component Value Date   WBC 6.8 01/09/2021   HGB 10.8 (L) 01/09/2021   HCT 32.9 (L) 01/09/2021   PLT 222.0 01/09/2021   GLUCOSE 198 (H) 01/09/2021   CHOL 175 01/26/2020   TRIG 71 01/26/2020   HDL 56 01/26/2020   LDLDIRECT 87.0 01/25/2020   LDLCALC 105 (H) 01/26/2020   ALT 12 01/09/2021   AST 14 01/09/2021   NA 139 01/09/2021   K 4.2 01/09/2021   CL 104 01/09/2021   CREATININE 1.47 (H) 01/09/2021   BUN 15 01/09/2021   CO2 27 01/09/2021   TSH 39.80 (H) 12/22/2020   INR 1.3 (H) 11/09/2020   HGBA1C 7.8 (A) 12/22/2020   MICROALBUR 1.5 07/06/2020   Assessment/Plan:  [name] is a 70 y.o. Black or African American [2] female with  has a past medical history of  Anxiety, Arthritis, Blood dyscrasia, CERVICAL RADICULOPATHY, LEFT, Chronic back pain, CKD (chronic kidney disease), COMMON MIGRAINE, CORONARY ARTERY DISEASE, Cough, Cystitis, DEPRESSION, Diabetes mellitus, type II ([place]), DIVERTICULOSIS-COLON, Dysrhythmia, Gastric ulcer (04/2008), Gastroparesis, GERD (gastroesophageal reflux disease), Hiatal hernia, Hyperlipidemia, Hypertension, Hypothyroidism, INSOMNIA-SLEEP DISORDER-UNSPEC, Iron deficiency anemia, and Wears glasses.  RLQ abdominal pain Etiology unclear, differential includes uti, constipation or renal stone but doubt the latter, vs other, for urine studies/labs and consider CT for leukocytosis or other abnormal  Insulin dependent type 2 diabetes mellitus ([place]) Lab Results  Component Value Date   HGBA1C 7.8 (A) 12/22/2020   Stable, pt to continue current medical treatment trulicity, lantus   Hypertension associated with diabetes ([place]) BP Readings from Last 3 Encounters:  01/09/21 (!) 148/82  12/08/20 118/60  11/24/20 132/80   Stable, pt to continue medical treatment catapres as BP mild elevated likely reactive,  to f/u any worsening symptoms or concerns   Acute lower UTI For urine culture as well in follow up   Followup: Return if symptoms worsen or fail to improve.  Cathlean Cower, MD 01/14/2021 2:34 AM [facility]

## 2021-01-09 NOTE — Telephone Encounter (Signed)
[facility] FYI  Advised to see PCP with 24 hours. Patient is scheduled for an appointment today with PCP.

## 2021-01-10 ENCOUNTER — Encounter: Payer: Self-pay | Admitting: Internal Medicine

## 2021-01-10 LAB — URINALYSIS, ROUTINE W REFLEX MICROSCOPIC
Bilirubin Urine: NEGATIVE
Hgb urine dipstick: NEGATIVE
Ketones, ur: NEGATIVE
Leukocytes,Ua: NEGATIVE
Nitrite: NEGATIVE
RBC / HPF: NONE SEEN (ref 0–?)
Specific Gravity, Urine: 1.02 (ref 1.000–1.030)
Total Protein, Urine: NEGATIVE
Urine Glucose: NEGATIVE
Urobilinogen, UA: 1 (ref 0.0–1.0)
pH: 6 (ref 5.0–8.0)

## 2021-01-14 ENCOUNTER — Encounter: Payer: Self-pay | Admitting: Internal Medicine

## 2021-01-14 NOTE — Assessment & Plan Note (Signed)
For urine culture as well in follow up

## 2021-01-14 NOTE — Assessment & Plan Note (Signed)
Lab Results  Component Value Date   HGBA1C 7.8 (A) 12/22/2020   Stable, pt to continue current medical treatment trulicity, lantus

## 2021-01-14 NOTE — Assessment & Plan Note (Signed)
BP Readings from Last 3 Encounters:  01/09/21 (!) 148/82  12/08/20 118/60  11/24/20 132/80   Stable, pt to continue medical treatment catapres as BP mild elevated likely reactive,  to f/u any worsening symptoms or concerns

## 2021-01-14 NOTE — Assessment & Plan Note (Signed)
Etiology unclear, differential includes uti, constipation or renal stone but doubt the latter, vs other, for urine studies/labs and consider CT for leukocytosis or other abnormal

## 2021-01-15 DIAGNOSIS — N3020 Other chronic cystitis without hematuria: Secondary | ICD-10-CM | POA: Diagnosis not present

## 2021-01-16 ENCOUNTER — Telehealth: Payer: Self-pay | Admitting: Internal Medicine

## 2021-01-16 DIAGNOSIS — R1031 Right lower quadrant pain: Secondary | ICD-10-CM

## 2021-01-16 NOTE — Telephone Encounter (Signed)
Patient called and was wondering if she could get a referral to GI. Her last OV was 01-09-21. She can be reached at [phone].

## 2021-01-16 NOTE — Telephone Encounter (Signed)
Ok this is done 

## 2021-01-17 ENCOUNTER — Ambulatory Visit: Payer: Medicare Other | Admitting: Endocrinology

## 2021-01-17 ENCOUNTER — Encounter: Payer: Self-pay | Admitting: Endocrinology

## 2021-01-19 ENCOUNTER — Other Ambulatory Visit: Payer: Self-pay | Admitting: Internal Medicine

## 2021-01-22 ENCOUNTER — Telehealth: Payer: Self-pay | Admitting: Endocrinology

## 2021-01-22 ENCOUNTER — Other Ambulatory Visit: Payer: Self-pay | Admitting: *Deleted

## 2021-01-22 MED ORDER — CLONIDINE HCL 0.1 MG PO TABS: 0.1000 mg | ORAL_TABLET | Freq: Two times a day (BID) | ORAL | 0 refills | Status: DC

## 2021-01-22 NOTE — Telephone Encounter (Signed)
Patients husband called again, they are very confused about what she's suppose to take and not take, I read your instructions off her last ov note on 12/22/20 and told her husband that she was told not to take Lantus or tresiba, but to take her 70/30 insulin.  Both patient and husband were very upset, I explained that I would send you a note and call them in the morning after you responded.

## 2021-01-22 NOTE — Telephone Encounter (Signed)
On her last visit she was told not to take Lantus or [place].

## 2021-01-22 NOTE — Telephone Encounter (Signed)
Pt husband is calling regarding receiving medications from pharmacy. Understands how to take the trulicity . The other two directions were not given on  Basaglar Tresiba   Dulaglutide (TRULICITY [place])   Pt request a call back at [phone] as soon as possible with instructions on how to take the two medications.

## 2021-01-23 NOTE — Telephone Encounter (Signed)
Noted,  Thank you!

## 2021-01-23 NOTE — Telephone Encounter (Signed)
Called pt back regarding medication 70/30 insulin  Instructions and hold for now for the basaglar and tresiba per Dr. Dwyane Dee.

## 2021-01-23 NOTE — Telephone Encounter (Signed)
Please let her know that she needs to follow instructions given on her last visit.  She did not come back for follow-up as instructed.  We can see her for 1 final visit if she is able to keep the appointment

## 2021-01-24 ENCOUNTER — Telehealth: Payer: Self-pay | Admitting: Endocrinology

## 2021-01-24 NOTE — Telephone Encounter (Signed)
Patient dismissed from [facility] by Dr. Elayne Snare, effective 01/17/21. Dismissal Letter sent out by 1st class mail [place] 01/24/21

## 2021-01-26 ENCOUNTER — Telehealth: Payer: Self-pay | Admitting: Endocrinology

## 2021-01-26 NOTE — Telephone Encounter (Signed)
[name] called wanted to know who called his wife yesterday. Was not able to provide any name, message, etc.  [name] stated to call home and cell if someone wants to speak with them.  Then hung up

## 2021-01-31 ENCOUNTER — Telehealth: Payer: Self-pay | Admitting: Endocrinology

## 2021-01-31 DIAGNOSIS — E039 Hypothyroidism, unspecified: Secondary | ICD-10-CM | POA: Diagnosis not present

## 2021-01-31 DIAGNOSIS — E1122 Type 2 diabetes mellitus with diabetic chronic kidney disease: Secondary | ICD-10-CM | POA: Diagnosis not present

## 2021-01-31 DIAGNOSIS — E1165 Type 2 diabetes mellitus with hyperglycemia: Secondary | ICD-10-CM | POA: Diagnosis not present

## 2021-01-31 DIAGNOSIS — N184 Chronic kidney disease, stage 4 (severe): Secondary | ICD-10-CM | POA: Diagnosis not present

## 2021-01-31 DIAGNOSIS — Z794 Long term (current) use of insulin: Secondary | ICD-10-CM | POA: Diagnosis not present

## 2021-01-31 NOTE — Telephone Encounter (Signed)
Called patient to offer an appointment to provider after spouse called on on 4/26 to say that she need to be seen before her dismissal from practice. Confirmed call back phone number with spouse [phone]. Attempted call this morning to offer an appointment and voicemail is full.

## 2021-02-01 NOTE — Telephone Encounter (Signed)
2nd attempt to call patient and schedule to see provider at phone number given by patient. [phone], voicemail is full.

## 2021-02-02 DIAGNOSIS — E119 Type 2 diabetes mellitus without complications: Secondary | ICD-10-CM | POA: Insufficient documentation

## 2021-02-08 DIAGNOSIS — M5030 Other cervical disc degeneration, unspecified cervical region: Secondary | ICD-10-CM | POA: Diagnosis not present

## 2021-02-08 DIAGNOSIS — M961 Postlaminectomy syndrome, not elsewhere classified: Secondary | ICD-10-CM | POA: Diagnosis not present

## 2021-02-08 DIAGNOSIS — Z9689 Presence of other specified functional implants: Secondary | ICD-10-CM | POA: Diagnosis not present

## 2021-02-08 DIAGNOSIS — R296 Repeated falls: Secondary | ICD-10-CM | POA: Diagnosis not present

## 2021-02-09 ENCOUNTER — Other Ambulatory Visit: Payer: Self-pay

## 2021-02-09 ENCOUNTER — Encounter: Payer: Self-pay | Admitting: Podiatry

## 2021-02-09 ENCOUNTER — Ambulatory Visit (INDEPENDENT_AMBULATORY_CARE_PROVIDER_SITE_OTHER): Payer: Medicare Other | Admitting: Podiatry

## 2021-02-09 DIAGNOSIS — E1165 Type 2 diabetes mellitus with hyperglycemia: Secondary | ICD-10-CM

## 2021-02-09 DIAGNOSIS — M79674 Pain in right toe(s): Secondary | ICD-10-CM

## 2021-02-09 DIAGNOSIS — N184 Chronic kidney disease, stage 4 (severe): Secondary | ICD-10-CM

## 2021-02-09 DIAGNOSIS — IMO0002 Reserved for concepts with insufficient information to code with codable children: Secondary | ICD-10-CM

## 2021-02-09 DIAGNOSIS — B351 Tinea unguium: Secondary | ICD-10-CM | POA: Diagnosis not present

## 2021-02-09 DIAGNOSIS — E1122 Type 2 diabetes mellitus with diabetic chronic kidney disease: Secondary | ICD-10-CM

## 2021-02-09 DIAGNOSIS — M79675 Pain in left toe(s): Secondary | ICD-10-CM | POA: Diagnosis not present

## 2021-02-14 ENCOUNTER — Ambulatory Visit (INDEPENDENT_AMBULATORY_CARE_PROVIDER_SITE_OTHER): Payer: Medicare Other

## 2021-02-14 ENCOUNTER — Ambulatory Visit (INDEPENDENT_AMBULATORY_CARE_PROVIDER_SITE_OTHER): Payer: Medicare Other | Admitting: Podiatry

## 2021-02-14 ENCOUNTER — Other Ambulatory Visit: Payer: Self-pay

## 2021-02-14 ENCOUNTER — Encounter: Payer: Self-pay | Admitting: Podiatry

## 2021-02-14 DIAGNOSIS — M779 Enthesopathy, unspecified: Secondary | ICD-10-CM

## 2021-02-14 DIAGNOSIS — N184 Chronic kidney disease, stage 4 (severe): Secondary | ICD-10-CM | POA: Diagnosis not present

## 2021-02-14 DIAGNOSIS — E039 Hypothyroidism, unspecified: Secondary | ICD-10-CM | POA: Diagnosis not present

## 2021-02-14 DIAGNOSIS — M76822 Posterior tibial tendinitis, left leg: Secondary | ICD-10-CM

## 2021-02-14 DIAGNOSIS — E1165 Type 2 diabetes mellitus with hyperglycemia: Secondary | ICD-10-CM | POA: Diagnosis not present

## 2021-02-14 DIAGNOSIS — E1122 Type 2 diabetes mellitus with diabetic chronic kidney disease: Secondary | ICD-10-CM | POA: Diagnosis not present

## 2021-02-14 DIAGNOSIS — Z794 Long term (current) use of insulin: Secondary | ICD-10-CM | POA: Diagnosis not present

## 2021-02-14 MED ORDER — TRIAMCINOLONE ACETONIDE 10 MG/ML IJ SUSP
10.0000 mg | Freq: Once | INTRAMUSCULAR | Status: AC
  Administered 2021-02-14: 10 mg

## 2021-02-14 NOTE — Progress Notes (Signed)
Subjective:   Patient ID: [name], female   DOB: 70 y.o.   MRN: [id number]   HPI Patient states the inside ankles been feeling a little better but the outside of the ankle has been very tender.  She is been trying to wear her boot as best as possible and also wears her AFO brace   ROS      Objective:  Physical Exam  Neurovascular status intact with exquisite discomfort in the sinus tarsi left with inflammation and medial pain still present but improved     Assessment:  Capsulitis of the sinus tarsi left along with posterior tibial tendinitis left with moderate improvement     Plan:  H&P reviewed both conditions sterile prep injected the sinus tarsi left 3 mg Kenalog 5 mg Xylocaine advised on support AFO or boot usage and will be seen back as needed.  Obesity is a big part of the issue that she is dealing with with this

## 2021-02-17 NOTE — Progress Notes (Signed)
Subjective: [name] is a pleasant 70 y.o. female patient seen today for at risk diabetic foot care.  She is seen for painful thick toenails that are difficult to trim. Pain interferes with ambulation. Aggravating factors include wearing enclosed shoe gear. Pain is relieved with periodic professional debridement.  Patient is accompanied by her husband on today's visit.  Her blood glucose is 135 mg/dL on yesterday.  Patient continues to complain of discomfort of the left lower extremity.  She has been followed by Dr. Paulla Dolly for this.  She has not been wearing her brace stating it is uncomfortable.  Allergies  Allergen Reactions  . Ciprofloxacin Shortness Of Breath and Other (See Comments)    Dizziness   . Hydrocodone Shortness Of Breath and Other (See Comments)    Tachycardia  . Hydrocortisone   . Penicillins Hives    Pt just remembers hives Has patient had a PCN reaction causing immediate rash, facial/tongue/throat swelling, SOB or lightheadedness with hypotension: No Has patient had a PCN reaction causing severe rash involving mucus membranes or skin necrosis: No Has patient had a PCN reaction that required hospitalization No Has patient had a PCN reaction occurring within the last 10 years: No If all of the above answers are "NO", then may proceed with Cephalosporin use.     Objective: Physical Exam  General: [name] is a pleasant 70 y.o. African American female, morbidly obese in NAD. AAO x 3.   Vascular:  Capillary refill time to digits immediate b/l. Palpable pedal pulses b/l LE. Pedal hair sparse b/l lower extremities. Lower extremity skin temperature gradient within normal limits. No pain with calf compression b/l. No edema noted b/l lower extremities.   Dermatological:  Pedal skin with normal turgor, texture and tone bilaterally. No open wounds bilaterally. No interdigital macerations bilaterally. Toenails 1-5 b/l elongated, discolored, dystrophic, thickened,  crumbly with subungual debris and tenderness to dorsal palpation. No hyperkeratotic nor porokeratotic lesions present on today's visit.  Musculoskeletal:  Normal muscle strength 5/5 to all lower extremity muscle groups bilaterally. No pain crepitus or joint limitation noted with ROM b/l.  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  There is pain on palpation along the medial aspect of the left ankle joint.  Neurological:  Protective sensation intact 5/5 intact bilaterally with 10g monofilament b/l. Vibratory sensation intact b/l. Clonus negative b/l.  Assessment and Plan:  1. Pain due to onychomycosis of toenails of both feet   2. 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 -Patient to continue soft, supportive shoe gear daily. -Toenails 1-5 b/l were debrided in length and girth with sterile nail nippers and dremel without iatrogenic bleeding.  -Patient to report any pedal injuries to medical professional immediately. -I did have a discussion with her regarding wearing the brace for her left lower extremity.  I did inform her and her husband if the brace is uncomfortable they may bring it back for the Pedorthist to assess and if there are any modifications that need to be made they can be made at that time.  I did inform her if she continues to walk without the brace that her deformity will progress and perhaps eventually render her immobile due to the severe flatfoot deformity.  They related understanding. -Patient/POA to call should there be question/concern in the interim.  Return in about 3 months (around 05/12/2021).  Marzetta Board, DPM

## 2021-02-23 ENCOUNTER — Telehealth: Payer: Self-pay | Admitting: Internal Medicine

## 2021-02-23 NOTE — Telephone Encounter (Signed)
Patient notified to take imodium and to stay hydrated.

## 2021-02-23 NOTE — Telephone Encounter (Signed)
Patient calling, wondering if Dr. Jenny Reichmann can send her something in for diarrhea. Patient denies appointment states she cannot leave the house without an accident.  Kristopher Oppen[facility] [address], [place] Phone:  [phone]  Fax:  [phone]

## 2021-02-23 NOTE — Telephone Encounter (Signed)
Make sure to try otc immodium or kaopectate first please

## 2021-02-26 ENCOUNTER — Emergency Department (HOSPITAL_COMMUNITY): Payer: Medicare Other

## 2021-02-26 ENCOUNTER — Emergency Department (HOSPITAL_COMMUNITY)
Admission: EM | Admit: 2021-02-26 | Discharge: 2021-02-26 | Disposition: A | Discharge status: Home / Self Care | Payer: Medicare Other | Attending: Emergency Medicine | Admitting: Emergency Medicine

## 2021-02-26 ENCOUNTER — Encounter (HOSPITAL_COMMUNITY): Payer: Self-pay | Admitting: Emergency Medicine

## 2021-02-26 DIAGNOSIS — R197 Diarrhea, unspecified: Secondary | ICD-10-CM | POA: Insufficient documentation

## 2021-02-26 DIAGNOSIS — Z5321 Procedure and treatment not carried out due to patient leaving prior to being seen by health care provider: Secondary | ICD-10-CM | POA: Diagnosis not present

## 2021-02-26 DIAGNOSIS — R112 Nausea with vomiting, unspecified: Secondary | ICD-10-CM | POA: Insufficient documentation

## 2021-02-26 DIAGNOSIS — R9431 Abnormal electrocardiogram [ECG] [EKG]: Secondary | ICD-10-CM | POA: Diagnosis not present

## 2021-02-26 DIAGNOSIS — Z20822 Contact with and (suspected) exposure to covid-19: Secondary | ICD-10-CM | POA: Diagnosis not present

## 2021-02-26 DIAGNOSIS — R5383 Other fatigue: Secondary | ICD-10-CM | POA: Diagnosis not present

## 2021-02-26 DIAGNOSIS — R300 Dysuria: Secondary | ICD-10-CM | POA: Diagnosis not present

## 2021-02-26 DIAGNOSIS — R1032 Left lower quadrant pain: Secondary | ICD-10-CM | POA: Diagnosis not present

## 2021-02-26 NOTE — ED Provider Notes (Signed)
Emergency Medicine Provider Triage Evaluation Note  [name] 70 y.o. female was evaluated in triage.  Pt complains of nausea/vomiting/diarrhea and left lower quadrant abdominal pain that began last night.  She reports she has not been able to keep anything down.  No blood noted in vomiting or diarrhea.  She states she has had some left lower quadrant abdominal pain.  She thinks maybe she has had a history of diverticulitis but is unsure.  No fevers.  Denies any chest pain, difficulty breathing.  No dysuria, hematuria.   Review of Systems  Positive: Abdominal pain, nausea/vomiting/diarrhea. Negative: Fevers, dysuria, hematuria, chest pain, difficulty breathing.  Physical Exam  BP 134/82   Pulse 70   Temp 98.2 F (36.8 C) (Oral)   Resp 18   Ht '5\' 4"'$  (1.626 m)   Wt 65.8 kg   SpO2 100%   BMI 24.89 kg/m  Gen:   Awake, no distress   HEENT:  Atraumatic  Resp:  Normal effort  Cardiac:  Normal rate  Abd:   Nondistended, diffuse tenderness palpation in left lower quadrant.  No rigidity, guarding. MSK:   Moves extremities without difficulty  Neuro:  Speech clear   Other:      Medical Decision Making  Medically screening exam initiated at 3:35 PM  Appropriate orders placed.  [name] was informed that the remainder of the evaluation will be completed by another provider, this initial triage assessment does not replace that evaluation. They are counseled that they will need to remain in the ED until the completion of their workup, including full H&P and results of any tests.  Risks of leaving the emergency department prior to completion of treatment were discussed. Patient was advised to inform ED staff if they are leaving before their treatment is complete. The patient acknowledged these risks and time was allowed for questions.     The patient appears stable so that the remainder of the MSE may be completed by another provider.    Clinical Impression  Abdominal  pain.   Portions of this note were generated with [employer]. Dictation errors may occur despite best attempts at proofreading.      Volanda Napoleon, PA-C 02/26/21 1536    Lennice Sites, DO 02/26/21 1629

## 2021-02-26 NOTE — ED Triage Notes (Signed)
Per pt, states she has had diarrhea for 3 days-now having right flank pain and dysuria-history of UTI's

## 2021-02-28 DIAGNOSIS — R112 Nausea with vomiting, unspecified: Secondary | ICD-10-CM | POA: Diagnosis not present

## 2021-02-28 DIAGNOSIS — E860 Dehydration: Secondary | ICD-10-CM | POA: Diagnosis not present

## 2021-03-01 ENCOUNTER — Other Ambulatory Visit: Payer: Self-pay | Admitting: Endocrinology

## 2021-03-01 NOTE — Telephone Encounter (Signed)
Please refill as per office routine med refill policy (all routine meds refilled for 3 mo or monthly per pt preference up to one year from last visit, then month to month grace period for 3 mo, then further med refills will have to be denied)  

## 2021-03-02 ENCOUNTER — Other Ambulatory Visit: Payer: Medicare Other

## 2021-03-02 ENCOUNTER — Emergency Department (HOSPITAL_BASED_OUTPATIENT_CLINIC_OR_DEPARTMENT_OTHER)
Admission: EM | Admit: 2021-03-02 | Discharge: 2021-03-02 | Disposition: A | Discharge status: Home / Self Care | Payer: Medicare Other | Attending: Emergency Medicine | Admitting: Emergency Medicine

## 2021-03-02 ENCOUNTER — Encounter (HOSPITAL_BASED_OUTPATIENT_CLINIC_OR_DEPARTMENT_OTHER): Payer: Self-pay | Admitting: *Deleted

## 2021-03-02 ENCOUNTER — Emergency Department (HOSPITAL_BASED_OUTPATIENT_CLINIC_OR_DEPARTMENT_OTHER): Payer: Medicare Other

## 2021-03-02 ENCOUNTER — Other Ambulatory Visit (HOSPITAL_BASED_OUTPATIENT_CLINIC_OR_DEPARTMENT_OTHER): Payer: Self-pay

## 2021-03-02 ENCOUNTER — Other Ambulatory Visit: Payer: Self-pay

## 2021-03-02 DIAGNOSIS — Z96653 Presence of artificial knee joint, bilateral: Secondary | ICD-10-CM | POA: Insufficient documentation

## 2021-03-02 DIAGNOSIS — Z79899 Other long term (current) drug therapy: Secondary | ICD-10-CM | POA: Diagnosis not present

## 2021-03-02 DIAGNOSIS — Z794 Long term (current) use of insulin: Secondary | ICD-10-CM | POA: Insufficient documentation

## 2021-03-02 DIAGNOSIS — N184 Chronic kidney disease, stage 4 (severe): Secondary | ICD-10-CM | POA: Diagnosis not present

## 2021-03-02 DIAGNOSIS — K5792 Diverticulitis of intestine, part unspecified, without perforation or abscess without bleeding: Secondary | ICD-10-CM | POA: Diagnosis not present

## 2021-03-02 DIAGNOSIS — K529 Noninfective gastroenteritis and colitis, unspecified: Secondary | ICD-10-CM | POA: Diagnosis not present

## 2021-03-02 DIAGNOSIS — R1032 Left lower quadrant pain: Secondary | ICD-10-CM | POA: Diagnosis present

## 2021-03-02 DIAGNOSIS — E1169 Type 2 diabetes mellitus with other specified complication: Secondary | ICD-10-CM | POA: Diagnosis not present

## 2021-03-02 DIAGNOSIS — Z87891 Personal history of nicotine dependence: Secondary | ICD-10-CM | POA: Insufficient documentation

## 2021-03-02 DIAGNOSIS — E1122 Type 2 diabetes mellitus with diabetic chronic kidney disease: Secondary | ICD-10-CM | POA: Diagnosis not present

## 2021-03-02 DIAGNOSIS — K4090 Unilateral inguinal hernia, without obstruction or gangrene, not specified as recurrent: Secondary | ICD-10-CM | POA: Diagnosis not present

## 2021-03-02 DIAGNOSIS — Z7982 Long term (current) use of aspirin: Secondary | ICD-10-CM | POA: Insufficient documentation

## 2021-03-02 DIAGNOSIS — E039 Hypothyroidism, unspecified: Secondary | ICD-10-CM | POA: Insufficient documentation

## 2021-03-02 DIAGNOSIS — I129 Hypertensive chronic kidney disease with stage 1 through stage 4 chronic kidney disease, or unspecified chronic kidney disease: Secondary | ICD-10-CM | POA: Diagnosis not present

## 2021-03-02 DIAGNOSIS — I2510 Atherosclerotic heart disease of native coronary artery without angina pectoris: Secondary | ICD-10-CM | POA: Insufficient documentation

## 2021-03-02 DIAGNOSIS — E785 Hyperlipidemia, unspecified: Secondary | ICD-10-CM | POA: Insufficient documentation

## 2021-03-02 DIAGNOSIS — K439 Ventral hernia without obstruction or gangrene: Secondary | ICD-10-CM | POA: Diagnosis not present

## 2021-03-02 DIAGNOSIS — K5732 Diverticulitis of large intestine without perforation or abscess without bleeding: Secondary | ICD-10-CM | POA: Diagnosis not present

## 2021-03-02 LAB — CBC WITH DIFFERENTIAL/PLATELET
Abs Immature Granulocytes: 0.02 10*3/uL (ref 0.00–0.07)
Basophils Absolute: 0 10*3/uL (ref 0.0–0.1)
Basophils Relative: 0 %
Eosinophils Absolute: 0.1 10*3/uL (ref 0.0–0.5)
Eosinophils Relative: 1 %
HCT: 36.1 % (ref 36.0–46.0)
Hemoglobin: 11.6 g/dL — ABNORMAL LOW (ref 12.0–15.0)
Immature Granulocytes: 0 %
Lymphocytes Relative: 21 %
Lymphs Abs: 1.6 10*3/uL (ref 0.7–4.0)
MCH: 32 pg (ref 26.0–34.0)
MCHC: 32.1 g/dL (ref 30.0–36.0)
MCV: 99.4 fL (ref 80.0–100.0)
Monocytes Absolute: 0.4 10*3/uL (ref 0.1–1.0)
Monocytes Relative: 5 %
Neutro Abs: 5.4 10*3/uL (ref 1.7–7.7)
Neutrophils Relative %: 73 %
Platelets: 236 10*3/uL (ref 150–400)
RBC: 3.63 MIL/uL — ABNORMAL LOW (ref 3.87–5.11)
RDW: 12.3 % (ref 11.5–15.5)
WBC: 7.4 10*3/uL (ref 4.0–10.5)
nRBC: 0 % (ref 0.0–0.2)

## 2021-03-02 LAB — COMPREHENSIVE METABOLIC PANEL
ALT: 19 U/L (ref 0–44)
AST: 20 U/L (ref 15–41)
Albumin: 3.8 g/dL (ref 3.5–5.0)
Alkaline Phosphatase: 89 U/L (ref 38–126)
Anion gap: 9 (ref 5–15)
BUN: 15 mg/dL (ref 8–23)
CO2: 25 mmol/L (ref 22–32)
Calcium: 9.8 mg/dL (ref 8.9–10.3)
Chloride: 100 mmol/L (ref 98–111)
Creatinine, Ser: 1.32 mg/dL — ABNORMAL HIGH (ref 0.44–1.00)
GFR, Estimated: 43 mL/min — ABNORMAL LOW (ref >60)
Glucose, Bld: 210 mg/dL — ABNORMAL HIGH (ref 70–99)
Potassium: 4.3 mmol/L (ref 3.5–5.1)
Sodium: 134 mmol/L — ABNORMAL LOW (ref 135–145)
Total Bilirubin: 0.6 mg/dL (ref 0.3–1.2)
Total Protein: 8.2 g/dL — ABNORMAL HIGH (ref 6.5–8.1)

## 2021-03-02 LAB — LIPASE, BLOOD: Lipase: 32 U/L (ref 11–51)

## 2021-03-02 MED ORDER — ONDANSETRON HCL 4 MG/2ML IJ SOLN
4.0000 mg | Freq: Once | INTRAMUSCULAR | Status: AC
  Administered 2021-03-02: 4 mg via INTRAVENOUS
  Filled 2021-03-02: qty 2

## 2021-03-02 MED ORDER — AMOXICILLIN-POT CLAVULANATE 875-125 MG PO TABS
1.0000 | ORAL_TABLET | Freq: Once | ORAL | Status: AC
  Administered 2021-03-02: 1 via ORAL
  Filled 2021-03-02: qty 1

## 2021-03-02 MED ORDER — AMOXICILLIN-POT CLAVULANATE 875-125 MG PO TABS
1.0000 | ORAL_TABLET | Freq: Two times a day (BID) | ORAL | 0 refills | Status: DC
  Filled 2021-03-02: qty 20, 10d supply, fill #0

## 2021-03-02 MED ORDER — ONDANSETRON HCL 4 MG PO TABS
4.0000 mg | ORAL_TABLET | Freq: Four times a day (QID) | ORAL | 0 refills | Status: DC
  Filled 2021-03-02: qty 12, 3d supply, fill #0

## 2021-03-02 MED ORDER — FENTANYL CITRATE (PF) 100 MCG/2ML IJ SOLN
50.0000 ug | Freq: Once | INTRAMUSCULAR | Status: AC
  Administered 2021-03-02: 50 ug via INTRAVENOUS
  Filled 2021-03-02: qty 2

## 2021-03-02 MED ORDER — SODIUM CHLORIDE 0.9 % IV BOLUS
1000.0000 mL | Freq: Once | INTRAVENOUS | Status: AC
  Administered 2021-03-02: 1000 mL via INTRAVENOUS

## 2021-03-02 NOTE — ED Provider Notes (Signed)
[facility] EMERGENCY DEPARTMENT Provider Note   CSN: [id number] Arrival date & time: 03/02/21  1326     History Chief Complaint  Patient presents with  . Emesis  . Abdominal Pain  . Diarrhea    [name] is a 70 y.o. female.  The history is provided by the patient.  Abdominal Pain Pain location:  LLQ Pain quality: not aching   Pain radiates to:  Does not radiate Pain severity:  Mild Onset quality:  Gradual Timing:  Intermittent Progression:  Waxing and waning Chronicity:  New Context: previous surgery   Relieved by:  Nothing Worsened by:  Nothing Associated symptoms: diarrhea, nausea and vomiting   Associated symptoms: no chest pain, no chills, no cough, no dysuria, no fever, no hematuria, no shortness of breath and no sore throat   Risk factors: multiple surgeries        Past Medical History:  Diagnosis Date  . Anxiety   . Arthritis   . Blood dyscrasia    "FREE BLEEDER"  . CERVICAL RADICULOPATHY, LEFT   . Chronic back pain   . CKD (chronic kidney disease)    DR. SANFORD  [facility]  . COMMON MIGRAINE   . CORONARY ARTERY DISEASE   . Cough    CURRENT COLD  . Cystitis   . DEPRESSION   . Diabetes mellitus, type II ([place])   . DIVERTICULOSIS-COLON   . Dysrhythmia    palpitations  . Gastric ulcer 04/2008  . Gastroparesis   . GERD (gastroesophageal reflux disease)   . Hiatal hernia   . Hyperlipidemia   . Hypertension   . Hypothyroidism   . INSOMNIA-SLEEP DISORDER-UNSPEC   . Iron deficiency anemia   . Wears glasses     Patient Active Problem List   Diagnosis Date Noted  . Hypothyroidism 11/24/2020  . Chronic pain 11/24/2020  . Severe sepsis ([place]) 11/09/2020  . Hyperlipidemia associated with type 2 diabetes mellitus ([place]) 11/09/2020  . Acute metabolic encephalopathy 99991111  . Acute lower UTI 11/09/2020  . Gait disorder 10/11/2020  . Fall 10/11/2020  . RLQ abdominal pain 05/05/2020  . Low grade fever 02/04/2020  . Chest  pain 01/26/2020  . Rectal bleeding 09/01/2019  . History of peptic ulcer disease 09/01/2019  . Screening for viral disease 09/01/2019  . Insulin dependent type 2 diabetes mellitus ([place]) 09/01/2019  . Pyelonephritis 06/05/2019  . Recurrent UTI 06/05/2019  . Urinary incontinence 06/05/2019  . Nausea & vomiting 02/08/2019  . Abdominal pain 02/08/2019  . Constipation 02/08/2019  . UTI (urinary tract infection) 02/08/2019  . LUQ pain 01/21/2019  . Cervical radiculopathy at C6 01/28/2018  . Left lateral epicondylitis 01/23/2018  . Obesity 01/23/2018  . Weakness 05/01/2017  . Hyperkalemia 05/01/2017  . Acute kidney injury superimposed on CKD ([place]) 05/01/2017  . Hypotension 05/01/2017  . Sepsis ([place]) 05/01/2017  . Dysuria 04/23/2017  . Right leg swelling 12/27/2016  . Leg pain, lateral, right 11/05/2016  . Radiculopathy 04/03/2016  . Hypersomnolence 11/23/2015  . Right sided abdominal pain 07/25/2015  . Malaise and fatigue 11/05/2014  . Rotator cuff syndrome 07/26/2014  . Fracture of fifth metacarpal bone of right hand 07/08/2014  . Right cervical radiculopathy 06/27/2014  . Chronic interstitial cystitis 05/23/2014  . Osteoarthritis of [place] joint of thumb 04/11/2014  . De Quervain's tenosynovitis, left 03/14/2014  . CKD (chronic kidney disease) stage 4, GFR 15-29 ml/min ([facility]) 03/03/2014  . Lower back pain 03/03/2014  . Ingrown nail 03/03/2014  . Peripheral  edema 02/23/2014  . Lateral epicondylitis of right elbow 01/31/2014  . Neck pain on left side 11/19/2013  . Abdominal discomfort 10/12/2012  . Right lumbar radiculopathy 05/11/2012  . Peripheral neuropathy 05/11/2012  . Vertigo 03/29/2012  . Headache(784.0) 03/10/2012  . Lumbar radiculopathy 10/24/2011  . 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 Right leg weakness 04/04/2011  . Hematochezia 01/07/2011  . Preventative health care 01/07/2011  . CHOLELITHIASIS 05/04/2009  . GERD 04/26/2009  . Gastroparesis 04/26/2009   . ULCER-GASTRIC 03/15/2009  . DIVERTICULOSIS-COLON 03/15/2009  . Cervical radiculopathy 10/28/2008  . MENOPAUSAL DISORDER 06/29/2008  . Pain in joint, shoulder region 10/12/2007  . INSOMNIA-SLEEP DISORDER-UNSPEC 09/01/2007  . Depression 09/01/2007  . ANEMIA-NOS 08/13/2007  . COMMON MIGRAINE 08/13/2007  . 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 Diabetes ([place]) 05/06/2007  . HLD (hyperlipidemia) 05/06/2007  . ANXIETY 05/06/2007  . Hypertension associated with diabetes ([place]) 05/06/2007  . Coronary atherosclerosis 05/06/2007    Past Surgical History:  Procedure Laterality Date  . ABDOMINAL HYSTERECTOMY    . ANTERIOR LAT LUMBAR FUSION Left 08/13/2017   Procedure: LEFT SIDED LUMBAR 3-4 LATERAL INTERBODY FUSION WITH INSTRUMENTATION AND ALLOGRAFT;  Surgeon: Phylliss Bob, MD;  Location: [facility];  Service: Orthopedics;  Laterality: Left;  LEFT SIDED LUMBAR 3-4 LATERAL INTERBODY FUSION WITH INSTRUMENTATION AND ALLOGRAFT; REQUEST 3 HOURS  . Back Stimulator  07/2018  . BACK SURGERY  03/2016  . BREAST EXCISIONAL BIOPSY Right    40 years ago  . CHOLECYSTECTOMY  06/2009  . COLONOSCOPY    . ESOPHAGOGASTRODUODENOSCOPY    . EYE SURGERY Bilateral    lasik  . Gastric Wedge resection lipoma  11/2007   x2 with laparotomy and gastrostomy  . JOINT REPLACEMENT    . Left knee replacement    . Rigth knee replacement with revision  04/2008   Dr. Berenice Primas  . ROTATOR CUFF REPAIR Left 01/2009  . s/p bladder surgury  09/2009   Dr. Terance Hart  . SPINAL CORD STIMULATOR INSERTION N/A 09/11/2018   Procedure: LUMBAR SPINAL CORD STIMULATOR INSERTION;  Surgeon: Clydell Hakim, MD;  Location: [facility];  Service: Neurosurgery;  Laterality: N/A;  LUMBAR SPINAL CORD STIMULATOR INSERTION     OB History    Gravida  3   Para  3   Term      Preterm      AB      Living  3     SAB      IAB      Ectopic      Multiple      Live Births              Family History  Problem Relation Age of Onset  . Diabetes  Sister        x 3  . Heart disease Sister        x2  . Breast cancer Sister 64  . Diabetes Brother        x3  . Heart disease Brother        x2  . Coronary artery disease Other        female 1st degree  . Hypertension Other   . Breast cancer Sister 65  . Breast cancer Other 25  . Diabetes Brother   . Colon cancer Neg Hx     Social History   Tobacco Use  . Smoking status: Former Research scientist (life sciences)  . Smokeless tobacco: Never Used  . Tobacco comment: quit 30 years ago  Vaping Use  . Vaping  Use: Never used  Substance Use Topics  . Alcohol use: No    Alcohol/week: 0.0 standard drinks  . Drug use: No    Home Medications Prior to Admission medications   Medication Sig Start Date End Date Taking? Authorizing Provider  amoxicillin-clavulanate (AUGMENTIN) 875-125 MG tablet Take 1 tablet by mouth 2 (two) times daily for 10 days. 03/02/21 03/12/21 Yes Donisha Hoch, DO  ondansetron (ZOFRAN) 4 MG tablet Take 1 tablet (4 mg total) by mouth every 6 (six) hours. 03/02/21  Yes Kerith Sherley, DO  amLODipine (NORVASC) 5 MG tablet Take 1 tablet by mouth daily. 12/12/20   [provider]  aspirin 81 MG EC tablet Take 81 mg by mouth daily.    [provider]  atorvastatin (LIPITOR) 40 MG tablet TAKE ONE TABLET BY MOUTH EVERY NIGHT AT BEDTIME Patient taking differently: Take 40 mg by mouth at bedtime. 06/13/20   Biagio Borg, MD  azithromycin (ZITHROMAX) 250 MG tablet 2 tab by mouth day 1, then 1 per day Patient not taking: No sig reported 11/28/20   Biagio Borg, MD  cephALEXin (KEFLEX) 500 MG capsule Take by mouth. 12/10/20   [provider]  chlorhexidine (PERIDEX) 0.12 % solution as directed. 11/21/20   [provider]  cloNIDine (CATAPRES) 0.1 MG tablet TAKE ONE TABLET BY MOUTH TWICE A DAY 03/01/21   Biagio Borg, MD  Continuous Blood Gluc Receiver (FREESTYLE LIBRE 2 READER) DEVI Use as directed twice daily E11.9 10/11/20   Biagio Borg, MD  Continuous Blood Gluc Sensor  (FREESTYLE LIBRE 2 SENSOR) MISC Use as directed once bi-weekly E11.9 10/11/20   Biagio Borg, MD  doxycycline (VIBRA-TABS) 100 MG tablet Take 1 tablet (100 mg total) by mouth 2 (two) times daily. Patient not taking: No sig reported 11/28/20   Biagio Borg, MD  Dulaglutide (TRULICITY) 1.5 0000000 SOPN Inject into the skin. 02/02/21   [provider]  ELMIRON 100 MG capsule Take 200 mg by mouth 2 (two) times daily.  07/20/13   [provider]  FLUoxetine (PROZAC) 20 MG capsule TAKE ONE CAPSULE BY MOUTH DAILY Patient not taking: No sig reported 11/13/20   Biagio Borg, MD  gabapentin (NEURONTIN) 600 MG tablet Take 600 mg by mouth See admin instructions. 1 tab in the am and at noon. 2 hs 01/24/20   [provider]  glucose blood (FREESTYLE TEST STRIPS) test strip Use as instructed to check blood sugar 4 times daily. 04/20/18   Elayne Snare, MD  glucose blood test strip Use as instructed 12/21/20   Elayne Snare, MD  Insulin Glargine [facility]) 100 UNIT/ML Inject 30 Units into the skin daily. 02/02/21   [provider]  insulin lispro (HUMALOG) 100 UNIT/ML injection Inject 3-20 Units into the skin See admin instructions. Per sliding scale 3 times daily    [provider]  isosorbide mononitrate (IMDUR) 60 MG 24 hr tablet Take 1 tablet (60 mg total) by mouth daily. 02/21/20   Biagio Borg, MD  levothyroxine (SYNTHROID) 75 MCG tablet Take 3 tablets (225 mcg total) by mouth daily at 6 (six) AM. 11/12/20 12/12/20  Barb Merino, MD  linaclotide Rolan Lipa) 72 MCG capsule Take 1 capsule (72 mcg total) by mouth daily before breakfast. Patient not taking: No sig reported 11/13/20   Biagio Borg, MD  losartan (COZAAR) 100 MG tablet Take by mouth. 11/13/20   [provider]  losartan-hydrochlorothiazide (HYZAAR) 100-25 MG tablet Take 1 tablet by  mouth daily. Patient not taking: No sig reported    [provider]  Loteprednol Etabonate (LOTEMAX SM) 0.38 % GEL  INSTILL 1 DROP IN AFFECTED EYE(S) THREE TIMES DAILY 11/29/20   [provider]  metoprolol succinate (TOPROL-XL) 50 MG 24 hr tablet TAKE 1 TABLET BY MOUTH DAILY WITH OR IMMEDIATELY FOLLOWING A MEAL 12/04/20   Biagio Borg, MD  nitroGLYCERIN (NITROSTAT) 0.4 MG SL tablet Place 1 tablet (0.4 mg total) under the tongue every 5 (five) minutes as needed for chest pain. 01/27/20   Ghimire, Henreitta Leber, MD  OCUFLOX 0.3 % ophthalmic solution 1 drop 3 (three) times daily. 12/19/20   [provider]  pantoprazole (PROTONIX) 40 MG tablet TAKE 1 TABLET(40 MG) BY MOUTH TWICE DAILY 11/13/20   Biagio Borg, MD  polyethylene glycol (MIRALAX / GLYCOLAX) 17 g packet Take 17 g by mouth daily as needed. Patient not taking: No sig reported 01/27/20   Jonetta Osgood, MD  traMADol (ULTRAM) 50 MG tablet Take 1 tablet by mouth 2 (two) times daily as needed. 02/08/21   [provider]    Allergies    Ciprofloxacin, Hydrocodone, Hydrocortisone, and Penicillins  Review of Systems   Review of Systems  Constitutional: Negative for chills and fever.  HENT: Negative for ear pain and sore throat.   Eyes: Negative for pain and visual disturbance.  Respiratory: Negative for cough and shortness of breath.   Cardiovascular: Negative for chest pain and palpitations.  Gastrointestinal: Positive for abdominal pain, diarrhea, nausea and vomiting.  Genitourinary: Negative for dysuria and hematuria.  Musculoskeletal: Negative for arthralgias and back pain.  Skin: Negative for color change and rash.  Neurological: Negative for seizures and syncope.  All other systems reviewed and are negative.   Physical Exam Updated Vital Signs  ED Triage Vitals  Enc Vitals Group     BP 03/02/21 1337 (!) 124/92     Pulse Rate 03/02/21 1337 77     Resp 03/02/21 1337 16     Temp 03/02/21 1337 98.2 F (36.8 C)     Temp Source 03/02/21 1337 Oral     SpO2 03/02/21 1337 92 %     Weight 03/02/21 1332 285 lb 0.9 oz (129.3  kg)     Height 03/02/21 1332 '5\' 6"'$  (1.676 m)     Head Circumference --      Peak Flow --      Pain Score 03/02/21 1332 8     Pain Loc --      Pain Edu? --      Excl. in [place]? --     Physical Exam Vitals and nursing note reviewed.  Constitutional:      General: She is not in acute distress.    Appearance: She is well-developed. She is not ill-appearing.  HENT:     Head: Normocephalic and atraumatic.     Mouth/Throat:     Mouth: Mucous membranes are moist.  Eyes:     Extraocular Movements: Extraocular movements intact.     Conjunctiva/sclera: Conjunctivae normal.     Pupils: Pupils are equal, round, and reactive to light.  Cardiovascular:     Rate and Rhythm: Normal rate and regular rhythm.     Heart sounds: Normal heart sounds. No murmur heard.   Pulmonary:     Effort: Pulmonary effort is normal. No respiratory distress.     Breath sounds: Normal breath sounds.  Abdominal:     General: Abdomen is flat. There is no  distension.     Palpations: Abdomen is soft.     Tenderness: There is abdominal tenderness in the left lower quadrant.  Musculoskeletal:     Cervical back: Neck supple.  Skin:    General: Skin is warm and dry.     Capillary Refill: Capillary refill takes less than 2 seconds.  Neurological:     General: No focal deficit present.     Mental Status: She is alert.  Psychiatric:        Mood and Affect: Mood normal.     ED Results / Procedures / Treatments   Labs (all labs ordered are listed, but only abnormal results are displayed) Labs Reviewed  CBC WITH DIFFERENTIAL/PLATELET - Abnormal; Notable for the following components:      Result Value   RBC 3.63 (*)    Hemoglobin 11.6 (*)    All other components within normal limits  COMPREHENSIVE METABOLIC PANEL - Abnormal; Notable for the following components:   Sodium 134 (*)    Glucose, Bld 210 (*)    Creatinine, Ser 1.32 (*)    Total Protein 8.2 (*)    GFR, Estimated 43 (*)    All other components within  normal limits  LIPASE, BLOOD    EKG None  Radiology CT ABDOMEN PELVIS WO CONTRAST  Result Date: 03/02/2021 CLINICAL DATA:  Left lower quadrant pain, vomiting and diarrhea for a week EXAM: CT ABDOMEN AND PELVIS WITHOUT CONTRAST TECHNIQUE: Multidetector CT imaging of the abdomen and pelvis was performed following the standard protocol without IV contrast. COMPARISON:  CT abdomen pelvis 11/09/2020 FINDINGS: Lower chest: Lung bases are clear.  Mitral annular calcifications. Hepatobiliary: No focal liver abnormality is seen. Prior cholecystectomy. Pancreas: Unremarkable. No pancreatic ductal dilatation or surrounding inflammatory changes. Spleen: Normal in size without focal abnormality. Adrenals/Urinary Tract: Adrenal glands are unremarkable. Kidneys are normal, without renal calculi, focal lesion, or hydronephrosis. Bladder is unremarkable. Stomach/Bowel: Stable postsurgical changes of the stomach. No evidence of bowel obstruction. Normal appendix. There are scattered colonic diverticuli. There is focal mild inflammatory change along ascending colon diverticuli. Vascular/Lymphatic: Aortoiliac atherosclerotic disease.  No AAA. Reproductive: Prior hysterectomy. Other: Unchanged fat containing left inguinal hernia. Likely very small fat containing right inguinal hernia as well. Left posterolateral abdominal wall hernia is only fat containing at this time. Small supraumbilical fat containing hernia is unremarkable. Musculoskeletal: Postsurgical changes in the lumbar spine. No acute osseous abnormality. No suspicious lytic or blastic lesions. IMPRESSION: Acute uncomplicated mild diverticulitis along the ascending colon. No focal fluid collection. No evidence of bowel obstruction.  Normal appendix. Electronically Signed   By: Maurine Simmering   On: 03/02/2021 16:47    Procedures Procedures   Medications Ordered in ED Medications  amoxicillin-clavulanate (AUGMENTIN) 875-125 MG per tablet 1 tablet (has no  administration in time range)  sodium chloride 0.9 % bolus 1,000 mL ( Intravenous Stopped 03/02/21 1703)  ondansetron (ZOFRAN) injection 4 mg (4 mg Intravenous Given 03/02/21 1542)  fentaNYL (SUBLIMAZE) injection 50 mcg (50 mcg Intravenous Given 03/02/21 1559)    ED Course  I have reviewed the triage vital signs and the nursing notes.  Pertinent labs & imaging results that were available during my care of the patient were reviewed by me and considered in my medical decision making (see chart for details).    MDM Rules/Calculators/A&P                          FUTURE HAUS is  a 70 year old female with history of diabetes, gastroparesis, diverticulosis, CKD who presents the ED with abdominal pain.  Mostly left lower quadrant pain.  Has had some diarrhea, nausea, vomiting.  No obvious suspicious food intake.  Denies any urinary symptoms.  Has had multiple surgeries.  Could be colitis versus bowel obstruction versus UTI.  Less likely kidney stone.  We will get blood work including CT scan abdomen pelvis.  Will give fluid bolus, Zofran, fentanyl.  Could also be gastroparesis.  She has acute mild diverticulitis on CT scan.  Will treat with Augmentin.  Discussed not using antibiotics but will trial antibiotics given that she has had symptoms for several days now.  She is not toxic appearing.  Lab work is otherwise unremarkable.  Discharged in good condition.  This chart was dictated using voice recognition software.  Despite best efforts to proofread,  errors can occur which can change the documentation meaning.    Final Clinical Impression(s) / ED Diagnoses Final diagnoses:  Acute diverticulitis    Rx / DC Orders ED Discharge Orders         Ordered    amoxicillin-clavulanate (AUGMENTIN) 875-125 MG tablet  2 times daily        03/02/21 1716    ondansetron (ZOFRAN) 4 MG tablet  Every 6 hours        03/02/21 [place], [place], DO 03/02/21 1718

## 2021-03-02 NOTE — ED Notes (Signed)
ED Provider at bedside. 

## 2021-03-02 NOTE — ED Notes (Signed)
Pt aware urine specimen ordered. Pt reports inability to provide specimen at this time. Specimen collection device provided to patient. 

## 2021-03-02 NOTE — ED Triage Notes (Signed)
Abdominal pain, vomiting and diarrhea for a week.

## 2021-03-02 NOTE — ED Notes (Signed)
Pt discharged to home. Discharge instructions have been discussed with patient and/or family members. Pt verbally acknowledges understanding d/c instructions, and endorses comprehension to checkout at registration before leaving.  °

## 2021-03-02 NOTE — ED Notes (Signed)
Pt to restroom on wheelchair

## 2021-03-06 ENCOUNTER — Telehealth: Payer: Self-pay | Admitting: Podiatry

## 2021-03-06 DIAGNOSIS — K5790 Diverticulosis of intestine, part unspecified, without perforation or abscess without bleeding: Secondary | ICD-10-CM | POA: Diagnosis not present

## 2021-03-06 DIAGNOSIS — D631 Anemia in chronic kidney disease: Secondary | ICD-10-CM | POA: Diagnosis not present

## 2021-03-06 DIAGNOSIS — I129 Hypertensive chronic kidney disease with stage 1 through stage 4 chronic kidney disease, or unspecified chronic kidney disease: Secondary | ICD-10-CM | POA: Diagnosis not present

## 2021-03-06 DIAGNOSIS — N183 Chronic kidney disease, stage 3 unspecified: Secondary | ICD-10-CM | POA: Diagnosis not present

## 2021-03-06 NOTE — Telephone Encounter (Signed)
Pt left message on Friday 5.27 @ 1243 stating she would not be able to make her appt as she was heading to the ED.   I returned call today and apologized that I was out of the office but to call and we can get her rescheduled.

## 2021-03-12 ENCOUNTER — Ambulatory Visit (INDEPENDENT_AMBULATORY_CARE_PROVIDER_SITE_OTHER): Payer: Medicare Other | Admitting: Internal Medicine

## 2021-03-12 ENCOUNTER — Encounter: Payer: Self-pay | Admitting: Internal Medicine

## 2021-03-12 ENCOUNTER — Other Ambulatory Visit: Payer: Self-pay

## 2021-03-12 VITALS — BP 108/60 | HR 84 | Temp 98.3°F | Ht 66.0 in | Wt 260.0 lb

## 2021-03-12 DIAGNOSIS — I152 Hypertension secondary to endocrine disorders: Secondary | ICD-10-CM | POA: Diagnosis not present

## 2021-03-12 DIAGNOSIS — R531 Weakness: Secondary | ICD-10-CM

## 2021-03-12 DIAGNOSIS — E1159 Type 2 diabetes mellitus with other circulatory complications: Secondary | ICD-10-CM

## 2021-03-12 DIAGNOSIS — E1165 Type 2 diabetes mellitus with hyperglycemia: Secondary | ICD-10-CM | POA: Diagnosis not present

## 2021-03-12 NOTE — Patient Instructions (Signed)
Due to the weight loss, we can wean off of the clonidine - please only take one per day in the PM for 1 week, then stop completely  Please continue to monitor the BP at home about once per day, write them down and continue to watch for improvement in the lightheadedness and strength which should improved  Please continue all other medications as before, and refills have been done if requested.  Please have the pharmacy call with any other refills you may need.  Please continue your efforts at being more active, low cholesterol diet, and weight control.  Please keep your appointments with your specialists as you may have planned  No lab work needed today  Please return in 3 weeks

## 2021-03-12 NOTE — Assessment & Plan Note (Signed)
Encouraged more ambulation in the home, consider [place] with PT

## 2021-03-12 NOTE — Assessment & Plan Note (Signed)
Lab Results  Component Value Date   HGBA1C 7.8 (A) 12/22/2020   Stable, pt to continue current medical treatment trulicity, basaglar, humalog

## 2021-03-12 NOTE — Progress Notes (Signed)
Patient ID: [name], female   DOB: [birth year], 70 y.o.   MRN: [id number]        Chief Complaint: lightheaded after recent wt loss       HPI:  [name] is a 70 y.o. female here with c/o above, has low wt to 260 recently after being 290 for many years after working on diet but also with intermittent abd pain as well; none today, but BP has been persistently low in the last few days; Pt denies chest pain, increased sob or doe, wheezing, orthopnea, PND, increased LE swelling, palpitations, or syncope.  Pt denies polydipsia, polyuria, or new focal neuro s/s.  Pt has remaining morbid obesity with large fatty tissue deposits to hips and thighs, husband still quite concerned.  Taking multiple vasoactive meds.   Pt denies fever, night sweats, loss of appetite, or other constitutional symptoms  No very recent falls, but still with gait difficulty, using wheelchair at home but has to stand to walk to get into next room due to wheelchair too wide.       Wt Readings from Last 3 Encounters:  03/12/21 260 lb (117.9 kg)  03/02/21 285 lb 0.9 oz (129.3 kg)  01/09/21 285 lb (129.3 kg)   BP Readings from Last 3 Encounters:  03/12/21 108/60  03/02/21 (!) 181/94  02/26/21 (!) 156/103         Past Medical History:  Diagnosis Date  . Anxiety   . Arthritis   . Blood dyscrasia    "FREE BLEEDER"  . CERVICAL RADICULOPATHY, LEFT   . Chronic back pain   . CKD (chronic kidney disease)    DR. SANFORD  [facility]  . COMMON MIGRAINE   . CORONARY ARTERY DISEASE   . Cough    CURRENT COLD  . Cystitis   . DEPRESSION   . Diabetes mellitus, type II ([place])   . DIVERTICULOSIS-COLON   . Dysrhythmia    palpitations  . Gastric ulcer 04/2008  . Gastroparesis   . GERD (gastroesophageal reflux disease)   . Hiatal hernia   . Hyperlipidemia   . Hypertension   . Hypothyroidism   . INSOMNIA-SLEEP DISORDER-UNSPEC   . Iron deficiency anemia   . Wears glasses    Past Surgical History:  Procedure  Laterality Date  . ABDOMINAL HYSTERECTOMY    . ANTERIOR LAT LUMBAR FUSION Left 08/13/2017   Procedure: LEFT SIDED LUMBAR 3-4 LATERAL INTERBODY FUSION WITH INSTRUMENTATION AND ALLOGRAFT;  Surgeon: Phylliss Bob, MD;  Location: [facility];  Service: Orthopedics;  Laterality: Left;  LEFT SIDED LUMBAR 3-4 LATERAL INTERBODY FUSION WITH INSTRUMENTATION AND ALLOGRAFT; REQUEST 3 HOURS  . Back Stimulator  07/2018  . BACK SURGERY  03/2016  . BREAST EXCISIONAL BIOPSY Right    40 years ago  . CHOLECYSTECTOMY  06/2009  . COLONOSCOPY    . ESOPHAGOGASTRODUODENOSCOPY    . EYE SURGERY Bilateral    lasik  . Gastric Wedge resection lipoma  11/2007   x2 with laparotomy and gastrostomy  . JOINT REPLACEMENT    . Left knee replacement    . Rigth knee replacement with revision  04/2008   Dr. Berenice Primas  . ROTATOR CUFF REPAIR Left 01/2009  . s/p bladder surgury  09/2009   Dr. Terance Hart  . SPINAL CORD STIMULATOR INSERTION N/A 09/11/2018   Procedure: LUMBAR SPINAL CORD STIMULATOR INSERTION;  Surgeon: Clydell Hakim, MD;  Location: [facility];  Service: Neurosurgery;  Laterality: N/A;  LUMBAR SPINAL CORD STIMULATOR INSERTION  reports that she has quit smoking. She has never used smokeless tobacco. She reports that she does not drink alcohol and does not use drugs. family history includes Breast cancer (age of onset: 73) in an other family member; Breast cancer (age of onset: 3) in her sister; Breast cancer (age of onset: 11) in her sister; Coronary artery disease in an other family member; Diabetes in her brother, brother, and sister; Heart disease in her brother and sister; Hypertension in an other family member. Allergies  Allergen Reactions  . Ciprofloxacin Shortness Of Breath and Other (See Comments)    Dizziness   . Hydrocodone Shortness Of Breath and Other (See Comments)    Tachycardia  . Hydrocortisone   . Penicillins Hives    Pt just remembers hives, SHE HAS HAD AUGEMTIN WITHOUT ISSUES PER PATIENT no SOB or  lightheadedness with hypotension: No Has patient had a PCN reaction causing severe rash involving mucus membranes or skin necrosis: No Has patient had a PCN reaction that required hospitalization No Has patient had a PCN reaction occurring within the last 10 years: No If all of the above answers are "NO", then may proceed with Cephalosporin use.    Current Outpatient Medications on File Prior to Visit  Medication Sig Dispense Refill  . amLODipine (NORVASC) 5 MG tablet Take 1 tablet by mouth daily.    Marland Kitchen aspirin 81 MG EC tablet Take 81 mg by mouth daily.    Marland Kitchen atorvastatin (LIPITOR) 40 MG tablet TAKE ONE TABLET BY MOUTH EVERY NIGHT AT BEDTIME (Patient taking differently: Take 40 mg by mouth at bedtime.) 90 tablet 3  . cloNIDine (CATAPRES) 0.1 MG tablet TAKE ONE TABLET BY MOUTH TWICE A DAY 180 tablet 0  . Continuous Blood Gluc Receiver (FREESTYLE LIBRE 2 READER) DEVI Use as directed twice daily E11.9 1 each 0  . Continuous Blood Gluc Sensor (FREESTYLE LIBRE 2 SENSOR) MISC Use as directed once bi-weekly E11.9 6 each 3  . Dulaglutide (TRULICITY) 1.5 0000000 SOPN Inject into the skin.    Marland Kitchen ELMIRON 100 MG capsule Take 200 mg by mouth 2 (two) times daily.     Marland Kitchen FLUoxetine (PROZAC) 20 MG capsule TAKE ONE CAPSULE BY MOUTH DAILY 90 capsule 3  . gabapentin (NEURONTIN) 600 MG tablet Take 600 mg by mouth See admin instructions. 1 tab in the am and at noon. 2 hs    . glucose blood (FREESTYLE TEST STRIPS) test strip Use as instructed to check blood sugar 4 times daily. 100 each 3  . glucose blood test strip Use as instructed 100 each 12  . Insulin Glargine (BASAGLAR KWIKPEN) 100 UNIT/ML Inject 30 Units into the skin daily.    . insulin lispro (HUMALOG) 100 UNIT/ML injection Inject 3-20 Units into the skin See admin instructions. Per sliding scale 3 times daily    . isosorbide mononitrate (IMDUR) 60 MG 24 hr tablet Take 1 tablet (60 mg total) by mouth daily. 90 tablet 3  . linaclotide (LINZESS) 72 MCG capsule  Take 1 capsule (72 mcg total) by mouth daily before breakfast. 30 capsule 11  . losartan (COZAAR) 100 MG tablet Take by mouth.    . losartan-hydrochlorothiazide (HYZAAR) 100-25 MG tablet Take 1 tablet by mouth daily.    . Loteprednol Etabonate (LOTEMAX SM) 0.38 % GEL INSTILL 1 DROP IN AFFECTED EYE(S) THREE TIMES DAILY    . metoprolol succinate (TOPROL-XL) 50 MG 24 hr tablet TAKE 1 TABLET BY MOUTH DAILY WITH OR IMMEDIATELY FOLLOWING A MEAL 90 tablet  3  . nitroGLYCERIN (NITROSTAT) 0.4 MG SL tablet Place 1 tablet (0.4 mg total) under the tongue every 5 (five) minutes as needed for chest pain. 20 tablet 0  . OCUFLOX 0.3 % ophthalmic solution 1 drop 3 (three) times daily.    . ondansetron (ZOFRAN) 4 MG tablet Take 1 tablet (4 mg total) by mouth every 6 (six) hours. 12 tablet 0  . pantoprazole (PROTONIX) 40 MG tablet TAKE 1 TABLET(40 MG) BY MOUTH TWICE DAILY 180 tablet 3  . polyethylene glycol (MIRALAX / GLYCOLAX) 17 g packet Take 17 g by mouth daily as needed. 14 each 0  . sodium chloride 0.9 % infusion Inject into the vein.    Marland Kitchen traMADol (ULTRAM) 50 MG tablet Take 1 tablet by mouth 2 (two) times daily as needed.    . chlorhexidine (PERIDEX) 0.12 % solution as directed. (Patient not taking: Reported on 03/12/2021)    . levothyroxine (SYNTHROID) 112 MCG tablet Take 112 mcg by mouth daily.    Marland Kitchen levothyroxine (SYNTHROID) 75 MCG tablet Take 3 tablets (225 mcg total) by mouth daily at 6 (six) AM. 90 tablet 0   No current facility-administered medications on file prior to visit.        ROS:  All others reviewed and negative.  Objective        PE:  BP 108/60 (BP Location: Left Arm, Patient Position: Sitting, Cuff Size: Large)   Pulse 84   Temp 98.3 F (36.8 C) (Oral)   Ht '5\' 6"'$  (1.676 m)   Wt 260 lb (117.9 kg)   SpO2 96%   BMI 41.97 kg/m                 Constitutional: Pt appears in NAD               HENT: Head: NCAT.                Right Ear: External ear normal.                 Left Ear:  External ear normal.                Eyes: . Pupils are equal, round, and reactive to light. Conjunctivae and EOM are normal               Nose: without d/c or deformity               Neck: Neck supple. Gross normal ROM               Cardiovascular: Normal rate and regular rhythm.                 Pulmonary/Chest: Effort normal and breath sounds without rales or wheezing.                Abd:  Soft, NT, ND, + BS, no organomegaly               Neurological: Pt is alert. At baseline orientation, motor grossly intact               Skin: Skin is warm. No rashes, no other new lesions, LE edema - none               Psychiatric: Pt behavior is normal without agitation   Micro: none  Cardiac tracings I have personally interpreted today:  none  Pertinent Radiological findings (summarize): none   Lab Results  Component Value Date   WBC  7.4 03/02/2021   HGB 11.6 (L) 03/02/2021   HCT 36.1 03/02/2021   PLT 236 03/02/2021   GLUCOSE 210 (H) 03/02/2021   CHOL 175 01/26/2020   TRIG 71 01/26/2020   HDL 56 01/26/2020   LDLDIRECT 87.0 01/25/2020   LDLCALC 105 (H) 01/26/2020   ALT 19 03/02/2021   AST 20 03/02/2021   NA 134 (L) 03/02/2021   K 4.3 03/02/2021   CL 100 03/02/2021   CREATININE 1.32 (H) 03/02/2021   BUN 15 03/02/2021   CO2 25 03/02/2021   TSH 39.80 (H) 12/22/2020   INR 1.3 (H) 11/09/2020   HGBA1C 7.8 (A) 12/22/2020   MICROALBUR 1.5 07/06/2020   Assessment/Plan:  LUCYLLE BURKETTE is a 70 y.o. Black or African American [2] female with  has a past medical history of Anxiety, Arthritis, Blood dyscrasia, CERVICAL RADICULOPATHY, LEFT, Chronic back pain, CKD (chronic kidney disease), COMMON MIGRAINE, CORONARY ARTERY DISEASE, Cough, Cystitis, DEPRESSION, Diabetes mellitus, type II ([place]), DIVERTICULOSIS-COLON, Dysrhythmia, Gastric ulcer (04/2008), Gastroparesis, GERD (gastroesophageal reflux disease), Hiatal hernia, Hyperlipidemia, Hypertension, Hypothyroidism, INSOMNIA-SLEEP DISORDER-UNSPEC, Iron  deficiency anemia, and Wears glasses.  Hypertension associated with diabetes ([place]) With recent wt loss appears to be now overcontrolled, to wean off clonidine by taking one qd for 1 wk, then stop.   BP Readings from Last 3 Encounters:  03/12/21 108/60  03/02/21 (!) 181/94  02/26/21 (!) 156/103      Weakness Encouraged more ambulation in the home, consider HH with PT  Diabetes ([place]) Lab Results  Component Value Date   HGBA1C 7.8 (A) 12/22/2020   Stable, pt to continue current medical treatment trulicity, basaglar, humalog   Followup: Return in about 3 weeks (around 04/02/2021).  Cathlean Cower, MD 03/12/2021 10:29 PM [facility]

## 2021-03-12 NOTE — Assessment & Plan Note (Signed)
With recent wt loss appears to be now overcontrolled, to wean off clonidine by taking one qd for 1 wk, then stop.   BP Readings from Last 3 Encounters:  03/12/21 108/60  03/02/21 (!) 181/94  02/26/21 (!) 156/103

## 2021-03-13 ENCOUNTER — Telehealth: Payer: Self-pay | Admitting: Endocrinology

## 2021-03-13 DIAGNOSIS — I10 Essential (primary) hypertension: Secondary | ICD-10-CM | POA: Diagnosis not present

## 2021-03-13 DIAGNOSIS — N189 Chronic kidney disease, unspecified: Secondary | ICD-10-CM | POA: Diagnosis not present

## 2021-03-13 DIAGNOSIS — I208 Other forms of angina pectoris: Secondary | ICD-10-CM | POA: Diagnosis not present

## 2021-03-13 DIAGNOSIS — E119 Type 2 diabetes mellitus without complications: Secondary | ICD-10-CM | POA: Diagnosis not present

## 2021-03-13 DIAGNOSIS — M5416 Radiculopathy, lumbar region: Secondary | ICD-10-CM | POA: Diagnosis not present

## 2021-03-13 DIAGNOSIS — E785 Hyperlipidemia, unspecified: Secondary | ICD-10-CM | POA: Diagnosis not present

## 2021-03-13 NOTE — Telephone Encounter (Signed)
Patient's husband [name] requests copies of the [place] that was filled out by Dr. Dwyane Dee to be sent to Patient or call Patient/[name] at ph# [phone].

## 2021-03-16 ENCOUNTER — Telehealth: Payer: Self-pay | Admitting: Internal Medicine

## 2021-03-16 NOTE — Telephone Encounter (Signed)
The last cbc was very mild anemia only may 2022, and the iron level was normal mar 2022, so it is unlikely that this is related to anemia or low iron  Please continue to monitor for any fever, chills, or other unusual symptoms

## 2021-03-16 NOTE — Telephone Encounter (Signed)
Called both numbers on patients account to let the patient's husband know   The last cbc was very mild anemia only may 2022, and the iron level was normal mar 2022, so it is unlikely that this is related to anemia or low iron   Please continue to monitor for any fever, chills, or other unusual symptoms

## 2021-03-16 NOTE — Telephone Encounter (Signed)
Patients husband called and said that the patient has been staying cold. He was wondering if she needed a blood transfusion or if Dr. Jenny Reichmann could prescribe and iron pill. Please advise. He can be reached at [phone].

## 2021-03-21 ENCOUNTER — Emergency Department (HOSPITAL_BASED_OUTPATIENT_CLINIC_OR_DEPARTMENT_OTHER)
Admission: EM | Admit: 2021-03-21 | Discharge: 2021-03-21 | Disposition: A | Discharge status: Home / Self Care | Payer: Medicare Other | Attending: Emergency Medicine | Admitting: Emergency Medicine

## 2021-03-21 ENCOUNTER — Encounter (HOSPITAL_BASED_OUTPATIENT_CLINIC_OR_DEPARTMENT_OTHER): Payer: Self-pay | Admitting: *Deleted

## 2021-03-21 ENCOUNTER — Other Ambulatory Visit: Payer: Self-pay

## 2021-03-21 ENCOUNTER — Emergency Department (HOSPITAL_BASED_OUTPATIENT_CLINIC_OR_DEPARTMENT_OTHER): Payer: Medicare Other

## 2021-03-21 DIAGNOSIS — K5792 Diverticulitis of intestine, part unspecified, without perforation or abscess without bleeding: Secondary | ICD-10-CM | POA: Diagnosis present

## 2021-03-21 DIAGNOSIS — E1169 Type 2 diabetes mellitus with other specified complication: Secondary | ICD-10-CM | POA: Insufficient documentation

## 2021-03-21 DIAGNOSIS — Z20822 Contact with and (suspected) exposure to covid-19: Secondary | ICD-10-CM | POA: Insufficient documentation

## 2021-03-21 DIAGNOSIS — E039 Hypothyroidism, unspecified: Secondary | ICD-10-CM | POA: Diagnosis not present

## 2021-03-21 DIAGNOSIS — E1122 Type 2 diabetes mellitus with diabetic chronic kidney disease: Secondary | ICD-10-CM | POA: Insufficient documentation

## 2021-03-21 DIAGNOSIS — I700 Atherosclerosis of aorta: Secondary | ICD-10-CM | POA: Diagnosis not present

## 2021-03-21 DIAGNOSIS — I129 Hypertensive chronic kidney disease with stage 1 through stage 4 chronic kidney disease, or unspecified chronic kidney disease: Secondary | ICD-10-CM | POA: Diagnosis not present

## 2021-03-21 DIAGNOSIS — K5732 Diverticulitis of large intestine without perforation or abscess without bleeding: Secondary | ICD-10-CM | POA: Diagnosis not present

## 2021-03-21 DIAGNOSIS — I2510 Atherosclerotic heart disease of native coronary artery without angina pectoris: Secondary | ICD-10-CM | POA: Diagnosis not present

## 2021-03-21 DIAGNOSIS — K219 Gastro-esophageal reflux disease without esophagitis: Secondary | ICD-10-CM | POA: Diagnosis not present

## 2021-03-21 DIAGNOSIS — E785 Hyperlipidemia, unspecified: Secondary | ICD-10-CM | POA: Insufficient documentation

## 2021-03-21 DIAGNOSIS — R55 Syncope and collapse: Secondary | ICD-10-CM | POA: Diagnosis not present

## 2021-03-21 DIAGNOSIS — N184 Chronic kidney disease, stage 4 (severe): Secondary | ICD-10-CM | POA: Diagnosis not present

## 2021-03-21 DIAGNOSIS — R1110 Vomiting, unspecified: Secondary | ICD-10-CM | POA: Diagnosis not present

## 2021-03-21 DIAGNOSIS — Z7982 Long term (current) use of aspirin: Secondary | ICD-10-CM | POA: Diagnosis not present

## 2021-03-21 DIAGNOSIS — R42 Dizziness and giddiness: Secondary | ICD-10-CM | POA: Insufficient documentation

## 2021-03-21 DIAGNOSIS — E1142 Type 2 diabetes mellitus with diabetic polyneuropathy: Secondary | ICD-10-CM | POA: Diagnosis not present

## 2021-03-21 DIAGNOSIS — Z96653 Presence of artificial knee joint, bilateral: Secondary | ICD-10-CM | POA: Diagnosis not present

## 2021-03-21 DIAGNOSIS — E1143 Type 2 diabetes mellitus with diabetic autonomic (poly)neuropathy: Secondary | ICD-10-CM | POA: Insufficient documentation

## 2021-03-21 DIAGNOSIS — Z7984 Long term (current) use of oral hypoglycemic drugs: Secondary | ICD-10-CM | POA: Insufficient documentation

## 2021-03-21 DIAGNOSIS — Z79899 Other long term (current) drug therapy: Secondary | ICD-10-CM | POA: Diagnosis not present

## 2021-03-21 DIAGNOSIS — R000 Tachycardia, unspecified: Secondary | ICD-10-CM | POA: Diagnosis not present

## 2021-03-21 DIAGNOSIS — Z794 Long term (current) use of insulin: Secondary | ICD-10-CM | POA: Diagnosis not present

## 2021-03-21 DIAGNOSIS — R109 Unspecified abdominal pain: Secondary | ICD-10-CM | POA: Diagnosis not present

## 2021-03-21 DIAGNOSIS — R531 Weakness: Secondary | ICD-10-CM | POA: Insufficient documentation

## 2021-03-21 LAB — BASIC METABOLIC PANEL
Anion gap: 12 (ref 5–15)
BUN: 14 mg/dL (ref 8–23)
CO2: 25 mmol/L (ref 22–32)
Calcium: 9.9 mg/dL (ref 8.9–10.3)
Chloride: 100 mmol/L (ref 98–111)
Creatinine, Ser: 1.16 mg/dL — ABNORMAL HIGH (ref 0.44–1.00)
GFR, Estimated: 50 mL/min — ABNORMAL LOW (ref >60)
Glucose, Bld: 264 mg/dL — ABNORMAL HIGH (ref 70–99)
Potassium: 3.8 mmol/L (ref 3.5–5.1)
Sodium: 137 mmol/L (ref 135–145)

## 2021-03-21 LAB — URINALYSIS, ROUTINE W REFLEX MICROSCOPIC
Bilirubin Urine: NEGATIVE
Glucose, UA: 500 mg/dL — AB
Hgb urine dipstick: NEGATIVE
Ketones, ur: 15 mg/dL — AB
Leukocytes,Ua: NEGATIVE
Nitrite: NEGATIVE
Specific Gravity, Urine: 1.021 (ref 1.005–1.030)
pH: 7 (ref 5.0–8.0)

## 2021-03-21 LAB — CBC
HCT: 38.3 % (ref 36.0–46.0)
Hemoglobin: 12.2 g/dL (ref 12.0–15.0)
MCH: 31.3 pg (ref 26.0–34.0)
MCHC: 31.9 g/dL (ref 30.0–36.0)
MCV: 98.2 fL (ref 80.0–100.0)
Platelets: 266 10*3/uL (ref 150–400)
RBC: 3.9 MIL/uL (ref 3.87–5.11)
RDW: 13.1 % (ref 11.5–15.5)
WBC: 8.6 10*3/uL (ref 4.0–10.5)
nRBC: 0 % (ref 0.0–0.2)

## 2021-03-21 LAB — RESP PANEL BY RT-PCR (FLU A&B, COVID) ARPGX2
Influenza A by PCR: NEGATIVE
Influenza B by PCR: NEGATIVE
SARS Coronavirus 2 by RT PCR: NEGATIVE

## 2021-03-21 LAB — CBG MONITORING, ED: Glucose-Capillary: 253 mg/dL — ABNORMAL HIGH (ref 70–99)

## 2021-03-21 MED ORDER — SODIUM CHLORIDE 0.9 % IV BOLUS
500.0000 mL | Freq: Once | INTRAVENOUS | Status: AC
  Administered 2021-03-21: 500 mL via INTRAVENOUS

## 2021-03-21 MED ORDER — SODIUM CHLORIDE 0.9 % IV SOLN
1.0000 g | Freq: Once | INTRAVENOUS | Status: AC
  Administered 2021-03-21: 1 g via INTRAVENOUS
  Filled 2021-03-21: qty 10

## 2021-03-21 MED ORDER — AMOXICILLIN-POT CLAVULANATE 875-125 MG PO TABS: 1.0000 | ORAL_TABLET | Freq: Two times a day (BID) | ORAL | 0 refills | Status: AC

## 2021-03-21 MED ORDER — ONDANSETRON 4 MG PO TBDP: 4.0000 mg | ORAL_TABLET | Freq: Three times a day (TID) | ORAL | 0 refills | Status: DC | PRN

## 2021-03-21 MED ORDER — FENTANYL CITRATE (PF) 100 MCG/2ML IJ SOLN
50.0000 ug | INTRAMUSCULAR | Status: DC | PRN
  Administered 2021-03-21: 50 ug via INTRAVENOUS
  Filled 2021-03-21: qty 2

## 2021-03-21 MED ORDER — IOHEXOL 300 MG/ML  SOLN
100.0000 mL | Freq: Once | INTRAMUSCULAR | Status: AC | PRN
  Administered 2021-03-21: 100 mL via INTRAVENOUS

## 2021-03-21 MED ORDER — METRONIDAZOLE 500 MG/100ML IV SOLN
500.0000 mg | Freq: Once | INTRAVENOUS | Status: AC
  Administered 2021-03-21: 500 mg via INTRAVENOUS
  Filled 2021-03-21: qty 100

## 2021-03-21 NOTE — ED Notes (Signed)
States has had left lower abdominal pain onset Sunday.  Nausea and vomiting.

## 2021-03-21 NOTE — ED Provider Notes (Addendum)
Patient admitted to Dr Roosevelt Locks hospitalist.   [name], MD 03/21/21 1653  Update 520 pm -I was called back into the room by the patient and her family.  They report that they really wish to go home.  The patient was able to tolerate p.o. fluids.  She is finishing her IV antibiotics at this time.  I reviewed her labs and her work-up.  Her vitals been stable throughout her visit.  Her numbers do not show any significant dehydration or leukocytosis.  I think it would be reasonable to treat this at home as a mild acute diverticulitis.  Particularly if she is able to keep down fluids and wants to go home.  She will arrange for follow-up with her PCP this week.  Given that she has allergies to ciprofloxacin, I think most reasonable option be Augmentin.  I confirmed with the patient and with her medical chart that she has had Augmentin before and has not had an adverse reaction to it.   [name], MD 03/21/21 Jamal Maes    [name], MD 03/21/21 1730

## 2021-03-21 NOTE — ED Notes (Signed)
Patient transported to CT 

## 2021-03-21 NOTE — ED Triage Notes (Signed)
States she has had left flank pain since yesterday and feeling faint since last night. Pt has not had much to eat or drink since Sunday. No diarrhea, Vomiting this am.

## 2021-03-21 NOTE — ED Provider Notes (Signed)
[facility] EMERGENCY DEPT Provider Note   CSN: [id number] Arrival date & time: 03/21/21  1043     History Chief Complaint  Patient presents with   Flank Pain   Near Syncope    [name] is a 70 y.o. female.  Patient with history of chronic kidney disease, coronary artery disease presents with worsening left abdominal pain, lightheadedness nausea and vomiting since yesterday. Patient's had mild episode of vomiting since last night and worsening left-sided pain.  Different than history.  Patient denies history of diverticulitis.  Patient has history of gastric ulcer and gastroparesis and reflux., High blood pressure     Past Medical History:  Diagnosis Date   Anxiety    Arthritis    Blood dyscrasia    "FREE BLEEDER"   CERVICAL RADICULOPATHY, LEFT    Chronic back pain    CKD (chronic kidney disease)    DR. SANFORD  [facility]   COMMON MIGRAINE    CORONARY ARTERY DISEASE    Cough    CURRENT COLD   Cystitis    DEPRESSION    Diabetes mellitus, type II ([place])    DIVERTICULOSIS-COLON    Dysrhythmia    palpitations   Gastric ulcer 04/2008   Gastroparesis    GERD (gastroesophageal reflux disease)    Hiatal hernia    Hyperlipidemia    Hypertension    Hypothyroidism    INSOMNIA-SLEEP DISORDER-UNSPEC    Iron deficiency anemia    Wears glasses     Patient Active Problem List   Diagnosis Date Noted   Degenerative disc disease, cervical 02/08/2021   Acquired hypothyroidism 11/24/2020   Chronic pain 11/24/2020   Severe sepsis ([place]) 11/09/2020   Hyperlipidemia associated with type 2 diabetes mellitus ([place]) 99991111   Acute metabolic encephalopathy 99991111   Acute lower UTI 11/09/2020   Recurrent falls 10/19/2020   Gait disorder 10/11/2020   Fall 10/11/2020   RLQ abdominal pain 05/05/2020   Low grade fever 02/04/2020   Chest pain 01/26/2020   Lumbar post-laminectomy syndrome 10/05/2019   Spinal cord stimulator status 10/05/2019   Body  mass index (BMI) 40.0-44.9, adult ([place]) 10/05/2019   Rectal bleeding 09/01/2019   History of peptic ulcer disease 09/01/2019   Screening for viral disease 09/01/2019   Insulin dependent type 2 diabetes mellitus ([place]) 09/01/2019   Pyelonephritis 06/05/2019   Recurrent UTI 06/05/2019   Urinary incontinence 06/05/2019   Nausea & vomiting 02/08/2019   Abdominal pain 02/08/2019   Constipation 02/08/2019   UTI (urinary tract infection) 02/08/2019   LUQ pain 01/21/2019   Cervical radiculopathy at C6 01/28/2018   Left lateral epicondylitis 01/23/2018   Obesity 01/23/2018   Weakness 05/01/2017   Hyperkalemia 05/01/2017   Acute kidney injury superimposed on CKD ([place]) 05/01/2017   Essential (primary) hypertension 05/01/2017   Sepsis ([place]) 05/01/2017   Dysuria 04/23/2017   Right leg swelling 12/27/2016   Leg pain, lateral, right 11/05/2016   Radiculopathy 04/03/2016   Hypersomnolence 11/23/2015   Right sided abdominal pain 07/25/2015   Malaise and fatigue 11/05/2014   Rotator cuff syndrome 07/26/2014   Fracture of fifth metacarpal bone of right hand 07/08/2014   Right cervical radiculopathy 06/27/2014   Chronic interstitial cystitis 05/23/2014   Osteoarthritis of CMC joint of thumb 04/11/2014   De Quervain's tenosynovitis, left 03/14/2014   CKD (chronic kidney disease) stage 4, GFR 15-29 ml/min ([facility]) 03/03/2014   Lower back pain 03/03/2014   Ingrown nail 03/03/2014   Peripheral edema 02/23/2014   Lateral  epicondylitis of right elbow 01/31/2014   Neck pain on left side 11/19/2013   Abdominal discomfort 10/12/2012   Right lumbar radiculopathy 05/11/2012   Peripheral neuropathy 05/11/2012   Vertigo 03/29/2012   Headache(784.0) 03/10/2012   Lumbar radiculopathy 10/24/2011   Other postablative hypothyroidism 08/19/2011   Right leg weakness 04/04/2011   Hematochezia 01/07/2011   Preventative health care 01/07/2011   CHOLELITHIASIS 05/04/2009   GERD 04/26/2009   Gastroparesis  04/26/2009   ULCER-GASTRIC 03/15/2009   DIVERTICULOSIS-COLON 03/15/2009   Cervical radiculopathy 10/28/2008   MENOPAUSAL DISORDER 06/29/2008   Pain in joint, shoulder region 10/12/2007   INSOMNIA-SLEEP DISORDER-UNSPEC 09/01/2007   Depression 09/01/2007   ANEMIA-NOS 08/13/2007   COMMON MIGRAINE 08/13/2007   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   Diabetes ([place]) 05/06/2007   HLD (hyperlipidemia) 05/06/2007   ANXIETY 05/06/2007   Hypertension associated with diabetes ([place]) 05/06/2007   Coronary atherosclerosis 05/06/2007    Past Surgical History:  Procedure Laterality Date   ABDOMINAL HYSTERECTOMY     ANTERIOR LAT LUMBAR FUSION Left 08/13/2017   Procedure: LEFT SIDED LUMBAR 3-4 LATERAL INTERBODY FUSION WITH INSTRUMENTATION AND ALLOGRAFT;  Surgeon: Phylliss Bob, MD;  Location: [facility];  Service: Orthopedics;  Laterality: Left;  LEFT SIDED LUMBAR 3-4 LATERAL INTERBODY FUSION WITH INSTRUMENTATION AND ALLOGRAFT; REQUEST 3 HOURS   Back Stimulator  07/2018   BACK SURGERY  03/2016   BREAST EXCISIONAL BIOPSY Right    40 years ago   CHOLECYSTECTOMY  06/2009   COLONOSCOPY     ESOPHAGOGASTRODUODENOSCOPY     EYE SURGERY Bilateral    lasik   Gastric Wedge resection lipoma  11/2007   x2 with laparotomy and gastrostomy   JOINT REPLACEMENT     Left knee replacement     Rigth knee replacement with revision  04/2008   Dr. Berenice Primas   ROTATOR CUFF REPAIR Left 01/2009   s/p bladder surgury  09/2009   Dr. Terance Hart   SPINAL CORD STIMULATOR INSERTION N/A 09/11/2018   Procedure: LUMBAR SPINAL CORD STIMULATOR INSERTION;  Surgeon: Clydell Hakim, MD;  Location: [facility];  Service: Neurosurgery;  Laterality: N/A;  LUMBAR SPINAL CORD STIMULATOR INSERTION     OB History     Gravida  3   Para  3   Term      Preterm      AB      Living  3      SAB      IAB      Ectopic      Multiple      Live Births              Family History  Problem Relation Age of Onset   Diabetes Sister        x 3    Heart disease Sister        x2   Breast cancer Sister 40   Diabetes Brother        x3   Heart disease Brother        x2   Coronary artery disease Other        female 1st degree   Hypertension Other    Breast cancer Sister 20   Breast cancer Other 25   Diabetes Brother    Colon cancer Neg Hx     Social History   Tobacco Use   Smoking status: Former    Pack years: 0.00   Smokeless tobacco: Never   Tobacco comments:    quit 30 years ago  58  Use   Vaping Use: Never used  Substance Use Topics   Alcohol use: No    Alcohol/week: 0.0 standard drinks   Drug use: No    Home Medications Prior to Admission medications   Medication Sig Start Date End Date Taking? Authorizing Provider  amLODipine (NORVASC) 5 MG tablet Take 1 tablet by mouth daily. 12/12/20  Yes [provider]  aspirin 81 MG EC tablet Take 81 mg by mouth daily.   Yes [provider]  atorvastatin (LIPITOR) 40 MG tablet TAKE ONE TABLET BY MOUTH EVERY NIGHT AT BEDTIME Patient taking differently: Take 40 mg by mouth at bedtime. 06/13/20  Yes Biagio Borg, MD  Continuous Blood Gluc Receiver (FREESTYLE LIBRE 2 READER) DEVI Use as directed twice daily E11.9 10/11/20  Yes Biagio Borg, MD  Continuous Blood Gluc Sensor (FREESTYLE LIBRE 2 SENSOR) MISC Use as directed once bi-weekly E11.9 10/11/20  Yes Biagio Borg, MD  Dulaglutide (TRULICITY) 1.5 0000000 SOPN Inject into the skin. 02/02/21  Yes [provider]  ELMIRON 100 MG capsule Take 200 mg by mouth 2 (two) times daily.  07/20/13  Yes [provider]  FLUoxetine (PROZAC) 20 MG capsule TAKE ONE CAPSULE BY MOUTH DAILY Patient taking differently: Take 20 mg by mouth daily. TAKE ONE CAPSULE BY MOUTH DAILY 11/13/20  Yes Biagio Borg, MD  gabapentin (NEURONTIN) 600 MG tablet Take 600 mg by mouth See admin instructions. 1 tab in the am and at noon. 2 hs 01/24/20  Yes [provider]  glucose blood (FREESTYLE TEST STRIPS) test strip Use  as instructed to check blood sugar 4 times daily. 04/20/18  Yes Elayne Snare, MD  Insulin Glargine [facility] KWIKPEN) 100 UNIT/ML Inject 30 Units into the skin daily. 02/02/21  Yes [provider]  insulin lispro (HUMALOG) 100 UNIT/ML injection Inject 3-20 Units into the skin See admin instructions. Per sliding scale 3 times daily   Yes [provider]  isosorbide mononitrate (IMDUR) 60 MG 24 hr tablet Take 1 tablet (60 mg total) by mouth daily. 02/21/20  Yes Biagio Borg, MD  levothyroxine (SYNTHROID) 112 MCG tablet Take 112 mcg by mouth daily. 02/19/21  Yes [provider]  linaclotide Rolan Lipa) 72 MCG capsule Take 1 capsule (72 mcg total) by mouth daily before breakfast. 11/13/20  Yes Biagio Borg, MD  metoprolol succinate (TOPROL-XL) 50 MG 24 hr tablet TAKE 1 TABLET BY MOUTH DAILY WITH OR IMMEDIATELY FOLLOWING A MEAL 12/04/20  Yes Biagio Borg, MD  ondansetron (ZOFRAN) 4 MG tablet Take 1 tablet (4 mg total) by mouth every 6 (six) hours. 03/02/21  Yes Curatolo, Adam, DO  pantoprazole (PROTONIX) 40 MG tablet TAKE 1 TABLET(40 MG) BY MOUTH TWICE DAILY 11/13/20  Yes Biagio Borg, MD  polyethylene glycol (MIRALAX / GLYCOLAX) 17 g packet Take 17 g by mouth daily as needed. 01/27/20  Yes Ghimire, Henreitta Leber, MD  traMADol (ULTRAM) 50 MG tablet Take 1 tablet by mouth 2 (two) times daily as needed. 02/08/21  Yes [provider]  cloNIDine (CATAPRES) 0.1 MG tablet TAKE ONE TABLET BY MOUTH TWICE A DAY Patient not taking: No sig reported 03/01/21   Biagio Borg, MD  glucose blood test strip Use as instructed 12/21/20   Elayne Snare, MD  levothyroxine (SYNTHROID) 75 MCG tablet Take 3 tablets (225 mcg total) by mouth daily at 6 (six) AM. 11/12/20 12/12/20  Barb Merino, MD  Loteprednol Etabonate (LOTEMAX SM) 0.38 % GEL INSTILL 1 DROP  IN AFFECTED EYE(S) THREE TIMES DAILY Patient not taking: No sig reported 11/29/20   [provider]  nitroGLYCERIN (NITROSTAT) 0.4 MG SL tablet Place 1  tablet (0.4 mg total) under the tongue every 5 (five) minutes as needed for chest pain. 01/27/20   Ghimire, Henreitta Leber, MD    Allergies    Ciprofloxacin, Hydrocodone, Hydrocortisone, and Penicillins  Review of Systems   Review of Systems  Constitutional:  Negative for chills and fever.  HENT:  Negative for congestion.   Eyes:  Negative for visual disturbance.  Respiratory:  Negative for shortness of breath.   Cardiovascular:  Negative for chest pain.  Gastrointestinal:  Positive for abdominal pain, nausea and vomiting.  Genitourinary:  Negative for dysuria and flank pain.  Musculoskeletal:  Negative for back pain, neck pain and neck stiffness.  Skin:  Negative for rash.  Neurological:  Positive for light-headedness. Negative for headaches.   Physical Exam Updated Vital Signs BP 118/64   Pulse 80   Temp 98.2 F (36.8 C)   Resp 14   Ht '5\' 6"'$  (1.676 m)   Wt 117.9 kg   SpO2 100%   BMI 41.97 kg/m   Physical Exam Vitals and nursing note reviewed.  Constitutional:      General: She is not in acute distress.    Appearance: She is well-developed.  HENT:     Head: Normocephalic and atraumatic.     Mouth/Throat:     Mouth: Mucous membranes are dry.  Eyes:     General:        Right eye: No discharge.        Left eye: No discharge.     Conjunctiva/sclera: Conjunctivae normal.  Neck:     Trachea: No tracheal deviation.  Cardiovascular:     Rate and Rhythm: Normal rate and regular rhythm.     Heart sounds: No murmur heard. Pulmonary:     Effort: Pulmonary effort is normal.     Breath sounds: Normal breath sounds.  Abdominal:     General: There is no distension.     Palpations: Abdomen is soft.     Tenderness: There is abdominal tenderness (left abdomen). There is no guarding.  Musculoskeletal:     Cervical back: Normal range of motion and neck supple. No rigidity.  Skin:    General: Skin is warm.     Capillary Refill: Capillary refill takes less than 2 seconds.      Findings: No rash.  Neurological:     General: No focal deficit present.     Mental Status: She is alert.     Cranial Nerves: No cranial nerve deficit.  Psychiatric:        Mood and Affect: Mood normal.    ED Results / Procedures / Treatments   Labs (all labs ordered are listed, but only abnormal results are displayed) Labs Reviewed  URINALYSIS, ROUTINE W REFLEX MICROSCOPIC - Abnormal; Notable for the following components:      Result Value   Glucose, UA 500 (*)    Ketones, ur 15 (*)    Protein, ur TRACE (*)    All other components within normal limits  BASIC METABOLIC PANEL - Abnormal; Notable for the following components:   Glucose, Bld 264 (*)    Creatinine, Ser 1.16 (*)    GFR, Estimated 50 (*)    All other components within normal limits  CBG MONITORING, ED - Abnormal; Notable for the following components:   Glucose-Capillary 253 (*)  All other components within normal limits  RESP PANEL BY RT-PCR (FLU A&B, COVID) ARPGX2  CBC  CBG MONITORING, ED    EKG EKG Interpretation  Date/Time:  Wednesday March 21 2021 11:18:22 EDT Ventricular Rate:  117 PR Interval:  148 QRS Duration: 88 QT Interval:  346 QTC Calculation: 482 R Axis:   58 Text Interpretation: Sinus tachycardia Otherwise normal ECG Confirmed by Elnora Morrison [phone]) on 03/21/2021 12:34:44 PM  Radiology CT ABDOMEN PELVIS W CONTRAST  Result Date: 03/21/2021 CLINICAL DATA:  Abdominal pain and vomiting. EXAM: CT ABDOMEN AND PELVIS WITH CONTRAST TECHNIQUE: Multidetector CT imaging of the abdomen and pelvis was performed using the standard protocol following bolus administration of intravenous contrast. CONTRAST:  131m OMNIPAQUE IOHEXOL 300 MG/ML  SOLN COMPARISON:  Mar 02, 2021 FINDINGS: Lower chest: Spinal stimulator in place. Hepatobiliary: No focal liver abnormality is seen. Status post cholecystectomy. No biliary dilatation. Pancreas: Unremarkable. No pancreatic ductal dilatation or surrounding inflammatory  changes. Spleen: Normal in size without focal abnormality. Adrenals/Urinary Tract: Adrenal glands are unremarkable. Kidneys are normal, without renal calculi, focal lesion, or hydronephrosis. Bladder is unremarkable. Stomach/Bowel: Stable postsurgical changes of the stomach. No evidence of appendicitis. No evidence of small bowel wall thickening, distention, or inflammatory changes. Mild circumferential mucosal thickening of the distal descending colon, on the background of scattered diverticulosis may represent mild acute diverticulitis. No evidence of complicating features. Vascular/Lymphatic: Aortic atherosclerosis. No enlarged abdominal or pelvic lymph nodes. Reproductive: Status post hysterectomy. No adnexal masses. Other: Left lateral abdominal wall hernia containing fat is again seen. Fat containing left inguinal hernia is also seen. No abdominopelvic ascites. Musculoskeletal: No acute or significant osseous findings. IMPRESSION: 1. Mild circumferential mucosal thickening of the distal descending colon, on the background of scattered diverticulosis may represent mild acute diverticulitis. No evidence of complicating features. 2. No evidence of acute abnormalities within the solid abdominal organs. 3. Aortic atherosclerosis. Aortic Atherosclerosis (ICD10-I70.0). Electronically Signed   By: DFidela SalisburyM.D.   On: 03/21/2021 15:09    Procedures Procedures   Medications Ordered in ED Medications  fentaNYL (SUBLIMAZE) injection 50 mcg (has no administration in time range)  cefTRIAXone (ROCEPHIN) 1 g in sodium chloride 0.9 % 100 mL IVPB (has no administration in time range)  metroNIDAZOLE (FLAGYL) IVPB 500 mg (has no administration in time range)  sodium chloride 0.9 % bolus 500 mL (has no administration in time range)  sodium chloride 0.9 % bolus 500 mL (0 mLs Intravenous Stopped 03/21/21 1526)  iohexol (OMNIPAQUE) 300 MG/ML solution 100 mL (100 mLs Intravenous Contrast Given 03/21/21 1431)     ED Course  I have reviewed the triage vital signs and the nursing notes.  Pertinent labs & imaging results that were available during my care of the patient were reviewed by me and considered in my medical decision making (see chart for details).    MDM Rules/Calculators/A&P                          Patient presents with worsening left abdominal pain, vomiting and general weakness.  Clinical concern for diverticulitis versus kidney stone versus urine infection.  Patient clinically dehydrated, IV fluid bolus ordered, heart rate did improve.  On reassessment pain persists and patient generally weak.  Family later called saying it is hard for her to get out of bed and do normal activities due to this.  Blood work reviewed showing mild chronic kidney disease 1.1 creatinine, normal hemoglobin, normal white blood  cell count.  CT scan results revealed diverticulitis without perforation.  Patient care be signed out to continue to monitor and with patient general weakness, age, near syncope will need observation overnight IV antibiotics ordered. Final Clinical Impression(s) / ED Diagnoses Final diagnoses:  Acute diverticulitis  Pre-syncope  General weakness    Rx / DC Orders ED Discharge Orders     None        Elnora Morrison, MD 03/21/21 1556

## 2021-03-21 NOTE — ED Notes (Signed)
Repeat Lt green tube drawn and sent to lab

## 2021-03-21 NOTE — Discharge Instructions (Signed)
Your next dose of antibiotic which is called Augmentin is due tonight before bedtime.  Please try to take this with a small amount of food.  Please take the full course of antibiotics for 7 days.  It can cause a little stomach upset, but should help clear up the infection in your belly.  Please call to schedule a follow-up appointment with your primary care provider in 3 days.

## 2021-03-23 DIAGNOSIS — M19011 Primary osteoarthritis, right shoulder: Secondary | ICD-10-CM | POA: Diagnosis not present

## 2021-03-26 ENCOUNTER — Telehealth: Payer: Self-pay | Admitting: Internal Medicine

## 2021-03-26 NOTE — Telephone Encounter (Signed)
Type of form received: FMLA paperwork   Additional comments:   Received by:  - faxed  Form should be Faxed to: [phone] or [phone]  Form should be mailed to:    N/A   Is patient requesting call for pickup:  N/A  Form placed in the Provider's box.   Attach charge sheet.  Provider will determine charge  Was patient informed of  7-10 business day turn around (Y/N)?  no

## 2021-03-27 NOTE — Telephone Encounter (Signed)
Paperwork will be completed at upcoming visit by Dr. Jenny Reichmann.

## 2021-03-29 ENCOUNTER — Encounter: Payer: Self-pay | Admitting: Internal Medicine

## 2021-03-29 ENCOUNTER — Other Ambulatory Visit: Payer: Self-pay

## 2021-03-29 ENCOUNTER — Ambulatory Visit (INDEPENDENT_AMBULATORY_CARE_PROVIDER_SITE_OTHER): Payer: Medicare Other | Admitting: Internal Medicine

## 2021-03-29 VITALS — BP 140/82 | HR 98 | Temp 97.9°F

## 2021-03-29 DIAGNOSIS — N184 Chronic kidney disease, stage 4 (severe): Secondary | ICD-10-CM

## 2021-03-29 DIAGNOSIS — I152 Hypertension secondary to endocrine disorders: Secondary | ICD-10-CM | POA: Diagnosis not present

## 2021-03-29 DIAGNOSIS — E1165 Type 2 diabetes mellitus with hyperglycemia: Secondary | ICD-10-CM

## 2021-03-29 DIAGNOSIS — E1159 Type 2 diabetes mellitus with other circulatory complications: Secondary | ICD-10-CM

## 2021-03-29 DIAGNOSIS — K5792 Diverticulitis of intestine, part unspecified, without perforation or abscess without bleeding: Secondary | ICD-10-CM | POA: Diagnosis not present

## 2021-03-29 MED ORDER — METOPROLOL SUCCINATE ER 50 MG PO TB24: ORAL_TABLET | ORAL | 3 refills | Status: AC

## 2021-03-29 MED ORDER — MEGESTROL ACETATE 40 MG PO TABS: 40.0000 mg | ORAL_TABLET | Freq: Every day | ORAL | 0 refills | Status: DC

## 2021-03-29 MED ORDER — LEVOFLOXACIN 500 MG PO TABS: 500.0000 mg | ORAL_TABLET | Freq: Every day | ORAL | 0 refills | Status: AC

## 2021-03-29 MED ORDER — CIPROFLOXACIN HCL 500 MG PO TABS: 500.0000 mg | ORAL_TABLET | Freq: Two times a day (BID) | ORAL | 0 refills | Status: DC

## 2021-03-29 MED ORDER — METRONIDAZOLE 250 MG PO TABS: 250.0000 mg | ORAL_TABLET | Freq: Three times a day (TID) | ORAL | 0 refills | Status: AC

## 2021-03-29 NOTE — Progress Notes (Signed)
Patient ID: [name], female   DOB: 1951/04/20, 70 y.o.   MRN: [id number]        Chief Complaint: follow up HTN having stopped clonidine, and recent acute diverticulitis       HPI:  [name] is a 70 y.o. female here with c/o having been seen in ED 6.15 with CT proven mild sigmoid acute diverticulitis, tx with augmentin but not "bouncing back" as she has in the past, husband very concerned, pt has some mild persistent LLQ pain though some improved, and denies worsening fever, chills, confusion, further bowel habit change or blood.  Denies worsening reflux, other abd pain, dysphagia, n/v.  Has successfully weaned off the clonidine.  Pt denies chest pain, increased sob or doe, wheezing, orthopnea, PND, increased LE swelling, palpitations, dizziness or syncope.   Pt denies polydipsia, polyuria, or new focal neuro s/s.  O/w conts to have low appetite.        Wt Readings from Last 3 Encounters:  03/21/21 260 lb (117.9 kg)  03/12/21 260 lb (117.9 kg)  03/02/21 285 lb 0.9 oz (129.3 kg)   BP Readings from Last 3 Encounters:  03/29/21 140/82  03/21/21 118/64  03/12/21 108/60         Past Medical History:  Diagnosis Date   Anxiety    Arthritis    Blood dyscrasia    "FREE BLEEDER"   CERVICAL RADICULOPATHY, LEFT    Chronic back pain    CKD (chronic kidney disease)    DR. SANFORD  [facility]   COMMON MIGRAINE    CORONARY ARTERY DISEASE    Cough    CURRENT COLD   Cystitis    DEPRESSION    Diabetes mellitus, type II (HCC)    DIVERTICULOSIS-COLON    Dysrhythmia    palpitations   Gastric ulcer 04/2008   Gastroparesis    GERD (gastroesophageal reflux disease)    Hiatal hernia    Hyperlipidemia    Hypertension    Hypothyroidism    INSOMNIA-SLEEP DISORDER-UNSPEC    Iron deficiency anemia    Wears glasses    Past Surgical History:  Procedure Laterality Date   ABDOMINAL HYSTERECTOMY     ANTERIOR LAT LUMBAR FUSION Left 08/13/2017   Procedure: LEFT SIDED LUMBAR 3-4 LATERAL  INTERBODY FUSION WITH INSTRUMENTATION AND ALLOGRAFT;  Surgeon: Phylliss Bob, MD;  Location: [place];  Service: Orthopedics;  Laterality: Left;  LEFT SIDED LUMBAR 3-4 LATERAL INTERBODY FUSION WITH INSTRUMENTATION AND ALLOGRAFT; REQUEST 3 HOURS   Back Stimulator  07/2018   BACK SURGERY  03/2016   BREAST EXCISIONAL BIOPSY Right    40 years ago   CHOLECYSTECTOMY  06/2009   COLONOSCOPY     ESOPHAGOGASTRODUODENOSCOPY     EYE SURGERY Bilateral    lasik   Gastric Wedge resection lipoma  11/2007   x2 with laparotomy and gastrostomy   JOINT REPLACEMENT     Left knee replacement     Rigth knee replacement with revision  04/2008   Dr. Berenice Primas   ROTATOR CUFF REPAIR Left 01/2009   s/p bladder surgury  09/2009   Dr. Terance Hart   SPINAL CORD STIMULATOR INSERTION N/A 09/11/2018   Procedure: LUMBAR SPINAL CORD STIMULATOR INSERTION;  Surgeon: Clydell Hakim, MD;  Location: [facility];  Service: Neurosurgery;  Laterality: N/A;  LUMBAR SPINAL CORD STIMULATOR INSERTION    reports that she has quit smoking. She has never used smokeless tobacco. She reports that she does not drink alcohol and does not use drugs.  family history includes Breast cancer (age of onset: 7) in an other family member; Breast cancer (age of onset: 17) in her sister; Breast cancer (age of onset: 16) in her sister; Coronary artery disease in an other family member; Diabetes in her brother, brother, and sister; Heart disease in her brother and sister; Hypertension in an other family member. Allergies  Allergen Reactions   Ciprofloxacin Shortness Of Breath and Other (See Comments)    Dizziness    Hydrocodone Shortness Of Breath and Other (See Comments)    Tachycardia   Hydrocortisone    Penicillins Hives    Pt just remembers hives, SHE HAS HAD AUGEMTIN WITHOUT ISSUES PER PATIENT no SOB or lightheadedness with hypotension: No Has patient had a PCN reaction causing severe rash involving mucus membranes or skin necrosis: No Has patient had a PCN  reaction that required hospitalization No Has patient had a PCN reaction occurring within the last 10 years: No If all of the above answers are "NO", then may proceed with Cephalosporin use.    Current Outpatient Medications on File Prior to Visit  Medication Sig Dispense Refill   amLODipine (NORVASC) 5 MG tablet Take 1 tablet by mouth daily.     aspirin 81 MG EC tablet Take 81 mg by mouth daily.     atorvastatin (LIPITOR) 40 MG tablet TAKE ONE TABLET BY MOUTH EVERY NIGHT AT BEDTIME (Patient taking differently: Take 40 mg by mouth at bedtime.) 90 tablet 3   cloNIDine (CATAPRES) 0.1 MG tablet TAKE ONE TABLET BY MOUTH TWICE A DAY 180 tablet 0   Continuous Blood Gluc Receiver (FREESTYLE LIBRE 2 READER) DEVI Use as directed twice daily E11.9 1 each 0   Continuous Blood Gluc Sensor (FREESTYLE LIBRE 2 SENSOR) MISC Use as directed once bi-weekly E11.9 6 each 3   Dulaglutide (TRULICITY) 1.5 0000000 SOPN Inject into the skin.     ELMIRON 100 MG capsule Take 200 mg by mouth 2 (two) times daily.      FLUoxetine (PROZAC) 20 MG capsule TAKE ONE CAPSULE BY MOUTH DAILY (Patient taking differently: Take 20 mg by mouth daily. TAKE ONE CAPSULE BY MOUTH DAILY) 90 capsule 3   gabapentin (NEURONTIN) 600 MG tablet Take 600 mg by mouth See admin instructions. 1 tab in the am and at noon. 2 hs     glucose blood (FREESTYLE TEST STRIPS) test strip Use as instructed to check blood sugar 4 times daily. 100 each 3   glucose blood test strip Use as instructed 100 each 12   Insulin Glargine (BASAGLAR KWIKPEN) 100 UNIT/ML Inject 30 Units into the skin daily.     insulin lispro (HUMALOG) 100 UNIT/ML injection Inject 3-20 Units into the skin See admin instructions. Per sliding scale 3 times daily     isosorbide mononitrate (IMDUR) 60 MG 24 hr tablet Take 1 tablet (60 mg total) by mouth daily. 90 tablet 3   levothyroxine (SYNTHROID) 112 MCG tablet Take 112 mcg by mouth daily.     linaclotide (LINZESS) 72 MCG capsule Take 1  capsule (72 mcg total) by mouth daily before breakfast. 30 capsule 11   Loteprednol Etabonate (LOTEMAX SM) 0.38 % GEL INSTILL 1 DROP IN AFFECTED EYE(S) THREE TIMES DAILY     nitroGLYCERIN (NITROSTAT) 0.4 MG SL tablet Place 1 tablet (0.4 mg total) under the tongue every 5 (five) minutes as needed for chest pain. 20 tablet 0   ondansetron (ZOFRAN ODT) 4 MG disintegrating tablet Take 1 tablet (4 mg total) by mouth  every 8 (eight) hours as needed for up to 15 doses for nausea or vomiting. 15 tablet 0   ondansetron (ZOFRAN) 4 MG tablet Take 1 tablet (4 mg total) by mouth every 6 (six) hours. 12 tablet 0   pantoprazole (PROTONIX) 40 MG tablet TAKE 1 TABLET(40 MG) BY MOUTH TWICE DAILY 180 tablet 3   polyethylene glycol (MIRALAX / GLYCOLAX) 17 g packet Take 17 g by mouth daily as needed. 14 each 0   traMADol (ULTRAM) 50 MG tablet Take 1 tablet by mouth 2 (two) times daily as needed.     levothyroxine (SYNTHROID) 75 MCG tablet Take 3 tablets (225 mcg total) by mouth daily at 6 (six) AM. 90 tablet 0   No current facility-administered medications on file prior to visit.        ROS:  All others reviewed and negative.  Objective        PE:  BP 140/82 (BP Location: Right Arm, Patient Position: Sitting, Cuff Size: Large)   Pulse 98   Temp 97.9 F (36.6 C) (Oral)   SpO2 97%                 Constitutional: Pt appears in NAD               HENT: Head: NCAT.                Right Ear: External ear normal.                 Left Ear: External ear normal.                Eyes: . Pupils are equal, round, and reactive to light. Conjunctivae and EOM are normal               Nose: without d/c or deformity               Neck: Neck supple. Gross normal ROM               Cardiovascular: Normal rate and regular rhythm.                 Pulmonary/Chest: Effort normal and breath sounds without rales or wheezing.                Abd:  Soft, ND, + BS, no organomegaly, mild LLQ tender               Neurological: Pt is alert.  At baseline orientation, motor grossly intact               Skin: Skin is warm. No rashes, no other new lesions, LE edema - none               Psychiatric: Pt behavior is normal without agitation   Micro: none  Cardiac tracings I have personally interpreted today:  none  Pertinent Radiological findings (summarize): none   Lab Results  Component Value Date   WBC 8.6 03/21/2021   HGB 12.2 03/21/2021   HCT 38.3 03/21/2021   PLT 266 03/21/2021   GLUCOSE 264 (H) 03/21/2021   CHOL 175 01/26/2020   TRIG 71 01/26/2020   HDL 56 01/26/2020   LDLDIRECT 87.0 01/25/2020   LDLCALC 105 (H) 01/26/2020   ALT 19 03/02/2021   AST 20 03/02/2021   NA 137 03/21/2021   K 3.8 03/21/2021   CL 100 03/21/2021   CREATININE 1.16 (H) 03/21/2021   BUN 14 03/21/2021   CO2 25  03/21/2021   TSH 39.80 (H) 12/22/2020   INR 1.3 (H) 11/09/2020   HGBA1C 7.8 (A) 12/22/2020   MICROALBUR 1.5 07/06/2020   Assessment/Plan:  [name] is a 70 y.o. Black or African American [2] female with  has a past medical history of Anxiety, Arthritis, Blood dyscrasia, CERVICAL RADICULOPATHY, LEFT, Chronic back pain, CKD (chronic kidney disease), COMMON MIGRAINE, CORONARY ARTERY DISEASE, Cough, Cystitis, DEPRESSION, Diabetes mellitus, type II ([place]), DIVERTICULOSIS-COLON, Dysrhythmia, Gastric ulcer (04/2008), Gastroparesis, GERD (gastroesophageal reflux disease), Hiatal hernia, Hyperlipidemia, Hypertension, Hypothyroidism, INSOMNIA-SLEEP DISORDER-UNSPEC, Iron deficiency anemia, and Wears glasses.  Acute diverticulitis With some improvement it seems, but ok for change to levaquin/flagyl course, megace for limited course for appetite,  to f/u any worsening symptoms or concerns  CKD (chronic kidney disease) stage 4, GFR 15-29 ml/min (HCC) Lab Results  Component Value Date   CREATININE 1.16 (H) 03/21/2021   Stable overall, cont to avoid nephrotoxins  Diabetes ([place]) Lab Results  Component Value Date   HGBA1C 7.8 (A)  12/22/2020   Stable, pt to continue current medical treatment basaglar, trulicity   Hypertension associated with diabetes ([place]) Borderline elevated, likley situational and improved for overcontrolled last visit, to continue off clonidine for now  Followup: Return in about 3 months (around 06/29/2021).  Cathlean Cower, MD 03/31/2021 7:43 PM [facility]

## 2021-03-29 NOTE — Patient Instructions (Addendum)
Please take all new medication as prescribed - the levaquin and flagyl, and megace for appetite  Please continue all other medications as before, and refills have been done if requested.  Please have the pharmacy call with any other refills you may need.  Please keep your appointments with your specialists as you may have planned  Please make an Appointment to return in 3 months

## 2021-03-30 ENCOUNTER — Ambulatory Visit: Payer: Medicare Other | Admitting: Gastroenterology

## 2021-03-31 ENCOUNTER — Encounter: Payer: Self-pay | Admitting: Internal Medicine

## 2021-03-31 NOTE — Assessment & Plan Note (Signed)
Lab Results  Component Value Date   CREATININE 1.16 (H) 03/21/2021   Stable overall, cont to avoid nephrotoxins

## 2021-03-31 NOTE — Assessment & Plan Note (Signed)
With some improvement it seems, but ok for change to levaquin/flagyl course, megace for limited course for appetite,  to f/u any worsening symptoms or concerns

## 2021-03-31 NOTE — Assessment & Plan Note (Signed)
Lab Results  Component Value Date   HGBA1C 7.8 (A) 12/22/2020   Stable, pt to continue current medical treatment basaglar, trulicity

## 2021-03-31 NOTE — Assessment & Plan Note (Signed)
Borderline elevated, likley situational and improved for overcontrolled last visit, to continue off clonidine for now

## 2021-04-02 NOTE — Telephone Encounter (Signed)
Called to check on the status of FMLA paperwork. Provided a different fax # [phone]. Requested it to resent to that fax number instead as a high priority.   Please advise.

## 2021-04-03 ENCOUNTER — Ambulatory Visit: Payer: Medicare Other | Admitting: Internal Medicine

## 2021-04-03 NOTE — Telephone Encounter (Signed)
Form done 

## 2021-04-03 NOTE — Telephone Encounter (Signed)
Forms faxed copy kept and another sent to scan.

## 2021-04-04 DIAGNOSIS — E11649 Type 2 diabetes mellitus with hypoglycemia without coma: Secondary | ICD-10-CM | POA: Diagnosis not present

## 2021-04-04 NOTE — Telephone Encounter (Signed)
Forms have been refaxed to:    [phone]

## 2021-04-10 ENCOUNTER — Telehealth (INDEPENDENT_AMBULATORY_CARE_PROVIDER_SITE_OTHER): Payer: Medicare Other | Admitting: Family Medicine

## 2021-04-10 ENCOUNTER — Telehealth: Payer: Self-pay | Admitting: Internal Medicine

## 2021-04-10 DIAGNOSIS — M79641 Pain in right hand: Secondary | ICD-10-CM

## 2021-04-10 DIAGNOSIS — M79642 Pain in left hand: Secondary | ICD-10-CM

## 2021-04-10 NOTE — Telephone Encounter (Signed)
   Seeking advice  Spouse calling, seeking advice and/or medication for hand cramps  [place], [address] AT [place] appointment at this time due to inability to walk

## 2021-04-10 NOTE — Progress Notes (Signed)
Virtual Visit via Video Note  I connected with [name]  on 04/10/21 at  5:20 PM EDT by a video enabled telemedicine application and verified that I am speaking with the correct person using two identifiers.  Location patient: home,  Location provider:work or home office Persons participating in the virtual visit: patient, provider, grandson  I discussed the limitations of evaluation and management by telemedicine and the availability of in person appointments. The patient expressed understanding and agreed to proceed.   HPI:  Acute telemedicine visit for hand pain -started yesterday -Symptoms include: aches in the hands - R thumb and L 5th MCP -Denies: did fall last week, but was ok after that - o/w no falls or injury, contractures, twitching, swelling, redness, weakness, numbness, malaise, fevers or feeling unwell otherwise, no other aches or pains -can't think of new activities   ROS: See pertinent positives and negatives per HPI.  Past Medical History:  Diagnosis Date   Anxiety    Arthritis    Blood dyscrasia    "FREE BLEEDER"   CERVICAL RADICULOPATHY, LEFT    Chronic back pain    CKD (chronic kidney disease)    DR. SANFORD  [facility]   COMMON MIGRAINE    CORONARY ARTERY DISEASE    Cough    CURRENT COLD   Cystitis    DEPRESSION    Diabetes mellitus, type II (HCC)    DIVERTICULOSIS-COLON    Dysrhythmia    palpitations   Gastric ulcer 04/2008   Gastroparesis    GERD (gastroesophageal reflux disease)    Hiatal hernia    Hyperlipidemia    Hypertension    Hypothyroidism    INSOMNIA-SLEEP DISORDER-UNSPEC    Iron deficiency anemia    Wears glasses     Past Surgical History:  Procedure Laterality Date   ABDOMINAL HYSTERECTOMY     ANTERIOR LAT LUMBAR FUSION Left 08/13/2017   Procedure: LEFT SIDED LUMBAR 3-4 LATERAL INTERBODY FUSION WITH INSTRUMENTATION AND ALLOGRAFT;  Surgeon: Phylliss Bob, MD;  Location: [facility];  Service: Orthopedics;  Laterality: Left;   LEFT SIDED LUMBAR 3-4 LATERAL INTERBODY FUSION WITH INSTRUMENTATION AND ALLOGRAFT; REQUEST 3 HOURS   Back Stimulator  07/2018   BACK SURGERY  03/2016   BREAST EXCISIONAL BIOPSY Right    40 years ago   CHOLECYSTECTOMY  06/2009   COLONOSCOPY     ESOPHAGOGASTRODUODENOSCOPY     EYE SURGERY Bilateral    lasik   Gastric Wedge resection lipoma  11/2007   x2 with laparotomy and gastrostomy   JOINT REPLACEMENT     Left knee replacement     Rigth knee replacement with revision  04/2008   Dr. Berenice Primas   ROTATOR CUFF REPAIR Left 01/2009   s/p bladder surgury  09/2009   Dr. Terance Hart   SPINAL CORD STIMULATOR INSERTION N/A 09/11/2018   Procedure: LUMBAR SPINAL CORD STIMULATOR INSERTION;  Surgeon: Clydell Hakim, MD;  Location: [facility];  Service: Neurosurgery;  Laterality: N/A;  LUMBAR SPINAL CORD STIMULATOR INSERTION     Current Outpatient Medications:    amLODipine (NORVASC) 5 MG tablet, Take 1 tablet by mouth daily., Disp: , Rfl:    aspirin 81 MG EC tablet, Take 81 mg by mouth daily., Disp: , Rfl:    atorvastatin (LIPITOR) 40 MG tablet, TAKE ONE TABLET BY MOUTH EVERY NIGHT AT BEDTIME (Patient taking differently: Take 40 mg by mouth at bedtime.), Disp: 90 tablet, Rfl: 3   cloNIDine (CATAPRES) 0.1 MG tablet, TAKE ONE TABLET BY MOUTH TWICE A  DAY, Disp: 180 tablet, Rfl: 0   Continuous Blood Gluc Receiver (FREESTYLE LIBRE 2 READER) DEVI, Use as directed twice daily E11.9, Disp: 1 each, Rfl: 0   Continuous Blood Gluc Sensor (FREESTYLE LIBRE 2 SENSOR) MISC, Use as directed once bi-weekly E11.9, Disp: 6 each, Rfl: 3   Dulaglutide (TRULICITY) 1.5 0000000 SOPN, Inject into the skin., Disp: , Rfl:    ELMIRON 100 MG capsule, Take 200 mg by mouth 2 (two) times daily. , Disp: , Rfl:    FLUoxetine (PROZAC) 20 MG capsule, TAKE ONE CAPSULE BY MOUTH DAILY (Patient taking differently: Take 20 mg by mouth daily. TAKE ONE CAPSULE BY MOUTH DAILY), Disp: 90 capsule, Rfl: 3   gabapentin (NEURONTIN) 600 MG tablet, Take 600 mg by  mouth See admin instructions. 1 tab in the am and at noon. 2 hs, Disp: , Rfl:    glucose blood (FREESTYLE TEST STRIPS) test strip, Use as instructed to check blood sugar 4 times daily., Disp: 100 each, Rfl: 3   glucose blood test strip, Use as instructed, Disp: 100 each, Rfl: 12   Insulin Glargine (BASAGLAR KWIKPEN) 100 UNIT/ML, Inject 30 Units into the skin daily., Disp: , Rfl:    insulin lispro (HUMALOG) 100 UNIT/ML injection, Inject 3-20 Units into the skin See admin instructions. Per sliding scale 3 times daily, Disp: , Rfl:    isosorbide mononitrate (IMDUR) 60 MG 24 hr tablet, Take 1 tablet (60 mg total) by mouth daily., Disp: 90 tablet, Rfl: 3   levothyroxine (SYNTHROID) 112 MCG tablet, Take 112 mcg by mouth daily., Disp: , Rfl:    levothyroxine (SYNTHROID) 75 MCG tablet, Take 3 tablets (225 mcg total) by mouth daily at 6 (six) AM., Disp: 90 tablet, Rfl: 0   linaclotide (LINZESS) 72 MCG capsule, Take 1 capsule (72 mcg total) by mouth daily before breakfast., Disp: 30 capsule, Rfl: 11   Loteprednol Etabonate (LOTEMAX SM) 0.38 % GEL, INSTILL 1 DROP IN AFFECTED EYE(S) THREE TIMES DAILY, Disp: , Rfl:    megestrol (MEGACE) 40 MG tablet, Take 1 tablet (40 mg total) by mouth daily., Disp: 30 tablet, Rfl: 0   metoprolol succinate (TOPROL-XL) 50 MG 24 hr tablet, TAKE 1 TABLET BY MOUTH DAILY WITH OR IMMEDIATELY FOLLOWING A MEAL, Disp: 90 tablet, Rfl: 3   nitroGLYCERIN (NITROSTAT) 0.4 MG SL tablet, Place 1 tablet (0.4 mg total) under the tongue every 5 (five) minutes as needed for chest pain., Disp: 20 tablet, Rfl: 0   ondansetron (ZOFRAN ODT) 4 MG disintegrating tablet, Take 1 tablet (4 mg total) by mouth every 8 (eight) hours as needed for up to 15 doses for nausea or vomiting., Disp: 15 tablet, Rfl: 0   ondansetron (ZOFRAN) 4 MG tablet, Take 1 tablet (4 mg total) by mouth every 6 (six) hours., Disp: 12 tablet, Rfl: 0   pantoprazole (PROTONIX) 40 MG tablet, TAKE 1 TABLET(40 MG) BY MOUTH TWICE DAILY,  Disp: 180 tablet, Rfl: 3   polyethylene glycol (MIRALAX / GLYCOLAX) 17 g packet, Take 17 g by mouth daily as needed., Disp: 14 each, Rfl: 0   traMADol (ULTRAM) 50 MG tablet, Take 1 tablet by mouth 2 (two) times daily as needed., Disp: , Rfl:   EXAM:  VITALS per patient if applicable:  GENERAL: alert, oriented, appears well and in no acute distress  HEENT: atraumatic, conjunttiva clear, no obvious abnormalities on inspection of external nose and ears  NECK: normal movements of the head and neck  LUNGS: on inspection no signs of respiratory  distress, breathing rate appears normal, no obvious gross SOB, gasping or wheezing  CV: no obvious cyanosis  MS: moves all visible extremities without noticeable abnormality, able to flex and extend figers/wrist normally in video visit, no appreciable visible contractures or fasciculations  PSYCH/NEURO: pleasant and cooperative, no obvious depression or anxiety, speech and thought processing grossly intact  ASSESSMENT AND PLAN:  Discussed the following assessment and plan:  Pain in both hands  -we discussed possible serious and likely etiologies, options for evaluation and workup, limitations of telemedicine visit vs in person visit, treatment, treatment risks and precautions. Pt prefers to treat via telemedicine empirically rather than in person at this moment. Query arthritis, strain from recent fall vs other. Son reports does not drink much fluids. Opted to try tylenol, topical sports creams and adequate hydration. Advised inperson evaluation would be best and they agree to seek inperson care if worsening, new symptoms arise, or if is not improving with treatment. Discussed options for inperson care if PCP office not available.   I discussed the assessment and treatment plan with the patient. The patient was provided an opportunity to ask questions and all were answered. The patient agreed with the plan and demonstrated an understanding of the  instructions.     Lucretia Kern, DO

## 2021-04-10 NOTE — Patient Instructions (Signed)
-  can try tylenol [phone] mg up to 3 times daily  -topical menthol or capsaicin sports creams may help  -stay hydrated  I hope you are feeling better soon!  Seek in person care promptly if your symptoms worsen, new concerns arise or you are not improving with treatment over the next few days.  It was nice to meet you today. I help [facility] out with telemedicine visits on Tuesdays and Thursdays and am available for visits on those days. If you have any concerns or questions following this visit please schedule a follow up visit with your Primary Care doctor or seek care at a local urgent care clinic to avoid delays in care.

## 2021-04-11 ENCOUNTER — Other Ambulatory Visit: Payer: Medicare Other

## 2021-04-11 MED ORDER — TIZANIDINE HCL 2 MG PO TABS: 2.0000 mg | ORAL_TABLET | Freq: Four times a day (QID) | ORAL | 1 refills | Status: DC | PRN

## 2021-04-11 NOTE — Telephone Encounter (Signed)
Ok for tizanidine 2 mg  - done to [facility]

## 2021-04-12 NOTE — Telephone Encounter (Signed)
Notified patient's husband of prescription. Per patient's spouse, patient has had a fall since her last visit here 6/23 and has some leg pain. They are requesting HH/PT services and an appt to discuss everything. Patient scheduled for 04/17/21

## 2021-04-14 ENCOUNTER — Emergency Department (HOSPITAL_COMMUNITY): Payer: Medicare Other

## 2021-04-14 ENCOUNTER — Inpatient Hospital Stay (HOSPITAL_COMMUNITY)
Admission: EM | Admit: 2021-04-14 | Discharge: 2021-04-25 | DRG: 480 | Disposition: A | Discharge status: Home Health | Payer: Medicare Other | Attending: Internal Medicine | Admitting: Internal Medicine

## 2021-04-14 ENCOUNTER — Encounter (HOSPITAL_COMMUNITY): Payer: Self-pay

## 2021-04-14 DIAGNOSIS — M25551 Pain in right hip: Secondary | ICD-10-CM | POA: Diagnosis not present

## 2021-04-14 DIAGNOSIS — R4182 Altered mental status, unspecified: Secondary | ICD-10-CM | POA: Diagnosis not present

## 2021-04-14 DIAGNOSIS — Z981 Arthrodesis status: Secondary | ICD-10-CM | POA: Diagnosis not present

## 2021-04-14 DIAGNOSIS — W19XXXA Unspecified fall, initial encounter: Secondary | ICD-10-CM

## 2021-04-14 DIAGNOSIS — Z20822 Contact with and (suspected) exposure to covid-19: Secondary | ICD-10-CM | POA: Diagnosis not present

## 2021-04-14 DIAGNOSIS — E119 Type 2 diabetes mellitus without complications: Secondary | ICD-10-CM

## 2021-04-14 DIAGNOSIS — E1143 Type 2 diabetes mellitus with diabetic autonomic (poly)neuropathy: Secondary | ICD-10-CM | POA: Diagnosis present

## 2021-04-14 DIAGNOSIS — Z803 Family history of malignant neoplasm of breast: Secondary | ICD-10-CM

## 2021-04-14 DIAGNOSIS — M9711XA Periprosthetic fracture around internal prosthetic right knee joint, initial encounter: Secondary | ICD-10-CM | POA: Diagnosis present

## 2021-04-14 DIAGNOSIS — Z419 Encounter for procedure for purposes other than remedying health state, unspecified: Secondary | ICD-10-CM

## 2021-04-14 DIAGNOSIS — Z8249 Family history of ischemic heart disease and other diseases of the circulatory system: Secondary | ICD-10-CM

## 2021-04-14 DIAGNOSIS — Z90710 Acquired absence of both cervix and uterus: Secondary | ICD-10-CM

## 2021-04-14 DIAGNOSIS — M48061 Spinal stenosis, lumbar region without neurogenic claudication: Secondary | ICD-10-CM | POA: Diagnosis present

## 2021-04-14 DIAGNOSIS — E1122 Type 2 diabetes mellitus with diabetic chronic kidney disease: Secondary | ICD-10-CM | POA: Diagnosis present

## 2021-04-14 DIAGNOSIS — M25519 Pain in unspecified shoulder: Secondary | ICD-10-CM

## 2021-04-14 DIAGNOSIS — M79604 Pain in right leg: Secondary | ICD-10-CM | POA: Diagnosis present

## 2021-04-14 DIAGNOSIS — G319 Degenerative disease of nervous system, unspecified: Secondary | ICD-10-CM | POA: Diagnosis not present

## 2021-04-14 DIAGNOSIS — D62 Acute posthemorrhagic anemia: Secondary | ICD-10-CM | POA: Diagnosis not present

## 2021-04-14 DIAGNOSIS — S32010A Wedge compression fracture of first lumbar vertebra, initial encounter for closed fracture: Secondary | ICD-10-CM | POA: Diagnosis not present

## 2021-04-14 DIAGNOSIS — E0580 Other thyrotoxicosis without thyrotoxic crisis or storm: Secondary | ICD-10-CM | POA: Diagnosis present

## 2021-04-14 DIAGNOSIS — I10 Essential (primary) hypertension: Secondary | ICD-10-CM | POA: Diagnosis not present

## 2021-04-14 DIAGNOSIS — I129 Hypertensive chronic kidney disease with stage 1 through stage 4 chronic kidney disease, or unspecified chronic kidney disease: Secondary | ICD-10-CM | POA: Diagnosis present

## 2021-04-14 DIAGNOSIS — K219 Gastro-esophageal reflux disease without esophagitis: Secondary | ICD-10-CM | POA: Diagnosis present

## 2021-04-14 DIAGNOSIS — D649 Anemia, unspecified: Secondary | ICD-10-CM | POA: Diagnosis present

## 2021-04-14 DIAGNOSIS — K3184 Gastroparesis: Secondary | ICD-10-CM | POA: Diagnosis present

## 2021-04-14 DIAGNOSIS — G9341 Metabolic encephalopathy: Secondary | ICD-10-CM | POA: Diagnosis not present

## 2021-04-14 DIAGNOSIS — Z87891 Personal history of nicotine dependence: Secondary | ICD-10-CM

## 2021-04-14 DIAGNOSIS — Z6841 Body Mass Index (BMI) 40.0 and over, adult: Secondary | ICD-10-CM | POA: Diagnosis not present

## 2021-04-14 DIAGNOSIS — Z7982 Long term (current) use of aspirin: Secondary | ICD-10-CM

## 2021-04-14 DIAGNOSIS — R0789 Other chest pain: Secondary | ICD-10-CM | POA: Diagnosis not present

## 2021-04-14 DIAGNOSIS — D72828 Other elevated white blood cell count: Secondary | ICD-10-CM | POA: Diagnosis present

## 2021-04-14 DIAGNOSIS — G894 Chronic pain syndrome: Secondary | ICD-10-CM | POA: Diagnosis present

## 2021-04-14 DIAGNOSIS — R079 Chest pain, unspecified: Secondary | ICD-10-CM | POA: Diagnosis not present

## 2021-04-14 DIAGNOSIS — N183 Chronic kidney disease, stage 3 unspecified: Secondary | ICD-10-CM | POA: Diagnosis present

## 2021-04-14 DIAGNOSIS — M5127 Other intervertebral disc displacement, lumbosacral region: Secondary | ICD-10-CM | POA: Diagnosis not present

## 2021-04-14 DIAGNOSIS — M25571 Pain in right ankle and joints of right foot: Secondary | ICD-10-CM | POA: Diagnosis not present

## 2021-04-14 DIAGNOSIS — E039 Hypothyroidism, unspecified: Secondary | ICD-10-CM | POA: Diagnosis present

## 2021-04-14 DIAGNOSIS — M545 Low back pain, unspecified: Secondary | ICD-10-CM | POA: Diagnosis not present

## 2021-04-14 DIAGNOSIS — R627 Adult failure to thrive: Secondary | ICD-10-CM | POA: Diagnosis present

## 2021-04-14 DIAGNOSIS — W1839XA Other fall on same level, initial encounter: Secondary | ICD-10-CM | POA: Diagnosis present

## 2021-04-14 DIAGNOSIS — R634 Abnormal weight loss: Secondary | ICD-10-CM | POA: Diagnosis present

## 2021-04-14 DIAGNOSIS — M546 Pain in thoracic spine: Secondary | ICD-10-CM | POA: Diagnosis not present

## 2021-04-14 DIAGNOSIS — R1032 Left lower quadrant pain: Secondary | ICD-10-CM | POA: Diagnosis present

## 2021-04-14 DIAGNOSIS — Z833 Family history of diabetes mellitus: Secondary | ICD-10-CM

## 2021-04-14 DIAGNOSIS — N1832 Chronic kidney disease, stage 3b: Secondary | ICD-10-CM | POA: Diagnosis present

## 2021-04-14 DIAGNOSIS — M542 Cervicalgia: Secondary | ICD-10-CM | POA: Diagnosis not present

## 2021-04-14 DIAGNOSIS — E559 Vitamin D deficiency, unspecified: Secondary | ICD-10-CM | POA: Diagnosis present

## 2021-04-14 DIAGNOSIS — S72451A Displaced supracondylar fracture without intracondylar extension of lower end of right femur, initial encounter for closed fracture: Secondary | ICD-10-CM | POA: Diagnosis not present

## 2021-04-14 DIAGNOSIS — Z79899 Other long term (current) drug therapy: Secondary | ICD-10-CM

## 2021-04-14 DIAGNOSIS — R531 Weakness: Secondary | ICD-10-CM | POA: Diagnosis not present

## 2021-04-14 DIAGNOSIS — Z9181 History of falling: Secondary | ICD-10-CM

## 2021-04-14 DIAGNOSIS — D72829 Elevated white blood cell count, unspecified: Secondary | ICD-10-CM | POA: Diagnosis present

## 2021-04-14 DIAGNOSIS — Z79890 Hormone replacement therapy (postmenopausal): Secondary | ICD-10-CM

## 2021-04-14 DIAGNOSIS — R296 Repeated falls: Secondary | ICD-10-CM | POA: Diagnosis not present

## 2021-04-14 DIAGNOSIS — I2510 Atherosclerotic heart disease of native coronary artery without angina pectoris: Secondary | ICD-10-CM | POA: Diagnosis present

## 2021-04-14 DIAGNOSIS — D539 Nutritional anemia, unspecified: Secondary | ICD-10-CM | POA: Diagnosis present

## 2021-04-14 DIAGNOSIS — E1142 Type 2 diabetes mellitus with diabetic polyneuropathy: Secondary | ICD-10-CM | POA: Diagnosis present

## 2021-04-14 DIAGNOSIS — E785 Hyperlipidemia, unspecified: Secondary | ICD-10-CM | POA: Diagnosis present

## 2021-04-14 DIAGNOSIS — Z9049 Acquired absence of other specified parts of digestive tract: Secondary | ICD-10-CM

## 2021-04-14 DIAGNOSIS — Z794 Long term (current) use of insulin: Secondary | ICD-10-CM

## 2021-04-14 DIAGNOSIS — Y92009 Unspecified place in unspecified non-institutional (private) residence as the place of occurrence of the external cause: Secondary | ICD-10-CM

## 2021-04-14 DIAGNOSIS — T148XXA Other injury of unspecified body region, initial encounter: Secondary | ICD-10-CM

## 2021-04-14 NOTE — ED Notes (Signed)
Pt transported to xray 

## 2021-04-14 NOTE — ED Provider Notes (Signed)
[facility] EMERGENCY DEPARTMENT Provider Note   CSN: [id number] Arrival date & time: 04/14/21  2151     History Chief Complaint  Patient presents with   Chest Pain    [name] is a 70 y.o. female with a history of HTN, HLD, CAD, hypothyroidism, diabetes mellitus type 2, CKD, left-sided cervical radiculopathy who presents the emergency department by EMS from home with a chief complaint of chest pain.  Most of his the history is provided by the patient's husband, [name].  He reports that he called EMS after the patient developed chest pain, characterized as tightness, that began today.  She is followed by Dr. Terrence Dupont with cardiology who provide her nitroglycerin patches.  She used 2 of them, but only reported to her husband a minimal amount of improvement with NTG.  He reports that he checked her blood pressure, blood sugar, temperature at home and all of these vital signs seemed okay.  Her continued episodes of chest pain prompted her visit to the emergency department.  He reports that over the last few weeks that she has had multiple falls.  She was previously using a cane, but has now been having to use a walker.  Now, he reports that she has been laying in bed for the last few days.  He states that when she walks that her left leg seems to be giving way and she is complained about it being swollen and sore.   He reports that she is typically alert and oriented x3.  However, the last time she was in the ER she had an infection she was confused.  He did also report that she did vomit after breakfast yesterday, but vomiting has since resolved.  Per chart review, she had a stress test in April 2021 that was stratified as low risk.  Left ventricular ejection fraction of 66%.  Normal ventricular wall motion and there was no reversible ischemia or infarction.  In the ED, the patient reports that she has been having intermittent chest pain today.  She also endorses low back  pain.  She states that she is feeling weakness and pain in her right arm as well as weakness and pain in her left leg.  Patient believes that the year is 2003.  She believes the month to be May.  She does not know who the president is.  She knows that she lives in [place] and knows her first and last name.  Level V caveat secondary to altered mental status.   The history is provided by the patient and medical records. No language interpreter was used.      Past Medical History:  Diagnosis Date   Anxiety    Arthritis    Blood dyscrasia    "FREE BLEEDER"   CERVICAL RADICULOPATHY, LEFT    Chronic back pain    CKD (chronic kidney disease)    DR. SANF[facility]   COMMON MIGRAINE    CORONARY ARTERY DISEASE    Cough    CURRENT COLD   Cystitis    DEPRESSION    Diabetes mellitus, type II (HCC)    DIVERTICULOSIS-COLON    Dysrhythmia    palpitations   Gastric ulcer 04/2008   Gastroparesis    GERD (gastroesophageal reflux disease)    Hiatal hernia    Hyperlipidemia    Hypertension    Hypothyroidism    INSOMNIA-SLEEP DISORDER-UNSPEC    Iron deficiency anemia    Wears glasses  Patient Active Problem List   Diagnosis Date Noted   Acute diverticulitis 03/21/2021   Degenerative disc disease, cervical 02/08/2021   Type 2 diabetes mellitus ([place]) 02/02/2021   Acquired hypothyroidism 11/24/2020   Chronic pain 11/24/2020   Severe sepsis ([place]) 11/09/2020   Hyperlipidemia associated with type 2 diabetes mellitus ([place]) 99991111   Acute metabolic encephalopathy 99991111   Acute lower UTI 11/09/2020   Recurrent falls 10/19/2020   Gait disorder 10/11/2020   Fall 10/11/2020   RLQ abdominal pain 05/05/2020   Low grade fever 02/04/2020   Chest pain 01/26/2020   Lumbar post-laminectomy syndrome 10/05/2019   Spinal cord stimulator status 10/05/2019   Body mass index (BMI) 40.0-44.9, adult ([place]) 10/05/2019   Rectal bleeding 09/01/2019   History of peptic  ulcer disease 09/01/2019   Screening for viral disease 09/01/2019   Insulin dependent type 2 diabetes mellitus ([place]) 09/01/2019   Pyelonephritis 06/05/2019   Recurrent UTI 06/05/2019   Urinary incontinence 06/05/2019   Nausea & vomiting 02/08/2019   Abdominal pain 02/08/2019   Constipation 02/08/2019   UTI (urinary tract infection) 02/08/2019   LUQ pain 01/21/2019   Cervical radiculopathy at C6 01/28/2018   Left lateral epicondylitis 01/23/2018   Obesity 01/23/2018   Weakness 05/01/2017   Hyperkalemia 05/01/2017   Acute kidney injury superimposed on CKD ([place]) 05/01/2017   Essential (primary) hypertension 05/01/2017   Sepsis ([place]) 05/01/2017   Dysuria 04/23/2017   Right leg swelling 12/27/2016   Leg pain, lateral, right 11/05/2016   Radiculopathy 04/03/2016   Hypersomnolence 11/23/2015   Right sided abdominal pain 07/25/2015   Malaise and fatigue 11/05/2014   Rotator cuff syndrome 07/26/2014   Fracture of fifth metacarpal bone of right hand 07/08/2014   Right cervical radiculopathy 06/27/2014   Chronic interstitial cystitis 05/23/2014   Osteoarthritis of CMC joint of thumb 04/11/2014   De Quervain's tenosynovitis, left 03/14/2014   CKD (chronic kidney disease) stage 4, GFR 15-29 ml/min (HCC) 03/03/2014   Lower back pain 03/03/2014   Ingrown nail 03/03/2014   Peripheral edema 02/23/2014   Lateral epicondylitis of right elbow 01/31/2014   Neck pain on left side 11/19/2013   Abdominal discomfort 10/12/2012   Right lumbar radiculopathy 05/11/2012   Peripheral neuropathy 05/11/2012   Vertigo 03/29/2012   Headache(784.0) 03/10/2012   Lumbar radiculopathy 10/24/2011   Other postablative hypothyroidism 08/19/2011   Right leg weakness 04/04/2011   Hematochezia 01/07/2011   Preventative health care 01/07/2011   CHOLELITHIASIS 05/04/2009   GERD 04/26/2009   Gastroparesis 04/26/2009   ULCER-GASTRIC 03/15/2009   DIVERTICULOSIS-COLON 03/15/2009   Cervical radiculopathy  10/28/2008   MENOPAUSAL DISORDER 06/29/2008   Pain in joint, shoulder region 10/12/2007   INSOMNIA-SLEEP DISORDER-UNSPEC 09/01/2007   Depression 09/01/2007   ANEMIA-NOS 08/13/2007   COMMON MIGRAINE 08/13/2007   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   Diabetes ([place]) 05/06/2007   HLD (hyperlipidemia) 05/06/2007   ANXIETY 05/06/2007   Hypertension associated with diabetes ([place]) 05/06/2007   Coronary atherosclerosis 05/06/2007    Past Surgical History:  Procedure Laterality Date   ABDOMINAL HYSTERECTOMY     ANTERIOR LAT LUMBAR FUSION Left 08/13/2017   Procedure: LEFT SIDED LUMBAR 3-4 LATERAL INTERBODY FUSION WITH INSTRUMENTATION AND ALLOGRAFT;  Surgeon: Phylliss Bob, MD;  Location: [facility];  Service: Orthopedics;  Laterality: Left;  LEFT SIDED LUMBAR 3-4 LATERAL INTERBODY FUSION WITH INSTRUMENTATION AND ALLOGRAFT; REQUEST 3 HOURS   Back Stimulator  07/2018   BACK SURGERY  03/2016   BREAST EXCISIONAL BIOPSY Right    40 years  ago   CHOLECYSTECTOMY  06/2009   COLONOSCOPY     ESOPHAGOGASTRODUODENOSCOPY     EYE SURGERY Bilateral    lasik   Gastric Wedge resection lipoma  11/2007   x2 with laparotomy and gastrostomy   JOINT REPLACEMENT     Left knee replacement     Rigth knee replacement with revision  04/2008   Dr. Berenice Primas   ROTATOR CUFF REPAIR Left 01/2009   s/p bladder surgury  09/2009   Dr. Terance Hart   SPINAL CORD STIMULATOR INSERTION N/A 09/11/2018   Procedure: LUMBAR SPINAL CORD STIMULATOR INSERTION;  Surgeon: Clydell Hakim, MD;  Location: [facility];  Service: Neurosurgery;  Laterality: N/A;  LUMBAR SPINAL CORD STIMULATOR INSERTION     OB History     Gravida  3   Para  3   Term      Preterm      AB      Living  3      SAB      IAB      Ectopic      Multiple      Live Births              Family History  Problem Relation Age of Onset   Diabetes Sister        x 3   Heart disease Sister        x2   Breast cancer Sister 65   Diabetes Brother        x3    Heart disease Brother        x2   Coronary artery disease Other        female 1st degree   Hypertension Other    Breast cancer Sister 75   Breast cancer Other 25   Diabetes Brother    Colon cancer Neg Hx     Social History   Tobacco Use   Smoking status: Former    Pack years: 0.00   Smokeless tobacco: Never   Tobacco comments:    quit 30 years ago  Vaping Use   Vaping Use: Never used  Substance Use Topics   Alcohol use: No    Alcohol/week: 0.0 standard drinks   Drug use: No    Home Medications Prior to Admission medications   Medication Sig Start Date End Date Taking? Authorizing Provider  amLODipine (NORVASC) 5 MG tablet Take 5 mg by mouth daily. 12/12/20  Yes [provider]  aspirin 81 MG EC tablet Take 81 mg by mouth daily.   Yes [provider]  atorvastatin (LIPITOR) 40 MG tablet TAKE ONE TABLET BY MOUTH EVERY NIGHT AT BEDTIME Patient taking differently: Take 40 mg by mouth at bedtime. 06/13/20  Yes Biagio Borg, MD  cloNIDine (CATAPRES) 0.1 MG tablet TAKE ONE TABLET BY MOUTH TWICE A DAY Patient taking differently: Take 0.1 mg by mouth 2 (two) times daily. 03/01/21  Yes Biagio Borg, MD  Dulaglutide (TRULICITY) 1.5 0000000 SOPN Inject 1.5 mg into the skin every Monday. 02/02/21  Yes [provider]  ELMIRON 100 MG capsule Take 200 mg by mouth 2 (two) times daily.  07/20/13  Yes [provider]  FLUoxetine (PROZAC) 20 MG capsule TAKE ONE CAPSULE BY MOUTH DAILY Patient taking differently: Take 20 mg by mouth daily. TAKE ONE CAPSULE BY MOUTH DAILY 11/13/20  Yes Biagio Borg, MD  gabapentin (NEURONTIN) 600 MG tablet Take 600-1,200 mg by mouth See admin instructions. 600 mg in the morning and midday  1200 mg at bedtime. 01/24/20  Yes [provider]  glucose blood (FREESTYLE TEST STRIPS) test strip Use as instructed to check blood sugar 4 times daily. 04/20/18  Yes Elayne Snare, MD  glucose blood test strip Use as instructed 12/21/20  Yes  Elayne Snare, MD  Insulin Glargine [facility] KWIKPEN) 100 UNIT/ML Inject 30 Units into the skin daily. 02/02/21  Yes [provider]  insulin lispro (HUMALOG) 100 UNIT/ML injection Inject 3-20 Units into the skin See admin instructions. Per sliding scale 3 times daily   Yes [provider]  isosorbide mononitrate (IMDUR) 60 MG 24 hr tablet Take 1 tablet (60 mg total) by mouth daily. 02/21/20  Yes Biagio Borg, MD  levothyroxine (SYNTHROID) 112 MCG tablet Take 112 mcg by mouth daily. 02/19/21  Yes [provider]  linaclotide (LINZESS) 72 MCG capsule Take 1 capsule (72 mcg total) by mouth daily before breakfast. Patient taking differently: Take 72 mcg by mouth daily as needed (constipation). 11/13/20  Yes Biagio Borg, MD  Loteprednol Etabonate (LOTEMAX SM) 0.38 % GEL Place 1 drop into both eyes 3 (three) times daily. 11/29/20  Yes [provider]  megestrol (MEGACE) 40 MG tablet Take 1 tablet (40 mg total) by mouth daily. 03/29/21  Yes Biagio Borg, MD  metoprolol succinate (TOPROL-XL) 50 MG 24 hr tablet TAKE 1 TABLET BY MOUTH DAILY WITH OR IMMEDIATELY FOLLOWING A MEAL Patient taking differently: Take 50 mg by mouth daily. 03/29/21  Yes Biagio Borg, MD  nitroGLYCERIN (NITROSTAT) 0.4 MG SL tablet Place 1 tablet (0.4 mg total) under the tongue every 5 (five) minutes as needed for chest pain. 01/27/20  Yes Ghimire, Henreitta Leber, MD  ondansetron (ZOFRAN) 4 MG tablet Take 1 tablet (4 mg total) by mouth every 6 (six) hours. Patient taking differently: Take 4 mg by mouth every 8 (eight) hours as needed for vomiting or nausea. 03/02/21  Yes Curatolo, Adam, DO  pantoprazole (PROTONIX) 40 MG tablet TAKE 1 TABLET(40 MG) BY MOUTH TWICE DAILY Patient taking differently: Take 40 mg by mouth 2 (two) times daily. 11/13/20  Yes Biagio Borg, MD  polyethylene glycol (MIRALAX / GLYCOLAX) 17 g packet Take 17 g by mouth daily as needed. Patient taking differently: Take 17 g by mouth daily as  needed for mild constipation. 01/27/20  Yes Ghimire, Henreitta Leber, MD  tiZANidine (ZANAFLEX) 2 MG tablet Take 1 tablet (2 mg total) by mouth every 6 (six) hours as needed for muscle spasms. 04/11/21  Yes Biagio Borg, MD  traMADol (ULTRAM) 50 MG tablet Take 50 mg by mouth 2 (two) times daily as needed for moderate pain. 02/08/21  Yes [provider]  clindamycin (CLEOCIN) 150 MG capsule Take 150 mg by mouth See admin instructions. Qid x 6 days Patient not taking: Reported on 04/15/2021 04/03/21   [provider]  Continuous Blood Gluc Receiver (FREESTYLE LIBRE 2 READER) DEVI Use as directed twice daily E11.9 10/11/20   Biagio Borg, MD  Continuous Blood Gluc Sensor (FREESTYLE LIBRE 2 SENSOR) MISC Use as directed once bi-weekly E11.9 10/11/20   Biagio Borg, MD  ondansetron (ZOFRAN ODT) 4 MG disintegrating tablet Take 1 tablet (4 mg total) by mouth every 8 (eight) hours as needed for up to 15 doses for nausea or vomiting. Patient not taking: Reported on 04/15/2021 03/21/21   [name], MD    Allergies    Ciprofloxacin, Hydrocodone, Hydrocortisone, and Penicillins  Review of Systems   Review  of Systems  Unable to perform ROS: Mental status change   Physical Exam Updated Vital Signs BP (!) 160/90 (BP Location: Right Arm)   Pulse 94   Temp 98.6 F (37 C)   Resp 16   Ht '5\' 6"'$  (1.676 m)   Wt 117.9 kg   SpO2 100%   BMI 41.97 kg/m   Physical Exam Vitals and nursing note reviewed.  Constitutional:      General: She is not in acute distress. HENT:     Head: Normocephalic.     Mouth/Throat:     Mouth: Mucous membranes are dry.  Eyes:     Extraocular Movements: Extraocular movements intact.     Conjunctiva/sclera: Conjunctivae normal.     Pupils: Pupils are equal, round, and reactive to light.  Cardiovascular:     Rate and Rhythm: Normal rate and regular rhythm.     Heart sounds: No murmur heard.   No friction rub. No gallop.  Pulmonary:     Effort: Pulmonary effort  is normal. No respiratory distress.     Breath sounds: No stridor. No wheezing, rhonchi or rales.  Chest:     Chest wall: No tenderness.  Abdominal:     General: There is no distension.     Palpations: Abdomen is soft. There is no mass.     Tenderness: There is no abdominal tenderness. There is no right CVA tenderness, left CVA tenderness, guarding or rebound.     Hernia: No hernia is present.  Musculoskeletal:     Cervical back: Neck supple.     Right lower leg: No edema.     Left lower leg: No edema.     Comments: Tender to palpation to the lumbar spinous processes.  No crepitus or step-offs.  Cervical and thoracic spine are nontender to palpation.  Skin:    General: Skin is warm.     Findings: No rash.  Neurological:     Mental Status: She is alert.     Comments: Oriented to person.  Knows that she lives in [place].  Follows simple commands.  Sensation is decreased to light touch on the left leg as compared to the right.  Good strength against resistance.  Patient is able to lift the bilateral legs off the bed.  No pronator drift of the bilateral arms.  Sensations intact to the bilateral upper extremities.  Cranial nerves II through XII are grossly intact.  Follows simple commands.  Psychiatric:        Behavior: Behavior normal.    ED Results / Procedures / Treatments   Labs (all labs ordered are listed, but only abnormal results are displayed) Labs Reviewed  COMPREHENSIVE METABOLIC PANEL - Abnormal; Notable for the following components:      Result Value   Glucose, Bld 138 (*)    Creatinine, Ser 1.16 (*)    Albumin 3.1 (*)    AST 14 (*)    GFR, Estimated 50 (*)    All other components within normal limits  CBC WITH DIFFERENTIAL/PLATELET - Abnormal; Notable for the following components:   WBC 11.1 (*)    RBC 2.98 (*)    Hemoglobin 9.8 (*)    HCT 30.2 (*)    MCV 101.3 (*)    nRBC 0.3 (*)    Abs Immature Granulocytes 0.09 (*)    All other components  within normal limits  CBG MONITORING, ED - Abnormal; Notable for the following components:   Glucose-Capillary 139 (*)  All other components within normal limits  RESP PANEL BY RT-PCR (FLU A&B, COVID) ARPGX2  URINE CULTURE  URINALYSIS, ROUTINE W REFLEX MICROSCOPIC  T4, FREE  TSH  VITAMIN B12  HEMOGLOBIN AND HEMATOCRIT, BLOOD  HEMOGLOBIN A1C  SEDIMENTATION RATE  C-REACTIVE PROTEIN  FOLATE  TROPONIN I (HIGH SENSITIVITY)  TROPONIN I (HIGH SENSITIVITY)    EKG EKG Interpretation  Date/Time:  Saturday April 14 2021 22:28:50 EDT Ventricular Rate:  86 PR Interval:  160 QRS Duration: 82 QT Interval:  358 QTC Calculation: 428 R Axis:   17 Text Interpretation: Normal sinus rhythm Anterior infarct , age undetermined Abnormal ECG Confirmed by Thamas Jaegers ([id number]) on 04/15/2021 12:23:08 AM  Radiology DG Chest 2 View  Result Date: 04/14/2021 CLINICAL DATA:  Chest pain for 1 hour EXAM: CHEST - 2 VIEW COMPARISON:  11/09/2020 FINDINGS: The heart size and mediastinal contours are within normal limits. Both lungs are clear. The visualized skeletal structures are unremarkable. Spinal stimulator is noted in place. IMPRESSION: No active cardiopulmonary disease. Electronically Signed   By: Inez Catalina M.D.   On: 04/14/2021 22:23   DG Thoracic Spine 2 View  Result Date: 04/15/2021 CLINICAL DATA:  Recent falls with back pain, initial encounter EXAM: THORACIC SPINE 2 VIEWS COMPARISON:  None FINDINGS: Spinal stimulator is noted. Vertebral body height is well maintained. No pedicle abnormality or paraspinal mass is noted. No definitive rib abnormality is seen. IMPRESSION: No acute abnormality noted. Electronically Signed   By: Inez Catalina M.D.   On: 04/15/2021 00:01   DG Lumbar Spine Complete  Result Date: 04/14/2021 CLINICAL DATA:  Frequent falls with low back pain, initial encounter EXAM: LUMBAR SPINE - COMPLETE 4+ VIEW COMPARISON:  04/05/2018 FINDINGS: Five lumbar type vertebral bodies are well  visualized. Vertebral body height is well maintained. Changes of prior interbody fusion are noted at L3-4. L3-L5 fixation is noted pedicle screws and posterior fixation elements. Left lateral fixation of L3-4 is noted. Spinal stimulator is seen. No pars defects are noted. No compression deformity is noted. No soft tissue abnormality is seen. IMPRESSION: Postsurgical changes stable in appearance from the prior exam. No acute abnormality noted. Electronically Signed   By: Inez Catalina M.D.   On: 04/14/2021 23:59   DG Knee 1-2 Views Right  Result Date: 04/15/2021 CLINICAL DATA:  Bilateral leg weakness for the past 2 weeks. No known injury. EXAM: RIGHT KNEE - 1-2 VIEW COMPARISON:  Plain film of the RIGHT knee dated 02/03/2016. FINDINGS: Lucency within the distal RIGHT femur diaphysis, most suggestive of acute nondisplaced fracture line. Overall osseous alignment is normal. RIGHT knee arthroplasty hardware appears intact and appropriately positioned. IMPRESSION: 1. Acute-appearing nondisplaced fracture line within the distal RIGHT femur diaphysis. 2. RIGHT knee arthroplasty hardware appears intact and appropriately positioned. Electronically Signed   By: Franki Cabot M.D.   On: 04/15/2021 09:21   DG Ankle Complete Right  Result Date: 04/15/2021 CLINICAL DATA:  Recent falls with right ankle pain, initial encounter EXAM: RIGHT ANKLE - COMPLETE 3+ VIEW COMPARISON:  None. FINDINGS: Mild tarsal degenerative changes are seen. Tibiotalar degenerative changes noted. No acute fracture or dislocation is seen. No soft tissue abnormality is noted. IMPRESSION: Tarsal degenerative changes without acute bony abnormality. Electronically Signed   By: Inez Catalina M.D.   On: 04/15/2021 00:00   CT Head Wo Contrast  Result Date: 04/14/2021 CLINICAL DATA:  70 year old female with neck pain. EXAM: CT HEAD WITHOUT CONTRAST CT CERVICAL SPINE WITHOUT CONTRAST TECHNIQUE: Multidetector CT imaging of the  head and cervical spine was  performed following the standard protocol without intravenous contrast. Multiplanar CT image reconstructions of the cervical spine were also generated. COMPARISON:  Head CT dated 11/09/2020. FINDINGS: CT HEAD FINDINGS Brain: Mild age-related atrophy and chronic microvascular ischemic changes. There is no acute intracranial hemorrhage. No mass effect or midline shift no extra-axial fluid collection. Vascular: No hyperdense vessel or unexpected calcification. Skull: Normal. Negative for fracture or focal lesion. Sinuses/Orbits: The visualized paranasal sinuses and mastoid air cells are clear. Postsurgical changes of left maxillary sinus. Retained tooth in the left maxillary sinus as seen previously. Other: None CT CERVICAL SPINE FINDINGS Alignment: No acute subluxation Skull base and vertebrae: No acute fracture. Old-appearing compression of C5 and C6. Osteopenia. Soft tissues and spinal canal: No prevertebral fluid or swelling. No visible canal hematoma. Disc levels: Multilevel degenerative changes with disc space narrowing and endplate irregularity and spurring primarily at C6-C7. Upper chest: Negative. Other: None IMPRESSION: 1. No acute intracranial pathology. Mild age-related atrophy and chronic microvascular ischemic changes. 2. No acute/traumatic cervical spine pathology. Multilevel degenerative changes. Electronically Signed   By: Anner Crete M.D.   On: 04/14/2021 23:28   CT CERVICAL SPINE WO CONTRAST  Result Date: 04/14/2021 CLINICAL DATA:  70 year old female with neck pain. EXAM: CT HEAD WITHOUT CONTRAST CT CERVICAL SPINE WITHOUT CONTRAST TECHNIQUE: Multidetector CT imaging of the head and cervical spine was performed following the standard protocol without intravenous contrast. Multiplanar CT image reconstructions of the cervical spine were also generated. COMPARISON:  Head CT dated 11/09/2020. FINDINGS: CT HEAD FINDINGS Brain: Mild age-related atrophy and chronic microvascular ischemic changes.  There is no acute intracranial hemorrhage. No mass effect or midline shift no extra-axial fluid collection. Vascular: No hyperdense vessel or unexpected calcification. Skull: Normal. Negative for fracture or focal lesion. Sinuses/Orbits: The visualized paranasal sinuses and mastoid air cells are clear. Postsurgical changes of left maxillary sinus. Retained tooth in the left maxillary sinus as seen previously. Other: None CT CERVICAL SPINE FINDINGS Alignment: No acute subluxation Skull base and vertebrae: No acute fracture. Old-appearing compression of C5 and C6. Osteopenia. Soft tissues and spinal canal: No prevertebral fluid or swelling. No visible canal hematoma. Disc levels: Multilevel degenerative changes with disc space narrowing and endplate irregularity and spurring primarily at C6-C7. Upper chest: Negative. Other: None IMPRESSION: 1. No acute intracranial pathology. Mild age-related atrophy and chronic microvascular ischemic changes. 2. No acute/traumatic cervical spine pathology. Multilevel degenerative changes. Electronically Signed   By: Anner Crete M.D.   On: 04/14/2021 23:28   CT Lumbar Spine Wo Contrast  Result Date: 04/15/2021 CLINICAL DATA:  Lumbar compression fracture EXAM: CT LUMBAR SPINE WITHOUT CONTRAST TECHNIQUE: Multidetector CT imaging of the lumbar spine was performed without intravenous contrast administration. Multiplanar CT image reconstructions were also generated. COMPARISON:  Radiography from yesterday FINDINGS: Segmentation: 5 lumbar type vertebrae Alignment: Normal Vertebrae: L3-L5 solid arthrodesis and no evident hardware complication. Spinal cord stimulator which enter the canal posteriorly at T12-L1. Paraspinal and other soft tissues: No evidence of hemorrhage or mass about the spine. Expected postoperative scarring. Disc levels: L2-3 adjacent segment ligamentous thickening and facet spurring with moderate spinal stenosis. L5-S1 mild disc narrowing and bulging. IMPRESSION:  1. No acute finding. 2. L3-L5 solid arthrodesis. 3. 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 Electronically Signed   By: Monte Fantasia M.D.   On: 04/15/2021 05:04   DG Hip Unilat W or Wo Pelvis 2-3 Views Right  Result Date: 04/15/2021 CLINICAL DATA:  Right hip pain following fall,  initial encounter EXAM: DG HIP (WITH OR WITHOUT PELVIS) 3V RIGHT COMPARISON:  None. FINDINGS: Pelvic ring is intact. Postsurgical changes in the lower lumbar spine are seen. No acute fracture or dislocation is noted. No soft tissue abnormality is seen. IMPRESSION: No acute abnormality noted. Electronically Signed   By: Inez Catalina M.D.   On: 04/15/2021 00:01    Procedures Procedures   Medications Ordered in ED Medications  enoxaparin (LOVENOX) injection 40 mg (has no administration in time range)  sodium chloride flush (NS) 0.9 % injection 3 mL (has no administration in time range)  acetaminophen (TYLENOL) tablet 650 mg (has no administration in time range)    Or  acetaminophen (TYLENOL) suppository 650 mg (has no administration in time range)  ondansetron (ZOFRAN) tablet 4 mg (has no administration in time range)    Or  ondansetron (ZOFRAN) injection 4 mg (has no administration in time range)  albuterol (PROVENTIL) (2.5 MG/3ML) 0.083% nebulizer solution 2.5 mg (has no administration in time range)  insulin aspart (novoLOG) injection 0-15 Units (2 Units Subcutaneous Given 04/15/21 0934)  aspirin EC tablet 81 mg (has no administration in time range)  traMADol (ULTRAM) tablet 50 mg (has no administration in time range)  amLODipine (NORVASC) tablet 5 mg (has no administration in time range)  atorvastatin (LIPITOR) tablet 40 mg (has no administration in time range)  cloNIDine (CATAPRES) tablet 0.1 mg (has no administration in time range)  isosorbide mononitrate (IMDUR) 24 hr tablet 60 mg (has no administration in time range)  metoprolol succinate (TOPROL-XL) 24 hr tablet 50 mg (has no administration in time  range)  levothyroxine (SYNTHROID) tablet 112 mcg (has no administration in time range)  pantoprazole (PROTONIX) EC tablet 40 mg (has no administration in time range)  Loteprednol Etabonate 0.38 % GEL 1 drop (has no administration in time range)  insulin glargine (LANTUS) injection 20 Units (has no administration in time range)  iohexol (OMNIPAQUE) 9 MG/ML oral solution 500 mL (has no administration in time range)  LORazepam (ATIVAN) injection 1 mg (1 mg Intravenous Given 04/15/21 0934)    ED Course  I have reviewed the triage vital signs and the nursing notes.  Pertinent labs & imaging results that were available during my care of the patient were reviewed by me and considered in my medical decision making (see chart for details).    MDM Rules/Calculators/A&P                          70 year old female with a history of HTN, HLD, CAD, hypothyroidism, diabetes mellitus type 2, CKD, left-sided cervical radiculopathy who presents to the emergency department with a chief complaint of chest pain.  Family also adds that she has been having frequent falls and weakness in the right leg over the last 2 weeks.  She has been laying in bed for the last few days due to increasing difficulty walking.  She was also found to have altered mental status when she arrived in the emergency department, which is not the patient's baseline that she is alert and oriented x3 baseline  Vital signs are stable.  Labs and imaging of been reviewed and independently interpreted by me.  Chest x-ray is unremarkable.  CT head and cervical spine with no acute findings.  Hemoglobin is 9.8.  This is decreased from previous, but it does appear that she has previously had a hemoglobin of 8.8 back in February and baseline seems to be close to 10.  No metabolic derangements.  She has minimal leukocytosis of 11.  UA is unremarkable.  Given continued pain in the lumbar spine, CT lumbar spine was obtained with no acute findings.  At this  time, no explanation for the patient's altered mental status.  Plan for MR brain given frequent falls, but patient has a neurostimulator in place that is not MRI compatible.  Given her continued symptoms, will consult the hospitalist team for admission for altered mental status and frequent falls.  Consult to the hospitalist team and Dr. Tamala Julian will accept the patient for admission. The patient appears reasonably stabilized for admission considering the current resources, flow, and capabilities available in the ED at this time, and I doubt any other [facility] requiring further screening and/or treatment in the ED prior to admission. Final Clinical Impression(s) / ED Diagnoses Final diagnoses:  Altered mental status, unspecified altered mental status type  Frequent falls    Rx / DC Orders ED Discharge Orders     None        Joanne Gavel, PA-C 04/15/21 0957    Luna Fuse, MD 04/27/21 1120

## 2021-04-14 NOTE — ED Triage Notes (Signed)
Pt comes via [facility] for CP that started about an hour ago, took one nitro at home with no relief, also c/o of bilateral leg weakness for the past 2 weeks.

## 2021-04-15 ENCOUNTER — Emergency Department (HOSPITAL_COMMUNITY): Payer: Medicare Other

## 2021-04-15 ENCOUNTER — Observation Stay (HOSPITAL_COMMUNITY): Payer: Medicare Other

## 2021-04-15 ENCOUNTER — Inpatient Hospital Stay (HOSPITAL_COMMUNITY): Payer: Medicare Other

## 2021-04-15 ENCOUNTER — Encounter (HOSPITAL_COMMUNITY): Payer: Self-pay

## 2021-04-15 DIAGNOSIS — D539 Nutritional anemia, unspecified: Secondary | ICD-10-CM | POA: Diagnosis not present

## 2021-04-15 DIAGNOSIS — E785 Hyperlipidemia, unspecified: Secondary | ICD-10-CM | POA: Diagnosis not present

## 2021-04-15 DIAGNOSIS — E890 Postprocedural hypothyroidism: Secondary | ICD-10-CM

## 2021-04-15 DIAGNOSIS — I129 Hypertensive chronic kidney disease with stage 1 through stage 4 chronic kidney disease, or unspecified chronic kidney disease: Secondary | ICD-10-CM | POA: Diagnosis present

## 2021-04-15 DIAGNOSIS — R296 Repeated falls: Secondary | ICD-10-CM

## 2021-04-15 DIAGNOSIS — D62 Acute posthemorrhagic anemia: Secondary | ICD-10-CM | POA: Diagnosis not present

## 2021-04-15 DIAGNOSIS — N1832 Chronic kidney disease, stage 3b: Secondary | ICD-10-CM | POA: Diagnosis not present

## 2021-04-15 DIAGNOSIS — Z9181 History of falling: Secondary | ICD-10-CM | POA: Diagnosis not present

## 2021-04-15 DIAGNOSIS — E1142 Type 2 diabetes mellitus with diabetic polyneuropathy: Secondary | ICD-10-CM | POA: Diagnosis not present

## 2021-04-15 DIAGNOSIS — Z96651 Presence of right artificial knee joint: Secondary | ICD-10-CM | POA: Diagnosis not present

## 2021-04-15 DIAGNOSIS — M79604 Pain in right leg: Secondary | ICD-10-CM

## 2021-04-15 DIAGNOSIS — R1032 Left lower quadrant pain: Secondary | ICD-10-CM | POA: Diagnosis not present

## 2021-04-15 DIAGNOSIS — R4182 Altered mental status, unspecified: Secondary | ICD-10-CM | POA: Diagnosis not present

## 2021-04-15 DIAGNOSIS — G9341 Metabolic encephalopathy: Secondary | ICD-10-CM | POA: Diagnosis not present

## 2021-04-15 DIAGNOSIS — Y92009 Unspecified place in unspecified non-institutional (private) residence as the place of occurrence of the external cause: Secondary | ICD-10-CM | POA: Diagnosis not present

## 2021-04-15 DIAGNOSIS — M25511 Pain in right shoulder: Secondary | ICD-10-CM | POA: Diagnosis not present

## 2021-04-15 DIAGNOSIS — M9711XA Periprosthetic fracture around internal prosthetic right knee joint, initial encounter: Secondary | ICD-10-CM | POA: Diagnosis not present

## 2021-04-15 DIAGNOSIS — Z9049 Acquired absence of other specified parts of digestive tract: Secondary | ICD-10-CM | POA: Diagnosis not present

## 2021-04-15 DIAGNOSIS — E7849 Other hyperlipidemia: Secondary | ICD-10-CM | POA: Diagnosis not present

## 2021-04-15 DIAGNOSIS — R102 Pelvic and perineal pain: Secondary | ICD-10-CM | POA: Diagnosis not present

## 2021-04-15 DIAGNOSIS — R410 Disorientation, unspecified: Secondary | ICD-10-CM | POA: Diagnosis not present

## 2021-04-15 DIAGNOSIS — K439 Ventral hernia without obstruction or gangrene: Secondary | ICD-10-CM | POA: Diagnosis not present

## 2021-04-15 DIAGNOSIS — Z20822 Contact with and (suspected) exposure to covid-19: Secondary | ICD-10-CM | POA: Diagnosis not present

## 2021-04-15 DIAGNOSIS — S72401D Unspecified fracture of lower end of right femur, subsequent encounter for closed fracture with routine healing: Secondary | ICD-10-CM | POA: Diagnosis not present

## 2021-04-15 DIAGNOSIS — E1122 Type 2 diabetes mellitus with diabetic chronic kidney disease: Secondary | ICD-10-CM | POA: Diagnosis not present

## 2021-04-15 DIAGNOSIS — E875 Hyperkalemia: Secondary | ICD-10-CM | POA: Diagnosis not present

## 2021-04-15 DIAGNOSIS — Z7401 Bed confinement status: Secondary | ICD-10-CM | POA: Diagnosis not present

## 2021-04-15 DIAGNOSIS — E039 Hypothyroidism, unspecified: Secondary | ICD-10-CM | POA: Diagnosis not present

## 2021-04-15 DIAGNOSIS — K4090 Unilateral inguinal hernia, without obstruction or gangrene, not specified as recurrent: Secondary | ICD-10-CM | POA: Diagnosis not present

## 2021-04-15 DIAGNOSIS — Z6841 Body Mass Index (BMI) 40.0 and over, adult: Secondary | ICD-10-CM | POA: Diagnosis not present

## 2021-04-15 DIAGNOSIS — M48061 Spinal stenosis, lumbar region without neurogenic claudication: Secondary | ICD-10-CM | POA: Diagnosis not present

## 2021-04-15 DIAGNOSIS — R627 Adult failure to thrive: Secondary | ICD-10-CM | POA: Diagnosis present

## 2021-04-15 DIAGNOSIS — D509 Iron deficiency anemia, unspecified: Secondary | ICD-10-CM | POA: Diagnosis not present

## 2021-04-15 DIAGNOSIS — R634 Abnormal weight loss: Secondary | ICD-10-CM | POA: Diagnosis not present

## 2021-04-15 DIAGNOSIS — N183 Chronic kidney disease, stage 3 unspecified: Secondary | ICD-10-CM | POA: Diagnosis not present

## 2021-04-15 DIAGNOSIS — K5730 Diverticulosis of large intestine without perforation or abscess without bleeding: Secondary | ICD-10-CM | POA: Diagnosis not present

## 2021-04-15 DIAGNOSIS — D72829 Elevated white blood cell count, unspecified: Secondary | ICD-10-CM | POA: Diagnosis not present

## 2021-04-15 DIAGNOSIS — S72491D Other fracture of lower end of right femur, subsequent encounter for closed fracture with routine healing: Secondary | ICD-10-CM | POA: Diagnosis not present

## 2021-04-15 DIAGNOSIS — S72451A Displaced supracondylar fracture without intracondylar extension of lower end of right femur, initial encounter for closed fracture: Secondary | ICD-10-CM | POA: Diagnosis not present

## 2021-04-15 DIAGNOSIS — Z471 Aftercare following joint replacement surgery: Secondary | ICD-10-CM | POA: Diagnosis not present

## 2021-04-15 DIAGNOSIS — W1839XA Other fall on same level, initial encounter: Secondary | ICD-10-CM | POA: Diagnosis present

## 2021-04-15 DIAGNOSIS — Z8249 Family history of ischemic heart disease and other diseases of the circulatory system: Secondary | ICD-10-CM | POA: Diagnosis not present

## 2021-04-15 DIAGNOSIS — E119 Type 2 diabetes mellitus without complications: Secondary | ICD-10-CM | POA: Diagnosis not present

## 2021-04-15 DIAGNOSIS — R531 Weakness: Secondary | ICD-10-CM | POA: Diagnosis not present

## 2021-04-15 DIAGNOSIS — Z87891 Personal history of nicotine dependence: Secondary | ICD-10-CM | POA: Diagnosis not present

## 2021-04-15 DIAGNOSIS — R079 Chest pain, unspecified: Secondary | ICD-10-CM | POA: Diagnosis not present

## 2021-04-15 DIAGNOSIS — E0580 Other thyrotoxicosis without thyrotoxic crisis or storm: Secondary | ICD-10-CM | POA: Diagnosis not present

## 2021-04-15 DIAGNOSIS — M5127 Other intervertebral disc displacement, lumbosacral region: Secondary | ICD-10-CM | POA: Diagnosis not present

## 2021-04-15 DIAGNOSIS — Z90710 Acquired absence of both cervix and uterus: Secondary | ICD-10-CM | POA: Diagnosis not present

## 2021-04-15 DIAGNOSIS — N1831 Chronic kidney disease, stage 3a: Secondary | ICD-10-CM | POA: Diagnosis not present

## 2021-04-15 DIAGNOSIS — Z981 Arthrodesis status: Secondary | ICD-10-CM | POA: Diagnosis not present

## 2021-04-15 DIAGNOSIS — I672 Cerebral atherosclerosis: Secondary | ICD-10-CM | POA: Diagnosis not present

## 2021-04-15 DIAGNOSIS — D72828 Other elevated white blood cell count: Secondary | ICD-10-CM | POA: Diagnosis present

## 2021-04-15 DIAGNOSIS — E1143 Type 2 diabetes mellitus with diabetic autonomic (poly)neuropathy: Secondary | ICD-10-CM | POA: Diagnosis present

## 2021-04-15 DIAGNOSIS — S32010A Wedge compression fracture of first lumbar vertebra, initial encounter for closed fracture: Secondary | ICD-10-CM | POA: Diagnosis not present

## 2021-04-15 DIAGNOSIS — Z833 Family history of diabetes mellitus: Secondary | ICD-10-CM | POA: Diagnosis not present

## 2021-04-15 DIAGNOSIS — Z803 Family history of malignant neoplasm of breast: Secondary | ICD-10-CM | POA: Diagnosis not present

## 2021-04-15 LAB — COMPREHENSIVE METABOLIC PANEL
ALT: 13 U/L (ref 0–44)
AST: 14 U/L — ABNORMAL LOW (ref 15–41)
Albumin: 3.1 g/dL — ABNORMAL LOW (ref 3.5–5.0)
Alkaline Phosphatase: 84 U/L (ref 38–126)
Anion gap: 8 (ref 5–15)
BUN: 15 mg/dL (ref 8–23)
CO2: 22 mmol/L (ref 22–32)
Calcium: 9.6 mg/dL (ref 8.9–10.3)
Chloride: 109 mmol/L (ref 98–111)
Creatinine, Ser: 1.16 mg/dL — ABNORMAL HIGH (ref 0.44–1.00)
GFR, Estimated: 50 mL/min — ABNORMAL LOW (ref >60)
Glucose, Bld: 138 mg/dL — ABNORMAL HIGH (ref 70–99)
Potassium: 3.7 mmol/L (ref 3.5–5.1)
Sodium: 139 mmol/L (ref 135–145)
Total Bilirubin: 1.2 mg/dL (ref 0.3–1.2)
Total Protein: 7 g/dL (ref 6.5–8.1)

## 2021-04-15 LAB — C-REACTIVE PROTEIN: CRP: 1.7 mg/dL — ABNORMAL HIGH (ref <1.0)

## 2021-04-15 LAB — CBC WITH DIFFERENTIAL/PLATELET
Abs Immature Granulocytes: 0.09 10*3/uL — ABNORMAL HIGH (ref 0.00–0.07)
Basophils Absolute: 0.1 10*3/uL (ref 0.0–0.1)
Basophils Relative: 1 %
Eosinophils Absolute: 0.2 10*3/uL (ref 0.0–0.5)
Eosinophils Relative: 2 %
HCT: 30.2 % — ABNORMAL LOW (ref 36.0–46.0)
Hemoglobin: 9.8 g/dL — ABNORMAL LOW (ref 12.0–15.0)
Immature Granulocytes: 1 %
Lymphocytes Relative: 24 %
Lymphs Abs: 2.6 10*3/uL (ref 0.7–4.0)
MCH: 32.9 pg (ref 26.0–34.0)
MCHC: 32.5 g/dL (ref 30.0–36.0)
MCV: 101.3 fL — ABNORMAL HIGH (ref 80.0–100.0)
Monocytes Absolute: 0.6 10*3/uL (ref 0.1–1.0)
Monocytes Relative: 6 %
Neutro Abs: 7.5 10*3/uL (ref 1.7–7.7)
Neutrophils Relative %: 66 %
Platelets: 346 10*3/uL (ref 150–400)
RBC: 2.98 MIL/uL — ABNORMAL LOW (ref 3.87–5.11)
RDW: 13.6 % (ref 11.5–15.5)
WBC: 11.1 10*3/uL — ABNORMAL HIGH (ref 4.0–10.5)
nRBC: 0.3 % — ABNORMAL HIGH (ref 0.0–0.2)

## 2021-04-15 LAB — T4, FREE: Free T4: 1.67 ng/dL — ABNORMAL HIGH (ref 0.61–1.12)

## 2021-04-15 LAB — URINALYSIS, ROUTINE W REFLEX MICROSCOPIC
Bilirubin Urine: NEGATIVE
Glucose, UA: NEGATIVE mg/dL
Hgb urine dipstick: NEGATIVE
Ketones, ur: NEGATIVE mg/dL
Leukocytes,Ua: NEGATIVE
Nitrite: NEGATIVE
Protein, ur: NEGATIVE mg/dL
Specific Gravity, Urine: 1.01 (ref 1.005–1.030)
pH: 6 (ref 5.0–8.0)

## 2021-04-15 LAB — ECHOCARDIOGRAM LIMITED
AR max vel: 2.5 cm2
AV Area VTI: 2.41 cm2
AV Area mean vel: 2.44 cm2
AV Mean grad: 4 mmHg
AV Peak grad: 6.6 mmHg
Ao pk vel: 1.28 m/s
Area-P 1/2: 5.23 cm2
Height: 66 in
S' Lateral: 1.9 cm
Weight: 4160 oz

## 2021-04-15 LAB — HEMOGLOBIN AND HEMATOCRIT, BLOOD
HCT: 33.3 % — ABNORMAL LOW (ref 36.0–46.0)
Hemoglobin: 10.9 g/dL — ABNORMAL LOW (ref 12.0–15.0)

## 2021-04-15 LAB — RESP PANEL BY RT-PCR (FLU A&B, COVID) ARPGX2
Influenza A by PCR: NEGATIVE
Influenza B by PCR: NEGATIVE
SARS Coronavirus 2 by RT PCR: NEGATIVE

## 2021-04-15 LAB — CBG MONITORING, ED
Glucose-Capillary: 139 mg/dL — ABNORMAL HIGH (ref 70–99)
Glucose-Capillary: 121 mg/dL — ABNORMAL HIGH (ref 70–99)

## 2021-04-15 LAB — FOLATE: Folate: 15.3 ng/mL (ref >5.9)

## 2021-04-15 LAB — HEMOGLOBIN A1C
Hgb A1c MFr Bld: 7 % — ABNORMAL HIGH (ref 4.8–5.6)
Mean Plasma Glucose: 154.2 mg/dL

## 2021-04-15 LAB — GLUCOSE, CAPILLARY
Glucose-Capillary: 118 mg/dL — ABNORMAL HIGH (ref 70–99)
Glucose-Capillary: 120 mg/dL — ABNORMAL HIGH (ref 70–99)

## 2021-04-15 LAB — TROPONIN I (HIGH SENSITIVITY)
Troponin I (High Sensitivity): 9 ng/L (ref <18)
Troponin I (High Sensitivity): 10 ng/L (ref <18)

## 2021-04-15 LAB — VITAMIN B12: Vitamin B-12: 915 pg/mL — ABNORMAL HIGH (ref 180–914)

## 2021-04-15 LAB — TSH: TSH: 0.11 u[IU]/mL — ABNORMAL LOW (ref 0.350–4.500)

## 2021-04-15 LAB — SEDIMENTATION RATE: Sed Rate: 109 mm/hr — ABNORMAL HIGH (ref 0–22)

## 2021-04-15 MED ORDER — LORAZEPAM 2 MG/ML IJ SOLN
1.0000 mg | Freq: Once | INTRAMUSCULAR | Status: AC
  Administered 2021-04-15: 1 mg via INTRAVENOUS
  Filled 2021-04-15: qty 1

## 2021-04-15 MED ORDER — ASPIRIN EC 81 MG PO TBEC
81.0000 mg | DELAYED_RELEASE_TABLET | Freq: Every day | ORAL | Status: DC
  Administered 2021-04-15 – 2021-04-24 (×10): 81 mg via ORAL
  Filled 2021-04-15 (×10): qty 1

## 2021-04-15 MED ORDER — PANTOPRAZOLE SODIUM 40 MG PO TBEC
40.0000 mg | DELAYED_RELEASE_TABLET | Freq: Two times a day (BID) | ORAL | Status: DC
  Administered 2021-04-15 – 2021-04-24 (×20): 40 mg via ORAL
  Filled 2021-04-15 (×20): qty 1

## 2021-04-15 MED ORDER — ALBUTEROL SULFATE (2.5 MG/3ML) 0.083% IN NEBU: 2.5000 mg | INHALATION_SOLUTION | Freq: Four times a day (QID) | RESPIRATORY_TRACT | Status: DC | PRN

## 2021-04-15 MED ORDER — MELATONIN 3 MG PO TABS
3.0000 mg | ORAL_TABLET | Freq: Once | ORAL | Status: AC
  Administered 2021-04-15: 3 mg via ORAL
  Filled 2021-04-15: qty 1

## 2021-04-15 MED ORDER — LOTEPREDNOL ETABONATE 0.38 % OP GEL: 1.0000 [drp] | Freq: Three times a day (TID) | OPHTHALMIC | Status: DC

## 2021-04-15 MED ORDER — GABAPENTIN 300 MG PO CAPS
600.0000 mg | ORAL_CAPSULE | Freq: Two times a day (BID) | ORAL | Status: DC
  Administered 2021-04-15 – 2021-04-16 (×2): 600 mg via ORAL
  Filled 2021-04-15 (×2): qty 2

## 2021-04-15 MED ORDER — ONDANSETRON HCL 4 MG PO TABS: 4.0000 mg | ORAL_TABLET | Freq: Four times a day (QID) | ORAL | Status: DC | PRN

## 2021-04-15 MED ORDER — IOHEXOL 9 MG/ML PO SOLN: 500.0000 mL | ORAL | Status: AC

## 2021-04-15 MED ORDER — LEVOTHYROXINE SODIUM 112 MCG PO TABS
112.0000 ug | ORAL_TABLET | Freq: Every day | ORAL | Status: DC
  Administered 2021-04-15: 112 ug via ORAL
  Filled 2021-04-15: qty 1

## 2021-04-15 MED ORDER — MEGESTROL ACETATE 40 MG PO TABS: 40.0000 mg | ORAL_TABLET | Freq: Every day | ORAL | Status: DC

## 2021-04-15 MED ORDER — GABAPENTIN 400 MG PO CAPS
1200.0000 mg | ORAL_CAPSULE | Freq: Every day | ORAL | Status: DC
  Administered 2021-04-15: 1200 mg via ORAL
  Filled 2021-04-15: qty 3

## 2021-04-15 MED ORDER — ENOXAPARIN SODIUM 40 MG/0.4ML IJ SOSY
40.0000 mg | PREFILLED_SYRINGE | INTRAMUSCULAR | Status: DC
  Administered 2021-04-15: 40 mg via SUBCUTANEOUS
  Filled 2021-04-15: qty 0.4

## 2021-04-15 MED ORDER — ACETAMINOPHEN 650 MG RE SUPP: 650.0000 mg | Freq: Four times a day (QID) | RECTAL | Status: DC | PRN

## 2021-04-15 MED ORDER — GABAPENTIN 600 MG PO TABS: 600.0000 mg | ORAL_TABLET | ORAL | Status: DC

## 2021-04-15 MED ORDER — SODIUM CHLORIDE 0.9% FLUSH
3.0000 mL | Freq: Two times a day (BID) | INTRAVENOUS | Status: DC
  Administered 2021-04-15 – 2021-04-24 (×16): 3 mL via INTRAVENOUS

## 2021-04-15 MED ORDER — METOPROLOL SUCCINATE ER 50 MG PO TB24
50.0000 mg | ORAL_TABLET | Freq: Every day | ORAL | Status: DC
  Administered 2021-04-15 – 2021-04-24 (×10): 50 mg via ORAL
  Filled 2021-04-15 (×8): qty 1
  Filled 2021-04-15: qty 2
  Filled 2021-04-15: qty 1

## 2021-04-15 MED ORDER — INSULIN ASPART 100 UNIT/ML IJ SOLN
0.0000 [IU] | Freq: Three times a day (TID) | INTRAMUSCULAR | Status: DC
  Administered 2021-04-15 – 2021-04-16 (×3): 2 [IU] via SUBCUTANEOUS
  Administered 2021-04-16 – 2021-04-17 (×3): 3 [IU] via SUBCUTANEOUS
  Administered 2021-04-17 – 2021-04-18 (×2): 2 [IU] via SUBCUTANEOUS
  Administered 2021-04-18: 3 [IU] via SUBCUTANEOUS
  Administered 2021-04-18: 2 [IU] via SUBCUTANEOUS
  Administered 2021-04-19 (×3): 3 [IU] via SUBCUTANEOUS
  Administered 2021-04-20 (×2): 2 [IU] via SUBCUTANEOUS
  Administered 2021-04-20: 8 [IU] via SUBCUTANEOUS
  Administered 2021-04-21 – 2021-04-23 (×7): 5 [IU] via SUBCUTANEOUS
  Administered 2021-04-23: 3 [IU] via SUBCUTANEOUS
  Administered 2021-04-23: 5 [IU] via SUBCUTANEOUS
  Administered 2021-04-24 (×2): 3 [IU] via SUBCUTANEOUS

## 2021-04-15 MED ORDER — ISOSORBIDE MONONITRATE ER 60 MG PO TB24
60.0000 mg | ORAL_TABLET | Freq: Every day | ORAL | Status: DC
  Administered 2021-04-15 – 2021-04-24 (×10): 60 mg via ORAL
  Filled 2021-04-15 (×7): qty 1
  Filled 2021-04-15: qty 2
  Filled 2021-04-15 (×3): qty 1

## 2021-04-15 MED ORDER — AMLODIPINE BESYLATE 5 MG PO TABS
5.0000 mg | ORAL_TABLET | Freq: Every day | ORAL | Status: DC
  Administered 2021-04-15 – 2021-04-16 (×2): 5 mg via ORAL
  Filled 2021-04-15 (×2): qty 1

## 2021-04-15 MED ORDER — INSULIN GLARGINE 100 UNIT/ML ~~LOC~~ SOLN
20.0000 [IU] | Freq: Every day | SUBCUTANEOUS | Status: DC
  Administered 2021-04-15 – 2021-04-22 (×8): 20 [IU] via SUBCUTANEOUS
  Filled 2021-04-15 (×8): qty 0.2

## 2021-04-15 MED ORDER — CLONIDINE HCL 0.1 MG PO TABS
0.1000 mg | ORAL_TABLET | Freq: Two times a day (BID) | ORAL | Status: DC
  Administered 2021-04-15 – 2021-04-22 (×16): 0.1 mg via ORAL
  Filled 2021-04-15 (×16): qty 1

## 2021-04-15 MED ORDER — ONDANSETRON HCL 4 MG/2ML IJ SOLN: 4.0000 mg | Freq: Four times a day (QID) | INTRAMUSCULAR | Status: DC | PRN

## 2021-04-15 MED ORDER — SODIUM CHLORIDE 0.9 % IV SOLN: INTRAVENOUS | Status: DC

## 2021-04-15 MED ORDER — ATORVASTATIN CALCIUM 40 MG PO TABS
40.0000 mg | ORAL_TABLET | Freq: Every day | ORAL | Status: DC
  Administered 2021-04-15 – 2021-04-24 (×10): 40 mg via ORAL
  Filled 2021-04-15 (×10): qty 1

## 2021-04-15 MED ORDER — TRAMADOL HCL 50 MG PO TABS
50.0000 mg | ORAL_TABLET | Freq: Two times a day (BID) | ORAL | Status: DC | PRN
  Administered 2021-04-15 (×2): 50 mg via ORAL
  Filled 2021-04-15 (×2): qty 1

## 2021-04-15 MED ORDER — ACETAMINOPHEN 325 MG PO TABS: 650.0000 mg | ORAL_TABLET | Freq: Four times a day (QID) | ORAL | Status: DC | PRN

## 2021-04-15 NOTE — ED Notes (Signed)
Attempted to call nursing report to 2 W 

## 2021-04-15 NOTE — ED Notes (Signed)
The pt returned from c-t 

## 2021-04-15 NOTE — Progress Notes (Unsigned)
Pt has SC1160 stimulator with 2317-70(cm) leads. The 70cm leads are not compatible, the 2317-50 (cm) are. This prevents the pt from being able to proceed with any MRIs.

## 2021-04-15 NOTE — ED Notes (Signed)
Attempted to feed pt lunch. She will only eat a few bites of mac-n-cheese and states, "well I thought it'd be good, but it isn't. I don't believe I want anymore." Will try again later.

## 2021-04-15 NOTE — Progress Notes (Signed)
  Echocardiogram 2D Echocardiogram has been performed.  [name] F 04/15/2021, 4:11 PM

## 2021-04-15 NOTE — ED Notes (Addendum)
The pts daughter called to inquire about the pt

## 2021-04-15 NOTE — Progress Notes (Signed)
Cause of patient's symptoms and presentation is not initially clear.  Labs reveal repeat hemoglobin 10.9, TSH 0.11, free T4 1.67, ESR 109,  CRP 1.7, vitamin B12 915, hemoglobin A1c 7.  Patient was noted to have heart rates jump up to 111.  So patient sees [facility] endocrinology.  Husband had reported that her levothyroxine dose had been decreased to 75 mcg daily.  Unclear when this was done.  Will need to follow-up with patient's endocrinology in a.m. 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  We will check echocardiogram.  Follow-up telemetry overnight.  Transition patient to inpatient as work-up and need of monitoring  will require more than 2 midnight stay.

## 2021-04-15 NOTE — H&P (Addendum)
History and Physical    [name] HQP:[id number] DOB: Jun 27, 1951 DOA: 04/14/2021  Referring MD/NP/PA: Joline Maxcy, PA-C PCP: Biagio Borg, MD  Consultants: Dr. Dionne Ano Patient coming from: Home(lives with husband)  Chief Complaint: Chest pressure  I have personally briefly reviewed patient's old medical records in [place]   HPI: [name] is a 70 y.o. female with medical history significant of hypertension, hyperlipidemia, CAD, DM type II, hypothyroidism, cervical radiculopathy, chronic back pain, anxiety, depression, and GERD who presents with complaints of chest pressure which started yesterday.  History is obtained with the assistance of the patient's husband who is present at bedside as the patient has had some confusion.  Chest pressure was substernal and was not reported to radiate.  Noted associated symptoms of some nausea and shortness of breath, but denied vomiting.  Over the last couple weeks patient's husband reports that the patient has not been doing well and that he has been having to feed her because she is so weak.  Reports that she complains of numbness and tingling in her hands often dropping food.  Patient has fallen at least twice that he is aware of in the last 1-2 weeks.  She has not hit her head or loss consciousness.  Patient reports that she just feels so fatigued/tired and her husband thinks that her legs are giving way.  She has been complaining of pain in her right knee and left hip/stomach area.  [name] also has reportedly had weight loss of 10 -15 pounds per her husband's estimate.  Denies having any dysuria, palpitations, blood in stools.  He suspects some of this has to do with the patient not having her lower teeth and only being able to eat soft foods.  She is scheduled to have screws placed for dental implants on the bottom row on July 12.   Review of records note patient had been seen in the emergency department on 5/27 with  complaints of abdominal pain and found to have concern for acute diverticulitis.  She was discharged at that time on Augmentin which she took as advised.  On 6/15 patient was seen back in the emergency department with complaints of abdominal pain and again noted to have acute diverticulitis sent home on Augmentin.  Patient followed up with her PCP on 6/23, and due to persistent symptoms had been transitioned to oral Levaquin and metronidazole.  In brief talking with the patient she does report that the pain in her left lower quadrant of her stomach is never gone away.  Furthermore, it appears her levothyroxine dose has been decreased from 225 mcg daily down to 112 mcg daily by her endocrinologist in May and at that time thyroid studies were within normal.  Her husband reports that dose to his knowledge was decreased even further down to 75 mcg by Dr. Dwyane Dee.  Patient husband reports that patient usually tries to take levothyroxine at 6 AM in the morning, but will usually take the rest of her morning medications 20 minutes or so thereafter.  ED Course: Upon admission into the emergency department patient was seen to be afebrile, blood pressure is 149/67 and 150/87, and all other vital signs maintained.  CT scan of the head and cervical spine showed no acute abnormalities. Chest x-ray was clear.  Labs significant for WBC 11.1, hemoglobin 9.8, MCV 101, BUN 15, creatinine 1.16, albumin 3.1, and all other labs relatively within normal limits.  X-rays of the pelvis, right ankle, thoracic, and  lumbar spine showed no acute fractures.  Urinalysis showed no signs of infection.  Influenza and COVID-19 screening were negative.  MRI had been ordered, but patient has a neurostimulator and had to be canceled.  TR[facility] was called to admit.  Patient had been given 1 mg of Ativan IV.  Review of Systems  Constitutional:  Positive for malaise/fatigue and weight loss. Negative for fever.  HENT:  Negative for congestion and hearing  loss.   Eyes:  Negative for photophobia and pain.  Respiratory:  Positive for shortness of breath. Negative for cough.   Cardiovascular:  Positive for chest pain and leg swelling.  Gastrointestinal:  Positive for abdominal pain, constipation and nausea. Negative for blood in stool, diarrhea and vomiting.  Genitourinary:  Negative for dysuria.  Musculoskeletal:  Positive for falls, joint pain and myalgias.  Skin:  Negative for rash.  Neurological:  Positive for weakness. Negative for loss of consciousness.  Endo/Heme/Allergies:  Negative for polydipsia.  Psychiatric/Behavioral:  Positive for memory loss. Negative for substance abuse.    Past Medical History:  Diagnosis Date   Anxiety    Arthritis    Blood dyscrasia    "FREE BLEEDER"   CERVICAL RADICULOPATHY, LEFT    Chronic back pain    CKD (chronic kidney disease)    DR. SANFORD  [facility]   COMMON MIGRAINE    CORONARY ARTERY DISEASE    Cough    CURRENT COLD   Cystitis    DEPRESSION    Diabetes mellitus, type II (HCC)    DIVERTICULOSIS-COLON    Dysrhythmia    palpitations   Gastric ulcer 04/2008   Gastroparesis    GERD (gastroesophageal reflux disease)    Hiatal hernia    Hyperlipidemia    Hypertension    Hypothyroidism    INSOMNIA-SLEEP DISORDER-UNSPEC    Iron deficiency anemia    Wears glasses     Past Surgical History:  Procedure Laterality Date   ABDOMINAL HYSTERECTOMY     ANTERIOR LAT LUMBAR FUSION Left 08/13/2017   Procedure: LEFT SIDED LUMBAR 3-4 LATERAL INTERBODY FUSION WITH INSTRUMENTATION AND ALLOGRAFT;  Surgeon: Phylliss Bob, MD;  Location: [facility];  Service: Orthopedics;  Laterality: Left;  LEFT SIDED LUMBAR 3-4 LATERAL INTERBODY FUSION WITH INSTRUMENTATION AND ALLOGRAFT; REQUEST 3 HOURS   Back Stimulator  07/2018   BACK SURGERY  03/2016   BREAST EXCISIONAL BIOPSY Right    40 years ago   CHOLECYSTECTOMY  06/2009   COLONOSCOPY     ESOPHAGOGASTRODUODENOSCOPY     EYE SURGERY Bilateral    lasik    Gastric Wedge resection lipoma  11/2007   x2 with laparotomy and gastrostomy   JOINT REPLACEMENT     Left knee replacement     Rigth knee replacement with revision  04/2008   Dr. Berenice Primas   ROTATOR CUFF REPAIR Left 01/2009   s/p bladder surgury  09/2009   Dr. Terance Hart   SPINAL CORD STIMULATOR INSERTION N/A 09/11/2018   Procedure: LUMBAR SPINAL CORD STIMULATOR INSERTION;  Surgeon: Clydell Hakim, MD;  Location: [facility];  Service: Neurosurgery;  Laterality: N/A;  LUMBAR SPINAL CORD STIMULATOR INSERTION     reports that she has quit smoking. She has never used smokeless tobacco. She reports that she does not drink alcohol and does not use drugs.  Allergies  Allergen Reactions   Ciprofloxacin Shortness Of Breath and Other (See Comments)    Dizziness    Hydrocodone Shortness Of Breath and Other (See Comments)    Tachycardia  Hydrocortisone    Penicillins Hives    Pt just remembers hives, SHE HAS HAD AUGEMTIN WITHOUT ISSUES PER PATIENT no SOB or lightheadedness with hypotension: No Has patient had a PCN reaction causing severe rash involving mucus membranes or skin necrosis: No Has patient had a PCN reaction that required hospitalization No Has patient had a PCN reaction occurring within the last 10 years: No If all of the above answers are "NO", then may proceed with Cephalosporin use.     Family History  Problem Relation Age of Onset   Diabetes Sister        x 3   Heart disease Sister        x2   Breast cancer Sister 63   Diabetes Brother        x3   Heart disease Brother        x2   Coronary artery disease Other        female 1st degree   Hypertension Other    Breast cancer Sister 15   Breast cancer Other 25   Diabetes Brother    Colon cancer Neg Hx     No current facility-administered medications on file prior to encounter.   Current Outpatient Medications on File Prior to Encounter  Medication Sig Dispense Refill   amLODipine (NORVASC) 5 MG tablet Take 5 mg by mouth  daily.     aspirin 81 MG EC tablet Take 81 mg by mouth daily.     atorvastatin (LIPITOR) 40 MG tablet TAKE ONE TABLET BY MOUTH EVERY NIGHT AT BEDTIME (Patient taking differently: Take 40 mg by mouth at bedtime.) 90 tablet 3   cloNIDine (CATAPRES) 0.1 MG tablet TAKE ONE TABLET BY MOUTH TWICE A DAY (Patient taking differently: Take 0.1 mg by mouth 2 (two) times daily.) 180 tablet 0   Dulaglutide (TRULICITY) 1.5 HQ/7.5FF SOPN Inject 1.5 mg into the skin every Monday.     ELMIRON 100 MG capsule Take 200 mg by mouth 2 (two) times daily.      FLUoxetine (PROZAC) 20 MG capsule TAKE ONE CAPSULE BY MOUTH DAILY (Patient taking differently: Take 20 mg by mouth daily. TAKE ONE CAPSULE BY MOUTH DAILY) 90 capsule 3   gabapentin (NEURONTIN) 600 MG tablet Take 600-1,200 mg by mouth See admin instructions. 600 mg in the morning and midday 1200 mg at bedtime.     glucose blood (FREESTYLE TEST STRIPS) test strip Use as instructed to check blood sugar 4 times daily. 100 each 3   glucose blood test strip Use as instructed 100 each 12   Insulin Glargine (BASAGLAR KWIKPEN) 100 UNIT/ML Inject 30 Units into the skin daily.     insulin lispro (HUMALOG) 100 UNIT/ML injection Inject 3-20 Units into the skin See admin instructions. Per sliding scale 3 times daily     isosorbide mononitrate (IMDUR) 60 MG 24 hr tablet Take 1 tablet (60 mg total) by mouth daily. 90 tablet 3   levothyroxine (SYNTHROID) 112 MCG tablet Take 112 mcg by mouth daily.     linaclotide (LINZESS) 72 MCG capsule Take 1 capsule (72 mcg total) by mouth daily before breakfast. (Patient taking differently: Take 72 mcg by mouth daily as needed (constipation).) 30 capsule 11   Loteprednol Etabonate (LOTEMAX SM) 0.38 % GEL Place 1 drop into both eyes 3 (three) times daily.     megestrol (MEGACE) 40 MG tablet Take 1 tablet (40 mg total) by mouth daily. 30 tablet 0   metoprolol succinate (TOPROL-XL) 50 MG 24  hr tablet TAKE 1 TABLET BY MOUTH DAILY WITH OR IMMEDIATELY  FOLLOWING A MEAL (Patient taking differently: Take 50 mg by mouth daily.) 90 tablet 3   nitroGLYCERIN (NITROSTAT) 0.4 MG SL tablet Place 1 tablet (0.4 mg total) under the tongue every 5 (five) minutes as needed for chest pain. 20 tablet 0   ondansetron (ZOFRAN) 4 MG tablet Take 1 tablet (4 mg total) by mouth every 6 (six) hours. (Patient taking differently: Take 4 mg by mouth every 8 (eight) hours as needed for vomiting or nausea.) 12 tablet 0   pantoprazole (PROTONIX) 40 MG tablet TAKE 1 TABLET(40 MG) BY MOUTH TWICE DAILY (Patient taking differently: Take 40 mg by mouth 2 (two) times daily.) 180 tablet 3   polyethylene glycol (MIRALAX / GLYCOLAX) 17 g packet Take 17 g by mouth daily as needed. (Patient taking differently: Take 17 g by mouth daily as needed for mild constipation.) 14 each 0   tiZANidine (ZANAFLEX) 2 MG tablet Take 1 tablet (2 mg total) by mouth every 6 (six) hours as needed for muscle spasms. 40 tablet 1   traMADol (ULTRAM) 50 MG tablet Take 50 mg by mouth 2 (two) times daily as needed for moderate pain.     clindamycin (CLEOCIN) 150 MG capsule Take 150 mg by mouth See admin instructions. Qid x 6 days (Patient not taking: Reported on 04/15/2021)     Continuous Blood Gluc Receiver (FREESTYLE LIBRE 2 READER) DEVI Use as directed twice daily E11.9 1 each 0   Continuous Blood Gluc Sensor (FREESTYLE LIBRE 2 SENSOR) MISC Use as directed once bi-weekly E11.9 6 each 3   levothyroxine (SYNTHROID) 75 MCG tablet Take 3 tablets (225 mcg total) by mouth daily at 6 (six) AM. (Patient not taking: Reported on 04/15/2021) 90 tablet 0   ondansetron (ZOFRAN ODT) 4 MG disintegrating tablet Take 1 tablet (4 mg total) by mouth every 8 (eight) hours as needed for up to 15 doses for nausea or vomiting. (Patient not taking: Reported on 04/15/2021) 15 tablet 0     Physical Exam:  Constitutional: Elderly obese female who appears to be fatigue Vitals:   04/14/21 2155 04/15/21 0409  BP: (!) 149/67 (!) 150/87   Pulse: 93 87  Resp: 16 16  Temp: 99.1 F (37.3 C) 98.6 F (37 C)  TempSrc: Oral   SpO2: 99% 100%   Eyes: PERRL, lids and conjunctivae normal ENMT: Mucous membranes are dry. Posterior pharynx clear of any exudate or lesions.normal dentition on upper and edentulous on the lower Neck: normal, supple, no masses, no thyromegaly Respiratory: clear to auscultation bilaterally, no wheezing, no crackles. Normal respiratory effort. No accessory muscle use.  Cardiovascular: Regular rate and rhythm, no murmurs / rubs / gallops.  Trace lower extremity edema. 2+ pedal pulses. No carotid bruits.  Abdomen: Mild tenderness to palpation noted of the left lower quadrant of the abdomen, no masses palpated. No hepatosplenomegaly. Bowel sounds positive.  Musculoskeletal: no clubbing / cyanosis. No joint deformity upper and lower extremities. Good ROM, no contractures. Normal muscle tone.  Skin: Healed surgical scars from prior knee replacements Neurologic: CN 2-12 grossly intact. Sensation intact, DTR normal. Strength 5/5 in all 4.  Psychiatric: Alert and oriented to person and place, but not time.  Patient reports that the year is 2017 and is unable to recall the president.  Normal mood.    Labs on Admission: I have personally reviewed following labs and imaging studies  CBC: Recent Labs  Lab 04/15/21 0235  WBC  11.1*  NEUTROABS 7.5  HGB 9.8*  HCT 30.2*  MCV 101.3*  PLT 329   Basic Metabolic Panel: Recent Labs  Lab 04/15/21 0235  NA 139  K 3.7  CL 109  CO2 22  GLUCOSE 138*  BUN 15  CREATININE 1.16*  CALCIUM 9.6   GFR: CrCl cannot be calculated (Unknown ideal weight.). Liver Function Tests: Recent Labs  Lab 04/15/21 0235  AST 14*  ALT 13  ALKPHOS 84  BILITOT 1.2  PROT 7.0  ALBUMIN 3.1*   No results for input(s): LIPASE, AMYLASE in the last 168 hours. No results for input(s): AMMONIA in the last 168 hours. Coagulation Profile: No results for input(s): INR, PROTIME in the last  168 hours. Cardiac Enzymes: No results for input(s): CKTOTAL, CKMB, CKMBINDEX, TROPONINI in the last 168 hours. BNP (last 3 results) No results for input(s): PROBNP in the last 8760 hours. HbA1C: No results for input(s): HGBA1C in the last 72 hours. CBG: No results for input(s): GLUCAP in the last 168 hours. Lipid Profile: No results for input(s): CHOL, HDL, LDLCALC, TRIG, CHOLHDL, LDLDIRECT in the last 72 hours. Thyroid Function Tests: No results for input(s): TSH, T4TOTAL, FREET4, T3FREE, THYROIDAB in the last 72 hours. Anemia Panel: No results for input(s): VITAMINB12, FOLATE, FERRITIN, TIBC, IRON, RETICCTPCT in the last 72 hours. Urine analysis:    Component Value Date/Time   COLORURINE YELLOW 04/15/2021 0610   APPEARANCEUR CLEAR 04/15/2021 0610   LABSPEC 1.010 04/15/2021 0610   PHURINE 6.0 04/15/2021 0610   GLUCOSEU NEGATIVE 04/15/2021 0610   GLUCOSEU NEGATIVE 01/09/2021 1514   HGBUR NEGATIVE 04/15/2021 0610   BILIRUBINUR NEGATIVE 04/15/2021 0610   BILIRUBINUR negative 04/11/2018 1308   KETONESUR NEGATIVE 04/15/2021 0610   PROTEINUR NEGATIVE 04/15/2021 0610   UROBILINOGEN 1.0 01/09/2021 1514   NITRITE NEGATIVE 04/15/2021 0610   LEUKOCYTESUR NEGATIVE 04/15/2021 0610   Sepsis Labs: Recent Results (from the past 240 hour(s))  Resp Panel by RT-PCR (Flu A&B, Covid) Nasopharyngeal Swab     Status: None   Collection Time: 04/14/21 12:51 AM   Specimen: Nasopharyngeal Swab; Nasopharyngeal(NP) swabs in vial transport medium  Result Value Ref Range Status   SARS Coronavirus 2 by RT PCR NEGATIVE NEGATIVE Final    Comment: (NOTE) SARS-CoV-2 target nucleic acids are NOT DETECTED.  The SARS-CoV-2 RNA is generally detectable in upper respiratory specimens during the acute phase of infection. The lowest concentration of SARS-CoV-2 viral copies this assay can detect is 138 copies/mL. A negative result does not preclude SARS-Cov-2 infection and should not be used as the sole basis  for treatment or other patient management decisions. A negative result may occur with  improper specimen collection/handling, submission of specimen other than nasopharyngeal swab, presence of viral mutation(s) within the areas targeted by this assay, and inadequate number of viral copies(<138 copies/mL). A negative result must be combined with clinical observations, patient history, and epidemiological information. The expected result is Negative.  Fact Sheet for Patients:  EntrepreneurPulse.com.au  Fact Sheet for Healthcare Providers:  IncredibleEmployment.be  This test is no t yet approved or cleared by the [place] FDA and  has been authorized for detection and/or diagnosis of SARS-CoV-2 by FDA under an Emergency Use Authorization (EUA). This EUA will remain  in effect (meaning this test can be used) for the duration of the COVID-19 declaration under Section 564(b)(1) of the Act, 21 U.S.C.section 360bbb-3(b)(1), unless the authorization is terminated  or revoked sooner.       Influenza A by PCR  NEGATIVE NEGATIVE Final   Influenza B by PCR NEGATIVE NEGATIVE Final    Comment: (NOTE) The Xpert Xpress SARS-CoV-2/FLU/RSV plus assay is intended as an aid in the diagnosis of influenza from Nasopharyngeal swab specimens and should not be used as a sole basis for treatment. Nasal washings and aspirates are unacceptable for Xpert Xpress SARS-CoV-2/FLU/RSV testing.  Fact Sheet for Patients: EntrepreneurPulse.com.au  Fact Sheet for Healthcare Providers: IncredibleEmployment.be  This test is not yet approved or cleared by the [place] FDA and has been authorized for detection and/or diagnosis of SARS-CoV-2 by FDA under an Emergency Use Authorization (EUA). This EUA will remain in effect (meaning this test can be used) for the duration of the COVID-19 declaration under Section 564(b)(1) of the Act, 21  U.S.C. section 360bbb-3(b)(1), unless the authorization is terminated or revoked.  Performed at [facility], [place] [address], [place], [place] [address]      Radiological Exams on Admission: DG Chest 2 View  Result Date: 04/14/2021 CLINICAL DATA:  Chest pain for 1 hour EXAM: CHEST - 2 VIEW COMPARISON:  11/09/2020 FINDINGS: The heart size and mediastinal contours are within normal limits. Both lungs are clear. The visualized skeletal structures are unremarkable. Spinal stimulator is noted in place. IMPRESSION: No active cardiopulmonary disease. Electronically Signed   By: Inez Catalina M.D.   On: 04/14/2021 22:23   DG Thoracic Spine 2 View  Result Date: 04/15/2021 CLINICAL DATA:  Recent falls with back pain, initial encounter EXAM: THORACIC SPINE 2 VIEWS COMPARISON:  None FINDINGS: Spinal stimulator is noted. Vertebral body height is well maintained. No pedicle abnormality or paraspinal mass is noted. No definitive rib abnormality is seen. IMPRESSION: No acute abnormality noted. Electronically Signed   By: Inez Catalina M.D.   On: 04/15/2021 00:01   DG Lumbar Spine Complete  Result Date: 04/14/2021 CLINICAL DATA:  Frequent falls with low back pain, initial encounter EXAM: LUMBAR SPINE - COMPLETE 4+ VIEW COMPARISON:  04/05/2018 FINDINGS: Five lumbar type vertebral bodies are well visualized. Vertebral body height is well maintained. Changes of prior interbody fusion are noted at L3-4. L3-L5 fixation is noted pedicle screws and posterior fixation elements. Left lateral fixation of L3-4 is noted. Spinal stimulator is seen. No pars defects are noted. No compression deformity is noted. No soft tissue abnormality is seen. IMPRESSION: Postsurgical changes stable in appearance from the prior exam. No acute abnormality noted. Electronically Signed   By: Inez Catalina M.D.   On: 04/14/2021 23:59   DG Ankle Complete Right  Result Date: 04/15/2021 CLINICAL DATA:  Recent falls with right ankle pain,  initial encounter EXAM: RIGHT ANKLE - COMPLETE 3+ VIEW COMPARISON:  None. FINDINGS: Mild tarsal degenerative changes are seen. Tibiotalar degenerative changes noted. No acute fracture or dislocation is seen. No soft tissue abnormality is noted. IMPRESSION: Tarsal degenerative changes without acute bony abnormality. Electronically Signed   By: Inez Catalina M.D.   On: 04/15/2021 00:00   CT Head Wo Contrast  Result Date: 04/14/2021 CLINICAL DATA:  70 year old female with neck pain. EXAM: CT HEAD WITHOUT CONTRAST CT CERVICAL SPINE WITHOUT CONTRAST TECHNIQUE: Multidetector CT imaging of the head and cervical spine was performed following the standard protocol without intravenous contrast. Multiplanar CT image reconstructions of the cervical spine were also generated. COMPARISON:  Head CT dated 11/09/2020. FINDINGS: CT HEAD FINDINGS Brain: Mild age-related atrophy and chronic microvascular ischemic changes. There is no acute intracranial hemorrhage. No mass effect or midline shift no extra-axial fluid collection. Vascular: No  hyperdense vessel or unexpected calcification. Skull: Normal. Negative for fracture or focal lesion. Sinuses/Orbits: The visualized paranasal sinuses and mastoid air cells are clear. Postsurgical changes of left maxillary sinus. Retained tooth in the left maxillary sinus as seen previously. Other: None CT CERVICAL SPINE FINDINGS Alignment: No acute subluxation Skull base and vertebrae: No acute fracture. Old-appearing compression of C5 and C6. Osteopenia. Soft tissues and spinal canal: No prevertebral fluid or swelling. No visible canal hematoma. Disc levels: Multilevel degenerative changes with disc space narrowing and endplate irregularity and spurring primarily at C6-C7. Upper chest: Negative. Other: None IMPRESSION: 1. No acute intracranial pathology. Mild age-related atrophy and chronic microvascular ischemic changes. 2. No acute/traumatic cervical spine pathology. Multilevel degenerative  changes. Electronically Signed   By: Anner Crete M.D.   On: 04/14/2021 23:28   CT CERVICAL SPINE WO CONTRAST  Result Date: 04/14/2021 CLINICAL DATA:  70 year old female with neck pain. EXAM: CT HEAD WITHOUT CONTRAST CT CERVICAL SPINE WITHOUT CONTRAST TECHNIQUE: Multidetector CT imaging of the head and cervical spine was performed following the standard protocol without intravenous contrast. Multiplanar CT image reconstructions of the cervical spine were also generated. COMPARISON:  Head CT dated 11/09/2020. FINDINGS: CT HEAD FINDINGS Brain: Mild age-related atrophy and chronic microvascular ischemic changes. There is no acute intracranial hemorrhage. No mass effect or midline shift no extra-axial fluid collection. Vascular: No hyperdense vessel or unexpected calcification. Skull: Normal. Negative for fracture or focal lesion. Sinuses/Orbits: The visualized paranasal sinuses and mastoid air cells are clear. Postsurgical changes of left maxillary sinus. Retained tooth in the left maxillary sinus as seen previously. Other: None CT CERVICAL SPINE FINDINGS Alignment: No acute subluxation Skull base and vertebrae: No acute fracture. Old-appearing compression of C5 and C6. Osteopenia. Soft tissues and spinal canal: No prevertebral fluid or swelling. No visible canal hematoma. Disc levels: Multilevel degenerative changes with disc space narrowing and endplate irregularity and spurring primarily at C6-C7. Upper chest: Negative. Other: None IMPRESSION: 1. No acute intracranial pathology. Mild age-related atrophy and chronic microvascular ischemic changes. 2. No acute/traumatic cervical spine pathology. Multilevel degenerative changes. Electronically Signed   By: Anner Crete M.D.   On: 04/14/2021 23:28   CT Lumbar Spine Wo Contrast  Result Date: 04/15/2021 CLINICAL DATA:  Lumbar compression fracture EXAM: CT LUMBAR SPINE WITHOUT CONTRAST TECHNIQUE: Multidetector CT imaging of the lumbar spine was performed  without intravenous contrast administration. Multiplanar CT image reconstructions were also generated. COMPARISON:  Radiography from yesterday FINDINGS: Segmentation: 5 lumbar type vertebrae Alignment: Normal Vertebrae: L3-L5 solid arthrodesis and no evident hardware complication. Spinal cord stimulator which enter the canal posteriorly at T12-L1. Paraspinal and other soft tissues: No evidence of hemorrhage or mass about the spine. Expected postoperative scarring. Disc levels: L2-3 adjacent segment ligamentous thickening and facet spurring with moderate spinal stenosis. L5-S1 mild disc narrowing and bulging. IMPRESSION: 1. No acute finding. 2. L3-L5 solid arthrodesis. 3. 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 Electronically Signed   By: Monte Fantasia M.D.   On: 04/15/2021 05:04   DG Hip Unilat W or Wo Pelvis 2-3 Views Right  Result Date: 04/15/2021 CLINICAL DATA:  Right hip pain following fall, initial encounter EXAM: DG HIP (WITH OR WITHOUT PELVIS) 3V RIGHT COMPARISON:  None. FINDINGS: Pelvic ring is intact. Postsurgical changes in the lower lumbar spine are seen. No acute fracture or dislocation is noted. No soft tissue abnormality is seen. IMPRESSION: No acute abnormality noted. Electronically Signed   By: Inez Catalina M.D.   On: 04/15/2021 00:01  EKG: Independently reviewed.  Normal sinus rhythm 86 bpm   Assessment/Plan Generalized weakness with recurrent falls: Patient reportedly has more weak over the last several weeks.  Husband reports that he is needing to feed her which she previously had not been needing to do, and she has had at least 2 falls recently.  No reports of loss of consciousness or trauma to her head.  CT imaging of her head and cervical spine did not show any acute fractures. -Admit to medical telemetry bed -Check vitamin B12 given elevated MCV -PT/OT to eval and treat -Held Zanaflex -Transitions of care consulted  Chest pressure: Acute. Patient presented with  complaints of substernal chest pressure that did not radiate, but reported feeling nauseous and having some shortness of breath.  Initial EKG showed no acute ischemic changes.  High-sensitivity troponin initially 9.  Patient had low risk nuclear stress test and 01/2020. -Follow-up repeat troponin -Continue aspirin and isosorbide mononitrate  Leukocytosis: Acute.  WBC 11.1.  This x-ray was clear and urinalysis showed no acute abnormalities.  Question if secondary to continued diverticulitis given reports of abdominal pain. -Check ESR and CRP  Acute metabolic encephalopathy: Patient noted to be mildly confused and not able to remember date or current president at this time.  CT brain did not note any acute abnormalities.  She is unable to have a MRI of the brain due to neurostimulator in her back -Neurochecks  Abdominal pain recent history of diverticulitis: Patient had initially been seen to have concern for acute diverticulitis in May and June.  Tried on 2 rounds of Augmentin and then prescribed Levaquin and metronidazole. -Follow-up repeat CT scan of the abdomen to pelvis -Plan to start on Zosyn IV if diverticulitis appreciated on CT  Hypothyroidism: Appears controlled with TSH- 0.7 and free T4-9.2 on 02/14/2021.  Previously TSH- 39.8 and free T4 -0.49 on 12/22/2020.  At one point patient had been on 225 mcg of levothyroxine, but had been decreased it seems to 75 mcg(from what patient reported)/112 mcg daily(what was written on the bottle). -Recheck free T4 and TSH -Continue levothyroxine  Macrocytic anemia: Hemoglobin 9.8 g/dL on admission.  Patient baseline appears to be around 9-10, but last hemoglobin was noted to be 12.2 on 6/15, but suspect this is secondary to possible hemoconcentration.  -Will recheck H&H to make sure blood counts are stable -Check vitamin B12 and folate -Continue to monitor  Right knee pain: Patient reportedly complaining of significant right knee pain in the setting  of recent falls and prior history of knee replacements. -Check x-ray of the right knee  Essential hypertension: Blood pressures elevated at 160/90.  Home blood pressure medications include amlodipine 5 mg daily, clonidine 0.1 mg twice daily, metoprolol succinate 50 mg daily, and isosorbide mononitrate 60 mg daily, -Continue home regimen as tolerated  Weight loss poor appetite: Acute.  Patient reportedly has lost 10 to 15 pounds over the last couple of weeks with poor appetite.  Suspect this is related with recent history of diverticulitis. -Follow-up ESR and CRP  Diabetes mellitus type 2, with peripheral neuropathy: Glucose on admission 138.  Last hemoglobin A1c was 8.3 on 01/31/2021 on [facility] and appears to be uncontrolled.  She complains of tingling pain sensations in her hands seems likely secondary to neuropathy.  Home insulin regimen includes glargine 30 units daily, Humalog 3-20 units 3 times daily with meals, Trulicity 1.5 mg every Monday, -Hypoglycemic protocols -Continue gabapentin -CBGs before every meal with moderate SSI -Reduced home Lantus dose  to 20 units daily as patient has been n.p.o. for several hours and initial blood glucose 138 -Adjust insulin regimen as needed  Chronic kidney disease stage IIIb: Creatinine 1.16 which appears around patient's baseline. -Continue to monitor  Chronic back pain joint pains: Status post spinal stimulator.  X-rays and CT imaging did not note any acute fractures.  Patient was noted to have degenerative changes of the lumbar spine. -Tramadol as needed for pain  Hyperlipidemia -Continue atorvastatin  GERD -Continue Protonix  Addendum: Cause of patient's symptoms and presentation is not initially clear.  CT scan of the abdomen and pelvis did not show any signs of diverticulitis, but did note a left ventral hernia containing fat.  Labs reveal repeat hemoglobin 10.9, TSH 0.11, free T4 1.67, ESR 109,  CRP 1.7, vitamin B12 915,  hemoglobin A1c 7.  Patient was noted to have heart rates jump up to 111.  Follow-up [place] [facility].  Husband had reported that her levothyroxine dose had been decreased to 75 mcg daily, but unclear when this was done.  Will need to follow-up with patient's endocrinology in a.m. to determine proper management as it appears she is symptomatic from iatrogenic hyperthyroidism and given patient's age she is at high risk for arrhythmias.  Will check echocardiogram.  Follow-up telemetry overnight.  Due to the severity of patient's weakness and poor intake with weight loss will start her on normal saline IV fluids.  DVT prophylaxis: Lovenox Code Status: Full Family Communication: Husband updated at bedside Disposition Plan: To be determined Consults called: none Admission status: Inpatient, require more than 2 midnight stay given the severity of patient's weakness has now appeared to be secondary to iatrogenic hyperthyroidism need of IV fluid rehydration in the setting of poor p.o. intake and  weight loss.  Norval Morton MD [facility]   If 7PM-7AM, please contact night-coverage   04/15/2021, 7:15 AM

## 2021-04-15 NOTE — ED Notes (Signed)
[name], husband, [phone] would like an update when available

## 2021-04-15 NOTE — Consult Note (Signed)
Orthopedics consulted for this patient.  She has a non-displaced distal femoral periprosthetic fracture. Tentatively posted for Dr. Doreatha Martin for 7/11 for ORIF.  Formal notes to follow.  NPO at midnight.

## 2021-04-15 NOTE — Progress Notes (Signed)
X-rays of the right knee did show concern for acute non displaced fracture of distal femur above prosthetic knee replacement.  Griffin Basil -[facility] consulted recommend keeping patient NPO after midnight and nonweightbearing on Right leg.

## 2021-04-16 ENCOUNTER — Inpatient Hospital Stay (HOSPITAL_COMMUNITY): Payer: Medicare Other | Admitting: Anesthesiology

## 2021-04-16 ENCOUNTER — Encounter (HOSPITAL_COMMUNITY)
Admission: EM | Disposition: A | Discharge status: Home Health | Payer: Self-pay | Source: Home / Self Care | Attending: Internal Medicine

## 2021-04-16 ENCOUNTER — Inpatient Hospital Stay (HOSPITAL_COMMUNITY): Payer: Medicare Other

## 2021-04-16 DIAGNOSIS — R531 Weakness: Secondary | ICD-10-CM

## 2021-04-16 HISTORY — PX: ORIF FEMUR FRACTURE: SHX2119

## 2021-04-16 LAB — BASIC METABOLIC PANEL
Anion gap: 11 (ref 5–15)
BUN: 12 mg/dL (ref 8–23)
CO2: 21 mmol/L — ABNORMAL LOW (ref 22–32)
Calcium: 9.9 mg/dL (ref 8.9–10.3)
Chloride: 105 mmol/L (ref 98–111)
Creatinine, Ser: 1.07 mg/dL — ABNORMAL HIGH (ref 0.44–1.00)
GFR, Estimated: 56 mL/min — ABNORMAL LOW (ref >60)
Glucose, Bld: 153 mg/dL — ABNORMAL HIGH (ref 70–99)
Potassium: 3.6 mmol/L (ref 3.5–5.1)
Sodium: 137 mmol/L (ref 135–145)

## 2021-04-16 LAB — CBC
HCT: 32 % — ABNORMAL LOW (ref 36.0–46.0)
Hemoglobin: 10.1 g/dL — ABNORMAL LOW (ref 12.0–15.0)
MCH: 31.7 pg (ref 26.0–34.0)
MCHC: 31.6 g/dL (ref 30.0–36.0)
MCV: 100.3 fL — ABNORMAL HIGH (ref 80.0–100.0)
Platelets: 373 10*3/uL (ref 150–400)
RBC: 3.19 MIL/uL — ABNORMAL LOW (ref 3.87–5.11)
RDW: 13.6 % (ref 11.5–15.5)
WBC: 11 10*3/uL — ABNORMAL HIGH (ref 4.0–10.5)
nRBC: 0 % (ref 0.0–0.2)

## 2021-04-16 LAB — URINE CULTURE: Culture: <10000 — AB

## 2021-04-16 LAB — GLUCOSE, CAPILLARY
Glucose-Capillary: 142 mg/dL — ABNORMAL HIGH (ref 70–99)
Glucose-Capillary: 143 mg/dL — ABNORMAL HIGH (ref 70–99)
Glucose-Capillary: 122 mg/dL — ABNORMAL HIGH (ref 70–99)
Glucose-Capillary: 155 mg/dL — ABNORMAL HIGH (ref 70–99)
Glucose-Capillary: 159 mg/dL — ABNORMAL HIGH (ref 70–99)

## 2021-04-16 LAB — MRSA NEXT GEN BY PCR, NASAL: MRSA by PCR Next Gen: NOT DETECTED

## 2021-04-16 SURGERY — OPEN REDUCTION INTERNAL FIXATION (ORIF) DISTAL FEMUR FRACTURE
Anesthesia: General | Site: Leg Upper | Laterality: Right

## 2021-04-16 MED ORDER — ROCURONIUM BROMIDE 100 MG/10ML IV SOLN
INTRAVENOUS | Status: DC | PRN
  Administered 2021-04-16: 50 mg via INTRAVENOUS

## 2021-04-16 MED ORDER — LIDOCAINE 2% (20 MG/ML) 5 ML SYRINGE
INTRAMUSCULAR | Status: AC
  Filled 2021-04-16: qty 5

## 2021-04-16 MED ORDER — PROPOFOL 10 MG/ML IV BOLUS
INTRAVENOUS | Status: AC
  Filled 2021-04-16: qty 20

## 2021-04-16 MED ORDER — POLYETHYLENE GLYCOL 3350 17 G PO PACK
17.0000 g | PACK | Freq: Every day | ORAL | Status: DC | PRN
  Administered 2021-04-20: 17 g via ORAL
  Filled 2021-04-16: qty 1

## 2021-04-16 MED ORDER — LIDOCAINE HCL (CARDIAC) PF 100 MG/5ML IV SOSY
PREFILLED_SYRINGE | INTRAVENOUS | Status: DC | PRN
  Administered 2021-04-16: 80 mg via INTRAVENOUS

## 2021-04-16 MED ORDER — FENTANYL CITRATE (PF) 250 MCG/5ML IJ SOLN
INTRAMUSCULAR | Status: AC
  Filled 2021-04-16: qty 5

## 2021-04-16 MED ORDER — PHENYLEPHRINE HCL-NACL 10-0.9 MG/250ML-% IV SOLN
INTRAVENOUS | Status: DC | PRN
  Administered 2021-04-16: 60 ug/min via INTRAVENOUS
  Administered 2021-04-16: 50 ug/min via INTRAVENOUS

## 2021-04-16 MED ORDER — FENTANYL CITRATE (PF) 100 MCG/2ML IJ SOLN
25.0000 ug | INTRAMUSCULAR | Status: DC | PRN
  Administered 2021-04-16: 50 ug via INTRAVENOUS
  Administered 2021-04-16: 25 ug via INTRAVENOUS

## 2021-04-16 MED ORDER — BUPIVACAINE-EPINEPHRINE 0.25% -1:200000 IJ SOLN
INTRAMUSCULAR | Status: DC | PRN
  Administered 2021-04-16: 20 mL

## 2021-04-16 MED ORDER — PROPOFOL 10 MG/ML IV BOLUS
INTRAVENOUS | Status: DC | PRN
  Administered 2021-04-16: 80 mg via INTRAVENOUS

## 2021-04-16 MED ORDER — CLINDAMYCIN PHOSPHATE 900 MG/50ML IV SOLN
900.0000 mg | Freq: Three times a day (TID) | INTRAVENOUS | Status: AC
  Administered 2021-04-16 – 2021-04-17 (×3): 900 mg via INTRAVENOUS
  Filled 2021-04-16 (×3): qty 50

## 2021-04-16 MED ORDER — METOCLOPRAMIDE HCL 5 MG/ML IJ SOLN
5.0000 mg | Freq: Three times a day (TID) | INTRAMUSCULAR | Status: DC | PRN
Start: 2021-04-16 — End: 2021-04-17

## 2021-04-16 MED ORDER — MIDAZOLAM HCL 2 MG/2ML IJ SOLN
INTRAMUSCULAR | Status: AC
  Filled 2021-04-16: qty 2

## 2021-04-16 MED ORDER — BUPIVACAINE-EPINEPHRINE (PF) 0.25% -1:200000 IJ SOLN
INTRAMUSCULAR | Status: AC
  Filled 2021-04-16: qty 30

## 2021-04-16 MED ORDER — MORPHINE SULFATE (PF) 2 MG/ML IV SOLN
2.0000 mg | INTRAVENOUS | Status: DC | PRN
  Administered 2021-04-16 – 2021-04-17 (×3): 2 mg via INTRAVENOUS
  Filled 2021-04-16 (×5): qty 1

## 2021-04-16 MED ORDER — ENOXAPARIN SODIUM 40 MG/0.4ML IJ SOSY
40.0000 mg | PREFILLED_SYRINGE | INTRAMUSCULAR | Status: DC
  Administered 2021-04-17 – 2021-04-24 (×8): 40 mg via SUBCUTANEOUS
  Filled 2021-04-16 (×8): qty 0.4

## 2021-04-16 MED ORDER — VANCOMYCIN HCL 1000 MG IV SOLR
INTRAVENOUS | Status: AC
  Filled 2021-04-16: qty 1000

## 2021-04-16 MED ORDER — LACTATED RINGERS IV SOLN: INTRAVENOUS | Status: DC

## 2021-04-16 MED ORDER — CHLORHEXIDINE GLUCONATE 0.12 % MT SOLN
OROMUCOSAL | Status: AC
  Administered 2021-04-16: 15 mL via OROMUCOSAL
  Filled 2021-04-16: qty 15

## 2021-04-16 MED ORDER — CHLORHEXIDINE GLUCONATE 0.12 % MT SOLN
OROMUCOSAL | Status: AC
  Filled 2021-04-16: qty 15

## 2021-04-16 MED ORDER — PHENYLEPHRINE 40 MCG/ML (10ML) SYRINGE FOR IV PUSH (FOR BLOOD PRESSURE SUPPORT)
PREFILLED_SYRINGE | INTRAVENOUS | Status: AC
  Filled 2021-04-16: qty 10

## 2021-04-16 MED ORDER — ROCURONIUM BROMIDE 10 MG/ML (PF) SYRINGE
PREFILLED_SYRINGE | INTRAVENOUS | Status: AC
  Filled 2021-04-16: qty 10

## 2021-04-16 MED ORDER — SUCCINYLCHOLINE CHLORIDE 200 MG/10ML IV SOSY
PREFILLED_SYRINGE | INTRAVENOUS | Status: DC | PRN
  Administered 2021-04-16: 160 mg via INTRAVENOUS

## 2021-04-16 MED ORDER — TRAMADOL HCL 50 MG PO TABS
50.0000 mg | ORAL_TABLET | Freq: Four times a day (QID) | ORAL | Status: DC | PRN
  Administered 2021-04-16 – 2021-04-17 (×3): 100 mg via ORAL
  Administered 2021-04-17: 50 mg via ORAL
  Administered 2021-04-17 – 2021-04-19 (×3): 100 mg via ORAL
  Filled 2021-04-16 (×6): qty 2

## 2021-04-16 MED ORDER — METHOCARBAMOL 1000 MG/10ML IJ SOLN
500.0000 mg | Freq: Four times a day (QID) | INTRAVENOUS | Status: DC | PRN
  Filled 2021-04-16: qty 5

## 2021-04-16 MED ORDER — VANCOMYCIN HCL 1000 MG IV SOLR
INTRAVENOUS | Status: DC | PRN
  Administered 2021-04-16: 1000 mg

## 2021-04-16 MED ORDER — PHENYLEPHRINE HCL (PRESSORS) 10 MG/ML IV SOLN
INTRAVENOUS | Status: DC | PRN
  Administered 2021-04-16 (×2): 100 ug via INTRAVENOUS
  Administered 2021-04-16: 200 ug via INTRAVENOUS

## 2021-04-16 MED ORDER — FENTANYL CITRATE (PF) 100 MCG/2ML IJ SOLN
INTRAMUSCULAR | Status: DC | PRN
  Administered 2021-04-16 (×3): 50 ug via INTRAVENOUS
  Administered 2021-04-16: 100 ug via INTRAVENOUS

## 2021-04-16 MED ORDER — ONDANSETRON HCL 4 MG/2ML IJ SOLN: 4.0000 mg | Freq: Once | INTRAMUSCULAR | Status: DC | PRN

## 2021-04-16 MED ORDER — ONDANSETRON HCL 4 MG PO TABS: 4.0000 mg | ORAL_TABLET | Freq: Four times a day (QID) | ORAL | Status: DC | PRN

## 2021-04-16 MED ORDER — ONDANSETRON HCL 4 MG/2ML IJ SOLN
INTRAMUSCULAR | Status: AC
  Filled 2021-04-16: qty 2

## 2021-04-16 MED ORDER — CHLORHEXIDINE GLUCONATE CLOTH 2 % EX PADS
6.0000 | MEDICATED_PAD | Freq: Every day | CUTANEOUS | Status: DC
  Administered 2021-04-16 – 2021-04-24 (×6): 6 via TOPICAL

## 2021-04-16 MED ORDER — DOCUSATE SODIUM 100 MG PO CAPS
100.0000 mg | ORAL_CAPSULE | Freq: Two times a day (BID) | ORAL | Status: DC
  Administered 2021-04-16 – 2021-04-24 (×17): 100 mg via ORAL
  Filled 2021-04-16 (×17): qty 1

## 2021-04-16 MED ORDER — ORAL CARE MOUTH RINSE
15.0000 mL | Freq: Once | OROMUCOSAL | Status: AC
Start: 2021-04-16 — End: 2021-04-16

## 2021-04-16 MED ORDER — METOCLOPRAMIDE HCL 5 MG PO TABS: 5.0000 mg | ORAL_TABLET | Freq: Three times a day (TID) | ORAL | Status: DC | PRN

## 2021-04-16 MED ORDER — ACETAMINOPHEN 500 MG PO TABS
1000.0000 mg | ORAL_TABLET | Freq: Once | ORAL | Status: AC
  Administered 2021-04-16: 1000 mg via ORAL
  Filled 2021-04-16: qty 2

## 2021-04-16 MED ORDER — TRAMADOL HCL 50 MG PO TABS
ORAL_TABLET | ORAL | Status: AC
  Filled 2021-04-16: qty 2

## 2021-04-16 MED ORDER — SUCCINYLCHOLINE CHLORIDE 200 MG/10ML IV SOSY
PREFILLED_SYRINGE | INTRAVENOUS | Status: AC
  Filled 2021-04-16: qty 10

## 2021-04-16 MED ORDER — 0.9 % SODIUM CHLORIDE (POUR BTL) OPTIME
TOPICAL | Status: DC | PRN
  Administered 2021-04-16: 1000 mL

## 2021-04-16 MED ORDER — METHOCARBAMOL 500 MG PO TABS
ORAL_TABLET | ORAL | Status: AC
  Filled 2021-04-16: qty 1

## 2021-04-16 MED ORDER — CLINDAMYCIN PHOSPHATE 900 MG/50ML IV SOLN
INTRAVENOUS | Status: AC
  Filled 2021-04-16: qty 50

## 2021-04-16 MED ORDER — METHOCARBAMOL 500 MG PO TABS
500.0000 mg | ORAL_TABLET | Freq: Four times a day (QID) | ORAL | Status: DC | PRN
  Administered 2021-04-16: 500 mg via ORAL

## 2021-04-16 MED ORDER — ONDANSETRON HCL 4 MG/2ML IJ SOLN
4.0000 mg | Freq: Four times a day (QID) | INTRAMUSCULAR | Status: DC | PRN
  Administered 2021-04-16 – 2021-04-18 (×2): 4 mg via INTRAVENOUS
  Filled 2021-04-16: qty 2

## 2021-04-16 MED ORDER — MIDAZOLAM HCL 5 MG/5ML IJ SOLN
INTRAMUSCULAR | Status: DC | PRN
  Administered 2021-04-16 (×2): 1 mg via INTRAVENOUS

## 2021-04-16 MED ORDER — FENTANYL CITRATE (PF) 100 MCG/2ML IJ SOLN
INTRAMUSCULAR | Status: AC
  Administered 2021-04-16: 25 ug via INTRAVENOUS
  Filled 2021-04-16: qty 2

## 2021-04-16 MED ORDER — CLINDAMYCIN PHOSPHATE 900 MG/50ML IV SOLN
900.0000 mg | Freq: Once | INTRAVENOUS | Status: AC
  Administered 2021-04-16: 900 mg via INTRAVENOUS

## 2021-04-16 MED ORDER — GABAPENTIN 100 MG PO CAPS
200.0000 mg | ORAL_CAPSULE | Freq: Two times a day (BID) | ORAL | Status: DC
  Administered 2021-04-17: 200 mg via ORAL
  Filled 2021-04-16: qty 2

## 2021-04-16 MED ORDER — CHLORHEXIDINE GLUCONATE 0.12 % MT SOLN: 15.0000 mL | Freq: Once | OROMUCOSAL | Status: AC

## 2021-04-16 SURGICAL SUPPLY — 62 items
BAG COUNTER SPONGE SURGICOUNT (BAG) ×2 IMPLANT
BAG SURGICOUNT SPONGE COUNTING (BAG) ×1
BIT DRILL 4.3 (BIT) ×2
BIT DRILL 4.3MM (BIT) ×1
BIT DRILL 4.3X300MM (BIT) ×1 IMPLANT
BIT DRILL LONG 3.3 (BIT) ×2 IMPLANT
BIT DRILL LONG 3.3MM (BIT) ×1
BIT DRILL QC 3.3X195 (BIT) ×3 IMPLANT
BNDG COHESIVE 6X5 TAN STRL LF (GAUZE/BANDAGES/DRESSINGS) ×3 IMPLANT
BNDG ELASTIC 6X10 VLCR STRL LF (GAUZE/BANDAGES/DRESSINGS) ×6 IMPLANT
BRUSH SCRUB EZ PLAIN DRY (MISCELLANEOUS) ×6 IMPLANT
CANISTER SUCT 3000ML PPV (MISCELLANEOUS) ×3 IMPLANT
CAP LOCK NCB (Cap) ×21 IMPLANT
CHLORAPREP W/TINT 26 (MISCELLANEOUS) ×3 IMPLANT
COVER SURGICAL LIGHT HANDLE (MISCELLANEOUS) ×3 IMPLANT
DRAPE C-ARM 42X72 X-RAY (DRAPES) ×3 IMPLANT
DRAPE C-ARMOR (DRAPES) ×3 IMPLANT
DRAPE HALF SHEET 40X57 (DRAPES) ×6 IMPLANT
DRAPE ORTHO SPLIT 77X108 STRL (DRAPES) ×6
DRAPE SURG 17X23 STRL (DRAPES) ×3 IMPLANT
DRAPE SURG ORHT 6 SPLT 77X108 (DRAPES) ×2 IMPLANT
DRAPE U-SHAPE 47X51 STRL (DRAPES) ×3 IMPLANT
DRSG MEPILEX BORDER 4X8 (GAUZE/BANDAGES/DRESSINGS) ×6 IMPLANT
ELECT REM PT RETURN 9FT ADLT (ELECTROSURGICAL) ×3
ELECTRODE REM PT RTRN 9FT ADLT (ELECTROSURGICAL) ×1 IMPLANT
GLOVE SURG ENC MOIS LTX SZ6.5 (GLOVE) ×9 IMPLANT
GLOVE SURG ENC MOIS LTX SZ7.5 (GLOVE) ×12 IMPLANT
GLOVE SURG UNDER POLY LF SZ6.5 (GLOVE) ×3 IMPLANT
GLOVE SURG UNDER POLY LF SZ7.5 (GLOVE) ×3 IMPLANT
GOWN STRL REUS W/ TWL LRG LVL3 (GOWN DISPOSABLE) ×3 IMPLANT
GOWN STRL REUS W/TWL LRG LVL3 (GOWN DISPOSABLE) ×9
K-WIRE 2.0 (WIRE) ×3
K-WIRE FXSTD 280X2XNS SS (WIRE) ×1
KIT BASIN OR (CUSTOM PROCEDURE TRAY) ×3 IMPLANT
KIT TURNOVER KIT B (KITS) ×3 IMPLANT
KWIRE FXSTD 280X2XNS SS (WIRE) ×1 IMPLANT
NS IRRIG 1000ML POUR BTL (IV SOLUTION) ×3 IMPLANT
PACK TOTAL JOINT (CUSTOM PROCEDURE TRAY) ×3 IMPLANT
PAD ARMBOARD 7.5X6 YLW CONV (MISCELLANEOUS) ×6 IMPLANT
PAD CAST 4YDX4 CTTN HI CHSV (CAST SUPPLIES) ×1 IMPLANT
PADDING CAST COTTON 4X4 STRL (CAST SUPPLIES) ×3
PADDING CAST COTTON 6X4 STRL (CAST SUPPLIES) ×3 IMPLANT
PLATE FEM DIST NCB PP 278MM (Plate) ×3 IMPLANT
SCREW 5.0 80MM (Screw) ×12 IMPLANT
SCREW NCB 3.5X75X5X6.2XST (Screw) ×1 IMPLANT
SCREW NCB 4.0MX38M (Screw) ×3 IMPLANT
SCREW NCB 5.0X38 (Screw) ×6 IMPLANT
SCREW NCB 5.0X75MM (Screw) ×3 IMPLANT
SPONGE T-LAP 18X18 ~~LOC~~+RFID (SPONGE) ×6 IMPLANT
STAPLER VISISTAT 35W (STAPLE) ×3 IMPLANT
SUCTION FRAZIER HANDLE 10FR (MISCELLANEOUS) ×3
SUCTION TUBE FRAZIER 10FR DISP (MISCELLANEOUS) ×1 IMPLANT
SUT ETHILON 3 0 PS 1 (SUTURE) ×6 IMPLANT
SUT MNCRL AB 3-0 PS2 27 (SUTURE) ×3 IMPLANT
SUT VIC AB 0 CT1 27 (SUTURE)
SUT VIC AB 0 CT1 27XBRD ANBCTR (SUTURE) IMPLANT
SUT VIC AB 1 CT1 27 (SUTURE)
SUT VIC AB 1 CT1 27XBRD ANBCTR (SUTURE) IMPLANT
SUT VIC AB 2-0 CT1 27 (SUTURE) ×6
SUT VIC AB 2-0 CT1 TAPERPNT 27 (SUTURE) ×2 IMPLANT
TOWEL GREEN STERILE (TOWEL DISPOSABLE) ×6 IMPLANT
WATER STERILE IRR 1000ML POUR (IV SOLUTION) ×6 IMPLANT

## 2021-04-16 NOTE — Progress Notes (Signed)
OT Cancellation Note  Patient Details Name: [name] MRN: [id number] DOB: [birth year]   Cancelled Treatment:    Reason Eval/Treat Not Completed: Patient at procedure or test/ unavailable (Pt at surgery. Will return as schedule allows.)  [place], OTR/L Acute Rehab Pager: [phone] Office: [phone] 04/16/2021, 11:37 AM

## 2021-04-16 NOTE — Progress Notes (Signed)
PT Cancellation Note  Patient Details Name: [name] MRN: [id number] DOB: Nov 03, 1950   Cancelled Treatment:    Reason Eval/Treat Not Completed: Patient at procedure or test/unavailable;Other (comment) (Pt to OR for right knee surgery. Will return tomorrow. MD: please update orders and reorder PT as appropriate.)   Alvira Philips 04/16/2021, 11:56 AM Deniz Eskridge M,PT Acute Rehab Services [phone] (pager)

## 2021-04-16 NOTE — Progress Notes (Signed)
PROGRESS NOTE    [name]  [id number] DOB: 04-20-1951 DOA: 04/14/2021 PCP: Biagio Borg, MD   Chief Complain: Chest pressure  Brief Narrative:  Patient is a 70 year old female with history of hypertension, hyperlipidemia, coronary artery disease, diabetes type 2, hypothyroidism, cervical radiculopathy, chronic back pain, anxiety, depression, GERD who presented with chest pressure from home.  Recently, she was found to be weak, falling frequently, confused, fatigued, had weight loss.  On presentation she was hemodynamically stable.  CT head and CT cervical spine did not show any acute abnormalities.  Chest x-ray did not show any pneumonia.  Lab work showed mild leukocytosis.  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  Orthopedics consulted and planning for ORIF.  Assessment & Plan:   Principal Problem:   Weakness Active Problems:   HLD (hyperlipidemia)   Macrocytic anemia   Hypothyroidism   Left lower quadrant abdominal pain   CKD (chronic kidney disease), stage III (HCC)   Leg pain, lateral, right   Acute metabolic encephalopathy   Recurrent falls   Type 2 diabetes mellitus (HCC)   Weight loss   Leukocytosis   Iatrogenic thyrotoxicosis   Generalized weakness/recurrent falls/right distal femur fracture: Lives with husband at home.  Found to be weak, needing assistance with ambulation, confusion.  CT head/cervical spine did not show any acute fractures.  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  Orthopedics consulted and planning for ORIF. Continue pain management, supportive care.  PT/OT evaluation after surgery.  Chest pressure: EKG did not show any ischemic changes.  Troponins normal.  She had a nuclear stress test on 4/21 which was low all lower risk study.  Continue aspirin, isosorbide mononitrate.  Leukocytosis: Mild.  X-ray  did not show any pneumonia.  Urinalysis was not suggestive of UTI.  Continue monitoring.  Acute metabolic encephalopathy: Recently found to be confused.  CT head did not show any acute abnormalities.  Cannot have MRI of the brain due to neurostimulator in her back.  Continue to monitor mental status.  We will check ammonia, vitamin B1.Normal Vitamin 123456 ,folic acid  Abdominal pain/recent  history of diverticulitis: She was recently treated for acute diverticulitis twice, in May and June.  Abdomen/pelvis CT didn't show any evidence of bowel wall thickening, distention, or inflammatory changes. Showed colonic diverticulosis without evidence of acute diverticulitis.  History of hypothyroidism/now hyperthyroid: Low TSH, elevated free T4.  Takes levothyroxine at home which will be held for now.  Patient will be advised to repeat TFT in 4 weeks and follow-up with her endocrinologist.  Macrocytic anemia: Hemoglobin currently stable in in the range of 9-10.  Normal vitamin 123456, folic acid level.  Hypertension: Continue home regimen.  On metoprolol, amlodipine, clonidine, isosorbide mononitrate  History of weight loss/poor appetite: We will consult dietitian  History of diabetes type 2/peripheral neuropathy: HbA1c of 7.Has history of diabetic neuropathy.  On Lantus, Humalog, Trulicity at home.  Continue to monitor blood sugars.  Continue current insulin regimen.  Continue gabapentin for the neuropathy with decreased dose due to encephalopathy  CKD stage IIIb: Baseline creatinine 1.6, currently kidney function at baseline.  Hyperlipidemia: On Lipitor  GERD: On Protonix  Chronic pain syndrome: Continue supportive care, pain management             DVT prophylaxis:Lovenox Code Status:Full  Family Communication: Called husband on phone,call not received Status  is: Inpatient  Remains inpatient appropriate because:Inpatient level of care appropriate due to severity of illness  Dispo: The  patient is from: Home              Anticipated d/c is to: SNF              Patient currently is not medically stable to d/c.   Difficult to place patient No     Consultants: Orthopedics  Procedures:Plan for ORIF  Antimicrobials:  Anti-infectives (From admission, onward)    None       Subjective:  Patient seen and examined the bedside this morning.  Hemodynamically stable.  Apparently looks comfortable complains of pain on the left side of the abdomen.  Alert and awake but confused.  Denies any pain in the right thigh and not sure if she had a fracture there   Objective: Vitals:   04/15/21 1720 04/15/21 1933 04/16/21 0414 04/16/21 0752  BP: (!) 151/94 (!) 187/92 (!) 176/73 (!) 159/76  Pulse:  96 91 97  Resp: '16 18 20 19  '$ Temp: 98 F (36.7 C) 98.4 F (36.9 C) 98 F (36.7 C) 98.2 F (36.8 C)  TempSrc: Oral     SpO2: 100% 100%  100%  Weight: 111.6 kg     Height: '5\' 5"'$  (1.651 m)       Intake/Output Summary (Last 24 hours) at 04/16/2021 0800 Last data filed at 04/15/2021 2300 Gross per 24 hour  Intake 20.43 ml  Output 600 ml  Net -579.57 ml   Filed Weights   04/15/21 0721 04/15/21 1720  Weight: 117.9 kg 111.6 kg    Examination:  General exam: Appears calm and comfortable ,Not in distress,obese HEENT:PERRL,Oral mucosa moist, Ear/Nose normal on gross exam Respiratory system: Bilateral equal air entry, normal vesicular breath sounds, no wheezes or crackles  Cardiovascular system: S1 & S2 heard, RRR. No JVD, murmurs, rubs, gallops or clicks. No pedal edema. Gastrointestinal system: Abdomen is nondistended, soft and nontender. No organomegaly or masses felt. Normal bowel sounds heard. Central nervous system: Alert and awake but not oriented. No focal neurological deficits. Extremities: No edema, no clubbing ,no cyanosis, distal peripheral pulses palpable. Skin: No rashes, lesions or ulcers,no icterus ,no pallor   Data Reviewed: I have personally reviewed following  labs and imaging studies  CBC: Recent Labs  Lab 04/15/21 0235 04/15/21 0733 04/16/21 0242  WBC 11.1*  --  11.0*  NEUTROABS 7.5  --   --   HGB 9.8* 10.9* 10.1*  HCT 30.2* 33.3* 32.0*  MCV 101.3*  --  100.3*  PLT 346  --  [id number]   Basic Metabolic Panel: Recent Labs  Lab 04/15/21 0235 04/16/21 0242  NA 139 137  K 3.7 3.6  CL 109 105  CO2 22 21*  GLUCOSE 138* 153*  BUN 15 12  CREATININE 1.16* 1.07*  CALCIUM 9.6 9.9   GFR: Estimated Creatinine Clearance: 60 mL/min (A) (by C-G formula based on SCr of 1.07 mg/dL (H)). Liver Function Tests: Recent Labs  Lab 04/15/21 0235  AST 14*  ALT 13  ALKPHOS 84  BILITOT 1.2  PROT 7.0  ALBUMIN 3.1*   No results for input(s): LIPASE, AMYLASE in the last 168 hours. No results for input(s): AMMONIA in the last 168 hours. Coagulation Profile: No results for input(s): INR, PROTIME in the last 168 hours. Cardiac Enzymes: No results for input(s): CKTOTAL, CKMB, CKMBINDEX, TROPONINI in the last 168 hours. BNP (last 3 results) No results for input(s): PROBNP in  the last 8760 hours. HbA1C: Recent Labs    04/15/21 0856  HGBA1C 7.0*   CBG: Recent Labs  Lab 04/15/21 0919 04/15/21 1207 04/15/21 1725 04/15/21 1937  GLUCAP 139* 121* 118* 120*   Lipid Profile: No results for input(s): CHOL, HDL, LDLCALC, TRIG, CHOLHDL, LDLDIRECT in the last 72 hours. Thyroid Function Tests: Recent Labs    04/15/21 0722  TSH 0.110*  FREET4 1.67*   Anemia Panel: Recent Labs    04/15/21 0733 04/15/21 0910  VITAMINB12 915*  --   FOLATE  --  15.3   Sepsis Labs: No results for input(s): PROCALCITON, LATICACIDVEN in the last 168 hours.  Recent Results (from the past 240 hour(s))  Resp Panel by RT-PCR (Flu A&B, Covid) Nasopharyngeal Swab     Status: None   Collection Time: 04/14/21 12:51 AM   Specimen: Nasopharyngeal Swab; Nasopharyngeal(NP) swabs in vial transport medium  Result Value Ref Range Status   SARS Coronavirus 2 by RT PCR NEGATIVE  NEGATIVE Final    Comment: (NOTE) SARS-CoV-2 target nucleic acids are NOT DETECTED.  The SARS-CoV-2 RNA is generally detectable in upper respiratory specimens during the acute phase of infection. The lowest concentration of SARS-CoV-2 viral copies this assay can detect is 138 copies/mL. A negative result does not preclude SARS-Cov-2 infection and should not be used as the sole basis for treatment or other patient management decisions. A negative result may occur with  improper specimen collection/handling, submission of specimen other than nasopharyngeal swab, presence of viral mutation(s) within the areas targeted by this assay, and inadequate number of viral copies(<138 copies/mL). A negative result must be combined with clinical observations, patient history, and epidemiological information. The expected result is Negative.  Fact Sheet for Patients:  EntrepreneurPulse.com.au  Fact Sheet for Healthcare Providers:  IncredibleEmployment.be  This test is no t yet approved or cleared by the [place] FDA and  has been authorized for detection and/or diagnosis of SARS-CoV-2 by FDA under an Emergency Use Authorization (EUA). This EUA will remain  in effect (meaning this test can be used) for the duration of the COVID-19 declaration under Section 564(b)(1) of the Act, 21 U.S.C.section 360bbb-3(b)(1), unless the authorization is terminated  or revoked sooner.       Influenza A by PCR NEGATIVE NEGATIVE Final   Influenza B by PCR NEGATIVE NEGATIVE Final    Comment: (NOTE) The Xpert Xpress SARS-CoV-2/FLU/RSV plus assay is intended as an aid in the diagnosis of influenza from Nasopharyngeal swab specimens and should not be used as a sole basis for treatment. Nasal washings and aspirates are unacceptable for Xpert Xpress SARS-CoV-2/FLU/RSV testing.  Fact Sheet for Patients: EntrepreneurPulse.com.au  Fact Sheet for Healthcare  Providers: IncredibleEmployment.be  This test is not yet approved or cleared by the [place] FDA and has been authorized for detection and/or diagnosis of SARS-CoV-2 by FDA under an Emergency Use Authorization (EUA). This EUA will remain in effect (meaning this test can be used) for the duration of the COVID-19 declaration under Section 564(b)(1) of the Act, 21 U.S.C. section 360bbb-3(b)(1), unless the authorization is terminated or revoked.  Performed at [facility], [place] [address] [place], [place] [address]          Radiology Studies: CT ABDOMEN PELVIS WO CONTRAST  Result Date: 04/15/2021 CLINICAL DATA:  70 year old female with acute abdominal and pelvic pain. EXAM: CT ABDOMEN AND PELVIS WITHOUT CONTRAST TECHNIQUE: Multidetector CT imaging of the abdomen and pelvis was performed following the standard protocol without IV contrast.  COMPARISON:  03/21/2021 and prior CTs FINDINGS: Please note that parenchymal abnormalities may be missed without intravenous contrast. Lower chest: No acute abnormality Hepatobiliary: The liver is unremarkable. The patient is status post cholecystectomy. No biliary dilatation. Pancreas: Unremarkable Spleen: Unremarkable Adrenals/Urinary Tract: The kidneys, adrenal glands and bladder are unremarkable. Stable pelvic calcifications are noted. Stomach/Bowel: Gastric surgical changes are identified. Appendix appears normal. No evidence of bowel wall thickening, distention, or inflammatory changes. Colonic diverticulosis without evidence of acute diverticulitis noted. Vascular/Lymphatic: Aortic atherosclerosis. No enlarged abdominal or pelvic lymph nodes. Reproductive: Status post hysterectomy. No adnexal masses. Other: No ascites, focal collection or pneumoperitoneum. LEFT LATERAL abdominal wall hernia and LEFT inguinal hernia containing fat are unchanged. Musculoskeletal: No acute or suspicious bony abnormalities are identified.  Posterior/interbody fusion changes from L3-L5 again noted. Neurostimulator in the LOWER thoracic region noted. IMPRESSION: 1. No evidence of acute abnormality. 2. Unchanged LEFT LATERAL abdominal wall hernia and LEFT inguinal hernia containing fat. 3. Aortic Atherosclerosis (ICD10-I70.0). Electronically Signed   By: Margarette Canada M.D.   On: 04/15/2021 12:39   DG Chest 2 View  Result Date: 04/14/2021 CLINICAL DATA:  Chest pain for 1 hour EXAM: CHEST - 2 VIEW COMPARISON:  11/09/2020 FINDINGS: The heart size and mediastinal contours are within normal limits. Both lungs are clear. The visualized skeletal structures are unremarkable. Spinal stimulator is noted in place. IMPRESSION: No active cardiopulmonary disease. Electronically Signed   By: Inez Catalina M.D.   On: 04/14/2021 22:23   DG Thoracic Spine 2 View  Result Date: 04/15/2021 CLINICAL DATA:  Recent falls with back pain, initial encounter EXAM: THORACIC SPINE 2 VIEWS COMPARISON:  None FINDINGS: Spinal stimulator is noted. Vertebral body height is well maintained. No pedicle abnormality or paraspinal mass is noted. No definitive rib abnormality is seen. IMPRESSION: No acute abnormality noted. Electronically Signed   By: Inez Catalina M.D.   On: 04/15/2021 00:01   DG Lumbar Spine Complete  Result Date: 04/14/2021 CLINICAL DATA:  Frequent falls with low back pain, initial encounter EXAM: LUMBAR SPINE - COMPLETE 4+ VIEW COMPARISON:  04/05/2018 FINDINGS: Five lumbar type vertebral bodies are well visualized. Vertebral body height is well maintained. Changes of prior interbody fusion are noted at L3-4. L3-L5 fixation is noted pedicle screws and posterior fixation elements. Left lateral fixation of L3-4 is noted. Spinal stimulator is seen. No pars defects are noted. No compression deformity is noted. No soft tissue abnormality is seen. IMPRESSION: Postsurgical changes stable in appearance from the prior exam. No acute abnormality noted. Electronically Signed    By: Inez Catalina M.D.   On: 04/14/2021 23:59   DG Knee 1-2 Views Right  Result Date: 04/15/2021 CLINICAL DATA:  Bilateral leg weakness for the past 2 weeks. No known injury. EXAM: RIGHT KNEE - 1-2 VIEW COMPARISON:  Plain film of the RIGHT knee dated 02/03/2016. FINDINGS: Lucency within the distal RIGHT femur diaphysis, most suggestive of acute nondisplaced fracture line. Overall osseous alignment is normal. RIGHT knee arthroplasty hardware appears intact and appropriately positioned. IMPRESSION: 1. Acute-appearing nondisplaced fracture line within the distal RIGHT femur diaphysis. 2. RIGHT knee arthroplasty hardware appears intact and appropriately positioned. Electronically Signed   By: Franki Cabot M.D.   On: 04/15/2021 09:21   DG Ankle Complete Right  Result Date: 04/15/2021 CLINICAL DATA:  Recent falls with right ankle pain, initial encounter EXAM: RIGHT ANKLE - COMPLETE 3+ VIEW COMPARISON:  None. FINDINGS: Mild tarsal degenerative changes are seen. Tibiotalar degenerative changes noted. No acute fracture or  dislocation is seen. No soft tissue abnormality is noted. IMPRESSION: Tarsal degenerative changes without acute bony abnormality. Electronically Signed   By: Inez Catalina M.D.   On: 04/15/2021 00:00   CT Head Wo Contrast  Result Date: 04/14/2021 CLINICAL DATA:  70 year old female with neck pain. EXAM: CT HEAD WITHOUT CONTRAST CT CERVICAL SPINE WITHOUT CONTRAST TECHNIQUE: Multidetector CT imaging of the head and cervical spine was performed following the standard protocol without intravenous contrast. Multiplanar CT image reconstructions of the cervical spine were also generated. COMPARISON:  Head CT dated 11/09/2020. FINDINGS: CT HEAD FINDINGS Brain: Mild age-related atrophy and chronic microvascular ischemic changes. There is no acute intracranial hemorrhage. No mass effect or midline shift no extra-axial fluid collection. Vascular: No hyperdense vessel or unexpected calcification. Skull:  Normal. Negative for fracture or focal lesion. Sinuses/Orbits: The visualized paranasal sinuses and mastoid air cells are clear. Postsurgical changes of left maxillary sinus. Retained tooth in the left maxillary sinus as seen previously. Other: None CT CERVICAL SPINE FINDINGS Alignment: No acute subluxation Skull base and vertebrae: No acute fracture. Old-appearing compression of C5 and C6. Osteopenia. Soft tissues and spinal canal: No prevertebral fluid or swelling. No visible canal hematoma. Disc levels: Multilevel degenerative changes with disc space narrowing and endplate irregularity and spurring primarily at C6-C7. Upper chest: Negative. Other: None IMPRESSION: 1. No acute intracranial pathology. Mild age-related atrophy and chronic microvascular ischemic changes. 2. No acute/traumatic cervical spine pathology. Multilevel degenerative changes. Electronically Signed   By: Anner Crete M.D.   On: 04/14/2021 23:28   CT CERVICAL SPINE WO CONTRAST  Result Date: 04/14/2021 CLINICAL DATA:  70 year old female with neck pain. EXAM: CT HEAD WITHOUT CONTRAST CT CERVICAL SPINE WITHOUT CONTRAST TECHNIQUE: Multidetector CT imaging of the head and cervical spine was performed following the standard protocol without intravenous contrast. Multiplanar CT image reconstructions of the cervical spine were also generated. COMPARISON:  Head CT dated 11/09/2020. FINDINGS: CT HEAD FINDINGS Brain: Mild age-related atrophy and chronic microvascular ischemic changes. There is no acute intracranial hemorrhage. No mass effect or midline shift no extra-axial fluid collection. Vascular: No hyperdense vessel or unexpected calcification. Skull: Normal. Negative for fracture or focal lesion. Sinuses/Orbits: The visualized paranasal sinuses and mastoid air cells are clear. Postsurgical changes of left maxillary sinus. Retained tooth in the left maxillary sinus as seen previously. Other: None CT CERVICAL SPINE FINDINGS Alignment: No acute  subluxation Skull base and vertebrae: No acute fracture. Old-appearing compression of C5 and C6. Osteopenia. Soft tissues and spinal canal: No prevertebral fluid or swelling. No visible canal hematoma. Disc levels: Multilevel degenerative changes with disc space narrowing and endplate irregularity and spurring primarily at C6-C7. Upper chest: Negative. Other: None IMPRESSION: 1. No acute intracranial pathology. Mild age-related atrophy and chronic microvascular ischemic changes. 2. No acute/traumatic cervical spine pathology. Multilevel degenerative changes. Electronically Signed   By: Anner Crete M.D.   On: 04/14/2021 23:28   CT Lumbar Spine Wo Contrast  Result Date: 04/15/2021 CLINICAL DATA:  Lumbar compression fracture EXAM: CT LUMBAR SPINE WITHOUT CONTRAST TECHNIQUE: Multidetector CT imaging of the lumbar spine was performed without intravenous contrast administration. Multiplanar CT image reconstructions were also generated. COMPARISON:  Radiography from yesterday FINDINGS: Segmentation: 5 lumbar type vertebrae Alignment: Normal Vertebrae: L3-L5 solid arthrodesis and no evident hardware complication. Spinal cord stimulator which enter the canal posteriorly at T12-L1. Paraspinal and other soft tissues: No evidence of hemorrhage or mass about the spine. Expected postoperative scarring. Disc levels: L2-3 adjacent segment ligamentous thickening and facet  spurring with moderate spinal stenosis. L5-S1 mild disc narrowing and bulging. IMPRESSION: 1. No acute finding. 2. L3-L5 solid arthrodesis. 3. 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 Electronically Signed   By: Monte Fantasia M.D.   On: 04/15/2021 05:04   DG Hip Unilat W or Wo Pelvis 2-3 Views Right  Result Date: 04/15/2021 CLINICAL DATA:  Right hip pain following fall, initial encounter EXAM: DG HIP (WITH OR WITHOUT PELVIS) 3V RIGHT COMPARISON:  None. FINDINGS: Pelvic ring is intact. Postsurgical changes in the lower lumbar spine are seen. No  acute fracture or dislocation is noted. No soft tissue abnormality is seen. IMPRESSION: No acute abnormality noted. Electronically Signed   By: Inez Catalina M.D.   On: 04/15/2021 00:01   ECHOCARDIOGRAM LIMITED  Result Date: 04/15/2021    ECHOCARDIOGRAM LIMITED REPORT   Patient Name:   ZELL GLOOR Date of Exam: 04/15/2021 Medical Rec #:  [id number]       Height:       66.0 in Accession #:    [id number]      Weight:       260.0 lb Date of Birth:  1951/07/13       BSA:          2.236 m Patient Age:    44 years        BP:           165/74 mmHg Patient Gender: F               HR:           94 bpm. Exam Location:  Inpatient Procedure: 2D Echo, Cardiac Doppler, Color Doppler and Limited Echo Indications:    R07.9* Chest pain, unspecified  History:        Patient has no prior history of Echocardiogram examinations.                 Patient was brought to the hospital because she "didn't know                 what was going on".  Sonographer:    Merrie Roof RDCS Referring Phys: [id number] [place]  1. Left ventricular ejection fraction, by estimation, is 65 to 70%. The left ventricle has hyperdynamic function. The left ventricle has no regional wall motion abnormalities. There is moderate concentric left ventricular hypertrophy. Left ventricular diastolic parameters are consistent with Grade I diastolic dysfunction (impaired relaxation).  2. Right ventricular systolic function is normal. The right ventricular size is normal. Tricuspid regurgitation signal is inadequate for assessing PA pressure.  3. The aortic valve is tricuspid. There is mild calcification of the aortic valve. There is mild thickening of the aortic valve. Mild aortic valve sclerosis is present, with no evidence of aortic valve stenosis.  4. The inferior vena cava is normal in size with greater than 50% respiratory variability, suggesting right atrial pressure of 3 mmHg. FINDINGS  Left Ventricle: Left ventricular ejection fraction, by  estimation, is 65 to 70%. The left ventricle has hyperdynamic function. The left ventricle has no regional wall motion abnormalities. The left ventricular internal cavity size was normal in size. There is moderate concentric left ventricular hypertrophy. Left ventricular diastolic parameters are consistent with Grade I diastolic dysfunction (impaired relaxation). Normal left ventricular filling pressure. Right Ventricle: The right ventricular size is normal. No increase in right ventricular wall thickness. Right ventricular systolic function is normal. Tricuspid regurgitation signal is inadequate for assessing PA pressure. Left Atrium: Left atrial  size was normal in size. Right Atrium: Right atrial size was normal in size. Pericardium: There is no evidence of pericardial effusion. Mitral Valve: There is mild thickening of the mitral valve leaflet(s). Mild mitral annular calcification. Tricuspid Valve: The tricuspid valve is normal in structure. Tricuspid valve regurgitation is not demonstrated. Aortic Valve: The aortic valve is tricuspid. There is mild calcification of the aortic valve. There is mild thickening of the aortic valve. Mild aortic valve sclerosis is present, with no evidence of aortic valve stenosis. Aortic valve mean gradient measures 4.0 mmHg. Aortic valve peak gradient measures 6.6 mmHg. Aortic valve area, by VTI measures 2.41 cm. Pulmonic Valve: The pulmonic valve was grossly normal. Pulmonic valve regurgitation is not visualized. Aorta: The aortic root and ascending aorta are structurally normal, with no evidence of dilitation. Venous: The inferior vena cava is normal in size with greater than 50% respiratory variability, suggesting right atrial pressure of 3 mmHg. IAS/Shunts: No atrial level shunt detected by color flow Doppler. LEFT VENTRICLE PLAX 2D LVIDd:         3.40 cm  Diastology LVIDs:         1.90 cm  LV e' medial:    6.74 cm/s LV PW:         1.50 cm  LV E/e' medial:  10.7 LV IVS:         1.40 cm  LV e' lateral:   10.30 cm/s LVOT diam:     1.90 cm  LV E/e' lateral: 7.0 LV SV:         52 LV SV Index:   23 LVOT Area:     2.84 cm  LEFT ATRIUM           Index LA diam:      3.50 cm 1.57 cm/m LA Vol (A4C): 63.8 ml 28.54 ml/m  AORTIC VALVE AV Area (Vmax):    2.50 cm AV Area (Vmean):   2.44 cm AV Area (VTI):     2.41 cm AV Vmax:           128.00 cm/s AV Vmean:          86.600 cm/s AV VTI:            0.214 m AV Peak Grad:      6.6 mmHg AV Mean Grad:      4.0 mmHg LVOT Vmax:         113.00 cm/s LVOT Vmean:        74.400 cm/s LVOT VTI:          0.182 m LVOT/AV VTI ratio: 0.85  AORTA Ao Root diam: 3.00 cm Ao Asc diam:  3.40 cm MITRAL VALVE MV Area (PHT): 5.23 cm     SHUNTS MV Decel Time: 145 msec     Systemic VTI:  0.18 m MV E velocity: 71.80 cm/s   Systemic Diam: 1.90 cm MV A velocity: 118.00 cm/s MV E/A ratio:  0.61 Mihai Croitoru MD Electronically signed by Sanda Klein MD Signature Date/Time: 04/15/2021/4:24:00 PM    Final         Scheduled Meds:  amLODipine  5 mg Oral Daily   aspirin EC  81 mg Oral Daily   atorvastatin  40 mg Oral QHS   cloNIDine  0.1 mg Oral BID   enoxaparin (LOVENOX) injection  40 mg Subcutaneous Q24H   gabapentin  600 mg Oral BID   And   gabapentin  1,200 mg Oral QHS   insulin aspart  0-15 Units  Subcutaneous TID WC   insulin glargine  20 Units Subcutaneous Daily   isosorbide mononitrate  60 mg Oral Daily   Loteprednol Etabonate  1 drop Both Eyes TID   metoprolol succinate  50 mg Oral Daily   pantoprazole  40 mg Oral BID   sodium chloride flush  3 mL Intravenous Q12H   Continuous Infusions:  sodium chloride 75 mL/hr at 04/16/21 0549     LOS: 1 day    Time spent: More than 50% of that time was spent in counseling and/or coordination of care.      Shelly Coss, MD [facility] P7/08/2021, 8:00 AM

## 2021-04-16 NOTE — Consult Note (Signed)
Orthopaedic Trauma Service (OTS) Consult   Patient ID: [name] MRN: [id number] DOB/AGE: 71/25/1952 70 y.o.  Reason for Consult: Right distal femur fracture Referring Physician: Dr. Ophelia Charter, MD [facility])  HPI: [name] is an 70 y.o. female with medical history significant of hypertension, hyperlipidemia, CAD, DM type II, hypothyroidism, cervical radiculopathy, chronic back pain, anxiety, depression, and GERD being seen in consultation request of Dr. Griffin Basil for right periprosthetic distal femur fracture.  Patient presented to [facility] emergency department on 04/14/2021 for evaluation of chest pain..  Patient's husband, she has had multiple falls in the last several weeks.  He has noticed that over the last few days she has been laying in bed more.  He also states that when she walks that her left leg seems to be giving away and she is complaining about swelling and soreness.  Imaging of the right knee was obtained in the emergency department and patient was found to have a fracture just proximal to her previous knee prosthesis.  Orthopedics was consulted for evaluation and management.  Due to fracture pattern, OR availability, and surgeon availability, Dr. Griffin Basil has asked Dr. Doreatha Martin to assume care of patient for surgical fixation.  Patient seen on the preoperative holding area.  Husband is at bedside.  She is complained of pain in her leg.  No pain in her left leg.  Denies any pain in her upper extremities.  She just feels very fatigued and weak.  This been going on for few months.  They are unsure where it is coming from.  The last time that she fell was few weeks ago.  They do not recall any recent falls but she has been having a hard time putting weight on her leg secondary to the fatigue and pain.  Past Medical History:  Diagnosis Date   Anxiety    Arthritis    Blood dyscrasia    "FREE BLEEDER"   CERVICAL RADICULOPATHY, LEFT    Chronic back pain    CKD  (chronic kidney disease)    DR. SANFORD  [facility]   COMMON MIGRAINE    CORONARY ARTERY DISEASE    Cough    CURRENT COLD   Cystitis    DEPRESSION    Diabetes mellitus, type II (HCC)    DIVERTICULOSIS-COLON    Dysrhythmia    palpitations   Gastric ulcer 04/2008   Gastroparesis    GERD (gastroesophageal reflux disease)    Hiatal hernia    Hyperlipidemia    Hypertension    Hypothyroidism    INSOMNIA-SLEEP DISORDER-UNSPEC    Iron deficiency anemia    Wears glasses     Past Surgical History:  Procedure Laterality Date   ABDOMINAL HYSTERECTOMY     ANTERIOR LAT LUMBAR FUSION Left 08/13/2017   Procedure: LEFT SIDED LUMBAR 3-4 LATERAL INTERBODY FUSION WITH INSTRUMENTATION AND ALLOGRAFT;  Surgeon: Phylliss Bob, MD;  Location: [place];  Service: Orthopedics;  Laterality: Left;  LEFT SIDED LUMBAR 3-4 LATERAL INTERBODY FUSION WITH INSTRUMENTATION AND ALLOGRAFT; REQUEST 3 HOURS   Back Stimulator  07/2018   BACK SURGERY  03/2016   BREAST EXCISIONAL BIOPSY Right    40 years ago   CHOLECYSTECTOMY  06/2009   COLONOSCOPY     ESOPHAGOGASTRODUODENOSCOPY     EYE SURGERY Bilateral    lasik   Gastric Wedge resection lipoma  11/2007   x2 with laparotomy and gastrostomy   JOINT REPLACEMENT     Left knee replacement  Rigth knee replacement with revision  04/2008   Dr. Berenice Primas   ROTATOR CUFF REPAIR Left 01/2009   s/p bladder surgury  09/2009   Dr. Terance Hart   SPINAL CORD STIMULATOR INSERTION N/A 09/11/2018   Procedure: LUMBAR SPINAL CORD STIMULATOR INSERTION;  Surgeon: Clydell Hakim, MD;  Location: [place];  Service: Neurosurgery;  Laterality: N/A;  LUMBAR SPINAL CORD STIMULATOR INSERTION    Family History  Problem Relation Age of Onset   Diabetes Sister        x 3   Heart disease Sister        x2   Breast cancer Sister 22   Diabetes Brother        x3   Heart disease Brother        x2   Coronary artery disease Other        female 1st degree   Hypertension Other    Breast cancer  Sister 58   Breast cancer Other 25   Diabetes Brother    Colon cancer Neg Hx     Social History:  reports that she has quit smoking. She has never used smokeless tobacco. She reports that she does not drink alcohol and does not use drugs.  Allergies:  Allergies  Allergen Reactions   Ciprofloxacin Shortness Of Breath and Other (See Comments)    Dizziness    Hydrocodone Shortness Of Breath and Other (See Comments)    Tachycardia   Hydrocortisone    Penicillins Hives    Pt just remembers hives, SHE HAS HAD AUGEMTIN WITHOUT ISSUES PER PATIENT no SOB or lightheadedness with hypotension: No Has patient had a PCN reaction causing severe rash involving mucus membranes or skin necrosis: No Has patient had a PCN reaction that required hospitalization No Has patient had a PCN reaction occurring within the last 10 years: No If all of the above answers are "NO", then may proceed with Cephalosporin use.     Medications: I have reviewed the patient's current medications. Prior to Admission:  Medications Prior to Admission  Medication Sig Dispense Refill Last Dose   amLODipine (NORVASC) 5 MG tablet Take 5 mg by mouth daily.   04/14/2021   aspirin 81 MG EC tablet Take 81 mg by mouth daily.   04/14/2021   atorvastatin (LIPITOR) 40 MG tablet TAKE ONE TABLET BY MOUTH EVERY NIGHT AT BEDTIME (Patient taking differently: Take 40 mg by mouth at bedtime.) 90 tablet 3 04/13/2021   cloNIDine (CATAPRES) 0.1 MG tablet TAKE ONE TABLET BY MOUTH TWICE A DAY (Patient taking differently: Take 0.1 mg by mouth 2 (two) times daily.) 180 tablet 0 04/14/2021   Dulaglutide (TRULICITY) 1.5 0000000 SOPN Inject 1.5 mg into the skin every Monday.   04/09/2021   ELMIRON 100 MG capsule Take 200 mg by mouth 2 (two) times daily.    04/14/2021   FLUoxetine (PROZAC) 20 MG capsule TAKE ONE CAPSULE BY MOUTH DAILY (Patient taking differently: Take 20 mg by mouth daily. TAKE ONE CAPSULE BY MOUTH DAILY) 90 capsule 3 04/14/2021   gabapentin  (NEURONTIN) 600 MG tablet Take 600-1,200 mg by mouth See admin instructions. 600 mg in the morning and midday 1200 mg at bedtime.   04/14/2021   glucose blood (FREESTYLE TEST STRIPS) test strip Use as instructed to check blood sugar 4 times daily. 100 each 3    glucose blood test strip Use as instructed 100 each 12    Insulin Glargine (BASAGLAR KWIKPEN) 100 UNIT/ML Inject 30 Units into the skin daily.  04/14/2021   insulin lispro (HUMALOG) 100 UNIT/ML injection Inject 3-20 Units into the skin See admin instructions. Per sliding scale 3 times daily   04/14/2021   isosorbide mononitrate (IMDUR) 60 MG 24 hr tablet Take 1 tablet (60 mg total) by mouth daily. 90 tablet 3 04/14/2021   levothyroxine (SYNTHROID) 112 MCG tablet Take 112 mcg by mouth daily.   04/14/2021   linaclotide (LINZESS) 72 MCG capsule Take 1 capsule (72 mcg total) by mouth daily before breakfast. (Patient taking differently: Take 72 mcg by mouth daily as needed (constipation).) 30 capsule 11 Past Month   Loteprednol Etabonate (LOTEMAX SM) 0.38 % GEL Place 1 drop into both eyes 3 (three) times daily.   04/14/2021   megestrol (MEGACE) 40 MG tablet Take 1 tablet (40 mg total) by mouth daily. 30 tablet 0 04/14/2021   metoprolol succinate (TOPROL-XL) 50 MG 24 hr tablet TAKE 1 TABLET BY MOUTH DAILY WITH OR IMMEDIATELY FOLLOWING A MEAL (Patient taking differently: Take 50 mg by mouth daily.) 90 tablet 3 04/14/2021 at 1100   nitroGLYCERIN (NITROSTAT) 0.4 MG SL tablet Place 1 tablet (0.4 mg total) under the tongue every 5 (five) minutes as needed for chest pain. 20 tablet 0 04/14/2021   ondansetron (ZOFRAN) 4 MG tablet Take 1 tablet (4 mg total) by mouth every 6 (six) hours. (Patient taking differently: Take 4 mg by mouth every 8 (eight) hours as needed for vomiting or nausea.) 12 tablet 0 Past Month   pantoprazole (PROTONIX) 40 MG tablet TAKE 1 TABLET(40 MG) BY MOUTH TWICE DAILY (Patient taking differently: Take 40 mg by mouth 2 (two) times daily.) 180 tablet 3  04/14/2021   polyethylene glycol (MIRALAX / GLYCOLAX) 17 g packet Take 17 g by mouth daily as needed. (Patient taking differently: Take 17 g by mouth daily as needed for mild constipation.) 14 each 0 Past Month   tiZANidine (ZANAFLEX) 2 MG tablet Take 1 tablet (2 mg total) by mouth every 6 (six) hours as needed for muscle spasms. 40 tablet 1 unk   traMADol (ULTRAM) 50 MG tablet Take 50 mg by mouth 2 (two) times daily as needed for moderate pain.   Past Month   clindamycin (CLEOCIN) 150 MG capsule Take 150 mg by mouth See admin instructions. Qid x 6 days (Patient not taking: Reported on 04/15/2021)   Completed Course   Continuous Blood Gluc Receiver (FREESTYLE LIBRE 2 READER) DEVI Use as directed twice daily E11.9 1 each 0    Continuous Blood Gluc Sensor (FREESTYLE LIBRE 2 SENSOR) MISC Use as directed once bi-weekly E11.9 6 each 3    ondansetron (ZOFRAN ODT) 4 MG disintegrating tablet Take 1 tablet (4 mg total) by mouth every 8 (eight) hours as needed for up to 15 doses for nausea or vomiting. (Patient not taking: Reported on 04/15/2021) 15 tablet 0 Not Taking    ROS: Constitutional: +Fatigue Vision: No changes in vision ENT: No difficulty swallowing CV: No chest pain Pulm: No SOB or wheezing GI: No nausea or vomiting GU: No urgency or inability to hold urine Skin: No poor wound healing Neurologic: No numbness or tingling Psychiatric: No depression or anxiety Heme: No bruising Allergic: No reaction to medications or food   Exam: Blood pressure (!) 159/76, pulse 97, temperature 98.2 F (36.8 C), resp. rate 19, height '5\' 5"'$  (1.651 m), weight 111.6 kg, SpO2 100 %. General: No acute distress Orientation: Awake alert and oriented x3 Mood and Affect: Cooperative and pleasant Gait: Unable to assess due to  her pain Coordination and balance: Within normal limits  Right lower extremity: Anterior surgical incision is healed.  Pain with attempted knee flexion.  Compartments are soft compressible.   She has active dorsiflexion plantarflexion of her toes and foot.  She has 2+ DP and PT pulses.  Tenderness throughout her distal femur.  No pitting edema or lymphadenopathy.  Left lower extremity: Skin without lesions. No tenderness to palpation. Full painless ROM, full strength in each muscle groups without evidence of instability.   Medical Decision Making: Data: Imaging: AP and lateral views of the right knee show nondisplaced fracture above previous knee prosthesis.  Labs:  Results for orders placed or performed during the hospital encounter of 04/14/21 (from the past 24 hour(s))  Hemoglobin A1c     Status: Abnormal   Collection Time: 04/15/21  8:56 AM  Result Value Ref Range   Hgb A1c MFr Bld 7.0 (H) 4.8 - 5.6 %   Mean Plasma Glucose 154.2 mg/dL  Sedimentation rate     Status: Abnormal   Collection Time: 04/15/21  8:58 AM  Result Value Ref Range   Sed Rate 109 (H) 0 - 22 mm/hr  C-reactive protein     Status: Abnormal   Collection Time: 04/15/21  8:58 AM  Result Value Ref Range   CRP 1.7 (H) <1.0 mg/dL  Folate, serum, performed at [facility] lab     Status: None   Collection Time: 04/15/21  9:10 AM  Result Value Ref Range   Folate 15.3 >5.9 ng/mL  CBG monitoring, ED     Status: Abnormal   Collection Time: 04/15/21  9:19 AM  Result Value Ref Range   Glucose-Capillary 139 (H) 70 - 99 mg/dL  Troponin I (High Sensitivity)     Status: None   Collection Time: 04/15/21  9:31 AM  Result Value Ref Range   Troponin I (High Sensitivity) 10 <18 ng/L  CBG monitoring, ED     Status: Abnormal   Collection Time: 04/15/21 12:07 PM  Result Value Ref Range   Glucose-Capillary 121 (H) 70 - 99 mg/dL  Glucose, capillary     Status: Abnormal   Collection Time: 04/15/21  5:25 PM  Result Value Ref Range   Glucose-Capillary 118 (H) 70 - 99 mg/dL  Glucose, capillary     Status: Abnormal   Collection Time: 04/15/21  7:37 PM  Result Value Ref Range   Glucose-Capillary 120 (H) 70 - 99 mg/dL   CBC     Status: Abnormal   Collection Time: 04/16/21  2:42 AM  Result Value Ref Range   WBC 11.0 (H) 4.0 - 10.5 K/uL   RBC 3.19 (L) 3.87 - 5.11 MIL/uL   Hemoglobin 10.1 (L) 12.0 - 15.0 g/dL   HCT 32.0 (L) 36.0 - 46.0 %   MCV 100.3 (H) 80.0 - 100.0 fL   MCH 31.7 26.0 - 34.0 pg   MCHC 31.6 30.0 - 36.0 g/dL   RDW 13.6 11.5 - 15.5 %   Platelets 373 150 - 400 K/uL   nRBC 0.0 0.0 - 0.2 %  Basic metabolic panel     Status: Abnormal   Collection Time: 04/16/21  2:42 AM  Result Value Ref Range   Sodium 137 135 - 145 mmol/L   Potassium 3.6 3.5 - 5.1 mmol/L   Chloride 105 98 - 111 mmol/L   CO2 21 (L) 22 - 32 mmol/L   Glucose, Bld 153 (H) 70 - 99 mg/dL   BUN 12 8 - 23 mg/dL  Creatinine, Ser 1.07 (H) 0.44 - 1.00 mg/dL   Calcium 9.9 8.9 - 10.3 mg/dL   GFR, Estimated 56 (L) >60 mL/min   Anion gap 11 5 - 15  Glucose, capillary     Status: Abnormal   Collection Time: 04/16/21  8:06 AM  Result Value Ref Range   Glucose-Capillary 142 (H) 70 - 99 mg/dL   *Note: Due to a large number of results and/or encounters for the requested time period, some results have not been displayed. A complete set of results can be found in Results Review.    Assessment/Plan: 70 year old female with right nondisplaced distal femoral periprosthetic fracture  Based on patient's fracture pattern, would recommend proceeding with open reduction internal fixation of the right distal femur.  Risk and benefits of procedure were discussed with the patient and husband. Risks discussed included bleeding, infection, malunion, nonunion, damage to surrounding nerves and blood vessels, pain, hardware prominence or irritation, hardware failure, stiffness, post-traumatic arthritis, DVT/PE, compartment syndrome, and anesthetic complications.  Patient states understanding the risk and agrees to proceed with surgery.  Consent will be obtained.  Shona Needles, MD [facility] [phone]  (office) [email or web address]

## 2021-04-16 NOTE — Anesthesia Postprocedure Evaluation (Signed)
Anesthesia Post Note  Patient: [name]  Procedure(s) Performed: OPEN REDUCTION INTERNAL FIXATION (ORIF) DISTAL FEMUR FRACTURE (Right: Leg Upper)     Anesthesia Post Evaluation No notable events documented.  Last Vitals:  Vitals:   04/16/21 1420 04/16/21 1435  BP: (!) 148/73 128/66  Pulse: 86 74  Resp: 17 12  Temp:    SpO2: 100% 100%    Last Pain:  Vitals:   04/16/21 1455  TempSrc:   PainSc: 6                  Catalina Gravel

## 2021-04-16 NOTE — Anesthesia Preprocedure Evaluation (Addendum)
Anesthesia Evaluation  Patient identified by MRN, date of birth, ID band Patient awake    Reviewed: Allergy & Precautions, NPO status , Patient's Chart, lab work & pertinent test results  Airway Mallampati: II  TM Distance: >3 FB Neck ROM: Full    Dental  (+) Dental Advisory Given, Edentulous Lower, Edentulous Upper   Pulmonary neg pulmonary ROS, former smoker,    Pulmonary exam normal breath sounds clear to auscultation       Cardiovascular hypertension, Pt. on medications + CAD  Normal cardiovascular exam Rhythm:Regular Rate:Normal     Neuro/Psych  Headaches, PSYCHIATRIC DISORDERS Anxiety Depression  Neuromuscular disease    GI/Hepatic Neg liver ROS, hiatal hernia, PUD, GERD  ,  Endo/Other  diabetes, Type 2, Insulin DependentHypothyroidism Morbid obesity  Renal/GU Renal InsufficiencyRenal disease     Musculoskeletal  (+) Arthritis ,   Abdominal   Peds  Hematology  (+) Blood dyscrasia, anemia ,   Anesthesia Other Findings Day of surgery medications reviewed with the patient.  Reproductive/Obstetrics                            Anesthesia Physical Anesthesia Plan  ASA: 3  Anesthesia Plan: General   Post-op Pain Management:    Induction: Intravenous  PONV Risk Score and Plan: 3 and Dexamethasone, Ondansetron and Treatment may vary due to age or medical condition  Airway Management Planned: Oral ETT  Additional Equipment:   Intra-op Plan:   Post-operative Plan: Extubation in OR  Informed Consent: I have reviewed the patients History and Physical, chart, labs and discussed the procedure including the risks, benefits and alternatives for the proposed anesthesia with the patient or authorized representative who has indicated his/her understanding and acceptance.     Dental advisory given  Plan Discussed with: CRNA  Anesthesia Plan Comments:         Anesthesia Quick  Evaluation

## 2021-04-16 NOTE — Plan of Care (Signed)
Tentative plan for ORIF today.   Problem: Education: Goal: Knowledge of General Education information will improve Description: Including pain rating scale, medication(s)/side effects and non-pharmacologic comfort measures Outcome: Progressing   Problem: Health Behavior/Discharge Planning: Goal: Ability to manage health-related needs will improve Outcome: Progressing   Problem: Clinical Measurements: Goal: Ability to maintain clinical measurements within normal limits will improve Outcome: Progressing Goal: Will remain free from infection Outcome: Progressing Goal: Diagnostic test results will improve Outcome: Progressing Goal: Respiratory complications will improve Outcome: Progressing Goal: Cardiovascular complication will be avoided Outcome: Progressing   Problem: Activity: Goal: Risk for activity intolerance will decrease Outcome: Progressing   Problem: Nutrition: Goal: Adequate nutrition will be maintained Outcome: Progressing   Problem: Coping: Goal: Level of anxiety will decrease Outcome: Progressing   Problem: Elimination: Goal: Will not experience complications related to bowel motility Outcome: Progressing Goal: Will not experience complications related to urinary retention Outcome: Progressing   Problem: Pain Managment: Goal: General experience of comfort will improve Outcome: Progressing   Problem: Safety: Goal: Ability to remain free from injury will improve Outcome: Progressing   Problem: Skin Integrity: Goal: Risk for impaired skin integrity will decrease Outcome: Progressing

## 2021-04-16 NOTE — Anesthesia Procedure Notes (Signed)
Procedure Name: Intubation Date/Time: 04/16/2021 1:47 PM Performed by: Jonna Munro, CRNA Pre-anesthesia Checklist: Patient identified, Patient being monitored, Timeout performed, Emergency Drugs available and Suction available Patient Re-evaluated:Patient Re-evaluated prior to induction Oxygen Delivery Method: Circle System Utilized Preoxygenation: Pre-oxygenation with 100% oxygen Induction Type: IV induction Ventilation: Mask ventilation without difficulty Laryngoscope Size: Mac and 3 Grade View: Grade II Tube type: Oral Tube size: 7.0 mm Number of attempts: 1 Airway Equipment and Method: stylet Placement Confirmation: ETT inserted through vocal cords under direct vision, positive ETCO2 and breath sounds checked- equal and bilateral Secured at: 21 cm Tube secured with: Tape Dental Injury: Teeth and Oropharynx as per pre-operative assessment

## 2021-04-16 NOTE — Transfer of Care (Signed)
Immediate Anesthesia Transfer of Care Note  Patient: [name]  Procedure(s) Performed: OPEN REDUCTION INTERNAL FIXATION (ORIF) DISTAL FEMUR FRACTURE (Right: Leg Upper)  Patient Location: PACU  Anesthesia Type:General  Level of Consciousness: awake, drowsy and patient cooperative  Airway & Oxygen Therapy: Patient Spontanous Breathing and Patient connected to face mask  Post-op Assessment: Report given to RN and Post -op Vital signs reviewed and stable  Post vital signs: Reviewed and stable  Last Vitals:  Vitals Value Taken Time  BP    Temp    Pulse 90 04/16/21 1417  Resp 17 04/16/21 1417  SpO2 100 % 04/16/21 1417  Vitals shown include unvalidated device data.  Last Pain:  Vitals:   04/16/21 1141  TempSrc: Oral  PainSc:          Complications: No notable events documented.

## 2021-04-16 NOTE — Op Note (Signed)
Orthopaedic Surgery Operative Note (CSN: [id number] ) Date of Surgery: 04/16/2021  Admit Date: 04/14/2021   Diagnoses: Pre-Op Diagnoses: Right supracondylar distal femur fracture  Post-Op Diagnosis: Same  Procedures: CPT 27511-Open reduction internal fixation of right femur fracture  Surgeons : Primary: Shona Needles, MD  Assistant: Patrecia Pace, PA-C  Location: OR 8   Anesthesia:General   Antibiotics: Ancef 2g preop with 1 gm vancomycin powder placed topically   Tourniquet time:None  Estimated Blood [id number]  Complications:None  Specimens:None   Implants: Implant Name Type Inv. Item Serial No. Manufacturer Lot No. LRB No. Used Action  PLATE FEM DIST NCB PP 278MM - [id number] Plate PLATE FEM DIST NCB PP 278MM  [employer](ORTH,TRAU,BIO,SG)  Right 1 Implanted  SCREW 5.0 80MM - [id number] Screw SCREW 5.0 80MM  [employer](ORTH,TRAU,BIO,SG)  Right 4 Implanted  SCREW NCB 5.0X38 - [id number] Screw SCREW NCB 5.0X38  [employer](ORTH,TRAU,BIO,SG)  Right 1 Implanted  SCREW NCB 5.0X38 - [id number] Screw SCREW NCB 5.0X38  [employer](ORTH,TRAU,BIO,SG)  Right 2 Implanted  SCREW NCB 4.0MX38M - [id number] Screw SCREW NCB 4.0MX38M  [employer](ORTH,TRAU,BIO,SG)  Right 1 Implanted  CAP LOCK NCB - [id number] Cap CAP LOCK NCB  [employer](ORTH,TRAU,BIO,SG)  Right 7 Implanted     Indications for Surgery: 70 year old female who has had multiple falls and significant fatigue presented for global fatigue and failure to thrive.  She was complaining of right leg and knee pain.  X-ray showed a nondisplaced spiral oblique distal femur fracture.  Due to her weakness and difficulty ambulating and continued pain I recommended proceeding with open reduction internal fixation.  Risks and benefits were discussed with the patient and her husband.  Risks included but not limited to bleeding, infection, malunion, nonunion, hardware failure, hardware irritation, nerve or blood vessel injury, DVT, even the  possibility anesthetic complications.  They agreed to proceed with surgery and consent was obtained.  Operative Findings: Open reduction internal fixation of right distal femur fracture using [employer] NCB 12 hole distal femoral locking plate.  Procedure: The patient was identified in the preoperative holding area. Consent was confirmed with the patient and their family and all questions were answered. The operative extremity was marked after confirmation with the patient. she was then brought back to the operating room by our anesthesia colleagues.  She was placed under general anesthetic and carefully transferred over to a radiolucent flat top table.  A bump was placed under her operative hip.  The right lower extremity was then prepped and draped in usual sterile fashion.  A timeout was performed to verify the patient, the procedure, and the extremity.  Preoperative antibiotics were dosed.  Fluoroscopic imaging was obtained showed the fracture that was in place.  The hip and knee were flexed over a triangle.  A lateral approach the distal femur was carried down through skin and subcutaneous tissue.  I split the IT band in line with my incision.  I exposed the lateral cortex of the distal femur.  I then developed the path along the lateral cortex of the femur to pass a 12 hole [employer] NCB distal femoral locking plate with the targeting arm attached.  I aligned it appropriately distally and held it provisionally with a 2.0 mm K wire.  Proximally I made a percutaneous incision and placed a 3.3 mm drill bit bicortically.  Once I was pleased with the alignment and the position of the plate I then placed a 5.0 millimeter screws distally to bring the plate flush to  bone.  I then placed percutaneous 5.0 millimeter screws into the femoral shaft to bring the proximal portion of the plate flush to bone.  I then removed the 3.3 mm drill bit and placed a 4.0 millimeter screw.  Locking caps were placed on the  5.0 millimeter screws into the femoral shaft.  I then removed the targeting arm and placed 3 more 5.0 millimeter screws in the distal segment.  I then placed locking caps on all of the distal screws.  Final fluoroscopic imaging was obtained.  The incision was copiously irrigated.  A gram of vancomycin powder was placed into the incision.  Layered closure of 0 Vicryl, 2-0 Vicryl, 3-0 Monocryl and Dermabond was used to close the skin.  Sterile dressing was placed.  The patient was then awoken from anesthesia and taken to the PACU in stable condition.  Post Op Plan/Instructions: Patient may be weightbearing as tolerated to the right lower extremity.  She will receive postoperative clindamycin.  She will be placed on Lovenox for DVT prophylaxis.  We will have her mobilize with physical and Occupational Therapy.  I was present and performed the entire surgery.  Patrecia Pace, PA-C did assist me throughout the case. An assistant was necessary given the difficulty in approach, maintenance of reduction and ability to instrument the fracture.   Katha Hamming, MD [facility]

## 2021-04-17 ENCOUNTER — Ambulatory Visit: Payer: Medicare Other | Admitting: Internal Medicine

## 2021-04-17 ENCOUNTER — Telehealth: Payer: Self-pay | Admitting: Internal Medicine

## 2021-04-17 ENCOUNTER — Encounter (HOSPITAL_COMMUNITY): Payer: Self-pay | Admitting: Student

## 2021-04-17 ENCOUNTER — Inpatient Hospital Stay (HOSPITAL_COMMUNITY): Payer: Medicare Other

## 2021-04-17 LAB — BASIC METABOLIC PANEL
Anion gap: 11 (ref 5–15)
BUN: 12 mg/dL (ref 8–23)
CO2: 18 mmol/L — ABNORMAL LOW (ref 22–32)
Calcium: 9.3 mg/dL (ref 8.9–10.3)
Chloride: 107 mmol/L (ref 98–111)
Creatinine, Ser: 1.01 mg/dL — ABNORMAL HIGH (ref 0.44–1.00)
GFR, Estimated: 60 mL/min — ABNORMAL LOW (ref >60)
Glucose, Bld: 147 mg/dL — ABNORMAL HIGH (ref 70–99)
Potassium: 3.9 mmol/L (ref 3.5–5.1)
Sodium: 136 mmol/L (ref 135–145)

## 2021-04-17 LAB — GLUCOSE, CAPILLARY
Glucose-Capillary: 155 mg/dL — ABNORMAL HIGH (ref 70–99)
Glucose-Capillary: 171 mg/dL — ABNORMAL HIGH (ref 70–99)
Glucose-Capillary: 134 mg/dL — ABNORMAL HIGH (ref 70–99)
Glucose-Capillary: 146 mg/dL — ABNORMAL HIGH (ref 70–99)

## 2021-04-17 LAB — CBC WITH DIFFERENTIAL/PLATELET
Abs Immature Granulocytes: 0.09 10*3/uL — ABNORMAL HIGH (ref 0.00–0.07)
Basophils Absolute: 0 10*3/uL (ref 0.0–0.1)
Basophils Relative: 0 %
Eosinophils Absolute: 0.1 10*3/uL (ref 0.0–0.5)
Eosinophils Relative: 1 %
HCT: 30.2 % — ABNORMAL LOW (ref 36.0–46.0)
Hemoglobin: 9.5 g/dL — ABNORMAL LOW (ref 12.0–15.0)
Immature Granulocytes: 1 %
Lymphocytes Relative: 16 %
Lymphs Abs: 2.2 10*3/uL (ref 0.7–4.0)
MCH: 32.1 pg (ref 26.0–34.0)
MCHC: 31.5 g/dL (ref 30.0–36.0)
MCV: 102 fL — ABNORMAL HIGH (ref 80.0–100.0)
Monocytes Absolute: 0.6 10*3/uL (ref 0.1–1.0)
Monocytes Relative: 4 %
Neutro Abs: 10.9 10*3/uL — ABNORMAL HIGH (ref 1.7–7.7)
Neutrophils Relative %: 78 %
Platelets: 366 10*3/uL (ref 150–400)
RBC: 2.96 MIL/uL — ABNORMAL LOW (ref 3.87–5.11)
RDW: 13.8 % (ref 11.5–15.5)
WBC: 13.9 10*3/uL — ABNORMAL HIGH (ref 4.0–10.5)
nRBC: 0 % (ref 0.0–0.2)

## 2021-04-17 LAB — VITAMIN D 25 HYDROXY (VIT D DEFICIENCY, FRACTURES): Vit D, 25-Hydroxy: 8.74 ng/mL — ABNORMAL LOW (ref 30–100)

## 2021-04-17 LAB — AMMONIA: Ammonia: 35 umol/L (ref 9–35)

## 2021-04-17 MED ORDER — HYDROMORPHONE HCL 1 MG/ML IJ SOLN
1.0000 mg | INTRAMUSCULAR | Status: DC | PRN
  Administered 2021-04-17 – 2021-04-18 (×4): 1 mg via INTRAVENOUS
  Filled 2021-04-17 (×4): qty 1

## 2021-04-17 MED ORDER — AMLODIPINE BESYLATE 10 MG PO TABS
10.0000 mg | ORAL_TABLET | Freq: Every day | ORAL | Status: DC
  Administered 2021-04-17 – 2021-04-24 (×8): 10 mg via ORAL
  Filled 2021-04-17 (×8): qty 1

## 2021-04-17 MED ORDER — GABAPENTIN 100 MG PO CAPS
100.0000 mg | ORAL_CAPSULE | Freq: Two times a day (BID) | ORAL | Status: DC
  Administered 2021-04-17 – 2021-04-18 (×2): 100 mg via ORAL
  Filled 2021-04-17 (×2): qty 1

## 2021-04-17 NOTE — Plan of Care (Signed)

## 2021-04-17 NOTE — Progress Notes (Signed)
PROGRESS NOTE    [name]  [id number] DOB: 1951/08/05 DOA: 04/14/2021 PCP: Biagio Borg, MD   Chief Complain: Chest pressure  Brief Narrative:  Patient is a 70 year old female with history of hypertension, hyperlipidemia, coronary artery disease, diabetes type 2, hypothyroidism, cervical radiculopathy, chronic back pain, anxiety, depression, GERD who presented with chest pressure from home.  Recently, she was found to be weak, falling frequently, confused, fatigued, had weight loss.  On presentation she was hemodynamically stable.  CT head and CT cervical spine did not show any acute abnormalities.  Chest x-ray did not show any pneumonia.  Lab work showed mild leukocytosis.  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  Orthopedics consulted and she underwent  ORIF on 04/16/21.  Assessment & Plan:   Principal Problem:   Weakness Active Problems:   HLD (hyperlipidemia)   Macrocytic anemia   Hypothyroidism   Left lower quadrant abdominal pain   CKD (chronic kidney disease), stage III (HCC)   Leg pain, lateral, right   Acute metabolic encephalopathy   Recurrent falls   Type 2 diabetes mellitus (HCC)   Weight loss   Leukocytosis   Iatrogenic thyrotoxicosis   Generalized weakness/recurrent falls/right distal femur fracture: Lives with husband at home.  Found to be weak, needing assistance with ambulation, confusion.  CT head/cervical spine did not show any acute fractures.  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  Orthopedics consulted and underwent  ORIF on 04/16/21. Continue pain management, supportive care.  PT/OT evaluation requested. On lovenox for dvt ppx  Acute metabolic encephalopathy: Patient found to be confused since last 3 to 4 weeks at home.  CT head did not show any acute abnormalities.  Cundt have MRI of the brain due to  neurostimulator in her back.  Continue to monitor mental status. Normal ammonia,pending  vitamin B1.Normal Vitamin 123456 ,folic acid. She was on high dose of gabapentin which has been decreased.  We will try to minimize sedatives.  Keep the room bright with day sunlight.  Husband denies any history of dementia.  Husband thinks that high level of thyroid hormone could have contributed to that. Will check CT head again in the next 24 to 48 hours.  Leukocytosis: Mild.  X-ray did not show any pneumonia.  Urinalysis was not suggestive of UTI.  Continue monitoring.  Abdominal pain/recent  history of diverticulitis: She was recently treated for acute diverticulitis twice, in May and June.  Abdomen/pelvis CT didn't show any evidence of bowel wall thickening, distention, or inflammatory changes. Showed colonic diverticulosis without evidence of acute diverticulitis.  History of hypothyroidism/now hyperthyroid: Low TSH, elevated free T4.  Takes levothyroxine at home which will be held for now.  Patient will be advised to repeat TFT in 4 weeks and follow-up with her endocrinologist.  Macrocytic anemia: Hemoglobin currently stable in in the range of 9-10.  Normal vitamin 123456, folic acid level.  Hypertension: Continue home regimen.  On metoprolol, amlodipine, clonidine, isosorbide mononitrate  History of weight loss/poor appetite: We have consulted dietitian  History of diabetes type 2/peripheral neuropathy: HbA1c of 7.Has history of diabetic neuropathy.  On Lantus, Humalog, Trulicity at home.  Continue to monitor blood sugars.  Continue current insulin regimen.  Continue gabapentin for the neuropathy with decreased dose due to encephalopathy  CKD stage IIIb: Currently kidney function at baseline.  Hyperlipidemia: On Lipitor  GERD:  On Protonix  Chronic pain syndrome: Continue supportive care, pain management             DVT prophylaxis:Lovenox Code Status:Full  Family Communication: Discussed  with husband at bedside on 04/17/2021  status is: Inpatient  Remains inpatient appropriate because:Inpatient level of care appropriate due to severity of illness  Dispo: The patient is from: Home              Anticipated d/c is to: SNF              Patient currently is not medically stable to d/c.   Difficult to place patient No     Consultants: Orthopedics  Procedures:Plan for ORIF  Antimicrobials:  Anti-infectives (From admission, onward)    Start     Dose/Rate Route Frequency Ordered Stop   04/16/21 2100  clindamycin (CLEOCIN) IVPB 900 mg       Note to Pharmacy: Please give 8 hours from intraoperative dose   900 mg 100 mL/hr over 30 Minutes Intravenous Every 8 hours 04/16/21 1619 04/17/21 2159   04/16/21 1344  vancomycin (VANCOCIN) powder  Status:  Discontinued          As needed 04/16/21 1344 04/16/21 1412   04/16/21 1230  clindamycin (CLEOCIN) IVPB 900 mg        900 mg 100 mL/hr over 30 Minutes Intravenous  Once 04/16/21 1225 04/16/21 1307   04/16/21 1226  clindamycin (CLEOCIN) 900 MG/50ML IVPB       Note to Pharmacy: Cameron Sprang   : cabinet override      04/16/21 1226 04/16/21 1323       Subjective:  Patient seen and examined the bedside this morning.  Hemodynamically stable.  Comfortable.  Denies any significant pain on the operated side.  Oriented to place but not oriented to time.  Objective: Vitals:   04/16/21 1600 04/16/21 1700 04/16/21 2031 04/17/21 0530  BP: (!) 159/84 (!) 154/76 (!) 141/77 (!) 158/88  Pulse: 80 86 87 91  Resp: '11 17 18 19  '$ Temp: (!) 96.8 F (36 C) (!) 97.5 F (36.4 C) 98 F (36.7 C) 98.1 F (36.7 C)  TempSrc:  Oral Oral Oral  SpO2: 100% 98% 100% 100%  Weight:      Height:        Intake/Output Summary (Last 24 hours) at 04/17/2021 0750 Last data filed at 04/16/2021 1600 Gross per 24 hour  Intake 2407.85 ml  Output 950 ml  Net 1457.85 ml   Filed Weights   04/15/21 0721 04/15/21 1720 04/16/21 1141  Weight: 117.9 kg 111.6  kg 111.6 kg    Examination:  General exam: Overall comfortable, not in distress,obese HEENT: PERRL Respiratory system:  no wheezes or crackles  Cardiovascular system: S1 & S2 heard, RRR.  Gastrointestinal system: Abdomen is nondistended, soft and nontender. Central nervous system: Alert and awake, oriented to place only Extremities: No edema, no clubbing ,no cyanosis, surgical wound on the right thigh Skin: No rashes, no ulcers,no icterus     Data Reviewed: I have personally reviewed following labs and imaging studies  CBC: Recent Labs  Lab 04/15/21 0235 04/15/21 0733 04/16/21 0242 04/17/21 0415  WBC 11.1*  --  11.0* 13.9*  NEUTROABS 7.5  --   --  10.9*  HGB 9.8* 10.9* 10.1* 9.5*  HCT 30.2* 33.3* 32.0* 30.2*  MCV 101.3*  --  100.3* 102.0*  PLT 346  --  373 [id number]   Basic Metabolic Panel: Recent Labs  Lab 04/15/21 0235  04/16/21 0242 04/17/21 0415  NA 139 137 136  K 3.7 3.6 3.9  CL 109 105 107  CO2 22 21* 18*  GLUCOSE 138* 153* 147*  BUN '15 12 12  '$ CREATININE 1.16* 1.07* 1.01*  CALCIUM 9.6 9.9 9.3   GFR: Estimated Creatinine Clearance: 63.6 mL/min (A) (by C-G formula based on SCr of 1.01 mg/dL (H)). Liver Function Tests: Recent Labs  Lab 04/15/21 0235  AST 14*  ALT 13  ALKPHOS 84  BILITOT 1.2  PROT 7.0  ALBUMIN 3.1*   No results for input(s): LIPASE, AMYLASE in the last 168 hours. Recent Labs  Lab 04/17/21 0415  AMMONIA 35   Coagulation Profile: No results for input(s): INR, PROTIME in the last 168 hours. Cardiac Enzymes: No results for input(s): CKTOTAL, CKMB, CKMBINDEX, TROPONINI in the last 168 hours. BNP (last 3 results) No results for input(s): PROBNP in the last 8760 hours. HbA1C: Recent Labs    04/15/21 0856  HGBA1C 7.0*   CBG: Recent Labs  Lab 04/16/21 0806 04/16/21 1142 04/16/21 1424 04/16/21 1638 04/16/21 2026  GLUCAP 142* 143* 122* 155* 159*   Lipid Profile: No results for input(s): CHOL, HDL, LDLCALC, TRIG, CHOLHDL,  LDLDIRECT in the last 72 hours. Thyroid Function Tests: Recent Labs    04/15/21 0722  TSH 0.110*  FREET4 1.67*   Anemia Panel: Recent Labs    04/15/21 0733 04/15/21 0910  VITAMINB12 915*  --   FOLATE  --  15.3   Sepsis Labs: No results for input(s): PROCALCITON, LATICACIDVEN in the last 168 hours.  Recent Results (from the past 240 hour(s))  Resp Panel by RT-PCR (Flu A&B, Covid) Nasopharyngeal Swab     Status: None   Collection Time: 04/14/21 12:51 AM   Specimen: Nasopharyngeal Swab; Nasopharyngeal(NP) swabs in vial transport medium  Result Value Ref Range Status   SARS Coronavirus 2 by RT PCR NEGATIVE NEGATIVE Final    Comment: (NOTE) SARS-CoV-2 target nucleic acids are NOT DETECTED.  The SARS-CoV-2 RNA is generally detectable in upper respiratory specimens during the acute phase of infection. The lowest concentration of SARS-CoV-2 viral copies this assay can detect is 138 copies/mL. A negative result does not preclude SARS-Cov-2 infection and should not be used as the sole basis for treatment or other patient management decisions. A negative result may occur with  improper specimen collection/handling, submission of specimen other than nasopharyngeal swab, presence of viral mutation(s) within the areas targeted by this assay, and inadequate number of viral copies(<138 copies/mL). A negative result must be combined with clinical observations, patient history, and epidemiological information. The expected result is Negative.  Fact Sheet for Patients:  EntrepreneurPulse.com.au  Fact Sheet for Healthcare Providers:  IncredibleEmployment.be  This test is no t yet approved or cleared by the [place] FDA and  has been authorized for detection and/or diagnosis of SARS-CoV-2 by FDA under an Emergency Use Authorization (EUA). This EUA will remain  in effect (meaning this test can be used) for the duration of the COVID-19 declaration  under Section 564(b)(1) of the Act, 21 U.S.C.section 360bbb-3(b)(1), unless the authorization is terminated  or revoked sooner.       Influenza A by PCR NEGATIVE NEGATIVE Final   Influenza B by PCR NEGATIVE NEGATIVE Final    Comment: (NOTE) The Xpert Xpress SARS-CoV-2/FLU/RSV plus assay is intended as an aid in the diagnosis of influenza from Nasopharyngeal swab specimens and should not be used as a sole basis for treatment. Nasal washings and aspirates are unacceptable for  Xpert Xpress SARS-CoV-2/FLU/RSV testing.  Fact Sheet for Patients: EntrepreneurPulse.com.au  Fact Sheet for Healthcare Providers: IncredibleEmployment.be  This test is not yet approved or cleared by the [place] FDA and has been authorized for detection and/or diagnosis of SARS-CoV-2 by FDA under an Emergency Use Authorization (EUA). This EUA will remain in effect (meaning this test can be used) for the duration of the COVID-19 declaration under Section 564(b)(1) of the Act, 21 U.S.C. section 360bbb-3(b)(1), unless the authorization is terminated or revoked.  Performed at [facility], [place] [address], [place], [place] [address]   Urine culture     Status: Abnormal   Collection Time: 04/15/21  6:10 AM   Specimen: Urine, Random  Result Value Ref Range Status   Specimen Description URINE, RANDOM  Final   Special Requests NONE  Final   Culture (A)  Final    <10,000 COLONIES/mL INSIGNIFICANT GROWTH Performed at [facility], [place] [address] [place], [place] [address]    Report Status 04/16/2021 FINAL  Final  MRSA Next Gen by PCR, Nasal     Status: None   Collection Time: 04/16/21 11:09 AM   Specimen: Nasal Mucosa; Nasal Swab  Result Value Ref Range Status   MRSA by PCR Next Gen NOT DETECTED NOT DETECTED Final    Comment: (NOTE) The GeneXpert MRSA Assay (FDA approved for NASAL specimens only), is one component of a comprehensive MRSA colonization  surveillance program. It is not intended to diagnose MRSA infection nor to guide or monitor treatment for MRSA infections. Test performance is not FDA approved in patients less than 23 years old. Performed at [facility], [place] [address] [place], [place] [address]          Radiology Studies: CT ABDOMEN PELVIS WO CONTRAST  Result Date: 04/15/2021 CLINICAL DATA:  70 year old female with acute abdominal and pelvic pain. EXAM: CT ABDOMEN AND PELVIS WITHOUT CONTRAST TECHNIQUE: Multidetector CT imaging of the abdomen and pelvis was performed following the standard protocol without IV contrast. COMPARISON:  03/21/2021 and prior CTs FINDINGS: Please note that parenchymal abnormalities may be missed without intravenous contrast. Lower chest: No acute abnormality Hepatobiliary: The liver is unremarkable. The patient is status post cholecystectomy. No biliary dilatation. Pancreas: Unremarkable Spleen: Unremarkable Adrenals/Urinary Tract: The kidneys, adrenal glands and bladder are unremarkable. Stable pelvic calcifications are noted. Stomach/Bowel: Gastric surgical changes are identified. Appendix appears normal. No evidence of bowel wall thickening, distention, or inflammatory changes. Colonic diverticulosis without evidence of acute diverticulitis noted. Vascular/Lymphatic: Aortic atherosclerosis. No enlarged abdominal or pelvic lymph nodes. Reproductive: Status post hysterectomy. No adnexal masses. Other: No ascites, focal collection or pneumoperitoneum. LEFT LATERAL abdominal wall hernia and LEFT inguinal hernia containing fat are unchanged. Musculoskeletal: No acute or suspicious bony abnormalities are identified. Posterior/interbody fusion changes from L3-L5 again noted. Neurostimulator in the LOWER thoracic region noted. IMPRESSION: 1. No evidence of acute abnormality. 2. Unchanged LEFT LATERAL abdominal wall hernia and LEFT inguinal hernia containing fat. 3. Aortic Atherosclerosis (ICD10-I70.0).  Electronically Signed   By: Margarette Canada M.D.   On: 04/15/2021 12:39   DG Knee 1-2 Views Right  Result Date: 04/15/2021 CLINICAL DATA:  Bilateral leg weakness for the past 2 weeks. No known injury. EXAM: RIGHT KNEE - 1-2 VIEW COMPARISON:  Plain film of the RIGHT knee dated 02/03/2016. FINDINGS: Lucency within the distal RIGHT femur diaphysis, most suggestive of acute nondisplaced fracture line. Overall osseous alignment is normal. RIGHT knee arthroplasty hardware appears intact and appropriately positioned.  IMPRESSION: 1. Acute-appearing nondisplaced fracture line within the distal RIGHT femur diaphysis. 2. RIGHT knee arthroplasty hardware appears intact and appropriately positioned. Electronically Signed   By: Franki Cabot M.D.   On: 04/15/2021 09:21   DG Knee Right Port  Result Date: 04/16/2021 CLINICAL DATA:  ORIF distal femoral fracture EXAM: PORTABLE RIGHT KNEE - 4 VIEW COMPARISON:  04/16/2021 fluoroscopic spot images, and knee radiographs from 04/15/2021 FINDINGS: Four views of the right knee demonstrate a lateral plate and screw fixator along the distal femur believed to be spanning the oblique and longitudinal nondisplaced fracture of the distal femur which extends into the lateral metaphysis. Four distal bicondylar screws and 1 or more proximal screws are present. Proximally and distally the plate appears to have close proximity to the cortex although there does appear to be a 5 mm gap between the plate and cortex at the distal metadiaphysis. A total knee prosthesis is observed. Heterotopic calcification along the distal quadriceps, chronic. IMPRESSION: Lateral ORIF of the distal femur fracture, with plate and screw fixator spanning the nondisplaced fracture as noted above. Electronically Signed   By: Van Clines M.D.   On: 04/16/2021 21:01   DG C-Arm 1-60 Min  Result Date: 04/16/2021 CLINICAL DATA:  Surgery, elective. Additional history provided: Open reduction internal fixation (ORIF)  distal femur fracture. Provided fluoroscopy time 45 seconds (3.22 mGy). EXAM: RIGHT FEMUR 2 VIEWS; DG C-ARM 1-60 MIN COMPARISON:  Radiographs of the right knee 04/15/2021. FINDINGS: Five intraoperative fluoroscopic images of the right femur are submitted. On the provided images, there are findings of interval ORIF of a known right femoral diaphyseal fracture (utilizing a lateral plate and multiple interlocking screws). Alignment appears anatomic. Redemonstrated sequela of prior right knee arthroplasty. IMPRESSION: Five intraoperative fluoroscopic images of the right femur from right femoral ORIF, as described. Electronically Signed   By: Kellie Simmering DO   On: 04/16/2021 15:31   DG FEMUR, MIN 2 VIEWS RIGHT  Result Date: 04/16/2021 CLINICAL DATA:  Surgery, elective. Additional history provided: Open reduction internal fixation (ORIF) distal femur fracture. Provided fluoroscopy time 45 seconds (3.22 mGy). EXAM: RIGHT FEMUR 2 VIEWS; DG C-ARM 1-60 MIN COMPARISON:  Radiographs of the right knee 04/15/2021. FINDINGS: Five intraoperative fluoroscopic images of the right femur are submitted. On the provided images, there are findings of interval ORIF of a known right femoral diaphyseal fracture (utilizing a lateral plate and multiple interlocking screws). Alignment appears anatomic. Redemonstrated sequela of prior right knee arthroplasty. IMPRESSION: Five intraoperative fluoroscopic images of the right femur from right femoral ORIF, as described. Electronically Signed   By: Kellie Simmering DO   On: 04/16/2021 15:31   ECHOCARDIOGRAM LIMITED  Result Date: 04/15/2021    ECHOCARDIOGRAM LIMITED REPORT   Patient Name:   [name] Date of Exam: 04/15/2021 Medical Rec #:  [id number]       Height:       66.0 in Accession #:    [id number]      Weight:       260.0 lb Date of Birth:  Jan 01, 1951       BSA:          2.236 m Patient Age:    13 years        BP:           165/74 mmHg Patient Gender: F               HR:           94  bpm. Exam Location:  Inpatient Procedure: 2D Echo, Cardiac Doppler, Color Doppler and Limited Echo Indications:    R07.9* Chest pain, unspecified  History:        Patient has no prior history of Echocardiogram examinations.                 Patient was brought to the hospital because she "didn't know                 what was going on".  Sonographer:    Merrie Roof RDCS Referring Phys: [id number] [place]  1. Left ventricular ejection fraction, by estimation, is 65 to 70%. The left ventricle has hyperdynamic function. The left ventricle has no regional wall motion abnormalities. There is moderate concentric left ventricular hypertrophy. Left ventricular diastolic parameters are consistent with Grade I diastolic dysfunction (impaired relaxation).  2. Right ventricular systolic function is normal. The right ventricular size is normal. Tricuspid regurgitation signal is inadequate for assessing PA pressure.  3. The aortic valve is tricuspid. There is mild calcification of the aortic valve. There is mild thickening of the aortic valve. Mild aortic valve sclerosis is present, with no evidence of aortic valve stenosis.  4. The inferior vena cava is normal in size with greater than 50% respiratory variability, suggesting right atrial pressure of 3 mmHg. FINDINGS  Left Ventricle: Left ventricular ejection fraction, by estimation, is 65 to 70%. The left ventricle has hyperdynamic function. The left ventricle has no regional wall motion abnormalities. The left ventricular internal cavity size was normal in size. There is moderate concentric left ventricular hypertrophy. Left ventricular diastolic parameters are consistent with Grade I diastolic dysfunction (impaired relaxation). Normal left ventricular filling pressure. Right Ventricle: The right ventricular size is normal. No increase in right ventricular wall thickness. Right ventricular systolic function is normal. Tricuspid regurgitation signal is inadequate  for assessing PA pressure. Left Atrium: Left atrial size was normal in size. Right Atrium: Right atrial size was normal in size. Pericardium: There is no evidence of pericardial effusion. Mitral Valve: There is mild thickening of the mitral valve leaflet(s). Mild mitral annular calcification. Tricuspid Valve: The tricuspid valve is normal in structure. Tricuspid valve regurgitation is not demonstrated. Aortic Valve: The aortic valve is tricuspid. There is mild calcification of the aortic valve. There is mild thickening of the aortic valve. Mild aortic valve sclerosis is present, with no evidence of aortic valve stenosis. Aortic valve mean gradient measures 4.0 mmHg. Aortic valve peak gradient measures 6.6 mmHg. Aortic valve area, by VTI measures 2.41 cm. Pulmonic Valve: The pulmonic valve was grossly normal. Pulmonic valve regurgitation is not visualized. Aorta: The aortic root and ascending aorta are structurally normal, with no evidence of dilitation. Venous: The inferior vena cava is normal in size with greater than 50% respiratory variability, suggesting right atrial pressure of 3 mmHg. IAS/Shunts: No atrial level shunt detected by color flow Doppler. LEFT VENTRICLE PLAX 2D LVIDd:         3.40 cm  Diastology LVIDs:         1.90 cm  LV e' medial:    6.74 cm/s LV PW:         1.50 cm  LV E/e' medial:  10.7 LV IVS:        1.40 cm  LV e' lateral:   10.30 cm/s LVOT diam:     1.90 cm  LV E/e' lateral: 7.0 LV SV:         52 LV SV  Index:   23 LVOT Area:     2.84 cm  LEFT ATRIUM           Index LA diam:      3.50 cm 1.57 cm/m LA Vol (A4C): 63.8 ml 28.54 ml/m  AORTIC VALVE AV Area (Vmax):    2.50 cm AV Area (Vmean):   2.44 cm AV Area (VTI):     2.41 cm AV Vmax:           128.00 cm/s AV Vmean:          86.600 cm/s AV VTI:            0.214 m AV Peak Grad:      6.6 mmHg AV Mean Grad:      4.0 mmHg LVOT Vmax:         113.00 cm/s LVOT Vmean:        74.400 cm/s LVOT VTI:          0.182 m LVOT/AV VTI ratio: 0.85  AORTA Ao  Root diam: 3.00 cm Ao Asc diam:  3.40 cm MITRAL VALVE MV Area (PHT): 5.23 cm     SHUNTS MV Decel Time: 145 msec     Systemic VTI:  0.18 m MV E velocity: 71.80 cm/s   Systemic Diam: 1.90 cm MV A velocity: 118.00 cm/s MV E/A ratio:  0.61 Mihai Croitoru MD Electronically signed by Sanda Klein MD Signature Date/Time: 04/15/2021/4:24:00 PM    Final         Scheduled Meds:  amLODipine  5 mg Oral Daily   aspirin EC  81 mg Oral Daily   atorvastatin  40 mg Oral QHS   Chlorhexidine Gluconate Cloth  6 each Topical Q0600   cloNIDine  0.1 mg Oral BID   docusate sodium  100 mg Oral BID   enoxaparin (LOVENOX) injection  40 mg Subcutaneous Q24H   gabapentin  200 mg Oral BID   insulin aspart  0-15 Units Subcutaneous TID WC   insulin glargine  20 Units Subcutaneous Daily   isosorbide mononitrate  60 mg Oral Daily   Loteprednol Etabonate  1 drop Both Eyes TID   metoprolol succinate  50 mg Oral Daily   pantoprazole  40 mg Oral BID   sodium chloride flush  3 mL Intravenous Q12H   Continuous Infusions:  sodium chloride 75 mL/hr at 04/17/21 0259   clindamycin (CLEOCIN) IV 900 mg (04/17/21 0421)   methocarbamol (ROBAXIN) IV       LOS: 2 days    Time spent: 25 mins,More than 50% of that time was spent in counseling and/or coordination of care.      Shelly Coss, MD [facility] P7/09/2021, 7:50 AM

## 2021-04-17 NOTE — Progress Notes (Signed)
Orthopaedic Trauma Progress Note  SUBJECTIVE: Doing well. Reports minimal pain about operative site. No chest pain. No SOB. No nausea/vomiting. No other complaints.   OBJECTIVE:  Vitals:   04/16/21 2031 04/17/21 0530  BP: (!) 141/77 (!) 158/88  Pulse: 87 91  Resp: 18 19  Temp: 98 F (36.7 C) 98.1 F (36.7 C)  SpO2: 100% 100%    General: Sitting up in bed, NAD Respiratory: No increased work of breathing.  RLE: Dressing CDI. No significant tenderness with palpation. Has some decreased sensation with light touch over the foot but sensation intact over anterior tibia, knee, thigh.   IMAGING: Stable post op imaging.   LABS:  Results for orders placed or performed during the hospital encounter of 04/14/21 (from the past 24 hour(s))  Glucose, capillary     Status: Abnormal   Collection Time: 04/16/21  8:06 AM  Result Value Ref Range   Glucose-Capillary 142 (H) 70 - 99 mg/dL  MRSA Next Gen by PCR, Nasal     Status: None   Collection Time: 04/16/21 11:09 AM   Specimen: Nasal Mucosa; Nasal Swab  Result Value Ref Range   MRSA by PCR Next Gen NOT DETECTED NOT DETECTED  Glucose, capillary     Status: Abnormal   Collection Time: 04/16/21 11:42 AM  Result Value Ref Range   Glucose-Capillary 143 (H) 70 - 99 mg/dL  Glucose, capillary     Status: Abnormal   Collection Time: 04/16/21  2:24 PM  Result Value Ref Range   Glucose-Capillary 122 (H) 70 - 99 mg/dL  Glucose, capillary     Status: Abnormal   Collection Time: 04/16/21  4:38 PM  Result Value Ref Range   Glucose-Capillary 155 (H) 70 - 99 mg/dL  Glucose, capillary     Status: Abnormal   Collection Time: 04/16/21  8:26 PM  Result Value Ref Range   Glucose-Capillary 159 (H) 70 - 99 mg/dL  Ammonia     Status: None   Collection Time: 04/17/21  4:15 AM  Result Value Ref Range   Ammonia 35 9 - 35 umol/L  CBC with Differential/Platelet     Status: Abnormal   Collection Time: 04/17/21  4:15 AM  Result Value Ref Range   WBC 13.9 (H)  4.0 - 10.5 K/uL   RBC 2.96 (L) 3.87 - 5.11 MIL/uL   Hemoglobin 9.5 (L) 12.0 - 15.0 g/dL   HCT 30.2 (L) 36.0 - 46.0 %   MCV 102.0 (H) 80.0 - 100.0 fL   MCH 32.1 26.0 - 34.0 pg   MCHC 31.5 30.0 - 36.0 g/dL   RDW 13.8 11.5 - 15.5 %   Platelets 366 150 - 400 K/uL   nRBC 0.0 0.0 - 0.2 %   Neutrophils Relative % 78 %   Neutro Abs 10.9 (H) 1.7 - 7.7 K/uL   Lymphocytes Relative 16 %   Lymphs Abs 2.2 0.7 - 4.0 K/uL   Monocytes Relative 4 %   Monocytes Absolute 0.6 0.1 - 1.0 K/uL   Eosinophils Relative 1 %   Eosinophils Absolute 0.1 0.0 - 0.5 K/uL   Basophils Relative 0 %   Basophils Absolute 0.0 0.0 - 0.1 K/uL   Immature Granulocytes 1 %   Abs Immature Granulocytes 0.09 (H) 0.00 - 0.07 K/uL  Basic metabolic panel     Status: Abnormal   Collection Time: 04/17/21  4:15 AM  Result Value Ref Range   Sodium 136 135 - 145 mmol/L   Potassium 3.9 3.5 - 5.1  mmol/L   Chloride 107 98 - 111 mmol/L   CO2 18 (L) 22 - 32 mmol/L   Glucose, Bld 147 (H) 70 - 99 mg/dL   BUN 12 8 - 23 mg/dL   Creatinine, Ser 1.01 (H) 0.44 - 1.00 mg/dL   Calcium 9.3 8.9 - 10.3 mg/dL   GFR, Estimated 60 (L) >60 mL/min   Anion gap 11 5 - 15  Glucose, capillary     Status: Abnormal   Collection Time: 04/17/21  7:54 AM  Result Value Ref Range   Glucose-Capillary 155 (H) 70 - 99 mg/dL   *Note: Due to a large number of results and/or encounters for the requested time period, some results have not been displayed. A complete set of results can be found in Results Review.    ASSESSMENT: [name] is a 70 y.o. female, 1 Day Post-Op s/p ORIF RIGHT DISTAL FEMUR FRACTURE  CV/Blood loss: Acute blood loss anemia, Hgb 9.5 this AM. Hemodynamically stable  PLAN: Weightbearing: WBAT RLE ROM: Unrestricted knee motion Incisional and dressing care: Plan to remove tomorrow and leave open to air Showering: Hold off until dressings removed Orthopedic device(s): None  Pain management:  1. Robaxin 500 mg q 6 hours PRN 2.  Tramadol 50-`00 mg q 6 hours PRN 3. Neurontin 200 mg BID 4. Morphine 2 mg q 3 hours PRN VTE prophylaxis: Lovenox, SCDs ID: Clindamycin post op Foley/Lines:  No foley, KVO IVFs Impediments to Fracture Healing: Vit d level pending Dispo: PT/OT eval today. Dispo pending. Plan to remove dressings tomorrow Follow - up plan: 2 weeks after d/c for repeat x-rays and wound check  Contact information:  Katha Hamming MD, Patrecia Pace PA-C. After hours and holidays please check [email or web address] for group call information for [facility]   Dyshon Philbin A. Ricci Barker, PA-C [phone] (office) [email or web address]

## 2021-04-17 NOTE — Evaluation (Signed)
Physical Therapy Evaluation Patient Details Name: [name] MRN: [id number] DOB: 1950-11-09 Today's Date: 04/17/2021   History of Present Illness  [name] is a 70 y.o. female presenting to ED 7/9 with substernal chest pressure, recurrent falls, decreased p.o. intake, nausea and SOB. Imaging (+) R supracondylar distal femur fracture s/p ORIF 7/11 with WBAT. PMHx significant for DMII, CKD stage III-IV, CAD, HTN, HLD, hypothyroidism, depression/anxiety and chronic back pain s/p spinal cord stimulator.  Clinical Impression  Pt presented with confusion. Disoriented with time (I.e,. month and year). Pt's husband and daughter present during PT session today. Pt required mod A +2 on bed mobility to EOB, pain limiting. Pt sits with L lateral trunk lean at EOB, which daughter states was premorbid at times. Pt demonstrated decreased strength in bilateral UE and LLE. RLE unable to test due to pain. Recommend continues PT to address deficits in strength, sitting balance, and progression to standing. Pt educated on performing quad sets and ankle pumps daily to improve strength.     Follow Up Recommendations SNF    Equipment Recommendations  None recommended by PT at this time. TBA.   Recommendations for Other Services       Precautions / Restrictions Precautions Precautions: Fall Restrictions: Unrestricted knee ROM Weight Bearing Restrictions: Yes RLE Weight Bearing: Weight bearing as tolerated      Mobility  Bed Mobility Overal bed mobility: Needs Assistance Bed Mobility: Supine to Sit;Sit to Supine Rolling: Max assist;+2 for physical assistance;+2 for safety/equipment Sidelying to sit: Max assist;+2 for physical assistance;+2 for safety/equipment Supine to sit: +2 for safety/equipment;+2 for physical assistance;Mod assist Sit to supine: +2 for physical assistance;+2 for safety/equipment;Mod assist   General bed mobility comments: +2 assistance for safety, as pt mentioned fear of  falling. +2 physical assitance for RLE during bed mobility. Pt reported pain in seated position.    Transfers NT             Ambulation/Gait    NT            Stairs   NT          Wheelchair Mobility    Modified Rankin (Stroke Patients Only)       Balance Overall balance assessment: Needs assistance Sitting-balance support: Single extremity supported Sitting balance-Leahy Scale: Poor Sitting balance - Comments: Pt demonstrates lateral trunk lean to L in seated position. Maintains balance using LUE. Verbal and tactile cueing provided to shift weight midline. Pt reports pain when seated in upright position. Postural control: Left lateral lean     Standing balance comment: Standing balance unable to be assessed. Pain limiting.                             Pertinent Vitals/Pain Vitals stable throughout Pain Assessment: Faces Faces Pain Scale: Hurts whole lot Pain Location: RLE and R shoulder Pain Descriptors / Indicators: Sharp;Grimacing;Discomfort;Guarding;Moaning Pain Intervention(s): Limited activity within patient's tolerance;Monitored during session;Repositioned    Home Living Family/patient expects to be discharged to:: Skilled nursing facility Living Arrangements: Spouse/significant other;Children   Type of Home: House Home Access: Stairs to enter Entrance Stairs-Rails: Psychiatric nurse of Steps: 3 Home Layout: One level Home Equipment: Environmental consultant - 2 wheels;Walker - 4 wheels;Bedside commode;Shower seat;Toilet riser;Hand held shower head      Prior Function Level of Independence: Needs assistance   Gait / Transfers Assistance Needed: Pt's spouse states the pt has been using wall and furniture for  household mobility for a "couple of months".  ADL's / Homemaking Assistance Needed: Pt previously was independent with ADLs (timeframe with independence unknown). Pt's husband reports assisting with ADLs (i.e., feeding and  bathing) after fall, as well as baseline strength and coordination deficits in UEs  Comments: Information obtained from med chart as patient is a poor historian with Hx of cog deficits at baseline.     Hand Dominance   Dominant Hand: Right    Extremity/Trunk Assessment   Upper Extremity Assessment Upper Extremity Assessment: RUE deficits/detail RUE Deficits / Details: Limited AROM at shoulder. Patient unable to recall if this is acute or chronic. Can functionally reach to grab bar across body on R but difficulty maintaining grasp.    Lower Extremity Assessment Lower Extremity Assessment: Generalized weakness (Pt demonstrated decreased strength in LLE with knee flexion. Unable to test RLE due to pain.)    Cervical / Trunk Assessment Cervical / Trunk Assessment: Other exceptions Cervical / Trunk Exceptions: Large body habitus.  Communication   Communication: No difficulties  Cognition Arousal/Alertness: Awake/alert Behavior During Therapy: WFL for tasks assessed/performed Overall Cognitive Status: History of cognitive impairments - at baseline                                 General Comments: A&O x3. Pt had difficulty recalling current month and year.      General Comments      Exercises General Exercises - Lower Extremity Quad Sets: AROM;Both;Other (comment) (Pt performed one quad set bilaterally for educational purposes.)   Assessment/Plan    PT Assessment Patient needs continued PT services  PT Problem List Decreased strength;Decreased range of motion;Decreased mobility;Decreased activity tolerance;Decreased coordination;Decreased cognition       PT Treatment Interventions Therapeutic activities;Functional mobility training;Balance training    PT Goals (Current goals can be found in the Care Plan section)  Acute Rehab PT Goals Patient Stated Goal: To decrease pain Time For Goal Achievement: 05/01/21 Potential to Achieve Goals: Good    Frequency  Min 3X/week   Barriers to discharge        Co-evaluation               AM-PAC PT "6 Clicks" Mobility  Outcome Measure Help needed turning from your back to your side while in a flat bed without using bedrails?: Total Help needed moving from lying on your back to sitting on the side of a flat bed without using bedrails?: Total Help needed moving to and from a bed to a chair (including a wheelchair)?: Total Help needed standing up from a chair using your arms (e.g., wheelchair or bedside chair)?: Total Help needed to walk in hospital room?: Total Help needed climbing 3-5 steps with a railing? : Total 6 Click Score: 6    End of Session   Activity Tolerance: Patient limited by pain Patient left: in bed;with nursing/sitter in room;with family/visitor present;with bed alarm set Nurse Communication: Mobility status PT Visit Diagnosis: Repeated falls (R29.6);Muscle weakness (generalized) (M62.81);History of falling (Z91.81);Other abnormalities of gait and mobility (R26.89)    Time: 1130-1156 PT Time Calculation (min) (ACUTE ONLY): 26 min   Charges:   PT Evaluation $PT Eval Moderate Complexity: 1 Mod PT Treatments $Therapeutic Activity: 8-22 mins        Louie Casa, SPT Acute Rehab: [phone]   Domingo Dimes 04/17/2021, 12:44 PM

## 2021-04-17 NOTE — Progress Notes (Signed)
Husband requesting a PCA pump for pt., so she can better control her pain. MD made aware, and said he will come to bedside.

## 2021-04-17 NOTE — Progress Notes (Signed)
Initial Nutrition Assessment  DOCUMENTATION CODES:   Morbid obesity  INTERVENTION:  -Glucerna Shake po TID, each supplement provides 220 kcal and 10 grams of protein -MVI with minerals daily  NUTRITION DIAGNOSIS:   Inadequate oral intake related to poor appetite as evidenced by per patient/family report.  GOAL:   Patient will meet greater than or equal to 90% of their needs  MONITOR:   PO intake, Supplement acceptance, Labs, Weight trends, I & O's  REASON FOR ASSESSMENT:   Consult Assessment of nutrition requirement/status  ASSESSMENT:   Pt with PMH significant for HTN, HLD, CAD, type 2 DM, hypothyroidism, cervical radiculopathy, chronic back pain, anxiety/depression, and GERD admitted with weakness and R femur fracture, now s/p open reduction internal fixation of R femur fracture on 7/11.  Pt reports generalized fatigue/weakness and poor po intake over the last couple of weeks with an associated ~10-15lb weight loss. Per MD, this is likely 2/2 recent diagnosis of acute diverticulitis. Today, pt denies N/V and reports minimal pain. Per MD, pt is hemodynamically stable.   No PO intake documented. Discussed pt & pt's intake with RN.   Per weight readings, pt weighed 129.3 kg on 01/09/21 and 135.6 kg on 10/11/20, and pt now weighs 113.4 kg. This would indicate a 14% and 20% weight loss over 3 and 6 months, respectively (both clinically significant for time frame).   UOP:1134m x24 hours  Medications: colace, SSI, lantus, protonix, drisdol Labs: Hgb 8.3 (L), Vitamin D 8.74 (L) CBGs 134-171 over 24 hours  NUTRITION - FOCUSED PHYSICAL EXAM: Deferred; will attempt at follow-up.   Diet Order:   Diet Order             DIET - DYS 1 Room service appropriate? Yes; Fluid consistency: Thin  Diet effective now                   EDUCATION NEEDS:   No education needs have been identified at this time  Skin:  Skin Assessment: Skin Integrity Issues: Skin Integrity Issues::  Incisions Incisions: R leg  Last BM:  7/11  Height:   Ht Readings from Last 1 Encounters:  04/16/21 '5\' 5"'$  (1.651 m)    Weight:   Wt Readings from Last 1 Encounters:  04/18/21 113.4 kg    Ideal Body Weight:  56.82 kg  BMI:  Body mass index is 41.6 kg/m.  Estimated Nutritional Needs:   Kcal:  2000-2200  Protein:  125-140g  Fluid:  >2L    ALarkin Ina MS, RD, LDN (she/her/hers) RD pager number and weekend/on-call pager number located in [place]

## 2021-04-17 NOTE — Progress Notes (Signed)
I went to get patients vitals and sugar and the patients family member was upset because I had came in to get this patients vitals and sugar after she had just got her tray. I stated we do not know when the patients trays come and we have been busy and have gotten behind on sugars and vitals. I am not this patients Tech I was helping out a fellow coworker and getting her sugar and vitals and was rudely talked to and that doesn't bother me. He told me he was going to report me for doing patient care and that bothered me. The sugar was suppose to be done before she ate anyways.

## 2021-04-17 NOTE — Evaluation (Signed)
Occupational Therapy Evaluation Patient Details Name: [name] MRN: [id number] DOB: 07-05-1951 Today's Date: 04/17/2021    History of Present Illness [name] is a 70 y.o. female presenting to ED 7/9 with substernal chest pressure, recurrent falls, decreased p.o. intake, nausea and SOB. Imaging (+) R supracondylar distal femur fracture s/p ORIF 7/11 with WBAT. PMHx significant for DMII, CKD stage III-IV, CAD, HTN, HLD, hypothyroidism, depression/anxiety and chronic back pain s/p spinal cord stimulator.   Clinical Impression   PTA patient was living with her spouse in a private residence and was requiring assist for all ADLs, functional mobility and transfers from husband. Patient also required assist for self-feeding with husband reporting patient is unable to hold onto utensils secondary to decreased strength and coordination. Patient currently requiring Max to Total A +2 for supine <> EOB and rolling in supine. Session greatly limited by pain in RLE with patient in tears and unable to fully progress to EOB before requesting to return to supine. Patient also limited by deficits listed below including generalized weakness, decreased activity tolerance, generalized debility and decreased cognition and would benefit from continued acute OT services in prep for safe d/c to next level of care with recommendation for SNF rehab. Husband expressed desire to bring patient home if she progresses enough to only require 1 person assist. OT will continue to follow acutely.     Follow Up Recommendations  SNF;Supervision/Assistance - 24 hour    Equipment Recommendations  Other (comment) (Defer to next level of care)    Recommendations for Other Services       Precautions / Restrictions Precautions Precautions: Fall Restrictions Weight Bearing Restrictions: Yes RLE Weight Bearing: Weight bearing as tolerated      Mobility Bed Mobility Overal bed mobility: Needs Assistance Bed Mobility:  Rolling;Sidelying to Sit;Sit to Supine Rolling: Max assist;+2 for physical assistance;+2 for safety/equipment Sidelying to sit: Max assist;+2 for physical assistance;+2 for safety/equipment   Sit to supine: Total assist;+2 for physical assistance;+2 for safety/equipment   General bed mobility comments: Patient required Max A at BLE and trunk with HOB elevated to advance to EOB. Unable to tolerate anterior scoots toward EOB even with external assist 2/2 pain.    Transfers Overall transfer level: Needs assistance   Transfers: Lateral/Scoot Transfers           General transfer comment: Total A +2 with use of chuck pad for anterior/posterior scoot toward HOB.    Balance Overall balance assessment: Needs assistance Sitting-balance support: Bilateral upper extremity supported (LLE supported on ground) Sitting balance-Leahy Scale: Fair Sitting balance - Comments: Requires BUE support on bed surface.       Standing balance comment: Unable                           ADL either performed or assessed with clinical judgement   ADL Overall ADL's : Needs assistance/impaired     Grooming: Set up;Bed level Grooming Details (indicate cue type and reason): Able to wash face with set-up assist. Noted difficulty using R arm to wash face. Patient states this is 2/2 placement of IV.             Lower Body Dressing: Total assistance;Bed level Lower Body Dressing Details (indicate cue type and reason): Total A to don footwear in supine.                     Vision   Vision Assessment?: No  apparent visual deficits Additional Comments: Able to correctly identify time on wall clock.     Perception     Praxis      Pertinent Vitals/Pain Pain Assessment: Faces Faces Pain Scale: Hurts whole lot Pain Location: RLE Pain Descriptors / Indicators: Sharp;Crying;Grimacing;Guarding;Moaning Pain Intervention(s): Limited activity within patient's tolerance;Monitored during  session;Repositioned;RN gave pain meds during session     Hand Dominance Right   Extremity/Trunk Assessment Upper Extremity Assessment Upper Extremity Assessment: Generalized weakness;RUE deficits/detail RUE Deficits / Details: Limited AROM at shoulder. Patient unable to recall if this is acute or chronic. Can functionally reach to grab bar across body on R but difficulty maintaining grasp.   Lower Extremity Assessment Lower Extremity Assessment: Defer to PT evaluation   Cervical / Trunk Assessment Cervical / Trunk Assessment: Other exceptions Cervical / Trunk Exceptions: Large body habitus.   Communication Communication Communication: No difficulties   Cognition Arousal/Alertness: Awake/alert Behavior During Therapy: WFL for tasks assessed/performed Overall Cognitive Status: History of cognitive impairments - at baseline                                 General Comments: Patient A&O to person and place only.   General Comments       Exercises     Shoulder Instructions      Home Living Family/patient expects to be discharged to:: Skilled nursing facility Living Arrangements: Spouse/significant other;Children   Type of Home: House Home Access: Stairs to enter Entrance Stairs-Number of Steps: 3 Entrance Stairs-Rails: Right;Left Home Layout: One level     Bathroom Shower/Tub: Walk-in shower;Tub only   Bathroom Toilet: Handicapped height     Home Equipment: Walker - 2 wheels;Walker - 4 wheels;Bedside commode;Shower seat;Toilet riser;Hand held shower head          Prior Functioning/Environment Level of Independence: Needs assistance  Gait / Transfers Assistance Needed: Per med chart patient uses RW vs rollator with assist from family for household mobility ADL's / Homemaking Assistance Needed: Husband assists with all ADLs including self-feeding stating patient is unable to hold onto utensils. Reports deficits in BUE at baseline including decreased  strength and decreased coordination.   Comments: Information obtained from med chart as patient is a poor historian with Hx of cog deficits at baseline.        OT Problem List: Decreased strength;Decreased range of motion;Decreased activity tolerance;Impaired balance (sitting and/or standing);Decreased cognition;Decreased safety awareness;Decreased knowledge of use of DME or AE;Impaired UE functional use;Pain;Increased edema      OT Treatment/Interventions: Self-care/ADL training;Therapeutic exercise;Energy conservation;DME and/or AE instruction;Therapeutic activities;Cognitive remediation/compensation;Patient/family education;Balance training    OT Goals(Current goals can be found in the care plan section) Acute Rehab OT Goals Patient Stated Goal: To decrease pain. OT Goal Formulation: With family Time For Goal Achievement: 05/01/21 Potential to Achieve Goals: Good ADL Goals Pt Will Perform Eating: with min assist;with adaptive utensils;sitting Pt Will Perform Grooming: with set-up;sitting Pt Will Perform Upper Body Dressing: with set-up;sitting Pt Will Transfer to Toilet: with mod assist;bedside commode Pt Will Perform Toileting - Clothing Manipulation and hygiene: with mod assist;sit to/from stand Pt/caregiver will Perform Home Exercise Program: Increased ROM;Increased strength;Both right and left upper extremity  OT Frequency: Min 2X/week   Barriers to D/C:            Co-evaluation              AM-PAC OT "6 Clicks" Daily Activity     Outcome  Measure Help from another person eating meals?: A Lot Help from another person taking care of personal grooming?: A Little Help from another person toileting, which includes using toliet, bedpan, or urinal?: Total Help from another person bathing (including washing, rinsing, drying)?: Total Help from another person to put on and taking off regular upper body clothing?: A Lot Help from another person to put on and taking off  regular lower body clothing?: Total 6 Click Score: 10   End of Session Nurse Communication: Mobility status;Other (comment) (Response to treatment)  Activity Tolerance: Patient limited by pain Patient left: in bed;with bed alarm set;with call bell/phone within reach  OT Visit Diagnosis: Unsteadiness on feet (R26.81);Repeated falls (R29.6);Muscle weakness (generalized) (M62.81);Pain Pain - Right/Left: Right Pain - part of body: Leg                Time: 0921-0952 OT Time Calculation (min): 31 min Charges:  OT General Charges $OT Visit: 1 Visit OT Evaluation $OT Eval Moderate Complexity: 1 Mod OT Treatments $Therapeutic Activity: 8-22 mins  [name] H. OTR/L Supplemental OT, Department of rehab services [phone]  Sherry Driscoll R H. 04/17/2021, 10:56 AM

## 2021-04-17 NOTE — Telephone Encounter (Signed)
[facility] [employer] Phone: [phone] Sent paperwork over a month ago, re: orthotics for both feet Paperwork has been faxed twice, still have not received it. She needs a signed prescription and most recent chart notes along with the paperwork faxed to [phone].  If we don't have the paperwork, please call [facility] and let her know to re-send

## 2021-04-17 NOTE — [place] FL2 (Signed)
[place] MEDICAID FL2 LEVEL OF CARE SCREENING TOOL     IDENTIFICATION  Patient Name: [name] Birthdate: July 14, 1951 Sex: female Admission Date (Current Location): 04/14/2021  [facility] and [place] Number:  Herbalist and Address:  The . 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) 02/02/2021   Acquired hypothyroidism 11/24/2020   Chronic pain 11/24/2020   Severe sepsis ([place]) 11/09/2020   Hyperlipidemia associated with type 2 diabetes mellitus ([place]) 99991111   Acute metabolic encephalopathy 99991111   Acute lower UTI 11/09/2020   Recurrent falls 10/19/2020   Gait disorder 10/11/2020   Fall 10/11/2020   Low grade fever 02/04/2020   Chest pain 01/26/2020   Lumbar post-laminectomy syndrome 10/05/2019   Spinal cord stimulator status 10/05/2019   Body mass index (BMI) 40.0-44.9, adult ([place]) 10/05/2019   Rectal bleeding 09/01/2019   History of peptic ulcer disease 09/01/2019   Screening for viral disease 09/01/2019   Insulin dependent type 2 diabetes mellitus ([place]) 09/01/2019   Pyelonephritis 06/05/2019   Recurrent UTI 06/05/2019   Urinary incontinence 06/05/2019   Nausea & vomiting 02/08/2019   Abdominal pain 02/08/2019   Constipation 02/08/2019   UTI (urinary tract infection) 02/08/2019    LUQ pain 01/21/2019   Cervical radiculopathy at C6 01/28/2018   Left lateral epicondylitis 01/23/2018   Obesity 01/23/2018   Weakness 05/01/2017   Hyperkalemia 05/01/2017   Acute kidney injury superimposed on CKD ([place]) 05/01/2017   Essential (primary) hypertension 05/01/2017   Sepsis ([place]) 05/01/2017   Dysuria 04/23/2017   Right leg swelling 12/27/2016   Leg pain, lateral, right 11/05/2016   Radiculopathy 04/03/2016   Hypersomnolence 11/23/2015   Right sided abdominal pain 07/25/2015   Malaise and fatigue 11/05/2014   Rotator cuff syndrome 07/26/2014   Fracture of fifth metacarpal bone of right hand 07/08/2014   Right cervical radiculopathy 06/27/2014   Chronic interstitial cystitis 05/23/2014   Osteoarthritis of CMC joint of thumb 04/11/2014   De Quervain's tenosynovitis, left 03/14/2014   CKD (chronic kidney disease), stage III ([place]) 03/03/2014   Lower back pain 03/03/2014   Ingrown nail 03/03/2014   Peripheral edema 02/23/2014   Lateral epicondylitis of right elbow 01/31/2014   Neck pain on left side 11/19/2013   Abdominal discomfort 10/12/2012   Right lumbar radiculopathy 05/11/2012   Peripheral neuropathy 05/11/2012   Vertigo 03/29/2012   Headache(784.0) 03/10/2012   Left lower quadrant abdominal pain 10/24/2011   Lumbar radiculopathy 10/24/2011   Hypothyroidism 08/19/2011   Right leg weakness 04/04/2011   Hematochezia 01/07/2011   Preventative health care 01/07/2011   CHOLELITHIASIS 05/04/2009   GERD 04/26/2009   Gastroparesis 04/26/2009   ULCER-GASTRIC 03/15/2009   DIVERTICULOSIS-COLON 03/15/2009   Cervical radiculopathy 10/28/2008   MENOPAUSAL DISORDER 06/29/2008   Pain in joint, shoulder region 10/12/2007   INSOMNIA-SLEEP DISORDER-UNSPEC 09/01/2007   Depression 09/01/2007  Macrocytic anemia 08/13/2007   COMMON MIGRAINE 08/13/2007   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   Diabetes ([place]) 05/06/2007   HLD (hyperlipidemia) 05/06/2007   ANXIETY 05/06/2007    Hypertension associated with diabetes ([place]) 05/06/2007   Coronary atherosclerosis 05/06/2007    Orientation RESPIRATION BLADDER Height & Weight     Self, Place  Normal External catheter Weight: 246 lb 0.2 oz (111.6 kg) Height:  '5\' 5"'$  (165.1 cm)  BEHAVIORAL SYMPTOMS/MOOD NEUROLOGICAL BOWEL NUTRITION STATUS      Continent Diet (See discharge summary)  AMBULATORY STATUS COMMUNICATION OF NEEDS Skin   Total Care Verbally Surgical wounds                       Personal Care Assistance Level of Assistance  Bathing, Feeding, Dressing Bathing Assistance: Maximum assistance Feeding assistance: Limited assistance Dressing Assistance: Maximum assistance     Functional Limitations Info  Sight, Hearing, Speech Sight Info: Adequate Hearing Info: Adequate Speech Info: Adequate    SPECIAL CARE FACTORS FREQUENCY  PT (By licensed PT), OT (By licensed OT)     PT Frequency: 5x week OT Frequency: 5x week            Contractures Contractures Info: Not present    Additional Factors Info  Code Status, Allergies, Insulin Sliding Scale Code Status Info: full Allergies Info: Ciprofloxacin, Hydrocodone, Hydrocortisone, Penicillins   Insulin Sliding Scale Info: Novolog, 0-15 units, 3x day with meals. See discharge summary.       Current Medications (04/17/2021):  This is the current hospital active medication list Current Facility-Administered Medications  Medication Dose Route Frequency Provider Last Rate Last Admin   albuterol (PROVENTIL) (2.5 MG/3ML) 0.083% nebulizer solution 2.5 mg  2.5 mg Nebulization Q6H PRN Patrecia Pace A, PA-C       amLODipine (NORVASC) tablet 10 mg  10 mg Oral Daily Shelly Coss, MD   10 mg at 04/17/21 0842   aspirin EC tablet 81 mg  81 mg Oral Daily Delray Alt, PA-C   81 mg at 04/17/21 0842   atorvastatin (LIPITOR) tablet 40 mg  40 mg Oral QHS Patrecia Pace A, PA-C   40 mg at 04/16/21 2100   Chlorhexidine Gluconate Cloth 2 % PADS 6 each  6 each  Topical Q0600 Delray Alt, PA-C   6 each at 04/17/21 0424   cloNIDine (CATAPRES) tablet 0.1 mg  0.1 mg Oral BID Delray Alt, PA-C   0.1 mg at 04/17/21 [id number]   docusate sodium (COLACE) capsule 100 mg  100 mg Oral BID Patrecia Pace A, PA-C   100 mg at 04/17/21 0841   enoxaparin (LOVENOX) injection 40 mg  40 mg Subcutaneous Q24H Patrecia Pace A, PA-C   40 mg at 04/17/21 0843   gabapentin (NEURONTIN) capsule 100 mg  100 mg Oral BID Shelly Coss, MD   100 mg at 04/17/21 1211   HYDROmorphone (DILAUDID) injection 1 mg  1 mg Intravenous Q4H PRN Shelly Coss, MD   1 mg at 04/17/21 1212   insulin aspart (novoLOG) injection 0-15 Units  0-15 Units Subcutaneous TID WC Patrecia Pace A, PA-C   3 Units at 04/17/21 1211   insulin glargine (LANTUS) injection 20 Units  20 Units Subcutaneous Daily Delray Alt, PA-C   20 Units at 04/17/21 0843   isosorbide mononitrate (IMDUR) 24 hr tablet 60 mg  60 mg Oral Daily Patrecia Pace A, PA-C   60 mg at 04/17/21 0842   Loteprednol Etabonate 0.38 % GEL  1 drop  1 drop Both Eyes TID Delray Alt, PA-C       methocarbamol (ROBAXIN) tablet 500 mg  500 mg Oral Q6H PRN Delray Alt, PA-C   500 mg at 04/16/21 1543   metoprolol succinate (TOPROL-XL) 24 hr tablet 50 mg  50 mg Oral Daily Patrecia Pace A, PA-C   50 mg at 04/17/21 0842   ondansetron (ZOFRAN) tablet 4 mg  4 mg Oral Q6H PRN Delray Alt, PA-C       Or   ondansetron [facility]) injection 4 mg  4 mg Intravenous Q6H PRN Patrecia Pace A, PA-C   4 mg at 04/16/21 1626   pantoprazole (PROTONIX) EC tablet 40 mg  40 mg Oral BID Patrecia Pace A, PA-C   40 mg at 04/17/21 0841   polyethylene glycol (MIRALAX / GLYCOLAX) packet 17 g  17 g Oral Daily PRN Patrecia Pace A, PA-C       sodium chloride flush (NS) 0.9 % injection 3 mL  3 mL Intravenous Q12H Patrecia Pace A, PA-C   3 mL at 04/16/21 2103   traMADol (ULTRAM) tablet 50-100 mg  50-100 mg Oral Q6H PRN Delray Alt, PA-C   100 mg at 04/17/21 1101      Discharge Medications: Please see discharge summary for a list of discharge medications.  Relevant Imaging Results:  Relevant Lab Results:   Additional Information SSN: [id number].  Pt is vaccinated for covid, but not boosted.  Joanne Chars, LCSW

## 2021-04-17 NOTE — Telephone Encounter (Signed)
I certainly dont have any paperwork

## 2021-04-17 NOTE — Progress Notes (Signed)
04/17/21 1227  OT Visit Information  Last OT Received On 04/17/21  Assistance Needed +2  History of Present Illness [name] is a 70 y.o. female presenting to ED 7/9 with substernal chest pressure, recurrent falls, decreased p.o. intake, nausea and SOB. Imaging (+) R supracondylar distal femur fracture s/p ORIF 7/11 with WBAT. PMHx significant for DMII, CKD stage III-IV, CAD, HTN, HLD, hypothyroidism, depression/anxiety and chronic back pain s/p spinal cord stimulator.  Precautions  Precautions Fall  Pain Assessment  Pain Assessment Faces  Faces Pain Scale 8  Pain Location RLE and R shoulder  Pain Descriptors / Indicators Sharp;Grimacing;Discomfort;Guarding;Moaning  Pain Intervention(s) Limited activity within patient's tolerance  Cognition  Arousal/Alertness Awake/alert  Behavior During Therapy WFL for tasks assessed/performed  Overall Cognitive Status History of cognitive impairments - at baseline  General Comments A&O x3. Pt had difficulty recalling current month and year.  Upper Extremity Assessment  Upper Extremity Assessment RUE deficits/detail  RUE Deficits / Details AROM limited at shoulder. WFL at elbow, wrist and digits with MMT 4-/5. PROM painful beyond 80 degrees shoulder flexion/abduction. No visible deformity noted.  RUE Unable to fully assess due to pain  RUE Coordination decreased fine motor;decreased gross motor ([place] deficits at baseline)  Lower Extremity Assessment  Lower Extremity Assessment Defer to PT evaluation  General Comments  General comments (skin integrity, edema, etc.) OT returned to room per RN request as husband expresed concern about limited AROM in R shoulder. Husband insistent that injury occured to R shoudler at time of OT evaluation. Time spent educating husband on noted R shoulder limited AROM at start of OT evaluation (although at that time patient unable to state if deficits were acute or chronic given Hx of cognitive deficits). Of note,  patient with recurrent falls leading to this hospital admission. OT also explained in great detail technique used for supine to EOB and need for +2 assist for return to supine with use of chuck pad and HOB in reverse trendelenburg. RN in room at time of return to supine. Husband seemed to understand this therapists explanation but expressed continued disdain and anger. This Probation officer paged MD about limited AROM at R shoulder. MD to order xray. RN updated on a.m. events. OT will continue to follow acutely.  OT Assessment/Plan  OT Plan Discharge plan remains appropriate;Frequency remains appropriate  OT Visit Diagnosis Unsteadiness on feet (R26.81);Repeated falls (R29.6);Muscle weakness (generalized) (M62.81);Pain  Pain - Right/Left Right  Pain - part of body Leg;Arm (RUE with PROM (shoulder flexion and abduction only))  OT Frequency (ACUTE ONLY) Min 2X/week  Follow Up Recommendations SNF;Supervision/Assistance - 24 hour  OT Equipment Other (comment) (Defer to next level of care)  AM-PAC OT "6 Clicks" Daily Activity Outcome Measure (Version 2)  Help from another person eating meals? 2  Help from another person taking care of personal grooming? 3  Help from another person toileting, which includes using toliet, bedpan, or urinal? 1  Help from another person bathing (including washing, rinsing, drying)? 1  Help from another person to put on and taking off regular upper body clothing? 2  Help from another person to put on and taking off regular lower body clothing? 1  6 Click Score 10  OT Goal Progression  Progress towards OT goals Progressing toward goals  Acute Rehab OT Goals  Patient Stated Goal To decrease pain  OT Goal Formulation With family  Time For Goal Achievement 05/01/21  Potential to Achieve Goals Good  ADL Goals  Pt Will  Perform Eating with min assist;with adaptive utensils;sitting  Pt Will Perform Grooming with set-up;sitting  Pt Will Perform Upper Body Dressing with  set-up;sitting  Pt Will Transfer to Toilet with mod assist;bedside commode  Pt Will Perform Toileting - Clothing Manipulation and hygiene with mod assist;sit to/from stand  Pt/caregiver will Perform Home Exercise Program Increased ROM;Increased strength;Both right and left upper extremity

## 2021-04-17 NOTE — TOC Initial Note (Signed)
Transition of Care [facility]) - Initial/Assessment Note    Patient Details  Name: [name] MRN: [id number] Date of Birth: 1950-12-18  [facility] CM/SW Contact:    Joanne Chars, LCSW Phone Number: 04/17/2021, 3:21 PM  Clinical Narrative:  CSW met with pt, husband and daughter in room to discuss discharge recommendation for SNF.  Permission given to speak with husband [name] and daughter Donella Stade.  Pt somewhat engaged in conversation but most information from husband.  Pt and husband are agreeable to SNF recommendation, choice document given.  Permission given to send out referral in hub.  Pt has been vaccinated for covid with one booster, discussed possibility of second booster and husband would like to speak with MD about this.                 Expected Discharge Plan: Skilled Nursing Facility Barriers to Discharge: Continued Medical Work up, SNF Pending bed offer   Patient Goals and CMS Choice Patient states their goals for this hospitalization and ongoing recovery are:: walking CMS Medicare.gov Compare Post Acute Care list provided to:: Patient Represenative (must comment) Choice offered to / list presented to : Spouse  Expected Discharge Plan and Services Expected Discharge Plan: [place] In-house Referral: Clinical Social Work   Post Acute Care Choice: [place] Living arrangements for the past 2 months: [place]                                      Prior Living Arrangements/Services Living arrangements for the past 2 months: Single Family Home Lives with:: Adult Children, Spouse Patient language and need for interpreter reviewed:: Yes Do you feel safe going back to the place where you live?: Yes      Need for Family Participation in Patient Care: Yes (Comment) Care giver support system in place?: Yes (comment) Current home services: Other (comment) (none) Criminal Activity/Legal Involvement Pertinent to  Current Situation/Hospitalization: No - Comment as needed  Activities of Daily Living      Permission Sought/Granted Permission sought to share information with : Family Supports Permission granted to share information with : Yes, Verbal Permission Granted     Permission granted to share info w AGENCY: husband [name], daughter Teacher, music        Emotional Assessment Appearance:: Appears stated age Attitude/Demeanor/Rapport: Engaged Affect (typically observed): Unable to Assess Orientation: : Oriented to Self, Oriented to Place Alcohol / Substance Use: Not Applicable Psych Involvement: No (comment)  Admission diagnosis:  Weakness [R53.1] Fall [W19.XXXA] Frequent falls [R29.6] Iatrogenic thyrotoxicosis [E05.80] Altered mental status, unspecified altered mental status type [R41.82] Patient Active Problem List   Diagnosis Date Noted   Weight loss 04/15/2021   Leukocytosis 04/15/2021   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   Acute diverticulitis 03/21/2021   Degenerative disc disease, cervical 02/08/2021   Type 2 diabetes mellitus ([place]) 02/02/2021   Acquired hypothyroidism 11/24/2020   Chronic pain 11/24/2020   Severe sepsis ([place]) 11/09/2020   Hyperlipidemia associated with type 2 diabetes mellitus ([place]) 60/63/0160   Acute metabolic encephalopathy 10/93/2355   Acute lower UTI 11/09/2020   Recurrent falls 10/19/2020   Gait disorder 10/11/2020   Fall 10/11/2020   Low grade fever 02/04/2020   Chest pain 01/26/2020   Lumbar post-laminectomy syndrome 10/05/2019   Spinal cord stimulator status 10/05/2019   Body mass index (BMI) 40.0-44.9, adult ([place]) 10/05/2019   Rectal bleeding 09/01/2019  History of peptic ulcer disease 09/01/2019   Screening for viral disease 09/01/2019   Insulin dependent type 2 diabetes mellitus ([place]) 09/01/2019   Pyelonephritis 06/05/2019   Recurrent UTI 06/05/2019   Urinary incontinence 06/05/2019   Nausea & vomiting 02/08/2019   Abdominal pain  02/08/2019   Constipation 02/08/2019   UTI (urinary tract infection) 02/08/2019   LUQ pain 01/21/2019   Cervical radiculopathy at C6 01/28/2018   Left lateral epicondylitis 01/23/2018   Obesity 01/23/2018   Weakness 05/01/2017   Hyperkalemia 05/01/2017   Acute kidney injury superimposed on CKD ([place]) 05/01/2017   Essential (primary) hypertension 05/01/2017   Sepsis ([place]) 05/01/2017   Dysuria 04/23/2017   Right leg swelling 12/27/2016   Leg pain, lateral, right 11/05/2016   Radiculopathy 04/03/2016   Hypersomnolence 11/23/2015   Right sided abdominal pain 07/25/2015   Malaise and fatigue 11/05/2014   Rotator cuff syndrome 07/26/2014   Fracture of fifth metacarpal bone of right hand 07/08/2014   Right cervical radiculopathy 06/27/2014   Chronic interstitial cystitis 05/23/2014   Osteoarthritis of CMC joint of thumb 04/11/2014   De Quervain's tenosynovitis, left 03/14/2014   CKD (chronic kidney disease), stage III ([place]) 03/03/2014   Lower back pain 03/03/2014   Ingrown nail 03/03/2014   Peripheral edema 02/23/2014   Lateral epicondylitis of right elbow 01/31/2014   Neck pain on left side 11/19/2013   Abdominal discomfort 10/12/2012   Right lumbar radiculopathy 05/11/2012   Peripheral neuropathy 05/11/2012   Vertigo 03/29/2012   Headache(784.0) 03/10/2012   Left lower quadrant abdominal pain 10/24/2011   Lumbar radiculopathy 10/24/2011   Hypothyroidism 08/19/2011   Right leg weakness 04/04/2011   Hematochezia 01/07/2011   Preventative health care 01/07/2011   CHOLELITHIASIS 05/04/2009   GERD 04/26/2009   Gastroparesis 04/26/2009   ULCER-GASTRIC 03/15/2009   DIVERTICULOSIS-COLON 03/15/2009   Cervical radiculopathy 10/28/2008   MENOPAUSAL DISORDER 06/29/2008   Pain in joint, shoulder region 10/12/2007   INSOMNIA-SLEEP DISORDER-UNSPEC 09/01/2007   Depression 09/01/2007   Macrocytic anemia 08/13/2007   COMMON MIGRAINE 08/13/2007   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    Diabetes ([place]) 05/06/2007   HLD (hyperlipidemia) 05/06/2007   ANXIETY 05/06/2007   Hypertension associated with diabetes ([place]) 05/06/2007   Coronary atherosclerosis 05/06/2007   PCP:  Biagio Borg, MD Pharmacy:   [facility] [place], [place] [address] Phone: [phone] Fax: [phone]  [facility] [id number] [place], [place] [address] Phone: [phone] Fax: [phone]  [place], [place] [address] Phone: [phone] Fax: [place] ([place]), [place] - [address] [place] [address] Phone: [phone] Fax: [place] [address], [place] [address] Phone: [phone] Fax: [phone]     Social Determinants of Health (SDOH) Interventions    Readmission Risk Interventions No flowsheet data found.

## 2021-04-18 ENCOUNTER — Inpatient Hospital Stay (HOSPITAL_COMMUNITY): Payer: Medicare Other

## 2021-04-18 DIAGNOSIS — G9341 Metabolic encephalopathy: Secondary | ICD-10-CM

## 2021-04-18 DIAGNOSIS — R4182 Altered mental status, unspecified: Secondary | ICD-10-CM

## 2021-04-18 LAB — CBC
HCT: 26 % — ABNORMAL LOW (ref 36.0–46.0)
Hemoglobin: 8.3 g/dL — ABNORMAL LOW (ref 12.0–15.0)
MCH: 32.2 pg (ref 26.0–34.0)
MCHC: 31.9 g/dL (ref 30.0–36.0)
MCV: 100.8 fL — ABNORMAL HIGH (ref 80.0–100.0)
Platelets: 322 10*3/uL (ref 150–400)
RBC: 2.58 MIL/uL — ABNORMAL LOW (ref 3.87–5.11)
RDW: 13.7 % (ref 11.5–15.5)
WBC: 10.7 10*3/uL — ABNORMAL HIGH (ref 4.0–10.5)
nRBC: 0 % (ref 0.0–0.2)

## 2021-04-18 LAB — BASIC METABOLIC PANEL
Anion gap: 12 (ref 5–15)
BUN: 11 mg/dL (ref 8–23)
CO2: 19 mmol/L — ABNORMAL LOW (ref 22–32)
Calcium: 8.9 mg/dL (ref 8.9–10.3)
Chloride: 105 mmol/L (ref 98–111)
Creatinine, Ser: 0.95 mg/dL (ref 0.44–1.00)
GFR, Estimated: >60 mL/min (ref >60)
Glucose, Bld: 124 mg/dL — ABNORMAL HIGH (ref 70–99)
Potassium: 3.6 mmol/L (ref 3.5–5.1)
Sodium: 136 mmol/L (ref 135–145)

## 2021-04-18 LAB — GLUCOSE, CAPILLARY
Glucose-Capillary: 141 mg/dL — ABNORMAL HIGH (ref 70–99)
Glucose-Capillary: 156 mg/dL — ABNORMAL HIGH (ref 70–99)
Glucose-Capillary: 132 mg/dL — ABNORMAL HIGH (ref 70–99)
Glucose-Capillary: 163 mg/dL — ABNORMAL HIGH (ref 70–99)

## 2021-04-18 MED ORDER — ADULT MULTIVITAMIN W/MINERALS CH
1.0000 | ORAL_TABLET | Freq: Every day | ORAL | Status: DC
  Administered 2021-04-18 – 2021-04-24 (×7): 1 via ORAL
  Filled 2021-04-18 (×7): qty 1

## 2021-04-18 MED ORDER — VITAMIN D (ERGOCALCIFEROL) 1.25 MG (50000 UNIT) PO CAPS: 50000.0000 [IU] | ORAL_CAPSULE | ORAL | 0 refills | Status: DC

## 2021-04-18 MED ORDER — GLUCERNA SHAKE PO LIQD
237.0000 mL | Freq: Three times a day (TID) | ORAL | Status: DC
  Administered 2021-04-18 – 2021-04-24 (×18): 237 mL via ORAL
  Filled 2021-04-18 (×2): qty 237

## 2021-04-18 MED ORDER — VITAMIN D (ERGOCALCIFEROL) 1.25 MG (50000 UNIT) PO CAPS
50000.0000 [IU] | ORAL_CAPSULE | ORAL | Status: DC
  Administered 2021-04-18: 50000 [IU] via ORAL
  Filled 2021-04-18 (×2): qty 1

## 2021-04-18 MED ORDER — GABAPENTIN 100 MG PO CAPS: 200.0000 mg | ORAL_CAPSULE | Freq: Two times a day (BID) | ORAL | Status: DC

## 2021-04-18 MED ORDER — DIPHENHYDRAMINE HCL 25 MG PO CAPS
25.0000 mg | ORAL_CAPSULE | Freq: Four times a day (QID) | ORAL | Status: DC | PRN
  Administered 2021-04-18: 25 mg via ORAL
  Filled 2021-04-18: qty 1

## 2021-04-18 MED ORDER — TRAMADOL HCL 50 MG PO TABS: 50.0000 mg | ORAL_TABLET | Freq: Four times a day (QID) | ORAL | 0 refills | Status: DC | PRN

## 2021-04-18 MED ORDER — ENOXAPARIN SODIUM 40 MG/0.4ML IJ SOSY: 40.0000 mg | PREFILLED_SYRINGE | INTRAMUSCULAR | 0 refills | Status: DC

## 2021-04-18 MED ORDER — GABAPENTIN 100 MG PO CAPS
200.0000 mg | ORAL_CAPSULE | Freq: Two times a day (BID) | ORAL | Status: DC
  Administered 2021-04-18: 200 mg via ORAL
  Filled 2021-04-18: qty 2

## 2021-04-18 NOTE — TOC Progression Note (Addendum)
Transition of Care [facility]) - Progression Note    Patient Details  Name: [name] MRN: [id number] Date of Birth: Aug 31, 1951  Transition of Care [facility]) CM/SW Contact  Sherry Chars, LCSW Phone Number: 04/18/2021, 3:15 PM  Clinical Narrative:   MD messaged that family is asking about CIR option.  CSW spoke with [name]/PT who responded that pt would not be candidate for CIR.   CSW met with husband and daughter [name] in room.  Discussed level of intensity at CIR, PT not making this recommendation.  CSW presented current bed offers--family not interested in any of the current offers.  Discussed HH option--family may be interested in [facility] if it was possible to receive PT more than 3x per week.  Family asked that CSW reach out ot [facility] and [place] SNFs.  [facility] then did offer a bed, referral faxed to [facility] at [place].  CSW spoke with [name] at [facility] is possible for PT to do more than 3 sessions per week but this is based on their assessment.  If indicated, they could do this.  This information also relayed to the family who will consider these options overnight.      Expected Discharge Plan: [place] Barriers to Discharge: Continued Medical Work up, SNF Pending bed offer  Expected Discharge Plan and Services Expected Discharge Plan: [place] In-house Referral: Clinical Social Work   Post Acute Care Choice: [place] Living arrangements for the past 2 months: Single Family Home                                       Social Determinants of Health (SDOH) Interventions    Readmission Risk Interventions No flowsheet data found.

## 2021-04-18 NOTE — Progress Notes (Signed)
PROGRESS NOTE    [name]  [id number] DOB: Mar 17, 1951 DOA: 04/14/2021 PCP: Biagio Borg, MD   Chief Complain: Chest pressure  Brief Narrative:  Patient is a 70 year old female with history of hypertension, hyperlipidemia, coronary artery disease, diabetes type 2, hypothyroidism, cervical radiculopathy, chronic back pain, anxiety, depression, GERD who presented with chest pressure from home.  Recently, she was found to be weak, falling frequently, confused, fatigued, had weight loss.  On presentation she was hemodynamically stable.  CT head and CT cervical spine did not show any acute abnormalities.  Chest x-ray did not show any pneumonia.  Lab work showed mild leukocytosis.  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  Orthopedics consulted and she underwent  ORIF on 04/16/21.  PT/OT recommending skilled nursing facility on discharge.  Assessment & Plan:   Principal Problem:   Weakness Active Problems:   HLD (hyperlipidemia)   Macrocytic anemia   Hypothyroidism   Left lower quadrant abdominal pain   CKD (chronic kidney disease), stage III (HCC)   Leg pain, lateral, right   Acute metabolic encephalopathy   Recurrent falls   Type 2 diabetes mellitus (HCC)   Weight loss   Leukocytosis   Iatrogenic thyrotoxicosis   Generalized weakness/recurrent falls/right distal femur fracture: Lives with husband at home.  Found to be weak, needing assistance with ambulation, confusion.  CT head/cervical spine did not show any acute fractures.  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  Orthopedics consulted and underwent  ORIF on 04/16/21. Continue pain management, supportive care.  PT/OT evaluation done.  Recommended skilled nursing facility. On lovenox for dvt ppx  Acute metabolic encephalopathy: Patient found to be confused since last 3 to 4 weeks at  home.  CT head did not show any acute abnormalities.  Cundt have MRI of the brain due to neurostimulator in her back.  Continue to monitor mental status. Normal ammonia,pending  vitamin B1.Normal Vitamin 123456 ,folic acid. She was on high dose of gabapentin which has been decreased.  We will try to minimize sedatives.  Keep the room bright with day sunlight.  Husband denies any history of dementia.  Husband thinks that high level of thyroid hormone could have contributed to that. Will check CT head again .She remains confused.  Last CT showed generalized atrophy, chronic microvascular changes, she might have developed new onset dementia  Leukocytosis: Mild.  X-ray did not show any pneumonia.  Urinalysis was not suggestive of UTI.  Continue monitoring,now improved  Abdominal pain/recent  history of diverticulitis: She was recently treated for acute diverticulitis twice, in May and June.  Abdomen/pelvis CT didn't show any evidence of bowel wall thickening, distention, or inflammatory changes. Showed colonic diverticulosis without evidence of acute diverticulitis.  History of hypothyroidism/now hyperthyroid: Low TSH, elevated free T4.  Takes levothyroxine at home which will be held for now.  Patient will be advised to repeat TFT in 4 weeks and follow-up with her endocrinologist.  Macrocytic anemia: Hemoglobin currently stable in in the range of 9-10.  Normal vitamin 123456, folic acid level.  Hypertension: Continue home regimen.  On metoprolol, amlodipine, clonidine, isosorbide mononitrate  History of weight loss/poor appetite: We have consulted dietitian  History of diabetes type 2/peripheral neuropathy: HbA1c of 7.Has history of diabetic neuropathy.  On Lantus, Humalog, Trulicity at home.  Continue to monitor blood sugars.  Continue current insulin regimen.  Continue gabapentin for the neuropathy with decreased dose due to encephalopathy  CKD stage IIIb: Currently kidney function at  baseline/normal.  Hyperlipidemia: On Lipitor  GERD: On Protonix  Chronic pain syndrome: Continue supportive care, pain management  Severe vitamin D deficiency: Vitamin D of just 8, started on aggressive supplementation           DVT prophylaxis:Lovenox Code Status:Full  Family Communication: Discussed with husband at bedside on 04/17/2021  status is: Inpatient  Remains inpatient appropriate because:Inpatient level of care appropriate due to severity of illness  Dispo: The patient is from: Home              Anticipated d/c is to: SNF              Patient currently is not medically stable to d/c.   Difficult to place patient No     Consultants: Orthopedics  Procedures:Plan for ORIF  Antimicrobials:  Anti-infectives (From admission, onward)    Start     Dose/Rate Route Frequency Ordered Stop   04/16/21 2100  clindamycin (CLEOCIN) IVPB 900 mg       Note to Pharmacy: Please give 8 hours from intraoperative dose   900 mg 100 mL/hr over 30 Minutes Intravenous Every 8 hours 04/16/21 1619 04/17/21 1511   04/16/21 1344  vancomycin (VANCOCIN) powder  Status:  Discontinued          As needed 04/16/21 1344 04/16/21 1412   04/16/21 1230  clindamycin (CLEOCIN) IVPB 900 mg        900 mg 100 mL/hr over 30 Minutes Intravenous  Once 04/16/21 1225 04/16/21 1307   04/16/21 1226  clindamycin (CLEOCIN) 900 MG/50ML IVPB       Note to Pharmacy: Cameron Sprang   : cabinet override      04/16/21 1226 04/16/21 1323       Subjective:  Patient seen and examined the bedside this morning.  Still remains confused.  Hemodynamically  stable.  Comfortable.  Denies any complaints.  Pain well controlled  Objective: Vitals:   04/17/21 1925 04/17/21 2130 04/18/21 0500 04/18/21 0529  BP: (!) 150/80 (!) 148/69  (!) 156/75  Pulse: 87   (!) 115  Resp: 19   17  Temp: 98.3 F (36.8 C)   98.3 F (36.8 C)  TempSrc: Oral   Oral  SpO2: 100%   100%  Weight:   113.4 kg   Height:         Intake/Output Summary (Last 24 hours) at 04/18/2021 0745 Last data filed at 04/18/2021 0556 Gross per 24 hour  Intake 150 ml  Output 1100 ml  Net -950 ml   Filed Weights   04/16/21 1141 04/17/21 0750 04/18/21 0500  Weight: 111.6 kg 111.6 kg 113.4 kg    Examination:  General exam: Overall comfortable, not in distress,obese HEENT: PERRL Respiratory system:  no wheezes or crackles  Cardiovascular system: S1 & S2 heard, RRR.  Gastrointestinal system: Abdomen is nondistended, soft and nontender. Central nervous system: Alert and awake but not oriented Extremities: No edema, no clubbing ,no cyanosis, surgical wound on the right hip Skin: No rashes, no ulcers,no icterus     Data Reviewed: I have personally reviewed following labs and imaging studies  CBC: Recent Labs  Lab 04/15/21 0235 04/15/21 0733 04/16/21 0242 04/17/21 0415 04/18/21 0141  WBC 11.1*  --  11.0* 13.9* 10.7*  NEUTROABS 7.5  --   --  10.9*  --   HGB 9.8* 10.9* 10.1* 9.5* 8.3*  HCT 30.2* 33.3* 32.0* 30.2* 26.0*  MCV 101.3*  --  100.3* 102.0* 100.8*  PLT 346  --  373 366 [id number]   Basic Metabolic Panel: Recent Labs  Lab 04/15/21 0235 04/16/21 0242 04/17/21 0415 04/18/21 0141  NA 139 137 136 136  K 3.7 3.6 3.9 3.6  CL 109 105 107 105  CO2 22 21* 18* 19*  GLUCOSE 138* 153* 147* 124*  BUN '15 12 12 11  '$ CREATININE 1.16* 1.07* 1.01* 0.95  CALCIUM 9.6 9.9 9.3 8.9   GFR: Estimated Creatinine Clearance: 68.3 mL/min (by C-G formula based on SCr of 0.95 mg/dL). Liver Function Tests: Recent Labs  Lab 04/15/21 0235  AST 14*  ALT 13  ALKPHOS 84  BILITOT 1.2  PROT 7.0  ALBUMIN 3.1*   No results for input(s): LIPASE, AMYLASE in the last 168 hours. Recent Labs  Lab 04/17/21 0415  AMMONIA 35   Coagulation Profile: No results for input(s): INR, PROTIME in the last 168 hours. Cardiac Enzymes: No results for input(s): CKTOTAL, CKMB, CKMBINDEX, TROPONINI in the last 168 hours. BNP (last 3 results) No  results for input(s): PROBNP in the last 8760 hours. HbA1C: Recent Labs    04/15/21 0856  HGBA1C 7.0*   CBG: Recent Labs  Lab 04/17/21 0754 04/17/21 1153 04/17/21 1702 04/17/21 2008 04/18/21 0723  GLUCAP 155* 171* 134* 146* 141*   Lipid Profile: No results for input(s): CHOL, HDL, LDLCALC, TRIG, CHOLHDL, LDLDIRECT in the last 72 hours. Thyroid Function Tests: No results for input(s): TSH, T4TOTAL, FREET4, T3FREE, THYROIDAB in the last 72 hours.  Anemia Panel: Recent Labs    04/15/21 0910  FOLATE 15.3   Sepsis Labs: No results for input(s): PROCALCITON, LATICACIDVEN in the last 168 hours.  Recent Results (from the past 240 hour(s))  Resp Panel by RT-PCR (Flu A&B, Covid) Nasopharyngeal Swab     Status: None   Collection Time: 04/14/21 12:51 AM   Specimen: Nasopharyngeal Swab; Nasopharyngeal(NP) swabs in vial transport medium  Result Value Ref Range Status   SARS Coronavirus 2 by RT PCR NEGATIVE NEGATIVE Final    Comment: (NOTE) SARS-CoV-2 target nucleic acids are NOT DETECTED.  The SARS-CoV-2 RNA is generally detectable in upper respiratory specimens during the acute phase of infection. The lowest concentration of SARS-CoV-2 viral copies this assay can detect is 138 copies/mL. A negative result does not preclude SARS-Cov-2 infection and should not be used as the sole basis for treatment or other patient management decisions. A negative result may occur with  improper specimen collection/handling, submission of specimen other than nasopharyngeal swab, presence of viral mutation(s) within the areas targeted by this assay, and inadequate number of viral copies(<138 copies/mL). A negative result must be combined with clinical observations, patient history, and epidemiological information. The expected result is Negative.  Fact Sheet for Patients:  EntrepreneurPulse.com.au  Fact Sheet for Healthcare Providers:   IncredibleEmployment.be  This test is no t yet approved or cleared by the [place] FDA and  has been authorized for detection and/or diagnosis of SARS-CoV-2 by FDA under an Emergency Use Authorization (EUA). This EUA will remain  in effect (meaning this test can be used) for the duration of the COVID-19 declaration under Section 564(b)(1) of the Act, 21 U.S.C.section 360bbb-3(b)(1), unless the authorization is terminated  or revoked sooner.       Influenza A by PCR NEGATIVE NEGATIVE Final   Influenza B by PCR NEGATIVE NEGATIVE Final    Comment: (NOTE) The Xpert Xpress SARS-CoV-2/FLU/RSV plus assay  is intended as an aid in the diagnosis of influenza from Nasopharyngeal swab specimens and should not be used as a sole basis for treatment. Nasal washings and aspirates are unacceptable for Xpert Xpress SARS-CoV-2/FLU/RSV testing.  Fact Sheet for Patients: EntrepreneurPulse.com.au  Fact Sheet for Healthcare Providers: IncredibleEmployment.be  This test is not yet approved or cleared by the [place] FDA and has been authorized for detection and/or diagnosis of SARS-CoV-2 by FDA under an Emergency Use Authorization (EUA). This EUA will remain in effect (meaning this test can be used) for the duration of the COVID-19 declaration under Section 564(b)(1) of the Act, 21 U.S.C. section 360bbb-3(b)(1), unless the authorization is terminated or revoked.  Performed at [facility], [place] [address] [place], [place] [address]   Urine culture     Status: Abnormal   Collection Time: 04/15/21  6:10 AM   Specimen: Urine, Random  Result Value Ref Range Status   Specimen Description URINE, RANDOM  Final   Special Requests NONE  Final   Culture (A)  Final    <10,000 COLONIES/mL INSIGNIFICANT GROWTH Performed at [facility], [place] [address], [place], [place] [address]    Report Status 04/16/2021 FINAL  Final  MRSA  Next Gen by PCR, Nasal     Status: None   Collection Time: 04/16/21 11:09 AM   Specimen: Nasal Mucosa; Nasal Swab  Result Value Ref Range Status   MRSA by PCR Next Gen NOT DETECTED NOT DETECTED Final    Comment: (NOTE) The GeneXpert MRSA Assay (FDA approved for NASAL specimens only), is one component of a comprehensive MRSA colonization surveillance program. It is not intended to diagnose MRSA infection nor to guide or monitor treatment for MRSA infections. Test performance is not FDA approved in patients less than 41 years old. Performed at [facility], [place] [address] [place], [place] [address]          Radiology Studies: DG Shoulder Right  Result Date: 04/17/2021 CLINICAL DATA:  Right shoulder pain EXAM: RIGHT SHOULDER - 2+ VIEW COMPARISON:  2010 images only FINDINGS: Remodeling and spurring at the acromioclavicular joint is chronic and was present 2010 likely reflecting sequelae of prior injury. There is minor spurring at the glenoid. No substantial narrowing of the glenohumeral joint. IMPRESSION: Minor degenerative changes at the glenohumeral joint. Chronic changes at the acromioclavicular joint. Electronically Signed   By: Macy Mis M.D.   On: 04/17/2021 16:16   DG Knee Right Port  Result Date: 04/16/2021 CLINICAL DATA:  ORIF distal femoral fracture EXAM: PORTABLE RIGHT KNEE - 4 VIEW COMPARISON:  04/16/2021 fluoroscopic spot images, and knee radiographs from 04/15/2021 FINDINGS: Four views of the right knee demonstrate a lateral plate and screw fixator along the distal femur believed to be spanning the oblique and longitudinal nondisplaced fracture of the distal femur which extends into the lateral metaphysis. Four distal bicondylar screws and 1 or more proximal screws are present. Proximally and distally the plate appears to have close proximity to the cortex although there does appear to be a 5 mm gap between the plate and cortex at the distal metadiaphysis. A total  knee prosthesis is observed. Heterotopic calcification along the distal quadriceps, chronic. IMPRESSION: Lateral ORIF of the distal femur fracture, with plate and screw fixator spanning the nondisplaced fracture as noted above. Electronically Signed   By: Van Clines M.D.   On: 04/16/2021 21:01   DG C-Arm 1-60 Min  Result Date: 04/16/2021 CLINICAL DATA:  Surgery,  elective. Additional history provided: Open reduction internal fixation (ORIF) distal femur fracture. Provided fluoroscopy time 45 seconds (3.22 mGy). EXAM: RIGHT FEMUR 2 VIEWS; DG C-ARM 1-60 MIN COMPARISON:  Radiographs of the right knee 04/15/2021. FINDINGS: Five intraoperative fluoroscopic images of the right femur are submitted. On the provided images, there are findings of interval ORIF of a known right femoral diaphyseal fracture (utilizing a lateral plate and multiple interlocking screws). Alignment appears anatomic. Redemonstrated sequela of prior right knee arthroplasty. IMPRESSION: Five intraoperative fluoroscopic images of the right femur from right femoral ORIF, as described. Electronically Signed   By: Kellie Simmering DO   On: 04/16/2021 15:31   DG FEMUR, MIN 2 VIEWS RIGHT  Result Date: 04/16/2021 CLINICAL DATA:  Surgery, elective. Additional history provided: Open reduction internal fixation (ORIF) distal femur fracture. Provided fluoroscopy time 45 seconds (3.22 mGy). EXAM: RIGHT FEMUR 2 VIEWS; DG C-ARM 1-60 MIN COMPARISON:  Radiographs of the right knee 04/15/2021. FINDINGS: Five intraoperative fluoroscopic images of the right femur are submitted. On the provided images, there are findings of interval ORIF of a known right femoral diaphyseal fracture (utilizing a lateral plate and multiple interlocking screws). Alignment appears anatomic. Redemonstrated sequela of prior right knee arthroplasty. IMPRESSION: Five intraoperative fluoroscopic images of the right femur from right femoral ORIF, as described. Electronically Signed   By:  Kellie Simmering DO   On: 04/16/2021 15:31        Scheduled Meds:  amLODipine  10 mg Oral Daily   aspirin EC  81 mg Oral Daily   atorvastatin  40 mg Oral QHS   Chlorhexidine Gluconate Cloth  6 each Topical Q0600   cloNIDine  0.1 mg Oral BID   docusate sodium  100 mg Oral BID   enoxaparin (LOVENOX) injection  40 mg Subcutaneous Q24H   gabapentin  100 mg Oral BID   insulin aspart  0-15 Units Subcutaneous TID WC   insulin glargine  20 Units Subcutaneous Daily   isosorbide mononitrate  60 mg Oral Daily   metoprolol succinate  50 mg Oral Daily   pantoprazole  40 mg Oral BID   sodium chloride flush  3 mL Intravenous Q12H   Continuous Infusions:     LOS: 3 days    Time spent: 25 mins,More than 50% of that time was spent in counseling and/or coordination of care.      Shelly Coss, MD [facility] P7/13/2022, 7:45 AM

## 2021-04-18 NOTE — Plan of Care (Signed)
  Problem: Education: Goal: Knowledge of General Education information will improve Description Including pain rating scale, medication(s)/side effects and non-pharmacologic comfort measures Outcome: Progressing   Problem: Health Behavior/Discharge Planning: Goal: Ability to manage health-related needs will improve Outcome: Progressing   

## 2021-04-18 NOTE — Discharge Instructions (Signed)
Orthopaedic Trauma Service Discharge Instructions   General Discharge Instructions  WEIGHT BEARING STATUS:weightbearing as tolerated  RANGE OF MOTION/ACTIVITY:Unrestricted knee motion  Wound Care: Incisions can be left open to air if there is no drainage. If incision continues to have drainage, follow wound care instructions below. Okay to shower if no drainage from incisions.  DVT/PE prophylaxis: Lovenox daily x 30 days  Diet: as you were eating previously.  Can use over the counter stool softeners and bowel preparations, such as Miralax, to help with bowel movements.  Narcotics can be constipating.  Be sure to drink plenty of fluids  PAIN MEDICATION USE AND EXPECTATIONS  You have likely been given narcotic medications to help control your pain.  After a traumatic event that results in an fracture (broken bone) with or without surgery, it is ok to use narcotic pain medications to help control one's pain.  We understand that everyone responds to pain differently and each individual patient will be evaluated on a regular basis for the continued need for narcotic medications. Ideally, narcotic medication use should last no more than 6-8 weeks (coinciding with fracture healing).   As a patient it is your responsibility as well to monitor narcotic medication use and report the amount and frequency you use these medications when you come to your office visit.   We would also advise that if you are using narcotic medications, you should take a dose prior to therapy to maximize you participation.  IF YOU ARE ON NARCOTIC MEDICATIONS IT IS NOT PERMISSIBLE TO OPERATE A MOTOR VEHICLE (MOTORCYCLE/CAR/TRUCK/MOPED) OR HEAVY MACHINERY DO NOT MIX NARCOTICS WITH OTHER CNS (CENTRAL NERVOUS SYSTEM) DEPRESSANTS SUCH AS ALCOHOL   STOP SMOKING OR USING NICOTINE PRODUCTS!!!!  As discussed nicotine severely impairs your body's ability to heal surgical and traumatic wounds but also impairs bone healing.  Wounds  and bone heal by forming microscopic blood vessels (angiogenesis) and nicotine is a vasoconstrictor (essentially, shrinks blood vessels).  Therefore, if vasoconstriction occurs to these microscopic blood vessels they essentially disappear and are unable to deliver necessary nutrients to the healing tissue.  This is one modifiable factor that you can do to dramatically increase your chances of healing your injury.    (This means no smoking, no nicotine gum, patches, etc)  DO NOT USE NONSTEROIDAL ANTI-INFLAMMATORY DRUGS (NSAID'S)  Using products such as Advil (ibuprofen), Aleve (naproxen), Motrin (ibuprofen) for additional pain control during fracture healing can delay and/or prevent the healing response.  If you would like to take over the counter (OTC) medication, Tylenol (acetaminophen) is ok.  However, some narcotic medications that are given for pain control contain acetaminophen as well. Therefore, you should not exceed more than 4000 mg of tylenol in a day if you do not have liver disease.  Also note that there are may OTC medicines, such as cold medicines and allergy medicines that my contain tylenol as well.  If you have any questions about medications and/or interactions please ask your doctor/PA or your pharmacist.      ICE AND ELEVATE INJURED/OPERATIVE EXTREMITY  Using ice and elevating the injured extremity above your heart can help with swelling and pain control.  Icing in a pulsatile fashion, such as 20 minutes on and 20 minutes off, can be followed.    Do not place ice directly on skin. Make sure there is a barrier between to skin and the ice pack.    Using frozen items such as frozen peas works well as the conform nicely to  the are that needs to be iced.  USE AN ACE WRAP OR TED HOSE FOR SWELLING CONTROL  In addition to icing and elevation, Ace wraps or TED hose are used to help limit and resolve swelling.  It is recommended to use Ace wraps or TED hose until you are informed to stop.     When using Ace Wraps start the wrapping distally (farthest away from the body) and wrap proximally (closer to the body)   Example: If you had surgery on your leg or thing and you do not have a splint on, start the ace wrap at the toes and work your way up to the thigh        If you had surgery on your upper extremity and do not have a splint on, start the ace wrap at your fingers and work your way up to the upper arm   [place]: [phone]   VISIT OUR WEBSITE FOR ADDITIONAL INFORMATION: [email or web address]     Discharge Wound Care Instructions  Do NOT apply any ointments, solutions or lotions to pin sites or surgical wounds.  These prevent needed drainage and even though solutions like hydrogen peroxide kill bacteria, they also damage cells lining the pin sites that help fight infection.  Applying lotions or ointments can keep the wounds moist and can cause them to breakdown and open up as well. This can increase the risk for infection. When in doubt call the office.  If any drainage is noted, use one layer of adaptic, then gauze, Kerlix, and an ace wrap.  Once the incision is completely dry and without drainage, it may be left open to air out.  Showering may begin 36-48 hours later.  Cleaning gently with soap and water.

## 2021-04-18 NOTE — Progress Notes (Signed)
Physical Therapy Treatment Patient Details Name: [name] MRN: [id number] DOB: Apr 27, 1951 Today's Date: 04/18/2021    History of Present Illness [name] is a 70 y.o. female presenting to ED 7/9 with substernal chest pressure, recurrent falls, decreased p.o. intake, nausea and SOB. Imaging (+) R supracondylar distal femur fracture s/p ORIF 7/11 with WBAT. PMHx significant for DMII, CKD stage III-IV, CAD, HTN, HLD, hypothyroidism, depression/anxiety and chronic back pain s/p spinal cord stimulator.    PT Comments    Pt demonstrated improved responsiveness to one-step commands, although speed of responsiveness remains unchanged. Pt required max A with +2 physical assistance for supine to sit and required +3 physical assistance for sit to supine for safety purposes. Pt had improved sitting balance and was able to sit upright for longer period of time from previous session. Pt used L lateral trunk lean and LUE to relieve pain on RLE. Attempted sit to stand transfer with use of steady; however, pt's fear of falling limited successful transfer, even with encouragement from husband. Pt's fear of falling heightened during today's session. Social work reached out to PT about CIR for pt. At this time, do not feel as though pt is appropriate. Discussed ordering tilt bed with pt, husband, nurse, and MD to progress mobility. Tilt bed ordered.  Follow Up Recommendations  SNF     Equipment Recommendations  None recommended by PT;Other (comment)    Recommendations for Other Services       Precautions / Restrictions Precautions Precautions: Fall Precaution Comments:  Restrictions RLE Weight Bearing: Weight bearing as tolerated    Mobility  Bed Mobility Overal bed mobility: Needs Assistance Bed Mobility: Supine to Sit;Sit to Supine     Supine to sit: Mod assist;+2 for physical assistance;+2 for safety/equipment Sit to supine: Max assist (+3 assist)   General bed mobility comments:  +2 assistance for safety, as pt mentioned fear of falling. +2 physical assitance for RLE for supine to sit. Pt reported pain in seated position. When returning to bed (sit to supine), pt required +3 max A, as pt was fearful and difficult to mobilize    Transfers Overall transfer level: Needs assistance               General transfer comment: Sit to stand from EOB unsuccessful. Pt reported fear of falling when attempting to progress into standing. Required +2 physical assistance max A for attempt, with pads, gait belt, and steady.  Pt unable to clear buttock off of bed. Appeared to be resisting against assistance, pushing down toward the bed rather than standing.   Ambulation/Gait                 Stairs             Wheelchair Mobility    Modified Rankin (Stroke Patients Only)       Balance Overall balance assessment: Needs assistance     Sitting balance - Comments: Pt able to maintain sitting balance and demonstrates less of a L lateral trunk lean compared to previous session. Uses LUE at times to relieve pain after sitting upright.                                    Cognition Arousal/Alertness: Awake/alert Behavior During Therapy: Flat affect Overall Cognitive Status: History of cognitive impairments - at baseline  General Comments: Pt demonstrated decreased responsiveness to one-step commands. Uncertain if pt was reluctant to participate due to pain in RLE      Exercises General Exercises - Lower Extremity Quad Sets: Strengthening;Other (comment) (Pt performed 2 quad sets bilaterally and educated on performing exercise daily.) Straight Leg Raises: AROM;5 reps;Right;Other (comment);Strengthening (Pt demonstrated decreased strength (2-) and ROM (20 degrees) when performing straight leg raise. Required assistance to achieve 30 degrees of hip flexion.)    General Comments        Pertinent  Vitals/Pain Vitals stable throughout Pain Assessment: 0-10 (Pt initially mentioned pain was a score of 5. Then, when asked again, stated no pain) Pain Score: 5  Pain Location: RLE Pain Descriptors / Indicators: Grimacing;Moaning Pain Intervention(s): Limited activity within patient's tolerance;Monitored during session    Home Living                      Prior Function            PT Goals (current goals can now be found in the care plan section) Acute Rehab PT Goals Patient Stated Goal: To decrease pain Time For Goal Achievement: 05/01/21 Potential to Achieve Goals: Fair Progress towards PT goals: Progressing toward goals    Frequency    Min 3X/week      PT Plan Current plan remains appropriate    Co-evaluation              AM-PAC PT "6 Clicks" Mobility   Outcome Measure  Help needed turning from your back to your side while in a flat bed without using bedrails?: Total Help needed moving from lying on your back to sitting on the side of a flat bed without using bedrails?: Total Help needed moving to and from a bed to a chair (including a wheelchair)?: Total Help needed standing up from a chair using your arms (e.g., wheelchair or bedside chair)?: Total Help needed to walk in hospital room?: Total Help needed climbing 3-5 steps with a railing? : Total 6 Click Score: 6    End of Session Equipment Utilized During Treatment: Gait belt Activity Tolerance: Patient limited by pain;Other (comment) (Activity also limited by fear of falling) Patient left: in bed;with bed alarm set;with nursing/sitter in room;with call bell/phone within reach;with family/visitor present Nurse Communication: Mobility status PT Visit Diagnosis: Repeated falls (R29.6);Muscle weakness (generalized) (M62.81);History of falling (Z91.81);Other abnormalities of gait and mobility (R26.89)     Time: [id number] PT Time Calculation (min) (ACUTE ONLY): 37 min  Charges:  $Therapeutic  Exercise: 8-22 mins $Therapeutic Activity: 8-22 mins                     Louie Casa, SPT Acute Rehab: [phone] [id number]    Domingo Dimes 04/18/2021, 2:11 PM

## 2021-04-18 NOTE — Progress Notes (Signed)
Orthopaedic Trauma Progress Note  SUBJECTIVE: Doing okay today, remains slightly confused. Reports minimal pain about operative site. No other complaints. Husband at bedside  OBJECTIVE:  Vitals:   04/18/21 0529 04/18/21 0823  BP: (!) 156/75 (!) 147/60  Pulse: (!) 115 (!) 105  Resp: 17 18  Temp: 98.3 F (36.8 C) 97.9 F (36.6 C)  SpO2: 100% 100%    General: Sitting up in bed, NAD Respiratory: No increased work of breathing.  RLE: Dressing removed, incisions are CDI. No significant tenderness with palpation. Has some decreased sensation with light touch over plantar aspect of foot but sensation intact over remainder of extremity. Foot warm and well perfused  IMAGING: Stable post op imaging.   LABS:  Results for orders placed or performed during the hospital encounter of 04/14/21 (from the past 24 hour(s))  Glucose, capillary     Status: Abnormal   Collection Time: 04/17/21  5:02 PM  Result Value Ref Range   Glucose-Capillary 134 (H) 70 - 99 mg/dL  Glucose, capillary     Status: Abnormal   Collection Time: 04/17/21  8:08 PM  Result Value Ref Range   Glucose-Capillary 146 (H) 70 - 99 mg/dL  Basic metabolic panel     Status: Abnormal   Collection Time: 04/18/21  1:41 AM  Result Value Ref Range   Sodium 136 135 - 145 mmol/L   Potassium 3.6 3.5 - 5.1 mmol/L   Chloride 105 98 - 111 mmol/L   CO2 19 (L) 22 - 32 mmol/L   Glucose, Bld 124 (H) 70 - 99 mg/dL   BUN 11 8 - 23 mg/dL   Creatinine, Ser 0.95 0.44 - 1.00 mg/dL   Calcium 8.9 8.9 - 10.3 mg/dL   GFR, Estimated >60 >60 mL/min   Anion gap 12 5 - 15  CBC     Status: Abnormal   Collection Time: 04/18/21  1:41 AM  Result Value Ref Range   WBC 10.7 (H) 4.0 - 10.5 K/uL   RBC 2.58 (L) 3.87 - 5.11 MIL/uL   Hemoglobin 8.3 (L) 12.0 - 15.0 g/dL   HCT 26.0 (L) 36.0 - 46.0 %   MCV 100.8 (H) 80.0 - 100.0 fL   MCH 32.2 26.0 - 34.0 pg   MCHC 31.9 30.0 - 36.0 g/dL   RDW 13.7 11.5 - 15.5 %   Platelets 322 150 - 400 K/uL   nRBC 0.0 0.0  - 0.2 %  Glucose, capillary     Status: Abnormal   Collection Time: 04/18/21  7:23 AM  Result Value Ref Range   Glucose-Capillary 141 (H) 70 - 99 mg/dL  Glucose, capillary     Status: Abnormal   Collection Time: 04/18/21 11:22 AM  Result Value Ref Range   Glucose-Capillary 156 (H) 70 - 99 mg/dL   *Note: Due to a large number of results and/or encounters for the requested time period, some results have not been displayed. A complete set of results can be found in Results Review.    ASSESSMENT: [name] is a 69 y.o. female, 2 Days Post-Op s/p ORIF RIGHT DISTAL FEMUR FRACTURE  CV/Blood loss: Acute blood loss anemia, Hgb 8.3 this AM.   PLAN: Weightbearing: WBAT RLE ROM: Unrestricted knee motion Incisional and dressing care: Ok to leave open to air Showering: Ok to begin showering with assistance starting 04/19/21 Orthopedic device(s): None  Pain management:  1. Robaxin 500 mg q 6 hours PRN 2. Tramadol 50-100 mg q 6 hours PRN 3. Neurontin 200 mg  BID 4. Morphine 2 mg q 3 hours PRN VTE prophylaxis: Lovenox, SCDs ID: Clindamycin post op completed Foley/Lines:  No foley, KVO IVFs Impediments to Fracture Healing: Vit d level 8, started on supplementation. This should be continued at d/c Dispo: Therapies as tolerated, PT/OT recommending SNF. Ok for d/c from ortho standpoint once cleared by therapies and medicine team. Have signed and placed rx for pain meds, Vit D, and DVT prophylaxis in chart Follow - up plan: 2 weeks after d/c for repeat x-rays and wound check  Contact information:  Katha Hamming MD, Patrecia Pace PA-C. After hours and holidays please check [email or web address] for group call information for [facility]   Karron Alvizo A. Ricci Barker, PA-C [phone] (office) [email or web address]

## 2021-04-18 NOTE — Telephone Encounter (Signed)
It has been refaxed again and [employer] is also refaxing

## 2021-04-18 NOTE — Consult Note (Addendum)
Neurology Consultation  Reason for Consult: Encephalopathy. Referring Physician: A. Tawanna Solo, MD.   CC: Confusion.   History is obtained from: Husband, chart.   HPI: [name] is a 70 y.o. female with a PMHx of anxiety, HTN, HLD, CAD, DM II, hypothyroidism, chronic back pain, and GERD who presented 3 days ago with c/o chest pressure. Husband stated that patient had been weak at home with confusion and not acting herself for about a week. Recent weight loss and depressive mood. Falls at home which lead to a right femur fx s/p ORIF on 04/16/21.   Patient was able to name objects and knew her family members for past week. Husband states she has not been eating well and not as active. She has not been able to navigate stairs and required coaching and extra assistance. Husband states she has been over worried about things and for past week, just wanted to stay in bed and watch TV. She has also had minor confusion at times x 2-3 weeks, but not to this degree. No history of dementia.   Husband states her Synthroid was adjusted in March when her TSH was so high. She has not been back to have TSH checked after dose change.   Workup thus far showed TSH 0.110, Vitamin B12 [id number], B1 pending, Folic acid normal, and Vit D 8.74. WBCC 13.9 04/17/21 and 10.7 today. CTH showed no acute intracranial pathology with mild age related atrophy and CSVI.   In review of chart, patient was seen by PCP for some issues with her hands. Looks as if she was started on Tizanidine. Weight on 03/02/21 was 285 lbs. And on 03/21/21 it was 260 lbs. On 03/29/21 visit she was on Synthroid 112 mcg decreased from 237mg in past. She has also been treated for diverticulitis x 3 recently. Per 12/26/20 endocrinology note, she was on 2247m of Synthroid   Neurology asked to consult for encephalopathy.   ROS: A robust ROS was performed and is negative except as noted in the HPI. + loss of appetite.   Past Medical History:  Diagnosis Date    Anxiety    Arthritis    Blood dyscrasia    "FREE BLEEDER"   CERVICAL RADICULOPATHY, LEFT    Chronic back pain    CKD (chronic kidney disease)    [facility]   COMMON MIGRAINE    CORONARY ARTERY DISEASE    Cough    CURRENT COLD   Cystitis    DEPRESSION    Diabetes mellitus, type II (HCC)    DIVERTICULOSIS-COLON    Dysrhythmia    palpitations   Gastric ulcer 04/2008   Gastroparesis    GERD (gastroesophageal reflux disease)    Hiatal hernia    Hyperlipidemia    Hypertension    Hypothyroidism    INSOMNIA-SLEEP DISORDER-UNSPEC    Iron deficiency anemia    Wears glasses     Family History  Problem Relation Age of Onset   Diabetes Sister        x 3   Heart disease Sister        x2   Breast cancer Sister [id number] Diabetes Brother        x3   Heart disease Brother        x2   Coronary artery disease Other        female 1st degree   Hypertension Other    Breast cancer Sister [id number] Breast cancer Other 25  Diabetes Brother    Colon cancer Neg Hx     Social History:   reports that she has quit smoking. She has never used smokeless tobacco. She reports that she does not drink alcohol and does not use drugs.  Medications  Current Facility-Administered Medications:    albuterol (PROVENTIL) (2.5 MG/3ML) 0.083% nebulizer solution 2.5 mg, 2.5 mg, Nebulization, Q6H PRN, Ricci Barker, Sarah A, PA-C   amLODipine (NORVASC) tablet 10 mg, 10 mg, Oral, Daily, Adhikari, Amrit, MD, 10 mg at 04/18/21 [id number]   aspirin EC tablet 81 mg, 81 mg, Oral, Daily, Delray Alt, PA-C, 81 mg at 04/18/21 [id number]   atorvastatin (LIPITOR) tablet 40 mg, 40 mg, Oral, QHS, Yacobi, Sarah A, PA-C, 40 mg at 04/17/21 2130   Chlorhexidine Gluconate Cloth 2 % PADS 6 each, 6 each, Topical, Q0600, Delray Alt, PA-C, 6 each at 04/18/21 [id number]   cloNIDine (CATAPRES) tablet 0.1 mg, 0.1 mg, Oral, BID, Delray Alt, PA-C, 0.1 mg at 04/18/21 [id number]   diphenhydrAMINE (BENADRYL) capsule 25 mg, 25 mg, Oral, Q6H  PRN, Shelly Coss, MD, 25 mg at 04/18/21 1122   docusate sodium (COLACE) capsule 100 mg, 100 mg, Oral, BID, Delray Alt, PA-C, 100 mg at 04/18/21 0922   enoxaparin (LOVENOX) injection 40 mg, 40 mg, Subcutaneous, Q24H, Delray Alt, PA-C, 40 mg at 04/18/21 [id number]   gabapentin (NEURONTIN) capsule 200 mg, 200 mg, Oral, BID, Adhikari, Amrit, MD   HYDROmorphone (DILAUDID) injection 1 mg, 1 mg, Intravenous, Q4H PRN, Shelly Coss, MD, 1 mg at 04/18/21 0926   insulin aspart (novoLOG) injection 0-15 Units, 0-15 Units, Subcutaneous, TID WC, Delray Alt, PA-C, 3 Units at 04/18/21 1219   insulin glargine (LANTUS) injection 20 Units, 20 Units, Subcutaneous, Daily, Delray Alt, PA-C, 20 Units at 04/18/21 [id number]   isosorbide mononitrate (IMDUR) 24 hr tablet 60 mg, 60 mg, Oral, Daily, Delray Alt, PA-C, 60 mg at 04/18/21 [id number]   methocarbamol (ROBAXIN) tablet 500 mg, 500 mg, Oral, Q6H PRN, 500 mg at 04/16/21 1543 **OR** [DISCONTINUED] methocarbamol (ROBAXIN) 500 mg in dextrose 5 % 50 mL IVPB, 500 mg, Intravenous, Q6H PRN, Ricci Barker, Sarah A, PA-C   metoprolol succinate (TOPROL-XL) 24 hr tablet 50 mg, 50 mg, Oral, Daily, Patrecia Pace A, PA-C, 50 mg at 04/18/21 0922   ondansetron (ZOFRAN) tablet 4 mg, 4 mg, Oral, Q6H PRN **OR** ondansetron (ZOFRAN) injection 4 mg, 4 mg, Intravenous, Q6H PRN, Ricci Barker, Sarah A, PA-C, 4 mg at 04/16/21 1626   pantoprazole (PROTONIX) EC tablet 40 mg, 40 mg, Oral, BID, Patrecia Pace A, PA-C, 40 mg at 04/18/21 [id number]   polyethylene glycol (MIRALAX / GLYCOLAX) packet 17 g, 17 g, Oral, Daily PRN, Patrecia Pace A, PA-C   sodium chloride flush (NS) 0.9 % injection 3 mL, 3 mL, Intravenous, Q12H, Yacobi, Sarah A, PA-C, 3 mL at 04/18/21 [id number]   traMADol (ULTRAM) tablet 50-100 mg, 50-100 mg, Oral, Q6H PRN, Delray Alt, PA-C, 100 mg at 04/18/21 0556   Vitamin D (Ergocalciferol) (DRISDOL) capsule 50,000 Units, 50,000 Units, Oral, Q7 days, Delray Alt, PA-C, 50,000 Units at  04/18/21 1122   Exam: Current vital signs: BP (!) 147/60 (BP Location: Right Arm)   Pulse (!) 105   Temp 97.9 F (36.6 C) (Oral)   Resp 18   Ht '5\' 5"'$  (1.651 m)   Wt 113.4 kg   SpO2 100%   BMI 41.60 kg/m  Vital signs in last 24 hours: Temp:  [97.9 F (36.6 C)-98.3  F (36.8 C)] 97.9 F (36.6 C) (07/13 0823) Pulse Rate:  [87-115] 105 (07/13 0823) Resp:  [17-19] 18 (07/13 0823) BP: (141-156)/(60-80) 147/60 (07/13 0823) SpO2:  [100 %] 100 % (07/13 0823) Weight:  [113.4 kg] 113.4 kg (07/13 0500)  PE: GENERAL: Fairly well appearing elderly female. Awake, alert in NAD.  HEENT: normocephalic and atraumatic. LUNGS - Normal respiratory effort.  CV - has been tachycardic at times.  ABDOMEN - Soft, nontender. Ext: warm, well perfused. Staples noted to RLE  Psych: affect flattened.   NEURO:  Mental Status: Alert but disoriented to day, date, month, year, and place. She knows her name, but not age. She knew her husband and daughter's names. Poor attention. She does not follow commands most of the time, but occasionally.  Speech/Language: speech is without dysarthria or aphasia.  Her speech is hesitant, unable to name objects, states her hand is Jesus' hand, is able to show examiner a thumb's up, but unable to name thumb. The rest of questions, she perseverates or does not answer even when given time. Bradyphrenic. Speech is not fluent, mostly 1-3 word phrases. On follow up exam by Neurology attending, speech is fluent, but attention continues to be impaired and patient is unable to provide a coherent history of her symptoms.  Cranial Nerves:  II: PERRL   80m/brisk. VF-unable to tell how many fingers in fields. III, IV, VI: Does not understand EOM. Lid elevation symmetric and full.  V: sensation is intact and symmetrical to face.  VII: Smile is symmetrical.  VIII:hearing intact to voice. IX, X: Will not open mouth, even when asked by family.   XI: Does not understand directions.   [id number] open mouth.   Motor:  RUE: grips  4+/5   Does not understand exam for triceps and biceps.     LUE: Same as RUE.  BLE-some effort against gravity on left, but does not move RLE to command. She will withdraw RLE and say "ow" to noxious plantar stimulation.  Tone is normal. Bulk is normal.  Sensation- Intact to light touch bilaterally in all four extremities.  Coordination: can not perform, but no drift to UEs.  DTRs:  BUE:  biceps  1+    brachioradialis  1+ BLE:  patella 0     Gait- deferred.  Labs I have reviewed labs in epic and the results pertinent to this consultation are: Vitamin B12 915, ammonia 35, TSH 0.110 (39.8 in 3/22), FA normal.   CBC    Component Value Date/Time   WBC 10.7 (H) 04/18/2021 0141   RBC 2.58 (L) 04/18/2021 0141   HGB 8.3 (L) 04/18/2021 0141   HCT 26.0 (L) 04/18/2021 0141   PLT 322 04/18/2021 0141   MCV 100.8 (H) 04/18/2021 0141   MCH 32.2 04/18/2021 0141   MCHC 31.9 04/18/2021 0141   RDW 13.7 04/18/2021 0141   LYMPHSABS 2.2 04/17/2021 0415   MONOABS 0.6 04/17/2021 0415   EOSABS 0.1 04/17/2021 0415   BASOSABS 0.0 04/17/2021 0415    CMP     Component Value Date/Time   NA 136 04/18/2021 0141   K 3.6 04/18/2021 0141   CL 105 04/18/2021 0141   CO2 19 (L) 04/18/2021 0141   GLUCOSE 124 (H) 04/18/2021 0141   BUN 11 04/18/2021 0141   CREATININE 0.95 04/18/2021 0141   CREATININE 2.78 (H) 11/24/2020 1641   CALCIUM 8.9 04/18/2021 0141   PROT 7.0 04/15/2021 0235   ALBUMIN 3.1 (L) 04/15/2021 0235   AST 14 (  L) 04/15/2021 0235   ALT 13 04/15/2021 0235   ALKPHOS 84 04/15/2021 0235   BILITOT 1.2 04/15/2021 0235   GFRNONAA >60 04/18/2021 0141   GFRAA 33 (L) 01/25/2020 2017   Imaging  CT head No acute intracranial pathology. Mild age-related atrophy and chronic microvascular ischemic changes. No acute/traumatic cervical spine pathology. Multilevel degenerative changes.  Assessment: 70 yo female who presented with chest pressure,  confusion, fall, and weakness on 04/16/21. ORIF on 04/16/21.  - Suspect that her confusion and weakness are driven by her fluctuating thyroid lab results. There has been a drastic swing in past 3 months with adjustment dosing.  -Her weakness and weight loss may be contributing to her recent bouts of diverticulitis.  -May also be in the early stages of a degenerative dementing process such as AD -Her presentation is likely multifactorial due to thyroid and medications, such as Gabapentin. There was also a Rx for Tizanidine for last month which could result in confusion in the elderly. Post op narcotics are likely contributing to her increased confusion over last week at home. Delayed recovery from anesthesia could also be playing a role.   -Unable to obtain MRI due to spinal cord stimulator.   Impression: -Multi factorial encephalopathy.  -Low TSH in setting of hyperthyroidism.   Recommendations/Plan:  -Agree with holding of Synthroid.  -Minimize narcotics and sedatives of any type.  -Agree with decreasing or even stopping Gabapentin if possible. -Agree with Vit D replacement.  -Decrease or stop Robaxin as soon as able.  -Avoid anticholinergics (Benadryl).  -Continue to correct infection and metabolic derangements as you are doing.  -Avoid medications on the Beers list for elderly. [email or web address].[email or web address]/ -Delirium precautions.  -Fall precautions. -We will check in on patient tomorrow to see if her mental status is improving.  -Consider CT of lumbar spine to assess for possible structural etiology regarding RLE weakness, although this is felt most likely to be pain related given recent fx and surgery  Delirium precautions: Open blinds and lights on during the day.  Close blinds and turn off lights at bedtime.  Minimize the patient's confusion. Encourage mobility and self-care. Optimise nutrition, hydration and regular continence. Minimize risk of injury and  agitation. Minimize use of antipsychotic medications. Monitor and respond to pain. Use night-time strategies to promote sleep.  Pt seen by Clance Boll, NP/Neuro and later by MD.   Pager: [phone]  I have seen and examined the patient. I have reviewed the assessment and recommendations and made amendations as indicated. 70 yo female who presented with chest pressure, confusion, fall, and weakness on 04/16/21. ORIF on 04/16/21. Suspect that her confusion and weakness are driven by her fluctuating thyroid lab results. There has been a drastic swing in past 3 months with adjustment dosing. Her weakness and weight loss may be contributing to her recent bouts of diverticulitis. May also be in the early stages of a degenerative dementing process such as AD. Her presentation is likely multifactorial due to thyroid and medications, such as Gabapentin. There was also a Rx for Tizanidine for last month which could result in confusion in the elderly. Post op narcotics are likely contributing to her increased confusion over last week at home. Delayed recovery from anesthesia could also be playing a role.   Electronically signed: Dr. Kerney Elbe

## 2021-04-18 NOTE — Care Management Important Message (Signed)
Important Message  Patient Details  Name: [name] MRN: [id number] Date of Birth: [birth year]   Medicare Important Message Given:  Yes     Orbie Pyo 04/18/2021, 3:24 PM

## 2021-04-18 NOTE — Consult Note (Signed)
   [facility] CM Inpatient Consult   04/18/2021  [name] 1951-09-22 [id number]  [facility] [ACO] Patient: Medicare [name]  Primary Care Provider:  Biagio Borg, MD, [facility], an Embedded practice with a Chronic Care Management program  Patient screened for hospitalization [2 admission and 3 ED] with noted high risk score for unplanned readmission risk and to assess for potential [facility] Management follow up service needs for post hospital transition.  Review of patient's medical record reveals patient is being recommended for a skilled nursing facility stay for rehab.  Plan:  Continue to follow progress notes and disposition to assess for post hospital care management needs. If patient goes to a [facility] affiliated SNF will alert [facility] RN of ongoing post facility possible care needs.  For questions contact:   Natividad Brood, RN BSN [facility] Liaison  [phone] business mobile phone Toll free office [phone]  Fax number: [phone] [email or web address]

## 2021-04-18 NOTE — Telephone Encounter (Signed)
FMLA Papers are being refaxed to the company again. Confirmation will be placed in providers box

## 2021-04-19 ENCOUNTER — Telehealth: Payer: Self-pay | Admitting: Internal Medicine

## 2021-04-19 LAB — GLUCOSE, CAPILLARY
Glucose-Capillary: 178 mg/dL — ABNORMAL HIGH (ref 70–99)
Glucose-Capillary: 182 mg/dL — ABNORMAL HIGH (ref 70–99)
Glucose-Capillary: 217 mg/dL — ABNORMAL HIGH (ref 70–99)
Glucose-Capillary: 174 mg/dL — ABNORMAL HIGH (ref 70–99)

## 2021-04-19 MED ORDER — TRAMADOL HCL 50 MG PO TABS
50.0000 mg | ORAL_TABLET | Freq: Four times a day (QID) | ORAL | Status: DC | PRN
Start: 2021-04-19 — End: 2021-04-25
  Administered 2021-04-19 – 2021-04-24 (×12): 50 mg via ORAL
  Filled 2021-04-19 (×13): qty 1

## 2021-04-19 MED ORDER — HYDROMORPHONE HCL 1 MG/ML IJ SOLN
0.5000 mg | INTRAMUSCULAR | Status: DC | PRN
  Administered 2021-04-19: 0.5 mg via INTRAVENOUS
  Filled 2021-04-19: qty 1

## 2021-04-19 MED ORDER — MORPHINE SULFATE (PF) 2 MG/ML IV SOLN
2.0000 mg | INTRAVENOUS | Status: DC | PRN
  Administered 2021-04-20 – 2021-04-23 (×12): 2 mg via INTRAVENOUS
  Filled 2021-04-19 (×15): qty 1

## 2021-04-19 NOTE — Plan of Care (Signed)
  Problem: Education: Goal: Knowledge of General Education information will improve Description Including pain rating scale, medication(s)/side effects and non-pharmacologic comfort measures Outcome: Progressing   Problem: Health Behavior/Discharge Planning: Goal: Ability to manage health-related needs will improve Outcome: Progressing   

## 2021-04-19 NOTE — Progress Notes (Signed)
Physical Therapy Treatment Patient Details Name: [name] MRN: [id number] DOB: 11-01-1950 Today's Date: 04/19/2021    History of Present Illness [name] is a 70 y.o. female presenting to ED 7/9 with substernal chest pressure, recurrent falls, decreased p.o. intake, nausea and SOB. Imaging (+) R supracondylar distal femur fracture s/p ORIF 7/11 with WBAT. PMHx significant for DMII, CKD stage III-IV, CAD, HTN, HLD, hypothyroidism, depression/anxiety and chronic back pain s/p spinal cord stimulator.    PT Comments    Pt admitted with above diagnosis. Pt continues to make slow progress toward goals. Did utilize tilt bed today to get weight bearing on LEs however pt did not make attempts to perform any active movement of LES.  Did follow commmand one time to raise UEs.  Unsure if pt with motor processing issue vs behavioral issue as it does appear she can move the LEs randomly.  Husband present and appeared frustrated at pts lack of participation.  When addressed, pt will look at whomever is speaking to her with a wide eyed look as if she hears them, but does not respond to follow commands.  Pt currently with functional limitations due to balance and endurance deficits. Pt will benefit from skilled PT to increase their independence and safety with mobility to allow discharge to the venue listed below.      Follow Up Recommendations  SNF     Equipment Recommendations  None recommended by PT;Other (comment)    Recommendations for Other Services       Precautions / Restrictions Precautions Precautions: Fall Restrictions Weight Bearing Restrictions: Yes RLE Weight Bearing: Weight bearing as tolerated    Mobility  Bed Mobility               General bed mobility comments: pt in chair position in bed Start Time: 1145 Angle: 50 degrees Total Minutes in Angle: 20 minutes Patient Response: Flat affect  Transfers Overall transfer level: Needs assistance Equipment used:   (Tilt bed) Transfers: Sit to/from Stand Sit to Stand: Total assist;+2 safety/equipment         General transfer comment: Able to tilt pt to 50 degrees with 132 lbs weight on pts LEs.  Even with multiple cues and assist, pt with very little active movement of either LE. She was upright for at least 20 minutes.  Even tried to tilt her back to 30 degrees and get activation of LEs and could not elicit movement. Also once in chair position at end of session, attempts at movement of LEs unsuccessful except for slight movement of left LE inconsistently.  Ambulation/Gait                 Stairs             Wheelchair Mobility    Modified Rankin (Stroke Patients Only)       Balance Overall balance assessment: Needs assistance     Sitting balance - Comments: upright in bed, L lateral lean and supported with pillows Postural control: Left lateral lean                                  Cognition Arousal/Alertness: Lethargic Behavior During Therapy: Flat affect Overall Cognitive Status: History of cognitive impairments - at baseline  General Comments: patient oriented to self, requires choices to select birthday month. Disoriented to place (reports at home) and unable to select month given choices.  Poor attention and opens eyes/lift head to name and inital commands but not able to sustain any attention to complete task.      Exercises      General Comments General comments (skin integrity, edema, etc.): spouse present and asking about overmedication due to lethargy, RN notified; spouse asking to speak to MD      Pertinent Vitals/Pain Pain Assessment: No/denies pain Faces Pain Scale: Hurts little more Pain Location: iright LE Pain Descriptors / Indicators: Grimacing;Moaning Pain Intervention(s): Limited activity within patient's tolerance;Monitored during session;Repositioned    Home Living                       Prior Function            PT Goals (current goals can now be found in the care plan section) Acute Rehab PT Goals Patient Stated Goal: none stated Progress towards PT goals: Progressing toward goals    Frequency    Min 3X/week      PT Plan Current plan remains appropriate    Co-evaluation              AM-PAC PT "6 Clicks" Mobility   Outcome Measure  Help needed turning from your back to your side while in a flat bed without using bedrails?: Total Help needed moving from lying on your back to sitting on the side of a flat bed without using bedrails?: Total Help needed moving to and from a bed to a chair (including a wheelchair)?: Total Help needed standing up from a chair using your arms (e.g., wheelchair or bedside chair)?: Total Help needed to walk in hospital room?: Total Help needed climbing 3-5 steps with a railing? : Total 6 Click Score: 6    End of Session Equipment Utilized During Treatment: Gait belt Activity Tolerance: Patient limited by fatigue Patient left: in bed;with call bell/phone within reach;with family/visitor present Nurse Communication: Mobility status PT Visit Diagnosis: Repeated falls (R29.6);Muscle weakness (generalized) (M62.81);History of falling (Z91.81);Other abnormalities of gait and mobility (R26.89)     Time: [id number] PT Time Calculation (min) (ACUTE ONLY): 32 min  Charges:  $Therapeutic Exercise: 8-22 mins $Therapeutic Activity: 8-22 mins                     Adisa Vigeant M,PT Acute Rehab Services [phone] (pager)    Alvira Philips 04/19/2021, 1:55 PM

## 2021-04-19 NOTE — Progress Notes (Signed)
PROGRESS NOTE    [name]  [id number] DOB: 28-Apr-1951 DOA: 04/14/2021 PCP: Biagio Borg, MD   Chief Complain: Chest pressure  Brief Narrative:  Patient is a 70 year old female with history of hypertension, hyperlipidemia, coronary artery disease, diabetes type 2, hypothyroidism, cervical radiculopathy, chronic back pain, anxiety, depression, GERD who presented with chest pressure from home.  Recently, she was found to be weak, falling frequently, confused, fatigued, had weight loss.  On presentation she was hemodynamically stable.  CT head and CT cervical spine did not show any acute abnormalities.  Chest x-ray did not show any pneumonia.  Lab work showed mild leukocytosis.  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  Orthopedics consulted and she underwent  ORIF on 04/16/21.  PT/OT recommending skilled nursing facility on discharge.  Neurology consulted for persistent confusion.  Assessment & Plan:   Principal Problem:   Weakness Active Problems:   HLD (hyperlipidemia)   Macrocytic anemia   Hypothyroidism   Left lower quadrant abdominal pain   CKD (chronic kidney disease), stage III (HCC)   Leg pain, lateral, right   Acute metabolic encephalopathy   Recurrent falls   Type 2 diabetes mellitus (HCC)   Weight loss   Leukocytosis   Iatrogenic thyrotoxicosis   Generalized weakness/recurrent falls/right distal femur fracture: Lives with husband at home.  Found to be weak, needing assistance with ambulation, confusion.  CT head/cervical spine did not show any acute fractures.  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  Orthopedics consulted and underwent  ORIF on 04/16/21. Continue pain management, supportive care.  PT/OT evaluation done.  Recommended skilled nursing facility. On lovenox for dvt ppx  Acute metabolic encephalopathy: Patient  found to be confused since last 3 to 4 weeks at home.  CT head did not show any acute abnormalities.  Cundt have MRI of the brain due to neurostimulator in her back.  Continue to monitor mental status. Normal ammonia,pending  vitamin B1.Normal Vitamin 123456 ,folic acid. She was on high dose of gabapentin which has been decreased.  We will try to minimize sedatives.  Keep the room bright with day sunlight.  Husband denies any history of dementia.  Husband thinks that high level of thyroid hormone could have contributed to that. Repeat CT head without any acute findings .She remains confused.  Last CT showed generalized atrophy, chronic microvascular changes. We have also discontinued gabapentin.EEG can be done to rule out nonconvulsive seizures if recommended by neurology.  Leukocytosis: Mild.  X-ray did not show any pneumonia.  Urinalysis was not suggestive of UTI.  Continue monitoring,now improved  Abdominal pain/recent  history of diverticulitis: She was recently treated for acute diverticulitis twice, in May and June.  Abdomen/pelvis CT didn't show any evidence of bowel wall thickening, distention, or inflammatory changes. Showed colonic diverticulosis without evidence of acute diverticulitis.  History of hypothyroidism/now hyperthyroid: Low TSH, elevated free T4.  Takes levothyroxine at home which will be held for now.  Patient will be advised to repeat TFT in 4 weeks and follow-up with her endocrinologist.  Macrocytic anemia: Hemoglobin currently stable in in the range of 9-10.  Normal vitamin 123456, folic acid level.  Hypertension: Continue home regimen.  On metoprolol, amlodipine, clonidine, isosorbide mononitrate  History of weight loss/poor appetite: We have consulted dietitian.  She was noticed to be pocketing the food in the mouth.  Currently she  is on dysphagia 1 diet.  We have consulted speech.  History of diabetes type 2/peripheral neuropathy: HbA1c of 7.Has history of diabetic neuropathy.   On Lantus, Humalog, Trulicity at home.  Continue to monitor blood sugars.  Continue current insulin regimen.  Gabapentin on hold due to persistent confusion  CKD stage IIIb: Currently kidney function at baseline/normal.  Hyperlipidemia: On Lipitor  GERD: On Protonix  Chronic pain syndrome: Continue supportive care, pain management  Severe vitamin D deficiency: Vitamin D of just 8, started on aggressive supplementation    Nutrition Problem: Inadequate oral intake Etiology: poor appetite      DVT prophylaxis:Lovenox Code Status:Full  Family Communication: Discussed with husband at bedside on 04/19/2021  status is: Inpatient  Remains inpatient appropriate because:Inpatient level of care appropriate due to severity of illness  Dispo: The patient is from: Home              Anticipated d/c is to: SNF              Patient currently is not medically stable to d/c.   Difficult to place patient No     Consultants: Orthopedics  Procedures:Plan for ORIF  Antimicrobials:  Anti-infectives (From admission, onward)    Start     Dose/Rate Route Frequency Ordered Stop   04/16/21 2100  clindamycin (CLEOCIN) IVPB 900 mg       Note to Pharmacy: Please give 8 hours from intraoperative dose   900 mg 100 mL/hr over 30 Minutes Intravenous Every 8 hours 04/16/21 1619 04/17/21 1511   04/16/21 1344  vancomycin (VANCOCIN) powder  Status:  Discontinued          As needed 04/16/21 1344 04/16/21 1412   04/16/21 1230  clindamycin (CLEOCIN) IVPB 900 mg        900 mg 100 mL/hr over 30 Minutes Intravenous  Once 04/16/21 1225 04/16/21 1307   04/16/21 1226  clindamycin (CLEOCIN) 900 MG/50ML IVPB       Note to Pharmacy: Cameron Sprang   : cabinet override      04/16/21 1226 04/16/21 1323       Subjective:  Patient seen and examined the bedside this morning.  Husband was present at the bedside.  There is no significant change from yesterday.  Pain is well controlled on the ORIF site.  She  still remains confused.  Not agitated.  Objective: Vitals:   04/18/21 0800 04/18/21 0823 04/18/21 2012 04/19/21 0439  BP:  (!) 147/60 137/82 (!) 172/85  Pulse:  (!) 105 (!) 108 (!) 104  Resp:  '18 17 18  '$ Temp:  97.9 F (36.6 C) 98.8 F (37.1 C) 98.4 F (36.9 C)  TempSrc:  Oral    SpO2: 95% 100%  95%  Weight:      Height:        Intake/Output Summary (Last 24 hours) at 04/19/2021 0745 Last data filed at 04/19/2021 0531 Gross per 24 hour  Intake 3 ml  Output 350 ml  Net -347 ml   Filed Weights   04/16/21 1141 04/17/21 0750 04/18/21 0500  Weight: 111.6 kg 111.6 kg 113.4 kg    Examination:  General exam: Overall comfortable, not in distress,obese HEENT: PERRL Respiratory system:  no wheezes or crackles  Cardiovascular system: S1 & S2 heard, RRR.  Gastrointestinal system: Abdomen is nondistended, soft and nontender. Central nervous system: Alert and awake but not oriented Extremities: No edema, no clubbing ,no cyanosis,clean  surgical wound on the right hip Skin: No rashes, no  ulcers,no icterus     Data Reviewed: I have personally reviewed following labs and imaging studies  CBC: Recent Labs  Lab 04/15/21 0235 04/15/21 0733 04/16/21 0242 04/17/21 0415 04/18/21 0141  WBC 11.1*  --  11.0* 13.9* 10.7*  NEUTROABS 7.5  --   --  10.9*  --   HGB 9.8* 10.9* 10.1* 9.5* 8.3*  HCT 30.2* 33.3* 32.0* 30.2* 26.0*  MCV 101.3*  --  100.3* 102.0* 100.8*  PLT 346  --  373 366 [id number]   Basic Metabolic Panel: Recent Labs  Lab 04/15/21 0235 04/16/21 0242 04/17/21 0415 04/18/21 0141  NA 139 137 136 136  K 3.7 3.6 3.9 3.6  CL 109 105 107 105  CO2 22 21* 18* 19*  GLUCOSE 138* 153* 147* 124*  BUN '15 12 12 11  '$ CREATININE 1.16* 1.07* 1.01* 0.95  CALCIUM 9.6 9.9 9.3 8.9   GFR: Estimated Creatinine Clearance: 68.3 mL/min (by C-G formula based on SCr of 0.95 mg/dL). Liver Function Tests: Recent Labs  Lab 04/15/21 0235  AST 14*  ALT 13  ALKPHOS 84  BILITOT 1.2  PROT 7.0   ALBUMIN 3.1*   No results for input(s): LIPASE, AMYLASE in the last 168 hours. Recent Labs  Lab 04/17/21 0415  AMMONIA 35   Coagulation Profile: No results for input(s): INR, PROTIME in the last 168 hours. Cardiac Enzymes: No results for input(s): CKTOTAL, CKMB, CKMBINDEX, TROPONINI in the last 168 hours. BNP (last 3 results) No results for input(s): PROBNP in the last 8760 hours. HbA1C: No results for input(s): HGBA1C in the last 72 hours.  CBG: Recent Labs  Lab 04/18/21 0723 04/18/21 1122 04/18/21 1710 04/18/21 2014 04/19/21 0737  GLUCAP 141* 156* 132* 163* 178*   Lipid Profile: No results for input(s): CHOL, HDL, LDLCALC, TRIG, CHOLHDL, LDLDIRECT in the last 72 hours. Thyroid Function Tests: No results for input(s): TSH, T4TOTAL, FREET4, T3FREE, THYROIDAB in the last 72 hours.  Anemia Panel: No results for input(s): VITAMINB12, FOLATE, FERRITIN, TIBC, IRON, RETICCTPCT in the last 72 hours.  Sepsis Labs: No results for input(s): PROCALCITON, LATICACIDVEN in the last 168 hours.  Recent Results (from the past 240 hour(s))  Resp Panel by RT-PCR (Flu A&B, Covid) Nasopharyngeal Swab     Status: None   Collection Time: 04/14/21 12:51 AM   Specimen: Nasopharyngeal Swab; Nasopharyngeal(NP) swabs in vial transport medium  Result Value Ref Range Status   SARS Coronavirus 2 by RT PCR NEGATIVE NEGATIVE Final    Comment: (NOTE) SARS-CoV-2 target nucleic acids are NOT DETECTED.  The SARS-CoV-2 RNA is generally detectable in upper respiratory specimens during the acute phase of infection. The lowest concentration of SARS-CoV-2 viral copies this assay can detect is 138 copies/mL. A negative result does not preclude SARS-Cov-2 infection and should not be used as the sole basis for treatment or other patient management decisions. A negative result may occur with  improper specimen collection/handling, submission of specimen other than nasopharyngeal swab, presence of viral  mutation(s) within the areas targeted by this assay, and inadequate number of viral copies(<138 copies/mL). A negative result must be combined with clinical observations, patient history, and epidemiological information. The expected result is Negative.  Fact Sheet for Patients:  EntrepreneurPulse.com.au  Fact Sheet for Healthcare Providers:  IncredibleEmployment.be  This test is no t yet approved or cleared by the [place] FDA and  has been authorized for detection and/or diagnosis of SARS-CoV-2 by FDA under an Emergency Use Authorization (EUA). This EUA will remain  in effect (meaning this test can be used) for the duration of the COVID-19 declaration under Section 564(b)(1) of the Act, 21 U.S.C.section 360bbb-3(b)(1), unless the authorization is terminated  or revoked sooner.       Influenza A by PCR NEGATIVE NEGATIVE Final   Influenza B by PCR NEGATIVE NEGATIVE Final    Comment: (NOTE) The Xpert Xpress SARS-CoV-2/FLU/RSV plus assay is intended as an aid in the diagnosis of influenza from Nasopharyngeal swab specimens and should not be used as a sole basis for treatment. Nasal washings and aspirates are unacceptable for Xpert Xpress SARS-CoV-2/FLU/RSV testing.  Fact Sheet for Patients: EntrepreneurPulse.com.au  Fact Sheet for Healthcare Providers: IncredibleEmployment.be  This test is not yet approved or cleared by the [place] FDA and has been authorized for detection and/or diagnosis of SARS-CoV-2 by FDA under an Emergency Use Authorization (EUA). This EUA will remain in effect (meaning this test can be used) for the duration of the COVID-19 declaration under Section 564(b)(1) of the Act, 21 U.S.C. section 360bbb-3(b)(1), unless the authorization is terminated or revoked.  Performed at [facility], [place] [address] [place], [place] [address]   Urine culture     Status: Abnormal    Collection Time: 04/15/21  6:10 AM   Specimen: Urine, Random  Result Value Ref Range Status   Specimen Description URINE, RANDOM  Final   Special Requests NONE  Final   Culture (A)  Final    <10,000 COLONIES/mL INSIGNIFICANT GROWTH Performed at [facility], [place] [address], [place], [place] [address]    Report Status 04/16/2021 FINAL  Final  MRSA Next Gen by PCR, Nasal     Status: None   Collection Time: 04/16/21 11:09 AM   Specimen: Nasal Mucosa; Nasal Swab  Result Value Ref Range Status   MRSA by PCR Next Gen NOT DETECTED NOT DETECTED Final    Comment: (NOTE) The GeneXpert MRSA Assay (FDA approved for NASAL specimens only), is one component of a comprehensive MRSA colonization surveillance program. It is not intended to diagnose MRSA infection nor to guide or monitor treatment for MRSA infections. Test performance is not FDA approved in patients less than 72 years old. Performed at [facility], [place] [address] [place], [place] [address]          Radiology Studies: DG Shoulder Right  Result Date: 04/17/2021 CLINICAL DATA:  Right shoulder pain EXAM: RIGHT SHOULDER - 2+ VIEW COMPARISON:  2010 images only FINDINGS: Remodeling and spurring at the acromioclavicular joint is chronic and was present 2010 likely reflecting sequelae of prior injury. There is minor spurring at the glenoid. No substantial narrowing of the glenohumeral joint. IMPRESSION: Minor degenerative changes at the glenohumeral joint. Chronic changes at the acromioclavicular joint. Electronically Signed   By: Macy Mis M.D.   On: 04/17/2021 16:16   CT HEAD WO CONTRAST  Result Date: 04/18/2021 CLINICAL DATA:  Delirium. EXAM: CT HEAD WITHOUT CONTRAST TECHNIQUE: Contiguous axial images were obtained from the base of the skull through the vertex without intravenous contrast. COMPARISON:  CT head without contrast 04/14/2021. FINDINGS: Brain: Mild atrophy is stable, within normal limits for age. No  acute infarct, hemorrhage, or mass lesion is present. The ventricles are of normal size. No significant extraaxial fluid collection is present. The brainstem and cerebellum are within normal limits. Vascular: Minimal atherosclerotic changes are present. No hyperdense vessel is present. Skull: Calvarium is intact. No focal lytic or blastic lesions are present. There is some motion  the lower images. No significant extracranial soft tissue lesion is present. Sinuses/Orbits: The paranasal sinuses and mastoid air cells are clear. Globes are not well seen due to patient motion. IMPRESSION: Normal CT appearance of the brain for age. Electronically Signed   By: San Morelle M.D.   On: 04/18/2021 16:07        Scheduled Meds:  amLODipine  10 mg Oral Daily   aspirin EC  81 mg Oral Daily   atorvastatin  40 mg Oral QHS   Chlorhexidine Gluconate Cloth  6 each Topical Q0600   cloNIDine  0.1 mg Oral BID   docusate sodium  100 mg Oral BID   enoxaparin (LOVENOX) injection  40 mg Subcutaneous Q24H   feeding supplement (GLUCERNA SHAKE)  237 mL Oral TID BM   insulin aspart  0-15 Units Subcutaneous TID WC   insulin glargine  20 Units Subcutaneous Daily   isosorbide mononitrate  60 mg Oral Daily   metoprolol succinate  50 mg Oral Daily   multivitamin with minerals  1 tablet Oral Daily   pantoprazole  40 mg Oral BID   sodium chloride flush  3 mL Intravenous Q12H   Vitamin D (Ergocalciferol)  50,000 Units Oral Q7 days   Continuous Infusions:     LOS: 4 days    Time spent: 25 mins,More than 50% of that time was spent in counseling and/or coordination of care.      Shelly Coss, MD [facility] P7/14/2022, 7:45 AM

## 2021-04-19 NOTE — Progress Notes (Signed)
Occupational Therapy Treatment Patient Details Name: [name] MRN: [id number] DOB: 05-Oct-1951 Today's Date: 04/19/2021    History of present illness [name] is a 70 y.o. female presenting to ED 7/9 with substernal chest pressure, recurrent falls, decreased p.o. intake, nausea and SOB. Imaging (+) R supracondylar distal femur fracture s/p ORIF 7/11 with WBAT. PMHx significant for DMII, CKD stage III-IV, CAD, HTN, HLD, hypothyroidism, depression/anxiety and chronic back pain s/p spinal cord stimulator.   OT comments  Patient in chair position upon entry, spouse at side and RN exiting room. Patient lethargic, but lifts head and opens eyes to name.  Requires constant cueing throughout session, demonstrates poor attention to task and not following commands today.  Requires total assist for grooming (washing hands and face).  Oriented to self only, given choices for birthday month able to select correctly.   Unable to self month of year, reports she is at home and is re-oriented. RN notified of lethargy, cognition and husbands request to speak to MD.  Will follow acutely.    Follow Up Recommendations  SNF;Supervision/Assistance - 24 hour    Equipment Recommendations  Other (comment) (defer to next level of care)    Recommendations for Other Services      Precautions / Restrictions Precautions Precautions: Fall Restrictions Weight Bearing Restrictions: Yes RLE Weight Bearing: Weight bearing as tolerated       Mobility Bed Mobility               General bed mobility comments: pt in chair position in bed    Transfers Overall transfer level: Needs assistance Equipment used:  (Tilt bed) Transfers: Sit to/from Stand Sit to Stand: Total assist;+2 safety/equipment         General transfer comment: Able to tilt pt to 50 degrees with 132 lbs weight on pts LEs.  Even with multiple cues and assist, pt with very little active movement of either LE. She was upright for a  good long time.    Balance Overall balance assessment: Needs assistance Sitting-balance support: Single extremity supported Sitting balance-Leahy Scale: Poor Sitting balance - Comments: upright in bed, L lateral lean and supported with pillows Postural control: Left lateral lean     Standing balance comment: Standing balance unable to be assessed. Pain limiting.                           ADL either performed or assessed with clinical judgement   ADL Overall ADL's : Needs assistance/impaired     Grooming: Total assistance;Bed level Grooming Details (indicate cue type and reason): attempted to wash face and hands, requires total assist due to cognition and lethargy                                     Vision       Perception     Praxis      Cognition Arousal/Alertness: Lethargic Behavior During Therapy: Flat affect Overall Cognitive Status: History of cognitive impairments - at baseline                                 General Comments: patient oriented to self, requires choices to select birthday month. Disoriented to place (reports at home) and unable to select month given choices.  Poor attention and opens eyes/lift head  to name and inital commands but not able to sustain any attention to complete task.        Exercises General Exercises - Lower Extremity Quad Sets: Strengthening;Other (comment) (Pt performed 2 quad sets bilaterally and educated on performing exercise daily.) Straight Leg Raises: AROM;5 reps;Right;Other (comment);Strengthening (Pt demonstrated decreased strength (2-) and ROM (20 degrees) when performing straight leg raise. Required assistance to achieve 30 degrees of hip flexion.)   Shoulder Instructions       General Comments spouse present and asking about overmedication due to lethargy, RN notified; spouse asking to speak to MD    Pertinent Vitals/ Pain       Pain Assessment: No/denies pain Faces Pain  Scale: Hurts little more Pain Location: iright LE Pain Descriptors / Indicators: Grimacing;Moaning Pain Intervention(s): Limited activity within patient's tolerance;Monitored during session;Repositioned  Home Living                                          Prior Functioning/Environment              Frequency  Min 2X/week        Progress Toward Goals  OT Goals(current goals can now be found in the care plan section)  Progress towards OT goals: Not progressing toward goals - comment (lethargy and not following commands)  Acute Rehab OT Goals Patient Stated Goal: none stated  Plan Discharge plan remains appropriate;Frequency remains appropriate    Co-evaluation                 AM-PAC OT "6 Clicks" Daily Activity     Outcome Measure   Help from another person eating meals?: Total Help from another person taking care of personal grooming?: Total Help from another person toileting, which includes using toliet, bedpan, or urinal?: Total Help from another person bathing (including washing, rinsing, drying)?: Total Help from another person to put on and taking off regular upper body clothing?: Total Help from another person to put on and taking off regular lower body clothing?: Total 6 Click Score: 6    End of Session    OT Visit Diagnosis: Unsteadiness on feet (R26.81);Repeated falls (R29.6);Muscle weakness (generalized) (M62.81);Pain   Activity Tolerance Patient limited by lethargy   Patient Left in bed;with bed alarm set;with call bell/phone within reach   Nurse Communication Mobility status (lethargy)        Time: [id number] OT Time Calculation (min): 22 min  Charges: OT General Charges $OT Visit: 1 Visit OT Treatments $Self Care/Home Management : 8-22 mins  Jolaine Artist, [place] Pager [phone] Office [phone]    Delight Stare 04/19/2021, 1:54 PM

## 2021-04-19 NOTE — Telephone Encounter (Signed)
Type of form received: FMLA paperwork  Additional comments:   Received by: Theodosia Quay  Form should be Faxed to: [phone] or [phone]  Form should be mailed to:  n/a  Is patient requesting call for pickup: n/a   Form placed in the Provider's box  Attach charge sheet.  Provider will determine charge.  Was patient informed of  7-10 business day turn around (Y/N)?  no

## 2021-04-19 NOTE — Evaluation (Signed)
Clinical/Bedside Swallow Evaluation Patient Details  Name: [name] MRN: [id number] Date of Birth: 1950/12/03  Today's Date: 04/19/2021 Time: SLP Start Time (ACUTE ONLY): 1045 SLP Stop Time (ACUTE ONLY): 1129 SLP Time Calculation (min) (ACUTE ONLY): 44 min  Past Medical History:  Past Medical History:  Diagnosis Date   Anxiety    Arthritis    Blood dyscrasia    "FREE BLEEDER"   CERVICAL RADICULOPATHY, LEFT    Chronic back pain    CKD (chronic kidney disease)    DR. SANFORD  [facility]   COMMON MIGRAINE    CORONARY ARTERY DISEASE    Cough    CURRENT COLD   Cystitis    DEPRESSION    Diabetes mellitus, type II (HCC)    DIVERTICULOSIS-COLON    Dysrhythmia    palpitations   Gastric ulcer 04/2008   Gastroparesis    GERD (gastroesophageal reflux disease)    Hiatal hernia    Hyperlipidemia    Hypertension    Hypothyroidism    INSOMNIA-SLEEP DISORDER-UNSPEC    Iron deficiency anemia    Wears glasses    Past Surgical History:  Past Surgical History:  Procedure Laterality Date   ABDOMINAL HYSTERECTOMY     ANTERIOR LAT LUMBAR FUSION Left 08/13/2017   Procedure: LEFT SIDED LUMBAR 3-4 LATERAL INTERBODY FUSION WITH INSTRUMENTATION AND ALLOGRAFT;  Surgeon: Phylliss Bob, MD;  Location: [place];  Service: Orthopedics;  Laterality: Left;  LEFT SIDED LUMBAR 3-4 LATERAL INTERBODY FUSION WITH INSTRUMENTATION AND ALLOGRAFT; REQUEST 3 HOURS   Back Stimulator  07/2018   BACK SURGERY  03/2016   BREAST EXCISIONAL BIOPSY Right    40 years ago   CHOLECYSTECTOMY  06/2009   COLONOSCOPY     ESOPHAGOGASTRODUODENOSCOPY     EYE SURGERY Bilateral    lasik   Gastric Wedge resection lipoma  11/2007   x2 with laparotomy and gastrostomy   JOINT REPLACEMENT     Left knee replacement     ORIF FEMUR FRACTURE Right 04/16/2021   Procedure: OPEN REDUCTION INTERNAL FIXATION (ORIF) DISTAL FEMUR FRACTURE;  Surgeon: Shona Needles, MD;  Location: [place] [facility];  Service: Orthopedics;  Laterality: Right;    Rigth knee replacement with revision  04/2008   Dr. Berenice Primas   ROTATOR CUFF REPAIR Left 01/2009   s/p bladder surgury  09/2009   Dr. Terance Hart   SPINAL CORD STIMULATOR INSERTION N/A 09/11/2018   Procedure: LUMBAR SPINAL CORD STIMULATOR INSERTION;  Surgeon: Clydell Hakim, MD;  Location: [facility];  Service: Neurosurgery;  Laterality: N/A;  LUMBAR SPINAL CORD STIMULATOR INSERTION   HPI:  Pt is a 70 y.o. female who presented with complaints of chest pressure, recurrent falls, decreased p.o. intake, nausea and SOB. CT head was normal. Pt found to have R supracondylar distal femur fracture; s/p ORIF on 04/16/21. Neurology suspected 7/13 that the pt's confusion and weakness are driven by her fluctuating thyroid lab results. PMH: hypertension, hyperlipidemia, CAD, DM type II, hypothyroidism, cervical radiculopathy, chronic back pain, anxiety, depression, and GERD.   Assessment / Plan / Recommendation Clinical Impression  Pt was seen for bedside swallow evaluation with her husband and daughter present. Pt's husband denied the pt having a history of dysphagia prior and indicated that she consumes regular texture solids and thin liquids at baseline. Pt was lethargic during the session and she inconsistently responded to questions or followed commands. Oral mechanism exam was limited due to pt's difficulty following commands; she presented with maxillary dentures and absent mandibular teeth. Pt demonstrated  symptoms of oropharyngeal dysphagia characterized by reduced bolus awareness, oral holding, and inconsistent signs of aspiration with thin liquids. Pt did not accept liquid boluses via cup despite cues and she required consistent encouragement from her husband to accept any boluses during the evaluation. Grimacing was intermittently noted during the evaluation, and pt reported pain during deglutition. She identified the right pharynx as the site of pain, but did not provide any information regarding the nature of the  pain, and she denied any variation with consistency. The impact of lethargy on her overall performance is considered. A dysphagia 1 diet with nectar thick liquids is recommendation at this time with allowance of thin water and ice chips between meals after oral care.  Pt's daughter was contacted by the pt's her husband via phone. Pt's presentation, aspiration risk, diet recommendations, the rationale for allowance of a water protocol, and plan of care were discussed at length with the daughter and husband. Pt's daughter expressed frustration regarding pt's medical management and the desire to have pt's thyroid medication started. Pt ultimately verbalized agreement with current diet recommendations, but despite extensive education, stated that "drinking water versus drinking Pedialyte or Gatorade don't make no sense to me; I don't see no difference". Pt's daughter declined further education to improve understanding.  SLP Visit Diagnosis: Dysphagia, unspecified (R13.10)    Aspiration Risk  Mild aspiration risk;Moderate aspiration risk    Diet Recommendation Dysphagia 1 (Puree);Nectar-thick liquid (thin water and ice chips allowed between meals following oral care)  Liquid Administration via: Cup;Straw;Spoon Medication Administration: Crushed with puree Supervision: Full supervision/cueing for compensatory strategies (Staff to feed pt) Compensations: Minimize environmental distractions;Slow rate;Small sips/bites;Follow solids with liquid (check for pocketing) Postural Changes: Seated upright at 90 degrees    Other  Recommendations     Follow up Recommendations Skilled Nursing facility      Frequency and Duration min 2x/week  2 weeks       Prognosis Prognosis for Safe Diet Advancement: Fair Barriers to Reach Goals: Cognitive deficits;Motivation      Swallow Study   General Date of Onset: 04/18/21 HPI: Pt is a 70 y.o. female who presented with complaints of chest pressure, recurrent  falls, decreased p.o. intake, nausea and SOB. CT head was normal. Pt found to have R supracondylar distal femur fracture; s/p ORIF on 04/16/21. Neurology suspected 7/13 that the pt's confusion and weakness are driven by her fluctuating thyroid lab results. PMH: hypertension, hyperlipidemia, CAD, DM type II, hypothyroidism, cervical radiculopathy, chronic back pain, anxiety, depression, and GERD. Type of Study: Bedside Swallow Evaluation Previous Swallow Assessment: none Diet Prior to this Study: Dysphagia 1 (puree);Thin liquids Temperature Spikes Noted: No Respiratory Status: Room air History of Recent Intubation: No Behavior/Cognition: Lethargic/Drowsy;Cooperative;Pleasant mood;Confused Oral Cavity Assessment: Within Functional Limits Oral Care Completed by SLP: No Oral Cavity - Dentition: Dentures, top Vision: Functional for self-feeding Self-Feeding Abilities: Total assist Patient Positioning: Upright in bed;Postural control adequate for testing Baseline Vocal Quality: Low vocal intensity Volitional Cough: Cognitively unable to elicit Volitional Swallow: Able to elicit    Oral/Motor/Sensory Function Overall Oral Motor/Sensory Function:  (UTA due to pt's refusal to participate)   Ice Chips Ice chips: Within functional limits Presentation: Spoon   Thin Liquid Thin Liquid: Impaired Presentation: Straw;Spoon Oral Phase Impairments: Poor awareness of bolus Oral Phase Functional Implications: Oral holding Pharyngeal  Phase Impairments: Cough - Immediate    Nectar Thick Nectar Thick Liquid: Not tested   Honey Thick Honey Thick Liquid: Not tested   Puree Puree: Impaired  Presentation: Spoon Oral Phase Impairments: Poor awareness of bolus Oral Phase Functional Implications: Oral holding   Solid     Solid: Not tested (pt refused despite encouragement from SLP and pt's family)     Layken Doenges I. Hardin Negus, [place], [place] Office number [phone] Pager  [phone]  Horton Marshall 04/19/2021,12:24 PM

## 2021-04-20 LAB — CBC WITH DIFFERENTIAL/PLATELET
Abs Immature Granulocytes: 0.06 10*3/uL (ref 0.00–0.07)
Basophils Absolute: 0 10*3/uL (ref 0.0–0.1)
Basophils Relative: 0 %
Eosinophils Absolute: 0.3 10*3/uL (ref 0.0–0.5)
Eosinophils Relative: 3 %
HCT: 28.7 % — ABNORMAL LOW (ref 36.0–46.0)
Hemoglobin: 8.9 g/dL — ABNORMAL LOW (ref 12.0–15.0)
Immature Granulocytes: 1 %
Lymphocytes Relative: 25 %
Lymphs Abs: 2.7 10*3/uL (ref 0.7–4.0)
MCH: 32 pg (ref 26.0–34.0)
MCHC: 31 g/dL (ref 30.0–36.0)
MCV: 103.2 fL — ABNORMAL HIGH (ref 80.0–100.0)
Monocytes Absolute: 0.6 10*3/uL (ref 0.1–1.0)
Monocytes Relative: 6 %
Neutro Abs: 7 10*3/uL (ref 1.7–7.7)
Neutrophils Relative %: 65 %
Platelets: 353 10*3/uL (ref 150–400)
RBC: 2.78 MIL/uL — ABNORMAL LOW (ref 3.87–5.11)
RDW: 13.8 % (ref 11.5–15.5)
WBC: 10.8 10*3/uL — ABNORMAL HIGH (ref 4.0–10.5)
nRBC: 0 % (ref 0.0–0.2)

## 2021-04-20 LAB — BASIC METABOLIC PANEL
Anion gap: 12 (ref 5–15)
BUN: 20 mg/dL (ref 8–23)
CO2: 20 mmol/L — ABNORMAL LOW (ref 22–32)
Calcium: 9.9 mg/dL (ref 8.9–10.3)
Chloride: 106 mmol/L (ref 98–111)
Creatinine, Ser: 1.08 mg/dL — ABNORMAL HIGH (ref 0.44–1.00)
GFR, Estimated: 55 mL/min — ABNORMAL LOW (ref >60)
Glucose, Bld: 140 mg/dL — ABNORMAL HIGH (ref 70–99)
Potassium: 3.7 mmol/L (ref 3.5–5.1)
Sodium: 138 mmol/L (ref 135–145)

## 2021-04-20 LAB — GLUCOSE, CAPILLARY
Glucose-Capillary: 149 mg/dL — ABNORMAL HIGH (ref 70–99)
Glucose-Capillary: 147 mg/dL — ABNORMAL HIGH (ref 70–99)
Glucose-Capillary: 263 mg/dL — ABNORMAL HIGH (ref 70–99)
Glucose-Capillary: 174 mg/dL — ABNORMAL HIGH (ref 70–99)

## 2021-04-20 NOTE — Progress Notes (Signed)
PROGRESS NOTE    [name]  [id number] DOB: 1951-03-02 DOA: 04/14/2021 PCP: Biagio Borg, MD   Chief Complain: Chest pressure  Brief Narrative:  Patient is a 70 year old female with history of hypertension, hyperlipidemia, coronary artery disease, diabetes type 2, hypothyroidism, cervical radiculopathy, chronic back pain, anxiety, depression, GERD who presented with chest pressure from home.  Recently, she was found to be weak, falling frequently, confused, fatigued, had weight loss.  On presentation she was hemodynamically stable.  CT head and CT cervical spine did not show any acute abnormalities.  Chest x-ray did not show any pneumonia.  Lab work showed mild leukocytosis.  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  Orthopedics consulted and she underwent  ORIF on 04/16/21.  PT/OT recommending skilled nursing facility on discharge.  Neurology consulted for persistent confusion.  Assessment & Plan:   Principal Problem:   Weakness Active Problems:   HLD (hyperlipidemia)   Macrocytic anemia   Hypothyroidism   Left lower quadrant abdominal pain   CKD (chronic kidney disease), stage III (HCC)   Leg pain, lateral, right   Acute metabolic encephalopathy   Recurrent falls   Type 2 diabetes mellitus (HCC)   Weight loss   Leukocytosis   Iatrogenic thyrotoxicosis   Generalized weakness/recurrent falls/right distal femur fracture: Lives with husband at home.  Found to be weak, needing assistance with ambulation, confusion.  CT head/cervical spine did not show any acute fractures.  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  Orthopedics consulted and underwent  ORIF on 04/16/21. Continue pain management, supportive care.  PT/OT evaluation done.  Recommended skilled nursing facility.Family interested to take her home with home health On lovenox for  dvt ppx  Acute metabolic encephalopathy: Patient found to be confused since last 3 to 4 weeks at home.  CT head did not show any acute abnormalities.  Cundt have MRI of the brain due to neurostimulator in her back.  Continue to monitor mental status. Normal ammonia,pending  vitamin B1.Normal Vitamin 123456 ,folic acid. She was on high dose of gabapentin which has been decreased.  We will try to minimize sedatives.  Keep the room bright with day sunlight.  Husband denies any history of dementia.  Husband thinks that high level of thyroid hormone could have contributed to that. Repeat CT head without any acute findings .She remains confused.  Last CT showed generalized atrophy, chronic microvascular changes. We have also discontinued gabapentin.EEG can be done to rule out nonconvulsive seizures if recommended by neurology.  Leukocytosis: Mild.  X-ray did not show any pneumonia.  Urinalysis was not suggestive of UTI.  Continue monitoring,now improved  Abdominal pain/recent  history of diverticulitis: She was recently treated for acute diverticulitis twice, in May and June.  Abdomen/pelvis CT didn't show any evidence of bowel wall thickening, distention, or inflammatory changes. Showed colonic diverticulosis without evidence of acute diverticulitis.  History of hypothyroidism/now hyperthyroid: Low TSH, elevated free T4.  Takes levothyroxine at home which will be held for now.  Patient will be advised to repeat TFT in 4 weeks and follow-up with her endocrinologist.She follows with Dr Jacolyn Reedy [phone] will try to touch base with her  Macrocytic anemia: Hemoglobin currently stable in in the range of 9-10.  Normal vitamin 123456, folic acid level.  Hypertension: Continue home regimen.  On metoprolol, amlodipine, clonidine, isosorbide mononitrate  History of weight  loss/poor appetite: We have consulted dietitian.  She was noticed to be pocketing the food in the mouth.  Currently she is  on dysphagia 1 diet.  Speech following  History of diabetes type 2/peripheral neuropathy: HbA1c of 7.Has history of diabetic neuropathy.  On Lantus, Humalog, Trulicity at home.  Continue to monitor blood sugars.  Continue current insulin regimen.  Gabapentin on hold due to persistent confusion  CKD stage IIIb: Currently kidney function at baseline/normal.  Hyperlipidemia: On Lipitor  GERD: On Protonix  Chronic pain syndrome: Continue supportive care, pain management  Severe vitamin D deficiency: Vitamin D of just 8, started on aggressive supplementation    Nutrition Problem: Inadequate oral intake Etiology: poor appetite      DVT prophylaxis:Lovenox Code Status:Full  Family Communication: Discussed with husband/daughter at bedside on 04/20/2021  status is: Inpatient  Remains inpatient appropriate because:Inpatient level of care appropriate due to severity of illness  Dispo: The patient is from: Home              Anticipated d/c is to: Home with home health              Patient currently is not medically stable to d/c.   Difficult to place patient No     Consultants: Orthopedics  Procedures: ORIF  Antimicrobials:  Anti-infectives (From admission, onward)    Start     Dose/Rate Route Frequency Ordered Stop   04/16/21 2100  clindamycin (CLEOCIN) IVPB 900 mg       Note to Pharmacy: Please give 8 hours from intraoperative dose   900 mg 100 mL/hr over 30 Minutes Intravenous Every 8 hours 04/16/21 1619 04/17/21 1511   04/16/21 1344  vancomycin (VANCOCIN) powder  Status:  Discontinued          As needed 04/16/21 1344 04/16/21 1412   04/16/21 1230  clindamycin (CLEOCIN) IVPB 900 mg        900 mg 100 mL/hr over 30 Minutes Intravenous  Once 04/16/21 1225 04/16/21 1307   04/16/21 1226  clindamycin (CLEOCIN) 900 MG/50ML IVPB       Note to Pharmacy: Cameron Sprang   : cabinet override      04/16/21 1226 04/16/21 1323       Subjective:  Patient seen and examined at  the bedside this morning.  Appears more alert today.  Still confused but more coherent, obeys commands, communicates.  Daughter and husband at the bedside.  I had a long discussion with them.  They wanted to take her home with home health eventually.  They are very interested to talk to neurology about the patients mental status   Objective: Vitals:   04/19/21 1910 04/19/21 1941 04/20/21 0523 04/20/21 0948  BP:  134/75 (!) 156/82 (!) 154/83  Pulse:  92 87 88  Resp:  17 19   Temp:  98.1 F (36.7 C) 98 F (36.7 C)   TempSrc:      SpO2: 100% 100% 100%   Weight:      Height:        Intake/Output Summary (Last 24 hours) at 04/20/2021 1251 Last data filed at 04/20/2021 0945 Gross per 24 hour  Intake 243 ml  Output 300 ml  Net -57 ml   Filed Weights   04/16/21 1141 04/17/21 0750 04/18/21 0500  Weight: 111.6 kg 111.6 kg 113.4 kg    Examination:  General exam: Overall comfortable, not in distress,obese HEENT: PERRL Respiratory system:  no wheezes or crackles  Cardiovascular system: S1 &  S2 heard, RRR.  Gastrointestinal system: Abdomen is nondistended, soft and nontender. Central nervous system: Alert and awake ,oriented to place only Extremities: No edema, no clubbing ,no cyanosis Skin: No rashes, no ulcers,no icterus       Data Reviewed: I have personally reviewed following labs and imaging studies  CBC: Recent Labs  Lab 04/15/21 0235 04/15/21 0733 04/16/21 0242 04/17/21 0415 04/18/21 0141 04/20/21 0458  WBC 11.1*  --  11.0* 13.9* 10.7* 10.8*  NEUTROABS 7.5  --   --  10.9*  --  7.0  HGB 9.8* 10.9* 10.1* 9.5* 8.3* 8.9*  HCT 30.2* 33.3* 32.0* 30.2* 26.0* 28.7*  MCV 101.3*  --  100.3* 102.0* 100.8* 103.2*  PLT 346  --  373 366 322 0000000   Basic Metabolic Panel: Recent Labs  Lab 04/15/21 0235 04/16/21 0242 04/17/21 0415 04/18/21 0141 04/20/21 0458  NA 139 137 136 136 138  K 3.7 3.6 3.9 3.6 3.7  CL 109 105 107 105 106  CO2 22 21* 18* 19* 20*  GLUCOSE 138* 153*  147* 124* 140*  BUN '15 12 12 11 20  '$ CREATININE 1.16* 1.07* 1.01* 0.95 1.08*  CALCIUM 9.6 9.9 9.3 8.9 9.9   GFR: Estimated Creatinine Clearance: 60 mL/min (A) (by C-G formula based on SCr of 1.08 mg/dL (H)). Liver Function Tests: Recent Labs  Lab 04/15/21 0235  AST 14*  ALT 13  ALKPHOS 84  BILITOT 1.2  PROT 7.0  ALBUMIN 3.1*   No results for input(s): LIPASE, AMYLASE in the last 168 hours. Recent Labs  Lab 04/17/21 0415  AMMONIA 35   Coagulation Profile: No results for input(s): INR, PROTIME in the last 168 hours. Cardiac Enzymes: No results for input(s): CKTOTAL, CKMB, CKMBINDEX, TROPONINI in the last 168 hours. BNP (last 3 results) No results for input(s): PROBNP in the last 8760 hours. HbA1C: No results for input(s): HGBA1C in the last 72 hours.  CBG: Recent Labs  Lab 04/19/21 1258 04/19/21 1544 04/19/21 1939 04/20/21 0742 04/20/21 1142  GLUCAP 182* 217* 174* 149* 147*   Lipid Profile: No results for input(s): CHOL, HDL, LDLCALC, TRIG, CHOLHDL, LDLDIRECT in the last 72 hours. Thyroid Function Tests: No results for input(s): TSH, T4TOTAL, FREET4, T3FREE, THYROIDAB in the last 72 hours.  Anemia Panel: No results for input(s): VITAMINB12, FOLATE, FERRITIN, TIBC, IRON, RETICCTPCT in the last 72 hours.  Sepsis Labs: No results for input(s): PROCALCITON, LATICACIDVEN in the last 168 hours.  Recent Results (from the past 240 hour(s))  Resp Panel by RT-PCR (Flu A&B, Covid) Nasopharyngeal Swab     Status: None   Collection Time: 04/14/21 12:51 AM   Specimen: Nasopharyngeal Swab; Nasopharyngeal(NP) swabs in vial transport medium  Result Value Ref Range Status   SARS Coronavirus 2 by RT PCR NEGATIVE NEGATIVE Final    Comment: (NOTE) SARS-CoV-2 target nucleic acids are NOT DETECTED.  The SARS-CoV-2 RNA is generally detectable in upper respiratory specimens during the acute phase of infection. The lowest concentration of SARS-CoV-2 viral copies this assay can  detect is 138 copies/mL. A negative result does not preclude SARS-Cov-2 infection and should not be used as the sole basis for treatment or other patient management decisions. A negative result may occur with  improper specimen collection/handling, submission of specimen other than nasopharyngeal swab, presence of viral mutation(s) within the areas targeted by this assay, and inadequate number of viral copies(<138 copies/mL). A negative result must be combined with clinical observations, patient history, and epidemiological information. The expected result is  Negative.  Fact Sheet for Patients:  EntrepreneurPulse.com.au  Fact Sheet for Healthcare Providers:  IncredibleEmployment.be  This test is no t yet approved or cleared by the [place] FDA and  has been authorized for detection and/or diagnosis of SARS-CoV-2 by FDA under an Emergency Use Authorization (EUA). This EUA will remain  in effect (meaning this test can be used) for the duration of the COVID-19 declaration under Section 564(b)(1) of the Act, 21 U.S.C.section 360bbb-3(b)(1), unless the authorization is terminated  or revoked sooner.       Influenza A by PCR NEGATIVE NEGATIVE Final   Influenza B by PCR NEGATIVE NEGATIVE Final    Comment: (NOTE) The Xpert Xpress SARS-CoV-2/FLU/RSV plus assay is intended as an aid in the diagnosis of influenza from Nasopharyngeal swab specimens and should not be used as a sole basis for treatment. Nasal washings and aspirates are unacceptable for Xpert Xpress SARS-CoV-2/FLU/RSV testing.  Fact Sheet for Patients: EntrepreneurPulse.com.au  Fact Sheet for Healthcare Providers: IncredibleEmployment.be  This test is not yet approved or cleared by the [place] FDA and has been authorized for detection and/or diagnosis of SARS-CoV-2 by FDA under an Emergency Use Authorization (EUA). This EUA will remain in  effect (meaning this test can be used) for the duration of the COVID-19 declaration under Section 564(b)(1) of the Act, 21 U.S.C. section 360bbb-3(b)(1), unless the authorization is terminated or revoked.  Performed at [facility], [place] [address] [place], [place] [address]   Urine culture     Status: Abnormal   Collection Time: 04/15/21  6:10 AM   Specimen: Urine, Random  Result Value Ref Range Status   Specimen Description URINE, RANDOM  Final   Special Requests NONE  Final   Culture (A)  Final    <10,000 COLONIES/mL INSIGNIFICANT GROWTH Performed at [facility], [place] [address], [place],  [address]    Report Status 04/16/2021 FINAL  Final  MRSA Next Gen by PCR, Nasal     Status: None   Collection Time: 04/16/21 11:09 AM   Specimen: Nasal Mucosa; Nasal Swab  Result Value Ref Range Status   MRSA by PCR Next Gen NOT DETECTED NOT DETECTED Final    Comment: (NOTE) The GeneXpert MRSA Assay (FDA approved for NASAL specimens only), is one component of a comprehensive MRSA colonization surveillance program. It is not intended to diagnose MRSA infection nor to guide or monitor treatment for MRSA infections. Test performance is not FDA approved in patients less than 60 years old. Performed at [facility], [place] [address] [place],  [address]          Radiology Studies: CT HEAD WO CONTRAST  Result Date: 04/18/2021 CLINICAL DATA:  Delirium. EXAM: CT HEAD WITHOUT CONTRAST TECHNIQUE: Contiguous axial images were obtained from the base of the skull through the vertex without intravenous contrast. COMPARISON:  CT head without contrast 04/14/2021. FINDINGS: Brain: Mild atrophy is stable, within normal limits for age. No acute infarct, hemorrhage, or mass lesion is present. The ventricles are of normal size. No significant extraaxial fluid collection is present. The brainstem and cerebellum are within normal limits. Vascular: Minimal atherosclerotic changes  are present. No hyperdense vessel is present. Skull: Calvarium is intact. No focal lytic or blastic lesions are present. There is some motion the lower images. No significant extracranial soft tissue lesion is present. Sinuses/Orbits: The paranasal sinuses and mastoid air cells are clear. Globes are not well seen due to patient motion. IMPRESSION: Normal CT  appearance of the brain for age. Electronically Signed   By: San Morelle M.D.   On: 04/18/2021 16:07        Scheduled Meds:  amLODipine  10 mg Oral Daily   aspirin EC  81 mg Oral Daily   atorvastatin  40 mg Oral QHS   Chlorhexidine Gluconate Cloth  6 each Topical Q0600   cloNIDine  0.1 mg Oral BID   docusate sodium  100 mg Oral BID   enoxaparin (LOVENOX) injection  40 mg Subcutaneous Q24H   feeding supplement (GLUCERNA SHAKE)  237 mL Oral TID BM   insulin aspart  0-15 Units Subcutaneous TID WC   insulin glargine  20 Units Subcutaneous Daily   isosorbide mononitrate  60 mg Oral Daily   metoprolol succinate  50 mg Oral Daily   multivitamin with minerals  1 tablet Oral Daily   pantoprazole  40 mg Oral BID   sodium chloride flush  3 mL Intravenous Q12H   Vitamin D (Ergocalciferol)  50,000 Units Oral Q7 days   Continuous Infusions:     LOS: 5 days    Time spent: 25 mins,More than 50% of that time was spent in counseling and/or coordination of care.      Shelly Coss, MD [facility] P7/15/2022, 12:51 PM

## 2021-04-20 NOTE — Progress Notes (Signed)
Pt husband is upset bc he would like pt to get pain meds. Pt has no IV access .He would like the pt to be given morphine IM since she has no IV access.  I advised I could not do that bc I do not have orders. This was explained to pt . I advised pt that I could give her a PO med and once IV was placed, I would give her pain med via IV.IV order placed.   Husbands stated he has been asking for pain meds since 1700 but pain meds are q6 and can no be given before hand. Husband is very rude and demanding despite being educated on policy and procedures.

## 2021-04-20 NOTE — Telephone Encounter (Signed)
Left voicemail on home phone for family to call back in regards to [facility] forms.....  Also called mobile number and left voicemail

## 2021-04-20 NOTE — TOC Progression Note (Addendum)
Transition of Care [facility]) - Progression Note    Patient Details  Name: CANDIA KINGSBURY MRN: [id number] Date of Birth: [birth year]  [facility] CM/SW Contact  Joanne Chars, LCSW Phone Number: 04/20/2021, 1:24 PM  Clinical Narrative:  CSW met with pt, husband, daughter in room.  Husband has decided on taking pt home with [facility], still wanting to confirm agency could come 5x week and CSW reviewed what we had already discussed: this is possible, but is based on the evaluation and has to be shown medically necessary, husband verbalizes understanding.  Husband agreeable to [facility] as discussed previously.  Husband reports pt has walker and rollator at home, they need 3n1.  CSW spoke with Stacie at [facility] who accepts for [facility].        Expected Discharge Plan: Skilled Nursing Facility Barriers to Discharge: Continued Medical Work up, SNF Pending bed offer  Expected Discharge Plan and Services Expected Discharge Plan: [place] In-house Referral: Clinical Social Work   Post Acute Care Choice: [place] Living arrangements for the past 2 months: Single Family Home                           HH Arranged: PT, OT, Nurse's Aide HH Agency: [place] Date [place]: 04/20/21 Time [place]: [place] Representative spoke with at [place]: [place] ([place]) Interventions    Readmission Risk Interventions No flowsheet data found.

## 2021-04-20 NOTE — Progress Notes (Signed)
NEUROLOGY PROGRESS NOTE  Subjective: Patients husband is at bedside, he states that patient is more alert and that her mental status has improved slightly. He feels that she is not back at her baseline.   [name] herself states that she does not feel well due to pain- pain in her hands and pain to her right leg where she had recent ORIF.   Exam: Vitals:   04/19/21 1941 04/20/21 0523  BP: 134/75 (!) 156/82  Pulse: 92 87  Resp: 17 19  Temp: 98.1 F (36.7 C) 98 F (36.7 C)  SpO2: 100% 100%   Physical Exam  Constitutional: Appears well-developed and well-nourished.  HENT: No OP obstrucion Cardiovascular: Normal rate and regular rhythm.  Respiratory: Effort normal, non-labored breathing Neuro:  Mental Status: Alert, oriented to self, hospital ( unaware which one) and year with choices. She does not know the month with choices given to her. Follows simple commands such as closing her eyes, makes fist/opens it, giving a thumbs up. She can not follow the command to show two fingers.  Cranial Nerves: EOMI, VFF, Slight left facial weakness, tongue midline, shoulder shrug intact  Motor: Normal bulk and tone. BUE +4/5, LLE 4/5 hip flexor, 3/5 knee flexion and extension, 2/5 dorsi and plantar flexion- poor effort noted with give way weakness. RLE strength testing not done due to pain, she is able to wiggle toes to the right foot  Sensory: Intact to light touch throughout  Cerebellar: Intact FNF  Gait: Deferred due to bilateral leg weakness    Medications:  I have reviewed the patient's current medications.  Pertinent Labs and Imaging   04/18/21 CT Head WO Contrast  Normal CT appearance of the brain for age.  Assessment and Recommendations:  [name] is a 70 year old female w/pmh of anxiety, HTN, HLD, CAD, DM II, hypothyroidism, chronic back pain, and GERD who presents with chest pressure. Neurology was consulted for the evaluation and treatment of encephalopathy given prior to  admission she was noted by family to be confused at home.  Encephalopathy this admission is felt to be multifactorial given relatively recent adjustment of her thyroid medications, being prescribed Tizanidine in the past month and also taking post op narcotics due to ORIF on 04/16/21.   Today she is alert however requires choices to answer LOC questions and still is not aware of the month. Has some difficulty following commands. Her husband at bedside feels that she is improving in regards to her alertness/mental status.   Further discussed her presentation with her husband; he states that at the beginning of the month he noted her to be forgetful which was a change from her baseline. She was able to do all ADLS aside from driving prior to the two weeks leading up to her hospitalization- stopped driving in June. Given forgetfulness and inability to drive would recommend MCI work up on an outpatient basis. She should follow up with neurology for MCI evaluation.   In addition to medications contributing to encephalopathy for which neurology was initially consulted, delirium can also be playing a role thus recommend delirium precautions.   Recommendations:  - Minimize narcotics and sedatives of any type  - Avoid anticholinergics  - Continue with Vitamin D replacement  - Primary team to continue to correct infection and metabolic derangements  - Avoid medications on the [place] list for elderly. [email or web address] - Delirium precautions - Fall precautions - Ambulatory referral placed to Neurology today   Delirium precautions:  Open blinds and  lights on during the day. Close blinds and turn off lights at bedtime.  Minimize the patient's confusion. Encourage mobility and self-care. Optimise nutrition, hydration and regular continence. Minimize risk of injury and agitation. Minimize use of antipsychotic medications. Monitor and respond to pain. Use night-time  strategies to promote sleep.  Neurology will sign off at this time, please contact via Amion with any questions.   Ruta Hinds, NP  [facility]  04/20/2021, 7:30 AM

## 2021-04-20 NOTE — Progress Notes (Signed)
Occupational Therapy Treatment Patient Details Name: [name] MRN: [id number] DOB: 1951/08/14 Today's Date: 04/20/2021    History of present illness [name] is a 70 y.o. female presenting to ED 7/9 with substernal chest pressure, recurrent falls, decreased p.o. intake, nausea and SOB. Imaging (+) R supracondylar distal femur fracture s/p ORIF 7/11 with WBAT. PMHx significant for DMII, CKD stage III-IV, CAD, HTN, HLD, hypothyroidism, depression/anxiety and chronic back pain s/p spinal cord stimulator.   OT comments  Pt seen for bed level bathing/self care session, pt presenting with husband and dtr in room. CNA present to assist with bathing tasks for safety. Pt responsive and alert throughout, difficulty expressive needs without assist from husband for cuing. Pt continues to require max +2 for rolling and repositioning at bed level in B directions, no reports or indications of pain but repeatedly stating 'I don't want to fall' even with rolling. Pt requiring hand over hand for sequencing grooming at bed level with HOB elevated in chair positioning. Dep at this time for toileting/bathing and dressing, which husband in room able to confirm that that level of performance is near baseline. At this time OT with concerns for pt and husband safety with plan for pt to return to home. Anticipate pt to benefit form lift for safety with transition in and out of bed d/t increased level of A and fear of falling. OT will continue to follow acutely, with current recommendation of +2 assist, 24hr S/A and SNF.    Follow Up Recommendations  SNF;Supervision/Assistance - 24 hour    Equipment Recommendations  Other (comment)    Recommendations for Other Services      Precautions / Restrictions         Mobility Bed Mobility Overal bed mobility: Needs Assistance Bed Mobility: Rolling Rolling: Max assist;+2 for physical assistance (x3 each directions for hygiene and repositioning)               Transfers                      Balance                                           ADL either performed or assessed with clinical judgement   ADL                               Toileting- Clothing Manipulation and Hygiene: Bed level;Cueing for sequencing;+2 for physical assistance         General ADL Comments: demo's need for max-dep all ADL's at bed level, hand over hand for grooming, UB dressing including donn/doff gown, and Dep at this time toileting/hygiene     Vision       Perception     Praxis      Cognition Arousal/Alertness: Awake/alert Behavior During Therapy: Flat affect Overall Cognitive Status: History of cognitive impairments - at baseline                                 General Comments: oriented to self, responsive to husband and dtr in room, decreased processing with slowed time for responses        Exercises     Shoulder Instructions  General Comments      Pertinent Vitals/ Pain       Pain Assessment: No/denies pain Faces Pain Scale: No hurt Pain Descriptors / Indicators: Grimacing;Moaning Pain Intervention(s): Limited activity within patient's tolerance  Home Living                                          Prior Functioning/Environment              Frequency  Min 2X/week        Progress Toward Goals  OT Goals(current goals can now be found in the care plan section)     Acute Rehab OT Goals Patient Stated Goal: none stated OT Goal Formulation: With family Time For Goal Achievement: 05/01/21 Potential to Achieve Goals: Good  Plan Discharge plan remains appropriate;Frequency remains appropriate    Co-evaluation                 AM-PAC OT "6 Clicks" Daily Activity     Outcome Measure   Help from another person eating meals?: Total Help from another person taking care of personal grooming?: Total Help from another person toileting,  which includes using toliet, bedpan, or urinal?: Total Help from another person bathing (including washing, rinsing, drying)?: Total Help from another person to put on and taking off regular upper body clothing?: Total Help from another person to put on and taking off regular lower body clothing?: Total 6 Click Score: 6    End of Session    OT Visit Diagnosis: Unsteadiness on feet (R26.81);Repeated falls (R29.6);Muscle weakness (generalized) (M62.81);Pain Pain - Right/Left: Right Pain - part of body: Leg;Arm   Activity Tolerance Patient limited by lethargy   Patient Left in bed;with call bell/phone within reach;with family/visitor present   Nurse Communication Mobility status    Time: [id number] OT Time Calculation (min): 32 min  Charges: OT General Charges $OT Visit: 1 Visit OT Treatments $Self Care/Home Management : 23-37 mins  Chauna Osoria OTR/L acute rehab services Office: [phone]   04/20/2021, 2:51 PM

## 2021-04-20 NOTE — Plan of Care (Signed)

## 2021-04-20 NOTE — Plan of Care (Signed)
  Problem: Education: Goal: Knowledge of General Education information will improve Description Including pain rating scale, medication(s)/side effects and non-pharmacologic comfort measures Outcome: Progressing   

## 2021-04-20 NOTE — Progress Notes (Addendum)
  Speech Language Pathology Treatment: Dysphagia  Patient Details Name: [name] MRN: [id number] DOB: [birth year] Today's Date: 04/20/2021 Time: [id number] SLP Time Calculation (min) (ACUTE ONLY): 23 min  Assessment / Plan / Recommendation Clinical Impression  [name] was seen for dysphagia treatment with her husband and daughter present. Sujata, RN reported that the [name] has demonstrated improved swallow function today. [name] was alert and cooperative during the session. She communicated verbally and prompting was not required for participation in the session. [name] was being fed lunch by her husband upon SLP's arrival and she tolerated puree solids and nectar thick liquids without symptoms of oropharyngeal dysphagia. [name] tolerated multiple trials of dysphagia 3, regular textures, and thin liquids (via cup and straw) without overt s/sx of aspiration. Mastication was mildly prolonged secondary to reduced dentition, but it was functional and oral clearance was adequate without significant oral residue. [name]'s diet will be advanced to dysphagia 3 solids and thin liquids at this time which, from her husband's description, appears to be her baseline. SLP will follow to assess tolerance of the advanced diet.    HPI HPI: [name] is a 70 y.o. female who presented with complaints of chest pressure, recurrent falls, decreased p.o. intake, nausea and SOB. CT head was normal. [name] found to have R supracondylar distal femur fracture; s/p ORIF on 04/16/21. Neurology suspected 7/13 that the [name]'s confusion and weakness are driven by her fluctuating thyroid lab results. PMH: hypertension, hyperlipidemia, CAD, DM type II, hypothyroidism, cervical radiculopathy, chronic back pain, anxiety, depression, and GERD.      SLP Plan  Continue with current plan of care       Recommendations  Diet recommendations: Dysphagia 3 (mechanical soft);Thin liquid Liquids provided via: Cup;Straw Medication Administration: Whole meds with  puree Supervision: Full supervision/cueing for compensatory strategies;Trained caregiver to feed patient Compensations: Minimize environmental distractions;Slow rate;Small sips/bites;Follow solids with liquid (check for pocketing) Postural Changes and/or Swallow Maneuvers: Seated upright 90 degrees                Follow up Recommendations: Skilled Nursing facility SLP Visit Diagnosis: Dysphagia, unspecified (R13.10) Plan: Continue with current plan of care       Denaja Verhoeven I. Hardin Negus, [place], [place] Office number [phone] Pager [place] 04/20/2021, 1:02 PM

## 2021-04-21 LAB — GLUCOSE, CAPILLARY
Glucose-Capillary: 203 mg/dL — ABNORMAL HIGH (ref 70–99)
Glucose-Capillary: 222 mg/dL — ABNORMAL HIGH (ref 70–99)
Glucose-Capillary: 238 mg/dL — ABNORMAL HIGH (ref 70–99)

## 2021-04-21 LAB — TSH: TSH: 0.384 u[IU]/mL (ref 0.350–4.500)

## 2021-04-21 LAB — T4, FREE: Free T4: 1.34 ng/dL — ABNORMAL HIGH (ref 0.61–1.12)

## 2021-04-21 MED ORDER — LEVOTHYROXINE SODIUM 50 MCG PO TABS
50.0000 ug | ORAL_TABLET | Freq: Every day | ORAL | Status: DC
  Administered 2021-04-21 – 2021-04-24 (×4): 50 ug via ORAL
  Filled 2021-04-21 (×4): qty 1

## 2021-04-21 NOTE — TOC Progression Note (Signed)
Transition of Care [facility]) - Progression Note    Patient Details  Name: [name] MRN: [id number] Date of Birth: May 07, 1951  Transition of Care [facility]) CM/SW Contact  Coralee [place], [place] Phone Number: 04/21/2021, 1:13 PM  Clinical Narrative:    CSW was notified by MD that pt's family is now considering SNF. CSW met at bedside with pt's husband who stated he did not want SNF he wanted HH 5x week. CSW informed him that typically [place] will be 3x week. He argued that the previous CSW had said he could. CSW stated she would follow up and make sure to get accurate information. Per previous CSW note he had told pt's husband that depending on the pt's assessment,  they could possibly qualify for more.  CSW consulted CM who confirmed with [place] that they advised pt may get a visit from several disciplines, possibly one everyday depending on scheduling, but they do not offer 5x a week PT. And it all depends on what is ordered and how the assessment goes. Pt asked what is guaranteed and CM advised him that 2x week PT is what is standard. He was upset by this and CSW stated that SNF is the only way we can guarantee 5x week rehab. CSW and CM [name] were present when this information was given and MD aware of the [facility] constraints. Pt asked CSW to follow up with more [facility] agencies, CSW advised him that this is across the board and we would get the same answers.  Pt's husband stated he would consider SNF and would like to have bed offers. He stated that he has no preference and she could go anywhere as long as it is a good facility. CSW advised him of Medicare.gov and he stated he would review it. He noted he wants 3 stars and above and wants to be able to visit whenever he wants. CSW will put on referral that pt would like maximized visitation, but CSW should follow up with facilities that make bed offers to see what their visitation policy is. Pt has had covid vaccines and a booster.   Pt's husband stated  they still may decide to take her home, but they would need equipment and more assistance.CSW will send out referral's and provide bed offers as they become available. TOC will continue to follow for DC needs.   Expected Discharge Plan: Skilled Nursing Facility Barriers to Discharge: Continued Medical Work up, SNF Pending bed offer  Expected Discharge Plan and Services Expected Discharge Plan: [place] In-house Referral: Clinical Social Work   Post Acute Care Choice: [place] Living arrangements for the past 2 months: Single Family Home                           HH Arranged: PT, OT, Nurse's Aide HH Agency: [place] Date [place]: 04/20/21 Time [place]: [place] Representative spoke with at [place]: [place] Interventions    Readmission Risk Interventions No flowsheet data found.

## 2021-04-21 NOTE — Progress Notes (Signed)
PROGRESS NOTE    [name]  [id number] DOB: 1950/11/17 DOA: 04/14/2021 PCP: Biagio Borg, MD   Chief Complain: Chest pressure  Brief Narrative:  Patient is a 70 year old female with history of hypertension, hyperlipidemia, coronary artery disease, diabetes type 2, hypothyroidism, cervical radiculopathy, chronic back pain, anxiety, depression, GERD who presented with chest pressure from home.  Recently, she was found to be weak, falling frequently, confused, fatigued, had weight loss.  On presentation she was hemodynamically stable.  CT head and CT cervical spine did not show any acute abnormalities.  Chest x-ray did not show any pneumonia.  Lab work showed mild leukocytosis.  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  Orthopedics consulted and she underwent  ORIF on 04/16/21.  PT/OT recommending skilled nursing facility on discharge.  Neurology consulted for persistent confusion. Now pending disposition.  Family still not  decided on home health or skilled nursing facility.  Assessment & Plan:   Principal Problem:   Weakness Active Problems:   HLD (hyperlipidemia)   Macrocytic anemia   Hypothyroidism   Left lower quadrant abdominal pain   CKD (chronic kidney disease), stage III (HCC)   Leg pain, lateral, right   Acute metabolic encephalopathy   Recurrent falls   Type 2 diabetes mellitus (HCC)   Weight loss   Leukocytosis   Iatrogenic thyrotoxicosis   Generalized weakness/recurrent falls/right distal femur fracture: Lives with husband at home.  Found to be weak, needing assistance with ambulation, confusion.  CT head/cervical spine did not show any acute fractures.  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  Orthopedics consulted and underwent  ORIF on 04/16/21. Continue pain management, supportive care.  PT/OT evaluation done.   Recommended skilled nursing facility.Family  were interested to take her home with home health, but husband again interested on skilled nursing facility.  TOC made aware. On lovenox for dvt ppx  Acute metabolic encephalopathy: Patient found to be confused since last 3 to 4 weeks at home.  CT head did not show any acute abnormalities.  Cundt have MRI of the brain due to neurostimulator in her back.  Continue to monitor mental status. Normal ammonia,pending  vitamin B1.Normal Vitamin 123456 ,folic acid. She was on high dose of gabapentin which has been decreased.  We will try to minimize sedatives.  Keep the room bright with day sunlight.  Husband denies any history of dementia.  Husband thinks that high level of thyroid hormone could have contributed to that. Repeat CT head without any acute findings .She remains confused.  Last CT showed generalized atrophy, chronic microvascular changes. We have also discontinued gabapentin. Neurology signed off saying follow-up as an outpatient..  Abdominal pain/recent  history of diverticulitis: She was recently treated for acute diverticulitis twice, in May and June.  Abdomen/pelvis CT didn't show any evidence of bowel wall thickening, distention, or inflammatory changes. Showed colonic diverticulosis without evidence of acute diverticulitis.  History of hypothyroidism/now hyperthyroid: Low TSH, elevated free T4 on admission.  Takes levothyroxine at home which was held for now.  She follows with Dr Jacolyn Reedy [phone] discussed with her.  TFT done today showed normal TSH, mildly elevated T4.  Started on low-dose Synthyroid at 50 mcg as discussed with Dr. Heber Damascus.  Dr. Burney Gauze will call for follow-up as soon as possible.  Macrocytic anemia: Hemoglobin currently stable in in the range of 9-10.  Normal vitamin 123456, folic acid level.  Hypertension: Continue home regimen.  On metoprolol, amlodipine, clonidine, isosorbide mononitrate  History of  weight loss/poor appetite: We have consulted dietitian.  She was noticed to be pocketing the food in the mouth.  Currently she is on dysphagia 3 diet.  Speech following  History of diabetes type 2/peripheral neuropathy: HbA1c of 7.Has history of diabetic neuropathy.  On Lantus, Humalog, Trulicity at home.  Continue to monitor blood sugars.  Continue current insulin regimen.  Gabapentin on hold due to persistent confusion  CKD stage IIIb: Currently kidney function at baseline/normal.  Hyperlipidemia: On Lipitor  GERD: On Protonix  Chronic pain syndrome: Continue supportive care, pain management  Severe vitamin D deficiency: Vitamin D of just 8, started on aggressive supplementation    Nutrition Problem: Inadequate oral intake Etiology: poor appetite      DVT prophylaxis:Lovenox Code Status:Full  Family Communication: Discussed with husband/daughter at bedside on 04/20/2021  status is: Inpatient  Remains inpatient appropriate because:Inpatient level of care appropriate due to severity of illness  Dispo: The patient is from: Home              Anticipated d/c is to: Home with home health vs SNF              Patient currently is medically stable for discharge   Difficult to place patient No     Consultants: Orthopedics  Procedures: ORIF  Antimicrobials:  Anti-infectives (From admission, onward)    Start     Dose/Rate Route Frequency Ordered Stop   04/16/21 2100  clindamycin (CLEOCIN) IVPB 900 mg       Note to Pharmacy: Please give 8 hours from intraoperative dose   900 mg 100 mL/hr over 30 Minutes Intravenous Every 8 hours 04/16/21 1619 04/17/21 1511   04/16/21 1344  vancomycin (VANCOCIN) powder  Status:  Discontinued          As needed 04/16/21 1344 04/16/21 1412   04/16/21 1230  clindamycin (CLEOCIN) IVPB 900 mg        900 mg 100 mL/hr over 30 Minutes Intravenous  Once 04/16/21 1225 04/16/21 1307   04/16/21 1226  clindamycin (CLEOCIN) 900 MG/50ML IVPB       Note to  Pharmacy: Cameron Sprang   : cabinet override      04/16/21 1226 04/16/21 1323       Subjective:  Patient seen and examined the bedside this morning.  Hemodynamically stable.  Husband at the bedside.  She is alert, awake and obeys commands but still confused and has not deteriorated.  Husband and interested about skilled nursing facility idea.  Patient is not in any kind of pain   Objective: Vitals:   04/20/21 0705 04/20/21 0948 04/20/21 2128 04/21/21 0500  BP:  (!) 154/83 90/72 (!) 177/89  Pulse:  88 100 99  Resp:   20 18  Temp:   99 F (37.2 C) 98.6 F (37 C)  TempSrc:   Oral Oral  SpO2: 96%  100% 100%  Weight:      Height:        Intake/Output Summary (Last 24 hours) at 04/21/2021 1154 Last data filed at 04/21/2021 0500 Gross per 24 hour  Intake 250 ml  Output 550 ml  Net -300 ml   Filed Weights   04/16/21 1141 04/17/21 0750 04/18/21 0500  Weight: 111.6 kg 111.6 kg 113.4 kg    Examination:  General exam: Overall comfortable, not in distress,obese HEENT: PERRL Respiratory system:  no wheezes or crackles  Cardiovascular system: S1 & S2 heard, RRR.  Gastrointestinal system: Abdomen is nondistended, soft and nontender. Central nervous system: Alert and oriented Extremities: No edema, no clubbing ,no cyanosis, scars on the right hip from recent surgery Skin: No rashes, no ulcers,no icterus     Data Reviewed: I have personally reviewed following labs and imaging studies  CBC: Recent Labs  Lab 04/15/21 0235 04/15/21 0733 04/16/21 0242 04/17/21 0415 04/18/21 0141 04/20/21 0458  WBC 11.1*  --  11.0* 13.9* 10.7* 10.8*  NEUTROABS 7.5  --   --  10.9*  --  7.0  HGB 9.8* 10.9* 10.1* 9.5* 8.3* 8.9*  HCT 30.2* 33.3* 32.0* 30.2* 26.0* 28.7*  MCV 101.3*  --  100.3* 102.0* 100.8* 103.2*  PLT 346  --  373 366 322 0000000   Basic Metabolic Panel: Recent Labs  Lab 04/15/21 0235 04/16/21 0242 04/17/21 0415 04/18/21 0141 04/20/21 0458  NA 139 137 136 136 138  K 3.7  3.6 3.9 3.6 3.7  CL 109 105 107 105 106  CO2 22 21* 18* 19* 20*  GLUCOSE 138* 153* 147* 124* 140*  BUN '15 12 12 11 20  '$ CREATININE 1.16* 1.07* 1.01* 0.95 1.08*  CALCIUM 9.6 9.9 9.3 8.9 9.9   GFR: Estimated Creatinine Clearance: 60 mL/min (A) (by C-G formula based on SCr of 1.08 mg/dL (H)). Liver Function Tests: Recent Labs  Lab 04/15/21 0235  AST 14*  ALT 13  ALKPHOS 84  BILITOT 1.2  PROT 7.0  ALBUMIN 3.1*   No results for input(s): LIPASE, AMYLASE in the last 168 hours. Recent Labs  Lab 04/17/21 0415  AMMONIA 35   Coagulation Profile: No results for input(s): INR, PROTIME in the last 168 hours. Cardiac Enzymes: No results for input(s): CKTOTAL, CKMB, CKMBINDEX, TROPONINI in the last 168 hours. BNP (last 3 results) No results for input(s): PROBNP in the last 8760 hours. HbA1C: No results for input(s): HGBA1C in the last 72 hours.  CBG: Recent Labs  Lab 04/20/21 0742 04/20/21 1142 04/20/21 1626 04/20/21 2123 04/21/21 0756  GLUCAP 149* 147* 263* 174* 203*   Lipid Profile: No results for input(s): CHOL, HDL, LDLCALC, TRIG, CHOLHDL, LDLDIRECT in the last 72 hours. Thyroid Function Tests: Recent Labs    04/21/21 0243  TSH 0.384  FREET4 1.34*    Anemia Panel: No results for input(s): VITAMINB12, FOLATE, FERRITIN, TIBC, IRON, RETICCTPCT in the last 72 hours.  Sepsis Labs: No results for input(s): PROCALCITON, LATICACIDVEN in the last 168 hours.  Recent Results (from the past 240 hour(s))  Resp Panel by RT-PCR (Flu A&B, Covid) Nasopharyngeal Swab     Status: None   Collection Time: 04/14/21 12:51 AM   Specimen: Nasopharyngeal Swab; Nasopharyngeal(NP) swabs in vial transport medium  Result Value Ref Range Status   SARS Coronavirus 2 by RT PCR NEGATIVE NEGATIVE Final    Comment: (NOTE) SARS-CoV-2 target nucleic acids are NOT DETECTED.  The SARS-CoV-2 RNA is generally detectable in upper respiratory specimens during the acute phase of infection. The  lowest concentration of SARS-CoV-2 viral copies this assay can detect is 138 copies/mL. A negative result does not preclude SARS-Cov-2 infection and should not be used as the sole basis for treatment or other patient management decisions. A negative result may occur with  improper specimen collection/handling, submission of specimen other than nasopharyngeal swab, presence of viral mutation(s) within the areas targeted by this assay, and inadequate number of viral copies(<138 copies/mL). A negative result must be combined  with clinical observations, patient history, and epidemiological information. The expected result is Negative.  Fact Sheet for Patients:  EntrepreneurPulse.com.au  Fact Sheet for Healthcare Providers:  IncredibleEmployment.be  This test is no t yet approved or cleared by the [place] FDA and  has been authorized for detection and/or diagnosis of SARS-CoV-2 by FDA under an Emergency Use Authorization (EUA). This EUA will remain  in effect (meaning this test can be used) for the duration of the COVID-19 declaration under Section 564(b)(1) of the Act, 21 U.S.C.section 360bbb-3(b)(1), unless the authorization is terminated  or revoked sooner.       Influenza A by PCR NEGATIVE NEGATIVE Final   Influenza B by PCR NEGATIVE NEGATIVE Final    Comment: (NOTE) The Xpert Xpress SARS-CoV-2/FLU/RSV plus assay is intended as an aid in the diagnosis of influenza from Nasopharyngeal swab specimens and should not be used as a sole basis for treatment. Nasal washings and aspirates are unacceptable for Xpert Xpress SARS-CoV-2/FLU/RSV testing.  Fact Sheet for Patients: EntrepreneurPulse.com.au  Fact Sheet for Healthcare Providers: IncredibleEmployment.be  This test is not yet approved or cleared by the [place] FDA and has been authorized for detection and/or diagnosis of SARS-CoV-2 by FDA under  an Emergency Use Authorization (EUA). This EUA will remain in effect (meaning this test can be used) for the duration of the COVID-19 declaration under Section 564(b)(1) of the Act, 21 U.S.C. section 360bbb-3(b)(1), unless the authorization is terminated or revoked.  Performed at [facility], [place] [address], [place], [place] [address]   Urine culture     Status: Abnormal   Collection Time: 04/15/21  6:10 AM   Specimen: Urine, Random  Result Value Ref Range Status   Specimen Description URINE, RANDOM  Final   Special Requests NONE  Final   Culture (A)  Final    <10,000 COLONIES/mL INSIGNIFICANT GROWTH Performed at [facility], [place] [address], [place], [place] [address]    Report Status 04/16/2021 FINAL  Final  MRSA Next Gen by PCR, Nasal     Status: None   Collection Time: 04/16/21 11:09 AM   Specimen: Nasal Mucosa; Nasal Swab  Result Value Ref Range Status   MRSA by PCR Next Gen NOT DETECTED NOT DETECTED Final    Comment: (NOTE) The GeneXpert MRSA Assay (FDA approved for NASAL specimens only), is one component of a comprehensive MRSA colonization surveillance program. It is not intended to diagnose MRSA infection nor to guide or monitor treatment for MRSA infections. Test performance is not FDA approved in patients less than 62 years old. Performed at [facility], [place] [address], [place], [place] [address]          Radiology Studies: No results found.      Scheduled Meds:  amLODipine  10 mg Oral Daily   aspirin EC  81 mg Oral Daily   atorvastatin  40 mg Oral QHS   Chlorhexidine Gluconate Cloth  6 each Topical Q0600   cloNIDine  0.1 mg Oral BID   docusate sodium  100 mg Oral BID   enoxaparin (LOVENOX) injection  40 mg Subcutaneous Q24H   feeding supplement (GLUCERNA SHAKE)  237 mL Oral TID BM   insulin aspart  0-15 Units Subcutaneous TID WC   insulin glargine  20 Units Subcutaneous Daily   isosorbide mononitrate  60 mg Oral Daily    levothyroxine  50 mcg Oral Q0600   metoprolol succinate  50 mg Oral Daily   multivitamin  with minerals  1 tablet Oral Daily   pantoprazole  40 mg Oral BID   sodium chloride flush  3 mL Intravenous Q12H   Vitamin D (Ergocalciferol)  50,000 Units Oral Q7 days   Continuous Infusions:     LOS: 6 days    Time spent: 25 mins,More than 50% of that time was spent in counseling and/or coordination of care.      Shelly Coss, MD [facility] P7/16/2022, 11:54 AM

## 2021-04-22 LAB — GLUCOSE, CAPILLARY
Glucose-Capillary: 218 mg/dL — ABNORMAL HIGH (ref 70–99)
Glucose-Capillary: 233 mg/dL — ABNORMAL HIGH (ref 70–99)
Glucose-Capillary: 234 mg/dL — ABNORMAL HIGH (ref 70–99)
Glucose-Capillary: 217 mg/dL — ABNORMAL HIGH (ref 70–99)

## 2021-04-22 MED ORDER — INSULIN GLARGINE 100 UNIT/ML ~~LOC~~ SOLN
30.0000 [IU] | Freq: Every day | SUBCUTANEOUS | Status: DC
  Filled 2021-04-22: qty 0.3

## 2021-04-22 MED ORDER — INSULIN ASPART 100 UNIT/ML IJ SOLN
5.0000 [IU] | Freq: Three times a day (TID) | INTRAMUSCULAR | Status: DC
  Administered 2021-04-22 – 2021-04-23 (×2): 5 [IU] via SUBCUTANEOUS

## 2021-04-22 NOTE — TOC Progression Note (Addendum)
Transition of Care [facility]) - Progression Note    Patient Details  Name: [name] MRN: [id number] Date of Birth: [birth year]  Transition of Care [facility]) CM/SW [place], [place] Phone Number: 04/22/2021, 12:50 PM  Clinical Narrative:    CSW was requested by MD to meet with pt's husband in room, as he had questions about Medicaid. Pt's husband stated his daughter found equipment online including a hoyer and chair lift, and he was told CSW would put in orders to get those things. CSW attempted to explain that would be set up at the next venue, but husband was insistent, CM consulted for her expertise.  Pt's husband was given bed offers and was leaning towards [facility] stated they may have bed available tomorrow. CSW will request covid in order for them to be prepared for DC. SW will continue to follow for discharge needs.  CSW confirmed with [facility] that their visitation hours are between 10 and 6, but they do not enforce that. CSW asked CM to update family when she spoke with them.  Expected Discharge Plan: Skilled Nursing Facility Barriers to Discharge: Continued Medical Work up, SNF Pending bed offer  Expected Discharge Plan and Services Expected Discharge Plan: [place] In-house Referral: Clinical Social Work   Post Acute Care Choice: [place] Living arrangements for the past 2 months: Single Family Home                           HH Arranged: PT, OT, Nurse's Aide HH Agency: [place] Date [place]: 04/20/21 Time [place]: [place] Representative spoke with at [place]: [place] ([place]) Interventions    Readmission Risk Interventions No flowsheet data found.

## 2021-04-22 NOTE — Plan of Care (Signed)

## 2021-04-22 NOTE — Progress Notes (Signed)
PROGRESS NOTE    [name]  [id number] DOB: [birth year] DOA: 04/14/2021 PCP: Biagio Borg, MD   Chief Complain: Chest pressure  Brief Narrative:  Patient is a 70 year old female with history of hypertension, hyperlipidemia, coronary artery disease, diabetes type 2, hypothyroidism, cervical radiculopathy, chronic back pain, anxiety, depression, GERD who presented with chest pressure from home.  Recently, she was found to be weak, falling frequently, confused, fatigued, had weight loss.  On presentation she was hemodynamically stable.  CT head and CT cervical spine did not show any acute abnormalities.  Chest x-ray did not show any pneumonia.  Lab work showed mild leukocytosis.  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  Orthopedics consulted and she underwent  ORIF on 04/16/21.  PT/OT recommending skilled nursing facility on discharge.  Neurology consulted for persistent confusion, now signed off recommending outpatient follow-up Now pending disposition.  Family interested on skilled nursing facility.  Patient is medically stable for discharge to skilled nursing facility as soon as bed is available.  Assessment & Plan:   Principal Problem:   Weakness Active Problems:   HLD (hyperlipidemia)   Macrocytic anemia   Hypothyroidism   Left lower quadrant abdominal pain   CKD (chronic kidney disease), stage III (HCC)   Leg pain, lateral, right   Acute metabolic encephalopathy   Recurrent falls   Type 2 diabetes mellitus (HCC)   Weight loss   Leukocytosis   Iatrogenic thyrotoxicosis   Generalized weakness/recurrent falls/right distal femur fracture: Lives with husband at home.  Found to be weak, needing assistance with ambulation, confusion.  CT head/cervical spine did not show any acute fractures.  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   Orthopedics consulted and underwent  ORIF on 04/16/21. Continue pain management, supportive care.  PT/OT evaluation done.  Recommended skilled nursing facility.Family  were interested to take her home with home health, but husband again interested on skilled nursing facility.  TOC made aware. On lovenox for dvt ppx  Acute metabolic encephalopathy: Patient found to be confused since last 3 to 4 weeks at home.  CT head did not show any acute abnormalities.  Cundt have MRI of the brain due to neurostimulator in her back.  Continue to monitor mental status. Normal ammonia,pending  vitamin B1.Normal Vitamin 123456 ,folic acid. She was on high dose of gabapentin which has been decreased.  We will try to minimize sedatives.  Keep the room bright with day sunlight.  Husband denies any history of dementia.  Husband thinks that high level of thyroid hormone could have contributed to that. Repeat CT head without any acute findings .She remains confused.  Last CT showed generalized atrophy, chronic microvascular changes. We have also discontinued gabapentin. Neurology signed off saying follow-up as an outpatient..  Abdominal pain/recent  history of diverticulitis: She was recently treated for acute diverticulitis twice, in May and June.  Abdomen/pelvis CT didn't show any evidence of bowel wall thickening, distention, or inflammatory changes. Showed colonic diverticulosis without evidence of acute diverticulitis.  History of hypothyroidism/now hyperthyroid: Low TSH, elevated free T4 on admission.  Takes levothyroxine at home which was held for now.  She follows with Dr Jacolyn Reedy [phone] discussed with her.  TFT done today showed normal TSH, mildly elevated T4.  Started on low-dose Synthyroid at 50 mcg as discussed with Dr. Heber [place].  Dr. Burney Gauze will call for follow-up  as soon as possible.  Macrocytic anemia: Hemoglobin currently stable in in the range of 9-10.  Normal vitamin 123456, folic acid  level.  Hypertension: Continue home regimen.  On metoprolol, amlodipine, clonidine, isosorbide mononitrate  History of weight loss/poor appetite: We have consulted dietitian.  She was noticed to be pocketing the food in the mouth.  Currently she is on dysphagia 3 diet.  Speech following  History of diabetes type 2/peripheral neuropathy: HbA1c of 7.Has history of diabetic neuropathy.  On Lantus, Humalog, Trulicity at home.  Continue to monitor blood sugars.  Continue current insulin regimen.  Gabapentin on hold due to persistent confusion  CKD stage IIIb: Currently kidney function at baseline/normal.  Hyperlipidemia: On Lipitor  GERD: On Protonix  Chronic pain syndrome: Continue supportive care, pain management  Severe vitamin D deficiency: Vitamin D of just 8, started on aggressive supplementation    Nutrition Problem: Inadequate oral intake Etiology: poor appetite      DVT prophylaxis:Lovenox Code Status:Full  Family Communication: Discussed with husband at bedside daily status is: Inpatient  Remains inpatient appropriate because:Inpatient level of care appropriate due to severity of illness  Dispo: The patient is from: Home              Anticipated d/c is to: Home with home health vs SNF              Patient currently is medically stable for discharge   Difficult to place patient No     Consultants: Orthopedics  Procedures: ORIF  Antimicrobials:  Anti-infectives (From admission, onward)    Start     Dose/Rate Route Frequency Ordered Stop   04/16/21 2100  clindamycin (CLEOCIN) IVPB 900 mg       Note to Pharmacy: Please give 8 hours from intraoperative dose   900 mg 100 mL/hr over 30 Minutes Intravenous Every 8 hours 04/16/21 1619 04/17/21 1511   04/16/21 1344  vancomycin (VANCOCIN) powder  Status:  Discontinued          As needed 04/16/21 1344 04/16/21 1412   04/16/21 1230  clindamycin (CLEOCIN) IVPB 900 mg        900 mg 100 mL/hr over 30 Minutes Intravenous   Once 04/16/21 1225 04/16/21 1307   04/16/21 1226  clindamycin (CLEOCIN) 900 MG/50ML IVPB       Note to Pharmacy: Cameron Sprang   : cabinet override      04/16/21 1226 04/16/21 1323       Subjective:  Patient seen and examined the bedside this morning.  Hemodynamically stable.  Overall looks comfortable.  No new changes.  Mental status might have improved since yesterday.  She is still mostly bedbound.  Objective: Vitals:   04/21/21 2145 04/22/21 0321 04/22/21 0454 04/22/21 1157  BP: (!) 149/92  (!) 148/122 (!) 133/99  Pulse: (!) 107  (!) 109 (!) 101  Resp: 20   17  Temp: 99.3 F (37.4 C)  98.8 F (37.1 C) 98.6 F (37 C)  TempSrc: Oral   Oral  SpO2: 100%   100%  Weight:  113.4 kg    Height:        Intake/Output Summary (Last 24 hours) at 04/22/2021 1226 Last data filed at 04/22/2021 0809 Gross per 24 hour  Intake --  Output 800 ml  Net -800 ml   Filed Weights   04/17/21 0750 04/18/21 0500 04/22/21 0321  Weight: 111.6 kg 113.4 kg 113.4 kg    Examination:  General exam: Overall comfortable, not in distress,obese  HEENT: PERRL Respiratory system:  no wheezes or crackles  Cardiovascular system: S1 & S2 heard, RRR.  Gastrointestinal system: Abdomen is nondistended, soft and nontender. Central nervous system: Alert and awake Extremities: No edema, no clubbing ,no cyanosis Skin: No rashes, no ulcers,no icterus     Data Reviewed: I have personally reviewed following labs and imaging studies  CBC: Recent Labs  Lab 04/16/21 0242 04/17/21 0415 04/18/21 0141 04/20/21 0458  WBC 11.0* 13.9* 10.7* 10.8*  NEUTROABS  --  10.9*  --  7.0  HGB 10.1* 9.5* 8.3* 8.9*  HCT 32.0* 30.2* 26.0* 28.7*  MCV 100.3* 102.0* 100.8* 103.2*  PLT 373 366 322 0000000   Basic Metabolic Panel: Recent Labs  Lab 04/16/21 0242 04/17/21 0415 04/18/21 0141 04/20/21 0458  NA 137 136 136 138  K 3.6 3.9 3.6 3.7  CL 105 107 105 106  CO2 21* 18* 19* 20*  GLUCOSE 153* 147* 124* 140*  BUN '12  12 11 20  '$ CREATININE 1.07* 1.01* 0.95 1.08*  CALCIUM 9.9 9.3 8.9 9.9   GFR: Estimated Creatinine Clearance: 60 mL/min (A) (by C-G formula based on SCr of 1.08 mg/dL (H)). Liver Function Tests: No results for input(s): AST, ALT, ALKPHOS, BILITOT, PROT, ALBUMIN in the last 168 hours.  No results for input(s): LIPASE, AMYLASE in the last 168 hours. Recent Labs  Lab 04/17/21 0415  AMMONIA 35   Coagulation Profile: No results for input(s): INR, PROTIME in the last 168 hours. Cardiac Enzymes: No results for input(s): CKTOTAL, CKMB, CKMBINDEX, TROPONINI in the last 168 hours. BNP (last 3 results) No results for input(s): PROBNP in the last 8760 hours. HbA1C: No results for input(s): HGBA1C in the last 72 hours.  CBG: Recent Labs  Lab 04/21/21 0756 04/21/21 1250 04/21/21 1535 04/22/21 0810 04/22/21 1200  GLUCAP 203* 222* 238* 218* 233*   Lipid Profile: No results for input(s): CHOL, HDL, LDLCALC, TRIG, CHOLHDL, LDLDIRECT in the last 72 hours. Thyroid Function Tests: Recent Labs    04/21/21 0243  TSH 0.384  FREET4 1.34*    Anemia Panel: No results for input(s): VITAMINB12, FOLATE, FERRITIN, TIBC, IRON, RETICCTPCT in the last 72 hours.  Sepsis Labs: No results for input(s): PROCALCITON, LATICACIDVEN in the last 168 hours.  Recent Results (from the past 240 hour(s))  Resp Panel by RT-PCR (Flu A&B, Covid) Nasopharyngeal Swab     Status: None   Collection Time: 04/14/21 12:51 AM   Specimen: Nasopharyngeal Swab; Nasopharyngeal(NP) swabs in vial transport medium  Result Value Ref Range Status   SARS Coronavirus 2 by RT PCR NEGATIVE NEGATIVE Final    Comment: (NOTE) SARS-CoV-2 target nucleic acids are NOT DETECTED.  The SARS-CoV-2 RNA is generally detectable in upper respiratory specimens during the acute phase of infection. The lowest concentration of SARS-CoV-2 viral copies this assay can detect is 138 copies/mL. A negative result does not preclude  SARS-Cov-2 infection and should not be used as the sole basis for treatment or other patient management decisions. A negative result may occur with  improper specimen collection/handling, submission of specimen other than nasopharyngeal swab, presence of viral mutation(s) within the areas targeted by this assay, and inadequate number of viral copies(<138 copies/mL). A negative result must be combined with clinical observations, patient history, and epidemiological information. The expected result is Negative.  Fact Sheet for Patients:  EntrepreneurPulse.com.au  Fact Sheet for Healthcare Providers:  IncredibleEmployment.be  This test is no t yet approved or cleared by the [place] FDA and  has been  authorized for detection and/or diagnosis of SARS-CoV-2 by FDA under an Emergency Use Authorization (EUA). This EUA will remain  in effect (meaning this test can be used) for the duration of the COVID-19 declaration under Section 564(b)(1) of the Act, 21 U.S.C.section 360bbb-3(b)(1), unless the authorization is terminated  or revoked sooner.       Influenza A by PCR NEGATIVE NEGATIVE Final   Influenza B by PCR NEGATIVE NEGATIVE Final    Comment: (NOTE) The Xpert Xpress SARS-CoV-2/FLU/RSV plus assay is intended as an aid in the diagnosis of influenza from Nasopharyngeal swab specimens and should not be used as a sole basis for treatment. Nasal washings and aspirates are unacceptable for Xpert Xpress SARS-CoV-2/FLU/RSV testing.  Fact Sheet for Patients: EntrepreneurPulse.com.au  Fact Sheet for Healthcare Providers: IncredibleEmployment.be  This test is not yet approved or cleared by the [place] FDA and has been authorized for detection and/or diagnosis of SARS-CoV-2 by FDA under an Emergency Use Authorization (EUA). This EUA will remain in effect (meaning this test can be used) for the duration of  the COVID-19 declaration under Section 564(b)(1) of the Act, 21 U.S.C. section 360bbb-3(b)(1), unless the authorization is terminated or revoked.  Performed at [facility], [place] [address] [place], [place] [address]   Urine culture     Status: Abnormal   Collection Time: 04/15/21  6:10 AM   Specimen: Urine, Random  Result Value Ref Range Status   Specimen Description URINE, RANDOM  Final   Special Requests NONE  Final   Culture (A)  Final    <10,000 COLONIES/mL INSIGNIFICANT GROWTH Performed at [facility], [place] [address] [place], [place] [address]    Report Status 04/16/2021 FINAL  Final  MRSA Next Gen by PCR, Nasal     Status: None   Collection Time: 04/16/21 11:09 AM   Specimen: Nasal Mucosa; Nasal Swab  Result Value Ref Range Status   MRSA by PCR Next Gen NOT DETECTED NOT DETECTED Final    Comment: (NOTE) The GeneXpert MRSA Assay (FDA approved for NASAL specimens only), is one component of a comprehensive MRSA colonization surveillance program. It is not intended to diagnose MRSA infection nor to guide or monitor treatment for MRSA infections. Test performance is not FDA approved in patients less than 57 years old. Performed at [facility], [place] [address], [place], [place] [address]          Radiology Studies: No results found.      Scheduled Meds:  amLODipine  10 mg Oral Daily   aspirin EC  81 mg Oral Daily   atorvastatin  40 mg Oral QHS   Chlorhexidine Gluconate Cloth  6 each Topical Q0600   cloNIDine  0.1 mg Oral BID   docusate sodium  100 mg Oral BID   enoxaparin (LOVENOX) injection  40 mg Subcutaneous Q24H   feeding supplement (GLUCERNA SHAKE)  237 mL Oral TID BM   insulin aspart  0-15 Units Subcutaneous TID WC   insulin aspart  5 Units Subcutaneous TID WC   [START ON 04/23/2021] insulin glargine  30 Units Subcutaneous Daily   isosorbide mononitrate  60 mg Oral Daily   levothyroxine  50 mcg Oral Q0600   metoprolol succinate  50  mg Oral Daily   multivitamin with minerals  1 tablet Oral Daily   pantoprazole  40 mg Oral BID   sodium chloride flush  3 mL Intravenous Q12H   Vitamin D (Ergocalciferol)  50,000 Units Oral  Q7 days   Continuous Infusions:     LOS: 7 days    Time spent: 25 mins,More than 50% of that time was spent in counseling and/or coordination of care.      Shelly Coss, MD [facility] P7/17/2022, 12:26 PM

## 2021-04-22 NOTE — TOC Progression Note (Addendum)
Transition of Care [facility]) - Progression Note    Patient Details  Name: [name] MRN: [id number] Date of Birth: 1951-08-06  Transition of Care [facility] CM/SW Contact  Carles Collet, RN Phone Number: 04/22/2021, 3:30 PM  Clinical Narrative:     Disposition to be determined by family by tomorrow. Spouse at bedside, aware that discharge is planned for tomorrow.  [facility] accepted for [facility] services if family chooses to go home. Spouse circles back today to asking if patient will get the same level of care at home as she would in the SNF. I explained to both the spouse and the daughter at bedside yesterday and today that frequency and scheduling of home visits will be determined at the fist home visit when patient is assessed.   DME Between him and daughter they provided list of DME needs. CM explained to them, as did CSW, that DME recommendations and orders would be fulfilled by SNF in time for her discharge to home. That if she were to go to facility the hospital would not order her DME.   CM obtained list of DME requests from spouse and daughter and reviewed them with spouse at beside.  They would like a hoyer. Explained that insurance covers the kind with a foot pump, and they would have to pay for a fully electric one. Spouse argued that I was wrong. Verified with [employer] that they would have at least a 20% copay for manual hydrolic hoyer and extra charge above that for electric hoyer. They would like a lift chair. Informed spouse that it would need to be ordered through PCP, and family would need to purchase at a retail store, and cost would not be 100% covered by insurance. Spouse and daughter insisting that we supply order and that it will be covered because they have "good insurance that covers everything." They would like a transport WC. He is unsure of a shower seat, CM informed him that shower steat not covered with Medicare, he did not like 3/1 stating it was too wide for the  shower.  Spouse states that his daughter is currently [facility] and that he will try to do so later today. Daughter mentioned that with patient's insurance they are able to get into any SNF facility and they only want a 5 star facility. CM reviewed with them that facilities would need to accept the patient and also have an available bed at the time of discharge, and CSW has reviewed choices of accepting facilities.  Spent over an hour on multiple visits to room today reorienting family on discharge process, and discharge choices.       Expected Discharge Plan: Skilled Nursing Facility Barriers to Discharge: Continued Medical Work up, SNF Pending bed offer  Expected Discharge Plan and Services Expected Discharge Plan: [place] In-house Referral: Clinical Social Work   Post Acute Care Choice: [place] Living arrangements for the past 2 months: Single Family Home                           HH Arranged: PT, OT, Nurse's Aide HH Agency: [place] Date [place]: 04/20/21 Time [place]: [place] Representative spoke with at [place]: [place] ([place]) Interventions    Readmission Risk Interventions No flowsheet data found.

## 2021-04-23 LAB — BASIC METABOLIC PANEL
Anion gap: 8 (ref 5–15)
BUN: 13 mg/dL (ref 8–23)
CO2: 23 mmol/L (ref 22–32)
Calcium: 9.7 mg/dL (ref 8.9–10.3)
Chloride: 103 mmol/L (ref 98–111)
Creatinine, Ser: 1.04 mg/dL — ABNORMAL HIGH (ref 0.44–1.00)
GFR, Estimated: 57 mL/min — ABNORMAL LOW (ref >60)
Glucose, Bld: 258 mg/dL — ABNORMAL HIGH (ref 70–99)
Potassium: 4.2 mmol/L (ref 3.5–5.1)
Sodium: 134 mmol/L — ABNORMAL LOW (ref 135–145)

## 2021-04-23 LAB — GLUCOSE, CAPILLARY
Glucose-Capillary: 269 mg/dL — ABNORMAL HIGH (ref 70–99)
Glucose-Capillary: 186 mg/dL — ABNORMAL HIGH (ref 70–99)
Glucose-Capillary: 222 mg/dL — ABNORMAL HIGH (ref 70–99)
Glucose-Capillary: 192 mg/dL — ABNORMAL HIGH (ref 70–99)

## 2021-04-23 LAB — CBC WITH DIFFERENTIAL/PLATELET
Abs Immature Granulocytes: 0.12 10*3/uL — ABNORMAL HIGH (ref 0.00–0.07)
Basophils Absolute: 0 10*3/uL (ref 0.0–0.1)
Basophils Relative: 0 %
Eosinophils Absolute: 0.4 10*3/uL (ref 0.0–0.5)
Eosinophils Relative: 3 %
HCT: 29.4 % — ABNORMAL LOW (ref 36.0–46.0)
Hemoglobin: 9.2 g/dL — ABNORMAL LOW (ref 12.0–15.0)
Immature Granulocytes: 1 %
Lymphocytes Relative: 23 %
Lymphs Abs: 2.8 10*3/uL (ref 0.7–4.0)
MCH: 32.4 pg (ref 26.0–34.0)
MCHC: 31.3 g/dL (ref 30.0–36.0)
MCV: 103.5 fL — ABNORMAL HIGH (ref 80.0–100.0)
Monocytes Absolute: 0.9 10*3/uL (ref 0.1–1.0)
Monocytes Relative: 8 %
Neutro Abs: 7.9 10*3/uL — ABNORMAL HIGH (ref 1.7–7.7)
Neutrophils Relative %: 65 %
Platelets: 399 10*3/uL (ref 150–400)
RBC: 2.84 MIL/uL — ABNORMAL LOW (ref 3.87–5.11)
RDW: 13.8 % (ref 11.5–15.5)
WBC: 12.2 10*3/uL — ABNORMAL HIGH (ref 4.0–10.5)
nRBC: 0.7 % — ABNORMAL HIGH (ref 0.0–0.2)

## 2021-04-23 LAB — T3, FREE: T3, Free: 1.5 pg/mL — ABNORMAL LOW (ref 2.0–4.4)

## 2021-04-23 MED ORDER — CLONIDINE HCL 0.2 MG PO TABS
0.2000 mg | ORAL_TABLET | Freq: Two times a day (BID) | ORAL | Status: DC
  Administered 2021-04-23 – 2021-04-24 (×4): 0.2 mg via ORAL
  Filled 2021-04-23 (×4): qty 1

## 2021-04-23 MED ORDER — INSULIN GLARGINE 100 UNIT/ML ~~LOC~~ SOLN
35.0000 [IU] | Freq: Every day | SUBCUTANEOUS | Status: DC
  Administered 2021-04-23: 15 [IU] via SUBCUTANEOUS
  Administered 2021-04-24: 35 [IU] via SUBCUTANEOUS
  Filled 2021-04-23 (×3): qty 0.35

## 2021-04-23 MED ORDER — INSULIN ASPART 100 UNIT/ML IJ SOLN
8.0000 [IU] | Freq: Three times a day (TID) | INTRAMUSCULAR | Status: DC
  Administered 2021-04-23 – 2021-04-24 (×4): 8 [IU] via SUBCUTANEOUS

## 2021-04-23 NOTE — Care Management Important Message (Signed)
Important Message  Patient Details  Name: [name] MRN: [id number] Date of Birth: 07-Oct-1951   Medicare Important Message Given:  Yes     Amika Tassin P Shawnetta Lein 04/23/2021, 1:28 PM

## 2021-04-23 NOTE — Plan of Care (Signed)

## 2021-04-23 NOTE — Progress Notes (Signed)
  Speech Language Pathology Treatment: Dysphagia  Patient Details Name: [name] MRN: [id number] DOB: June 30, 1951 Today's Date: 04/23/2021 Time: [id number] SLP Time Calculation (min) (ACUTE ONLY): 10 min  Assessment / Plan / Recommendation Clinical Impression  Pt seen for ongoing dysphagia management. Pts son at bedside, pt had just finished mechanical soft and thin liquid lunch. Pt alert, though slow to respond, not following all simple commands. Per RN, pt tolerating mechanical soft thin liquid diet okay and does best when family present and assisting with pt feeding. Assessed pt with regular snack and thin liquids. Pt with limited mastication of solid snack and exhibited velar residuals to maxillary dentures, liquid wash assisted to clear. Pt not appropriate for diet upgrade to regular at this time due to some altered mentation and missing lower dentures. Will continue to follow to ensure adequate tolerance of current diet. Per prior SLP notes, family not agreeable to puree diet in past.    HPI HPI: Pt is a 70 y.o. female who presented with complaints of chest pressure, recurrent falls, decreased p.o. intake, nausea and SOB. CT head was normal. Pt found to have R supracondylar distal femur fracture; s/p ORIF on 04/16/21. Neurology suspected 7/13 that the pt's confusion and weakness are driven by her fluctuating thyroid lab results. PMH: hypertension, hyperlipidemia, CAD, DM type II, hypothyroidism, cervical radiculopathy, chronic back pain, anxiety, depression, and GERD.      SLP Plan  Continue with current plan of care       Recommendations  Diet recommendations: Dysphagia 3 (mechanical soft);Thin liquid Liquids provided via: Cup;Straw Medication Administration: Whole meds with puree Supervision: Full supervision/cueing for compensatory strategies;Trained caregiver to feed patient Compensations: Minimize environmental distractions;Slow rate;Small sips/bites;Follow solids with  liquid Postural Changes and/or Swallow Maneuvers: Seated upright 90 degrees                Oral Care Recommendations: Oral care BID Follow up Recommendations: Skilled Nursing facility SLP Visit Diagnosis: Dysphagia, unspecified (R13.10) Plan: Continue with current plan of care       [place], CCC-SLP Acute Rehabilitation Services   04/23/2021, 1:37 PM

## 2021-04-23 NOTE — Progress Notes (Signed)
Physical Therapy Treatment Patient Details Name: [name] MRN: [id number] DOB: 1951/03/28 Today's Date: 04/23/2021    History of Present Illness [name] is a 70 y.o. female presenting to ED 7/9 with substernal chest pressure, recurrent falls, decreased p.o. intake, nausea and SOB. Imaging (+) R supracondylar distal femur fracture s/p ORIF 7/11 with WBAT. PMHx significant for DMII, CKD stage III-IV, CAD, HTN, HLD, hypothyroidism, depression/anxiety and chronic back pain s/p spinal cord stimulator.    PT Comments    Pt admitted with above diagnosis. Pt continues to make very slow progress. Pt with slightly active movement in quads today compared to last sessions, however still with overall little response to commands and decr participation overall. Pt tolerates tilt bed for 20 minutes but note very little active movement in LEs. Pt does not move the left LE that was not broken and repaired much to command.  Will continue to progress pt as able.  Spoke with the husband as he wants to take pt home and noted that they are making arrangements to take her home today possibly. Discussed hoyer lift and need for 2 persons assist and that HHPT would need to work with them on safe movement in the home.  Discussed this PT's recommendations for equipment as below with husband as well. Also reiterated that pt is total care currently and husband understands this. Pt currently with functional limitations due to balance and endurance deficits. Pt will benefit from skilled PT to increase their independence and safety with mobility to allow discharge to the venue listed below.      Follow Up Recommendations  SNF (HHPT, HHOT, HHaide and HHSW if husband takes pt home)     [facility] bed;Wheelchair (22x18 with anti tippers, desk armrests, and elevating legrests);Wheelchair cushion (22x18 pressure relieving cushion);Other (comment) (lift chair, hoyer lift)    Recommendations for  Other Services       Precautions / Restrictions Precautions Precautions: Fall Restrictions RLE Weight Bearing: Weight bearing as tolerated    Mobility  Bed Mobility               General bed mobility comments: Scooted pt up in bed with total assist of 2 at end of treatment Start Time: 1340 Angle: 50 degrees Total Minutes in Angle: 20 minutes Patient Response: Flat affect  Transfers Overall transfer level: Needs assistance Equipment used:  (Tilt bed) Transfers: Sit to/from Stand Sit to Stand: Total assist;+2 safety/equipment         General transfer comment: Able to tilt pt to 50 degrees with 132 lbs weight on pts LEs.  With multiple cues and assist, pt with slightly incr active movement of either LE with moving her right LE more than left LE. She was upright for at least 20 minutes. Tilted pt back to 30 degrees and got a little more activation of LEs than last visit. Also once in chair position at end of session, attempts at movement of LES mostly unsuccessful with pt squeezing hips a little and barely contracting LEs. Spent time getting pt in upright sitting in chair position.  Ambulation/Gait                 Stairs             Wheelchair Mobility    Modified Rankin (Stroke Patients Only)       Balance               Standing balance comment: Stood with total  support in Tilt bed at 50 degrees with straps in place.                            Cognition Arousal/Alertness: Awake/alert Behavior During Therapy: Flat affect Overall Cognitive Status: History of cognitive impairments - at baseline                                 General Comments: oriented to self, responsive to husband intermittently, decreased processing with slowed time for responses      Exercises General Exercises - Lower Extremity Quad Sets: Strengthening;Other (comment) (Pt performed 8 quad sets bilaterally and educated on performing exercise  daily.) Heel Slides: AAROM;Both;10 reps;Supine Straight Leg Raises: AROM;5 reps;Right;Other (comment);Strengthening (Pt demonstrated decreased strength (2-) and ROM (20 degrees) when performing straight leg raise. Required assistance to achieve 30 degrees of hip flexion.)    General Comments General comments (skin integrity, edema, etc.): Spouse present discussing pt going home today.      Pertinent Vitals/Pain Pain Assessment: No/denies pain Faces Pain Scale: No hurt    Home Living                      Prior Function            PT Goals (current goals can now be found in the care plan section) Acute Rehab PT Goals Patient Stated Goal: none stated Progress towards PT goals: Progressing toward goals    Frequency    Min 3X/week      PT Plan Current plan remains appropriate    Co-evaluation              AM-PAC PT "6 Clicks" Mobility   Outcome Measure  Help needed turning from your back to your side while in a flat bed without using bedrails?: Total Help needed moving from lying on your back to sitting on the side of a flat bed without using bedrails?: Total Help needed moving to and from a bed to a chair (including a wheelchair)?: Total Help needed standing up from a chair using your arms (e.g., wheelchair or bedside chair)?: Total Help needed to walk in hospital room?: Total Help needed climbing 3-5 steps with a railing? : Total 6 Click Score: 6    End of Session Equipment Utilized During Treatment: Gait belt Activity Tolerance: Patient limited by fatigue Patient left: in bed;with call bell/phone within reach;with family/visitor present (in chair position) Nurse Communication: Mobility status PT Visit Diagnosis: Repeated falls (R29.6);Muscle weakness (generalized) (M62.81);History of falling (Z91.81);Other abnormalities of gait and mobility (R26.89)     Time: [id number] PT Time Calculation (min) (ACUTE ONLY): 42 min  Charges:  $Therapeutic  Exercise: 8-22 mins $Therapeutic Activity: 23-37 mins                     Amanee Iacovelli M,PT Acute Rehab Services [phone] (pager)    Alvira Philips 04/23/2021, 3:45 PM

## 2021-04-23 NOTE — TOC Progression Note (Signed)
Transition of Care [facility]) - Progression Note    Patient Details  Name: [name] MRN: [id number] Date of Birth: 1951/06/18  Transition of Care [facility]) CM/SW Contact  Joanne Chars, LCSW Phone Number: 04/23/2021, 1:55 PM  Clinical Narrative:    CSW spoke with Lexine Baton at [facility] does not have any available beds today.  [place] possible beds would be Wednesday.  1100: CSW went to pt room to meet with husband, who was not there.  1315: CSW met with husband in room, daughter Crystal on speakerphone.  Husband had already heard that [facility] did not have beds and reports he wants to take pt home.  Does not want her admitted to any other facility.  Confirmed choice of [facility] for [place].    CSW spoke with Freda Munro at [facility] who confirmed the billing information listed in [place], case manager note from yesterday.  The lift chair would need to be ordered from the showroom, prices start at $1000, insurance would reimburse approx $300 for the motor--may be cheaper at a regular furniture store or at [employer], but insurance won't reimburse motor if bought there.  May still be cheaper.    CSW and husband then went over DME using that same information  Husband has printouts about his supplemental BCBS with part F that he says will cover all costs for DME.  Husband asked CSW to take this paperwork and then determine DME costs.  CSW declined and deferred to DME company.  Husband confirmed her wants electric hoyer lift.  We discussed lift chair--daughter has found options that are possible cheaper than what [employer] can do. Husband wants transport wheelchair, not regular chair, husband also wants 3n1.  Discussed that CSW will get this ordered and find out if it can be delivered today--if it can, will see about DC today.  CSW again spoke with Freda Munro at [facility], after some checking, they do have transport wheelchair and electric lift in stock, she is checking on delivery schedule.    CSW confirmed with  Stacie at [facility] and they are ready to move forward with [facility].        Expected Discharge Plan: Skilled Nursing Facility Barriers to Discharge: Continued Medical Work up, SNF Pending bed offer  Expected Discharge Plan and Services Expected Discharge Plan: [place] In-house Referral: Clinical Social Work   Post Acute Care Choice: [place] Living arrangements for the past 2 months: Single Family Home                           HH Arranged: PT, OT, Nurse's Aide HH Agency: [place] Date [place]: 04/20/21 Time [place]: [place] Representative spoke with at [place]: [place] ([place]) Interventions    Readmission Risk Interventions No flowsheet data found.

## 2021-04-23 NOTE — Progress Notes (Signed)
PROGRESS NOTE    [name]  [id number] DOB: [birth year] DOA: 04/14/2021 PCP: Biagio Borg, MD   Chief Complain: Chest pressure  Brief Narrative:  Patient is a 70 year old female with history of hypertension, hyperlipidemia, coronary artery disease, diabetes type 2, hypothyroidism, cervical radiculopathy, chronic back pain, anxiety, depression, GERD who presented with chest pressure from home.  Recently, she was found to be weak, falling frequently, confused, fatigued, had weight loss.  On presentation she was hemodynamically stable.  CT head and CT cervical spine did not show any acute abnormalities.  Chest x-ray did not show any pneumonia.  Lab work showed mild leukocytosis.  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  Orthopedics consulted and she underwent  ORIF on 04/16/21.  PT/OT recommending skilled nursing facility on discharge.  Neurology consulted for persistent confusion, now signed off recommending outpatient follow-up Now pending disposition.  Family interested on skilled nursing facility.  Patient is medically stable for discharge to skilled nursing facility as soon as bed is available or if not Home with Home health  Assessment & Plan:   Principal Problem:   Weakness Active Problems:   HLD (hyperlipidemia)   Macrocytic anemia   Hypothyroidism   Left lower quadrant abdominal pain   CKD (chronic kidney disease), stage III (HCC)   Leg pain, lateral, right   Acute metabolic encephalopathy   Recurrent falls   Type 2 diabetes mellitus (HCC)   Weight loss   Leukocytosis   Iatrogenic thyrotoxicosis   Generalized weakness/recurrent falls/right distal femur fracture: Lives with husband at home.  Found to be weak, needing assistance with ambulation, confusion.  CT head/cervical spine did not show any acute fractures.  X-ray of the right knee showed acute nondisplaced fracture of the distal femur of a  prosthetic knee replacement.  Orthopedics consulted and underwent  ORIF on 04/16/21. Continue pain management, supportive care.  PT/OT evaluation done.  Recommended skilled nursing facility.Family  were interested to take her home with home health, but husband again interested on skilled nursing facility.  TOC made aware. On lovenox for dvt ppx  Acute metabolic encephalopathy: Patient found to be confused since last 3 to 4 weeks at home.  CT head did not show any acute abnormalities.  Cundt have MRI of the brain due to neurostimulator in her back.  Continue to monitor mental status. Normal ammonia,pending  vitamin B1.Normal Vitamin 123456 ,folic acid. She was on high dose of gabapentin which has been decreased.  We will try to minimize sedatives.  Keep the room bright with day sunlight.  Husband denies any history of dementia.  Husband thinks that high level of thyroid hormone could have contributed to that. Repeat CT head without any acute findings .She remains confused.  Last CT showed generalized atrophy, chronic microvascular changes. We have also discontinued gabapentin. Neurology signed off saying follow-up as an outpatient..  Abdominal pain/recent  history of diverticulitis: She was recently treated for acute diverticulitis twice, in May and June.  Abdomen/pelvis CT didn't show any evidence of bowel wall thickening, distention, or inflammatory changes. Showed colonic diverticulosis without evidence of acute diverticulitis.  History of hypothyroidism/now hyperthyroid: Low TSH, elevated free T4 on admission.  Takes levothyroxine at home which was held for now.  She follows with Dr Jacolyn Reedy [phone] discussed with her.  TFT done today showed normal TSH, mildly elevated T4.  Started on low-dose Synthyroid at 50 mcg as discussed with Dr. Heber [place].  Dr. Burney Gauze will call for follow-up as soon as possible.  Macrocytic anemia: Hemoglobin currently stable in in the range of 9-10.   Normal vitamin 123456, folic acid level.  Hypertension: Continue home regimen.  On metoprolol, amlodipine, clonidine(dose increased), isosorbide mononitrate  History of weight loss/poor appetite: We have consulted dietitian.  She was noticed to be pocketing the food in the mouth.  Currently she is on dysphagia 3 diet.  Speech following  History of diabetes type 2/peripheral neuropathy: HbA1c of 7.Has history of diabetic neuropathy.  On Lantus, Humalog, Trulicity at home.  Continue to monitor blood sugars.  Continue current insulin regimen.  Gabapentin on hold due to persistent confusion  CKD stage IIIb: Currently kidney function at baseline/normal.  Hyperlipidemia: On Lipitor  GERD: On Protonix  Chronic pain syndrome: Continue supportive care, pain management  Severe vitamin D deficiency: Vitamin D of just 8, started on aggressive supplementation    Nutrition Problem: Inadequate oral intake Etiology: poor appetite      DVT prophylaxis:Lovenox Code Status:Full  Family Communication: Discussed with husband at bedside daily status is: Inpatient  Remains inpatient appropriate because:Inpatient level of care appropriate due to severity of illness  Dispo: The patient is from: Home              Anticipated d/c is to: Home with home health vs SNF              Patient currently is medically stable for discharge   Difficult to place patient No  Husband discussing with social worker about disposition.   Consultants: Orthopedics  Procedures: ORIF  Antimicrobials:  Anti-infectives (From admission, onward)    Start     Dose/Rate Route Frequency Ordered Stop   04/16/21 2100  clindamycin (CLEOCIN) IVPB 900 mg       Note to Pharmacy: Please give 8 hours from intraoperative dose   900 mg 100 mL/hr over 30 Minutes Intravenous Every 8 hours 04/16/21 1619 04/17/21 1511   04/16/21 1344  vancomycin (VANCOCIN) powder  Status:  Discontinued          As needed 04/16/21 1344 04/16/21 1412    04/16/21 1230  clindamycin (CLEOCIN) IVPB 900 mg        900 mg 100 mL/hr over 30 Minutes Intravenous  Once 04/16/21 1225 04/16/21 1307   04/16/21 1226  clindamycin (CLEOCIN) 900 MG/50ML IVPB       Note to Pharmacy: Cameron Sprang   : cabinet override      04/16/21 1226 04/16/21 1323       Subjective:  Patient seen and examined the bedside this morning.  Hemodynamically stable.  No changes from yesterday.  Husband at the bedside.  She is alert, awake, communicate but not oriented to time.  Objective: Vitals:   04/22/21 1157 04/22/21 2059 04/23/21 0428 04/23/21 1150  BP: (!) 133/99 (!) 150/95 (!) 179/98 (!) 149/81  Pulse: (!) 101 100 86 97  Resp: '17 18 18 14  '$ Temp: 98.6 F (37 C) 98.8 F (37.1 C) 98 F (36.7 C) 98.1 F (36.7 C)  TempSrc: Oral     SpO2: 100% 100% 100% 100%  Weight:      Height:        Intake/Output Summary (Last 24 hours) at 04/23/2021 1341 Last data filed at 04/22/2021 1544 Gross per 24 hour  Intake --  Output 250 ml  Net -250 ml   Filed Weights   04/17/21 0750 04/18/21 0500 04/22/21 0321  Weight: 111.6 kg 113.4 kg 113.4  kg    Examination: General exam: Overall comfortable, not in distress,obese HEENT: PERRL Respiratory system:  no wheezes or crackles  Cardiovascular system: S1 & S2 heard, RRR.  Gastrointestinal system: Abdomen is nondistended, soft and nontender. Central nervous system: Alert and awake but not oriented to time Extremities: No edema, no clubbing ,no cyanosis Skin: No rashes, no ulcers,no icterus      Data Reviewed: I have personally reviewed following labs and imaging studies  CBC: Recent Labs  Lab 04/17/21 0415 04/18/21 0141 04/20/21 0458 04/23/21 0806  WBC 13.9* 10.7* 10.8* 12.2*  NEUTROABS 10.9*  --  7.0 7.9*  HGB 9.5* 8.3* 8.9* 9.2*  HCT 30.2* 26.0* 28.7* 29.4*  MCV 102.0* 100.8* 103.2* 103.5*  PLT 366 322 353 [id number]   Basic Metabolic Panel: Recent Labs  Lab 04/17/21 0415 04/18/21 0141 04/20/21 0458  04/23/21 0806  NA 136 136 138 134*  K 3.9 3.6 3.7 4.2  CL 107 105 106 103  CO2 18* 19* 20* 23  GLUCOSE 147* 124* 140* 258*  BUN '12 11 20 13  '$ CREATININE 1.01* 0.95 1.08* 1.04*  CALCIUM 9.3 8.9 9.9 9.7   GFR: Estimated Creatinine Clearance: 62.3 mL/min (A) (by C-G formula based on SCr of 1.04 mg/dL (H)). Liver Function Tests: No results for input(s): AST, ALT, ALKPHOS, BILITOT, PROT, ALBUMIN in the last 168 hours.  No results for input(s): LIPASE, AMYLASE in the last 168 hours. Recent Labs  Lab 04/17/21 0415  AMMONIA 35   Coagulation Profile: No results for input(s): INR, PROTIME in the last 168 hours. Cardiac Enzymes: No results for input(s): CKTOTAL, CKMB, CKMBINDEX, TROPONINI in the last 168 hours. BNP (last 3 results) No results for input(s): PROBNP in the last 8760 hours. HbA1C: No results for input(s): HGBA1C in the last 72 hours.  CBG: Recent Labs  Lab 04/22/21 1200 04/22/21 1542 04/22/21 2056 04/23/21 0832 04/23/21 1209  GLUCAP 233* 234* 217* 269* 186*   Lipid Profile: No results for input(s): CHOL, HDL, LDLCALC, TRIG, CHOLHDL, LDLDIRECT in the last 72 hours. Thyroid Function Tests: Recent Labs    04/21/21 0243  TSH 0.384  FREET4 1.34*  T3FREE 1.5*    Anemia Panel: No results for input(s): VITAMINB12, FOLATE, FERRITIN, TIBC, IRON, RETICCTPCT in the last 72 hours.  Sepsis Labs: No results for input(s): PROCALCITON, LATICACIDVEN in the last 168 hours.  Recent Results (from the past 240 hour(s))  Resp Panel by RT-PCR (Flu A&B, Covid) Nasopharyngeal Swab     Status: None   Collection Time: 04/14/21 12:51 AM   Specimen: Nasopharyngeal Swab; Nasopharyngeal(NP) swabs in vial transport medium  Result Value Ref Range Status   SARS Coronavirus 2 by RT PCR NEGATIVE NEGATIVE Final    Comment: (NOTE) SARS-CoV-2 target nucleic acids are NOT DETECTED.  The SARS-CoV-2 RNA is generally detectable in upper respiratory specimens during the acute phase of  infection. The lowest concentration of SARS-CoV-2 viral copies this assay can detect is 138 copies/mL. A negative result does not preclude SARS-Cov-2 infection and should not be used as the sole basis for treatment or other patient management decisions. A negative result may occur with  improper specimen collection/handling, submission of specimen other than nasopharyngeal swab, presence of viral mutation(s) within the areas targeted by this assay, and inadequate number of viral copies(<138 copies/mL). A negative result must be combined with clinical observations, patient history, and epidemiological information. The expected result is Negative.  Fact Sheet for Patients:  EntrepreneurPulse.com.au  Fact Sheet for Healthcare Providers:  IncredibleEmployment.be  This test is no t yet approved or cleared by the [place] and  has been authorized for detection and/or diagnosis of SARS-CoV-2 by FDA under an Emergency Use Authorization (EUA). This EUA will remain  in effect (meaning this test can be used) for the duration of the COVID-19 declaration under Section 564(b)(1) of the Act, 21 U.S.C.section 360bbb-3(b)(1), unless the authorization is terminated  or revoked sooner.       Influenza A by PCR NEGATIVE NEGATIVE Final   Influenza B by PCR NEGATIVE NEGATIVE Final    Comment: (NOTE) The Xpert Xpress SARS-CoV-2/FLU/RSV plus assay is intended as an aid in the diagnosis of influenza from Nasopharyngeal swab specimens and should not be used as a sole basis for treatment. Nasal washings and aspirates are unacceptable for Xpert Xpress SARS-CoV-2/FLU/RSV testing.  Fact Sheet for Patients: EntrepreneurPulse.com.au  Fact Sheet for Healthcare Providers: IncredibleEmployment.be  This test is not yet approved or cleared by the [place] FDA and has been authorized for detection and/or diagnosis of SARS-CoV-2  by FDA under an Emergency Use Authorization (EUA). This EUA will remain in effect (meaning this test can be used) for the duration of the COVID-19 declaration under Section 564(b)(1) of the Act, 21 U.S.C. section 360bbb-3(b)(1), unless the authorization is terminated or revoked.  Performed at [facility], [place] [address], [place], [place] [address]   Urine culture     Status: Abnormal   Collection Time: 04/15/21  6:10 AM   Specimen: Urine, Random  Result Value Ref Range Status   Specimen Description URINE, RANDOM  Final   Special Requests NONE  Final   Culture (A)  Final    <10,000 COLONIES/mL INSIGNIFICANT GROWTH Performed at [facility], [place] [address], [place], [place] [address]    Report Status 04/16/2021 FINAL  Final  MRSA Next Gen by PCR, Nasal     Status: None   Collection Time: 04/16/21 11:09 AM   Specimen: Nasal Mucosa; Nasal Swab  Result Value Ref Range Status   MRSA by PCR Next Gen NOT DETECTED NOT DETECTED Final    Comment: (NOTE) The GeneXpert MRSA Assay (FDA approved for NASAL specimens only), is one component of a comprehensive MRSA colonization surveillance program. It is not intended to diagnose MRSA infection nor to guide or monitor treatment for MRSA infections. Test performance is not FDA approved in patients less than 57 years old. Performed at [facility], [place] [address] [place], [place] [address]          Radiology Studies: No results found.      Scheduled Meds:  amLODipine  10 mg Oral Daily   aspirin EC  81 mg Oral Daily   atorvastatin  40 mg Oral QHS   Chlorhexidine Gluconate Cloth  6 each Topical Q0600   cloNIDine  0.2 mg Oral BID   docusate sodium  100 mg Oral BID   enoxaparin (LOVENOX) injection  40 mg Subcutaneous Q24H   feeding supplement (GLUCERNA SHAKE)  237 mL Oral TID BM   insulin aspart  0-15 Units Subcutaneous TID WC   insulin aspart  8 Units Subcutaneous TID WC   insulin glargine  35 Units  Subcutaneous Daily   isosorbide mononitrate  60 mg Oral Daily   levothyroxine  50 mcg Oral Q0600   metoprolol succinate  50 mg Oral Daily   multivitamin with minerals  1 tablet Oral Daily   pantoprazole  40 mg Oral BID   sodium chloride  flush  3 mL Intravenous Q12H   Vitamin D (Ergocalciferol)  50,000 Units Oral Q7 days   Continuous Infusions:     LOS: 8 days    Time spent: 25 mins,More than 50% of that time was spent in counseling and/or coordination of care.      Shelly Coss, MD [facility] P7/18/2022, 1:41 PM

## 2021-04-23 NOTE — Consult Note (Signed)
   [facility] CM Inpatient Consult   04/23/2021  [name] 1950-12-30 [id number]  [place] [facility] [ACO] Patient: Medicare [name]  Primary Care Provider:  Biagio Borg, MD is listed [facility], [facility]  Patient screened for hospitalization with noted extreme high risk score for unplanned readmission risk and to assess for Embedded Care Management service needs for post hospital transition.  Review of patient's medical record reveals patient is being recommended for a skilled nursing facility level of care as reviewed of latest inpatient [facility] team and therapy notes.   04/24/21 1400:  Patient for home with [facility] per notes with DME. Came by to speak with patient and she was sound asleep. Will continue to follow for post hospital needs.  Plan:   Patient can be followed by Embedded team, will follow up on disposition.  For questions contact:   Natividad Brood, RN BSN [facility] Liaison  [phone] business mobile phone Toll free office [phone]  Fax number: [phone] [name]

## 2021-04-24 LAB — GLUCOSE, CAPILLARY
Glucose-Capillary: 157 mg/dL — ABNORMAL HIGH (ref 70–99)
Glucose-Capillary: 162 mg/dL — ABNORMAL HIGH (ref 70–99)
Glucose-Capillary: 70 mg/dL (ref 70–99)
Glucose-Capillary: 116 mg/dL — ABNORMAL HIGH (ref 70–99)

## 2021-04-24 MED ORDER — OXYCODONE-ACETAMINOPHEN 5-325 MG PO TABS
1.0000 | ORAL_TABLET | Freq: Once | ORAL | Status: AC
  Administered 2021-04-24: 1 via ORAL
  Filled 2021-04-24: qty 1

## 2021-04-24 MED ORDER — AMLODIPINE BESYLATE 10 MG PO TABS: 10.0000 mg | ORAL_TABLET | Freq: Every day | ORAL | 0 refills | Status: AC

## 2021-04-24 MED ORDER — LEVOTHYROXINE SODIUM 50 MCG PO TABS: 50.0000 ug | ORAL_TABLET | Freq: Every day | ORAL | 0 refills | Status: AC

## 2021-04-24 MED ORDER — ASPIRIN 81 MG PO TBEC: 81.0000 mg | DELAYED_RELEASE_TABLET | Freq: Two times a day (BID) | ORAL | 0 refills | Status: DC

## 2021-04-24 MED ORDER — DICLOFENAC SODIUM 1 % EX GEL
2.0000 g | Freq: Once | CUTANEOUS | Status: AC
  Administered 2021-04-24: 2 g via TOPICAL
  Filled 2021-04-24: qty 100

## 2021-04-24 MED ORDER — CLONIDINE HCL 0.2 MG PO TABS: 0.2000 mg | ORAL_TABLET | Freq: Two times a day (BID) | ORAL | 0 refills | Status: DC

## 2021-04-24 NOTE — Discharge Summary (Signed)
Physician Discharge Summary  [name] [id number] DOB: 1951-09-08 DOA: 04/14/2021  PCP: Biagio Borg, MD  Admit date: 04/14/2021 Discharge date: 04/24/2021  Admitted From: Home Disposition:  Home  Discharge Condition:Stable CODE STATUS:FULL Diet recommendation:Dysphagia 3  Brief/Interim Summary:  Patient is a 70 year old female with history of hypertension, hyperlipidemia, coronary artery disease, diabetes type 2, hypothyroidism, cervical radiculopathy, chronic back pain, anxiety, depression, GERD who presented with chest pressure from home.  Recently, she was found to be weak, falling frequently, confused, fatigued, had weight loss.  On presentation she was hemodynamically stable.  CT head and CT cervical spine did not show any acute abnormalities.  Chest x-ray did not show any pneumonia.  Lab work showed mild leukocytosis.  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  Orthopedics consulted and she underwent  ORIF on 04/16/21.  PT/OT recommending skilled nursing facility on discharge.  Neurology consulted for persistent confusion, now signed off recommending outpatient follow-up PT/OT recommended discussion was done discharge but family wanted to take her home.  Medically stable for discharge today.  She needs to follow-up with endocrinology, neurology, orthopedics as an outpatient.  Following problems were addressed during her hospitalization:  Generalized weakness/recurrent falls/right distal femur fracture: Lives with husband at home.  Found to be weak, needing assistance with ambulation, confusion.  CT head/cervical spine did not show any acute fractures.  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  Orthopedics consulted and underwent  ORIF on 04/16/21. Continue pain management, supportive care.  PT/OT evaluation done.  Recommended skilled nursing  facility.Family  were interested to take her home with home health. Patient is to follow-up with orthopedics in 2 weeks.  She has been started on aspirin 81 mg twice daily for DVT prophylaxis.  Sutures to be removed on follow-up with orthopedics.  Acute metabolic encephalopathy: Patient found to be confused since last 3 to 4 weeks at home.  CT head did not show any acute abnormalities.  Cundt have MRI of the brain due to neurostimulator in her back.  Continue to monitor mental status. Normal ammonia,pending  vitamin B1.Normal Vitamin 123456 ,folic acid. She was on high dose of gabapentin which has been decreased.  We will try to minimize sedatives.  Keep the room bright with day sunlight.  Husband denies any history of dementia.  Husband thinks that high level of thyroid hormone could have contributed to that. Repeat CT head without any acute findings .She remains confused.  Last CT showed generalized atrophy, chronic microvascular changes. We have also discontinued gabapentin. Neurology signed off saying follow-up as an outpatient..   Abdominal pain/recent  history of diverticulitis: She was recently treated for acute diverticulitis twice, in May and June.  Abdomen/pelvis CT didn't show any evidence of bowel wall thickening, distention, or inflammatory changes. Showed colonic diverticulosis without evidence of acute diverticulitis.   History of hypothyroidism/now hyperthyroid: Low TSH, elevated free T4 on admission.  Takes levothyroxine at home which was held for now.  She follows with Dr Jacolyn Reedy [phone] discussed with her.  TFT done today showed normal TSH, mildly elevated T4.  Started on low-dose Synthyroid at 50 mcg as discussed with Dr. Heber Ponderay.  Dr. Burney Gauze will call for follow-up as soon as possible.   Macrocytic anemia: Hemoglobin currently stable in in the range of 9-10.  Normal vitamin 123456, folic acid level.   Hypertension: Continue home regimen.  On metoprolol,  amlodipine, clonidine(dose increased), isosorbide mononitrate   History of weight loss/poor appetite:   Currently she is on dysphagia 3 diet.  Speech was following   History of diabetes type 2/peripheral neuropathy: HbA1c of 7.Has history of diabetic neuropathy.  On Lantus, Humalog, Trulicity at home.   CKD stage IIIb: Currently kidney function at baseline/normal.   Hyperlipidemia: On Lipitor   GERD: On Protonix   Chronic pain syndrome: Continue supportive care, pain management   Severe vitamin D deficiency: Vitamin D of just 8, started on aggressive supplementation.Check vitamin D level after finishing the course of high-dose vitamin          Discharge Diagnoses:  Principal Problem:   Weakness Active Problems:   HLD (hyperlipidemia)   Macrocytic anemia   Hypothyroidism   Left lower quadrant abdominal pain   CKD (chronic kidney disease), stage III (HCC)   Leg pain, lateral, right   Acute metabolic encephalopathy   Recurrent falls   Type 2 diabetes mellitus (HCC)   Weight loss   Leukocytosis   Iatrogenic thyrotoxicosis    Discharge Instructions  Discharge Instructions     Ambulatory referral to Neurology   Complete by: As directed    An appointment is requested in approximately: 4 weeks   Diet general   Complete by: As directed    Dysphagia 3   Discharge instructions   Complete by: As directed    1)Please take prescribed medications as instructed 2)Follow up with your endocrinologist as soon as possible.  Check thyroid function test in 4 weeks 3)Follow up with orthopedics in 2 weeks.  Name and number of the provider has been attached 4)Follow up with neurology in 2 weeks,name and number of the provider group has been attached 5)Follow up with your PCP in a week.  Do a CBC, BMP during the follow-up.   Increase activity slowly   Complete by: As directed    No wound care   Complete by: As directed       Allergies as of 04/24/2021       Reactions    Ciprofloxacin Shortness Of Breath, Other (See Comments)   Dizziness   Hydrocodone Shortness Of Breath, Other (See Comments)   Tachycardia   Hydrocortisone    Penicillins Hives   Pt just remembers hives, SHE HAS HAD AUGEMTIN WITHOUT ISSUES PER PATIENT no SOB or lightheadedness with hypotension: No Has patient had a PCN reaction causing severe rash involving mucus membranes or skin necrosis: No Has patient had a PCN reaction that required hospitalization No Has patient had a PCN reaction occurring within the last 10 years: No If all of the above answers are "NO", then may proceed with Cephalosporin use.        Medication List     STOP taking these medications    clindamycin 150 MG capsule Commonly known as: CLEOCIN   gabapentin 600 MG tablet Commonly known as: NEURONTIN   ondansetron 4 MG tablet Commonly known as: ZOFRAN   tiZANidine 2 MG tablet Commonly known as: ZANAFLEX       TAKE these medications    amLODipine 10 MG tablet Commonly known as: NORVASC Take 1 tablet (10 mg total) by mouth daily. Start taking on: April 25, 2021 What changed:  medication strength how much to take   aspirin 81 MG EC tablet Take 1 tablet (81 mg total) by mouth 2 (two) times daily. After one month,continue taking once a day What changed:  when to take this additional  instructions   atorvastatin 40 MG tablet Commonly known as: LIPITOR TAKE ONE TABLET BY MOUTH EVERY NIGHT AT BEDTIME   Basaglar KwikPen 100 UNIT/ML Inject 30 Units into the skin daily.   cloNIDine 0.2 MG tablet Commonly known as: CATAPRES Take 1 tablet (0.2 mg total) by mouth 2 (two) times daily. What changed:  medication strength how much to take   Elmiron 100 MG capsule Generic drug: pentosan polysulfate Take 200 mg by mouth 2 (two) times daily.   FLUoxetine 20 MG capsule Commonly known as: PROZAC TAKE ONE CAPSULE BY MOUTH DAILY What changed:  how much to take how to take this when to take this    FreeStyle Libre 2 Reader [facility] Use as directed twice daily E11.9   FreeStyle Libre 2 Sensor Misc Use as directed once bi-weekly E11.9   glucose blood test strip Commonly known as: FREESTYLE TEST STRIPS Use as instructed to check blood sugar 4 times daily.   glucose blood test strip Use as instructed   insulin lispro 100 UNIT/ML injection Commonly known as: HUMALOG Inject 3-20 Units into the skin See admin instructions. Per sliding scale 3 times daily   isosorbide mononitrate 60 MG 24 hr tablet Commonly known as: IMDUR Take 1 tablet (60 mg total) by mouth daily.   levothyroxine 50 MCG tablet Commonly known as: SYNTHROID Take 1 tablet (50 mcg total) by mouth daily at 6 (six) AM. Start taking on: April 25, 2021 What changed:  medication strength how much to take when to take this   linaclotide 72 MCG capsule Commonly known as: Linzess Take 1 capsule (72 mcg total) by mouth daily before breakfast. What changed:  when to take this reasons to take this   Lotemax SM 0.38 % Gel Generic drug: Loteprednol Etabonate Place 1 drop into both eyes 3 (three) times daily.   megestrol 40 MG tablet Commonly known as: MEGACE Take 1 tablet (40 mg total) by mouth daily.   metoprolol succinate 50 MG 24 hr tablet Commonly known as: TOPROL-XL TAKE 1 TABLET BY MOUTH DAILY WITH OR IMMEDIATELY FOLLOWING A MEAL What changed:  how much to take how to take this when to take this additional instructions   nitroGLYCERIN 0.4 MG SL tablet Commonly known as: NITROSTAT Place 1 tablet (0.4 mg total) under the tongue every 5 (five) minutes as needed for chest pain.   ondansetron 4 MG disintegrating tablet Commonly known as: Zofran ODT Take 1 tablet (4 mg total) by mouth every 8 (eight) hours as needed for up to 15 doses for nausea or vomiting.   pantoprazole 40 MG tablet Commonly known as: PROTONIX TAKE 1 TABLET(40 MG) BY MOUTH TWICE DAILY What changed:  how much to take how to take  this when to take this additional instructions   polyethylene glycol 17 g packet Commonly known as: MIRALAX / GLYCOLAX Take 17 g by mouth daily as needed. What changed: reasons to take this   traMADol 50 MG tablet Commonly known as: ULTRAM Take 1 tablet (50 mg total) by mouth every 6 (six) hours as needed for moderate pain or severe pain. What changed:  when to take this reasons to take this   Trulicity 1.5 0000000 Sopn Generic drug: Dulaglutide Inject 1.5 mg into the skin every Monday.   Vitamin D (Ergocalciferol) 1.25 MG (50000 UNIT) Caps capsule Commonly known as: DRISDOL Take 1 capsule (50,000 Units total) by mouth every 7 (seven) days. Start taking on: April 25, 2021  Durable Medical Equipment  (From admission, onward)           Start     Ordered   04/23/21 1408  For home use only DME Other see comment  Once       Comments: Fully electric hoyer lift  Question:  Length of Need  Answer:  6 Months   04/23/21 1407   04/23/21 1401  For home use only DME 3 n 1  Once        04/23/21 1400   04/23/21 1401  For home use only DME Other see comment  Once       Comments: Lift chair  Question:  Length of Need  Answer:  Lifetime   04/23/21 1400   04/23/21 1400  For home use only DME Other see comment  Once       Comments: Hoyer lift: fully electric  Question:  Length of Need  Answer:  Lifetime   04/23/21 1400   04/23/21 1400  For home use only DME Wheelchair electric  Once       Comments: Transport chair   04/23/21 1400            Follow-up Information     Haddix, Thomasene Lot, MD. Schedule an appointment as soon as possible for a visit in 2 week(s).   Specialty: Orthopedic Surgery Why: for repeat x-rays and wound check Contact information: [place] [address] [phone]         Biagio Borg, MD. Schedule an appointment as soon as possible for a visit in 1 week(s).   Specialties: Internal Medicine, Radiology Contact  information: [place] [address] [phone]         [facility]. Schedule an appointment as soon as possible for a visit in 2 week(s).   Specialty: Neurology Contact information: [address] [place], [place], [place]. Schedule an appointment as soon as possible for a visit in 3 day(s).   Specialty: Endocrinology Contact information: [place] [address] [phone]                Allergies  Allergen Reactions   Ciprofloxacin Shortness Of Breath and Other (See Comments)    Dizziness    Hydrocodone Shortness Of Breath and Other (See Comments)    Tachycardia   Hydrocortisone    Penicillins Hives    Pt just remembers hives, SHE HAS HAD AUGEMTIN WITHOUT ISSUES PER PATIENT no SOB or lightheadedness with hypotension: No Has patient had a PCN reaction causing severe rash involving mucus membranes or skin necrosis: No Has patient had a PCN reaction that required hospitalization No Has patient had a PCN reaction occurring within the last 10 years: No If all of the above answers are "NO", then may proceed with Cephalosporin use.     Consultations: Orthopedics, neurology   Procedures/Studies: CT ABDOMEN PELVIS WO CONTRAST  Result Date: 04/15/2021 CLINICAL DATA:  70 year old female with acute abdominal and pelvic pain. EXAM: CT ABDOMEN AND PELVIS WITHOUT CONTRAST TECHNIQUE: Multidetector CT imaging of the abdomen and pelvis was performed following the standard protocol without IV contrast. COMPARISON:  03/21/2021 and prior CTs FINDINGS: Please note that parenchymal abnormalities may be missed without intravenous contrast. Lower chest: No acute abnormality Hepatobiliary: The liver is unremarkable. The patient is status post cholecystectomy. No biliary dilatation. Pancreas: Unremarkable Spleen: Unremarkable Adrenals/Urinary Tract: The kidneys,  adrenal glands and bladder are unremarkable. Stable pelvic calcifications are noted. Stomach/Bowel: Gastric surgical changes are identified. Appendix appears normal. No evidence of bowel wall thickening, distention, or inflammatory changes. Colonic diverticulosis without evidence of acute diverticulitis noted. Vascular/Lymphatic: Aortic atherosclerosis. No enlarged abdominal or pelvic lymph nodes. Reproductive: Status post hysterectomy. No adnexal masses. Other: No ascites, focal collection or pneumoperitoneum. LEFT LATERAL abdominal wall hernia and LEFT inguinal hernia containing fat are unchanged. Musculoskeletal: No acute or suspicious bony abnormalities are identified. Posterior/interbody fusion changes from L3-L5 again noted. Neurostimulator in the LOWER thoracic region noted. IMPRESSION: 1. No evidence of acute abnormality. 2. Unchanged LEFT LATERAL abdominal wall hernia and LEFT inguinal hernia containing fat. 3. Aortic Atherosclerosis (ICD10-I70.0). Electronically Signed   By: Margarette Canada M.D.   On: 04/15/2021 12:39   DG Chest 2 View  Result Date: 04/14/2021 CLINICAL DATA:  Chest pain for 1 hour EXAM: CHEST - 2 VIEW COMPARISON:  11/09/2020 FINDINGS: The heart size and mediastinal contours are within normal limits. Both lungs are clear. The visualized skeletal structures are unremarkable. Spinal stimulator is noted in place. IMPRESSION: No active cardiopulmonary disease. Electronically Signed   By: Inez Catalina M.D.   On: 04/14/2021 22:23   DG Thoracic Spine 2 View  Result Date: 04/15/2021 CLINICAL DATA:  Recent falls with back pain, initial encounter EXAM: THORACIC SPINE 2 VIEWS COMPARISON:  None FINDINGS: Spinal stimulator is noted. Vertebral body height is well maintained. No pedicle abnormality or paraspinal mass is noted. No definitive rib abnormality is seen. IMPRESSION: No acute abnormality noted. Electronically Signed   By: Inez Catalina M.D.   On: 04/15/2021 00:01   DG Lumbar Spine  Complete  Result Date: 04/14/2021 CLINICAL DATA:  Frequent falls with low back pain, initial encounter EXAM: LUMBAR SPINE - COMPLETE 4+ VIEW COMPARISON:  04/05/2018 FINDINGS: Five lumbar type vertebral bodies are well visualized. Vertebral body height is well maintained. Changes of prior interbody fusion are noted at L3-4. L3-L5 fixation is noted pedicle screws and posterior fixation elements. Left lateral fixation of L3-4 is noted. Spinal stimulator is seen. No pars defects are noted. No compression deformity is noted. No soft tissue abnormality is seen. IMPRESSION: Postsurgical changes stable in appearance from the prior exam. No acute abnormality noted. Electronically Signed   By: Inez Catalina M.D.   On: 04/14/2021 23:59   DG Shoulder Right  Result Date: 04/17/2021 CLINICAL DATA:  Right shoulder pain EXAM: RIGHT SHOULDER - 2+ VIEW COMPARISON:  2010 images only FINDINGS: Remodeling and spurring at the acromioclavicular joint is chronic and was present 2010 likely reflecting sequelae of prior injury. There is minor spurring at the glenoid. No substantial narrowing of the glenohumeral joint. IMPRESSION: Minor degenerative changes at the glenohumeral joint. Chronic changes at the acromioclavicular joint. Electronically Signed   By: Macy Mis M.D.   On: 04/17/2021 16:16   DG Knee 1-2 Views Right  Result Date: 04/15/2021 CLINICAL DATA:  Bilateral leg weakness for the past 2 weeks. No known injury. EXAM: RIGHT KNEE - 1-2 VIEW COMPARISON:  Plain film of the RIGHT knee dated 02/03/2016. FINDINGS: Lucency within the distal RIGHT femur diaphysis, most suggestive of acute nondisplaced fracture line. Overall osseous alignment is normal. RIGHT knee arthroplasty hardware appears intact and appropriately positioned. IMPRESSION: 1. Acute-appearing nondisplaced fracture line within the distal RIGHT femur diaphysis. 2. RIGHT knee arthroplasty hardware appears intact and appropriately positioned. Electronically Signed    By: Franki Cabot M.D.   On: 04/15/2021 09:21   DG  Ankle Complete Right  Result Date: 04/15/2021 CLINICAL DATA:  Recent falls with right ankle pain, initial encounter EXAM: RIGHT ANKLE - COMPLETE 3+ VIEW COMPARISON:  None. FINDINGS: Mild tarsal degenerative changes are seen. Tibiotalar degenerative changes noted. No acute fracture or dislocation is seen. No soft tissue abnormality is noted. IMPRESSION: Tarsal degenerative changes without acute bony abnormality. Electronically Signed   By: Inez Catalina M.D.   On: 04/15/2021 00:00   CT HEAD WO CONTRAST  Result Date: 04/18/2021 CLINICAL DATA:  Delirium. EXAM: CT HEAD WITHOUT CONTRAST TECHNIQUE: Contiguous axial images were obtained from the base of the skull through the vertex without intravenous contrast. COMPARISON:  CT head without contrast 04/14/2021. FINDINGS: Brain: Mild atrophy is stable, within normal limits for age. No acute infarct, hemorrhage, or mass lesion is present. The ventricles are of normal size. No significant extraaxial fluid collection is present. The brainstem and cerebellum are within normal limits. Vascular: Minimal atherosclerotic changes are present. No hyperdense vessel is present. Skull: Calvarium is intact. No focal lytic or blastic lesions are present. There is some motion the lower images. No significant extracranial soft tissue lesion is present. Sinuses/Orbits: The paranasal sinuses and mastoid air cells are clear. Globes are not well seen due to patient motion. IMPRESSION: Normal CT appearance of the brain for age. Electronically Signed   By: San Morelle M.D.   On: 04/18/2021 16:07   CT Head Wo Contrast  Result Date: 04/14/2021 CLINICAL DATA:  70 year old female with neck pain. EXAM: CT HEAD WITHOUT CONTRAST CT CERVICAL SPINE WITHOUT CONTRAST TECHNIQUE: Multidetector CT imaging of the head and cervical spine was performed following the standard protocol without intravenous contrast. Multiplanar CT image  reconstructions of the cervical spine were also generated. COMPARISON:  Head CT dated 11/09/2020. FINDINGS: CT HEAD FINDINGS Brain: Mild age-related atrophy and chronic microvascular ischemic changes. There is no acute intracranial hemorrhage. No mass effect or midline shift no extra-axial fluid collection. Vascular: No hyperdense vessel or unexpected calcification. Skull: Normal. Negative for fracture or focal lesion. Sinuses/Orbits: The visualized paranasal sinuses and mastoid air cells are clear. Postsurgical changes of left maxillary sinus. Retained tooth in the left maxillary sinus as seen previously. Other: None CT CERVICAL SPINE FINDINGS Alignment: No acute subluxation Skull base and vertebrae: No acute fracture. Old-appearing compression of C5 and C6. Osteopenia. Soft tissues and spinal canal: No prevertebral fluid or swelling. No visible canal hematoma. Disc levels: Multilevel degenerative changes with disc space narrowing and endplate irregularity and spurring primarily at C6-C7. Upper chest: Negative. Other: None IMPRESSION: 1. No acute intracranial pathology. Mild age-related atrophy and chronic microvascular ischemic changes. 2. No acute/traumatic cervical spine pathology. Multilevel degenerative changes. Electronically Signed   By: Anner Crete M.D.   On: 04/14/2021 23:28   CT CERVICAL SPINE WO CONTRAST  Result Date: 04/14/2021 CLINICAL DATA:  70 year old female with neck pain. EXAM: CT HEAD WITHOUT CONTRAST CT CERVICAL SPINE WITHOUT CONTRAST TECHNIQUE: Multidetector CT imaging of the head and cervical spine was performed following the standard protocol without intravenous contrast. Multiplanar CT image reconstructions of the cervical spine were also generated. COMPARISON:  Head CT dated 11/09/2020. FINDINGS: CT HEAD FINDINGS Brain: Mild age-related atrophy and chronic microvascular ischemic changes. There is no acute intracranial hemorrhage. No mass effect or midline shift no extra-axial fluid  collection. Vascular: No hyperdense vessel or unexpected calcification. Skull: Normal. Negative for fracture or focal lesion. Sinuses/Orbits: The visualized paranasal sinuses and mastoid air cells are clear. Postsurgical changes of left maxillary sinus. Retained  tooth in the left maxillary sinus as seen previously. Other: None CT CERVICAL SPINE FINDINGS Alignment: No acute subluxation Skull base and vertebrae: No acute fracture. Old-appearing compression of C5 and C6. Osteopenia. Soft tissues and spinal canal: No prevertebral fluid or swelling. No visible canal hematoma. Disc levels: Multilevel degenerative changes with disc space narrowing and endplate irregularity and spurring primarily at C6-C7. Upper chest: Negative. Other: None IMPRESSION: 1. No acute intracranial pathology. Mild age-related atrophy and chronic microvascular ischemic changes. 2. No acute/traumatic cervical spine pathology. Multilevel degenerative changes. Electronically Signed   By: Anner Crete M.D.   On: 04/14/2021 23:28   CT Lumbar Spine Wo Contrast  Result Date: 04/15/2021 CLINICAL DATA:  Lumbar compression fracture EXAM: CT LUMBAR SPINE WITHOUT CONTRAST TECHNIQUE: Multidetector CT imaging of the lumbar spine was performed without intravenous contrast administration. Multiplanar CT image reconstructions were also generated. COMPARISON:  Radiography from yesterday FINDINGS: Segmentation: 5 lumbar type vertebrae Alignment: Normal Vertebrae: L3-L5 solid arthrodesis and no evident hardware complication. Spinal cord stimulator which enter the canal posteriorly at T12-L1. Paraspinal and other soft tissues: No evidence of hemorrhage or mass about the spine. Expected postoperative scarring. Disc levels: L2-3 adjacent segment ligamentous thickening and facet spurring with moderate spinal stenosis. L5-S1 mild disc narrowing and bulging. IMPRESSION: 1. No acute finding. 2. L3-L5 solid arthrodesis. 3. 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  Electronically Signed   By: Monte Fantasia M.D.   On: 04/15/2021 05:04   DG Knee Right Port  Result Date: 04/16/2021 CLINICAL DATA:  ORIF distal femoral fracture EXAM: PORTABLE RIGHT KNEE - 4 VIEW COMPARISON:  04/16/2021 fluoroscopic spot images, and knee radiographs from 04/15/2021 FINDINGS: Four views of the right knee demonstrate a lateral plate and screw fixator along the distal femur believed to be spanning the oblique and longitudinal nondisplaced fracture of the distal femur which extends into the lateral metaphysis. Four distal bicondylar screws and 1 or more proximal screws are present. Proximally and distally the plate appears to have close proximity to the cortex although there does appear to be a 5 mm gap between the plate and cortex at the distal metadiaphysis. A total knee prosthesis is observed. Heterotopic calcification along the distal quadriceps, chronic. IMPRESSION: Lateral ORIF of the distal femur fracture, with plate and screw fixator spanning the nondisplaced fracture as noted above. Electronically Signed   By: Van Clines M.D.   On: 04/16/2021 21:01   DG C-Arm 1-60 Min  Result Date: 04/16/2021 CLINICAL DATA:  Surgery, elective. Additional history provided: Open reduction internal fixation (ORIF) distal femur fracture. Provided fluoroscopy time 45 seconds (3.22 mGy). EXAM: RIGHT FEMUR 2 VIEWS; DG C-ARM 1-60 MIN COMPARISON:  Radiographs of the right knee 04/15/2021. FINDINGS: Five intraoperative fluoroscopic images of the right femur are submitted. On the provided images, there are findings of interval ORIF of a known right femoral diaphyseal fracture (utilizing a lateral plate and multiple interlocking screws). Alignment appears anatomic. Redemonstrated sequela of prior right knee arthroplasty. IMPRESSION: Five intraoperative fluoroscopic images of the right femur from right femoral ORIF, as described. Electronically Signed   By: Kellie Simmering DO   On: 04/16/2021 15:31   DG Hip  Unilat W or Wo Pelvis 2-3 Views Right  Result Date: 04/15/2021 CLINICAL DATA:  Right hip pain following fall, initial encounter EXAM: DG HIP (WITH OR WITHOUT PELVIS) 3V RIGHT COMPARISON:  None. FINDINGS: Pelvic ring is intact. Postsurgical changes in the lower lumbar spine are seen. No acute fracture or dislocation is noted. No soft  tissue abnormality is seen. IMPRESSION: No acute abnormality noted. Electronically Signed   By: Inez Catalina M.D.   On: 04/15/2021 00:01   DG FEMUR, MIN 2 VIEWS RIGHT  Result Date: 04/16/2021 CLINICAL DATA:  Surgery, elective. Additional history provided: Open reduction internal fixation (ORIF) distal femur fracture. Provided fluoroscopy time 45 seconds (3.22 mGy). EXAM: RIGHT FEMUR 2 VIEWS; DG C-ARM 1-60 MIN COMPARISON:  Radiographs of the right knee 04/15/2021. FINDINGS: Five intraoperative fluoroscopic images of the right femur are submitted. On the provided images, there are findings of interval ORIF of a known right femoral diaphyseal fracture (utilizing a lateral plate and multiple interlocking screws). Alignment appears anatomic. Redemonstrated sequela of prior right knee arthroplasty. IMPRESSION: Five intraoperative fluoroscopic images of the right femur from right femoral ORIF, as described. Electronically Signed   By: Kellie Simmering DO   On: 04/16/2021 15:31   ECHOCARDIOGRAM LIMITED  Result Date: 04/15/2021    ECHOCARDIOGRAM LIMITED REPORT   Patient Name:   [name] Date of Exam: 04/15/2021 Medical Rec #:  [id number]       Height:       66.0 in Accession #:    [id number]      Weight:       260.0 lb Date of Birth:  01-10-1951       BSA:          2.236 m Patient Age:    84 years        BP:           165/74 mmHg Patient Gender: F               HR:           94 bpm. Exam Location:  Inpatient Procedure: 2D Echo, Cardiac Doppler, Color Doppler and Limited Echo Indications:    R07.9* Chest pain, unspecified  History:        Patient has no prior history of Echocardiogram  examinations.                 Patient was brought to the hospital because she "didn't know                 what was going on".  Sonographer:    Merrie Roof RDCS Referring Phys: [id number] [place]  1. Left ventricular ejection fraction, by estimation, is 65 to 70%. The left ventricle has hyperdynamic function. The left ventricle has no regional wall motion abnormalities. There is moderate concentric left ventricular hypertrophy. Left ventricular diastolic parameters are consistent with Grade I diastolic dysfunction (impaired relaxation).  2. Right ventricular systolic function is normal. The right ventricular size is normal. Tricuspid regurgitation signal is inadequate for assessing PA pressure.  3. The aortic valve is tricuspid. There is mild calcification of the aortic valve. There is mild thickening of the aortic valve. Mild aortic valve sclerosis is present, with no evidence of aortic valve stenosis.  4. The inferior vena cava is normal in size with greater than 50% respiratory variability, suggesting right atrial pressure of 3 mmHg. FINDINGS  Left Ventricle: Left ventricular ejection fraction, by estimation, is 65 to 70%. The left ventricle has hyperdynamic function. The left ventricle has no regional wall motion abnormalities. The left ventricular internal cavity size was normal in size. There is moderate concentric left ventricular hypertrophy. Left ventricular diastolic parameters are consistent with Grade I diastolic dysfunction (impaired relaxation). Normal left ventricular filling pressure. Right Ventricle: The right ventricular size is normal. No  increase in right ventricular wall thickness. Right ventricular systolic function is normal. Tricuspid regurgitation signal is inadequate for assessing PA pressure. Left Atrium: Left atrial size was normal in size. Right Atrium: Right atrial size was normal in size. Pericardium: There is no evidence of pericardial effusion. Mitral Valve: There is  mild thickening of the mitral valve leaflet(s). Mild mitral annular calcification. Tricuspid Valve: The tricuspid valve is normal in structure. Tricuspid valve regurgitation is not demonstrated. Aortic Valve: The aortic valve is tricuspid. There is mild calcification of the aortic valve. There is mild thickening of the aortic valve. Mild aortic valve sclerosis is present, with no evidence of aortic valve stenosis. Aortic valve mean gradient measures 4.0 mmHg. Aortic valve peak gradient measures 6.6 mmHg. Aortic valve area, by VTI measures 2.41 cm. Pulmonic Valve: The pulmonic valve was grossly normal. Pulmonic valve regurgitation is not visualized. Aorta: The aortic root and ascending aorta are structurally normal, with no evidence of dilitation. Venous: The inferior vena cava is normal in size with greater than 50% respiratory variability, suggesting right atrial pressure of 3 mmHg. IAS/Shunts: No atrial level shunt detected by color flow Doppler. LEFT VENTRICLE PLAX 2D LVIDd:         3.40 cm  Diastology LVIDs:         1.90 cm  LV e' medial:    6.74 cm/s LV PW:         1.50 cm  LV E/e' medial:  10.7 LV IVS:        1.40 cm  LV e' lateral:   10.30 cm/s LVOT diam:     1.90 cm  LV E/e' lateral: 7.0 LV SV:         52 LV SV Index:   23 LVOT Area:     2.84 cm  LEFT ATRIUM           Index LA diam:      3.50 cm 1.57 cm/m LA Vol (A4C): 63.8 ml 28.54 ml/m  AORTIC VALVE AV Area (Vmax):    2.50 cm AV Area (Vmean):   2.44 cm AV Area (VTI):     2.41 cm AV Vmax:           128.00 cm/s AV Vmean:          86.600 cm/s AV VTI:            0.214 m AV Peak Grad:      6.6 mmHg AV Mean Grad:      4.0 mmHg LVOT Vmax:         113.00 cm/s LVOT Vmean:        74.400 cm/s LVOT VTI:          0.182 m LVOT/AV VTI ratio: 0.85  AORTA Ao Root diam: 3.00 cm Ao Asc diam:  3.40 cm MITRAL VALVE MV Area (PHT): 5.23 cm     SHUNTS MV Decel Time: 145 msec     Systemic VTI:  0.18 m MV E velocity: 71.80 cm/s   Systemic Diam: 1.90 cm MV A velocity: 118.00  cm/s MV E/A ratio:  0.61 Mihai Croitoru MD Electronically signed by Sanda Klein MD Signature Date/Time: 04/15/2021/4:24:00 PM    Final       Subjective:  Patient seen and examined at the bedside this morning.  Hemodynamically stable for discharge.  Extensive discussion done at the bedside with husband about discharge planning and further steps.  Discharge Exam: Vitals:   04/23/21 2104 04/24/21 0432  BP: 127/86 (!) 157/75  Pulse: 99 93  Resp: 20 20  Temp: 98.2 F (36.8 C) 98.2 F (36.8 C)  SpO2: 100% 100%   Vitals:   04/23/21 1150 04/23/21 2104 04/24/21 0432 04/24/21 0500  BP: (!) 149/81 127/86 (!) 157/75   Pulse: 97 99 93   Resp: '14 20 20   '$ Temp: 98.1 F (36.7 C) 98.2 F (36.8 C) 98.2 F (36.8 C)   TempSrc:      SpO2: 100% 100% 100%   Weight:    120.5 kg  Height:        General: Pt is alert, awake, not in acute distress Cardiovascular: RRR, S1/S2 +, no rubs, no gallops Respiratory: CTA bilaterally, no wheezing, no rhonchi Abdominal: Soft, NT, ND, bowel sounds + Extremities: no edema, no cyanosis    The results of significant diagnostics from this hospitalization (including imaging, microbiology, ancillary and laboratory) are listed below for reference.     Microbiology: Recent Results (from the past 240 hour(s))  Urine culture     Status: Abnormal   Collection Time: 04/15/21  6:10 AM   Specimen: Urine, Random  Result Value Ref Range Status   Specimen Description URINE, RANDOM  Final   Special Requests NONE  Final   Culture (A)  Final    <10,000 COLONIES/mL INSIGNIFICANT GROWTH Performed at [facility], [address], [place], [place] [address]    Report Status 04/16/2021 FINAL  Final  MRSA Next Gen by PCR, Nasal     Status: None   Collection Time: 04/16/21 11:09 AM   Specimen: Nasal Mucosa; Nasal Swab  Result Value Ref Range Status   MRSA by PCR Next Gen NOT DETECTED NOT DETECTED Final    Comment: (NOTE) The GeneXpert MRSA Assay (FDA approved  for NASAL specimens only), is one component of a comprehensive MRSA colonization surveillance program. It is not intended to diagnose MRSA infection nor to guide or monitor treatment for MRSA infections. Test performance is not FDA approved in patients less than 9 years old. Performed at [facility], [place] [address] [place], [place] [address]      Labs: BNP (last 3 results) No results for input(s): BNP in the last 8760 hours. Basic Metabolic Panel: Recent Labs  Lab 04/18/21 0141 04/20/21 0458 04/23/21 0806  NA 136 138 134*  K 3.6 3.7 4.2  CL 105 106 103  CO2 19* 20* 23  GLUCOSE 124* 140* 258*  BUN '11 20 13  '$ CREATININE 0.95 1.08* 1.04*  CALCIUM 8.9 9.9 9.7   Liver Function Tests: No results for input(s): AST, ALT, ALKPHOS, BILITOT, PROT, ALBUMIN in the last 168 hours. No results for input(s): LIPASE, AMYLASE in the last 168 hours. No results for input(s): AMMONIA in the last 168 hours. CBC: Recent Labs  Lab 04/18/21 0141 04/20/21 0458 04/23/21 0806  WBC 10.7* 10.8* 12.2*  NEUTROABS  --  7.0 7.9*  HGB 8.3* 8.9* 9.2*  HCT 26.0* 28.7* 29.4*  MCV 100.8* 103.2* 103.5*  PLT 322 353 399   Cardiac Enzymes: No results for input(s): CKTOTAL, CKMB, CKMBINDEX, TROPONINI in the last 168 hours. BNP: Invalid input(s): POCBNP CBG: Recent Labs  Lab 04/23/21 0832 04/23/21 1209 04/23/21 1732 04/23/21 2058 04/24/21 0827  GLUCAP 269* 186* 222* 192* 157*   D-Dimer No results for input(s): DDIMER in the last 72 hours. Hgb A1c No results for input(s): HGBA1C in the last 72 hours. Lipid Profile No results for input(s): CHOL, HDL, LDLCALC, TRIG, CHOLHDL, LDLDIRECT in the last 72  hours. Thyroid function studies No results for input(s): TSH, T4TOTAL, T3FREE, THYROIDAB in the last 72 hours.  Invalid input(s): FREET3 Anemia work up No results for input(s): VITAMINB12, FOLATE, FERRITIN, TIBC, IRON, RETICCTPCT in the last 72 hours. Urinalysis    Component Value  Date/Time   COLORURINE YELLOW 04/15/2021 0610   APPEARANCEUR CLEAR 04/15/2021 0610   LABSPEC 1.010 04/15/2021 0610   PHURINE 6.0 04/15/2021 0610   GLUCOSEU NEGATIVE 04/15/2021 0610   GLUCOSEU NEGATIVE 01/09/2021 1514   HGBUR NEGATIVE 04/15/2021 0610   BILIRUBINUR NEGATIVE 04/15/2021 0610   BILIRUBINUR negative 04/11/2018 1308   KETONESUR NEGATIVE 04/15/2021 0610   PROTEINUR NEGATIVE 04/15/2021 0610   UROBILINOGEN 1.0 01/09/2021 1514   NITRITE NEGATIVE 04/15/2021 0610   LEUKOCYTESUR NEGATIVE 04/15/2021 0610   Sepsis Labs Invalid input(s): PROCALCITONIN,  WBC,  LACTICIDVEN Microbiology Recent Results (from the past 240 hour(s))  Urine culture     Status: Abnormal   Collection Time: 04/15/21  6:10 AM   Specimen: Urine, Random  Result Value Ref Range Status   Specimen Description URINE, RANDOM  Final   Special Requests NONE  Final   Culture (A)  Final    <10,000 COLONIES/mL INSIGNIFICANT GROWTH Performed at [facility], [place] [address], [place], [place] [address]    Report Status 04/16/2021 FINAL  Final  MRSA Next Gen by PCR, Nasal     Status: None   Collection Time: 04/16/21 11:09 AM   Specimen: Nasal Mucosa; Nasal Swab  Result Value Ref Range Status   MRSA by PCR Next Gen NOT DETECTED NOT DETECTED Final    Comment: (NOTE) The GeneXpert MRSA Assay (FDA approved for NASAL specimens only), is one component of a comprehensive MRSA colonization surveillance program. It is not intended to diagnose MRSA infection nor to guide or monitor treatment for MRSA infections. Test performance is not FDA approved in patients less than 41 years old. Performed at [facility], [place] [address], [place], [place] [address]     Please note: You were cared for by a hospitalist during your hospital stay. Once you are discharged, your primary care physician will handle any further medical issues. Please note that NO REFILLS for any discharge medications will be authorized once you are  discharged, as it is imperative that you return to your primary care physician (or establish a relationship with a primary care physician if you do not have one) for your post hospital discharge needs so that they can reassess your need for medications and monitor your lab values.    Time coordinating discharge: 40 minutes  SIGNED:   Shelly Coss, MD  [facility] 04/24/2021, 10:56 AM Pager [phone]  If 7PM-7AM, please contact night-coverage [email or web address] Password [email or web address]

## 2021-04-24 NOTE — Progress Notes (Signed)
D/C instructions reviewed with daughter and son, pt given grape juice for cbg of 70, noted eating dinner. Waiting for EMS to d/c. No distress or pain noted.

## 2021-04-24 NOTE — TOC Progression Note (Addendum)
Transition of Care [facility]) - Progression Note    Patient Details  Name: [name] MRN: [id number] Date of Birth: 1951/06/17  [facility] CM/SW Contact  Joanne Chars, LCSW Phone Number: 04/24/2021, 1:08 PM  Clinical Narrative:    CSW met with pt husband in room.  Hoyer lift has not been delivered.  CSW unable to get clear picture of where things are at with delivery.  Husband reports he was quoted a price for transport chair of $175, not $78 which CSW was quoted by [employer].  Husband also has an insurance form related to the motor for the lift chair which has been signed by MD.  CSW spoke with Freda Munro at [employer].  They are attempting to deliver hoyer.  She confirmed that the price of the transport chair she quoted me was changed and is $175.  1300: CSW spoke with [employer]-price of transport chair is $240, and [employer]-they cannot get a transport chair. Transport chair on [employer] starts around $140.   CSW spoke with Freda Munro at [employer].  They delivered electric hoyer lift and it would not fit through the door into the bedroom.  They are now switching the lifts out, will bring the [facility] lift, which is smaller and should fit, however, the husband left the house and came back to the hospital so they can't deliver until he returns.  1300: CSW spoke with husband, He said he is feeding pt lunch, but will be available after to receive the new hoyer lift.  Discussed transport chair and husband will obtain his own chair.  1455: CSW received call from [employer].  Driver on the way to the home, trying to reach the husband but no answer.  CSW went to pt room, husband states he has not received any calls.  Informed husband that driver is en route.  Husband refuses to go meet them until he has received a call from the driver. CSW texted back and forth with Freda Munro from [place], when she informed CSW that driver is again attempting to call, returned to room and pt did receive the call and agreed to meet the  driver.     1555: CSW spoke with Freda Munro at [place] who confirmed delivery of hoyer lift at [phone].         Expected Discharge Plan: Skilled Nursing Facility Barriers to Discharge: Continued Medical Work up, SNF Pending bed offer  Expected Discharge Plan and Services Expected Discharge Plan: [place] In-house Referral: Clinical Social Work   Post Acute Care Choice: [place] Living arrangements for the past 2 months: Single Family Home Expected Discharge Date: 04/24/21               DME Arranged: 3-N-1, Walker rolling, Other see comment (fully electric hoyer lift, lift chair) DME Agency: [employer] Date DME Agency Contacted: 04/23/21 Time DME Agency Contacted: [phone] Representative spoke with at DME Agency: Freda Munro HH Arranged: PT, OT, Nurse's Aide [place] Agency: [place] Date [place]: 04/23/21 Time [place]: [place] Representative spoke with at [place]: [place] ([employer]) Interventions    Readmission Risk Interventions No flowsheet data found.

## 2021-04-24 NOTE — TOC Transition Note (Signed)
Transition of Care [facility]) - CM/SW Discharge Note   Patient Details  Name: [name] MRN: [id number] Date of Birth: 03/18/1951  Transition of Care [facility]) CM/SW Contact:  Joanne Chars, LCSW Phone Number: 04/24/2021, 2:41 PM   Clinical Narrative:   Pt discharging home with [facility].  [employer] is in process of providing equipment. See today's note.  No other needs identified.      Final next level of care: [place] Barriers to Discharge: Barriers Resolved   Patient Goals and CMS Choice Patient states their goals for this hospitalization and ongoing recovery are:: walking CMS Medicare.gov Compare Post Acute Care list provided to:: Patient Represenative (must comment) Choice offered to / list presented to : Spouse  Discharge Placement                       Discharge Plan and Services In-house Referral: Clinical Social Work   Post Acute Care Choice: [place]          DME Arranged: 3-N-1, Walker rolling, Other see comment (fully electric hoyer lift, lift chair) DME Agency: [employer] Date DME Agency Contacted: 04/23/21 Time DME Agency Contacted: [phone] Representative spoke with at DME Agency: Freda Munro HH Arranged: PT, OT, Nurse's Aide [place] Agency: [place] Date [place]: 04/23/21 Time [place]: [place] Representative spoke with at [place]: [place] ([place]) Interventions     Readmission Risk Interventions Readmission Risk Prevention Plan 04/24/2021  Transportation Screening Complete  PCP or Specialist appointment within 3-5 days of discharge Complete  HRI or [place] Complete  SW Recovery Care/Counseling Consult Complete  [place] Not Applicable  Some recent data might be hidden

## 2021-04-24 NOTE — Progress Notes (Signed)
04/24/21 1100  OT Visit Information  Last OT Received On 04/24/21  Assistance Needed +2  History of Present Illness [name] is a 70 y.o. female presenting to ED 7/9 with substernal chest pressure, recurrent falls, decreased p.o. intake, nausea and SOB. Imaging (+) R supracondylar distal femur fracture s/p ORIF 7/11 with WBAT. PMHx significant for DMII, CKD stage III-IV, CAD, HTN, HLD, hypothyroidism, depression/anxiety and chronic back pain s/p spinal cord stimulator.  Precautions  Precautions Fall  Pain Assessment  Pain Assessment No/denies pain  Cognition  Arousal/Alertness Awake/alert  Behavior During Therapy Flat affect  Overall Cognitive Status History of cognitive impairments - at baseline  General Comments Oriented to person and place (able to recall name of hosptial), requires increased time to process verbal information; folllows 1-step verbal commands intermittently.  Upper Extremity Assessment  RUE Deficits / Details AROM WFL but requries increased time; requries hand over hand assist to grasp grab bar to come to long sitting in supine.  ADL  General ADL Comments Grossly dependent  Bed Mobility  General bed mobility comments Total A +2 for bed mobility in prep for tilting.  Balance  Standing balance comment Stood with total support in tilt bed. Some quad activation intermittently. With increased degree of tilt, patient with bilateral knee collapse inward with inability to correct.  Restrictions  Weight Bearing Restrictions Yes  RLE Weight Bearing WBAT  Transfers  Overall transfer level Needs assistance  Equipment used  (Tilt bed)  Transfers Sit to/from Stand  Sit to Stand Total assist;+2 safety/equipment  General transfer comment Patient following limited commands to bear weight with bed in 30-45 degrees tilt this date.  General Comments  General comments (skin integrity, edema, etc.) Husband and daughter present at bedside. Attempted BUE AROM and strengthening  with bed tilted 30-45 degrees but patient with decreased and intermittent command following.  OT - End of Session  Activity Tolerance Patient tolerated treatment well  Patient left in bed;with call bell/phone within reach;with family/visitor present  Nurse Communication Mobility status  OT Assessment/Plan  OT Plan Frequency remains appropriate;Discharge plan needs to be updated  OT Visit Diagnosis Unsteadiness on feet (R26.81);Repeated falls (R29.6);Muscle weakness (generalized) (M62.81);Pain  OT Frequency (ACUTE ONLY) Min 2X/week  Follow Up Recommendations Home health OT;Supervision/Assistance - 24 hour;SNF;Other (comment) (Family declined SNF placement, patient to return home with [facility] services.)  [facility] bed  AM-PAC OT "6 Clicks" Daily Activity Outcome Measure (Version 2)  Help from another person eating meals? 1  Help from another person taking care of personal grooming? 1  Help from another person toileting, which includes using toliet, bedpan, or urinal? 1  Help from another person bathing (including washing, rinsing, drying)? 1  Help from another person to put on and taking off regular upper body clothing? 1  Help from another person to put on and taking off regular lower body clothing? 1  6 Click Score 6  Progressive Mobility  What is the highest level of mobility based on the progressive mobility assessment? Level 2 (Ch[place]) - Balance while sitting on edge of bed and cannot stand  Mobility Sit up in bed/chair position for meals  OT Goal Progression  Progress towards OT goals Not progressing toward goals - comment (Patient limited by cognitive deficits, generalized debility and decreased activity tolerance.)  Acute Rehab OT Goals  Patient Stated Goal To return home.  OT Goal Formulation With family  Time For Goal Achievement 05/01/21  Potential to [place]  ADL Goals  Pt Will Perform Eating with min assist;with adaptive utensils;sitting  Pt Will  Perform Grooming with set-up;sitting  Pt Will Perform Upper Body Dressing with set-up;sitting  Pt Will Transfer to Toilet with mod assist;bedside commode  Pt Will Perform Toileting - Clothing Manipulation and hygiene with mod assist;sit to/from stand  Pt/caregiver will Perform Home Exercise Program Increased ROM;Increased strength;Both right and left upper extremity  OT Time Calculation  OT Start Time (ACUTE ONLY) 0924  OT Stop Time (ACUTE ONLY) 0954  OT Time Calculation (min) 30 min  OT General Charges  $OT Visit 1 Visit  OT Treatments  $Therapeutic Activity 23-37 mins

## 2021-04-25 ENCOUNTER — Other Ambulatory Visit: Payer: Self-pay

## 2021-04-25 DIAGNOSIS — E1159 Type 2 diabetes mellitus with other circulatory complications: Secondary | ICD-10-CM

## 2021-04-25 DIAGNOSIS — G9341 Metabolic encephalopathy: Secondary | ICD-10-CM

## 2021-04-25 DIAGNOSIS — N1831 Chronic kidney disease, stage 3a: Secondary | ICD-10-CM

## 2021-04-25 DIAGNOSIS — I152 Hypertension secondary to endocrine disorders: Secondary | ICD-10-CM

## 2021-04-25 NOTE — Telephone Encounter (Signed)
Please call daughter Donella Stade

## 2021-04-25 NOTE — Progress Notes (Signed)
Pt discharged home via PTAR at 0035, daughter at the bedside, belongings sent home with pt. VS stable, pt medically stable at the time of discharge. No further questions at the time of d/c.

## 2021-04-27 ENCOUNTER — Telehealth: Payer: Self-pay | Admitting: Internal Medicine

## 2021-04-27 ENCOUNTER — Telehealth: Payer: Self-pay | Admitting: *Deleted

## 2021-04-27 DIAGNOSIS — R32 Unspecified urinary incontinence: Secondary | ICD-10-CM | POA: Diagnosis not present

## 2021-04-27 DIAGNOSIS — F419 Anxiety disorder, unspecified: Secondary | ICD-10-CM | POA: Diagnosis not present

## 2021-04-27 DIAGNOSIS — E039 Hypothyroidism, unspecified: Secondary | ICD-10-CM | POA: Diagnosis not present

## 2021-04-27 DIAGNOSIS — D72829 Elevated white blood cell count, unspecified: Secondary | ICD-10-CM | POA: Diagnosis not present

## 2021-04-27 DIAGNOSIS — K449 Diaphragmatic hernia without obstruction or gangrene: Secondary | ICD-10-CM | POA: Diagnosis not present

## 2021-04-27 DIAGNOSIS — M961 Postlaminectomy syndrome, not elsewhere classified: Secondary | ICD-10-CM | POA: Diagnosis not present

## 2021-04-27 DIAGNOSIS — M1990 Unspecified osteoarthritis, unspecified site: Secondary | ICD-10-CM | POA: Diagnosis not present

## 2021-04-27 DIAGNOSIS — G43909 Migraine, unspecified, not intractable, without status migrainosus: Secondary | ICD-10-CM | POA: Diagnosis not present

## 2021-04-27 DIAGNOSIS — E1122 Type 2 diabetes mellitus with diabetic chronic kidney disease: Secondary | ICD-10-CM | POA: Diagnosis not present

## 2021-04-27 DIAGNOSIS — G4700 Insomnia, unspecified: Secondary | ICD-10-CM | POA: Diagnosis not present

## 2021-04-27 DIAGNOSIS — E1142 Type 2 diabetes mellitus with diabetic polyneuropathy: Secondary | ICD-10-CM | POA: Diagnosis not present

## 2021-04-27 DIAGNOSIS — K219 Gastro-esophageal reflux disease without esophagitis: Secondary | ICD-10-CM | POA: Diagnosis not present

## 2021-04-27 DIAGNOSIS — I152 Hypertension secondary to endocrine disorders: Secondary | ICD-10-CM | POA: Diagnosis not present

## 2021-04-27 DIAGNOSIS — N1832 Chronic kidney disease, stage 3b: Secondary | ICD-10-CM | POA: Diagnosis not present

## 2021-04-27 DIAGNOSIS — E785 Hyperlipidemia, unspecified: Secondary | ICD-10-CM | POA: Diagnosis not present

## 2021-04-27 DIAGNOSIS — D509 Iron deficiency anemia, unspecified: Secondary | ICD-10-CM | POA: Diagnosis not present

## 2021-04-27 DIAGNOSIS — E1169 Type 2 diabetes mellitus with other specified complication: Secondary | ICD-10-CM | POA: Diagnosis not present

## 2021-04-27 DIAGNOSIS — F32A Depression, unspecified: Secondary | ICD-10-CM | POA: Diagnosis not present

## 2021-04-27 DIAGNOSIS — D539 Nutritional anemia, unspecified: Secondary | ICD-10-CM | POA: Diagnosis not present

## 2021-04-27 DIAGNOSIS — E1159 Type 2 diabetes mellitus with other circulatory complications: Secondary | ICD-10-CM | POA: Diagnosis not present

## 2021-04-27 DIAGNOSIS — G4710 Hypersomnia, unspecified: Secondary | ICD-10-CM | POA: Diagnosis not present

## 2021-04-27 DIAGNOSIS — K5730 Diverticulosis of large intestine without perforation or abscess without bleeding: Secondary | ICD-10-CM | POA: Diagnosis not present

## 2021-04-27 DIAGNOSIS — G8929 Other chronic pain: Secondary | ICD-10-CM | POA: Diagnosis not present

## 2021-04-27 DIAGNOSIS — I2510 Atherosclerotic heart disease of native coronary artery without angina pectoris: Secondary | ICD-10-CM | POA: Diagnosis not present

## 2021-04-27 DIAGNOSIS — G9341 Metabolic encephalopathy: Secondary | ICD-10-CM | POA: Diagnosis not present

## 2021-04-27 NOTE — Telephone Encounter (Signed)
   [place] Name: [facility] Agency Name: Moreen Fowler Phone #: [phone] Service Requested: PT/ SW CONSULT  Frequency of Visits: PT 2W3, [phone]

## 2021-04-27 NOTE — Chronic Care Management (AMB) (Signed)
  Chronic Care Management   Note  04/27/2021 Name: GIANNAH ZAVADIL MRN: [id number] DOB: June 27, 1951  [name] is a 70 y.o. year old female who is a primary care patient of Jenny Reichmann, Hunt Oris, MD. I reached out to [name] by phone today in response to a referral sent by Ms. Ria Comment PCP, Dr. Jenny Reichmann      Ms. Solana was given information about Chronic Care Management services today including:  CCM service includes personalized support from designated clinical staff supervised by her physician, including individualized plan of care and coordination with other care providers 24/7 contact phone numbers for assistance for urgent and routine care needs. Service will only be billed when office clinical staff spend 20 minutes or more in a month to coordinate care. Only one practitioner may furnish and bill the service in a calendar month. The patient may stop CCM services at any time (effective at the end of the month) by phone call to the office staff. The patient will be responsible for cost sharing (co-pay) of up to 20% of the service fee (after annual deductible is met).  Spouse Journi Moffa JR  verbally agreed to assistance and services provided by embedded care coordination/care management team today.  Follow up plan: Telephone appointment with care management team member scheduled for:05/04/2021  [facility]  Direct Dial: [phone]

## 2021-04-27 NOTE — Telephone Encounter (Signed)
Ok for verbals 

## 2021-04-30 NOTE — Telephone Encounter (Signed)
pts daughter in regards to pts FMLA forms for pt [phone].

## 2021-04-30 NOTE — Telephone Encounter (Signed)
Verbals given  

## 2021-05-01 ENCOUNTER — Telehealth (INDEPENDENT_AMBULATORY_CARE_PROVIDER_SITE_OTHER): Payer: Medicare Other | Admitting: Internal Medicine

## 2021-05-01 ENCOUNTER — Encounter: Payer: Self-pay | Admitting: Internal Medicine

## 2021-05-01 DIAGNOSIS — R269 Unspecified abnormalities of gait and mobility: Secondary | ICD-10-CM | POA: Diagnosis not present

## 2021-05-01 DIAGNOSIS — S7290XA Unspecified fracture of unspecified femur, initial encounter for closed fracture: Secondary | ICD-10-CM | POA: Insufficient documentation

## 2021-05-01 DIAGNOSIS — M79642 Pain in left hand: Secondary | ICD-10-CM | POA: Insufficient documentation

## 2021-05-01 DIAGNOSIS — R531 Weakness: Secondary | ICD-10-CM | POA: Diagnosis not present

## 2021-05-01 DIAGNOSIS — E890 Postprocedural hypothyroidism: Secondary | ICD-10-CM

## 2021-05-01 DIAGNOSIS — E1165 Type 2 diabetes mellitus with hyperglycemia: Secondary | ICD-10-CM | POA: Diagnosis not present

## 2021-05-01 DIAGNOSIS — S7290XD Unspecified fracture of unspecified femur, subsequent encounter for closed fracture with routine healing: Secondary | ICD-10-CM | POA: Diagnosis not present

## 2021-05-01 DIAGNOSIS — M79641 Pain in right hand: Secondary | ICD-10-CM | POA: Insufficient documentation

## 2021-05-01 MED ORDER — BACLOFEN 10 MG PO TABS: 10.0000 mg | ORAL_TABLET | Freq: Three times a day (TID) | ORAL | 1 refills | Status: DC

## 2021-05-01 MED ORDER — FEXOFENADINE HCL 180 MG PO TABS: 180.0000 mg | ORAL_TABLET | Freq: Every day | ORAL | 1 refills | Status: AC

## 2021-05-01 MED ORDER — TRAMADOL HCL 50 MG PO TABS: 50.0000 mg | ORAL_TABLET | Freq: Four times a day (QID) | ORAL | 2 refills | Status: DC | PRN

## 2021-05-01 NOTE — Assessment & Plan Note (Signed)
Lab Results  Component Value Date   HGBA1C 7.0 (H) 04/15/2021   Stable, pt to continue current medical treatment trulicity, and f/u endo as planned

## 2021-05-01 NOTE — Patient Instructions (Signed)
Please take all new medication as prescribed  You will be contacted regarding the referral for: home health

## 2021-05-01 NOTE — Assessment & Plan Note (Signed)
Lab Results  Component Value Date   TSH 0.384 04/21/2021   Stable, pt to continue levothyroxine

## 2021-05-01 NOTE — Assessment & Plan Note (Signed)
Also for ortho f/u as planned

## 2021-05-01 NOTE — Assessment & Plan Note (Signed)
With spasm like difficulty - for baclofen prn,  to f/u any worsening symptoms or concerns

## 2021-05-01 NOTE — Assessment & Plan Note (Signed)
Also for PT with [facility] services

## 2021-05-01 NOTE — Telephone Encounter (Signed)
[name] took the patient call and had the FMLA forms correct  and refaxed.

## 2021-05-01 NOTE — Assessment & Plan Note (Signed)
For PT with [facility] services

## 2021-05-01 NOTE — Progress Notes (Signed)
Virtual Visit via Video Note  I connected with [name] on 05/01/21 at  4:00 PM EDT by a video enabled telemedicine application and verified that I am speaking with the correct person using two identifiers.  Location of all participants today Patient: at home Provider: at office   I discussed the limitations of evaluation and management by telemedicine and the availability of in person appointments. The patient expressed understanding and agreed to proceed.  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  Orthopedics consulted and she underwent  ORIF on 04/16/21.  PT/OT recommending skilled nursing facility on discharge.  PT/OT recommended discussion was done discharge but family wanted to take her home.  She needs to follow-up with endocrinology, neurology, orthopedics as an outpatient. Was last eval with TFTs 10 days ago with normal TSH.  Now seeing endo at [facility] fax [phone].  Pt and husband state they need HH [facility] RN, PT, OT and aide as for some reason this was not ordered.  Pt denies chest pain, increased sob or doe, wheezing, orthopnea, PND, increased LE swelling, palpitations, dizziness or syncope.   Pt denies polydipsia, polyuria, or new focal neuro s/s.   Pt denies polydipsia, polyuria, or new focal neuro s/s.  Also stil having bilateral hand spasm like cramping, and muscle relaxer not working.   Past Medical History:  Diagnosis Date   Anxiety    Arthritis    Blood dyscrasia    "FREE BLEEDER"   CERVICAL RADICULOPATHY, LEFT    Chronic back pain    CKD (chronic kidney disease)    DR. [facility]   COMMON MIGRAINE    CORONARY ARTERY DISEASE    Cough    CURRENT COLD   Cystitis    DEPRESSION    Diabetes mellitus, type II (HCC)    DIVERTICULOSIS-COLON    Dysrhythmia    palpitations   Gastric ulcer 04/2008   Gastroparesis     GERD (gastroesophageal reflux disease)    Hiatal hernia    Hyperlipidemia    Hypertension    Hypothyroidism    INSOMNIA-SLEEP DISORDER-UNSPEC    Iron deficiency anemia    Wears glasses    Past Surgical History:  Procedure Laterality Date   ABDOMINAL HYSTERECTOMY     ANTERIOR LAT LUMBAR FUSION Left 08/13/2017   Procedure: LEFT SIDED LUMBAR 3-4 LATERAL INTERBODY FUSION WITH INSTRUMENTATION AND ALLOGRAFT;  Surgeon: Phylliss Bob, MD;  Location: [facility];  Service: Orthopedics;  Laterality: Left;  LEFT SIDED LUMBAR 3-4 LATERAL INTERBODY FUSION WITH INSTRUMENTATION AND ALLOGRAFT; REQUEST 3 HOURS   Back Stimulator  07/2018   BACK SURGERY  03/2016   BREAST EXCISIONAL BIOPSY Right    40 years ago   CHOLECYSTECTOMY  06/2009   COLONOSCOPY     ESOPHAGOGASTRODUODENOSCOPY     EYE SURGERY Bilateral    lasik   Gastric Wedge resection lipoma  11/2007   x2 with laparotomy and gastrostomy   JOINT REPLACEMENT     Left knee replacement     ORIF FEMUR FRACTURE Right 04/16/2021   Procedure: OPEN REDUCTION INTERNAL FIXATION (ORIF) DISTAL FEMUR FRACTURE;  Surgeon: Shona Needles, MD;  Location: [facility];  Service: Orthopedics;  Laterality: Right;   Rigth knee replacement with revision  04/2008   Dr. Berenice Primas   ROTATOR CUFF REPAIR Left 01/2009   s/p bladder surgury  09/2009   Dr. Terance Hart   SPINAL CORD STIMULATOR INSERTION N/A 09/11/2018   Procedure: LUMBAR SPINAL CORD STIMULATOR INSERTION;  Surgeon: Clydell Hakim, MD;  Location: [facility];  Service: Neurosurgery;  Laterality: N/A;  LUMBAR SPINAL CORD STIMULATOR INSERTION    reports that she has quit smoking. She has never used smokeless tobacco. She reports that she does not drink alcohol and does not use drugs. family history includes Breast cancer (age of onset: 24) in an other family member; Breast cancer (age of onset: 3) in her sister; Breast cancer (age of onset: 40) in her sister; Coronary artery disease in an other family member; Diabetes in her brother,  brother, and sister; Heart disease in her brother and sister; Hypertension in an other family member. Allergies  Allergen Reactions   Ciprofloxacin Shortness Of Breath and Other (See Comments)    Dizziness    Hydrocodone Shortness Of Breath and Other (See Comments)    Tachycardia   Hydrocortisone    Penicillins Hives    Pt just remembers hives, SHE HAS HAD AUGEMTIN WITHOUT ISSUES PER PATIENT no SOB or lightheadedness with hypotension: No Has patient had a PCN reaction causing severe rash involving mucus membranes or skin necrosis: No Has patient had a PCN reaction that required hospitalization No Has patient had a PCN reaction occurring within the last 10 years: No If all of the above answers are "NO", then may proceed with Cephalosporin use.    Current Outpatient Medications on File Prior to Visit  Medication Sig Dispense Refill   amLODipine (NORVASC) 10 MG tablet Take 1 tablet (10 mg total) by mouth daily. 30 tablet 0   aspirin 81 MG EC tablet Take 1 tablet (81 mg total) by mouth 2 (two) times daily. After one month,continue taking once a day 60 tablet 0   atorvastatin (LIPITOR) 40 MG tablet TAKE ONE TABLET BY MOUTH EVERY NIGHT AT BEDTIME 90 tablet 3   cloNIDine (CATAPRES) 0.2 MG tablet Take 1 tablet (0.2 mg total) by mouth 2 (two) times daily. 60 tablet 0   Continuous Blood Gluc Receiver (FREESTYLE LIBRE 2 READER) DEVI Use as directed twice daily E11.9 1 each 0   Continuous Blood Gluc Sensor (FREESTYLE LIBRE 2 SENSOR) MISC Use as directed once bi-weekly E11.9 6 each 3   Dulaglutide (TRULICITY) 1.5 0000000 SOPN Inject 1.5 mg into the skin every Monday.     ELMIRON 100 MG capsule Take 200 mg by mouth 2 (two) times daily.      FLUoxetine (PROZAC) 20 MG capsule TAKE ONE CAPSULE BY MOUTH DAILY 90 capsule 3   glucose blood (FREESTYLE TEST STRIPS) test strip Use as instructed to check blood sugar 4 times daily. 100 each 3   glucose blood test strip Use as instructed 100 each 12   Insulin  Glargine (BASAGLAR KWIKPEN) 100 UNIT/ML Inject 30 Units into the skin daily.     insulin lispro (HUMALOG) 100 UNIT/ML injection Inject 3-20 Units into the skin See admin instructions. Per sliding scale 3 times daily     isosorbide mononitrate (IMDUR) 60 MG 24 hr tablet Take 1 tablet (60 mg total) by mouth daily. 90 tablet 3   levothyroxine (SYNTHROID) 50 MCG tablet Take 1 tablet (50 mcg total) by mouth daily at 6 (six) AM. 30 tablet 0   linaclotide (LINZESS) 72 MCG capsule Take 1 capsule (72 mcg total) by mouth daily before breakfast. 30 capsule 11   Loteprednol Etabonate (LOTEMAX SM) 0.38 % GEL Place 1 drop into both  eyes 3 (three) times daily.     megestrol (MEGACE) 40 MG tablet Take 1 tablet (40 mg total) by mouth daily. 30 tablet 0   metoprolol succinate (TOPROL-XL) 50 MG 24 hr tablet TAKE 1 TABLET BY MOUTH DAILY WITH OR IMMEDIATELY FOLLOWING A MEAL 90 tablet 3   nitroGLYCERIN (NITROSTAT) 0.4 MG SL tablet Place 1 tablet (0.4 mg total) under the tongue every 5 (five) minutes as needed for chest pain. 20 tablet 0   ondansetron (ZOFRAN ODT) 4 MG disintegrating tablet Take 1 tablet (4 mg total) by mouth every 8 (eight) hours as needed for up to 15 doses for nausea or vomiting. 15 tablet 0   pantoprazole (PROTONIX) 40 MG tablet TAKE 1 TABLET(40 MG) BY MOUTH TWICE DAILY 180 tablet 3   polyethylene glycol (MIRALAX / GLYCOLAX) 17 g packet Take 17 g by mouth daily as needed. 14 each 0   Vitamin D, Ergocalciferol, (DRISDOL) 1.25 MG (50000 UNIT) CAPS capsule Take 1 capsule (50,000 Units total) by mouth every 7 (seven) days. 6 capsule 0   [DISCONTINUED] enoxaparin (LOVENOX) 40 MG/0.4ML injection Inject 0.4 mLs (40 mg total) into the skin daily. 12 mL 0   No current facility-administered medications on file prior to visit.    Observations/Objective: Alert, NAD, appropriate mood and affect, resps normal, cn 2-12 intact, moves all 4s, no visible rash or swelling Lab Results  Component Value Date   WBC 12.2  (H) 04/23/2021   HGB 9.2 (L) 04/23/2021   HCT 29.4 (L) 04/23/2021   PLT 399 04/23/2021   GLUCOSE 258 (H) 04/23/2021   CHOL 175 01/26/2020   TRIG 71 01/26/2020   HDL 56 01/26/2020   LDLDIRECT 87.0 01/25/2020   LDLCALC 105 (H) 01/26/2020   ALT 13 04/15/2021   AST 14 (L) 04/15/2021   NA 134 (L) 04/23/2021   K 4.2 04/23/2021   CL 103 04/23/2021   CREATININE 1.04 (H) 04/23/2021   BUN 13 04/23/2021   CO2 23 04/23/2021   TSH 0.384 04/21/2021   INR 1.3 (H) 11/09/2020   HGBA1C 7.0 (H) 04/15/2021   MICROALBUR 1.5 07/06/2020   Assessment and Plan: See notes  Follow Up Instructions: See notes   I discussed the assessment and treatment plan with the patient. The patient was provided an opportunity to ask questions and all were answered. The patient agreed with the plan and demonstrated an understanding of the instructions.   The patient was advised to call back or seek an in-person evaluation if the symptoms worsen or if the condition fails to improve as anticipated.   Cathlean Cower, MD

## 2021-05-02 ENCOUNTER — Other Ambulatory Visit: Payer: Self-pay | Admitting: Internal Medicine

## 2021-05-02 DIAGNOSIS — E1159 Type 2 diabetes mellitus with other circulatory complications: Secondary | ICD-10-CM | POA: Diagnosis not present

## 2021-05-02 DIAGNOSIS — I152 Hypertension secondary to endocrine disorders: Secondary | ICD-10-CM | POA: Diagnosis not present

## 2021-05-02 DIAGNOSIS — D509 Iron deficiency anemia, unspecified: Secondary | ICD-10-CM | POA: Diagnosis not present

## 2021-05-02 DIAGNOSIS — E1122 Type 2 diabetes mellitus with diabetic chronic kidney disease: Secondary | ICD-10-CM | POA: Diagnosis not present

## 2021-05-02 DIAGNOSIS — R32 Unspecified urinary incontinence: Secondary | ICD-10-CM | POA: Diagnosis not present

## 2021-05-02 DIAGNOSIS — N1832 Chronic kidney disease, stage 3b: Secondary | ICD-10-CM | POA: Diagnosis not present

## 2021-05-03 ENCOUNTER — Telehealth: Payer: Self-pay

## 2021-05-03 DIAGNOSIS — N1832 Chronic kidney disease, stage 3b: Secondary | ICD-10-CM | POA: Diagnosis not present

## 2021-05-03 DIAGNOSIS — D509 Iron deficiency anemia, unspecified: Secondary | ICD-10-CM | POA: Diagnosis not present

## 2021-05-03 DIAGNOSIS — E1122 Type 2 diabetes mellitus with diabetic chronic kidney disease: Secondary | ICD-10-CM | POA: Diagnosis not present

## 2021-05-03 DIAGNOSIS — E1159 Type 2 diabetes mellitus with other circulatory complications: Secondary | ICD-10-CM | POA: Diagnosis not present

## 2021-05-03 DIAGNOSIS — I152 Hypertension secondary to endocrine disorders: Secondary | ICD-10-CM | POA: Diagnosis not present

## 2021-05-03 DIAGNOSIS — R32 Unspecified urinary incontinence: Secondary | ICD-10-CM | POA: Diagnosis not present

## 2021-05-03 LAB — VITAMIN B1: Vitamin B1 (Thiamine): 34.4 nmol/L — ABNORMAL LOW (ref 66.5–200.0)

## 2021-05-03 MED ORDER — MEGESTROL ACETATE 40 MG PO TABS: 40.0000 mg | ORAL_TABLET | Freq: Every day | ORAL | 2 refills | Status: DC

## 2021-05-03 NOTE — Telephone Encounter (Signed)
Ok for megace  Tramadol already done july 26

## 2021-05-03 NOTE — Telephone Encounter (Signed)
Pts daughter has stated the pt needs a refill for traMADol (ULTRAM) 50 MG tablet and Megestrol.

## 2021-05-03 NOTE — Addendum Note (Signed)
Addended by: Biagio Borg on: 05/03/2021 05:13 PM   Modules accepted: Orders

## 2021-05-04 ENCOUNTER — Telehealth: Payer: Self-pay | Admitting: Internal Medicine

## 2021-05-04 ENCOUNTER — Ambulatory Visit (INDEPENDENT_AMBULATORY_CARE_PROVIDER_SITE_OTHER): Payer: Medicare Other | Admitting: *Deleted

## 2021-05-04 DIAGNOSIS — E1159 Type 2 diabetes mellitus with other circulatory complications: Secondary | ICD-10-CM

## 2021-05-04 DIAGNOSIS — I152 Hypertension secondary to endocrine disorders: Secondary | ICD-10-CM | POA: Diagnosis not present

## 2021-05-04 DIAGNOSIS — R269 Unspecified abnormalities of gait and mobility: Secondary | ICD-10-CM

## 2021-05-04 DIAGNOSIS — E1165 Type 2 diabetes mellitus with hyperglycemia: Secondary | ICD-10-CM | POA: Diagnosis not present

## 2021-05-04 DIAGNOSIS — D509 Iron deficiency anemia, unspecified: Secondary | ICD-10-CM | POA: Diagnosis not present

## 2021-05-04 DIAGNOSIS — R32 Unspecified urinary incontinence: Secondary | ICD-10-CM | POA: Diagnosis not present

## 2021-05-04 DIAGNOSIS — E1122 Type 2 diabetes mellitus with diabetic chronic kidney disease: Secondary | ICD-10-CM | POA: Diagnosis not present

## 2021-05-04 DIAGNOSIS — N1832 Chronic kidney disease, stage 3b: Secondary | ICD-10-CM | POA: Diagnosis not present

## 2021-05-04 NOTE — Addendum Note (Signed)
Addended by: Knox Royalty on: 05/04/2021 12:33 PM   Modules accepted: Orders

## 2021-05-04 NOTE — Chronic Care Management (AMB) (Signed)
Chronic Care Management   CCM RN Visit Note  05/04/2021 Name: [name] MRN: [id number] DOB: Apr 23, 1951  Subjective: [name] is a 70 y.o. year old female who is a primary care patient of Biagio Borg, MD. The care management team was consulted for assistance with disease management and care coordination needs.    Engaged with patient's caregiver/ husband [name] by telephone for initial visit in response to provider referral for case management and/or care coordination services.  Patient provided verbal consent to speak with husband "at any time"  Consent to Services:  The patient was given information about Chronic Care Management services, agreed to services, and gave verbal consent 04/27/21 prior to initiation of services.  Please see initial visit note for detailed documentation.  Patient agreed to services and verbal consent obtained.   Assessment: Review of patient past medical history, allergies, medications, health status, including review of consultants reports, laboratory and other test data, was performed as part of comprehensive evaluation and provision of chronic care management services.   SDOH (Social Determinants of Health) assessments and interventions performed:  SDOH Interventions    Flowsheet Row Most Recent Value  SDOH Interventions   Food Insecurity Interventions Intervention Not Indicated  Housing Interventions Intervention Not Indicated  Transportation Interventions Other (Comment)  [Community Resource Care Guide Referral placed]      CCM Care Plan  Allergies  Allergen Reactions   Ciprofloxacin Shortness Of Breath and Other (See Comments)    Dizziness    Hydrocodone Shortness Of Breath and Other (See Comments)    Tachycardia   Hydrocortisone    Penicillins Hives    Pt just remembers hives, SHE HAS HAD AUGEMTIN WITHOUT ISSUES PER PATIENT no SOB or lightheadedness with hypotension: No Has patient had a PCN reaction causing severe rash  involving mucus membranes or skin necrosis: No Has patient had a PCN reaction that required hospitalization No Has patient had a PCN reaction occurring within the last 10 years: No If all of the above answers are "NO", then may proceed with Cephalosporin use.    Outpatient Encounter Medications as of 05/04/2021  Medication Sig Note   amLODipine (NORVASC) 10 MG tablet Take 1 tablet (10 mg total) by mouth daily.    aspirin 81 MG EC tablet Take 1 tablet (81 mg total) by mouth 2 (two) times daily. After one month,continue taking once a day    atorvastatin (LIPITOR) 40 MG tablet TAKE ONE TABLET BY MOUTH EVERY NIGHT AT BEDTIME    baclofen (LIORESAL) 10 MG tablet Take 1 tablet (10 mg total) by mouth 3 (three) times daily.    cloNIDine (CATAPRES) 0.2 MG tablet Take 1 tablet (0.2 mg total) by mouth 2 (two) times daily.    Continuous Blood Gluc Receiver (FREESTYLE LIBRE 2 READER) DEVI Use as directed twice daily E11.9    Continuous Blood Gluc Sensor (FREESTYLE LIBRE 2 SENSOR) MISC Use as directed once bi-weekly E11.9    Dulaglutide (TRULICITY) 1.5 JH/4.1DE SOPN Inject 1.5 mg into the skin every Monday.    ELMIRON 100 MG capsule Take 200 mg by mouth 2 (two) times daily.  05/15/2014: .   fexofenadine (ALLEGRA) 180 MG tablet Take 1 tablet (180 mg total) by mouth daily.    FLUoxetine (PROZAC) 20 MG capsule TAKE ONE CAPSULE BY MOUTH DAILY    glucose blood (FREESTYLE TEST STRIPS) test strip Use as instructed to check blood sugar 4 times daily.    glucose blood test strip Use as instructed  Insulin Glargine (BASAGLAR KWIKPEN) 100 UNIT/ML Inject 30 Units into the skin daily.    insulin lispro (HUMALOG) 100 UNIT/ML injection Inject 3-20 Units into the skin See admin instructions. Per sliding scale 3 times daily    isosorbide mononitrate (IMDUR) 60 MG 24 hr tablet Take 1 tablet (60 mg total) by mouth daily.    levothyroxine (SYNTHROID) 50 MCG tablet Take 1 tablet (50 mcg total) by mouth daily at 6 (six) AM.     linaclotide (LINZESS) 72 MCG capsule Take 1 capsule (72 mcg total) by mouth daily before breakfast.    Loteprednol Etabonate (LOTEMAX SM) 0.38 % GEL Place 1 drop into both eyes 3 (three) times daily.    megestrol (MEGACE) 40 MG tablet Take 1 tablet (40 mg total) by mouth daily.    metoprolol succinate (TOPROL-XL) 50 MG 24 hr tablet TAKE 1 TABLET BY MOUTH DAILY WITH OR IMMEDIATELY FOLLOWING A MEAL    nitroGLYCERIN (NITROSTAT) 0.4 MG SL tablet Place 1 tablet (0.4 mg total) under the tongue every 5 (five) minutes as needed for chest pain.    ondansetron (ZOFRAN ODT) 4 MG disintegrating tablet Take 1 tablet (4 mg total) by mouth every 8 (eight) hours as needed for up to 15 doses for nausea or vomiting.    pantoprazole (PROTONIX) 40 MG tablet TAKE 1 TABLET(40 MG) BY MOUTH TWICE DAILY    polyethylene glycol (MIRALAX / GLYCOLAX) 17 g packet Take 17 g by mouth daily as needed.    traMADol (ULTRAM) 50 MG tablet Take 1 tablet (50 mg total) by mouth every 6 (six) hours as needed for moderate pain or severe pain.    [DISCONTINUED] enoxaparin (LOVENOX) 40 MG/0.4ML injection Inject 0.4 mLs (40 mg total) into the skin daily.    No facility-administered encounter medications on file as of 05/04/2021.   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   Acute diverticulitis 03/21/2021   Degenerative disc disease, cervical 02/08/2021   Type 2 diabetes mellitus ([place]) 02/02/2021   Acquired hypothyroidism 11/24/2020   Chronic pain 11/24/2020   Severe sepsis ([place]) 11/09/2020   Hyperlipidemia associated with type 2 diabetes mellitus ([place]) 93/79/0240   Acute metabolic encephalopathy 97/35/3299   Acute lower UTI 11/09/2020   Recurrent falls 10/19/2020   Gait disorder 10/11/2020   Fall 10/11/2020   Low grade fever 02/04/2020   Chest pain 01/26/2020    Lumbar post-laminectomy syndrome 10/05/2019   Spinal cord stimulator status 10/05/2019   Body mass index (BMI) 40.0-44.9, adult ([place]) 10/05/2019   Rectal bleeding 09/01/2019   History of peptic ulcer disease 09/01/2019   Screening for viral disease 09/01/2019   Insulin dependent type 2 diabetes mellitus ([place]) 09/01/2019   Pyelonephritis 06/05/2019   Recurrent UTI 06/05/2019   Urinary incontinence 06/05/2019   Nausea & vomiting 02/08/2019   Abdominal pain 02/08/2019   Constipation 02/08/2019   UTI (urinary tract infection) 02/08/2019   LUQ pain 01/21/2019   Cervical radiculopathy at C6 01/28/2018   Left lateral epicondylitis 01/23/2018   Obesity 01/23/2018   Weakness 05/01/2017   Hyperkalemia 05/01/2017   Acute kidney injury superimposed on CKD ([place]) 05/01/2017   Essential (primary) hypertension 05/01/2017   Sepsis ([place]) 05/01/2017   Dysuria 04/23/2017   Right leg swelling 12/27/2016   Leg pain, lateral, right 11/05/2016   Radiculopathy 04/03/2016   Hypersomnolence 11/23/2015  Right sided abdominal pain 07/25/2015   Malaise and fatigue 11/05/2014   Rotator cuff syndrome 07/26/2014   Fracture of fifth metacarpal bone of right hand 07/08/2014   Right cervical radiculopathy 06/27/2014   Chronic interstitial cystitis 05/23/2014   Osteoarthritis of CMC joint of thumb 04/11/2014   De Quervain's tenosynovitis, left 03/14/2014   CKD (chronic kidney disease), stage III ([place]) 03/03/2014   Lower back pain 03/03/2014   Ingrown nail 03/03/2014   Peripheral edema 02/23/2014   Lateral epicondylitis of right elbow 01/31/2014   Neck pain on left side 11/19/2013   Abdominal discomfort 10/12/2012   Right lumbar radiculopathy 05/11/2012   Peripheral neuropathy 05/11/2012   Vertigo 03/29/2012   Headache(784.0) 03/10/2012   Left lower quadrant abdominal pain 10/24/2011   Lumbar radiculopathy 10/24/2011   Hypothyroidism 08/19/2011   Right leg weakness 04/04/2011   Hematochezia 01/07/2011    Preventative health care 01/07/2011   CHOLELITHIASIS 05/04/2009   GERD 04/26/2009   Gastroparesis 04/26/2009   ULCER-GASTRIC 03/15/2009   DIVERTICULOSIS-COLON 03/15/2009   Cervical radiculopathy 10/28/2008   MENOPAUSAL DISORDER 06/29/2008   Pain in joint, shoulder region 10/12/2007   INSOMNIA-SLEEP DISORDER-UNSPEC 09/01/2007   Depression 09/01/2007   Macrocytic anemia 08/13/2007   COMMON MIGRAINE 08/13/2007   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   Diabetes ([place]) 05/06/2007   HLD (hyperlipidemia) 05/06/2007   ANXIETY 05/06/2007   Hypertension associated with diabetes ([place]) 05/06/2007   Coronary atherosclerosis 05/06/2007   Conditions to be addressed/monitored:  HTN and DMII  Care Plan : General Plan of Care (Adult)  Updates made by Knox Royalty, RN since 05/04/2021 12:00 AM     Problem: Health Promotion or Disease Self-Management (General Plan of Care)   Priority: High     Goal: Self-Management Plan Developed   Start Date: 05/04/2021  Expected End Date: 06/04/2021  This Visit's Progress: On track  Priority: High  Note:   Current Barriers:  Ineffective Self Health Maintenance in a patient with self-health management post-recent femur surgery Transportation, Inability to perform ADL's/ iADL's independently post- recent femur surgery: was independent prior to surgery, per caregiver/ husband now requires total care at home and is bedridden Refused SNF rehabilitation placement at time of recent hospitalization July 9-20, 2022 for femur surgery  Clinical Goal(s):  05/04/21:  Over the next 30 days, caregiver and patient will work with care management team to address care coordination needs related to post-operative recovery at home as evidenced by caregiver/ patient reporting during CCM RN CM outreach of: Active engagement with/ participation in home health team services Engagement with [facility] for resources for transportation to provider appointments and  in-home care assistance Interventions:  Evaluation of current treatment plan related to HTN, DMII, and recent femur surgery, self-management and patient's adherence to plan as established by provider. Collaboration with Biagio Borg, MD regarding development and update of comprehensive plan of care as evidenced by provider attestation       and co-signature Inter-disciplinary care team collaboration (see longitudinal plan of care) Discussed with caregiver patient's recent hospitalization for femur surgery: he reports no clinical concerns, but states that he and his adult daughter are providing total care for patient post- recent surgery- states he will not be able to transport patient to post-op provider appointments due to her lack of mobility; reports he is limited in ability to care for patient and would like resources for in-home care assistance: will place community care guide referral- encouraged his engagement with care guide for resources; encouraged  him to promptly contact resources once they are provided Confirmed home health services are in place, with first PT visit scheduled today: encouraged patient's active engagement with home health team; provided education around role of home health team, confirmed caregiver has contact information for home health team Confirmed no falls post-hospital discharge; fall prevention discussed: patient does not have hospital bed, but does have wheelchair/ cane/ walker Discussed plans with patient for ongoing care management follow up and provided patient with direct contact information for care management team Self Care Activities:  Caregiver verbalizes understanding of plan to actively participate in home health services and to engage with [facility] to determine resources for transportation and for in-home care assistance Calls pharmacy for medication refills Calls provider office for new concerns or questions Patient Goals: I have  asked the [facility] to contact you to provide community resources for transportation to provider appointments and for in-home care assistance: please listen out for a phone call from the care guide and contact these resources promptly once they are provided Work with the home health team to determine what Amarie's needs are for home equipment: the home health team will be able to communicate with [facility] doctors to request equipment or needs Begin a list or a notebook of services in my neighborhood or community Follow-up on any referrals for help I am given Think ahead to make sure my need does not become an emergency Develop a back-up plan  Follow Up Plan:  Telephone follow up appointment with care management team member scheduled for:  Thursday May 24, 2021 The patient has been provided with contact information for the care management team and has been advised to call with any health related questions or concerns    Care Plan : Diabetes Type 2 (Adult)  Updates made by Knox Royalty, RN since 05/04/2021 12:00 AM     Problem: Blood sugar and blood pressure Management (Diabetes, Type 2)   Priority: Medium     Long-Range Goal: Glycemic Management Optimized   Start Date: 05/04/2021  Expected End Date: 05/04/2022  This Visit's Progress: On track  Priority: Medium  Note:   Objective:  Lab Results  Component Value Date   HGBA1C 7.0 (H) 04/15/2021   Lab Results  Component Value Date   CREATININE 1.04 (H) 04/23/2021   CREATININE 1.08 (H) 04/20/2021   CREATININE 0.95 04/18/2021  No results found for: EGFR Current Barriers:  Knowledge Deficits related to basic Diabetes pathophysiology and self care/management- could benefit from ongoing support/ reinforcement Recent hospitalization July 9- 20, 2022 for femur fracture: patient refused SNF Rehabilitation and caregiver reports is now bedridden/ total care Unable to perform ADLs/ iADL's independently post- recent  hospitalization Case Manager Clinical Goal(s):  05/04/21: Over the next 12 months, patient will demonstrate ongoing adherence to prescribed treatment plan for diabetes and HTN self care/management as evidenced by patient reporting during CCM RN CM outreach of:  Daily monitoring and recording of CBG 3-4 times daily  Weekly monitoring/ recording of blood pressures at home Adherence to ADA/ carb modified, low sugar diet  Adherence to prescribed medication regimen  Contacting care providers for new or worsened symptoms, concerns or questions Interventions:  Collaboration with Biagio Borg, MD regarding development and update of comprehensive plan of care as evidenced by provider attestation and co-signature Inter-disciplinary care team collaboration (see longitudinal plan of care) Review of patient status, including review of consultants reports, relevant laboratory and other test results, and medications completed  Chart reviewed including relevant office notes, upcoming scheduled appointments, recent hospital visit, and lab results Discussed current  clinical condition with husband/ caregiver and confirmed no current clinical concerns; reports patient doing well overall, but states that he is having to provide all (total) care now that she is at home from hospital: states she is immobile and bedridden since hospital discharge; he and his adult daughter are providing total care for patient and would like in-home care assistance- community resource care guide referral placed; discussed with caregiver that care guide will contact him to provide resources, but he will need to follow up on resources once they are provided- her verbalizes understanding Confirmed caregiver manages medications; reports good adherence to medication regimen and deny post-hospital medication concerns; caregiver prepares medications, provides to patient when scheduled; declines medication review today, "too much going on;" agreeable  to complete at time of next scheduled CCM RN CM phone appointment  Reviewed recent hospitalization July 9-20, 2022 with caregiver: caregiver has good understanding of post-hospital discharge instructions; reports unable to get patient out of bed to take to provider appointments: [employer] care guide referral- as above Reviewed recent PCP virtual office visit 05/01/21 with caregiver: he denies questions Reports good control of blood sugars at home post-hospital discharge: consistently use CGM/ FSL; reviewed blood sugars at home with caregiver- reports consistent ranges between 80-120, both fasting and post-prandial; confirms patient adherent to insulin dosing/ scheduling: encouraged to continue monitoring at home per baseline Confirmed patient has good appetite; not following specific diet, "tries" to eat low carb, low sugar, low salt diet Not routinely monitoring/ recording blood pressures at home: encouraged to begin doing so 2-3 times per week Discussed importance of skin care/ post-op surgical wound care in setting of DM: discussed complications, signs/ symptoms skin breakdown; caregiver reports "her skin looks fine" today-- he was encouraged to monitor/ assess frequently throughout day; importance of meticulous skin/ wound care discussed, especially given that patient is currently "bedridden" Confirmed patient endorses "trying to" follow low carbohydrate and sugar, salt, heart healthy diet-- but adds, "she eats whatever she wants;" importance of well-balanced diet with sources of protein for wound/ skin integrity discussed with caregiver; he reports her appetite is "good" Reviewed upcoming provider appointments with caregiver and confirmed that he will need transportation resources short-term as patient is total care post- recent hospitalization: podiatry office visit 05/18/21; PCP office visit 06/29/21 Discussed plans with patient for ongoing care management follow up and provided patient with  direct contact information for care management team Self-Care Activities Checks blood sugars as instructed using CGM and is adherent to medication regimine Adheres to prescribed ADA/carb modified Patient Goals: Continue to frequently check blood sugars at home using [facility] Check blood sugars more often if you feel it is too high or too low or if Aunika begins to feel worse Take medications as prescribed and contact Jamisha's doctor's if there are medication concerns or problems Keep a close eye on Leean's skin condition now that she is not as mobile as she used to be; this includes her bottom and her surgical wound: if her skin becomes broken or red, or you have any concerns about her surgical wound, please call Marylen's doctor's right away Continue to follow a low sugar/ carbohydrate and low salt diet Please monitor blood pressures at home 2-3 times per week and write down the blood pressures at home: we will review these during each of our follow up phone call appointments Follow Up Plan:  Telephone follow  up appointment with patient's caregiver and care management team member scheduled for:  Thursday, May 24, 2021 The patient's caregiver has been provided with contact information for the care management team and has been advised to call with any health related questions or concerns.      Plan: Telephone follow up appointment with care management team member scheduled for:  Thursday, May 24, 2021 The patient has been provided with contact information for the care management team and has been advised to call with any health related questions or concerns  Oneta Rack, RN, BSN, [place] [phone]: direct office [phone]: mobile

## 2021-05-04 NOTE — Patient Instructions (Signed)
Visit Information   [name], it was nice talking with you today about Si[place]ia's health care needs.   I look forward to talking to you again for an update on Thursday, May 24, 2021 at 10:00 am- please be listening out for my call that day.  I will call as close to 10:00 am as possible.  If you need to cancel or re-schedule our telephone visit, please call [phone] and one of our care guides will be happy to assist you.  I look forward to hearing about your progress.   Please don't hesitate to contact me if I can be of assistance to you before our next scheduled appointment.   Oneta Rack, RN, BSN, [facility] RN Care Coordination- [place] [phone]: direct office [phone]: mobile   PATIENT GOALS:   Goals Addressed             This Visit's Progress    Find Help in My Community   On track    Timeframe:  Short-Term Goal Priority:  High Start Date:       05/04/21                      Expected End Date:      06/04/21                 Follow Up Date 05/24/2021    I have asked the [facility] to contact you to provide community resources for transportation to provider appointments and for in-home care assistance: please listen out for a phone call from the care guide and contact these resources promptly once they are provided Work with the home health team to determine what [place]'s needs are for home equipment: the home health team will be able to communicate with [place] doctors to request equipment or needs Begin a list or a notebook of services in my neighborhood or community Follow-up on any referrals for help I am given Think ahead to make sure my need does not become an emergency Develop a back-up plan    Why is this important?   Knowing how and where to find help for yourself or family in your neighborhood and community is an important skill.  You will want to take some steps to learn how.       Monitor and  Manage My Blood Sugar-Diabetes Type 2   On track    Timeframe:  Long-Range Goal Priority:  Medium Start Date:           05/04/21                  Expected End Date:    05/04/22                   Follow Up Date 05/24/2021    Continue to frequently check blood sugars at home using [facility] Check blood sugars more often if you feel it is too high or too low or if Ryna begins to feel worse Take medications as prescribed and contact Camilia's doctor's if there are medication concerns or problems Keep a close eye on Lelani's skin condition now that she is not as mobile as she used to be; this includes her bottom and her surgical wound: if her skin becomes broken or red, or you have any concerns about her surgical wound, please call [place]'s doctor's right away Continue to follow a low sugar/ carbohydrate and low salt diet  Please monitor blood pressures at home 2-3 times per week and write down the blood pressures at home: we will review these during each of our follow up phone call appointments    Why is this important?   Checking your blood sugar at home helps to keep it from getting very high or very low.  Writing the results in a diary or log helps the doctor know how to care for you.  Your blood sugar log should have the time, date and the results.  Also, write down the amount of insulin or other medicine that you take.  Other information, like what you ate, exercise done and how you were feeling, will also be helpful.            Skin Care and Bowel Hygiene   Anyone who has frequent bowel movements, diarrhea, or bowel leakage (fecal incontinence) may experience soreness or skin irritation around the anal region.  Occasionally, the skin can become so inflamed that it breaks into open sores.  Prevent skin breakdown by following good skin care habits.  Cleaning and Washing Techniques After having a bowel movement, men and women should tighten their anal sphincter before wiping.   Women should always wipe from front to back to prevent fecal matter from getting into the urethra and vagina.   Tips for Cleaning and Washing wipe from front to back towards the anus always wipe gently with soft toilet paper, or ideally with moist toilet paper wipe only once with each piece of toilet paper so as not to re-contaminate the area wash in warm water alone or with a minimal amount of mild, fragrance-free soap use non-biological washing powder gently pat skin completely dry, avoiding rubbing if drying the skin after washing is difficult or uncomfortable, try using a hairdryer on a low setting (use very carefully) allow air to get to the irritated area for some part of every day use protective skin creams containing zinc as recommended by your doctor  What To Avoid baths with extra-hot water soaking for long periods of time in the bathtub disinfectants and antiseptics  bath oils, bath salts, and talcum powder using plastic pants, pads, and sheets, which cause sweating scratching at the irritated area  Additional Tips some people find that citrus and acidic foods cause or worsen skin irritation eat a healthy, balanced diet that is high in fiber  drink plenty of fluids wear cotton underwear to allow the skin to breath talk to your healthcare provider about further treatment options; persistent problems need medical attention  Preventing Problems After Surgery Having surgery can put you at risk for complications such as pneumonia or blood clots. There are things you can do to prevent complications as you recover in the hospital. Follow instructions from your health care team after surgery to ensure a successful recovery. Talk with your health care provider if you havequestions or concerns. Follow these instructions after surgery: General instructions  Wash your hands regularly with soap and water. Ask for help if you cannot reach the sink or if you need help walking to the  bathroom. Anyone who enters your hospital room should wash his or her hands. This includes all hospital staff and visitors. Handwashing is the best action to prevent infection. When you are in bed, keep the head of the bed raised 30 degrees or more, if your health care provider approves. This means that your head should be higher than your body. Raising the head of your bed helps you breathe  more easily and clear mucus out of your lungs. This can help prevent pneumonia.  Activity Take short walks several times a day, or as recommended by your health care provider. Walking helps you recover. It also helps to prevent blood clots. Get out of bed and sit in a chair, if you are able. Sitting upright helps your lungs to expand and fill with air. Pain management Work with your health care team to make a pain management plan. If you are comfortable and not in pain, you are more likely to: Get out of bed and walk. Sleep better. Take deep breaths. Heal faster. Let your health care provider know if your pain is severe or interferes with sleep and activity. Coughing and deep breathing Take deep breaths and cough every two hours while you are awake. This helps to expand your lungs and get rid of mucus. To do this: Sit on the edge of a bed, or sit up as far as you can in a bed or a chair. Take in a slow, deep breath through your nose. Hold the breath for a few seconds. Slowly let the breath out through your mouth, like you are blowing out a candle. Take 3-5 deep breaths as described in steps 2-4. Hold in the last breath for a few seconds. Cough deeply 2-3 times. Push the air out of your lungs as you cough. If you have a cut (an incision) on your chest or abdomen, hold a pillow or your hands against your incision as you cough. Using an incentive spirometer     If you were given an incentive spirometer, use it every 1-2 hours while you are awake or as recommended by your health care provider. This device  measures how well you are filling your lungs with each breath. Regular use of the device can: Help you practice slow, deep breathing. Gradually help to expand your lungs more. Help prevent complications after surgery. To use the device: Sit on the edge of your bed if possible, or sit up as far as you can in bed or a chair. Hold the device in an upright position. Breathe out normally. Place the mouthpiece in your mouth, and seal your lips tightly around it. Breathe in slowly and as deeply as possible, raising the piston or the ball toward the top of the column. Hold your breath for 3-5 seconds or for as long as possible. Allow the piston or ball to fall to the bottom of the column. Remove the mouthpiece from your mouth and breathe out normally. If the device has an indicator to show your best effort, use the indicator as a goal to work toward during each repetition. Rest for a few seconds. Repeat this process 10 or more times. Take your time, and take a few normal breaths between deep breaths. Breathing too quickly may make you feel dizzy or cause you to pass out. Practice coughing to be sure your lungs are clear. If you have an incision on your chest or abdomen, hold a pillow or your hands against your incision as you cough. Mouth care  Keep your mouth clean to prevent infection. Follow these steps two times a day: Brush and floss your teeth. Floss between all teeth. Ask for help if you need it. Rinse your mouth with water after brushing. Rinse your mouth with a mouthwash, and then spit out the mouthwash. If you have dentures: Clean your dentures two times a day. Use a soft-bristle brush to brush your gums, tongue, and  mouth.  Summary Having surgery can put you at risk for complications such as pneumonia or blood clots. There are things you can do to prevent complications and to help ensure a successful recovery. Take short walks several times a day. Walking helps you recover. It also  helps to prevent blood clots. Regularly take deep breaths and cough, and use an incentive spirometer as told by your health care provider. These actions help prevent pneumonia. This information is not intended to replace advice given to you by your health care provider. Make sure you discuss any questions you have with your healthcare provider. Document Revised: 02/04/2020 Document Reviewed: 03/16/2020 Elsevier Patient Education  2022 [employer].  Consent to CCM Services: Ms. Skoczylas was given information about Chronic Care Management services 04/27/21 including:  CCM service includes personalized support from designated clinical staff supervised by her physician, including individualized plan of care and coordination with other care providers 24/7 contact phone numbers for assistance for urgent and routine care needs. Service will only be billed when office clinical staff spend 20 minutes or more in a month to coordinate care. Only one practitioner may furnish and bill the service in a calendar month. The patient may stop CCM services at any time (effective at the end of the month) by phone call to the office staff. The patient will be responsible for cost sharing (co-pay) of up to 20% of the service fee (after annual deductible is met).  Patient agreed to services and verbal consent obtained.   The patient verbalized understanding of instructions, educational materials, and care plan provided today and agreed to receive a mailed copy of patient instructions, educational materials, and care plan.  Telephone follow up appointment with care management team member scheduled for:  Thursday, May 24, 2021 The patient has been provided with contact information for the care management team and has been advised to call with any health related questions or concerns.   Oneta Rack, RN, BSN, [facility] RN Care Coordination- [place] [phone]: direct office [phone]: mobile   CLINICAL CARE PLAN: Patient Care Plan: General Plan of Care (Adult)     Problem Identified: Health Promotion or Disease Self-Management (General Plan of Care)   Priority: High     Goal: Self-Management Plan Developed   Start Date: 05/04/2021  Expected End Date: 06/04/2021  This Visit's Progress: On track  Priority: High  Note:   Current Barriers:  Ineffective Self Health Maintenance in a patient with self-health management post-recent femur surgery Transportation, Inability to perform ADL's/ iADL's independently post- recent femur surgery: was independent prior to surgery, per caregiver/ husband now requires total care at home and is bedridden Refused SNF rehabilitation placement at time of recent hospitalization July 9-20, 2022 for femur surgery  Clinical Goal(s):  05/04/21:  Over the next 30 days, caregiver and patient will work with care management team to address care coordination needs related to post-operative recovery at home as evidenced by caregiver/ patient reporting during CCM RN CM outreach of: Active engagement with/ participation in home health team services Engagement with [facility] for resources for transportation to provider appointments and in-home care assistance Interventions:  Evaluation of current treatment plan related to HTN, DMII, and recent femur surgery, self-management and patient's adherence to plan as established by provider. Collaboration with Biagio Borg, MD regarding development and update of comprehensive plan of care as evidenced by provider attestation       and co-signature  Inter-disciplinary care team collaboration (see longitudinal plan of care) Discussed with caregiver patient's recent hospitalization for femur surgery: he reports no clinical concerns, but states that he and his adult daughter are providing total care for patient post- recent surgery- states he will not be able to transport patient to post-op  provider appointments due to her lack of mobility; reports he is limited in ability to care for patient and would like resources for in-home care assistance: will place community care guide referral- encouraged his engagement with care guide for resources; encouraged him to promptly contact resources once they are provided Confirmed home health services are in place, with first PT visit scheduled today: encouraged patient's active engagement with home health team; provided education around role of home health team, confirmed caregiver has contact information for home health team Confirmed no falls post-hospital discharge; fall prevention discussed: patient does not have hospital bed, but does have wheelchair/ cane/ walker Discussed plans with patient for ongoing care management follow up and provided patient with direct contact information for care management team Self Care Activities:  Caregiver verbalizes understanding of plan to actively participate in home health services and to engage with [place] to determine resources for transportation and for in-home care assistance Calls pharmacy for medication refills Calls provider office for new concerns or questions Patient Goals: I have asked the [facility] to contact you to provide community resources for transportation to provider appointments and for in-home care assistance: please listen out for a phone call from the care guide and contact these resources promptly once they are provided Work with the home health team to determine what [place]'s needs are for home equipment: the home health team will be able to communicate with [place] doctors to request equipment or needs Begin a list or a notebook of services in my neighborhood or community Follow-up on any referrals for help I am given Think ahead to make sure my need does not become an emergency Develop a back-up plan  Follow Up Plan:  Telephone follow up  appointment with care management team member scheduled for:  Thursday May 24, 2021 The patient has been provided with contact information for the care management team and has been advised to call with any health related questions or concerns    Patient Care Plan: Diabetes Type 2 (Adult)     Problem Identified: Blood sugar and blood pressure Management (Diabetes, Type 2)   Priority: Medium     Long-Range Goal: Glycemic Management Optimized   Start Date: 05/04/2021  Expected End Date: 05/04/2022  This Visit's Progress: On track  Priority: Medium  Note:   Objective:  Lab Results  Component Value Date   HGBA1C 7.0 (H) 04/15/2021   Lab Results  Component Value Date   CREATININE 1.04 (H) 04/23/2021   CREATININE 1.08 (H) 04/20/2021   CREATININE 0.95 04/18/2021  No results found for: EGFR Current Barriers:  Knowledge Deficits related to basic Diabetes pathophysiology and self care/management- could benefit from ongoing support/ reinforcement Recent hospitalization July 9- 20, 2022 for femur fracture: patient refused SNF Rehabilitation and caregiver reports is now bedridden/ total care Unable to perform ADLs/ iADL's independently post- recent hospitalization Case Manager Clinical Goal(s):  05/04/21: Over the next 12 months, patient will demonstrate ongoing adherence to prescribed treatment plan for diabetes and HTN self care/management as evidenced by patient reporting during CCM RN CM outreach of:  Daily monitoring and recording of CBG 3-4 times daily  Weekly monitoring/ recording of  blood pressures at home Adherence to ADA/ carb modified, low sugar diet  Adherence to prescribed medication regimen  Contacting care providers for new or worsened symptoms, concerns or questions Interventions:  Collaboration with Biagio Borg, MD regarding development and update of comprehensive plan of care as evidenced by provider attestation and co-signature Inter-disciplinary care team collaboration  (see longitudinal plan of care) Review of patient status, including review of consultants reports, relevant laboratory and other test results, and medications completed Chart reviewed including relevant office notes, upcoming scheduled appointments, recent hospital visit, and lab results Discussed current  clinical condition with husband/ caregiver and confirmed no current clinical concerns; reports patient doing well overall, but states that he is having to provide all (total) care now that she is at home from hospital: states she is immobile and bedridden since hospital discharge; he and his adult daughter are providing total care for patient and would like in-home care assistance- community resource care guide referral placed; discussed with caregiver that care guide will contact him to provide resources, but he will need to follow up on resources once they are provided- her verbalizes understanding Confirmed caregiver manages medications; reports good adherence to medication regimen and deny post-hospital medication concerns; caregiver prepares medications, provides to patient when scheduled; declines medication review today, "too much going on;" agreeable to complete at time of next scheduled CCM RN CM phone appointment  Reviewed recent hospitalization July 9-20, 2022 with caregiver: caregiver has good understanding of post-hospital discharge instructions; reports unable to get patient out of bed to take to provider appointments: [employer] care guide referral- as above Reviewed recent PCP virtual office visit 05/01/21 with caregiver: he denies questions Reports good control of blood sugars at home post-hospital discharge: consistently use CGM/ FSL; reviewed blood sugars at home with caregiver- reports consistent ranges between 80-120, both fasting and post-prandial; confirms patient adherent to insulin dosing/ scheduling: encouraged to continue monitoring at home per baseline Confirmed patient  has good appetite; not following specific diet, "tries" to eat low carb, low sugar, low salt diet Not routinely monitoring/ recording blood pressures at home: encouraged to begin doing so 2-3 times per week Discussed importance of skin care/ post-op surgical wound care in setting of DM: discussed complications, signs/ symptoms skin breakdown; caregiver reports "her skin looks fine" today-- he was encouraged to monitor/ assess frequently throughout day; importance of meticulous skin/ wound care discussed, especially given that patient is currently "bedridden" Confirmed patient endorses "trying to" follow low carbohydrate and sugar, salt, heart healthy diet-- but adds, "she eats whatever she wants;" importance of well-balanced diet with sources of protein for wound/ skin integrity discussed with caregiver; he reports her appetite is "good" Reviewed upcoming provider appointments with caregiver and confirmed that he will need transportation resources short-term as patient is total care post- recent hospitalization: podiatry office visit 05/18/21; PCP office visit 06/29/21 Discussed plans with patient for ongoing care management follow up and provided patient with direct contact information for care management team Self-Care Activities Checks blood sugars as instructed using CGM and is adherent to medication regimine Adheres to prescribed ADA/carb modified Patient Goals: Continue to frequently check blood sugars at home using [facility] Check blood sugars more often if you feel it is too high or too low or if Charlize begins to feel worse Take medications as prescribed and contact Dusty's doctor's if there are medication concerns or problems Keep a close eye on Beata's skin condition now that she is not as mobile as she  used to be; this includes her bottom and her surgical wound: if her skin becomes broken or red, or you have any concerns about her surgical wound, please call [place]'s doctor's right  away Continue to follow a low sugar/ carbohydrate and low salt diet Please monitor blood pressures at home 2-3 times per week and write down the blood pressures at home: we will review these during each of our follow up phone call appointments Follow Up Plan:  Telephone follow up appointment with patient's caregiver and care management team member scheduled for:  Thursday, May 24, 2021 The patient's caregiver has been provided with contact information for the care management team and has been advised to call with any health related questions or concerns.

## 2021-05-04 NOTE — Telephone Encounter (Signed)
Clair Gulling (OT) from [employer] on behalf of patient  [name] to assess patient & noticed foot drop on left foot... patient having cognitive issues such as responding to simple commands... major coordination issues as well  Clair Gulling thinks patient may have some neurological issues taking place  Clair Gulling is also requesting a prescription order be placed for a standard walker  Req callback [phone]

## 2021-05-06 DIAGNOSIS — E11649 Type 2 diabetes mellitus with hypoglycemia without coma: Secondary | ICD-10-CM | POA: Diagnosis not present

## 2021-05-07 DIAGNOSIS — E1159 Type 2 diabetes mellitus with other circulatory complications: Secondary | ICD-10-CM | POA: Diagnosis not present

## 2021-05-07 DIAGNOSIS — D509 Iron deficiency anemia, unspecified: Secondary | ICD-10-CM | POA: Diagnosis not present

## 2021-05-07 DIAGNOSIS — R32 Unspecified urinary incontinence: Secondary | ICD-10-CM | POA: Diagnosis not present

## 2021-05-07 DIAGNOSIS — N1832 Chronic kidney disease, stage 3b: Secondary | ICD-10-CM | POA: Diagnosis not present

## 2021-05-07 DIAGNOSIS — E1122 Type 2 diabetes mellitus with diabetic chronic kidney disease: Secondary | ICD-10-CM | POA: Diagnosis not present

## 2021-05-07 DIAGNOSIS — I152 Hypertension secondary to endocrine disorders: Secondary | ICD-10-CM | POA: Diagnosis not present

## 2021-05-07 NOTE — Telephone Encounter (Signed)
Ok I see where pt has been referred to neurology on July 15  Also ok for standard walker - done hardcopy to cma

## 2021-05-07 NOTE — Telephone Encounter (Signed)
Left voicemail with Clair Gulling to see where he wanted the walker order faxed.

## 2021-05-08 ENCOUNTER — Telehealth: Payer: Self-pay

## 2021-05-08 DIAGNOSIS — R32 Unspecified urinary incontinence: Secondary | ICD-10-CM | POA: Diagnosis not present

## 2021-05-08 DIAGNOSIS — D509 Iron deficiency anemia, unspecified: Secondary | ICD-10-CM | POA: Diagnosis not present

## 2021-05-08 DIAGNOSIS — N1832 Chronic kidney disease, stage 3b: Secondary | ICD-10-CM | POA: Diagnosis not present

## 2021-05-08 DIAGNOSIS — I152 Hypertension secondary to endocrine disorders: Secondary | ICD-10-CM | POA: Diagnosis not present

## 2021-05-08 DIAGNOSIS — E1159 Type 2 diabetes mellitus with other circulatory complications: Secondary | ICD-10-CM | POA: Diagnosis not present

## 2021-05-08 DIAGNOSIS — E1122 Type 2 diabetes mellitus with diabetic chronic kidney disease: Secondary | ICD-10-CM | POA: Diagnosis not present

## 2021-05-08 NOTE — Telephone Encounter (Signed)
   Telephone encounter was:  Unsuccessful.  05/08/2021 Name: [name] MRN: [id number] DOB: [birth year]  Unsuccessful outbound call made today to assist with:   transportation and in-home care list.  Outreach Attempt:  1st Attempt  A HIPAA compliant voice message was left requesting a return call.  Instructed patient to call back at [phone].  [name], AAS Paralegal, [facility]  [address], [place] [address] ??[email or web address]  ?? [id number]   [email or web address]

## 2021-05-09 DIAGNOSIS — E1122 Type 2 diabetes mellitus with diabetic chronic kidney disease: Secondary | ICD-10-CM | POA: Diagnosis not present

## 2021-05-09 DIAGNOSIS — D509 Iron deficiency anemia, unspecified: Secondary | ICD-10-CM | POA: Diagnosis not present

## 2021-05-09 DIAGNOSIS — R32 Unspecified urinary incontinence: Secondary | ICD-10-CM | POA: Diagnosis not present

## 2021-05-09 DIAGNOSIS — I152 Hypertension secondary to endocrine disorders: Secondary | ICD-10-CM | POA: Diagnosis not present

## 2021-05-09 DIAGNOSIS — E1159 Type 2 diabetes mellitus with other circulatory complications: Secondary | ICD-10-CM | POA: Diagnosis not present

## 2021-05-09 DIAGNOSIS — N1832 Chronic kidney disease, stage 3b: Secondary | ICD-10-CM | POA: Diagnosis not present

## 2021-05-10 DIAGNOSIS — D509 Iron deficiency anemia, unspecified: Secondary | ICD-10-CM | POA: Diagnosis not present

## 2021-05-10 DIAGNOSIS — E1159 Type 2 diabetes mellitus with other circulatory complications: Secondary | ICD-10-CM | POA: Diagnosis not present

## 2021-05-10 DIAGNOSIS — I152 Hypertension secondary to endocrine disorders: Secondary | ICD-10-CM | POA: Diagnosis not present

## 2021-05-10 DIAGNOSIS — E1122 Type 2 diabetes mellitus with diabetic chronic kidney disease: Secondary | ICD-10-CM | POA: Diagnosis not present

## 2021-05-10 DIAGNOSIS — N1832 Chronic kidney disease, stage 3b: Secondary | ICD-10-CM | POA: Diagnosis not present

## 2021-05-10 DIAGNOSIS — R32 Unspecified urinary incontinence: Secondary | ICD-10-CM | POA: Diagnosis not present

## 2021-05-14 DIAGNOSIS — E1122 Type 2 diabetes mellitus with diabetic chronic kidney disease: Secondary | ICD-10-CM | POA: Diagnosis not present

## 2021-05-14 DIAGNOSIS — N1832 Chronic kidney disease, stage 3b: Secondary | ICD-10-CM | POA: Diagnosis not present

## 2021-05-14 DIAGNOSIS — R32 Unspecified urinary incontinence: Secondary | ICD-10-CM | POA: Diagnosis not present

## 2021-05-14 DIAGNOSIS — I152 Hypertension secondary to endocrine disorders: Secondary | ICD-10-CM | POA: Diagnosis not present

## 2021-05-14 DIAGNOSIS — E1159 Type 2 diabetes mellitus with other circulatory complications: Secondary | ICD-10-CM | POA: Diagnosis not present

## 2021-05-14 DIAGNOSIS — D509 Iron deficiency anemia, unspecified: Secondary | ICD-10-CM | POA: Diagnosis not present

## 2021-05-15 ENCOUNTER — Telehealth: Payer: Self-pay

## 2021-05-15 ENCOUNTER — Telehealth: Payer: Self-pay | Admitting: Internal Medicine

## 2021-05-15 NOTE — Telephone Encounter (Signed)
   Telephone encounter was:  Successful.  05/15/2021 Name: LELER DRAPER MRN: [id number] DOB: [birth year]  [name] is a 70 y.o. year old female who is a primary care patient of Jenny Reichmann, Hunt Oris, MD . The community resource team was consulted for assistance with Transportation Needs  and in-home care agency list.  Care guide performed the following interventions: Spoke with patient's husband Audria Argetsinger confirmed mailing address. IT consultant with list of in-home care and respite agencies, [facility]. I will update chart notes once patient has received resource letter.  Letter saved in Epic.  Follow Up Plan:  Care guide will follow up with patient by phone over the next 7 days

## 2021-05-15 NOTE — Telephone Encounter (Signed)
Patient husband has been notified.

## 2021-05-15 NOTE — Telephone Encounter (Signed)
Tylenol does not affect kidneys so this would be fine.  I dont otherwise have other to offer, as nsaids are not a good idea.  One might consider OTC patch such as salonpaz if a topical patch might help for the area of worst pain

## 2021-05-15 NOTE — Telephone Encounter (Signed)
   Spouse calling to report patient has no Tramadol remaining. He states most day he had to give her 2 tablets. He states the tramadol was not helping with the pain  He is requesting a refill  He would also like advice regarding her pain and the use of Tylenol

## 2021-05-15 NOTE — Telephone Encounter (Signed)
Ok we should stop the tramadol if just not working  Tylenol at 650 mg every 8hrs would be ok  I can refer to pain management if they like, as I do not normally treat with stronger medication than tramadol

## 2021-05-15 NOTE — Telephone Encounter (Signed)
Patient daughter [name] calling in  Patient is still in severe pain  Crystal wanted to confirm that it was okay to give patient tylenol as she is stage 3 w/ kidneys  Also would like information to pain management & wants to know what she can do or give patient to relieve pain in the meantime  Please follow up [phone]

## 2021-05-16 NOTE — Telephone Encounter (Signed)
Crystal notified that tylenol will not affect the kidneys per Dr. Jenny Reichmann

## 2021-05-17 ENCOUNTER — Inpatient Hospital Stay (HOSPITAL_BASED_OUTPATIENT_CLINIC_OR_DEPARTMENT_OTHER)
Admission: EM | Admit: 2021-05-17 | Discharge: 2021-05-21 | DRG: 948 | Disposition: A | Discharge status: Skilled Nursing Facility | Payer: Medicare Other | Attending: Internal Medicine | Admitting: Internal Medicine

## 2021-05-17 ENCOUNTER — Encounter (HOSPITAL_BASED_OUTPATIENT_CLINIC_OR_DEPARTMENT_OTHER): Payer: Self-pay | Admitting: *Deleted

## 2021-05-17 ENCOUNTER — Other Ambulatory Visit: Payer: Self-pay

## 2021-05-17 ENCOUNTER — Emergency Department (HOSPITAL_BASED_OUTPATIENT_CLINIC_OR_DEPARTMENT_OTHER): Payer: Medicare Other

## 2021-05-17 DIAGNOSIS — E1159 Type 2 diabetes mellitus with other circulatory complications: Secondary | ICD-10-CM | POA: Diagnosis present

## 2021-05-17 DIAGNOSIS — R627 Adult failure to thrive: Secondary | ICD-10-CM | POA: Diagnosis not present

## 2021-05-17 DIAGNOSIS — I2510 Atherosclerotic heart disease of native coronary artery without angina pectoris: Secondary | ICD-10-CM | POA: Diagnosis present

## 2021-05-17 DIAGNOSIS — W19XXXD Unspecified fall, subsequent encounter: Secondary | ICD-10-CM | POA: Diagnosis present

## 2021-05-17 DIAGNOSIS — E039 Hypothyroidism, unspecified: Secondary | ICD-10-CM | POA: Diagnosis present

## 2021-05-17 DIAGNOSIS — M25561 Pain in right knee: Secondary | ICD-10-CM | POA: Diagnosis not present

## 2021-05-17 DIAGNOSIS — N179 Acute kidney failure, unspecified: Secondary | ICD-10-CM | POA: Diagnosis not present

## 2021-05-17 DIAGNOSIS — E878 Other disorders of electrolyte and fluid balance, not elsewhere classified: Secondary | ICD-10-CM | POA: Diagnosis not present

## 2021-05-17 DIAGNOSIS — E1169 Type 2 diabetes mellitus with other specified complication: Secondary | ICD-10-CM | POA: Diagnosis not present

## 2021-05-17 DIAGNOSIS — K3184 Gastroparesis: Secondary | ICD-10-CM | POA: Diagnosis not present

## 2021-05-17 DIAGNOSIS — Z881 Allergy status to other antibiotic agents status: Secondary | ICD-10-CM

## 2021-05-17 DIAGNOSIS — K5900 Constipation, unspecified: Secondary | ICD-10-CM | POA: Diagnosis not present

## 2021-05-17 DIAGNOSIS — I152 Hypertension secondary to endocrine disorders: Secondary | ICD-10-CM | POA: Diagnosis present

## 2021-05-17 DIAGNOSIS — F09 Unspecified mental disorder due to known physiological condition: Secondary | ICD-10-CM

## 2021-05-17 DIAGNOSIS — Z20822 Contact with and (suspected) exposure to covid-19: Secondary | ICD-10-CM | POA: Diagnosis present

## 2021-05-17 DIAGNOSIS — K439 Ventral hernia without obstruction or gangrene: Secondary | ICD-10-CM | POA: Diagnosis present

## 2021-05-17 DIAGNOSIS — E1122 Type 2 diabetes mellitus with diabetic chronic kidney disease: Secondary | ICD-10-CM | POA: Diagnosis not present

## 2021-05-17 DIAGNOSIS — I700 Atherosclerosis of aorta: Secondary | ICD-10-CM | POA: Diagnosis not present

## 2021-05-17 DIAGNOSIS — Z880 Allergy status to penicillin: Secondary | ICD-10-CM

## 2021-05-17 DIAGNOSIS — D539 Nutritional anemia, unspecified: Secondary | ICD-10-CM | POA: Diagnosis present

## 2021-05-17 DIAGNOSIS — F419 Anxiety disorder, unspecified: Secondary | ICD-10-CM | POA: Diagnosis present

## 2021-05-17 DIAGNOSIS — G8929 Other chronic pain: Secondary | ICD-10-CM | POA: Diagnosis present

## 2021-05-17 DIAGNOSIS — Z794 Long term (current) use of insulin: Secondary | ICD-10-CM

## 2021-05-17 DIAGNOSIS — Z833 Family history of diabetes mellitus: Secondary | ICD-10-CM

## 2021-05-17 DIAGNOSIS — F32A Depression, unspecified: Secondary | ICD-10-CM | POA: Diagnosis present

## 2021-05-17 DIAGNOSIS — E1143 Type 2 diabetes mellitus with diabetic autonomic (poly)neuropathy: Secondary | ICD-10-CM | POA: Diagnosis present

## 2021-05-17 DIAGNOSIS — Z7401 Bed confinement status: Secondary | ICD-10-CM

## 2021-05-17 DIAGNOSIS — E785 Hyperlipidemia, unspecified: Secondary | ICD-10-CM | POA: Diagnosis present

## 2021-05-17 DIAGNOSIS — Z87891 Personal history of nicotine dependence: Secondary | ICD-10-CM

## 2021-05-17 DIAGNOSIS — F0390 Unspecified dementia without behavioral disturbance: Secondary | ICD-10-CM | POA: Diagnosis present

## 2021-05-17 DIAGNOSIS — R269 Unspecified abnormalities of gait and mobility: Secondary | ICD-10-CM

## 2021-05-17 DIAGNOSIS — Z8249 Family history of ischemic heart disease and other diseases of the circulatory system: Secondary | ICD-10-CM

## 2021-05-17 DIAGNOSIS — Z96653 Presence of artificial knee joint, bilateral: Secondary | ICD-10-CM | POA: Diagnosis present

## 2021-05-17 DIAGNOSIS — N183 Chronic kidney disease, stage 3 unspecified: Secondary | ICD-10-CM | POA: Diagnosis present

## 2021-05-17 DIAGNOSIS — Z981 Arthrodesis status: Secondary | ICD-10-CM

## 2021-05-17 DIAGNOSIS — Z79899 Other long term (current) drug therapy: Secondary | ICD-10-CM

## 2021-05-17 DIAGNOSIS — S72409D Unspecified fracture of lower end of unspecified femur, subsequent encounter for closed fracture with routine healing: Secondary | ICD-10-CM

## 2021-05-17 DIAGNOSIS — S72401D Unspecified fracture of lower end of right femur, subsequent encounter for closed fracture with routine healing: Secondary | ICD-10-CM | POA: Diagnosis not present

## 2021-05-17 DIAGNOSIS — M4807 Spinal stenosis, lumbosacral region: Secondary | ICD-10-CM | POA: Diagnosis present

## 2021-05-17 DIAGNOSIS — Z91018 Allergy to other foods: Secondary | ICD-10-CM

## 2021-05-17 DIAGNOSIS — R296 Repeated falls: Secondary | ICD-10-CM | POA: Diagnosis present

## 2021-05-17 DIAGNOSIS — R531 Weakness: Secondary | ICD-10-CM | POA: Diagnosis not present

## 2021-05-17 DIAGNOSIS — Z79890 Hormone replacement therapy (postmenopausal): Secondary | ICD-10-CM

## 2021-05-17 DIAGNOSIS — Z7982 Long term (current) use of aspirin: Secondary | ICD-10-CM

## 2021-05-17 DIAGNOSIS — D649 Anemia, unspecified: Secondary | ICD-10-CM | POA: Diagnosis present

## 2021-05-17 DIAGNOSIS — M62838 Other muscle spasm: Secondary | ICD-10-CM | POA: Diagnosis present

## 2021-05-17 DIAGNOSIS — K219 Gastro-esophageal reflux disease without esophagitis: Secondary | ICD-10-CM | POA: Diagnosis present

## 2021-05-17 LAB — CBC WITH DIFFERENTIAL/PLATELET
Abs Immature Granulocytes: 0.02 10*3/uL (ref 0.00–0.07)
Basophils Absolute: 0 10*3/uL (ref 0.0–0.1)
Basophils Relative: 0 %
Eosinophils Absolute: 0.1 10*3/uL (ref 0.0–0.5)
Eosinophils Relative: 2 %
HCT: 31.2 % — ABNORMAL LOW (ref 36.0–46.0)
Hemoglobin: 10.2 g/dL — ABNORMAL LOW (ref 12.0–15.0)
Immature Granulocytes: 0 %
Lymphocytes Relative: 31 %
Lymphs Abs: 2.2 10*3/uL (ref 0.7–4.0)
MCH: 34 pg (ref 26.0–34.0)
MCHC: 32.7 g/dL (ref 30.0–36.0)
MCV: 104 fL — ABNORMAL HIGH (ref 80.0–100.0)
Monocytes Absolute: 0.4 10*3/uL (ref 0.1–1.0)
Monocytes Relative: 6 %
Neutro Abs: 4.2 10*3/uL (ref 1.7–7.7)
Neutrophils Relative %: 61 %
Platelets: 270 10*3/uL (ref 150–400)
RBC: 3 MIL/uL — ABNORMAL LOW (ref 3.87–5.11)
RDW: 14.6 % (ref 11.5–15.5)
WBC: 7 10*3/uL (ref 4.0–10.5)
nRBC: 0 % (ref 0.0–0.2)

## 2021-05-17 LAB — URINALYSIS, MICROSCOPIC (REFLEX): RBC / HPF: NONE SEEN RBC/hpf (ref 0–5)

## 2021-05-17 LAB — COMPREHENSIVE METABOLIC PANEL
ALT: 13 U/L (ref 0–44)
AST: 18 U/L (ref 15–41)
Albumin: 3.3 g/dL — ABNORMAL LOW (ref 3.5–5.0)
Alkaline Phosphatase: 76 U/L (ref 38–126)
Anion gap: 10 (ref 5–15)
BUN: 12 mg/dL (ref 8–23)
CO2: 14 mmol/L — ABNORMAL LOW (ref 22–32)
Calcium: 9.4 mg/dL (ref 8.9–10.3)
Chloride: 113 mmol/L — ABNORMAL HIGH (ref 98–111)
Creatinine, Ser: 1.1 mg/dL — ABNORMAL HIGH (ref 0.44–1.00)
GFR, Estimated: 54 mL/min — ABNORMAL LOW (ref >60)
Glucose, Bld: 159 mg/dL — ABNORMAL HIGH (ref 70–99)
Potassium: 3.5 mmol/L (ref 3.5–5.1)
Sodium: 137 mmol/L (ref 135–145)
Total Bilirubin: 0.6 mg/dL (ref 0.3–1.2)
Total Protein: 6.7 g/dL (ref 6.5–8.1)

## 2021-05-17 LAB — URINALYSIS, ROUTINE W REFLEX MICROSCOPIC
Bilirubin Urine: NEGATIVE
Glucose, UA: NEGATIVE mg/dL
Hgb urine dipstick: NEGATIVE
Ketones, ur: NEGATIVE mg/dL
Nitrite: NEGATIVE
Protein, ur: NEGATIVE mg/dL
Specific Gravity, Urine: 1.01 (ref 1.005–1.030)
pH: 6 (ref 5.0–8.0)

## 2021-05-17 LAB — I-STAT VENOUS BLOOD GAS, ED
Acid-base deficit: 10 mmol/L — ABNORMAL HIGH (ref 0.0–2.0)
Bicarbonate: 13.5 mmol/L — ABNORMAL LOW (ref 20.0–28.0)
Calcium, Ion: 1.27 mmol/L (ref 1.15–1.40)
HCT: 30 % — ABNORMAL LOW (ref 36.0–46.0)
Hemoglobin: 10.2 g/dL — ABNORMAL LOW (ref 12.0–15.0)
O2 Saturation: 68 %
Patient temperature: 98.6
Potassium: 3.5 mmol/L (ref 3.5–5.1)
Sodium: 140 mmol/L (ref 135–145)
TCO2: 14 mmol/L — ABNORMAL LOW (ref 22–32)
pCO2, Ven: 22.7 mmHg — ABNORMAL LOW (ref 44.0–60.0)
pH, Ven: 7.382 (ref 7.250–7.430)
pO2, Ven: 35 mmHg (ref 32.0–45.0)

## 2021-05-17 LAB — RESP PANEL BY RT-PCR (FLU A&B, COVID) ARPGX2
Influenza A by PCR: NEGATIVE
Influenza B by PCR: NEGATIVE
SARS Coronavirus 2 by RT PCR: NEGATIVE

## 2021-05-17 LAB — SALICYLATE LEVEL: Salicylate Lvl: <7 mg/dL — ABNORMAL LOW (ref 7.0–30.0)

## 2021-05-17 LAB — LACTIC ACID, PLASMA: Lactic Acid, Venous: 1.5 mmol/L (ref 0.5–1.9)

## 2021-05-17 LAB — LIPASE, BLOOD: Lipase: 31 U/L (ref 11–51)

## 2021-05-17 MED ORDER — SODIUM CHLORIDE 0.9 % IV BOLUS
1000.0000 mL | Freq: Once | INTRAVENOUS | Status: AC
  Administered 2021-05-17: 1000 mL via INTRAVENOUS

## 2021-05-17 MED ORDER — FENTANYL CITRATE (PF) 100 MCG/2ML IJ SOLN
50.0000 ug | Freq: Once | INTRAMUSCULAR | Status: AC
  Administered 2021-05-17: 50 ug via INTRAVENOUS
  Filled 2021-05-17: qty 2

## 2021-05-17 MED ORDER — ZOLPIDEM TARTRATE 5 MG PO TABS
2.5000 mg | ORAL_TABLET | Freq: Once | ORAL | Status: AC
  Administered 2021-05-18: 2.5 mg via ORAL
  Filled 2021-05-17: qty 1

## 2021-05-17 NOTE — ED Notes (Signed)
MD notified of patients continued pain.  No new orders at this time

## 2021-05-17 NOTE — ED Notes (Signed)
Called to lobby at 1545 to help get someone out of car, pt lying down in back of car, daughters report they got her in the car, with a hoyer lift. Hoyer lift brought out to car attempt to use unsuccessful lift  unable to get close enough to attach to pt in car . Slide board  used to move pt from car to stretcher with 5 staff  assist. Pt taken to room 4 for triage

## 2021-05-17 NOTE — ED Triage Notes (Addendum)
Brought in by family for right knee pain and constipation. Femur Fx surgery 04/16/2021, Family reports went to ortho f/u appt today , but was unable to get pt out of care d/t no hoyer lift available.

## 2021-05-17 NOTE — Progress Notes (Addendum)
CSW spoke with patient's husband [name] who was unaware of the patient's presence at [facility]. CSW explained to him the reason why his wife was in the ED - he states understanding. [name] states patient is receiving home health services (PT and OT) from [facility]. [name] states his wife had her knee surgery done by [employer]. [name] states his wife was not placed into a facility after surgery due to "nobody accepting her." [name] states he does not believe his wife needs placement in SNF that she is doing well with therapies at home. [name] is agreeable to SNF placement only if the facility is rated 3, 4, or 5 stars. [name] requesting his wife's constipation be addressed also. CSW explained that patient would be seen by a physician and physical therapy for their recommendations.  CSW spoke with patient's daughter Donella Stade who states she is HCPOA. Crystal states her mother has vertigo and has had frequent falls recently. Crystal states she is not agreeable for placement unless the facility is rated 5 stars. Crystal and CSW discussed requesting additional home health services from PCP or pursue private duty services.   Madilyn Fireman, MSW, LCSW Transitions of Care  Clinical Social Worker II [phone]

## 2021-05-17 NOTE — ED Provider Notes (Signed)
[facility] EMERGENCY DEPARTMENT Provider Note   CSN: [id number] Arrival date & time: 05/17/21  1627     History Chief Complaint  Patient presents with   Constipation    [name] is a 70 y.o. female.  Patient arrives with abdominal pain, concern for constipation, right thigh pain.  Recent surgery to fix distal right femur fracture.  Has been at home since hospital admission.  Was not able to get placed to a short-term care facility for some reason.  PT has been out of the house starting last week but she has been basically bedbound since her fall and fracture.  She has not had a bowel movement.  She continues to have some intermittent right thigh pain as well.  The history is provided by the patient.  Constipation Associated symptoms: abdominal pain   Associated symptoms: no back pain, no dysuria, no fever and no vomiting   Abdominal Pain Pain location:  Generalized Pain quality: aching   Pain radiates to:  Does not radiate Pain severity:  Mild Timing:  Intermittent Progression:  Waxing and waning Chronicity:  New Relieved by:  Nothing Worsened by:  Nothing Associated symptoms: constipation   Associated symptoms: no chest pain, no chills, no cough, no dysuria, no fever, no hematuria, no shortness of breath, no sore throat and no vomiting   Risk factors: multiple surgeries       Past Medical History:  Diagnosis Date   Anxiety    Arthritis    Blood dyscrasia    "FREE BLEEDER"   CERVICAL RADICULOPATHY, LEFT    Chronic back pain    CKD (chronic kidney disease)    DR. SANFORD  [facility]   COMMON MIGRAINE    CORONARY ARTERY DISEASE    Cough    CURRENT COLD   Cystitis    DEPRESSION    Diabetes mellitus, type II ([place])    DIVERTICULOSIS-COLON    Dysrhythmia    palpitations   Gastric ulcer 04/2008   Gastroparesis    GERD (gastroesophageal reflux disease)    Hiatal hernia    Hyperlipidemia    Hypertension    Hypothyroidism    INSOMNIA-SLEEP  DISORDER-UNSPEC    Iron deficiency anemia    Wears glasses     Patient Active Problem List   Diagnosis Date Noted   Generalized weakness 05/17/2021   Femur fracture ([place]) 05/01/2021   Bilateral hand pain 05/01/2021   Weight loss 04/15/2021   Leukocytosis 04/15/2021   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   Acute diverticulitis 03/21/2021   Degenerative disc disease, cervical 02/08/2021   Type 2 diabetes mellitus ([place]) 02/02/2021   Acquired hypothyroidism 11/24/2020   Chronic pain 11/24/2020   Severe sepsis ([place]) 11/09/2020   Hyperlipidemia associated with type 2 diabetes mellitus ([place]) 99991111   Acute metabolic encephalopathy 99991111   Acute lower UTI 11/09/2020   Recurrent falls 10/19/2020   Gait disorder 10/11/2020   Fall 10/11/2020   Low grade fever 02/04/2020   Chest pain 01/26/2020   Lumbar post-laminectomy syndrome 10/05/2019   Spinal cord stimulator status 10/05/2019   Body mass index (BMI) 40.0-44.9, adult ([place]) 10/05/2019   Rectal bleeding 09/01/2019   History of peptic ulcer disease 09/01/2019   Screening for viral disease 09/01/2019   Insulin dependent type 2 diabetes mellitus ([place]) 09/01/2019   Pyelonephritis 06/05/2019   Recurrent UTI 06/05/2019   Urinary incontinence 06/05/2019   Nausea & vomiting 02/08/2019   Abdominal pain 02/08/2019   Constipation 02/08/2019   UTI (urinary  tract infection) 02/08/2019   LUQ pain 01/21/2019   Cervical radiculopathy at C6 01/28/2018   Left lateral epicondylitis 01/23/2018   Obesity 01/23/2018   Weakness 05/01/2017   Hyperkalemia 05/01/2017   Acute kidney injury superimposed on CKD ([place]) 05/01/2017   Essential (primary) hypertension 05/01/2017   Sepsis ([place]) 05/01/2017   Dysuria 04/23/2017   Right leg swelling 12/27/2016   Leg pain, lateral, right 11/05/2016   Radiculopathy 04/03/2016   Hypersomnolence 11/23/2015   Right sided abdominal pain 07/25/2015   Malaise and fatigue 11/05/2014   Rotator cuff syndrome  07/26/2014   Fracture of fifth metacarpal bone of right hand 07/08/2014   Right cervical radiculopathy 06/27/2014   Chronic interstitial cystitis 05/23/2014   Osteoarthritis of CMC joint of thumb 04/11/2014   De Quervain's tenosynovitis, left 03/14/2014   CKD (chronic kidney disease), stage III ([place]) 03/03/2014   Lower back pain 03/03/2014   Ingrown nail 03/03/2014   Peripheral edema 02/23/2014   Lateral epicondylitis of right elbow 01/31/2014   Neck pain on left side 11/19/2013   Abdominal discomfort 10/12/2012   Right lumbar radiculopathy 05/11/2012   Peripheral neuropathy 05/11/2012   Vertigo 03/29/2012   Headache(784.0) 03/10/2012   Left lower quadrant abdominal pain 10/24/2011   Lumbar radiculopathy 10/24/2011   Hypothyroidism 08/19/2011   Right leg weakness 04/04/2011   Hematochezia 01/07/2011   Preventative health care 01/07/2011   CHOLELITHIASIS 05/04/2009   GERD 04/26/2009   Gastroparesis 04/26/2009   ULCER-GASTRIC 03/15/2009   DIVERTICULOSIS-COLON 03/15/2009   Cervical radiculopathy 10/28/2008   MENOPAUSAL DISORDER 06/29/2008   Pain in joint, shoulder region 10/12/2007   INSOMNIA-SLEEP DISORDER-UNSPEC 09/01/2007   Depression 09/01/2007   Macrocytic anemia 08/13/2007   COMMON MIGRAINE 08/13/2007   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   Diabetes ([place]) 05/06/2007   HLD (hyperlipidemia) 05/06/2007   ANXIETY 05/06/2007   Hypertension associated with diabetes ([place]) 05/06/2007   Coronary atherosclerosis 05/06/2007    Past Surgical History:  Procedure Laterality Date   ABDOMINAL HYSTERECTOMY     ANTERIOR LAT LUMBAR FUSION Left 08/13/2017   Procedure: LEFT SIDED LUMBAR 3-4 LATERAL INTERBODY FUSION WITH INSTRUMENTATION AND ALLOGRAFT;  Surgeon: Phylliss Bob, MD;  Location: [facility];  Service: Orthopedics;  Laterality: Left;  LEFT SIDED LUMBAR 3-4 LATERAL INTERBODY FUSION WITH INSTRUMENTATION AND ALLOGRAFT; REQUEST 3 HOURS   Back Stimulator  07/2018   BACK SURGERY  03/2016    BREAST EXCISIONAL BIOPSY Right    40 years ago   CHOLECYSTECTOMY  06/2009   COLONOSCOPY     ESOPHAGOGASTRODUODENOSCOPY     EYE SURGERY Bilateral    lasik   Gastric Wedge resection lipoma  11/2007   x2 with laparotomy and gastrostomy   JOINT REPLACEMENT     Left knee replacement     ORIF FEMUR FRACTURE Right 04/16/2021   Procedure: OPEN REDUCTION INTERNAL FIXATION (ORIF) DISTAL FEMUR FRACTURE;  Surgeon: Shona Needles, MD;  Location: [facility];  Service: Orthopedics;  Laterality: Right;   Rigth knee replacement with revision  04/2008   Dr. Berenice Primas   ROTATOR CUFF REPAIR Left 01/2009   s/p bladder surgury  09/2009   Dr. Terance Hart   SPINAL CORD STIMULATOR INSERTION N/A 09/11/2018   Procedure: LUMBAR SPINAL CORD STIMULATOR INSERTION;  Surgeon: Clydell Hakim, MD;  Location: [facility];  Service: Neurosurgery;  Laterality: N/A;  LUMBAR SPINAL CORD STIMULATOR INSERTION     OB History     Gravida  3   Para  3   Term  Preterm      AB      Living  3      SAB      IAB      Ectopic      Multiple      Live Births              Family History  Problem Relation Age of Onset   Diabetes Sister        x 3   Heart disease Sister        x2   Breast cancer Sister 53   Diabetes Brother        x3   Heart disease Brother        x2   Coronary artery disease Other        female 1st degree   Hypertension Other    Breast cancer Sister 11   Breast cancer Other 25   Diabetes Brother    Colon cancer Neg Hx     Social History   Tobacco Use   Smoking status: Former   Smokeless tobacco: Never   Tobacco comments:    quit 30 years ago  Vaping Use   Vaping Use: Never used  Substance Use Topics   Alcohol use: No    Alcohol/week: 0.0 standard drinks   Drug use: No    Home Medications Prior to Admission medications   Medication Sig Start Date End Date Taking? Authorizing Provider  amLODipine (NORVASC) 10 MG tablet Take 1 tablet (10 mg total) by mouth daily. 04/25/21   Shelly Coss, MD  aspirin 81 MG EC tablet Take 1 tablet (81 mg total) by mouth 2 (two) times daily. After one month,continue taking once a day 04/24/21   Shelly Coss, MD  atorvastatin (LIPITOR) 40 MG tablet TAKE ONE TABLET BY MOUTH EVERY NIGHT AT BEDTIME 06/13/20   Biagio Borg, MD  baclofen (LIORESAL) 10 MG tablet Take 1 tablet (10 mg total) by mouth 3 (three) times daily. 05/01/21   Biagio Borg, MD  cloNIDine (CATAPRES) 0.2 MG tablet Take 1 tablet (0.2 mg total) by mouth 2 (two) times daily. 04/24/21   Shelly Coss, MD  Continuous Blood Gluc Receiver (FREESTYLE LIBRE 2 READER) DEVI Use as directed twice daily E11.9 10/11/20   Biagio Borg, MD  Continuous Blood Gluc Sensor (FREESTYLE LIBRE 2 SENSOR) MISC Use as directed once bi-weekly E11.9 10/11/20   Biagio Borg, MD  Dulaglutide (TRULICITY) 1.5 0000000 SOPN Inject 1.5 mg into the skin every Monday. 02/02/21   [provider]  ELMIRON 100 MG capsule Take 200 mg by mouth 2 (two) times daily.  07/20/13   [provider]  fexofenadine (ALLEGRA) 180 MG tablet Take 1 tablet (180 mg total) by mouth daily. 05/01/21 05/01/22  Biagio Borg, MD  FLUoxetine (PROZAC) 20 MG capsule TAKE ONE CAPSULE BY MOUTH DAILY 11/13/20   Biagio Borg, MD  glucose blood (FREESTYLE TEST STRIPS) test strip Use as instructed to check blood sugar 4 times daily. 04/20/18   Elayne Snare, MD  glucose blood test strip Use as instructed 12/21/20   Elayne Snare, MD  Insulin Glargine [facility]) 100 UNIT/ML Inject 30 Units into the skin daily. 02/02/21   [provider]  insulin lispro (HUMALOG) 100 UNIT/ML injection Inject 3-20 Units into the skin See admin instructions. Per sliding scale 3 times daily    [provider]  isosorbide mononitrate (IMDUR) 60 MG 24 hr tablet Take 1 tablet (60  mg total) by mouth daily. 02/21/20   Biagio Borg, MD  levothyroxine (SYNTHROID) 50 MCG tablet Take 1 tablet (50 mcg total) by mouth daily at 6 (six) AM. 04/25/21    Shelly Coss, MD  linaclotide [facility]) 72 MCG capsule Take 1 capsule (72 mcg total) by mouth daily before breakfast. 11/13/20   Biagio Borg, MD  Loteprednol Etabonate (LOTEMAX SM) 0.38 % GEL Place 1 drop into both eyes 3 (three) times daily. 11/29/20   [provider]  megestrol (MEGACE) 40 MG tablet Take 1 tablet (40 mg total) by mouth daily. 05/03/21   Biagio Borg, MD  metoprolol succinate (TOPROL-XL) 50 MG 24 hr tablet TAKE 1 TABLET BY MOUTH DAILY WITH OR IMMEDIATELY FOLLOWING A MEAL 03/29/21   Biagio Borg, MD  nitroGLYCERIN (NITROSTAT) 0.4 MG SL tablet Place 1 tablet (0.4 mg total) under the tongue every 5 (five) minutes as needed for chest pain. 01/27/20   Ghimire, Henreitta Leber, MD  ondansetron (ZOFRAN ODT) 4 MG disintegrating tablet Take 1 tablet (4 mg total) by mouth every 8 (eight) hours as needed for up to 15 doses for nausea or vomiting. 03/21/21   [name], MD  pantoprazole (PROTONIX) 40 MG tablet TAKE 1 TABLET(40 MG) BY MOUTH TWICE DAILY 11/13/20   Biagio Borg, MD  polyethylene glycol (MIRALAX / GLYCOLAX) 17 g packet Take 17 g by mouth daily as needed. 01/27/20   Ghimire, Henreitta Leber, MD  traMADol (ULTRAM) 50 MG tablet Take 1 tablet (50 mg total) by mouth every 6 (six) hours as needed for moderate pain or severe pain. 05/01/21   Biagio Borg, MD  enoxaparin (LOVENOX) 40 MG/0.4ML injection Inject 0.4 mLs (40 mg total) into the skin daily. 04/18/21 04/24/21  Delray Alt, PA-C    Allergies    Ciprofloxacin, Hydrocodone, Hydrocortisone, and Penicillins  Review of Systems   Review of Systems  Constitutional:  Negative for chills and fever.  HENT:  Negative for ear pain and sore throat.   Eyes:  Negative for pain and visual disturbance.  Respiratory:  Negative for cough and shortness of breath.   Cardiovascular:  Negative for chest pain and palpitations.  Gastrointestinal:  Positive for abdominal pain and constipation. Negative for vomiting.  Genitourinary:  Negative  for dysuria and hematuria.  Musculoskeletal:  Negative for arthralgias and back pain.  Skin:  Negative for color change and rash.  Neurological:  Negative for seizures and syncope.  All other systems reviewed and are negative.  Physical Exam Updated Vital Signs BP (!) 151/91   Pulse 90   Temp 98.4 F (36.9 C)   Resp 18   Ht 5' 5.5" (1.664 m)   Wt 119.3 kg   SpO2 100%   BMI 43.10 kg/m   Physical Exam Vitals and nursing note reviewed.  Constitutional:      General: She is not in acute distress.    Appearance: She is well-developed.  HENT:     Head: Normocephalic and atraumatic.     Nose: Nose normal.     Mouth/Throat:     Mouth: Mucous membranes are moist.  Eyes:     Extraocular Movements: Extraocular movements intact.     Conjunctiva/sclera: Conjunctivae normal.     Pupils: Pupils are equal, round, and reactive to light.  Cardiovascular:     Rate and Rhythm: Normal rate and regular rhythm.     Pulses: Normal pulses.     Heart sounds: No murmur heard. Pulmonary:  Effort: Pulmonary effort is normal. No respiratory distress.     Breath sounds: Normal breath sounds.  Abdominal:     Palpations: Abdomen is soft.     Tenderness: There is abdominal tenderness.  Musculoskeletal:        General: No tenderness. Normal range of motion.     Cervical back: Normal range of motion and neck supple.  Skin:    General: Skin is warm and dry.     Capillary Refill: Capillary refill takes less than 2 seconds.  Neurological:     General: No focal deficit present.     Mental Status: She is alert.     Sensory: No sensory deficit.     Comments: Generalized weakness in her lower legs with symmetric/likely from deconditioning    ED Results / Procedures / Treatments   Labs (all labs ordered are listed, but only abnormal results are displayed) Labs Reviewed  CBC WITH DIFFERENTIAL/PLATELET - Abnormal; Notable for the following components:      Result Value   RBC 3.00 (*)     Hemoglobin 10.2 (*)    HCT 31.2 (*)    MCV 104.0 (*)    All other components within normal limits  COMPREHENSIVE METABOLIC PANEL - Abnormal; Notable for the following components:   Chloride 113 (*)    CO2 14 (*)    Glucose, Bld 159 (*)    Creatinine, Ser 1.10 (*)    Albumin 3.3 (*)    GFR, Estimated 54 (*)    All other components within normal limits  I-STAT VENOUS BLOOD GAS, ED - Abnormal; Notable for the following components:   pCO2, Ven 22.7 (*)    Bicarbonate 13.5 (*)    TCO2 14 (*)    Acid-base deficit 10.0 (*)    HCT 30.0 (*)    Hemoglobin 10.2 (*)    All other components within normal limits  RESP PANEL BY RT-PCR (FLU A&B, COVID) ARPGX2  LIPASE, BLOOD  LACTIC ACID, PLASMA  LACTIC ACID, PLASMA  SALICYLATE LEVEL    EKG None  Radiology CT ABDOMEN PELVIS WO CONTRAST  Result Date: 05/17/2021 CLINICAL DATA:  Right knee pain and constipation. Previous orthopedic surgery right femur. EXAM: CT ABDOMEN AND PELVIS WITHOUT CONTRAST TECHNIQUE: Multidetector CT imaging of the abdomen and pelvis was performed following the standard protocol without IV contrast. COMPARISON:  04/15/2021 FINDINGS: Lower chest: Lung bases are clear.  Spinal neurostimulator in place. Hepatobiliary: Liver parenchyma appears normal without contrast. Previous cholecystectomy. Pancreas: Normal Spleen: Normal Adrenals/Urinary Tract: Adrenal glands are normal. Kidneys are normal without contrast. Bladder is normal. Stomach/Bowel: Previous gastric surgery. No acute finding. Small bowel pattern is normal. Moderate amount of fecal matter in the colon that could go along with constipation, with a larger amount in the rectosigmoid region. No sign of diverticulosis or diverticulitis. Vascular/Lymphatic: Aortic atherosclerosis. No aneurysm. IVC is normal. No adenopathy. Reproductive: Previous hysterectomy.  No pelvic mass. Other: No free fluid or air. Left lateral abdominal wall hernia and left inguinal hernia containing fat  as seen previously. Musculoskeletal: No acute bone finding. Previous lumbar region discectomy and fusion. IMPRESSION: Patient does have a large amount of fecal matter throughout the colon, particularly in the rectosigmoid region, which could go along with the clinical diagnosis of constipation. No acute finding otherwise. Electronically Signed   By: Nelson Chimes M.D.   On: 05/17/2021 17:50   DG Femur Min 2 Views Right  Result Date: 05/17/2021 CLINICAL DATA:  Right knee pain surgery EXAM: RIGHT  FEMUR 2 VIEWS COMPARISON:  04/16/2021 FINDINGS: Previous ORIF with lateral femoral plate and screws. Previous knee replacement. Fracture line of the distal femur remains visible but is not changed in position or alignment. No unexpected or acute finding. IMPRESSION: No change or unexpected finding. Healing fracture of the distal femur. Electronically Signed   By: Nelson Chimes M.D.   On: 05/17/2021 17:47    Procedures Procedures   Medications Ordered in ED Medications  sodium chloride 0.9 % bolus 1,000 mL (1,000 mLs Intravenous New Bag/Given 05/17/21 1739)    ED Course  I have reviewed the triage vital signs and the nursing notes.  Pertinent labs & imaging results that were available during my care of the patient were reviewed by me and considered in my medical decision making (see chart for details).    MDM Rules/Calculators/A&P                           [name] is here with abdominal pain and constipation.  Recent right femur fracture status post surgery.  She has been having intermittent pain in her fracture site but taking Tylenol.  She has not had a bowel movement for several days.  She has not had any nausea or vomiting.  Physical therapy has come out to the house and she is currently using a wheelchair.  Patient was recommended to go to a skilled nursing facility but was not placed per family.  They are working on getting more support at home and working with the primary care doctor.    Bicarb level is 14.  However blood gas normal.  No anion gap.  Kidney function unremarkable.  Suspect that this could be from dehydration as chloride is 113.  CT scan showed constipation but no other acute process.  Femur x-ray was unremarkable.  Overall suspect that she is dehydrated and having poor nutrition.  Some of this could be respiratory alkalosis as well but not sure.  We will add a salicylate level but she denies taking any aspirin and has been just been taking Tylenol for pain.  Overall she is to be admitted to medicine for further hydration, PT OT and may be placement as I believe social work and case management are involved.  This chart was dictated using voice recognition software.  Despite best efforts to proofread,  errors can occur which can change the documentation meaning.   Final Clinical Impression(s) / ED Diagnoses Final diagnoses:  Constipation, unspecified constipation type  Failure to thrive in adult  Low bicarbonate level    Rx / DC Orders ED Discharge Orders     None        Lennice Sites, DO 05/17/21 1836

## 2021-05-18 ENCOUNTER — Ambulatory Visit: Payer: Medicare Other | Admitting: Podiatry

## 2021-05-18 DIAGNOSIS — R1312 Dysphagia, oropharyngeal phase: Secondary | ICD-10-CM | POA: Diagnosis not present

## 2021-05-18 DIAGNOSIS — Z9181 History of falling: Secondary | ICD-10-CM | POA: Diagnosis not present

## 2021-05-18 DIAGNOSIS — Z91018 Allergy to other foods: Secondary | ICD-10-CM | POA: Diagnosis not present

## 2021-05-18 DIAGNOSIS — R531 Weakness: Principal | ICD-10-CM

## 2021-05-18 DIAGNOSIS — K5900 Constipation, unspecified: Secondary | ICD-10-CM | POA: Diagnosis present

## 2021-05-18 DIAGNOSIS — K439 Ventral hernia without obstruction or gangrene: Secondary | ICD-10-CM | POA: Diagnosis present

## 2021-05-18 DIAGNOSIS — W19XXXD Unspecified fall, subsequent encounter: Secondary | ICD-10-CM | POA: Diagnosis present

## 2021-05-18 DIAGNOSIS — M4807 Spinal stenosis, lumbosacral region: Secondary | ICD-10-CM | POA: Diagnosis present

## 2021-05-18 DIAGNOSIS — S72491D Other fracture of lower end of right femur, subsequent encounter for closed fracture with routine healing: Secondary | ICD-10-CM | POA: Diagnosis not present

## 2021-05-18 DIAGNOSIS — M5410 Radiculopathy, site unspecified: Secondary | ICD-10-CM | POA: Diagnosis not present

## 2021-05-18 DIAGNOSIS — D539 Nutritional anemia, unspecified: Secondary | ICD-10-CM | POA: Diagnosis present

## 2021-05-18 DIAGNOSIS — E1169 Type 2 diabetes mellitus with other specified complication: Secondary | ICD-10-CM | POA: Diagnosis present

## 2021-05-18 DIAGNOSIS — R5383 Other fatigue: Secondary | ICD-10-CM | POA: Diagnosis not present

## 2021-05-18 DIAGNOSIS — S72409D Unspecified fracture of lower end of unspecified femur, subsequent encounter for closed fracture with routine healing: Secondary | ICD-10-CM | POA: Diagnosis not present

## 2021-05-18 DIAGNOSIS — R2689 Other abnormalities of gait and mobility: Secondary | ICD-10-CM | POA: Diagnosis not present

## 2021-05-18 DIAGNOSIS — Z7401 Bed confinement status: Secondary | ICD-10-CM | POA: Diagnosis not present

## 2021-05-18 DIAGNOSIS — E878 Other disorders of electrolyte and fluid balance, not elsewhere classified: Secondary | ICD-10-CM | POA: Diagnosis not present

## 2021-05-18 DIAGNOSIS — F09 Unspecified mental disorder due to known physiological condition: Secondary | ICD-10-CM | POA: Diagnosis present

## 2021-05-18 DIAGNOSIS — Z20822 Contact with and (suspected) exposure to covid-19: Secondary | ICD-10-CM | POA: Diagnosis present

## 2021-05-18 DIAGNOSIS — I129 Hypertensive chronic kidney disease with stage 1 through stage 4 chronic kidney disease, or unspecified chronic kidney disease: Secondary | ICD-10-CM | POA: Diagnosis not present

## 2021-05-18 DIAGNOSIS — F32A Depression, unspecified: Secondary | ICD-10-CM | POA: Diagnosis present

## 2021-05-18 DIAGNOSIS — F0390 Unspecified dementia without behavioral disturbance: Secondary | ICD-10-CM | POA: Diagnosis present

## 2021-05-18 DIAGNOSIS — E1122 Type 2 diabetes mellitus with diabetic chronic kidney disease: Secondary | ICD-10-CM | POA: Diagnosis present

## 2021-05-18 DIAGNOSIS — R2681 Unsteadiness on feet: Secondary | ICD-10-CM | POA: Diagnosis not present

## 2021-05-18 DIAGNOSIS — R296 Repeated falls: Secondary | ICD-10-CM | POA: Diagnosis present

## 2021-05-18 DIAGNOSIS — I2510 Atherosclerotic heart disease of native coronary artery without angina pectoris: Secondary | ICD-10-CM | POA: Diagnosis present

## 2021-05-18 DIAGNOSIS — Z881 Allergy status to other antibiotic agents status: Secondary | ICD-10-CM | POA: Diagnosis not present

## 2021-05-18 DIAGNOSIS — E785 Hyperlipidemia, unspecified: Secondary | ICD-10-CM | POA: Diagnosis present

## 2021-05-18 DIAGNOSIS — I152 Hypertension secondary to endocrine disorders: Secondary | ICD-10-CM | POA: Diagnosis present

## 2021-05-18 DIAGNOSIS — M545 Low back pain, unspecified: Secondary | ICD-10-CM | POA: Diagnosis not present

## 2021-05-18 DIAGNOSIS — K3184 Gastroparesis: Secondary | ICD-10-CM | POA: Diagnosis present

## 2021-05-18 DIAGNOSIS — N183 Chronic kidney disease, stage 3 unspecified: Secondary | ICD-10-CM | POA: Diagnosis present

## 2021-05-18 DIAGNOSIS — R41841 Cognitive communication deficit: Secondary | ICD-10-CM | POA: Diagnosis not present

## 2021-05-18 DIAGNOSIS — M62838 Other muscle spasm: Secondary | ICD-10-CM | POA: Diagnosis present

## 2021-05-18 DIAGNOSIS — M6281 Muscle weakness (generalized): Secondary | ICD-10-CM | POA: Diagnosis not present

## 2021-05-18 DIAGNOSIS — G629 Polyneuropathy, unspecified: Secondary | ICD-10-CM | POA: Diagnosis not present

## 2021-05-18 DIAGNOSIS — Z880 Allergy status to penicillin: Secondary | ICD-10-CM | POA: Diagnosis not present

## 2021-05-18 DIAGNOSIS — R410 Disorientation, unspecified: Secondary | ICD-10-CM | POA: Diagnosis not present

## 2021-05-18 DIAGNOSIS — E1143 Type 2 diabetes mellitus with diabetic autonomic (poly)neuropathy: Secondary | ICD-10-CM | POA: Diagnosis present

## 2021-05-18 DIAGNOSIS — E039 Hypothyroidism, unspecified: Secondary | ICD-10-CM | POA: Diagnosis present

## 2021-05-18 DIAGNOSIS — R4182 Altered mental status, unspecified: Secondary | ICD-10-CM | POA: Diagnosis not present

## 2021-05-18 LAB — COMPREHENSIVE METABOLIC PANEL
ALT: 14 U/L (ref 0–44)
AST: 16 U/L (ref 15–41)
Albumin: 3.4 g/dL — ABNORMAL LOW (ref 3.5–5.0)
Alkaline Phosphatase: 75 U/L (ref 38–126)
Anion gap: 10 (ref 5–15)
BUN: 11 mg/dL (ref 8–23)
CO2: 17 mmol/L — ABNORMAL LOW (ref 22–32)
Calcium: 9.7 mg/dL (ref 8.9–10.3)
Chloride: 114 mmol/L — ABNORMAL HIGH (ref 98–111)
Creatinine, Ser: 0.94 mg/dL (ref 0.44–1.00)
GFR, Estimated: >60 mL/min (ref >60)
Glucose, Bld: 99 mg/dL (ref 70–99)
Potassium: 3.5 mmol/L (ref 3.5–5.1)
Sodium: 141 mmol/L (ref 135–145)
Total Bilirubin: 0.9 mg/dL (ref 0.3–1.2)
Total Protein: 6.7 g/dL (ref 6.5–8.1)

## 2021-05-18 LAB — FOLATE: Folate: 20.9 ng/mL (ref >5.9)

## 2021-05-18 LAB — CBC
HCT: 32 % — ABNORMAL LOW (ref 36.0–46.0)
Hemoglobin: 10.1 g/dL — ABNORMAL LOW (ref 12.0–15.0)
MCH: 33.3 pg (ref 26.0–34.0)
MCHC: 31.6 g/dL (ref 30.0–36.0)
MCV: 105.6 fL — ABNORMAL HIGH (ref 80.0–100.0)
Platelets: 304 10*3/uL (ref 150–400)
RBC: 3.03 MIL/uL — ABNORMAL LOW (ref 3.87–5.11)
RDW: 14.4 % (ref 11.5–15.5)
WBC: 9 10*3/uL (ref 4.0–10.5)
nRBC: 0 % (ref 0.0–0.2)

## 2021-05-18 LAB — VITAMIN B12: Vitamin B-12: 1515 pg/mL — ABNORMAL HIGH (ref 180–914)

## 2021-05-18 LAB — GLUCOSE, CAPILLARY
Glucose-Capillary: 99 mg/dL (ref 70–99)
Glucose-Capillary: 114 mg/dL — ABNORMAL HIGH (ref 70–99)
Glucose-Capillary: 157 mg/dL — ABNORMAL HIGH (ref 70–99)
Glucose-Capillary: 109 mg/dL — ABNORMAL HIGH (ref 70–99)

## 2021-05-18 LAB — T4, FREE: Free T4: 0.65 ng/dL (ref 0.61–1.12)

## 2021-05-18 LAB — TSH: TSH: 36.944 u[IU]/mL — ABNORMAL HIGH (ref 0.350–4.500)

## 2021-05-18 LAB — CK: Total CK: 90 U/L (ref 38–234)

## 2021-05-18 MED ORDER — ACETAMINOPHEN 650 MG RE SUPP: 650.0000 mg | Freq: Four times a day (QID) | RECTAL | Status: DC | PRN

## 2021-05-18 MED ORDER — POLYETHYLENE GLYCOL 3350 17 G PO PACK
17.0000 g | PACK | Freq: Every day | ORAL | Status: DC
  Administered 2021-05-18 – 2021-05-21 (×4): 17 g via ORAL
  Filled 2021-05-18 (×4): qty 1

## 2021-05-18 MED ORDER — TRAMADOL HCL 50 MG PO TABS
50.0000 mg | ORAL_TABLET | Freq: Four times a day (QID) | ORAL | Status: DC | PRN
Start: 2021-05-17 — End: 2021-05-21
  Administered 2021-05-20 (×2): 50 mg via ORAL
  Filled 2021-05-18 (×2): qty 1

## 2021-05-18 MED ORDER — LOSARTAN POTASSIUM 50 MG PO TABS
100.0000 mg | ORAL_TABLET | Freq: Every day | ORAL | Status: DC
  Administered 2021-05-18 – 2021-05-21 (×4): 100 mg via ORAL
  Filled 2021-05-18 (×4): qty 2

## 2021-05-18 MED ORDER — CLONIDINE HCL 0.1 MG PO TABS
0.2000 mg | ORAL_TABLET | Freq: Two times a day (BID) | ORAL | Status: DC
  Administered 2021-05-18 – 2021-05-21 (×8): 0.2 mg via ORAL
  Filled 2021-05-18 (×8): qty 2

## 2021-05-18 MED ORDER — FLUOXETINE HCL 20 MG PO CAPS
20.0000 mg | ORAL_CAPSULE | Freq: Every day | ORAL | Status: DC
  Administered 2021-05-18 – 2021-05-21 (×4): 20 mg via ORAL
  Filled 2021-05-18 (×4): qty 1

## 2021-05-18 MED ORDER — INSULIN ASPART 100 UNIT/ML IJ SOLN
0.0000 [IU] | Freq: Three times a day (TID) | INTRAMUSCULAR | Status: DC
  Administered 2021-05-18 – 2021-05-20 (×3): 2 [IU] via SUBCUTANEOUS

## 2021-05-18 MED ORDER — PENTOSAN POLYSULFATE SODIUM 100 MG PO CAPS
200.0000 mg | ORAL_CAPSULE | Freq: Two times a day (BID) | ORAL | Status: DC
  Administered 2021-05-18 – 2021-05-21 (×7): 200 mg via ORAL
  Filled 2021-05-18 (×7): qty 2

## 2021-05-18 MED ORDER — POLYETHYLENE GLYCOL 3350 17 G PO PACK: 17.0000 g | PACK | Freq: Every day | ORAL | Status: DC | PRN

## 2021-05-18 MED ORDER — INSULIN GLARGINE 100 UNIT/ML ~~LOC~~ SOLN
30.0000 [IU] | Freq: Every day | SUBCUTANEOUS | Status: DC
  Administered 2021-05-18 – 2021-05-21 (×4): 30 [IU] via SUBCUTANEOUS
  Filled 2021-05-18 (×4): qty 0.3

## 2021-05-18 MED ORDER — ATORVASTATIN CALCIUM 40 MG PO TABS
40.0000 mg | ORAL_TABLET | Freq: Every day | ORAL | Status: DC
  Administered 2021-05-18 – 2021-05-20 (×3): 40 mg via ORAL
  Filled 2021-05-18 (×3): qty 1

## 2021-05-18 MED ORDER — LOSARTAN POTASSIUM-HCTZ 100-25 MG PO TABS: 1.0000 | ORAL_TABLET | Freq: Every day | ORAL | Status: DC

## 2021-05-18 MED ORDER — HYDROCHLOROTHIAZIDE 25 MG PO TABS
25.0000 mg | ORAL_TABLET | Freq: Every day | ORAL | Status: DC
  Administered 2021-05-18 – 2021-05-21 (×4): 25 mg via ORAL
  Filled 2021-05-18 (×4): qty 1

## 2021-05-18 MED ORDER — LEVOTHYROXINE SODIUM 50 MCG PO TABS
50.0000 ug | ORAL_TABLET | Freq: Every day | ORAL | Status: DC
  Administered 2021-05-19 – 2021-05-21 (×3): 50 ug via ORAL
  Filled 2021-05-18 (×3): qty 1

## 2021-05-18 MED ORDER — PANTOPRAZOLE SODIUM 40 MG PO TBEC
40.0000 mg | DELAYED_RELEASE_TABLET | Freq: Every day | ORAL | Status: DC
  Administered 2021-05-18 – 2021-05-21 (×4): 40 mg via ORAL
  Filled 2021-05-18 (×4): qty 1

## 2021-05-18 MED ORDER — ISOSORBIDE MONONITRATE ER 60 MG PO TB24
60.0000 mg | ORAL_TABLET | Freq: Every day | ORAL | Status: DC
  Administered 2021-05-18 – 2021-05-21 (×4): 60 mg via ORAL
  Filled 2021-05-18 (×4): qty 1

## 2021-05-18 MED ORDER — BACLOFEN 10 MG PO TABS: 5.0000 mg | ORAL_TABLET | Freq: Three times a day (TID) | ORAL | Status: DC | PRN

## 2021-05-18 MED ORDER — ACETAMINOPHEN 325 MG PO TABS
650.0000 mg | ORAL_TABLET | Freq: Four times a day (QID) | ORAL | Status: DC | PRN
  Administered 2021-05-18 – 2021-05-19 (×3): 650 mg via ORAL
  Filled 2021-05-18 (×3): qty 2

## 2021-05-18 MED ORDER — MEGESTROL ACETATE 40 MG PO TABS
40.0000 mg | ORAL_TABLET | Freq: Every day | ORAL | Status: DC
  Administered 2021-05-18 – 2021-05-19 (×2): 40 mg via ORAL
  Filled 2021-05-18 (×2): qty 1

## 2021-05-18 MED ORDER — LORATADINE 10 MG PO TABS
10.0000 mg | ORAL_TABLET | Freq: Every day | ORAL | Status: DC
  Administered 2021-05-18 – 2021-05-21 (×4): 10 mg via ORAL
  Filled 2021-05-18 (×4): qty 1

## 2021-05-18 MED ORDER — METOPROLOL SUCCINATE ER 50 MG PO TB24
50.0000 mg | ORAL_TABLET | Freq: Every day | ORAL | Status: DC
  Administered 2021-05-18 – 2021-05-21 (×4): 50 mg via ORAL
  Filled 2021-05-18 (×4): qty 1

## 2021-05-18 MED ORDER — NITROGLYCERIN 0.4 MG SL SUBL: 0.4000 mg | SUBLINGUAL_TABLET | SUBLINGUAL | Status: DC | PRN

## 2021-05-18 MED ORDER — AMLODIPINE BESYLATE 10 MG PO TABS
10.0000 mg | ORAL_TABLET | Freq: Every day | ORAL | Status: DC
  Administered 2021-05-18 – 2021-05-21 (×4): 10 mg via ORAL
  Filled 2021-05-18 (×4): qty 1

## 2021-05-18 MED ORDER — LINACLOTIDE 72 MCG PO CAPS
72.0000 ug | ORAL_CAPSULE | Freq: Every day | ORAL | Status: DC
  Administered 2021-05-19: 72 ug via ORAL
  Filled 2021-05-18 (×3): qty 1

## 2021-05-18 MED ORDER — ENOXAPARIN SODIUM 40 MG/0.4ML IJ SOSY
40.0000 mg | PREFILLED_SYRINGE | INTRAMUSCULAR | Status: DC
  Administered 2021-05-18 – 2021-05-21 (×4): 40 mg via SUBCUTANEOUS
  Filled 2021-05-18 (×4): qty 0.4

## 2021-05-18 NOTE — Evaluation (Signed)
Physical Therapy Evaluation Patient Details Name: [name] MRN: [id number] DOB: 05-Aug-1951 Today's Date: 05/18/2021   History of Present Illness  70 y.o. female was brought to the ER after patient was finding it increasingly difficult to ambulate because of weakness.  Patient also has been a right thigh pain and has not moved her bowels for almost 5 days.  Pt with history of diabetes mellitus type 2, hypertension, chronic kidney disease stage III, hypothyroidism who was admitted last month for right femur fracture status post ORIF on 04/16/21.  Clinical Impression  Pt admitted with above diagnosis. +2 total assist for supine to sit. Max assist for sitting balance at edge of bed 2* posterior lean of trunk. Pt reports she was walking with HHPT prior to this admission. She is agreeable to considering ST-SNF. Mechanical lift recommended for transfers.  Pt currently with functional limitations due to the deficits listed below (see PT Problem List). Pt will benefit from skilled PT to increase their independence and safety with mobility to allow discharge to the venue listed below.       Follow Up Recommendations SNF;Supervision/Assistance - 24 hour;Supervision for mobility/OOB    Equipment Recommendations  None recommended by PT    Recommendations for Other Services       Precautions / Restrictions Precautions Precautions: Fall Restrictions Weight Bearing Restrictions: No Other Position/Activity Restrictions: WBAT per prior PT notes      Mobility  Bed Mobility Overal bed mobility: Needs Assistance Bed Mobility: Supine to Sit;Sit to Supine     Supine to sit: Total assist;+2 for physical assistance Sit to supine: Total assist;+2 for physical assistance   General bed mobility comments: assist to advance BLEs and to raise trunk (pt 15%)    Transfers                 General transfer comment: unable, mechanical lift recommended  Ambulation/Gait                 Stairs            Wheelchair Mobility    Modified Rankin (Stroke Patients Only)       Balance Overall balance assessment: Needs assistance Sitting-balance support: Bilateral upper extremity supported;Feet supported Sitting balance-Leahy Scale: Zero Sitting balance - Comments: max A for sitting balance 2* posterior lean Postural control: Posterior lean                                   Pertinent Vitals/Pain Pain Assessment: No/denies pain    Home Living Family/patient expects to be discharged to:: Private residence Living Arrangements: Spouse/significant other;Children Available Help at Discharge: Family;Available 24 hours/day Type of Home: House Home Access: Stairs to enter Entrance Stairs-Rails: Psychiatric nurse of Steps: 3 Home Layout: One level Home Equipment: Walker - 2 wheels;Walker - 4 wheels;Bedside commode;Shower seat;Toilet riser;Hand held shower head;Other (comment);Wheelchair - manual (hoyer lift)      Prior Function Level of Independence: Needs assistance   Gait / Transfers Assistance Needed: pt reports she was walking with a RW with HHPT prior to this admission, she also stated her husband used hoyer lift to get her OOB but other times she could get OOB without the lift; will need to confirm this with family  ADL's / Homemaking Assistance Needed: Pt previously was independent with ADLs (timeframe with independence unknown). Pt's husband reports assisting with ADLs (i.e., feeding and bathing) after fall, as well as  baseline strength and coordination deficits in UEs  Comments: Information obtained from med chart as patient is a poor historian with Hx of cog deficits at baseline.     Hand Dominance   Dominant Hand: Right    Extremity/Trunk Assessment   Upper Extremity Assessment Upper Extremity Assessment: Generalized weakness    Lower Extremity Assessment Lower Extremity Assessment: RLE deficits/detail;LLE  deficits/detail RLE Deficits / Details: knee ext 2/5 (unclear if pt had difficulty understanding instructions or truly could not extend knee in sitting) LLE Deficits / Details: knee ext 2/5 (unclear if pt had difficulty understanding instructions or truly could not extend knee in sitting)    Cervical / Trunk Assessment Cervical / Trunk Assessment: Normal  Communication   Communication: No difficulties  Cognition Arousal/Alertness: Awake/alert Behavior During Therapy: WFL for tasks assessed/performed Overall Cognitive Status: No family/caregiver present to determine baseline cognitive functioning                                 General Comments: increased time to answer questions, some inconsistencies in her responses to questions      General Comments      Exercises     Assessment/Plan    PT Assessment Patient needs continued PT services  PT Problem List Decreased strength;Decreased mobility;Decreased activity tolerance;Decreased balance;Decreased cognition       PT Treatment Interventions Therapeutic activities;Therapeutic exercise;Functional mobility training;Balance training;Patient/family education    PT Goals (Current goals can be found in the Care Plan section)  Acute Rehab PT Goals Patient Stated Goal: to be more independent PT Goal Formulation: With patient Time For Goal Achievement: 06/01/21 Potential to Achieve Goals: Fair    Frequency Min 2X/week   Barriers to discharge        Co-evaluation               AM-PAC PT "6 Clicks" Mobility  Outcome Measure Help needed turning from your back to your side while in a flat bed without using bedrails?: Total Help needed moving from lying on your back to sitting on the side of a flat bed without using bedrails?: Total Help needed moving to and from a bed to a chair (including a wheelchair)?: Total Help needed standing up from a chair using your arms (e.g., wheelchair or bedside chair)?:  Total Help needed to walk in hospital room?: Total Help needed climbing 3-5 steps with a railing? : Total 6 Click Score: 6    End of Session Equipment Utilized During Treatment: Gait belt Activity Tolerance: Patient tolerated treatment well Patient left: in bed;with call bell/phone within reach;with bed alarm set Nurse Communication: Mobility status;Need for lift equipment PT Visit Diagnosis: Difficulty in walking, not elsewhere classified (R26.2);Other abnormalities of gait and mobility (R26.89);Muscle weakness (generalized) (M62.81)    Time: [id number] PT Time Calculation (min) (ACUTE ONLY): 23 min   Charges:   PT Evaluation $PT Eval Moderate Complexity: 1 Mod         Philomena Doheny PT 05/18/2021  Acute Rehabilitation Services Pager [phone] Office [phone]

## 2021-05-18 NOTE — Progress Notes (Addendum)
  PROGRESS NOTE  Patient admitted earlier this morning. See H&P.   [name] is a 70 y.o. female with history of diabetes mellitus type 2, hypertension, chronic kidney disease stage III, hypothyroidism who was admitted last month for right femur fracture status post ORIF was brought to the ER after patient was finding it increasingly difficult to ambulate because of weakness.  Patient also has been a right thigh pain and has not moved her bowels for almost 5 days.  CT abdomen pelvis shows constipation.  X-ray of the right femur does not show any acute findings.   On examination, patient remains a poor historian and is confused.  She is alert and oriented to self only.Tells me she is in a "brick building" and it's "2015" "July".  She tells me that she had a bowel movement yesterday, not particularly in significant amount of pain.  Per chart review, patient was in a lot of pain at home and husband had been administrating more tramadol and ran out of it. She also had not had a bowel movement in several days.  On physical examination, patient has 5/5 strength of bilateral UEs, has grip strength bilaterally although mildly weak. She is able to make a fist. I don't appreciate significant muscular atrophy. She has 4/5 strength of bilateral LEs.   Case was discussed with neurology over the phone who reviewed the patient's chart.  CK remains in normal range which rules out myopathy.  Patient's previous neurological work-up for altered mental status revealed possible start of dementia.  Her lower extremity weakness may also be complicated by [id number] degenerative spinal stenosis, L5-S1 mild disc narrowing and bulging, which is present on CT lumbar spine in July 2022. No further work-up recommended.    TSH is elevated but free T4 normal.   Patient needs PT OT, possibly SNF placement but husband may refuse Enema ordered for constipation Updated husband over the phone   Status is: Observation  The patient  will require care spanning > 2 midnights and should be moved to inpatient because: Unsafe d/c plan  Dispo: The patient is from: Home              Anticipated d/c is to: Home              Patient currently is not medically stable to d/c.   Difficult to place patient No       Dessa Phi, DO [facility] 05/18/2021, 12:40 PM  Available via Epic secure chat 7am-7pm After these hours, please refer to coverage provider listed on [email or web address]

## 2021-05-18 NOTE — TOC Initial Note (Signed)
Transition of Care (TOC) - Initial/Assessment Note    Patient Details  Name: [name] MRN: [id number] Date of Birth: 03/30/1951  Transition of Care (TOC) CM/SW Contact:    , , LCSW Phone Number: 05/18/2021, 3:48 PM  Clinical Narrative:                 Met with pt, spouse and daughter this morning per PT recommendation for SNF.  Per chart review, this has been recommendations with prior hospitalizations, however, spouse has refused.  Pt does not engage really in the conversation.  Spouse is aware of recommendation and asks "so what'cha got"?  Attempted to confirm he is agreed with the plan and he states he is agreeable "as long as it's 3 1/2 stars or higher.  I'm not putting her in some hole for her to die."  Explained that I would start SNF bed search and would review offers once I have them.   Will follow up with spouse again tomorrow.  Expected Discharge Plan: Skilled Nursing Facility (if agreeable) Barriers to Discharge: Continued Medical Work up   Patient Goals and CMS Choice        Expected Discharge Plan and Services Expected Discharge Plan: Skilled Nursing Facility (if agreeable) In-house Referral: Clinical Social Work     Living arrangements for the past 2 months: Single Family Home                                      Prior Living Arrangements/Services Living arrangements for the past 2 months: Single Family Home Lives with:: Spouse Patient language and need for interpreter reviewed:: Yes        Need for Family Participation in Patient Care: Yes (Comment) Care giver support system in place?: Yes (comment)   Criminal Activity/Legal Involvement Pertinent to Current Situation/Hospitalization: No - Comment as needed  Activities of Daily Living Home Assistive Devices/Equipment: CBG Meter, Blood pressure cuff, Walker (specify type), Wheelchair, Cane (specify quad or straight) ADL Screening (condition at time of admission) Patient's cognitive  ability adequate to safely complete daily activities?: No Is the patient deaf or have difficulty hearing?: No Does the patient have difficulty seeing, even when wearing glasses/contacts?: No Does the patient have difficulty concentrating, remembering, or making decisions?: Yes Patient able to express need for assistance with ADLs?: Yes Does the patient have difficulty dressing or bathing?: Yes Independently performs ADLs?: No Communication: Independent Dressing (OT): Dependent Is this a change from baseline?: Pre-admission baseline Grooming: Dependent Is this a change from baseline?: Pre-admission baseline Feeding: Dependent Is this a change from baseline?: Pre-admission baseline Bathing: Dependent Is this a change from baseline?: Pre-admission baseline Toileting: Dependent Is this a change from baseline?: Pre-admission baseline In/Out Bed: Dependent Is this a change from baseline?: Pre-admission baseline Walks in Home: Dependent Is this a change from baseline?: Pre-admission baseline Does the patient have difficulty walking or climbing stairs?: Yes Weakness of Legs: Both Weakness of Arms/Hands: Both  Permission Sought/Granted Permission sought to share information with : Family Supports Permission granted to share information with : Yes, Verbal Permission Granted  Share Information with NAME: [name] Raupp     Permission granted to share info w Relationship: spouse  Permission granted to share info w Contact Information: [phone]  Emotional Assessment Appearance:: Appears stated age   Affect (typically observed): Quiet Orientation: : Oriented to Self Alcohol / Substance Use: Not Applicable Psych Involvement: No (comment)    Admission diagnosis:  Low bicarbonate level [E87.8] Failure to thrive in adult [R62.7] Generalized weakness [R53.1] Constipation, unspecified constipation type [K59.00] Patient Active Problem List   Diagnosis Date Noted   Generalized weakness  05/17/2021   Femur fracture (HCC) 05/01/2021   Bilateral hand pain 05/01/2021   Weight loss 04/15/2021   Leukocytosis 04/15/2021   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   Acute diverticulitis 03/21/2021   Degenerative disc disease, cervical 02/08/2021   Type 2 diabetes mellitus (HCC) 02/02/2021   Acquired hypothyroidism 11/24/2020   Chronic pain 11/24/2020   Severe sepsis (HCC) 11/09/2020   Hyperlipidemia associated with type 2 diabetes mellitus (HCC) 11/09/2020   Acute metabolic encephalopathy 11/09/2020   Acute lower UTI 11/09/2020   Recurrent falls 10/19/2020   Gait disorder 10/11/2020   Fall 10/11/2020   Low grade fever 02/04/2020   Chest pain 01/26/2020   Lumbar post-laminectomy syndrome 10/05/2019   Spinal cord stimulator status 10/05/2019   Body mass index (BMI) 40.0-44.9, adult (HCC) 10/05/2019   Rectal bleeding 09/01/2019   History of peptic ulcer disease 09/01/2019   Screening for viral disease 09/01/2019   Insulin dependent type 2 diabetes mellitus (HCC) 09/01/2019   Pyelonephritis 06/05/2019   Recurrent UTI 06/05/2019   Urinary incontinence 06/05/2019   Nausea & vomiting 02/08/2019   Abdominal pain 02/08/2019   Constipation 02/08/2019   UTI (urinary tract infection) 02/08/2019   LUQ pain 01/21/2019   Cervical radiculopathy at C6 01/28/2018   Left lateral epicondylitis 01/23/2018   Obesity 01/23/2018   Weakness 05/01/2017   Hyperkalemia 05/01/2017   Acute kidney injury superimposed on CKD (HCC) 05/01/2017   Essential (primary) hypertension 05/01/2017   Sepsis (HCC) 05/01/2017   Dysuria 04/23/2017   Right leg swelling 12/27/2016   Leg pain, lateral, right 11/05/2016   Radiculopathy 04/03/2016   Hypersomnolence 11/23/2015   Right sided abdominal pain 07/25/2015   Malaise and fatigue 11/05/2014   Rotator cuff syndrome 07/26/2014   Fracture of fifth metacarpal bone of right hand 07/08/2014   Right cervical radiculopathy 06/27/2014   Chronic interstitial  cystitis 05/23/2014   Osteoarthritis of CMC joint of thumb 04/11/2014   De Quervain's tenosynovitis, left 03/14/2014   CKD (chronic kidney disease), stage III (HCC) 03/03/2014   Lower back pain 03/03/2014   Ingrown nail 03/03/2014   Peripheral edema 02/23/2014   Lateral epicondylitis of right elbow 01/31/2014   Neck pain on left side 11/19/2013   Abdominal discomfort 10/12/2012   Right lumbar radiculopathy 05/11/2012   Peripheral neuropathy 05/11/2012   Vertigo 03/29/2012   Headache(784.0) 03/10/2012   Left lower quadrant abdominal pain 10/24/2011   Lumbar radiculopathy 10/24/2011   Hypothyroidism 08/19/2011   Right leg weakness 04/04/2011   Hematochezia 01/07/2011   Preventative health care 01/07/2011   CHOLELITHIASIS 05/04/2009   GERD 04/26/2009   Gastroparesis 04/26/2009   ULCER-GASTRIC 03/15/2009   DIVERTICULOSIS-COLON 03/15/2009   Cervical radiculopathy 10/28/2008   MENOPAUSAL DISORDER 06/29/2008   Pain in joint, shoulder region 10/12/2007   INSOMNIA-SLEEP DISORDER-UNSPEC 09/01/2007   Depression 09/01/2007   Macrocytic anemia 08/13/2007   COMMON MIGRAINE 08/13/2007   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   Diabetes (HCC) 05/06/2007   HLD (hyperlipidemia) 05/06/2007   ANXIETY 05/06/2007   Hypertension associated with diabetes (HCC) 05/06/2007   Coronary atherosclerosis 05/06/2007   PCP:  John, James W, MD Pharmacy:   [id number] - [place], [place] - [address] AT [address] [place] [address] Phone: [phone] Fax:   [phone]  [id number] - [place] - [address] [place] [address] Phone: [phone] Fax: [phone]  [facility] - [place], [place] - [address] [place] [address] Phone: [phone] Fax: [phone]  [id number] - [place] - [address] [place] [address] Phone: [phone] Fax: [phone]  [facility] [address] [place] [address] Phone: [phone] Fax: [phone]     Social Determinants of Health (SDOH) Interventions    Readmission Risk Interventions Readmission Risk Prevention Plan 04/24/2021  Transportation Screening Complete  PCP or Specialist appointment within 3-5 days of discharge Complete  HRI or Home Care Consult Complete  SW Recovery Care/Counseling Consult Complete  Palliative Care Screening Not Applicable  Skilled Nursing Facility Not Applicable  Some recent data might be hidden     

## 2021-05-18 NOTE — Progress Notes (Signed)
Attempted to roll pt for ordered enema, pt very upset that her husband and kids are not in the room. Pt very anxious and does not want to be moved. Will pass on to attempt enema when her husband comes back in the morning so pt will be more calm.

## 2021-05-18 NOTE — [place] FL2 (Signed)
[place] MEDICAID FL2 LEVEL OF CARE SCREENING TOOL     IDENTIFICATION  Patient Name: [name] Birthdate: [birth year] Sex: female Admission Date (Current Location): 05/17/2021  [facility] and [place] Number:  Herbalist and Address:  [facility],  [address], [place]      Provider Number: [id number]  Attending Physician Name and Address:  Dessa Phi, DO  Relative Name and Phone Number:  spouse, Vernetta Roehrig @ [phone]    Current Level of Care: Hospital Recommended Level of Care: [place] Prior Approval Number:    Date Approved/Denied:   PASRR Number: [id number]  Discharge Plan: SNF    Current Diagnoses: Patient Active Problem List   Diagnosis Date Noted   Generalized weakness 05/17/2021   Femur fracture ([place]) 05/01/2021   Bilateral hand pain 05/01/2021   Weight loss 04/15/2021   Leukocytosis 04/15/2021   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   Acute diverticulitis 03/21/2021   Degenerative disc disease, cervical 02/08/2021   Type 2 diabetes mellitus ([place]) 02/02/2021   Acquired hypothyroidism 11/24/2020   Chronic pain 11/24/2020   Severe sepsis ([place]) 11/09/2020   Hyperlipidemia associated with type 2 diabetes mellitus ([place]) 99991111   Acute metabolic encephalopathy 99991111   Acute lower UTI 11/09/2020   Recurrent falls 10/19/2020   Gait disorder 10/11/2020   Fall 10/11/2020   Low grade fever 02/04/2020   Chest pain 01/26/2020   Lumbar post-laminectomy syndrome 10/05/2019   Spinal cord stimulator status 10/05/2019   Body mass index (BMI) 40.0-44.9, adult ([place]) 10/05/2019   Rectal bleeding 09/01/2019   History of peptic ulcer disease 09/01/2019   Screening for viral disease 09/01/2019   Insulin dependent type 2 diabetes mellitus ([place]) 09/01/2019   Pyelonephritis 06/05/2019   Recurrent UTI 06/05/2019   Urinary incontinence 06/05/2019   Nausea & vomiting 02/08/2019   Abdominal pain  02/08/2019   Constipation 02/08/2019   UTI (urinary tract infection) 02/08/2019   LUQ pain 01/21/2019   Cervical radiculopathy at C6 01/28/2018   Left lateral epicondylitis 01/23/2018   Obesity 01/23/2018   Weakness 05/01/2017   Hyperkalemia 05/01/2017   Acute kidney injury superimposed on CKD ([place]) 05/01/2017   Essential (primary) hypertension 05/01/2017   Sepsis ([place]) 05/01/2017   Dysuria 04/23/2017   Right leg swelling 12/27/2016   Leg pain, lateral, right 11/05/2016   Radiculopathy 04/03/2016   Hypersomnolence 11/23/2015   Right sided abdominal pain 07/25/2015   Malaise and fatigue 11/05/2014   Rotator cuff syndrome 07/26/2014   Fracture of fifth metacarpal bone of right hand 07/08/2014   Right cervical radiculopathy 06/27/2014   Chronic interstitial cystitis 05/23/2014   Osteoarthritis of CMC joint of thumb 04/11/2014   De Quervain's tenosynovitis, left 03/14/2014   CKD (chronic kidney disease), stage III ([place]) 03/03/2014   Lower back pain 03/03/2014   Ingrown nail 03/03/2014   Peripheral edema 02/23/2014   Lateral epicondylitis of right elbow 01/31/2014   Neck pain on left side 11/19/2013   Abdominal discomfort 10/12/2012   Right lumbar radiculopathy 05/11/2012   Peripheral neuropathy 05/11/2012   Vertigo 03/29/2012   Headache(784.0) 03/10/2012   Left lower quadrant abdominal pain 10/24/2011   Lumbar radiculopathy 10/24/2011   Hypothyroidism 08/19/2011   Right leg weakness 04/04/2011   Hematochezia 01/07/2011   Preventative health care 01/07/2011   CHOLELITHIASIS 05/04/2009   GERD 04/26/2009   Gastroparesis 04/26/2009   ULCER-GASTRIC 03/15/2009   DIVERTICULOSIS-COLON 03/15/2009   Cervical radiculopathy 10/28/2008   MENOPAUSAL DISORDER 06/29/2008   Pain in  joint, shoulder region 10/12/2007   INSOMNIA-SLEEP DISORDER-UNSPEC 09/01/2007   Depression 09/01/2007   Macrocytic anemia 08/13/2007   COMMON MIGRAINE 08/13/2007   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    Diabetes ([place]) 05/06/2007   HLD (hyperlipidemia) 05/06/2007   ANXIETY 05/06/2007   Hypertension associated with diabetes ([place]) 05/06/2007   Coronary atherosclerosis 05/06/2007    Orientation RESPIRATION BLADDER Height & Weight     Self  Normal Incontinent, External catheter Weight: 263 lb (119.3 kg) Height:  5' 5.5" (166.4 cm)  BEHAVIORAL SYMPTOMS/MOOD NEUROLOGICAL BOWEL NUTRITION STATUS      Incontinent    AMBULATORY STATUS COMMUNICATION OF NEEDS Skin   Total Care Verbally Normal                       Personal Care Assistance Level of Assistance  Bathing, Dressing Bathing Assistance: Limited assistance   Dressing Assistance: Limited assistance     Functional Limitations Info             SPECIAL CARE FACTORS FREQUENCY  PT (By licensed PT), OT (By licensed OT)     PT Frequency: 5x/wk OT Frequency: 5x/wk            Contractures      Additional Factors Info  Code Status, Allergies, Psychotropic Code Status Info: Full Allergies Info: see MAR Psychotropic Info: see MAR         Current Medications (05/18/2021):  This is the current hospital active medication list Current Facility-Administered Medications  Medication Dose Route Frequency Provider Last Rate Last Admin   acetaminophen (TYLENOL) tablet 650 mg  650 mg Oral Q6H PRN Rise Patience, MD   650 mg at 05/18/21 1154   Or   acetaminophen (TYLENOL) suppository 650 mg  650 mg Rectal Q6H PRN Rise Patience, MD       amLODipine (NORVASC) tablet 10 mg  10 mg Oral Daily Rise Patience, MD   10 mg at 05/18/21 1154   atorvastatin (LIPITOR) tablet 40 mg  40 mg Oral QHS Rise Patience, MD       cloNIDine (CATAPRES) tablet 0.2 mg  0.2 mg Oral BID Rise Patience, MD   0.2 mg at 05/18/21 1047   enoxaparin (LOVENOX) injection 40 mg  40 mg Subcutaneous Q24H Rise Patience, MD   40 mg at 05/18/21 1048   FLUoxetine (PROZAC) capsule 20 mg  20 mg Oral Daily Rise Patience, MD   20  mg at 05/18/21 1047   losartan (COZAAR) tablet 100 mg  100 mg Oral Daily Dessa Phi, DO   100 mg at 05/18/21 1408   And   hydrochlorothiazide (HYDRODIURIL) tablet 25 mg  25 mg Oral Daily Dessa Phi, DO   25 mg at 05/18/21 1408   insulin aspart (novoLOG) injection 0-9 Units  0-9 Units Subcutaneous TID WC Rise Patience, MD       insulin glargine (LANTUS) injection 30 Units  30 Units Subcutaneous Daily Rise Patience, MD   30 Units at 05/18/21 1155   isosorbide mononitrate (IMDUR) 24 hr tablet 60 mg  60 mg Oral Daily Rise Patience, MD   60 mg at 05/18/21 1048   levothyroxine (SYNTHROID) tablet 50 mcg  50 mcg Oral Q0600 Rise Patience, MD       [START ON 05/19/2021] linaclotide (LINZESS) capsule 72 mcg  72 mcg Oral QAC breakfast Rise Patience, MD       loratadine (CLARITIN) tablet 10 mg  10 mg Oral Daily Rise Patience, MD   10 mg at 05/18/21 1154   megestrol (MEGACE) tablet 40 mg  40 mg Oral Daily Rise Patience, MD   40 mg at 05/18/21 1153   metoprolol succinate (TOPROL-XL) 24 hr tablet 50 mg  50 mg Oral Daily Rise Patience, MD   50 mg at 05/18/21 1154   nitroGLYCERIN (NITROSTAT) SL tablet 0.4 mg  0.4 mg Sublingual Q5 min PRN Rise Patience, MD       pantoprazole (PROTONIX) EC tablet 40 mg  40 mg Oral Daily Rise Patience, MD   40 mg at 05/18/21 1154   pentosan polysulfate (ELMIRON) capsule 200 mg  200 mg Oral BID Rise Patience, MD   200 mg at 05/18/21 1154   polyethylene glycol (MIRALAX / GLYCOLAX) packet 17 g  17 g Oral Daily Dessa Phi, DO   17 g at 05/18/21 1409   traMADol (ULTRAM) tablet 50 mg  50 mg Oral Q6H PRN Rise Patience, MD         Discharge Medications: Please see discharge summary for a list of discharge medications.  Relevant Imaging Results:  Relevant Lab Results:   Additional Information SSN: [id number].  Pt is vaccinated for covid x 3  Iyanla Eilers, LCSW

## 2021-05-18 NOTE — H&P (Signed)
History and Physical    [name] [id number] DOB: 12-12-1950 DOA: 05/17/2021  PCP: Biagio Borg, MD  Patient coming from: Home.  Chief Complaint: Weakness.  HPI: [name] is a 70 y.o. female with history of diabetes mellitus type 2, hypertension, chronic kidney disease stage III, hypothyroidism who was admitted last month for right femur fracture status post ORIF was brought to the ER after patient was finding it increasingly difficult to ambulate because of weakness.  Patient also has been a right thigh pain and has not moved her bowels for almost 5 days.  Denies vomiting chest pain or shortness of breath.  Patient has been has noticed that patient's upper extremities are getting more weaker and patient is unable to make a fist from the upper extremities for the last few weeks since discharge.  At times muscle spasms.  Patient had been prescribed baclofen for the muscle spasm by the primary care physician.  ED Course: In the ER patient labs are mostly at baseline with macrocytic anemic picture B12 and folate levels are normal during last admission last month.  CT abdomen pelvis shows constipation.  X-ray of the right femur does not show any acute findings.  On my exam patient has weakness of the upper extremities with muscles of the hands looking atrophic and patient is not able to make a good fist of both upper extremities.  Patient has poor strength on the extremities.  Patient's right lower extremity movement is decreased because of the pain.  COVID test is negative.  Review of Systems: As per HPI, rest all negative.   Past Medical History:  Diagnosis Date   Anxiety    Arthritis    Blood dyscrasia    "FREE BLEEDER"   CERVICAL RADICULOPATHY, LEFT    Chronic back pain    CKD (chronic kidney disease)    DR. [facility]   COMMON MIGRAINE    CORONARY ARTERY DISEASE    Cough    CURRENT COLD   Cystitis    DEPRESSION    Diabetes mellitus, type II (HCC)     DIVERTICULOSIS-COLON    Dysrhythmia    palpitations   Gastric ulcer 04/2008   Gastroparesis    GERD (gastroesophageal reflux disease)    Hiatal hernia    Hyperlipidemia    Hypertension    Hypothyroidism    INSOMNIA-SLEEP DISORDER-UNSPEC    Iron deficiency anemia    Wears glasses     Past Surgical History:  Procedure Laterality Date   ABDOMINAL HYSTERECTOMY     ANTERIOR LAT LUMBAR FUSION Left 08/13/2017   Procedure: LEFT SIDED LUMBAR 3-4 LATERAL INTERBODY FUSION WITH INSTRUMENTATION AND ALLOGRAFT;  Surgeon: Phylliss Bob, MD;  Location: [facility];  Service: Orthopedics;  Laterality: Left;  LEFT SIDED LUMBAR 3-4 LATERAL INTERBODY FUSION WITH INSTRUMENTATION AND ALLOGRAFT; REQUEST 3 HOURS   Back Stimulator  07/2018   BACK SURGERY  03/2016   BREAST EXCISIONAL BIOPSY Right    40 years ago   CHOLECYSTECTOMY  06/2009   COLONOSCOPY     ESOPHAGOGASTRODUODENOSCOPY     EYE SURGERY Bilateral    lasik   Gastric Wedge resection lipoma  11/2007   x2 with laparotomy and gastrostomy   JOINT REPLACEMENT     Left knee replacement     ORIF FEMUR FRACTURE Right 04/16/2021   Procedure: OPEN REDUCTION INTERNAL FIXATION (ORIF) DISTAL FEMUR FRACTURE;  Surgeon: Shona Needles, MD;  Location: [facility];  Service: Orthopedics;  Laterality:  Right;   Rigth knee replacement with revision  04/2008   Dr. Berenice Primas   ROTATOR CUFF REPAIR Left 01/2009   s/p bladder surgury  09/2009   Dr. Terance Hart   SPINAL CORD STIMULATOR INSERTION N/A 09/11/2018   Procedure: LUMBAR SPINAL CORD STIMULATOR INSERTION;  Surgeon: Clydell Hakim, MD;  Location: [facility];  Service: Neurosurgery;  Laterality: N/A;  LUMBAR SPINAL CORD STIMULATOR INSERTION     reports that she has quit smoking. She has never used smokeless tobacco. She reports that she does not drink alcohol and does not use drugs.  Allergies  Allergen Reactions   Ciprofloxacin Shortness Of Breath and Other (See Comments)    Dizziness    Hydrocodone Shortness Of Breath and Other  (See Comments)    Tachycardia   Hydrocortisone    Penicillins Hives    Pt just remembers hives, SHE HAS HAD AUGEMTIN WITHOUT ISSUES PER PATIENT no SOB or lightheadedness with hypotension: No Has patient had a PCN reaction causing severe rash involving mucus membranes or skin necrosis: No Has patient had a PCN reaction that required hospitalization No Has patient had a PCN reaction occurring within the last 10 years: No If all of the above answers are "NO", then may proceed with Cephalosporin use.     Family History  Problem Relation Age of Onset   Diabetes Sister        x 3   Heart disease Sister        x2   Breast cancer Sister 7   Diabetes Brother        x3   Heart disease Brother        x2   Coronary artery disease Other        female 1st degree   Hypertension Other    Breast cancer Sister 25   Breast cancer Other 25   Diabetes Brother    Colon cancer Neg Hx     Prior to Admission medications   Medication Sig Start Date End Date Taking? Authorizing Provider  amLODipine (NORVASC) 10 MG tablet Take 1 tablet (10 mg total) by mouth daily. 04/25/21   Shelly Coss, MD  aspirin 81 MG EC tablet Take 1 tablet (81 mg total) by mouth 2 (two) times daily. After one month,continue taking once a day 04/24/21   Shelly Coss, MD  atorvastatin (LIPITOR) 40 MG tablet TAKE ONE TABLET BY MOUTH EVERY NIGHT AT BEDTIME 06/13/20   Biagio Borg, MD  baclofen (LIORESAL) 10 MG tablet Take 1 tablet (10 mg total) by mouth 3 (three) times daily. 05/01/21   Biagio Borg, MD  cloNIDine (CATAPRES) 0.2 MG tablet Take 1 tablet (0.2 mg total) by mouth 2 (two) times daily. 04/24/21   Shelly Coss, MD  Continuous Blood Gluc Receiver (FREESTYLE LIBRE 2 READER) DEVI Use as directed twice daily E11.9 10/11/20   Biagio Borg, MD  Continuous Blood Gluc Sensor (FREESTYLE LIBRE 2 SENSOR) MISC Use as directed once bi-weekly E11.9 10/11/20   Biagio Borg, MD  Dulaglutide (TRULICITY) 1.5 0000000 SOPN Inject 1.5 mg  into the skin every Monday. 02/02/21   [provider]  ELMIRON 100 MG capsule Take 200 mg by mouth 2 (two) times daily.  07/20/13   [provider]  fexofenadine (ALLEGRA) 180 MG tablet Take 1 tablet (180 mg total) by mouth daily. 05/01/21 05/01/22  Biagio Borg, MD  FLUoxetine (PROZAC) 20 MG capsule TAKE ONE CAPSULE BY MOUTH DAILY 11/13/20   Biagio Borg, MD  glucose blood (FREESTYLE TEST STRIPS) test strip Use as instructed to check blood sugar 4 times daily. 04/20/18   Elayne Snare, MD  glucose blood test strip Use as instructed 12/21/20   Elayne Snare, MD  Insulin [facility]) 100 UNIT/ML Inject 30 Units into the skin daily. 02/02/21   [provider]  insulin lispro (HUMALOG) 100 UNIT/ML injection Inject 3-20 Units into the skin See admin instructions. Per sliding scale 3 times daily    [provider]  isosorbide mononitrate (IMDUR) 60 MG 24 hr tablet Take 1 tablet (60 mg total) by mouth daily. 02/21/20   Biagio Borg, MD  levothyroxine (SYNTHROID) 50 MCG tablet Take 1 tablet (50 mcg total) by mouth daily at 6 (six) AM. 04/25/21   Shelly Coss, MD  linaclotide [facility]) 72 MCG capsule Take 1 capsule (72 mcg total) by mouth daily before breakfast. 11/13/20   Biagio Borg, MD  Loteprednol Etabonate (LOTEMAX SM) 0.38 % GEL Place 1 drop into both eyes 3 (three) times daily. 11/29/20   [provider]  megestrol (MEGACE) 40 MG tablet Take 1 tablet (40 mg total) by mouth daily. 05/03/21   Biagio Borg, MD  metoprolol succinate (TOPROL-XL) 50 MG 24 hr tablet TAKE 1 TABLET BY MOUTH DAILY WITH OR IMMEDIATELY FOLLOWING A MEAL 03/29/21   Biagio Borg, MD  nitroGLYCERIN (NITROSTAT) 0.4 MG SL tablet Place 1 tablet (0.4 mg total) under the tongue every 5 (five) minutes as needed for chest pain. 01/27/20   Ghimire, Henreitta Leber, MD  ondansetron (ZOFRAN ODT) 4 MG disintegrating tablet Take 1 tablet (4 mg total) by mouth every 8 (eight) hours as needed for up to  15 doses for nausea or vomiting. 03/21/21   [name], MD  pantoprazole (PROTONIX) 40 MG tablet TAKE 1 TABLET(40 MG) BY MOUTH TWICE DAILY 11/13/20   Biagio Borg, MD  polyethylene glycol (MIRALAX / GLYCOLAX) 17 g packet Take 17 g by mouth daily as needed. 01/27/20   Ghimire, Henreitta Leber, MD  traMADol (ULTRAM) 50 MG tablet Take 1 tablet (50 mg total) by mouth every 6 (six) hours as needed for moderate pain or severe pain. 05/01/21   Biagio Borg, MD  enoxaparin (LOVENOX) 40 MG/0.4ML injection Inject 0.4 mLs (40 mg total) into the skin daily. 04/18/21 04/24/21  Delray Alt, PA-C    Physical Exam: Constitutional: Moderately built and nourished. Vitals:   05/17/21 2100 05/17/21 2114 05/17/21 2130 05/17/21 2238  BP: (!) 176/97  (!) 165/103 (!) 144/77  Pulse: (!) 103  (!) 102 (!) 102  Resp:    16  Temp:  99.1 F (37.3 C)  98.8 F (37.1 C)  TempSrc:    Oral  SpO2: 100%  99% 100%  Weight:      Height:       Eyes: Anicteric no pallor. ENMT: No discharge from the ears eyes nose and mouth. Neck: No mass felt.  No neck rigidity. Respiratory: No rhonchi or crepitations. Cardiovascular: S1-S2 heard. Abdomen: Soft nontender bowel sound present. Musculoskeletal: Pain on moving right lower extremity.  Has atrophic muscles of the hands.  Unable to make a fist. Skin: No rash. Neurologic: Alert awake oriented to time place and person.  Is full strength in the upper extremities.  Unable to make fist of both upper extremities.  Upper extremity.  The hand muscle looks atrophic.  No definite fibrillation seen.  Lower extremity movements are decreased on the right side because of the  pain. Psychiatric: Appears normal.  Normal affect.   Labs on Admission: I have personally reviewed following labs and imaging studies  CBC: Recent Labs  Lab 05/17/21 1653 05/17/21 1739  WBC 7.0  --   NEUTROABS 4.2  --   HGB 10.2* 10.2*  HCT 31.2* 30.0*  MCV 104.0*  --   PLT 270  --    Basic Metabolic  Panel: Recent Labs  Lab 05/17/21 1653 05/17/21 1739  NA 137 140  K 3.5 3.5  CL 113*  --   CO2 14*  --   GLUCOSE 159*  --   BUN 12  --   CREATININE 1.10*  --   CALCIUM 9.4  --    GFR: Estimated Creatinine Clearance: 61.2 mL/min (A) (by C-G formula based on SCr of 1.1 mg/dL (H)). Liver Function Tests: Recent Labs  Lab 05/17/21 1653  AST 18  ALT 13  ALKPHOS 76  BILITOT 0.6  PROT 6.7  ALBUMIN 3.3*   Recent Labs  Lab 05/17/21 1653  LIPASE 31   No results for input(s): AMMONIA in the last 168 hours. Coagulation Profile: No results for input(s): INR, PROTIME in the last 168 hours. Cardiac Enzymes: No results for input(s): CKTOTAL, CKMB, CKMBINDEX, TROPONINI in the last 168 hours. BNP (last 3 results) No results for input(s): PROBNP in the last 8760 hours. HbA1C: No results for input(s): HGBA1C in the last 72 hours. CBG: No results for input(s): GLUCAP in the last 168 hours. Lipid Profile: No results for input(s): CHOL, HDL, LDLCALC, TRIG, CHOLHDL, LDLDIRECT in the last 72 hours. Thyroid Function Tests: No results for input(s): TSH, T4TOTAL, FREET4, T3FREE, THYROIDAB in the last 72 hours. Anemia Panel: No results for input(s): VITAMINB12, FOLATE, FERRITIN, TIBC, IRON, RETICCTPCT in the last 72 hours. Urine analysis:    Component Value Date/Time   COLORURINE YELLOW 05/17/2021 2000   APPEARANCEUR CLOUDY (A) 05/17/2021 2000   LABSPEC 1.010 05/17/2021 2000   PHURINE 6.0 05/17/2021 2000   GLUCOSEU NEGATIVE 05/17/2021 2000   GLUCOSEU NEGATIVE 01/09/2021 1514   HGBUR NEGATIVE 05/17/2021 2000   BILIRUBINUR NEGATIVE 05/17/2021 2000   BILIRUBINUR negative 04/11/2018 1308   Mandan 05/17/2021 2000   PROTEINUR NEGATIVE 05/17/2021 2000   UROBILINOGEN 1.0 01/09/2021 1514   NITRITE NEGATIVE 05/17/2021 2000   LEUKOCYTESUR SMALL (A) 05/17/2021 2000   Sepsis Labs: '@LABRCNTIP'$ (procalcitonin:4,lacticidven:4) ) Recent Results (from the past 240 hour(s))  Resp Panel  by RT-PCR (Flu A&B, Covid) Nasopharyngeal Swab     Status: None   Collection Time: 05/17/21  6:37 PM   Specimen: Nasopharyngeal Swab; Nasopharyngeal(NP) swabs in vial transport medium  Result Value Ref Range Status   SARS Coronavirus 2 by RT PCR NEGATIVE NEGATIVE Final    Comment: (NOTE) SARS-CoV-2 target nucleic acids are NOT DETECTED.  The SARS-CoV-2 RNA is generally detectable in upper respiratory specimens during the acute phase of infection. The lowest concentration of SARS-CoV-2 viral copies this assay can detect is 138 copies/mL. A negative result does not preclude SARS-Cov-2 infection and should not be used as the sole basis for treatment or other patient management decisions. A negative result may occur with  improper specimen collection/handling, submission of specimen other than nasopharyngeal swab, presence of viral mutation(s) within the areas targeted by this assay, and inadequate number of viral copies(<138 copies/mL). A negative result must be combined with clinical observations, patient history, and epidemiological information. The expected result is Negative.  Fact Sheet for Patients:  EntrepreneurPulse.com.au  Fact Sheet for Healthcare Providers:  IncredibleEmployment.be  This test is no t yet approved or cleared by the [place] and  has been authorized for detection and/or diagnosis of SARS-CoV-2 by FDA under an Emergency Use Authorization (EUA). This EUA will remain  in effect (meaning this test can be used) for the duration of the COVID-19 declaration under Section 564(b)(1) of the Act, 21 U.S.C.section 360bbb-3(b)(1), unless the authorization is terminated  or revoked sooner.       Influenza A by PCR NEGATIVE NEGATIVE Final   Influenza B by PCR NEGATIVE NEGATIVE Final    Comment: (NOTE) The Xpert Xpress SARS-CoV-2/FLU/RSV plus assay is intended as an aid in the diagnosis of influenza from Nasopharyngeal swab  specimens and should not be used as a sole basis for treatment. Nasal washings and aspirates are unacceptable for Xpert Xpress SARS-CoV-2/FLU/RSV testing.  Fact Sheet for Patients: EntrepreneurPulse.com.au  Fact Sheet for Healthcare Providers: IncredibleEmployment.be  This test is not yet approved or cleared by the [place] FDA and has been authorized for detection and/or diagnosis of SARS-CoV-2 by FDA under an Emergency Use Authorization (EUA). This EUA will remain in effect (meaning this test can be used) for the duration of the COVID-19 declaration under Section 564(b)(1) of the Act, 21 U.S.C. section 360bbb-3(b)(1), unless the authorization is terminated or revoked.  Performed at [facility], [address] [place], [place] [address]      Radiological Exams on Admission: CT ABDOMEN PELVIS WO CONTRAST  Result Date: 05/17/2021 CLINICAL DATA:  Right knee pain and constipation. Previous orthopedic surgery right femur. EXAM: CT ABDOMEN AND PELVIS WITHOUT CONTRAST TECHNIQUE: Multidetector CT imaging of the abdomen and pelvis was performed following the standard protocol without IV contrast. COMPARISON:  04/15/2021 FINDINGS: Lower chest: Lung bases are clear.  Spinal neurostimulator in place. Hepatobiliary: Liver parenchyma appears normal without contrast. Previous cholecystectomy. Pancreas: Normal Spleen: Normal Adrenals/Urinary Tract: Adrenal glands are normal. Kidneys are normal without contrast. Bladder is normal. Stomach/Bowel: Previous gastric surgery. No acute finding. Small bowel pattern is normal. Moderate amount of fecal matter in the colon that could go along with constipation, with a larger amount in the rectosigmoid region. No sign of diverticulosis or diverticulitis. Vascular/Lymphatic: Aortic atherosclerosis. No aneurysm. IVC is normal. No adenopathy. Reproductive: Previous hysterectomy.  No pelvic mass. Other: No free fluid  or air. Left lateral abdominal wall hernia and left inguinal hernia containing fat as seen previously. Musculoskeletal: No acute bone finding. Previous lumbar region discectomy and fusion. IMPRESSION: Patient does have a large amount of fecal matter throughout the colon, particularly in the rectosigmoid region, which could go along with the clinical diagnosis of constipation. No acute finding otherwise. Electronically Signed   By: Nelson Chimes M.D.   On: 05/17/2021 17:50   DG Femur Min 2 Views Right  Result Date: 05/17/2021 CLINICAL DATA:  Right knee pain surgery EXAM: RIGHT FEMUR 2 VIEWS COMPARISON:  04/16/2021 FINDINGS: Previous ORIF with lateral femoral plate and screws. Previous knee replacement. Fracture line of the distal femur remains visible but is not changed in position or alignment. No unexpected or acute finding. IMPRESSION: No change or unexpected finding. Healing fracture of the distal femur. Electronically Signed   By: Nelson Chimes M.D.   On: 05/17/2021 17:47      Assessment/Plan Principal Problem:   Generalized weakness Active Problems:   Macrocytic anemia   Hypertension associated with diabetes ([place])   Hypothyroidism   CKD (chronic kidney disease), stage III (HCC)    Generalized weakness  particularly patient is weak on the upper extremities with hand muscles appearing atrophic.  No definite fibrillation seen in the muscle.  But primary care patient has noticed some spasms and was given baclofen.  Will consult neurology for further recommendation.  Physical therapy consult. Constipation for which I have ordered soapsuds enema. Abnormal UA but the patient does not have any symptoms.  Patient does take medicines for interstitial cystitis.  Will check urine cultures. Hypertension uncontrolled we will continue home medications including clonidine beta-blockers Imdur and amlodipine.  As needed IV hydralazine for systolic more than 0000000. Macrocytic anemia has had normal B12 and folate  last month.  We will recheck it. Hypothyroidism has briefly stopped Synthroid last month because of elevated free T4 levels.  Was restarted again.  We will check TSH again given the symptoms. Chronic and disease stage III creatinine appears to be at baseline. Diabetes mellitus type 2 on Basaglar insulin in the morning with sliding scale coverage. Recent right femur fracture status post ORIF last month.  X-rays do not show anything acute.   DVT prophylaxis: Lovenox. Code Status: Full code. Family Communication: Patient's husband. Disposition Plan: May need rehab. Consults called: We will consult neurology. Admission status: Observation.   Rise Patience MD [facility] Pager [phone].  If 7PM-7AM, please contact night-coverage [email or web address] Password [email or web address]  05/18/2021, 12:01 AM

## 2021-05-19 LAB — GLUCOSE, CAPILLARY
Glucose-Capillary: 102 mg/dL — ABNORMAL HIGH (ref 70–99)
Glucose-Capillary: 107 mg/dL — ABNORMAL HIGH (ref 70–99)
Glucose-Capillary: 110 mg/dL — ABNORMAL HIGH (ref 70–99)

## 2021-05-19 MED ORDER — MEGESTROL ACETATE 400 MG/10ML PO SUSP
400.0000 mg | Freq: Every day | ORAL | Status: DC
  Administered 2021-05-20 – 2021-05-21 (×2): 400 mg via ORAL
  Filled 2021-05-19 (×2): qty 10

## 2021-05-19 MED ORDER — GLUCERNA SHAKE PO LIQD
237.0000 mL | Freq: Three times a day (TID) | ORAL | Status: DC
  Administered 2021-05-20 – 2021-05-21 (×4): 237 mL via ORAL
  Filled 2021-05-19 (×7): qty 237

## 2021-05-19 MED ORDER — MEGESTROL ACETATE 40 MG PO TABS: 400.0000 mg | ORAL_TABLET | Freq: Every day | ORAL | Status: DC

## 2021-05-19 NOTE — Evaluation (Signed)
Occupational Therapy Evaluation Patient Details Name: [name] MRN: [id number] DOB: [birth year] Today's Date: 05/19/2021    History of Present Illness 70 y.o. female was brought to the ER after patient was finding it increasingly difficult to ambulate because of weakness.  Patient also has been a right thigh pain and has not moved her bowels for almost 5 days.  Pt with history of diabetes mellitus type 2, hypertension, chronic kidney disease stage III, hypothyroidism who was admitted last month for right femur fracture status post ORIF on 04/16/21. Pt is active with [facility] PT and OT.   Clinical Impression   Patient is currently requiring assistance with ADLs including Total assist with toileting, with LE dressing, and with with bathing at bed level, and maximum assist with bed level feeding grooming and UE dressing, all of which is below patient's typical baseline of being Modified independent with use of cane to ambulate PRIOR TO PT'S FALL AND FEMUR FRACTURE WITH ORIF ON April 16, 2021.  Since returning home in July pt has required Total care with hoyer to wheelchair and assistance with all ADLs including feeding. During this evaluation, patient was limited by cognitive deficits, dysfunction of BUEs with negative composite grip to LT or RT hand, need of assistance to open either hand, and profound weakness, which has the potential to impact patient's safety and independence during functional mobility, as well as performance for ADLs. [place] "6-clicks" Daily Activity Inpatient Short Form score of 8/24 this session. Patient lives with her family, who is able to provide 24/7 supervision and assistance.  Patient demonstrates guarded  rehab potential, and should benefit from continued skilled occupational therapy services while in acute care to maximize safety, independence and quality of life at home.  Continued occupational therapy services in a SNF setting prior to return home is  recommended.  ?        SNF    Equipment Recommendations  Hospital bed    Recommendations for Other Services       Precautions / Restrictions Precautions Precautions: Fall Restrictions Weight Bearing Restrictions: No Other Position/Activity Restrictions: WBAT per prior PT notes      Mobility Bed Mobility               General bed mobility comments: Per physical tehrapy Evaluation pt requires Total Assist of 2 people.  OT did not attempt with assist of 1 for safety.    Transfers                      Balance                                           ADL either performed or assessed with clinical judgement   ADL Overall ADL's : Needs assistance/impaired Eating/Feeding: Maximal assistance;Bed level Eating/Feeding Details (indicate cue type and reason): Provided red foam grip for utensils. Pt able to grip for short periods of time but drops frequently. Pt may need universal cuff. Pt uanble to place food on spoon and this must be done for her and handle manually placed in pt's hand with cues to grip. Pt then able to flex elbow and bring spoon to mouth but then drops spoon. Pt also picking at her plate, pinching at nothing and bringing fingers to mouth. Spouse giving cues telling pt that there is no food in her hand  but pt continuing to do this. Grooming: Wash/dry hands;Maximal assistance;Bed level   Upper Body Bathing: Total assistance;Bed level   Lower Body Bathing: Total assistance;Bed level   Upper Body Dressing : Bed level;Maximal assistance   Lower Body Dressing: Bed level;Total assistance     Toilet Transfer Details (indicate cue type and reason): Unable to assess.Would need 2nd person for safety. Toileting- Clothing Manipulation and Hygiene: Bed level;Total assistance               Vision         Perception     Praxis      Pertinent Vitals/Pain Pain Assessment: Faces Faces Pain Scale: No hurt     Hand Dominance  Right   Extremity/Trunk Assessment Upper Extremity Assessment Upper Extremity Assessment: RUE deficits/detail;LUE deficits/detail RUE Deficits / Details: Guarding due to IV. Needs max cues to use. Shoulder ROM to ~90 degrees, elbow guarding with IV, wrist/hand with limited ROM and pt unable to make composite grip of fully open hand without minimal assist. Very weak. Unable to test sensation due to current cognitive deficits. RUE: Unable to fully assess due to pain RUE Coordination: decreased fine motor;decreased gross motor LUE Deficits / Details: AROM of shoulder to ~120 degrees, elbow WFL, wrist to neutral, hand with negative composite grip. Very weak throughout. Unable to test sensation due to current cognitive deficits. LUE Coordination: decreased gross motor;decreased fine motor   Lower Extremity Assessment Lower Extremity Assessment: Defer to PT evaluation       Communication Communication Communication: Expressive difficulties (Speech is intelligible but a little slurred at times.)   Cognition Arousal/Alertness: Awake/alert Behavior During Therapy: Flat affect Overall Cognitive Status: Impaired/Different from baseline Area of Impairment: Orientation;Attention;Memory;Following commands;Awareness;Problem solving;Safety/judgement                 Orientation Level: Disoriented to;Place;Time;Situation (Could not select "hospital" from 3 choices. Stated place as "alternate") Current Attention Level: Focused Memory: Decreased short-term memory Following Commands: Follows one step commands inconsistently;Follows one step commands with increased time Safety/Judgement: Decreased awareness of deficits Awareness: Intellectual Problem Solving: Slow processing;Decreased initiation;Difficulty sequencing;Requires verbal cues;Requires tactile cues General Comments: increased time to answer questions, some inconsistencies in her responses to questions, 1-step commands must be frequently  repeated.   General Comments       Exercises Other Exercises Other Exercises: Max cues for pt to move her UEs ad lib while in bed and to stop guarding RUE and use functionally with hope of some return to UB function. Pt not demonstrating any movements back.   Shoulder Instructions      Home Living Family/patient expects to be discharged to:: Private residence Living Arrangements: Spouse/significant other;Children Available Help at Discharge: Family;Available 24 hours/day Type of Home: House Home Access: Stairs to enter [employer] of Steps: 3 Entrance Stairs-Rails: Right;Left Home Layout: One level     Bathroom Shower/Tub: Walk-in shower;Tub only (Spouse reports that pt does not usually enter the shower.)   Bathroom Toilet: Handicapped height     Home Equipment: Walker - 2 wheels;Walker - 4 wheels;Bedside commode;Shower seat;Toilet riser;Hand held shower head;Other (comment);Wheelchair - manual;Cane - single point   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 Fster daughter in room and difficult to arouse, mumbling and somewhat unintellible. Spoke with pt's daughter on phone per pt's  spouse's wishes. Daughter reports that prior to pt's fall and ORIF in July she was very active without cognitive deficits and ambulating with a cane in June. Pt reports that falls were due to BGL elevation as there was some confusion about pt's insulin. Since returning home, daughter and spouse both clarified that pt has taken no steps with home health but has stood twice, very stooped. ADL's / Homemaking Assistance Needed: Pt previously was independent with ADLs prior to her fall and ORIF in July per family. Unable to gather specifics as spouse increasingly  agitated with questions and calling all therapists and doctors "liars".  Pt's husband reports assisting with ADLs (i.e., feeding and bathing) after fall, but insists that prior to fall in July pt was performing all of her ADLs independently.  Since home from ORIF pt is Harrel Lemon to wheelchair and using bedpan to void. Bed level baths and total assist for feeding, grooming, dressing, bathing all with assist of family. Pt's spouse stated that pt currently not using her RUE to feed self due to her IV, but when asked why pt could not feed self at home wiithout IV spouse became agitated and sating, "Are you saying that I'm lying?"   Comments: Family adamantly deny h/o cognitice deficits. They state that pt just lost her youngest brother who she spoke to every day and the pt is grieving. Pt was very confused during OT Evaluation and grief does not necessary explain pt's current level of cognitive deficit.        OT Problem List: Decreased strength;Decreased coordination;Decreased range of motion;Decreased cognition;Decreased activity tolerance;Decreased safety awareness;Impaired balance (sitting and/or standing);Decreased knowledge of use of DME or AE;Impaired UE functional use;Decreased knowledge of precautions      OT Treatment/Interventions: Self-care/ADL training;Therapeutic exercise;Splinting;Therapeutic activities;Neuromuscular education;Cognitive remediation/compensation;DME and/or AE instruction;Patient/family education;Balance training;Manual therapy    OT Goals(Current goals can be found in the care plan section) Acute Rehab OT Goals Patient Stated Goal: Per spouse, for pt to resume walking. OT Goal Formulation: With patient/family Time For Goal Achievement: 06/02/21 Potential to Achieve Goals: Fair ADL Goals Pt Will Perform Eating: with min assist;with adaptive utensils;with assist to don/doff brace/orthosis;sitting;bed level Pt Will Perform Grooming: bed level;sitting;with min assist;with  adaptive equipment;with caregiver independent in assisting Pt Will Transfer to Toilet: with mod assist;stand pivot transfer;with transfer board;bedside commode Pt/caregiver will Perform Home Exercise Program: Increased ROM;Increased strength;With minimal assist;Both right and left upper extremity Additional ADL Goal #1: Pt will improve sitting balance to good static and fair+ dynamic in order to increase safety and participation during seated ADLs. Additional ADL Goal #2: Pt will engage in 15 min seated functional activities with use of BUEs on table top tasks without loss of balance, in order to demonstrate improved activity tolerance and balance needed to perform ADLs safely at home.  OT Frequency: Min 2X/week   Barriers to D/C: Inaccessible home environment  3 steps to enter       Co-evaluation              AM-PAC OT "6 Clicks" Daily Activity     Outcome Measure Help from another person eating meals?: A Lot Help from another person taking care of personal grooming?: A Lot Help from another person toileting, which includes using toliet, bedpan, or urinal?: Total Help from another person bathing (including washing, rinsing, drying)?: Total Help from another person to put on and taking off regular upper body clothing?: Total Help from another person to put on and taking off regular lower  body clothing?: Total 6 Click Score: 8   End of Session Equipment Utilized During Treatment: Other (comment) (adaptive feeding handle) Nurse Communication: Need for lift equipment;Other (comment) (Prior level at home. Spouse reaction to questions.)  Activity Tolerance: Treatment limited secondary to agitation (Spouse agitation) Patient left: in bed;with call bell/phone within reach;with family/visitor present  OT Visit Diagnosis: Unsteadiness on feet (R26.81);History of falling (Z91.81);Muscle weakness (generalized) (M62.81);Other symptoms and signs involving cognitive function;Feeding difficulties  (R63.3);Other abnormalities of gait and mobility (R26.89)                Time: [id number] OT Time Calculation (min): 44 min Charges:  OT General Charges $OT Visit: 1 Visit OT Evaluation $OT Eval High Complexity: 1 High OT Treatments $Self Care/Home Management : 8-22 mins  Anderson Malta, [place] Office: [phone] 05/19/2021  Julien Girt 05/19/2021, 1:42 PM

## 2021-05-19 NOTE — TOC Progression Note (Signed)
Transition of Care [facility]) - Progression Note    Patient Details  Name: [name] MRN: [id number] Date of Birth: March 24, 1951  [facility] CM/SW Contact  Lennart Pall, [place] Phone Number: 05/19/2021, 2:50 PM  Clinical Narrative:    Pt and spouse have accepted SNF bed offer from [facility] and I am currently awaiting confirmation from facility that they can accept and when they could admit.   Expected Discharge Plan: [place] (if agreeable) Barriers to Discharge: Continued Medical Work up  Expected Discharge Plan and Services Expected Discharge Plan: [place] (if agreeable) In-house Referral: Clinical Social Work     Living arrangements for the past 2 months: Single Family Home                                       Social Determinants of Health (SDOH) Interventions    Readmission Risk Interventions Readmission Risk Prevention Plan 04/24/2021  Transportation Screening Complete  PCP or Specialist appointment within 3-5 days of discharge Complete  HRI or [place] Complete  SW Recovery Care/Counseling Consult Complete  [place] Not Applicable  Some recent data might be hidden

## 2021-05-19 NOTE — Progress Notes (Signed)
PROGRESS NOTE    [name]  [id number] DOB: [birth year] DOA: 05/17/2021 PCP: Biagio Borg, MD     Brief Narrative:  [name] is a 70 y.o. female with history of diabetes mellitus type 2, hypertension, chronic kidney disease stage III, hypothyroidism who was admitted last month for right femur fracture status post ORIF was brought to the ER after patient was finding it increasingly difficult to ambulate because of weakness.  Patient also has been a right thigh pain and has not moved her bowels for almost 5 days.  CT abdomen pelvis shows constipation.  X-ray of the right femur does not show any acute findings.   New events last 24 hours / Subjective: No acute events overnight, patient is tearful, states that she is trying to figure out where her husband is.  Her son is at bedside.  She is not oriented to place or time.  Assessment & Plan:   Principal Problem:   Generalized weakness Active Problems:   Macrocytic anemia   Hypertension associated with diabetes ([place])   Hypothyroidism   CKD (chronic kidney disease), stage III (HCC)   Generalized weakness -Secondary to deconditioning -Case was discussed with neurology over the phone who reviewed the patient's chart.  CK remains in normal range which rules out myopathy.  Patient's previous neurological work-up for altered mental status revealed possible start of dementia.  Her lower extremity weakness may also be complicated by [id number] degenerative spinal stenosis, L5-S1 mild disc narrowing and bulging, which is present on CT lumbar spine in July 2022. No further work-up recommended.   -PT OT rec SNF placement, TOC on board  Cognitive impairment, likely dementia -Delirium precaution  Hypertension -Continue Norvasc, Catapres, HCTZ, Cozaar, Toprol  CAD -Continue Imdur  Hyperlipidemia -Continue Lipitor  Hypothyroidism -TSH high but free T4 normal  -Continue Synthroid  Diabetes mellitus -Continue Lantus, NovoLog sliding  scale  Recent right femur fracture status post ORIF -Right femur xray: No change or unexpected finding. Healing fracture of the distal femur.  Depression -Continue Prozac  DVT prophylaxis:  enoxaparin (LOVENOX) injection 40 mg Start: 05/18/21 1000  Code Status:     Code Status Orders  (From admission, onward)           Start     Ordered   05/17/21 2359  Full code  Continuous        05/18/21 0000           Code Status History     Date Active Date Inactive Code Status Order ID Comments User Context   04/15/2021 0732 04/25/2021 1042 Full Code [id number]  Norval Morton, MD ED   11/09/2020 2243 11/11/2020 2306 Full Code [id number]  Lenore Cordia, MD ED   01/26/2020 0543 01/27/2020 2353 Full Code [id number]  Dwyane Dee, MD ED   08/13/2017 1611 08/19/2017 1853 Full Code [id number]  Justice Britain, PA-C Inpatient   05/01/2017 2324 05/03/2017 1832 Full Code [id number]  Ivor Costa, MD ED   04/03/2016 1958 04/05/2016 1502 Full Code [id number]  McKenzie, Lennie Muckle, PA-C Inpatient      Family Communication: Son at bedside, daughter on speaker phone Disposition Plan:  Status is: Inpatient  Remains inpatient appropriate because:Unsafe d/c plan  Dispo: The patient is from: Home              Anticipated d/c is to: SNF              Patient currently is medically stable to d/c. Awaiting  SNF placement.    Difficult to place patient No      Consultants:  None  Procedures:  None   Antimicrobials:  Anti-infectives (From admission, onward)    None        Objective: Vitals:   05/18/21 1000 05/18/21 1350 05/18/21 2019 05/19/21 0524  BP: (!) 158/88 (!) 142/77 126/73 131/81  Pulse: 90 (!) 101 96 90  Resp: '18 18 18 15  '$ Temp: 98.5 F (36.9 C)  98.9 F (37.2 C) 98.8 F (37.1 C)  TempSrc: Oral  Oral Oral  SpO2: 98% 100% 100% 100%  Weight:      Height:        Intake/Output Summary (Last 24 hours) at 05/19/2021 1029 Last data filed at 05/19/2021 1000 Gross per 24 hour   Intake 390 ml  Output 500 ml  Net -110 ml   Filed Weights   05/17/21 1629  Weight: 119.3 kg    Examination:  General exam: Appears comfortable  Respiratory system: Clear to auscultation. Respiratory effort normal. No respiratory distress. No conversational dyspnea.  Cardiovascular system: S1 & S2 heard, RRR. No murmurs. No pedal edema. Gastrointestinal system: Abdomen is nondistended, soft and nontender. Normal bowel sounds heard. Central nervous system: Alert and oriented to self only  Extremities: Symmetric in appearance  Skin: No rashes, lesions or ulcers on exposed skin  Psychiatry: Judgement and insight appear poor   Data Reviewed: I have personally reviewed following labs and imaging studies  CBC: Recent Labs  Lab 05/17/21 1653 05/17/21 1739 05/18/21 0449  WBC 7.0  --  9.0  NEUTROABS 4.2  --   --   HGB 10.2* 10.2* 10.1*  HCT 31.2* 30.0* 32.0*  MCV 104.0*  --  105.6*  PLT 270  --  123456   Basic Metabolic Panel: Recent Labs  Lab 05/17/21 1653 05/17/21 1739 05/18/21 0449  NA 137 140 141  K 3.5 3.5 3.5  CL 113*  --  114*  CO2 14*  --  17*  GLUCOSE 159*  --  99  BUN 12  --  11  CREATININE 1.10*  --  0.94  CALCIUM 9.4  --  9.7   GFR: Estimated Creatinine Clearance: 71.6 mL/min (by C-G formula based on SCr of 0.94 mg/dL). Liver Function Tests: Recent Labs  Lab 05/17/21 1653 05/18/21 0449  AST 18 16  ALT 13 14  ALKPHOS 76 75  BILITOT 0.6 0.9  PROT 6.7 6.7  ALBUMIN 3.3* 3.4*   Recent Labs  Lab 05/17/21 1653  LIPASE 31   No results for input(s): AMMONIA in the last 168 hours. Coagulation Profile: No results for input(s): INR, PROTIME in the last 168 hours. Cardiac Enzymes: Recent Labs  Lab 05/18/21 0449  CKTOTAL 90   BNP (last 3 results) No results for input(s): PROBNP in the last 8760 hours. HbA1C: No results for input(s): HGBA1C in the last 72 hours. CBG: Recent Labs  Lab 05/18/21 0753 05/18/21 1144 05/18/21 1622 05/18/21 2149  05/19/21 0731  GLUCAP 99 114* 157* 109* 102*   Lipid Profile: No results for input(s): CHOL, HDL, LDLCALC, TRIG, CHOLHDL, LDLDIRECT in the last 72 hours. Thyroid Function Tests: Recent Labs    05/18/21 0449  TSH 36.944*  FREET4 0.65   Anemia Panel: Recent Labs    05/18/21 0449  VITAMINB12 1,515*  FOLATE 20.9   Sepsis Labs: Recent Labs  Lab 05/17/21 1733  LATICACIDVEN 1.5    Recent Results (from the past 240 hour(s))  Resp Panel by RT-PCR (  Flu A&B, Covid) Nasopharyngeal Swab     Status: None   Collection Time: 05/17/21  6:37 PM   Specimen: Nasopharyngeal Swab; Nasopharyngeal(NP) swabs in vial transport medium  Result Value Ref Range Status   SARS Coronavirus 2 by RT PCR NEGATIVE NEGATIVE Final    Comment: (NOTE) SARS-CoV-2 target nucleic acids are NOT DETECTED.  The SARS-CoV-2 RNA is generally detectable in upper respiratory specimens during the acute phase of infection. The lowest concentration of SARS-CoV-2 viral copies this assay can detect is 138 copies/mL. A negative result does not preclude SARS-Cov-2 infection and should not be used as the sole basis for treatment or other patient management decisions. A negative result may occur with  improper specimen collection/handling, submission of specimen other than nasopharyngeal swab, presence of viral mutation(s) within the areas targeted by this assay, and inadequate number of viral copies(<138 copies/mL). A negative result must be combined with clinical observations, patient history, and epidemiological information. The expected result is Negative.  Fact Sheet for Patients:  EntrepreneurPulse.com.au  Fact Sheet for Healthcare Providers:  IncredibleEmployment.be  This test is no t yet approved or cleared by the [place] FDA and  has been authorized for detection and/or diagnosis of SARS-CoV-2 by FDA under an Emergency Use Authorization (EUA). This EUA will remain  in  effect (meaning this test can be used) for the duration of the COVID-19 declaration under Section 564(b)(1) of the Act, 21 U.S.C.section 360bbb-3(b)(1), unless the authorization is terminated  or revoked sooner.       Influenza A by PCR NEGATIVE NEGATIVE Final   Influenza B by PCR NEGATIVE NEGATIVE Final    Comment: (NOTE) The Xpert Xpress SARS-CoV-2/FLU/RSV plus assay is intended as an aid in the diagnosis of influenza from Nasopharyngeal swab specimens and should not be used as a sole basis for treatment. Nasal washings and aspirates are unacceptable for Xpert Xpress SARS-CoV-2/FLU/RSV testing.  Fact Sheet for Patients: EntrepreneurPulse.com.au  Fact Sheet for Healthcare Providers: IncredibleEmployment.be  This test is not yet approved or cleared by the [place] FDA and has been authorized for detection and/or diagnosis of SARS-CoV-2 by FDA under an Emergency Use Authorization (EUA). This EUA will remain in effect (meaning this test can be used) for the duration of the COVID-19 declaration under Section 564(b)(1) of the Act, 21 U.S.C. section 360bbb-3(b)(1), unless the authorization is terminated or revoked.  Performed at [facility], [address] [place], [place] [address]       Radiology Studies: CT ABDOMEN PELVIS WO CONTRAST  Result Date: 05/17/2021 CLINICAL DATA:  Right knee pain and constipation. Previous orthopedic surgery right femur. EXAM: CT ABDOMEN AND PELVIS WITHOUT CONTRAST TECHNIQUE: Multidetector CT imaging of the abdomen and pelvis was performed following the standard protocol without IV contrast. COMPARISON:  04/15/2021 FINDINGS: Lower chest: Lung bases are clear.  Spinal neurostimulator in place. Hepatobiliary: Liver parenchyma appears normal without contrast. Previous cholecystectomy. Pancreas: Normal Spleen: Normal Adrenals/Urinary Tract: Adrenal glands are normal. Kidneys are normal without contrast.  Bladder is normal. Stomach/Bowel: Previous gastric surgery. No acute finding. Small bowel pattern is normal. Moderate amount of fecal matter in the colon that could go along with constipation, with a larger amount in the rectosigmoid region. No sign of diverticulosis or diverticulitis. Vascular/Lymphatic: Aortic atherosclerosis. No aneurysm. IVC is normal. No adenopathy. Reproductive: Previous hysterectomy.  No pelvic mass. Other: No free fluid or air. Left lateral abdominal wall hernia and left inguinal hernia containing fat as seen previously. Musculoskeletal: No acute bone  finding. Previous lumbar region discectomy and fusion. IMPRESSION: Patient does have a large amount of fecal matter throughout the colon, particularly in the rectosigmoid region, which could go along with the clinical diagnosis of constipation. No acute finding otherwise. Electronically Signed   By: Nelson Chimes M.D.   On: 05/17/2021 17:50   DG Femur Min 2 Views Right  Result Date: 05/17/2021 CLINICAL DATA:  Right knee pain surgery EXAM: RIGHT FEMUR 2 VIEWS COMPARISON:  04/16/2021 FINDINGS: Previous ORIF with lateral femoral plate and screws. Previous knee replacement. Fracture line of the distal femur remains visible but is not changed in position or alignment. No unexpected or acute finding. IMPRESSION: No change or unexpected finding. Healing fracture of the distal femur. Electronically Signed   By: Nelson Chimes M.D.   On: 05/17/2021 17:47      Scheduled Meds:  amLODipine  10 mg Oral Daily   atorvastatin  40 mg Oral QHS   cloNIDine  0.2 mg Oral BID   enoxaparin (LOVENOX) injection  40 mg Subcutaneous Q24H   FLUoxetine  20 mg Oral Daily   losartan  100 mg Oral Daily   And   hydrochlorothiazide  25 mg Oral Daily   insulin aspart  0-9 Units Subcutaneous TID WC   insulin glargine  30 Units Subcutaneous Daily   isosorbide mononitrate  60 mg Oral Daily   levothyroxine  50 mcg Oral Q0600   linaclotide  72 mcg Oral QAC breakfast    loratadine  10 mg Oral Daily   megestrol  40 mg Oral Daily   metoprolol succinate  50 mg Oral Daily   pantoprazole  40 mg Oral Daily   pentosan polysulfate  200 mg Oral BID   polyethylene glycol  17 g Oral Daily   Continuous Infusions:   LOS: 1 day      Time spent: 25 minutes   Dessa Phi, DO [facility] 05/19/2021, 10:29 AM   Available via Epic secure chat 7am-7pm After these hours, please refer to coverage provider listed on [email or web address]

## 2021-05-20 LAB — GLUCOSE, CAPILLARY
Glucose-Capillary: 103 mg/dL — ABNORMAL HIGH (ref 70–99)
Glucose-Capillary: 109 mg/dL — ABNORMAL HIGH (ref 70–99)
Glucose-Capillary: 161 mg/dL — ABNORMAL HIGH (ref 70–99)
Glucose-Capillary: 163 mg/dL — ABNORMAL HIGH (ref 70–99)
Glucose-Capillary: 160 mg/dL — ABNORMAL HIGH (ref 70–99)

## 2021-05-20 MED ORDER — GLYCERIN (LAXATIVE) 2 G RE SUPP
1.0000 | Freq: Every day | RECTAL | Status: DC | PRN
  Administered 2021-05-20: 1 via RECTAL
  Filled 2021-05-20 (×3): qty 1

## 2021-05-20 MED ORDER — DOCUSATE SODIUM 100 MG PO CAPS: 100.0000 mg | ORAL_CAPSULE | Freq: Two times a day (BID) | ORAL | Status: DC | PRN

## 2021-05-20 MED ORDER — KETOROLAC TROMETHAMINE 15 MG/ML IJ SOLN
15.0000 mg | Freq: Once | INTRAMUSCULAR | Status: AC
  Administered 2021-05-20: 15 mg via INTRAVENOUS
  Filled 2021-05-20: qty 1

## 2021-05-20 MED ORDER — SIMETHICONE 80 MG PO CHEW
80.0000 mg | CHEWABLE_TABLET | Freq: Four times a day (QID) | ORAL | Status: DC | PRN
  Administered 2021-05-20: 80 mg via ORAL
  Filled 2021-05-20: qty 1

## 2021-05-20 MED ORDER — MAGNESIUM HYDROXIDE 400 MG/5ML PO SUSP: 15.0000 mL | Freq: Every day | ORAL | Status: DC | PRN

## 2021-05-20 NOTE — Progress Notes (Signed)
Pt has no urine output for the shift, bladder scan showed 265m of urine; provider made aware and ordered for in and out cath with 3029mof urine taken out; provider made aware; will continue to monitor pt.

## 2021-05-20 NOTE — Progress Notes (Addendum)
Complain PROGRESS NOTE    [name]  [id number] DOB: 06/20/1951 DOA: 05/17/2021 PCP: Biagio Borg, MD     Brief Narrative:  [name] is a 70 y.o. female with history of diabetes mellitus type 2, hypertension, chronic kidney disease stage III, hypothyroidism who was admitted last month for right femur fracture status post ORIF was brought to the ER after patient was finding it increasingly difficult to ambulate because of weakness.  Patient also has been a right thigh pain and has not moved her bowels for almost 5 days.  CT abdomen pelvis shows constipation.  X-ray of the right femur does not show any acute findings. Patient was admitted for generalized weakness.   Pt evaluated by Dr. Leta [facility] in 07/2018 for gait and balance difficulty. At that time, it was noted she was having issues with gait and balance since 2016, worsened following back surgeries for spinal stenosis. Was rec for PT evaluation and pain management.   Pt evaluated by Neurology 04/2021 during hospitalization for right femur fracture.  At that time, husband had noted that patient had been weak at home with confusion, not acting herself for about a week with weight loss and depressed mood.  She was having falls at home which led to right femur fracture.  At that time, her confusion and weakness were thought to be driven by fluctuating thyroid levels, possibly early stage of degenerative dementing process such as Alzheimer's, polypharmacy including gabapentin, tizanidine, narcotic use.  At that time, they recommended minimizing narcotics, decreasing or stopping gabapentin, decreasing or stopping Robaxin, avoid anticholinergic and other medications on the beers list, continue delirium precautions.  Recommended [facility] work-up as outpatient and was referred for outpatient neurology.  New events last 24 hours / Subjective: She is sitting in bed.  She remains confused but may be better from admission.  She is oriented  to self only today, does not have physical complaints.   Addendum: Paged by RN regarding patient's family requesting [place] to do something about her hernia, transfer to [facility]. Discussed with RN regarding patient's husband's concerns. I reviewed her chart and see that she has had left lateral wall hernia since June 2019 which has been stable in appearance. She has been having smears of stool per RN. Husband wants [place] to help her with her gas, requesting colonoscopy prep. I've ordered some stool softeners, laxatives, suppository and enema prn to help with any constipation issues. Colonoscopy prep would be last resort, and I'm not sure she would even tolerate it in either case. I also do not see any medical indication for hernia repair or meeting criteria for transfer to [facility] at this time.   Assessment & Plan:   Principal Problem:   Generalized weakness Active Problems:   Macrocytic anemia   Hypertension associated with diabetes ([place])   Hypothyroidism   CKD (chronic kidney disease), stage III (HCC)   Generalized weakness -Secondary to deconditioning -Case was discussed with neurology over the phone who reviewed the patient's chart.  CK remains in normal range which rules out myopathy.  Patient's previous neurological work-up for altered mental status revealed possible start of dementia.  Her lower extremity weakness may also be complicated by Q000111Q degenerative spinal stenosis, L5-S1 mild disc narrowing and bulging, which is present on CT lumbar spine in July 2022. No further work-up recommended.   -PT OT rec SNF placement, TOC on board  Cognitive impairment, likely dementia -Delirium precaution  Hypertension -Continue Norvasc, Catapres, HCTZ, Cozaar, Toprol  CAD -Continue Imdur  Hyperlipidemia -Continue Lipitor  Hypothyroidism -TSH high but free T4 normal  -Continue Synthroid  Diabetes mellitus -Continue Lantus, NovoLog sliding scale  Recent right femur fracture status post  ORIF -Right femur xray: No change or unexpected finding. Healing fracture of the distal femur.  Depression -Continue Prozac  DVT prophylaxis:  enoxaparin (LOVENOX) injection 40 mg Start: 05/18/21 1000  Code Status:     Code Status Orders  (From admission, onward)           Start     Ordered   05/17/21 2359  Full code  Continuous        05/18/21 0000           Code Status History     Date Active Date Inactive Code Status Order ID Comments User Context   04/15/2021 0732 04/25/2021 1042 Full Code [id number]  Norval Morton, MD ED   11/09/2020 2243 11/11/2020 2306 Full Code [id number]  Lenore Cordia, MD ED   01/26/2020 0543 01/27/2020 2353 Full Code [id number]  Dwyane Dee, MD ED   08/13/2017 1611 08/19/2017 1853 Full Code [id number]  Justice Britain, PA-C Inpatient   05/01/2017 2324 05/03/2017 1832 Full Code [id number]  Ivor Costa, MD ED   04/03/2016 1958 04/05/2016 1502 Full Code [id number]  McKenzie, Lennie Muckle, PA-C Inpatient      Family Communication: No family at bedside  Disposition Plan:  Status is: Inpatient  Remains inpatient appropriate because:Unsafe d/c plan  Dispo: The patient is from: Home              Anticipated d/c is to: SNF              Patient currently is medically stable to d/c. Awaiting SNF placement.    Difficult to place patient No      Consultants:  None  Procedures:  None   Antimicrobials:  Anti-infectives (From admission, onward)    None        Objective: Vitals:   05/19/21 1355 05/19/21 2216 05/19/21 2251 05/20/21 0412  BP: (!) 127/91 124/76 128/83 137/66  Pulse: (!) 108 88 89 86  Resp: '18 16 20 16  '$ Temp: 99.2 F (37.3 C)  98.3 F (36.8 C) 98.8 F (37.1 C)  TempSrc: Oral   Oral  SpO2: 100% 100% 100% 100%  Weight:      Height:        Intake/Output Summary (Last 24 hours) at 05/20/2021 1137 Last data filed at 05/20/2021 0600 Gross per 24 hour  Intake 720 ml  Output 900 ml  Net -180 ml    Filed Weights   05/17/21  1629  Weight: 119.3 kg    Examination: General exam: Appears calm and comfortable  Respiratory system: Clear to auscultation. Respiratory effort normal. Cardiovascular system: S1 & S2 heard, RRR. No pedal edema. Gastrointestinal system: Abdomen is nondistended, soft and nontender. Normal bowel sounds heard. Central nervous system: Alert and oriented to self only. Speech clear  Extremities: Symmetric in appearance bilaterally  Skin: No rashes, lesions or ulcers on exposed skin  Psychiatry: Judgement and insight appear poor     Data Reviewed: I have personally reviewed following labs and imaging studies  CBC: Recent Labs  Lab 05/17/21 1653 05/17/21 1739 05/18/21 0449  WBC 7.0  --  9.0  NEUTROABS 4.2  --   --   HGB 10.2* 10.2* 10.1*  HCT 31.2* 30.0* 32.0*  MCV 104.0*  --  105.6*  PLT 270  --  123456    Basic Metabolic Panel: Recent Labs  Lab 05/17/21 1653 05/17/21 1739 05/18/21 0449  NA 137 140 141  K 3.5 3.5 3.5  CL 113*  --  114*  CO2 14*  --  17*  GLUCOSE 159*  --  99  BUN 12  --  11  CREATININE 1.10*  --  0.94  CALCIUM 9.4  --  9.7    GFR: Estimated Creatinine Clearance: 71.6 mL/min (by C-G formula based on SCr of 0.94 mg/dL). Liver Function Tests: Recent Labs  Lab 05/17/21 1653 05/18/21 0449  AST 18 16  ALT 13 14  ALKPHOS 76 75  BILITOT 0.6 0.9  PROT 6.7 6.7  ALBUMIN 3.3* 3.4*    Recent Labs  Lab 05/17/21 1653  LIPASE 31    No results for input(s): AMMONIA in the last 168 hours. Coagulation Profile: No results for input(s): INR, PROTIME in the last 168 hours. Cardiac Enzymes: Recent Labs  Lab 05/18/21 0449  CKTOTAL 90    BNP (last 3 results) No results for input(s): PROBNP in the last 8760 hours. HbA1C: No results for input(s): HGBA1C in the last 72 hours. CBG: Recent Labs  Lab 05/19/21 1147 05/19/21 1710 05/20/21 0602 05/20/21 0725 05/20/21 1135  GLUCAP 107* 110* 103* 109* 161*    Lipid Profile: No results for input(s):  CHOL, HDL, LDLCALC, TRIG, CHOLHDL, LDLDIRECT in the last 72 hours. Thyroid Function Tests: Recent Labs    05/18/21 0449  TSH 36.944*  FREET4 0.65    Anemia Panel: Recent Labs    05/18/21 0449  VITAMINB12 1,515*  FOLATE 20.9    Sepsis Labs: Recent Labs  Lab 05/17/21 1733  LATICACIDVEN 1.5     Recent Results (from the past 240 hour(s))  Resp Panel by RT-PCR (Flu A&B, Covid) Nasopharyngeal Swab     Status: None   Collection Time: 05/17/21  6:37 PM   Specimen: Nasopharyngeal Swab; Nasopharyngeal(NP) swabs in vial transport medium  Result Value Ref Range Status   SARS Coronavirus 2 by RT PCR NEGATIVE NEGATIVE Final    Comment: (NOTE) SARS-CoV-2 target nucleic acids are NOT DETECTED.  The SARS-CoV-2 RNA is generally detectable in upper respiratory specimens during the acute phase of infection. The lowest concentration of SARS-CoV-2 viral copies this assay can detect is 138 copies/mL. A negative result does not preclude SARS-Cov-2 infection and should not be used as the sole basis for treatment or other patient management decisions. A negative result may occur with  improper specimen collection/handling, submission of specimen other than nasopharyngeal swab, presence of viral mutation(s) within the areas targeted by this assay, and inadequate number of viral copies(<138 copies/mL). A negative result must be combined with clinical observations, patient history, and epidemiological information. The expected result is Negative.  Fact Sheet for Patients:  EntrepreneurPulse.com.au  Fact Sheet for Healthcare Providers:  IncredibleEmployment.be  This test is no t yet approved or cleared by the [place] FDA and  has been authorized for detection and/or diagnosis of SARS-CoV-2 by FDA under an Emergency Use Authorization (EUA). This EUA will remain  in effect (meaning this test can be used) for the duration of the COVID-19 declaration  under Section 564(b)(1) of the Act, 21 U.S.C.section 360bbb-3(b)(1), unless the authorization is terminated  or revoked sooner.       Influenza A by PCR NEGATIVE NEGATIVE Final   Influenza B by PCR NEGATIVE NEGATIVE Final    Comment: (NOTE) The Xpert Xpress SARS-CoV-2/FLU/RSV plus assay is intended as an aid  in the diagnosis of influenza from Nasopharyngeal swab specimens and should not be used as a sole basis for treatment. Nasal washings and aspirates are unacceptable for Xpert Xpress SARS-CoV-2/FLU/RSV testing.  Fact Sheet for Patients: EntrepreneurPulse.com.au  Fact Sheet for Healthcare Providers: IncredibleEmployment.be  This test is not yet approved or cleared by the [place] FDA and has been authorized for detection and/or diagnosis of SARS-CoV-2 by FDA under an Emergency Use Authorization (EUA). This EUA will remain in effect (meaning this test can be used) for the duration of the COVID-19 declaration under Section 564(b)(1) of the Act, 21 U.S.C. section 360bbb-3(b)(1), unless the authorization is terminated or revoked.  Performed at [facility], [address] [place], [place] [address]        Radiology Studies: No results found.    Scheduled Meds:  amLODipine  10 mg Oral Daily   atorvastatin  40 mg Oral QHS   cloNIDine  0.2 mg Oral BID   enoxaparin (LOVENOX) injection  40 mg Subcutaneous Q24H   feeding supplement (GLUCERNA SHAKE)  237 mL Oral TID BM   FLUoxetine  20 mg Oral Daily   losartan  100 mg Oral Daily   And   hydrochlorothiazide  25 mg Oral Daily   insulin aspart  0-9 Units Subcutaneous TID WC   insulin glargine  30 Units Subcutaneous Daily   isosorbide mononitrate  60 mg Oral Daily   levothyroxine  50 mcg Oral Q0600   linaclotide  72 mcg Oral QAC breakfast   loratadine  10 mg Oral Daily   megestrol  400 mg Oral Daily   metoprolol succinate  50 mg Oral Daily   pantoprazole  40 mg Oral  Daily   pentosan polysulfate  200 mg Oral BID   polyethylene glycol  17 g Oral Daily   Continuous Infusions:   LOS: 2 days      Time spent: 25 minutes   Dessa Phi, DO [facility] 05/20/2021, 11:37 AM   Available via Epic secure chat 7am-7pm After these hours, please refer to coverage provider listed on [email or web address]

## 2021-05-21 DIAGNOSIS — R1312 Dysphagia, oropharyngeal phase: Secondary | ICD-10-CM | POA: Diagnosis not present

## 2021-05-21 DIAGNOSIS — M791 Myalgia, unspecified site: Secondary | ICD-10-CM | POA: Diagnosis not present

## 2021-05-21 DIAGNOSIS — D539 Nutritional anemia, unspecified: Secondary | ICD-10-CM | POA: Diagnosis not present

## 2021-05-21 DIAGNOSIS — M545 Low back pain, unspecified: Secondary | ICD-10-CM | POA: Diagnosis not present

## 2021-05-21 DIAGNOSIS — R410 Disorientation, unspecified: Secondary | ICD-10-CM | POA: Diagnosis not present

## 2021-05-21 DIAGNOSIS — E1122 Type 2 diabetes mellitus with diabetic chronic kidney disease: Secondary | ICD-10-CM | POA: Diagnosis not present

## 2021-05-21 DIAGNOSIS — Z7401 Bed confinement status: Secondary | ICD-10-CM | POA: Diagnosis not present

## 2021-05-21 DIAGNOSIS — R404 Transient alteration of awareness: Secondary | ICD-10-CM | POA: Diagnosis not present

## 2021-05-21 DIAGNOSIS — R2681 Unsteadiness on feet: Secondary | ICD-10-CM | POA: Diagnosis not present

## 2021-05-21 DIAGNOSIS — I129 Hypertensive chronic kidney disease with stage 1 through stage 4 chronic kidney disease, or unspecified chronic kidney disease: Secondary | ICD-10-CM | POA: Diagnosis not present

## 2021-05-21 DIAGNOSIS — R5381 Other malaise: Secondary | ICD-10-CM | POA: Diagnosis not present

## 2021-05-21 DIAGNOSIS — Z7982 Long term (current) use of aspirin: Secondary | ICD-10-CM | POA: Diagnosis not present

## 2021-05-21 DIAGNOSIS — F09 Unspecified mental disorder due to known physiological condition: Secondary | ICD-10-CM

## 2021-05-21 DIAGNOSIS — I2510 Atherosclerotic heart disease of native coronary artery without angina pectoris: Secondary | ICD-10-CM | POA: Diagnosis not present

## 2021-05-21 DIAGNOSIS — Z79899 Other long term (current) drug therapy: Secondary | ICD-10-CM | POA: Diagnosis not present

## 2021-05-21 DIAGNOSIS — N183 Chronic kidney disease, stage 3 unspecified: Secondary | ICD-10-CM | POA: Diagnosis not present

## 2021-05-21 DIAGNOSIS — I10 Essential (primary) hypertension: Secondary | ICD-10-CM | POA: Diagnosis not present

## 2021-05-21 DIAGNOSIS — D649 Anemia, unspecified: Secondary | ICD-10-CM | POA: Diagnosis not present

## 2021-05-21 DIAGNOSIS — E785 Hyperlipidemia, unspecified: Secondary | ICD-10-CM | POA: Diagnosis not present

## 2021-05-21 DIAGNOSIS — Z794 Long term (current) use of insulin: Secondary | ICD-10-CM | POA: Diagnosis not present

## 2021-05-21 DIAGNOSIS — E119 Type 2 diabetes mellitus without complications: Secondary | ICD-10-CM | POA: Diagnosis not present

## 2021-05-21 DIAGNOSIS — R4189 Other symptoms and signs involving cognitive functions and awareness: Secondary | ICD-10-CM | POA: Diagnosis not present

## 2021-05-21 DIAGNOSIS — G629 Polyneuropathy, unspecified: Secondary | ICD-10-CM | POA: Diagnosis not present

## 2021-05-21 DIAGNOSIS — R41841 Cognitive communication deficit: Secondary | ICD-10-CM | POA: Diagnosis not present

## 2021-05-21 DIAGNOSIS — M6281 Muscle weakness (generalized): Secondary | ICD-10-CM | POA: Diagnosis not present

## 2021-05-21 DIAGNOSIS — E039 Hypothyroidism, unspecified: Secondary | ICD-10-CM | POA: Diagnosis not present

## 2021-05-21 DIAGNOSIS — R4182 Altered mental status, unspecified: Secondary | ICD-10-CM | POA: Diagnosis not present

## 2021-05-21 DIAGNOSIS — E1165 Type 2 diabetes mellitus with hyperglycemia: Secondary | ICD-10-CM | POA: Diagnosis not present

## 2021-05-21 DIAGNOSIS — M5410 Radiculopathy, site unspecified: Secondary | ICD-10-CM | POA: Diagnosis not present

## 2021-05-21 DIAGNOSIS — N179 Acute kidney failure, unspecified: Secondary | ICD-10-CM | POA: Diagnosis not present

## 2021-05-21 DIAGNOSIS — I959 Hypotension, unspecified: Secondary | ICD-10-CM | POA: Diagnosis not present

## 2021-05-21 DIAGNOSIS — S72491D Other fracture of lower end of right femur, subsequent encounter for closed fracture with routine healing: Secondary | ICD-10-CM | POA: Diagnosis not present

## 2021-05-21 DIAGNOSIS — Z20822 Contact with and (suspected) exposure to covid-19: Secondary | ICD-10-CM | POA: Diagnosis not present

## 2021-05-21 DIAGNOSIS — Z87891 Personal history of nicotine dependence: Secondary | ICD-10-CM | POA: Diagnosis not present

## 2021-05-21 DIAGNOSIS — R531 Weakness: Secondary | ICD-10-CM | POA: Diagnosis not present

## 2021-05-21 DIAGNOSIS — E1159 Type 2 diabetes mellitus with other circulatory complications: Secondary | ICD-10-CM | POA: Diagnosis not present

## 2021-05-21 DIAGNOSIS — I152 Hypertension secondary to endocrine disorders: Secondary | ICD-10-CM | POA: Diagnosis not present

## 2021-05-21 DIAGNOSIS — Z9181 History of falling: Secondary | ICD-10-CM | POA: Diagnosis not present

## 2021-05-21 DIAGNOSIS — R2689 Other abnormalities of gait and mobility: Secondary | ICD-10-CM | POA: Diagnosis not present

## 2021-05-21 DIAGNOSIS — E860 Dehydration: Secondary | ICD-10-CM | POA: Diagnosis not present

## 2021-05-21 DIAGNOSIS — E11649 Type 2 diabetes mellitus with hypoglycemia without coma: Secondary | ICD-10-CM | POA: Diagnosis not present

## 2021-05-21 DIAGNOSIS — E1169 Type 2 diabetes mellitus with other specified complication: Secondary | ICD-10-CM | POA: Diagnosis not present

## 2021-05-21 DIAGNOSIS — M7918 Myalgia, other site: Secondary | ICD-10-CM | POA: Diagnosis present

## 2021-05-21 DIAGNOSIS — R5383 Other fatigue: Secondary | ICD-10-CM | POA: Diagnosis not present

## 2021-05-21 LAB — GLUCOSE, CAPILLARY: Glucose-Capillary: 121 mg/dL — ABNORMAL HIGH (ref 70–99)

## 2021-05-21 LAB — RESP PANEL BY RT-PCR (FLU A&B, COVID) ARPGX2
Influenza A by PCR: NEGATIVE
Influenza B by PCR: NEGATIVE
SARS Coronavirus 2 by RT PCR: NEGATIVE

## 2021-05-21 MED ORDER — MEGESTROL ACETATE 400 MG/10ML PO SUSP: 400.0000 mg | Freq: Every day | ORAL | 0 refills | Status: AC

## 2021-05-21 MED ORDER — DOCUSATE SODIUM 100 MG PO CAPS: 100.0000 mg | ORAL_CAPSULE | Freq: Two times a day (BID) | ORAL | 0 refills | Status: AC | PRN

## 2021-05-21 NOTE — Discharge Summary (Signed)
Physician Discharge Summary  [name] [id number] DOB: 07/10/1951 DOA: 05/17/2021  PCP: Biagio Borg, MD  Admit date: 05/17/2021 Discharge date: 05/21/2021  Admitted From: Home Disposition:  SNF  Recommendations for Outpatient Follow-up:  Follow up with PCP in 1 week Follow-up with outpatient neurology, referral in place  Discharge Condition: Stable CODE STATUS: Full  Diet recommendation:  Diet Orders (From admission, onward)     Start     Ordered   05/18/21 1255  Diet regular Room service appropriate? Yes; Fluid consistency: Thin  Diet effective now       Question Answer Comment  Room service appropriate? Yes   Fluid consistency: Thin      05/18/21 1254           Brief/Interim Summary: [name] is a 70 y.o. female with history of diabetes mellitus type 2, hypertension, chronic kidney disease stage III, hypothyroidism who was admitted last month for right femur fracture status post ORIF was brought to the ER after patient was finding it increasingly difficult to ambulate because of weakness.  Patient also has been a right thigh pain and has not moved her bowels for almost 5 days.  CT abdomen pelvis shows constipation.  X-ray of the right femur does not show any acute findings. Patient was admitted for generalized weakness.    Pt evaluated by Dr. [facility] in 07/2018 for gait and balance difficulty. At that time, it was noted she was having issues with gait and balance since 2016, worsened following back surgeries for spinal stenosis. Was rec for PT evaluation and pain management.    Pt evaluated by Neurology 04/2021 during hospitalization for right femur fracture.  At that time, husband had noted that patient had been weak at home with confusion, not acting herself for about a week with weight loss and depressed mood.  She was having falls at home which led to right femur fracture.  At that time, her confusion and weakness were thought to be driven by  fluctuating thyroid levels, possibly early stage of degenerative dementing process such as Alzheimer's, polypharmacy including gabapentin, tizanidine, narcotic use.  At that time, they recommended minimizing narcotics, decreasing or stopping gabapentin, decreasing or stopping Robaxin, avoid anticholinergic and other medications on the beers list, continue delirium precautions.  Recommended [facility] work-up as outpatient and was referred for outpatient neurology.  Discharge Diagnoses:  Principal Problem:   Generalized weakness Active Problems:   Macrocytic anemia   Hypertension associated with diabetes ([place])   Hypothyroidism   CKD (chronic kidney disease), stage III ([facility])   Cognitive disorder   Generalized weakness -Secondary to deconditioning -Case was discussed with neurology over the phone who reviewed the patient's chart.  CK remains in normal range which rules out myopathy.  Patient's previous neurological work-up for altered mental status revealed possible start of dementia.  Her lower extremity weakness may also be complicated by [id number] degenerative spinal stenosis, L5-S1 mild disc narrowing and bulging, which is present on CT lumbar spine in July 2022. No further work-up recommended.   -PT OT rec SNF placement   Cognitive impairment, likely dementia -Delirium precaution   Hypertension -Continue Norvasc, Catapres, HCTZ, Cozaar, Toprol   CAD -Continue Imdur   Hyperlipidemia -Continue Lipitor   Hypothyroidism -TSH high but free T4 normal  -Continue Synthroid   Diabetes mellitus -Continue Lantus, NovoLog sliding scale   Recent right femur fracture status post ORIF -Right femur xray: No change or unexpected finding. Healing fracture of the distal femur.  Depression -Continue Prozac  Constipation -Resolved, had a bowel movement yesterday  Chronic left lateral abdominal wall hernia -Has been stable since June 2019  Discharge Instructions  Discharge Instructions      Ambulatory referral to Neurology   Complete by: As directed    An appointment is requested in approximately: 2 weeks   Increase activity slowly   Complete by: As directed    No wound care   Complete by: As directed       Allergies as of 05/21/2021       Reactions   Ciprofloxacin Shortness Of Breath, Other (See Comments)   Dizziness   Hydrocodone Shortness Of Breath, Other (See Comments)   Tachycardia   Hydrocortisone    Tomato Other (See Comments)   Abdominal pain   Penicillins Hives   Pt just remembers hives, SHE HAS HAD AUGEMTIN WITHOUT ISSUES PER PATIENT no SOB or lightheadedness with hypotension: No Has patient had a PCN reaction causing severe rash involving mucus membranes or skin necrosis: No Has patient had a PCN reaction that required hospitalization No Has patient had a PCN reaction occurring within the last 10 years: No If all of the above answers are "NO", then may proceed with Cephalosporin use.        Medication List     STOP taking these medications    baclofen 10 MG tablet Commonly known as: LIORESAL   megestrol 40 MG tablet Commonly known as: MEGACE Replaced by: megestrol 400 MG/10ML suspension   traMADol 50 MG tablet Commonly known as: ULTRAM       TAKE these medications    amLODipine 10 MG tablet Commonly known as: NORVASC Take 1 tablet (10 mg total) by mouth daily.   aspirin 81 MG EC tablet Take 1 tablet (81 mg total) by mouth 2 (two) times daily. After one month,continue taking once a day   atorvastatin 40 MG tablet Commonly known as: LIPITOR TAKE ONE TABLET BY MOUTH EVERY NIGHT AT BEDTIME What changed: when to take this   Basaglar KwikPen 100 UNIT/ML Inject 30 Units into the skin daily.   cloNIDine 0.2 MG tablet Commonly known as: CATAPRES Take 1 tablet (0.2 mg total) by mouth 2 (two) times daily.   docusate sodium 100 MG capsule Commonly known as: COLACE Take 1 capsule (100 mg total) by mouth 2 (two) times daily as needed  for mild constipation.   Elmiron 100 MG capsule Generic drug: pentosan polysulfate Take 200 mg by mouth 2 (two) times daily.   fexofenadine 180 MG tablet Commonly known as: ALLEGRA Take 1 tablet (180 mg total) by mouth daily.   FLUoxetine 20 MG capsule Commonly known as: PROZAC TAKE ONE CAPSULE BY MOUTH DAILY What changed:  how much to take how to take this when to take this additional instructions   FreeStyle Libre 2 Reader [facility] Use as directed twice daily E11.9   FreeStyle Libre 2 Sensor Misc Use as directed once bi-weekly E11.9   glucose blood test strip Commonly known as: FREESTYLE TEST STRIPS Use as instructed to check blood sugar 4 times daily.   glucose blood test strip Use as instructed   insulin lispro 100 UNIT/ML injection Commonly known as: HUMALOG Inject 3-20 Units into the skin See admin instructions. Per sliding scale 3 times daily   isosorbide mononitrate 60 MG 24 hr tablet Commonly known as: IMDUR Take 1 tablet (60 mg total) by mouth daily.   levothyroxine 50 MCG tablet Commonly known as: SYNTHROID Take 1 tablet (50  mcg total) by mouth daily at 6 (six) AM.   linaclotide 72 MCG capsule Commonly known as: Linzess Take 1 capsule (72 mcg total) by mouth daily before breakfast. What changed:  when to take this reasons to take this   losartan-hydrochlorothiazide 100-25 MG tablet Commonly known as: HYZAAR Take 1 tablet by mouth daily.   Lotemax SM 0.38 % Gel Generic drug: Loteprednol Etabonate Place 1 drop into both eyes 3 (three) times daily.   megestrol 400 MG/10ML suspension Commonly known as: MEGACE Take 10 mLs (400 mg total) by mouth daily. Start taking on: May 22, 2021 Replaces: megestrol 40 MG tablet   metoprolol succinate 50 MG 24 hr tablet Commonly known as: TOPROL-XL TAKE 1 TABLET BY MOUTH DAILY WITH OR IMMEDIATELY FOLLOWING A MEAL What changed:  how much to take how to take this when to take this additional instructions    nitroGLYCERIN 0.4 MG SL tablet Commonly known as: NITROSTAT Place 1 tablet (0.4 mg total) under the tongue every 5 (five) minutes as needed for chest pain.   ondansetron 4 MG disintegrating tablet Commonly known as: Zofran ODT Take 1 tablet (4 mg total) by mouth every 8 (eight) hours as needed for up to 15 doses for nausea or vomiting.   pantoprazole 40 MG tablet Commonly known as: PROTONIX TAKE 1 TABLET(40 MG) BY MOUTH TWICE DAILY What changed:  how much to take how to take this when to take this   polyethylene glycol 17 g packet Commonly known as: MIRALAX / GLYCOLAX Take 17 g by mouth daily as needed. What changed: reasons to take this   Trulicity 1.5 0000000 Sopn Generic drug: Dulaglutide Inject 1.5 mg into the skin every Monday.   Vitamin D (Ergocalciferol) 1.25 MG (50000 UNIT) Caps capsule Commonly known as: DRISDOL Take 50,000 Units by mouth every 7 (seven) days.        Contact information for follow-up providers     Biagio Borg, MD. Schedule an appointment as soon as possible for a visit in 1 week(s).   Specialties: Internal Medicine, Radiology Contact information: [place] [address] [address]. Schedule an appointment as soon as possible for a visit in 2 week(s).   Contact information: [address]     [address] [place] [phone]             Contact information for after-discharge care     Destination     [facility] Preferred SNF .   Service: Skilled Nursing Contact information: [place] [address] [phone]                    Allergies  Allergen Reactions   Ciprofloxacin Shortness Of Breath and Other (See Comments)    Dizziness    Hydrocodone Shortness Of Breath and Other (See Comments)    Tachycardia   Hydrocortisone    Tomato Other (See Comments)    Abdominal pain    Penicillins Hives    Pt just remembers hives, SHE HAS HAD AUGEMTIN WITHOUT ISSUES PER PATIENT no SOB or lightheadedness with hypotension: No Has patient had a PCN reaction causing severe rash involving mucus membranes or skin necrosis: No Has patient had a PCN reaction that required hospitalization No Has patient had a PCN reaction occurring within the last 10 years: No If all of the above answers are "NO", then may proceed  with Cephalosporin use.     Procedures/Studies: CT ABDOMEN PELVIS WO CONTRAST  Result Date: 05/17/2021 CLINICAL DATA:  Right knee pain and constipation. Previous orthopedic surgery right femur. EXAM: CT ABDOMEN AND PELVIS WITHOUT CONTRAST TECHNIQUE: Multidetector CT imaging of the abdomen and pelvis was performed following the standard protocol without IV contrast. COMPARISON:  04/15/2021 FINDINGS: Lower chest: Lung bases are clear.  Spinal neurostimulator in place. Hepatobiliary: Liver parenchyma appears normal without contrast. Previous cholecystectomy. Pancreas: Normal Spleen: Normal Adrenals/Urinary Tract: Adrenal glands are normal. Kidneys are normal without contrast. Bladder is normal. Stomach/Bowel: Previous gastric surgery. No acute finding. Small bowel pattern is normal. Moderate amount of fecal matter in the colon that could go along with constipation, with a larger amount in the rectosigmoid region. No sign of diverticulosis or diverticulitis. Vascular/Lymphatic: Aortic atherosclerosis. No aneurysm. IVC is normal. No adenopathy. Reproductive: Previous hysterectomy.  No pelvic mass. Other: No free fluid or air. Left lateral abdominal wall hernia and left inguinal hernia containing fat as seen previously. Musculoskeletal: No acute bone finding. Previous lumbar region discectomy and fusion. IMPRESSION: Patient does have a large amount of fecal matter throughout the colon, particularly in the rectosigmoid region, which could go along with the clinical diagnosis of  constipation. No acute finding otherwise. Electronically Signed   By: Nelson Chimes M.D.   On: 05/17/2021 17:50   DG Femur Min 2 Views Right  Result Date: 05/17/2021 CLINICAL DATA:  Right knee pain surgery EXAM: RIGHT FEMUR 2 VIEWS COMPARISON:  04/16/2021 FINDINGS: Previous ORIF with lateral femoral plate and screws. Previous knee replacement. Fracture line of the distal femur remains visible but is not changed in position or alignment. No unexpected or acute finding. IMPRESSION: No change or unexpected finding. Healing fracture of the distal femur. Electronically Signed   By: Nelson Chimes M.D.   On: 05/17/2021 17:47       Discharge Exam: Vitals:   05/20/21 2136 05/21/21 0544  BP: 121/80 112/64  Pulse: 95 85  Resp: 18 18  Temp: 98.6 F (37 C) 99.2 F (37.3 C)  SpO2: 100% 100%     General: Pt is alert, awake, not in acute distress Cardiovascular: RRR, S1/S2 +, no edema Respiratory: CTA bilaterally, no wheezing, no rhonchi, no respiratory distress, no conversational dyspnea  Abdominal: Soft, NT, ND, bowel sounds + Extremities: no edema, no cyanosis Psych: Normal mood and affect, poor insight, memory loss     The results of significant diagnostics from this hospitalization (including imaging, microbiology, ancillary and laboratory) are listed below for reference.     Microbiology: Recent Results (from the past 240 hour(s))  Resp Panel by RT-PCR (Flu A&B, Covid) Nasopharyngeal Swab     Status: None   Collection Time: 05/17/21  6:37 PM   Specimen: Nasopharyngeal Swab; Nasopharyngeal(NP) swabs in vial transport medium  Result Value Ref Range Status   SARS Coronavirus 2 by RT PCR NEGATIVE NEGATIVE Final    Comment: (NOTE) SARS-CoV-2 target nucleic acids are NOT DETECTED.  The SARS-CoV-2 RNA is generally detectable in upper respiratory specimens during the acute phase of infection. The lowest concentration of SARS-CoV-2 viral copies this assay can detect is 138 copies/mL. A  negative result does not preclude SARS-Cov-2 infection and should not be used as the sole basis for treatment or other patient management decisions. A negative result may occur with  improper specimen collection/handling, submission of specimen other than nasopharyngeal swab, presence of viral mutation(s) within the areas targeted by this assay, and inadequate number of  viral copies(<138 copies/mL). A negative result must be combined with clinical observations, patient history, and epidemiological information. The expected result is Negative.  Fact Sheet for Patients:  EntrepreneurPulse.com.au  Fact Sheet for Healthcare Providers:  IncredibleEmployment.be  This test is no t yet approved or cleared by the [place] FDA and  has been authorized for detection and/or diagnosis of SARS-CoV-2 by FDA under an Emergency Use Authorization (EUA). This EUA will remain  in effect (meaning this test can be used) for the duration of the COVID-19 declaration under Section 564(b)(1) of the Act, 21 U.S.C.section 360bbb-3(b)(1), unless the authorization is terminated  or revoked sooner.       Influenza A by PCR NEGATIVE NEGATIVE Final   Influenza B by PCR NEGATIVE NEGATIVE Final    Comment: (NOTE) The Xpert Xpress SARS-CoV-2/FLU/RSV plus assay is intended as an aid in the diagnosis of influenza from Nasopharyngeal swab specimens and should not be used as a sole basis for treatment. Nasal washings and aspirates are unacceptable for Xpert Xpress SARS-CoV-2/FLU/RSV testing.  Fact Sheet for Patients: EntrepreneurPulse.com.au  Fact Sheet for Healthcare Providers: IncredibleEmployment.be  This test is not yet approved or cleared by the [place] FDA and has been authorized for detection and/or diagnosis of SARS-CoV-2 by FDA under an Emergency Use Authorization (EUA). This EUA will remain in effect (meaning this test can  be used) for the duration of the COVID-19 declaration under Section 564(b)(1) of the Act, 21 U.S.C. section 360bbb-3(b)(1), unless the authorization is terminated or revoked.  Performed at [facility], [address] [place], [place] [address]   Resp Panel by RT-PCR (Flu A&B, Covid) Nasopharyngeal Swab     Status: None   Collection Time: 05/21/21 11:04 AM   Specimen: Nasopharyngeal Swab; Nasopharyngeal(NP) swabs in vial transport medium  Result Value Ref Range Status   SARS Coronavirus 2 by RT PCR NEGATIVE NEGATIVE Final    Comment: (NOTE) SARS-CoV-2 target nucleic acids are NOT DETECTED.  The SARS-CoV-2 RNA is generally detectable in upper respiratory specimens during the acute phase of infection. The lowest concentration of SARS-CoV-2 viral copies this assay can detect is 138 copies/mL. A negative result does not preclude SARS-Cov-2 infection and should not be used as the sole basis for treatment or other patient management decisions. A negative result may occur with  improper specimen collection/handling, submission of specimen other than nasopharyngeal swab, presence of viral mutation(s) within the areas targeted by this assay, and inadequate number of viral copies(<138 copies/mL). A negative result must be combined with clinical observations, patient history, and epidemiological information. The expected result is Negative.  Fact Sheet for Patients:  EntrepreneurPulse.com.au  Fact Sheet for Healthcare Providers:  IncredibleEmployment.be  This test is no t yet approved or cleared by the [place] FDA and  has been authorized for detection and/or diagnosis of SARS-CoV-2 by FDA under an Emergency Use Authorization (EUA). This EUA will remain  in effect (meaning this test can be used) for the duration of the COVID-19 declaration under Section 564(b)(1) of the Act, 21 U.S.C.section 360bbb-3(b)(1), unless the authorization is  terminated  or revoked sooner.       Influenza A by PCR NEGATIVE NEGATIVE Final   Influenza B by PCR NEGATIVE NEGATIVE Final    Comment: (NOTE) The Xpert Xpress SARS-CoV-2/FLU/RSV plus assay is intended as an aid in the diagnosis of influenza from Nasopharyngeal swab specimens and should not be used as a sole basis for treatment. Nasal washings and aspirates are unacceptable  for Xpert Xpress SARS-CoV-2/FLU/RSV testing.  Fact Sheet for Patients: EntrepreneurPulse.com.au  Fact Sheet for Healthcare Providers: IncredibleEmployment.be  This test is not yet approved or cleared by the [place] FDA and has been authorized for detection and/or diagnosis of SARS-CoV-2 by FDA under an Emergency Use Authorization (EUA). This EUA will remain in effect (meaning this test can be used) for the duration of the COVID-19 declaration under Section 564(b)(1) of the Act, 21 U.S.C. section 360bbb-3(b)(1), unless the authorization is terminated or revoked.  Performed at [facility], [place] [address] [place], [place] [address]      Labs: BNP (last 3 results) No results for input(s): BNP in the last 8760 hours. Basic Metabolic Panel: Recent Labs  Lab 05/17/21 1653 05/17/21 1739 05/18/21 0449  NA 137 140 141  K 3.5 3.5 3.5  CL 113*  --  114*  CO2 14*  --  17*  GLUCOSE 159*  --  99  BUN 12  --  11  CREATININE 1.10*  --  0.94  CALCIUM 9.4  --  9.7   Liver Function Tests: Recent Labs  Lab 05/17/21 1653 05/18/21 0449  AST 18 16  ALT 13 14  ALKPHOS 76 75  BILITOT 0.6 0.9  PROT 6.7 6.7  ALBUMIN 3.3* 3.4*   Recent Labs  Lab 05/17/21 1653  LIPASE 31   No results for input(s): AMMONIA in the last 168 hours. CBC: Recent Labs  Lab 05/17/21 1653 05/17/21 1739 05/18/21 0449  WBC 7.0  --  9.0  NEUTROABS 4.2  --   --   HGB 10.2* 10.2* 10.1*  HCT 31.2* 30.0* 32.0*  MCV 104.0*  --  105.6*  PLT 270  --  304   Cardiac  Enzymes: Recent Labs  Lab 05/18/21 0449  CKTOTAL 90   BNP: Invalid input(s): POCBNP CBG: Recent Labs  Lab 05/20/21 0725 05/20/21 1135 05/20/21 1644 05/20/21 2141 05/21/21 0716  GLUCAP 109* 161* 163* 160* 121*   D-Dimer No results for input(s): DDIMER in the last 72 hours. Hgb A1c No results for input(s): HGBA1C in the last 72 hours. Lipid Profile No results for input(s): CHOL, HDL, LDLCALC, TRIG, CHOLHDL, LDLDIRECT in the last 72 hours. Thyroid function studies No results for input(s): TSH, T4TOTAL, T3FREE, THYROIDAB in the last 72 hours.  Invalid input(s): FREET3 Anemia work up No results for input(s): VITAMINB12, FOLATE, FERRITIN, TIBC, IRON, RETICCTPCT in the last 72 hours. Urinalysis    Component Value Date/Time   COLORURINE YELLOW 05/17/2021 2000   APPEARANCEUR CLOUDY (A) 05/17/2021 2000   LABSPEC 1.010 05/17/2021 2000   PHURINE 6.0 05/17/2021 2000   GLUCOSEU NEGATIVE 05/17/2021 2000   GLUCOSEU NEGATIVE 01/09/2021 1514   HGBUR NEGATIVE 05/17/2021 2000   BILIRUBINUR NEGATIVE 05/17/2021 2000   BILIRUBINUR negative 04/11/2018 1308   [place] 05/17/2021 2000   PROTEINUR NEGATIVE 05/17/2021 2000   UROBILINOGEN 1.0 01/09/2021 1514   NITRITE NEGATIVE 05/17/2021 2000   LEUKOCYTESUR SMALL (A) 05/17/2021 2000   Sepsis Labs Invalid input(s): PROCALCITONIN,  WBC,  LACTICIDVEN Microbiology Recent Results (from the past 240 hour(s))  Resp Panel by RT-PCR (Flu A&B, Covid) Nasopharyngeal Swab     Status: None   Collection Time: 05/17/21  6:37 PM   Specimen: Nasopharyngeal Swab; Nasopharyngeal(NP) swabs in vial transport medium  Result Value Ref Range Status   SARS Coronavirus 2 by RT PCR NEGATIVE NEGATIVE Final    Comment: (NOTE) SARS-CoV-2 target nucleic acids are NOT DETECTED.  The SARS-CoV-2 RNA is generally  detectable in upper respiratory specimens during the acute phase of infection. The lowest concentration of SARS-CoV-2 viral copies this assay can  detect is 138 copies/mL. A negative result does not preclude SARS-Cov-2 infection and should not be used as the sole basis for treatment or other patient management decisions. A negative result may occur with  improper specimen collection/handling, submission of specimen other than nasopharyngeal swab, presence of viral mutation(s) within the areas targeted by this assay, and inadequate number of viral copies(<138 copies/mL). A negative result must be combined with clinical observations, patient history, and epidemiological information. The expected result is Negative.  Fact Sheet for Patients:  EntrepreneurPulse.com.au  Fact Sheet for Healthcare Providers:  IncredibleEmployment.be  This test is no t yet approved or cleared by the [place] FDA and  has been authorized for detection and/or diagnosis of SARS-CoV-2 by FDA under an Emergency Use Authorization (EUA). This EUA will remain  in effect (meaning this test can be used) for the duration of the COVID-19 declaration under Section 564(b)(1) of the Act, 21 U.S.C.section 360bbb-3(b)(1), unless the authorization is terminated  or revoked sooner.       Influenza A by PCR NEGATIVE NEGATIVE Final   Influenza B by PCR NEGATIVE NEGATIVE Final    Comment: (NOTE) The Xpert Xpress SARS-CoV-2/FLU/RSV plus assay is intended as an aid in the diagnosis of influenza from Nasopharyngeal swab specimens and should not be used as a sole basis for treatment. Nasal washings and aspirates are unacceptable for Xpert Xpress SARS-CoV-2/FLU/RSV testing.  Fact Sheet for Patients: EntrepreneurPulse.com.au  Fact Sheet for Healthcare Providers: IncredibleEmployment.be  This test is not yet approved or cleared by the [place] FDA and has been authorized for detection and/or diagnosis of SARS-CoV-2 by FDA under an Emergency Use Authorization (EUA). This EUA will remain in  effect (meaning this test can be used) for the duration of the COVID-19 declaration under Section 564(b)(1) of the Act, 21 U.S.C. section 360bbb-3(b)(1), unless the authorization is terminated or revoked.  Performed at [facility], [address] [place], [place] [address]   Resp Panel by RT-PCR (Flu A&B, Covid) Nasopharyngeal Swab     Status: None   Collection Time: 05/21/21 11:04 AM   Specimen: Nasopharyngeal Swab; Nasopharyngeal(NP) swabs in vial transport medium  Result Value Ref Range Status   SARS Coronavirus 2 by RT PCR NEGATIVE NEGATIVE Final    Comment: (NOTE) SARS-CoV-2 target nucleic acids are NOT DETECTED.  The SARS-CoV-2 RNA is generally detectable in upper respiratory specimens during the acute phase of infection. The lowest concentration of SARS-CoV-2 viral copies this assay can detect is 138 copies/mL. A negative result does not preclude SARS-Cov-2 infection and should not be used as the sole basis for treatment or other patient management decisions. A negative result may occur with  improper specimen collection/handling, submission of specimen other than nasopharyngeal swab, presence of viral mutation(s) within the areas targeted by this assay, and inadequate number of viral copies(<138 copies/mL). A negative result must be combined with clinical observations, patient history, and epidemiological information. The expected result is Negative.  Fact Sheet for Patients:  EntrepreneurPulse.com.au  Fact Sheet for Healthcare Providers:  [employer]  This test is no t yet approved or cleared by the [place] FDA and  has been authorized for detection and/or diagnosis of SARS-CoV-2 by FDA under an Emergency Use Authorization (EUA). This EUA will remain  in effect (meaning this test can be used) for the duration of the COVID-19 declaration under  Section 564(b)(1) of the Act, 21 U.S.C.section  360bbb-3(b)(1), unless the authorization is terminated  or revoked sooner.       Influenza A by PCR NEGATIVE NEGATIVE Final   Influenza B by PCR NEGATIVE NEGATIVE Final    Comment: (NOTE) The Xpert Xpress SARS-CoV-2/FLU/RSV plus assay is intended as an aid in the diagnosis of influenza from Nasopharyngeal swab specimens and should not be used as a sole basis for treatment. Nasal washings and aspirates are unacceptable for Xpert Xpress SARS-CoV-2/FLU/RSV testing.  Fact Sheet for Patients: EntrepreneurPulse.com.au  Fact Sheet for Healthcare Providers: IncredibleEmployment[employer]be  This test is not yet approved or cleared by the [place] FDA and has been authorized for detection and/or diagnosis of SARS-CoV-2 by FDA under an Emergency Use Authorization (EUA). This EUA will remain in effect (meaning this test can be used) for the duration of the COVID-19 declaration under Section 564(b)(1) of the Act, 21 U.S.C. section 360bbb-3(b)(1), unless the authorization is terminated or revoked.  Performed at [facility], [place] [address] [place], [place] [address]      Patient was seen and examined on the day of discharge and was found to be in stable condition. Time coordinating discharge: 40 minutes including assessment and coordination of care, as well as examination of the patient.   SIGNED:  Dessa Phi, DO [facility] 05/21/2021, 12:34 PM

## 2021-05-21 NOTE — TOC Transition Note (Signed)
Transition of Care [facility]) - CM/SW Discharge Note   Patient Details  Name: [name] MRN: [id number] Date of Birth: October 17, 1950  Transition of Care [facility]) CM/SW Contact:  Lynnell Catalan, RN Phone Number: 05/21/2021, 1:04 PM   Clinical Narrative:    Pt to dc today to [facility]. [facility] contacted for transport. RN to call report to [phone].     Barriers to Discharge: Continued Medical Work up     Discharge Plan and Services In-house Referral: Clinical Social Work              Readmission Risk Interventions Readmission Risk Prevention Plan 04/24/2021  Transportation Screening Complete  PCP or Specialist appointment within 3-5 days of discharge Complete  HRI or [place] [facility] Complete  SW Recovery Care/Counseling Consult Complete  [place] Not Applicable  Some recent data might be hidden

## 2021-05-21 NOTE — Progress Notes (Signed)
Report called to Caren Griffins at [facility], paperwork sent w [facility]. D/C in stable condition w all belongings.

## 2021-05-21 NOTE — Progress Notes (Signed)
Physical Therapy Treatment Patient Details Name: [name] MRN: [id number] DOB: 03-01-1951 Today's Date: 05/21/2021    History of Present Illness 70 y.o. female was brought to the ER after patient was finding it increasingly difficult to ambulate because of weakness.  Patient also has been a right thigh pain and has not moved her bowels for almost 5 days.  Pt with history of diabetes mellitus type 2, hypertension, chronic kidney disease stage III, hypothyroidism who was admitted last month for right femur fracture status post ORIF on 04/16/21.    PT Comments    Pt requires max A +2 for supine<>sit transfer, heavy cues for hand palcement and sequencing, occasional initiation and multimodal cues for correct placement, requires increased time. Once seated EOB pt with poor trunk control, prefers posterior lean or posterior/R lateral trunk lean requiring min A to maintain midline. Pt cued for LAQ, able to perform bilaterally but lacks motor control despite cues. Pt tolerate sitting EOB for ~5 minutes, attempted to prop on BUE but too fatigued and poor trunk control so assisted back to supine. Pt unable to perform ankle pumps despite multimodal cues. Pt's daughter present initially during session, interacting with pt and occasionally answer same questions multiple times. Pt supine in bed with all needs in reach at EOS.    Follow Up Recommendations  SNF;Supervision/Assistance - 24 hour;Supervision for mobility/OOB     Equipment Recommendations  None recommended by PT    Recommendations for Other Services       Precautions / Restrictions Precautions Precautions: Fall Restrictions Weight Bearing Restrictions: No    Mobility  Bed Mobility Overal bed mobility: Needs Assistance Bed Mobility: Supine to Sit;Sit to Supine  Supine to sit: Max assist;+2 for physical assistance Sit to supine: Max assist;+2 for physical assistance   General bed mobility comments: pt inches bil towards EOB,  requires blocking of one leg to advance other or legs adduct together, pt reaches across body for bedrail when cued, VCs for sequencing and hand placement throughout transfer, max A+2 to mobilize BLE to EOB and upright trunk ultimately; max A +2 to return to supine with pt attempting to position trunk into L sidelying and minimal kicking of BLE to raise into bed    Transfers  General transfer comment: unable, mechanical lift recommended  Ambulation/Gait        Stairs             Wheelchair Mobility    Modified Rankin (Stroke Patients Only)       Balance Overall balance assessment: Needs assistance Sitting-balance support: Bilateral upper extremity supported;Feet supported Sitting balance-Leahy Scale: Poor Sitting balance - Comments: min A for sitting balance 2* posterior lean/R lateral lean Postural control: Posterior lean;Right lateral lean       Cognition Arousal/Alertness: Awake/alert Behavior During Therapy: Flat affect Overall Cognitive Status: Within Functional Limits for tasks assessed  General Comments: pt's daughter im room communicating with pt and daughter doesn't appear to be alarmed by pt's repeated questions or slow responses to therapist; pt requires increased time with 1 step commands and occasional repeating of VCs      Exercises General Exercises - Lower Extremity Ankle Circles/Pumps:  (Passively to neutral, active unable to cue) Long Arc Quad: Seated;AROM;Strengthening;Both;10 reps (lacking control, assit to maintain trunk in sitting)    General Comments        Pertinent Vitals/Pain Pain Assessment: No/denies pain    Home Living  Prior Function            PT Goals (current goals can now be found in the care plan section) Acute Rehab PT Goals Patient Stated Goal: to be more independent PT Goal Formulation: With patient Time For Goal Achievement: 06/01/21 Potential to Achieve Goals: Fair Progress towards PT  goals: Progressing toward goals    Frequency    Min 2X/week      PT Plan      Co-evaluation              AM-PAC PT "6 Clicks" Mobility   Outcome Measure  Help needed turning from your back to your side while in a flat bed without using bedrails?: Total Help needed moving from lying on your back to sitting on the side of a flat bed without using bedrails?: Total Help needed moving to and from a bed to a chair (including a wheelchair)?: Total Help needed standing up from a chair using your arms (e.g., wheelchair or bedside chair)?: Total Help needed to walk in hospital room?: Total Help needed climbing 3-5 steps with a railing? : Total 6 Click Score: 6    End of Session   Activity Tolerance: Patient tolerated treatment well Patient left: in bed;with call bell/phone within reach;with bed alarm set Nurse Communication: Mobility status;Need for lift equipment PT Visit Diagnosis: Difficulty in walking, not elsewhere classified (R26.2);Other abnormalities of gait and mobility (R26.89);Muscle weakness (generalized) (M62.81)     Time: [id number] PT Time Calculation (min) (ACUTE ONLY): 17 min  Charges:  $Therapeutic Activity: 8-22 mins                      Tori Jaystin Mcgarvey PT, DPT 05/21/21, 10:54 AM

## 2021-05-21 NOTE — Consult Note (Signed)
[facility] CM Inpatient Consult   05/21/2021  [name] Jun 04, 1951 [id number]  [place]  Accountable Care Organization [ACO] Patient: [name]   Patient evaluated for community based chronic complex disease management services with [place] [facility]. Per review, current recommendation and planning is for SNF level of care.  No THN CM needs.  Netta Cedars, MSN, [facility] Liaison Nurse Mobile Phone [phone]  Toll free office [phone]

## 2021-05-22 ENCOUNTER — Other Ambulatory Visit: Payer: Self-pay | Admitting: *Deleted

## 2021-05-22 DIAGNOSIS — R531 Weakness: Secondary | ICD-10-CM | POA: Diagnosis not present

## 2021-05-22 DIAGNOSIS — R5381 Other malaise: Secondary | ICD-10-CM | POA: Diagnosis not present

## 2021-05-22 DIAGNOSIS — E119 Type 2 diabetes mellitus without complications: Secondary | ICD-10-CM | POA: Diagnosis not present

## 2021-05-22 DIAGNOSIS — R4189 Other symptoms and signs involving cognitive functions and awareness: Secondary | ICD-10-CM | POA: Diagnosis not present

## 2021-05-22 NOTE — Patient Outreach (Signed)
Verified in [place] (Patient [name]) [name] admitted to [facility] on 05/21/21.   Communication sent to [facility] SW to make aware writer is following for transition plans.    [name] appears to be active with [facility] embedded team RNCM at PCP practice. Communication sent to [place] to make aware writer is following while in SNF.    [name], MSN, RN,BSN [place] Acute Care Coordinator [phone] [facility]) [phone]  (Toll free office)

## 2021-05-24 ENCOUNTER — Ambulatory Visit (INDEPENDENT_AMBULATORY_CARE_PROVIDER_SITE_OTHER): Payer: Medicare Other | Admitting: *Deleted

## 2021-05-24 DIAGNOSIS — E1165 Type 2 diabetes mellitus with hyperglycemia: Secondary | ICD-10-CM

## 2021-05-24 DIAGNOSIS — I152 Hypertension secondary to endocrine disorders: Secondary | ICD-10-CM | POA: Diagnosis not present

## 2021-05-24 DIAGNOSIS — E1159 Type 2 diabetes mellitus with other circulatory complications: Secondary | ICD-10-CM | POA: Diagnosis not present

## 2021-05-24 NOTE — Chronic Care Management (AMB) (Signed)
Chronic Care Management   CCM RN Visit Note  05/24/2021 Name: MARCHELLE Dyer MRN: [id number] DOB: 1951/01/05  Subjective: [name] is a 70 y.o. year old female who is a primary care patient of Biagio Borg, MD. The care management team was consulted for assistance with disease management and care coordination needs.    Engaged with patient's caregiver/ daughter [name], on [facility] DPR by telephone for follow up visit in response to provider referral for case management and/or care coordination services.   Consent to Services:  The patient was given information about Chronic Care Management services, agreed to services, and gave verbal consent prior to initiation of services.  Please see initial visit note for detailed documentation.  Patient agreed to services and verbal consent obtained.   Assessment: Review of patient past medical history, allergies, medications, health status, including review of consultants reports, laboratory and other test data, was performed as part of comprehensive evaluation and provision of chronic care management services.   CCM Care Plan Allergies  Allergen Reactions   Ciprofloxacin Shortness Of Breath and Other (See Comments)    Dizziness    Hydrocodone Shortness Of Breath and Other (See Comments)    Tachycardia   Hydrocortisone    Tomato Other (See Comments)    Abdominal pain   Penicillins Hives    Pt just remembers hives, SHE HAS HAD AUGEMTIN WITHOUT ISSUES PER PATIENT no SOB or lightheadedness with hypotension: No Has patient had a PCN reaction causing severe rash involving mucus membranes or skin necrosis: No Has patient had a PCN reaction that required hospitalization No Has patient had a PCN reaction occurring within the last 10 years: No If all of the above answers are "NO", then may proceed with Cephalosporin use.    Outpatient Encounter Medications as of 05/24/2021  Medication Sig Note   amLODipine (NORVASC) 10 MG tablet Take  1 tablet (10 mg total) by mouth daily.    aspirin 81 MG EC tablet Take 1 tablet (81 mg total) by mouth 2 (two) times daily. After one month,continue taking once a day    atorvastatin (LIPITOR) 40 MG tablet TAKE ONE TABLET BY MOUTH EVERY NIGHT AT BEDTIME (Patient taking differently: Take 40 mg by mouth daily.)    cloNIDine (CATAPRES) 0.2 MG tablet Take 1 tablet (0.2 mg total) by mouth 2 (two) times daily.    Continuous Blood Gluc Receiver (FREESTYLE LIBRE 2 READER) DEVI Use as directed twice daily E11.9    Continuous Blood Gluc Sensor (FREESTYLE LIBRE 2 SENSOR) MISC Use as directed once bi-weekly E11.9    docusate sodium (COLACE) 100 MG capsule Take 1 capsule (100 mg total) by mouth 2 (two) times daily as needed for mild constipation.    Dulaglutide (TRULICITY) 1.5 0000000 SOPN Inject 1.5 mg into the skin every Monday.    ELMIRON 100 MG capsule Take 200 mg by mouth 2 (two) times daily.  05/15/2014: .   fexofenadine (ALLEGRA) 180 MG tablet Take 1 tablet (180 mg total) by mouth daily.    FLUoxetine (PROZAC) 20 MG capsule TAKE ONE CAPSULE BY MOUTH DAILY (Patient taking differently: Take 20 mg by mouth daily.)    glucose blood (FREESTYLE TEST STRIPS) test strip Use as instructed to check blood sugar 4 times daily.    glucose blood test strip Use as instructed    Insulin Glargine (BASAGLAR KWIKPEN) 100 UNIT/ML Inject 30 Units into the skin daily.    insulin lispro (HUMALOG) 100 UNIT/ML injection Inject 3-20 Units  into the skin See admin instructions. Per sliding scale 3 times daily    isosorbide mononitrate (IMDUR) 60 MG 24 hr tablet Take 1 tablet (60 mg total) by mouth daily.    levothyroxine (SYNTHROID) 50 MCG tablet Take 1 tablet (50 mcg total) by mouth daily at 6 (six) AM.    linaclotide (LINZESS) 72 MCG capsule Take 1 capsule (72 mcg total) by mouth daily before breakfast.    losartan-hydrochlorothiazide (HYZAAR) 100-25 MG tablet Take 1 tablet by mouth daily. 05/18/2021: New as of 05-14-21    Loteprednol Etabonate (LOTEMAX SM) 0.38 % GEL Place 1 drop into both eyes 3 (three) times daily.    megestrol (MEGACE) 400 MG/10ML suspension Take 10 mLs (400 mg total) by mouth daily.    metoprolol succinate (TOPROL-XL) 50 MG 24 hr tablet TAKE 1 TABLET BY MOUTH DAILY WITH OR IMMEDIATELY FOLLOWING A MEAL (Patient taking differently: Take 50 mg by mouth daily.)    nitroGLYCERIN (NITROSTAT) 0.4 MG SL tablet Place 1 tablet (0.4 mg total) under the tongue every 5 (five) minutes as needed for chest pain.    ondansetron (ZOFRAN ODT) 4 MG disintegrating tablet Take 1 tablet (4 mg total) by mouth every 8 (eight) hours as needed for up to 15 doses for nausea or vomiting.    pantoprazole (PROTONIX) 40 MG tablet TAKE 1 TABLET(40 MG) BY MOUTH TWICE DAILY (Patient taking differently: Take 40 mg by mouth 2 (two) times daily. TAKE 1 TABLET(40 MG) BY MOUTH TWICE DAILY)    polyethylene glycol (MIRALAX / GLYCOLAX) 17 g packet Take 17 g by mouth daily as needed.    Vitamin D, Ergocalciferol, (DRISDOL) 1.25 MG (50000 UNIT) CAPS capsule Take 50,000 Units by mouth every 7 (seven) days.    [DISCONTINUED] enoxaparin (LOVENOX) 40 MG/0.4ML injection Inject 0.4 mLs (40 mg total) into the skin daily.    No facility-administered encounter medications on file as of 05/24/2021.    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   Acute diverticulitis 03/21/2021   Degenerative disc disease, cervical 02/08/2021   Type 2 diabetes mellitus ([place]) 02/02/2021   Acquired hypothyroidism 11/24/2020   Chronic pain 11/24/2020   Severe sepsis ([place]) 11/09/2020   Hyperlipidemia associated with type 2 diabetes mellitus ([place]) 99991111   Acute metabolic encephalopathy 99991111   Acute lower UTI 11/09/2020    Recurrent falls 10/19/2020   Gait disorder 10/11/2020   Fall 10/11/2020   Low grade fever 02/04/2020   Chest pain 01/26/2020   Lumbar post-laminectomy syndrome 10/05/2019   Spinal cord stimulator status 10/05/2019   Body mass index (BMI) 40.0-44.9, adult ([place]) 10/05/2019   Rectal bleeding 09/01/2019   History of peptic ulcer disease 09/01/2019   Screening for viral disease 09/01/2019   Insulin dependent type 2 diabetes mellitus ([place]) 09/01/2019   Pyelonephritis 06/05/2019   Recurrent UTI 06/05/2019   Urinary incontinence 06/05/2019   Nausea & vomiting 02/08/2019   Abdominal pain 02/08/2019   Constipation 02/08/2019   UTI (urinary tract infection) 02/08/2019   LUQ pain 01/21/2019   Cervical radiculopathy at C6 01/28/2018   Left lateral epicondylitis 01/23/2018   Obesity 01/23/2018   Weakness 05/01/2017   Hyperkalemia 05/01/2017   Acute kidney injury superimposed on CKD ([place]) 05/01/2017   Essential (primary) hypertension 05/01/2017   Sepsis ([place])  05/01/2017   Dysuria 04/23/2017   Right leg swelling 12/27/2016   Leg pain, lateral, right 11/05/2016   Radiculopathy 04/03/2016   Hypersomnolence 11/23/2015   Right sided abdominal pain 07/25/2015   Malaise and fatigue 11/05/2014   Rotator cuff syndrome 07/26/2014   Fracture of fifth metacarpal bone of right hand 07/08/2014   Right cervical radiculopathy 06/27/2014   Chronic interstitial cystitis 05/23/2014   Osteoarthritis of CMC joint of thumb 04/11/2014   De Quervain's tenosynovitis, left 03/14/2014   CKD (chronic kidney disease), stage III ([place]) 03/03/2014   Lower back pain 03/03/2014   Ingrown nail 03/03/2014   Peripheral edema 02/23/2014   Lateral epicondylitis of right elbow 01/31/2014   Neck pain on left side 11/19/2013   Abdominal discomfort 10/12/2012   Right lumbar radiculopathy 05/11/2012   Peripheral neuropathy 05/11/2012   Vertigo 03/29/2012   Headache(784.0) 03/10/2012   Left lower quadrant abdominal pain  10/24/2011   Lumbar radiculopathy 10/24/2011   Hypothyroidism 08/19/2011   Right leg weakness 04/04/2011   Hematochezia 01/07/2011   Preventative health care 01/07/2011   CHOLELITHIASIS 05/04/2009   GERD 04/26/2009   Gastroparesis 04/26/2009   ULCER-GASTRIC 03/15/2009   DIVERTICULOSIS-COLON 03/15/2009   Cervical radiculopathy 10/28/2008   MENOPAUSAL DISORDER 06/29/2008   Pain in joint, shoulder region 10/12/2007   INSOMNIA-SLEEP DISORDER-UNSPEC 09/01/2007   Depression 09/01/2007   Macrocytic anemia 08/13/2007   COMMON MIGRAINE 08/13/2007   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   Diabetes ([place]) 05/06/2007   HLD (hyperlipidemia) 05/06/2007   ANXIETY 05/06/2007   Hypertension associated with diabetes ([place]) 05/06/2007   Coronary atherosclerosis 05/06/2007   Conditions to be addressed/monitored:  HTN and DMII  Care Plan : General Plan of Care (Adult)  Updates made by Knox Royalty, RN since 05/24/2021 12:00 AM     Problem: Health Promotion or Disease Self-Management (General Plan of Care)   Priority: High     Goal: Self-Management Plan Developed   Start Date: 05/04/2021  Expected End Date: 06/04/2021  Recent Progress: On track  Priority: High  Note:   Current Barriers:  Ineffective Self Health Maintenance in a patient with self-health management post-recent femur surgery Transportation, Inability to perform ADL's/ iADL's independently post- recent femur surgery: was independent prior to surgery, per caregiver/ husband now requires total care at home and is bedridden Refused SNF rehabilitation placement at time of recent hospitalization July 9-20, 2022 for femur surgery  Clinical Goal(s):  05/04/21:  Over the next 30 days, caregiver and patient will work with care management team to address care coordination needs related to post-operative recovery at home as evidenced by caregiver/ patient reporting during CCM RN CM outreach of: 05/24/21: Goal completed: patient re-hospitalized  and has discharged to SNF/ rehab center ([place]) Active engagement with/ participation in home health team services Engagement with [place] for resources for transportation to provider appointments and in-home care assistance Interventions:  Evaluation of current treatment plan related to HTN, DMII, and recent femur surgery, self-management and patient's adherence to plan as established by provider. Collaboration with Biagio Borg, MD regarding development and update of comprehensive plan of care as evidenced by provider attestation       and co-signature Inter-disciplinary care team collaboration (see longitudinal plan of care) Reviewed recent hospitalization August 11-15, 2022 and discharge to SNF/ rehab center notes Care Coordination outreach (previously scheduled follow up call): attempted calls to both numbers listed in EHR: spoke with patient's daughter/ caregiver [name], on [facility] DPR; confirmed that patient  is currently residing at [facility] SNF/ Rehab center: daughter reports "care and staff is great here;" discussed with daughter that CCM team would be following patient and would pick up once SNF/ Rehab discharge disposition is decided.  [place] reports that patient may end up discharging from SNF/ rehab center to her home, instead of her home with her spouse- stated that patient's spouse does not want a hospital bed in his home, and reports she already has a hospital bed ready for use.  Encouraged caregiver to maintain regular and consistent communication with SNF/ Rehab team for needs at discharge, and she verbalizes understanding/ agreement.  Appreciative of call today. Discussed plans with patient for ongoing care management follow up and provided patient with direct contact information for care management team Self Care Activities:  Caregiver verbalizes understanding of plan to actively participate in home health services and to engage with [place] to determine resources for transportation and for in-home care assistance Calls pharmacy for medication refills Calls provider office for new concerns or questions Patient Goals: I have asked the [place] to contact you to provide community resources for transportation to provider appointments and for in-home care assistance: please listen out for a phone call from the care guide and contact these resources promptly once they are provided Work with the home health team to determine what [place]'s needs are for home equipment: the home health team will be able to communicate with [place] doctors to request equipment or needs Begin a list or a notebook of services in my neighborhood or community Follow-up on any referrals for help I am given Think ahead to make sure my need does not become an emergency Develop a back-up plan  Follow Up Plan:  The patient has been provided with contact information for the care management team and has been advised to call with any health related questions or concerns     Plan: The patient has been provided with contact information for the care management team and has been advised to call with any health related questions or concerns Will collaborate as indicated with [facility] RN CM PAC for discharge disposition from SNF/ Rehabilitation center  [facility], RN, BSN, [place] [phone]: direct office [phone]: mobile

## 2021-05-24 NOTE — Patient Instructions (Signed)
Visit Information  PATIENT GOALS:  Goals Addressed             This Visit's Progress    COMPLETED: Find Help in My Community   On track    Timeframe:  Short-Term Goal Priority:  High Start Date:       05/04/21                      Expected End Date:      06/04/21                 Follow Up Date 05/24/2021- Goal completed today; patient experienced hospital re-admission with subsequent discharge to SNF/ rehabilitation facility    I have asked the [place] to contact you to provide community resources for transportation to provider appointments and for in-home care assistance: please listen out for a phone call from the care guide and contact these resources promptly once they are provided Work with the home health team to determine what [place]'s needs are for home equipment: the home health team will be able to communicate with [place] doctors to request equipment or needs Begin a list or a notebook of services in my neighborhood or community Follow-up on any referrals for help I am given Think ahead to make sure my need does not become an emergency Develop a back-up plan    Why is this important?   Knowing how and where to find help for yourself or family in your neighborhood and community is an important skill.  You will want to take some steps to learn how.         The patient verbalized understanding of instructions, educational materials, and care plan provided today and declined offer to receive copy of patient instructions, educational materials, and care plan.  The patient has been provided with contact information for the care management team and has been advised to call with any health related questions or concerns.   Oneta Rack, RN, BSN, [facility] RN Care Coordination- [place] [phone]: direct office [phone]: mobile

## 2021-05-30 ENCOUNTER — Telehealth: Payer: Self-pay | Admitting: Internal Medicine

## 2021-05-30 DIAGNOSIS — M79641 Pain in right hand: Secondary | ICD-10-CM

## 2021-05-30 DIAGNOSIS — M5416 Radiculopathy, lumbar region: Secondary | ICD-10-CM

## 2021-05-30 DIAGNOSIS — F09 Unspecified mental disorder due to known physiological condition: Secondary | ICD-10-CM

## 2021-05-30 DIAGNOSIS — R269 Unspecified abnormalities of gait and mobility: Secondary | ICD-10-CM

## 2021-05-30 NOTE — Telephone Encounter (Signed)
There may be an attending already at the [facility], so not sure I should  be sending orders  If pt has an attending or provider there, perhaps the request should go to that provider

## 2021-05-30 NOTE — Telephone Encounter (Signed)
   Daughter calling to request order for patient to use Tiger Balm Icy Hot Back stimulator while at [facility]  Requesting order be given to [employer] [phone] Alyse Low at [facility]

## 2021-05-30 NOTE — Telephone Encounter (Signed)
   Daughter also would like referral to Neuro; hand pain  Daughter would like to know what patient should be taking for back pain. Was Tramadol discontinued   Please call

## 2021-05-31 DIAGNOSIS — R4189 Other symptoms and signs involving cognitive functions and awareness: Secondary | ICD-10-CM | POA: Diagnosis not present

## 2021-05-31 DIAGNOSIS — R531 Weakness: Secondary | ICD-10-CM | POA: Diagnosis not present

## 2021-05-31 DIAGNOSIS — S72491D Other fracture of lower end of right femur, subsequent encounter for closed fracture with routine healing: Secondary | ICD-10-CM | POA: Diagnosis not present

## 2021-05-31 DIAGNOSIS — R5381 Other malaise: Secondary | ICD-10-CM | POA: Diagnosis not present

## 2021-05-31 NOTE — Telephone Encounter (Signed)
Pt is able to see provider at [facility], however would like for Dr. Jenny Reichmann to still put put in the [facility] referral for Dr. Posey Pronto  Please call daughter when this has been done

## 2021-06-01 ENCOUNTER — Ambulatory Visit: Payer: Medicare Other | Admitting: Internal Medicine

## 2021-06-01 NOTE — Telephone Encounter (Signed)
Ok neuro referral is done

## 2021-06-04 NOTE — Telephone Encounter (Signed)
Pt's daughter has been informed that referral was entered.

## 2021-06-06 DIAGNOSIS — E11649 Type 2 diabetes mellitus with hypoglycemia without coma: Secondary | ICD-10-CM | POA: Diagnosis not present

## 2021-06-07 ENCOUNTER — Encounter: Payer: Self-pay | Admitting: Neurology

## 2021-06-12 ENCOUNTER — Emergency Department (HOSPITAL_COMMUNITY)
Admission: EM | Admit: 2021-06-12 | Discharge: 2021-06-13 | Disposition: A | Discharge status: Home / Self Care | Payer: Medicare Other | Attending: Emergency Medicine | Admitting: Emergency Medicine

## 2021-06-12 ENCOUNTER — Other Ambulatory Visit: Payer: Self-pay

## 2021-06-12 ENCOUNTER — Other Ambulatory Visit: Payer: Self-pay | Admitting: *Deleted

## 2021-06-12 ENCOUNTER — Encounter (HOSPITAL_COMMUNITY): Payer: Self-pay | Admitting: Emergency Medicine

## 2021-06-12 DIAGNOSIS — Z20822 Contact with and (suspected) exposure to covid-19: Secondary | ICD-10-CM | POA: Insufficient documentation

## 2021-06-12 DIAGNOSIS — Z7982 Long term (current) use of aspirin: Secondary | ICD-10-CM | POA: Diagnosis not present

## 2021-06-12 DIAGNOSIS — Z79899 Other long term (current) drug therapy: Secondary | ICD-10-CM | POA: Insufficient documentation

## 2021-06-12 DIAGNOSIS — M791 Myalgia, unspecified site: Secondary | ICD-10-CM | POA: Diagnosis not present

## 2021-06-12 DIAGNOSIS — D649 Anemia, unspecified: Secondary | ICD-10-CM | POA: Diagnosis not present

## 2021-06-12 DIAGNOSIS — N183 Chronic kidney disease, stage 3 unspecified: Secondary | ICD-10-CM | POA: Insufficient documentation

## 2021-06-12 DIAGNOSIS — I129 Hypertensive chronic kidney disease with stage 1 through stage 4 chronic kidney disease, or unspecified chronic kidney disease: Secondary | ICD-10-CM | POA: Insufficient documentation

## 2021-06-12 DIAGNOSIS — N179 Acute kidney failure, unspecified: Secondary | ICD-10-CM

## 2021-06-12 DIAGNOSIS — E039 Hypothyroidism, unspecified: Secondary | ICD-10-CM | POA: Insufficient documentation

## 2021-06-12 DIAGNOSIS — Z87891 Personal history of nicotine dependence: Secondary | ICD-10-CM | POA: Diagnosis not present

## 2021-06-12 DIAGNOSIS — I10 Essential (primary) hypertension: Secondary | ICD-10-CM | POA: Diagnosis not present

## 2021-06-12 DIAGNOSIS — E860 Dehydration: Secondary | ICD-10-CM | POA: Diagnosis not present

## 2021-06-12 DIAGNOSIS — Z794 Long term (current) use of insulin: Secondary | ICD-10-CM | POA: Diagnosis not present

## 2021-06-12 DIAGNOSIS — E1122 Type 2 diabetes mellitus with diabetic chronic kidney disease: Secondary | ICD-10-CM | POA: Insufficient documentation

## 2021-06-12 DIAGNOSIS — M7918 Myalgia, other site: Secondary | ICD-10-CM | POA: Diagnosis present

## 2021-06-12 LAB — CBC WITH DIFFERENTIAL/PLATELET
Abs Immature Granulocytes: 0.05 10*3/uL (ref 0.00–0.07)
Basophils Absolute: 0 10*3/uL (ref 0.0–0.1)
Basophils Relative: 1 %
Eosinophils Absolute: 0.2 10*3/uL (ref 0.0–0.5)
Eosinophils Relative: 2 %
HCT: 29.1 % — ABNORMAL LOW (ref 36.0–46.0)
Hemoglobin: 9 g/dL — ABNORMAL LOW (ref 12.0–15.0)
Immature Granulocytes: 1 %
Lymphocytes Relative: 30 %
Lymphs Abs: 2.5 10*3/uL (ref 0.7–4.0)
MCH: 34.1 pg — ABNORMAL HIGH (ref 26.0–34.0)
MCHC: 30.9 g/dL (ref 30.0–36.0)
MCV: 110.2 fL — ABNORMAL HIGH (ref 80.0–100.0)
Monocytes Absolute: 0.4 10*3/uL (ref 0.1–1.0)
Monocytes Relative: 5 %
Neutro Abs: 5.2 10*3/uL (ref 1.7–7.7)
Neutrophils Relative %: 61 %
Platelets: 233 10*3/uL (ref 150–400)
RBC: 2.64 MIL/uL — ABNORMAL LOW (ref 3.87–5.11)
RDW: 13.8 % (ref 11.5–15.5)
WBC: 8.5 10*3/uL (ref 4.0–10.5)
nRBC: 0 % (ref 0.0–0.2)

## 2021-06-12 LAB — COMPREHENSIVE METABOLIC PANEL
ALT: 13 U/L (ref 0–44)
AST: 19 U/L (ref 15–41)
Albumin: 3.2 g/dL — ABNORMAL LOW (ref 3.5–5.0)
Alkaline Phosphatase: 51 U/L (ref 38–126)
Anion gap: 9 (ref 5–15)
BUN: 34 mg/dL — ABNORMAL HIGH (ref 8–23)
CO2: 17 mmol/L — ABNORMAL LOW (ref 22–32)
Calcium: 9.6 mg/dL (ref 8.9–10.3)
Chloride: 112 mmol/L — ABNORMAL HIGH (ref 98–111)
Creatinine, Ser: 1.82 mg/dL — ABNORMAL HIGH (ref 0.44–1.00)
GFR, Estimated: 29 mL/min — ABNORMAL LOW (ref >60)
Glucose, Bld: 216 mg/dL — ABNORMAL HIGH (ref 70–99)
Potassium: 6 mmol/L — ABNORMAL HIGH (ref 3.5–5.1)
Sodium: 138 mmol/L (ref 135–145)
Total Bilirubin: 1 mg/dL (ref 0.3–1.2)
Total Protein: 6.6 g/dL (ref 6.5–8.1)

## 2021-06-12 LAB — URINALYSIS, ROUTINE W REFLEX MICROSCOPIC
Bilirubin Urine: NEGATIVE
Glucose, UA: NEGATIVE mg/dL
Hgb urine dipstick: NEGATIVE
Ketones, ur: NEGATIVE mg/dL
Nitrite: NEGATIVE
Protein, ur: NEGATIVE mg/dL
Specific Gravity, Urine: 1.01 (ref 1.005–1.030)
pH: 6 (ref 5.0–8.0)

## 2021-06-12 LAB — URINALYSIS, MICROSCOPIC (REFLEX)

## 2021-06-12 LAB — CK: Total CK: 76 U/L (ref 38–234)

## 2021-06-12 LAB — RESP PANEL BY RT-PCR (FLU A&B, COVID) ARPGX2
Influenza A by PCR: NEGATIVE
Influenza B by PCR: NEGATIVE
SARS Coronavirus 2 by RT PCR: NEGATIVE

## 2021-06-12 MED ORDER — TRAMADOL HCL 50 MG PO TABS: 50.0000 mg | ORAL_TABLET | Freq: Four times a day (QID) | ORAL | 0 refills | Status: DC | PRN

## 2021-06-12 MED ORDER — SODIUM CHLORIDE 0.9 % IV BOLUS
500.0000 mL | Freq: Once | INTRAVENOUS | Status: AC
  Administered 2021-06-12: 500 mL via INTRAVENOUS

## 2021-06-12 MED ORDER — HYDROCODONE-ACETAMINOPHEN 5-325 MG PO TABS
1.0000 | ORAL_TABLET | Freq: Once | ORAL | Status: DC
  Filled 2021-06-12: qty 1

## 2021-06-12 MED ORDER — ACETAMINOPHEN 500 MG PO TABS
1000.0000 mg | ORAL_TABLET | Freq: Once | ORAL | Status: AC
  Administered 2021-06-12: 1000 mg via ORAL
  Filled 2021-06-12: qty 2

## 2021-06-12 MED ORDER — TRAMADOL HCL 50 MG PO TABS
50.0000 mg | ORAL_TABLET | Freq: Once | ORAL | Status: AC
  Administered 2021-06-12: 50 mg via ORAL
  Filled 2021-06-12: qty 1

## 2021-06-12 NOTE — ED Provider Notes (Signed)
[facility] DEPT Provider Note   CSN: [id number] Arrival date & time: 06/12/21  1800     History Chief Complaint  Patient presents with   Generalized Body Aches    [name] is a 70 y.o. female.  Pt presents to the ED today with generalized body aches.  Sx started around 1300.  No trauma.  No n/v/d.  No fevers.    Pt's daughter said pt's spouse is turning up the heat to 80 degrees in pt's room.  He is not making her drink fluids or do PT.  He will bring food from outside that she's not supposed to have.  She said pt will cooperate with what she is supposed to do when pt's kids and grandkids are visiting.        Past Medical History:  Diagnosis Date   Anxiety    Arthritis    Blood dyscrasia    "FREE BLEEDER"   CERVICAL RADICULOPATHY, LEFT    Chronic back pain    CKD (chronic kidney disease)    DR. SANFORD  [facility]   COMMON MIGRAINE    CORONARY ARTERY DISEASE    Cough    CURRENT COLD   Cystitis    DEPRESSION    Diabetes mellitus, type II ([place])    DIVERTICULOSIS-COLON    Dysrhythmia    palpitations   Gastric ulcer 04/2008   Gastroparesis    GERD (gastroesophageal reflux disease)    Hiatal hernia    Hyperlipidemia    Hypertension    Hypothyroidism    INSOMNIA-SLEEP DISORDER-UNSPEC    Iron deficiency anemia    Wears glasses     Patient Active Problem List   Diagnosis Date Noted   Cognitive disorder 05/21/2021   Generalized weakness 05/17/2021   Femur fracture ([place]) 05/01/2021   Bilateral hand pain 05/01/2021   Weight loss 04/15/2021   Leukocytosis 04/15/2021   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   Acute diverticulitis 03/21/2021   Degenerative disc disease, cervical 02/08/2021   Type 2 diabetes mellitus ([place]) 02/02/2021   Acquired hypothyroidism 11/24/2020   Chronic pain 11/24/2020   Severe sepsis ([place]) 11/09/2020   Hyperlipidemia associated with type 2 diabetes mellitus ([place]) 99991111   Acute metabolic  encephalopathy 99991111   Acute lower UTI 11/09/2020   Recurrent falls 10/19/2020   Gait disorder 10/11/2020   Fall 10/11/2020   Low grade fever 02/04/2020   Chest pain 01/26/2020   Lumbar post-laminectomy syndrome 10/05/2019   Spinal cord stimulator status 10/05/2019   Body mass index (BMI) 40.0-44.9, adult ([place]) 10/05/2019   Rectal bleeding 09/01/2019   History of peptic ulcer disease 09/01/2019   Screening for viral disease 09/01/2019   Insulin dependent type 2 diabetes mellitus ([place]) 09/01/2019   Pyelonephritis 06/05/2019   Recurrent UTI 06/05/2019   Urinary incontinence 06/05/2019   Nausea & vomiting 02/08/2019   Abdominal pain 02/08/2019   Constipation 02/08/2019   UTI (urinary tract infection) 02/08/2019   LUQ pain 01/21/2019   Cervical radiculopathy at C6 01/28/2018   Left lateral epicondylitis 01/23/2018   Obesity 01/23/2018   Weakness 05/01/2017   Hyperkalemia 05/01/2017   Acute kidney injury superimposed on CKD ([place]) 05/01/2017   Essential (primary) hypertension 05/01/2017   Sepsis ([place]) 05/01/2017   Dysuria 04/23/2017   Right leg swelling 12/27/2016   Leg pain, lateral, right 11/05/2016   Radiculopathy 04/03/2016   Hypersomnolence 11/23/2015   Right sided abdominal pain 07/25/2015   Malaise and fatigue 11/05/2014   Rotator cuff syndrome  07/26/2014   Fracture of fifth metacarpal bone of right hand 07/08/2014   Right cervical radiculopathy 06/27/2014   Chronic interstitial cystitis 05/23/2014   Osteoarthritis of Goltry joint of thumb 04/11/2014   De Quervain's tenosynovitis, left 03/14/2014   CKD (chronic kidney disease), stage III ([place]) 03/03/2014   Lower back pain 03/03/2014   Ingrown nail 03/03/2014   Peripheral edema 02/23/2014   Lateral epicondylitis of right elbow 01/31/2014   Neck pain on left side 11/19/2013   Abdominal discomfort 10/12/2012   Right lumbar radiculopathy 05/11/2012   Peripheral neuropathy 05/11/2012   Vertigo 03/29/2012    Headache(784.0) 03/10/2012   Left lower quadrant abdominal pain 10/24/2011   Lumbar radiculopathy 10/24/2011   Hypothyroidism 08/19/2011   Right leg weakness 04/04/2011   Hematochezia 01/07/2011   Preventative health care 01/07/2011   CHOLELITHIASIS 05/04/2009   GERD 04/26/2009   Gastroparesis 04/26/2009   ULCER-GASTRIC 03/15/2009   DIVERTICULOSIS-COLON 03/15/2009   Cervical radiculopathy 10/28/2008   MENOPAUSAL DISORDER 06/29/2008   Pain in joint, shoulder region 10/12/2007   INSOMNIA-SLEEP DISORDER-UNSPEC 09/01/2007   Depression 09/01/2007   Macrocytic anemia 08/13/2007   COMMON MIGRAINE 08/13/2007   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   Diabetes ([place]) 05/06/2007   HLD (hyperlipidemia) 05/06/2007   ANXIETY 05/06/2007   Hypertension associated with diabetes ([place]) 05/06/2007   Coronary atherosclerosis 05/06/2007    Past Surgical History:  Procedure Laterality Date   ABDOMINAL HYSTERECTOMY     ANTERIOR LAT LUMBAR FUSION Left 08/13/2017   Procedure: LEFT SIDED LUMBAR 3-4 LATERAL INTERBODY FUSION WITH INSTRUMENTATION AND ALLOGRAFT;  Surgeon: Phylliss Bob, MD;  Location: [facility];  Service: Orthopedics;  Laterality: Left;  LEFT SIDED LUMBAR 3-4 LATERAL INTERBODY FUSION WITH INSTRUMENTATION AND ALLOGRAFT; REQUEST 3 HOURS   Back Stimulator  07/2018   BACK SURGERY  03/2016   BREAST EXCISIONAL BIOPSY Right    40 years ago   CHOLECYSTECTOMY  06/2009   COLONOSCOPY     ESOPHAGOGASTRODUODENOSCOPY     EYE SURGERY Bilateral    lasik   Gastric Wedge resection lipoma  11/2007   x2 with laparotomy and gastrostomy   JOINT REPLACEMENT     Left knee replacement     ORIF FEMUR FRACTURE Right 04/16/2021   Procedure: OPEN REDUCTION INTERNAL FIXATION (ORIF) DISTAL FEMUR FRACTURE;  Surgeon: Shona Needles, MD;  Location: [facility];  Service: Orthopedics;  Laterality: Right;   Rigth knee replacement with revision  04/2008   Dr. Berenice Primas   ROTATOR CUFF REPAIR Left 01/2009   s/p bladder surgury  09/2009    Dr. Terance Hart   SPINAL CORD STIMULATOR INSERTION N/A 09/11/2018   Procedure: LUMBAR SPINAL CORD STIMULATOR INSERTION;  Surgeon: Clydell Hakim, MD;  Location: [facility];  Service: Neurosurgery;  Laterality: N/A;  LUMBAR SPINAL CORD STIMULATOR INSERTION     OB History     Gravida  3   Para  3   Term      Preterm      AB      Living  3      SAB      IAB      Ectopic      Multiple      Live Births              Family History  Problem Relation Age of Onset   Diabetes Sister        x 3   Heart disease Sister        x2   Breast cancer  Sister 40   Diabetes Brother        x3   Heart disease Brother        x2   Coronary artery disease Other        female 1st degree   Hypertension Other    Breast cancer Sister 45   Breast cancer Other 25   Diabetes Brother    Colon cancer Neg Hx     Social History   Tobacco Use   Smoking status: Former   Smokeless tobacco: Never   Tobacco comments:    quit 30 years ago  Vaping Use   Vaping Use: Never used  Substance Use Topics   Alcohol use: No    Alcohol/week: 0.0 standard drinks   Drug use: No    Home Medications Prior to Admission medications   Medication Sig Start Date End Date Taking? Authorizing Provider  amLODipine (NORVASC) 10 MG tablet Take 1 tablet (10 mg total) by mouth daily. 04/25/21   Shelly Coss, MD  aspirin 81 MG EC tablet Take 1 tablet (81 mg total) by mouth 2 (two) times daily. After one month,continue taking once a day 04/24/21   Shelly Coss, MD  atorvastatin (LIPITOR) 40 MG tablet TAKE ONE TABLET BY MOUTH EVERY NIGHT AT BEDTIME Patient taking differently: Take 40 mg by mouth daily. 06/13/20   Biagio Borg, MD  cloNIDine (CATAPRES) 0.2 MG tablet Take 1 tablet (0.2 mg total) by mouth 2 (two) times daily. 04/24/21   Shelly Coss, MD  Continuous Blood Gluc Receiver (FREESTYLE LIBRE 2 READER) DEVI Use as directed twice daily E11.9 10/11/20   Biagio Borg, MD  Continuous Blood Gluc Sensor (FREESTYLE  LIBRE 2 SENSOR) MISC Use as directed once bi-weekly E11.9 10/11/20   Biagio Borg, MD  docusate sodium (COLACE) 100 MG capsule Take 1 capsule (100 mg total) by mouth 2 (two) times daily as needed for mild constipation. 05/21/21   Dessa Phi, DO  Dulaglutide (TRULICITY) 1.5 0000000 SOPN Inject 1.5 mg into the skin every Monday. 02/02/21   [provider]  ELMIRON 100 MG capsule Take 200 mg by mouth 2 (two) times daily.  07/20/13   [provider]  fexofenadine (ALLEGRA) 180 MG tablet Take 1 tablet (180 mg total) by mouth daily. 05/01/21 05/01/22  Biagio Borg, MD  FLUoxetine (PROZAC) 20 MG capsule TAKE ONE CAPSULE BY MOUTH DAILY Patient taking differently: Take 20 mg by mouth daily. 11/13/20   Biagio Borg, MD  glucose blood (FREESTYLE TEST STRIPS) test strip Use as instructed to check blood sugar 4 times daily. 04/20/18   Elayne Snare, MD  glucose blood test strip Use as instructed 12/21/20   Elayne Snare, MD  Insulin Glargine [facility]) 100 UNIT/ML Inject 30 Units into the skin daily. 02/02/21   [provider]  insulin lispro (HUMALOG) 100 UNIT/ML injection Inject 3-20 Units into the skin See admin instructions. Per sliding scale 3 times daily    [provider]  isosorbide mononitrate (IMDUR) 60 MG 24 hr tablet Take 1 tablet (60 mg total) by mouth daily. 02/21/20   Biagio Borg, MD  levothyroxine (SYNTHROID) 50 MCG tablet Take 1 tablet (50 mcg total) by mouth daily at 6 (six) AM. 04/25/21   Shelly Coss, MD  linaclotide [facility]) 72 MCG capsule Take 1 capsule (72 mcg total) by mouth daily before breakfast. 11/13/20   Biagio Borg, MD  losartan-hydrochlorothiazide (HYZAAR) 100-25 MG tablet Take 1 tablet by mouth daily. 05/14/21  [provider]  Loteprednol Etabonate (LOTEMAX SM) 0.38 % GEL Place 1 drop into both eyes 3 (three) times daily. 11/29/20   [provider]  megestrol (MEGACE) 400 MG/10ML suspension Take 10 mLs (400 mg total) by  mouth daily. 05/22/21   Dessa Phi, DO  metoprolol succinate (TOPROL-XL) 50 MG 24 hr tablet TAKE 1 TABLET BY MOUTH DAILY WITH OR IMMEDIATELY FOLLOWING A MEAL Patient taking differently: Take 50 mg by mouth daily. 03/29/21   Biagio Borg, MD  nitroGLYCERIN (NITROSTAT) 0.4 MG SL tablet Place 1 tablet (0.4 mg total) under the tongue every 5 (five) minutes as needed for chest pain. 01/27/20   Ghimire, Henreitta Leber, MD  ondansetron (ZOFRAN ODT) 4 MG disintegrating tablet Take 1 tablet (4 mg total) by mouth every 8 (eight) hours as needed for up to 15 doses for nausea or vomiting. 03/21/21   [name], MD  pantoprazole (PROTONIX) 40 MG tablet TAKE 1 TABLET(40 MG) BY MOUTH TWICE DAILY Patient taking differently: Take 40 mg by mouth 2 (two) times daily. TAKE 1 TABLET(40 MG) BY MOUTH TWICE DAILY 11/13/20   Biagio Borg, MD  polyethylene glycol (MIRALAX / GLYCOLAX) 17 g packet Take 17 g by mouth daily as needed. 01/27/20   Ghimire, Henreitta Leber, MD  Vitamin D, Ergocalciferol, (DRISDOL) 1.25 MG (50000 UNIT) CAPS capsule Take 50,000 Units by mouth every 7 (seven) days.    [provider]  enoxaparin (LOVENOX) 40 MG/0.4ML injection Inject 0.4 mLs (40 mg total) into the skin daily. 04/18/21 04/24/21  Delray Alt, PA-C    Allergies    Ciprofloxacin, Hydrocodone, Hydrocortisone, Tomato, and Penicillins  Review of Systems   Review of Systems  Musculoskeletal:  Positive for myalgias.  Neurological:  Positive for weakness.  All other systems reviewed and are negative.  Physical Exam Updated Vital Signs BP 129/65   Pulse 78   Temp 97.6 F (36.4 C) (Oral)   Resp 18   Ht '5\' 5"'$  (1.651 m)   Wt 119 kg   SpO2 100%   BMI 43.66 kg/m   Physical Exam Vitals and nursing note reviewed.  Constitutional:      Appearance: Normal appearance.  HENT:     Head: Normocephalic and atraumatic.     Right Ear: External ear normal.     Left Ear: External ear normal.     Nose: Nose normal.     Mouth/Throat:      Mouth: Mucous membranes are moist.     Pharynx: Oropharynx is clear.  Eyes:     Extraocular Movements: Extraocular movements intact.     Conjunctiva/sclera: Conjunctivae normal.     Pupils: Pupils are equal, round, and reactive to light.  Cardiovascular:     Rate and Rhythm: Normal rate and regular rhythm.     Pulses: Normal pulses.     Heart sounds: Normal heart sounds.  Pulmonary:     Effort: Pulmonary effort is normal.     Breath sounds: Normal breath sounds.  Abdominal:     General: Abdomen is flat. Bowel sounds are normal.     Palpations: Abdomen is soft.  Musculoskeletal:     Cervical back: Normal range of motion and neck supple.     Comments: Arthritis in hands  Skin:    General: Skin is warm.     Capillary Refill: Capillary refill takes less than 2 seconds.  Neurological:     General: No focal deficit present.     Mental Status: She  is alert and oriented to person, place, and time.  Psychiatric:        Mood and Affect: Mood normal.        Behavior: Behavior normal.    ED Results / Procedures / Treatments   Labs (all labs ordered are listed, but only abnormal results are displayed) Labs Reviewed  COMPREHENSIVE METABOLIC PANEL - Abnormal; Notable for the following components:      Result Value   Potassium 6.0 (*)    Chloride 112 (*)    CO2 17 (*)    Glucose, Bld 216 (*)    BUN 34 (*)    Creatinine, Ser 1.82 (*)    Albumin 3.2 (*)    GFR, Estimated 29 (*)    All other components within normal limits  CBC WITH DIFFERENTIAL/PLATELET - Abnormal; Notable for the following components:   RBC 2.64 (*)    Hemoglobin 9.0 (*)    HCT 29.1 (*)    MCV 110.2 (*)    MCH 34.1 (*)    All other components within normal limits  URINALYSIS, ROUTINE W REFLEX MICROSCOPIC - Abnormal; Notable for the following components:   APPearance HAZY (*)    Leukocytes,Ua SMALL (*)    All other components within normal limits  URINALYSIS, MICROSCOPIC (REFLEX) - Abnormal; Notable for the  following components:   Bacteria, UA MANY (*)    All other components within normal limits  RESP PANEL BY RT-PCR (FLU A&B, COVID) ARPGX2  CK  BASIC METABOLIC PANEL    EKG EKG Interpretation  Date/Time:  Tuesday June 12 2021 18:16:50 EDT Ventricular Rate:  74 PR Interval:  163 QRS Duration: 92 QT Interval:  411 QTC Calculation: 456 R Axis:   26 Text Interpretation: Sinus rhythm Borderline T abnormalities, anterior leads No significant change since last tracing Confirmed by Isla Pence [phone]) on 06/12/2021 6:32:07 PM  Radiology No results found.  Procedures Procedures   Medications Ordered in ED Medications  HYDROcodone-acetaminophen (NORCO/VICODIN) 5-325 MG per tablet 1 tablet (has no administration in time range)  sodium chloride 0.9 % bolus 500 mL (0 mLs Intravenous Stopped 06/12/21 2328)  acetaminophen (TYLENOL) tablet 1,000 mg (1,000 mg Oral Given 06/12/21 1831)    ED Course  I have reviewed the triage vital signs and the nursing notes.  Pertinent labs & imaging results that were available during my care of the patient were reviewed by me and considered in my medical decision making (see chart for details).    MDM Rules/Calculators/A&P                           Pt does have a mild aki.  K is elevated and the bmp is repeated.  UA nl.  If K has improved, pt can go home.  Pt signed out with Dr. Leonette Monarch at shift change. Final Clinical Impression(s) / ED Diagnoses Final diagnoses:  Dehydration  AKI (acute kidney injury) ([place])  Chronic anemia  Myalgia    Rx / DC Orders ED Discharge Orders     None        Isla Pence, MD 06/13/21 1747

## 2021-06-12 NOTE — ED Triage Notes (Signed)
Pt arrived BIB [facility] from [facility]. Pt has been complaining of generalized body aches today since 1pm. Pt denies N/V/D or fevers. Pt denies falling.

## 2021-06-12 NOTE — ED Notes (Signed)
Patients husband would like a call back : [name] [phone]

## 2021-06-12 NOTE — Patient Outreach (Signed)
[facility] Post- Acute Care Coordinator follow up. Per [place] (Patient [name]) member resides in [facility].  Communication sent to [facility] SW to inquire about transition plans.   Will continue to follow.     [name], MSN, RN,BSN [place] Acute Care Coordinator [phone] [facility]) [phone]  (Toll free office)

## 2021-06-13 DIAGNOSIS — E119 Type 2 diabetes mellitus without complications: Secondary | ICD-10-CM | POA: Diagnosis not present

## 2021-06-13 DIAGNOSIS — N179 Acute kidney failure, unspecified: Secondary | ICD-10-CM | POA: Diagnosis not present

## 2021-06-13 DIAGNOSIS — E1122 Type 2 diabetes mellitus with diabetic chronic kidney disease: Secondary | ICD-10-CM | POA: Diagnosis not present

## 2021-06-13 DIAGNOSIS — Z794 Long term (current) use of insulin: Secondary | ICD-10-CM | POA: Diagnosis not present

## 2021-06-13 DIAGNOSIS — I129 Hypertensive chronic kidney disease with stage 1 through stage 4 chronic kidney disease, or unspecified chronic kidney disease: Secondary | ICD-10-CM | POA: Diagnosis not present

## 2021-06-13 LAB — BASIC METABOLIC PANEL
Anion gap: 10 (ref 5–15)
BUN: 37 mg/dL — ABNORMAL HIGH (ref 8–23)
CO2: 18 mmol/L — ABNORMAL LOW (ref 22–32)
Calcium: 9.5 mg/dL (ref 8.9–10.3)
Chloride: 107 mmol/L (ref 98–111)
Creatinine, Ser: 1.6 mg/dL — ABNORMAL HIGH (ref 0.44–1.00)
GFR, Estimated: 34 mL/min — ABNORMAL LOW (ref >60)
Glucose, Bld: 197 mg/dL — ABNORMAL HIGH (ref 70–99)
Potassium: 4 mmol/L (ref 3.5–5.1)
Sodium: 135 mmol/L (ref 135–145)

## 2021-06-13 NOTE — ED Notes (Signed)
Attempted to call [facility], no answer

## 2021-06-13 NOTE — ED Notes (Signed)
[facility] contacted for transportation home

## 2021-06-13 NOTE — ED Provider Notes (Signed)
I assumed care of this patient.  Please see previous provider note for further details of Hx, PE.  Briefly patient is a 70 y.o. female who presented with generalized myalgias.  Blood work notable for mild AKI as well as hyperkalemia likely from hemolysis pending a repeat BMP to reassess potassium.  Repeat potassium normal. Plan to DC.  The patient appears reasonably screened and/or stabilized for discharge and I doubt any other medical condition or other [facility] requiring further screening, evaluation, or treatment in the ED at this time prior to discharge. Safe for discharge with strict return precautions.  Disposition: Discharge  Condition: Good  I have discussed the results, Dx and Tx plan with the patient/family who expressed understanding and agree(s) with the plan. Discharge instructions discussed at length. The patient/family was given strict return precautions who verbalized understanding of the instructions. No further questions at time of discharge.    ED Discharge Orders          Ordered    traMADol (ULTRAM) 50 MG tablet  Every 6 hours PRN        06/12/21 2344             Follow Up: Biagio Borg, MD [place] [address] [phone]            Fatima Blank, MD 06/13/21 [phone]

## 2021-06-14 ENCOUNTER — Other Ambulatory Visit: Payer: Self-pay | Admitting: *Deleted

## 2021-06-14 ENCOUNTER — Telehealth: Payer: Self-pay | Admitting: Internal Medicine

## 2021-06-14 DIAGNOSIS — R531 Weakness: Secondary | ICD-10-CM

## 2021-06-14 DIAGNOSIS — I10 Essential (primary) hypertension: Secondary | ICD-10-CM

## 2021-06-14 DIAGNOSIS — R269 Unspecified abnormalities of gait and mobility: Secondary | ICD-10-CM

## 2021-06-14 DIAGNOSIS — R6251 Failure to thrive (child): Secondary | ICD-10-CM

## 2021-06-14 DIAGNOSIS — F09 Unspecified mental disorder due to known physiological condition: Secondary | ICD-10-CM

## 2021-06-14 DIAGNOSIS — K5792 Diverticulitis of intestine, part unspecified, without perforation or abscess without bleeding: Secondary | ICD-10-CM

## 2021-06-14 NOTE — Patient Outreach (Signed)
[place] Coordinator follow up. Spoke with [facility] SNF SW who indicates [name] will transition to home with daughter. [facility] will be arranged for PT/OT/RN/SW/aide. [name] is slated to transition to daughter's home on 06/15/21.  SNF SW also reports daughter [name] has inquired about ramp and incontinent supplies.  Telephone call made to [place] (dtr/DPR) [phone]. Patient identifiers confirmed. [name] endorses she is interested in [name] CM office ordering incontinent supplies pending cost. Writer will call office to inquire about pricing.   [name] also states that PTAR told her that insurance would not cover transportation from SNF. Therefore, she states she is renting a Printmaker and a temporary ramp for [name] to get into her home.   [name] had to cut the conversation short. She was in her doctor's office appointment. Asked Probation officer to contact her again in an hour or so.   In the meantime, writer will contact [place] Management office to inquire about pricing of incontinent supplies. Will let [name] know once information received. Will make referral to [place] regarding ramp resources and information. Will also alert [place] Coordinator of member's transition to home on tomorrow with daughter.   Will contact [name] (daughter) back later today.    [name], MSN, RN,BSN [place] Acute Care Coordinator [phone] [facility]) [phone]  (Toll free office)

## 2021-06-14 NOTE — Telephone Encounter (Signed)
Ok this is done 

## 2021-06-14 NOTE — Patient Outreach (Signed)
[facility] Post- Acute Care Coordinator follow up.  Spoke with [employer] (daughter/DPR) [phone]. Made [name] aware of the pricing for adult XL diapers that [place] Management office can order for her. [name] states she will purchase incontinent cream and wipes from the store. Agreeable to diapers being ordered by [place] Management main office. Made [name] aware that diapers will likely be in by Monday.   Discussed temporary ramp. [name] states she was awaiting a return call from the ramp agency. Writer contacted company [employer], [place] [phone]. [name] was able to speak with Aaron Edelman on the speaker phone. [name] made arrangements to pick up temporary ramp tomorrow morning at 0845. She reserved ramp for 1 week.   [name] wants Probation officer to make referral to [facility] care guide for assistance for possible assistance for longer term ramp options. Also referral for transportation assistance to MD appointments. [name] has MD appointments next week 06/19/21 and 06/28/2021. She is wheelchair bound and requires handicap accessible transportation.  Referral made to [facility] care guide. Communication sent to [place] Management main office to order XL adult diapers. Communication sent to [place] RN care coordinator to make aware of above notes.    Sherry Rolling, MSN, RN,BSN [place] Acute Care Coordinator [phone] [facility]) [phone]  (Toll free office)

## 2021-06-14 NOTE — Telephone Encounter (Signed)
I think that is why she was at the facility to have PT and OT, so not sure why she would need further at [facility] we would need to see some kind of recommendation that this is still needed from the facility  Also not sure what "PCA" is  -  personal care assistant?

## 2021-06-14 NOTE — Telephone Encounter (Signed)
    Daughter calling to report  patient is going to be discharged from [facility] on 06/15/21  Seeking order for PT/OT/ PCA

## 2021-06-15 ENCOUNTER — Telehealth: Payer: Self-pay | Admitting: *Deleted

## 2021-06-15 NOTE — Chronic Care Management (AMB) (Signed)
  Care Management   Note  06/15/2021 Name: [name] MRN: [id number] DOB: 06-03-1951  [name] is a 70 y.o. year old female who is a primary care patient of Biagio Borg, MD and is actively engaged with the care management team. I reached out to [name] by phone today to assist with re-scheduling an initial visit with the RN Case Manager  Follow up plan: Telephone appointment with care management team member scheduled for:07/03/21  [place], [place] Management  Direct Dial: [phone]

## 2021-06-15 NOTE — Telephone Encounter (Signed)
   Telephone encounter was:  Successful.  06/15/2021 Name: PAHAL BONIFIELD MRN: [id number] DOB: 11-08-1950  KEYIRA STAHL is a 70 y.o. year old female who is a primary care patient of Jenny Reichmann, Hunt Oris, MD . The community resource team was consulted for assistance with Caregiver Stress  Care guide performed the following interventions: Patient provided with information about care guide support team and interviewed to confirm resource needs.  Follow Up Plan:  Care guide will follow up with patient by phone over the next     Instructed patient to call back at [phone]  at their earliest convenience.   [place], Care Management  [phone] [address] , [place] [address] Email : [email or web address]

## 2021-06-18 ENCOUNTER — Telehealth: Payer: Self-pay | Admitting: *Deleted

## 2021-06-18 ENCOUNTER — Telehealth: Payer: Self-pay | Admitting: Internal Medicine

## 2021-06-18 NOTE — Telephone Encounter (Signed)
   [name] DOB: 1951/03/19 MRN: [id number]   RIDER WAIVER AND RELEASE OF LIABILITY  For purposes of improving physical access to our facilities, [place] is pleased to partner with third parties to provide [facility] patients or other authorized individuals the option of convenient, on-demand ground transportation services (the Technical brewer") through use of the technology service that enables users to request on-demand ground transportation from independent third-party providers.  By opting to use and accept these [employer], I, the undersigned, hereby agree on behalf of myself, and on behalf of any minor child using the Government social research officer for whom I am the parent or legal guardian, as follows:  Government social research officer provided to me are provided by independent third-party transportation providers who are not [employer] or employees and who are unaffiliated with [employer]. [place] is neither a transportation carrier nor a common or public carrier. [facility] has no control over the quality or safety of the transportation that occurs as a result of the [employer]. [facility] cannot guarantee that any third-party transportation provider will complete any arranged transportation service. [facility] makes no representation, warranty, or guarantee regarding the reliability, timeliness, quality, safety, suitability, or availability of any of the Transport Services or that they will be error free. I fully understand that traveling by vehicle involves risks and dangers of serious bodily injury, including permanent disability, paralysis, and death. I agree, on behalf of myself and on behalf of any minor child using the Transport Services for whom I am the parent or legal guardian, that the entire risk arising out of my use of the [employer] remains solely with me, to the maximum extent permitted under applicable law. The [employer] are provided "as  is" and "as available." [facility] disclaims all representations and warranties, express, implied or statutory, not expressly set out in these terms, including the implied warranties of merchantability and fitness for a particular purpose. I hereby waive and release [facility], its agents, employees, officers, directors, representatives, insurers, attorneys, assigns, successors, subsidiaries, and affiliates from any and all past, present, or future claims, demands, liabilities, actions, causes of action, or suits of any kind directly or indirectly arising from acceptance and use of the [employer]. I further waive and release [facility] and its affiliates from all present and future liability and responsibility for any injury or death to persons or damages to property caused by or related to the use of the [employer]. I have read this Waiver and Release of Liability, and I understand the terms used in it and their legal significance. This Waiver is freely and voluntarily given with the understanding that my right (as well as the right of any minor child for whom I am the parent or legal guardian using the [employer]) to legal recourse against [facility] in connection with the [employer] is knowingly surrendered in return for use of these services.   I attest that I read the consent document to [name], gave Ms. Hickson the opportunity to ask questions and answered the questions asked (if any). I affirm that [name] then provided consent for she's participation in this program.     [name] , daughter [name] Daughter, Care giver consented on her behalf

## 2021-06-18 NOTE — Telephone Encounter (Signed)
   Telephone encounter was:  Successful.  06/18/2021 Name: [name] MRN: [id number] DOB: 1951-06-19  [name] is a 70 y.o. year old female who is a primary care patient of Jenny Reichmann, Hunt Oris, MD . The community resource team was consulted for assistance with Transportation Needs Daughter is [facility] POA and she is arranging transportaion , no insurance provided transportation so putting in for [facility] transportaion for 2 upcoming appts asreferal requested   Care guide performed the following interventions: Patient provided with information about care guide support team and interviewed to confirm resource needs Follow up call placed to community resources to determine status of patients referral.  Follow Up Plan:  No further follow up planned at this time. The patient has been provided with needed resources.  [place], Care Management  [phone] [address] , [place] [address] Email : [email or web address]

## 2021-06-19 ENCOUNTER — Emergency Department (HOSPITAL_COMMUNITY)
Admission: EM | Admit: 2021-06-19 | Discharge: 2021-06-20 | Disposition: A | Discharge status: Home / Self Care | Payer: Medicare Other | Source: Home / Self Care | Attending: Emergency Medicine | Admitting: Emergency Medicine

## 2021-06-19 ENCOUNTER — Other Ambulatory Visit: Payer: Self-pay | Admitting: Internal Medicine

## 2021-06-19 ENCOUNTER — Emergency Department (HOSPITAL_COMMUNITY): Payer: Medicare Other

## 2021-06-19 ENCOUNTER — Telehealth: Payer: Self-pay | Admitting: Internal Medicine

## 2021-06-19 ENCOUNTER — Other Ambulatory Visit: Payer: Self-pay | Admitting: *Deleted

## 2021-06-19 DIAGNOSIS — R112 Nausea with vomiting, unspecified: Secondary | ICD-10-CM

## 2021-06-19 DIAGNOSIS — R0902 Hypoxemia: Secondary | ICD-10-CM | POA: Diagnosis not present

## 2021-06-19 DIAGNOSIS — Z96652 Presence of left artificial knee joint: Secondary | ICD-10-CM | POA: Insufficient documentation

## 2021-06-19 DIAGNOSIS — R404 Transient alteration of awareness: Secondary | ICD-10-CM | POA: Diagnosis not present

## 2021-06-19 DIAGNOSIS — K4090 Unilateral inguinal hernia, without obstruction or gangrene, not specified as recurrent: Secondary | ICD-10-CM | POA: Diagnosis not present

## 2021-06-19 DIAGNOSIS — E039 Hypothyroidism, unspecified: Secondary | ICD-10-CM | POA: Insufficient documentation

## 2021-06-19 DIAGNOSIS — I129 Hypertensive chronic kidney disease with stage 1 through stage 4 chronic kidney disease, or unspecified chronic kidney disease: Secondary | ICD-10-CM | POA: Insufficient documentation

## 2021-06-19 DIAGNOSIS — R531 Weakness: Secondary | ICD-10-CM

## 2021-06-19 DIAGNOSIS — Z79899 Other long term (current) drug therapy: Secondary | ICD-10-CM | POA: Insufficient documentation

## 2021-06-19 DIAGNOSIS — R110 Nausea: Secondary | ICD-10-CM | POA: Insufficient documentation

## 2021-06-19 DIAGNOSIS — E872 Acidosis: Secondary | ICD-10-CM | POA: Diagnosis not present

## 2021-06-19 DIAGNOSIS — Z87891 Personal history of nicotine dependence: Secondary | ICD-10-CM | POA: Insufficient documentation

## 2021-06-19 DIAGNOSIS — Z794 Long term (current) use of insulin: Secondary | ICD-10-CM | POA: Insufficient documentation

## 2021-06-19 DIAGNOSIS — Z7982 Long term (current) use of aspirin: Secondary | ICD-10-CM | POA: Insufficient documentation

## 2021-06-19 DIAGNOSIS — E1122 Type 2 diabetes mellitus with diabetic chronic kidney disease: Secondary | ICD-10-CM | POA: Insufficient documentation

## 2021-06-19 DIAGNOSIS — N183 Chronic kidney disease, stage 3 unspecified: Secondary | ICD-10-CM | POA: Insufficient documentation

## 2021-06-19 DIAGNOSIS — Z7984 Long term (current) use of oral hypoglycemic drugs: Secondary | ICD-10-CM | POA: Insufficient documentation

## 2021-06-19 DIAGNOSIS — N3090 Cystitis, unspecified without hematuria: Secondary | ICD-10-CM | POA: Diagnosis not present

## 2021-06-19 DIAGNOSIS — B3781 Candidal esophagitis: Secondary | ICD-10-CM | POA: Diagnosis not present

## 2021-06-19 DIAGNOSIS — I10 Essential (primary) hypertension: Secondary | ICD-10-CM | POA: Diagnosis not present

## 2021-06-19 DIAGNOSIS — K5900 Constipation, unspecified: Secondary | ICD-10-CM | POA: Diagnosis not present

## 2021-06-19 DIAGNOSIS — K920 Hematemesis: Secondary | ICD-10-CM | POA: Diagnosis not present

## 2021-06-19 DIAGNOSIS — D539 Nutritional anemia, unspecified: Secondary | ICD-10-CM | POA: Diagnosis not present

## 2021-06-19 DIAGNOSIS — K458 Other specified abdominal hernia without obstruction or gangrene: Secondary | ICD-10-CM | POA: Diagnosis not present

## 2021-06-19 DIAGNOSIS — R042 Hemoptysis: Secondary | ICD-10-CM | POA: Diagnosis not present

## 2021-06-19 DIAGNOSIS — K439 Ventral hernia without obstruction or gangrene: Secondary | ICD-10-CM | POA: Diagnosis not present

## 2021-06-19 DIAGNOSIS — Z20822 Contact with and (suspected) exposure to covid-19: Secondary | ICD-10-CM | POA: Diagnosis not present

## 2021-06-19 DIAGNOSIS — E860 Dehydration: Secondary | ICD-10-CM | POA: Insufficient documentation

## 2021-06-19 DIAGNOSIS — K5730 Diverticulosis of large intestine without perforation or abscess without bleeding: Secondary | ICD-10-CM | POA: Diagnosis not present

## 2021-06-19 DIAGNOSIS — I2510 Atherosclerotic heart disease of native coronary artery without angina pectoris: Secondary | ICD-10-CM | POA: Insufficient documentation

## 2021-06-19 DIAGNOSIS — R000 Tachycardia, unspecified: Secondary | ICD-10-CM | POA: Diagnosis not present

## 2021-06-19 DIAGNOSIS — A419 Sepsis, unspecified organism: Secondary | ICD-10-CM | POA: Diagnosis not present

## 2021-06-19 MED ORDER — ONDANSETRON HCL 4 MG/2ML IJ SOLN
4.0000 mg | Freq: Once | INTRAMUSCULAR | Status: AC
  Administered 2021-06-20: 4 mg via INTRAVENOUS
  Filled 2021-06-19: qty 2

## 2021-06-19 MED ORDER — SODIUM CHLORIDE 0.9 % IV BOLUS
1000.0000 mL | Freq: Once | INTRAVENOUS | Status: AC
  Administered 2021-06-20: 1000 mL via INTRAVENOUS
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
## 2021-06-19 NOTE — Telephone Encounter (Signed)
I think this request probably falls under the FMLA type documentation that is required from companies over 29 employees  If this applies to the daughter that she works for this type of employer, I would feel more comfortable with this type of document than writing a letter simply based on her statements as listed

## 2021-06-19 NOTE — Telephone Encounter (Signed)
Please refill as per office routine med refill policy (all routine meds to be refilled for 3 mo or monthly (per pt preference) up to one year from last visit, then month to month grace period for 3 mo, then further med refills will have to be denied) ? ?

## 2021-06-19 NOTE — ED Provider Notes (Signed)
Patient [name] DEPT Provider Note   CSN: [id number] Arrival date & time: 06/19/21  2209     History Chief Complaint  Patient presents with   ams   uti    [name] is a 70 y.o. female.  Patient is a 70 year old female with extensive past medical history including diabetes, hypertension, hyperlipidemia, anemia, and recent admission for femur fracture requiring surgical repair and subsequent admission involving dehydration and weakness.  Patient was discharged to a rehab facility, then returned home 3 days ago.  Since returning home.  Patient has been increasingly weak and has been unable to ambulate.  This evening patient was vomiting and has not eaten or drank anything in nearly 24 hours.  Patient denies specific complaints such as abdominal pain or chest pain.  She denies to me she is having any fevers.  Much of the history taken from the patient's significant other who was present at bedside.  Patient somewhat slow to answer my questions.  The history is provided by the patient.      Past Medical History:  Diagnosis Date   Anxiety    Arthritis    Blood dyscrasia    "FREE BLEEDER"   CERVICAL RADICULOPATHY, LEFT    Chronic back pain    CKD (chronic kidney disease)    DR. SANFORD  [facility]   COMMON MIGRAINE    CORONARY ARTERY DISEASE    Cough    CURRENT COLD   Cystitis    DEPRESSION    Diabetes mellitus, type II ([place])    DIVERTICULOSIS-COLON    Dysrhythmia    palpitations   Gastric ulcer 04/2008   Gastroparesis    GERD (gastroesophageal reflux disease)    Hiatal hernia    Hyperlipidemia    Hypertension    Hypothyroidism    INSOMNIA-SLEEP DISORDER-UNSPEC    Iron deficiency anemia    Wears glasses     Patient Active Problem List   Diagnosis Date Noted   Cognitive disorder 05/21/2021   Generalized weakness 05/17/2021   Femur fracture ([place]) 05/01/2021   Bilateral hand pain 05/01/2021   Weight loss 04/15/2021    Leukocytosis 04/15/2021   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   Acute diverticulitis 03/21/2021   Degenerative disc disease, cervical 02/08/2021   Type 2 diabetes mellitus ([place]) 02/02/2021   Acquired hypothyroidism 11/24/2020   Chronic pain 11/24/2020   Severe sepsis ([place]) 11/09/2020   Hyperlipidemia associated with type 2 diabetes mellitus ([place]) 99991111   Acute metabolic encephalopathy 99991111   Acute lower UTI 11/09/2020   Recurrent falls 10/19/2020   Gait disorder 10/11/2020   Fall 10/11/2020   Low grade fever 02/04/2020   Chest pain 01/26/2020   Lumbar post-laminectomy syndrome 10/05/2019   Spinal cord stimulator status 10/05/2019   Body mass index (BMI) 40.0-44.9, adult ([place]) 10/05/2019   Rectal bleeding 09/01/2019   History of peptic ulcer disease 09/01/2019   Screening for viral disease 09/01/2019   Insulin dependent type 2 diabetes mellitus ([place]) 09/01/2019   Pyelonephritis 06/05/2019   Recurrent UTI 06/05/2019   Urinary incontinence 06/05/2019   Nausea & vomiting 02/08/2019   Abdominal pain 02/08/2019   Constipation 02/08/2019   UTI (urinary tract infection) 02/08/2019   LUQ pain 01/21/2019   Cervical radiculopathy at C6 01/28/2018   Left lateral epicondylitis 01/23/2018   Obesity 01/23/2018   Weakness 05/01/2017   Hyperkalemia 05/01/2017   Acute kidney injury superimposed on CKD ([place]) 05/01/2017   Essential (primary) hypertension 05/01/2017   Sepsis (  [place]) 05/01/2017   Dysuria 04/23/2017   Right leg swelling 12/27/2016   Leg pain, lateral, right 11/05/2016   Radiculopathy 04/03/2016   Hypersomnolence 11/23/2015   Right sided abdominal pain 07/25/2015   Malaise and fatigue 11/05/2014   Rotator cuff syndrome 07/26/2014   Fracture of fifth metacarpal bone of right hand 07/08/2014   Right cervical radiculopathy 06/27/2014   Chronic interstitial cystitis 05/23/2014   Osteoarthritis of CMC joint of thumb 04/11/2014   De Quervain's tenosynovitis, left  03/14/2014   CKD (chronic kidney disease), stage III ([place]) 03/03/2014   Lower back pain 03/03/2014   Ingrown nail 03/03/2014   Peripheral edema 02/23/2014   Lateral epicondylitis of right elbow 01/31/2014   Neck pain on left side 11/19/2013   Abdominal discomfort 10/12/2012   Right lumbar radiculopathy 05/11/2012   Peripheral neuropathy 05/11/2012   Vertigo 03/29/2012   Headache(784.0) 03/10/2012   Left lower quadrant abdominal pain 10/24/2011   Lumbar radiculopathy 10/24/2011   Hypothyroidism 08/19/2011   Right leg weakness 04/04/2011   Hematochezia 01/07/2011   Preventative health care 01/07/2011   CHOLELITHIASIS 05/04/2009   GERD 04/26/2009   Gastroparesis 04/26/2009   ULCER-GASTRIC 03/15/2009   DIVERTICULOSIS-COLON 03/15/2009   Cervical radiculopathy 10/28/2008   MENOPAUSAL DISORDER 06/29/2008   Pain in joint, shoulder region 10/12/2007   INSOMNIA-SLEEP DISORDER-UNSPEC 09/01/2007   Depression 09/01/2007   Macrocytic anemia 08/13/2007   COMMON MIGRAINE 08/13/2007   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   Diabetes ([place]) 05/06/2007   HLD (hyperlipidemia) 05/06/2007   ANXIETY 05/06/2007   Hypertension associated with diabetes ([place]) 05/06/2007   Coronary atherosclerosis 05/06/2007    Past Surgical History:  Procedure Laterality Date   ABDOMINAL HYSTERECTOMY     ANTERIOR LAT LUMBAR FUSION Left 08/13/2017   Procedure: LEFT SIDED LUMBAR 3-4 LATERAL INTERBODY FUSION WITH INSTRUMENTATION AND ALLOGRAFT;  Surgeon: Phylliss Bob, MD;  Location: [facility];  Service: Orthopedics;  Laterality: Left;  LEFT SIDED LUMBAR 3-4 LATERAL INTERBODY FUSION WITH INSTRUMENTATION AND ALLOGRAFT; REQUEST 3 HOURS   Back Stimulator  07/2018   BACK SURGERY  03/2016   BREAST EXCISIONAL BIOPSY Right    40 years ago   CHOLECYSTECTOMY  06/2009   COLONOSCOPY     ESOPHAGOGASTRODUODENOSCOPY     EYE SURGERY Bilateral    lasik   Gastric Wedge resection lipoma  11/2007   x2 with laparotomy and gastrostomy    JOINT REPLACEMENT     Left knee replacement     ORIF FEMUR FRACTURE Right 04/16/2021   Procedure: OPEN REDUCTION INTERNAL FIXATION (ORIF) DISTAL FEMUR FRACTURE;  Surgeon: Shona Needles, MD;  Location: [facility];  Service: Orthopedics;  Laterality: Right;   Rigth knee replacement with revision  04/2008   Dr. Berenice Primas   ROTATOR CUFF REPAIR Left 01/2009   s/p bladder surgury  09/2009   Dr. Terance Hart   SPINAL CORD STIMULATOR INSERTION N/A 09/11/2018   Procedure: LUMBAR SPINAL CORD STIMULATOR INSERTION;  Surgeon: Clydell Hakim, MD;  Location: [facility];  Service: Neurosurgery;  Laterality: N/A;  LUMBAR SPINAL CORD STIMULATOR INSERTION     OB History     Gravida  3   Para  3   Term      Preterm      AB      Living  3      SAB      IAB      Ectopic      Multiple      Live Births  Family History  Problem Relation Age of Onset   Diabetes Sister        x 3   Heart disease Sister        x2   Breast cancer Sister 89   Diabetes Brother        x3   Heart disease Brother        x2   Coronary artery disease Other        female 1st degree   Hypertension Other    Breast cancer Sister 32   Breast cancer Other 25   Diabetes Brother    Colon cancer Neg Hx     Social History   Tobacco Use   Smoking status: Former   Smokeless tobacco: Never   Tobacco comments:    quit 30 years ago  Vaping Use   Vaping Use: Never used  Substance Use Topics   Alcohol use: No    Alcohol/week: 0.0 standard drinks   Drug use: No    Home Medications Prior to Admission medications   Medication Sig Start Date End Date Taking? Authorizing Provider  amLODipine (NORVASC) 10 MG tablet Take 1 tablet (10 mg total) by mouth daily. 04/25/21   Shelly Coss, MD  aspirin 81 MG EC tablet Take 1 tablet (81 mg total) by mouth 2 (two) times daily. After one month,continue taking once a day 04/24/21   Shelly Coss, MD  atorvastatin (LIPITOR) 40 MG tablet TAKE ONE TABLET BY MOUTH EVERY NIGHT AT  BEDTIME Patient taking differently: Take 40 mg by mouth daily. 06/13/20   Biagio Borg, MD  cloNIDine (CATAPRES) 0.2 MG tablet Take 1 tablet (0.2 mg total) by mouth 2 (two) times daily. 04/24/21   Shelly Coss, MD  Continuous Blood Gluc Receiver (FREESTYLE LIBRE 2 READER) DEVI Use as directed twice daily E11.9 10/11/20   Biagio Borg, MD  Continuous Blood Gluc Sensor (FREESTYLE LIBRE 2 SENSOR) MISC Use as directed once bi-weekly E11.9 10/11/20   Biagio Borg, MD  docusate sodium (COLACE) 100 MG capsule Take 1 capsule (100 mg total) by mouth 2 (two) times daily as needed for mild constipation. 05/21/21   Dessa Phi, DO  Dulaglutide (TRULICITY) 1.5 0000000 SOPN Inject 1.5 mg into the skin every Monday. 02/02/21   [provider]  ELMIRON 100 MG capsule Take 200 mg by mouth 2 (two) times daily.  07/20/13   [provider]  fexofenadine (ALLEGRA) 180 MG tablet Take 1 tablet (180 mg total) by mouth daily. 05/01/21 05/01/22  Biagio Borg, MD  FLUoxetine (PROZAC) 20 MG capsule TAKE ONE CAPSULE BY MOUTH DAILY Patient taking differently: Take 20 mg by mouth daily. 11/13/20   Biagio Borg, MD  glucose blood (FREESTYLE TEST STRIPS) test strip Use as instructed to check blood sugar 4 times daily. 04/20/18   Elayne Snare, MD  glucose blood test strip Use as instructed 12/21/20   Elayne Snare, MD  Insulin Glargine [facility]) 100 UNIT/ML Inject 30 Units into the skin daily. 02/02/21   [provider]  insulin lispro (HUMALOG) 100 UNIT/ML injection Inject 3-20 Units into the skin See admin instructions. Per sliding scale 3 times daily    [provider]  isosorbide mononitrate (IMDUR) 60 MG 24 hr tablet Take 1 tablet (60 mg total) by mouth daily. 02/21/20   Biagio Borg, MD  levothyroxine (SYNTHROID) 50 MCG tablet Take 1 tablet (50 mcg total) by mouth daily at 6 (six) AM. 04/25/21   Shelly Coss,  MD  linaclotide (LINZESS) 72 MCG capsule Take 1 capsule (72 mcg total) by mouth  daily before breakfast. 11/13/20   Biagio Borg, MD  losartan-hydrochlorothiazide (HYZAAR) 100-25 MG tablet Take 1 tablet by mouth daily. 05/14/21   [provider]  Loteprednol Etabonate (LOTEMAX SM) 0.38 % GEL Place 1 drop into both eyes 3 (three) times daily. 11/29/20   [provider]  megestrol (MEGACE) 400 MG/10ML suspension Take 10 mLs (400 mg total) by mouth daily. 05/22/21   Dessa Phi, DO  metoprolol succinate (TOPROL-XL) 50 MG 24 hr tablet TAKE 1 TABLET BY MOUTH DAILY WITH OR IMMEDIATELY FOLLOWING A MEAL Patient taking differently: Take 50 mg by mouth daily. 03/29/21   Biagio Borg, MD  nitroGLYCERIN (NITROSTAT) 0.4 MG SL tablet Place 1 tablet (0.4 mg total) under the tongue every 5 (five) minutes as needed for chest pain. 01/27/20   Ghimire, Henreitta Leber, MD  ondansetron (ZOFRAN ODT) 4 MG disintegrating tablet Take 1 tablet (4 mg total) by mouth every 8 (eight) hours as needed for up to 15 doses for nausea or vomiting. 03/21/21   [name], MD  pantoprazole (PROTONIX) 40 MG tablet TAKE 1 TABLET(40 MG) BY MOUTH TWICE DAILY Patient taking differently: Take 40 mg by mouth 2 (two) times daily. TAKE 1 TABLET(40 MG) BY MOUTH TWICE DAILY 11/13/20   Biagio Borg, MD  polyethylene glycol (MIRALAX / GLYCOLAX) 17 g packet Take 17 g by mouth daily as needed. 01/27/20   Ghimire, Henreitta Leber, MD  traMADol (ULTRAM) 50 MG tablet Take 1 tablet (50 mg total) by mouth every 6 (six) hours as needed. 06/12/21   Isla Pence, MD  Vitamin D, Ergocalciferol, (DRISDOL) 1.25 MG (50000 UNIT) CAPS capsule Take 50,000 Units by mouth every 7 (seven) days.    [provider]  enoxaparin (LOVENOX) 40 MG/0.4ML injection Inject 0.4 mLs (40 mg total) into the skin daily. 04/18/21 04/24/21  Delray Alt, PA-C    Allergies    Ciprofloxacin, Hydrocodone, Hydrocortisone, Tomato, and Penicillins  Review of Systems   Review of Systems  All other systems reviewed and are negative.  Physical  Exam Updated Vital Signs BP (!) 156/82   Pulse 93   Temp 97.9 F (36.6 C)   Resp 15   SpO2 100%   Physical Exam Vitals and nursing note reviewed.  Constitutional:      General: She is not in acute distress.    Appearance: She is well-developed. She is not diaphoretic.     Comments: Patient is awake and alert.  She is in no acute distress.  She is somewhat slow to respond, but does follow commands and answers questions appropriately.  HENT:     Head: Normocephalic and atraumatic.  Cardiovascular:     Rate and Rhythm: Normal rate and regular rhythm.     Heart sounds: No murmur heard.   No friction rub. No gallop.  Pulmonary:     Effort: Pulmonary effort is normal. No respiratory distress.     Breath sounds: Normal breath sounds. No wheezing.  Abdominal:     General: Bowel sounds are normal. There is no distension.     Palpations: Abdomen is soft.     Tenderness: There is no abdominal tenderness.  Musculoskeletal:        General: Normal range of motion.     Cervical back: Normal range of motion and neck supple.     Right lower leg: No edema.     Left  lower leg: No edema.  Skin:    General: Skin is warm and dry.  Neurological:     General: No focal deficit present.     Mental Status: She is alert and oriented to person, place, and time.    ED Results / Procedures / Treatments   Labs (all labs ordered are listed, but only abnormal results are displayed) Labs Reviewed  CULTURE, BLOOD (ROUTINE X 2)  CULTURE, BLOOD (ROUTINE X 2)  COMPREHENSIVE METABOLIC PANEL  LACTIC ACID, PLASMA  LACTIC ACID, PLASMA  CBC WITH DIFFERENTIAL/PLATELET  PROTIME-INR  URINALYSIS, ROUTINE W REFLEX MICROSCOPIC  LIPASE, BLOOD  CK    EKG EKG Interpretation  Date/Time:  Wednesday June 20 2021 00:59:25 EDT Ventricular Rate:  98 PR Interval:  147 QRS Duration: 94 QT Interval:  419 QTC Calculation: 535 R Axis:   51 Text Interpretation: Sinus rhythm Nonspecific T abnormalities,  inferior leads Prolonged QT interval No significant change since 06/12/2021 Confirmed by Veryl Speak [phone]) on 06/12/2021 1:37:55 AM  Radiology No results found.  Procedures Procedures   Medications Ordered in ED Medications  sodium chloride 0.9 % bolus 1,000 mL (has no administration in time range)  ondansetron (ZOFRAN) injection 4 mg (has no administration in time range)    ED Course  I have reviewed the triage vital signs and the nursing notes.  Pertinent labs & imaging results that were available during my care of the patient were reviewed by me and considered in my medical decision making (see chart for details).    MDM Rules/Calculators/A&P  Is a 70 year old female with past medical history as per HPI.  She presents today with complaints of weakness, nausea, and decreased p.o. intake.  Patient recently discharged from rehab.  Her work-up today shows no acute abnormality.  Laboratory studies are consistent with baseline.  Patient hydrated with normal saline, given medicine for nausea, and seems to be feeling significantly improved.    Disposition discussed with patient and husband.  Patient prefers to go home.  Husband seems comfortable with this.  Patient has an upcoming therapy session today.  I suspect her symptoms are related to deconditioning from recent hospitalizations and therapy is likely to be beneficial.  Final Clinical Impression(s) / ED Diagnoses Final diagnoses:  None    Rx / DC Orders ED Discharge Orders     None        Veryl Speak, MD 06/11/2021 0530

## 2021-06-19 NOTE — Addendum Note (Signed)
Addended by: Harless Litten on: 06/19/2021 05:09 PM   Modules accepted: Orders
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
## 2021-06-19 NOTE — Telephone Encounter (Signed)
Patient daughter Donella Stade calling in  Says she is trying to get employer to allow her to [facility] since she is her mothers sole POA & care giver but she needs note from provider  Please call [phone]

## 2021-06-20 ENCOUNTER — Encounter (HOSPITAL_COMMUNITY): Payer: Self-pay | Admitting: Emergency Medicine

## 2021-06-20 ENCOUNTER — Encounter: Payer: Self-pay | Admitting: Internal Medicine

## 2021-06-20 ENCOUNTER — Ambulatory Visit (INDEPENDENT_AMBULATORY_CARE_PROVIDER_SITE_OTHER): Payer: Medicare Other | Admitting: Internal Medicine

## 2021-06-20 ENCOUNTER — Other Ambulatory Visit: Payer: Self-pay

## 2021-06-20 ENCOUNTER — Emergency Department (HOSPITAL_COMMUNITY): Payer: Medicare Other

## 2021-06-20 ENCOUNTER — Inpatient Hospital Stay (HOSPITAL_COMMUNITY)
Admission: EM | Admit: 2021-06-20 | Discharge: 2021-07-02 | DRG: 812 | Disposition: A | Discharge status: Skilled Nursing Facility | Payer: Medicare Other | Attending: Internal Medicine | Admitting: Internal Medicine

## 2021-06-20 ENCOUNTER — Ambulatory Visit (INDEPENDENT_AMBULATORY_CARE_PROVIDER_SITE_OTHER): Payer: Medicare Other | Admitting: *Deleted

## 2021-06-20 VITALS — BP 142/70 | HR 134 | Temp 98.2°F

## 2021-06-20 DIAGNOSIS — K5730 Diverticulosis of large intestine without perforation or abscess without bleeding: Secondary | ICD-10-CM | POA: Diagnosis not present

## 2021-06-20 DIAGNOSIS — F09 Unspecified mental disorder due to known physiological condition: Secondary | ICD-10-CM | POA: Diagnosis not present

## 2021-06-20 DIAGNOSIS — R531 Weakness: Secondary | ICD-10-CM

## 2021-06-20 DIAGNOSIS — Z993 Dependence on wheelchair: Secondary | ICD-10-CM

## 2021-06-20 DIAGNOSIS — K5900 Constipation, unspecified: Secondary | ICD-10-CM | POA: Diagnosis not present

## 2021-06-20 DIAGNOSIS — E1165 Type 2 diabetes mellitus with hyperglycemia: Secondary | ICD-10-CM | POA: Diagnosis present

## 2021-06-20 DIAGNOSIS — F32A Depression, unspecified: Secondary | ICD-10-CM | POA: Diagnosis present

## 2021-06-20 DIAGNOSIS — E869 Volume depletion, unspecified: Secondary | ICD-10-CM | POA: Diagnosis present

## 2021-06-20 DIAGNOSIS — K4090 Unilateral inguinal hernia, without obstruction or gangrene, not specified as recurrent: Secondary | ICD-10-CM | POA: Diagnosis not present

## 2021-06-20 DIAGNOSIS — R609 Edema, unspecified: Secondary | ICD-10-CM | POA: Diagnosis present

## 2021-06-20 DIAGNOSIS — D72829 Elevated white blood cell count, unspecified: Secondary | ICD-10-CM | POA: Diagnosis present

## 2021-06-20 DIAGNOSIS — I2510 Atherosclerotic heart disease of native coronary artery without angina pectoris: Secondary | ICD-10-CM | POA: Diagnosis present

## 2021-06-20 DIAGNOSIS — D759 Disease of blood and blood-forming organs, unspecified: Secondary | ICD-10-CM | POA: Diagnosis present

## 2021-06-20 DIAGNOSIS — R4189 Other symptoms and signs involving cognitive functions and awareness: Secondary | ICD-10-CM | POA: Diagnosis present

## 2021-06-20 DIAGNOSIS — Z7901 Long term (current) use of anticoagulants: Secondary | ICD-10-CM

## 2021-06-20 DIAGNOSIS — E039 Hypothyroidism, unspecified: Secondary | ICD-10-CM | POA: Diagnosis present

## 2021-06-20 DIAGNOSIS — F419 Anxiety disorder, unspecified: Secondary | ICD-10-CM | POA: Diagnosis present

## 2021-06-20 DIAGNOSIS — N3090 Cystitis, unspecified without hematuria: Secondary | ICD-10-CM | POA: Diagnosis present

## 2021-06-20 DIAGNOSIS — Z888 Allergy status to other drugs, medicaments and biological substances status: Secondary | ICD-10-CM

## 2021-06-20 DIAGNOSIS — Z794 Long term (current) use of insulin: Secondary | ICD-10-CM

## 2021-06-20 DIAGNOSIS — Z79890 Hormone replacement therapy (postmenopausal): Secondary | ICD-10-CM

## 2021-06-20 DIAGNOSIS — E1122 Type 2 diabetes mellitus with diabetic chronic kidney disease: Secondary | ICD-10-CM | POA: Diagnosis present

## 2021-06-20 DIAGNOSIS — R9431 Abnormal electrocardiogram [ECG] [EKG]: Secondary | ICD-10-CM | POA: Diagnosis not present

## 2021-06-20 DIAGNOSIS — R81 Glycosuria: Secondary | ICD-10-CM | POA: Diagnosis present

## 2021-06-20 DIAGNOSIS — R000 Tachycardia, unspecified: Secondary | ICD-10-CM | POA: Diagnosis not present

## 2021-06-20 DIAGNOSIS — E872 Acidosis, unspecified: Secondary | ICD-10-CM | POA: Diagnosis present

## 2021-06-20 DIAGNOSIS — M1990 Unspecified osteoarthritis, unspecified site: Secondary | ICD-10-CM | POA: Diagnosis present

## 2021-06-20 DIAGNOSIS — Z885 Allergy status to narcotic agent status: Secondary | ICD-10-CM

## 2021-06-20 DIAGNOSIS — G8929 Other chronic pain: Secondary | ICD-10-CM | POA: Diagnosis present

## 2021-06-20 DIAGNOSIS — D539 Nutritional anemia, unspecified: Principal | ICD-10-CM | POA: Diagnosis present

## 2021-06-20 DIAGNOSIS — I152 Hypertension secondary to endocrine disorders: Secondary | ICD-10-CM

## 2021-06-20 DIAGNOSIS — K219 Gastro-esophageal reflux disease without esophagitis: Secondary | ICD-10-CM | POA: Diagnosis present

## 2021-06-20 DIAGNOSIS — K439 Ventral hernia without obstruction or gangrene: Secondary | ICD-10-CM | POA: Diagnosis not present

## 2021-06-20 DIAGNOSIS — R739 Hyperglycemia, unspecified: Secondary | ICD-10-CM | POA: Diagnosis not present

## 2021-06-20 DIAGNOSIS — Z880 Allergy status to penicillin: Secondary | ICD-10-CM

## 2021-06-20 DIAGNOSIS — R0789 Other chest pain: Secondary | ICD-10-CM | POA: Diagnosis present

## 2021-06-20 DIAGNOSIS — E1159 Type 2 diabetes mellitus with other circulatory complications: Secondary | ICD-10-CM | POA: Diagnosis not present

## 2021-06-20 DIAGNOSIS — K449 Diaphragmatic hernia without obstruction or gangrene: Secondary | ICD-10-CM | POA: Diagnosis present

## 2021-06-20 DIAGNOSIS — D649 Anemia, unspecified: Secondary | ICD-10-CM

## 2021-06-20 DIAGNOSIS — E785 Hyperlipidemia, unspecified: Secondary | ICD-10-CM | POA: Diagnosis present

## 2021-06-20 DIAGNOSIS — E119 Type 2 diabetes mellitus without complications: Secondary | ICD-10-CM

## 2021-06-20 DIAGNOSIS — N183 Chronic kidney disease, stage 3 unspecified: Secondary | ICD-10-CM | POA: Diagnosis present

## 2021-06-20 DIAGNOSIS — B3781 Candidal esophagitis: Secondary | ICD-10-CM | POA: Diagnosis present

## 2021-06-20 DIAGNOSIS — Z881 Allergy status to other antibiotic agents status: Secondary | ICD-10-CM

## 2021-06-20 DIAGNOSIS — K458 Other specified abdominal hernia without obstruction or gangrene: Secondary | ICD-10-CM | POA: Diagnosis not present

## 2021-06-20 DIAGNOSIS — N1831 Chronic kidney disease, stage 3a: Secondary | ICD-10-CM | POA: Diagnosis present

## 2021-06-20 DIAGNOSIS — Z8711 Personal history of peptic ulcer disease: Secondary | ICD-10-CM

## 2021-06-20 DIAGNOSIS — R339 Retention of urine, unspecified: Secondary | ICD-10-CM | POA: Diagnosis not present

## 2021-06-20 DIAGNOSIS — R195 Other fecal abnormalities: Secondary | ICD-10-CM

## 2021-06-20 DIAGNOSIS — G4700 Insomnia, unspecified: Secondary | ICD-10-CM | POA: Diagnosis present

## 2021-06-20 DIAGNOSIS — Z6841 Body Mass Index (BMI) 40.0 and over, adult: Secondary | ICD-10-CM

## 2021-06-20 DIAGNOSIS — G43009 Migraine without aura, not intractable, without status migrainosus: Secondary | ICD-10-CM | POA: Diagnosis present

## 2021-06-20 DIAGNOSIS — K5909 Other constipation: Secondary | ICD-10-CM | POA: Diagnosis present

## 2021-06-20 DIAGNOSIS — D7589 Other specified diseases of blood and blood-forming organs: Secondary | ICD-10-CM | POA: Diagnosis present

## 2021-06-20 DIAGNOSIS — I10 Essential (primary) hypertension: Secondary | ICD-10-CM | POA: Diagnosis present

## 2021-06-20 DIAGNOSIS — K2900 Acute gastritis without bleeding: Secondary | ICD-10-CM

## 2021-06-20 DIAGNOSIS — Z20822 Contact with and (suspected) exposure to covid-19: Secondary | ICD-10-CM | POA: Diagnosis present

## 2021-06-20 DIAGNOSIS — Z79899 Other long term (current) drug therapy: Secondary | ICD-10-CM

## 2021-06-20 DIAGNOSIS — Z91018 Allergy to other foods: Secondary | ICD-10-CM

## 2021-06-20 DIAGNOSIS — R824 Acetonuria: Secondary | ICD-10-CM | POA: Diagnosis present

## 2021-06-20 DIAGNOSIS — R5381 Other malaise: Secondary | ICD-10-CM | POA: Diagnosis present

## 2021-06-20 DIAGNOSIS — Z7982 Long term (current) use of aspirin: Secondary | ICD-10-CM

## 2021-06-20 LAB — COMPREHENSIVE METABOLIC PANEL
ALT: 17 U/L (ref 0–44)
AST: 25 U/L (ref 15–41)
Albumin: 3.4 g/dL — ABNORMAL LOW (ref 3.5–5.0)
Alkaline Phosphatase: 54 U/L (ref 38–126)
Anion gap: 13 (ref 5–15)
BUN: 15 mg/dL (ref 8–23)
CO2: 16 mmol/L — ABNORMAL LOW (ref 22–32)
Calcium: 9.5 mg/dL (ref 8.9–10.3)
Chloride: 106 mmol/L (ref 98–111)
Creatinine, Ser: 0.91 mg/dL (ref 0.44–1.00)
GFR, Estimated: >60 mL/min (ref >60)
Glucose, Bld: 170 mg/dL — ABNORMAL HIGH (ref 70–99)
Potassium: 4.2 mmol/L (ref 3.5–5.1)
Sodium: 135 mmol/L (ref 135–145)
Total Bilirubin: 1.1 mg/dL (ref 0.3–1.2)
Total Protein: 7.3 g/dL (ref 6.5–8.1)

## 2021-06-20 LAB — CBC WITH DIFFERENTIAL/PLATELET
Abs Immature Granulocytes: 0.08 10*3/uL — ABNORMAL HIGH (ref 0.00–0.07)
Abs Immature Granulocytes: 0.06 10*3/uL (ref 0.00–0.07)
Basophils Absolute: 0 10*3/uL (ref 0.0–0.1)
Basophils Absolute: 0 10*3/uL (ref 0.0–0.1)
Basophils Relative: 0 %
Basophils Relative: 0 %
Eosinophils Absolute: 0 10*3/uL (ref 0.0–0.5)
Eosinophils Absolute: 0 10*3/uL (ref 0.0–0.5)
Eosinophils Relative: 0 %
Eosinophils Relative: 0 %
HCT: 31 % — ABNORMAL LOW (ref 36.0–46.0)
HCT: 29.1 % — ABNORMAL LOW (ref 36.0–46.0)
Hemoglobin: 9.7 g/dL — ABNORMAL LOW (ref 12.0–15.0)
Hemoglobin: 9 g/dL — ABNORMAL LOW (ref 12.0–15.0)
Immature Granulocytes: 1 %
Immature Granulocytes: 1 %
Lymphocytes Relative: 8 %
Lymphocytes Relative: 11 %
Lymphs Abs: 1.1 10*3/uL (ref 0.7–4.0)
Lymphs Abs: 1.4 10*3/uL (ref 0.7–4.0)
MCH: 33.2 pg (ref 26.0–34.0)
MCH: 33.3 pg (ref 26.0–34.0)
MCHC: 31.3 g/dL (ref 30.0–36.0)
MCHC: 30.9 g/dL (ref 30.0–36.0)
MCV: 106.2 fL — ABNORMAL HIGH (ref 80.0–100.0)
MCV: 107.8 fL — ABNORMAL HIGH (ref 80.0–100.0)
Monocytes Absolute: 0.4 10*3/uL (ref 0.1–1.0)
Monocytes Absolute: 0.7 10*3/uL (ref 0.1–1.0)
Monocytes Relative: 3 %
Monocytes Relative: 6 %
Neutro Abs: 11.6 10*3/uL — ABNORMAL HIGH (ref 1.7–7.7)
Neutro Abs: 10.8 10*3/uL — ABNORMAL HIGH (ref 1.7–7.7)
Neutrophils Relative %: 88 %
Neutrophils Relative %: 82 %
Platelets: 326 10*3/uL (ref 150–400)
Platelets: 372 10*3/uL (ref 150–400)
RBC: 2.92 MIL/uL — ABNORMAL LOW (ref 3.87–5.11)
RBC: 2.7 MIL/uL — ABNORMAL LOW (ref 3.87–5.11)
RDW: 12.9 % (ref 11.5–15.5)
RDW: 13.1 % (ref 11.5–15.5)
WBC: 13.2 10*3/uL — ABNORMAL HIGH (ref 4.0–10.5)
WBC: 12.9 10*3/uL — ABNORMAL HIGH (ref 4.0–10.5)
nRBC: 0 % (ref 0.0–0.2)
nRBC: 0 % (ref 0.0–0.2)

## 2021-06-20 LAB — BASIC METABOLIC PANEL
Anion gap: 14 (ref 5–15)
BUN: 16 mg/dL (ref 8–23)
CO2: 17 mmol/L — ABNORMAL LOW (ref 22–32)
Calcium: 9.8 mg/dL (ref 8.9–10.3)
Chloride: 106 mmol/L (ref 98–111)
Creatinine, Ser: 1.21 mg/dL — ABNORMAL HIGH (ref 0.44–1.00)
GFR, Estimated: 48 mL/min — ABNORMAL LOW (ref >60)
Glucose, Bld: 240 mg/dL — ABNORMAL HIGH (ref 70–99)
Potassium: 3.8 mmol/L (ref 3.5–5.1)
Sodium: 137 mmol/L (ref 135–145)

## 2021-06-20 LAB — LIPASE, BLOOD: Lipase: 22 U/L (ref 11–51)

## 2021-06-20 LAB — URINALYSIS, ROUTINE W REFLEX MICROSCOPIC
Bilirubin Urine: NEGATIVE
Glucose, UA: 250 mg/dL — AB
Hgb urine dipstick: NEGATIVE
Ketones, ur: 15 mg/dL — AB
Leukocytes,Ua: NEGATIVE
Nitrite: NEGATIVE
Protein, ur: NEGATIVE mg/dL
Specific Gravity, Urine: >1.03 — ABNORMAL HIGH (ref 1.005–1.030)
pH: 6 (ref 5.0–8.0)

## 2021-06-20 LAB — PROTIME-INR
INR: 1.2 (ref 0.8–1.2)
Prothrombin Time: 15.5 seconds — ABNORMAL HIGH (ref 11.4–15.2)

## 2021-06-20 LAB — LACTIC ACID, PLASMA
Lactic Acid, Venous: 2.2 mmol/L (ref 0.5–1.9)
Lactic Acid, Venous: 2.3 mmol/L (ref 0.5–1.9)
Lactic Acid, Venous: 2.7 mmol/L (ref 0.5–1.9)
Lactic Acid, Venous: 2.3 mmol/L (ref 0.5–1.9)

## 2021-06-20 LAB — TROPONIN I (HIGH SENSITIVITY)
Troponin I (High Sensitivity): 7 ng/L (ref <18)
Troponin I (High Sensitivity): 9 ng/L (ref <18)

## 2021-06-20 LAB — CK: Total CK: 111 U/L (ref 38–234)

## 2021-06-20 MED ORDER — LACTATED RINGERS IV SOLN: INTRAVENOUS | Status: AC

## 2021-06-20 MED ORDER — LACTATED RINGERS IV BOLUS
1000.0000 mL | Freq: Once | INTRAVENOUS | Status: AC
  Administered 2021-06-20: 1000 mL via INTRAVENOUS

## 2021-06-20 MED ORDER — ONDANSETRON HCL 4 MG PO TABS
4.0000 mg | ORAL_TABLET | Freq: Four times a day (QID) | ORAL | Status: DC | PRN
  Administered 2021-06-25 – 2021-06-29 (×2): 4 mg via ORAL
  Filled 2021-06-20 (×2): qty 1

## 2021-06-20 MED ORDER — LACTULOSE 10 GM/15ML PO SOLN
30.0000 g | Freq: Once | ORAL | Status: AC
  Administered 2021-06-20: 30 g via ORAL
  Filled 2021-06-20: qty 60

## 2021-06-20 MED ORDER — SODIUM CHLORIDE 0.9 % IV SOLN: INTRAVENOUS | Status: DC

## 2021-06-20 MED ORDER — ONDANSETRON HCL 4 MG/2ML IJ SOLN
4.0000 mg | Freq: Four times a day (QID) | INTRAMUSCULAR | Status: DC | PRN
  Administered 2021-06-25: 4 mg via INTRAVENOUS
  Filled 2021-06-20: qty 2

## 2021-06-20 MED ORDER — ACETAMINOPHEN 650 MG RE SUPP: 650.0000 mg | Freq: Four times a day (QID) | RECTAL | Status: DC | PRN

## 2021-06-20 MED ORDER — ACETAMINOPHEN 325 MG PO TABS
650.0000 mg | ORAL_TABLET | Freq: Four times a day (QID) | ORAL | Status: DC | PRN
  Administered 2021-06-22 – 2021-07-02 (×9): 650 mg via ORAL
  Filled 2021-06-20 (×11): qty 2

## 2021-06-20 MED ORDER — SODIUM CHLORIDE 0.9 % IV BOLUS
500.0000 mL | Freq: Once | INTRAVENOUS | Status: AC
  Administered 2021-06-20: 500 mL via INTRAVENOUS

## 2021-06-20 MED ORDER — SODIUM CHLORIDE 0.9 % IV SOLN
12.5000 mg | Freq: Four times a day (QID) | INTRAVENOUS | Status: DC | PRN
  Administered 2021-06-20: 12.5 mg via INTRAVENOUS
  Filled 2021-06-20: qty 12.5

## 2021-06-20 MED ORDER — IOHEXOL 350 MG/ML SOLN
80.0000 mL | Freq: Once | INTRAVENOUS | Status: AC | PRN
  Administered 2021-06-20: 80 mL via INTRAVENOUS

## 2021-06-20 MED ORDER — ENOXAPARIN SODIUM 60 MG/0.6ML IJ SOSY
55.0000 mg | PREFILLED_SYRINGE | INTRAMUSCULAR | Status: DC
  Administered 2021-06-21 – 2021-06-23 (×3): 55 mg via SUBCUTANEOUS
  Filled 2021-06-20 (×2): qty 0.6
  Filled 2021-06-20: qty 0.55

## 2021-06-20 MED ORDER — INSULIN ASPART 100 UNIT/ML IJ SOLN
0.0000 [IU] | Freq: Three times a day (TID) | INTRAMUSCULAR | Status: DC
  Administered 2021-06-21: 4 [IU] via SUBCUTANEOUS
  Administered 2021-06-21: 10 [IU] via SUBCUTANEOUS
  Administered 2021-06-22 (×2): 3 [IU] via SUBCUTANEOUS
  Administered 2021-06-22: 4 [IU] via SUBCUTANEOUS
  Administered 2021-06-23 (×3): 3 [IU] via SUBCUTANEOUS
  Administered 2021-06-24: 7 [IU] via SUBCUTANEOUS
  Administered 2021-06-24 – 2021-06-25 (×3): 4 [IU] via SUBCUTANEOUS
  Administered 2021-06-25 – 2021-07-01 (×10): 3 [IU] via SUBCUTANEOUS
  Filled 2021-06-20: qty 0.2

## 2021-06-20 MED ORDER — ONDANSETRON 8 MG PO TBDP
ORAL_TABLET | ORAL | 0 refills | Status: AC
Start: 2021-06-20 — End: ?

## 2021-06-20 NOTE — ED Triage Notes (Signed)
T presents to ED via ems cc tachycardia. Per ems, pt was being seen at her PCP's office for a check up. Pt Alert and oriented to person, place, situation. Pt c/o chest pain. Pt states cp is midsternal stabbing. Pt states pain 10/10.

## 2021-06-20 NOTE — Patient Instructions (Signed)
You have a fast heart rate and confusion today  We will need to call EMS for transport to the ED.

## 2021-06-20 NOTE — ED Provider Notes (Signed)
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 requires medical management in the hospital setting with abdominal social work for determining safe discharge plan.  She may need to be assessed by physical and Occupational Therapy.  CRITICAL CARE-yes Performed by: Daleen Bo  Nursing Notes Reviewed/ Care Coordinated Applicable Imaging Reviewed Interpretation of Laboratory Data incorporated into ED treatment  11:06 PM-Consult complete with hospitalist. Patient case explained and discussed.  He agrees to admit patient for further evaluation and treatment. Call ended at 11:15 PM    Final Clinical Impression(s) / ED Diagnoses Final diagnoses:  Tachycardia  Constipation, unspecified constipation type    Rx / DC Orders ED Discharge Orders     None        Daleen Bo, MD 06/21/2021 2336

## 2021-06-20 NOTE — ED Notes (Signed)
Pt tolerated PO without difficulty.

## 2021-06-20 NOTE — Assessment & Plan Note (Addendum)
ECG reviewed, pt felt to be unstable and in need of repeat ED visit due to in appropriate tachycardia and confusion,  EMS to be called

## 2021-06-20 NOTE — ED Notes (Signed)
Please call [name], husband, first.
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
## 2021-06-20 NOTE — Assessment & Plan Note (Signed)
Mild elevated, cont same tx - norvasc, hct, losartan, toprol

## 2021-06-20 NOTE — Discharge Instructions (Addendum)
Take Zofran as prescribed as needed for nausea.  Return to the emergency department if you develop severe pain, high fever, worsening weakness, or other new and concerning symptoms.

## 2021-06-20 NOTE — Assessment & Plan Note (Signed)
Confused today, difficult to know how much is new, but suspect more acute than chronic  today

## 2021-06-20 NOTE — Telephone Encounter (Signed)
Spoke with pts daughter was able to inform her of Dr. Gwynn Burly instructions. She states she understood and will be reaching out to her job for a FMLA modification form so that her current FMLA will be updated to [employer] work only instead of intermittent.
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
## 2021-06-20 NOTE — ED Notes (Signed)
In and out cath done with the assistance of two other techs to obtain urine. Pt tolerated well.

## 2021-06-20 NOTE — ED Notes (Signed)
Patient is very nauseous and weak. Zofran given, however patient is still nauseous. MD ordered new medication.

## 2021-06-21 ENCOUNTER — Ambulatory Visit: Payer: Self-pay | Admitting: *Deleted

## 2021-06-21 ENCOUNTER — Encounter (HOSPITAL_COMMUNITY): Payer: Self-pay | Admitting: Internal Medicine

## 2021-06-21 DIAGNOSIS — N183 Chronic kidney disease, stage 3 unspecified: Secondary | ICD-10-CM | POA: Diagnosis not present

## 2021-06-21 DIAGNOSIS — B3781 Candidal esophagitis: Secondary | ICD-10-CM | POA: Diagnosis present

## 2021-06-21 DIAGNOSIS — R278 Other lack of coordination: Secondary | ICD-10-CM | POA: Diagnosis not present

## 2021-06-21 DIAGNOSIS — R531 Weakness: Secondary | ICD-10-CM | POA: Diagnosis not present

## 2021-06-21 DIAGNOSIS — M6259 Muscle wasting and atrophy, not elsewhere classified, multiple sites: Secondary | ICD-10-CM | POA: Diagnosis not present

## 2021-06-21 DIAGNOSIS — R000 Tachycardia, unspecified: Secondary | ICD-10-CM | POA: Diagnosis not present

## 2021-06-21 DIAGNOSIS — E1122 Type 2 diabetes mellitus with diabetic chronic kidney disease: Secondary | ICD-10-CM | POA: Diagnosis present

## 2021-06-21 DIAGNOSIS — E039 Hypothyroidism, unspecified: Secondary | ICD-10-CM | POA: Diagnosis present

## 2021-06-21 DIAGNOSIS — F32A Depression, unspecified: Secondary | ICD-10-CM | POA: Diagnosis present

## 2021-06-21 DIAGNOSIS — G43009 Migraine without aura, not intractable, without status migrainosus: Secondary | ICD-10-CM | POA: Diagnosis present

## 2021-06-21 DIAGNOSIS — R5381 Other malaise: Secondary | ICD-10-CM | POA: Diagnosis not present

## 2021-06-21 DIAGNOSIS — K219 Gastro-esophageal reflux disease without esophagitis: Secondary | ICD-10-CM | POA: Diagnosis not present

## 2021-06-21 DIAGNOSIS — D72829 Elevated white blood cell count, unspecified: Secondary | ICD-10-CM | POA: Diagnosis present

## 2021-06-21 DIAGNOSIS — M1990 Unspecified osteoarthritis, unspecified site: Secondary | ICD-10-CM | POA: Diagnosis present

## 2021-06-21 DIAGNOSIS — R2681 Unsteadiness on feet: Secondary | ICD-10-CM | POA: Diagnosis not present

## 2021-06-21 DIAGNOSIS — D7589 Other specified diseases of blood and blood-forming organs: Secondary | ICD-10-CM | POA: Diagnosis present

## 2021-06-21 DIAGNOSIS — E869 Volume depletion, unspecified: Secondary | ICD-10-CM | POA: Diagnosis present

## 2021-06-21 DIAGNOSIS — I152 Hypertension secondary to endocrine disorders: Secondary | ICD-10-CM

## 2021-06-21 DIAGNOSIS — N1831 Chronic kidney disease, stage 3a: Secondary | ICD-10-CM | POA: Diagnosis present

## 2021-06-21 DIAGNOSIS — D62 Acute posthemorrhagic anemia: Secondary | ICD-10-CM | POA: Diagnosis not present

## 2021-06-21 DIAGNOSIS — G4700 Insomnia, unspecified: Secondary | ICD-10-CM | POA: Diagnosis present

## 2021-06-21 DIAGNOSIS — R293 Abnormal posture: Secondary | ICD-10-CM | POA: Diagnosis not present

## 2021-06-21 DIAGNOSIS — Z741 Need for assistance with personal care: Secondary | ICD-10-CM | POA: Diagnosis not present

## 2021-06-21 DIAGNOSIS — G3184 Mild cognitive impairment, so stated: Secondary | ICD-10-CM | POA: Diagnosis not present

## 2021-06-21 DIAGNOSIS — E1169 Type 2 diabetes mellitus with other specified complication: Secondary | ICD-10-CM | POA: Diagnosis not present

## 2021-06-21 DIAGNOSIS — K222 Esophageal obstruction: Secondary | ICD-10-CM | POA: Diagnosis not present

## 2021-06-21 DIAGNOSIS — I10 Essential (primary) hypertension: Secondary | ICD-10-CM | POA: Diagnosis not present

## 2021-06-21 DIAGNOSIS — Z8711 Personal history of peptic ulcer disease: Secondary | ICD-10-CM | POA: Diagnosis not present

## 2021-06-21 DIAGNOSIS — R339 Retention of urine, unspecified: Secondary | ICD-10-CM | POA: Diagnosis not present

## 2021-06-21 DIAGNOSIS — E1165 Type 2 diabetes mellitus with hyperglycemia: Secondary | ICD-10-CM

## 2021-06-21 DIAGNOSIS — R195 Other fecal abnormalities: Secondary | ICD-10-CM | POA: Diagnosis not present

## 2021-06-21 DIAGNOSIS — E872 Acidosis: Secondary | ICD-10-CM | POA: Diagnosis present

## 2021-06-21 DIAGNOSIS — Z20822 Contact with and (suspected) exposure to covid-19: Secondary | ICD-10-CM | POA: Diagnosis present

## 2021-06-21 DIAGNOSIS — G8929 Other chronic pain: Secondary | ICD-10-CM | POA: Diagnosis present

## 2021-06-21 DIAGNOSIS — I2510 Atherosclerotic heart disease of native coronary artery without angina pectoris: Secondary | ICD-10-CM | POA: Diagnosis not present

## 2021-06-21 DIAGNOSIS — D649 Anemia, unspecified: Secondary | ICD-10-CM | POA: Diagnosis not present

## 2021-06-21 DIAGNOSIS — Z6841 Body Mass Index (BMI) 40.0 and over, adult: Secondary | ICD-10-CM | POA: Diagnosis not present

## 2021-06-21 DIAGNOSIS — M6281 Muscle weakness (generalized): Secondary | ICD-10-CM | POA: Diagnosis not present

## 2021-06-21 DIAGNOSIS — K2970 Gastritis, unspecified, without bleeding: Secondary | ICD-10-CM | POA: Diagnosis not present

## 2021-06-21 DIAGNOSIS — E875 Hyperkalemia: Secondary | ICD-10-CM | POA: Diagnosis not present

## 2021-06-21 DIAGNOSIS — F411 Generalized anxiety disorder: Secondary | ICD-10-CM | POA: Diagnosis not present

## 2021-06-21 DIAGNOSIS — K5909 Other constipation: Secondary | ICD-10-CM | POA: Diagnosis present

## 2021-06-21 DIAGNOSIS — D759 Disease of blood and blood-forming organs, unspecified: Secondary | ICD-10-CM | POA: Diagnosis present

## 2021-06-21 DIAGNOSIS — E119 Type 2 diabetes mellitus without complications: Secondary | ICD-10-CM | POA: Diagnosis not present

## 2021-06-21 DIAGNOSIS — Z7401 Bed confinement status: Secondary | ICD-10-CM | POA: Diagnosis not present

## 2021-06-21 DIAGNOSIS — Z794 Long term (current) use of insulin: Secondary | ICD-10-CM | POA: Diagnosis not present

## 2021-06-21 DIAGNOSIS — D638 Anemia in other chronic diseases classified elsewhere: Secondary | ICD-10-CM | POA: Diagnosis not present

## 2021-06-21 DIAGNOSIS — N3090 Cystitis, unspecified without hematuria: Secondary | ICD-10-CM | POA: Diagnosis present

## 2021-06-21 DIAGNOSIS — K2900 Acute gastritis without bleeding: Secondary | ICD-10-CM | POA: Diagnosis present

## 2021-06-21 DIAGNOSIS — D539 Nutritional anemia, unspecified: Secondary | ICD-10-CM | POA: Diagnosis present

## 2021-06-21 LAB — COMPREHENSIVE METABOLIC PANEL
ALT: 15 U/L (ref 0–44)
AST: 20 U/L (ref 15–41)
Albumin: 3.1 g/dL — ABNORMAL LOW (ref 3.5–5.0)
Alkaline Phosphatase: 54 U/L (ref 38–126)
Anion gap: 10 (ref 5–15)
BUN: 14 mg/dL (ref 8–23)
CO2: 19 mmol/L — ABNORMAL LOW (ref 22–32)
Calcium: 9.4 mg/dL (ref 8.9–10.3)
Chloride: 108 mmol/L (ref 98–111)
Creatinine, Ser: 1.04 mg/dL — ABNORMAL HIGH (ref 0.44–1.00)
GFR, Estimated: 57 mL/min — ABNORMAL LOW (ref >60)
Glucose, Bld: 206 mg/dL — ABNORMAL HIGH (ref 70–99)
Potassium: 3.5 mmol/L (ref 3.5–5.1)
Sodium: 137 mmol/L (ref 135–145)
Total Bilirubin: 0.8 mg/dL (ref 0.3–1.2)
Total Protein: 6.4 g/dL — ABNORMAL LOW (ref 6.5–8.1)

## 2021-06-21 LAB — CBC
HCT: 25.6 % — ABNORMAL LOW (ref 36.0–46.0)
Hemoglobin: 8.1 g/dL — ABNORMAL LOW (ref 12.0–15.0)
MCH: 33.5 pg (ref 26.0–34.0)
MCHC: 31.6 g/dL (ref 30.0–36.0)
MCV: 105.8 fL — ABNORMAL HIGH (ref 80.0–100.0)
Platelets: 292 10*3/uL (ref 150–400)
RBC: 2.42 MIL/uL — ABNORMAL LOW (ref 3.87–5.11)
RDW: 12.9 % (ref 11.5–15.5)
WBC: 11 10*3/uL — ABNORMAL HIGH (ref 4.0–10.5)
nRBC: 0 % (ref 0.0–0.2)

## 2021-06-21 LAB — LACTIC ACID, PLASMA: Lactic Acid, Venous: 1.9 mmol/L (ref 0.5–1.9)

## 2021-06-21 LAB — GLUCOSE, CAPILLARY
Glucose-Capillary: 101 mg/dL — ABNORMAL HIGH (ref 70–99)
Glucose-Capillary: 95 mg/dL (ref 70–99)

## 2021-06-21 LAB — CBG MONITORING, ED
Glucose-Capillary: 187 mg/dL — ABNORMAL HIGH (ref 70–99)
Glucose-Capillary: 155 mg/dL — ABNORMAL HIGH (ref 70–99)

## 2021-06-21 LAB — PHOSPHORUS: Phosphorus: 2.8 mg/dL (ref 2.5–4.6)

## 2021-06-21 LAB — TROPONIN I (HIGH SENSITIVITY)
Troponin I (High Sensitivity): 39 ng/L — ABNORMAL HIGH (ref <18)
Troponin I (High Sensitivity): 27 ng/L — ABNORMAL HIGH (ref <18)

## 2021-06-21 LAB — MAGNESIUM: Magnesium: 1.6 mg/dL — ABNORMAL LOW (ref 1.7–2.4)

## 2021-06-21 MED ORDER — METOPROLOL SUCCINATE ER 50 MG PO TB24
50.0000 mg | ORAL_TABLET | Freq: Every day | ORAL | Status: DC
  Administered 2021-06-21 – 2021-07-02 (×12): 50 mg via ORAL
  Filled 2021-06-21 (×13): qty 1

## 2021-06-21 MED ORDER — CLONIDINE HCL 0.1 MG PO TABS: 0.2000 mg | ORAL_TABLET | Freq: Two times a day (BID) | ORAL | Status: DC

## 2021-06-21 MED ORDER — CLONIDINE HCL 0.1 MG PO TABS
0.2000 mg | ORAL_TABLET | Freq: Two times a day (BID) | ORAL | Status: DC
  Administered 2021-06-21: 0.2 mg via ORAL
  Filled 2021-06-21 (×2): qty 2

## 2021-06-21 MED ORDER — FLUOXETINE HCL 20 MG PO CAPS
20.0000 mg | ORAL_CAPSULE | Freq: Every day | ORAL | Status: DC
  Administered 2021-06-22 – 2021-07-02 (×11): 20 mg via ORAL
  Filled 2021-06-21 (×12): qty 1

## 2021-06-21 MED ORDER — MINERAL OIL RE ENEM
1.0000 | ENEMA | Freq: Once | RECTAL | Status: AC
  Administered 2021-06-21: 1 via RECTAL
  Filled 2021-06-21: qty 1

## 2021-06-21 MED ORDER — ISOSORBIDE MONONITRATE ER 60 MG PO TB24
60.0000 mg | ORAL_TABLET | Freq: Every day | ORAL | Status: DC
  Administered 2021-06-21 – 2021-07-02 (×12): 60 mg via ORAL
  Filled 2021-06-21 (×13): qty 1

## 2021-06-21 MED ORDER — ASPIRIN EC 81 MG PO TBEC
81.0000 mg | DELAYED_RELEASE_TABLET | Freq: Every day | ORAL | Status: DC
  Administered 2021-06-22 – 2021-06-23 (×3): 81 mg via ORAL
  Filled 2021-06-21 (×3): qty 1

## 2021-06-21 MED ORDER — MAGNESIUM SULFATE 2 GM/50ML IV SOLN
2.0000 g | Freq: Once | INTRAVENOUS | Status: AC
  Administered 2021-06-21: 2 g via INTRAVENOUS
  Filled 2021-06-21: qty 50

## 2021-06-21 MED ORDER — ATORVASTATIN CALCIUM 40 MG PO TABS
40.0000 mg | ORAL_TABLET | Freq: Every day | ORAL | Status: DC
  Administered 2021-06-22 – 2021-07-01 (×8): 40 mg via ORAL
  Filled 2021-06-21 (×11): qty 1

## 2021-06-21 MED ORDER — LINACLOTIDE 72 MCG PO CAPS
72.0000 ug | ORAL_CAPSULE | Freq: Every day | ORAL | Status: DC
  Administered 2021-06-22 – 2021-07-02 (×7): 72 ug via ORAL
  Filled 2021-06-21 (×12): qty 1

## 2021-06-21 MED ORDER — AMLODIPINE BESYLATE 10 MG PO TABS
10.0000 mg | ORAL_TABLET | Freq: Every day | ORAL | Status: DC
  Administered 2021-06-21 – 2021-07-02 (×12): 10 mg via ORAL
  Filled 2021-06-21 (×10): qty 1
  Filled 2021-06-21: qty 2
  Filled 2021-06-21 (×2): qty 1

## 2021-06-21 MED ORDER — METOPROLOL TARTRATE 5 MG/5ML IV SOLN
5.0000 mg | Freq: Once | INTRAVENOUS | Status: AC
  Administered 2021-06-21: 5 mg via INTRAVENOUS
  Filled 2021-06-21: qty 5

## 2021-06-21 NOTE — ED Notes (Signed)
Patient did receive meal tray for lunch and has being sleeping the most of the day.  Pt is now awake and oriented but has refused her lunch tray.  Will check on her later on during the shift.

## 2021-06-21 NOTE — ED Notes (Signed)
Report sent to floor nurse.  

## 2021-06-21 NOTE — ED Notes (Signed)
Pt resting with eyes closed. Normal chest rise. Attached to cardiac monitor x3. VSS.

## 2021-06-21 NOTE — Patient Instructions (Signed)
Visit Information  PATIENT GOALS:   Goals Addressed             This Visit's Progress    COMPLETED: Find Help in My Community       Timeframe:  Short-Term Goal Priority:  High Start Date:    re-started 06/15/2021                    Expected End Date:      12/18/21           Follow Up Date 06/22/21   Begin a list or a notebook of services in my neighborhood or community Follow-up on any referrals for help I am given-including APs referral Think ahead to make sure my need does not become an emergency Develop a back-up plan    Why is this important?   Knowing how and where to find help for yourself or family in your neighborhood and community is an important skill.  You will want to take some steps to learn how.          Ms. Grider was given information about Care Management services by the embedded care coordination team including:  Care Management services include personalized support from designated clinical staff supervised by her physician, including individualized plan of care and coordination with other care providers 24/7 contact phone numbers for assistance for urgent and routine care needs. The patient may stop CCM services at any time (effective at the end of the month) by phone call to the office staff.  Patient agreed to services and verbal consent obtained.   The patient verbalized understanding of instructions, educational materials, and care plan provided today and declined offer to receive copy of patient instructions, educational materials, and care plan.   Telephone follow up appointment with care management team member scheduled for: 06/22/21  [facility], [place] [phone]
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
## 2021-06-21 NOTE — Chronic Care Management (AMB) (Signed)
Chronic Care Management    Clinical Social Work Note  06/21/2021 Name: [name] MRN: [id number] DOB: [birth year]  [name] is a 70 y.o. year old female who is a primary care patient of Jenny Reichmann, Hunt Oris, MD. The CCM team was consulted to assist the patient with chronic disease management and/or care coordination needs related to: Level of Care Concerns.   Collaboration with [facility] Department of Social Services and daughter  for follow up visit in response to provider referral for social work chronic care management and care coordination services.   Consent to Services:  The patient was given information about Chronic Care Management services, agreed to services, and gave verbal consent prior to initiation of services.  Please see initial visit note for detailed documentation.   Patient agreed to services and consent obtained.   Assessment: Review of patient past medical history, allergies, medications, and health status, including review of relevant consultants reports was performed today as part of a comprehensive evaluation and provision of chronic care management and care coordination services.     SDOH (Social Determinants of Health) assessments and interventions performed:    Advanced Directives Status: Not addressed in this encounter.  CCM Care Plan  Allergies  Allergen Reactions   Ciprofloxacin Shortness Of Breath and Other (See Comments)    Dizziness    Hydrocodone Shortness Of Breath and Other (See Comments)    Tachycardia   Hydrocortisone    Strawberry (Diagnostic) Other (See Comments)    Abdominal pain   Tomato Other (See Comments)    Abdominal pain   Penicillins Hives    Pt just remembers hives, SHE HAS HAD AUGEMTIN WITHOUT ISSUES PER PATIENT no SOB or lightheadedness with hypotension: No Has patient had a PCN reaction causing severe rash involving mucus membranes or skin necrosis: No Has patient had a PCN reaction that required hospitalization  No Has patient had a PCN reaction occurring within the last 10 years: No If all of the above answers are "NO", then may proceed with Cephalosporin use.     Facility-Administered Encounter Medications as of 06/21/2021  Medication   0.9 %  sodium chloride infusion   acetaminophen (TYLENOL) tablet 650 mg   Or   acetaminophen (TYLENOL) suppository 650 mg   amLODipine (NORVASC) tablet 10 mg   enoxaparin (LOVENOX) injection 55 mg   insulin aspart (novoLOG) injection 0-20 Units   isosorbide mononitrate (IMDUR) 24 hr tablet 60 mg   metoprolol succinate (TOPROL-XL) 24 hr tablet 50 mg   mineral oil enema 1 enema   ondansetron (ZOFRAN) tablet 4 mg   Or   ondansetron (ZOFRAN) injection 4 mg   Outpatient Encounter Medications as of 06/21/2021  Medication Sig Note   amLODipine (NORVASC) 10 MG tablet Take 1 tablet (10 mg total) by mouth daily.    aspirin 81 MG EC tablet Take 1 tablet (81 mg total) by mouth 2 (two) times daily. After one month,continue taking once a day (Patient taking differently: Take 81 mg by mouth daily.)    atorvastatin (LIPITOR) 40 MG tablet TAKE ONE TABLET BY MOUTH EVERY NIGHT AT BEDTIME (Patient taking differently: Take 40 mg by mouth at bedtime.)    cloNIDine (CATAPRES) 0.2 MG tablet Take 1 tablet (0.2 mg total) by mouth 2 (two) times daily. (Patient not taking: Reported on 06/21/2021)    Continuous Blood Gluc Receiver (FREESTYLE LIBRE 2 READER) DEVI Use as directed twice daily E11.9    Continuous Blood Gluc Sensor (FREESTYLE LIBRE 2 SENSOR)  MISC Use as directed once bi-weekly E11.9    docusate sodium (COLACE) 100 MG capsule Take 1 capsule (100 mg total) by mouth 2 (two) times daily as needed for mild constipation.    Dulaglutide (TRULICITY) 1.5 MI/6.8EH SOPN Inject 1.5 mg into the skin every Monday.    ELMIRON 100 MG capsule Take 200 mg by mouth 2 (two) times daily.  05/15/2014: .   fexofenadine (ALLEGRA) 180 MG tablet Take 1 tablet (180 mg total) by mouth daily.    FLUoxetine  (PROZAC) 20 MG capsule TAKE ONE CAPSULE BY MOUTH DAILY (Patient taking differently: Take 20 mg by mouth daily.)    glucose blood (FREESTYLE TEST STRIPS) test strip Use as instructed to check blood sugar 4 times daily.    glucose blood test strip Use as instructed    Insulin Glargine (BASAGLAR KWIKPEN) 100 UNIT/ML Inject 30 Units into the skin daily.    insulin lispro (HUMALOG) 100 UNIT/ML injection Inject 3-20 Units into the skin See admin instructions. Per sliding scale 3 times daily    isosorbide mononitrate (IMDUR) 60 MG 24 hr tablet Take 1 tablet (60 mg total) by mouth daily.    levothyroxine (SYNTHROID) 50 MCG tablet Take 1 tablet (50 mcg total) by mouth daily at 6 (six) AM.    linaclotide (LINZESS) 72 MCG capsule Take 1 capsule (72 mcg total) by mouth daily before breakfast.    losartan-hydrochlorothiazide (HYZAAR) 100-25 MG tablet Take 1 tablet by mouth daily.    Loteprednol Etabonate (LOTEMAX SM) 0.38 % GEL Place 1 drop into both eyes 3 (three) times daily. 06/21/2021: Not taking only for eye surgery last year   megestrol (MEGACE) 400 MG/10ML suspension Take 10 mLs (400 mg total) by mouth daily.    metoprolol succinate (TOPROL-XL) 50 MG 24 hr tablet TAKE 1 TABLET BY MOUTH DAILY WITH OR IMMEDIATELY FOLLOWING A MEAL (Patient taking differently: Take 50 mg by mouth daily.) 06/21/2021: Unk time   nitroGLYCERIN (NITROSTAT) 0.4 MG SL tablet Place 1 tablet (0.4 mg total) under the tongue every 5 (five) minutes as needed for chest pain.    ondansetron (ZOFRAN ODT) 8 MG disintegrating tablet 81m ODT q4 hours prn nausea (Patient taking differently: Take 8 mg by mouth every 4 (four) hours as needed for nausea.)    pantoprazole (PROTONIX) 40 MG tablet TAKE 1 TABLET(40 MG) BY MOUTH TWICE DAILY (Patient taking differently: Take 40 mg by mouth 2 (two) times daily. TAKE 1 TABLET(40 MG) BY MOUTH TWICE DAILY)    polyethylene glycol (MIRALAX / GLYCOLAX) 17 g packet Take 17 g by mouth daily as needed. (Patient  taking differently: Take 17 g by mouth daily as needed for mild constipation.)    traMADol (ULTRAM) 50 MG tablet Take 1 tablet (50 mg total) by mouth every 6 (six) hours as needed. (Patient taking differently: Take 50 mg by mouth every 6 (six) hours as needed for moderate pain.)    Vitamin D, Ergocalciferol, (DRISDOL) 1.25 MG (50000 UNIT) CAPS capsule Take 50,000 Units by mouth every 7 (seven) days.    [DISCONTINUED] enoxaparin (LOVENOX) 40 MG/0.4ML injection Inject 0.4 mLs (40 mg total) into the skin daily.     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  Acute diverticulitis 03/21/2021   Degenerative disc disease, cervical 02/08/2021   Type 2 diabetes mellitus ([place]) 02/02/2021   Acquired hypothyroidism 11/24/2020   Chronic pain 11/24/2020   Severe sepsis ([place]) 11/09/2020   Hyperlipidemia associated with type 2 diabetes mellitus ([place]) 94/76/5465   Acute metabolic encephalopathy 03/54/6568   Acute lower UTI 11/09/2020   Recurrent falls 10/19/2020   Gait disorder 10/11/2020   Fall 10/11/2020   Low grade fever 02/04/2020   Chest pain 01/26/2020   Lumbar post-laminectomy syndrome 10/05/2019   Spinal cord stimulator status 10/05/2019   Body mass index (BMI) 40.0-44.9, adult ([place]) 10/05/2019   Rectal bleeding 09/01/2019   History of peptic ulcer disease 09/01/2019   Screening for viral disease 09/01/2019   Insulin dependent type 2 diabetes mellitus ([place]) 09/01/2019   Pyelonephritis 06/05/2019   Recurrent UTI 06/05/2019   Urinary incontinence 06/05/2019   Nausea & vomiting 02/08/2019   Abdominal pain 02/08/2019   Constipation 02/08/2019   UTI (urinary tract infection) 02/08/2019   LUQ pain  01/21/2019   Cervical radiculopathy at C6 01/28/2018   Left lateral epicondylitis 01/23/2018   Obesity 01/23/2018   Weakness 05/01/2017   Hyperkalemia 05/01/2017   Acute kidney injury superimposed on CKD ([place]) 05/01/2017   Essential (primary) hypertension 05/01/2017   Sepsis ([place]) 05/01/2017   Dysuria 04/23/2017   Right leg swelling 12/27/2016   Leg pain, lateral, right 11/05/2016   Radiculopathy 04/03/2016   Hypersomnolence 11/23/2015   Right sided abdominal pain 07/25/2015   Malaise and fatigue 11/05/2014   Rotator cuff syndrome 07/26/2014   Fracture of fifth metacarpal bone of right hand 07/08/2014   Right cervical radiculopathy 06/27/2014   Chronic interstitial cystitis 05/23/2014   Osteoarthritis of CMC joint of thumb 04/11/2014   De Quervain's tenosynovitis, left 03/14/2014   CKD (chronic kidney disease), stage III ([place]) 03/03/2014   Lower back pain 03/03/2014   Ingrown nail 03/03/2014   Peripheral edema 02/23/2014   Lateral epicondylitis of right elbow 01/31/2014   Neck pain on left side 11/19/2013   Abdominal discomfort 10/12/2012   Right lumbar radiculopathy 05/11/2012   Peripheral neuropathy 05/11/2012   Vertigo 03/29/2012   Headache(784.0) 03/10/2012   Left lower quadrant abdominal pain 10/24/2011   Lumbar radiculopathy 10/24/2011   Hypothyroidism 08/19/2011   Right leg weakness 04/04/2011   Hematochezia 01/07/2011   Preventative health care 01/07/2011   CHOLELITHIASIS 05/04/2009   GERD 04/26/2009   Gastroparesis 04/26/2009   ULCER-GASTRIC 03/15/2009   DIVERTICULOSIS-COLON 03/15/2009   Cervical radiculopathy 10/28/2008   MENOPAUSAL DISORDER 06/29/2008   Pain in joint, shoulder region 10/12/2007   INSOMNIA-SLEEP DISORDER-UNSPEC 09/01/2007   Depression 09/01/2007   Macrocytic anemia 08/13/2007   COMMON MIGRAINE 08/13/2007   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   Diabetes ([place]) 05/06/2007   HLD (hyperlipidemia) 05/06/2007   ANXIETY 05/06/2007    Hypertension associated with diabetes ([place]) 05/06/2007   Coronary atherosclerosis 05/06/2007    Conditions to be addressed/monitored: HTN, DMII, and Generalized Weakness ; ADL IADL limitations  Care Plan : General Social Work (Adult)  Updates made by [employer], Baruch Gouty M, LCSW since 06/21/2021 12:00 AM     Problem: CHL AMB "PATIENT-SPECIFIC PROBLEM"   Note:   Current barriers:   Patient in need of assistance with connecting to community resources for ADL IADL limitations Acknowledges deficits with meeting this unmet need Patient is unable to independently navigate community resource options without care coordination support Clinical Goals:  explore community resource options for unmet needs related to:Inability to perform IADL's independently  and Inability to perform ADL's independently and Family and relationship dysfunction  Clinical Interventions:  Collaboration with Biagio Borg, MD regarding development and update of comprehensive plan of care as evidenced by provider attestation and co-signature Inter-disciplinary care team collaboration (see longitudinal plan of care) Assessment of needs completed, barriers and agencies contacted discussed with patient's daughter Patient's daughter discussed concerns that patient's needs are not being met in the home of her current spouse-she discussed history of patient's spouse refusing care for patient and may lack the general capacity to meet her medical needs Patient's daughter confirmed plan of patient coming to reside with her following her SNF stay, however spouse picked her up from an appointment and brought her to his home [facility] services arranged through [place] to start 06/21/21 Per patient's daughter APS has been contacted by patient's daughter in response to her concerns and has not been contacted for follow up yet-her plan is to completed a follow up call today-(case went to [facility] as patient's daughter works for [employer] and follow up there would be a conflict of interest Encouraged patient's daughter to follow up with APS regarding referral previously made Other interventions provided: Active listening / Reflection utilized  Emotional Support Provided Caregiver stress acknowledged  06/21/21 Update Phone call to [place] to confirm that report was Carola Rhine confirmed with Murlean Caller additional information provided as this social worker did not make original report-report received 06/19/21 Phone call from patient's daughter who confirmed plan for patient to return to her home , however patient now hospitalized-police involvement required to retrieve patient's belongings . Initiating discussions to determine discharge plan suggested due to APS involvement and concerns regarding spouses ability to provide care Patient Goals:  -  Follow Up Plan: SW will follow up with patient by phone over the next 7 business days        Follow Up Plan: SW will follow up with patient by phone over the next 5-7 business days      [place], [place] [phone]

## 2021-06-21 NOTE — ED Notes (Signed)
Verified with hospitalist pt to receive Imdur, Metoprolol, and Norvasc. Relayed to floor nurse, Warden Fillers, RN., these medications still need to be given when pt arrives to floor.
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
## 2021-06-21 NOTE — Progress Notes (Signed)
Pt arrived to floor from ED on stretcher, slid to bed with 3 max assist. VSS. Pt oriented to callbell and environment. POC discussed. Pt alert, oriented to self, disoriented to year, month, place, situation. Follows simple commands. Dr Lonny Prude made aware of elevated trop at [phone], new order for repeat trop level noted.
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
## 2021-06-21 NOTE — ED Notes (Signed)
Pt awake and alert and oriented to person and place alone. Attached to cardiac monitor x3. VSS at this time. Pt declines breakfast tray at this time stating, "I'm just not ready to eat yet."
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
## 2021-06-22 DIAGNOSIS — N1831 Chronic kidney disease, stage 3a: Secondary | ICD-10-CM | POA: Diagnosis not present

## 2021-06-22 DIAGNOSIS — I10 Essential (primary) hypertension: Secondary | ICD-10-CM

## 2021-06-22 DIAGNOSIS — E872 Acidosis: Secondary | ICD-10-CM | POA: Diagnosis not present

## 2021-06-22 DIAGNOSIS — Z794 Long term (current) use of insulin: Secondary | ICD-10-CM

## 2021-06-22 DIAGNOSIS — E119 Type 2 diabetes mellitus without complications: Secondary | ICD-10-CM

## 2021-06-22 DIAGNOSIS — E039 Hypothyroidism, unspecified: Secondary | ICD-10-CM

## 2021-06-22 LAB — GLUCOSE, CAPILLARY
Glucose-Capillary: 149 mg/dL — ABNORMAL HIGH (ref 70–99)
Glucose-Capillary: 170 mg/dL — ABNORMAL HIGH (ref 70–99)
Glucose-Capillary: 140 mg/dL — ABNORMAL HIGH (ref 70–99)

## 2021-06-22 MED ORDER — MEGESTROL ACETATE 400 MG/10ML PO SUSP
400.0000 mg | Freq: Every day | ORAL | Status: DC
  Administered 2021-06-23 – 2021-07-02 (×8): 400 mg via ORAL
  Filled 2021-06-22 (×11): qty 10

## 2021-06-22 MED ORDER — SODIUM CHLORIDE 0.45 % IV SOLN: INTRAVENOUS | Status: DC

## 2021-06-22 MED ORDER — LEVOTHYROXINE SODIUM 50 MCG PO TABS
50.0000 ug | ORAL_TABLET | Freq: Every day | ORAL | Status: DC
  Administered 2021-06-23 – 2021-07-02 (×8): 50 ug via ORAL
  Filled 2021-06-22 (×10): qty 1

## 2021-06-22 MED ORDER — INSULIN GLARGINE-YFGN 100 UNIT/ML ~~LOC~~ SOLN
10.0000 [IU] | Freq: Every day | SUBCUTANEOUS | Status: DC
  Administered 2021-06-22 – 2021-07-02 (×11): 10 [IU] via SUBCUTANEOUS
  Filled 2021-06-22 (×11): qty 0.1

## 2021-06-22 NOTE — Progress Notes (Addendum)
PROGRESS NOTE    [name]  [id number] DOB: Oct 15, 1950 DOA: 07/01/2021 PCP: Biagio Borg, MD   Brief Narrative: [name] is a 70 y.o. female with a history of anxiety, depression, osteoarthritis, chronic back pain, CKD stage IIIa, migraines, CAD, diabetes mellitus, PUD with gastric ulcer, gastroparesis, GERD, hyperlipidemia, hypothyroidism, anemia. Patient presented secondary to tachycardia referred by her PCP. She was found to have evidence of volume depletion and was given IV fluids for resuscitation.    Assessment & Plan:   Principal Problem:   Lactic acidosis Active Problems:   HLD (hyperlipidemia)   Macrocytic anemia   Depression   Coronary atherosclerosis   GERD   Hypothyroidism   CKD (chronic kidney disease), stage III (HCC)   Essential (primary) hypertension   Insulin dependent type 2 diabetes mellitus (HCC)   Leukocytosis   Generalized weakness   Tachycardia   Volume depletion Secondary to poor oral intake. Appears to have resolved with IV fluids.  Lactic acidosis Secondary to above/dehydration. Resolved with IV fluids.  Tachycardia Secondary to dehydration. Resolved with IV fluids.  Confusion Patient appears to have cognitive impairment. No dementia listed in history, however there was a concern for developing dementia from one month prior. Cannot confirm mental status with family as I cannot reach her daughter for the past two days. Urinalysis unremarkable. Blood culture with no growth to date -MRI brain since unable to confirm patient's baseline and per note from recent hospitalization.  Chest pain Atypical. Troponin mildly elevated with downward trend. No longer having chest pain. EKG without features consistent with ACS. Resolved.  Leukocytosis Mild. No obvious evidence of infection.  Generalized weakness Unsure of baseline. -PT/OT: SNF  Diabetes mellitus, type 2 Patient is prescribed Basaglar 30 units daily, Humalog sliding  scale TID, Trulicity as an outpatient. Started on SSI on admission. Blood sugar currently adequate -Continue SSI -Start Semglee 10 units daily  Macrocytic anemia Baseline hemoglobin of about 9. Slightly down today to 8.1. No evidence of bleeding -CBC in AM  Depression Anxiety -Continue home Prozac  CAD -Continue Aspirin  CKD stage IIIa Stable.  Primary hypertension Uncontrolled secondary to home medications held -Continue home metoprolol, amlodipine, Imdur -Hold losartan/hydrochlorothiazide   Constipation Patient with evidence of a rectal stool ball. Manual disimpaction failed. Patient produced a bowel movement with enema.  GERD -Continue Protonix  Hypothyroidism -Synthroid 50 mcg  Social issues Per chart, patient's daughter/HCPOA is concerned about patient mistreatment at home. TOC is consulted.   DVT prophylaxis: Lovenox Code Status:   Code Status: Full Code Family Communication: None at bedside. Called daughter three times but no answer Disposition Plan: Discharge to SNF pending workup of mental status change in addition to [facility] follow-up with regard to social/home situation   Consultants:  None  Procedures:  None  Antimicrobials: None    Subjective: No issues overnight. Wants to go home.  Objective: Vitals:   06/21/21 1700 06/22/21 0136 06/22/21 0654 06/22/21 1133  BP: 117/69 (!) 146/67  128/60  Pulse: 96 93  81  Resp: '18 20  18  '$ Temp: 98.4 F (36.9 C) 98.7 F (37.1 C) 99.7 F (37.6 C) 98 F (36.7 C)  TempSrc: Oral Axillary Oral   SpO2: 96% 96%  100%  Weight:      Height:        Intake/Output Summary (Last 24 hours) at 06/22/2021 1536 Last data filed at 06/22/2021 1219 Gross per 24 hour  Intake 510 ml  Output 400 ml  Net  110 ml    Filed Weights   07/04/2021 1800 06/21/21 1101 06/21/21 1403  Weight: 108.9 kg 108.9 kg 110.8 kg    Examination:  General exam: Appears calm and comfortable Respiratory system: Clear to auscultation.  Respiratory effort normal. Cardiovascular system: S1 & S2 heard, RRR. No murmurs, rubs, gallops or clicks. Gastrointestinal system: Abdomen is nondistended, soft and nontender. No organomegaly or masses felt. Normal bowel sounds heard. Central nervous system: Alert and oriented to self only. Thinks she is 89 today but knew she has a husband, [name] and a daughter, [name] Musculoskeletal: No edema. No calf tenderness Skin: No cyanosis. No rashes Psychiatry: Judgement and insight appear impaired. Mood & affect appropriate.     Data Reviewed: I have personally reviewed following labs and imaging studies  CBC Lab Results  Component Value Date   WBC 11.0 (H) 06/21/2021   RBC 2.42 (L) 06/21/2021   HGB 8.1 (L) 06/21/2021   HCT 25.6 (L) 06/21/2021   MCV 105.8 (H) 06/21/2021   MCH 33.5 06/21/2021   PLT 292 06/21/2021   MCHC 31.6 06/21/2021   RDW 12.9 06/21/2021   LYMPHSABS 1.4 06/09/2021   MONOABS 0.7 06/28/2021   EOSABS 0.0 06/26/2021   BASOSABS 0.0 [id number]     Last metabolic panel Lab Results  Component Value Date   NA 137 06/21/2021   K 3.5 06/21/2021   CL 108 06/21/2021   CO2 19 (L) 06/21/2021   BUN 14 06/21/2021   CREATININE 1.04 (H) 06/21/2021   GLUCOSE 206 (H) 06/21/2021   GFRNONAA 57 (L) 06/21/2021   GFRAA 33 (L) 01/25/2020   CALCIUM 9.4 06/21/2021   PHOS 2.8 06/21/2021   PROT 6.4 (L) 06/21/2021   ALBUMIN 3.1 (L) 06/21/2021   BILITOT 0.8 06/21/2021   ALKPHOS 54 06/21/2021   AST 20 06/21/2021   ALT 15 06/21/2021   ANIONGAP 10 06/21/2021    CBG (last 3)  Recent Labs    06/21/21 2214 06/22/21 0735 06/22/21 1131  GLUCAP 95 149* 170*      GFR: Estimated Creatinine Clearance: 61.5 mL/min (A) (by C-G formula based on SCr of 1.04 mg/dL (H)).  Coagulation Profile: Recent Labs  Lab 06/17/2021 0054  INR 1.2     Recent Results (from the past 240 hour(s))  Resp Panel by RT-PCR (Flu A&B, Covid) Nasopharyngeal Swab     Status: None   Collection Time:  06/12/21  6:45 PM   Specimen: Nasopharyngeal Swab; Nasopharyngeal(NP) swabs in vial transport medium  Result Value Ref Range Status   SARS Coronavirus 2 by RT PCR NEGATIVE NEGATIVE Final    Comment: (NOTE) SARS-CoV-2 target nucleic acids are NOT DETECTED.  The SARS-CoV-2 RNA is generally detectable in upper respiratory specimens during the acute phase of infection. The lowest concentration of SARS-CoV-2 viral copies this assay can detect is 138 copies/mL. A negative result does not preclude SARS-Cov-2 infection and should not be used as the sole basis for treatment or other patient management decisions. A negative result may occur with  improper specimen collection/handling, submission of specimen other than nasopharyngeal swab, presence of viral mutation(s) within the areas targeted by this assay, and inadequate number of viral copies(<138 copies/mL). A negative result must be combined with clinical observations, patient history, and epidemiological information. The expected result is Negative.  Fact Sheet for Patients:  EntrepreneurPulse.com.au  Fact Sheet for Healthcare Providers:  IncredibleEmployment.be  This test is no t yet approved or cleared by the [place] and  has been authorized for  detection and/or diagnosis of SARS-CoV-2 by FDA under an Emergency Use Authorization (EUA). This EUA will remain  in effect (meaning this test can be used) for the duration of the COVID-19 declaration under Section 564(b)(1) of the Act, 21 U.S.C.section 360bbb-3(b)(1), unless the authorization is terminated  or revoked sooner.       Influenza A by PCR NEGATIVE NEGATIVE Final   Influenza B by PCR NEGATIVE NEGATIVE Final    Comment: (NOTE) The Xpert Xpress SARS-CoV-2/FLU/RSV plus assay is intended as an aid in the diagnosis of influenza from Nasopharyngeal swab specimens and should not be used as a sole basis for treatment. Nasal washings  and aspirates are unacceptable for Xpert Xpress SARS-CoV-2/FLU/RSV testing.  Fact Sheet for Patients: EntrepreneurPulse.com.au  Fact Sheet for Healthcare Providers: IncredibleEmployment.be  This test is not yet approved or cleared by the [place] FDA and has been authorized for detection and/or diagnosis of SARS-CoV-2 by FDA under an Emergency Use Authorization (EUA). This EUA will remain in effect (meaning this test can be used) for the duration of the COVID-19 declaration under Section 564(b)(1) of the Act, 21 U.S.C. section 360bbb-3(b)(1), unless the authorization is terminated or revoked.  Performed at [facility], [place] [address] [place], [place] [address]   Culture, blood (Routine x 2)     Status: None (Preliminary result)   Collection Time: 06/12/2021 12:54 AM   Specimen: BLOOD RIGHT HAND  Result Value Ref Range Status   Specimen Description   Final    BLOOD RIGHT HAND Performed at [facility] [address], [place], [place] [address]    Special Requests   Final    BOTTLES DRAWN AEROBIC AND ANAEROBIC Blood Culture results may not be optimal due to an inadequate volume of blood received in culture bottles Performed at [facility] [address], [place], [place] [address]    Culture   Final    NO GROWTH 2 DAYS Performed at [facility] [address], [place], [place] [address]    Report Status PENDING  Incomplete  Culture, blood (Routine x 2)     Status: None (Preliminary result)   Collection Time: 06/09/2021  9:00 PM   Specimen: BLOOD  Result Value Ref Range Status   Specimen Description   Final    BLOOD BLOOD LEFT HAND Performed at [facility] [address] [place], [place] [address]    Special Requests   Final    BOTTLES DRAWN AEROBIC AND ANAEROBIC Blood Culture adequate volume Performed at [facility]  [address] [place], [place] [address]    Culture   Final    NO GROWTH 2 DAYS Performed at [facility] [address] [place], [place] [address]    Report Status PENDING  Incomplete  Culture, blood (routine x 2)     Status: None (Preliminary result)   Collection Time: 06/19/2021  9:00 PM   Specimen: BLOOD  Result Value Ref Range Status   Specimen Description   Final    BLOOD BLOOD RIGHT HAND Performed at [facility] [address], [place], [place] [address]    Special Requests   Final    BOTTLES DRAWN AEROBIC AND ANAEROBIC Blood Culture adequate volume Performed at [facility] [address] [place], [place] [address]    Culture   Final    NO GROWTH 2 DAYS Performed at [facility]  [address], [place], [place] [address]    Report Status PENDING  Incomplete         Radiology Studies: CT Abdomen Pelvis W Contrast  Result Date: 06/08/2021 CLINICAL DATA:  Abdominal abscess/infection suspected EXAM: CT ABDOMEN AND PELVIS WITH CONTRAST TECHNIQUE: Multidetector CT imaging of the abdomen and pelvis was performed using the standard protocol following bolus administration of intravenous contrast. CONTRAST:  43m OMNIPAQUE IOHEXOL 350 MG/ML SOLN COMPARISON:  CT abdomen pelvis 05/17/2021 FINDINGS: Lower chest: No acute abnormality. Hepatobiliary: No focal liver abnormality. Status post cholecystectomy. No biliary dilatation. Pancreas: No focal lesion. Normal pancreatic contour. No surrounding inflammatory changes. No main pancreatic ductal dilatation. Spleen: Normal in size without focal abnormality. Adrenals/Urinary Tract: No adrenal nodule bilaterally. Bilateral kidneys enhance symmetrically. No hydronephrosis. No hydroureter. Foci of gas within the urinary bladder lumen. Otherwise the urinary bladder is unremarkable. On delayed imaging, there is no urothelial wall thickening and there are no filling defects in the opacified portions  of the bilateral collecting systems or ureters. Stomach/Bowel: Gastric surgical changes noted. Stomach is within normal limits. No evidence of bowel wall thickening or dilatation. 8 cm stool ball within the rectum. Scattered colonic diverticulosis. Stool throughout the majority of the colon. Appendix appears normal. Vascular/Lymphatic: No abdominal aorta or iliac aneurysm. Mild atherosclerotic plaque of the aorta and its branches. No abdominal, pelvic, or inguinal lymphadenopathy. Reproductive: Status post hysterectomy. No adnexal masses. Other: No intraperitoneal free fluid. No intraperitoneal free gas. No organized fluid collection. Musculoskeletal: Similar-appearing left lumbar hernia with an abdominal defect of 1.9 cm with a short segment of small bowel herniating through. Redemonstration of a small fat containing left inguinal hernia. Question tiny supraumbilical ventral wall fat containing hernia (2:40, 5:123). Left flank subcutaneus soft tissue neurostimulator with leads entering the central canal at the L1 vertebral body and extending superiorly. Tip not visualized and collimated off view. No suspicious lytic or blastic osseous lesions. No acute displaced fracture. Multilevel degenerative changes of the spine. L3 through L5 posterior and interbody fusion. IMPRESSION: 1. Foci of gas within the urinary bladder lumen. Otherwise unremarkable urinary bladder. Recommend correlation with recent instrumentation. Correlate with urinalysis if clinically indicated. 2. Left lumbar hernia now containing a short loop of small bowel. Small fat containing left inguinal hernia. Question tiny supraumbilical ventral wall fat containing hernia. No findings to suggest associated ischemia or bowel obstruction. 3. An 8 cm stool ball within the proximal to mid rectum with no findings of stercoral colitis. Stool throughout the majority of the colon. 4. Scattered colonic diverticulosis with no acute diverticulitis. 5.  Aortic  Atherosclerosis (ICD10-I70.0). Electronically Signed   By: MIven FinnM.D.   On: 06/25/2021 21:42   DG Chest Port 1 View  Result Date: 06/17/2021 CLINICAL DATA:  Tachycardia EXAM: PORTABLE CHEST 1 VIEW COMPARISON:  Chest x-ray 06/19/2021 FINDINGS: The heart and mediastinal contours are unchanged. Aortic calcification. No focal consolidation. No pulmonary edema. No pleural effusion. No pneumothorax. Neurostimulator leads again noted overlying the mid to lower thoracic spine. Degenerative changes of the shoulders. No acute osseous abnormality. IMPRESSION: No active disease. Electronically Signed   By: MIven FinnM.D.   On: 06/07/2021 18:41        Scheduled Meds:  amLODipine  10 mg Oral Daily   aspirin EC  81 mg Oral Daily   atorvastatin  40 mg Oral QHS   enoxaparin (LOVENOX) injection  55 mg Subcutaneous Q24H   FLUoxetine  20 mg Oral Daily   insulin aspart  0-20  Units Subcutaneous TID WC   isosorbide mononitrate  60 mg Oral Daily   [START ON 06/23/2021] levothyroxine  50 mcg Oral Q0600   linaclotide  72 mcg Oral QAC breakfast   metoprolol succinate  50 mg Oral Daily   Continuous Infusions:     LOS: 1 day     Cordelia Poche, MD [facility] 06/22/2021, 3:36 PM  If 7PM-7AM, please contact night-coverage [email or web address]

## 2021-06-22 NOTE — Progress Notes (Signed)
Pt refused to open mouth for evening medications.  Two attempts made.  Third attempt successful for two of the medications, but pt refused third.    Will continue to monitor.

## 2021-06-22 NOTE — Consult Note (Signed)
[facility] CM Inpatient Consult   06/22/2021  [name] 11-16-1950 [id number]  [facility] [[facility]] Patient   Patient noted unplanned 30 day readmission and extreme high risk score for unplanned readmission. Patient active with chronic case management services at primary care office with RN case manager and LCSW for ongoing needs.   Plan: Will continue to follow for progression and disposition.  Of note, [facility] Care Management services does not replace or interfere with any services that are arranged by inpatient case management or social work.   Netta Cedars, MSN, [facility] Liaison Nurse Mobile Phone [phone]  Toll free office [phone]

## 2021-06-22 NOTE — Progress Notes (Signed)
Pt did not void /wet the pad all day. Bladder scan only showed 104 ml. MD notified. Will start on IVF.

## 2021-06-22 NOTE — TOC Initial Note (Signed)
Transition of Care [facility]) - Initial/Assessment Note    Patient Details  Name: [name] MRN: [id number] Date of Birth: 10-30-1950  [facility]) CM/SW Contact:    Dessa Phi, RN Phone Number: 06/22/2021, 1:37 PM  Clinical Narrative:  Patient confused-spoke to dtr Chrystal(HPOA) about d/c plans-d/c plan LTC if she has a medicaid # by d/c date to fax to LTC facilities-if not d/c home w/active [facility] for HHRN/PT/OT/aide/csw rep Ramond Marrow aware;Confirmed [facility] has referral 06/19/21, will provide outpatient mental health resources;PTAR for transport home to dtr-confirmed address on face sheet.Patient is already w/c bound prior to admission.                  Expected Discharge Plan: [place] Barriers to Discharge: Continued Medical Work up   Patient Goals and CMS Choice Patient states their goals for this hospitalization and ongoing recovery are:: LTC rehab CMS Medicare.gov Compare Post Acute Care list provided to:: Patient Represenative (must comment) (Chrystal dtr [phone]) Choice offered to / list presented to : Adult Children  Expected Discharge Plan and Services Expected Discharge Plan: [place]   Discharge Planning Services: CM Consult Post Acute Care Choice: [place] arrangements for the past 2 months: Single Family Home                           HH Arranged: RN, PT, OT, Nurse's Aide, Social Work [employer] Agency: [place] ([place]) Date [place]: 06/22/21 Time [place]: 52 Representative spoke with at [place]: [place] Arrangements/Services Living arrangements for the past 2 months: [place] Lives with:: Adult Children Patient language and need for interpreter reviewed:: Yes Do you feel safe going back to the place where you live?: Yes      Need for Family Participation in Patient Care: No (Comment) Care giver support system in place?:  Yes (comment) Current home services: DME (hoyer lift,w/c) Criminal Activity/Legal Involvement Pertinent to Current Situation/Hospitalization: No - Comment as needed  Activities of Daily Living Home Assistive Devices/Equipment: Eyeglasses, Other (Comment), Walker (specify type), Wheelchair, [employer], Bedside commode/3-in-1, Hand-held shower hose, Radio producer (specify quad or straight), Blood pressure cuff ADL Screening (condition at time of admission) Patient's cognitive ability adequate to safely complete daily activities?: Yes Is the patient deaf or have difficulty hearing?: No Does the patient have difficulty seeing, even when wearing glasses/contacts?: No Does the patient have difficulty concentrating, remembering, or making decisions?: Yes Patient able to express need for assistance with ADLs?: Yes Does the patient have difficulty dressing or bathing?: Yes Independently performs ADLs?: No Communication: Independent Dressing (OT): Needs assistance Is this a change from baseline?: Pre-admission baseline Grooming: Needs assistance Is this a change from baseline?: Pre-admission baseline Feeding: Needs assistance Is this a change from baseline?: Pre-admission baseline Bathing: Needs assistance Is this a change from baseline?: Pre-admission baseline Toileting: Dependent Is this a change from baseline?: Pre-admission baseline In/Out Bed: Dependent Is this a change from baseline?: Pre-admission baseline Walks in Home: Dependent (patient is wheelchair bound) Is this a change from baseline?: Pre-admission baseline Does the patient have difficulty walking or climbing stairs?: Yes (secondary to weakness) Weakness of Legs: Both Weakness of Arms/Hands: None  Permission Sought/Granted Permission sought to share information with : Case Manager Permission granted to share information with : Yes, Verbal Permission Granted  Share Information with NAME: Case Manager  Permission granted to share  info w Relationship: Chrystal dtr [phone]     Emotional Assessment Appearance:: Appears stated age Attitude/Demeanor/Rapport: Gracious Affect (typically observed): Accepting Orientation: : Oriented to Self Alcohol / Substance Use: Not Applicable Psych Involvement: No (comment)  Admission diagnosis:  Lactic acidosis [E87.2] Tachycardia [R00.0] Constipation, unspecified constipation type [K59.00] Patient Active Problem List   Diagnosis Date Noted   Tachycardia 07/06/2021   Lactic acidosis 07/04/2021   Cognitive disorder 05/21/2021   Generalized weakness 05/17/2021   Femur fracture ([place]) 05/01/2021   Bilateral hand pain 05/01/2021   Weight loss 04/15/2021   Leukocytosis 04/15/2021   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   Acute diverticulitis 03/21/2021   Degenerative disc disease, cervical 02/08/2021   Type 2 diabetes mellitus ([place]) 02/02/2021   Acquired hypothyroidism 11/24/2020   Chronic pain 11/24/2020   Severe sepsis ([place]) 11/09/2020   Hyperlipidemia associated with type 2 diabetes mellitus ([place]) 99991111   Acute metabolic encephalopathy [id number]   Acute lower UTI 11/09/2020   Recurrent falls 10/19/2020   Gait disorder 10/11/2020   Fall 10/11/2020   Low grade fever 02/04/2020   Chest pain 01/26/2020   Lumbar post-laminectomy syndrome 10/05/2019   Spinal cord stimulator status 10/05/2019   Body mass index (BMI) 40.0-44.9, adult ([place]) 10/05/2019   Rectal bleeding 09/01/2019   History of peptic ulcer disease 09/01/2019   Screening for viral disease 09/01/2019   Insulin dependent type 2 diabetes mellitus ([place]) 09/01/2019   Pyelonephritis 06/05/2019   Recurrent UTI 06/05/2019   Urinary incontinence 06/05/2019   Nausea & vomiting 02/08/2019   Abdominal pain 02/08/2019   Constipation 02/08/2019   UTI (urinary tract infection) 02/08/2019   LUQ pain 01/21/2019   Cervical radiculopathy at C6 01/28/2018   Left lateral epicondylitis 01/23/2018   Obesity  01/23/2018   Weakness 05/01/2017   Hyperkalemia 05/01/2017   Acute kidney injury superimposed on CKD ([place]) 05/01/2017   Essential (primary) hypertension 05/01/2017   Sepsis ([place]) 05/01/2017   Dysuria 04/23/2017   Right leg swelling 12/27/2016   Leg pain, lateral, right 11/05/2016   Radiculopathy 04/03/2016   Hypersomnolence 11/23/2015   Right sided abdominal pain 07/25/2015   Malaise and fatigue 11/05/2014   Rotator cuff syndrome 07/26/2014   Fracture of fifth metacarpal bone of right hand 07/08/2014   Right cervical radiculopathy 06/27/2014   Chronic interstitial cystitis 05/23/2014   Osteoarthritis of CMC joint of thumb 04/11/2014   De Quervain's tenosynovitis, left 03/14/2014   CKD (chronic kidney disease), stage III ([place]) 03/03/2014   Lower back pain 03/03/2014   Ingrown nail 03/03/2014   Peripheral edema 02/23/2014   Lateral epicondylitis of right elbow 01/31/2014   Neck pain on left side 11/19/2013   Abdominal discomfort 10/12/2012   Right lumbar radiculopathy 05/11/2012   Peripheral neuropathy 05/11/2012   Vertigo 03/29/2012   Headache(784.0) 03/10/2012   Left lower quadrant abdominal pain 10/24/2011   Lumbar radiculopathy 10/24/2011   Hypothyroidism 08/19/2011   Right leg weakness 04/04/2011   Hematochezia 01/07/2011   Preventative health care 01/07/2011   CHOLELITHIASIS 05/04/2009   GERD 04/26/2009   Gastroparesis 04/26/2009   ULCER-GASTRIC 03/15/2009   DIVERTICULOSIS-COLON 03/15/2009   Cervical radiculopathy 10/28/2008   MENOPAUSAL DISORDER 06/29/2008   Pain in joint, shoulder region 10/12/2007   INSOMNIA-SLEEP DISORDER-UNSPEC 09/01/2007   Depression 09/01/2007   Macrocytic anemia 08/13/2007   COMMON MIGRAINE 08/13/2007   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 [place] [address] Phone: [phone] Fax: [place] ([place]), [place] - [address] [place] [address] Phone: [phone] Fax: [place] [address] [place] [address] Phone: [phone] Fax: [phone]     Social Determinants of Health (SDOH) Interventions    Readmission Risk Interventions Readmission Risk Prevention Plan 04/24/2021  Transportation Screening Complete  PCP or Specialist appointment within 3-5 days of discharge Complete  HRI or [place] Complete  SW Recovery Care/Counseling Consult Complete  [place] Not Applicable  Some recent data might be hidden

## 2021-06-23 DIAGNOSIS — E872 Acidosis: Secondary | ICD-10-CM | POA: Diagnosis not present

## 2021-06-23 LAB — BASIC METABOLIC PANEL
Anion gap: 7 (ref 5–15)
BUN: 17 mg/dL (ref 8–23)
CO2: 20 mmol/L — ABNORMAL LOW (ref 22–32)
Calcium: 8.9 mg/dL (ref 8.9–10.3)
Chloride: 111 mmol/L (ref 98–111)
Creatinine, Ser: 1.16 mg/dL — ABNORMAL HIGH (ref 0.44–1.00)
GFR, Estimated: 50 mL/min — ABNORMAL LOW (ref >60)
Glucose, Bld: 128 mg/dL — ABNORMAL HIGH (ref 70–99)
Potassium: 3.5 mmol/L (ref 3.5–5.1)
Sodium: 138 mmol/L (ref 135–145)

## 2021-06-23 LAB — CBC
HCT: 20.1 % — ABNORMAL LOW (ref 36.0–46.0)
Hemoglobin: 6.4 g/dL — CL (ref 12.0–15.0)
MCH: 33.7 pg (ref 26.0–34.0)
MCHC: 31.8 g/dL (ref 30.0–36.0)
MCV: 105.8 fL — ABNORMAL HIGH (ref 80.0–100.0)
Platelets: 290 10*3/uL (ref 150–400)
RBC: 1.9 MIL/uL — ABNORMAL LOW (ref 3.87–5.11)
RDW: 13.2 % (ref 11.5–15.5)
WBC: 10.8 10*3/uL — ABNORMAL HIGH (ref 4.0–10.5)
nRBC: 0.5 % — ABNORMAL HIGH (ref 0.0–0.2)

## 2021-06-23 LAB — GLUCOSE, CAPILLARY
Glucose-Capillary: 139 mg/dL — ABNORMAL HIGH (ref 70–99)
Glucose-Capillary: 132 mg/dL — ABNORMAL HIGH (ref 70–99)
Glucose-Capillary: 125 mg/dL — ABNORMAL HIGH (ref 70–99)
Glucose-Capillary: 136 mg/dL — ABNORMAL HIGH (ref 70–99)
Glucose-Capillary: 195 mg/dL — ABNORMAL HIGH (ref 70–99)

## 2021-06-23 LAB — MAGNESIUM: Magnesium: 1.9 mg/dL (ref 1.7–2.4)

## 2021-06-23 LAB — PREPARE RBC (CROSSMATCH)

## 2021-06-23 MED ORDER — MELATONIN 5 MG PO TABS
5.0000 mg | ORAL_TABLET | Freq: Every evening | ORAL | Status: DC | PRN
  Administered 2021-06-24 – 2021-07-01 (×6): 5 mg via ORAL
  Filled 2021-06-23 (×7): qty 1

## 2021-06-23 MED ORDER — POLYETHYLENE GLYCOL 3350 17 G PO PACK
17.0000 g | PACK | Freq: Every day | ORAL | Status: DC
  Administered 2021-06-23 – 2021-07-02 (×9): 17 g via ORAL
  Filled 2021-06-23 (×9): qty 1

## 2021-06-23 MED ORDER — LORAZEPAM 2 MG/ML IJ SOLN
0.5000 mg | Freq: Once | INTRAMUSCULAR | Status: DC
Start: 2021-06-24 — End: 2021-06-29

## 2021-06-23 MED ORDER — SODIUM CHLORIDE 0.9% IV SOLUTION: Freq: Once | INTRAVENOUS | Status: AC

## 2021-06-23 NOTE — Progress Notes (Signed)
Patient refused all medications despite several attempts made by family and nurse. Will continue to monitor patient closely.

## 2021-06-23 NOTE — Progress Notes (Signed)
PROGRESS NOTE    [name]  [id number] DOB: 1951/01/16 DOA: 06/18/2021 PCP: Biagio Borg, MD   Chief Complain: Fast heartbeat.  Brief Narrative: Patient is a 70 year old female with history of anxiety, depression, osteoarthritis, chronic back pain, CKD stage IIIa, coronary disease, diabetes type 2, peptic ulcer disease with gastric ulcer, gastroparesis, hyperlipidemia, hypothyroidism, chronic anemia who presented to the emergency department after she was referred by her PCP for the evaluation of fast heartbeat.  On presentation she looked dehydrated and was started on IV fluids.  Hospital course remarkable for drop in the hemoglobin in the range of 6.  Being transfused with a unit of PRBC.  Assessment & Plan:   Principal Problem:   Lactic acidosis Active Problems:   HLD (hyperlipidemia)   Macrocytic anemia   Depression   Coronary atherosclerosis   GERD   Hypothyroidism   CKD (chronic kidney disease), stage III (HCC)   Essential (primary) hypertension   Insulin dependent type 2 diabetes mellitus (HCC)   Leukocytosis   Generalized weakness   Tachycardia   Acute on chronic macrocytic anemia: Her baseline hemoglobin is in the range of 8-10.  Hemoglobin suddenly dropped from 9 to 6.4 today.  No evidence of acute blood loss.  She has chronic macrocytosis.  We will check FOBT,.will check iron level. Patient is a very poor historian so we are not sure whether she passed melena or bloody stools.  Denies any abdominal pain.  Vitamin [id number] and folic acid level checked on 05/18/2021 are normal.  She is being transfused with a unit of PRBC today.  Check CBC tomorrow Acute drop in the hemoglobin could also be from hemodilution from IV fluids.  Tachycardia: Main reason for presenting to the emergency department.  Currently she is in normal sinus rhythm.  Not tachycardic.  Suspected to be dehydrated on presentation.  Lactic acidosis: Resolved with IV fluids  Confusion: Patient has  cognitive impairment at baseline now for a while.  No documented history of dementia.  Urinalysis did not show any signs of UTI.  MRI of the brain has been ordered.  We will follow-up. Extensive work-up was done about her encephalopathy when she was admitted on July 2022 at [facility].  At that time MRI was not done due to presence of neurostimulator in her back.  Neurology was also consulted during that admission and neurology recommend outpatient follow-up.  Chest pain: Resolved.  Atypical chest pain, mild elevation of troponin, flat trend.  Leukocytosis: Stable.  Most likely reactive, no evidence of infection  Depression/anxiety: On Prozac at home  History of coronary artery disease: On aspirin.  Will hold for now due to drop in the hemoglobin  Hypertension: Blood pressure has been stable after restarted on home medicines.  Continue to monitor blood pressure.  On metoprolol, amlodipine, Imdur.  Was also on losartan/hydrochlorothiazide at home, currently on hold  GERD: Continue Protonix  Hypothyroidism: Continue Synthyroid  CKD stage IIIa: Currently kidney function at baseline  Constipation: Continue aggressive bowel regimen  Diabetes type 2: On insulin at home. Continue current insulin regimen.  Monitor blood sugars.  Debility/deconditioning: Patient seen by PT/OT recommended SNF on DC.  Patient is chronically wheelchair-bound.  Recently she had right femur fracture, status post ORIF.Daughter wants her to be placed in SNF.  TOC following.           DVT prophylaxis:SCD Code Status: Full Family Communication: Daughter on phone on 06/23/2021 Status is: Inpatient  Remains inpatient appropriate because:Inpatient level of  care appropriate due to severity of illness  Dispo: The patient is from: Home              Anticipated d/c is to: SNF              Patient currently is not medically stable to d/c.   Difficult to place patient No     Consultants:  None  Procedures:None  Antimicrobials:  Anti-infectives (From admission, onward)    None       Subjective:  Patient seen and examined the bedside this afternoon.  Hemodynamically stable.  Lying on the bed.  Alert and awake but confused.  Denies any new complaints.  Appears comfortable.   Objective: Vitals:   06/22/21 0136 06/22/21 0654 06/22/21 1133 06/22/21 2013  BP: (!) 146/67  128/60 (!) 105/48  Pulse: 93  81 91  Resp: '20  18 18  '$ Temp: 98.7 F (37.1 C) 99.7 F (37.6 C) 98 F (36.7 C) 99.6 F (37.6 C)  TempSrc: Axillary Oral  Oral  SpO2: 96%  100% 99%  Weight:      Height:        Intake/Output Summary (Last 24 hours) at 06/23/2021 1241 Last data filed at 06/23/2021 0900 Gross per 24 hour  Intake 240 ml  Output 0 ml  Net 240 ml   Filed Weights   06/22/2021 1800 06/21/21 1101 06/21/21 1403  Weight: 108.9 kg 108.9 kg 110.8 kg    Examination:  General exam: Overall comfortable, not in distress HEENT: PERRL Respiratory system:  no wheezes or crackles  Cardiovascular system: S1 & S2 heard, RRR.  Gastrointestinal system: Abdomen is nondistended, soft and nontender. Central nervous system: Alert and awake but not oriented Extremities: No edema, no clubbing ,no cyanosis Skin: No rashes, no ulcers,no icterus      Data Reviewed: I have personally reviewed following labs and imaging studies  CBC: Recent Labs  Lab 06/16/2021 0054 06/26/2021 1930 06/21/21 0458 06/23/21 0823  WBC 13.2* 12.9* 11.0* 10.8*  NEUTROABS 11.6* 10.8*  --   --   HGB 9.7* 9.0* 8.1* 6.4*  HCT 31.0* 29.1* 25.6* 20.1*  MCV 106.2* 107.8* 105.8* 105.8*  PLT 326 372 292 [id number]   Basic Metabolic Panel: Recent Labs  Lab 06/22/2021 0054 06/21/2021 1930 06/21/21 0458 06/23/21 0823  NA 135 137 137 138  K 4.2 3.8 3.5 3.5  CL 106 106 108 111  CO2 16* 17* 19* 20*  GLUCOSE 170* 240* 206* 128*  BUN '15 16 14 17  '$ CREATININE 0.91 1.21* 1.04* 1.16*  CALCIUM 9.5 9.8 9.4 8.9  MG  --   --  1.6* 1.9   PHOS  --   --  2.8  --    GFR: Estimated Creatinine Clearance: 55.1 mL/min (A) (by C-G formula based on SCr of 1.16 mg/dL (H)). Liver Function Tests: Recent Labs  Lab 06/29/2021 0054 06/21/21 0458  AST 25 20  ALT 17 15  ALKPHOS 54 54  BILITOT 1.1 0.8  PROT 7.3 6.4*  ALBUMIN 3.4* 3.1*   Recent Labs  Lab 06/27/2021 0054  LIPASE 22   No results for input(s): AMMONIA in the last 168 hours. Coagulation Profile: Recent Labs  Lab 06/18/2021 0054  INR 1.2   Cardiac Enzymes: Recent Labs  Lab 06/14/2021 0054  CKTOTAL 111   BNP (last 3 results) No results for input(s): PROBNP in the last 8760 hours. HbA1C: No results for input(s): HGBA1C in the last 72 hours. CBG: Recent Labs  Lab 06/21/21 2214  06/22/21 0735 06/22/21 1131 06/22/21 1603 06/23/21 1205  GLUCAP 95 149* 170* 140* 125*   Lipid Profile: No results for input(s): CHOL, HDL, LDLCALC, TRIG, CHOLHDL, LDLDIRECT in the last 72 hours. Thyroid Function Tests: No results for input(s): TSH, T4TOTAL, FREET4, T3FREE, THYROIDAB in the last 72 hours. Anemia Panel: No results for input(s): VITAMINB12, FOLATE, FERRITIN, TIBC, IRON, RETICCTPCT in the last 72 hours. Sepsis Labs: Recent Labs  Lab 06/14/2021 0304 06/08/2021 1930 06/14/2021 2216 06/21/21 0458  LATICACIDVEN 2.3* 2.7* 2.3* 1.9    Recent Results (from the past 240 hour(s))  Culture, blood (Routine x 2)     Status: None (Preliminary result)   Collection Time: 07/04/2021 12:54 AM   Specimen: BLOOD RIGHT HAND  Result Value Ref [place] Status   Specimen Description   Final    BLOOD RIGHT HAND Performed at [facility] [address] [place], [place] [address]    Special Requests   Final    BOTTLES DRAWN AEROBIC AND ANAEROBIC Blood Culture results may not be optimal due to an inadequate volume of blood received in culture bottles Performed at [facility] [address], [place], [place] [address]    Culture   Final    NO GROWTH  3 DAYS Performed at [facility], [place] [address] [place], [place] [address]    Report Status PENDING  Incomplete  Culture, blood (Routine x 2)     Status: None (Preliminary result)   Collection Time: 06/19/2021  9:00 PM   Specimen: BLOOD  Result Value Ref [place] Status   Specimen Description   Final    BLOOD BLOOD LEFT HAND Performed at [facility] [address] [place], [place] [address]    Special Requests   Final    BOTTLES DRAWN AEROBIC AND ANAEROBIC Blood Culture adequate volume Performed at [facility] [address], [place], [place] [address]    Culture   Final    NO GROWTH 3 DAYS Performed at [facility], [place] [address] [place], [place] [address]    Report Status PENDING  Incomplete  Culture, blood (routine x 2)     Status: None (Preliminary result)   Collection Time: 06/17/2021  9:00 PM   Specimen: BLOOD  Result Value Ref [place] Status   Specimen Description   Final    BLOOD BLOOD RIGHT HAND Performed at [facility] [address], [place], [place] [address]    Special Requests   Final    BOTTLES DRAWN AEROBIC AND ANAEROBIC Blood Culture adequate volume Performed at [facility] [address] [place],  [address]    Culture   Final    NO GROWTH 3 DAYS Performed at [facility], [place] [address], [place],  [address]    Report Status PENDING  Incomplete         Radiology Studies: No results found.      Scheduled Meds:  sodium chloride   Intravenous Once   amLODipine  10 mg Oral Daily   aspirin EC  81 mg Oral Daily   atorvastatin  40 mg Oral QHS   enoxaparin (LOVENOX) injection  55 mg Subcutaneous Q24H   FLUoxetine  20 mg Oral Daily   insulin aspart  0-20 Units Subcutaneous TID WC   insulin glargine-yfgn  10 Units Subcutaneous Daily   isosorbide mononitrate  60 mg Oral Daily   levothyroxine  50 mcg Oral Q0600  linaclotide  72 mcg Oral  QAC breakfast   megestrol  400 mg Oral Daily   metoprolol succinate  50 mg Oral Daily   Continuous Infusions:  sodium chloride 75 mL/hr at 06/22/21 2151     LOS: 2 days    Time spent: 35 mins.More than 50% of that time was spent in counseling and/or coordination of care.      Shelly Coss, MD [facility] P9/17/2022, 12:41 PM

## 2021-06-24 DIAGNOSIS — E872 Acidosis: Secondary | ICD-10-CM | POA: Diagnosis not present

## 2021-06-24 LAB — CBC WITH DIFFERENTIAL/PLATELET
Abs Immature Granulocytes: 0.16 10*3/uL — ABNORMAL HIGH (ref 0.00–0.07)
Basophils Absolute: 0.1 10*3/uL (ref 0.0–0.1)
Basophils Relative: 0 %
Eosinophils Absolute: 0.2 10*3/uL (ref 0.0–0.5)
Eosinophils Relative: 2 %
HCT: 23.9 % — ABNORMAL LOW (ref 36.0–46.0)
Hemoglobin: 8 g/dL — ABNORMAL LOW (ref 12.0–15.0)
Immature Granulocytes: 1 %
Lymphocytes Relative: 19 %
Lymphs Abs: 2.3 10*3/uL (ref 0.7–4.0)
MCH: 32.5 pg (ref 26.0–34.0)
MCHC: 33.5 g/dL (ref 30.0–36.0)
MCV: 97.2 fL (ref 80.0–100.0)
Monocytes Absolute: 0.7 10*3/uL (ref 0.1–1.0)
Monocytes Relative: 6 %
Neutro Abs: 9.1 10*3/uL — ABNORMAL HIGH (ref 1.7–7.7)
Neutrophils Relative %: 72 %
Platelets: 312 10*3/uL (ref 150–400)
RBC: 2.46 MIL/uL — ABNORMAL LOW (ref 3.87–5.11)
RDW: 18.2 % — ABNORMAL HIGH (ref 11.5–15.5)
WBC: 12.5 10*3/uL — ABNORMAL HIGH (ref 4.0–10.5)
nRBC: 0.7 % — ABNORMAL HIGH (ref 0.0–0.2)

## 2021-06-24 LAB — BASIC METABOLIC PANEL
Anion gap: 8 (ref 5–15)
BUN: 16 mg/dL (ref 8–23)
CO2: 19 mmol/L — ABNORMAL LOW (ref 22–32)
Calcium: 9.4 mg/dL (ref 8.9–10.3)
Chloride: 115 mmol/L — ABNORMAL HIGH (ref 98–111)
Creatinine, Ser: 1.07 mg/dL — ABNORMAL HIGH (ref 0.44–1.00)
GFR, Estimated: 56 mL/min — ABNORMAL LOW (ref >60)
Glucose, Bld: 201 mg/dL — ABNORMAL HIGH (ref 70–99)
Potassium: 3.6 mmol/L (ref 3.5–5.1)
Sodium: 142 mmol/L (ref 135–145)

## 2021-06-24 LAB — FERRITIN: Ferritin: 129 ng/mL (ref 11–307)

## 2021-06-24 LAB — IRON AND TIBC
Iron: 54 ug/dL (ref 28–170)
Saturation Ratios: 20 % (ref 10.4–31.8)
TIBC: 264 ug/dL (ref 250–450)
UIBC: 210 ug/dL

## 2021-06-24 NOTE — NC FL2 (Signed)
[place] MEDICAID FL2 LEVEL OF CARE SCREENING TOOL     IDENTIFICATION  Patient Name: [name] Birthdate: May 30, 1951 Sex: female Admission Date (Current Location): 06/19/2021  [facility] and [place] Number:  Herbalist and Address:  [facility],  [place], [place]      Provider Number: [id number]  Attending Physician Name and Address:  Shelly Coss, MD  Relative Name and Phone Number:  Laneta Simmers [phone]    Current Level of Care: Hospital Recommended Level of Care: Nursing Facility Prior Approval Number:    Date Approved/Denied:   PASRR Number: [id number]  Discharge Plan: Other (Comment) (LTC)    Current Diagnoses: Patient Active Problem List   Diagnosis Date Noted   Tachycardia 07/05/2021   Lactic acidosis 06/08/2021   Cognitive disorder 05/21/2021   Generalized weakness 05/17/2021   Femur fracture ([place]) 05/01/2021   Bilateral hand pain 05/01/2021   Weight loss 04/15/2021   Leukocytosis 04/15/2021   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   Acute diverticulitis 03/21/2021   Degenerative disc disease, cervical 02/08/2021   Type 2 diabetes mellitus ([place]) 02/02/2021   Acquired hypothyroidism 11/24/2020   Chronic pain 11/24/2020   Severe sepsis ([place]) 11/09/2020   Hyperlipidemia associated with type 2 diabetes mellitus ([place]) 99991111   Acute metabolic encephalopathy 99991111   Acute lower UTI 11/09/2020   Recurrent falls 10/19/2020   Gait disorder 10/11/2020   Fall 10/11/2020   Low grade fever 02/04/2020   Chest pain 01/26/2020   Lumbar post-laminectomy syndrome 10/05/2019   Spinal cord stimulator status 10/05/2019   Body mass index (BMI) 40.0-44.9, adult ([place]) 10/05/2019   Rectal bleeding 09/01/2019   History of peptic ulcer disease 09/01/2019   Screening for viral disease 09/01/2019   Insulin dependent type 2 diabetes mellitus ([place]) 09/01/2019   Pyelonephritis 06/05/2019   Recurrent UTI  06/05/2019   Urinary incontinence 06/05/2019   Nausea & vomiting 02/08/2019   Abdominal pain 02/08/2019   Constipation 02/08/2019   UTI (urinary tract infection) 02/08/2019   LUQ pain 01/21/2019   Cervical radiculopathy at C6 01/28/2018   Left lateral epicondylitis 01/23/2018   Obesity 01/23/2018   Weakness 05/01/2017   Hyperkalemia 05/01/2017   Acute kidney injury superimposed on CKD ([place]) 05/01/2017   Essential (primary) hypertension 05/01/2017   Sepsis ([place]) 05/01/2017   Dysuria 04/23/2017   Right leg swelling 12/27/2016   Leg pain, lateral, right 11/05/2016   Radiculopathy 04/03/2016   Hypersomnolence 11/23/2015   Right sided abdominal pain 07/25/2015   Malaise and fatigue 11/05/2014   Rotator cuff syndrome 07/26/2014   Fracture of fifth metacarpal bone of right hand 07/08/2014   Right cervical radiculopathy 06/27/2014   Chronic interstitial cystitis 05/23/2014   Osteoarthritis of CMC joint of thumb 04/11/2014   De Quervain's tenosynovitis, left 03/14/2014   CKD (chronic kidney disease), stage III ([place]) 03/03/2014   Lower back pain 03/03/2014   Ingrown nail 03/03/2014   Peripheral edema 02/23/2014   Lateral epicondylitis of right elbow 01/31/2014   Neck pain on left side 11/19/2013   Abdominal discomfort 10/12/2012   Right lumbar radiculopathy 05/11/2012   Peripheral neuropathy 05/11/2012   Vertigo 03/29/2012   Headache(784.0) 03/10/2012   Left lower quadrant abdominal pain 10/24/2011   Lumbar radiculopathy 10/24/2011   Hypothyroidism 08/19/2011   Right leg weakness 04/04/2011   Hematochezia 01/07/2011   Preventative health care 01/07/2011   CHOLELITHIASIS 05/04/2009   GERD 04/26/2009   Gastroparesis 04/26/2009   ULCER-GASTRIC 03/15/2009   DIVERTICULOSIS-COLON  03/15/2009   Cervical radiculopathy 10/28/2008   MENOPAUSAL DISORDER 06/29/2008   Pain in joint, shoulder region 10/12/2007   INSOMNIA-SLEEP DISORDER-UNSPEC 09/01/2007   Depression 09/01/2007    Macrocytic anemia 08/13/2007   COMMON MIGRAINE 08/13/2007   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   Diabetes ([place]) 05/06/2007   HLD (hyperlipidemia) 05/06/2007   ANXIETY 05/06/2007   Hypertension associated with diabetes ([place]) 05/06/2007   Coronary atherosclerosis 05/06/2007    Orientation RESPIRATION BLADDER Height & Weight     Self  Normal Incontinent Weight: 110.8 kg Height:  '5\' 5"'$  (165.1 cm)  BEHAVIORAL SYMPTOMS/MOOD NEUROLOGICAL BOWEL NUTRITION STATUS      Incontinent Diet (CHO MOD)  AMBULATORY STATUS COMMUNICATION OF NEEDS Skin   Total Care Verbally Normal                       Personal Care Assistance Level of Assistance  Bathing, Feeding, Dressing   Feeding assistance: Maximum assistance Dressing Assistance: Maximum assistance     Functional Limitations Info  Sight, Speech, Hearing Sight Info: Impaired (eyeglasses) Hearing Info: Adequate Speech Info: Impaired (Dentures-top)    SPECIAL CARE FACTORS FREQUENCY                       Contractures Contractures Info: Not present    Additional Factors Info  Code Status, Allergies Code Status Info:  (full) Allergies Info:  (Ciprofloxacin, Hydrocodone, Hydrocortisone, Strawberry (Diagnostic), Tomato, Penicillins)           Current Medications (06/24/2021):  This is the current hospital active medication list Current Facility-Administered Medications  Medication Dose Route Frequency Provider Last Rate Last Admin   acetaminophen (TYLENOL) tablet 650 mg  650 mg Oral Q6H PRN Reubin Milan, MD   650 mg at 06/22/21 2245   Or   acetaminophen (TYLENOL) suppository 650 mg  650 mg Rectal Q6H PRN Reubin Milan, MD       amLODipine (NORVASC) tablet 10 mg  10 mg Oral Daily Mariel Aloe, MD   10 mg at 06/24/21 1032   atorvastatin (LIPITOR) tablet 40 mg  40 mg Oral QHS Mariel Aloe, MD   40 mg at 06/22/21 2150   FLUoxetine (PROZAC) capsule 20 mg  20 mg Oral Daily Mariel Aloe, MD   20 mg at  06/24/21 1031   insulin aspart (novoLOG) injection 0-20 Units  0-20 Units Subcutaneous TID WC Reubin Milan, MD   4 Units at 06/24/21 1235   insulin glargine-yfgn (SEMGLEE) injection 10 Units  10 Units Subcutaneous Daily Mariel Aloe, MD   10 Units at 06/24/21 1050   isosorbide mononitrate (IMDUR) 24 hr tablet 60 mg  60 mg Oral Daily Mariel Aloe, MD   60 mg at 06/24/21 1032   levothyroxine (SYNTHROID) tablet 50 mcg  50 mcg Oral Q0600 Mariel Aloe, MD   50 mcg at 06/24/21 1032   linaclotide (LINZESS) capsule 72 mcg  72 mcg Oral QAC breakfast Mariel Aloe, MD   72 mcg at 06/24/21 1036   LORazepam (ATIVAN) injection 0.5 mg  0.5 mg Intravenous Once Lovey Newcomer T, NP       megestrol (MEGACE) 400 MG/10ML suspension 400 mg  400 mg Oral Daily Mariel Aloe, MD   400 mg at 06/24/21 1036   melatonin tablet 5 mg  5 mg Oral QHS PRN Blount, Scarlette Shorts T, NP       metoprolol succinate (TOPROL-XL) 24 hr tablet 50 mg  50 mg Oral Daily Mariel Aloe, MD   50 mg at 06/24/21 1031   ondansetron (ZOFRAN) tablet 4 mg  4 mg Oral Q6H PRN Reubin Milan, MD       Or   ondansetron [facility]) injection 4 mg  4 mg Intravenous Q6H PRN Reubin Milan, MD       polyethylene glycol (MIRALAX / Floria Raveling) packet 17 g  17 g Oral Daily Shelly Coss, MD   17 g at 06/24/21 1037     Discharge Medications: Please see discharge summary for a list of discharge medications.  Relevant Imaging Results:  Relevant Lab Results:   Additional Information SSN: [id number].  Pt is vaccinated for covid x 3  Willman Cuny, Juliann Pulse, RN

## 2021-06-24 NOTE — NC FL2 (Signed)
[place] MEDICAID FL2 LEVEL OF CARE SCREENING TOOL     IDENTIFICATION  Patient Name: [name] Birthdate: [birth year] Sex: female Admission Date (Current Location): 06/25/2021  [facility] and [place] Number:  Herbalist and Address:  [facility],  [place], [place]      Provider Number: [id number]  Attending Physician Name and Address:  Shelly Coss, MD  Relative Name and Phone Number:  Laneta Simmers [phone]    Current Level of Care: Hospital Recommended Level of Care: Nursing Facility Prior Approval Number:    Date Approved/Denied:   PASRR Number: [id number]  Discharge Plan: Other (Comment) (LTC)    Current Diagnoses: Patient Active Problem List   Diagnosis Date Noted   Tachycardia 06/19/2021   Lactic acidosis 07/04/2021   Cognitive disorder 05/21/2021   Generalized weakness 05/17/2021   Femur fracture ([place]) 05/01/2021   Bilateral hand pain 05/01/2021   Weight loss 04/15/2021   Leukocytosis 04/15/2021   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   Acute diverticulitis 03/21/2021   Degenerative disc disease, cervical 02/08/2021   Type 2 diabetes mellitus ([place]) 02/02/2021   Acquired hypothyroidism 11/24/2020   Chronic pain 11/24/2020   Severe sepsis ([place]) 11/09/2020   Hyperlipidemia associated with type 2 diabetes mellitus ([place]) 99991111   Acute metabolic encephalopathy 99991111   Acute lower UTI 11/09/2020   Recurrent falls 10/19/2020   Gait disorder 10/11/2020   Fall 10/11/2020   Low grade fever 02/04/2020   Chest pain 01/26/2020   Lumbar post-laminectomy syndrome 10/05/2019   Spinal cord stimulator status 10/05/2019   Body mass index (BMI) 40.0-44.9, adult ([place]) 10/05/2019   Rectal bleeding 09/01/2019   History of peptic ulcer disease 09/01/2019   Screening for viral disease 09/01/2019   Insulin dependent type 2 diabetes mellitus ([place]) 09/01/2019   Pyelonephritis 06/05/2019   Recurrent UTI  06/05/2019   Urinary incontinence 06/05/2019   Nausea & vomiting 02/08/2019   Abdominal pain 02/08/2019   Constipation 02/08/2019   UTI (urinary tract infection) 02/08/2019   LUQ pain 01/21/2019   Cervical radiculopathy at C6 01/28/2018   Left lateral epicondylitis 01/23/2018   Obesity 01/23/2018   Weakness 05/01/2017   Hyperkalemia 05/01/2017   Acute kidney injury superimposed on CKD ([place]) 05/01/2017   Essential (primary) hypertension 05/01/2017   Sepsis ([employer]) 05/01/2017   Dysuria 04/23/2017   Right leg swelling 12/27/2016   Leg pain, lateral, right 11/05/2016   Radiculopathy 04/03/2016   Hypersomnolence 11/23/2015   Right sided abdominal pain 07/25/2015   Malaise and fatigue 11/05/2014   Rotator cuff syndrome 07/26/2014   Fracture of fifth metacarpal bone of right hand 07/08/2014   Right cervical radiculopathy 06/27/2014   Chronic interstitial cystitis 05/23/2014   Osteoarthritis of CMC joint of thumb 04/11/2014   De Quervain's tenosynovitis, left 03/14/2014   CKD (chronic kidney disease), stage III ([place]) 03/03/2014   Lower back pain 03/03/2014   Ingrown nail 03/03/2014   Peripheral edema 02/23/2014   Lateral epicondylitis of right elbow 01/31/2014   Neck pain on left side 11/19/2013   Abdominal discomfort 10/12/2012   Right lumbar radiculopathy 05/11/2012   Peripheral neuropathy 05/11/2012   Vertigo 03/29/2012   Headache(784.0) 03/10/2012   Left lower quadrant abdominal pain 10/24/2011   Lumbar radiculopathy 10/24/2011   Hypothyroidism 08/19/2011   Right leg weakness 04/04/2011   Hematochezia 01/07/2011   Preventative health care 01/07/2011   CHOLELITHIASIS 05/04/2009   GERD 04/26/2009   Gastroparesis 04/26/2009   ULCER-GASTRIC 03/15/2009   DIVERTICULOSIS-COLON  03/15/2009   Cervical radiculopathy 10/28/2008   MENOPAUSAL DISORDER 06/29/2008   Pain in joint, shoulder region 10/12/2007   INSOMNIA-SLEEP DISORDER-UNSPEC 09/01/2007   Depression 09/01/2007    Macrocytic anemia 08/13/2007   COMMON MIGRAINE 08/13/2007   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   Diabetes ([place]) 05/06/2007   HLD (hyperlipidemia) 05/06/2007   ANXIETY 05/06/2007   Hypertension associated with diabetes ([place]) 05/06/2007   Coronary atherosclerosis 05/06/2007    Orientation RESPIRATION BLADDER Height & Weight     Self  Normal Incontinent Weight: 110.8 kg Height:  '5\' 5"'$  (165.1 cm)  BEHAVIORAL SYMPTOMS/MOOD NEUROLOGICAL BOWEL NUTRITION STATUS      Incontinent Diet (CHO MOD)  AMBULATORY STATUS COMMUNICATION OF NEEDS Skin   Total Care Verbally Normal                       Personal Care Assistance Level of Assistance  Bathing, Feeding, Dressing   Feeding assistance: Maximum assistance Dressing Assistance: Maximum assistance     Functional Limitations Info             SPECIAL CARE FACTORS FREQUENCY                       Contractures Contractures Info: Not present    Additional Factors Info  Code Status, Allergies Code Status Info:  (full) Allergies Info:  (Ciprofloxacin, Hydrocodone, Hydrocortisone, Strawberry (Diagnostic), Tomato, Penicillins)           Current Medications (06/24/2021):  This is the current hospital active medication list Current Facility-Administered Medications  Medication Dose Route Frequency Provider Last Rate Last Admin   acetaminophen (TYLENOL) tablet 650 mg  650 mg Oral Q6H PRN Reubin Milan, MD   650 mg at 06/22/21 2245   Or   acetaminophen (TYLENOL) suppository 650 mg  650 mg Rectal Q6H PRN Reubin Milan, MD       amLODipine (NORVASC) tablet 10 mg  10 mg Oral Daily Mariel Aloe, MD   10 mg at 06/24/21 1032   atorvastatin (LIPITOR) tablet 40 mg  40 mg Oral QHS Mariel Aloe, MD   40 mg at 06/22/21 2150   FLUoxetine (PROZAC) capsule 20 mg  20 mg Oral Daily Mariel Aloe, MD   20 mg at 06/24/21 1031   insulin aspart (novoLOG) injection 0-20 Units  0-20 Units Subcutaneous TID WC Reubin Milan,  MD   4 Units at 06/24/21 1235   insulin glargine-yfgn (SEMGLEE) injection 10 Units  10 Units Subcutaneous Daily Mariel Aloe, MD   10 Units at 06/24/21 1050   isosorbide mononitrate (IMDUR) 24 hr tablet 60 mg  60 mg Oral Daily Mariel Aloe, MD   60 mg at 06/24/21 1032   levothyroxine (SYNTHROID) tablet 50 mcg  50 mcg Oral Q0600 Mariel Aloe, MD   50 mcg at 06/24/21 1032   linaclotide (LINZESS) capsule 72 mcg  72 mcg Oral QAC breakfast Mariel Aloe, MD   72 mcg at 06/24/21 1036   LORazepam (ATIVAN) injection 0.5 mg  0.5 mg Intravenous Once Lovey Newcomer T, NP       megestrol (MEGACE) 400 MG/10ML suspension 400 mg  400 mg Oral Daily Mariel Aloe, MD   400 mg at 06/24/21 1036   melatonin tablet 5 mg  5 mg Oral QHS PRN Blount, Lolita Cram, NP       metoprolol succinate (TOPROL-XL) 24 hr tablet 50 mg  50 mg Oral Daily Nettey,  Evalee Jefferson, MD   50 mg at 06/24/21 1031   ondansetron (ZOFRAN) tablet 4 mg  4 mg Oral Q6H PRN Reubin Milan, MD       Or   ondansetron [facility]) injection 4 mg  4 mg Intravenous Q6H PRN Reubin Milan, MD       polyethylene glycol (MIRALAX / [place]ia Raveling) packet 17 g  17 g Oral Daily Shelly Coss, MD   17 g at 06/24/21 1037     Discharge Medications: Please see discharge summary for a list of discharge medications.  Relevant Imaging Results:  Relevant Lab Results:   Additional Information SSN: [id number].  Pt is vaccinated for covid x 3  Kie Calvin, Juliann Pulse, RN

## 2021-06-24 NOTE — TOC Progression Note (Signed)
Transition of Care [facility]) - Progression Note    Patient Details  Name: [name] MRN: [id number] Date of Birth: September 13, 1951  [facility] CM/SW Contact  Crucita Lacorte, Juliann Pulse, RN Phone Number: 06/24/2021, 12:51 PM  Clinical Narrative:  Faxed out to LTC(patient not appropriate for ST SNF) await LTC bed offers-left vm w/dtr [place] for fax# of [place] [employer] to fax fl2 if needed for pursuing medicaid-await call back. See all prior notes.    Expected Discharge Plan: Long Term Nursing Home Barriers to Discharge: Continued Medical Work up  Expected Discharge Plan and Services Expected Discharge Plan: [place]   Discharge Planning Services: CM Consult Post Acute Care Choice: [place] arrangements for the past 2 months: Single Family Home                           HH Arranged: RN, PT, OT, Nurse's Aide, Social Work [employer] Agency: [place] ([place]) Date Pump Back: 06/22/21 Time [place]: 54 Representative spoke with at [place]: [place] ([place]) Interventions    Readmission Risk Interventions Readmission Risk Prevention Plan 04/24/2021  Transportation Screening Complete  PCP or Specialist appointment within 3-5 days of discharge Complete  HRI or [place] Complete  SW Recovery Care/Counseling Consult Complete  [place] Not Applicable  Some recent data might be hidden

## 2021-06-24 NOTE — Plan of Care (Signed)
  Problem: Education: Goal: Knowledge of General Education information will improve Description: Including pain rating scale, medication(s)/side effects and non-pharmacologic comfort measures Outcome: Progressing   Problem: Clinical Measurements: Goal: Diagnostic test results will improve Outcome: Progressing Goal: Respiratory complications will improve Outcome: Progressing Goal: Cardiovascular complication will be avoided Outcome: Progressing

## 2021-06-24 NOTE — Progress Notes (Signed)
PROGRESS NOTE    [name]  [id number] DOB: 07-30-1951 DOA: 06/12/2021 PCP: Biagio Borg, MD   Chief Complain: Fast heartbeat.  Brief Narrative: Patient is a 70 year old female with history of anxiety, depression, osteoarthritis, chronic back pain, CKD stage IIIa, coronary disease, diabetes type 2, peptic ulcer disease with gastric ulcer, gastroparesis, hyperlipidemia, hypothyroidism, chronic anemia who presented to the emergency department after she was referred by her PCP for the evaluation of fast heartbeat.  On presentation she looked dehydrated and was started on IV fluids.  Hospital course remarkable for drop in the hemoglobin in the range of 6.  Transfused with a unit of PRBC on 06/23/21.  PT/OT recommending SN facility on discharge.  Assessment & Plan:   Principal Problem:   Lactic acidosis Active Problems:   HLD (hyperlipidemia)   Macrocytic anemia   Depression   Coronary atherosclerosis   GERD   Hypothyroidism   CKD (chronic kidney disease), stage III (HCC)   Essential (primary) hypertension   Insulin dependent type 2 diabetes mellitus (HCC)   Leukocytosis   Generalized weakness   Tachycardia   Acute on chronic macrocytic anemia: Her baseline hemoglobin is in the range of 8-10.  Hemoglobin suddenly dropped from 9 to 6.4 today.  No evidence of acute blood loss.  She has chronic macrocytosis.  We will check FOBT,.will check iron level. Patient is a very poor historian but daughter said she  hasnt  passed melena or bloody stools.  Denies any abdominal pain.  Vitamin [id number] and folic acid level checked on 05/18/2021 are normal.  She was transfused with a unit of PRBC on 06/23/21, hemoglobin stable in the range of 8 now.  Check CBC tomorrow Acute drop in the hemoglobin could also be from hemodilution from IV fluids.  Tachycardia: Main reason for presenting to the emergency department.  Currently she is in normal sinus rhythm.  Not tachycardic.  Suspected to be dehydrated on  presentation.  Lactic acidosis: Resolved with IV fluids  Confusion: Patient has cognitive impairment at baseline now for a while.  No documented history of dementia.  Urinalysis did not show any signs of UTI.  MRI of the brain has been ordered.  MRI was not done on last admission because she has a neurostimulator in her back.  Not sure if MRI can be done this time too Extensive work-up was done about her encephalopathy when she was admitted on July 2022 at [facility].  Neurology was also consulted during that admission and neurology recommend outpatient follow-up.  Chest pain: Resolved.  Atypical chest pain, mild elevation of troponin, flat trend.  Leukocytosis: Stable.  Most likely reactive, no evidence of infection  Depression/anxiety: On Prozac at home  History of coronary artery disease: On aspirin.  Will hold for now due to drop in the hemoglobin  Hypertension: Blood pressure has been stable after restarted on home medicines.  Continue to monitor blood pressure.  On metoprolol, amlodipine, Imdur.  Was also on losartan/hydrochlorothiazide at home, currently on hold,we will resume if she remains hypertensive  GERD: Continue Protonix  Hypothyroidism: Continue Synthyroid  CKD stage IIIa: Currently kidney function at baseline  Constipation: Continue aggressive bowel regimen  Diabetes type 2: On insulin at home. Continue current insulin regimen.  Monitor blood sugars.  Debility/deconditioning: Patient seen by PT/OT recommended SNF on DC.  Patient is chronically wheelchair-bound.  Recently she had right femur fracture, status post ORIF.Daughter wants her to be placed in SNF.  TOC following.  Morbid obesity:  BMI of 40.6           DVT prophylaxis:SCD Code Status: Full Family Communication: Daughter on phone on 06/23/2021 Status is: Inpatient  Remains inpatient appropriate because:Inpatient level of care appropriate due to severity of illness  Dispo: The patient is from:  Home              Anticipated d/c is to: SNF              Patient currently is not medically stable to d/c.   Difficult to place patient No     Consultants: None  Procedures:None  Antimicrobials:  Anti-infectives (From admission, onward)    None       Subjective:  Patient seen and examined the bedside this morning.  Hemodynamically stable.  Not in any kind of distress.  Confused.  Not agitated.  Denies any complaints   Objective: Vitals:   06/23/21 1630 06/23/21 1702 06/23/21 1944 06/24/21 0600  BP: 126/63 (!) 144/69 (!) 142/70 (!) 151/75  Pulse: 86 86 86 100  Resp: '18 18 18 20  '$ Temp: 99 F (37.2 C) 99 F (37.2 C) 98.7 F (37.1 C) 99.5 F (37.5 C)  TempSrc: Oral Oral Oral Axillary  SpO2: 100% 100% 100% 100%  Weight:      Height:        Intake/Output Summary (Last 24 hours) at 06/24/2021 0827 Last data filed at 06/24/2021 0600 Gross per 24 hour  Intake 716 ml  Output 1000 ml  Net -284 ml   Filed Weights   07/05/2021 1800 06/21/21 1101 06/21/21 1403  Weight: 108.9 kg 108.9 kg 110.8 kg    Examination:  General exam: Overall comfortable, not in distress, pleasantly confused female, obese HEENT: PERRL Respiratory system:  no wheezes or crackles  Cardiovascular system: S1 & S2 heard, RRR.  Gastrointestinal system: Abdomen is nondistended, soft and nontender. Central nervous system: Alert and awake but not oriented, obeys commands Extremities: No edema, no clubbing ,no cyanosis Skin: No rashes, no ulcers,no icterus      Data Reviewed: I have personally reviewed following labs and imaging studies  CBC: Recent Labs  Lab 07/03/2021 0054 06/28/2021 1930 06/21/21 0458 06/23/21 0823  WBC 13.2* 12.9* 11.0* 10.8*  NEUTROABS 11.6* 10.8*  --   --   HGB 9.7* 9.0* 8.1* 6.4*  HCT 31.0* 29.1* 25.6* 20.1*  MCV 106.2* 107.8* 105.8* 105.8*  PLT 326 372 292 [id number]   Basic Metabolic Panel: Recent Labs  Lab 06/21/2021 0054 07/01/2021 1930 06/21/21 0458 06/23/21 0823   NA 135 137 137 138  K 4.2 3.8 3.5 3.5  CL 106 106 108 111  CO2 16* 17* 19* 20*  GLUCOSE 170* 240* 206* 128*  BUN '15 16 14 17  '$ CREATININE 0.91 1.21* 1.04* 1.16*  CALCIUM 9.5 9.8 9.4 8.9  MG  --   --  1.6* 1.9  PHOS  --   --  2.8  --    GFR: Estimated Creatinine Clearance: 55.1 mL/min (A) (by C-G formula based on SCr of 1.16 mg/dL (H)). Liver Function Tests: Recent Labs  Lab 07/06/2021 0054 06/21/21 0458  AST 25 20  ALT 17 15  ALKPHOS 54 54  BILITOT 1.1 0.8  PROT 7.3 6.4*  ALBUMIN 3.4* 3.1*   Recent Labs  Lab 06/14/2021 0054  LIPASE 22   No results for input(s): AMMONIA in the last 168 hours. Coagulation Profile: Recent Labs  Lab 07/01/2021 0054  INR 1.2   Cardiac Enzymes: Recent Labs  Lab  06/07/2021 0054  CKTOTAL 111   BNP (last 3 results) No results for input(s): PROBNP in the last 8760 hours. HbA1C: No results for input(s): HGBA1C in the last 72 hours. CBG: Recent Labs  Lab 06/22/21 2022 06/23/21 0742 06/23/21 1205 06/23/21 1648 06/23/21 2050  GLUCAP 139* 132* 125* 136* 195*   Lipid Profile: No results for input(s): CHOL, HDL, LDLCALC, TRIG, CHOLHDL, LDLDIRECT in the last 72 hours. Thyroid Function Tests: No results for input(s): TSH, T4TOTAL, FREET4, T3FREE, THYROIDAB in the last 72 hours. Anemia Panel: No results for input(s): VITAMINB12, FOLATE, FERRITIN, TIBC, IRON, RETICCTPCT in the last 72 hours. Sepsis Labs: Recent Labs  Lab 06/13/2021 0304 07/06/2021 1930 07/04/2021 2216 06/21/21 0458  LATICACIDVEN 2.3* 2.7* 2.3* 1.9    Recent Results (from the past 240 hour(s))  Culture, blood (Routine x 2)     Status: None (Preliminary result)   Collection Time: 06/24/2021 12:54 AM   Specimen: BLOOD RIGHT HAND  Result Value Ref Range Status   Specimen Description   Final    BLOOD RIGHT HAND Performed at [facility] [address] [place], [place] [address]    Special Requests   Final    BOTTLES DRAWN AEROBIC AND ANAEROBIC Blood  Culture results may not be optimal due to an inadequate volume of blood received in culture bottles Performed at [facility] [address] [place], [place] [address]    Culture   Final    NO GROWTH 4 DAYS Performed at [facility], [place] [address] [place], [place] [address]    Report Status PENDING  Incomplete  Culture, blood (Routine x 2)     Status: None (Preliminary result)   Collection Time: 06/30/2021  9:00 PM   Specimen: BLOOD  Result Value Ref Range Status   Specimen Description   Final    BLOOD BLOOD LEFT HAND Performed at [facility] [address] [place], [place] [address]    Special Requests   Final    BOTTLES DRAWN AEROBIC AND ANAEROBIC Blood Culture adequate volume Performed at [facility] [address], [place], [place] [address]    Culture   Final    NO GROWTH 4 DAYS Performed at [facility], [place] [address], [place], [place] [address]    Report Status PENDING  Incomplete  Culture, blood (routine x 2)     Status: None (Preliminary result)   Collection Time: 06/09/2021  9:00 PM   Specimen: BLOOD  Result Value Ref Range Status   Specimen Description   Final    BLOOD BLOOD RIGHT HAND Performed at [facility] [address] [place], [place] [address]    Special Requests   Final    BOTTLES DRAWN AEROBIC AND ANAEROBIC Blood Culture adequate volume Performed at [facility] [address], [place], [place] [address]    Culture   Final    NO GROWTH 4 DAYS Performed at [facility], [place] [address], [place],  [address]    Report Status PENDING  Incomplete         Radiology Studies: No results found.      Scheduled Meds:  amLODipine  10 mg Oral Daily   atorvastatin  40 mg Oral QHS   FLUoxetine  20 mg Oral Daily   insulin aspart  0-20 Units Subcutaneous TID WC   insulin glargine-yfgn  10 Units Subcutaneous Daily   isosorbide  mononitrate  60 mg Oral Daily   levothyroxine  50 mcg Oral Q0600   linaclotide  72 mcg Oral QAC breakfast   LORazepam  0.5 mg Intravenous Once   megestrol  400 mg Oral Daily   metoprolol succinate  50 mg Oral Daily   polyethylene glycol  17 g Oral Daily   Continuous Infusions:     LOS: 3 days    Time spent: 35 mins.More than 50% of that time was spent in counseling and/or coordination of care.      Shelly Coss, MD [facility] P9/18/2022, 8:27 AM

## 2021-06-24 NOTE — Progress Notes (Signed)
Spoke with and updated the pt's daughter Crystal.  Jerene Pitch

## 2021-06-25 DIAGNOSIS — R195 Other fecal abnormalities: Secondary | ICD-10-CM

## 2021-06-25 DIAGNOSIS — D649 Anemia, unspecified: Secondary | ICD-10-CM

## 2021-06-25 DIAGNOSIS — E872 Acidosis: Secondary | ICD-10-CM | POA: Diagnosis not present

## 2021-06-25 LAB — BPAM RBC
Blood Product Expiration Date: 202210042359
ISSUE DATE / TIME: 202209171636
Unit Type and Rh: 1700

## 2021-06-25 LAB — CBC WITH DIFFERENTIAL/PLATELET
Abs Immature Granulocytes: 0.15 10*3/uL — ABNORMAL HIGH (ref 0.00–0.07)
Basophils Absolute: 0 10*3/uL (ref 0.0–0.1)
Basophils Relative: 0 %
Eosinophils Absolute: 0.3 10*3/uL (ref 0.0–0.5)
Eosinophils Relative: 3 %
HCT: 21.6 % — ABNORMAL LOW (ref 36.0–46.0)
Hemoglobin: 7.2 g/dL — ABNORMAL LOW (ref 12.0–15.0)
Immature Granulocytes: 2 %
Lymphocytes Relative: 32 %
Lymphs Abs: 3.2 10*3/uL (ref 0.7–4.0)
MCH: 32.6 pg (ref 26.0–34.0)
MCHC: 33.3 g/dL (ref 30.0–36.0)
MCV: 97.7 fL (ref 80.0–100.0)
Monocytes Absolute: 0.6 10*3/uL (ref 0.1–1.0)
Monocytes Relative: 6 %
Neutro Abs: 5.9 10*3/uL (ref 1.7–7.7)
Neutrophils Relative %: 57 %
Platelets: 281 10*3/uL (ref 150–400)
RBC: 2.21 MIL/uL — ABNORMAL LOW (ref 3.87–5.11)
RDW: 18.2 % — ABNORMAL HIGH (ref 11.5–15.5)
WBC: 10.2 10*3/uL (ref 4.0–10.5)
nRBC: 0.8 % — ABNORMAL HIGH (ref 0.0–0.2)

## 2021-06-25 LAB — TYPE AND SCREEN
ABO/RH(D): B NEG
Antibody Screen: NEGATIVE
Unit division: 0

## 2021-06-25 LAB — GLUCOSE, CAPILLARY
Glucose-Capillary: 121 mg/dL — ABNORMAL HIGH (ref 70–99)
Glucose-Capillary: 176 mg/dL — ABNORMAL HIGH (ref 70–99)
Glucose-Capillary: 158 mg/dL — ABNORMAL HIGH (ref 70–99)

## 2021-06-25 LAB — CULTURE, BLOOD (ROUTINE X 2)
Culture: NO GROWTH
Culture: NO GROWTH
Culture: NO GROWTH
Special Requests: ADEQUATE
Special Requests: ADEQUATE

## 2021-06-25 LAB — OCCULT BLOOD X 1 CARD TO LAB, STOOL: Fecal Occult Bld: POSITIVE — AB

## 2021-06-25 MED ORDER — PANTOPRAZOLE SODIUM 40 MG IV SOLR
40.0000 mg | Freq: Two times a day (BID) | INTRAVENOUS | Status: DC
  Administered 2021-06-25 – 2021-06-28 (×7): 40 mg via INTRAVENOUS
  Filled 2021-06-25 (×7): qty 40

## 2021-06-25 NOTE — TOC Progression Note (Signed)
Transition of Care [facility]) - Progression Note    Patient Details  Name: [name] MRN: [id number] Date of Birth: 06/17/1951  Transition of [facility] CM/SW Contact  Lubna Stegeman, Marjie Skiff, RN Phone Number: 06/25/2021, 2:23 PM  Clinical Narrative:    Spoke with pt daughter [name] to provide bed offers for LTC. Medicare.gov website given to [employer] for Medicare ratings. [name] to do some research on facilities and TOC will follow up with her.   Expected Discharge Plan: Long Term Nursing Home Barriers to Discharge: Continued Medical Work up  Expected Discharge Plan and Services Expected Discharge Plan: [place]   Discharge Planning Services: CM Consult Post Acute Care Choice: [place] arrangements for the past 2 months: Single Family Home                    HH Arranged: RN, PT, OT, Nurse's Aide, Social Work [employer] Agency: [place] ([place]) Date [place]: 06/22/21 Time [place]: 44 Representative spoke with at [place]: [place] ([place]) Interventions    Readmission Risk Interventions Readmission Risk Prevention Plan 04/24/2021  Transportation Screening Complete  PCP or Specialist appointment within 3-5 days of discharge Complete  HRI or [place] Complete  SW Recovery Care/Counseling Consult Complete  [place] Not Applicable  Some recent data might be hidden

## 2021-06-25 NOTE — Progress Notes (Signed)
Last void amount documented was around 2130 on 9/18.  PT pat is dry, bladder scan completed 383 found

## 2021-06-25 NOTE — Progress Notes (Signed)
0544 MRI called, Pt has nerve stimulator in her back.  MRI cannot be completed unitl the remote is found.  Per MRI tech As of Thursday 9/15 remote still has not been located

## 2021-06-25 NOTE — Consult Note (Signed)
Consultation  Referring Provider:  Dr. Tawanna Solo    Primary Care Physician:  Biagio Borg, MD Primary Gastroenterologist: Dr. Fuller Plan        Reason for Consultation: Anemia, Hemoccult + stool             HPI:   [name] is a 70 y.o. female with past medical history significant for anxiety, depression, osteoarthritis, stage 3 CKD, migraine headaches, CAD, type 2 diabetes, tachycardia (04/15/2021.  Echo with LVEF 65-70%) and palpitations as well as PAD with gastric ulcer, gastroparesis, GERD, hiatal hernia and multiple others listed below including longstanding iron deficiency anemia.  Patient initially presented to the hospital on 06/21/2019 with a complaint of tachycardia.  We are asked to consult in regards to anemia with Hemoccult positive stool.    Today, the patient is found laying comfortably in her bed with uneaten lunch by her side.  She describes that she was having some "problems with my heart" when she came, but is feeling better now.  She denies abdominal pain or GI complaints at all.  Does describe that occasionally she will see dark stools but "these are not every day".  Tells me they are not black or sticky.  Denies any heartburn or reflux.    Denies fever, chills, weight loss, change in bowel habits, shortness of breath, palpitations, abdominal pain or symptoms that awaken her from sleep.     ED course: Pulse 143 BP 150/87, CBC with white count of 12.9, hemoglobin 9.0, CT of abdomen pelvis with contrast showed foci of gas within the urinary bladder Lialda, otherwise unremarkable, left lumbar hernia now containing a short loop of small bowel, small fat-containing left inguinal hernia.  Question tiny supraumbilical ventral wall fat-containing hernia, 8 cm stool ball within the proximal to mid rectum with no stercoral colitis, stool throughout the majority of the colon, scattered diverticulosis without acute diverticulitis and aortic atherosclerosis  GI history: 09/01/2019 office  visit with Alonza Bogus, patient established with Dr. Fuller Plan: Seen for chronic constipation for which she uses Linzess, also history of reflux and gastroparesis on pantoprazole 40 mg daily, history of ulcer disease that required partial antrectomy, at that time complaining of left upper quadrant abdominal bloating and black stools.Hemoglobin ranging from 8.5-11; it was noted she declined repeat colonoscopies on multiple occasions; at that office visit it stated patient was convinced to proceed with EGD and colonoscopy and her Pantoprazole was increased to 40 mg twice daily 12/31/2012 EGD done in [place] at the [facility] for dyspepsia, nausea vomiting-small hiatal hernia at the GE junction, food residue in the gastric body, partial antrectomy in the gastric antrum, retroflexion revealed a hiatal hernia 2008 reported colonoscopy-we do not have the report  Past Medical History:  Diagnosis Date   Anxiety    Arthritis    Blood dyscrasia    "FREE BLEEDER"   CERVICAL RADICULOPATHY, LEFT    Chronic back pain    CKD (chronic kidney disease)    DR. SANF[facility]   COMMON MIGRAINE    CORONARY ARTERY DISEASE    Cough    CURRENT COLD   Cystitis    DEPRESSION    Diabetes mellitus, type II (HCC)    DIVERTICULOSIS-COLON    Dysrhythmia    palpitations   Gastric ulcer 04/2008   Gastroparesis    GERD (gastroesophageal reflux disease)    Hiatal hernia    Hyperlipidemia    Hypertension    Hypothyroidism  INSOMNIA-SLEEP DISORDER-UNSPEC    Iron deficiency anemia    Wears glasses     Past Surgical History:  Procedure Laterality Date   ABDOMINAL HYSTERECTOMY     ANTERIOR LAT LUMBAR FUSION Left 08/13/2017   Procedure: LEFT SIDED LUMBAR 3-4 LATERAL INTERBODY FUSION WITH INSTRUMENTATION AND ALLOGRAFT;  Surgeon: Phylliss Bob, MD;  Location: [facility];  Service: Orthopedics;  Laterality: Left;  LEFT SIDED LUMBAR 3-4 LATERAL INTERBODY FUSION WITH INSTRUMENTATION AND ALLOGRAFT;  REQUEST 3 HOURS   Back Stimulator  07/2018   BACK SURGERY  03/2016   BREAST EXCISIONAL BIOPSY Right    40 years ago   CHOLECYSTECTOMY  06/2009   COLONOSCOPY     ESOPHAGOGASTRODUODENOSCOPY     EYE SURGERY Bilateral    lasik   Gastric Wedge resection lipoma  11/2007   x2 with laparotomy and gastrostomy   JOINT REPLACEMENT     Left knee replacement     ORIF FEMUR FRACTURE Right 04/16/2021   Procedure: OPEN REDUCTION INTERNAL FIXATION (ORIF) DISTAL FEMUR FRACTURE;  Surgeon: Shona Needles, MD;  Location: [facility];  Service: Orthopedics;  Laterality: Right;   Rigth knee replacement with revision  04/2008   Dr. Berenice Primas   ROTATOR CUFF REPAIR Left 01/2009   s/p bladder surgury  09/2009   Dr. Terance Hart   SPINAL CORD STIMULATOR INSERTION N/A 09/11/2018   Procedure: LUMBAR SPINAL CORD STIMULATOR INSERTION;  Surgeon: Clydell Hakim, MD;  Location: [facility];  Service: Neurosurgery;  Laterality: N/A;  LUMBAR SPINAL CORD STIMULATOR INSERTION    Family History  Problem Relation Age of Onset   Diabetes Sister        x 3   Heart disease Sister        x2   Breast cancer Sister 68   Diabetes Brother        x3   Heart disease Brother        x2   Coronary artery disease Other        female 1st degree   Hypertension Other    Breast cancer Sister 3   Breast cancer Other 25   Diabetes Brother    Colon cancer Neg Hx     Social History   Tobacco Use   Smoking status: Former   Smokeless tobacco: Never   Tobacco comments:    quit 30 years ago  Vaping Use   Vaping Use: Never used  Substance Use Topics   Alcohol use: No    Alcohol/week: 0.0 standard drinks   Drug use: No    Prior to Admission medications   Medication Sig Start Date End Date Taking? Authorizing Provider  amLODipine (NORVASC) 10 MG tablet Take 1 tablet (10 mg total) by mouth daily. 04/25/21  Yes Shelly Coss, MD  aspirin 81 MG EC tablet Take 1 tablet (81 mg total) by mouth 2 (two) times daily. After one month,continue taking once a  day Patient taking differently: Take 81 mg by mouth daily. 04/24/21  Yes Shelly Coss, MD  atorvastatin (LIPITOR) 40 MG tablet TAKE ONE TABLET BY MOUTH EVERY NIGHT AT BEDTIME Patient taking differently: Take 40 mg by mouth at bedtime. 06/19/2021  Yes Biagio Borg, MD  docusate sodium (COLACE) 100 MG capsule Take 1 capsule (100 mg total) by mouth 2 (two) times daily as needed for mild constipation. 05/21/21  Yes Dessa Phi, DO  Dulaglutide (TRULICITY) 1.5 0000000 SOPN Inject 1.5 mg into the skin every Monday. 02/02/21  Yes [provider]  ELMIRON 100 MG  capsule Take 200 mg by mouth 2 (two) times daily.  07/20/13  Yes [provider]  fexofenadine (ALLEGRA) 180 MG tablet Take 1 tablet (180 mg total) by mouth daily. 05/01/21 05/01/22 Yes Biagio Borg, MD  FLUoxetine (PROZAC) 20 MG capsule TAKE ONE CAPSULE BY MOUTH DAILY Patient taking differently: Take 20 mg by mouth daily. 11/13/20  Yes Biagio Borg, MD  Insulin Glargine [facility]) 100 UNIT/ML Inject 30 Units into the skin daily. 02/02/21  Yes [provider]  insulin lispro (HUMALOG) 100 UNIT/ML injection Inject 3-20 Units into the skin See admin instructions. Per sliding scale 3 times daily   Yes [provider]  isosorbide mononitrate (IMDUR) 60 MG 24 hr tablet Take 1 tablet (60 mg total) by mouth daily. 02/21/20  Yes Biagio Borg, MD  levothyroxine (SYNTHROID) 50 MCG tablet Take 1 tablet (50 mcg total) by mouth daily at 6 (six) AM. 04/25/21  Yes Shelly Coss, MD  linaclotide [facility]) 72 MCG capsule Take 1 capsule (72 mcg total) by mouth daily before breakfast. 11/13/20  Yes Biagio Borg, MD  losartan-hydrochlorothiazide (HYZAAR) 100-25 MG tablet Take 1 tablet by mouth daily. 05/14/21  Yes [provider]  megestrol (MEGACE) 400 MG/10ML suspension Take 10 mLs (400 mg total) by mouth daily. 05/22/21  Yes Dessa Phi, DO  metoprolol succinate (TOPROL-XL) 50 MG 24 hr tablet TAKE 1 TABLET BY MOUTH  DAILY WITH OR IMMEDIATELY FOLLOWING A MEAL Patient taking differently: Take 50 mg by mouth daily. 03/29/21  Yes Biagio Borg, MD  nitroGLYCERIN (NITROSTAT) 0.4 MG SL tablet Place 1 tablet (0.4 mg total) under the tongue every 5 (five) minutes as needed for chest pain. 01/27/20  Yes Ghimire, Henreitta Leber, MD  ondansetron (ZOFRAN ODT) 8 MG disintegrating tablet '8mg'$  ODT q4 hours prn nausea Patient taking differently: Take 8 mg by mouth every 4 (four) hours as needed for nausea. 06/28/2021  Yes Delo, Nathaneil Canary, MD  pantoprazole (PROTONIX) 40 MG tablet TAKE 1 TABLET(40 MG) BY MOUTH TWICE DAILY Patient taking differently: Take 40 mg by mouth 2 (two) times daily. TAKE 1 TABLET(40 MG) BY MOUTH TWICE DAILY 11/13/20  Yes Biagio Borg, MD  polyethylene glycol (MIRALAX / GLYCOLAX) 17 g packet Take 17 g by mouth daily as needed. Patient taking differently: Take 17 g by mouth daily as needed for mild constipation. 01/27/20  Yes Ghimire, Henreitta Leber, MD  traMADol (ULTRAM) 50 MG tablet Take 1 tablet (50 mg total) by mouth every 6 (six) hours as needed. Patient taking differently: Take 50 mg by mouth every 6 (six) hours as needed for moderate pain. 06/12/21  Yes Isla Pence, MD  Vitamin D, Ergocalciferol, (DRISDOL) 1.25 MG (50000 UNIT) CAPS capsule Take 50,000 Units by mouth every 7 (seven) days.   Yes [provider]  cloNIDine (CATAPRES) 0.2 MG tablet Take 1 tablet (0.2 mg total) by mouth 2 (two) times daily. Patient not taking: Reported on 06/21/2021 04/24/21   Shelly Coss, MD  Continuous Blood Gluc Receiver (FREESTYLE LIBRE 2 READER) DEVI Use as directed twice daily E11.9 10/11/20   Biagio Borg, MD  Continuous Blood Gluc Sensor (FREESTYLE LIBRE 2 SENSOR) MISC Use as directed once bi-weekly E11.9 10/11/20   Biagio Borg, MD  glucose blood (FREESTYLE TEST STRIPS) test strip Use as instructed to check blood sugar 4 times daily. 04/20/18   Elayne Snare, MD  glucose blood test strip Use as instructed 12/21/20   Elayne Snare, MD  enoxaparin ([facility])  40 MG/0.4ML injection Inject 0.4 mLs (40 mg total) into the skin daily. 04/18/21 04/24/21  Delray Alt, PA-C    Current Facility-Administered Medications  Medication Dose Route Frequency Provider Last Rate Last Admin   acetaminophen (TYLENOL) tablet 650 mg  650 mg Oral Q6H PRN Reubin Milan, MD   650 mg at 06/24/21 2114   Or   acetaminophen (TYLENOL) suppository 650 mg  650 mg Rectal Q6H PRN Reubin Milan, MD       amLODipine (NORVASC) tablet 10 mg  10 mg Oral Daily Mariel Aloe, MD   10 mg at 06/25/21 1004   atorvastatin (LIPITOR) tablet 40 mg  40 mg Oral QHS Mariel Aloe, MD   40 mg at 06/24/21 2114   FLUoxetine (PROZAC) capsule 20 mg  20 mg Oral Daily Mariel Aloe, MD   20 mg at 06/25/21 1004   insulin aspart (novoLOG) injection 0-20 Units  0-20 Units Subcutaneous TID WC Reubin Milan, MD   3 Units at 06/25/21 1004   insulin glargine-yfgn (SEMGLEE) injection 10 Units  10 Units Subcutaneous Daily Mariel Aloe, MD   10 Units at 06/25/21 1006   isosorbide mononitrate (IMDUR) 24 hr tablet 60 mg  60 mg Oral Daily Mariel Aloe, MD   60 mg at 06/25/21 1004   levothyroxine (SYNTHROID) tablet 50 mcg  50 mcg Oral Q0600 Mariel Aloe, MD   50 mcg at 06/25/21 [id number]   linaclotide (LINZESS) capsule 72 mcg  72 mcg Oral QAC breakfast Mariel Aloe, MD   72 mcg at 06/24/21 1036   LORazepam (ATIVAN) injection 0.5 mg  0.5 mg Intravenous Once Lovey Newcomer T, NP       megestrol (MEGACE) 400 MG/10ML suspension 400 mg  400 mg Oral Daily Mariel Aloe, MD   400 mg at 06/25/21 1003   melatonin tablet 5 mg  5 mg Oral QHS PRN Lovey Newcomer T, NP   5 mg at 06/24/21 2114   metoprolol succinate (TOPROL-XL) 24 hr tablet 50 mg  50 mg Oral Daily Mariel Aloe, MD   50 mg at 06/25/21 1003   ondansetron (ZOFRAN) tablet 4 mg  4 mg Oral Q6H PRN Reubin Milan, MD       Or   ondansetron [facility]) injection 4 mg  4 mg Intravenous Q6H PRN Reubin Milan, MD       pantoprazole (PROTONIX) injection 40 mg  40 mg Intravenous Q12H Shelly Coss, MD       polyethylene glycol (MIRALAX / GLYCOLAX) packet 17 g  17 g Oral Daily Shelly Coss, MD   17 g at 06/25/21 1002    Allergies as of 06/30/2021 - Review Complete 06/15/2021  Allergen Reaction Noted   Ciprofloxacin Shortness Of Breath and Other (See Comments) 05/19/2014   Hydrocodone Shortness Of Breath and Other (See Comments) 03/15/2009   Hydrocortisone  11/24/2020   Tomato Other (See Comments) 05/18/2021   Penicillins Hives 05/06/2007     Review of Systems:    Constitutional: No weight loss, fever or chills Skin: No rash  Cardiovascular: No chest pain Respiratory: No SOB  Gastrointestinal: See HPI and otherwise negative Genitourinary: No dysuria  Neurological: No headache, dizziness or syncope Musculoskeletal: No new muscle or joint pain Hematologic: No bleeding  Psychiatric: No history of depression or anxiety    Physical Exam:  Vital signs in last 24 hours: Temp:  [98.3 F (36.8 C)-98.7 F (37.1 C)] 98.3 F (36.8  C) (09/18 2120) Pulse Rate:  [86-105] 86 (09/19 1003) Resp:  [18] 18 (09/18 2120) BP: (140-155)/(75-88) 149/88 (09/19 1003) SpO2:  [99 %-100 %] 99 % (09/18 2120) Last BM Date: 06/24/21 General:   Pleasant AA female appears to be in NAD, Well developed, Well nourished, alert and cooperative Head:  Normocephalic and atraumatic. Eyes:   PEERL, EOMI. No icterus. Conjunctiva pink. Ears:  Normal auditory acuity. Neck:  Supple Throat: Oral cavity and pharynx without inflammation, swelling or lesion.  Lungs: Respirations even and unlabored. Lungs clear to auscultation bilaterally.   No wheezes, crackles, or rhonchi.  Heart: Normal S1, S2. No MRG. Regular rate and rhythm. No peripheral edema, cyanosis or pallor.  Abdomen:  Soft, nondistended, nontender. No rebound or guarding. Normal bowel sounds. No appreciable masses or hepatomegaly. Rectal:  Not performed.   Msk:  Symmetrical without gross deformities. Peripheral pulses intact.  Extremities:  Without edema, no deformity or joint abnormality.  Neurologic:  Alert and  oriented x4;  grossly normal neurologically.  Skin:   Dry and intact without significant lesions or rashes. Psychiatric: Demonstrates good judgement and reason without abnormal affect or behaviors.   LAB RESULTS: Recent Labs    06/23/21 0823 06/24/21 1004 06/25/21 0416  WBC 10.8* 12.5* 10.2  HGB 6.4* 8.0* 7.2*  HCT 20.1* 23.9* 21.6*  PLT 290 312 281   BMET Recent Labs    06/23/21 0823 06/24/21 1004  NA 138 142  K 3.5 3.6  CL 111 115*  CO2 20* 19*  GLUCOSE 128* 201*  BUN 17 16  CREATININE 1.16* 1.07*  CALCIUM 8.9 9.4    Impression / Plan:   Impression: 1.  Anemia: Hemoglobin 9.7 at admission--> 9.0 -->8.1--> 6.4-->2u prbc-->8.0-->7.2, FOBT positive, history of chronic macrocytosis, describes some "black stools", though nursing facility does not have report of this, [id number] and folic acid checked 0000000 and normal, iron studies checked 9/18 normal, transfused a unit of PRBCs on 9/17 her hemoglobin came up to 8 but again dropped to 7.2, patient denies any overt GI bleeding; consider relation to known CKD+/- more likely upper GI source given vague history of black stools had ulcers 2.  Diabetes type 2 3.  Chest pain/tachycardia 4.  CKD stage III  5.  CAD: On aspirin 6.  GERD: Currently on Pantoprazole 40 mg BID  Plan: 1.  Discussed with patient I recommend an EGD and colonoscopy for further evaluation of this ongoing anemia and history of black stools.  We discussed risks, benefits, limitations and alternatives and the patient declines these procedures today.  She is aware that there is risk of underlying GI cancer causing this anemia, but "I do not want to know". 2.  Recommend keeping her on Pantoprazole 40 mg twice daily 3.  Given recently normal [id number] and folic acid studies as well as iron studies, could consider further  evaluation of the bone marrow 4.  We will sign off as patient is declining or assistance.  Please call if we can be of further help or if she changes her mind  Thank you for your kind consultation.  Lavone Nian [facility]  06/25/2021, 12:25 PM

## 2021-06-25 NOTE — Progress Notes (Signed)
PROGRESS NOTE    [name]  [id number] DOB: 1951-01-01 DOA: 06/23/2021 PCP: Biagio Borg, MD   Chief Complain: Fast heartbeat.  Brief Narrative: Patient is a 70 year old female with history of anxiety, depression, osteoarthritis, chronic back pain, CKD stage IIIa, coronary disease, diabetes type 2, peptic ulcer disease with gastric ulcer, gastroparesis, hyperlipidemia, hypothyroidism, chronic anemia who presented to the emergency department after she was referred by her PCP for the evaluation of fast heartbeat.  On presentation she looked dehydrated and was started on IV fluids.  Hospital course remarkable for drop in the hemoglobin in the range of 6.  Transfused with a unit of PRBC on 06/23/21. FOBT positive,GI consulted today.  PT/OT recommending SN facility on discharge.  Assessment & Plan:   Principal Problem:   Lactic acidosis Active Problems:   HLD (hyperlipidemia)   Macrocytic anemia   Depression   Coronary atherosclerosis   GERD   Hypothyroidism   CKD (chronic kidney disease), stage III (HCC)   Essential (primary) hypertension   Insulin dependent type 2 diabetes mellitus (HCC)   Leukocytosis   Generalized weakness   Tachycardia   Acute on chronic macrocytic anemia: Her baseline hemoglobin is in the range of 8-10.  Hemoglobin suddenly dropped from 9 to 6.4 during this hospitalization .    She has chronic macrocytosis.  FOBT positive patient is a very poor historian but said she had black stools but as per the report of nursing facility where is recently was , she  did not have melena or bloody stools.  Denies any abdominal pain.  Vitamin 123456 and folic acid level checked on 05/18/2021 are normal.  She was transfused with a unit of PRBC on 06/23/21, hemoglobin came up to 8 ,but again dropped to 7.2. [facility] consulted  Tachycardia: Main reason for presenting to the emergency department.  Currently she is in normal sinus rhythm.  Not tachycardic.  Suspected to be  dehydrated on presentation.  Lactic acidosis: Resolved with IV fluids  Confusion: Patient has cognitive impairment at baseline now for a while.  No documented history of dementia.  Urinalysis did not show any signs of UTI.  MRI cudnt be done because she has a neurostimulator in her back.  Extensive work-up was done about her encephalopathy when she was admitted on July 2022 at [facility].  Neurology was also consulted during that admission and neurology recommend outpatient follow-up.  Chest pain: Resolved.  Atypical chest pain, mild elevation of troponin, flat trend.  Leukocytosis: Resolved  Depression/anxiety: On Prozac at home  History of coronary artery disease: She was on aspirin.  Will hold for now due to drop in the hemoglobin  Hypertension: Blood pressure has been stable after restarted on home medicines.  Continue to monitor blood pressure.  On metoprolol, amlodipine, Imdur.  Was also on losartan/hydrochlorothiazide at home, currently on hold,we will resume if she remains hypertensive  GERD: Continue Protonix  Hypothyroidism: Continue Synthyroid  CKD stage IIIa: Currently kidney function at baseline  Constipation: Continue aggressive bowel regimen  Diabetes type 2: On insulin at home. Continue current insulin regimen.  Monitor blood sugars.  Debility/deconditioning: Patient seen by PT/OT recommended SNF on DC.  Patient is chronically wheelchair-bound.  Recently she had right femur fracture, status post ORIF.Daughter wants her to be placed in SNF.  TOC following.  Morbid obesity: BMI of 40.6           DVT prophylaxis:SCD Code Status: Full Family Communication: Daughter on phone on 06/25/2021 Status  is: Inpatient  Remains inpatient appropriate because:Inpatient level of care appropriate due to severity of illness  Dispo: The patient is from: Home              Anticipated d/c is to: SNF              Patient currently is not medically stable to d/c.   Difficult  to place patient No     Consultants: None  Procedures:None  Antimicrobials:  Anti-infectives (From admission, onward)    None       Subjective:  Patient seen and examined the bedside this morning.  Hemodynamically stable.  Denies any new complaints.  Alert and awake but confused.  Not in any kind of distress.   Objective: Vitals:   06/24/21 0600 06/24/21 1031 06/24/21 1450 06/24/21 2120  BP: (!) 151/75 (!) 151/89 140/75 (!) 155/83  Pulse: 100 (!) 103 (!) 105 91  Resp: '20  18 18  '$ Temp: 99.5 F (37.5 C)  98.7 F (37.1 C) 98.3 F (36.8 C)  TempSrc: Axillary  Oral Oral  SpO2: 100%  100% 99%  Weight:      Height:        Intake/Output Summary (Last 24 hours) at 06/25/2021 0821 Last data filed at 06/25/2021 [id number] Gross per 24 hour  Intake 420 ml  Output 400 ml  Net 20 ml   Filed Weights   06/21/2021 1800 06/21/21 1101 06/21/21 1403  Weight: 108.9 kg 108.9 kg 110.8 kg    Examination:  General exam: Overall comfortable, not in distress, obese, deconditioned HEENT: PERRL Respiratory system:  no wheezes or crackles  Cardiovascular system: S1 & S2 heard, RRR.  Gastrointestinal system: Abdomen is nondistended, soft and nontender. Central nervous system: Alert and awake but not oriented Extremities: No edema, no clubbing ,no cyanosis Skin: No rashes, no ulcers,no icterus       Data Reviewed: I have personally reviewed following labs and imaging studies  CBC: Recent Labs  Lab 06/19/2021 0054 06/24/2021 1930 06/21/21 0458 06/23/21 0823 06/24/21 1004 06/25/21 0416  WBC 13.2* 12.9* 11.0* 10.8* 12.5* 10.2  NEUTROABS 11.6* 10.8*  --   --  9.1* 5.9  HGB 9.7* 9.0* 8.1* 6.4* 8.0* 7.2*  HCT 31.0* 29.1* 25.6* 20.1* 23.9* 21.6*  MCV 106.2* 107.8* 105.8* 105.8* 97.2 97.7  PLT 326 372 292 290 312 [id number]   Basic Metabolic Panel: Recent Labs  Lab 06/07/2021 0054 06/29/2021 1930 06/21/21 0458 06/23/21 0823 06/24/21 1004  NA 135 137 137 138 142  K 4.2 3.8 3.5 3.5 3.6  CL  106 106 108 111 115*  CO2 16* 17* 19* 20* 19*  GLUCOSE 170* 240* 206* 128* 201*  BUN '15 16 14 17 16  '$ CREATININE 0.91 1.21* 1.04* 1.16* 1.07*  CALCIUM 9.5 9.8 9.4 8.9 9.4  MG  --   --  1.6* 1.9  --   PHOS  --   --  2.8  --   --    GFR: Estimated Creatinine Clearance: 59.8 mL/min (A) (by C-G formula based on SCr of 1.07 mg/dL (H)). Liver Function Tests: Recent Labs  Lab 06/08/2021 0054 06/21/21 0458  AST 25 20  ALT 17 15  ALKPHOS 54 54  BILITOT 1.1 0.8  PROT 7.3 6.4*  ALBUMIN 3.4* 3.1*   Recent Labs  Lab 06/22/2021 0054  LIPASE 22   No results for input(s): AMMONIA in the last 168 hours. Coagulation Profile: Recent Labs  Lab 06/24/2021 0054  INR 1.2   Cardiac Enzymes:  Recent Labs  Lab 07/06/2021 0054  CKTOTAL 111   BNP (last 3 results) No results for input(s): PROBNP in the last 8760 hours. HbA1C: No results for input(s): HGBA1C in the last 72 hours. CBG: Recent Labs  Lab 06/23/21 0742 06/23/21 1205 06/23/21 1648 06/23/21 2050 06/25/21 0731  GLUCAP 132* 125* 136* 195* 121*   Lipid Profile: No results for input(s): CHOL, HDL, LDLCALC, TRIG, CHOLHDL, LDLDIRECT in the last 72 hours. Thyroid Function Tests: No results for input(s): TSH, T4TOTAL, FREET4, T3FREE, THYROIDAB in the last 72 hours. Anemia Panel: Recent Labs    06/24/21 1004  FERRITIN 129  TIBC 264  IRON 54   Sepsis Labs: Recent Labs  Lab 06/25/2021 0304 07/01/2021 1930 07/03/2021 2216 06/21/21 0458  LATICACIDVEN 2.3* 2.7* 2.3* 1.9    Recent Results (from the past 240 hour(s))  Culture, blood (Routine x 2)     Status: None (Preliminary result)   Collection Time: 06/22/2021 12:54 AM   Specimen: BLOOD RIGHT HAND  Result Value Ref Range Status   Specimen Description   Final    BLOOD RIGHT HAND Performed at [facility] [address] [place], [place] [address]    Special Requests   Final    BOTTLES DRAWN AEROBIC AND ANAEROBIC Blood Culture results may not be optimal due to  an inadequate volume of blood received in culture bottles Performed at [facility] [address], [place], [place] [address]    Culture   Final    NO GROWTH 4 DAYS Performed at [facility], [place] [address], [place], [place] [address]    Report Status PENDING  Incomplete  Culture, blood (Routine x 2)     Status: None (Preliminary result)   Collection Time: 06/11/2021  9:00 PM   Specimen: BLOOD  Result Value Ref Range Status   Specimen Description   Final    BLOOD BLOOD LEFT HAND Performed at [facility] [address], [place], [place] [address]    Special Requests   Final    BOTTLES DRAWN AEROBIC AND ANAEROBIC Blood Culture adequate volume Performed at [facility] [address] [place], [place] [address]    Culture   Final    NO GROWTH 4 DAYS Performed at [facility], [place] [address], [place], [place] [address]    Report Status PENDING  Incomplete  Culture, blood (routine x 2)     Status: None (Preliminary result)   Collection Time: 06/10/2021  9:00 PM   Specimen: BLOOD  Result Value Ref Range Status   Specimen Description   Final    BLOOD BLOOD RIGHT HAND Performed at [facility] [address], [place], [place] [address]    Special Requests   Final    BOTTLES DRAWN AEROBIC AND ANAEROBIC Blood Culture adequate volume Performed at [facility] [address] [place],  [address]    Culture   Final    NO GROWTH 4 DAYS Performed at [facility], [place] [address], [place],  [address]    Report Status PENDING  Incomplete         Radiology Studies: No results found.      Scheduled Meds:  amLODipine  10 mg Oral Daily   atorvastatin  40 mg Oral QHS   FLUoxetine  20 mg Oral Daily   insulin aspart  0-20 Units Subcutaneous TID WC   insulin glargine-yfgn  10 Units  Subcutaneous Daily   isosorbide mononitrate  60 mg Oral Daily    levothyroxine  50 mcg Oral Q0600   linaclotide  72 mcg Oral QAC breakfast   LORazepam  0.5 mg Intravenous Once   megestrol  400 mg Oral Daily   metoprolol succinate  50 mg Oral Daily   polyethylene glycol  17 g Oral Daily   Continuous Infusions:     LOS: 4 days    Time spent: 35 mins.More than 50% of that time was spent in counseling and/or coordination of care.      Shelly Coss, MD [facility] P9/19/2022, 8:21 AM

## 2021-06-25 NOTE — Care Management Important Message (Signed)
Important Message  Patient Details IM Letter given to the Patient. Name: [name] MRN: [id number] Date of Birth: 12-18-1950   Medicare Important Message Given:  Yes     Kerin Salen 06/25/2021, 12:55 PM

## 2021-06-26 DIAGNOSIS — D649 Anemia, unspecified: Secondary | ICD-10-CM | POA: Diagnosis not present

## 2021-06-26 DIAGNOSIS — E872 Acidosis: Secondary | ICD-10-CM | POA: Diagnosis not present

## 2021-06-26 DIAGNOSIS — R195 Other fecal abnormalities: Secondary | ICD-10-CM | POA: Diagnosis not present

## 2021-06-26 LAB — CBC WITH DIFFERENTIAL/PLATELET
Abs Immature Granulocytes: 0.17 10*3/uL — ABNORMAL HIGH (ref 0.00–0.07)
Basophils Absolute: 0 10*3/uL (ref 0.0–0.1)
Basophils Relative: 0 %
Eosinophils Absolute: 0.3 10*3/uL (ref 0.0–0.5)
Eosinophils Relative: 2 %
HCT: 25.7 % — ABNORMAL LOW (ref 36.0–46.0)
Hemoglobin: 8.2 g/dL — ABNORMAL LOW (ref 12.0–15.0)
Immature Granulocytes: 2 %
Lymphocytes Relative: 26 %
Lymphs Abs: 3 10*3/uL (ref 0.7–4.0)
MCH: 32.3 pg (ref 26.0–34.0)
MCHC: 31.9 g/dL (ref 30.0–36.0)
MCV: 101.2 fL — ABNORMAL HIGH (ref 80.0–100.0)
Monocytes Absolute: 0.6 10*3/uL (ref 0.1–1.0)
Monocytes Relative: 5 %
Neutro Abs: 7.5 10*3/uL (ref 1.7–7.7)
Neutrophils Relative %: 65 %
Platelets: 310 10*3/uL (ref 150–400)
RBC: 2.54 MIL/uL — ABNORMAL LOW (ref 3.87–5.11)
RDW: 18.2 % — ABNORMAL HIGH (ref 11.5–15.5)
WBC: 11.5 10*3/uL — ABNORMAL HIGH (ref 4.0–10.5)
nRBC: 0.8 % — ABNORMAL HIGH (ref 0.0–0.2)

## 2021-06-26 LAB — GLUCOSE, CAPILLARY
Glucose-Capillary: 133 mg/dL — ABNORMAL HIGH (ref 70–99)
Glucose-Capillary: 109 mg/dL — ABNORMAL HIGH (ref 70–99)
Glucose-Capillary: 150 mg/dL — ABNORMAL HIGH (ref 70–99)
Glucose-Capillary: 101 mg/dL — ABNORMAL HIGH (ref 70–99)

## 2021-06-26 MED ORDER — PEG-KCL-NACL-NASULF-NA ASC-C 100 G PO SOLR: 1.0000 | Freq: Once | ORAL | Status: DC

## 2021-06-26 MED ORDER — PEG-KCL-NACL-NASULF-NA ASC-C 100 G PO SOLR: 0.5000 | Freq: Once | ORAL | Status: DC

## 2021-06-26 MED ORDER — METOCLOPRAMIDE HCL 5 MG/ML IJ SOLN
10.0000 mg | INTRAMUSCULAR | Status: AC
  Administered 2021-06-26 (×2): 10 mg via INTRAVENOUS
  Filled 2021-06-26 (×2): qty 2

## 2021-06-26 MED ORDER — POLYETHYLENE GLYCOL 3350 17 GM/SCOOP PO POWD
0.5000 | Freq: Once | ORAL | Status: DC
  Filled 2021-06-26: qty 255

## 2021-06-26 MED ORDER — PEG-KCL-NACL-NASULF-NA ASC-C 100 G PO SOLR
0.5000 | Freq: Once | ORAL | Status: AC
  Administered 2021-06-26: 100 g via ORAL
  Filled 2021-06-26: qty 1

## 2021-06-26 MED ORDER — POLYETHYLENE GLYCOL 3350 17 GM/SCOOP PO POWD: 0.5000 | Freq: Once | ORAL | Status: DC

## 2021-06-26 NOTE — Progress Notes (Signed)
    Progress Note   Subjective  Chief Complaint: Anemia, Hemoccult positive stool  This morning, patient is found laying in bed about to have her bath, denies any new complaints or concerns.     Objective   Vital signs in last 24 hours: Temp:  [98.5 F (36.9 C)-98.9 F (37.2 C)] 98.9 F (37.2 C) (09/20 0636) Pulse Rate:  [75-87] 84 (09/20 0934) Resp:  [10-16] 16 (09/20 0636) BP: (128-149)/(68-88) 135/74 (09/20 0934) SpO2:  [100 %] 100 % (09/20 0636) Last BM Date: 06/25/21 General:    AA female in NAD Heart:  Regular rate and rhythm; no murmurs Lungs: Respirations even and unlabored, lungs CTA bilaterally Abdomen:  Soft, nontender and nondistended. Normal bowel sounds. Psych:  Cooperative. Normal mood and affect.  Intake/Output from previous day: 09/19 0701 - 09/20 0700 In: 1 [P.O.:590] Out: 400 [Urine:400]   Lab Results: Recent Labs    06/24/21 1004 06/25/21 0416 06/26/21 0813  WBC 12.5* 10.2 11.5*  HGB 8.0* 7.2* 8.2*  HCT 23.9* 21.6* 25.7*  PLT 312 281 310   BMET Recent Labs    06/24/21 1004  NA 142  K 3.6  CL 115*  CO2 19*  GLUCOSE 201*  BUN 16  CREATININE 1.07*  CALCIUM 9.4     Assessment / Plan:   Assessment: 1.  Anemia: Hemoglobin 9.7 at admission--> 9.0--> 8.1--> 6.4--> 2 units PRBCs--> 8.0--> 7.2--> 8.2, FOBT positive, history of chronic macrocytosis, describes some "black stools", 123456 and folic acid as well as iron studies normal, overnight hemoglobin remaining stable (question if 7.2 was invalid); consider GI source of blood loss 2.  Diabetes type 2 3.  Chest pain/tachycardia-resolved  4.  CKD stage III 5.  CAD 6.  GERD  Plan: 1.  Patient had previously declined EGD and colonoscopy yesterday.  When asked this morning she said "I guess I will have them".  Went ahead and scheduled patient for an EGD and colonoscopy tomorrow 9/21 with Dr. Havery Moros. 2.  Patient will be on a clear liquid diet today and n.p.o. at midnight 3.  Patient will  start movi prep at 3:00 in split dose fashion.  Also ordered Reglan 10 mg to be given 20 to 30 minutes before each half of prep 4.  Continue to monitor hemoglobin with transfusion as needed  Thank you for your kind consultation, we will continue to follow.    LOS: 5 days   Levin Erp  06/26/2021, 9:40 AM
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
## 2021-06-26 NOTE — Progress Notes (Signed)
OT Cancellation Note  Patient Details Name: [name] MRN: [id number] DOB: [birth year]   Cancelled Treatment:    Reason Eval/Treat Not Completed: Patient declined, no reason specified patient reported feeling nauseated and unable to participate in any tasks at this time. Will continue to follow.  [name] OTR/L, [facility] Acute Rehabilitation Department Office# [phone] Pager# [phone]    06/26/2021, 2:07 PM

## 2021-06-26 NOTE — Progress Notes (Signed)
Patient has been on and off the bed pan 4 times this shift, she also has a purewick in place, pt has not done anything. Pt is insistent on getting out of the bed to toilet, however per physical therapies note she is not capable nor is it safe for her to get out of the bed.  RN and NT have explained that to the her but she continues to hold both her bowel and bladder

## 2021-06-26 NOTE — Progress Notes (Signed)
Patient have no urine output over 18hrs, in and out cath patient 800cc out. Dr. Tawanna Solo informed. Will continue to assess patient.

## 2021-06-26 NOTE — Progress Notes (Signed)
PROGRESS NOTE    [name]  [id number] DOB: [birth year] DOA: 06/28/2021 PCP: Biagio Borg, MD   Chief Complain: Fast heartbeat.  Brief Narrative: Patient is a 70 year old female with history of anxiety, depression, osteoarthritis, chronic back pain, CKD stage IIIa, coronary disease, diabetes type 2, peptic ulcer disease with gastric ulcer, gastroparesis, hyperlipidemia, hypothyroidism, chronic anemia who presented to the emergency department after she was referred by her PCP for the evaluation of fast heartbeat.  On presentation she looked dehydrated and was started on IV fluids.  Hospital course remarkable for drop in the hemoglobin in the range of 6.  Transfused with a unit of PRBC on 06/23/21. FOBT positive,GI consulted , plan for EGD/colonoscopy tomorrow.  PT/OT recommending SN facility on discharge.  Assessment & Plan:   Principal Problem:   Lactic acidosis Active Problems:   HLD (hyperlipidemia)   Anemia   Depression   Coronary atherosclerosis   GERD   Hypothyroidism   CKD (chronic kidney disease), stage III (HCC)   Essential (primary) hypertension   Insulin dependent type 2 diabetes mellitus (HCC)   Leukocytosis   Generalized weakness   Tachycardia   Heme positive stool   Acute on chronic macrocytic anemia: Her baseline hemoglobin is in the range of 8-10.  Hemoglobin suddenly dropped from 9 to 6.4 during this hospitalization .    She has chronic macrocytosis.  FOBT positive patient is a very poor historian but said she had black stools but as per the report of nursing facility where is recently was , she  did not have melena or bloody stools.  Denies any abdominal pain.  Vitamin [id number] and folic acid level checked on 05/18/2021 are normal.  She was transfused with a unit of PRBC on 06/23/21.GI consulted,plan for EGD and colonoscopy tomorrow  Cognitive impairment/confusion: Patient has cognitive impairment at baseline now for a while.  No documented history of dementia.   Urinalysis did not show any signs of UTI.  MRI cudnt be done because she has a neurostimulator in her back.  Extensive work-up was done about her encephalopathy when she was admitted on July 2022 at [facility].  Neurology was also consulted during that admission and neurology recommend outpatient follow-up.  Chest pain: Resolved.  Atypical chest pain, mild elevation of troponin, flat trend.  Leukocytosis: Resolved  Depression/anxiety: On Prozac at home  History of coronary artery disease: She was on aspirin.  Will hold for now due to drop in the hemoglobin  Hypertension: Blood pressure has been stable after restarted on home medicines.  Continue to monitor blood pressure.  On metoprolol, amlodipine, Imdur.  Was also on losartan/hydrochlorothiazide at home, currently on hold,we will resume if she remains hypertensive  GERD: Continue Protonix  Hypothyroidism: Continue Synthyroid  CKD stage IIIa: Currently kidney function at baseline  Constipation: Continue aggressive bowel regimen  Diabetes type 2: On insulin at home. Continue current insulin regimen.  Monitor blood sugars.  Debility/deconditioning: Patient seen by PT/OT recommended SNF on DC.  Patient is chronically wheelchair-bound.  Recently she had right femur fracture, status post ORIF.Daughter wants her to be placed in SNF.  TOC following.  Morbid obesity: BMI of 40.6           DVT prophylaxis:SCD Code Status: Full Family Communication: Daughter on phone on 06/25/2021 Status is: Inpatient  Remains inpatient appropriate because:Inpatient level of care appropriate due to severity of illness  Dispo: The patient is from: Home  Anticipated d/c is to: SNF              Patient currently is not medically stable to d/c.   Difficult to place patient No     Consultants: None  Procedures:None  Antimicrobials:  Anti-infectives (From admission, onward)    None       Subjective:  Patient seen and  examined at bedside this morning.  Hemodynamically stable.  Denies any new complaints.  Confused but obeys commands, alert and awake   Objective: Vitals:   06/25/21 1003 06/25/21 1339 06/25/21 1955 06/26/21 0636  BP: (!) 149/88 136/70 132/68 128/68  Pulse: 86 87 75 85  Resp:  '10 16 16  '$ Temp:  98.5 F (36.9 C) 98.7 F (37.1 C) 98.9 F (37.2 C)  TempSrc:  Oral    SpO2:  100% 100% 100%  Weight:      Height:        Intake/Output Summary (Last 24 hours) at 06/26/2021 0830 Last data filed at 06/25/2021 2120 Gross per 24 hour  Intake 590 ml  Output 400 ml  Net 190 ml   Filed Weights   06/12/2021 1800 06/21/21 1101 06/21/21 1403  Weight: 108.9 kg 108.9 kg 110.8 kg    Examination:  General exam: Overall comfortable, not in distress, pleasantly confused, deconditioned, obese HEENT: PERRL Respiratory system:  no wheezes or crackles  Cardiovascular system: S1 & S2 heard, RRR.  Gastrointestinal system: Abdomen is nondistended, soft and nontender. Central nervous system: Alert and awake but not oriented Extremities: No edema, no clubbing ,no cyanosis Skin: No rashes, no ulcers,no icterus     Data Reviewed: I have personally reviewed following labs and imaging studies  CBC: Recent Labs  Lab 06/12/2021 0054 07/05/2021 1930 06/21/21 0458 06/23/21 0823 06/24/21 1004 06/25/21 0416  WBC 13.2* 12.9* 11.0* 10.8* 12.5* 10.2  NEUTROABS 11.6* 10.8*  --   --  9.1* 5.9  HGB 9.7* 9.0* 8.1* 6.4* 8.0* 7.2*  HCT 31.0* 29.1* 25.6* 20.1* 23.9* 21.6*  MCV 106.2* 107.8* 105.8* 105.8* 97.2 97.7  PLT 326 372 292 290 312 [id number]   Basic Metabolic Panel: Recent Labs  Lab 07/05/2021 0054 06/26/2021 1930 06/21/21 0458 06/23/21 0823 06/24/21 1004  NA 135 137 137 138 142  K 4.2 3.8 3.5 3.5 3.6  CL 106 106 108 111 115*  CO2 16* 17* 19* 20* 19*  GLUCOSE 170* 240* 206* 128* 201*  BUN '15 16 14 17 16  '$ CREATININE 0.91 1.21* 1.04* 1.16* 1.07*  CALCIUM 9.5 9.8 9.4 8.9 9.4  MG  --   --  1.6* 1.9  --   PHOS   --   --  2.8  --   --    GFR: Estimated Creatinine Clearance: 59.8 mL/min (A) (by C-G formula based on SCr of 1.07 mg/dL (H)). Liver Function Tests: Recent Labs  Lab 07/03/2021 0054 06/21/21 0458  AST 25 20  ALT 17 15  ALKPHOS 54 54  BILITOT 1.1 0.8  PROT 7.3 6.4*  ALBUMIN 3.4* 3.1*   Recent Labs  Lab 06/22/2021 0054  LIPASE 22   No results for input(s): AMMONIA in the last 168 hours. Coagulation Profile: Recent Labs  Lab 06/17/2021 0054  INR 1.2   Cardiac Enzymes: Recent Labs  Lab 06/12/2021 0054  CKTOTAL 111   BNP (last 3 results) No results for input(s): PROBNP in the last 8760 hours. HbA1C: No results for input(s): HGBA1C in the last 72 hours. CBG: Recent Labs  Lab 06/23/21 2050 06/25/21 0731  06/25/21 1140 06/25/21 1605 06/26/21 0722  GLUCAP 195* 121* 176* 158* 133*   Lipid Profile: No results for input(s): CHOL, HDL, LDLCALC, TRIG, CHOLHDL, LDLDIRECT in the last 72 hours. Thyroid Function Tests: No results for input(s): TSH, T4TOTAL, FREET4, T3FREE, THYROIDAB in the last 72 hours. Anemia Panel: Recent Labs    06/24/21 1004  FERRITIN 129  TIBC 264  IRON 54   Sepsis Labs: Recent Labs  Lab 07/01/2021 0304 06/24/2021 1930 06/15/2021 2216 06/21/21 0458  LATICACIDVEN 2.3* 2.7* 2.3* 1.9    Recent Results (from the past 240 hour(s))  Culture, blood (Routine x 2)     Status: None   Collection Time: 07/06/2021 12:54 AM   Specimen: BLOOD RIGHT HAND  Result Value Ref Range Status   Specimen Description   Final    BLOOD RIGHT HAND Performed at [facility] [address] [place], [place] [address]    Special Requests   Final    BOTTLES DRAWN AEROBIC AND ANAEROBIC Blood Culture results may not be optimal due to an inadequate volume of blood received in culture bottles Performed at [facility] [address], [place], [place] [address]    Culture   Final    NO GROWTH 5 DAYS Performed at [facility], [place] [address] [place], [place] [address]    Report Status 06/25/2021 FINAL  Final  Culture, blood (Routine x 2)     Status: None   Collection Time: 07/05/2021  9:00 PM   Specimen: BLOOD  Result Value Ref Range Status   Specimen Description   Final    BLOOD BLOOD LEFT HAND Performed at [facility] [address], [place], [place] [address]    Special Requests   Final    BOTTLES DRAWN AEROBIC AND ANAEROBIC Blood Culture adequate volume Performed at [facility] [address] [place], [place] [address]    Culture   Final    NO GROWTH 5 DAYS Performed at [facility], [place] [address], [place], [place] [address]    Report Status 06/25/2021 FINAL  Final  Culture, blood (routine x 2)     Status: None   Collection Time: 06/29/2021  9:00 PM   Specimen: BLOOD  Result Value Ref Range Status   Specimen Description   Final    BLOOD BLOOD RIGHT HAND Performed at [facility] [address], [place], [place] [address]    Special Requests   Final    BOTTLES DRAWN AEROBIC AND ANAEROBIC Blood Culture adequate volume Performed at [facility] [address], [place], [place] [address]    Culture   Final    NO GROWTH 5 DAYS Performed at [facility], [place] [address], [place], [place] [address]    Report Status 06/25/2021 FINAL  Final         Radiology Studies: No results found.      Scheduled Meds:  amLODipine  10 mg Oral Daily   atorvastatin  40 mg Oral QHS   FLUoxetine  20 mg Oral Daily   insulin aspart  0-20 Units Subcutaneous TID WC   insulin glargine-yfgn  10 Units Subcutaneous Daily   isosorbide mononitrate  60 mg Oral Daily   levothyroxine  50 mcg Oral Q0600   linaclotide  72 mcg Oral QAC breakfast   LORazepam  0.5 mg Intravenous Once   megestrol  400 mg Oral Daily   metoprolol succinate  50 mg Oral Daily   pantoprazole (PROTONIX) IV  40 mg Intravenous Q12H   polyethylene glycol  17 g  Oral Daily   Continuous Infusions:     LOS: 5 days    Time spent: 25 mins.More than 50% of that time was spent in counseling and/or coordination of care.      Shelly Coss, MD [facility] P9/20/2022, 8:30 AM

## 2021-06-26 NOTE — Progress Notes (Signed)
Patient unable to drink up to 60cc of the movieprep, refusing to take any more, [name] with GI notified, no new order given, continue to offer prep to patient.

## 2021-06-26 NOTE — Significant Event (Signed)
2306 Call from ED desk pt had one of her children attempting to gain access.  This RN is unsure of who the visitor was, Visitor had password, wanted to know the patients room number.    Spoke with patient that she has 3 children, 2 daughters and 1 Son.  She provided the names of the son and one daughter Proofreader) who was visiting the patient when this RN took over care.  Visitor hours we over, RN would not allow visitors up per policy, Charge nurse aware.  Despite the attempted visitor knowing the security password, the room number was not given out.

## 2021-06-26 NOTE — Progress Notes (Signed)
Patient continuing to refuse to take any of the prep for procedures tomorrow. [name] patient's daughter called and is coming up to stay with her mother and see if she can help with the situation.  Patient is very confused and refusing most therapies at this time.

## 2021-06-27 DIAGNOSIS — R195 Other fecal abnormalities: Secondary | ICD-10-CM | POA: Diagnosis not present

## 2021-06-27 DIAGNOSIS — E872 Acidosis: Secondary | ICD-10-CM | POA: Diagnosis not present

## 2021-06-27 DIAGNOSIS — D649 Anemia, unspecified: Secondary | ICD-10-CM | POA: Diagnosis not present

## 2021-06-27 LAB — BASIC METABOLIC PANEL
Anion gap: 10 (ref 5–15)
BUN: 14 mg/dL (ref 8–23)
CO2: 21 mmol/L — ABNORMAL LOW (ref 22–32)
Calcium: 9.3 mg/dL (ref 8.9–10.3)
Chloride: 108 mmol/L (ref 98–111)
Creatinine, Ser: 0.97 mg/dL (ref 0.44–1.00)
GFR, Estimated: >60 mL/min (ref >60)
Glucose, Bld: 108 mg/dL — ABNORMAL HIGH (ref 70–99)
Potassium: 4.1 mmol/L (ref 3.5–5.1)
Sodium: 139 mmol/L (ref 135–145)

## 2021-06-27 LAB — CBC WITH DIFFERENTIAL/PLATELET
Abs Immature Granulocytes: 0.28 10*3/uL — ABNORMAL HIGH (ref 0.00–0.07)
Basophils Absolute: 0.1 10*3/uL (ref 0.0–0.1)
Basophils Relative: 1 %
Eosinophils Absolute: 0.3 10*3/uL (ref 0.0–0.5)
Eosinophils Relative: 2 %
HCT: 27.4 % — ABNORMAL LOW (ref 36.0–46.0)
Hemoglobin: 8.8 g/dL — ABNORMAL LOW (ref 12.0–15.0)
Immature Granulocytes: 2 %
Lymphocytes Relative: 24 %
Lymphs Abs: 2.8 10*3/uL (ref 0.7–4.0)
MCH: 32.4 pg (ref 26.0–34.0)
MCHC: 32.1 g/dL (ref 30.0–36.0)
MCV: 100.7 fL — ABNORMAL HIGH (ref 80.0–100.0)
Monocytes Absolute: 0.8 10*3/uL (ref 0.1–1.0)
Monocytes Relative: 6 %
Neutro Abs: 7.5 10*3/uL (ref 1.7–7.7)
Neutrophils Relative %: 65 %
Platelets: 345 10*3/uL (ref 150–400)
RBC: 2.72 MIL/uL — ABNORMAL LOW (ref 3.87–5.11)
RDW: 18.5 % — ABNORMAL HIGH (ref 11.5–15.5)
WBC: 11.6 10*3/uL — ABNORMAL HIGH (ref 4.0–10.5)
nRBC: 0.3 % — ABNORMAL HIGH (ref 0.0–0.2)

## 2021-06-27 LAB — GLUCOSE, CAPILLARY
Glucose-Capillary: 112 mg/dL — ABNORMAL HIGH (ref 70–99)
Glucose-Capillary: 115 mg/dL — ABNORMAL HIGH (ref 70–99)
Glucose-Capillary: 126 mg/dL — ABNORMAL HIGH (ref 70–99)

## 2021-06-27 MED ORDER — PEG-KCL-NACL-NASULF-NA ASC-C 100 G PO SOLR: 0.5000 | Freq: Once | ORAL | Status: DC

## 2021-06-27 MED ORDER — PEG-KCL-NACL-NASULF-NA ASC-C 100 G PO SOLR: 1.0000 | Freq: Once | ORAL | Status: DC

## 2021-06-27 MED ORDER — PEG-KCL-NACL-NASULF-NA ASC-C 100 G PO SOLR
0.5000 | Freq: Once | ORAL | Status: AC
  Administered 2021-06-27: 100 g via ORAL
  Filled 2021-06-27: qty 1
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
## 2021-06-27 NOTE — Plan of Care (Signed)
  Problem: Clinical Measurements: Goal: Ability to maintain clinical measurements within normal limits will improve Outcome: Progressing Goal: Will remain free from infection Outcome: Progressing Goal: Diagnostic test results will improve Outcome: Progressing Goal: Cardiovascular complication will be avoided Outcome: Progressing   Problem: Nutrition: Goal: Adequate nutrition will be maintained Outcome: Progressing   Problem: Elimination: Goal: Will not experience complications related to urinary retention Outcome: Progressing   Problem: Education: Goal: Knowledge of General Education information will improve Description: Including pain rating scale, medication(s)/side effects and non-pharmacologic comfort measures Outcome: Not Progressing   Problem: Health Behavior/Discharge Planning: Goal: Ability to manage health-related needs will improve Outcome: Not Progressing   Problem: Clinical Measurements: Goal: Respiratory complications will improve Outcome: Not Progressing   Problem: Activity: Goal: Risk for activity intolerance will decrease Outcome: Not Progressing   Problem: Coping: Goal: Level of anxiety will decrease Outcome: Not Progressing   Problem: Elimination: Goal: Will not experience complications related to bowel motility Outcome: Not Progressing   Problem: Pain Managment: Goal: General experience of comfort will improve Outcome: Not Progressing   Problem: Safety: Goal: Ability to remain free from injury will improve Outcome: Not Progressing   Problem: Skin Integrity: Goal: Risk for impaired skin integrity will decrease Outcome: Not Progressing

## 2021-06-27 NOTE — [place] FL2 (Signed)
[place] MEDICAID FL2 LEVEL OF CARE SCREENING TOOL     IDENTIFICATION  Patient Name: [name] Birthdate: 1951-02-10 Sex: female Admission Date (Current Location): 06/10/2021  [facility] and [place] Number:  Herbalist and Address:  [facility],  [address] [place], [place]      Provider Number: [id number]  Attending Physician Name and Address:  Shelly Coss, MD  Relative Name and Phone Number:  McNeal,Chrystal Daughter [phone]  [phone]    Current Level of Care: Hospital Recommended Level of Care: [place] Prior Approval Number:    Date Approved/Denied:   PASRR Number: [id number]  Discharge Plan: SNF    Current Diagnoses: Patient Active Problem List   Diagnosis Date Noted   Heme positive stool    Tachycardia 07/06/2021   Lactic acidosis 06/08/2021   Cognitive disorder 05/21/2021   Generalized weakness 05/17/2021   Femur fracture ([place]) 05/01/2021   Bilateral hand pain 05/01/2021   Weight loss 04/15/2021   Leukocytosis 04/15/2021   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   Acute diverticulitis 03/21/2021   Degenerative disc disease, cervical 02/08/2021   Type 2 diabetes mellitus ([place]) 02/02/2021   Acquired hypothyroidism 11/24/2020   Chronic pain 11/24/2020   Severe sepsis ([place]) 11/09/2020   Hyperlipidemia associated with type 2 diabetes mellitus ([place]) 63/10/6008   Acute metabolic encephalopathy 93/23/5573   Acute lower UTI 11/09/2020   Recurrent falls 10/19/2020   Gait disorder 10/11/2020   Fall 10/11/2020   Low grade fever 02/04/2020   Chest pain 01/26/2020   Lumbar post-laminectomy syndrome 10/05/2019   Spinal cord stimulator status 10/05/2019   Body mass index (BMI) 40.0-44.9, adult ([place]) 10/05/2019   Rectal bleeding 09/01/2019   History of peptic ulcer disease 09/01/2019   Screening for viral disease 09/01/2019   Insulin dependent type 2 diabetes mellitus ([place]) 09/01/2019   Pyelonephritis  06/05/2019   Recurrent UTI 06/05/2019   Urinary incontinence 06/05/2019   Nausea & vomiting 02/08/2019   Abdominal pain 02/08/2019   Constipation 02/08/2019   UTI (urinary tract infection) 02/08/2019   LUQ pain 01/21/2019   Cervical radiculopathy at C6 01/28/2018   Left lateral epicondylitis 01/23/2018   Obesity 01/23/2018   Weakness 05/01/2017   Hyperkalemia 05/01/2017   Acute kidney injury superimposed on CKD ([place]) 05/01/2017   Essential (primary) hypertension 05/01/2017   Sepsis ([place]) 05/01/2017   Dysuria 04/23/2017   Right leg swelling 12/27/2016   Leg pain, lateral, right 11/05/2016   Radiculopathy 04/03/2016   Hypersomnolence 11/23/2015   Right sided abdominal pain 07/25/2015   Malaise and fatigue 11/05/2014   Rotator cuff syndrome 07/26/2014   Fracture of fifth metacarpal bone of right hand 07/08/2014   Right cervical radiculopathy 06/27/2014   Chronic interstitial cystitis 05/23/2014   Osteoarthritis of CMC joint of thumb 04/11/2014   De Quervain's tenosynovitis, left 03/14/2014   CKD (chronic kidney disease), stage III ([place]) 03/03/2014   Lower back pain 03/03/2014   Ingrown nail 03/03/2014   Peripheral edema 02/23/2014   Lateral epicondylitis of right elbow 01/31/2014   Neck pain on left side 11/19/2013   Abdominal discomfort 10/12/2012   Right lumbar radiculopathy 05/11/2012   Peripheral neuropathy 05/11/2012   Vertigo 03/29/2012   Headache(784.0) 03/10/2012   Left lower quadrant abdominal pain 10/24/2011   Lumbar radiculopathy 10/24/2011   Hypothyroidism 08/19/2011   Right leg weakness 04/04/2011   Hematochezia 01/07/2011   Preventative health care 01/07/2011   CHOLELITHIASIS 05/04/2009   GERD 04/26/2009   Gastroparesis 04/26/2009  ULCER-GASTRIC 03/15/2009   DIVERTICULOSIS-COLON 03/15/2009   Cervical radiculopathy 10/28/2008   MENOPAUSAL DISORDER 06/29/2008   Pain in joint, shoulder region 10/12/2007   INSOMNIA-SLEEP DISORDER-UNSPEC 09/01/2007    Depression 09/01/2007   Anemia 08/13/2007   COMMON MIGRAINE 08/13/2007   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   Diabetes ([place]) 05/06/2007   HLD (hyperlipidemia) 05/06/2007   ANXIETY 05/06/2007   Hypertension associated with diabetes ([place]) 05/06/2007   Coronary atherosclerosis 05/06/2007    Orientation RESPIRATION BLADDER Height & Weight     Self  Normal Incontinent Weight: 244 lb 4.3 oz (110.8 kg) Height:  _0  (165.1 cm)  BEHAVIORAL SYMPTOMS/MOOD NEUROLOGICAL BOWEL NUTRITION STATUS      Incontinent Diet ([place] modified)  AMBULATORY STATUS COMMUNICATION OF NEEDS Skin   Limited Assist Verbally Normal                       Personal Care Assistance Level of Assistance  Bathing, Dressing, Feeding Bathing Assistance: Limited assistance Feeding assistance: Independent Dressing Assistance: Limited assistance     Functional Limitations Info  Sight, Hearing, Speech Sight Info: Adequate Hearing Info: Adequate Speech Info: Adequate    SPECIAL CARE FACTORS FREQUENCY  PT (By licensed PT), OT (By licensed OT)     PT Frequency: Minimum 5x a week OT Frequency: Minimum 5x a week            Contractures Contractures Info: Not present    Additional Factors Info  Code Status, Allergies, Psychotropic, Insulin Sliding Scale Code Status Info: Full Code Allergies Info: Ciprofloxacin   Hydrocodone   Hydrocortisone   Strawberry (Diagnostic)   Tomato   Penicillins Psychotropic Info: FLUoxetine (PROZAC) capsule 20 mg, Insulin Sliding Scale Info: insulin aspart (novoLOG) injection 0-20 Units 3x a day with meals       Current Medications (06/27/2021):  This is the current hospital active medication list Current Facility-Administered Medications  Medication Dose Route Frequency Provider Last Rate Last Admin   acetaminophen (TYLENOL) tablet 650 mg  650 mg Oral Q6H PRN Reubin Milan, MD   650 mg at 06/26/21 0020   Or   acetaminophen (TYLENOL) suppository 650 mg  650 mg Rectal  Q6H PRN Reubin Milan, MD       amLODipine (NORVASC) tablet 10 mg  10 mg Oral Daily Mariel Aloe, MD   10 mg at 06/26/21 9371   atorvastatin (LIPITOR) tablet 40 mg  40 mg Oral QHS Mariel Aloe, MD   40 mg at 06/26/21 2235   FLUoxetine (PROZAC) capsule 20 mg  20 mg Oral Daily Mariel Aloe, MD   20 mg at 06/26/21 6967   insulin aspart (novoLOG) injection 0-20 Units  0-20 Units Subcutaneous TID WC Reubin Milan, MD   3 Units at 06/26/21 1717   insulin glargine-yfgn (SEMGLEE) injection 10 Units  10 Units Subcutaneous Daily Mariel Aloe, MD   10 Units at 06/27/21 8938   isosorbide mononitrate (IMDUR) 24 hr tablet 60 mg  60 mg Oral Daily Mariel Aloe, MD   60 mg at 06/26/21 1017   levothyroxine (SYNTHROID) tablet 50 mcg  50 mcg Oral Q0600 Mariel Aloe, MD   50 mcg at 06/27/21 5102   linaclotide (LINZESS) capsule 72 mcg  72 mcg Oral QAC breakfast Mariel Aloe, MD   72 mcg at 06/27/21 0924   LORazepam (ATIVAN) injection 0.5 mg  0.5 mg Intravenous Once Neila Gear, NP       megestrol (  MEGACE) 400 MG/10ML suspension 400 mg  400 mg Oral Daily Mariel Aloe, MD   400 mg at 06/26/21 8307   melatonin tablet 5 mg  5 mg Oral QHS PRN Lovey Newcomer T, NP   5 mg at 06/25/21 2120   metoprolol succinate (TOPROL-XL) 24 hr tablet 50 mg  50 mg Oral Daily Mariel Aloe, MD   50 mg at 06/26/21 0934   ondansetron (ZOFRAN) tablet 4 mg  4 mg Oral Q6H PRN Reubin Milan, MD   4 mg at 06/25/21 2236   Or   ondansetron (ZOFRAN) injection 4 mg  4 mg Intravenous Q6H PRN Reubin Milan, MD   4 mg at 06/25/21 1500   pantoprazole (PROTONIX) injection 40 mg  40 mg Intravenous Q12H Shelly Coss, MD   40 mg at 06/27/21 0925   peg 3350 powder (MOVIPREP) kit 100 g  0.5 kit Oral Once Shelly Coss, MD       polyethylene glycol (MIRALAX / GLYCOLAX) packet 17 g  17 g Oral Daily Shelly Coss, MD   17 g at 06/27/21 0925   polyethylene glycol powder (GLYCOLAX/MIRALAX) container 127.5 g   0.5 Container Oral Once Levin Erp, PA         Discharge Medications: Please see discharge summary for a list of discharge medications.  Relevant Imaging Results:  Relevant Lab Results:   Additional Information SSN [id number]  Ross Ludwig, LCSW

## 2021-06-27 NOTE — Progress Notes (Signed)
    Progress Note   Subjective  Chief Complaint: Anemia, Hemoccult positive stool  Today, patient tells me that she was able to drink the bowel prep (I do see some of it still sitting on the computer) and per nursing staff she did not drink any overnight.  They report that she had a solid brown bowel movement this morning.  Do tell me she is only oriented to herself.  Patient denies any new complaints today but does tell me she would like to try drinking the prep again throughout the day today and try for procedures tomorrow.   Objective   Vital signs in last 24 hours: Temp:  [98 F (36.7 C)-98.5 F (36.9 C)] 98 F (36.7 C) (09/20 2120) Pulse Rate:  [79-86] 79 (09/21 0922) Resp:  [14-18] 18 (09/20 2120) BP: (107-129)/(58-72) 125/65 (09/21 0922) SpO2:  [100 %] 100 % (09/20 2120) Last BM Date: 06/26/21 General:    AA female in NAD Heart:  Regular rate and rhythm; no murmurs Lungs: Respirations even and unlabored, lungs CTA bilaterally Abdomen:  Soft, nontender and nondistended. Normal bowel sounds. Psych:  Cooperative. Normal mood and affect.Oriented only to self  Intake/Output from previous day: 09/20 0701 - 09/21 0700 In: 120 [P.O.:120] Out: 1000 [Urine:1000]   Lab Results: Recent Labs    06/25/21 0416 06/26/21 0813 06/27/21 0718  WBC 10.2 11.5* 11.6*  HGB 7.2* 8.2* 8.8*  HCT 21.6* 25.7* 27.4*  PLT 281 310 345   BMET Recent Labs    06/27/21 0718  NA 139  K 4.1  CL 108  CO2 21*  GLUCOSE 108*  BUN 14  CREATININE 0.97  CALCIUM 9.3     Assessment / Plan:   Assessment: 1.  Anemia: Hemoglobin 9.7 at admission--> 9.0--> 8.1--> 6.4--> 2 units PRBCs--> 8.0--> 7.2--> 8.2--> 8.8, FOBT positive at admission, history of chronic macrocytosis, describes "black stools", [id number], folic acid, iron studies all normal; consider GI source versus other 2.  Diabetes type 2 3.  Chest pain/tachycardia-resolved 4.  CKD stage III 5.  CAD 6.  GERD  Plan: 1.  Called patient's  daughter Arloa Koh and discussed options with her.  Daughter would like [place] to try again throughout the day today with the bowel prep as she would prefer her mother have colonoscopy.  Aware that if we are unable to get her to prep today then she will have just an endoscopy tomorrow.  Tells me they are sending people to the hospital to try and help her get this done. 2.  Did discuss this with nursing/wrote a nursing order to try and get bowel prep in today.  Also ordered bedside commode. 3.  Continue to monitor hemoglobin transfusion as needed less than 7 4.  Patient is now scheduled for EGD and colonoscopy tomorrow afternoon with Dr. Hilarie Fredrickson. 5.  I have ordered another movi prep to be started now and sipped on throughout the day.  Thank you for your kind consultation, we will continue to follow.   LOS: 6 days   Levin Erp  06/27/2021, 10:16 AM

## 2021-06-27 NOTE — Progress Notes (Signed)
PROGRESS NOTE    [name]  HUD:[id number] DOB: July 09, 1951 DOA: 06/12/2021 PCP: Biagio Borg, MD   Chief Complain: Fast heartbeat.  Brief Narrative: Patient is a 70 year old female with history of anxiety, depression, osteoarthritis, chronic back pain, CKD stage IIIa, coronary disease, diabetes type 2, peptic ulcer disease with gastric ulcer, gastroparesis, hyperlipidemia, hypothyroidism, chronic anemia who presented to the emergency department after she was referred by her PCP for the evaluation of fast heartbeat.  On presentation she looked dehydrated and was started on IV fluids.  Hospital course remarkable for drop in the hemoglobin in the range of 6.  Transfused with a unit of PRBC on 06/23/21. FOBT positive,GI consulted , plan for EGD/colonoscopy tomorrow.  PT/OT recommending SN facility on discharge.  Assessment & Plan:   Principal Problem:   Lactic acidosis Active Problems:   HLD (hyperlipidemia)   Anemia   Depression   Coronary atherosclerosis   GERD   Hypothyroidism   CKD (chronic kidney disease), stage III (HCC)   Essential (primary) hypertension   Insulin dependent type 2 diabetes mellitus (H[facility])   Leukocytosis   Generalized weakness   Tachycardia   Heme positive stool   Acute on chronic macrocytic anemia: Her baseline hemoglobin is in the range of 8-10.  Hemoglobin suddenly dropped from 9 to 6.4 during this hospitalization .    She has chronic macrocytosis.  FOBT positive patient is a very poor historian but said she had black stools but as per the report of nursing facility where is recently was , she  did not have melena or bloody stools.  Denies any abdominal pain.  Vitamin C58 and folic acid level checked on 05/18/2021 are normal.  She was transfused with a unit of PRBC on 06/23/21.GI consulted,plan for EGD and colonoscopy tomorrow,delayed  due to incomplete bowel preparation.  Cognitive impairment/confusion: Patient has cognitive impairment at baseline now for a  while.  No documented history of dementia.  Urinalysis did not show any signs of UTI.  MRI cudnt be done because she has a neurostimulator in her back.  Extensive work-up was done about her encephalopathy when she was admitted on July 2022 at [facility].  Neurology was also consulted during that admission and neurology recommend outpatient follow-up.  Depression/anxiety: On Prozac at home  History of coronary artery disease: She was on aspirin.  Will hold for now due to drop in the hemoglobin  Hypertension: Blood pressure has been stable after restarted on home medicines.  Continue to monitor blood pressure.  On metoprolol, amlodipine, Imdur.  Was also on losartan/hydrochlorothiazide at home, currently on hold,we will resume if she remains hypertensive  GERD: Continue Protonix  Hypothyroidism: Continue Synthyroid  CKD stage IIIa: Currently kidney function at baseline  Constipation: Continue aggressive bowel regimen  Diabetes type 2: On insulin at home. Continue current insulin regimen.  Monitor blood sugars.  Debility/deconditioning: Patient seen by PT/OT recommended SNF on DC.  Patient is chronically wheelchair-bound.  Recently she had right femur fracture, status post ORIF.Daughter wants her to be placed in SNF.  TOC following.  Morbid obesity: BMI of 40.6           DVT prophylaxis:SCD Code Status: Full Family Communication: Daughter on phone on 06/25/2021 Status is: Inpatient  Remains inpatient appropriate because:Inpatient level of care appropriate due to severity of illness  Dispo: The patient is from: Home              Anticipated d/c is to: SNF  Patient currently is not medically stable to d/c.   Difficult to place patient No     Consultants: None  Procedures:None  Antimicrobials:  Anti-infectives (From admission, onward)    None       Subjective:  Patient seen and examined at the bedside this morning.  Not in any kind of distress.   Overall comfortable.  She was having some urinary retention issues since yesterday.  She was trying to void when I entered the room.  Denies any complaints.  Remains confused but alert and awake  Objective: Vitals:   06/26/21 0934 06/26/21 1219 06/26/21 2120 06/27/21 0922  BP: 135/74 129/72 (!) 107/58 125/65  Pulse: 84 82 86 79  Resp:  14 18   Temp:  98.5 F (36.9 C) 98 F (36.7 C)   TempSrc:  Oral    SpO2:  100% 100%   Weight:      Height:        Intake/Output Summary (Last 24 hours) at 06/27/2021 1137 Last data filed at 06/26/2021 2230 Gross per 24 hour  Intake 120 ml  Output 200 ml  Net -80 ml   Filed Weights   06/15/2021 1800 06/21/21 1101 06/21/21 1403  Weight: 108.9 kg 108.9 kg 110.8 kg    Examination:  General exam: Overall comfortable, not in distress, obese, pleasantly confused HEENT: PERRL Respiratory system:  no wheezes or crackles  Cardiovascular system: S1 & S2 heard, RRR.  Gastrointestinal system: Abdomen is nondistended, soft and nontender. Central nervous system: Alert and awake but not oriented Extremities: No edema, no clubbing ,no cyanosis Skin: No rashes, no ulcers,no icterus    Data Reviewed: I have personally reviewed following labs and imaging studies  CBC: Recent Labs  Lab 07/04/2021 1930 06/21/21 0458 06/23/21 0823 06/24/21 1004 06/25/21 0416 06/26/21 0813 06/27/21 0718  WBC 12.9*   < > 10.8* 12.5* 10.2 11.5* 11.6*  NEUTROABS 10.8*  --   --  9.1* 5.9 7.5 7.5  HGB 9.0*   < > 6.4* 8.0* 7.2* 8.2* 8.8*  HCT 29.1*   < > 20.1* 23.9* 21.6* 25.7* 27.4*  MCV 107.8*   < > 105.8* 97.2 97.7 101.2* 100.7*  PLT 372   < > 290 312 281 310 345   < > = values in this interval not displayed.   Basic Metabolic Panel: Recent Labs  Lab 06/07/2021 1930 06/21/21 0458 06/23/21 0823 06/24/21 1004 06/27/21 0718  NA 137 137 138 142 139  K 3.8 3.5 3.5 3.6 4.1  CL 106 108 111 115* 108  CO2 17* 19* 20* 19* 21*  GLUCOSE 240* 206* 128* 201* 108*  BUN _0 CREATININE 1.21* 1.04* 1.16* 1.07* 0.97  CALCIUM 9.8 9.4 8.9 9.4 9.3  MG  --  1.6* 1.9  --   --   PHOS  --  2.8  --   --   --    GFR: Estimated Creatinine Clearance: 65.9 mL/min (by C-G formula based on SCr of 0.97 mg/dL). Liver Function Tests: Recent Labs  Lab 06/21/21 0458  AST 20  ALT 15  ALKPHOS 54  BILITOT 0.8  PROT 6.4*  ALBUMIN 3.1*   No results for input(s): LIPASE, AMYLASE in the last 168 hours.  No results for input(s): AMMONIA in the last 168 hours. Coagulation Profile: No results for input(s): INR, PROTIME in the last 168 hours.  Cardiac Enzymes: No results for input(s): CKTOTAL, CKMB, CKMBINDEX, TROPONINI in the last 168 hours.  BNP (last  3 results) No results for input(s): PROBNP in the last 8760 hours. HbA1C: No results for input(s): HGBA1C in the last 72 hours. CBG: Recent Labs  Lab 06/26/21 0722 06/26/21 1101 06/26/21 1554 06/26/21 2122 06/27/21 0824  GLUCAP 133* 109* 150* 101* 112*   Lipid Profile: No results for input(s): CHOL, HDL, LDLCALC, TRIG, CHOLHDL, LDLDIRECT in the last 72 hours. Thyroid Function Tests: No results for input(s): TSH, T4TOTAL, FREET4, T3FREE, THYROIDAB in the last 72 hours. Anemia Panel: No results for input(s): VITAMINB12, FOLATE, FERRITIN, TIBC, IRON, RETICCTPCT in the last 72 hours.  Sepsis Labs: Recent Labs  Lab 06/22/2021 1930 06/23/2021 2216 06/21/21 0458  LATICACIDVEN 2.7* 2.3* 1.9    Recent Results (from the past 240 hour(s))  Culture, blood (Routine x 2)     Status: None   Collection Time: 06/12/2021 12:54 AM   Specimen: BLOOD RIGHT HAND  Result Value Ref Range Status   Specimen Description   Final    BLOOD RIGHT HAND Performed at [facility] [address] [place], [place] [address]    Special Requests   Final    BOTTLES DRAWN AEROBIC AND ANAEROBIC Blood Culture results may not be optimal due to an inadequate volume of blood received in culture bottles Performed at [facility] [address] [place], [place] [address]    Culture   Final    NO GROWTH 5 DAYS Performed at [facility], [place] [address], [place], [place] [address]    Report Status 06/25/2021 FINAL  Final  Culture, blood (Routine x 2)     Status: None   Collection Time: 06/11/2021  9:00 PM   Specimen: BLOOD  Result Value Ref Range Status   Specimen Description   Final    BLOOD BLOOD LEFT HAND Performed at [facility] [address], [place], [place] [address]    Special Requests   Final    BOTTLES DRAWN AEROBIC AND ANAEROBIC Blood Culture adequate volume Performed at [facility] [address], [place], [place] [address]    Culture   Final    NO GROWTH 5 DAYS Performed at [facility], [place] [address] [place], [place] [address]    Report Status 06/25/2021 FINAL  Final  Culture, blood (routine x 2)     Status: None   Collection Time: 06/24/2021  9:00 PM   Specimen: BLOOD  Result Value Ref Range Status   Specimen Description   Final    BLOOD BLOOD RIGHT HAND Performed at [facility] [address] [place], [place] [address]    Special Requests   Final    BOTTLES DRAWN AEROBIC AND ANAEROBIC Blood Culture adequate volume Performed at [facility] [address] [place], [place] [address]    Culture   Final    NO GROWTH 5 DAYS Performed at [facility], [place] [address], [place], [place] [address]    Report Status 06/25/2021 FINAL  Final         Radiology Studies: No results found.      Scheduled Meds:  amLODipine  10 mg Oral Daily   atorvastatin  40 mg Oral QHS   FLUoxetine  20 mg Oral Daily   insulin aspart  0-20 Units Subcutaneous TID WC   insulin glargine-yfgn  10 Units Subcutaneous Daily   isosorbide mononitrate  60 mg Oral Daily   levothyroxine  50 mcg Oral Q0600  linaclotide  72 mcg Oral QAC breakfast   LORazepam  0.5 mg Intravenous Once    megestrol  400 mg Oral Daily   metoprolol succinate  50 mg Oral Daily   pantoprazole (PROTONIX) IV  40 mg Intravenous Q12H   peg 3350 powder  0.5 kit Oral Once   peg 3350 powder  0.5 kit Oral Once   And   peg 3350 powder  0.5 kit Oral Once   polyethylene glycol  17 g Oral Daily   polyethylene glycol powder  0.5 Container Oral Once   Continuous Infusions:     LOS: 6 days    Time spent: 25 mins.More than 50% of that time was spent in counseling and/or coordination of care.      Shelly Coss, MD [facility] P9/21/2022, 11:37 AM

## 2021-06-27 NOTE — Progress Notes (Signed)
Patient refused bladder scan @ 0500 & 0600, will pass it along to oncoming nurse to try again

## 2021-06-28 ENCOUNTER — Inpatient Hospital Stay (HOSPITAL_COMMUNITY): Payer: Medicare Other | Admitting: Anesthesiology

## 2021-06-28 ENCOUNTER — Encounter (HOSPITAL_COMMUNITY): Payer: Self-pay | Admitting: Family Medicine

## 2021-06-28 ENCOUNTER — Encounter (HOSPITAL_COMMUNITY)
Admission: EM | Disposition: A | Discharge status: Skilled Nursing Facility | Payer: Self-pay | Source: Home / Self Care | Attending: Internal Medicine

## 2021-06-28 DIAGNOSIS — K2970 Gastritis, unspecified, without bleeding: Secondary | ICD-10-CM

## 2021-06-28 DIAGNOSIS — K222 Esophageal obstruction: Secondary | ICD-10-CM

## 2021-06-28 DIAGNOSIS — K2900 Acute gastritis without bleeding: Secondary | ICD-10-CM

## 2021-06-28 DIAGNOSIS — E872 Acidosis: Secondary | ICD-10-CM | POA: Diagnosis not present

## 2021-06-28 HISTORY — PX: BIOPSY: SHX5522

## 2021-06-28 HISTORY — PX: ESOPHAGOGASTRODUODENOSCOPY (EGD) WITH PROPOFOL: SHX5813

## 2021-06-28 LAB — CBC WITH DIFFERENTIAL/PLATELET
Abs Immature Granulocytes: 0.11 10*3/uL — ABNORMAL HIGH (ref 0.00–0.07)
Basophils Absolute: 0 10*3/uL (ref 0.0–0.1)
Basophils Relative: 0 %
Eosinophils Absolute: 0.2 10*3/uL (ref 0.0–0.5)
Eosinophils Relative: 2 %
HCT: 25.7 % — ABNORMAL LOW (ref 36.0–46.0)
Hemoglobin: 8.2 g/dL — ABNORMAL LOW (ref 12.0–15.0)
Immature Granulocytes: 1 %
Lymphocytes Relative: 27 %
Lymphs Abs: 2.7 10*3/uL (ref 0.7–4.0)
MCH: 32.7 pg (ref 26.0–34.0)
MCHC: 31.9 g/dL (ref 30.0–36.0)
MCV: 102.4 fL — ABNORMAL HIGH (ref 80.0–100.0)
Monocytes Absolute: 0.6 10*3/uL (ref 0.1–1.0)
Monocytes Relative: 6 %
Neutro Abs: 6.4 10*3/uL (ref 1.7–7.7)
Neutrophils Relative %: 64 %
Platelets: 323 10*3/uL (ref 150–400)
RBC: 2.51 MIL/uL — ABNORMAL LOW (ref 3.87–5.11)
RDW: 18.2 % — ABNORMAL HIGH (ref 11.5–15.5)
WBC: 10.1 10*3/uL (ref 4.0–10.5)
nRBC: 0.4 % — ABNORMAL HIGH (ref 0.0–0.2)

## 2021-06-28 LAB — GLUCOSE, CAPILLARY
Glucose-Capillary: 118 mg/dL — ABNORMAL HIGH (ref 70–99)
Glucose-Capillary: 130 mg/dL — ABNORMAL HIGH (ref 70–99)
Glucose-Capillary: 78 mg/dL (ref 70–99)
Glucose-Capillary: 92 mg/dL (ref 70–99)

## 2021-06-28 SURGERY — ESOPHAGOGASTRODUODENOSCOPY (EGD) WITH PROPOFOL
Anesthesia: Monitor Anesthesia Care

## 2021-06-28 MED ORDER — LIDOCAINE 2% (20 MG/ML) 5 ML SYRINGE
INTRAMUSCULAR | Status: DC | PRN
  Administered 2021-06-28: 100 mg via INTRAVENOUS

## 2021-06-28 MED ORDER — PROPOFOL 10 MG/ML IV BOLUS
INTRAVENOUS | Status: DC | PRN
  Administered 2021-06-28 (×2): 20 mg via INTRAVENOUS

## 2021-06-28 MED ORDER — FLUCONAZOLE 100 MG PO TABS
100.0000 mg | ORAL_TABLET | Freq: Every day | ORAL | Status: DC
  Administered 2021-06-29 – 2021-07-02 (×4): 100 mg via ORAL
  Filled 2021-06-28 (×4): qty 1

## 2021-06-28 MED ORDER — PANTOPRAZOLE SODIUM 40 MG PO TBEC
40.0000 mg | DELAYED_RELEASE_TABLET | Freq: Every day | ORAL | Status: DC
  Administered 2021-06-29 – 2021-07-02 (×3): 40 mg via ORAL
  Filled 2021-06-28 (×3): qty 1

## 2021-06-28 MED ORDER — PROPOFOL 500 MG/50ML IV EMUL
INTRAVENOUS | Status: DC | PRN
  Administered 2021-06-28: 15 ug/kg/min via INTRAVENOUS

## 2021-06-28 MED ORDER — PHENYLEPHRINE 40 MCG/ML (10ML) SYRINGE FOR IV PUSH (FOR BLOOD PRESSURE SUPPORT)
PREFILLED_SYRINGE | INTRAVENOUS | Status: DC | PRN
  Administered 2021-06-28 (×3): 80 ug via INTRAVENOUS

## 2021-06-28 MED ORDER — LACTATED RINGERS IV SOLN
INTRAVENOUS | Status: DC
  Administered 2021-06-28: 1000 mL via INTRAVENOUS

## 2021-06-28 MED ORDER — FLUCONAZOLE 200 MG PO TABS
200.0000 mg | ORAL_TABLET | Freq: Once | ORAL | Status: AC
  Administered 2021-06-28: 200 mg via ORAL
  Filled 2021-06-28: qty 1
  Filled 2021-06-28: qty 2

## 2021-06-28 MED ORDER — PROPOFOL 10 MG/ML IV BOLUS
INTRAVENOUS | Status: AC
  Filled 2021-06-28: qty 20

## 2021-06-28 SURGICAL SUPPLY — 24 items

## 2021-06-28 NOTE — Progress Notes (Signed)
Patient has not urinated all night. Patient keeps saying she is holding her urine and needs her husband to get her up and walk her to the bathroom. Patient reoriented but insists on bringing her husband.

## 2021-06-28 NOTE — Op Note (Signed)
[facility] Patient Name: [name] Procedure Date: 06/28/2021 MRN: [id number] Attending MD: Jerene Bears , MD Date of Birth: 03-04-1951 CSN: [id number] Age: 70 Admit Type: Inpatient Procedure:                Upper GI endoscopy Indications:              Acute on chronic anemia, Heme positive stool,                            colonoscopy also recommended but patient could not                            tolerate bowel prep Providers:                Lajuan Lines. Hilarie Fredrickson, MD, Dulcy Fanny, Laverda Sorenson,                            Technician, Danley Danker, CRNA Referring MD:             [facility] Medicines:                Monitored Anesthesia Care Complications:            No immediate complications. Estimated Blood Loss:     Estimated blood loss was minimal. Procedure:                Pre-Anesthesia Assessment:                           - Prior to the procedure, a History and Physical                            was performed, and patient medications and                            allergies were reviewed. The patient's tolerance of                            previous anesthesia was also reviewed. The risks                            and benefits of the procedure and the sedation                            options and risks were discussed with the patient.                            All questions were answered, and informed consent                            was obtained. Prior Anticoagulants: The patient has                            taken no previous anticoagulant or antiplatelet  agents. ASA Grade Assessment: III - A patient with                            severe systemic disease. After reviewing the risks                            and benefits, the patient was deemed in                            satisfactory condition to undergo the procedure.                           - Prior to the procedure, a History and Physical                             was performed, and patient medications and                            allergies were reviewed. The patient's tolerance of                            previous anesthesia was also reviewed. The risks                            and benefits of the procedure and the sedation                            options and risks were discussed with the patient.                            All questions were answered, and informed consent                            was obtained. Prior Anticoagulants: The patient has                            taken no previous anticoagulant or antiplatelet                            agents. ASA Grade Assessment: III - A patient with                            severe systemic disease. After reviewing the risks                            and benefits, the patient was deemed in                            satisfactory condition to undergo the procedure.                           After obtaining informed consent, the endoscope was  passed under direct vision. Throughout the                            procedure, the patient's blood pressure, pulse, and                            oxygen saturations were monitored continuously. The                            [id number]) Olympus endoscope was introduced                            through the mouth, and advanced to the second part                            of duodenum. The upper GI endoscopy was                            accomplished without difficulty. The patient                            tolerated the procedure well. Scope In: Scope Out: Findings:      Diffuse plaques were found in the upper third of the esophagus and in       the middle third of the esophagus.      A non-obstructing Schatzki ring was found at the gastroesophageal       junction.      A 2 cm hiatal hernia was present.      A medium amount of food (residue) was found in the gastric body.      Evidence of a  previous surgical intervention in the distal gastric       body/proximal antrum (pylorus is preserved).      Moderate inflammation characterized by congestion (edema), erosions and       erythema was found in the gastric body and in the gastric antrum.       Biopsies were taken with a cold forceps for histology and Helicobacter       pylori testing.      The examined duodenum was normal. Impression:               - Esophageal plaques were found, consistent with                            candidiasis.                           - Non-obstructing Schatzki ring.                           - 2 cm hiatal hernia.                           - A medium amount of food (residue) in the stomach                            consistent with gastroparesis.                           -  Previous, pylorus-preserving, surgical                            intervention in the distal gastric body/proximal                            antrum.                           - Gastritis with erosions. Biopsied.                           - Normal examined duodenum. Moderate Sedation:      N/A Recommendation:           - Return patient to hospital ward for ongoing care.                           - Advance diet as tolerated.                           - Continue present medications.                           - Fluconazole 200 mg x 1 day, and 100 mg x 13 days                            is recommended for esophageal Candidiasis.                           - Await pathology results.                           - Daily PPI is recommended.                           - Monitor Hgb.                           - Patient unable to tolerated attempted colonoscopy                            preparation. CT scan of the colon x 3 over the past                            2 months did not shows overt colonic pathology.                            Constipation has been an issue and thus MiraLax 17                            g daily is  recommended.                           - GI will sign off, call if questions. Procedure Code(s):        --- Professional ---  [id number], Esophagogastroduodenoscopy, flexible,                            transoral; with biopsy, single or multiple Diagnosis Code(s):        --- Professional ---                           K22.9, Disease of esophagus, unspecified                           K22.2, Esophageal obstruction                           K44.9, Diaphragmatic hernia without obstruction or                            gangrene                           Z98.0, Intestinal bypass and anastomosis status                           K29.70, Gastritis, unspecified, without bleeding                           D62, Acute posthemorrhagic anemia                           R19.5, Other fecal abnormalities CPT copyright 2019 American [employer] Association. All rights reserved. The codes documented in this report are preliminary and upon coder review may  be revised to meet current compliance requirements. Jerene Bears, MD 06/28/2021 3:03:33 PM This report has been signed electronically. Number of Addenda: 0

## 2021-06-28 NOTE — Progress Notes (Signed)
Patient scheduled for EGD / colonoscopy today to evaluate heme positive anemia. Endoscopy called me, patient didn't complete much of her prep and having brown stool I came up to see the patient. She is confused, tells me she consumed the whole prep. I spoke with RN and patient drank a small portion of last night's prep and none of this morning's dose.  I called daughter [name] who we spoke with yesterday about endoscopic evaluation. At this point our only option would be to insert an NGT to administer the prep but patient would most likely need to be restrained for the whole process. Will proceed with EGD today.

## 2021-06-28 NOTE — Transfer of Care (Signed)
Immediate Anesthesia Transfer of Care Note  Patient: [name]  Procedure(s) Performed: ESOPHAGOGASTRODUODENOSCOPY (EGD) WITH PROPOFOL BIOPSY  Patient Location: Endoscopy Unit  Anesthesia Type:MAC  Level of Consciousness: drowsy  Airway & Oxygen Therapy: Patient Spontanous Breathing and Patient connected to face mask oxygen  Post-op Assessment: Report given to RN and Post -op Vital signs reviewed and stable  Post vital signs: Reviewed and stable  Last Vitals:  Vitals Value Taken Time  BP 96/63 06/28/21 1503  Temp    Pulse 95 06/28/21 1503  Resp 16 06/28/21 1503  SpO2 100 % 06/28/21 1503    Last Pain:  Vitals:   06/28/21 1320  TempSrc: Oral  PainSc: 0-No pain      Patients Stated Pain Goal: 4 (35/46/56 8127)  Complications: No notable events documented.

## 2021-06-28 NOTE — Progress Notes (Signed)
PROGRESS NOTE    [name]  WEX:[id number] DOB: 29-Jul-1951 DOA: 06/17/2021 PCP: Biagio Borg, MD   Chief Complain: Fast heartbeat.  Brief Narrative: Patient is a 70 year old female with history of anxiety, depression, osteoarthritis, chronic back pain, CKD stage IIIa, coronary disease, diabetes type 2, peptic ulcer disease with gastric ulcer, gastroparesis, hyperlipidemia, hypothyroidism, chronic anemia who presented to the emergency department after she was referred by her PCP for the evaluation of fast heartbeat.  On presentation she looked dehydrated and was started on IV fluids.  Hospital course remarkable for drop in the hemoglobin in the range of 6.  Transfused with a unit of PRBC on 06/23/21. FOBT positive,GI consulted , plan for EGD/colonoscopy today.  PT/OT recommending SN facility on discharge.  Assessment & Plan:   Principal Problem:   Lactic acidosis Active Problems:   HLD (hyperlipidemia)   Anemia   Depression   Coronary atherosclerosis   GERD   Hypothyroidism   CKD (chronic kidney disease), stage III (HCC)   Essential (primary) hypertension   Insulin dependent type 2 diabetes mellitus (HCC)   Leukocytosis   Generalized weakness   Tachycardia   Heme positive stool   Acute on chronic macrocytic anemia: Her baseline hemoglobin is in the range of 8-10.  Hemoglobin suddenly dropped from 9 to 6.4 during this hospitalization .    She has chronic macrocytosis.  FOBT positive patient is a very poor historian but said she had black stools but as per the report of nursing facility where is recently was , she  did not have melena or bloody stools.  Denies any abdominal pain.  Vitamin L38 and folic acid level checked on 05/18/2021 are normal.  She was transfused with a unit of PRBC on 06/23/21.GI consulted,plan for EGD and colonoscopy today.  Cognitive impairment/confusion: Patient has cognitive impairment at baseline now for a while.  No documented history of dementia.   Urinalysis did not show any signs of UTI.  MRI cudnt be done because she has a neurostimulator in her back.  Extensive work-up was done about her encephalopathy when she was admitted on July 2022 at [facility].  Neurology was also consulted during that admission and neurology recommend outpatient follow-up.  Depression/anxiety: On Prozac at home  History of coronary artery disease: She was on aspirin.  Will hold for now due to drop in the hemoglobin  Hypertension: Blood pressure has been stable after restarted on home medicines.  Continue to monitor blood pressure.  On metoprolol, amlodipine, Imdur.  Was also on losartan/hydrochlorothiazide at home, currently on hold,we will resume if she remains hypertensive  GERD: Continue Protonix  Hypothyroidism: Continue Synthyroid  CKD stage IIIa: Currently kidney function at baseline  Constipation: Continue aggressive bowel regimen  Diabetes type 2: On insulin at home. Continue current insulin regimen.  Monitor blood sugars.  Debility/deconditioning: Patient seen by PT/OT recommended SNF on DC.  Patient is chronically wheelchair-bound.  Recently she had right femur fracture, status post ORIF.Daughter wants her to be placed in SNF.  TOC following.  Morbid obesity: BMI of 40.6           DVT prophylaxis:SCD Code Status: Full Family Communication: Daughter on phone on 06/28/2021 Status is: Inpatient  Remains inpatient appropriate because:Inpatient level of care appropriate due to severity of illness  Dispo: The patient is from: Home              Anticipated d/c is to: SNF  Patient currently is not medically stable to d/c.   Difficult to place patient No     Consultants: None  Procedures:None  Antimicrobials:  Anti-infectives (From admission, onward)    None       Subjective: Patient seen and examined at the bedside this morning.  Hemodynamically stable.  Overall looks comfortable.  Denies any new complaints.   Remains confused  Objective: Vitals:   06/27/21 1236 06/27/21 2045 06/28/21 0517 06/28/21 1120  BP: 123/61 (!) 123/56 (!) 113/52 (!) 143/69  Pulse: 88 86 82 83  Resp: _0 Temp: 98.5 F (36.9 C) 98.6 F (37 C) 98.6 F (37 C)   TempSrc: Oral Oral Oral   SpO2: 100% 100% 100%   Weight:      Height:       No intake or output data in the 24 hours ending 06/28/21 1133  Filed Weights   06/11/2021 1800 06/21/21 1101 06/21/21 1403  Weight: 108.9 kg 108.9 kg 110.8 kg    Examination:  General exam: Overall comfortable, not in distress,obese HEENT: PERRL Respiratory system:  no wheezes or crackles  Cardiovascular system: S1 & S2 heard, RRR.  Gastrointestinal system: Abdomen is nondistended, soft and nontender. Central nervous system: Alert and awake but not oriented Extremities: No edema, no clubbing ,no cyanosis Skin: No rashes, no ulcers,no icterus    Data Reviewed: I have personally reviewed following labs and imaging studies  CBC: Recent Labs  Lab 06/24/21 1004 06/25/21 0416 06/26/21 0813 06/27/21 0718 06/28/21 0448  WBC 12.5* 10.2 11.5* 11.6* 10.1  NEUTROABS 9.1* 5.9 7.5 7.5 6.4  HGB 8.0* 7.2* 8.2* 8.8* 8.2*  HCT 23.9* 21.6* 25.7* 27.4* 25.7*  MCV 97.2 97.7 101.2* 100.7* 102.4*  PLT 312 281 310 345 116   Basic Metabolic Panel: Recent Labs  Lab 06/23/21 0823 06/24/21 1004 06/27/21 0718  NA 138 142 139  K 3.5 3.6 4.1  CL 111 115* 108  CO2 20* 19* 21*  GLUCOSE 128* 201* 108*  BUN _1 CREATININE 1.16* 1.07* 0.97  CALCIUM 8.9 9.4 9.3  MG 1.9  --   --    GFR: Estimated Creatinine Clearance: 65.9 mL/min (by C-G formula based on SCr of 0.97 mg/dL). Liver Function Tests: No results for input(s): AST, ALT, ALKPHOS, BILITOT, PROT, ALBUMIN in the last 168 hours.  No results for input(s): LIPASE, AMYLASE in the last 168 hours.  No results for input(s): AMMONIA in the last 168 hours. Coagulation Profile: No results for input(s): INR, PROTIME in the  last 168 hours.  Cardiac Enzymes: No results for input(s): CKTOTAL, CKMB, CKMBINDEX, TROPONINI in the last 168 hours.  BNP (last 3 results) No results for input(s): PROBNP in the last 8760 hours. HbA1C: No results for input(s): HGBA1C in the last 72 hours. CBG: Recent Labs  Lab 06/26/21 2122 06/27/21 0824 06/27/21 1145 06/27/21 1640 06/28/21 0744  GLUCAP 101* 112* 115* 126* 118*   Lipid Profile: No results for input(s): CHOL, HDL, LDLCALC, TRIG, CHOLHDL, LDLDIRECT in the last 72 hours. Thyroid Function Tests: No results for input(s): TSH, T4TOTAL, FREET4, T3FREE, THYROIDAB in the last 72 hours. Anemia Panel: No results for input(s): VITAMINB12, FOLATE, FERRITIN, TIBC, IRON, RETICCTPCT in the last 72 hours.  Sepsis Labs: No results for input(s): PROCALCITON, LATICACIDVEN in the last 168 hours.   Recent Results (from the past 240 hour(s))  Culture, blood (Routine x 2)     Status: None   Collection Time: 06/07/2021 12:54 AM  Specimen: BLOOD RIGHT HAND  Result Value Ref Range Status   Specimen Description   Final    BLOOD RIGHT HAND Performed at [facility] [address], [place], [place] [address]    Special Requests   Final    BOTTLES DRAWN AEROBIC AND ANAEROBIC Blood Culture results may not be optimal due to an inadequate volume of blood received in culture bottles Performed at [facility] [address], [place], [place] [address]    Culture   Final    NO GROWTH 5 DAYS Performed at [facility], [place] [address] [place], [place] [address]    Report Status 06/25/2021 FINAL  Final  Culture, blood (Routine x 2)     Status: None   Collection Time: 06/27/2021  9:00 PM   Specimen: BLOOD  Result Value Ref Range Status   Specimen Description   Final    BLOOD BLOOD LEFT HAND Performed at [facility] [address] [place], [place] [address]    Special Requests   Final    BOTTLES DRAWN AEROBIC AND  ANAEROBIC Blood Culture adequate volume Performed at [facility] [address] [place], [place] [address]    Culture   Final    NO GROWTH 5 DAYS Performed at [facility], [place] [address] [place], [place] [address]    Report Status 06/25/2021 FINAL  Final  Culture, blood (routine x 2)     Status: None   Collection Time: 06/10/2021  9:00 PM   Specimen: BLOOD  Result Value Ref Range Status   Specimen Description   Final    BLOOD BLOOD RIGHT HAND Performed at [facility] [address] [place], [place] [address]    Special Requests   Final    BOTTLES DRAWN AEROBIC AND ANAEROBIC Blood Culture adequate volume Performed at [facility] [address] [place], [place] [address]    Culture   Final    NO GROWTH 5 DAYS Performed at [facility], [place] [address] [place], [place] [address]    Report Status 06/25/2021 FINAL  Final         Radiology Studies: No results found.      Scheduled Meds:  amLODipine  10 mg Oral Daily   atorvastatin  40 mg Oral QHS   FLUoxetine  20 mg Oral Daily   insulin aspart  0-20 Units Subcutaneous TID WC   insulin glargine-yfgn  10 Units Subcutaneous Daily   isosorbide mononitrate  60 mg Oral Daily   levothyroxine  50 mcg Oral Q0600   linaclotide  72 mcg Oral QAC breakfast   LORazepam  0.5 mg Intravenous Once   megestrol  400 mg Oral Daily   metoprolol succinate  50 mg Oral Daily   pantoprazole (PROTONIX) IV  40 mg Intravenous Q12H   peg 3350 powder  0.5 kit Oral Once   peg 3350 powder  0.5 kit Oral Once   polyethylene glycol  17 g Oral Daily   polyethylene glycol powder  0.5 Container Oral Once   Continuous Infusions:     LOS: 7 days    Time spent: 25 mins.More than 50% of that time was spent in counseling and/or coordination of care.      Shelly Coss, MD [facility]9/22/2022, 11:33 AM

## 2021-06-28 NOTE — Procedures (Addendum)
EGD findings and recommendations discussed with the patient's daughter by phone after the procedure.  Time provided for questions and answers and she thanked me for the call

## 2021-06-28 NOTE — Plan of Care (Signed)
  Problem: Clinical Measurements: Goal: Ability to maintain clinical measurements within normal limits will improve Outcome: Progressing Goal: Will remain free from infection Outcome: Progressing Goal: Diagnostic test results will improve Outcome: Progressing   Problem: Pain Managment: Goal: General experience of comfort will improve Outcome: Progressing   Problem: Safety: Goal: Ability to remain free from injury will improve Outcome: Progressing   Problem: Skin Integrity: Goal: Risk for impaired skin integrity will decrease Outcome: Progressing   Problem: Education: Goal: Knowledge of General Education information will improve Description: Including pain rating scale, medication(s)/side effects and non-pharmacologic comfort measures Outcome: Not Progressing   Problem: Health Behavior/Discharge Planning: Goal: Ability to manage health-related needs will improve Outcome: Not Progressing   Problem: Clinical Measurements: Goal: Respiratory complications will improve Outcome: Not Progressing Goal: Cardiovascular complication will be avoided Outcome: Not Progressing   Problem: Activity: Goal: Risk for activity intolerance will decrease Outcome: Not Progressing   Problem: Nutrition: Goal: Adequate nutrition will be maintained Outcome: Not Progressing   Problem: Coping: Goal: Level of anxiety will decrease Outcome: Not Progressing   Problem: Elimination: Goal: Will not experience complications related to bowel motility Outcome: Not Progressing Goal: Will not experience complications related to urinary retention Outcome: Not Progressing

## 2021-06-28 NOTE — Anesthesia Preprocedure Evaluation (Addendum)
Anesthesia Evaluation  Patient identified by MRN, date of birth, ID band Patient awake    Reviewed: Allergy & Precautions, NPO status , Patient's Chart, lab work & pertinent test results, reviewed documented beta blocker date and time   Airway Mallampati: III  TM Distance: >3 FB Neck ROM: Full    Dental  (+) Edentulous Upper, Dental Advisory Given, Missing   Pulmonary former smoker,    Pulmonary exam normal breath sounds clear to auscultation       Cardiovascular hypertension, Pt. on medications and Pt. on home beta blockers + CAD and +CHF (grade 1 diastolic dysfunction, normal LVEF)  Normal cardiovascular exam Rhythm:Regular Rate:Normal  Echo 04/2021: 1. Left ventricular ejection fraction, by estimation, is 65 to 70%. The  left ventricle has hyperdynamic function. The left ventricle has no  regional wall motion abnormalities. There is moderate concentric left  ventricular hypertrophy. Left ventricular  diastolic parameters are consistent with Grade I diastolic dysfunction  (impaired relaxation).  2. Right ventricular systolic function is normal. The right ventricular  size is normal. Tricuspid regurgitation signal is inadequate for assessing  PA pressure.  3. The aortic valve is tricuspid. There is mild calcification of the  aortic valve. There is mild thickening of the aortic valve. Mild aortic  valve sclerosis is present, with no evidence of aortic valve stenosis.  4. The inferior vena cava is normal in size with greater than 50%  respiratory variability, suggesting right atrial pressure of 3 mmHg.    Neuro/Psych  Headaches, PSYCHIATRIC DISORDERS Anxiety Depression    GI/Hepatic Neg liver ROS, hiatal hernia, PUD, GERD  Medicated and Controlled,Gastroparesis    Endo/Other  diabetes, Well Controlled, Type 2, Insulin DependentHypothyroidism Morbid obesityBMI 41 Last a1c 7.0  Renal/GU Renal disease  negative  genitourinary   Musculoskeletal  (+) Arthritis , Osteoarthritis,  Chronic LBP   Abdominal (+) + obese,   Peds  Hematology  (+) Blood dyscrasia, anemia , 8.2/25.7   Anesthesia Other Findings Chronic pain s/p spinal cord stim  Reproductive/Obstetrics negative OB ROS                            Anesthesia Physical Anesthesia Plan  ASA: 3  Anesthesia Plan: MAC   Post-op Pain Management:    Induction:   PONV Risk Score and Plan: 2 and Propofol infusion and TIVA  Airway Management Planned: Natural Airway and Simple Face Mask  Additional Equipment: None  Intra-op Plan:   Post-operative Plan:   Informed Consent: I have reviewed the patients History and Physical, chart, labs and discussed the procedure including the risks, benefits and alternatives for the proposed anesthesia with the patient or authorized representative who has indicated his/her understanding and acceptance.     Dental advisory given  Plan Discussed with: CRNA  Anesthesia Plan Comments:        Anesthesia Quick Evaluation

## 2021-06-28 NOTE — Care Management Important Message (Signed)
Important Message  Patient Details IM Letter given to the Patient. Name: [name] MRN: [id number] Date of Birth: October 27, 1950   Medicare Important Message Given:  Yes     Kerin Salen 06/28/2021, 1:19 PM

## 2021-06-28 NOTE — Anesthesia Procedure Notes (Signed)
Date/Time: 06/28/2021 2:40 PM Performed by: Sharlette Dense, CRNA Oxygen Delivery Method: Simple face mask

## 2021-06-29 ENCOUNTER — Encounter (HOSPITAL_COMMUNITY): Payer: Self-pay | Admitting: Internal Medicine

## 2021-06-29 ENCOUNTER — Ambulatory Visit: Payer: Medicare Other | Admitting: Internal Medicine

## 2021-06-29 ENCOUNTER — Encounter: Payer: Self-pay | Admitting: Internal Medicine

## 2021-06-29 DIAGNOSIS — E872 Acidosis: Secondary | ICD-10-CM | POA: Diagnosis not present

## 2021-06-29 LAB — GLUCOSE, CAPILLARY
Glucose-Capillary: 109 mg/dL — ABNORMAL HIGH (ref 70–99)
Glucose-Capillary: 139 mg/dL — ABNORMAL HIGH (ref 70–99)
Glucose-Capillary: 135 mg/dL — ABNORMAL HIGH (ref 70–99)

## 2021-06-29 LAB — SURGICAL PATHOLOGY

## 2021-06-29 MED ORDER — LORAZEPAM 2 MG/ML IJ SOLN
0.5000 mg | Freq: Once | INTRAMUSCULAR | Status: AC
  Administered 2021-06-29: 0.5 mg via INTRAVENOUS
  Filled 2021-06-29: qty 1

## 2021-06-29 NOTE — Progress Notes (Signed)
Lab attempted to get blood work from patient with assistance from the nurse, but patient was agitated, as well as yelling. Patient refused to let lab draw blood.

## 2021-06-29 NOTE — Progress Notes (Signed)
Patient confused, agitated , combative . Vitals stable and documented; contacted [facility] on-call for low dose ativan.

## 2021-06-29 NOTE — Progress Notes (Signed)
Patient had been agitated, yelling, and combative while staff was trying to clean patient. Notified on call provider about getting an order to help with the patient's agitation. On call provider gave a one time order for 0.5 mg IV Ativan. Patient calmed down with the Ativan, as well as when the patient was left alone.

## 2021-06-29 NOTE — Anesthesia Postprocedure Evaluation (Signed)
Anesthesia Post Note  Patient: [name]  Procedure(s) Performed: ESOPHAGOGASTRODUODENOSCOPY (EGD) WITH PROPOFOL BIOPSY     Patient location during evaluation: PACU Anesthesia Type: MAC Level of consciousness: awake and alert Pain management: pain level controlled Vital Signs Assessment: post-procedure vital signs reviewed and stable Respiratory status: spontaneous breathing, nonlabored ventilation and respiratory function stable Cardiovascular status: blood pressure returned to baseline and stable Postop Assessment: no apparent nausea or vomiting Anesthetic complications: no   No notable events documented.  Last Vitals:  Vitals:   06/28/21 1957 06/29/21 0413  BP: (!) 142/66 140/85  Pulse: 78 87  Resp: 18 20  Temp: 36.9 C 36.6 C  SpO2: 100% 100%    Last Pain:  Vitals:   06/29/21 0413  TempSrc: Oral  PainSc:                  Pervis Hocking

## 2021-06-29 NOTE — Progress Notes (Signed)
PROGRESS NOTE    [name]  NLZ:[id number] DOB: [birth year] DOA: 06/19/2021 PCP: Biagio Borg, MD   Chief Complain: Fast heartbeat.  Brief Narrative: Patient is a 70 year old female with history of anxiety, depression, osteoarthritis, chronic back pain, CKD stage IIIa, coronary disease, diabetes type 2, peptic ulcer disease with gastric ulcer, gastroparesis, hyperlipidemia, hypothyroidism, chronic anemia who presented to the emergency department after she was referred by her PCP for the evaluation of fast heartbeat.  On presentation she looked dehydrated and was started on IV fluids.  Hospital course remarkable for drop in the hemoglobin in the range of 6.  Transfused with a unit of PRBC on 06/23/21. FOBT positive,GI consulted , underwent  EGD.  PT/OT recommending SN facility on discharge.  Medically stable for discharge  Assessment & Plan:   Principal Problem:   Lactic acidosis Active Problems:   HLD (hyperlipidemia)   Anemia   Depression   Coronary atherosclerosis   GERD   Hypothyroidism   CKD (chronic kidney disease), stage III (HCC)   Essential (primary) hypertension   Insulin dependent type 2 diabetes mellitus (HCC)   Leukocytosis   Generalized weakness   Tachycardia   Heme positive stool   Acute gastritis   Acute on chronic macrocytic anemia: Her baseline hemoglobin is in the range of 8-10.  Hemoglobin suddenly dropped from 9 to 6.4 during this hospitalization .    She has chronic macrocytosis.  FOBT positive patient is a very poor historian but said she had black stools but as per the report of nursing facility where is recently was , she  did not have melena or bloody stools.  Denied any abdominal pain.  Vitamin T02 and folic acid level checked on 05/18/2021 are normal.  She was transfused with a unit of PRBC on 06/23/21.GI consulted,underwent  EGD with finding of esophageal candidiasis, 2 cm hiatal hernia, gastritis with erosions.  Continue PPI daily, started on  fluconazole for candidiasis.  She could not tolerate colonoscopy.  Cognitive impairment/confusion: Patient has cognitive impairment at baseline now for a while.  No documented history of dementia.  Urinalysis did not show any signs of UTI.  MRI cudnt be done because she has a neurostimulator in her back.  Extensive work-up was done about her encephalopathy when she was admitted on July 2022 at [facility].  Neurology was also consulted during that admission and neurology recommend outpatient follow-up.  Depression/anxiety: On Prozac at home  History of coronary artery disease: She was on aspirin.  Will hold for now due to drop in the hemoglobin  Hypertension: Blood pressure has been stable after restarted on home medicines.  Continue to monitor blood pressure.  On metoprolol, amlodipine, Imdur.  Was also on losartan/hydrochlorothiazide at home, currently on hold,we will resume if she remains hypertensive  GERD: Continue Protonix  Hypothyroidism: Continue Synthyroid  CKD stage IIIa: Currently kidney function at baseline  Constipation: Continue aggressive bowel regimen  Diabetes type 2: On insulin at home. Continue current insulin regimen.  Monitor blood sugars.  Debility/deconditioning: Patient seen by PT/OT recommended SNF on DC.  Patient is chronically wheelchair-bound.  Recently she had right femur fracture, status post ORIF.Daughter wants her to be placed in SNF.  TOC following.  Morbid obesity: BMI of 40.6           DVT prophylaxis:SCD Code Status: Full Family Communication: Daughter on phone on 06/28/2021 Status is: Inpatient  Remains inpatient appropriate because:Inpatient level of care appropriate due to severity of illness  Dispo:  The patient is from: Home              Anticipated d/c is to: SNF              Patient currently is not medically stable to d/c.   Difficult to place patient No     Consultants: None  Procedures:None  Antimicrobials:   Anti-infectives (From admission, onward)    Start     Dose/Rate Route Frequency Ordered Stop   06/29/21 1000  fluconazole (DIFLUCAN) tablet 100 mg        100 mg Oral Daily 06/28/21 1512 07/12/21 0959   06/28/21 1630  fluconazole (DIFLUCAN) tablet 200 mg        200 mg Oral  Once 06/28/21 1512 06/28/21 1655       Subjective: Patient seen and examined the bedside this morning.  Hemodynamically stable.  Overall comfortable.  She was being fed at bedside.  Denies any new complaints  Objective: Vitals:   06/28/21 1530 06/28/21 1556 06/28/21 1957 06/29/21 0413  BP: 127/72 136/72 (!) 142/66 140/85  Pulse: 88 87 78 87  Resp: _0 Temp:  98.7 F (37.1 C) 98.4 F (36.9 C) 97.9 F (36.6 C)  TempSrc:  Oral Oral Oral  SpO2: 97% 100% 100% 100%  Weight:      Height:        Intake/Output Summary (Last 24 hours) at 06/29/2021 0823 Last data filed at 06/29/2021 0654 Gross per 24 hour  Intake 730 ml  Output 600 ml  Net 130 ml    Filed Weights   06/26/2021 1800 06/21/21 1101 06/21/21 1403  Weight: 108.9 kg 108.9 kg 110.8 kg    Examination:  General exam: Overall comfortable, not in distress, very deconditioned, obese HEENT: PERRL Respiratory system:  no wheezes or crackles  Cardiovascular system: S1 & S2 heard, RRR.  Gastrointestinal system: Abdomen is nondistended, soft and nontender. Central nervous system: Alert and awake but not oriented Extremities: No edema, no clubbing ,no cyanosis Skin: No rashes, no ulcers,no icterus     Data Reviewed: I have personally reviewed following labs and imaging studies  CBC: Recent Labs  Lab 06/24/21 1004 06/25/21 0416 06/26/21 0813 06/27/21 0718 06/28/21 0448  WBC 12.5* 10.2 11.5* 11.6* 10.1  NEUTROABS 9.1* 5.9 7.5 7.5 6.4  HGB 8.0* 7.2* 8.2* 8.8* 8.2*  HCT 23.9* 21.6* 25.7* 27.4* 25.7*  MCV 97.2 97.7 101.2* 100.7* 102.4*  PLT 312 281 310 345 622   Basic Metabolic Panel: Recent Labs  Lab 06/23/21 0823 06/24/21 1004  06/27/21 0718  NA 138 142 139  K 3.5 3.6 4.1  CL 111 115* 108  CO2 20* 19* 21*  GLUCOSE 128* 201* 108*  BUN _1 CREATININE 1.16* 1.07* 0.97  CALCIUM 8.9 9.4 9.3  MG 1.9  --   --    GFR: Estimated Creatinine Clearance: 65.9 mL/min (by C-G formula based on SCr of 0.97 mg/dL). Liver Function Tests: No results for input(s): AST, ALT, ALKPHOS, BILITOT, PROT, ALBUMIN in the last 168 hours.  No results for input(s): LIPASE, AMYLASE in the last 168 hours.  No results for input(s): AMMONIA in the last 168 hours. Coagulation Profile: No results for input(s): INR, PROTIME in the last 168 hours.  Cardiac Enzymes: No results for input(s): CKTOTAL, CKMB, CKMBINDEX, TROPONINI in the last 168 hours.  BNP (last 3 results) No results for input(s): PROBNP in the last 8760 hours. HbA1C: No results for input(s): HGBA1C in the  last 72 hours. CBG: Recent Labs  Lab 06/28/21 0744 06/28/21 1150 06/28/21 1617 06/28/21 2241 06/29/21 0728  GLUCAP 118* 130* 78 92 109*   Lipid Profile: No results for input(s): CHOL, HDL, LDLCALC, TRIG, CHOLHDL, LDLDIRECT in the last 72 hours. Thyroid Function Tests: No results for input(s): TSH, T4TOTAL, FREET4, T3FREE, THYROIDAB in the last 72 hours. Anemia Panel: No results for input(s): VITAMINB12, FOLATE, FERRITIN, TIBC, IRON, RETICCTPCT in the last 72 hours.  Sepsis Labs: No results for input(s): PROCALCITON, LATICACIDVEN in the last 168 hours.   Recent Results (from the past 240 hour(s))  Culture, blood (Routine x 2)     Status: None   Collection Time: 06/07/2021 12:54 AM   Specimen: BLOOD RIGHT HAND  Result Value Ref Range Status   Specimen Description   Final    BLOOD RIGHT HAND Performed at [facility] [address], [place], [place] [address]    Special Requests   Final    BOTTLES DRAWN AEROBIC AND ANAEROBIC Blood Culture results may not be optimal due to an inadequate volume of blood received in culture  bottles Performed at [facility] [address], [place], [place] [address]    Culture   Final    NO GROWTH 5 DAYS Performed at [facility], [place] [address] [place], [place] [address]    Report Status 06/25/2021 FINAL  Final  Culture, blood (Routine x 2)     Status: None   Collection Time: 06/13/2021  9:00 PM   Specimen: BLOOD  Result Value Ref Range Status   Specimen Description   Final    BLOOD BLOOD LEFT HAND Performed at [facility] [address], [place], [place] [address]    Special Requests   Final    BOTTLES DRAWN AEROBIC AND ANAEROBIC Blood Culture adequate volume Performed at [facility] [address] [place], [place] [address]    Culture   Final    NO GROWTH 5 DAYS Performed at [facility], [place] [address], [place], [place] [address]    Report Status 06/25/2021 FINAL  Final  Culture, blood (routine x 2)     Status: None   Collection Time: 06/19/2021  9:00 PM   Specimen: BLOOD  Result Value Ref Range Status   Specimen Description   Final    BLOOD BLOOD RIGHT HAND Performed at [facility] [address] [place], [place] [address]    Special Requests   Final    BOTTLES DRAWN AEROBIC AND ANAEROBIC Blood Culture adequate volume Performed at [facility] [address] [place], [place] [address]    Culture   Final    NO GROWTH 5 DAYS Performed at [facility], [place] [address], [place], [place] [address]    Report Status 06/25/2021 FINAL  Final         Radiology Studies: No results found.      Scheduled Meds:  amLODipine  10 mg Oral Daily   atorvastatin  40 mg Oral QHS   fluconazole  100 mg Oral Daily   FLUoxetine  20 mg Oral Daily   insulin aspart  0-20 Units Subcutaneous TID WC   insulin glargine-yfgn  10 Units Subcutaneous Daily   isosorbide mononitrate  60 mg Oral Daily   levothyroxine  50 mcg Oral Q0600   linaclotide  72  mcg Oral QAC breakfast   megestrol  400 mg Oral Daily  metoprolol succinate  50 mg Oral Daily   pantoprazole  40 mg Oral Q0600   peg 3350 powder  0.5 kit Oral Once   peg 3350 powder  0.5 kit Oral Once   polyethylene glycol  17 g Oral Daily   polyethylene glycol powder  0.5 Container Oral Once   Continuous Infusions:     LOS: 8 days    Time spent: 25 mins.More than 50% of that time was spent in counseling and/or coordination of care.      Shelly Coss, MD [facility] P9/23/2022, 8:23 AM

## 2021-06-29 NOTE — Plan of Care (Signed)
  Problem: Clinical Measurements: Goal: Will remain free from infection Outcome: Progressing Goal: Diagnostic test results will improve Outcome: Progressing Goal: Respiratory complications will improve Outcome: Progressing Goal: Cardiovascular complication will be avoided Outcome: Progressing   Problem: Nutrition: Goal: Adequate nutrition will be maintained Outcome: Progressing   Problem: Elimination: Goal: Will not experience complications related to urinary retention Outcome: Progressing   Problem: Pain Managment: Goal: General experience of comfort will improve Outcome: Progressing   Problem: Safety: Goal: Ability to remain free from injury will improve Outcome: Progressing   Problem: Skin Integrity: Goal: Risk for impaired skin integrity will decrease Outcome: Progressing   Problem: Education: Goal: Knowledge of General Education information will improve Description: Including pain rating scale, medication(s)/side effects and non-pharmacologic comfort measures Outcome: Not Progressing   Problem: Health Behavior/Discharge Planning: Goal: Ability to manage health-related needs will improve Outcome: Not Progressing   Problem: Clinical Measurements: Goal: Ability to maintain clinical measurements within normal limits will improve Outcome: Not Progressing   Problem: Activity: Goal: Risk for activity intolerance will decrease Outcome: Not Progressing   Problem: Coping: Goal: Level of anxiety will decrease Outcome: Not Progressing   Problem: Elimination: Goal: Will not experience complications related to bowel motility Outcome: Not Progressing

## 2021-06-29 NOTE — Progress Notes (Signed)
Patient refused to take bedtime Lipitor.

## 2021-06-29 NOTE — Plan of Care (Signed)
  Problem: Pain Managment: Goal: General experience of comfort will improve Outcome: Progressing   Problem: Safety: Goal: Ability to remain free from injury will improve Outcome: Progressing   Problem: Skin Integrity: Goal: Risk for impaired skin integrity will decrease Outcome: Progressing   

## 2021-06-29 NOTE — TOC Progression Note (Addendum)
Transition of Care [facility]) - Progression Note    Patient Details  Name: KARIN OHLENDORF MRN: [id number] Date of Birth: [birth year]  Transition of Care [facility]) CM/SW Contact  Ross Ludwig, [place] Phone Number: 06/29/2021, 9:51 AM  Clinical Narrative:     CSW presented updated SNF bed offers to patient's daughter Chrystal [phone].  CSW requested that she let CSW know about bed choice by the end of the day today to get the bed reserved.  CSW spoke to patient's daughter, she is touring a few facilities today to make a decision.  CSW will ask weekend TOC to follow up with daughter on bed offer decision.    Expected Discharge Plan: Long Term Nursing Home Barriers to Discharge: Continued Medical Work up  Expected Discharge Plan and Services Expected Discharge Plan: [place]   Discharge Planning Services: CM Consult Post Acute Care Choice: [place] arrangements for the past 2 months: Single Family Home                           HH Arranged: RN, PT, OT, Nurse's Aide, Social Work [employer] Agency: [place] ([place]) Date [employer]: 06/22/21 Time [place]: 70 Representative spoke with at [place]: [place] ([place]) Interventions    Readmission Risk Interventions Readmission Risk Prevention Plan 04/24/2021  Transportation Screening Complete  PCP or Specialist appointment within 3-5 days of discharge Complete  HRI or [place] Complete  SW Recovery Care/Counseling Consult Complete  [place] Not Applicable  Some recent data might be hidden

## 2021-06-30 DIAGNOSIS — E872 Acidosis: Secondary | ICD-10-CM | POA: Diagnosis not present

## 2021-06-30 LAB — CBC WITH DIFFERENTIAL/PLATELET
Abs Immature Granulocytes: 1.07 10*3/uL — ABNORMAL HIGH (ref 0.00–0.07)
Basophils Absolute: 0.1 10*3/uL (ref 0.0–0.1)
Basophils Relative: 1 %
Eosinophils Absolute: 0.8 10*3/uL — ABNORMAL HIGH (ref 0.0–0.5)
Eosinophils Relative: 5 %
HCT: 31.3 % — ABNORMAL LOW (ref 36.0–46.0)
Hemoglobin: 10.2 g/dL — ABNORMAL LOW (ref 12.0–15.0)
Immature Granulocytes: 7 %
Lymphocytes Relative: 28 %
Lymphs Abs: 4.2 10*3/uL — ABNORMAL HIGH (ref 0.7–4.0)
MCH: 33.1 pg (ref 26.0–34.0)
MCHC: 32.6 g/dL (ref 30.0–36.0)
MCV: 101.6 fL — ABNORMAL HIGH (ref 80.0–100.0)
Monocytes Absolute: 0.8 10*3/uL (ref 0.1–1.0)
Monocytes Relative: 5 %
Neutro Abs: 8 10*3/uL — ABNORMAL HIGH (ref 1.7–7.7)
Neutrophils Relative %: 54 %
Platelets: 360 10*3/uL (ref 150–400)
RBC: 3.08 MIL/uL — ABNORMAL LOW (ref 3.87–5.11)
RDW: 17.9 % — ABNORMAL HIGH (ref 11.5–15.5)
WBC: 14.9 10*3/uL — ABNORMAL HIGH (ref 4.0–10.5)
nRBC: 0.7 % — ABNORMAL HIGH (ref 0.0–0.2)

## 2021-06-30 LAB — GLUCOSE, CAPILLARY
Glucose-Capillary: 118 mg/dL — ABNORMAL HIGH (ref 70–99)
Glucose-Capillary: 122 mg/dL — ABNORMAL HIGH (ref 70–99)

## 2021-06-30 MED ORDER — ALUM & MAG HYDROXIDE-SIMETH 200-200-20 MG/5ML PO SUSP
30.0000 mL | Freq: Once | ORAL | Status: AC
  Administered 2021-06-30: 30 mL via ORAL
  Filled 2021-06-30: qty 30

## 2021-06-30 MED ORDER — TRAMADOL HCL 50 MG PO TABS
50.0000 mg | ORAL_TABLET | Freq: Four times a day (QID) | ORAL | Status: DC | PRN
  Administered 2021-06-30 – 2021-07-02 (×3): 50 mg via ORAL
  Filled 2021-06-30 (×3): qty 1

## 2021-06-30 MED ORDER — OXYCODONE HCL 5 MG PO TABS
5.0000 mg | ORAL_TABLET | Freq: Four times a day (QID) | ORAL | Status: DC | PRN
  Administered 2021-06-30: 5 mg via ORAL
  Filled 2021-06-30: qty 1

## 2021-06-30 NOTE — Progress Notes (Signed)
3 stools through out night , diarrhea in nature. Uncooperative, refusing all meds

## 2021-06-30 NOTE — TOC Progression Note (Signed)
Transition of Care [facility]) - Progression Note    Patient Details  Name: [name] MRN: [id number] Date of Birth: 03-01-1951  Transition of Care [facility]) CM/SW [place], [place] Phone Number: 06/30/2021, 11:41 AM  Clinical Narrative:    CSW spoke with pts daughter about bed offers, she states she is leaning towards [facility]. CSW reached out to [place] with [facility] who states that they may be able to accept pt tomorrow but likely will need to be Monday. Nicole Kindred will call CSW with update this afternoon on when they can accept pt into facility. TOC to follow.   Expected Discharge Plan: Long Term Nursing Home Barriers to Discharge: Continued Medical Work up  Expected Discharge Plan and Services Expected Discharge Plan: [place]   Discharge Planning Services: CM Consult Post Acute Care Choice: [place] arrangements for the past 2 months: Single Family Home                           HH Arranged: RN, PT, OT, Nurse's Aide, Social Work [employer] Agency: [place] ([place]) Date [place]: 06/22/21 Time [place]: 1 Representative spoke with at [place]: [place] ([place]) Interventions    Readmission Risk Interventions Readmission Risk Prevention Plan 04/24/2021  Transportation Screening Complete  PCP or Specialist appointment within 3-5 days of discharge Complete  HRI or [place] Complete  SW Recovery Care/Counseling Consult Complete  [place] Not Applicable  Some recent data might be hidden

## 2021-06-30 NOTE — Progress Notes (Signed)
PROGRESS NOTE    [name]  UTM:[id number] DOB: Sep 03, 1951 DOA: 07/04/2021 PCP: Biagio Borg, MD   Chief Complain: Fast heartbeat.  Brief Narrative: Patient is a 70 year old female with history of anxiety, depression, osteoarthritis, chronic back pain, CKD stage IIIa, coronary disease, diabetes type 2, peptic ulcer disease with gastric ulcer, gastroparesis, hyperlipidemia, hypothyroidism, chronic anemia who presented to the emergency department after she was referred by her PCP for the evaluation of fast heartbeat.  On presentation she looked dehydrated and was started on IV fluids.  Hospital course remarkable for drop in the hemoglobin in the range of 6.  Transfused with a unit of PRBC on 06/23/21. FOBT positive,GI consulted , underwent  EGD.  PT/OT recommending SN facility on discharge.  Medically stable for discharge as soon as possible.  Assessment & Plan:   Principal Problem:   Lactic acidosis Active Problems:   HLD (hyperlipidemia)   Anemia   Depression   Coronary atherosclerosis   GERD   Hypothyroidism   CKD (chronic kidney disease), stage III (HCC)   Essential (primary) hypertension   Insulin dependent type 2 diabetes mellitus (HCC)   Leukocytosis   Generalized weakness   Tachycardia   Heme positive stool   Acute gastritis   Acute on chronic macrocytic anemia: Her baseline hemoglobin is in the range of 8-10.  Hemoglobin suddenly dropped from 9 to 6.4 during this hospitalization .    She has chronic macrocytosis.  FOBT positive patient is a very poor historian but said she had black stools but as per the report of nursing facility where is recently was , she  did not have melena or bloody stools.  Denied any abdominal pain.  Vitamin F68 and folic acid level checked on 05/18/2021 are normal.  She was transfused with a unit of PRBC on 06/23/21.GI consulted,underwent  EGD with finding of esophageal candidiasis, 2 cm hiatal hernia, gastritis with erosions.  Continue PPI daily,  started on fluconazole for candidiasis.  She could not tolerate colonoscopy.  Hemoglobin currently stable, 10 today  Cognitive impairment/confusion: Patient has cognitive impairment at baseline now for a while.  No documented history of dementia.  Urinalysis did not show any signs of UTI.  MRI cudnt be done because she has a neurostimulator in her back.  Extensive work-up was done about her encephalopathy when she was admitted on July 2022 at [facility].  Neurology was also consulted during that admission and neurology recommend outpatient follow-up.  Depression/anxiety: On Prozac at home  History of coronary artery disease: She was on aspirin.  Will hold for now due to drop in the hemoglobin  Hypertension: Blood pressure has been stable after restarted on home medicines.  Continue to monitor blood pressure.  On metoprolol, amlodipine, Imdur.  Was also on losartan/hydrochlorothiazide at home, currently on hold,we will resume if she remains hypertensive  GERD: Continue Protonix  Hypothyroidism: Continue Synthyroid  CKD stage IIIa: Currently kidney function at baseline  Constipation: Continue aggressive bowel regimen  Diabetes type 2: On insulin at home. Continue current insulin regimen.  Monitor blood sugars.  Debility/deconditioning: Patient seen by PT/OT recommended SNF on DC.  Patient is chronically wheelchair-bound.  Recently she had right femur fracture, status post ORIF.Daughter wants her to be placed in SNF.  TOC following.  Morbid obesity: BMI of 40.6           DVT prophylaxis:SCD Code Status: Full Family Communication: Daughter on phone on 06/29/2021 Status is: Inpatient  Remains inpatient appropriate because:Inpatient level  of care appropriate due to severity of illness  Dispo: The patient is from: Home              Anticipated d/c is to: SNF              Patient currently is medically stable for dc   Difficult to place patient No     Consultants:  None  Procedures:None  Antimicrobials:  Anti-infectives (From admission, onward)    Start     Dose/Rate Route Frequency Ordered Stop   06/29/21 1000  fluconazole (DIFLUCAN) tablet 100 mg        100 mg Oral Daily 06/28/21 1512 07/12/21 0959   06/28/21 1630  fluconazole (DIFLUCAN) tablet 200 mg        200 mg Oral  Once 06/28/21 1512 06/28/21 1655       Subjective:  Patient seen and examined at the bedside this morning.  Hemodynamically stable, sleeping.  Looks comfortable.  No new issues  Objective: Vitals:   06/29/21 0413 06/29/21 1010 06/29/21 1957 06/30/21 0511  BP: 140/85 (!) 144/84 128/72 134/72  Pulse: 87 87 88 93  Resp: $Remo'20  16 20  'sYjGs$ Temp: 97.9 F (36.6 C)  98.6 F (37 C) (!) 97.3 F (36.3 C)  TempSrc: Oral  Axillary Oral  SpO2: 100%   100%  Weight:      Height:        Intake/Output Summary (Last 24 hours) at 06/30/2021 1119 Last data filed at 06/29/2021 1800 Gross per 24 hour  Intake 120 ml  Output 600 ml  Net -480 ml    Filed Weights   06/21/2021 1800 06/21/21 1101 06/21/21 1403  Weight: 108.9 kg 108.9 kg 110.8 kg    Examination:   General exam: Overall comfortable, not in distress, Obese, very deconditioned  HEENT: PERRL Respiratory system:  no wheezes or crackles  Cardiovascular system: S1 & S2 heard, RRR.  Gastrointestinal system: Abdomen is nondistended, soft and nontender. Central nervous system: Alert and awake but not oriented Extremities: No edema, no clubbing ,no cyanosis Skin: No rashes, no ulcers,no icterus    Data Reviewed: I have personally reviewed following labs and imaging studies  CBC: Recent Labs  Lab 06/25/21 0416 06/26/21 0813 06/27/21 0718 06/28/21 0448 06/30/21 1002  WBC 10.2 11.5* 11.6* 10.1 14.9*  NEUTROABS 5.9 7.5 7.5 6.4 8.0*  HGB 7.2* 8.2* 8.8* 8.2* 10.2*  HCT 21.6* 25.7* 27.4* 25.7* 31.3*  MCV 97.7 101.2* 100.7* 102.4* 101.6*  PLT 281 310 345 323 423   Basic Metabolic Panel: Recent Labs  Lab 06/24/21 1004  06/27/21 0718  NA 142 139  K 3.6 4.1  CL 115* 108  CO2 19* 21*  GLUCOSE 201* 108*  BUN 16 14  CREATININE 1.07* 0.97  CALCIUM 9.4 9.3   GFR: Estimated Creatinine Clearance: 65.9 mL/min (by C-G formula based on SCr of 0.97 mg/dL). Liver Function Tests: No results for input(s): AST, ALT, ALKPHOS, BILITOT, PROT, ALBUMIN in the last 168 hours.  No results for input(s): LIPASE, AMYLASE in the last 168 hours.  No results for input(s): AMMONIA in the last 168 hours. Coagulation Profile: No results for input(s): INR, PROTIME in the last 168 hours.  Cardiac Enzymes: No results for input(s): CKTOTAL, CKMB, CKMBINDEX, TROPONINI in the last 168 hours.  BNP (last 3 results) No results for input(s): PROBNP in the last 8760 hours. HbA1C: No results for input(s): HGBA1C in the last 72 hours. CBG: Recent Labs  Lab 06/28/21 1617 06/28/21 2241 06/29/21  8850 06/29/21 1143 06/29/21 1620  GLUCAP 78 92 109* 139* 135*   Lipid Profile: No results for input(s): CHOL, HDL, LDLCALC, TRIG, CHOLHDL, LDLDIRECT in the last 72 hours. Thyroid Function Tests: No results for input(s): TSH, T4TOTAL, FREET4, T3FREE, THYROIDAB in the last 72 hours. Anemia Panel: No results for input(s): VITAMINB12, FOLATE, FERRITIN, TIBC, IRON, RETICCTPCT in the last 72 hours.  Sepsis Labs: No results for input(s): PROCALCITON, LATICACIDVEN in the last 168 hours.   Recent Results (from the past 240 hour(s))  Culture, blood (Routine x 2)     Status: None   Collection Time: 06/14/2021  9:00 PM   Specimen: BLOOD  Result Value Ref Range Status   Specimen Description   Final    BLOOD BLOOD LEFT HAND Performed at [facility] [address] [place], [place] [address]    Special Requests   Final    BOTTLES DRAWN AEROBIC AND ANAEROBIC Blood Culture adequate volume Performed at [facility] [address] [place], [place] [address]    Culture   Final    NO GROWTH 5  DAYS Performed at [facility], [place] [address] [place], [place] [address]    Report Status 06/25/2021 FINAL  Final  Culture, blood (routine x 2)     Status: None   Collection Time: 07/05/2021  9:00 PM   Specimen: BLOOD  Result Value Ref Range Status   Specimen Description   Final    BLOOD BLOOD RIGHT HAND Performed at [facility] [address] [place], [place] [address]    Special Requests   Final    BOTTLES DRAWN AEROBIC AND ANAEROBIC Blood Culture adequate volume Performed at [facility] [address], [place], [place] [address]    Culture   Final    NO GROWTH 5 DAYS Performed at [facility], [place] [address] [place],  [address]    Report Status 06/25/2021 FINAL  Final         Radiology Studies: No results found.      Scheduled Meds:  amLODipine  10 mg Oral Daily   atorvastatin  40 mg Oral QHS   fluconazole  100 mg Oral Daily   FLUoxetine  20 mg Oral Daily   insulin aspart  0-20 Units Subcutaneous TID WC   insulin glargine-yfgn  10 Units Subcutaneous Daily   isosorbide mononitrate  60 mg Oral Daily   levothyroxine  50 mcg Oral Q0600   linaclotide  72 mcg Oral QAC breakfast   megestrol  400 mg Oral Daily   metoprolol succinate  50 mg Oral Daily   pantoprazole  40 mg Oral Q0600   peg 3350 powder  0.5 kit Oral Once   peg 3350 powder  0.5 kit Oral Once   polyethylene glycol  17 g Oral Daily   polyethylene glycol powder  0.5 Container Oral Once   Continuous Infusions:     LOS: 9 days    Time spent: 25 mins.More than 50% of that time was spent in counseling and/or coordination of care.      Shelly Coss, MD [facility] P9/24/2022, 11:19 AM

## 2021-07-01 DIAGNOSIS — E872 Acidosis: Secondary | ICD-10-CM | POA: Diagnosis not present

## 2021-07-01 LAB — GLUCOSE, CAPILLARY
Glucose-Capillary: 126 mg/dL — ABNORMAL HIGH (ref 70–99)
Glucose-Capillary: 129 mg/dL — ABNORMAL HIGH (ref 70–99)
Glucose-Capillary: 112 mg/dL — ABNORMAL HIGH (ref 70–99)

## 2021-07-01 NOTE — Progress Notes (Signed)
RN is speaking with Daughter Chrystal on the phone who is the patients POA, we are going to update the password to [id number].

## 2021-07-01 NOTE — Progress Notes (Signed)
1400, RN was feeding patient and someone called the phone, the patient asked for the phone, RN heard someone yelling at patient, RN asked patient if she wanted to talk to the person and patient stated "yes". RN remained in the room for a few more minutes, RN then asked the patient who was on the phone and the patient stated "her husband". Charge Nurse made aware immediately and patient immediately transferred rooms. The initial room number nor new room number were released by floor staff.

## 2021-07-01 NOTE — TOC Progression Note (Addendum)
Transition of Care [facility]) - Progression Note    Patient Details  Name: [name] MRN: [id number] Date of Birth: March 31, 1951  Transition of Care [facility]) CM/SW [place], LCSW Phone Number: 07/01/2021, 2:25 PM  Clinical Narrative:     CSW attempted to follow up with [facility] about pt potential d/c to SNF , no response. TOC continue to follow.    [place]: 2:40pm CSW received call from [employer] , he stated if pt is unable to transport by 5pm , she will need to come in the morning, CSW contact [facility] , who stated there is a waiting list, they are unable to determine a time frame. Pt is unable to transport by 5pm today expected d/c will be tomorrow.  CSW infromed pts daughter of expected d/c. TOC continue to follow.  Expected Discharge Plan: Long Term Nursing Home Barriers to Discharge: Continued Medical Work up  Expected Discharge Plan and Services Expected Discharge Plan: [place]   Discharge Planning Services: CM Consult Post Acute Care Choice: [place] arrangements for the past 2 months: Single Family Home                           HH Arranged: RN, PT, OT, Nurse's Aide, Social Work [employer] Agency: [place] ([place]) Date [place]: 06/22/21 Time [place]: 33 Representative spoke with at [place]: [place] ([place]) Interventions    Readmission Risk Interventions Readmission Risk Prevention Plan 04/24/2021  Transportation Screening Complete  PCP or Specialist appointment within 3-5 days of discharge Complete  HRI or [place] Complete  SW Recovery Care/Counseling Consult Complete  [place] Not Applicable  Some recent data might be hidden

## 2021-07-01 NOTE — Progress Notes (Signed)
PROGRESS NOTE    [name]  XIP:[id number] DOB: [birth year] DOA: 06/27/2021 PCP: Biagio Borg, MD   Chief Complain: Fast heartbeat.  Brief Narrative: Patient is a 70 year old female with history of anxiety, depression, osteoarthritis, chronic back pain, CKD stage IIIa, coronary disease, diabetes type 2, peptic ulcer disease with gastric ulcer, gastroparesis, hyperlipidemia, hypothyroidism, chronic anemia who presented to the emergency department after she was referred by her PCP for the evaluation of fast heartbeat.  On presentation she looked dehydrated and was started on IV fluids.  Hospital course remarkable for drop in the hemoglobin in the range of 6.  Transfused with a unit of PRBC on 06/23/21. FOBT positive,GI consulted , underwent  EGD.  PT/OT recommending SN facility on discharge.  Medically stable for discharge as soon as possible.  Assessment & Plan:   Principal Problem:   Lactic acidosis Active Problems:   HLD (hyperlipidemia)   Anemia   Depression   Coronary atherosclerosis   GERD   Hypothyroidism   CKD (chronic kidney disease), stage III (HCC)   Essential (primary) hypertension   Insulin dependent type 2 diabetes mellitus (HCC)   Leukocytosis   Generalized weakness   Tachycardia   Heme positive stool   Acute gastritis   Acute on chronic macrocytic anemia: Her baseline hemoglobin is in the range of 8-10.  Hemoglobin suddenly dropped from 9 to 6.4 during this hospitalization .    She has chronic macrocytosis.  FOBT positive patient is a very poor historian but said she had black stools but as per the report of nursing facility where is recently was , she  did not have melena or bloody stools.  Denied any abdominal pain.  Vitamin Q73 and folic acid level checked on 05/18/2021 are normal.  She was transfused with a unit of PRBC on 06/23/21.GI consulted,underwent  EGD with finding of esophageal candidiasis, 2 cm hiatal hernia, gastritis with erosions.  Continue PPI daily,  started on fluconazole for candidiasis.  She could not tolerate colonoscopy.  Hemoglobin currently stable  Cognitive impairment/confusion: Patient has cognitive impairment at baseline now for a while.  No documented history of dementia.  Urinalysis did not show any signs of UTI.  MRI cudnt be done because she has a neurostimulator in her back.  Extensive work-up was done about her encephalopathy when she was admitted on July 2022 at [facility].  Neurology was also consulted during that admission and neurology recommend outpatient follow-up.  Depression/anxiety: On Prozac at home  History of coronary artery disease: She was on aspirin.On hold now due to drop in the hemoglobin  Hypertension: Blood pressure has been stable after restarted on home medicines.  Continue to monitor blood pressure.  On metoprolol, amlodipine, Imdur.  Was also on losartan/hydrochlorothiazide at home, currently on hold,we will resume if she remains hypertensive  GERD: Continue Protonix  Hypothyroidism: Continue Synthyroid  CKD stage IIIa: Currently kidney function at baseline  Constipation: Continue aggressive bowel regimen  Diabetes type 2: On insulin at home. Continue current insulin regimen.  Monitor blood sugars.  Debility/deconditioning: Patient seen by PT/OT recommended SNF on DC.  Patient is chronically wheelchair-bound.  Recently she had right femur fracture, status post ORIF.Daughter wants her to be placed in SNF.  TOC following.  Morbid obesity: BMI of 40.6           DVT prophylaxis:SCD Code Status: Full Family Communication: Daughter on phone on 06/29/2021 Status is: Inpatient  Remains inpatient appropriate because:Inpatient level of care appropriate due to  severity of illness  Dispo: The patient is from: Home              Anticipated d/c is to: SNF              Patient currently is medically stable for dc   Difficult to place patient No     Consultants:  None  Procedures:None  Antimicrobials:  Anti-infectives (From admission, onward)    Start     Dose/Rate Route Frequency Ordered Stop   06/29/21 1000  fluconazole (DIFLUCAN) tablet 100 mg        100 mg Oral Daily 06/28/21 1512 07/12/21 0959   06/28/21 1630  fluconazole (DIFLUCAN) tablet 200 mg        200 mg Oral  Once 06/28/21 1512 06/28/21 1655       Subjective:  Patient seen and examined at the bedside this morning.  Hemodynamically stable.  No new issues.  Lying on bed, confused  Objective: Vitals:   06/30/21 1149 06/30/21 1256 06/30/21 1953 07/01/21 0539  BP: (!) 157/100 134/73 (!) 141/81 132/77  Pulse: 92 94 86 91  Resp:  $Remo'14 18 18  'BdhiH$ Temp:  97.6 F (36.4 C) 98.3 F (36.8 C) 98.2 F (36.8 C)  TempSrc:  Oral Oral Oral  SpO2: 98% 99% 100% 100%  Weight:      Height:        Intake/Output Summary (Last 24 hours) at 07/01/2021 5056 Last data filed at 07/01/2021 0600 Gross per 24 hour  Intake 60 ml  Output 600 ml  Net -540 ml    Filed Weights   06/29/2021 1800 06/21/21 1101 06/21/21 1403  Weight: 108.9 kg 108.9 kg 110.8 kg    Examination:   General exam: Overall comfortable, not in distress,obese HEENT: PERRL Respiratory system:  no wheezes or crackles  Cardiovascular system: S1 & S2 heard, RRR.  Gastrointestinal system: Abdomen is nondistended, soft and nontender. Central nervous system: Alert and awake but not  oriented Extremities: No edema, no clubbing ,no cyanosis Skin: No rashes, no ulcers,no icterus    Data Reviewed: I have personally reviewed following labs and imaging studies  CBC: Recent Labs  Lab 06/25/21 0416 06/26/21 0813 06/27/21 0718 06/28/21 0448 06/30/21 1002  WBC 10.2 11.5* 11.6* 10.1 14.9*  NEUTROABS 5.9 7.5 7.5 6.4 8.0*  HGB 7.2* 8.2* 8.8* 8.2* 10.2*  HCT 21.6* 25.7* 27.4* 25.7* 31.3*  MCV 97.7 101.2* 100.7* 102.4* 101.6*  PLT 281 310 345 323 979   Basic Metabolic Panel: Recent Labs  Lab 06/24/21 1004 06/27/21 0718  NA 142  139  K 3.6 4.1  CL 115* 108  CO2 19* 21*  GLUCOSE 201* 108*  BUN 16 14  CREATININE 1.07* 0.97  CALCIUM 9.4 9.3   GFR: Estimated Creatinine Clearance: 65.9 mL/min (by C-G formula based on SCr of 0.97 mg/dL). Liver Function Tests: No results for input(s): AST, ALT, ALKPHOS, BILITOT, PROT, ALBUMIN in the last 168 hours.  No results for input(s): LIPASE, AMYLASE in the last 168 hours.  No results for input(s): AMMONIA in the last 168 hours. Coagulation Profile: No results for input(s): INR, PROTIME in the last 168 hours.  Cardiac Enzymes: No results for input(s): CKTOTAL, CKMB, CKMBINDEX, TROPONINI in the last 168 hours.  BNP (last 3 results) No results for input(s): PROBNP in the last 8760 hours. HbA1C: No results for input(s): HGBA1C in the last 72 hours. CBG: Recent Labs  Lab 06/29/21 0728 06/29/21 1143 06/29/21 1620 06/30/21 1256 06/30/21 2034  GLUCAP  109* 139* 135* 118* 122*   Lipid Profile: No results for input(s): CHOL, HDL, LDLCALC, TRIG, CHOLHDL, LDLDIRECT in the last 72 hours. Thyroid Function Tests: No results for input(s): TSH, T4TOTAL, FREET4, T3FREE, THYROIDAB in the last 72 hours. Anemia Panel: No results for input(s): VITAMINB12, FOLATE, FERRITIN, TIBC, IRON, RETICCTPCT in the last 72 hours.  Sepsis Labs: No results for input(s): PROCALCITON, LATICACIDVEN in the last 168 hours.   No results found for this or any previous visit (from the past 240 hour(s)).        Radiology Studies: No results found.      Scheduled Meds:  amLODipine  10 mg Oral Daily   atorvastatin  40 mg Oral QHS   fluconazole  100 mg Oral Daily   FLUoxetine  20 mg Oral Daily   insulin aspart  0-20 Units Subcutaneous TID WC   insulin glargine-yfgn  10 Units Subcutaneous Daily   isosorbide mononitrate  60 mg Oral Daily   levothyroxine  50 mcg Oral Q0600   linaclotide  72 mcg Oral QAC breakfast   megestrol  400 mg Oral Daily   metoprolol succinate  50 mg Oral Daily    pantoprazole  40 mg Oral Q0600   peg 3350 powder  0.5 kit Oral Once   peg 3350 powder  0.5 kit Oral Once   polyethylene glycol  17 g Oral Daily   polyethylene glycol powder  0.5 Container Oral Once   Continuous Infusions:     LOS: 10 days    Time spent: 25 mins.More than 50% of that time was spent in counseling and/or coordination of care.      Shelly Coss, MD [facility] P9/25/2022, 8:08 AM

## 2021-07-01 NOTE — TOC Progression Note (Deleted)
Transition of Care [facility]) - Progression Note    Patient Details  Name: [name] MRN: [id number] Date of Birth: Oct 15, 1950  Transition of Care [facility]) CM/SW [place], LCSW Phone Number: 07/01/2021, 2:08 PM  Clinical Narrative:    CSW received fax from [facility] requesting additional paperwork for Auth. CSW uploaded pt's Facesheet to portal.   Arlie Solomons.Jacquelynne Guedes, MSW, [place]  Transitions of Care Clinical Social Worker I Direct Dial: [phone]  Fax: [phone] [email or web address]     Expected Discharge Plan: Long Term Nursing Home Barriers to Discharge: Continued Medical Work up  Expected Discharge Plan and Services Expected Discharge Plan: [place]   Discharge Planning Services: CM Consult Post Acute Care Choice: [place] arrangements for the past 2 months: Single Family Home                           HH Arranged: RN, PT, OT, Nurse's Aide, Social Work [employer] Agency: [place] ([place]) Date [place]: 06/22/21 Time [place]: 44 Representative spoke with at [place]: [place] ([place]) Interventions    Readmission Risk Interventions Readmission Risk Prevention Plan 04/24/2021  Transportation Screening Complete  PCP or Specialist appointment within 3-5 days of discharge Complete  HRI or [place] Complete  SW Recovery Care/Counseling Consult Complete  [place] Not Applicable  Some recent data might be hidden

## 2021-07-02 DIAGNOSIS — N1831 Chronic kidney disease, stage 3a: Secondary | ICD-10-CM | POA: Diagnosis not present

## 2021-07-02 DIAGNOSIS — R4020 Unspecified coma: Secondary | ICD-10-CM | POA: Diagnosis not present

## 2021-07-02 DIAGNOSIS — E039 Hypothyroidism, unspecified: Secondary | ICD-10-CM | POA: Diagnosis not present

## 2021-07-02 DIAGNOSIS — S72451D Displaced supracondylar fracture without intracondylar extension of lower end of right femur, subsequent encounter for closed fracture with routine healing: Secondary | ICD-10-CM | POA: Diagnosis not present

## 2021-07-02 DIAGNOSIS — E1165 Type 2 diabetes mellitus with hyperglycemia: Secondary | ICD-10-CM | POA: Diagnosis not present

## 2021-07-02 DIAGNOSIS — I1310 Hypertensive heart and chronic kidney disease without heart failure, with stage 1 through stage 4 chronic kidney disease, or unspecified chronic kidney disease: Secondary | ICD-10-CM | POA: Diagnosis not present

## 2021-07-02 DIAGNOSIS — I10 Essential (primary) hypertension: Secondary | ICD-10-CM | POA: Diagnosis not present

## 2021-07-02 DIAGNOSIS — Z9689 Presence of other specified functional implants: Secondary | ICD-10-CM | POA: Diagnosis not present

## 2021-07-02 DIAGNOSIS — Z794 Long term (current) use of insulin: Secondary | ICD-10-CM | POA: Diagnosis not present

## 2021-07-02 DIAGNOSIS — M6281 Muscle weakness (generalized): Secondary | ICD-10-CM | POA: Diagnosis not present

## 2021-07-02 DIAGNOSIS — E119 Type 2 diabetes mellitus without complications: Secondary | ICD-10-CM | POA: Diagnosis not present

## 2021-07-02 DIAGNOSIS — M1990 Unspecified osteoarthritis, unspecified site: Secondary | ICD-10-CM | POA: Diagnosis not present

## 2021-07-02 DIAGNOSIS — F329 Major depressive disorder, single episode, unspecified: Secondary | ICD-10-CM | POA: Diagnosis not present

## 2021-07-02 DIAGNOSIS — E872 Acidosis: Secondary | ICD-10-CM | POA: Diagnosis not present

## 2021-07-02 DIAGNOSIS — R109 Unspecified abdominal pain: Secondary | ICD-10-CM | POA: Diagnosis not present

## 2021-07-02 DIAGNOSIS — E1122 Type 2 diabetes mellitus with diabetic chronic kidney disease: Secondary | ICD-10-CM | POA: Diagnosis not present

## 2021-07-02 DIAGNOSIS — R531 Weakness: Secondary | ICD-10-CM | POA: Diagnosis not present

## 2021-07-02 DIAGNOSIS — M79675 Pain in left toe(s): Secondary | ICD-10-CM | POA: Diagnosis not present

## 2021-07-02 DIAGNOSIS — K5900 Constipation, unspecified: Secondary | ICD-10-CM | POA: Diagnosis not present

## 2021-07-02 DIAGNOSIS — M79674 Pain in right toe(s): Secondary | ICD-10-CM | POA: Diagnosis not present

## 2021-07-02 DIAGNOSIS — Z7401 Bed confinement status: Secondary | ICD-10-CM | POA: Diagnosis not present

## 2021-07-02 DIAGNOSIS — Z741 Need for assistance with personal care: Secondary | ICD-10-CM | POA: Diagnosis not present

## 2021-07-02 DIAGNOSIS — G629 Polyneuropathy, unspecified: Secondary | ICD-10-CM | POA: Diagnosis not present

## 2021-07-02 DIAGNOSIS — R000 Tachycardia, unspecified: Secondary | ICD-10-CM | POA: Diagnosis not present

## 2021-07-02 DIAGNOSIS — Z9622 Myringotomy tube(s) status: Secondary | ICD-10-CM | POA: Diagnosis not present

## 2021-07-02 DIAGNOSIS — B351 Tinea unguium: Secondary | ICD-10-CM | POA: Diagnosis not present

## 2021-07-02 DIAGNOSIS — R0689 Other abnormalities of breathing: Secondary | ICD-10-CM | POA: Diagnosis not present

## 2021-07-02 DIAGNOSIS — F331 Major depressive disorder, recurrent, moderate: Secondary | ICD-10-CM | POA: Diagnosis not present

## 2021-07-02 DIAGNOSIS — E1169 Type 2 diabetes mellitus with other specified complication: Secondary | ICD-10-CM | POA: Diagnosis not present

## 2021-07-02 DIAGNOSIS — R5381 Other malaise: Secondary | ICD-10-CM | POA: Diagnosis not present

## 2021-07-02 DIAGNOSIS — K279 Peptic ulcer, site unspecified, unspecified as acute or chronic, without hemorrhage or perforation: Secondary | ICD-10-CM | POA: Diagnosis not present

## 2021-07-02 DIAGNOSIS — M549 Dorsalgia, unspecified: Secondary | ICD-10-CM | POA: Diagnosis not present

## 2021-07-02 DIAGNOSIS — R293 Abnormal posture: Secondary | ICD-10-CM | POA: Diagnosis not present

## 2021-07-02 DIAGNOSIS — G3184 Mild cognitive impairment, so stated: Secondary | ICD-10-CM | POA: Diagnosis not present

## 2021-07-02 DIAGNOSIS — M7989 Other specified soft tissue disorders: Secondary | ICD-10-CM | POA: Diagnosis not present

## 2021-07-02 DIAGNOSIS — Z9189 Other specified personal risk factors, not elsewhere classified: Secondary | ICD-10-CM | POA: Diagnosis not present

## 2021-07-02 DIAGNOSIS — E1159 Type 2 diabetes mellitus with other circulatory complications: Secondary | ICD-10-CM | POA: Diagnosis not present

## 2021-07-02 DIAGNOSIS — Z23 Encounter for immunization: Secondary | ICD-10-CM | POA: Diagnosis not present

## 2021-07-02 DIAGNOSIS — I152 Hypertension secondary to endocrine disorders: Secondary | ICD-10-CM | POA: Diagnosis not present

## 2021-07-02 DIAGNOSIS — W19XXXD Unspecified fall, subsequent encounter: Secondary | ICD-10-CM | POA: Diagnosis not present

## 2021-07-02 DIAGNOSIS — R278 Other lack of coordination: Secondary | ICD-10-CM | POA: Diagnosis not present

## 2021-07-02 DIAGNOSIS — M961 Postlaminectomy syndrome, not elsewhere classified: Secondary | ICD-10-CM | POA: Diagnosis not present

## 2021-07-02 DIAGNOSIS — N183 Chronic kidney disease, stage 3 unspecified: Secondary | ICD-10-CM | POA: Diagnosis not present

## 2021-07-02 DIAGNOSIS — F32A Depression, unspecified: Secondary | ICD-10-CM | POA: Diagnosis not present

## 2021-07-02 DIAGNOSIS — Z5181 Encounter for therapeutic drug level monitoring: Secondary | ICD-10-CM | POA: Diagnosis not present

## 2021-07-02 DIAGNOSIS — I2510 Atherosclerotic heart disease of native coronary artery without angina pectoris: Secondary | ICD-10-CM | POA: Diagnosis not present

## 2021-07-02 DIAGNOSIS — F419 Anxiety disorder, unspecified: Secondary | ICD-10-CM | POA: Diagnosis not present

## 2021-07-02 DIAGNOSIS — I499 Cardiac arrhythmia, unspecified: Secondary | ICD-10-CM | POA: Diagnosis not present

## 2021-07-02 DIAGNOSIS — W19XXXA Unspecified fall, initial encounter: Secondary | ICD-10-CM | POA: Diagnosis not present

## 2021-07-02 DIAGNOSIS — R2681 Unsteadiness on feet: Secondary | ICD-10-CM | POA: Diagnosis not present

## 2021-07-02 DIAGNOSIS — Z96652 Presence of left artificial knee joint: Secondary | ICD-10-CM | POA: Diagnosis not present

## 2021-07-02 DIAGNOSIS — R296 Repeated falls: Secondary | ICD-10-CM | POA: Diagnosis not present

## 2021-07-02 DIAGNOSIS — D649 Anemia, unspecified: Secondary | ICD-10-CM | POA: Diagnosis not present

## 2021-07-02 DIAGNOSIS — R404 Transient alteration of awareness: Secondary | ICD-10-CM | POA: Diagnosis not present

## 2021-07-02 DIAGNOSIS — G9340 Encephalopathy, unspecified: Secondary | ICD-10-CM | POA: Diagnosis not present

## 2021-07-02 DIAGNOSIS — F411 Generalized anxiety disorder: Secondary | ICD-10-CM | POA: Diagnosis not present

## 2021-07-02 DIAGNOSIS — K219 Gastro-esophageal reflux disease without esophagitis: Secondary | ICD-10-CM | POA: Diagnosis not present

## 2021-07-02 DIAGNOSIS — Z7984 Long term (current) use of oral hypoglycemic drugs: Secondary | ICD-10-CM | POA: Diagnosis not present

## 2021-07-02 DIAGNOSIS — M6259 Muscle wasting and atrophy, not elsewhere classified, multiple sites: Secondary | ICD-10-CM | POA: Diagnosis not present

## 2021-07-02 DIAGNOSIS — M5416 Radiculopathy, lumbar region: Secondary | ICD-10-CM | POA: Diagnosis not present

## 2021-07-02 DIAGNOSIS — Z79899 Other long term (current) drug therapy: Secondary | ICD-10-CM | POA: Diagnosis not present

## 2021-07-02 DIAGNOSIS — M25531 Pain in right wrist: Secondary | ICD-10-CM | POA: Diagnosis not present

## 2021-07-02 DIAGNOSIS — E11649 Type 2 diabetes mellitus with hypoglycemia without coma: Secondary | ICD-10-CM | POA: Diagnosis not present

## 2021-07-02 DIAGNOSIS — R4189 Other symptoms and signs involving cognitive functions and awareness: Secondary | ICD-10-CM | POA: Diagnosis not present

## 2021-07-02 DIAGNOSIS — Z7901 Long term (current) use of anticoagulants: Secondary | ICD-10-CM | POA: Diagnosis not present

## 2021-07-02 DIAGNOSIS — D62 Acute posthemorrhagic anemia: Secondary | ICD-10-CM | POA: Diagnosis not present

## 2021-07-02 DIAGNOSIS — Z87891 Personal history of nicotine dependence: Secondary | ICD-10-CM | POA: Diagnosis not present

## 2021-07-02 LAB — CBC WITH DIFFERENTIAL/PLATELET
Abs Immature Granulocytes: 0.07 10*3/uL (ref 0.00–0.07)
Basophils Absolute: 0 10*3/uL (ref 0.0–0.1)
Basophils Relative: 0 %
Eosinophils Absolute: 0.1 10*3/uL (ref 0.0–0.5)
Eosinophils Relative: 2 %
HCT: 27.8 % — ABNORMAL LOW (ref 36.0–46.0)
Hemoglobin: 9 g/dL — ABNORMAL LOW (ref 12.0–15.0)
Immature Granulocytes: 1 %
Lymphocytes Relative: 33 %
Lymphs Abs: 3 10*3/uL (ref 0.7–4.0)
MCH: 33 pg (ref 26.0–34.0)
MCHC: 32.4 g/dL (ref 30.0–36.0)
MCV: 101.8 fL — ABNORMAL HIGH (ref 80.0–100.0)
Monocytes Absolute: 0.5 10*3/uL (ref 0.1–1.0)
Monocytes Relative: 5 %
Neutro Abs: 5.5 10*3/uL (ref 1.7–7.7)
Neutrophils Relative %: 59 %
Platelets: 332 10*3/uL (ref 150–400)
RBC: 2.73 MIL/uL — ABNORMAL LOW (ref 3.87–5.11)
RDW: 17.6 % — ABNORMAL HIGH (ref 11.5–15.5)
WBC: 9.2 10*3/uL (ref 4.0–10.5)
nRBC: 0 % (ref 0.0–0.2)

## 2021-07-02 LAB — BASIC METABOLIC PANEL
Anion gap: 7 (ref 5–15)
BUN: 14 mg/dL (ref 8–23)
CO2: 20 mmol/L — ABNORMAL LOW (ref 22–32)
Calcium: 9.6 mg/dL (ref 8.9–10.3)
Chloride: 114 mmol/L — ABNORMAL HIGH (ref 98–111)
Creatinine, Ser: 1.43 mg/dL — ABNORMAL HIGH (ref 0.44–1.00)
GFR, Estimated: 39 mL/min — ABNORMAL LOW (ref >60)
Glucose, Bld: 103 mg/dL — ABNORMAL HIGH (ref 70–99)
Potassium: 4.2 mmol/L (ref 3.5–5.1)
Sodium: 141 mmol/L (ref 135–145)

## 2021-07-02 LAB — GLUCOSE, CAPILLARY
Glucose-Capillary: 221 mg/dL — ABNORMAL HIGH (ref 70–99)
Glucose-Capillary: 184 mg/dL — ABNORMAL HIGH (ref 70–99)
Glucose-Capillary: 110 mg/dL — ABNORMAL HIGH (ref 70–99)
Glucose-Capillary: 112 mg/dL — ABNORMAL HIGH (ref 70–99)
Glucose-Capillary: 124 mg/dL — ABNORMAL HIGH (ref 70–99)
Glucose-Capillary: 67 mg/dL — ABNORMAL LOW (ref 70–99)
Glucose-Capillary: 93 mg/dL (ref 70–99)
Glucose-Capillary: 87 mg/dL (ref 70–99)
Glucose-Capillary: 107 mg/dL — ABNORMAL HIGH (ref 70–99)
Glucose-Capillary: 131 mg/dL — ABNORMAL HIGH (ref 70–99)
Glucose-Capillary: 87 mg/dL (ref 70–99)
Glucose-Capillary: 108 mg/dL — ABNORMAL HIGH (ref 70–99)
Glucose-Capillary: 111 mg/dL — ABNORMAL HIGH (ref 70–99)

## 2021-07-02 LAB — SARS CORONAVIRUS 2 (TAT 6-24 HRS): SARS Coronavirus 2: NEGATIVE

## 2021-07-02 MED ORDER — POLYETHYLENE GLYCOL 3350 17 G PO PACK: 17.0000 g | PACK | Freq: Every day | ORAL | 0 refills | Status: AC

## 2021-07-02 MED ORDER — VITAMIN D (ERGOCALCIFEROL) 1.25 MG (50000 UNIT) PO CAPS: 50000.0000 [IU] | ORAL_CAPSULE | ORAL | 0 refills | Status: AC

## 2021-07-02 MED ORDER — FLUCONAZOLE 100 MG PO TABS: 100.0000 mg | ORAL_TABLET | Freq: Every day | ORAL | 0 refills | Status: AC

## 2021-07-02 MED ORDER — ALUM & MAG HYDROXIDE-SIMETH 200-200-20 MG/5ML PO SUSP
30.0000 mL | Freq: Once | ORAL | Status: AC
  Administered 2021-07-02: 30 mL via ORAL
  Filled 2021-07-02: qty 30

## 2021-07-02 MED ORDER — INSULIN GLARGINE-YFGN 100 UNIT/ML ~~LOC~~ SOLN: 10.0000 [IU] | Freq: Every day | SUBCUTANEOUS | 0 refills | Status: AC

## 2021-07-02 MED ORDER — TRAMADOL HCL 50 MG PO TABS: 50.0000 mg | ORAL_TABLET | Freq: Four times a day (QID) | ORAL | 0 refills | Status: AC | PRN

## 2021-07-02 NOTE — TOC Transition Note (Signed)
Transition of Care [facility]) - CM/SW Discharge Note   Patient Details  Name: [name] MRN: [id number] Date of Birth: 1951/05/18  Transition of Care [facility]) CM/SW Contact:  Dessa Phi, RN Phone Number: 06/23/2021, 11:16 AM   Clinical Narrative:  d/c today to [facility] rep Janie accepted  faxed d/c summary-going to [id number],nsg tel# for report [phone].[facility] scheduled 12p pick up. No further CM needs.     Final next level of care: Skilled Nursing Facility Barriers to Discharge: No Barriers Identified   Patient Goals and CMS Choice Patient states their goals for this hospitalization and ongoing recovery are:: LTC rehab CMS Medicare.gov Compare Post Acute Care list provided to:: Patient Represenative (must comment) Choice offered to / list presented to : Adult Children  Discharge Placement              Patient [facility] bed at: [facility] Patient to be transferred to facility by: [place] Name of family member notified: Chrystal dtr [phone] Patient and family notified of of transfer: 07/01/2021  Discharge Plan and Services   Discharge Planning Services: CM Consult Post Acute Care Choice: Home Health                    HH Arranged: RN, PT, OT, Nurse's Aide, Social Work [employer] Agency: [place] ([place]) Date HH Agency Contacted: 06/22/21 Time [place]: 27 Representative spoke with at [place]: [place] ([place]) Interventions     Readmission Risk Interventions Readmission Risk Prevention Plan 04/24/2021  Transportation Screening Complete  PCP or Specialist appointment within 3-5 days of discharge Complete  HRI or [place] Complete  SW Recovery Care/Counseling Consult Complete  [place] Not Applicable  Some recent data might be hidden

## 2021-07-02 NOTE — Discharge Summary (Signed)
Physician Discharge Summary  [name] [id number] DOB: 01/13/1951 DOA: 06/10/2021  PCP: Biagio Borg, MD  Admit date: 07/03/2021 Discharge date: 06/18/2021  Admitted From: Home Disposition:  Home  Discharge Condition:Stable CODE STATUS:FULL Diet recommendation: Heart Healthy  Brief/Interim Summary:  Patient is a 70 year old female with history of anxiety, depression, osteoarthritis, chronic back pain, CKD stage IIIa, coronary disease, diabetes type 2, peptic ulcer disease with gastric ulcer, gastroparesis, hyperlipidemia, hypothyroidism, chronic anemia who presented to the emergency department after she was referred by her PCP for the evaluation of fast heartbeat.  On presentation she looked dehydrated and was started on IV fluids.  Hospital course remarkable for drop in the hemoglobin in the range of 6.  Transfused with a unit of PRBC on 06/23/21. FOBT positive,GI consulted , underwent  EGD.  PT/OT recommending SN facility on discharge.  Medically stable for discharge .  Following problems were addressed during her hospitalization:  Acute on chronic macrocytic anemia: Her baseline hemoglobin is in the range of 8-10.  Hemoglobin suddenly dropped from 9 to 6.4 during this hospitalization .    She has chronic macrocytosis.  FOBT positive patient is a very poor historian but said she had black stools but as per the report of nursing facility where is recently was , she  did not have melena or bloody stools.  Denied any abdominal pain.  Vitamin [id number] and folic acid level checked on 05/18/2021 are normal.  She was transfused with a unit of PRBC on 06/23/21.GI consulted,underwent  EGD with finding of esophageal candidiasis, 2 cm hiatal hernia, gastritis with erosions.  Continue PPI , started on fluconazole for candidiasis.  She could not tolerate colonoscopy.  Hemoglobin currently stable.Check CBC in a week   Cognitive impairment/confusion: Patient has cognitive impairment at baseline now for a  while.  No documented history of dementia.  Urinalysis did not show any signs of UTI.  MRI cudnt be done because she has a neurostimulator in her back.  Extensive work-up was done about her encephalopathy when she was admitted on July 2022 at [facility].  Neurology was also consulted during that admission and neurology recommend outpatient follow-up.   Depression/anxiety: On Prozac at home   History of coronary artery disease: She was on aspirin.On hold now due to drop in the hemoglobin   Hypertension: Blood pressure has been stable after restarted on home medicines.   On metoprolol, amlodipine, Imdur.  Was also on losartan/hydrochlorothiazide at home, currently on hold   GERD: Continue Protonix   Hypothyroidism: Continue Synthyroid  CKD stage IIIa: Currently kidney function at baseline   Constipation: Continue aggressive bowel regimen   Diabetes type 2: On insulin at home. Continue current insulin regimen.  Monitor blood sugars.   Debility/deconditioning: Patient seen by PT/OT recommended SNF on DC.  Patient is chronically wheelchair-bound.  Recently she had right femur fracture, status post ORIF.Daughter wants her to be placed in SNF.  TOC following.   Morbid obesity: BMI of 40.6   Discharge Diagnoses:  Principal Problem:   Lactic acidosis Active Problems:   HLD (hyperlipidemia)   Anemia   Depression   Coronary atherosclerosis   GERD   Hypothyroidism   CKD (chronic kidney disease), stage III (HCC)   Essential (primary) hypertension   Insulin dependent type 2 diabetes mellitus (HCC)   Leukocytosis   Generalized weakness   Tachycardia   Heme positive stool   Acute gastritis    Discharge Instructions  Discharge Instructions     Diet -  low sodium heart healthy   Complete by: As directed    Discharge instructions   Complete by: As directed    1)Please take prescribed medications as instructed. 2)Do  a CBC and BMP tests in a week   Increase activity slowly    Complete by: As directed       Allergies as of 06/10/2021       Reactions   Ciprofloxacin Shortness Of Breath, Other (See Comments)   Dizziness   Hydrocodone Shortness Of Breath, Other (See Comments)   Tachycardia   Hydrocortisone    Strawberry (diagnostic) Other (See Comments)   Abdominal pain   Tomato Other (See Comments)   Abdominal pain   Penicillins Hives   Pt just remembers hives, SHE HAS HAD AUGEMTIN WITHOUT ISSUES PER PATIENT no SOB or lightheadedness with hypotension: No Has patient had a PCN reaction causing severe rash involving mucus membranes or skin necrosis: No Has patient had a PCN reaction that required hospitalization No Has patient had a PCN reaction occurring within the last 10 years: No If all of the above answers are "NO", then may proceed with Cephalosporin use.        Medication List     STOP taking these medications    aspirin 81 MG EC tablet   Basaglar KwikPen 100 UNIT/ML   cloNIDine 0.2 MG tablet Commonly known as: CATAPRES   losartan-hydrochlorothiazide 100-25 MG tablet Commonly known as: HYZAAR   Trulicity 1.5 0000000 Sopn Generic drug: Dulaglutide       TAKE these medications    amLODipine 10 MG tablet Commonly known as: NORVASC Take 1 tablet (10 mg total) by mouth daily.   atorvastatin 40 MG tablet Commonly known as: LIPITOR TAKE ONE TABLET BY MOUTH EVERY NIGHT AT BEDTIME   docusate sodium 100 MG capsule Commonly known as: COLACE Take 1 capsule (100 mg total) by mouth 2 (two) times daily as needed for mild constipation.   Elmiron 100 MG capsule Generic drug: pentosan polysulfate Take 200 mg by mouth 2 (two) times daily.   fexofenadine 180 MG tablet Commonly known as: ALLEGRA Take 1 tablet (180 mg total) by mouth daily.   fluconazole 100 MG tablet Commonly known as: DIFLUCAN Take 1 tablet (100 mg total) by mouth daily for 9 days. Start taking on: July 03, 2021   FLUoxetine 20 MG capsule Commonly known as:  PROZAC TAKE ONE CAPSULE BY MOUTH DAILY What changed:  how much to take how to take this when to take this additional instructions   FreeStyle Libre 2 Reader [facility] Use as directed twice daily E11.9   FreeStyle Libre 2 Sensor Misc Use as directed once bi-weekly E11.9   glucose blood test strip Commonly known as: FREESTYLE TEST STRIPS Use as instructed to check blood sugar 4 times daily.   glucose blood test strip Use as instructed   insulin glargine-yfgn 100 UNIT/ML injection Commonly known as: SEMGLEE Inject 0.1 mLs (10 Units total) into the skin daily. Start taking on: July 03, 2021   insulin lispro 100 UNIT/ML injection Commonly known as: HUMALOG Inject 3-20 Units into the skin See admin instructions. Per sliding scale 3 times daily   isosorbide mononitrate 60 MG 24 hr tablet Commonly known as: IMDUR Take 1 tablet (60 mg total) by mouth daily.   levothyroxine 50 MCG tablet Commonly known as: SYNTHROID Take 1 tablet (50 mcg total) by mouth daily at 6 (six) AM.   linaclotide 72 MCG capsule Commonly known as: Linzess Take 1 capsule (  72 mcg total) by mouth daily before breakfast.   megestrol 400 MG/10ML suspension Commonly known as: MEGACE Take 10 mLs (400 mg total) by mouth daily.   metoprolol succinate 50 MG 24 hr tablet Commonly known as: TOPROL-XL TAKE 1 TABLET BY MOUTH DAILY WITH OR IMMEDIATELY FOLLOWING A MEAL What changed:  how much to take how to take this when to take this additional instructions   nitroGLYCERIN 0.4 MG SL tablet Commonly known as: NITROSTAT Place 1 tablet (0.4 mg total) under the tongue every 5 (five) minutes as needed for chest pain.   ondansetron 8 MG disintegrating tablet Commonly known as: Zofran ODT '8mg'$  ODT q4 hours prn nausea What changed:  how much to take how to take this when to take this reasons to take this additional instructions   pantoprazole 40 MG tablet Commonly known as: PROTONIX TAKE 1 TABLET(40 MG) BY  MOUTH TWICE DAILY What changed:  how much to take how to take this when to take this   polyethylene glycol 17 g packet Commonly known as: MIRALAX / GLYCOLAX Take 17 g by mouth daily. Start taking on: July 03, 2021 What changed:  when to take this reasons to take this   traMADol 50 MG tablet Commonly known as: ULTRAM Take 1 tablet (50 mg total) by mouth every 6 (six) hours as needed. What changed: reasons to take this   Vitamin D (Ergocalciferol) 1.25 MG (50000 UNIT) Caps capsule Commonly known as: DRISDOL Take 1 capsule (50,000 Units total) by mouth every 7 (seven) days.        Allergies  Allergen Reactions   Ciprofloxacin Shortness Of Breath and Other (See Comments)    Dizziness    Hydrocodone Shortness Of Breath and Other (See Comments)    Tachycardia   Hydrocortisone    Strawberry (Diagnostic) Other (See Comments)    Abdominal pain   Tomato Other (See Comments)    Abdominal pain   Penicillins Hives    Pt just remembers hives, SHE HAS HAD AUGEMTIN WITHOUT ISSUES PER PATIENT no SOB or lightheadedness with hypotension: No Has patient had a PCN reaction causing severe rash involving mucus membranes or skin necrosis: No Has patient had a PCN reaction that required hospitalization No Has patient had a PCN reaction occurring within the last 10 years: No If all of the above answers are "NO", then may proceed with Cephalosporin use.     Consultations: GI   Procedures/Studies: DG Chest 2 View  Result Date: 06/19/2021 CLINICAL DATA:  Sepsis and hemoptysis EXAM: CHEST - 2 VIEW COMPARISON:  04/14/2021 FINDINGS: The heart size and mediastinal contours are within normal limits. Both lungs are clear. The visualized skeletal structures are unremarkable. IMPRESSION: No active cardiopulmonary disease. Electronically Signed   By: Ulyses Jarred M.D.   On: 06/19/2021 23:21   CT Abdomen Pelvis W Contrast  Result Date: 06/15/2021 CLINICAL DATA:  Abdominal abscess/infection  suspected EXAM: CT ABDOMEN AND PELVIS WITH CONTRAST TECHNIQUE: Multidetector CT imaging of the abdomen and pelvis was performed using the standard protocol following bolus administration of intravenous contrast. CONTRAST:  14m OMNIPAQUE IOHEXOL 350 MG/ML SOLN COMPARISON:  CT abdomen pelvis 05/17/2021 FINDINGS: Lower chest: No acute abnormality. Hepatobiliary: No focal liver abnormality. Status post cholecystectomy. No biliary dilatation. Pancreas: No focal lesion. Normal pancreatic contour. No surrounding inflammatory changes. No main pancreatic ductal dilatation. Spleen: Normal in size without focal abnormality. Adrenals/Urinary Tract: No adrenal nodule bilaterally. Bilateral kidneys enhance symmetrically. No hydronephrosis. No hydroureter. Foci of gas within the  urinary bladder lumen. Otherwise the urinary bladder is unremarkable. On delayed imaging, there is no urothelial wall thickening and there are no filling defects in the opacified portions of the bilateral collecting systems or ureters. Stomach/Bowel: Gastric surgical changes noted. Stomach is within normal limits. No evidence of bowel wall thickening or dilatation. 8 cm stool ball within the rectum. Scattered colonic diverticulosis. Stool throughout the majority of the colon. Appendix appears normal. Vascular/Lymphatic: No abdominal aorta or iliac aneurysm. Mild atherosclerotic plaque of the aorta and its branches. No abdominal, pelvic, or inguinal lymphadenopathy. Reproductive: Status post hysterectomy. No adnexal masses. Other: No intraperitoneal free fluid. No intraperitoneal free gas. No organized fluid collection. Musculoskeletal: Similar-appearing left lumbar hernia with an abdominal defect of 1.9 cm with a short segment of small bowel herniating through. Redemonstration of a small fat containing left inguinal hernia. Question tiny supraumbilical ventral wall fat containing hernia (2:40, 5:123). Left flank subcutaneus soft tissue neurostimulator  with leads entering the central canal at the L1 vertebral body and extending superiorly. Tip not visualized and collimated off view. No suspicious lytic or blastic osseous lesions. No acute displaced fracture. Multilevel degenerative changes of the spine. L3 through L5 posterior and interbody fusion. IMPRESSION: 1. Foci of gas within the urinary bladder lumen. Otherwise unremarkable urinary bladder. Recommend correlation with recent instrumentation. Correlate with urinalysis if clinically indicated. 2. Left lumbar hernia now containing a short loop of small bowel. Small fat containing left inguinal hernia. Question tiny supraumbilical ventral wall fat containing hernia. No findings to suggest associated ischemia or bowel obstruction. 3. An 8 cm stool ball within the proximal to mid rectum with no findings of stercoral colitis. Stool throughout the majority of the colon. 4. Scattered colonic diverticulosis with no acute diverticulitis. 5.  Aortic Atherosclerosis (ICD10-I70.0). Electronically Signed   By: Iven Finn M.D.   On: 06/25/2021 21:42   DG Chest Port 1 View  Result Date: 06/23/2021 CLINICAL DATA:  Tachycardia EXAM: PORTABLE CHEST 1 VIEW COMPARISON:  Chest x-ray 06/19/2021 FINDINGS: The heart and mediastinal contours are unchanged. Aortic calcification. No focal consolidation. No pulmonary edema. No pleural effusion. No pneumothorax. Neurostimulator leads again noted overlying the mid to lower thoracic spine. Degenerative changes of the shoulders. No acute osseous abnormality. IMPRESSION: No active disease. Electronically Signed   By: Iven Finn M.D.   On: 07/01/2021 18:41      Subjective: Patient seen and examined at the bedside this morning.  Hemodynamically stable for discharge today.  Discharge Exam: Vitals:   06/15/2021 0558 06/18/2021 0906  BP: 124/68 125/64  Pulse: 93 88  Resp: 18   Temp: 98.7 F (37.1 C)   SpO2: 99%    Vitals:   07/01/21 1522 07/01/21 1940 07/01/2021 0558  06/26/2021 0906  BP: 111/73 99/72 124/68 125/64  Pulse: 86 88 93 88  Resp: '16 16 18   '$ Temp: 98.3 F (36.8 C) 99 F (37.2 C) 98.7 F (37.1 C)   TempSrc: Oral Oral Oral   SpO2: 98% 100% 99%   Weight:      Height:        General: Pt is alert, awake, not in acute distress Cardiovascular: RRR, S1/S2 +, no rubs, no gallops Respiratory: CTA bilaterally, no wheezing, no rhonchi Abdominal: Soft, NT, ND, bowel sounds + Extremities: no edema, no cyanosis    The results of significant diagnostics from this hospitalization (including imaging, microbiology, ancillary and laboratory) are listed below for reference.     Microbiology: Recent Results (from the past 240  hour(s))  SARS CORONAVIRUS 2 (TAT 6-24 HRS) Nasopharyngeal Nasopharyngeal Swab     Status: None   Collection Time: 07/01/21  4:38 PM   Specimen: Nasopharyngeal Swab  Result Value Ref Range Status   SARS Coronavirus 2 NEGATIVE NEGATIVE Final    Comment: (NOTE) SARS-CoV-2 target nucleic acids are NOT DETECTED.  The SARS-CoV-2 RNA is generally detectable in upper and lower respiratory specimens during the acute phase of infection. Negative results do not preclude SARS-CoV-2 infection, do not rule out co-infections with other pathogens, and should not be used as the sole basis for treatment or other patient management decisions. Negative results must be combined with clinical observations, patient history, and epidemiological information. The expected result is Negative.  Fact Sheet for Patients: SugarRoll.be  Fact Sheet for Healthcare Providers: https://www.woods-mathews.com/  This test is not yet approved or cleared by the [place] FDA and  has been authorized for detection and/or diagnosis of SARS-CoV-2 by FDA under an Emergency Use Authorization (EUA). This EUA will remain  in effect (meaning this test can be used) for the duration of the COVID-19 declaration under Se ction  564(b)(1) of the Act, 21 U.S.C. section 360bbb-3(b)(1), unless the authorization is terminated or revoked sooner.  Performed at [facility], [place] [address] [place], [place] [address]      Labs: BNP (last 3 results) No results for input(s): BNP in the last 8760 hours. Basic Metabolic Panel: Recent Labs  Lab 06/27/21 0718 06/26/2021 0425  NA 139 141  K 4.1 4.2  CL 108 114*  CO2 21* 20*  GLUCOSE 108* 103*  BUN 14 14  CREATININE 0.97 1.43*  CALCIUM 9.3 9.6   Liver Function Tests: No results for input(s): AST, ALT, ALKPHOS, BILITOT, PROT, ALBUMIN in the last 168 hours. No results for input(s): LIPASE, AMYLASE in the last 168 hours. No results for input(s): AMMONIA in the last 168 hours. CBC: Recent Labs  Lab 06/26/21 0813 06/27/21 0718 06/28/21 0448 06/30/21 1002 06/24/2021 0425  WBC 11.5* 11.6* 10.1 14.9* 9.2  NEUTROABS 7.5 7.5 6.4 8.0* 5.5  HGB 8.2* 8.8* 8.2* 10.2* 9.0*  HCT 25.7* 27.4* 25.7* 31.3* 27.8*  MCV 101.2* 100.7* 102.4* 101.6* 101.8*  PLT 310 345 323 360 332   Cardiac Enzymes: No results for input(s): CKTOTAL, CKMB, CKMBINDEX, TROPONINI in the last 168 hours. BNP: Invalid input(s): POCBNP CBG: Recent Labs  Lab 07/01/21 0744 07/01/21 1202 07/01/21 1706 07/01/21 2101 06/26/2021 0747  GLUCAP 87 126* 129* 112* 108*   D-Dimer No results for input(s): DDIMER in the last 72 hours. Hgb A1c No results for input(s): HGBA1C in the last 72 hours. Lipid Profile No results for input(s): CHOL, HDL, LDLCALC, TRIG, CHOLHDL, LDLDIRECT in the last 72 hours. Thyroid function studies No results for input(s): TSH, T4TOTAL, T3FREE, THYROIDAB in the last 72 hours.  Invalid input(s): FREET3 Anemia work up No results for input(s): VITAMINB12, FOLATE, FERRITIN, TIBC, IRON, RETICCTPCT in the last 72 hours. Urinalysis    Component Value Date/Time   COLORURINE YELLOW 06/19/2021 1943   APPEARANCEUR HAZY (A) 06/18/2021 1943   LABSPEC >1.030 (H) 06/12/2021 1943    PHURINE 6.0 06/11/2021 1943   GLUCOSEU 250 (A) 06/23/2021 1943   GLUCOSEU NEGATIVE 01/09/2021 [place] 06/19/2021 [place] 07/04/2021 1943   BILIRUBINUR negative 04/11/2018 1308   KETONESUR 15 (A) 06/19/2021 1943   PROTEINUR NEGATIVE 06/28/2021 1943   UROBILINOGEN 1.0 01/09/2021 1514   NITRITE NEGATIVE 07/06/2021 1943  LEUKOCYTESUR NEGATIVE 07/06/2021 1943   Sepsis Labs Invalid input(s): PROCALCITONIN,  WBC,  LACTICIDVEN Microbiology Recent Results (from the past 240 hour(s))  SARS CORONAVIRUS 2 (TAT 6-24 HRS) Nasopharyngeal Nasopharyngeal Swab     Status: None   Collection Time: 07/01/21  4:38 PM   Specimen: Nasopharyngeal Swab  Result Value Ref Range Status   SARS Coronavirus 2 NEGATIVE NEGATIVE Final    Comment: (NOTE) SARS-CoV-2 target nucleic acids are NOT DETECTED.  The SARS-CoV-2 RNA is generally detectable in upper and lower respiratory specimens during the acute phase of infection. Negative results do not preclude SARS-CoV-2 infection, do not rule out co-infections with other pathogens, and should not be used as the sole basis for treatment or other patient management decisions. Negative results must be combined with clinical observations, patient history, and epidemiological information. The expected result is Negative.  Fact Sheet for Patients: SugarRoll.be  Fact Sheet for Healthcare Providers: https://www.woods-mathews.com/  This test is not yet approved or cleared by the [place] FDA and  has been authorized for detection and/or diagnosis of SARS-CoV-2 by FDA under an Emergency Use Authorization (EUA). This EUA will remain  in effect (meaning this test can be used) for the duration of the COVID-19 declaration under Se ction 564(b)(1) of the Act, 21 U.S.C. section 360bbb-3(b)(1), unless the authorization is terminated or revoked sooner.  Performed at [facility], [place] [address], [place], [place] [address]     Please note: You were cared for by a hospitalist during your hospital stay. Once you are discharged, your primary care physician will handle any further medical issues. Please note that NO REFILLS for any discharge medications will be authorized once you are discharged, as it is imperative that you return to your primary care physician (or establish a relationship with a primary care physician if you do not have one) for your post hospital discharge needs so that they can reassess your need for medications and monitor your lab values.    Time coordinating discharge: 40 minutes  SIGNED:   Shelly Coss, MD  [facility] 07/03/2021, 10:25 AM Pager [phone]  If 7PM-7AM, please contact night-coverage [email or web address] Password [email or web address]

## 2021-07-02 NOTE — Plan of Care (Signed)
  Problem: Coping: Goal: Level of anxiety will decrease Outcome: Progressing   Problem: Pain Managment: Goal: General experience of comfort will improve Outcome: Progressing   Problem: Safety: Goal: Ability to remain free from injury will improve Outcome: Progressing   Problem: Skin Integrity: Goal: Risk for impaired skin integrity will decrease Outcome: Progressing   

## 2021-07-02 NOTE — Progress Notes (Signed)
Pt discharging today to [facility] today. Report called to the facility and Dawn Vohor LPN accepting report for this facility.

## 2021-07-03 ENCOUNTER — Ambulatory Visit: Payer: Medicare Other | Admitting: *Deleted

## 2021-07-03 DIAGNOSIS — M6281 Muscle weakness (generalized): Secondary | ICD-10-CM | POA: Diagnosis not present

## 2021-07-03 DIAGNOSIS — E119 Type 2 diabetes mellitus without complications: Secondary | ICD-10-CM | POA: Diagnosis not present

## 2021-07-03 DIAGNOSIS — D649 Anemia, unspecified: Secondary | ICD-10-CM | POA: Diagnosis not present

## 2021-07-03 DIAGNOSIS — M1990 Unspecified osteoarthritis, unspecified site: Secondary | ICD-10-CM | POA: Diagnosis not present

## 2021-07-03 DIAGNOSIS — F32A Depression, unspecified: Secondary | ICD-10-CM | POA: Diagnosis not present

## 2021-07-03 DIAGNOSIS — K279 Peptic ulcer, site unspecified, unspecified as acute or chronic, without hemorrhage or perforation: Secondary | ICD-10-CM | POA: Diagnosis not present

## 2021-07-03 DIAGNOSIS — I10 Essential (primary) hypertension: Secondary | ICD-10-CM | POA: Diagnosis not present

## 2021-07-03 DIAGNOSIS — N1831 Chronic kidney disease, stage 3a: Secondary | ICD-10-CM | POA: Diagnosis not present

## 2021-07-03 DIAGNOSIS — K219 Gastro-esophageal reflux disease without esophagitis: Secondary | ICD-10-CM | POA: Diagnosis not present

## 2021-07-03 DIAGNOSIS — E1165 Type 2 diabetes mellitus with hyperglycemia: Secondary | ICD-10-CM

## 2021-07-03 DIAGNOSIS — F329 Major depressive disorder, single episode, unspecified: Secondary | ICD-10-CM

## 2021-07-03 DIAGNOSIS — I2510 Atherosclerotic heart disease of native coronary artery without angina pectoris: Secondary | ICD-10-CM | POA: Diagnosis not present

## 2021-07-03 DIAGNOSIS — M549 Dorsalgia, unspecified: Secondary | ICD-10-CM | POA: Diagnosis not present

## 2021-07-03 DIAGNOSIS — R4189 Other symptoms and signs involving cognitive functions and awareness: Secondary | ICD-10-CM | POA: Diagnosis not present
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
## 2021-07-03 NOTE — Patient Instructions (Signed)
Visit Information  Chrystal, it was nice talking with you today about your mother [name]'s health care needs.   I will be staying in touch with the [place] about Amberlyn's progress at [facility]; I look forward to hearing about her progress.   Please don't hesitate to contact me if I can be of assistance to you in the mean time   Oneta Rack, RN, BSN, [place] [phone]: direct office [phone]: mobile   PATIENT GOALS:  Goals Addressed                        Patient Self-Care Activities   On track    Timeframe:  Long-Range Goal Priority:  Medium Start Date:         07/03/21                    Expected End Date:      07/03/21                 Patient will attend all scheduled provider appointments as evidenced by clinician review of documented attendance to scheduled appointments and patient/caregiver report Patient/ caregiver/ daughter will call provider office for new concerns or questions as evidenced by review of documented incoming telephone call notes and patient report Patient/ caregiver will maintain communication with CCM care management team around updates and needs, pending SNF-rehabilitation discharge         Patient's daughter/ caregiver verbalizes understanding of instructions provided today and agrees to view in [place] The patient has been provided with contact information for the care management team and has been advised to call with any health related questions or concerns  Oneta Rack, RN, BSN, [place] [phone]: direct office [phone]: mobile

## 2021-07-04 DIAGNOSIS — I10 Essential (primary) hypertension: Secondary | ICD-10-CM | POA: Diagnosis not present

## 2021-07-05 ENCOUNTER — Ambulatory Visit: Payer: Medicare Other | Admitting: *Deleted

## 2021-07-05 DIAGNOSIS — E119 Type 2 diabetes mellitus without complications: Secondary | ICD-10-CM

## 2021-07-05 DIAGNOSIS — I10 Essential (primary) hypertension: Secondary | ICD-10-CM
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
## 2021-07-06 DIAGNOSIS — I152 Hypertension secondary to endocrine disorders: Secondary | ICD-10-CM | POA: Diagnosis not present

## 2021-07-06 DIAGNOSIS — E1165 Type 2 diabetes mellitus with hyperglycemia: Secondary | ICD-10-CM | POA: Diagnosis not present

## 2021-07-06 DIAGNOSIS — D62 Acute posthemorrhagic anemia: Secondary | ICD-10-CM | POA: Diagnosis not present

## 2021-07-06 DIAGNOSIS — G9340 Encephalopathy, unspecified: Secondary | ICD-10-CM | POA: Diagnosis not present

## 2021-07-06 DIAGNOSIS — I10 Essential (primary) hypertension: Secondary | ICD-10-CM | POA: Diagnosis not present

## 2021-07-06 DIAGNOSIS — E11649 Type 2 diabetes mellitus with hypoglycemia without coma: Secondary | ICD-10-CM | POA: Diagnosis not present

## 2021-07-06 DIAGNOSIS — R531 Weakness: Secondary | ICD-10-CM | POA: Diagnosis not present

## 2021-07-06 DIAGNOSIS — E1159 Type 2 diabetes mellitus with other circulatory complications: Secondary | ICD-10-CM | POA: Diagnosis not present

## 2021-07-06 DIAGNOSIS — E119 Type 2 diabetes mellitus without complications: Secondary | ICD-10-CM

## 2021-07-06 DIAGNOSIS — F329 Major depressive disorder, single episode, unspecified: Secondary | ICD-10-CM

## 2021-07-06 DIAGNOSIS — Z794 Long term (current) use of insulin: Secondary | ICD-10-CM

## 2021-07-07 DIAGNOSIS — 419620001 Death: Secondary | SNOMED CT | POA: Diagnosis not present

## 2021-07-07 DEATH — deceased

## 2021-07-09 NOTE — Telephone Encounter (Signed)
Need to know what she has in mind for an end date

## 2021-07-09 NOTE — Telephone Encounter (Signed)
Ms. [name] called back to inform us to make the recent forms provided today to continuous leave.

## 2021-07-09 NOTE — Telephone Encounter (Signed)
Crystal has called to confirm receipt of forms via fax.   She is requesting documentation to proof Mother Sundee Alkire) needs her full attention in between nurses coming and for doctors appointments.  Please complete and fill form out fax back to HR and keep a copy for Mrs. Donella Stade.  Forms Have been printed and place in Dr. Judi Cong Box.  Call back for Crystal is [phone]

## 2021-07-10 DIAGNOSIS — S72451D Displaced supracondylar fracture without intracondylar extension of lower end of right femur, subsequent encounter for closed fracture with routine healing: Secondary | ICD-10-CM | POA: Diagnosis not present

## 2021-07-10 NOTE — Telephone Encounter (Signed)
The end date is 12 months from the start date.

## 2021-07-11 ENCOUNTER — Other Ambulatory Visit: Payer: Self-pay | Admitting: *Deleted

## 2021-07-11 ENCOUNTER — Institutional Professional Consult (permissible substitution): Payer: Medicare Other | Admitting: Diagnostic Neuroimaging

## 2021-07-11 DIAGNOSIS — Z794 Long term (current) use of insulin: Secondary | ICD-10-CM | POA: Diagnosis not present

## 2021-07-11 DIAGNOSIS — E039 Hypothyroidism, unspecified: Secondary | ICD-10-CM | POA: Diagnosis not present

## 2021-07-11 DIAGNOSIS — Z23 Encounter for immunization: Secondary | ICD-10-CM | POA: Diagnosis not present

## 2021-07-11 DIAGNOSIS — E1165 Type 2 diabetes mellitus with hyperglycemia: Secondary | ICD-10-CM | POA: Diagnosis not present

## 2021-07-11 NOTE — Patient Outreach (Signed)
[name] resides in [facility] SNF. She has been followed by [place] team with PCP office.   Telephone call made to daughter/DPR [name] [phone] to discuss transition plans and member's progress. Patient identifiers confirmed.   [name] states she is walking into [facility] at the time of writer's call. States transition plan is for member to return home with her. States she is not sure when [name] will be released from SNF. Encouraged [name] to speak with SNF SW and/or therapy regarding member's progress. [name] states she is not aware of a scheduled care plan meeting as of yet. Encouraged her to see when meeting is planned.   Conversation brief since [name] was visiting her mother in [place]. Discussed that writer will contact [name] on this Friday to discuss transition plans and the like.    Will continue to follow while member resides in SNF.   [name], MSN, RN,BSN [place] Acute Care Coordinator [phone] [facility] [phone]  (Toll free office)

## 2021-07-12 ENCOUNTER — Other Ambulatory Visit: Payer: Self-pay | Admitting: *Deleted

## 2021-07-12 NOTE — Patient Outreach (Signed)
[place] Coordinator follow up. Mrs. Filion remains in [facility] SNF.  Communication sent to [facility] SNF SW to make aware writer is following for [facility] care coordination services.   Will continue to follow while member resides in SNF.   [name], MSN, RN,BSN [place] Acute Care Coordinator [phone] [facility]) [phone]  (Toll free office)

## 2021-07-12 NOTE — Telephone Encounter (Signed)
Ok done hardcopy to [employer] station
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
## 2021-07-13 DIAGNOSIS — E039 Hypothyroidism, unspecified: Secondary | ICD-10-CM | POA: Diagnosis not present

## 2021-07-13 DIAGNOSIS — I10 Essential (primary) hypertension: Secondary | ICD-10-CM | POA: Diagnosis not present

## 2021-07-13 DIAGNOSIS — R531 Weakness: Secondary | ICD-10-CM | POA: Diagnosis not present

## 2021-07-13 DIAGNOSIS — D62 Acute posthemorrhagic anemia: Secondary | ICD-10-CM | POA: Diagnosis not present

## 2021-07-13 NOTE — Telephone Encounter (Signed)
Forms faxed. Unable to reach daughter

## 2021-07-17 ENCOUNTER — Telehealth: Payer: Self-pay | Admitting: Podiatry
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
## 2021-07-18 ENCOUNTER — Telehealth: Payer: Self-pay | Admitting: Podiatry

## 2021-07-18 ENCOUNTER — Telehealth: Payer: Self-pay | Admitting: Internal Medicine

## 2021-07-18 NOTE — Telephone Encounter (Signed)
Type of form received- FMLA   Form placed in- Provider mailbox  Additional instructions from the patient- Notify by phone when complete- [phone]  Reminded patient that forms take 7-10 business days

## 2021-07-20 ENCOUNTER — Other Ambulatory Visit: Payer: Self-pay

## 2021-07-20 ENCOUNTER — Other Ambulatory Visit: Payer: Self-pay | Admitting: *Deleted

## 2021-07-20 ENCOUNTER — Encounter: Payer: Self-pay | Admitting: Neurology

## 2021-07-20 ENCOUNTER — Ambulatory Visit (INDEPENDENT_AMBULATORY_CARE_PROVIDER_SITE_OTHER): Payer: Medicare Other | Admitting: Neurology

## 2021-07-20 VITALS — BP 116/63 | HR 92 | Resp 20

## 2021-07-20 DIAGNOSIS — G629 Polyneuropathy, unspecified: Secondary | ICD-10-CM

## 2021-07-20 DIAGNOSIS — R531 Weakness: Secondary | ICD-10-CM

## 2021-07-20 NOTE — Progress Notes (Signed)
[name] - Initial Visit   Date: 07/20/21  [name] MRN: [id number] DOB: [birth year]   Dear Dr. Jenny Reichmann:  Thank you for your kind referral of [name] for consultation of hand pain. Although her history is well known to you, please allow [place] to reiterate it for the purpose of our medical record. The patient was accompanied to the clinic by self.  History of Present Illness: [name] is a 70 y.o. female with insulin-diabetes mellitus, CKD, depression, anxiety, GERD, hypothyroidism anxiety, VAD, hypothyroidism, chronic back pain s/p spinal cord stimulator presenting for evaluation of bilateral hand pain. Patient was living at home until July when she suffered a fall resulting in left femur fracture requiring ORIF. She was readmitted to [name] with progressive weakness of the arms and legs.  CT cervical spine, CT head, and CT lumbar spine did not demonstrate structural pathology to explain her symptoms.   She is unable to get MRI due to spinal cord stimulator.  She has ongoing bilateral hand pain and weakness, described as achy and throbbing pain and tingling.  Daughter has noticed that her hands and feet curl down and she has weakness throughout.  She was walking with a cane in July and now non-ambulatory.  She is in wheelchair today.  Much of today's history was obtained from daughter and review of medical chart.  Patient does not engage in the visit.   Out-side paper records, electronic medical record, and images have been reviewed where available and summarized as:  CT head 04/18/2021:  Normal CT lumbar spine 04/15/2021: 1. No acute finding. 2. L3-L5 solid arthrodesis. 3. 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  CT cervical spine 04/14/2021: 1. No acute intracranial pathology. Mild age-related atrophy and chronic microvascular ischemic changes. 2. No acute/traumatic cervical spine pathology. Multilevel degenerative  changes. NCS/EMG of the legs 11/21/2016: The electrophysiologic findings are most consistent with a chronic sensorimotor polyneuropathy, axon loss in type, affecting the lower extremities.  There is no definite evidence of a lumbosacral radiculopathy, however due to technical limitations due to body physiognomy, proximal muscle groups were unable to be sampled. Clinical correlation is recommended.  Lab Results  Component Value Date   HGBA1C 7.0 (H) 04/15/2021   Lab Results  Component Value Date   VITAMINB12 1,515 (H) 05/18/2021   Lab Results  Component Value Date   TSH 36.944 (H) 05/18/2021   Lab Results  Component Value Date   ESRSEDRATE 109 (H) 04/15/2021   Lab Results  Component Value Date   CKTOTAL 111 07/03/2021     Past Medical History:  Diagnosis Date   Anxiety    Arthritis    Blood dyscrasia    "FREE BLEEDER"   CERVICAL RADICULOPATHY, LEFT    Chronic back pain    CKD (chronic kidney disease)    DR. SANFORD  [facility]   COMMON MIGRAINE    CORONARY ARTERY DISEASE    Cough    CURRENT COLD   Cystitis    DEPRESSION    Diabetes mellitus, type II (HCC)    DIVERTICULOSIS-COLON    Dysrhythmia    palpitations   Gastric ulcer 04/2008   Gastroparesis    GERD (gastroesophageal reflux disease)    Hiatal hernia    Hyperlipidemia    Hypertension    Hypothyroidism    INSOMNIA-SLEEP DISORDER-UNSPEC    Iron deficiency anemia    Wears glasses     Past Surgical History:  Procedure Laterality Date  ABDOMINAL HYSTERECTOMY     ANTERIOR LAT LUMBAR FUSION Left 08/13/2017   Procedure: LEFT SIDED LUMBAR 3-4 LATERAL INTERBODY FUSION WITH INSTRUMENTATION AND ALLOGRAFT;  Surgeon: Phylliss Bob, MD;  Location: [facility];  Service: Orthopedics;  Laterality: Left;  LEFT SIDED LUMBAR 3-4 LATERAL INTERBODY FUSION WITH INSTRUMENTATION AND ALLOGRAFT; REQUEST 3 HOURS   Back Stimulator  07/2018   BACK SURGERY  03/2016   BIOPSY  06/28/2021   Procedure: BIOPSY;  Surgeon: Jerene Bears, MD;  Location: [facility];  Service: Gastroenterology;;   BREAST EXCISIONAL BIOPSY Right    40 years ago   CHOLECYSTECTOMY  06/2009   COLONOSCOPY     ESOPHAGOGASTRODUODENOSCOPY     ESOPHAGOGASTRODUODENOSCOPY (EGD) WITH PROPOFOL N/A 06/28/2021   Procedure: ESOPHAGOGASTRODUODENOSCOPY (EGD) WITH PROPOFOL;  Surgeon: Jerene Bears, MD;  Location: [facility];  Service: Gastroenterology;  Laterality: N/A;   EYE SURGERY Bilateral    lasik   Gastric Wedge resection lipoma  11/2007   x2 with laparotomy and gastrostomy   JOINT REPLACEMENT     Left knee replacement     ORIF FEMUR FRACTURE Right 04/16/2021   Procedure: OPEN REDUCTION INTERNAL FIXATION (ORIF) DISTAL FEMUR FRACTURE;  Surgeon: Shona Needles, MD;  Location: [facility];  Service: Orthopedics;  Laterality: Right;   Rigth knee replacement with revision  04/2008   Dr. Berenice Primas   ROTATOR CUFF REPAIR Left 01/2009   s/p bladder surgury  09/2009   Dr. Terance Hart   SPINAL CORD STIMULATOR INSERTION N/A 09/11/2018   Procedure: LUMBAR SPINAL CORD STIMULATOR INSERTION;  Surgeon: Clydell Hakim, MD;  Location: [facility];  Service: Neurosurgery;  Laterality: N/A;  LUMBAR SPINAL CORD STIMULATOR INSERTION     Medications:  Outpatient Encounter Medications as of 07/20/2021  Medication Sig [facility]   amLODipine (NORVASC) 10 MG tablet Take 1 tablet (10 mg total) by mouth daily.    atorvastatin (LIPITOR) 40 MG tablet TAKE ONE TABLET BY MOUTH EVERY NIGHT AT BEDTIME    Continuous Blood Gluc Receiver (FREESTYLE LIBRE 2 READER) DEVI Use as directed twice daily E11.9    Continuous Blood Gluc Sensor (FREESTYLE LIBRE 2 SENSOR) MISC Use as directed once bi-weekly E11.9    docusate sodium (COLACE) 100 MG capsule Take 1 capsule (100 mg total) by mouth 2 (two) times daily as needed for mild constipation.    fexofenadine (ALLEGRA) 180 MG tablet Take 1 tablet (180 mg total) by mouth daily.    gabapentin (NEURONTIN) 300 MG capsule Take 300 mg by mouth 3 (three) times daily.     glucose blood (FREESTYLE TEST STRIPS) test strip Use as instructed to check blood sugar 4 times daily.    glucose blood test strip Use as instructed    insulin glargine-yfgn (SEMGLEE) 100 UNIT/ML injection Inject 0.1 mLs (10 Units total) into the skin daily.    insulin lispro (HUMALOG) 100 UNIT/ML injection Inject 3-20 Units into the skin See admin instructions. Per sliding scale 3 times daily    isosorbide mononitrate (IMDUR) 60 MG 24 hr tablet Take 1 tablet (60 mg total) by mouth daily.    levothyroxine (SYNTHROID) 75 MCG tablet Take 75 mcg by mouth daily.    linaclotide (LINZESS) 72 MCG capsule Take 1 capsule (72 mcg total) by mouth daily before breakfast.    losartan-hydrochlorothiazide (HYZAAR) 100-25 MG tablet Take 1 tablet by mouth daily.    megestrol (MEGACE) 400 MG/10ML suspension Take 10 mLs (400 mg total) by mouth daily.    metoprolol succinate (TOPROL-XL) 50 MG 24  hr tablet TAKE 1 TABLET BY MOUTH DAILY WITH OR IMMEDIATELY FOLLOWING A MEAL (Patient taking differently: Take 50 mg by mouth daily.) 06/21/2021: Unk time   nitroGLYCERIN (NITROSTAT) 0.4 MG SL tablet Place 1 tablet (0.4 mg total) under the tongue every 5 (five) minutes as needed for chest pain.    ondansetron (ZOFRAN ODT) 8 MG disintegrating tablet '8mg'$  ODT q4 hours prn nausea (Patient taking differently: Take 8 mg by mouth every 4 (four) hours as needed for nausea.)    pantoprazole (PROTONIX) 40 MG tablet TAKE 1 TABLET(40 MG) BY MOUTH TWICE DAILY (Patient taking differently: Take 40 mg by mouth 2 (two) times daily. TAKE 1 TABLET(40 MG) BY MOUTH TWICE DAILY)    polyethylene glycol (MIRALAX / GLYCOLAX) 17 g packet Take 17 g by mouth daily.    traMADol (ULTRAM) 50 MG tablet Take 1 tablet (50 mg total) by mouth every 6 (six) hours as needed.    Vitamin D, Ergocalciferol, (DRISDOL) 1.25 MG (50000 UNIT) CAPS capsule Take 1 capsule (50,000 Units total) by mouth every 7 (seven) days.    ELMIRON 100 MG capsule Take 200 mg by mouth 2 (two)  times daily.  (Patient not taking: Reported on 07/20/2021) 05/15/2014: .   FLUoxetine (PROZAC) 20 MG capsule TAKE ONE CAPSULE BY MOUTH DAILY (Patient not taking: Reported on 07/20/2021)    levothyroxine (SYNTHROID) 50 MCG tablet Take 1 tablet (50 mcg total) by mouth daily at 6 (six) AM. (Patient not taking: Reported on 07/20/2021)    [DISCONTINUED] enoxaparin (LOVENOX) 40 MG/0.4ML injection Inject 0.4 mLs (40 mg total) into the skin daily.    No facility-administered encounter medications on file as of 07/20/2021.    Allergies:  Allergies  Allergen Reactions   Ciprofloxacin Shortness Of Breath and Other (See Comments)    Dizziness    Hydrocodone Shortness Of Breath and Other (See Comments)    Tachycardia   Hydrocortisone    Strawberry (Diagnostic) Other (See Comments)    Abdominal pain   Strawberry Extract Other (See Comments)    Abdominal pain   Tomato Other (See Comments)    Abdominal pain   Penicillins Hives    Pt just remembers hives, SHE HAS HAD AUGEMTIN WITHOUT ISSUES PER PATIENT no SOB or lightheadedness with hypotension: No Has patient had a PCN reaction causing severe rash involving mucus membranes or skin necrosis: No Has patient had a PCN reaction that required hospitalization No Has patient had a PCN reaction occurring within the last 10 years: No If all of the above answers are "NO", then may proceed with Cephalosporin use.     Family History: Family History  Problem Relation Age of Onset   Diabetes Sister        x 3   Heart disease Sister        x2   Breast cancer Sister 72   Diabetes Brother        x3   Heart disease Brother        x2   Coronary artery disease Other        female 1st degree   Hypertension Other    Breast cancer Sister 61   Breast cancer Other 25   Diabetes Brother    Colon cancer Neg Hx     Social History: Social History   Tobacco Use   Smoking status: Former   Smokeless tobacco: Never   Tobacco comments:    quit 30 years ago   Vaping Use   Vaping Use: Never  used  Substance Use Topics   Alcohol use: No    Alcohol/week: 0.0 standard drinks   Drug use: No   Social History   Social History Narrative   Patient gets regular exercise   Lives with husband   Right handed   Caffeine- tea, 2 glasses a day, sodas 2-3 daily    Vital Signs:  BP 116/63   Pulse 92   Resp 20   SpO2 100%     Neurological Exam: MENTAL STATUS including orientation to time, place, person, recent and remote memory, attention span and concentration, language, and fund of knowledge is fair.  Patient does not answer questions, daughter tends to speak for her.  Paucity of speech.  No dysarthria.  CRANIAL NERVES: II:  No visual field defects.  III-IV-VI: Pupils equal round and reactive to light.  Normal conjugate, extra-ocular eye movements in all directions of gaze.  No nystagmus.  No ptosis.   V:  Normal facial sensation.    VII:  Normal facial symmetry and movements.   VIII:  Normal hearing and vestibular function.   IX-X:  Normal palatal movement.   XI:  Normal shoulder shrug and head rotation.   XII:  Normal tongue strength and range of motion, no deviation or fasciculation.  MOTOR:  Motor exam is very challenging due to give-way weakness.  She actively will not raise the arms, but when I passively raise the arm, she is able to keep the arm raised and has full strength with deltoid, biceps, and triceps testing bilaterally.  There is poor effort with wrist extension and finger movements.  She prefers to keep the fingers in a flexed position.  Tone is increased in the arms. In the legs, there is no antigravity movements 2/5 proximally and distally. No atrophy, fasciculations or abnormal movements.  No pronator drift.   MSRs:  Right        Left                  brachioradialis 2+  2+  biceps 2+  2+  triceps 2+  2+  patellar 0  0  ankle jerk 0  0  Hoffman no  no  plantar response down  down   SENSORY:  Vibration is intact at the  hand and knees, absent at the ankles.  Temperature and pin prick intact in the arms.   COORDINATION/GAIT: Finger tapping is slowed and amplitude is reduced.  Gait is not assessed, patient wheelchair bound   IMPRESSION: Progressive weakness of the hands, unclear etiology. Exam is very difficult to accurately assess the degree of weakness.  Her reflexes are brisk in the arm and tone is also increased, which I would not expect to see with neuropathy.  However, there is no evidence of spinal canal stenosis on her CT cervical spine from July.  She is unable to get MRI due to spinal cord stimulator.  I am not sure how much of her weakness due to lack of motivation since she was able to give full strength about repeated encouragement and testing in the upper arms.  I offered NCS/EMG of the arms, keeping in mind that she will not be able to get on the exam table to do the testing.  I will try doing this with her in the wheelchair, but it will be technically challenging and results may be limited.   Diffuse leg weakness - ?progressive neuropathy vs deconditioning.  It will not be possible to perform EMG in a wheelchair.  Prior testing from 2018 was consistent with neuropathy.  CK is normal making myopathy unlikely. She does have moderate canal stenosis at L2-3, which would not manifesting with the severity of weakness that she has.  Continue supportive care.  Further recommendations pending results.  Total time spent reviewing records, interview, history/exam, documentation, and coordination of care on day of encounter:  70 min   Thank you for allowing me to participate in patient's care.  If I can answer any additional questions, I would be pleased to do so.    Sincerely,    Glora Hulgan K. Posey Pronto, DO
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
## 2021-07-20 NOTE — Addendum Note (Signed)
Addended by: Terence Lux A on: 07/20/2021 03:33 PM   Modules accepted: Orders

## 2021-07-23 NOTE — Telephone Encounter (Signed)
x

## 2021-07-25 DIAGNOSIS — F32A Depression, unspecified: Secondary | ICD-10-CM | POA: Diagnosis not present

## 2021-07-25 DIAGNOSIS — F419 Anxiety disorder, unspecified: Secondary | ICD-10-CM | POA: Diagnosis not present

## 2021-07-25 DIAGNOSIS — I10 Essential (primary) hypertension: Secondary | ICD-10-CM | POA: Diagnosis not present

## 2021-07-25 DIAGNOSIS — M6281 Muscle weakness (generalized): Secondary | ICD-10-CM | POA: Diagnosis not present

## 2021-07-26 NOTE — Telephone Encounter (Signed)
Paperwork re-faxed and patient's daughter notified.

## 2021-07-27 DIAGNOSIS — I10 Essential (primary) hypertension: Secondary | ICD-10-CM | POA: Diagnosis not present

## 2021-07-27 DIAGNOSIS — E119 Type 2 diabetes mellitus without complications: Secondary | ICD-10-CM | POA: Diagnosis not present

## 2021-07-27 DIAGNOSIS — F419 Anxiety disorder, unspecified: Secondary | ICD-10-CM | POA: Diagnosis not present

## 2021-07-27 DIAGNOSIS — F32A Depression, unspecified: Secondary | ICD-10-CM | POA: Diagnosis not present

## 2021-07-27 DIAGNOSIS — W19XXXD Unspecified fall, subsequent encounter: Secondary | ICD-10-CM | POA: Diagnosis not present

## 2021-07-29 DIAGNOSIS — R4189 Other symptoms and signs involving cognitive functions and awareness: Secondary | ICD-10-CM | POA: Diagnosis not present

## 2021-07-29 DIAGNOSIS — F32A Depression, unspecified: Secondary | ICD-10-CM | POA: Diagnosis not present

## 2021-07-29 DIAGNOSIS — M25531 Pain in right wrist: Secondary | ICD-10-CM | POA: Diagnosis not present

## 2021-07-29 DIAGNOSIS — K219 Gastro-esophageal reflux disease without esophagitis: Secondary | ICD-10-CM | POA: Diagnosis not present

## 2021-07-29 DIAGNOSIS — I2510 Atherosclerotic heart disease of native coronary artery without angina pectoris: Secondary | ICD-10-CM | POA: Diagnosis not present

## 2021-07-29 DIAGNOSIS — F419 Anxiety disorder, unspecified: Secondary | ICD-10-CM | POA: Diagnosis not present

## 2021-07-29 DIAGNOSIS — M1990 Unspecified osteoarthritis, unspecified site: Secondary | ICD-10-CM | POA: Diagnosis not present

## 2021-07-29 DIAGNOSIS — K5900 Constipation, unspecified: Secondary | ICD-10-CM | POA: Diagnosis not present

## 2021-07-29 DIAGNOSIS — M549 Dorsalgia, unspecified: Secondary | ICD-10-CM | POA: Diagnosis not present

## 2021-07-29 DIAGNOSIS — M6281 Muscle weakness (generalized): Secondary | ICD-10-CM | POA: Diagnosis not present

## 2021-07-31 DIAGNOSIS — F331 Major depressive disorder, recurrent, moderate: Secondary | ICD-10-CM | POA: Diagnosis not present

## 2021-07-31 DIAGNOSIS — F419 Anxiety disorder, unspecified: Secondary | ICD-10-CM | POA: Diagnosis not present

## 2021-08-03 ENCOUNTER — Other Ambulatory Visit: Payer: Self-pay

## 2021-08-03 ENCOUNTER — Encounter (HOSPITAL_BASED_OUTPATIENT_CLINIC_OR_DEPARTMENT_OTHER): Payer: Self-pay | Admitting: *Deleted

## 2021-08-03 ENCOUNTER — Ambulatory Visit (HOSPITAL_COMMUNITY)
Admission: EM | Admit: 2021-08-03 | Discharge: 2021-08-03 | Disposition: A | Discharge status: Home / Self Care | Payer: Medicare Other | Attending: Emergency Medicine | Admitting: Emergency Medicine

## 2021-08-03 ENCOUNTER — Emergency Department (HOSPITAL_BASED_OUTPATIENT_CLINIC_OR_DEPARTMENT_OTHER)
Admission: EM | Admit: 2021-08-03 | Discharge: 2021-08-04 | Disposition: A | Discharge status: Home / Self Care | Payer: Medicare Other | Attending: Emergency Medicine | Admitting: Emergency Medicine

## 2021-08-03 ENCOUNTER — Emergency Department (HOSPITAL_BASED_OUTPATIENT_CLINIC_OR_DEPARTMENT_OTHER): Payer: Medicare Other

## 2021-08-03 ENCOUNTER — Encounter (HOSPITAL_COMMUNITY): Payer: Self-pay | Admitting: Emergency Medicine

## 2021-08-03 DIAGNOSIS — Z79899 Other long term (current) drug therapy: Secondary | ICD-10-CM | POA: Insufficient documentation

## 2021-08-03 DIAGNOSIS — E1122 Type 2 diabetes mellitus with diabetic chronic kidney disease: Secondary | ICD-10-CM | POA: Diagnosis not present

## 2021-08-03 DIAGNOSIS — Z7901 Long term (current) use of anticoagulants: Secondary | ICD-10-CM | POA: Insufficient documentation

## 2021-08-03 DIAGNOSIS — M7989 Other specified soft tissue disorders: Secondary | ICD-10-CM | POA: Diagnosis not present

## 2021-08-03 DIAGNOSIS — N183 Chronic kidney disease, stage 3 unspecified: Secondary | ICD-10-CM | POA: Diagnosis not present

## 2021-08-03 DIAGNOSIS — Z9622 Myringotomy tube(s) status: Secondary | ICD-10-CM | POA: Diagnosis not present

## 2021-08-03 DIAGNOSIS — R109 Unspecified abdominal pain: Secondary | ICD-10-CM

## 2021-08-03 DIAGNOSIS — R000 Tachycardia, unspecified: Secondary | ICD-10-CM | POA: Diagnosis not present

## 2021-08-03 DIAGNOSIS — M25531 Pain in right wrist: Secondary | ICD-10-CM | POA: Insufficient documentation

## 2021-08-03 DIAGNOSIS — Z96652 Presence of left artificial knee joint: Secondary | ICD-10-CM | POA: Diagnosis not present

## 2021-08-03 DIAGNOSIS — Z87891 Personal history of nicotine dependence: Secondary | ICD-10-CM | POA: Insufficient documentation

## 2021-08-03 DIAGNOSIS — I2510 Atherosclerotic heart disease of native coronary artery without angina pectoris: Secondary | ICD-10-CM | POA: Diagnosis not present

## 2021-08-03 DIAGNOSIS — Z7984 Long term (current) use of oral hypoglycemic drugs: Secondary | ICD-10-CM | POA: Insufficient documentation

## 2021-08-03 DIAGNOSIS — W19XXXA Unspecified fall, initial encounter: Secondary | ICD-10-CM

## 2021-08-03 DIAGNOSIS — I1310 Hypertensive heart and chronic kidney disease without heart failure, with stage 1 through stage 4 chronic kidney disease, or unspecified chronic kidney disease: Secondary | ICD-10-CM | POA: Insufficient documentation

## 2021-08-03 DIAGNOSIS — E039 Hypothyroidism, unspecified: Secondary | ICD-10-CM | POA: Insufficient documentation

## 2021-08-03 LAB — CBG MONITORING, ED: Glucose-Capillary: 208 mg/dL — ABNORMAL HIGH (ref 70–99)

## 2021-08-03 MED ORDER — ATORVASTATIN CALCIUM 40 MG PO TABS
40.0000 mg | ORAL_TABLET | Freq: Every day | ORAL | Status: DC
  Administered 2021-08-03: 40 mg via ORAL
  Filled 2021-08-03: qty 1

## 2021-08-03 MED ORDER — TRAMADOL HCL 50 MG PO TABS
50.0000 mg | ORAL_TABLET | Freq: Four times a day (QID) | ORAL | Status: DC
  Administered 2021-08-03: 50 mg via ORAL
  Filled 2021-08-03: qty 1

## 2021-08-03 MED ORDER — INSULIN GLARGINE-YFGN 100 UNIT/ML ~~LOC~~ SOLN
10.0000 [IU] | Freq: Every day | SUBCUTANEOUS | Status: DC
  Administered 2021-08-03: 10 [IU] via SUBCUTANEOUS
  Filled 2021-08-03: qty 10

## 2021-08-03 MED ORDER — PANTOPRAZOLE SODIUM 40 MG PO TBEC
40.0000 mg | DELAYED_RELEASE_TABLET | Freq: Every day | ORAL | Status: DC
  Administered 2021-08-03: 40 mg via ORAL
  Filled 2021-08-03: qty 1

## 2021-08-03 NOTE — ED Provider Notes (Addendum)
[facility] EMERGENCY DEPARTMENT Provider Note   CSN: [id number] Arrival date & time: 08/03/21  1633     History Chief Complaint  Patient presents with   [name] is a 70 y.o. female presenting from skilled nursing facility with concern for right wrist pain.  Supplemental history provided by her daughter at bedside.  Her daughter reports that the patient reportedly had fallen out of bed several days ago, approximately 1 week ago.  Her only persistent symptom since then has been complained about pain in her right wrist.  There is no reported loss of consciousness.  She is not on blood thinners.  She does take tramadol for pain.  Patient was seen in urgent care today, which point she was complaining about abdominal pain, then referred into the ED for further evaluation.  Here in the emergency department she does not have any active abdominal pain.  She has no GI complaints.  Her daughter reports patient has a sensitive stomach and has been given tomatoes earlier.  Per her most recent upper endoscopy 1 month ago, the patient did have moderate gastritis.  HPI     Past Medical History:  Diagnosis Date   Anxiety    Arthritis    Blood dyscrasia    "FREE BLEEDER"   CERVICAL RADICULOPATHY, LEFT    Chronic back pain    CKD (chronic kidney disease)    DR. SANFORD   KIDNEY   COMMON MIGRAINE    CORONARY ARTERY DISEASE    Cough    CURRENT COLD   Cystitis    DEPRESSION    Diabetes mellitus, type II (HCC)    DIVERTICULOSIS-COLON    Dysrhythmia    palpitations   Gastric ulcer 04/2008   Gastroparesis    GERD (gastroesophageal reflux disease)    Hiatal hernia    Hyperlipidemia    Hypertension    Hypothyroidism    INSOMNIA-SLEEP DISORDER-UNSPEC    Iron deficiency anemia    Wears glasses     Patient Active Problem List   Diagnosis Date Noted   Acute gastritis    Heme positive stool    Tachycardia 07/06/2021   Lactic acidosis 06/07/2021    Cognitive disorder 05/21/2021   Generalized weakness 05/17/2021   Femur fracture ([place]) 05/01/2021   Bilateral hand pain 05/01/2021   Weight loss 04/15/2021   Leukocytosis 04/15/2021   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   Acute diverticulitis 03/21/2021   Degenerative disc disease, cervical 02/08/2021   Type 2 diabetes mellitus ([place]) 02/02/2021   Acquired hypothyroidism 11/24/2020   Chronic pain 11/24/2020   Severe sepsis ([place]) 11/09/2020   Hyperlipidemia associated with type 2 diabetes mellitus ([place]) 18/29/9371   Acute metabolic encephalopathy 69/67/8938   Acute lower UTI 11/09/2020   Recurrent falls 10/19/2020   Gait disorder 10/11/2020   Fall 10/11/2020   Low grade fever 02/04/2020   Chest pain 01/26/2020   Lumbar post-laminectomy syndrome 10/05/2019   Spinal cord stimulator status 10/05/2019   Body mass index (BMI) 40.0-44.9, adult ([place]) 10/05/2019   Rectal bleeding 09/01/2019   History of peptic ulcer disease 09/01/2019   Screening for viral disease 09/01/2019   Insulin dependent type 2 diabetes mellitus ([place]) 09/01/2019   Pyelonephritis 06/05/2019   Recurrent UTI 06/05/2019   Urinary incontinence 06/05/2019   Nausea & vomiting 02/08/2019   Abdominal pain 02/08/2019   Constipation 02/08/2019   UTI (urinary tract infection) 02/08/2019   LUQ pain 01/21/2019   Cervical radiculopathy at C6 01/28/2018  Left lateral epicondylitis 01/23/2018   Obesity 01/23/2018   Weakness 05/01/2017   Hyperkalemia 05/01/2017   Acute kidney injury superimposed on CKD ([place]) 05/01/2017   Essential (primary) hypertension 05/01/2017   Sepsis ([place]) 05/01/2017   Dysuria 04/23/2017   Right leg swelling 12/27/2016   Leg pain, lateral, right 11/05/2016   Radiculopathy 04/03/2016   Hypersomnolence 11/23/2015   Right sided abdominal pain 07/25/2015   Malaise and fatigue 11/05/2014   Rotator cuff syndrome 07/26/2014   Fracture of fifth metacarpal bone of right hand 07/08/2014   Right cervical  radiculopathy 06/27/2014   Chronic interstitial cystitis 05/23/2014   Osteoarthritis of CMC joint of thumb 04/11/2014   De Quervain's tenosynovitis, left 03/14/2014   CKD (chronic kidney disease), stage III ([place]) 03/03/2014   Lower back pain 03/03/2014   Ingrown nail 03/03/2014   Peripheral edema 02/23/2014   Lateral epicondylitis of right elbow 01/31/2014   Neck pain on left side 11/19/2013   Abdominal discomfort 10/12/2012   Right lumbar radiculopathy 05/11/2012   Peripheral neuropathy 05/11/2012   Vertigo 03/29/2012   Headache(784.0) 03/10/2012   Left lower quadrant abdominal pain 10/24/2011   Lumbar radiculopathy 10/24/2011   Hypothyroidism 08/19/2011   Right leg weakness 04/04/2011   Hematochezia 01/07/2011   Preventative health care 01/07/2011   CHOLELITHIASIS 05/04/2009   GERD 04/26/2009   Gastroparesis 04/26/2009   ULCER-GASTRIC 03/15/2009   DIVERTICULOSIS-COLON 03/15/2009   Cervical radiculopathy 10/28/2008   MENOPAUSAL DISORDER 06/29/2008   Pain in joint, shoulder region 10/12/2007   INSOMNIA-SLEEP DISORDER-UNSPEC 09/01/2007   Depression 09/01/2007   Anemia 08/13/2007   COMMON MIGRAINE 08/13/2007   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   Diabetes ([place]) 05/06/2007   HLD (hyperlipidemia) 05/06/2007   ANXIETY 05/06/2007   Hypertension associated with diabetes ([place]) 05/06/2007   Coronary atherosclerosis 05/06/2007    Past Surgical History:  Procedure Laterality Date   ABDOMINAL HYSTERECTOMY     ANTERIOR LAT LUMBAR FUSION Left 08/13/2017   Procedure: LEFT SIDED LUMBAR 3-4 LATERAL INTERBODY FUSION WITH INSTRUMENTATION AND ALLOGRAFT;  Surgeon: Phylliss Bob, MD;  Location: [facility];  Service: Orthopedics;  Laterality: Left;  LEFT SIDED LUMBAR 3-4 LATERAL INTERBODY FUSION WITH INSTRUMENTATION AND ALLOGRAFT; REQUEST 3 HOURS   Back Stimulator  07/2018   BACK SURGERY  03/2016   BIOPSY  06/28/2021   Procedure: BIOPSY;  Surgeon: Jerene Bears, MD;  Location: [facility];   Service: Gastroenterology;;   BREAST EXCISIONAL BIOPSY Right    40 years ago   CHOLECYSTECTOMY  06/2009   COLONOSCOPY     ESOPHAGOGASTRODUODENOSCOPY     ESOPHAGOGASTRODUODENOSCOPY (EGD) WITH PROPOFOL N/A 06/28/2021   Procedure: ESOPHAGOGASTRODUODENOSCOPY (EGD) WITH PROPOFOL;  Surgeon: Jerene Bears, MD;  Location: [facility];  Service: Gastroenterology;  Laterality: N/A;   EYE SURGERY Bilateral    lasik   Gastric Wedge resection lipoma  11/2007   x2 with laparotomy and gastrostomy   JOINT REPLACEMENT     Left knee replacement     ORIF FEMUR FRACTURE Right 04/16/2021   Procedure: OPEN REDUCTION INTERNAL FIXATION (ORIF) DISTAL FEMUR FRACTURE;  Surgeon: Shona Needles, MD;  Location: [facility];  Service: Orthopedics;  Laterality: Right;   Rigth knee replacement with revision  04/2008   Dr. Berenice Primas   ROTATOR CUFF REPAIR Left 01/2009   s/p bladder surgury  09/2009   Dr. Terance Hart   SPINAL CORD STIMULATOR INSERTION N/A 09/11/2018   Procedure: LUMBAR SPINAL CORD STIMULATOR INSERTION;  Surgeon: Clydell Hakim, MD;  Location: [facility];  Service: Neurosurgery;  Laterality: N/A;  LUMBAR SPINAL CORD STIMULATOR INSERTION     OB History     Gravida  3   Para  3   Term      Preterm      AB      Living  3      SAB      IAB      Ectopic      Multiple      Live Births              Family History  Problem Relation Age of Onset   Diabetes Sister        x 3   Heart disease Sister        x2   Breast cancer Sister 35   Diabetes Brother        x3   Heart disease Brother        x2   Coronary artery disease Other        female 1st degree   Hypertension Other    Breast cancer Sister 98   Breast cancer Other 25   Diabetes Brother    Colon cancer Neg Hx     Social History   Tobacco Use   Smoking status: Former   Smokeless tobacco: Never   Tobacco comments:    quit 30 years ago  Vaping Use   Vaping Use: Never used  Substance Use Topics   Alcohol use: No    Alcohol/week: 0.0  standard drinks   Drug use: No    Home Medications Prior to Admission medications   Medication Sig Start Date End Date Taking? Authorizing Provider  amLODipine (NORVASC) 10 MG tablet Take 1 tablet (10 mg total) by mouth daily. 04/25/21   Shelly Coss, MD  atorvastatin (LIPITOR) 40 MG tablet TAKE ONE TABLET BY MOUTH EVERY NIGHT AT BEDTIME 06/13/2021   Biagio Borg, MD  Continuous Blood Gluc Receiver (FREESTYLE LIBRE 2 READER) DEVI Use as directed twice daily E11.9 10/11/20   Biagio Borg, MD  Continuous Blood Gluc Sensor (FREESTYLE LIBRE 2 SENSOR) MISC Use as directed once bi-weekly E11.9 10/11/20   Biagio Borg, MD  docusate sodium (COLACE) 100 MG capsule Take 1 capsule (100 mg total) by mouth 2 (two) times daily as needed for mild constipation. 05/21/21   Dessa Phi, DO  ELMIRON 100 MG capsule Take 200 mg by mouth 2 (two) times daily.  Patient not taking: Reported on 07/20/2021 07/20/13   [provider]  fexofenadine (ALLEGRA) 180 MG tablet Take 1 tablet (180 mg total) by mouth daily. 05/01/21 05/01/22  Biagio Borg, MD  FLUoxetine (PROZAC) 20 MG capsule TAKE ONE CAPSULE BY MOUTH DAILY Patient not taking: Reported on 07/20/2021 11/13/20   Biagio Borg, MD  gabapentin (NEURONTIN) 300 MG capsule Take 300 mg by mouth 3 (three) times daily.    [provider]  glucose blood (FREESTYLE TEST STRIPS) test strip Use as instructed to check blood sugar 4 times daily. 04/20/18   Elayne Snare, MD  glucose blood test strip Use as instructed 12/21/20   Elayne Snare, MD  insulin glargine-yfgn (SEMGLEE) 100 UNIT/ML injection Inject 0.1 mLs (10 Units total) into the skin daily. 07/03/21   Shelly Coss, MD  insulin lispro (HUMALOG) 100 UNIT/ML injection Inject 3-20 Units into the skin See admin instructions. Per sliding scale 3 times daily    [provider]  isosorbide mononitrate (IMDUR) 60 MG 24 hr tablet Take 1 tablet (60  mg total) by mouth daily. 02/21/20   Biagio Borg, MD   levothyroxine (SYNTHROID) 50 MCG tablet Take 1 tablet (50 mcg total) by mouth daily at 6 (six) AM. Patient not taking: Reported on 07/20/2021 04/25/21   Shelly Coss, MD  levothyroxine (SYNTHROID) 75 MCG tablet Take 75 mcg by mouth daily. 07/13/21   [provider]  linaclotide Rolan Lipa) 72 MCG capsule Take 1 capsule (72 mcg total) by mouth daily before breakfast. 11/13/20   Biagio Borg, MD  losartan-hydrochlorothiazide (HYZAAR) 100-25 MG tablet Take 1 tablet by mouth daily. 07/19/21   [provider]  megestrol (MEGACE) 400 MG/10ML suspension Take 10 mLs (400 mg total) by mouth daily. 05/22/21   Dessa Phi, DO  metoprolol succinate (TOPROL-XL) 50 MG 24 hr tablet TAKE 1 TABLET BY MOUTH DAILY WITH OR IMMEDIATELY FOLLOWING A MEAL Patient taking differently: Take 50 mg by mouth daily. 03/29/21   Biagio Borg, MD  nitroGLYCERIN (NITROSTAT) 0.4 MG SL tablet Place 1 tablet (0.4 mg total) under the tongue every 5 (five) minutes as needed for chest pain. 01/27/20   Ghimire, Henreitta Leber, MD  ondansetron (ZOFRAN ODT) 8 MG disintegrating tablet 8mg  ODT q4 hours prn nausea Patient taking differently: Take 8 mg by mouth every 4 (four) hours as needed for nausea. 06/18/2021   Veryl Speak, MD  pantoprazole (PROTONIX) 40 MG tablet TAKE 1 TABLET(40 MG) BY MOUTH TWICE DAILY Patient taking differently: Take 40 mg by mouth 2 (two) times daily. TAKE 1 TABLET(40 MG) BY MOUTH TWICE DAILY 11/13/20   Biagio Borg, MD  polyethylene glycol (MIRALAX / GLYCOLAX) 17 g packet Take 17 g by mouth daily. 07/03/21   Shelly Coss, MD  traMADol (ULTRAM) 50 MG tablet Take 1 tablet (50 mg total) by mouth every 6 (six) hours as needed. 06/29/2021   Shelly Coss, MD  Vitamin D, Ergocalciferol, (DRISDOL) 1.25 MG (50000 UNIT) CAPS capsule Take 1 capsule (50,000 Units total) by mouth every 7 (seven) days. 06/08/2021   Shelly Coss, MD  enoxaparin (LOVENOX) 40 MG/0.4ML injection Inject 0.4 mLs (40 mg total) into the skin  daily. 04/18/21 04/24/21  Delray Alt, PA-C    Allergies    Ciprofloxacin, Hydrocodone, Hydrocortisone, Strawberry (diagnostic), Strawberry extract, Tomato, and Penicillins  Review of Systems   Review of Systems  Constitutional:  Negative for chills and fever.  HENT:  Negative for ear pain and sore throat.   Eyes:  Negative for pain and visual disturbance.  Respiratory:  Negative for cough and shortness of breath.   Cardiovascular:  Negative for chest pain and palpitations.  Gastrointestinal:  Negative for abdominal pain and vomiting.  Genitourinary:  Negative for dysuria and hematuria.  Musculoskeletal:  Positive for arthralgias and myalgias.  Skin:  Negative for color change and rash.  Neurological:  Negative for syncope and headaches.  All other systems reviewed and are negative.  Physical Exam Updated Vital Signs BP 130/86 (BP Location: Right Arm)   Pulse 90   Temp 98.2 F (36.8 C) (Oral)   Resp 18   Ht 5\' 5"  (1.651 m)   Wt 110.8 kg   SpO2 99%   BMI 40.65 kg/m   Physical Exam Constitutional:      General: She is not in acute distress. HENT:     Head: Normocephalic and atraumatic.  Eyes:     Conjunctiva/sclera: Conjunctivae normal.     Pupils: Pupils are equal, round, and reactive to light.  Cardiovascular:     Rate and  Rhythm: Normal rate and regular rhythm.  Pulmonary:     Effort: Pulmonary effort is normal. No respiratory distress.  Abdominal:     General: There is no distension.     Tenderness: There is no abdominal tenderness. There is no guarding or rebound.  Musculoskeletal:     Comments: Contractured fingers (Chronic) Right wrist with no visible deformity, moderate pain with range of motion, no palpable warmth or effusion Right forearm, elbow, upper arm unremarkable  Skin:    General: Skin is warm and dry.  Neurological:     General: No focal deficit present.     Mental Status: She is alert. Mental status is at baseline.    ED Results /  Procedures / Treatments   Labs (all labs ordered are listed, but only abnormal results are displayed) Labs Reviewed  CBG MONITORING, ED - Abnormal; Notable for the following components:      Result Value   Glucose-Capillary 208 (*)    All other components within normal limits    EKG None  Radiology DG Wrist Complete Right  Result Date: 08/03/2021 CLINICAL DATA:  [place] 1 week ago, right wrist pain EXAM: RIGHT WRIST - COMPLETE 3+ VIEW COMPARISON:  08/01/2014 FINDINGS: Frontal, oblique, lateral, and ulnar deviated views of the right wrist are obtained. No acute displaced fracture, subluxation, or dislocation. Mild osteoarthritis within the radial aspect of the carpus. Prior healed fifth metacarpal fracture. Mild dorsal soft tissue swelling. IMPRESSION: 1. No acute displaced fracture. 2. Mild osteoarthritis. 3. Mild dorsal soft tissue swelling. Electronically Signed   By: Randa Ngo M.D.   On: 08/03/2021 17:52    Procedures Procedures   Medications Ordered in ED Medications - No data to display   ED Course  I have reviewed the triage vital signs and the nursing notes.  Pertinent labs & imaging results that were available during my care of the patient were reviewed by me and considered in my medical decision making (see chart for details).  Patient is here complaining of right wrist pain.  I reviewed her x-rays of the wrist showing no focal fracture.  I do not see evidence of septic joint.  She does have osteoarthritis in his joint, may have a small effusion from trauma.  We offered a wrist splint as tolerated by the patient.  She can follow-up with her facility doctors for this.  No other acute traumatic injuries noted on exam.  Low suspicion for ICH.  Patient has no complaints of abdominal pain at this time.  It is possible she was experiencing some gastritis from her diet earlier today.  Supplemental history provided by the patient's daughter at bedside.  Patient is minimally  verbal with me.  Clinical Course as of 08/04/21 1020  Fri Aug 03, 2021  2148 Will give evening meds & glargine 10 units now [MT]    Clinical Course User Index [MT] [name], MD      Final Clinical Impression(s) / ED Diagnoses Final diagnoses:  Wrist pain, right    Rx / DC Orders ED Discharge Orders          Ordered    Splint wrist       Comments: Velcro wrist splint right wrist (if tolerate by patient)   08/03/21 2141             [name], MD 08/04/21 1019    [name], MD 08/04/21 1020

## 2021-08-03 NOTE — ED Provider Notes (Signed)
HPI  SUBJECTIVE:  [name] is a 70 y.o. female who presents with 2 issues: First, daughter reports left-sided abdominal and back pain starting last night.  It Is intermittent, patient is unable to tell me how long it lasts.  She describes it as dull, achy, sharp, stabbing, cramping.  She reports dysuria, urgency, frequency.  She wears depends.  She denies odorous urine or hematuria.  No nausea, vomiting, fevers.  She had a loose bowel movement last night.  No melena or hematochezia per daughter.  There are no aggravating or alleviating factors.  Patient has not tried anything for her abdominal pain.  It is not associated with urination, defecation, movement, p.o. intake.  She denies having symptoms like this before.  Second, daughter reports right wrist pain, swelling, erythema, increased temperature after having a fall presumably from bed 8 days ago.  Daughter states that she does not know why the patient fell, how far she fell,whether she hit her head.  Daughter states that patient had her wrist x-rayed at the SNF, but does not yet know the result.  No Altered mental status -  she is more withdrawn since the fall, but is acting and talking appropriately.  Symptoms worse with palpation, no alleviating factors.  Patient has not tried anything for her wrist pain.  Patient has an extensive past medical history including hypertension, diabetes, coronary disease, hypercholesterolemia, gastroparesis, hypothyroidism, chronic kidney disease stage III, recurrent UTI, pyelonephritis, peptic ulcer disease and peripheral neuropathy.  LPF:XTKW, Hunt Oris, MD  History extremely limited from patient.  Most history obtained from daughter.  Past Medical History:  Diagnosis Date   Anxiety    Arthritis    Blood dyscrasia    "FREE BLEEDER"   CERVICAL RADICULOPATHY, LEFT    Chronic back pain    CKD (chronic kidney disease)    DR. SANFORD  [facility]   COMMON MIGRAINE    CORONARY ARTERY DISEASE     Cough    CURRENT COLD   Cystitis    DEPRESSION    Diabetes mellitus, type II (HCC)    DIVERTICULOSIS-COLON    Dysrhythmia    palpitations   Gastric ulcer 04/2008   Gastroparesis    GERD (gastroesophageal reflux disease)    Hiatal hernia    Hyperlipidemia    Hypertension    Hypothyroidism    INSOMNIA-SLEEP DISORDER-UNSPEC    Iron deficiency anemia    Wears glasses     Past Surgical History:  Procedure Laterality Date   ABDOMINAL HYSTERECTOMY     ANTERIOR LAT LUMBAR FUSION Left 08/13/2017   Procedure: LEFT SIDED LUMBAR 3-4 LATERAL INTERBODY FUSION WITH INSTRUMENTATION AND ALLOGRAFT;  Surgeon: Phylliss Bob, MD;  Location: [place];  Service: Orthopedics;  Laterality: Left;  LEFT SIDED LUMBAR 3-4 LATERAL INTERBODY FUSION WITH INSTRUMENTATION AND ALLOGRAFT; REQUEST 3 HOURS   Back Stimulator  07/2018   BACK SURGERY  03/2016   BIOPSY  06/28/2021   Procedure: BIOPSY;  Surgeon: Jerene Bears, MD;  Location: [facility];  Service: Gastroenterology;;   BREAST EXCISIONAL BIOPSY Right    40 years ago   CHOLECYSTECTOMY  06/2009   COLONOSCOPY     ESOPHAGOGASTRODUODENOSCOPY     ESOPHAGOGASTRODUODENOSCOPY (EGD) WITH PROPOFOL N/A 06/28/2021   Procedure: ESOPHAGOGASTRODUODENOSCOPY (EGD) WITH PROPOFOL;  Surgeon: Jerene Bears, MD;  Location: [facility];  Service: Gastroenterology;  Laterality: N/A;   EYE SURGERY Bilateral    lasik   Gastric Wedge resection lipoma  11/2007   x2 with laparotomy  and gastrostomy   JOINT REPLACEMENT     Left knee replacement     ORIF FEMUR FRACTURE Right 04/16/2021   Procedure: OPEN REDUCTION INTERNAL FIXATION (ORIF) DISTAL FEMUR FRACTURE;  Surgeon: Shona Needles, MD;  Location: [place];  Service: Orthopedics;  Laterality: Right;   Rigth knee replacement with revision  04/2008   Dr. Berenice Primas   ROTATOR CUFF REPAIR Left 01/2009   s/p bladder surgury  09/2009   Dr. Terance Hart   SPINAL CORD STIMULATOR INSERTION N/A 09/11/2018   Procedure: LUMBAR SPINAL CORD STIMULATOR  INSERTION;  Surgeon: Clydell Hakim, MD;  Location: [place];  Service: Neurosurgery;  Laterality: N/A;  LUMBAR SPINAL CORD STIMULATOR INSERTION    Family History  Problem Relation Age of Onset   Diabetes Sister        x 3   Heart disease Sister        x2   Breast cancer Sister 51   Diabetes Brother        x3   Heart disease Brother        x2   Coronary artery disease Other        female 1st degree   Hypertension Other    Breast cancer Sister 13   Breast cancer Other 25   Diabetes Brother    Colon cancer Neg Hx     Social History   Tobacco Use   Smoking status: Former   Smokeless tobacco: Never   Tobacco comments:    quit 30 years ago  Vaping Use   Vaping Use: Never used  Substance Use Topics   Alcohol use: No    Alcohol/week: 0.0 standard drinks   Drug use: No    No current facility-administered medications for this encounter.  Current Outpatient Medications:    amLODipine (NORVASC) 10 MG tablet, Take 1 tablet (10 mg total) by mouth daily., Disp: 30 tablet, Rfl: 0   atorvastatin (LIPITOR) 40 MG tablet, TAKE ONE TABLET BY MOUTH EVERY NIGHT AT BEDTIME, Disp: 90 tablet, Rfl: 3   Continuous Blood Gluc Receiver (FREESTYLE LIBRE 2 READER) DEVI, Use as directed twice daily E11.9, Disp: 1 each, Rfl: 0   Continuous Blood Gluc Sensor (FREESTYLE LIBRE 2 SENSOR) MISC, Use as directed once bi-weekly E11.9, Disp: 6 each, Rfl: 3   docusate sodium (COLACE) 100 MG capsule, Take 1 capsule (100 mg total) by mouth 2 (two) times daily as needed for mild constipation., Disp: 10 capsule, Rfl: 0   ELMIRON 100 MG capsule, Take 200 mg by mouth 2 (two) times daily.  (Patient not taking: Reported on 07/20/2021), Disp: , Rfl:    fexofenadine (ALLEGRA) 180 MG tablet, Take 1 tablet (180 mg total) by mouth daily., Disp: 90 tablet, Rfl: 1   FLUoxetine (PROZAC) 20 MG capsule, TAKE ONE CAPSULE BY MOUTH DAILY (Patient not taking: Reported on 07/20/2021), Disp: 90 capsule, Rfl: 3   gabapentin (NEURONTIN) 300  MG capsule, Take 300 mg by mouth 3 (three) times daily., Disp: , Rfl:    glucose blood (FREESTYLE TEST STRIPS) test strip, Use as instructed to check blood sugar 4 times daily., Disp: 100 each, Rfl: 3   glucose blood test strip, Use as instructed, Disp: 100 each, Rfl: 12   insulin glargine-yfgn (SEMGLEE) 100 UNIT/ML injection, Inject 0.1 mLs (10 Units total) into the skin daily., Disp: 10 mL, Rfl: 0   insulin lispro (HUMALOG) 100 UNIT/ML injection, Inject 3-20 Units into the skin See admin instructions. Per sliding scale 3 times daily, Disp: ,  Rfl:    isosorbide mononitrate (IMDUR) 60 MG 24 hr tablet, Take 1 tablet (60 mg total) by mouth daily., Disp: 90 tablet, Rfl: 3   levothyroxine (SYNTHROID) 50 MCG tablet, Take 1 tablet (50 mcg total) by mouth daily at 6 (six) AM. (Patient not taking: Reported on 07/20/2021), Disp: 30 tablet, Rfl: 0   levothyroxine (SYNTHROID) 75 MCG tablet, Take 75 mcg by mouth daily., Disp: , Rfl:    linaclotide (LINZESS) 72 MCG capsule, Take 1 capsule (72 mcg total) by mouth daily before breakfast., Disp: 30 capsule, Rfl: 11   losartan-hydrochlorothiazide (HYZAAR) 100-25 MG tablet, Take 1 tablet by mouth daily., Disp: , Rfl:    megestrol (MEGACE) 400 MG/10ML suspension, Take 10 mLs (400 mg total) by mouth daily., Disp: 240 mL, Rfl: 0   metoprolol succinate (TOPROL-XL) 50 MG 24 hr tablet, TAKE 1 TABLET BY MOUTH DAILY WITH OR IMMEDIATELY FOLLOWING A MEAL (Patient taking differently: Take 50 mg by mouth daily.), Disp: 90 tablet, Rfl: 3   nitroGLYCERIN (NITROSTAT) 0.4 MG SL tablet, Place 1 tablet (0.4 mg total) under the tongue every 5 (five) minutes as needed for chest pain., Disp: 20 tablet, Rfl: 0   ondansetron (ZOFRAN ODT) 8 MG disintegrating tablet, 8mg  ODT q4 hours prn nausea (Patient taking differently: Take 8 mg by mouth every 4 (four) hours as needed for nausea.), Disp: 10 tablet, Rfl: 0   pantoprazole (PROTONIX) 40 MG tablet, TAKE 1 TABLET(40 MG) BY MOUTH TWICE DAILY  (Patient taking differently: Take 40 mg by mouth 2 (two) times daily. TAKE 1 TABLET(40 MG) BY MOUTH TWICE DAILY), Disp: 180 tablet, Rfl: 3   polyethylene glycol (MIRALAX / GLYCOLAX) 17 g packet, Take 17 g by mouth daily., Disp: 14 each, Rfl: 0   traMADol (ULTRAM) 50 MG tablet, Take 1 tablet (50 mg total) by mouth every 6 (six) hours as needed., Disp: 15 tablet, Rfl: 0   Vitamin D, Ergocalciferol, (DRISDOL) 1.25 MG (50000 UNIT) CAPS capsule, Take 1 capsule (50,000 Units total) by mouth every 7 (seven) days., Disp: 5 capsule, Rfl: 0  Allergies  Allergen Reactions   Ciprofloxacin Shortness Of Breath and Other (See Comments)    Dizziness    Hydrocodone Shortness Of Breath and Other (See Comments)    Tachycardia   Hydrocortisone    Strawberry (Diagnostic) Other (See Comments)    Abdominal pain   Strawberry Extract Other (See Comments)    Abdominal pain   Tomato Other (See Comments)    Abdominal pain   Penicillins Hives    Pt just remembers hives, SHE HAS HAD AUGEMTIN WITHOUT ISSUES PER PATIENT no SOB or lightheadedness with hypotension: No Has patient had a PCN reaction causing severe rash involving mucus membranes or skin necrosis: No Has patient had a PCN reaction that required hospitalization No Has patient had a PCN reaction occurring within the last 10 years: No If all of the above answers are "NO", then may proceed with Cephalosporin use.      ROS  As noted in HPI.   Physical Exam  BP (!) 143/73 (BP Location: Left Arm)   Pulse (!) 115   Temp 98.7 F (37.1 C) (Oral)   Resp 20   SpO2 100%   Constitutional: Well developed, well nourished, withdrawn. Eyes: PERRL, EOMI, conjunctiva normal bilaterally HENT: Normocephalic, atraumatic,mucus membranes moist Respiratory: Normal inspiratory effort Cardiovascular: Tachycardic.  No appreciable anterior or posterior rib tenderness GI: Soft, nondistended, normal bowel sounds, positive suprapubic, left flank, left upper quadrant left  lower  quadrant tenderness.  No rebound, no guarding Back: Left side CVAT skin: No rash, skin intact Musculoskeletal: R wrist: Hands curled.  Distal radius tender, distal ulnar styloid tender, snuffbox tender, carpals NT, metacarpals NT , digits NT, TFCC tender.   pain with supination,  pain with pronation, pain with radial / ulnar deviation.  Patient unable to flex / extend digits-this is chronic per daughter, sensation LT to hand normal . Rp 2+.  No bruising, erythema.  Positive diffuse wrist swelling. Elbow and proximal forearm NT.  Neurologic: Alert & oriented x 3, CN III-XII grossly intact, no motor deficits, sensation grossly intact Psychiatric: Speech and behavior appropriate   ED Course   Medications - No data to display  No orders of the defined types were placed in this encounter.  No results found. However, due to the size of the patient record, not all encounters were searched. Please check Results Review for a complete set of results. No results found.  ED Clinical Impression  1. Abdominal pain, unspecified abdominal location   2. Tachycardia   3. Wrist pain, acute, right   4. Fall, initial encounter      ED Assessment/Plan  1.  Abdominal pain.  She has frequent UTIs, has suprapubic, flank and left CVA tenderness, concern for UTI/pyelonephritis.  However, she also has left upper quadrant and left lower quadrant tenderness.  It is not known if she had trauma to her abdomen or back from her fall 8 days ago.  She is tachycardic, uncomfortable.  Feel that we need more information to rule out serious causes of her abdominal pain prior to sending her home.  Advised daughter and patient to go to the ED.  Feel that she is stable to go by private vehicle.  They state that they will go.  2.  Wrist pain.  She has diffuse wrist tenderness and some swelling.  Concern for wrist fracture although daughter states that the wrist was x-rayed after the fall.  There is no evidence of  neurovascular compromise here.   Discussed medical decision making, rationale for transfer to the emergency department.  They agree with plan.   No orders of the defined types were placed in this encounter.     *This clinic note was created using Dragon dictation software. Therefore, there may be occasional mistakes despite careful proofreading. ?    Melynda Ripple, MD 08/03/21 1557

## 2021-08-03 NOTE — ED Triage Notes (Signed)
He fell at the nursing home a week ago. Injury to her right wrist.

## 2021-08-03 NOTE — Discharge Instructions (Signed)
There was no fractures seen on the xray of [name]'s right wrist.  We offered a wrist splint for her comfort.  She can follow up with her doctor or facility doctor if she continues having pain here.

## 2021-08-03 NOTE — ED Triage Notes (Signed)
Pt's daughter reports pt fell out of bed last Thursday and hurt right wrist/hand. Daughter was told by staff at SNF that was x-rayed but hasnt received any results. Reports right wrist/hand swollen and darker in color.  Pt c/o left side pain of abd area since last  night. Pt denies n/v/d or urinary pain.

## 2021-08-03 NOTE — ED Notes (Signed)
Unable to reach staff at [facility] to verify staff available to move pt from car. [facility] contacted for transport

## 2021-08-03 NOTE — Discharge Instructions (Addendum)
Go immediately to the emergency room of your choice.  I am concerned that she may have a kidney infection plus or minus a broken wrist.  We need more information to make sure that she is safe to go home.  Let them know if her pain changes or gets worse.

## 2021-08-04 DIAGNOSIS — I2510 Atherosclerotic heart disease of native coronary artery without angina pectoris: Secondary | ICD-10-CM | POA: Diagnosis not present

## 2021-08-04 DIAGNOSIS — M6281 Muscle weakness (generalized): Secondary | ICD-10-CM | POA: Diagnosis not present

## 2021-08-04 DIAGNOSIS — K219 Gastro-esophageal reflux disease without esophagitis: Secondary | ICD-10-CM | POA: Diagnosis not present

## 2021-08-04 DIAGNOSIS — F419 Anxiety disorder, unspecified: Secondary | ICD-10-CM | POA: Diagnosis not present

## 2021-08-04 DIAGNOSIS — M25531 Pain in right wrist: Secondary | ICD-10-CM | POA: Diagnosis not present

## 2021-08-04 DIAGNOSIS — K5900 Constipation, unspecified: Secondary | ICD-10-CM | POA: Diagnosis not present

## 2021-08-04 DIAGNOSIS — M1990 Unspecified osteoarthritis, unspecified site: Secondary | ICD-10-CM | POA: Diagnosis not present

## 2021-08-04 DIAGNOSIS — I10 Essential (primary) hypertension: Secondary | ICD-10-CM | POA: Diagnosis not present

## 2021-08-04 DIAGNOSIS — F32A Depression, unspecified: Secondary | ICD-10-CM | POA: Diagnosis not present

## 2021-08-06 ENCOUNTER — Ambulatory Visit: Payer: Medicare Other | Admitting: Podiatry

## 2021-08-06 DIAGNOSIS — I10 Essential (primary) hypertension: Secondary | ICD-10-CM | POA: Diagnosis not present

## 2021-08-06 DIAGNOSIS — F32A Depression, unspecified: Secondary | ICD-10-CM | POA: Diagnosis not present

## 2021-08-06 DIAGNOSIS — E11649 Type 2 diabetes mellitus with hypoglycemia without coma: Secondary | ICD-10-CM | POA: Diagnosis not present

## 2021-08-06 DIAGNOSIS — E119 Type 2 diabetes mellitus without complications: Secondary | ICD-10-CM | POA: Diagnosis not present

## 2021-08-06 DIAGNOSIS — M25531 Pain in right wrist: Secondary | ICD-10-CM | POA: Diagnosis not present

## 2021-08-06 DIAGNOSIS — R4189 Other symptoms and signs involving cognitive functions and awareness: Secondary | ICD-10-CM | POA: Diagnosis not present

## 2021-08-13 ENCOUNTER — Other Ambulatory Visit: Payer: Self-pay | Admitting: *Deleted

## 2021-08-13 NOTE — Patient Outreach (Signed)
THN Post- Acute Care Coordinator follow up. [name] remains in [facility] SN[facility].   Communication sent to [facility] to inquire about transition plans. Telephone call made to daughter/DPR/HCPOA Laneta Simmers [phone] to discuss how things are going. Chrystal states they are awaiting Medicaid determination. States if [name] is not approved for LTC Medicaid she will have to go home with her. If [name] is approved for LTC Medicaid she will stay in a facility long term. Chrystal states she still has not heard back from [facility] after initial report was made.   Discussed that writer will follow up with Chrystal next week.  Will continue to follow while [name] resides in SNF. Will update [facility] embedded RNCM with PCP office.    Sherry Rolling, MSN, RN,BSN [place] Acute Care Coordinator [phone] [facility]) [phone]  (Toll free office)

## 2021-08-15 ENCOUNTER — Ambulatory Visit: Payer: Medicare Other | Admitting: Neurology

## 2021-08-16 DIAGNOSIS — F419 Anxiety disorder, unspecified: Secondary | ICD-10-CM | POA: Diagnosis not present

## 2021-08-16 DIAGNOSIS — M6281 Muscle weakness (generalized): Secondary | ICD-10-CM | POA: Diagnosis not present

## 2021-08-16 DIAGNOSIS — M25531 Pain in right wrist: Secondary | ICD-10-CM | POA: Diagnosis not present

## 2021-08-16 DIAGNOSIS — E119 Type 2 diabetes mellitus without complications: Secondary | ICD-10-CM | POA: Diagnosis not present

## 2021-08-17 DIAGNOSIS — R531 Weakness: Secondary | ICD-10-CM | POA: Diagnosis not present

## 2021-08-17 DIAGNOSIS — D62 Acute posthemorrhagic anemia: Secondary | ICD-10-CM | POA: Diagnosis not present

## 2021-08-17 DIAGNOSIS — E039 Hypothyroidism, unspecified: Secondary | ICD-10-CM | POA: Diagnosis not present

## 2021-08-17 DIAGNOSIS — I10 Essential (primary) hypertension: Secondary | ICD-10-CM | POA: Diagnosis not present

## 2021-08-19 DIAGNOSIS — F32A Depression, unspecified: Secondary | ICD-10-CM | POA: Diagnosis not present

## 2021-08-19 DIAGNOSIS — K219 Gastro-esophageal reflux disease without esophagitis: Secondary | ICD-10-CM | POA: Diagnosis not present

## 2021-08-19 DIAGNOSIS — K5900 Constipation, unspecified: Secondary | ICD-10-CM | POA: Diagnosis not present

## 2021-08-19 DIAGNOSIS — M25531 Pain in right wrist: Secondary | ICD-10-CM | POA: Diagnosis not present

## 2021-08-19 DIAGNOSIS — R4189 Other symptoms and signs involving cognitive functions and awareness: Secondary | ICD-10-CM | POA: Diagnosis not present

## 2021-08-19 DIAGNOSIS — F419 Anxiety disorder, unspecified: Secondary | ICD-10-CM | POA: Diagnosis not present

## 2021-08-19 DIAGNOSIS — I2510 Atherosclerotic heart disease of native coronary artery without angina pectoris: Secondary | ICD-10-CM | POA: Diagnosis not present

## 2021-08-19 DIAGNOSIS — M6281 Muscle weakness (generalized): Secondary | ICD-10-CM | POA: Diagnosis not present

## 2021-08-19 DIAGNOSIS — M1990 Unspecified osteoarthritis, unspecified site: Secondary | ICD-10-CM | POA: Diagnosis not present

## 2021-08-21 ENCOUNTER — Other Ambulatory Visit: Payer: Self-pay

## 2021-08-21 ENCOUNTER — Ambulatory Visit (INDEPENDENT_AMBULATORY_CARE_PROVIDER_SITE_OTHER): Payer: Medicare Other | Admitting: Neurology

## 2021-08-21 DIAGNOSIS — R531 Weakness: Secondary | ICD-10-CM

## 2021-08-21 DIAGNOSIS — F32A Depression, unspecified: Secondary | ICD-10-CM | POA: Diagnosis not present

## 2021-08-21 DIAGNOSIS — M6281 Muscle weakness (generalized): Secondary | ICD-10-CM | POA: Diagnosis not present

## 2021-08-21 DIAGNOSIS — G629 Polyneuropathy, unspecified: Secondary | ICD-10-CM

## 2021-08-21 DIAGNOSIS — M549 Dorsalgia, unspecified: Secondary | ICD-10-CM | POA: Diagnosis not present

## 2021-08-21 DIAGNOSIS — F419 Anxiety disorder, unspecified: Secondary | ICD-10-CM | POA: Diagnosis not present

## 2021-08-21 DIAGNOSIS — M1990 Unspecified osteoarthritis, unspecified site: Secondary | ICD-10-CM | POA: Diagnosis not present

## 2021-08-21 DIAGNOSIS — M25531 Pain in right wrist: Secondary | ICD-10-CM | POA: Diagnosis not present

## 2021-08-21 NOTE — Telephone Encounter (Signed)
Addressed concerns in-office today. See note for details.

## 2021-08-21 NOTE — Progress Notes (Signed)
Patient arrived for EMG BUE, but it was mutually decided not to proceed with testing due to hand pain and inability to be positioned for optimal electrodiagnostic testing.   I explained that prior EDX from 2018 demonstrated severe neuropathy in the lower extremities and most likely, there has been ongoing progression of neuropathy contributing to her hand and lower extremity weakness. I also explained that because of poor effort, it is very difficult to understand the true degree of weakness.  Patient requires repeated encouragement for muscle movement.  Exam shows antigravity movement in the upper extremity, clenched hands, 2/5 strength in the legs. She arrived in wheelchair. Difficult to assess tone because she does not relax.  Prior work-up shows no evidence of stroke or spinal cord pathology.  Normal CK makes myopathy unlikely.   At this juncture, I explained that she most likely has progression of diabetic neuropathy, for which management remains supportive. There is also despondency and deconditioning playing a role here. She has chronic pain and I suggest that she establish care with pain management. Continue PT/OT Continue optimal diabetes management  Sherry Gruner K. Mckoy Bhakta, DO  Total time spent: 10 min

## 2021-08-22 ENCOUNTER — Telehealth: Payer: Self-pay | Admitting: Neurology

## 2021-08-22 DIAGNOSIS — G894 Chronic pain syndrome: Secondary | ICD-10-CM

## 2021-08-22 NOTE — Telephone Encounter (Signed)
OK to refer to [facility].  Please let her know that it is an out-patient center, not something the facility can provide.

## 2021-08-22 NOTE — Telephone Encounter (Signed)
Pt's daughter called in and left a message. She stated the facility the patient lives in needs an order for the pain management that was talked about so they can get that started.

## 2021-08-22 NOTE — Telephone Encounter (Signed)
Called patients daughter and informed her that Dr. Posey Pronto will refer patient to [facility]. Informed patients daughter that we do not send referrals to the living facility and that we send referrals as a outpatient center. Informed patients daughter that someone from [facility] will contact them.

## 2021-08-23 DIAGNOSIS — F32A Depression, unspecified: Secondary | ICD-10-CM | POA: Diagnosis not present

## 2021-08-23 DIAGNOSIS — M549 Dorsalgia, unspecified: Secondary | ICD-10-CM | POA: Diagnosis not present

## 2021-08-23 DIAGNOSIS — E119 Type 2 diabetes mellitus without complications: Secondary | ICD-10-CM | POA: Diagnosis not present

## 2021-08-23 DIAGNOSIS — M1990 Unspecified osteoarthritis, unspecified site: Secondary | ICD-10-CM | POA: Diagnosis not present

## 2021-08-23 DIAGNOSIS — F419 Anxiety disorder, unspecified: Secondary | ICD-10-CM | POA: Diagnosis not present

## 2021-08-23 DIAGNOSIS — R4189 Other symptoms and signs involving cognitive functions and awareness: Secondary | ICD-10-CM | POA: Diagnosis not present

## 2021-08-23 DIAGNOSIS — M6281 Muscle weakness (generalized): Secondary | ICD-10-CM | POA: Diagnosis not present

## 2021-08-24 ENCOUNTER — Encounter: Payer: Self-pay | Admitting: Podiatry

## 2021-08-24 ENCOUNTER — Ambulatory Visit (INDEPENDENT_AMBULATORY_CARE_PROVIDER_SITE_OTHER): Payer: Medicare Other | Admitting: Podiatry

## 2021-08-24 ENCOUNTER — Other Ambulatory Visit: Payer: Self-pay

## 2021-08-24 DIAGNOSIS — M79674 Pain in right toe(s): Secondary | ICD-10-CM | POA: Diagnosis not present

## 2021-08-24 DIAGNOSIS — Z9189 Other specified personal risk factors, not elsewhere classified: Secondary | ICD-10-CM | POA: Diagnosis not present

## 2021-08-24 DIAGNOSIS — M79675 Pain in left toe(s): Secondary | ICD-10-CM

## 2021-08-24 DIAGNOSIS — B351 Tinea unguium: Secondary | ICD-10-CM | POA: Diagnosis not present
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
## 2021-08-25 DIAGNOSIS — F32A Depression, unspecified: Secondary | ICD-10-CM | POA: Diagnosis not present

## 2021-08-25 DIAGNOSIS — M6281 Muscle weakness (generalized): Secondary | ICD-10-CM | POA: Diagnosis not present

## 2021-08-25 DIAGNOSIS — K219 Gastro-esophageal reflux disease without esophagitis: Secondary | ICD-10-CM | POA: Diagnosis not present

## 2021-08-25 DIAGNOSIS — M1990 Unspecified osteoarthritis, unspecified site: Secondary | ICD-10-CM | POA: Diagnosis not present

## 2021-08-25 DIAGNOSIS — F419 Anxiety disorder, unspecified: Secondary | ICD-10-CM | POA: Diagnosis not present

## 2021-08-25 DIAGNOSIS — I2510 Atherosclerotic heart disease of native coronary artery without angina pectoris: Secondary | ICD-10-CM | POA: Diagnosis not present

## 2021-08-25 DIAGNOSIS — I10 Essential (primary) hypertension: Secondary | ICD-10-CM | POA: Diagnosis not present

## 2021-08-28 ENCOUNTER — Other Ambulatory Visit: Payer: Self-pay | Admitting: *Deleted

## 2021-08-28 ENCOUNTER — Encounter: Payer: Self-pay | Admitting: Physical Medicine and Rehabilitation

## 2021-08-28 DIAGNOSIS — M549 Dorsalgia, unspecified: Secondary | ICD-10-CM | POA: Diagnosis not present

## 2021-08-28 DIAGNOSIS — M1990 Unspecified osteoarthritis, unspecified site: Secondary | ICD-10-CM | POA: Diagnosis not present

## 2021-08-28 DIAGNOSIS — F331 Major depressive disorder, recurrent, moderate: Secondary | ICD-10-CM | POA: Diagnosis not present

## 2021-08-28 DIAGNOSIS — F419 Anxiety disorder, unspecified: Secondary | ICD-10-CM | POA: Diagnosis not present

## 2021-08-28 DIAGNOSIS — F32A Depression, unspecified: Secondary | ICD-10-CM | POA: Diagnosis not present

## 2021-08-28 DIAGNOSIS — R4189 Other symptoms and signs involving cognitive functions and awareness: Secondary | ICD-10-CM | POA: Diagnosis not present

## 2021-08-28 DIAGNOSIS — M6281 Muscle weakness (generalized): Secondary | ICD-10-CM | POA: Diagnosis not present

## 2021-08-28 NOTE — Patient Outreach (Signed)
THN Post- Acute Care Coordinator follow up. Mrs. Sherpa resides in [facility] SNF.   Telephone call made to [facility] (daughter/DPR) [phone] to follow up. Chrystal states Medicaid was not approved because state approved FL2 was not submitted by the SNF. Chrystal states she now has to restart the Medicaid application.  Discussed that Mrs. Tocci appears to have used 80 plus days in SNF. Chrystal (daughter) states she waiting to hear back from the facility. Chrystal is still working on securing assistance for Mrs. Scialdone at home.  Chrystal (daughter) states she does have DME at home in case Mrs. Wren does return. Hospital bed, wheelchair, and hoyer lift. Chrystal works during the day.  Chrystal (daughter) states long term is not the ultimate goal at this time. However, Chrystal states she wants member to stay longer in SNF with Medicaid if/when approved.   Encouraged Chrystal (daughter) to schedule  meeting with facility staff.   Will plan to contact Chrystal next week to follow up.    [name], MSN, RN,BSN [place] Acute Care Coordinator [phone] [facility]) [phone]  (Toll free office)

## 2021-08-29 ENCOUNTER — Ambulatory Visit: Payer: Medicare Other | Admitting: Podiatry

## 2021-09-03 DIAGNOSIS — M961 Postlaminectomy syndrome, not elsewhere classified: Secondary | ICD-10-CM | POA: Diagnosis not present

## 2021-09-03 DIAGNOSIS — Z5181 Encounter for therapeutic drug level monitoring: Secondary | ICD-10-CM | POA: Diagnosis not present

## 2021-09-03 DIAGNOSIS — Z9689 Presence of other specified functional implants: Secondary | ICD-10-CM | POA: Diagnosis not present

## 2021-09-03 DIAGNOSIS — R296 Repeated falls: Secondary | ICD-10-CM | POA: Diagnosis not present

## 2021-09-05 ENCOUNTER — Other Ambulatory Visit: Payer: Self-pay | Admitting: *Deleted

## 2021-09-05 NOTE — Patient Outreach (Signed)
THN  Post- Acute Care Coordinator follow up. Mrs. Demuro resides in [facility] SNF per [facility].   Telephone call made to daughter/DPR [facility] [phone]. Chrystal states Mrs. Harp remains in SNF and is progressing with therapy. Chrystal states the transition plan is to come home with her.  Writer will continue to follow while Mrs. Strehlow remains in SNF.    [name], MSN, RN,BSN [place] Acute Care Coordinator [phone] [facility]) [phone]  (Toll free office)

## 2021-09-06 DIAGNOSIS — R293 Abnormal posture: Secondary | ICD-10-CM | POA: Diagnosis not present

## 2021-09-06 DIAGNOSIS — M6281 Muscle weakness (generalized): Secondary | ICD-10-CM | POA: Diagnosis not present

## 2021-09-06 DIAGNOSIS — K219 Gastro-esophageal reflux disease without esophagitis: Secondary | ICD-10-CM | POA: Diagnosis not present

## 2021-09-06 DIAGNOSIS — G3184 Mild cognitive impairment, so stated: Secondary | ICD-10-CM | POA: Diagnosis not present

## 2021-09-06 DIAGNOSIS — R278 Other lack of coordination: Secondary | ICD-10-CM | POA: Diagnosis not present

## 2021-09-06 DIAGNOSIS — M6259 Muscle wasting and atrophy, not elsewhere classified, multiple sites: Secondary | ICD-10-CM | POA: Diagnosis not present

## 2021-09-06 DIAGNOSIS — D62 Acute posthemorrhagic anemia: Secondary | ICD-10-CM | POA: Diagnosis not present

## 2021-09-06 DIAGNOSIS — R2681 Unsteadiness on feet: Secondary | ICD-10-CM | POA: Diagnosis not present

## 2021-09-06 DIAGNOSIS — E1169 Type 2 diabetes mellitus with other specified complication: Secondary | ICD-10-CM | POA: Diagnosis not present

## 2021-09-06 DIAGNOSIS — F411 Generalized anxiety disorder: Secondary | ICD-10-CM | POA: Diagnosis not present

## 2021-09-06 DIAGNOSIS — N183 Chronic kidney disease, stage 3 unspecified: Secondary | ICD-10-CM | POA: Diagnosis not present

## 2021-09-06 DIAGNOSIS — I10 Essential (primary) hypertension: Secondary | ICD-10-CM | POA: Diagnosis not present

## 2021-09-06 DIAGNOSIS — F32A Depression, unspecified: Secondary | ICD-10-CM | POA: Diagnosis not present

## 2021-09-06 DIAGNOSIS — Z741 Need for assistance with personal care: Secondary | ICD-10-CM | POA: Diagnosis not present

## 2021-09-06 DIAGNOSIS — I2510 Atherosclerotic heart disease of native coronary artery without angina pectoris: Secondary | ICD-10-CM | POA: Diagnosis not present

## 2021-09-06 DIAGNOSIS — E039 Hypothyroidism, unspecified: Secondary | ICD-10-CM | POA: Diagnosis not present

## 2021-09-07 DIAGNOSIS — E039 Hypothyroidism, unspecified: Secondary | ICD-10-CM | POA: Diagnosis not present

## 2021-09-07 DIAGNOSIS — F419 Anxiety disorder, unspecified: Secondary | ICD-10-CM | POA: Diagnosis not present

## 2021-09-07 DIAGNOSIS — R4189 Other symptoms and signs involving cognitive functions and awareness: Secondary | ICD-10-CM | POA: Diagnosis not present

## 2021-09-07 DIAGNOSIS — D62 Acute posthemorrhagic anemia: Secondary | ICD-10-CM | POA: Diagnosis not present

## 2021-09-07 DIAGNOSIS — F32A Depression, unspecified: Secondary | ICD-10-CM | POA: Diagnosis not present

## 2021-09-07 DIAGNOSIS — M1990 Unspecified osteoarthritis, unspecified site: Secondary | ICD-10-CM | POA: Diagnosis not present

## 2021-09-07 DIAGNOSIS — I10 Essential (primary) hypertension: Secondary | ICD-10-CM | POA: Diagnosis not present

## 2021-09-07 DIAGNOSIS — R531 Weakness: Secondary | ICD-10-CM | POA: Diagnosis not present

## 2021-09-07 DIAGNOSIS — E119 Type 2 diabetes mellitus without complications: Secondary | ICD-10-CM | POA: Diagnosis not present

## 2021-09-09 DIAGNOSIS — F419 Anxiety disorder, unspecified: Secondary | ICD-10-CM | POA: Diagnosis not present

## 2021-09-09 DIAGNOSIS — I10 Essential (primary) hypertension: Secondary | ICD-10-CM | POA: Diagnosis not present

## 2021-09-09 DIAGNOSIS — F32A Depression, unspecified: Secondary | ICD-10-CM | POA: Diagnosis not present

## 2021-09-09 DIAGNOSIS — R4189 Other symptoms and signs involving cognitive functions and awareness: Secondary | ICD-10-CM | POA: Diagnosis not present

## 2021-09-09 DIAGNOSIS — I2510 Atherosclerotic heart disease of native coronary artery without angina pectoris: Secondary | ICD-10-CM | POA: Diagnosis not present

## 2021-09-09 DIAGNOSIS — M1990 Unspecified osteoarthritis, unspecified site: Secondary | ICD-10-CM | POA: Diagnosis not present

## 2021-09-09 DIAGNOSIS — K219 Gastro-esophageal reflux disease without esophagitis: Secondary | ICD-10-CM | POA: Diagnosis not present

## 2021-09-09 DIAGNOSIS — M6281 Muscle weakness (generalized): Secondary | ICD-10-CM | POA: Diagnosis not present

## 2021-09-11 ENCOUNTER — Other Ambulatory Visit: Payer: Self-pay | Admitting: *Deleted

## 2021-09-11 NOTE — Patient Outreach (Signed)
THN Post- Acute Care Coordinator follow up. Mrs. Nierman remains in [facility] SNF.   Spoke with [facility] Evener SNF SW who indicates Medicaid application has been resubmitted.   Writer will continue to follow while Mrs. Hassebrock resides in SNF.    [name], MSN, RN,BSN [place] Acute Care Coordinator [phone] [facility]) [phone]  (Toll free office)

## 2021-09-13 ENCOUNTER — Other Ambulatory Visit: Payer: Self-pay | Admitting: *Deleted

## 2021-09-13 NOTE — Patient Outreach (Signed)
THN Post- Acute Care Coordinator follow up. Per [place] it appears Mrs. Villena transitioned from [facility] SNF on 09/21/2021.   Spoke with Virgina Evener SNF SW to confirm that Mrs. Gholson expired on 09/29/2021 at [facility] SN[facility].    Attempted to reach daughter Chrystal to express condolences. No answer.   Will update [facility] Embedded Care Coordination team members with Armandina Gemma PCP office.   [name], MSN, RN,BSN [place] Acute Care Coordinator [phone] [facility]) [phone]  (Toll free office)

## 2021-09-14 ENCOUNTER — Telehealth: Payer: Self-pay | Admitting: Internal Medicine

## 2021-09-14 ENCOUNTER — Ambulatory Visit: Payer: Self-pay | Admitting: *Deleted

## 2021-09-14 DIAGNOSIS — Z794 Long term (current) use of insulin: Secondary | ICD-10-CM

## 2021-09-14 DIAGNOSIS — E119 Type 2 diabetes mellitus without complications: Secondary | ICD-10-CM

## 2021-09-14 DIAGNOSIS — I10 Essential (primary) hypertension: Secondary | ICD-10-CM

## 2021-09-14 NOTE — Telephone Encounter (Signed)
Patient daughter calling in  Calling in to make [place] aware that patient passed away wednesday September 18, 2021
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
## 2021-10-07 DEATH — deceased

## 2021-11-28 ENCOUNTER — Ambulatory Visit: Payer: Medicare Other | Admitting: Podiatry

## 2021-11-30 ENCOUNTER — Ambulatory Visit: Payer: Medicare Other | Admitting: Physical Medicine and Rehabilitation

## 2021-11-30 IMAGING — CR DG HIP (WITH OR WITHOUT PELVIS) 2-3V*R*
3 series · 3 of 3 positions shown · non-contrast
Comparison: None.

CLINICAL DATA: Right hip pain following fall, initial encounter

EXAM:
DG HIP (WITH OR WITHOUT PELVIS) 3V RIGHT

[hip ap]
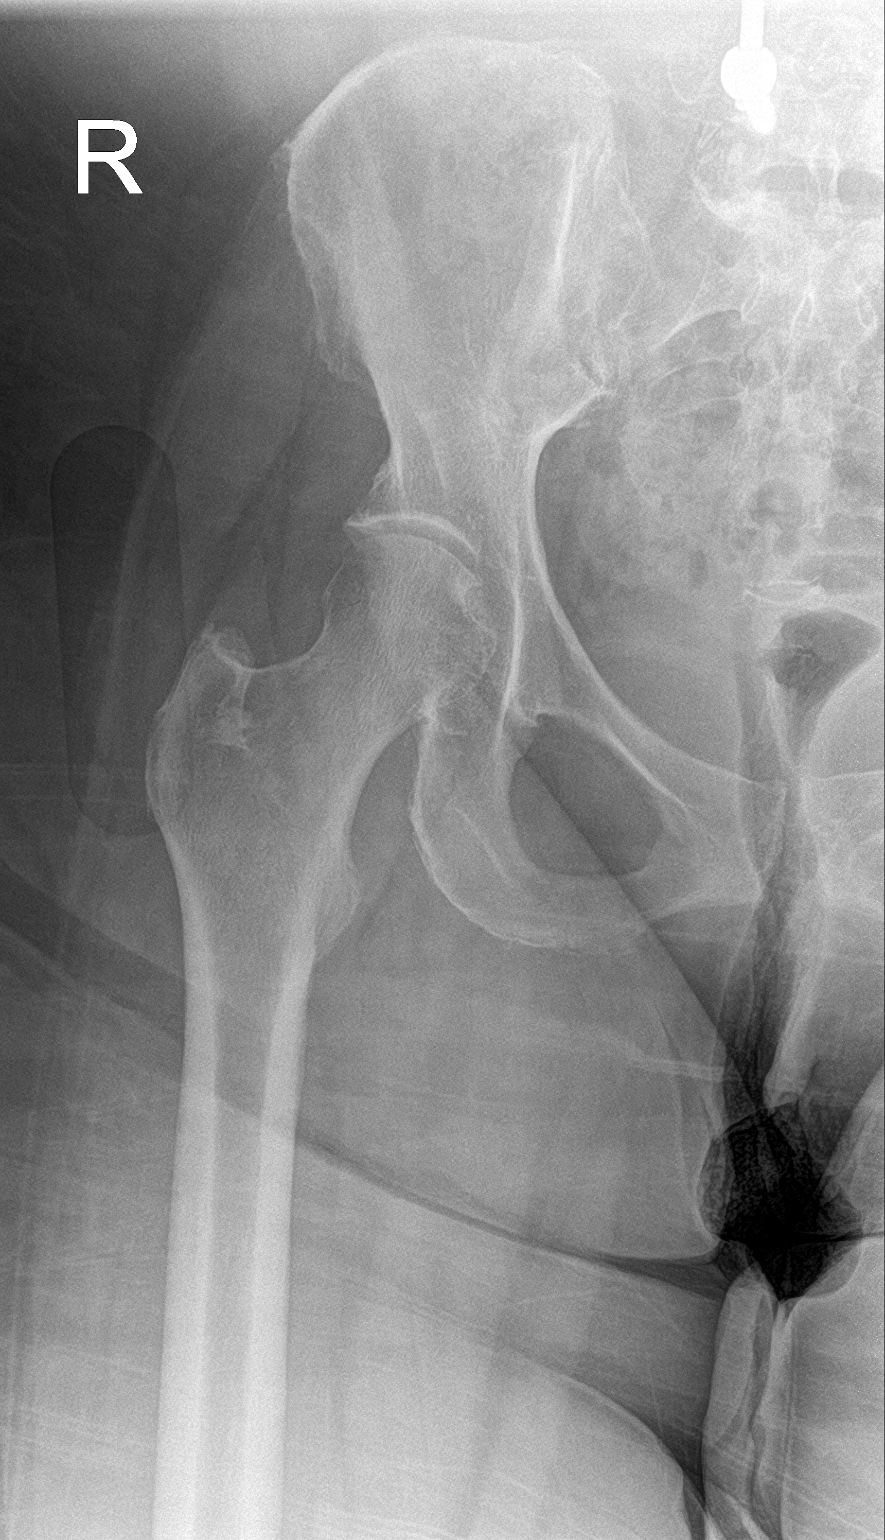

[hip lat]
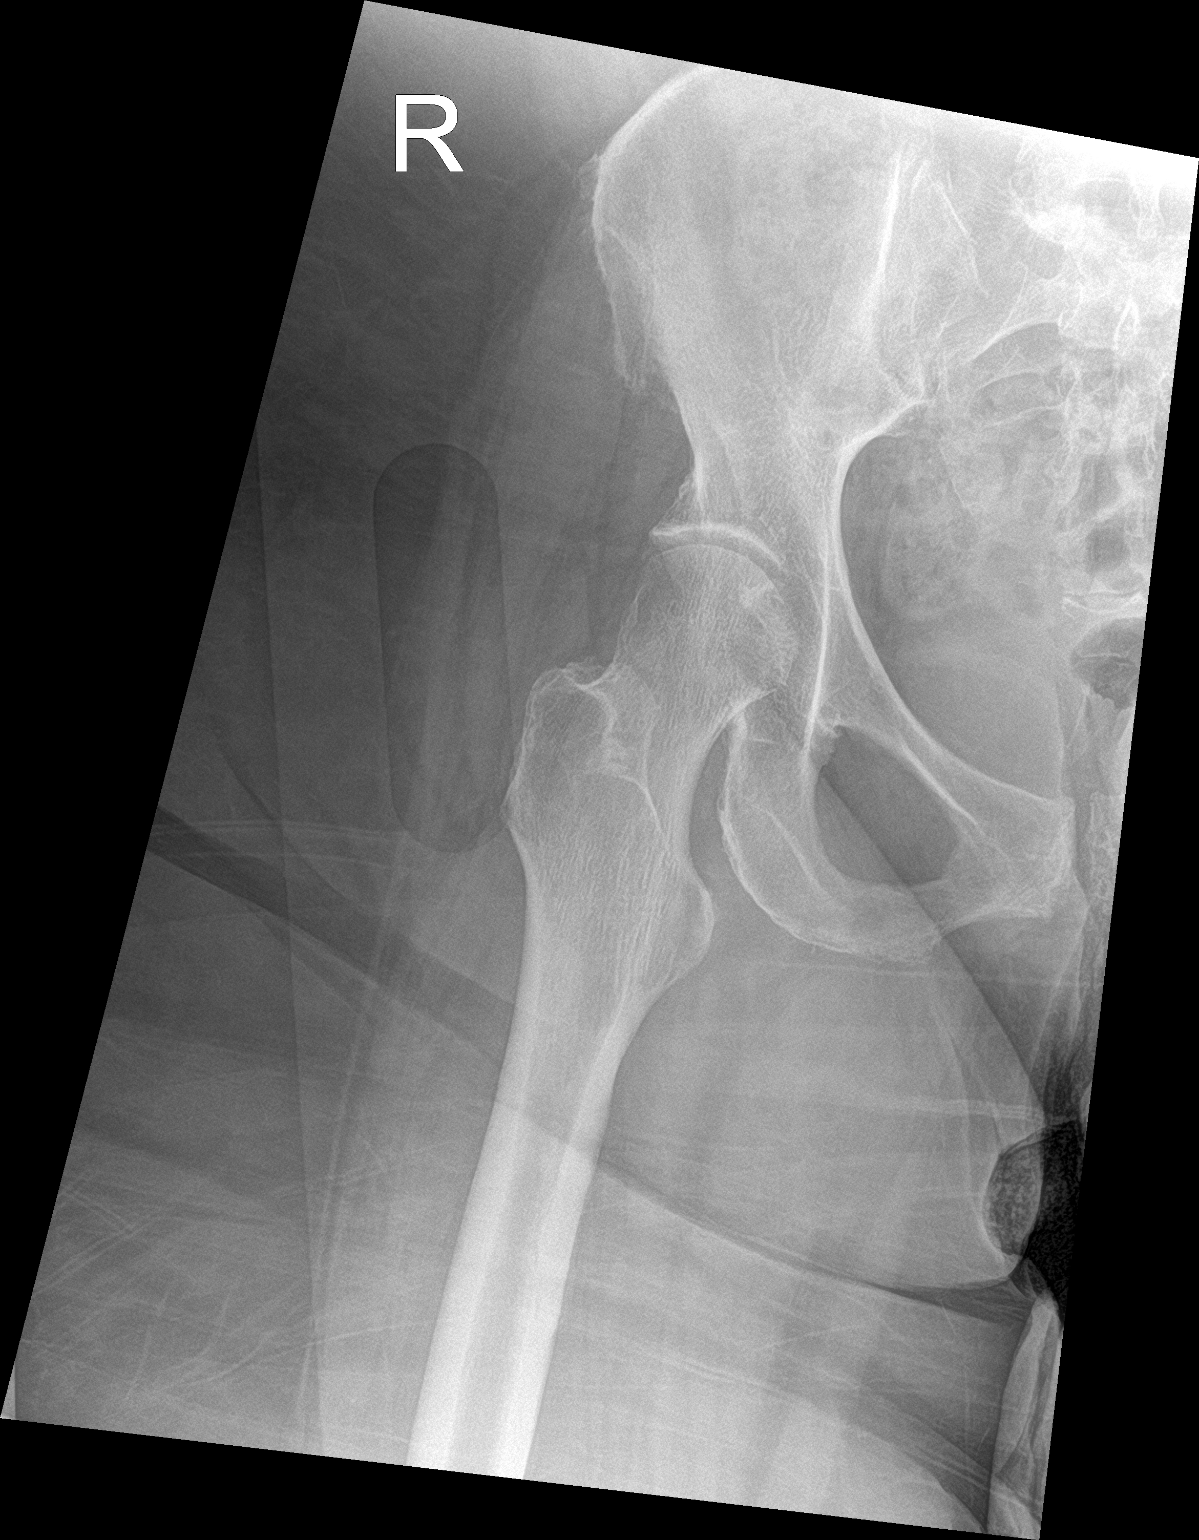

[pelvis ap]
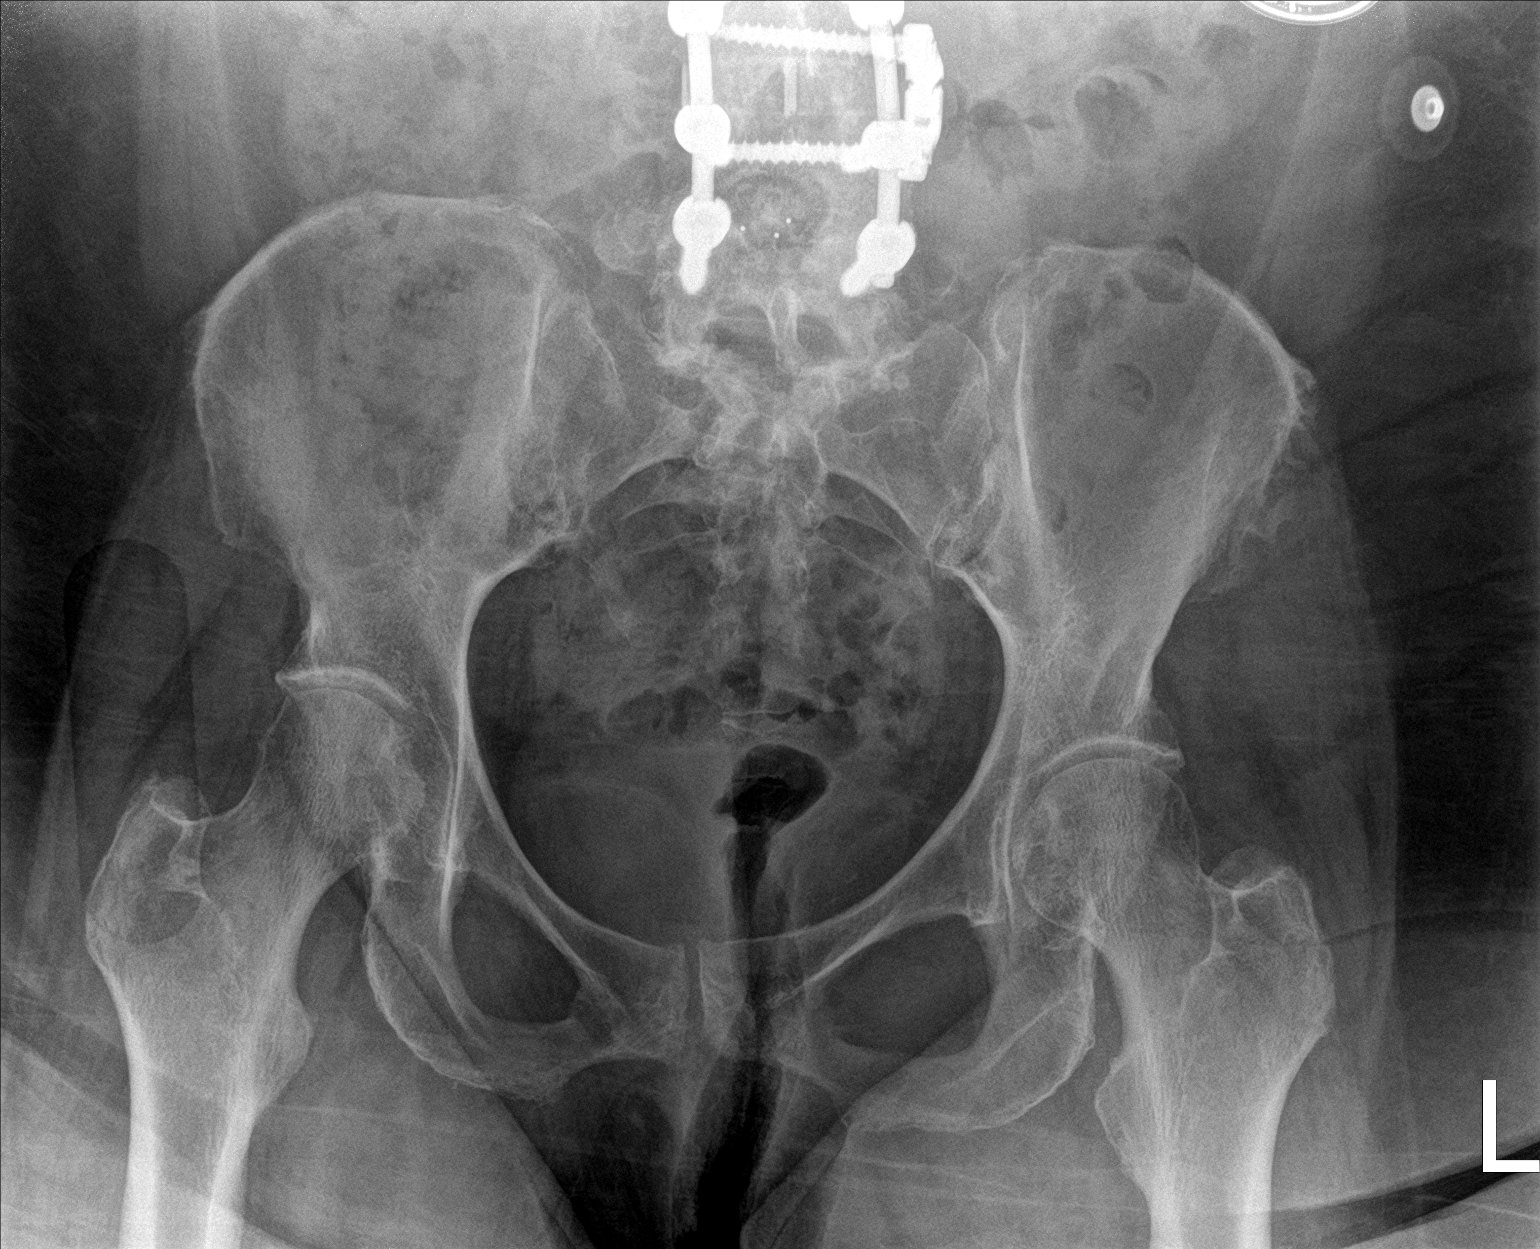

[3 of 3 positions shown; findings below may reference images not displayed]

FINDINGS: Pelvic ring is intact. Postsurgical changes in the lower lumbar
spine are seen. No acute fracture or dislocation is noted. No soft
tissue abnormality is seen.
IMPRESSION: No acute abnormality noted.

## 2021-11-30 IMAGING — CR DG ANKLE COMPLETE 3+V*R*
3 series · 3 of 3 positions shown · non-contrast
Comparison: None.

CLINICAL DATA: Recent falls with right ankle pain, initial
encounter

EXAM:
RIGHT ANKLE - COMPLETE 3+ VIEW

[ankle obl]
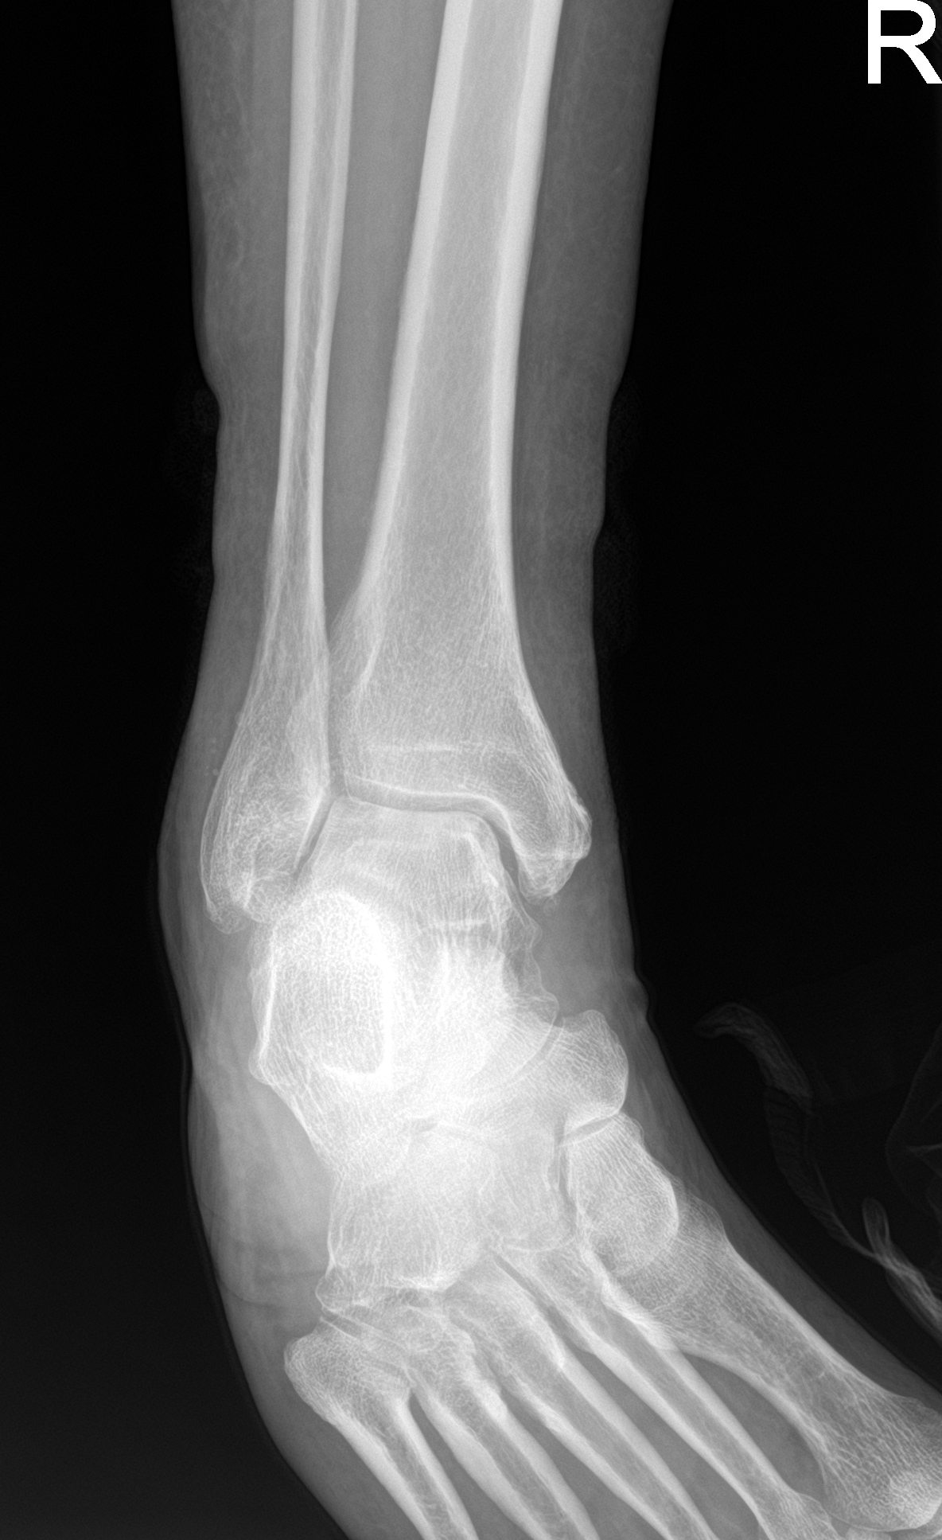

[ankle lat]
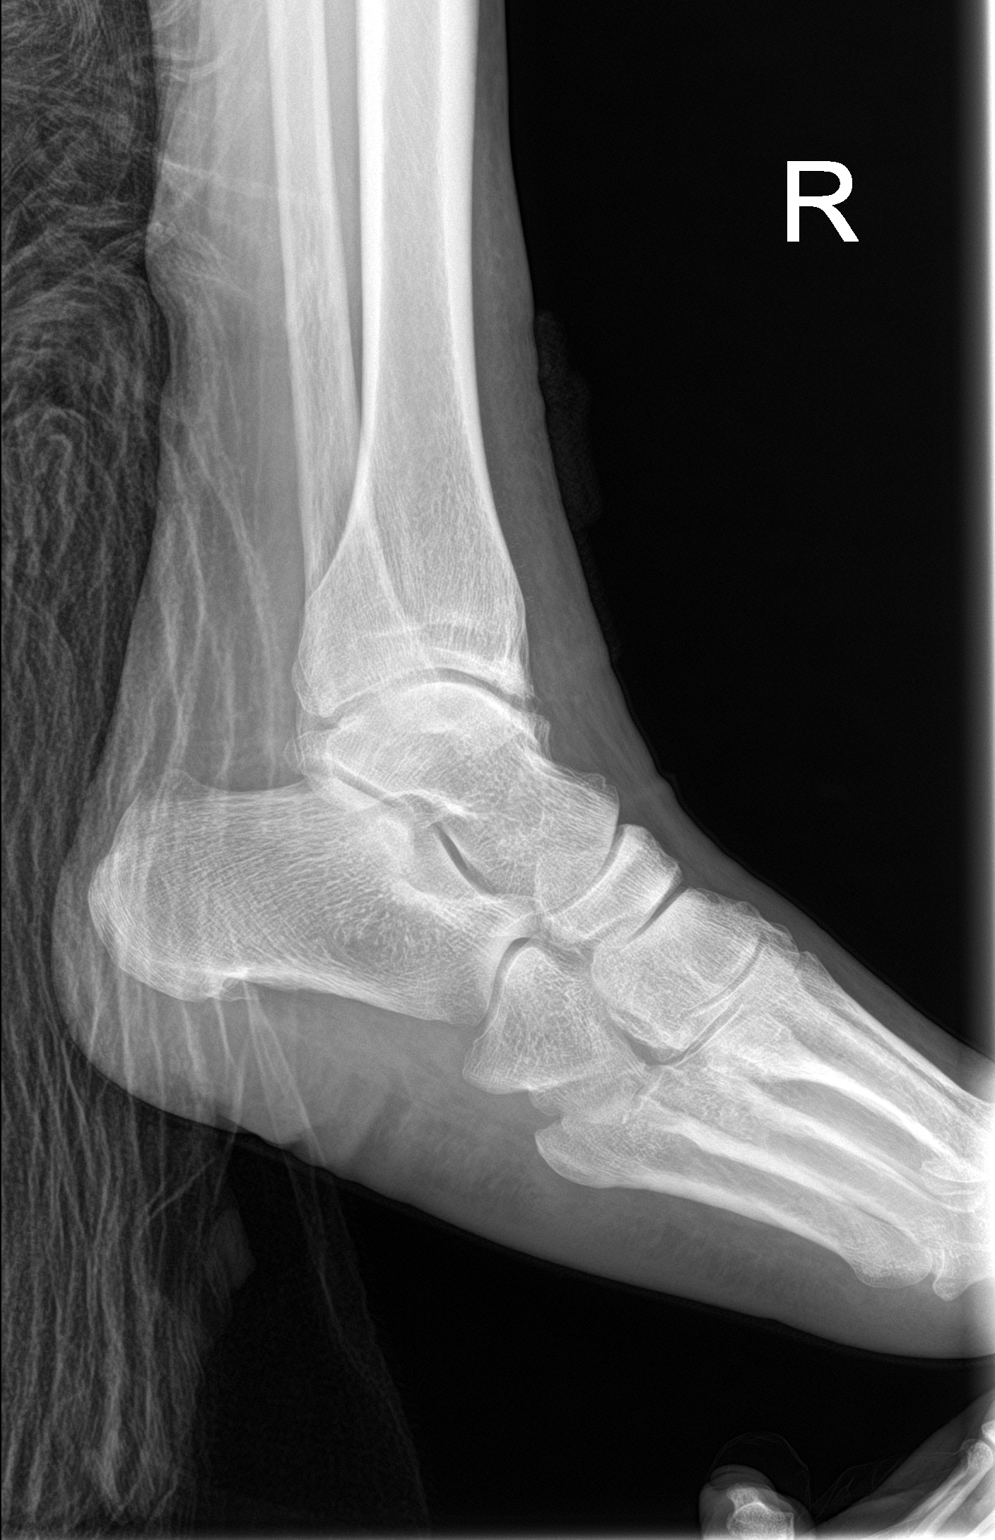

[ankle ap]
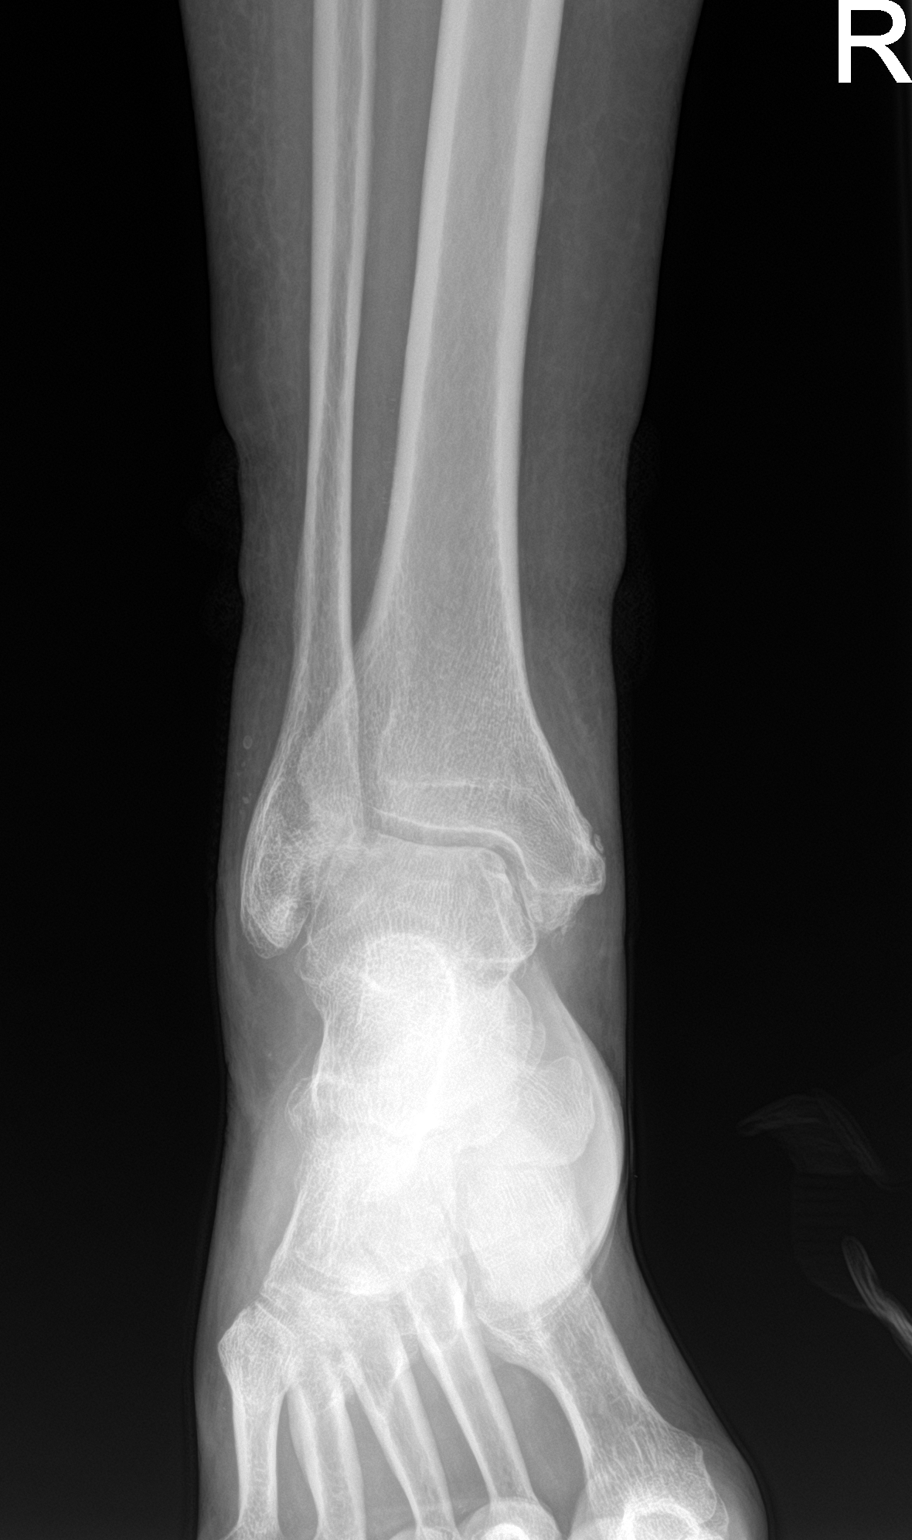

[3 of 3 positions shown; findings below may reference images not displayed]

FINDINGS: Mild tarsal degenerative changes are seen. Tibiotalar degenerative
changes noted. No acute fracture or dislocation is seen. No soft
tissue abnormality is noted.
IMPRESSION: Tarsal degenerative changes without acute bony abnormality.
